# Patient Record
Sex: Female | Born: 1950
Health system: Southern US, Community
[De-identification: ages and names within clinical notes are randomized; demographics above are authoritative.]

## PROBLEM LIST (undated history)

## (undated) DIAGNOSIS — T8859XA Other complications of anesthesia, initial encounter: Secondary | ICD-10-CM

## (undated) DIAGNOSIS — Z9889 Other specified postprocedural states: Secondary | ICD-10-CM

## (undated) DIAGNOSIS — Z8489 Family history of other specified conditions: Secondary | ICD-10-CM

## (undated) DIAGNOSIS — T4145XA Adverse effect of unspecified anesthetic, initial encounter: Secondary | ICD-10-CM

## (undated) DIAGNOSIS — E079 Disorder of thyroid, unspecified: Secondary | ICD-10-CM

## (undated) DIAGNOSIS — I341 Nonrheumatic mitral (valve) prolapse: Secondary | ICD-10-CM

## (undated) DIAGNOSIS — R112 Nausea with vomiting, unspecified: Secondary | ICD-10-CM

## (undated) DIAGNOSIS — Z953 Presence of xenogenic heart valve: Secondary | ICD-10-CM

## (undated) DIAGNOSIS — D591 Other autoimmune hemolytic anemias: Secondary | ICD-10-CM

## (undated) DIAGNOSIS — J449 Chronic obstructive pulmonary disease, unspecified: Secondary | ICD-10-CM

## (undated) DIAGNOSIS — E039 Hypothyroidism, unspecified: Secondary | ICD-10-CM

## (undated) DIAGNOSIS — R011 Cardiac murmur, unspecified: Secondary | ICD-10-CM

## (undated) DIAGNOSIS — A64 Unspecified sexually transmitted disease: Secondary | ICD-10-CM

## (undated) DIAGNOSIS — T7840XA Allergy, unspecified, initial encounter: Secondary | ICD-10-CM

## (undated) DIAGNOSIS — I781 Nevus, non-neoplastic: Secondary | ICD-10-CM

## (undated) DIAGNOSIS — C3490 Malignant neoplasm of unspecified part of unspecified bronchus or lung: Secondary | ICD-10-CM

## (undated) DIAGNOSIS — J45909 Unspecified asthma, uncomplicated: Secondary | ICD-10-CM

## (undated) DIAGNOSIS — I7300 Raynaud's syndrome without gangrene: Secondary | ICD-10-CM

## (undated) DIAGNOSIS — IMO0001 Reserved for inherently not codable concepts without codable children: Secondary | ICD-10-CM

## (undated) DIAGNOSIS — I5032 Chronic diastolic (congestive) heart failure: Secondary | ICD-10-CM

## (undated) DIAGNOSIS — I340 Nonrheumatic mitral (valve) insufficiency: Secondary | ICD-10-CM

## (undated) DIAGNOSIS — I6529 Occlusion and stenosis of unspecified carotid artery: Secondary | ICD-10-CM

## (undated) HISTORY — DX: Nonrheumatic mitral (valve) insufficiency: I34.0

## (undated) HISTORY — DX: Unspecified asthma, uncomplicated: J45.909

## (undated) HISTORY — DX: Malignant neoplasm of unspecified part of unspecified bronchus or lung: C34.90

## (undated) HISTORY — DX: Cardiac murmur, unspecified: R01.1

## (undated) HISTORY — DX: Disorder of thyroid, unspecified: E07.9

## (undated) HISTORY — DX: Allergy, unspecified, initial encounter: T78.40XA

## (undated) HISTORY — DX: Unspecified sexually transmitted disease: A64

## (undated) HISTORY — PX: LUNG CANCER SURGERY: SHX702

## (undated) HISTORY — PX: BREAST BIOPSY: SHX20

## (undated) HISTORY — PX: TONSILLECTOMY: SUR1361

## (undated) HISTORY — DX: Occlusion and stenosis of unspecified carotid artery: I65.29

## (undated) HISTORY — DX: Raynaud's syndrome without gangrene: I73.00

## (undated) HISTORY — DX: Nonrheumatic mitral (valve) prolapse: I34.1

## (undated) HISTORY — PX: BACK SURGERY: SHX140

## (undated) HISTORY — PX: LAPAROSCOPY: SHX197

---

## 2011-05-21 HISTORY — PX: CLAVICLE SURGERY: SHX598

## 2013-05-20 HISTORY — PX: OTHER SURGICAL HISTORY: SHX169

## 2014-08-03 ENCOUNTER — Encounter (HOSPITAL_COMMUNITY): Payer: Self-pay | Admitting: *Deleted

## 2014-08-03 ENCOUNTER — Emergency Department (INDEPENDENT_AMBULATORY_CARE_PROVIDER_SITE_OTHER)
Admission: EM | Admit: 2014-08-03 | Discharge: 2014-08-03 | Disposition: A | Discharge status: Home / Self Care | Payer: PRIVATE HEALTH INSURANCE | Source: Home / Self Care | Attending: Family Medicine | Admitting: Family Medicine

## 2014-08-03 DIAGNOSIS — Z20828 Contact with and (suspected) exposure to other viral communicable diseases: Secondary | ICD-10-CM

## 2014-08-03 DIAGNOSIS — I889 Nonspecific lymphadenitis, unspecified: Secondary | ICD-10-CM

## 2014-08-03 NOTE — ED Notes (Signed)
Pt  Reports    Symptoms   Of        Swelling  r   Side  Neck  Tender   Behind    r  Ear      -  denys  Any  sorethroat         denys  Any  Injury    -   Pt  Is  Sitting   Upright on  Exam table  Speaking in  Complete  sentances       And     Is  In     No  Severe   Distress

## 2014-08-03 NOTE — Discharge Instructions (Signed)
Continue to apply ice packs and take ibuprofen as needed for pain. If this gets worse or if you start to get sick, or does not resolve in a month, follow-up here or with your primary care provider  Cervical Adenitis You have a swollen lymph gland in your neck. This commonly happens with Strep and virus infections, dental problems, insect bites, and injuries about the face, scalp, or neck. The lymph glands swell as the body fights the infection or heals the injury. Swelling and firmness typically lasts for several weeks after the infection or injury is healed. Rarely lymph glands can become swollen because of cancer or TB. Antibiotics are prescribed if there is evidence of an infection. Sometimes an infected lymph gland becomes filled with pus. This condition may require opening up the abscessed gland by draining it surgically. Most of the time infected glands return to normal within two weeks. Do not poke or squeeze the swollen lymph nodes. That may keep them from shrinking back to their normal size. If the lymph gland is still swollen after 2 weeks, further medical evaluation is needed.  SEEK IMMEDIATE MEDICAL CARE IF:  You have difficulty swallowing or breathing, increased swelling, severe pain, or a high fever.  Document Released: 05/06/2005 Document Revised: 07/29/2011 Document Reviewed: 10/26/2006 [facility] Patient Information 2015 [place], [place]. This information is not intended to replace advice given to you by your health care provider. Make sure you discuss any questions you have with your health care provider.

## 2014-08-03 NOTE — ED Provider Notes (Signed)
CSN: [id number]     Arrival date & time 08/03/14  1253 History   First MD Initiated Contact with Patient 08/03/14 1429     Chief Complaint  Patient presents with  . Lymphadenopathy   (Consider location/radiation/quality/duration/timing/severity/associated sxs/prior Treatment) HPI      64 year old female presents complaining of a tender swollen lymph node in the right side of her neck. This started yesterday. It was very large, she says it was about the size of a cough off and extremely painful. She applied an ice pack and has gotten better today. She denies any systemic symptoms. She has recent exposure to her granddaughter who has influenza B. She denies fever, chills, NVD, trismus. No systemic symptoms whatsoever  Past Medical History  Diagnosis Date  . CREST (calcinosis, Raynaud's phenomenon, esophageal dysfunction, sclerodactyly, telangiectasia)    Past Surgical History  Procedure Laterality Date  . Back surgery     History reviewed. No pertinent family history. History  Substance Use Topics  . Smoking status: Current Every Day Smoker  . Smokeless tobacco: Not on file  . Alcohol Use: Yes   OB History    No data available     Review of Systems  Hematological: Positive for adenopathy.  All other systems reviewed and are negative.   Allergies  Penicillins  Home Medications   Prior to Admission medications   Medication Sig Start Date End Date Taking? Authorizing Provider  BABY ASPIRIN PO Take by mouth.   Yes Historical Provider, MD  Levothyroxine Sodium (LEVOTHROID PO) Take by mouth.   Yes Historical Provider, MD  Multiple Vitamins-Minerals (MULTI VITAMIN/MINERALS PO) Take by mouth.   Yes Historical Provider, MD  Omega-3 Fatty Acids (FISH OIL PO) Take by mouth.   Yes Historical Provider, MD  Tiotropium Bromide Monohydrate (SPIRIVA HANDIHALER IN) Inhale into the lungs.   Yes Historical Provider, MD   BP 133/70 mmHg  Pulse 73  Temp(Src) 97.4 F (36.3 C) (Oral)  Resp  16  SpO2 100% Physical Exam  Constitutional: She is oriented to person, place, and time. Vital signs are normal. She appears well-developed and well-nourished. No distress.  HENT:  Head: Normocephalic and atraumatic.  Right Ear: Tympanic membrane, external ear and ear canal normal.  Left Ear: Tympanic membrane, external ear and ear canal normal.  Nose: Nose normal. Right sinus exhibits no maxillary sinus tenderness and no frontal sinus tenderness. Left sinus exhibits no maxillary sinus tenderness and no frontal sinus tenderness.  Mouth/Throat: Oropharynx is clear and moist. No oropharyngeal exudate.  Pulmonary/Chest: Effort normal. No respiratory distress.  Lymphadenopathy:       Head (right side): Tonsillar adenopathy present. No preauricular and no posterior auricular adenopathy present.       Head (left side): No tonsillar, no preauricular and no posterior auricular adenopathy present.    She has cervical adenopathy.       Right cervical: No superficial cervical adenopathy present.      Left cervical: No superficial cervical and no deep cervical adenopathy present.  No mastoid tenderness  Neurological: She is alert and oriented to person, place, and time. She has normal strength. Coordination normal.  Skin: Skin is warm and dry. No rash noted. She is not diaphoretic.  Psychiatric: She has a normal mood and affect. Judgment normal.  Nursing note and vitals reviewed.   ED Course  Procedures (including critical care time) Labs Review Labs Reviewed - No data to display  Imaging Review No results found.   MDM   1. Cervical  lymphadenitis   2. Exposure to influenza    Isolated lymphadenopathy, advised continue treatment with ice as well as ibuprofen, return precautions were discussed. No antibiotics indicated at this time.    Liam Graham, PA-C 08/03/14 1455

## 2014-09-27 ENCOUNTER — Encounter: Payer: Self-pay | Admitting: Internal Medicine

## 2014-10-03 ENCOUNTER — Ambulatory Visit (INDEPENDENT_AMBULATORY_CARE_PROVIDER_SITE_OTHER)
Admission: RE | Admit: 2014-10-03 | Discharge: 2014-10-03 | Disposition: A | Discharge status: Home / Self Care | Payer: PRIVATE HEALTH INSURANCE | Source: Ambulatory Visit | Attending: Internal Medicine | Admitting: Internal Medicine

## 2014-10-03 ENCOUNTER — Encounter: Payer: Self-pay | Admitting: Internal Medicine

## 2014-10-03 ENCOUNTER — Ambulatory Visit (INDEPENDENT_AMBULATORY_CARE_PROVIDER_SITE_OTHER): Payer: PRIVATE HEALTH INSURANCE | Admitting: Internal Medicine

## 2014-10-03 VITALS — BP 92/60 | HR 76 | Ht 66.5 in | Wt 128.0 lb

## 2014-10-03 DIAGNOSIS — Z87891 Personal history of nicotine dependence: Secondary | ICD-10-CM | POA: Insufficient documentation

## 2014-10-03 DIAGNOSIS — M349 Systemic sclerosis, unspecified: Secondary | ICD-10-CM

## 2014-10-03 DIAGNOSIS — R918 Other nonspecific abnormal finding of lung field: Secondary | ICD-10-CM | POA: Diagnosis not present

## 2014-10-03 DIAGNOSIS — F17210 Nicotine dependence, cigarettes, uncomplicated: Secondary | ICD-10-CM

## 2014-10-03 DIAGNOSIS — Z720 Tobacco use: Secondary | ICD-10-CM

## 2014-10-03 DIAGNOSIS — J449 Chronic obstructive pulmonary disease, unspecified: Secondary | ICD-10-CM

## 2014-10-03 MED ORDER — MOMETASONE FURO-FORMOTEROL FUM 100-5 MCG/ACT IN AERO: INHALATION_SPRAY | RESPIRATORY_TRACT | Status: DC

## 2014-10-03 NOTE — Assessment & Plan Note (Addendum)
-   pfts 2014 s sign obst or even concavity to f/v loop   Symptoms are more of cough than doe at this point and not responding to maint spiriva  DDX of  difficult airways management all start with A and  include Adherence, Ace Inhibitors, Acid Reflux, Active Sinus Disease, Alpha 1 Antitripsin deficiency, Anxiety masquerading as Airways dz,  ABPA,  allergy(esp in young), Aspiration (esp in elderly), Adverse effects of DPI,  Active smokers, plus two Bs  = Bronchiectasis and Beta blocker use..and one C= CHF   Adherence is always the initial "prime suspect" and is a multilayered concern that requires a "trust but verify" approach in every patient - starting with knowing how to use medications, especially inhalers, correctly, keeping up with refills and understanding the fundamental difference between maintenance and prns vs those medications only taken for a very short course and then stopped and not refilled.  - The proper method of use, as well as anticipated side effects, of a metered-dose inhaler are discussed and demonstrated to the patient. Improved effectiveness after extensive coaching during this visit to a level of approximately  90% so try dulera 100 2bid  ? Acid (or non-acid) GERD > always difficult to exclude as up to 75% of pts in some series report no assoc GI/ Heartburn symptoms and she supposedly has CREST on fish oil which I asked her to reconsider  ? Adverse effects of spiriva > tyr off this since she doesn't have copd and on dulera 100 since she does have active bronchitis

## 2014-10-03 NOTE — Assessment & Plan Note (Signed)
>   3 min discussion I reviewed the Flethcher curve with patient that basically indicates  if you quit smoking when your best day FEV1 is still well preserved (as clearly is the case here)  it is highly unlikely you will progress to severe disease and informed the patient there was no medication on the market that has proven to change the curve or the likelihood of progression.  Therefore stopping smoking and maintaining abstinence is the most important aspect of care, not choice of inhalers or for that matter, doctors.

## 2014-10-03 NOTE — Assessment & Plan Note (Signed)
Referred to rheumatology

## 2014-10-03 NOTE — Patient Instructions (Addendum)
Stop spiriva and start >>> Dulera 100 Take 2 puffs first thing in am and then another 2 puffs about 12 hours later.  Fill the prescription if you like it, if not try off and see what difference it makes and if prefer spriva restart vs just work on smoking cessation and stop all the inhalers  Work on inhaler technique:  relax and gently blow all the way out then take a nice smooth deep breath back in, triggering the inhaler at same time you start breathing in.  Hold for up to 5 seconds if you can.  Rinse and gargle with water when done  Please see patient coordinator before you leave today  to schedule rheumatology evaluation   Please remember to go to the   x-ray department downstairs for your tests - we will call you with the results when they are available.    Please schedule a follow up office visit in 6 weeks, call sooner if needed with pft's on return

## 2014-10-03 NOTE — Progress Notes (Signed)
Subjective:    Patient ID: Joy Williams, female    DOB: 1950/11/06    MRN: [id number]  HPI  11 yowm active smoker dx with CREST 2004 in [place] moved to [facility] from [place] Nov 2015  self referred to pulmonary clinic for copd though last set of pfts from 2014 did not show copd but CT chest 12/2010 showed MPN's with neg PET    10/03/2014 1st [facility] office visit/ Eylin Pontarelli  On spiriva chronically  Chief Complaint  Patient presents with  . Pulmonary Consult    Self referral. Pt moved from [place] to [facility] in Nov 2015. She states that she was dxed with Crest Syndrome in her 67's or 30's.  She c/o "smoker's cough"- proc with clear sputum.  Her breathing is overall doing well.    cough is the main problem - can still do 5k racing power walk s stopping ie Not limited by breathing from desired activities  Takes fish oil for heart regurgitation and it "works great"    Cough is daily , def Worse in am's year round, present x > 5 y, worse since in [place]   No obvious other patterns in day to day or daytime variabilty or assoc cp or chest tightness, subjective wheeze overt sinus or hb symptoms. No unusual exp hx or h/o childhood pna/ asthma or knowledge of premature birth.  Sleeping ok without nocturnal  or early am exacerbation  of respiratory  c/o's or need for noct saba. Also denies any obvious fluctuation of symptoms with weather or environmental changes or other aggravating or alleviating factors except as outlined above   Current Medications, Allergies, Complete Past Medical History, Past Surgical History, Family History, and Social History were reviewed in [employer] record.             Review of Systems  Constitutional: Negative for fever, chills and unexpected weight change.  HENT: Negative for congestion, dental problem, ear pain, nosebleeds, postnasal drip, rhinorrhea, sinus pressure, sneezing, sore throat, trouble swallowing and voice change.   Eyes: Negative for  visual disturbance.  Respiratory: Positive for cough. Negative for choking and shortness of breath.   Cardiovascular: Negative for chest pain and leg swelling.  Gastrointestinal: Negative for vomiting, abdominal pain and diarrhea.  Genitourinary: Negative for difficulty urinating.  Musculoskeletal: Negative for arthralgias.  Skin: Negative for rash.  Neurological: Negative for tremors, syncope and headaches.  Hematological: Does not bruise/bleed easily.       Objective:   Physical Exam  amb thin wf nad  Wt Readings from Last 3 Encounters:  10/03/14 128 lb (58.06 kg)    Vital signs reviewed   HEENT: nl dentition, turbinates, and orophanx. Nl external ear canals without cough reflex   NECK :  without JVD/Nodes/TM/ nl carotid upstrokes bilaterally   LUNGS: no acc muscle use, clear to A and P bilaterally with  cough on  exp maneuvers   CV:  RRR  no s3 or murmur or increase in P2, no edema   ABD:  soft and nontender with nl excursion in the supine position. No bruits or organomegaly, bowel sounds nl  MS:  warm without deformities, calf tenderness, cyanosis or clubbing  SKIN: warm and dry with classic telangectasias esp face and volar surfaces both hands  NEURO:  alert, approp, no deficits     CXR PA and Lateral:   10/03/2014 :     I personally reviewed images and agree with radiology impression as follows:  Ill-defined stellate density noted projected over the left upper lobe. Question small peripheral nodule over the left mid lung. Contrast-enhanced chest CT is suggested for further evaluation.     Assessment & Plan:

## 2014-10-03 NOTE — Assessment & Plan Note (Signed)
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

## 2014-10-26 ENCOUNTER — Encounter: Payer: Self-pay | Admitting: *Deleted

## 2014-10-28 ENCOUNTER — Encounter: Payer: Self-pay | Admitting: Cardiology

## 2014-10-28 ENCOUNTER — Ambulatory Visit (INDEPENDENT_AMBULATORY_CARE_PROVIDER_SITE_OTHER): Payer: PRIVATE HEALTH INSURANCE | Admitting: Cardiology

## 2014-10-28 VITALS — BP 124/64 | HR 71 | Ht 66.5 in | Wt 125.0 lb

## 2014-10-28 DIAGNOSIS — Z720 Tobacco use: Secondary | ICD-10-CM

## 2014-10-28 DIAGNOSIS — I341 Nonrheumatic mitral (valve) prolapse: Secondary | ICD-10-CM | POA: Diagnosis not present

## 2014-10-28 DIAGNOSIS — I340 Nonrheumatic mitral (valve) insufficiency: Secondary | ICD-10-CM | POA: Diagnosis not present

## 2014-10-28 DIAGNOSIS — F17200 Nicotine dependence, unspecified, uncomplicated: Secondary | ICD-10-CM

## 2014-10-28 LAB — BASIC METABOLIC PANEL
BUN: 13 mg/dL (ref 6–23)
CO2: 30 mEq/L (ref 19–32)
Calcium: 9.8 mg/dL (ref 8.4–10.5)
Chloride: 100 mEq/L (ref 96–112)
Creatinine, Ser: 0.72 mg/dL (ref 0.40–1.20)
GFR: 86.76 mL/min (ref >60.00)
Glucose, Bld: 83 mg/dL (ref 70–99)
Potassium: 4.2 mEq/L (ref 3.5–5.1)
Sodium: 136 mEq/L (ref 135–145)

## 2014-10-28 LAB — CBC
HCT: 37.7 % (ref 36.0–46.0)
Hemoglobin: 12.4 g/dL (ref 12.0–15.0)
MCHC: 33 g/dL (ref 30.0–36.0)
MCV: 86.3 fl (ref 78.0–100.0)
Platelets: 297 10*3/uL (ref 150.0–400.0)
RBC: 4.36 Mil/uL (ref 3.87–5.11)
RDW: 13.6 % (ref 11.5–15.5)
WBC: 10.5 10*3/uL (ref 4.0–10.5)

## 2014-10-28 LAB — HEPATIC FUNCTION PANEL
ALT: 14 U/L (ref 0–35)
AST: 24 U/L (ref 0–37)
Albumin: 4.1 g/dL (ref 3.5–5.2)
Alkaline Phosphatase: 60 U/L (ref 39–117)
Bilirubin, Direct: 0 mg/dL (ref 0.0–0.3)
Total Bilirubin: 0.4 mg/dL (ref 0.2–1.2)
Total Protein: 7.2 g/dL (ref 6.0–8.3)

## 2014-10-28 LAB — TSH: TSH: 11.94 u[IU]/mL — ABNORMAL HIGH (ref 0.35–4.50)

## 2014-10-28 NOTE — Patient Instructions (Signed)
Medication Instructions:  None  Labwork: BMET, CBC, TSH, LFT and NMR Lipids today  Testing/Procedures: Your physician has requested that you have an echocardiogram. Echocardiography is a painless test that uses sound waves to create images of your heart. It provides your doctor with information about the size and shape of your heart and how well your heart's chambers and valves are working. This procedure takes approximately one hour. There are no restrictions for this procedure.    Follow-Up: Your physician wants you to follow-up in: 1 year with Dr. Meda Coffee. You will receive a reminder letter in the mail two months in advance. If you don't receive a letter, please call our office to schedule the follow-up appointment.   Any Other Special Instructions Will Be Listed Below (If Applicable).

## 2014-10-28 NOTE — Progress Notes (Signed)
Patient ID: Joy Williams, female   DOB: April 09, 1951, 64 y.o.   MRN: [id number]      Cardiology Office Note   Date:  10/28/2014   ID:  Joy Williams, DOB 04/22/1951, MRN [id number]  PCP:  Velna Hatchet, MD  Cardiologist:  Dorothy Spark, MD   Chief complain: mitral valve prolapse, severe mitral regurgitation   History of Present Illness: Joy Williams is a 64 y.o. female who presents to establish cardiology care. The patient moved here from [place]. She has h/o lifelong MVP and was told that she has severe MR but surgery is not indicated yet. He mother, brother and sister all had MVP and underwent MV surgery.  The patient is not exercising, but is able to perform all the ADL and has full time job that she is performing without any difficulties.  She denies palpitations, syncope, orthopnea, PND, LE edema, DOE, or chest pain.  She hasn't had her cholesterol checked in a while.   Past Medical History  Diagnosis Date  . CREST (calcinosis, Raynaud's phenomenon, esophageal dysfunction, sclerodactyly, telangiectasia)     Past Surgical History  Procedure Laterality Date  . Back surgery       Current Outpatient Prescriptions  Medication Sig Dispense Refill  . BABY ASPIRIN PO Take 1 tablet by mouth daily.     Marland Kitchen levothyroxine (SYNTHROID, LEVOTHROID) 175 MCG tablet Take 175 mcg by mouth daily before breakfast.    . mometasone-formoterol (DULERA) 100-5 MCG/ACT AERO Take 2 puffs first thing in am and then another 2 puffs about 12 hours later. 1 Inhaler 11  . Multiple Vitamins-Minerals (MULTI VITAMIN/MINERALS PO) Take 1 tablet by mouth daily.     . Omega-3 Fatty Acids (FISH OIL PO) Take 1 capsule by mouth daily.      No current facility-administered medications for this visit.   Allergies:   Penicillins   Social History:  The patient  reports that she has been smoking Cigarettes.  She has a 18 pack-year smoking history. She has never used smokeless tobacco. She reports that she  drinks alcohol.   Family History:  The patient's family history includes Dementia in her father; Mitral valve prolapse in her brother, mother, and sister; Prostate cancer in her father.    ROS:  Please see the history of present illness.     All other systems are reviewed and negative.    PHYSICAL EXAM: VS:  BP 124/64 mmHg  Pulse 71  Ht 5' 6.5" (1.689 m)  Wt 125 lb (56.7 kg)  BMI 19.88 kg/m2 , BMI Body mass index is 19.88 kg/(m^2). GEN: Well nourished, well developed, in no acute distress HEENT: normal Neck: no JVD, carotid bruits, or masses Cardiac: RRR; early systolic click followed by a loud murmur, rubs, or gallops,no edema  Respiratory:  clear to auscultation bilaterally, normal work of breathing GI: soft, nontender, nondistended, + BS MS: no deformity or atrophy Skin: warm and dry, no rash Neuro:  Strength and sensation are intact Psych: euthymic mood, full affect  EKG:  EKG: SR, normal ECG  Recent Labs: No results found for requested labs within last 365 days.   Lipid Panel No results found for: CHOL, TRIG, HDL, CHOLHDL, VLDL, LDLCALC, LDLDIRECT    Wt Readings from Last 3 Encounters:  10/28/14 125 lb (56.7 kg)  10/03/14 128 lb (58.06 kg)      Other studies Reviewed: Records requested  ASSESSMENT AND PLAN:  64 year old female  1. MVP, severe MR - we will perform TTE  to have a baseline study and to evaluate for LV size, LVEF< possible pulmonary hypertension. The patient is currently asymptomatic and if she has normal parameters, we will follow in 1 year.  2. Smoking - cessation counseling provided  3. Lipids - we will check today  Follow up in 1 year if no indication for MV surgery.  Signed, Dorothy Spark, MD  10/28/2014 9:40 AM    [facility] [place], [place], [place]  [address] Phone: [phone]; Fax: [phone]

## 2014-10-31 LAB — NMR LIPOPROFILE WITH LIPIDS
Cholesterol, Total: 177 mg/dL (ref 100–199)
HDL Particle Number: 35.9 umol/L (ref >=30.5)
HDL Size: 9.9 nm (ref >=9.2)
HDL-C: 70 mg/dL (ref >39)
LDL (calc): 84 mg/dL (ref 0–99)
LDL Particle Number: 1032 nmol/L — ABNORMAL HIGH (ref <1000)
LDL Size: 20.7 nm (ref >=20.8)
LP-IR Score: 26 (ref <=45)
Large HDL-P: 13.4 umol/L (ref >=4.8)
Large VLDL-P: 2.4 nmol/L (ref <=2.7)
Small LDL Particle Number: 334 nmol/L (ref <=527)
Triglycerides: 115 mg/dL (ref 0–149)
VLDL Size: 45.2 nm (ref <=46.6)

## 2014-11-04 ENCOUNTER — Other Ambulatory Visit: Payer: Self-pay

## 2014-11-04 ENCOUNTER — Ambulatory Visit (HOSPITAL_COMMUNITY): Payer: No Typology Code available for payment source | Attending: Cardiovascular Disease

## 2014-11-04 DIAGNOSIS — I341 Nonrheumatic mitral (valve) prolapse: Secondary | ICD-10-CM | POA: Diagnosis present

## 2014-11-04 DIAGNOSIS — I340 Nonrheumatic mitral (valve) insufficiency: Secondary | ICD-10-CM

## 2014-11-04 DIAGNOSIS — I517 Cardiomegaly: Secondary | ICD-10-CM | POA: Diagnosis not present

## 2014-11-11 ENCOUNTER — Telehealth: Payer: Self-pay | Admitting: Cardiology

## 2014-11-11 NOTE — Telephone Encounter (Signed)
New message      Pt called regarding test results.  Please advise.

## 2014-11-11 NOTE — Telephone Encounter (Signed)
Informed the pt that Dr Meda Coffee has not resulted her echo results yet,but this is in her basket for review and interpretation.  Informed the pt that once Dr Meda Coffee reviews her study, she will send this to me to follow-up with the pt to inform her of her results.  Pt verbalized understanding and agrees with this plan.

## 2014-11-11 NOTE — Telephone Encounter (Signed)
Please let her know that I will call her on Monday, she has severe MR, but I haven't had chance to talk to surgeon about her case yet.

## 2014-11-11 NOTE — Telephone Encounter (Signed)
Informed the pt that per Dr Meda Coffee she wanted me to let her know that she will call her on Monday 6/27 and go over more in depth her echo results and further recommendations. Informed the pt that per Dr Meda Coffee she has severe MR.  Provided brief pt education on what MR is.  Pt verbalized understanding and agrees with this plan.

## 2014-11-14 ENCOUNTER — Ambulatory Visit (INDEPENDENT_AMBULATORY_CARE_PROVIDER_SITE_OTHER)
Admission: RE | Admit: 2014-11-14 | Discharge: 2014-11-14 | Disposition: A | Discharge status: Home / Self Care | Payer: PRIVATE HEALTH INSURANCE | Source: Ambulatory Visit | Attending: Internal Medicine | Admitting: Internal Medicine

## 2014-11-14 ENCOUNTER — Encounter: Payer: Self-pay | Admitting: Internal Medicine

## 2014-11-14 ENCOUNTER — Ambulatory Visit (INDEPENDENT_AMBULATORY_CARE_PROVIDER_SITE_OTHER): Payer: PRIVATE HEALTH INSURANCE | Admitting: Internal Medicine

## 2014-11-14 ENCOUNTER — Telehealth: Payer: Self-pay | Admitting: Internal Medicine

## 2014-11-14 VITALS — BP 94/60 | HR 66 | Ht 66.5 in | Wt 127.0 lb

## 2014-11-14 DIAGNOSIS — M349 Systemic sclerosis, unspecified: Secondary | ICD-10-CM | POA: Diagnosis not present

## 2014-11-14 DIAGNOSIS — R918 Other nonspecific abnormal finding of lung field: Secondary | ICD-10-CM

## 2014-11-14 DIAGNOSIS — J449 Chronic obstructive pulmonary disease, unspecified: Secondary | ICD-10-CM | POA: Diagnosis not present

## 2014-11-14 LAB — PULMONARY FUNCTION TEST
DL/VA % pred: 65 %
DL/VA: 3.35 ml/min/mmHg/L
DLCO unc % pred: 52 %
DLCO unc: 14.61 ml/min/mmHg
FEF 25-75 PRE: 2.13 L/s
FEF 25-75 Post: 2.1 L/sec
FEF2575-%Change-Post: -1 %
FEF2575-%Pred-Post: 90 %
FEF2575-%Pred-Pre: 91 %
FEV1-%CHANGE-POST: 0 %
FEV1-%PRED-POST: 86 %
FEV1-%Pred-Pre: 86 %
FEV1-Post: 2.33 L
FEV1-Pre: 2.33 L
FEV1FVC-%Change-Post: -1 %
FEV1FVC-%Pred-Pre: 101 %
FEV6-%Change-Post: 1 %
FEV6-%PRED-PRE: 87 %
FEV6-%Pred-Post: 88 %
FEV6-POST: 2.98 L
FEV6-PRE: 2.95 L
FEV6FVC-%CHANGE-POST: 0 %
FEV6FVC-%Pred-Post: 103 %
FEV6FVC-%Pred-Pre: 104 %
FVC-%Change-Post: 0 %
FVC-%PRED-PRE: 84 %
FVC-%Pred-Post: 85 %
FVC-PRE: 2.97 L
FVC-Post: 3 L
POST FEV6/FVC RATIO: 100 %
PRE FEV6/FVC RATIO: 100 %
Post FEV1/FVC ratio: 78 %
Pre FEV1/FVC ratio: 78 %
RV % PRED: 88 %
RV: 1.93 L
TLC % PRED: 89 %
TLC: 4.88 L

## 2014-11-14 NOTE — Progress Notes (Signed)
PFT done today. 

## 2014-11-14 NOTE — Progress Notes (Signed)
Subjective:    Patient ID: Joy Williams, female    DOB: 12/25/1950    MRN: [id number]    Brief patient profile:  23 yowm active smoker dx with CREST 2004 in [place] moved to [place] from [place] Nov 2015  self referred to pulmonary clinic 10/03/14  for copd though last set of pfts from 2014 did not show copd but CT chest 12/2010 showed MPN's with neg PET at that point     History of Present Illness  10/03/2014 1st [facility] office visit/ Geraldo Haris  On spiriva chronically  Chief Complaint  Patient presents with  . Pulmonary Consult    Self referral. Pt moved from [place] to [facility] in Nov 2015. 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  She c/o "smoker's cough"- proc with clear sputum.  Her breathing is overall doing well.    cough is the main problem - can still do 5k racing power walk s stopping ie Not limited by breathing from desired activities  Takes fish oil for heart regurgitation and it "works great"  Cough is daily , def Worse in am's year round, present x > 5 y, worse since in [place]  rec Stop spiriva and start >>> Dulera 100 Take 2 puffs first thing in am and then another 2 puffs about 12 hours later.  Fill the prescription if you like it, if not try off and see what difference it makes and if prefer spriva restart vs just work on smoking cessation and stop all the inhalers Work on inhaler technique:   Please see patient coordinator before you leave today  to schedule rheumatology evaluation > done tr[place]low    11/14/2014 f/u ov/Haruki Arnold re: GOLD 0 copd/ dulera 100 > breathing/cough much better than spirva Chief Complaint  Patient presents with  . Follow-up    PFT done today. Pt states that her cough has improved and breathing is doing better. No new co's today.   can now walk fast up hills s stopping but does get sob then   No obvious day to day or daytime variabilty or assoc chronic cough or cp or chest tightness, subjective wheeze overt sinus or hb symptoms. No  unusual exp hx or h/o childhood pna/ asthma or knowledge of premature birth.  Sleeping ok without nocturnal  or early am exacerbation  of respiratory  c/o's or need for noct saba. Also denies any obvious fluctuation of symptoms with weather or environmental changes or other aggravating or alleviating factors except as outlined above   Current Medications, Allergies, Complete Past Medical History, Past Surgical History, Family History, and Social History were reviewed in [employer] record.  ROS  The following are not active complaints unless bolded sore throat, dysphagia, dental problems, itching, sneezing,  nasal congestion or excess/ purulent secretions, ear ache,   fever, chills, sweats, unintended wt loss, pleuritic or exertional cp, hemoptysis,  orthopnea pnd or leg swelling, presyncope, palpitations, abdominal pain, anorexia, nausea, vomiting, diarrhea  or change in bowel or urinary habits, change in stools or urine, dysuria,hematuria,  rash, arthralgias, visual complaints, headache, numbness weakness or ataxia or problems with walking or coordination,  change in mood/affect or memory.              Objective:   Physical Exam  amb thin wf nad  Wt Readings from Last 3 Encounters:  11/14/14 127 lb (57.607 kg)  10/28/14 125 lb (56.7 kg)  10/03/14 128 lb (58.06 kg)  Vital signs reviewed   HEENT: nl dentition, turbinates, and orophanx. Nl external ear canals without cough reflex   NECK :  without JVD/Nodes/TM/ nl carotid upstrokes bilaterally   LUNGS: no acc muscle use, clear to A and P bilaterally     CV:  RRR  no s3 or murmur or increase in P2, no edema   ABD:  soft and nontender with nl excursion in the supine position. No bruits or organomegaly, bowel sounds nl  MS:  warm without deformities, calf tenderness, cyanosis or clubbing  SKIN: warm and dry with classic telangectasias esp face and volar surfaces both hands  NEURO:  alert, approp, no  deficits        CXR PA and Lateral:   11/14/2014 :     I personally reviewed images and agree with radiology impression as follows:    Left upper lobe nodular density, highly suspicious for primary bronchogenic carcinoma.      Assessment & Plan:

## 2014-11-14 NOTE — Patient Instructions (Signed)
Please remember to go to the  x-ray department downstairs for your tests - we will call you with the results when they are available.  The key is to stop smoking completely before smoking completely stops you - it's clearly not too late  Please schedule a follow up visit in 6  months but call sooner if needed

## 2014-11-14 NOTE — Telephone Encounter (Signed)
See result note.  

## 2014-11-14 NOTE — Telephone Encounter (Signed)
CXR report in epic. FYI for MW

## 2014-11-15 ENCOUNTER — Telehealth: Payer: Self-pay | Admitting: Cardiology

## 2014-11-15 ENCOUNTER — Other Ambulatory Visit: Payer: Self-pay | Admitting: Internal Medicine

## 2014-11-15 DIAGNOSIS — I340 Nonrheumatic mitral (valve) insufficiency: Secondary | ICD-10-CM

## 2014-11-15 DIAGNOSIS — R918 Other nonspecific abnormal finding of lung field: Secondary | ICD-10-CM

## 2014-11-15 HISTORY — DX: Nonrheumatic mitral (valve) insufficiency: I34.0

## 2014-11-15 NOTE — Telephone Encounter (Signed)
Contacted the pt to inform her that per Dr Meda Coffee there is no need for surgery right now, based on her echo results, we will follow in 1 year with a repeat echo. Informed the pt that I will send a message to [facility] to call the pt, and have her repeat echo scheduled for one year out for mitral regurgitation.  Pt verbalized understanding and agrees with this plan.

## 2014-11-15 NOTE — Progress Notes (Signed)
Quick Note:  Spoke with pt and notified of results per Dr. Wert. Pt verbalized understanding and denied any questions.  ______ 

## 2014-11-15 NOTE — Telephone Encounter (Signed)
-----   Message from Dorothy Spark, MD sent at 11/15/2014  8:37 AM EDT ----- No need for surgery right now, we will follow in 1 year with repeat echocardiogram. Please let her know and schedule the echo. Thank you, KN

## 2014-11-15 NOTE — Telephone Encounter (Signed)
New Message  Pt called req a call back to discuss the results of the ECHO completed on 06/17. She states that she has heard breifly from the nurse and req a call back from Dr. Meda Coffee. Please assist

## 2014-11-16 ENCOUNTER — Ambulatory Visit (AMBULATORY_SURGERY_CENTER): Payer: Self-pay | Admitting: *Deleted

## 2014-11-16 ENCOUNTER — Encounter: Payer: Self-pay | Admitting: Internal Medicine

## 2014-11-16 ENCOUNTER — Ambulatory Visit (INDEPENDENT_AMBULATORY_CARE_PROVIDER_SITE_OTHER)
Admission: RE | Admit: 2014-11-16 | Discharge: 2014-11-16 | Disposition: A | Discharge status: Home / Self Care | Payer: PRIVATE HEALTH INSURANCE | Source: Ambulatory Visit | Attending: Internal Medicine | Admitting: Internal Medicine

## 2014-11-16 VITALS — Ht 66.5 in | Wt 126.8 lb

## 2014-11-16 DIAGNOSIS — Z1211 Encounter for screening for malignant neoplasm of colon: Secondary | ICD-10-CM

## 2014-11-16 DIAGNOSIS — R918 Other nonspecific abnormal finding of lung field: Secondary | ICD-10-CM

## 2014-11-16 MED ORDER — IOHEXOL 300 MG/ML  SOLN
80.0000 mL | Freq: Once | INTRAMUSCULAR | Status: AC | PRN
  Administered 2014-11-16: 80 mL via INTRAVENOUS

## 2014-11-16 MED ORDER — NA SULFATE-K SULFATE-MG SULF 17.5-3.13-1.6 GM/177ML PO SOLN: 1.0000 | Freq: Once | ORAL | Status: DC

## 2014-11-16 NOTE — Assessment & Plan Note (Addendum)
-  12/16/2012  s sign obst or even concavity to f/v loop  - 10/03/2014 p extensive coaching HFA effectiveness =    90% > try dulera 100 2bid  - PFTs 11/14/14 on dulera FEV1  2.33 (86%) ratio 78% and no change p saba and dlco 52% corrects to 65%   Despite active smoking her symptoms can be controlled with dulera 100 2bid > continue    I had an extended discussion with the patient reviewing all relevant studies completed to date and  lasting 15 to 20 minutes of a 25 minute visit    Each maintenance medication was reviewed in detail including most importantly the difference between maintenance and prns and under what circumstances the prns are to be triggered using an action plan format that is not reflected in the computer generated alphabetically organized AVS.    Please see instructions for details which were reviewed in writing and the patient given a copy highlighting the part that I personally wrote and discussed at today's ov.    I reviewed the Fletcher curve with the patient that basically indicates  if you quit smoking when your best day FEV1 is still well preserved (as is clearly  the case here)  it is highly unlikely you will progress to severe disease and informed the patient there was no medication on the market that has proven to alter the curve/ its downward trajectory  or the likelihood of progression of their disease.  Therefore stopping smoking and maintaining abstinence is the most important aspect of care, not choice of inhalers or for that matter, doctors.

## 2014-11-16 NOTE — Progress Notes (Signed)
No egg or soy allergy No issues with past sedation No home 02 No diet pills emmi declined

## 2014-11-16 NOTE — Assessment & Plan Note (Addendum)
-   CT/PET eval 2012 [place] Neg with Ca granuloma in LUL  - CT 11/16/2014 > Left upper and left lower lobe pulmonary nodules, suspicious for synchronous primary bronchogenic carcinomas. The left upper lobe nodule has soft tissue extension to the anterior left pleural space, which could be tumor extension or concurrent scarring. 2. Multiple AP window nodes, upper normal in size. Given location, suspicious. Similarly, left infrahilar nodes for which nodal metastasis cannot be excluded. 3. Consider PET to direct tissue sampling. 4. Emphysema. Question mild interstitial lung disease such as nonspecific interstitial pneumonitis. 5. Other smaller pulmonary nodules which are indeterminate   Discussed in detail all the  indications, usual  risks and alternatives  relative to the benefits with patient who agrees to proceed with PET scan repeat to see which if any of these lesions are metabolically active now and help direct a best approach for bx

## 2014-11-17 ENCOUNTER — Telehealth: Payer: Self-pay | Admitting: Internal Medicine

## 2014-11-17 DIAGNOSIS — R918 Other nonspecific abnormal finding of lung field: Secondary | ICD-10-CM

## 2014-11-17 NOTE — Progress Notes (Signed)
Quick Note:  lmtcb for pt. ______ 

## 2014-11-17 NOTE — Telephone Encounter (Signed)
Result Note     Call patient : needs PET next (she knows the scan shows new nodules)   Pt is aware and PET ordered. Nothing further needed

## 2014-11-23 ENCOUNTER — Telehealth: Payer: Self-pay | Admitting: Internal Medicine

## 2014-11-23 ENCOUNTER — Ambulatory Visit (HOSPITAL_COMMUNITY)
Admission: RE | Admit: 2014-11-23 | Discharge: 2014-11-23 | Disposition: A | Discharge status: Home / Self Care | Payer: No Typology Code available for payment source | Source: Ambulatory Visit | Attending: Internal Medicine | Admitting: Internal Medicine

## 2014-11-23 DIAGNOSIS — R918 Other nonspecific abnormal finding of lung field: Secondary | ICD-10-CM | POA: Diagnosis present

## 2014-11-23 DIAGNOSIS — R911 Solitary pulmonary nodule: Secondary | ICD-10-CM

## 2014-11-23 DIAGNOSIS — C3410 Malignant neoplasm of upper lobe, unspecified bronchus or lung: Secondary | ICD-10-CM | POA: Insufficient documentation

## 2014-11-23 LAB — GLUCOSE, CAPILLARY: Glucose-Capillary: 84 mg/dL (ref 65–99)

## 2014-11-23 MED ORDER — FLUDEOXYGLUCOSE F - 18 (FDG) INJECTION
6.3100 | Freq: Once | INTRAVENOUS | Status: AC | PRN
  Administered 2014-11-23: 6.31 via INTRAVENOUS

## 2014-11-23 NOTE — Telephone Encounter (Signed)
Pt is requesting results from the PET scan done today 11/23/14.

## 2014-11-23 NOTE — Telephone Encounter (Signed)
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

## 2014-11-24 ENCOUNTER — Telehealth: Payer: Self-pay | Admitting: Internal Medicine

## 2014-11-24 NOTE — Telephone Encounter (Signed)
Discussed again reasoning for t surgery eval for what is likely a resectable tumor

## 2014-11-24 NOTE — Telephone Encounter (Signed)
Order has been placed for referral. Nothing further was needed.

## 2014-11-24 NOTE — Telephone Encounter (Signed)
Spoke with pt, states she spoke with MW last night to review PET-was referred to thoracic surgery with Dr. Roxan Hockey.   Pt is wanting further explanation of radiologist reports: is it hot spots?  Are these tumors?  I see the report in Epic but MW spoke to pt himself so I do not see his interpretation to review with patient.  MW please advise.  Thanks!

## 2014-11-28 ENCOUNTER — Encounter: Payer: Self-pay | Admitting: Thoracic Surgery (Cardiothoracic Vascular Surgery)

## 2014-11-28 ENCOUNTER — Institutional Professional Consult (permissible substitution) (INDEPENDENT_AMBULATORY_CARE_PROVIDER_SITE_OTHER): Payer: PRIVATE HEALTH INSURANCE | Admitting: Thoracic Surgery (Cardiothoracic Vascular Surgery)

## 2014-11-28 VITALS — BP 106/66 | HR 72 | Resp 16 | Ht 66.5 in | Wt 128.0 lb

## 2014-11-28 DIAGNOSIS — M349 Systemic sclerosis, unspecified: Secondary | ICD-10-CM | POA: Diagnosis not present

## 2014-11-28 DIAGNOSIS — D381 Neoplasm of uncertain behavior of trachea, bronchus and lung: Secondary | ICD-10-CM

## 2014-11-28 DIAGNOSIS — I341 Nonrheumatic mitral (valve) prolapse: Secondary | ICD-10-CM | POA: Diagnosis not present

## 2014-11-28 NOTE — Progress Notes (Signed)
PCP is Velna Hatchet, MD Referring Provider is Tanda Rockers, MD  Chief Complaint  Patient presents with  . Lung Lesion    LUL...CT CHEST 6/29, PET 7/6, PFT. 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 She recently moved from [place] to [place]. She was establishing care with specialists here. She saw Dr. Melvyn Novas. Records indicated a history of lung nodules. A chest xray was done which showed a left upper lobe nodule. That led to a CT which showed multiple pulmonary nodules.  A PET/CT showed the left upper lobe nodule was markedly hypermetabolic with an SUV of 14. The left lower lobe nodule had an SUV of 3.7. There were multiple nodes that were mildly hypermetabolic.  She gets short of breath with heavy exertion but can walk a 5K. She has a frequent cough. Denies hemoptysis. Denies weight loss. She does not have wheezing.    Per report she had a negative PET in 2012.     Past Medical History  Diagnosis Date  . CREST (calcinosis, Raynaud's phenomenon, esophageal dysfunction, sclerodactyly, telangiectasia)     questionable  . MVP (mitral valve prolapse)   . Allergy   . Asthma   . Heart murmur     MVP  . Thyroid disease   . Raynaud's disease     Past Surgical History  Procedure Laterality Date  . Back surgery    . Clavicle surgery Left 2013    plate to left collar bone  . Wrist surgery Left 2014    plate to wrist   . Tonsillectomy    . Laparoscopy      ? reason-age 46     Family History  Problem Relation Age of Onset  . Mitral valve prolapse Mother   . Mitral valve prolapse Brother   . Mitral valve prolapse Sister   . Dementia Father   . Prostate cancer Father   . Colon cancer Neg Hx   . Colon polyps Neg Hx   . Rectal cancer Neg Hx   . Esophageal cancer Neg Hx   . Stomach cancer Neg Hx      Social History History  Substance Use Topics  . Smoking status: Current Every Day Smoker -- 0.75 packs/day for 24 years    Types: Cigarettes  . Smokeless tobacco: Never Used  . Alcohol Use: 0.0 oz/week    0 Standard drinks or equivalent per week     Comment: socially    Current Outpatient Prescriptions  Medication Sig Dispense Refill  . BABY ASPIRIN PO Take 1 tablet by mouth daily.     Marland Kitchen levothyroxine (SYNTHROID, LEVOTHROID) 175 MCG tablet Take 175 mcg by mouth daily before breakfast.    . mometasone-formoterol (DULERA) 100-5 MCG/ACT AERO Take 2 puffs first thing in am and then another 2 puffs about 12 hours later. 1 Inhaler 11  . Multiple Vitamins-Minerals (MULTI VITAMIN/MINERALS PO) Take 1 tablet by mouth daily.     . Omega-3 Fatty Acids (FISH OIL PO) Take 1 capsule by mouth daily.      No current facility-administered medications for this visit.    Allergies  Allergen Reactions  . Penicillins     Unknown, occurred as a child    Review of Systems  Constitutional: Negative for fever, chills, diaphoresis, activity change, appetite change, fatigue and  unexpected weight change.  HENT: Positive for congestion (allergies). Negative for hearing loss and nosebleeds.   Eyes: Negative for visual disturbance.       No double vision  Respiratory: Positive for cough and shortness of breath. Negative for wheezing.   Cardiovascular: Positive for palpitations. Negative for chest pain and leg swelling.       Mitral prolapse, Raynaud's  Genitourinary: Negative for dysuria and difficulty urinating.  Musculoskeletal: Positive for arthralgias.       Clavicle surgery  Skin:       telangectasias  Neurological: Positive for dizziness (1-2/ week).  Hematological: Negative for adenopathy. Does not bruise/bleed easily.  All other systems reviewed and are negative.   BP 106/66 mmHg  Pulse 72  Resp 16  Ht 5' 6.5" (1.689 m)  Wt 128 lb (58.06 kg)  BMI 20.35 kg/m2  SpO2 97% Physical Exam   Constitutional: She is oriented to person, place, and time. She appears well-developed and well-nourished. No distress.  HENT:  Head: Normocephalic and atraumatic.  telangectasias on cheeks  Eyes: EOM are normal. Pupils are equal, round, and reactive to light. Right eye exhibits no discharge.  Neck: Normal range of motion. Neck supple. No tracheal deviation present. No thyromegaly present.  Cardiovascular: Normal rate, regular rhythm and intact distal pulses.  Exam reveals no gallop and no friction rub.   Murmur (2/6 systolic) heard. Pulmonary/Chest: Effort normal and breath sounds normal. She has no wheezes. She has no rales.  Abdominal: Soft. Bowel sounds are normal. She exhibits no distension. There is no tenderness.  Musculoskeletal: Normal range of motion. She exhibits no edema.  Lymphadenopathy:    She has no cervical adenopathy.  Neurological: She is alert and oriented to person, place, and time. No cranial nerve deficit. Coordination normal.  Motor 5/5 bilaterally  Skin: Skin is warm and dry. Rash noted.  telangectasias  Vitals reviewed.    Diagnostic Tests: CT CHEST WITH CONTRAST  TECHNIQUE: Multidetector CT imaging of the chest was performed during intravenous contrast administration.  CONTRAST: 26m OMNIPAQUE IOHEXOL 300 MG/ML SOLN  COMPARISON: Plain films of 11/14/2014 and 10/03/2014.  FINDINGS: Mediastinum/Nodes: No supraclavicular adenopathy. Advanced aortic and branch vessel atherosclerosis. Mild cardiomegaly with mitral annular calcification and left atrial enlargement. No central pulmonary embolism, on this non-dedicated study. Multiple AP window nodes, including a 9 mm node on image 24 of series 2.  Right infrahilar node is upper normal at 7 mm but felt unlikely to be metastatic given location. Left infrahilar node measures 7 mm on image 33 of series 2.  Lungs/Pleura: No pleural fluid. Minimal subpleural reticular opacities are slightly upper  lobe predominant. dependent interstitial prominence in both lower lobes.  Mild centrilobular emphysema.  5 mm right middle lobe pulmonary nodule on image 34.  4 mm subpleural right middle lobe nodule on image 45.  Corresponding to the plain film abnormality, within the left apex, is a 1.5 x 2.0 cm nodule on image 18 of series 3. There is adjacent more anterior pulmonary opacity contacting the pleural space on image 17. No gross chest wall invasion.  Calcified granulomas, including within the lingula.  1.7 x 2.4 cm left lower lobe pulmonary nodule corresponds to the retrocardiac density on plain film.  Upper abdomen: Normal imaged portions of the liver, spleen, stomach, adrenal glands, left kidney.  Musculoskeletal: Left clavicular fixation. Mild osteopenia.  IMPRESSION: 1. Left upper and left lower lobe pulmonary nodules, suspicious for synchronous primary bronchogenic carcinomas. The left upper lobe nodule has soft  tissue extension to the anterior left pleural space, which could be tumor extension or concurrent scarring. 2. Multiple AP window nodes, upper normal in size. Given location, suspicious. Similarly, left infrahilar nodes for which nodal metastasis cannot be excluded. 3. Consider PET to direct tissue sampling. 4. Emphysema. Question mild interstitial lung disease such as nonspecific interstitial pneumonitis. 5. Other smaller pulmonary nodules which are indeterminate.   Electronically Signed  By: Abigail Miyamoto M.D.  On: 11/16/2014 15:25  NUCLEAR MEDICINE PET SKULL BASE TO THIGH  TECHNIQUE: 6.3 mCi F-18 FDG was injected intravenously. Full-ring PET imaging was performed from the skull base to thigh after the radiotracer. CT data was obtained and used for attenuation correction and anatomic localization.  FASTING BLOOD GLUCOSE: Value: 84 mg/dl  COMPARISON: Chest CT 11/16/2014.  FINDINGS: NECK  No areas of abnormal  hypermetabolism.  CHEST  Hypermetabolism which corresponds to the left upper lobe pulmonary nodule. This measures 1.9 x 1.3 cm and a S.U.V. max of 14.7 on image 24. More anterior portion, extending towards the left pleural space demonstrates low-level, nonspecific hypermetabolism.  Lower level hypermetabolism corresponding to the posterior medial left lower lobe pulmonary nodule. This measures 2.4 x 1.6 cm and a S.U.V. max of 3.7 on image 54.  The AP window nodes described on the prior exam demonstrate low-level non malignant range hypermetabolism. Example a S.U.V. max of 2.3.  There is hypermetabolism which corresponds to mild left suprahilar nodal tissue on the prior diagnostic CT. This measures a S.U.V. max of 3.3, including on image 77.  Low-level hypermetabolism corresponding to dependent bibasilar subpleural interstitial prominence.  ABDOMEN/PELVIS  No areas of abnormal hypermetabolism.  SKELETON  No abnormal marrow activity.  CT IMAGES PERFORMED FOR ATTENUATION CORRECTION  Bilateral carotid atherosclerosis. Remote left clavicular trauma with internal fixation. Chest findings deferred to recent diagnostic CT. Cardiomegaly. No pleural fluid. Calcified left upper lobe granuloma. Aortic and branch vessel atherosclerosis. Normal adrenal glands. Colonic diverticulosis. Aortic atherosclerosis. 3.3 cm left pelvic/adnexal fluid density lesion on image 183. Mild pelvic floor laxity.  IMPRESSION: 1. Hypermetabolic left upper lobe pulmonary nodule, consistent with primary bronchogenic carcinoma. 2. A left lower lobe pulmonary nodule demonstrates low-level hypermetabolism, and is also suspicious for primary bronchogenic carcinoma (synchronous). 3. AP window nodes described on the prior exam demonstrate low-level, non malignant range hypermetabolism. There is mild hypermetabolism corresponding to left suprahilar nodal tissue. Isolated nodal metastasis cannot  be excluded. 4. No extrathoracic metastatic disease identified. 5. Mild hypermetabolism corresponding to dependent bibasilar pulmonary opacity. Question interstitial lung disease such as nonspecific interstitial pneumonitis. 6. Fluid density left adnexal/pelvic lesion is likely a residual follicle. Pelvic ultrasound could be performed for further characterization.   Electronically Signed  By: Abigail Miyamoto M.D.  On: 11/23/2014 13:54  PFTs  FVC= 2.99(84%) FEV1= 2.33(86%) DLCO= 14.61(52%)  I personally reviewed the CT, PET and PFTs and concur with the official findings  Impression: 64 yo woman with 2 hypermetabolic nodules in the left lung, one in the upper lobe and one in the lower lobe. These are both suspicious for malignancy. She also has some hypermetabolic lymph nodes in the hilum and AP window.  I think we need to consider these to be malignant unless we can prove otherwise.   Options are CT guided biopsy, ENB/EBUS v VATS. I recommended we attempt to get more information with ENB/EBUS before taking Joy to surgery. If the lymph nodes are positive, she would need chemoradiation and we could avoid the morbidity of VATS.  I discussed the  general nature of the procedure with Joy Williams and Joy Williams. They understand this is a diagnostic procedure. I discussed the plan for general anesthesia. They understand we plan to do this as an outpatient. I discussed the indications, risks, benefits and alternatives. They understand the risks include but are not limited to nondiagnostic study, bleeding and pneumothorax.   She accepts the risks and agrees to proceed  Plan: END/ EBUS Wed 12/07/14 I spent 30 minutes with Joy Williams, > 50% in counseling Melrose Nakayama, MD [facility] [phone]

## 2014-11-29 ENCOUNTER — Other Ambulatory Visit: Payer: Self-pay

## 2014-11-29 ENCOUNTER — Telehealth: Payer: Self-pay | Admitting: Cardiology

## 2014-11-29 DIAGNOSIS — R918 Other nonspecific abnormal finding of lung field: Secondary | ICD-10-CM

## 2014-11-29 NOTE — Telephone Encounter (Signed)
New Message        Pt calling stating that Dr. Melvyn Novas had her get a Pet scan done and it is viewable in [place]. Pt would like for Dr. Meda Coffee to look at it and let her know what she thinks. Please call back and advise.

## 2014-11-29 NOTE — Telephone Encounter (Signed)
Pt calling wanting to inform Dr Meda Coffee that she recently had a PET scan done and read by Dr Melvyn Novas the pts Pulmonologist.  Per the pt she is requesting that Dr Meda Coffee review Dr Gustavus Bryant last OV note and review her recent PET scan results.  Pt was very anxious over the phone stating, "they think I have cancer." Pt has also been seen by Cardiothoracic Surgeon, Dr Roxan Hockey, for consultation of pulmonary nodules.  Pt states both Physician's are suspicious of possible malignancies found in her lungs.  Pt would like for Dr Meda Coffee to review Dr Hendrickson's last [place] note as well.  Informed the pt that Dr Meda Coffee is currently out of the office for the entire week, but she is still able to review messages sent.  Informed the pt that I will most definitely route this message to Dr Meda Coffee for further review and follow-up with the pt thereafter with any recommendations she might have.  Wished the pt the absolute best, provided therapeutic communication for reassurance.  Pt verbalized understanding and agrees with this plan.

## 2014-11-30 ENCOUNTER — Encounter: Payer: PRIVATE HEALTH INSURANCE | Admitting: Internal Medicine

## 2014-11-30 NOTE — Telephone Encounter (Signed)
Left message for the pt to call back to inform her that per Dr Meda Coffee, she agrees with Dr Roxan Hockey, and would like for the pt to follow with his recommendations.

## 2014-11-30 NOTE — Telephone Encounter (Signed)
I agree with Dr Roxan Hockey and would follow with his recommendations.

## 2014-12-01 NOTE — Telephone Encounter (Signed)
Informed the pt that per Dr Meda Coffee she agrees with Dr Roxan Hockey and would follow with his recommendations.  Pt verbalized understanding, agrees with this plan, and very gracious for all the assistance provided.

## 2014-12-02 ENCOUNTER — Inpatient Hospital Stay (HOSPITAL_COMMUNITY): Admission: RE | Admit: 2014-12-02 | Payer: No Typology Code available for payment source | Source: Ambulatory Visit

## 2014-12-05 ENCOUNTER — Ambulatory Visit (HOSPITAL_COMMUNITY)
Admission: RE | Admit: 2014-12-05 | Discharge: 2014-12-05 | Disposition: A | Discharge status: Home / Self Care | Payer: No Typology Code available for payment source | Source: Ambulatory Visit | Attending: Thoracic Surgery (Cardiothoracic Vascular Surgery) | Admitting: Thoracic Surgery (Cardiothoracic Vascular Surgery)

## 2014-12-05 ENCOUNTER — Encounter (HOSPITAL_COMMUNITY)
Admission: RE | Admit: 2014-12-05 | Discharge: 2014-12-05 | Disposition: A | Discharge status: Home / Self Care | Payer: No Typology Code available for payment source | Source: Ambulatory Visit | Attending: Thoracic Surgery (Cardiothoracic Vascular Surgery) | Admitting: Thoracic Surgery (Cardiothoracic Vascular Surgery)

## 2014-12-05 ENCOUNTER — Encounter (HOSPITAL_COMMUNITY): Payer: Self-pay

## 2014-12-05 ENCOUNTER — Other Ambulatory Visit: Payer: Self-pay

## 2014-12-05 VITALS — BP 90/49 | HR 76 | Temp 98.8°F | Resp 18 | Ht 66.5 in

## 2014-12-05 DIAGNOSIS — R918 Other nonspecific abnormal finding of lung field: Secondary | ICD-10-CM

## 2014-12-05 DIAGNOSIS — Z01818 Encounter for other preprocedural examination: Secondary | ICD-10-CM | POA: Insufficient documentation

## 2014-12-05 HISTORY — DX: Other specified postprocedural states: Z98.890

## 2014-12-05 HISTORY — DX: Other complications of anesthesia, initial encounter: T88.59XA

## 2014-12-05 HISTORY — DX: Family history of other specified conditions: Z84.89

## 2014-12-05 HISTORY — DX: Chronic obstructive pulmonary disease, unspecified: J44.9

## 2014-12-05 HISTORY — DX: Hypothyroidism, unspecified: E03.9

## 2014-12-05 HISTORY — DX: Adverse effect of unspecified anesthetic, initial encounter: T41.45XA

## 2014-12-05 HISTORY — DX: Reserved for inherently not codable concepts without codable children: IMO0001

## 2014-12-05 HISTORY — DX: Nausea with vomiting, unspecified: R11.2

## 2014-12-05 LAB — PROTIME-INR
INR: 1.05 (ref 0.00–1.49)
Prothrombin Time: 13.9 seconds (ref 11.6–15.2)

## 2014-12-05 LAB — COMPREHENSIVE METABOLIC PANEL
ALBUMIN: 3.5 g/dL (ref 3.5–5.0)
ALT: 16 U/L (ref 14–54)
AST: 26 U/L (ref 15–41)
Alkaline Phosphatase: 56 U/L (ref 38–126)
Anion gap: 8 (ref 5–15)
BUN: 19 mg/dL (ref 6–20)
CALCIUM: 8.9 mg/dL (ref 8.9–10.3)
CO2: 27 mmol/L (ref 22–32)
CREATININE: 0.89 mg/dL (ref 0.44–1.00)
Chloride: 102 mmol/L (ref 101–111)
Glucose, Bld: 96 mg/dL (ref 65–99)
POTASSIUM: 4.3 mmol/L (ref 3.5–5.1)
SODIUM: 137 mmol/L (ref 135–145)
TOTAL PROTEIN: 6.2 g/dL — AB (ref 6.5–8.1)
Total Bilirubin: 0.5 mg/dL (ref 0.3–1.2)

## 2014-12-05 LAB — CBC
HEMATOCRIT: 34.1 % — AB (ref 36.0–46.0)
Hemoglobin: 10.9 g/dL — ABNORMAL LOW (ref 12.0–15.0)
MCH: 28.2 pg (ref 26.0–34.0)
MCHC: 32 g/dL (ref 30.0–36.0)
MCV: 88.1 fL (ref 78.0–100.0)
Platelets: 241 10*3/uL (ref 150–400)
RBC: 3.87 MIL/uL (ref 3.87–5.11)
RDW: 13.5 % (ref 11.5–15.5)
WBC: 8.4 10*3/uL (ref 4.0–10.5)

## 2014-12-05 LAB — APTT: APTT: 28 s (ref 24–37)

## 2014-12-05 NOTE — Pre-Procedure Instructions (Signed)
    Jarae Nemmers  12/05/2014       Your procedure is scheduled on Wednesday, July 20.  Report to [facility] Admitting at 1045 A.M.  Call this number if you have problems the morning of surgery:  [phone]   Remember:  Do not eat food or drink liquids after midnight Tuesday,July 19.  Take these medicines the morning of surgery with A SIP OF WATER : levothyroxine (SYNTHROID, LEVOTHROID).                May  Use  DULERA.                  Stop taking Aleve, Multivitamin, Fish Oil.    Do not wear jewelry, make-up or nail polish.  Do not wear lotions, powders, or perfumes.    Do not shave 48 hours prior to surgery.    Do not bring valuables to the hospital.  [facility] is not responsible for any belongings or valuables.  Contacts, dentures or bridgework may not be worn into surgery.  Leave your suitcase in the car.  After surgery it may be brought to your room.  For patients admitted to the hospital, discharge time will be determined by your treatment team.  Patients discharged the day of surgery will not be allowed to drive home.   Name and phone number of your driver:   -  Special instructions:  Review  [facility] - Preparing For Surgery.  Please read over the following fact sheets that you were given. Pain Booklet, Coughing and Deep Breathing and Surgical Site Infection Prevention

## 2014-12-05 NOTE — Pre-Procedure Instructions (Signed)
    Joy Williams  12/05/2014       Your procedure is scheduled on Wednesday, July 20.  Report to [facility] Admitting at 12 A.M.  Call this number if you have problems the morning of surgery:  [phone]   Remember:  Do not eat food or drink liquids after midnight Tuesday,July 19.  Take these medicines the morning of surgery with A SIP OF WATER : levothyroxine (SYNTHROID, LEVOTHROID).                May  Use  DULERA.                  Stop taking Aleve, Multivitamin, Fish Oil.    Do not wear jewelry, make-up or nail polish.  Do not wear lotions, powders, or perfumes.    Do not shave 48 hours prior to surgery.    Do not bring valuables to the hospital.  [facility] is not responsible for any belongings or valuables.  Contacts, dentures or bridgework may not be worn into surgery.  Leave your suitcase in the car.  After surgery it may be brought to your room.  For patients admitted to the hospital, discharge time will be determined by your treatment team.  Patients discharged the day of surgery will not be allowed to drive home.   Name and phone number of your driver:   -  Special instructions:  Review  [facility] - Preparing For Surgery.  Please read over the following fact sheets that you were given. Pain Booklet, Coughing and Deep Breathing and Surgical Site Infection Prevention

## 2014-12-06 ENCOUNTER — Telehealth: Payer: Self-pay

## 2014-12-06 DIAGNOSIS — C3482 Malignant neoplasm of overlapping sites of left bronchus and lung: Secondary | ICD-10-CM | POA: Insufficient documentation

## 2014-12-06 DIAGNOSIS — R519 Headache, unspecified: Secondary | ICD-10-CM | POA: Insufficient documentation

## 2014-12-06 DIAGNOSIS — R51 Headache: Secondary | ICD-10-CM

## 2014-12-06 NOTE — Progress Notes (Signed)
Patient notified to arrive at 12:00 PM on day of surgery.  Patient verbalized understanding.

## 2014-12-06 NOTE — Telephone Encounter (Signed)
Patient called to cancel EBUS/ENB scheduled for 12/07/14 with Dr Roxan Hockey. She states that she saw Dr. Elenor Quinones @ [facility] today and he will manage her care now.

## 2014-12-07 ENCOUNTER — Encounter (HOSPITAL_COMMUNITY): Payer: Self-pay | Admitting: Anesthesiology

## 2014-12-07 ENCOUNTER — Encounter (HOSPITAL_COMMUNITY): Admission: RE | Payer: Self-pay | Source: Ambulatory Visit

## 2014-12-07 ENCOUNTER — Ambulatory Visit (HOSPITAL_COMMUNITY)
Admission: RE | Admit: 2014-12-07 | Payer: No Typology Code available for payment source | Source: Ambulatory Visit | Admitting: Thoracic Surgery (Cardiothoracic Vascular Surgery)

## 2014-12-07 SURGERY — VIDEO BRONCHOSCOPY WITH ENDOBRONCHIAL NAVIGATION
Anesthesia: General

## 2014-12-30 DIAGNOSIS — C3492 Malignant neoplasm of unspecified part of left bronchus or lung: Secondary | ICD-10-CM | POA: Insufficient documentation

## 2014-12-30 DIAGNOSIS — C3490 Malignant neoplasm of unspecified part of unspecified bronchus or lung: Secondary | ICD-10-CM

## 2014-12-30 HISTORY — PX: VIDEO ASSISTED THORACOSCOPY (VATS)/WEDGE RESECTION: SHX6174

## 2014-12-30 HISTORY — DX: Malignant neoplasm of unspecified part of unspecified bronchus or lung: C34.90

## 2015-01-19 DIAGNOSIS — I7300 Raynaud's syndrome without gangrene: Secondary | ICD-10-CM | POA: Insufficient documentation

## 2015-03-30 DIAGNOSIS — Z87898 Personal history of other specified conditions: Secondary | ICD-10-CM | POA: Insufficient documentation

## 2015-04-06 ENCOUNTER — Telehealth: Payer: Self-pay | Admitting: Internal Medicine

## 2015-04-06 NOTE — Telephone Encounter (Signed)
Pt due for ov with MW in Dec 2016 She is asking if this appt can wait until she is finished with chemo  Please

## 2015-04-07 NOTE — Telephone Encounter (Signed)
As long as she's doing will resp -wise that's fine - wish her the best

## 2015-04-07 NOTE — Telephone Encounter (Signed)
Right after lung surgery on 12/30/14, she had some issues with breathing.  She is still having some issues breathing, but she thinks that this is a side effect of the chemo.  Patient says that one of the side effects of the chemo is sob or labored breathing.  Patient notified that she can wait until January when she finished Chemo, however, I advised her that if she feels she is struggling to breath, she needs to call us so we can get her in for an appointment.  Patient agreed with this and says she will call our office to schedule appointment once Chemo is done or if needed prior to chemo completion. Nothing further needed. Closing encounter

## 2015-05-05 DIAGNOSIS — D6481 Anemia due to antineoplastic chemotherapy: Secondary | ICD-10-CM | POA: Insufficient documentation

## 2015-05-05 DIAGNOSIS — T451X5A Adverse effect of antineoplastic and immunosuppressive drugs, initial encounter: Secondary | ICD-10-CM

## 2015-05-21 HISTORY — PX: VOCAL CORD INJECTION: SHX2663

## 2015-06-07 ENCOUNTER — Encounter: Payer: Self-pay | Admitting: Internal Medicine

## 2015-06-07 ENCOUNTER — Ambulatory Visit (INDEPENDENT_AMBULATORY_CARE_PROVIDER_SITE_OTHER)
Admission: RE | Admit: 2015-06-07 | Discharge: 2015-06-07 | Disposition: A | Discharge status: Home / Self Care | Payer: No Typology Code available for payment source | Source: Ambulatory Visit | Attending: Internal Medicine | Admitting: Internal Medicine

## 2015-06-07 ENCOUNTER — Ambulatory Visit (INDEPENDENT_AMBULATORY_CARE_PROVIDER_SITE_OTHER): Payer: No Typology Code available for payment source | Admitting: Internal Medicine

## 2015-06-07 VITALS — BP 112/76 | HR 72 | Ht 67.0 in | Wt 134.0 lb

## 2015-06-07 DIAGNOSIS — J45909 Unspecified asthma, uncomplicated: Secondary | ICD-10-CM | POA: Diagnosis not present

## 2015-06-07 DIAGNOSIS — Z720 Tobacco use: Secondary | ICD-10-CM | POA: Diagnosis not present

## 2015-06-07 DIAGNOSIS — J449 Chronic obstructive pulmonary disease, unspecified: Secondary | ICD-10-CM

## 2015-06-07 DIAGNOSIS — F17210 Nicotine dependence, cigarettes, uncomplicated: Secondary | ICD-10-CM

## 2015-06-07 DIAGNOSIS — M349 Systemic sclerosis, unspecified: Secondary | ICD-10-CM

## 2015-06-07 NOTE — Patient Instructions (Signed)
For cough > mucinex dm 1200 mg every 12 hours as needed  Continue dulera 100 Take 2 puffs first thing in am and then another 2 puffs about 12 hours later.   Work on inhaler technique:  relax and gently blow all the way out then take a nice smooth deep breath back in, triggering the inhaler at same time you start breathing in.  Hold for up to 5 seconds if you can. Blow out thru nose. Rinse and gargle with water when done   The key is to stop smoking completely before smoking completely stops you!   Try prilosec otc '20mg'$   Take 30-60 min before first meal of the day and Pepcid ac (famotidine) 20 mg one @  bedtime until cough is completely gone for at least a week without the need for cough suppression     GERD (REFLUX)  is an extremely common cause of respiratory symptoms just like yours , many times with no obvious heartburn at all.    It can be treated with medication, but also with lifestyle changes including elevation of the head of your bed (ideally with 6 inch  bed blocks),  Smoking cessation, avoidance of late meals, excessive alcohol, and avoid fatty foods, chocolate, peppermint, colas, red wine, and acidic juices such as orange juice.  NO MINT OR MENTHOL PRODUCTS SO NO COUGH DROPS  USE SUGARLESS CANDY INSTEAD (Jolley ranchers or Stover's or Life Savers) or even ice chips will also do - the key is to swallow to prevent all throat clearing. NO OIL BASED VITAMINS - use powdered substitutes.  Please remember to go to the  x-ray department downstairs for your tests - we will call you with the results when they are available.      If you are satisfied with your treatment plan,  let your doctor know and he/she can either refill your medications or you can return here when your prescription runs out.     If in any way you are not 100% satisfied,  please tell us.  If 100% better, tell your friends!  Pulmonary follow up is as needed

## 2015-06-07 NOTE — Progress Notes (Signed)
Subjective:    Patient ID: Sevannah Madia, female    DOB: 07/23/1950    MRN: [id number]    Brief patient profile:  79 yowm active smoker dx with CREST 2004 in [place] moved to [place] from [place] Nov 2015  self referred to pulmonary clinic 10/03/14  for copd though last set of pfts from 2014 did not show copd    History of Present Illness  10/03/2014 1st  Pulmonary office visit/ Kidus Delman  On spiriva chronically  Chief Complaint  Patient presents with  . Pulmonary Consult    Self referral. Pt moved from [place] to [facility] in Nov 2015. She states that she was dxed with Crest Syndrome in her 49's or 30's.  She c/o "smoker's cough"- proc with clear sputum.  Her breathing is overall doing well.    cough is the main problem - can still do 5k racing power walk s stopping ie Not limited by breathing from desired activities  Takes fish oil for heart regurgitation and it "works great"  Cough is daily , def Worse in am's year round, present x > 5 y, worse since in [place]  rec Stop spiriva and start >>> Dulera 100 Take 2 puffs first thing in am and then another 2 puffs about 12 hours later.  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 Surgical resection with wedge resection and mediastinal lymph node sampling 12/2014  Records suggest  pneumonectomy done but clearly this is an error      06/07/2015  Extended f/u ov/Taneia Mealor re: transition of care  sob s/p L lung resection maint rx dulera 100 2bid  Chief Complaint  Patient presents with  . Follow-up    Pt c/o increased DOE since had lung surgery for lung ca in Sept 2016.   doe x  MMRC2 = can't walk a nl pace on a flat grade s sob/ assoc with day > noct cough/ no assoc dysphagia/ has not had any RT     No obvious day to day or daytime variabilty or assoc excess/ purulent sputum or mucus plugs  or cp or chest tightness, subjective wheeze overt sinus or hb symptoms. No unusual exp hx or h/o childhood pna/ asthma or knowledge of premature birth.  Sleeping ok without nocturnal  or early am exacerbation  of respiratory  c/o's or need for noct saba. Also denies any obvious fluctuation of symptoms with weather or environmental changes or other aggravating or alleviating factors except as outlined above   Current Medications, Allergies, Complete Past Medical History, Past Surgical History, Family History, and Social History were reviewed in [employer] record.  ROS  The following are not active complaints unless bolded sore throat, dysphagia, dental problems, itching, sneezing,  nasal congestion or excess/ purulent secretions, ear ache,   fever, chills, sweats, unintended wt  loss, pleuritic or exertional cp, hemoptysis,  orthopnea pnd or leg swelling, presyncope, palpitations, abdominal pain, anorexia, nausea, vomiting, diarrhea  or change in bowel or urinary habits, change in stools or urine, dysuria,hematuria,  rash, arthralgias, visual complaints, headache, numbness weakness or ataxia or problems with walking or coordination,  change in mood/affect or memory.              Objective:   Physical Exam  amb thin wf nad/ extremely hoarse    06/07/2015       134   11/14/14 127 lb (57.607 kg)  10/28/14 125 lb (56.7 kg)  10/03/14 128 lb (58.06 kg)    Vital  signs reviewed   HEENT: nl dentition, turbinates, and orophanx. Nl external ear canals without cough reflex   NECK :  without JVD/Nodes/TM/ nl carotid upstrokes bilaterally   LUNGS: no acc muscle use,  insp and exp rhonchi bilaterally    CV:  RRR  no s3 or murmur or increase in P2,  no edema   ABD:  soft and nontender with nl excursion in the supine position. No bruits or organomegaly, bowel sounds nl  MS:  warm without deformities, calf tenderness, cyanosis or clubbing  SKIN: warm and dry with classic telangectasias esp face and volar surfaces both hands  NEURO:  alert, approp, no deficits        CXR PA and Lateral:   11/14/2014 :     I personally reviewed images and agree with radiology impression as follows:    Left upper lobe nodular density, highly suspicious for primary bronchogenic carcinoma.      Assessment & Plan:

## 2015-06-08 NOTE — Assessment & Plan Note (Signed)
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

## 2015-06-08 NOTE — Assessment & Plan Note (Addendum)
-  12/16/2012  s sign obst or even concavity to f/v loop  - 10/03/2014 p extensive coaching HFA effectiveness =    90% > try dulera 100 2bid  - PFTs 11/14/14 on dulera FEV1  2.33 (86%) ratio 78% and no change p saba and dlco 52% corrects to 65%  - Spirometry 06/07/2015  FEV1 1.74 (67%)  Ratio 76    So most of her changes are restrictive s/p partial lung resection per [facility] records (not clear exactly how much lung removed but moot issue at this point.  However, she continues to suffer with daily  airway symptoms despite dulera 100 2bid   DDX of  difficult airways management almost all start with A and  include Adherence, Ace Inhibitors, Acid Reflux, Active Sinus Disease, Alpha 1 Antitripsin deficiency, Anxiety masquerading as Airways dz,  ABPA,  Allergy(esp in young), Aspiration (esp in elderly), Adverse effects of meds,  Active smokers, A bunch of PE's (a small clot burden can't cause this syndrome unless there is already severe underlying pulm or vascular dz with poor reserve) plus two Bs  = Bronchiectasis and Beta blocker use..and one C= CHF  Adherence is always the initial "prime suspect" and is a multilayered concern that requires a "trust but verify" approach in every patient - starting with knowing how to use medications, especially inhalers, correctly, keeping up with refills and understanding the fundamental difference between maintenance and prns vs those medications only taken for a very short course and then stopped and not refilled.  - The proper method of use, as well as anticipated side effects, of a metered-dose inhaler are discussed and demonstrated to the patient. Improved effectiveness after extensive coaching during this visit to a level of approximately 75 % from a baseline of 50 %> rec continue dulera 100 2bid  Active smoking > (see separate a/p)   ? Acid (or non-acid) GERD > always difficult to exclude as up to 75% of pts in some series report no assoc GI/ Heartburn symptoms> rec max  (24h)  acid suppression and diet restrictions/ reviewed and instructions given in writing.

## 2015-06-08 NOTE — Assessment & Plan Note (Signed)

## 2015-09-20 LAB — LIPID PANEL
CHOLESTEROL: 184 (ref 0–200)
HDL: 53 (ref 35–70)
LDL Cholesterol: 112
Triglycerides: 93 (ref 40–160)

## 2015-09-20 LAB — HEPATIC FUNCTION PANEL
ALK PHOS: 56 (ref 25–125)
ALT: 16 (ref 7–35)
AST: 27 (ref 13–35)
Bilirubin, Total: 0.3

## 2015-09-20 LAB — CBC AND DIFFERENTIAL
HCT: 36 (ref 36–46)
HEMOGLOBIN: 11.5 — AB (ref 12.0–16.0)
PLATELETS: 244 (ref 150–399)
WBC: 6.3

## 2015-09-20 LAB — BASIC METABOLIC PANEL
BUN: 19 (ref 4–21)
Creatinine: 0.9 (ref 0.5–1.1)
Glucose: 69
POTASSIUM: 5.1 (ref 3.4–5.3)
Sodium: 138 (ref 137–147)

## 2015-09-20 LAB — TSH: TSH: 5.63 (ref 0.41–5.90)

## 2015-09-28 LAB — IFOBT (OCCULT BLOOD): IFOBT: NEGATIVE

## 2015-11-15 ENCOUNTER — Other Ambulatory Visit (HOSPITAL_COMMUNITY): Payer: PRIVATE HEALTH INSURANCE

## 2015-11-17 ENCOUNTER — Other Ambulatory Visit: Payer: Self-pay | Admitting: Internal Medicine

## 2015-12-08 LAB — HEPATIC FUNCTION PANEL
ALT: 14 (ref 7–35)
AST: 27 (ref 13–35)
Alkaline Phosphatase: 64 (ref 25–125)
BILIRUBIN, TOTAL: 0.2

## 2015-12-08 LAB — BASIC METABOLIC PANEL
BUN: 14 (ref 4–21)
Creatinine: 0.8 (ref 0.5–1.1)
GLUCOSE: 107
Potassium: 4.2 (ref 3.4–5.3)
Sodium: 136 — AB (ref 137–147)

## 2015-12-08 LAB — CBC AND DIFFERENTIAL
HEMATOCRIT: 34 — AB (ref 36–46)
Hemoglobin: 11 — AB (ref 12.0–16.0)
PLATELETS: 240 (ref 150–399)
WBC: 6.9

## 2015-12-18 ENCOUNTER — Encounter: Payer: Self-pay | Admitting: Cardiology

## 2015-12-18 ENCOUNTER — Telehealth: Payer: Self-pay | Admitting: Cardiology

## 2015-12-18 ENCOUNTER — Ambulatory Visit (INDEPENDENT_AMBULATORY_CARE_PROVIDER_SITE_OTHER): Payer: No Typology Code available for payment source | Admitting: Cardiology

## 2015-12-18 VITALS — BP 100/70 | HR 64 | Ht 67.0 in | Wt 134.8 lb

## 2015-12-18 DIAGNOSIS — R42 Dizziness and giddiness: Secondary | ICD-10-CM

## 2015-12-18 DIAGNOSIS — I341 Nonrheumatic mitral (valve) prolapse: Secondary | ICD-10-CM | POA: Diagnosis not present

## 2015-12-18 DIAGNOSIS — I340 Nonrheumatic mitral (valve) insufficiency: Secondary | ICD-10-CM

## 2015-12-18 LAB — TSH: TSH: 4.53 (ref 0.41–5.90)

## 2015-12-18 NOTE — Patient Instructions (Signed)
[place]  recommends that you increase the salt in your diet to help to bring up your blood pressure.  Keep your echo appointment and also your follow up with Dr Meda Coffee.

## 2015-12-18 NOTE — Progress Notes (Signed)
12/18/2015 [name]   Apr 13, 1951  [id number]  Primary Physician Velna Hatchet, MD Primary Cardiologist: Dr. Meda Coffee   Reason for Visit/CC: low blood pressure, dizziness  HPI:  Patient is 65 year old female, followed by Dr. Meda Coffee. She has a history of mitral valve prolapse and mitral regurgitation. She recently moved to [place] from [place] and recently established care with Dr. Meda Coffee in June 2016. Dr. Meda Coffee recommended that we perform a TTE to have a baseline study and to evaluate for left ventricle size and to assess the severity of her mitral regurgitation. This was performed 11/04/2014. This demonstrated normal left ventricular systolic function with an estimated ejection fraction of 55-60%. Visualization of the mitral valve revealed severe prolapse and thickening. MR was felt to be mild to moderate. Dr. Meda Coffee felt that there was no need for surgery at that time dhe recommended repeat follow-up echocardiogram in one year.   Since her last visit, she was diagnosed with lung cancer which was treated with chemotherapy and lobectomy at [facility]. As a result, she has had issues with anemia, which has remained stable ~9-10. She also has a history of hypothyroidism followed by her PCP. She is on levothyroxine.  Patient presents back to clinic today with a main complaint of dizziness and low blood pressure readings at home. According to the patient, this is been happening for several weeks now. She saw her PCP who recommended that she follow-up in our clinic for further recommendations. She is not currently on any antihypertensive medications. She recently had lab work done at her PCP office on July 17 which showed normal renal function. No signs of dehydration. CBC showed slight anemia with a hemoglobin of 10.9. In review of prior hospital records at [facility], her hemoglobin was as low as 8.9 while undergoing chemotherapy.   Current Outpatient  Prescriptions  Medication Sig Dispense Refill  . aspirin EC 81 MG tablet Take 81 mg by mouth daily.    . DULERA 100-5 MCG/ACT AERO USE 2 PUFFS FIRST THING IN THE MORNING AND 2 PUFFS 12 HOURS LATER 13 Inhaler 1  . Ginkgo Biloba 40 MG TABS Take 1 tablet by mouth daily.     Marland Kitchen ibuprofen (ADVIL,MOTRIN) 200 MG tablet Take 200 mg by mouth every 8 (eight) hours as needed for fever, headache or cramping.     Marland Kitchen levothyroxine (SYNTHROID, LEVOTHROID) 175 MCG tablet Take 175 mcg by mouth daily before breakfast.    . Multiple Vitamins-Minerals (MULTI VITAMIN/MINERALS PO) Take 1 tablet by mouth daily.     . Omega-3 Fatty Acids (FISH OIL PO) Take 1 capsule by mouth daily.     . pseudoephedrine (SUDAFED) 30 MG tablet Take 30 mg by mouth every 4 (four) hours as needed for congestion.    . pseudoephedrine-guaifenesin (MUCINEX D) 60-600 MG 12 hr tablet Take 1 tablet by mouth every 12 (twelve) hours.    . Zinc 30 MG TABS Take 1 tablet by mouth daily.     No current facility-administered medications for this visit.     Allergies  Allergen Reactions  . Penicillins Hives    Unknown, occurred as a child    Social History   Social History  . Marital status: Divorced    Spouse name: N/A  . Number of children: N/A  . Years of education: N/A   Occupational History  . Not on file.   Social History Main Topics  . Smoking status: Current Every Day Smoker    Packs/day: 0.00  Years: 24.00    Types: Cigarettes  . Smokeless tobacco: Never Used  . Alcohol use 0.6 oz/week    1 Shots of liquor per week     Comment: socially  . Drug use: No  . Sexual activity: Not on file   Other Topics Concern  . Not on file   Social History Narrative  . No narrative on file     Review of Systems: General: negative for chills, fever, night sweats or weight changes.  Cardiovascular: negative for chest pain, dyspnea on exertion, edema, orthopnea, palpitations, paroxysmal nocturnal dyspnea or shortness of  breath Dermatological: negative for rash Respiratory: negative for cough or wheezing Urologic: negative for hematuria Abdominal: negative for nausea, vomiting, diarrhea, bright red blood per rectum, melena, or hematemesis Neurologic: negative for visual changes, syncope, or dizziness All other systems reviewed and are otherwise negative except as noted above.    Blood pressure 100/70, pulse 64, height '5\' 7"'$  (1.702 m), weight 134 lb 12.8 oz (61.1 kg).  General appearance: alert, cooperative and no distress Neck: no carotid bruit and no JVD Lungs: clear to auscultation bilaterally Heart: regular rate and rhythm, S1, S2 normal, no murmur, click, rub or gallop Extremities: extremities normal, atraumatic, no cyanosis or edema Pulses: 2+ and symmetric Skin: Skin color, texture, turgor normal. No rashes or lesions Neurologic: Grossly normal  EKG Sinus bradycardia. 56 bpm  ASSESSMENT AND PLAN:   1. Symptomatic Borderline Hypotension: pressure is low in the 49Q systolic however orthostatic vital signs were checked and she does not appear to be orthostatic. She is not on any type of antihypertensives. She reports that she has remained well-hydrated. Recent laboratory work reviewed. Patient encouraged to increase sodium intake. Monitor blood pressure closely at home.  2. Mitral valve prolapse with mitral regurgitation: 2-D echocardiogram June 2017 revealed mitral valve prolapse with mild to moderate MR. EF was 55-60%. Dr. Meda Coffee recommended one year repeat echocardiogram. She is overdue for this. We will order another echocardiogram to reassess for disease progression.  3. Sinus Bradycardia: HF 56 bpm. Patient complaint of dizziness/ fatigue, however, suspect this is mainly related to low BP. She is not on any AVN blocking agents. If patient has no improvement in symptoms, can consider 24-48 hr heart monitor, at a later time to further assess.   PLAN  pressure is low in the 75F systolic however  orthostatic vital signs were checked and she does not appear to be orthostatic. She is not on any type of antihypertensives. She reports that she has remained well-hydrated. Recent laboratory work reviewed. Patient encouraged to increase sodium intake. Monitor blood pressure closely at home. Return for repeat echocardiogram. At that time we can recheck her blood pressure as well as an EKG to assess pulse rate.  Lyda Jester PA-C 12/18/2015 2:58 PM

## 2015-12-18 NOTE — Telephone Encounter (Signed)
New message    Pt would like an earlier appt than 09/27 due to low BP and lightheadedness. Please call.    Pt c/o BP issue: STAT if pt c/o blurred vision, one-sided weakness or slurred speech  1. What are your last 5 BP readings? 90/60  2. Are you having any other symptoms (ex. Dizziness, headache, blurred vision, passed out)? Lightheadedness when standing up  3. What is your BP issue? Low blood pressure  Pt prefer to see the doctor.

## 2015-12-18 NOTE — Telephone Encounter (Signed)
Patient complaining of low BP's and lightheadedness. Patient stated this has been happening daily for weeks now. Patient saw her PCP and he recommended patient to see her cardiologist sooner than later. Made patient an appointment today with Mare Loan PA to be evaluated. Patient is not on any BP medications, and is only taking ASA. Patient has history of  Mitral valve prolapse. Will forward to [employer] PA.

## 2016-01-25 DIAGNOSIS — Z85118 Personal history of other malignant neoplasm of bronchus and lung: Secondary | ICD-10-CM | POA: Diagnosis not present

## 2016-01-25 DIAGNOSIS — C3482 Malignant neoplasm of overlapping sites of left bronchus and lung: Secondary | ICD-10-CM | POA: Diagnosis not present

## 2016-01-25 DIAGNOSIS — J479 Bronchiectasis, uncomplicated: Secondary | ICD-10-CM | POA: Diagnosis not present

## 2016-01-25 DIAGNOSIS — R918 Other nonspecific abnormal finding of lung field: Secondary | ICD-10-CM | POA: Diagnosis not present

## 2016-01-26 NOTE — Telephone Encounter (Signed)
Patient kept appointment with Mare Loan PA and was seen on 12/18/15.

## 2016-01-31 ENCOUNTER — Encounter: Payer: Self-pay | Admitting: Cardiology

## 2016-02-05 ENCOUNTER — Other Ambulatory Visit (HOSPITAL_COMMUNITY): Payer: No Typology Code available for payment source

## 2016-02-12 ENCOUNTER — Other Ambulatory Visit: Payer: Self-pay | Admitting: Internal Medicine

## 2016-02-14 ENCOUNTER — Other Ambulatory Visit: Payer: Self-pay

## 2016-02-14 ENCOUNTER — Ambulatory Visit (INDEPENDENT_AMBULATORY_CARE_PROVIDER_SITE_OTHER): Payer: Medicare Other | Admitting: Cardiology

## 2016-02-14 ENCOUNTER — Ambulatory Visit (HOSPITAL_COMMUNITY): Payer: Medicare Other | Attending: Cardiovascular Disease

## 2016-02-14 ENCOUNTER — Encounter: Payer: Self-pay | Admitting: *Deleted

## 2016-02-14 VITALS — BP 112/58 | HR 60 | Ht 67.0 in | Wt 135.0 lb

## 2016-02-14 DIAGNOSIS — I371 Nonrheumatic pulmonary valve insufficiency: Secondary | ICD-10-CM | POA: Diagnosis not present

## 2016-02-14 DIAGNOSIS — I340 Nonrheumatic mitral (valve) insufficiency: Secondary | ICD-10-CM

## 2016-02-14 DIAGNOSIS — I071 Rheumatic tricuspid insufficiency: Secondary | ICD-10-CM | POA: Insufficient documentation

## 2016-02-14 DIAGNOSIS — I517 Cardiomegaly: Secondary | ICD-10-CM | POA: Insufficient documentation

## 2016-02-14 DIAGNOSIS — R42 Dizziness and giddiness: Secondary | ICD-10-CM | POA: Diagnosis not present

## 2016-02-14 DIAGNOSIS — Z720 Tobacco use: Secondary | ICD-10-CM | POA: Diagnosis not present

## 2016-02-14 DIAGNOSIS — M349 Systemic sclerosis, unspecified: Secondary | ICD-10-CM | POA: Diagnosis not present

## 2016-02-14 DIAGNOSIS — I341 Nonrheumatic mitral (valve) prolapse: Secondary | ICD-10-CM | POA: Diagnosis not present

## 2016-02-14 DIAGNOSIS — F17200 Nicotine dependence, unspecified, uncomplicated: Secondary | ICD-10-CM

## 2016-02-14 NOTE — Patient Instructions (Addendum)
Medication Instructions:   Your physician recommends that you continue on your current medications as directed. Please refer to the Current Medication list given to you today.'   Testing/Procedures:  Your physician has requested that you have a TEE. During a TEE, sound waves are used to create images of your heart. It provides your doctor with information about the size and shape of your heart and how well your heart's chambers and valves are working. In this test, a transducer is attached to the end of a flexible tube that's guided down your throat and into your esophagus (the tube leading from you mouth to your stomach) to get a more detailed image of your heart. You are not awake for the procedure. Please see the instruction sheet given to you today. For further information please visit [email or web address].  YOUR TEE IS SCHEDULED FOR Thursday February 22, 2016 AT 1 PM WITH DR NELSON TO DO.  YOU MUST ARRIVE AT [place] AT 11:30 AM THE DAY OF THIS PROCEDURE.      Follow-Up:  3 MONTHS WITH DR Meda Coffee       If you need a refill on your cardiac medications before your next appointment, please call your pharmacy.

## 2016-02-14 NOTE — Progress Notes (Addendum)
Patient ID: Joy Williams, female   DOB: Mar 05, 1951, 65 y.o.   MRN: [id number]      Cardiology Office Note  Date:  02/14/2016   ID:  Joy Williams, DOB Mar 30, 1951, MRN [id number]  PCP:  Velna Hatchet, MD  Cardiologist:  Ena Dawley, MD   Chief complain: mitral valve prolapse, severe mitral regurgitation   History of Present Illness: Joy Williams is a 65 y.o. female who presents to establish cardiology care. The patient moved here from [place]. She has h/o lifelong MVP and was told that she has severe MR but surgery is not indicated yet. He mother, brother and sister all had MVP and underwent MV surgery.  The patient is not exercising, but is able to perform all the ADL and has full time job that she is performing without any difficulties.  She denies palpitations, syncope, orthopnea, PND, LE edema, DOE, or chest pain.  She hasn't had her cholesterol checked in a while.  02/14/2016 - two months follow up, the patient has been experiencing worsening DOE, she used to be able to run, now has to stop, this is a significant change for her. There is also dizziness at rest and on exertion. No syncope, no palpitations. No LE edema or orthopnea.    Past Medical History:  Diagnosis Date  . Allergy   . Asthma   . Complication of anesthesia   . COPD (chronic obstructive pulmonary disease) ([place])   . CREST (calcinosis, Raynaud's phenomenon, esophageal dysfunction, sclerodactyly, telangiectasia) ([place])    questionable  . Family history of adverse reaction to anesthesia    Mother- very sensitive to medication  . Heart murmur    MVP  . Hypothyroidism   . MVP (mitral valve prolapse)   . PONV (postoperative nausea and vomiting)   . Raynaud's disease   . Shortness of breath dyspnea    with exertion  . Thyroid disease     Past Surgical History:  Procedure Laterality Date  . BACK SURGERY     x 3  Disectomy  . CLAVICLE SURGERY Left 2013   plate to left collar bone  . LAPAROSCOPY     ?  reason-age 51   . TONSILLECTOMY    . wrist surgery Left 2015   plate to wrist      Current Outpatient Prescriptions  Medication Sig Dispense Refill  . aspirin EC 81 MG tablet Take 81 mg by mouth daily.    . DULERA 100-5 MCG/ACT AERO USE 2 PUFFS FIRST THING IN THE MORNING AND 2 PUFFS 12 HOURS LATER 3 Inhaler 0  . ibuprofen (ADVIL,MOTRIN) 200 MG tablet Take 200 mg by mouth every 8 (eight) hours as needed for fever, headache or cramping.     Marland Kitchen levothyroxine (SYNTHROID, LEVOTHROID) 175 MCG tablet Take 175 mcg by mouth daily before breakfast.    . Multiple Vitamins-Minerals (MULTI VITAMIN/MINERALS PO) Take 1 tablet by mouth daily.     . Omega-3 Fatty Acids (FISH OIL PO) Take 1 capsule by mouth daily.     . pseudoephedrine (SUDAFED) 30 MG tablet Take 30 mg by mouth every 4 (four) hours as needed for congestion.    . pseudoephedrine-guaifenesin (MUCINEX D) 60-600 MG 12 hr tablet Take 1 tablet by mouth every 12 (twelve) hours.    . Zinc 30 MG TABS Take 1 tablet by mouth daily.     No current facility-administered medications for this visit.    Allergies:   Penicillins   Social History:  The patient  reports that  she has been smoking Cigarettes.  She has been smoking about 0.00 packs per day for the past 24.00 years. She has never used smokeless tobacco. She reports that she drinks about 0.6 oz of alcohol per week . She reports that she does not use drugs.   Family History:  The patient's family history includes Dementia in her father; Mitral valve prolapse in her brother, mother, and sister; Prostate cancer in her father.    ROS:  Please see the history of present illness.     All other systems are reviewed and negative.    PHYSICAL EXAM: VS:  BP (!) 112/58   Pulse 60   Ht '5\' 7"'$  (1.702 m)   Wt 135 lb (61.2 kg)   BMI 21.14 kg/m  , BMI Body mass index is 21.14 kg/m. GEN: Well nourished, well developed, in no acute distress  HEENT: normal  Neck: no JVD, carotid bruits, or  masses Cardiac: RRR; early systolic click followed by a loud murmur, rubs, or gallops,no edema  Respiratory:  clear to auscultation bilaterally, normal work of breathing GI: soft, nontender, nondistended, + BS MS: no deformity or atrophy  Skin: warm and dry, no rash Neuro:  Strength and sensation are intact Psych: euthymic mood, full affect  EKG:  EKG: SR, normal ECG  Recent Labs: No results found for requested labs within last 8760 hours.   Lipid Panel    Component Value Date/Time   CHOL 177 10/28/2014 1025   TRIG 115 10/28/2014 1025   HDL 70 10/28/2014 1025   LDLCALC 84 10/28/2014 1025      Wt Readings from Last 3 Encounters:  02/14/16 135 lb (61.2 kg)  12/18/15 134 lb 12.8 oz (61.1 kg)  06/07/15 134 lb (60.8 kg)    TTE: 02/14/16  - Left ventricle: The cavity size was normal. There was moderate concentric hypertrophy. Systolic function was vigorous. The estimated ejection fraction was in the range of 65% to 70%. Wall motion was normal; there were no regional wall motion abnormalities. Features are consistent with a pseudonormal left ventricular filling pattern, with concomitant abnormal relaxation and increased filling pressure (grade 2 diastolic dysfunction). Doppler parameters are consistent with elevated ventricular end-diastolic filling pressure. - Aortic valve: Trileaflet; normal thickness leaflets. There was no regurgitation. - Aortic root: The aortic root was normal in size. 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 The findings are consistent with mild stenosis. There was severe regurgitation. - Left atrium: The atrium was severely dilated. - Right ventricle: Systolic function was normal. - Tricuspid valve: There was mild regurgitation. - Pulmonic valve: There was trivial regurgitation. - Pulmonary arteries: Systolic pressure was mildly increased. PA peak pressure: 37 mm Hg  (S).  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 Mitral regurgitation is most probably severe, further evaluation with a TEE is recommended.  Other studies Reviewed: Records requested   ASSESSMENT AND PLAN:  65 year old female  1. MVP, severe MR on today's TTE that I have personally reviewed, the patient has worsening exercise capacity and pulmonary hypertension.  We will schedule a TEE and refer to CT surgery.   2. Hypotension, dizziness - encouraged to drink electrolytes drinks, no syncope or falls.   3. Lipids - at goal in 10/2014  Follow up after TEE.  Signed, Ena Dawley, MD  02/14/2016 9:59 AM    [place],  [place]  [address] Phone: [phone]; Fax: [phone]

## 2016-02-15 ENCOUNTER — Telehealth: Payer: Self-pay | Admitting: Cardiology

## 2016-02-15 NOTE — Telephone Encounter (Signed)
I will refer her to CT surgery once I have TEE results next week.

## 2016-02-15 NOTE — Telephone Encounter (Signed)
Follow Up:    She saw Dr Meda Coffee yesterday. She said Dr Meda Coffee said something about her seeing a surgeon. Will this be after her Darden Dates? Will Dr Meda Coffee schedule this? She just wanted to know how this was going to be and who would handle this?

## 2016-02-15 NOTE — Telephone Encounter (Signed)
Informed the pt that per Dr Meda Coffee, she will refer her to CT surgery once she performs her TEE next week, and has her results.  Pt verbalized understanding, agrees with this plan, and gracious for all the assistance provided.

## 2016-02-15 NOTE — Telephone Encounter (Signed)
Dr Meda Coffee, what would you like for me to tell her about TCTS?  I will call her back.

## 2016-02-22 ENCOUNTER — Encounter (HOSPITAL_COMMUNITY)
Admission: RE | Disposition: A | Discharge status: Home / Self Care | Payer: Self-pay | Source: Ambulatory Visit | Attending: Cardiology

## 2016-02-22 ENCOUNTER — Ambulatory Visit (HOSPITAL_COMMUNITY)
Admission: RE | Admit: 2016-02-22 | Discharge: 2016-02-22 | Disposition: A | Discharge status: Home / Self Care | Payer: Medicare Other | Source: Ambulatory Visit | Attending: Cardiology | Admitting: Cardiology

## 2016-02-22 ENCOUNTER — Encounter (HOSPITAL_COMMUNITY): Payer: Self-pay | Admitting: *Deleted

## 2016-02-22 ENCOUNTER — Ambulatory Visit (HOSPITAL_BASED_OUTPATIENT_CLINIC_OR_DEPARTMENT_OTHER): Payer: Medicare Other

## 2016-02-22 DIAGNOSIS — E039 Hypothyroidism, unspecified: Secondary | ICD-10-CM | POA: Insufficient documentation

## 2016-02-22 DIAGNOSIS — Z79899 Other long term (current) drug therapy: Secondary | ICD-10-CM | POA: Insufficient documentation

## 2016-02-22 DIAGNOSIS — M341 CR(E)ST syndrome: Secondary | ICD-10-CM | POA: Insufficient documentation

## 2016-02-22 DIAGNOSIS — J449 Chronic obstructive pulmonary disease, unspecified: Secondary | ICD-10-CM | POA: Diagnosis not present

## 2016-02-22 DIAGNOSIS — I959 Hypotension, unspecified: Secondary | ICD-10-CM | POA: Diagnosis not present

## 2016-02-22 DIAGNOSIS — R42 Dizziness and giddiness: Secondary | ICD-10-CM | POA: Insufficient documentation

## 2016-02-22 DIAGNOSIS — Z7982 Long term (current) use of aspirin: Secondary | ICD-10-CM | POA: Insufficient documentation

## 2016-02-22 DIAGNOSIS — I340 Nonrheumatic mitral (valve) insufficiency: Secondary | ICD-10-CM | POA: Diagnosis not present

## 2016-02-22 DIAGNOSIS — I081 Rheumatic disorders of both mitral and tricuspid valves: Secondary | ICD-10-CM | POA: Diagnosis not present

## 2016-02-22 DIAGNOSIS — I272 Pulmonary hypertension, unspecified: Secondary | ICD-10-CM | POA: Diagnosis not present

## 2016-02-22 DIAGNOSIS — I341 Nonrheumatic mitral (valve) prolapse: Secondary | ICD-10-CM

## 2016-02-22 DIAGNOSIS — F17210 Nicotine dependence, cigarettes, uncomplicated: Secondary | ICD-10-CM | POA: Diagnosis not present

## 2016-02-22 HISTORY — PX: TEE WITHOUT CARDIOVERSION: SHX5443

## 2016-02-22 LAB — ECHO TEE
PISA EROA: 0.44 cm2
Reg peak vel: 327 cm/s
TR max vel: 327 cm/s
VTI: 211 cm

## 2016-02-22 SURGERY — ECHOCARDIOGRAM, TRANSESOPHAGEAL
Anesthesia: Moderate Sedation

## 2016-02-22 MED ORDER — MIDAZOLAM HCL 5 MG/ML IJ SOLN
INTRAMUSCULAR | Status: AC
  Filled 2016-02-22: qty 2

## 2016-02-22 MED ORDER — MIDAZOLAM HCL 10 MG/2ML IJ SOLN
INTRAMUSCULAR | Status: DC | PRN
Start: 2016-02-22 — End: 2016-02-22
  Administered 2016-02-22 (×2): 2 mg via INTRAVENOUS

## 2016-02-22 MED ORDER — FENTANYL CITRATE (PF) 100 MCG/2ML IJ SOLN
INTRAMUSCULAR | Status: DC | PRN
  Administered 2016-02-22: 50 ug via INTRAVENOUS
  Administered 2016-02-22: 25 ug via INTRAVENOUS

## 2016-02-22 MED ORDER — FENTANYL CITRATE (PF) 100 MCG/2ML IJ SOLN
INTRAMUSCULAR | Status: AC
  Filled 2016-02-22: qty 2

## 2016-02-22 MED ORDER — SODIUM CHLORIDE 0.9 % IV SOLN
INTRAVENOUS | Status: DC
  Administered 2016-02-22: 12:00:00 via INTRAVENOUS

## 2016-02-22 MED ORDER — BUTAMBEN-TETRACAINE-BENZOCAINE 2-2-14 % EX AERO
INHALATION_SPRAY | CUTANEOUS | Status: DC | PRN
  Administered 2016-02-22: 2 via TOPICAL

## 2016-02-22 NOTE — Interval H&P Note (Signed)
History and Physical Interval Note:  02/22/2016 1:03 PM  Joy Williams  has presented today for surgery, with the diagnosis of SEVERE MITRAL VALVE PROLAPSE  The various methods of treatment have been discussed with the patient and family. After consideration of risks, benefits and other options for treatment, the patient has consented to  Procedure(s): TRANSESOPHAGEAL ECHOCARDIOGRAM (TEE) (N/A) as a surgical intervention .  The patient's history has been reviewed, patient examined, no change in status, stable for surgery.  I have reviewed the patient's chart and labs.  Questions were answered to the patient's satisfaction.     Ena Dawley

## 2016-02-22 NOTE — Progress Notes (Signed)
  Echocardiogram Echocardiogram Transesophageal has been performed.  Joy Williams 02/22/2016, 1:40 PM

## 2016-02-22 NOTE — Discharge Instructions (Signed)
Moderate Conscious Sedation, Adult °Sedation is the use of medicines to promote relaxation and relieve discomfort and anxiety. Moderate conscious sedation is a type of sedation. Under moderate conscious sedation you are less alert than normal but are still able to respond to instructions or stimulation. Moderate conscious sedation is used during short medical and dental procedures. It is milder than deep sedation or general anesthesia and allows you to return to your regular activities sooner. °LET YOUR HEALTH CARE PROVIDER KNOW ABOUT:  °· Any allergies you have. °· All medicines you are taking, including vitamins, herbs, eye drops, creams, and over-the-counter medicines. °· Use of steroids (by mouth or creams). °· Previous problems you or members of your family have had with the use of anesthetics. °· Any blood disorders you have. °· Previous surgeries you have had. °· Medical conditions you have. °· Possibility of pregnancy, if this applies. °· Use of cigarettes, alcohol, or illegal drugs. °RISKS AND COMPLICATIONS °Generally, this is a safe procedure. However, as with any procedure, problems can occur. Possible problems include: °· Oversedation. °· Trouble breathing on your own. You may need to have a breathing tube until you are awake and breathing on your own. °· Allergic reaction to any of the medicines used for the procedure. °BEFORE THE PROCEDURE °· You may have blood tests done. These tests can help show how well your kidneys and liver are working. They can also show how well your blood clots. °· A physical exam will be done.   °· Only take medicines as directed by your health care provider. You may need to stop taking medicines (such as blood thinners, aspirin, or nonsteroidal anti-inflammatory drugs) before the procedure.   °· Do not eat or drink at least 6 hours before the procedure or as directed by your health care provider. °· Arrange for a responsible adult, family member, or friend to take you home  after the procedure. He or she should stay with you for at least 24 hours after the procedure, until the medicine has worn off. °PROCEDURE  °· An intravenous (IV) catheter will be inserted into one of your veins. Medicine will be able to flow directly into your body through this catheter. You may be given medicine through this tube to help prevent pain and help you relax. °· The medical or dental procedure will be done. °AFTER THE PROCEDURE °· You will stay in a recovery area until the medicine has worn off. Your blood pressure and pulse will be checked.   °·  Depending on the procedure you had, you may be allowed to go home when you can tolerate liquids and your pain is under control. °  °This information is not intended to replace advice given to you by your health care provider. Make sure you discuss any questions you have with your health care provider. °  °Document Released: 01/29/2001 Document Revised: 05/27/2014 Document Reviewed: 01/11/2013 °Elsevier Interactive Patient Education ©2016 [employer] °Transesophageal Echocardiogram °Transesophageal echocardiography (TEE) is a special type of test that produces images of the heart by using sound waves (echocardiogram). This type of echocardiography can obtain better images of the heart than standard echocardiography. TEE is done by passing a flexible tube down the esophagus. The heart is located in front of the esophagus. Because the heart and esophagus are close to one another, your health care provider can take very clear, detailed pictures of the heart via ultrasound waves. °TEE may be done: °· If your health care provider needs more information based on standard echocardiography findings. °·   If you had a stroke. This might have happened because a clot formed in your heart. TEE can visualize different areas of the heart and check for clots. °· To check valve anatomy and function. °· To check for infection on the inside of your heart (endocarditis). °· To  evaluate the dividing wall (septum) of the heart and presence of a hole that did not close after birth (patent foramen ovale or atrial septal defect). °· To help diagnose a tear in the wall of the aorta (aortic dissection). °· During cardiac valve surgery. This allows the surgeon to assess the valve repair before closing the chest. °· During a variety of other cardiac procedures to guide positioning of catheters. °· Sometimes before a cardioversion, which is a shock to convert heart rhythm back to normal. °LET YOUR HEALTH CARE PROVIDER KNOW ABOUT:  °· Any allergies you have. °· All medicines you are taking, including vitamins, herbs, eye drops, creams, and over-the-counter medicines. °· Previous problems you or members of your family have had with the use of anesthetics. °· Any blood disorders you have. °· Previous surgeries you have had. °· Medical conditions you have. °· Swallowing difficulties. °· An esophageal obstruction. °RISKS AND COMPLICATIONS  °Generally, TEE is a safe procedure. However, as with any procedure, complications can occur. Possible complications include an esophageal tear (rupture). °BEFORE THE PROCEDURE  °· Do not eat or drink for 6 hours before the procedure or as directed by your health care provider. °· Arrange for someone to drive you home after the procedure. Do not drive yourself home. During the procedure, you will be given medicines that can continue to make you feel drowsy and can impair your reflexes. °· An IV access tube will be started in the arm. °PROCEDURE  °· A medicine to help you relax (sedative) will be given through the IV access tube. °· A medicine may be sprayed or gargled to numb the back of the throat. °· Your blood pressure, heart rate, and breathing (vital signs) will be monitored during the procedure. °· The TEE probe is a long, flexible tube. The tip of the probe is placed into the back of the mouth, and you will be asked to swallow. This helps to pass the tip of the  probe into the esophagus. Once the tip of the probe is in the correct area, your health care provider can take pictures of the heart. °· TEE is usually not a painful procedure. You may feel the probe press against the back of the throat. The probe does not enter the trachea and does not affect your breathing. °AFTER THE PROCEDURE  °· You will be in bed, resting, until you have fully returned to consciousness. °· When you first awaken, your throat may feel slightly sore and will probably still feel numb. This will improve slowly over time. °· You will not be allowed to eat or drink until it is clear that the numbness has improved. °· Once you have been able to drink, urinate, and sit on the edge of the bed without feeling sick to your stomach (nausea) or dizzy, you may be cleared to go home. °· You should have a friend or family member with you for the next 24 hours after your procedure. °  °This information is not intended to replace advice given to you by your health care provider. Make sure you discuss any questions you have with your health care provider. °  °Document Released: 07/27/2002 Document Revised: 05/11/2013 Document Reviewed:   11/05/2012 °Elsevier Interactive Patient Education ©2016 [employer] ° °

## 2016-02-22 NOTE — CV Procedure (Signed)
     Transesophageal Echocardiogram Note  Joy Williams [id number] 12-Apr-1951  Procedure: Transesophageal Echocardiogram Indications: Mitral valve prolapse, mitral regurgitation  Procedure Details Consent: Obtained Time Out: Verified patient identification, verified procedure, site/side was marked, verified correct patient position, special equipment/implants available, Radiology Safety Procedures followed,  medications/allergies/relevent history reviewed, required imaging and test results available.  Performed  Medications: Fentanyl: 75 g Versed: 6 mg  - Left ventricle: The cavity size was normal. There was moderate   concentric hypertrophy. Systolic function was vigorous. The   estimated ejection fraction was in the range of 65% to 70%. Wall   motion was normal; there were no regional wall motion   abnormalities. - Aortic valve: Trileaflet; normal thickness leaflets. There was no   regurgitation. - Aortic root: The aortic root was normal in size. 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 PISA is 1.2 and regurgitant volume 93 mL. - Left atrium: The atrium was severely dilated. - Right ventricle: Systolic function was normal. 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 - Pulmonic valve: There was mild regurgitation. - Pulmonary arteries: Systolic pressure was mildly increased. PA   peak pressure: 46 mmHg. Left Atrium/ Left atrial appendage: There is no traumas in the left atrial appendage and are normal filling and emptying velocities. Atrial septum: No PFO present by bubble study. Aorta: Minimal  nonmobile atherosclerotic plaque.  Impressions: - Myxomatous changes associated with severe prolapse and severe mitral regurgitation. 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 There is moderate pulmonary hypertension. 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  Complications: No apparent complications Patient did tolerate procedure well.  Ena Dawley, MD, [facility] 02/22/2016, 1:03 PM

## 2016-02-22 NOTE — H&P (View-Only) (Signed)
Patient ID: Joy Williams, female   DOB: 02/18/1951, 65 y.o.   MRN: [id number]      Cardiology Office Note  Date:  02/14/2016   ID:  Joy Williams, DOB [birth year], MRN [id number]  PCP:  Velna Hatchet, MD  Cardiologist:  Ena Dawley, MD   Chief complain: mitral valve prolapse, severe mitral regurgitation   History of Present Illness: Joy Williams is a 65 y.o. female who presents to establish cardiology care. The patient moved here from [place]. She has h/o lifelong MVP and was told that she has severe MR but surgery is not indicated yet. He mother, brother and sister all had MVP and underwent MV surgery.  The patient is not exercising, but is able to perform all the ADL and has full time job that she is performing without any difficulties.  She denies palpitations, syncope, orthopnea, PND, LE edema, DOE, or chest pain.  She hasn't had her cholesterol checked in a while.  02/14/2016 - two months follow up, the patient has been experiencing worsening DOE, she used to be able to run, now has to stop, this is a significant change for her. There is also dizziness at rest and on exertion. No syncope, no palpitations. No LE edema or orthopnea.    Past Medical History:  Diagnosis Date  . Allergy   . Asthma   . Complication of anesthesia   . COPD (chronic obstructive pulmonary disease) ([place])   . CREST (calcinosis, Raynaud's phenomenon, esophageal dysfunction, sclerodactyly, telangiectasia) ([place])    questionable  . Family history of adverse reaction to anesthesia    Mother- very sensitive to medication  . Heart murmur    MVP  . Hypothyroidism   . MVP (mitral valve prolapse)   . PONV (postoperative nausea and vomiting)   . Raynaud's disease   . Shortness of breath dyspnea    with exertion  . Thyroid disease     Past Surgical History:  Procedure Laterality Date  . BACK SURGERY     x 3  Disectomy  . CLAVICLE SURGERY Left 2013   plate to left collar bone  . LAPAROSCOPY     ?  reason-age 79   . TONSILLECTOMY    . wrist surgery Left 2015   plate to wrist      Current Outpatient Prescriptions  Medication Sig Dispense Refill  . aspirin EC 81 MG tablet Take 81 mg by mouth daily.    . DULERA 100-5 MCG/ACT AERO USE 2 PUFFS FIRST THING IN THE MORNING AND 2 PUFFS 12 HOURS LATER 3 Inhaler 0  . ibuprofen (ADVIL,MOTRIN) 200 MG tablet Take 200 mg by mouth every 8 (eight) hours as needed for fever, headache or cramping.     Marland Kitchen levothyroxine (SYNTHROID, LEVOTHROID) 175 MCG tablet Take 175 mcg by mouth daily before breakfast.    . Multiple Vitamins-Minerals (MULTI VITAMIN/MINERALS PO) Take 1 tablet by mouth daily.     . Omega-3 Fatty Acids (FISH OIL PO) Take 1 capsule by mouth daily.     . pseudoephedrine (SUDAFED) 30 MG tablet Take 30 mg by mouth every 4 (four) hours as needed for congestion.    . pseudoephedrine-guaifenesin (MUCINEX D) 60-600 MG 12 hr tablet Take 1 tablet by mouth every 12 (twelve) hours.    . Zinc 30 MG TABS Take 1 tablet by mouth daily.     No current facility-administered medications for this visit.    Allergies:   Penicillins   Social History:  The patient  reports that  she has been smoking Cigarettes.  She has been smoking about 0.00 packs per day for the past 24.00 years. She has never used smokeless tobacco. She reports that she drinks about 0.6 oz of alcohol per week . She reports that she does not use drugs.   Family History:  The patient's family history includes Dementia in her father; Mitral valve prolapse in her brother, mother, and sister; Prostate cancer in her father.    ROS:  Please see the history of present illness.     All other systems are reviewed and negative.    PHYSICAL EXAM: VS:  BP (!) 112/58   Pulse 60   Ht '5\' 7"'$  (1.702 m)   Wt 135 lb (61.2 kg)   BMI 21.14 kg/m  , BMI Body mass index is 21.14 kg/m. GEN: Well nourished, well developed, in no acute distress  HEENT: normal  Neck: no JVD, carotid bruits, or  masses Cardiac: RRR; early systolic click followed by a loud murmur, rubs, or gallops,no edema  Respiratory:  clear to auscultation bilaterally, normal work of breathing GI: soft, nontender, nondistended, + BS MS: no deformity or atrophy  Skin: warm and dry, no rash Neuro:  Strength and sensation are intact Psych: euthymic mood, full affect  EKG:  EKG: SR, normal ECG  Recent Labs: No results found for requested labs within last 8760 hours.   Lipid Panel    Component Value Date/Time   CHOL 177 10/28/2014 1025   TRIG 115 10/28/2014 1025   HDL 70 10/28/2014 1025   LDLCALC 84 10/28/2014 1025      Wt Readings from Last 3 Encounters:  02/14/16 135 lb (61.2 kg)  12/18/15 134 lb 12.8 oz (61.1 kg)  06/07/15 134 lb (60.8 kg)    TTE: 02/14/16  - Left ventricle: The cavity size was normal. There was moderate concentric hypertrophy. Systolic function was vigorous. The estimated ejection fraction was in the range of 65% to 70%. Wall motion was normal; there were no regional wall motion abnormalities. Features are consistent with a pseudonormal left ventricular filling pattern, with concomitant abnormal relaxation and increased filling pressure (grade 2 diastolic dysfunction). Doppler parameters are consistent with elevated ventricular end-diastolic filling pressure. - Aortic valve: Trileaflet; normal thickness leaflets. There was no regurgitation. - Aortic root: The aortic root was normal in size. 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 The findings are consistent with mild stenosis. There was severe regurgitation. - Left atrium: The atrium was severely dilated. - Right ventricle: Systolic function was normal. - Tricuspid valve: There was mild regurgitation. - Pulmonic valve: There was trivial regurgitation. - Pulmonary arteries: Systolic pressure was mildly increased. PA peak pressure: 37 mm Hg  (S).  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 Mitral regurgitation is most probably severe, further evaluation with a TEE is recommended.  Other studies Reviewed: Records requested   ASSESSMENT AND PLAN:  65 year old female  1. MVP, severe MR on today's TTE that I have personally reviewed, the patient has worsening exercise capacity and pulmonary hypertension.  We will schedule a TEE and refer to CT surgery.   2. Hypotension, dizziness - encouraged to drink electrolytes drinks, no syncope or falls.   3. Lipids - at goal in 10/2014  Follow up after TEE.  Signed, Ena Dawley, MD  02/14/2016 9:59 AM    [place],  [place]  [address] Phone: [phone]; Fax: [phone]

## 2016-02-28 ENCOUNTER — Encounter: Payer: PRIVATE HEALTH INSURANCE | Admitting: Thoracic Surgery (Cardiothoracic Vascular Surgery)

## 2016-02-29 ENCOUNTER — Encounter: Payer: Self-pay | Admitting: Thoracic Surgery (Cardiothoracic Vascular Surgery)

## 2016-02-29 ENCOUNTER — Encounter: Payer: PRIVATE HEALTH INSURANCE | Admitting: Thoracic Surgery (Cardiothoracic Vascular Surgery)

## 2016-02-29 ENCOUNTER — Institutional Professional Consult (permissible substitution) (INDEPENDENT_AMBULATORY_CARE_PROVIDER_SITE_OTHER): Payer: Medicare Other | Admitting: Thoracic Surgery (Cardiothoracic Vascular Surgery)

## 2016-02-29 VITALS — BP 102/51 | HR 77 | Resp 16 | Ht 67.0 in | Wt 135.0 lb

## 2016-02-29 DIAGNOSIS — I340 Nonrheumatic mitral (valve) insufficiency: Secondary | ICD-10-CM

## 2016-02-29 DIAGNOSIS — I369 Nonrheumatic tricuspid valve disorder, unspecified: Secondary | ICD-10-CM

## 2016-02-29 DIAGNOSIS — I341 Nonrheumatic mitral (valve) prolapse: Secondary | ICD-10-CM

## 2016-02-29 DIAGNOSIS — C3492 Malignant neoplasm of unspecified part of left bronchus or lung: Secondary | ICD-10-CM | POA: Diagnosis not present

## 2016-02-29 DIAGNOSIS — Q228 Other congenital malformations of tricuspid valve: Secondary | ICD-10-CM

## 2016-02-29 NOTE — Progress Notes (Addendum)
[address]       [place] [address]             [phone]     CARDIOTHORACIC SURGERY CONSULTATION REPORT  Referring Provider is Dorothy Spark, MD PCP is Velna Hatchet, MD  Chief Complaint  Patient presents with  . 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 The patient states that she has been known to have mitral valve prolapse for most of her adult life. She has several other close family members with mitral valve prolapse and her mother underwent mitral valve replacement 2 in the past. For many years she was followed by a cardiologist in [place]. She retired from the [employer] in November 2015 and moved to [place] to be near her family. The patient describes a long history of intermittent dizzy spells and ultimately sought to establish care with a cardiologist locally in [place]. She was initially evaluated by Dr. Meda Coffee in June 2016. Transthoracic echocardiogram performed at that time revealed normal left ventricular systolic function with severe bileaflet prolapse and what was felt to be mild to moderate mitral regurgitation.  At approximately the same time she was diagnosed with lung cancer and initially was evaluated locally in [place] but ultimately underwent surgical resection at [facility]. She was found to have synchronous primary tumors in the left lung, each of  which were treated with wedge resection. She underwent a total of 4 cycles of adjuvant chemotherapy postoperatively. Her most recent follow-up chest CT scan was performed in March 2017 at which time there was no sign of locally recurrent disease. She was seen in follow-up recently by Dr. Elenor Quinones at [facility] who plans to see the patient again in approximately 6 months with a repeat chest CT scan at that time.  More recently the patient returned to Dr. Francesca Oman office for follow-up with complaints of intermittent dizzy spells. The patient notes that her blood pressure has always run somewhat on the low side.  Over the last few months she has developed worsening exertional shortness of breath.  She was seen in follow-up recently by Dr. Meda Coffee and transthoracic echocardiogram revealed severe mitral regurgitation. Left ventricular systolic function remained normal with ejection fraction estimated 65-70%. There was mild pulmonary hypertension, severe left atrial enlargement, and mild tricuspid regurgitation.  Transesophageal echocardiogram was performed 02/22/2016. This confirmed the presence of bileaflet prolapse of the mitral valve with severe mitral regurgitation. ERO was measured 0.44 cm corresponding to an estimated regurgitant volume of 93 mL.  There was flow reversal in the pulmonary veins. Left ventricular systolic function appeared normal with ejection fraction estimated 60-65%. There was severe left atrial enlargement. There was prolapse of the posterior leaflet of the tricuspid valve with moderate tricuspid regurgitation. Pulmonary artery pressures were felt to be moderately increased.  The patient was referred for surgical consultation.  The patient is single and lives locally in [place], [place].  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 She retired in 2015. She remains entirely functionally independent. She does not exercise on a regular basis. She admits to a  long history of exertional shortness of breath which she states has progressed somewhat over the last few months. She does not get short of breath with ordinary activities but only if she does something more strenuous. She denies any history of resting shortness breath, PND, orthopnea, or lower extremity edema. She denies any palpitations. She has frequent dizzy spells with no history of syncope. She denies any history of exertional chest pain or chest tightness.  She states that she quit smoking at the time she had surgery for lung cancer, but she resumed smoking over the past year, currently smoking 4 or 5 cigarettes per day.  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 The patient does have Raynaud's disease but none of the other associated findings. 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 Approximately 5 months ago she underwent Botox injection of her vocal cord with successful restoration of her previous normal voice.   Past Medical History:  Diagnosis Date  . Allergy   . Asthma   . Complication of anesthesia   . COPD (chronic obstructive pulmonary disease) ([place])   . Family history of adverse reaction to anesthesia    Mother- very sensitive to medication  . Heart murmur    MVP  . Hypothyroidism   . Lung cancer ([place]) 12/30/2014   Synchronous primaries:  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, each treated by wedge resection with post-op adjuvant [facility]  . MVP (mitral valve prolapse)   . PONV (postoperative nausea and vomiting)   . Raynaud's disease   . Severe mitral regurgitation 11/15/2014  . Shortness of breath dyspnea    with exertion  . Thyroid disease     Past Surgical History:  Procedure Laterality Date  . BACK SURGERY     x 3  Disectomy  . CLAVICLE SURGERY Left 2013   plate to left collar bone  . LAPAROSCOPY     ? reason-age 31   . TEE WITHOUT  CARDIOVERSION N/A 02/22/2016   Procedure: TRANSESOPHAGEAL ECHOCARDIOGRAM (TEE);  Surgeon: Dorothy Spark, MD;  Location: [facility];  Service: Cardiovascular;  Laterality: N/A;  . TONSILLECTOMY    . VIDEO ASSISTED THORACOSCOPY (VATS)/WEDGE RESECTION Left 12/30/2014   Bronchoscopy, Mediastinoscopy, Left VATS for Wedge resection LUL x2 adn LLL x1 - Dr. Elenor Quinones at [facility]  . wrist surgery Left 2015   plate to wrist     Family History  Problem Relation Age of Onset  . Mitral valve prolapse Mother   . Dementia Father   . Prostate cancer Father   . Mitral valve prolapse Brother   . Mitral valve prolapse Sister   . Colon cancer Neg Hx   . Colon polyps Neg Hx   . Rectal cancer Neg Hx   . Esophageal cancer Neg Hx   . Stomach cancer Neg Hx   . Heart attack Neg Hx     Social History   Social History  . Marital status: Divorced    Spouse name: N/A  . Number of children: N/A  . Years of education: N/A   Occupational History  . Not on file.   Social History Main Topics  . Smoking status: Current Every Day Smoker  Packs/day: 0.00    Years: 24.00    Types: Cigarettes  . Smokeless tobacco: Never Used     Comment: SMOKES ABOUT 4 CIGS A DAY  . Alcohol use 0.6 oz/week    1 Shots of liquor per week     Comment: socially  . Drug use: No  . Sexual activity: Not on file   Other Topics Concern  . Not on file   Social History Narrative  . No narrative on file    Current Outpatient Prescriptions  Medication Sig Dispense Refill  . aspirin EC 81 MG tablet Take 81 mg by mouth daily.    . DULERA 100-5 MCG/ACT AERO USE 2 PUFFS FIRST THING IN THE MORNING AND 2 PUFFS 12 HOURS LATER 3 Inhaler 0  . ibuprofen (ADVIL,MOTRIN) 200 MG tablet Take 200 mg by mouth every 8 (eight) hours as needed for fever, headache or cramping.     Marland Kitchen levothyroxine (SYNTHROID, LEVOTHROID) 175 MCG tablet Take 175 mcg by mouth daily before breakfast.    . Multiple Vitamins-Minerals (MULTI VITAMIN/MINERALS PO) Take  1 tablet by mouth daily.     . Omega-3 Fatty Acids (FISH OIL PO) Take 1 capsule by mouth daily.     . pseudoephedrine (SUDAFED) 30 MG tablet Take 30 mg by mouth every 4 (four) hours as needed for congestion.    . pseudoephedrine-guaifenesin (MUCINEX D) 60-600 MG 12 hr tablet Take 1 tablet by mouth every 12 (twelve) hours.    . Zinc 30 MG TABS Take 1 tablet by mouth daily.     No current facility-administered medications for this visit.     Allergies  Allergen Reactions  . Penicillins Hives    Unknown, occurred as a child      Review of Systems:   General:  normal appetite, decreased energy, no weight gain, no weight loss, no fever  Cardiac:  no chest pain with exertion, no chest pain at rest, + SOB with exertion, no resting SOB, no PND, no orthopnea, no palpitations, no arrhythmia, no atrial fibrillation, no LE edema, + dizzy spells, no syncope  Respiratory:  + exertional shortness of breath, no home oxygen, no productive cough, + chronic dry cough that is typically worse in the mornings, intermittent bronchitis, no wheezing, no hemoptysis, no asthma, no pain with inspiration or cough, no sleep apnea, no CPAP at night  GI:   no difficulty swallowing, no reflux, no frequent heartburn, no hiatal hernia, no abdominal pain, no constipation, no diarrhea, no hematochezia, no hematemesis, no melena  GU:   no dysuria,  no frequency, no urinary tract infection, no hematuria, no kidney stones, no kidney disease  Vascular:  no pain suggestive of claudication, no pain in feet, + occasional leg cramps, no varicose veins, no DVT, no non-healing foot ulcer  Neuro:   no stroke, no TIA's, no seizures, no headaches, no temporary blindness one eye,  no slurred speech, no peripheral neuropathy, no chronic pain, no instability of gait, no memory/cognitive dysfunction  Musculoskeletal: no arthritis, no joint swelling, no myalgias, no difficulty walking, normal mobility   Skin:   no rash, no itching, no skin  infections, no pressure sores or ulcerations  Psych:   no anxiety, no depression, no nervousness, no unusual recent stress  Eyes:   no blurry vision, no floaters, no recent vision changes, + wears glasses or contacts  ENT:   + hearing loss, no loose or painful teeth, no dentures, last saw dentist within the past year  Hematologic:  +  easy bruising, no abnormal bleeding, no clotting disorder, no frequent epistaxis  Endocrine:  no diabetes, does not check CBG's at home     Physical Exam:   BP (!) 102/51   Pulse 77   Resp 16   Ht '5\' 7"'$  (1.702 m)   Wt 135 lb (61.2 kg)   SpO2 98% Comment: ON RA  BMI 21.14 kg/m   General:  Thin, well-appearing  HEENT:  Unremarkable   Neck:   no JVD, no bruits, no adenopathy   Chest:   clear to auscultation, symmetrical breath sounds, no wheezes, no rhonchi   CV:   RRR, grade III/VI systolic murmur   Abdomen:  soft, non-tender, no masses   Extremities:  warm, well-perfused, pulses diminished but palpable, no LE edema  Rectal/GU  Deferred  Neuro:   Grossly non-focal and symmetrical throughout  Skin:   Clean and dry, no rashes, no breakdown   Diagnostic Tests:  Transthoracic Echocardiography  Patient:    Joy Williams, Joy Williams MR #:       [id number] Study Date: 02/14/2016 Gender:     F Age:        65 Height:     170.2 cm Weight:     61.1 kg BSA:        1.7 m^2 Pt. Status: Room:   ATTENDING    Jenkins Rouge, M.D.  SONOGRAPHER  [place], RDCS  ORDERING     Ena Dawley, M.D.  REFERRING    Ena Dawley, M.D.  PERFORMING   [facility], Outpatient  cc:  ------------------------------------------------------------------- LV EF: 65% -   70%  ------------------------------------------------------------------- Indications:      Mitral regurgitation (I34).  ------------------------------------------------------------------- History:   Risk factors:  Scleroderma. Current tobacco  use.  ------------------------------------------------------------------- Study Conclusions  - Left ventricle: The cavity size was normal. There was moderate   concentric hypertrophy. Systolic function was vigorous. The   estimated ejection fraction was in the range of 65% to 70%. Wall   motion was normal; there were no regional wall motion   abnormalities. Features are consistent with a pseudonormal left   ventricular filling pattern, with concomitant abnormal relaxation   and increased filling pressure (grade 2 diastolic dysfunction).   Doppler parameters are consistent with elevated ventricular   end-diastolic filling pressure. - Aortic valve: Trileaflet; normal thickness leaflets. There was no   regurgitation. - Aortic root: The aortic root was normal in size. - Mitral valve: Myxomatous mitral valve with severe thicknening and   severe prolapse of both leaflets. The findings are consistent   with mild stenosis. There was severe regurgitation. - Left atrium: The atrium was severely dilated. - Right ventricle: Systolic function was normal. - Tricuspid valve: There was mild regurgitation. - Pulmonic valve: There was trivial regurgitation. - Pulmonary arteries: Systolic pressure was mildly increased. PA   peak pressure: 37 mm Hg (S).  Impressions:  - Myxomatous mitral valve with severe thicknening and severe   prolapse of both leaflets. Mitral regurgitation is most probably   severe, further evaluation with a TEE is recommended.  ------------------------------------------------------------------- Labs, prior tests, procedures, and surgery: Transthoracic echocardiography (11/04/2014).     EF was 60%.  ------------------------------------------------------------------- Study data:  Comparison was made to the study of 11/04/2014.  Study status:  Routine.  Procedure:  The patient reported no pain pre or post test. Transthoracic echocardiography. Image quality was adequate.   Study completion:  There were no complications. Transthoracic echocardiography.  M-mode, complete 2D, spectral Doppler, and color Doppler.  Birthdate:  Patient birthdate: 11-06-1950.  Age:  Patient is 65 yr old.  Sex:  Gender: female. BMI: 21.1 kg/m^2.  Blood pressure:     100/70  Patient status: Outpatient.  Study date:  Study date: 02/14/2016. Study time: 07:43 AM.  Location:  [facility]  -------------------------------------------------------------------  ------------------------------------------------------------------- Left ventricle:  The cavity size was normal. There was moderate concentric hypertrophy. Systolic function was vigorous. The estimated ejection fraction was in the range of 65% to 70%. Wall motion was normal; there were no regional wall motion abnormalities. Features are consistent with a pseudonormal left ventricular filling pattern, with concomitant abnormal relaxation and increased filling pressure (grade 2 diastolic dysfunction). Doppler parameters are consistent with elevated ventricular end-diastolic filling pressure.  ------------------------------------------------------------------- Aortic valve:   Trileaflet; normal thickness leaflets. Mobility was not restricted.  Doppler:  Transvalvular velocity was within the normal range. There was no stenosis. There was no regurgitation.   ------------------------------------------------------------------- Aorta:  Aortic root: The aortic root was normal in size.  ------------------------------------------------------------------- Mitral valve:  Myxomatous mitral valve with severe thicknening and severe prolapse of both leaflets. Mobility was not restricted. Doppler:   The findings are consistent with mild stenosis.   There was severe regurgitation.    Valve area by pressure half-time: 2.29 cm^2. Indexed valve area by pressure half-time: 1.35 cm^2/m^2. Valve area by continuity equation (using LVOT flow):  1.88 cm^2. Indexed valve area by continuity equation (using LVOT flow): 1.11 cm^2/m^2.    Mean gradient (D): 4 mm Hg. Peak gradient (D): 10 mm Hg.  ------------------------------------------------------------------- Left atrium:  The atrium was severely dilated.  ------------------------------------------------------------------- Right ventricle:  The cavity size was normal. Wall thickness was normal. Systolic function was normal.  ------------------------------------------------------------------- Pulmonic valve:    Structurally normal valve.   Cusp separation was normal.  Doppler:  Transvalvular velocity was within the normal range. There was no evidence for stenosis. There was trivial regurgitation.  ------------------------------------------------------------------- Tricuspid valve:   Structurally normal valve.    Doppler: Transvalvular velocity was within the normal range. There was mild regurgitation.  ------------------------------------------------------------------- Pulmonary artery:   The main pulmonary artery was normal-sized. Systolic pressure was mildly increased.  ------------------------------------------------------------------- Right atrium:  The atrium was normal in size.  ------------------------------------------------------------------- Pericardium:  There was no pericardial effusion.  ------------------------------------------------------------------- Systemic veins: Inferior vena cava: The vessel was normal in size.  ------------------------------------------------------------------- Measurements   Left ventricle                            Value          Reference  LV ID, ED, PLAX chordal           (L)     34.4  mm       43 - 52  LV ID, ES, PLAX chordal                   24.3  mm       23 - 38  LV fx shortening, PLAX chordal            29    %        >=29  LV PW thickness, ED                       14.4  mm       ---------  IVS/LV PW ratio,  ED  0.77           <=1.3  Stroke volume, 2D                         75    ml       ---------  Stroke volume/bsa, 2D                     44    ml/m^2   ---------  LV e&', lateral                            4.5   cm/s     ---------  LV E/e&', lateral                          35.11          ---------  LV e&', medial                             6.08  cm/s     ---------  LV E/e&', medial                           25.99          ---------  LV e&', average                            5.29  cm/s     ---------  LV E/e&', average                          29.87          ---------    Ventricular septum                        Value          Reference  IVS thickness, ED                         11.1  mm       ---------    LVOT                                      Value          Reference  LVOT ID, S                                21    mm       ---------  LVOT area                                 3.46  cm^2     ---------  LVOT peak velocity, S                     120   cm/s     ---------  LVOT mean velocity, S  71.8  cm/s     ---------  LVOT VTI, S                               21.7  cm       ---------  LVOT peak gradient, S                     6     mm Hg    ---------    Aorta                                     Value          Reference  Aortic root ID, ED                        30    mm       ---------    Left atrium                               Value          Reference  LA ID, A-P, ES                            40    mm       ---------  LA ID/bsa, A-P                    (H)     2.36  cm/m^2   <=2.2  LA volume, S                              143   ml       ---------  LA volume/bsa, S                          84.2  ml/m^2   ---------  LA volume, ES, 1-p A4C                    136   ml       ---------  LA volume/bsa, ES, 1-p A4C                80.1  ml/m^2   ---------  LA volume, ES, 1-p A2C                    141   ml       ---------  LA volume/bsa, ES, 1-p  A2C                83    ml/m^2   ---------    Mitral valve                              Value          Reference  Mitral E-wave peak velocity               158   cm/s     ---------  Mitral A-wave peak velocity  110   cm/s     ---------  Mitral mean velocity, D                   94.6  cm/s     ---------  Mitral deceleration time          (H)     289   ms       150 - 230  Mitral pressure half-time                 96    ms       ---------  Mitral mean gradient, D                   4     mm Hg    ---------  Mitral peak gradient, D                   10    mm Hg    ---------  Mitral E/A ratio, peak                    1.4            ---------  Mitral valve area, PHT, DP                2.29  cm^2     ---------  Mitral valve area/bsa, PHT, DP            1.35  cm^2/m^2 ---------  Mitral valve area, LVOT                   1.88  cm^2     ---------  continuity  Mitral valve area/bsa, LVOT               1.11  cm^2/m^2 ---------  continuity  Mitral annulus VTI, D                     39.9  cm       ---------  Mitral regurg VTI, PISA                   187   cm       ---------  Mitral ERO, PISA                          0.55  cm^2     ---------  Mitral regurg volume, PISA                103   ml       ---------    Pulmonary arteries                        Value          Reference  PA pressure, S, DP                (H)     37    mm Hg    <=30    Tricuspid valve                           Value          Reference  Tricuspid regurg peak velocity            269   cm/s     ---------  Tricuspid peak RV-RA gradient  29    mm Hg    ---------    Systemic veins                            Value          Reference  Estimated CVP                             8     mm Hg    ---------    Right ventricle                           Value          Reference  RV pressure, S, DP                (H)     37    mm Hg    <=30  RV s&', lateral, S                         13.2  cm/s     ---------     Pulmonic valve                            Value          Reference  Pulmonic regurg velocity, ED              121   cm/s     ---------  Pulmonic regurg gradient, ED              6     mm Hg    ---------  Legend: (L)  and  (H)  mark values outside specified reference range.  ------------------------------------------------------------------- Prepared and Electronically Authenticated by  Ena Dawley, M.D. 2017-09-27T08:47:01   Transesophageal Echocardiography  Patient:    Baker, Kogler MR #:       [id number] Study Date: 02/22/2016 Gender:     F Age:        65 Height:     170.2 cm Weight:     61.2 kg BSA:        1.7 m^2 Pt. Status: Room:   ADMITTING    Ena Dawley, M.D.  ATTENDING    Ena Dawley, M.D.  ORDERING     Ena Dawley, M.D.  PERFORMING   Ena Dawley, M.D.  REFERRING    Ena Dawley, M.D.  SONOGRAPHER  Roseanna Rainbow  cc:  ------------------------------------------------------------------- LV EF: 60% -   65%  ------------------------------------------------------------------- Indications:      Mitral valve prolapse 424.0.  ------------------------------------------------------------------- History:   PMH:  Scleroderma. Severe mitral regurgitation.  Risk factors:  Current tobacco use.  ------------------------------------------------------------------- Study Conclusions  - Left ventricle: There was concentric hypertrophy. Systolic   function was normal. The estimated ejection fraction was in the   range of 60% to 65%. Wall motion was normal; there were no   regional wall motion abnormalities. - Mitral valve: Severely thickened myxomatous mitral valve   involving both of the leaflet with severe prolapse and severe   mitral regurgitation that is hitting the posterior portion of the   left atrium and swirling back with clear systolic reversible flow   in pulmonary veins forward flow. PISA is 1.2 and regurgitant   volume 93 mL.  There was severe regurgitation. - Left  atrium: The atrium was severely dilated. No evidence of   thrombus in the atrial cavity or appendage. No evidence of   thrombus in the atrial cavity or appendage. - Right atrium: The atrium was dilated. No evidence of thrombus in   the atrial cavity or appendage. - Atrial septum: No defect or patent foramen ovale was identified. - Tricuspid valve: There is also myxomatous degeneration and   prolapse of the posterior tricuspid valve leaflet associated with   multiple small jets collectively causing moderate regurgitation.   There was moderate regurgitation. - Pulmonary arteries: Systolic pressure was moderately increased.   PA peak pressure: 46 mm Hg (S).  Impressions:  - Myxomatous changes associated with severe prolapse and severe   mitral regurgitation. Also myxomatous changes in the tricuspid   valve associated with moderate tricuspid regurgitation. There is   moderate pulmonary hypertension.   This is most probably secondary to inherited connective tissue   disorder.  ------------------------------------------------------------------- Study data:   Study status:  Routine.  Consent:  The risks, benefits, and alternatives to the procedure were explained to the patient and informed consent was obtained.  Procedure:  Initial setup. The patient was brought to the laboratory. Surface ECG leads were monitored. Sedation. Conscious sedation was administered by cardiology staff. Transesophageal echocardiography. An adult multiplane transesophageal probe was inserted by the attending cardiologistwithout difficulty. Image quality was adequate.  Study completion:  The patient tolerated the procedure well. There were no complications.  Administered medications:   Fentanyl, 64mg, IV.  Midazolam, '4mg'$ , IV.          Diagnostic transesophageal echocardiography.  2D and color Doppler.  Birthdate:  Patient birthdate: 003-Apr-1952  Age:  Patient is 65yr  old.  Sex:  Gender: female.    BMI: 21.1 kg/m^2.  Blood pressure:     102/45  Patient status:  Inpatient.  Study date:  Study date: 02/22/2016. Study time: 12:54 PM.  Location:  Endoscopy.  -------------------------------------------------------------------  ------------------------------------------------------------------- Left ventricle:  There was concentric hypertrophy. Systolic function was normal. The estimated ejection fraction was in the range of 60% to 65%. Wall motion was normal; there were no regional wall motion abnormalities.  ------------------------------------------------------------------- Aortic valve:   Structurally normal valve. Trileaflet; normal thickness leaflets. Cusp separation was normal.  Doppler:  There was no significant regurgitation.  ------------------------------------------------------------------- Aorta:  There was no atheroma. There was no evidence for dissection. Aortic root: The aortic root was not dilated. Ascending aorta: The ascending aorta was normal in size. Descending aorta: The descending aorta was normal in size. There was mild non-mobie plaque.  ------------------------------------------------------------------- Mitral valve:  Severely thickened myxomatous mitral valve involving both of the leaflet with severe prolapse and severe mitral regurgitation that is hitting the posterior portion of the left atrium and swirling back with clear systolic reversible flow in pulmonary veins forward flow. PISA is 1.2 and regurgitant volume 93 mL.  Structurally normal valve.   Leaflet separation was normal. Doppler:  There was severe regurgitation.  ------------------------------------------------------------------- Left atrium:  The atrium was severely dilated.  No evidence of thrombus in the atrial cavity or appendage.  No evidence of thrombus in the atrial cavity or appendage. The appendage was morphologically a left appendage,  multilobulated, and of normal size. Emptying velocity was normal.  ------------------------------------------------------------------- Atrial septum:  No defect or patent foramen ovale was identified.   ------------------------------------------------------------------- Right ventricle:  The cavity size was normal. Wall thickness was normal. Systolic function was normal.  ------------------------------------------------------------------- Pulmonic valve:    Structurally  normal valve.    Doppler:  There was mild regurgitation.  ------------------------------------------------------------------- Tricuspid valve:  There is also myxomatous degeneration and prolapse of the posterior tricuspid valve leaflet associated with multiple small jets collectively causing moderate regurgitation. Structurally normal valve.   Leaflet separation was normal. Doppler:  There was moderate regurgitation.  ------------------------------------------------------------------- Pulmonary artery:   The main pulmonary artery was mildly dilated. Systolic pressure was moderately increased.  ------------------------------------------------------------------- Right atrium:  The atrium was dilated.  No evidence of thrombus in the atrial cavity or appendage. The appendage was morphologically a right appendage.  ------------------------------------------------------------------- Pericardium:  The pericardium was normal in appearance. There was no pericardial effusion.  ------------------------------------------------------------------- Measurements   Mitral valve                          Value       Reference  Mitral regurg VTI, PISA               211   cm    ---------  Mitral ERO, PISA                      0.44  cm^2  ---------  Mitral regurg volume, PISA            93    ml    ---------    Pulmonary arteries                    Value       Reference  PA pressure, S, DP             (H)    46    mm Hg  <=30    Tricuspid valve                       Value       Reference  Tricuspid regurg peak velocity        327   cm/s  ---------  Tricuspid peak RV-RA gradient         43    mm Hg ---------  Legend: (L)  and  (H)  mark values outside specified reference range.  ------------------------------------------------------------------- Prepared and Electronically Authenticated by  Ena Dawley, M.D. 2017-10-05T16:42:29   Impression:  Patient has stage D severe symptomatic primary mitral regurgitation. I personally reviewed her recent transthoracic and transesophageal echocardiograms. 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 There is severe thickening with excessive leaflet tissue involving both the anterior and the posterior leaflets. There is elongation of most if not all of the primary chordae tendineae to both leaflets. The jet of regurgitation is central and crosses the entire left atrium. There do not appear to be any ruptured chordae tendineae or flail segments.  Using PISA the ERO was measured 0.44 centimeters squared corresponding to a regurgitant volume estimated 93 mL.  There was flow reversal in the pulmonary veins. The left atrium is severely dilated. Left ventricular size and systolic function appears normal. There is some myxomatous degenerative prolapse involving the tricuspid valve with mild to moderate tricuspid regurgitation.  I agree the patient would probably best be treated with elective mitral valve repair. Based upon review of the transesophageal echocardiogram, I feel there remained a significant likelihood that her valve may be repairable, although the likelihood of successful and durable repair is probably somewhat less than 95% due to the extensive thickening and fibrosis  with severe bileaflet prolapse. There may be significant calcification as well. The patient's symptoms of exertional shortness of breath  are probably multifactorial but certainly consistent with the presence of chronic diastolic congestive heart failure, [place] Heart Association functional class II. I suspect that at least some degree of the patient's symptoms are related to her underlying chronic lung disease with history of previous wedge resection 2. Finally, the patient appears to be doing well with respect to her history of lung cancer that has been surgically resected, but her long-term prognosis is probably somewhat muted under the circumstances.   Plan:  The patient was counseled at length regarding the indications, risks and potential benefits of mitral valve repair.  Several family members listened in to her consultation over the telephone. The rationale for elective surgery has been explained, including a comparison between surgery and continued medical therapy with close follow-up.  The likelihood of successful and durable valve repair has been discussed with particular reference to the findings of their recent echocardiogram.  Based upon these findings and previous experience, I have quoted them a greater than 50 percent likelihood of successful valve repair.  In the unlikely event that their valve cannot be successfully repaired, we discussed the possibility of replacing the mitral valve using a mechanical prosthesis with the attendant need for long-term anticoagulation versus the alternative of replacing it using a bioprosthetic tissue valve with its potential for late structural valve deterioration and failure, depending upon the patient's longevity.  Alternative surgical approaches have been discussed including a comparison between conventional median sternotomy and minimally invasive techniques. All of the patient's questions have been addressed. She is interested in proceeding with further diagnostic testing to consider elective surgery in the near future.  As a next step the patient will need to undergo left and right  heart catheterization. We will obtain formal pulmonary function tests. Finally, we will obtain a nuclear medicine PET/CT scan for follow-up of her lung cancer. Depending upon the results of these tests we will consider alternative surgical approaches, and if the patient is interested in a minimally invasive approach we will obtain CT angiogram of the aorta and iliac vessels. The patient will return to our office in 2-3 weeks to review the results of these tests and discuss treatment options further.  The patient has been strongly advised that she must stop smoking completely. She needs to stop smoking before we perform surgery, and if she can't stop smoking permanently her long-term outcome will likely be adversely affected.  I spent in excess of 90 minutes during the conduct of this office consultation and >50% of this time involved direct face-to-face encounter with the patient for counseling and/or coordination of their care.   Valentina Gu. Roxy Manns, MD 02/29/2016 6:36 PM

## 2016-02-29 NOTE — Patient Instructions (Signed)
Continue all previous medications without any changes at this time  Stop smoking immediately and permanently.

## 2016-03-01 ENCOUNTER — Other Ambulatory Visit: Payer: Self-pay | Admitting: *Deleted

## 2016-03-01 DIAGNOSIS — Z85118 Personal history of other malignant neoplasm of bronchus and lung: Secondary | ICD-10-CM

## 2016-03-01 DIAGNOSIS — I340 Nonrheumatic mitral (valve) insufficiency: Secondary | ICD-10-CM

## 2016-03-06 ENCOUNTER — Other Ambulatory Visit: Payer: Self-pay | Admitting: Cardiovascular Disease

## 2016-03-06 DIAGNOSIS — I340 Nonrheumatic mitral (valve) insufficiency: Secondary | ICD-10-CM

## 2016-03-08 ENCOUNTER — Encounter (HOSPITAL_COMMUNITY)
Admission: RE | Disposition: A | Discharge status: Home / Self Care | Payer: Self-pay | Source: Ambulatory Visit | Attending: Cardiovascular Disease

## 2016-03-08 ENCOUNTER — Ambulatory Visit (HOSPITAL_COMMUNITY)
Admission: RE | Admit: 2016-03-08 | Discharge: 2016-03-08 | Disposition: A | Discharge status: Home / Self Care | Payer: Medicare Other | Source: Ambulatory Visit | Attending: Cardiovascular Disease | Admitting: Cardiovascular Disease

## 2016-03-08 DIAGNOSIS — F17210 Nicotine dependence, cigarettes, uncomplicated: Secondary | ICD-10-CM | POA: Insufficient documentation

## 2016-03-08 DIAGNOSIS — E039 Hypothyroidism, unspecified: Secondary | ICD-10-CM | POA: Diagnosis not present

## 2016-03-08 DIAGNOSIS — Z9221 Personal history of antineoplastic chemotherapy: Secondary | ICD-10-CM | POA: Insufficient documentation

## 2016-03-08 DIAGNOSIS — Z85118 Personal history of other malignant neoplasm of bronchus and lung: Secondary | ICD-10-CM | POA: Diagnosis not present

## 2016-03-08 DIAGNOSIS — J449 Chronic obstructive pulmonary disease, unspecified: Secondary | ICD-10-CM | POA: Insufficient documentation

## 2016-03-08 DIAGNOSIS — I340 Nonrheumatic mitral (valve) insufficiency: Secondary | ICD-10-CM | POA: Diagnosis present

## 2016-03-08 DIAGNOSIS — Z880 Allergy status to penicillin: Secondary | ICD-10-CM | POA: Diagnosis not present

## 2016-03-08 DIAGNOSIS — I7300 Raynaud's syndrome without gangrene: Secondary | ICD-10-CM | POA: Insufficient documentation

## 2016-03-08 DIAGNOSIS — I272 Pulmonary hypertension, unspecified: Secondary | ICD-10-CM | POA: Insufficient documentation

## 2016-03-08 DIAGNOSIS — Z8249 Family history of ischemic heart disease and other diseases of the circulatory system: Secondary | ICD-10-CM | POA: Diagnosis not present

## 2016-03-08 DIAGNOSIS — I081 Rheumatic disorders of both mitral and tricuspid valves: Secondary | ICD-10-CM | POA: Insufficient documentation

## 2016-03-08 DIAGNOSIS — Z7982 Long term (current) use of aspirin: Secondary | ICD-10-CM | POA: Insufficient documentation

## 2016-03-08 DIAGNOSIS — I341 Nonrheumatic mitral (valve) prolapse: Secondary | ICD-10-CM | POA: Diagnosis present

## 2016-03-08 HISTORY — PX: CARDIAC CATHETERIZATION: SHX172

## 2016-03-08 LAB — POCT I-STAT 3, ART BLOOD GAS (G3+)
Acid-Base Excess: 1 mmol/L (ref 0.0–2.0)
Bicarbonate: 26.1 mmol/L (ref 20.0–28.0)
O2 Saturation: 96 %
PCO2 ART: 41 mmHg (ref 32.0–48.0)
PH ART: 7.411 (ref 7.350–7.450)
TCO2: 27 mmol/L (ref 0–100)
pO2, Arterial: 82 mmHg — ABNORMAL LOW (ref 83.0–108.0)

## 2016-03-08 LAB — POCT I-STAT 3, VENOUS BLOOD GAS (G3P V)
Acid-Base Excess: 3 mmol/L — ABNORMAL HIGH (ref 0.0–2.0)
Bicarbonate: 28.5 mmol/L — ABNORMAL HIGH (ref 20.0–28.0)
O2 Saturation: 67 %
PCO2 VEN: 47.3 mmHg (ref 44.0–60.0)
PH VEN: 7.387 (ref 7.250–7.430)
PO2 VEN: 35 mmHg (ref 32.0–45.0)
TCO2: 30 mmol/L (ref 0–100)

## 2016-03-08 LAB — CBC
HCT: 33.9 % — ABNORMAL LOW (ref 36.0–46.0)
Hemoglobin: 11 g/dL — ABNORMAL LOW (ref 12.0–15.0)
MCH: 27.6 pg (ref 26.0–34.0)
MCHC: 32.4 g/dL (ref 30.0–36.0)
MCV: 85 fL (ref 78.0–100.0)
PLATELETS: 216 10*3/uL (ref 150–400)
RBC: 3.99 MIL/uL (ref 3.87–5.11)
RDW: 13.6 % (ref 11.5–15.5)
WBC: 7.1 10*3/uL (ref 4.0–10.5)

## 2016-03-08 LAB — BASIC METABOLIC PANEL
Anion gap: 5 (ref 5–15)
BUN: 10 mg/dL (ref 6–20)
CHLORIDE: 99 mmol/L — AB (ref 101–111)
CO2: 29 mmol/L (ref 22–32)
CREATININE: 0.81 mg/dL (ref 0.44–1.00)
Calcium: 9.3 mg/dL (ref 8.9–10.3)
GFR calc Af Amer: >60 mL/min (ref >60)
GFR calc non Af Amer: >60 mL/min (ref >60)
Glucose, Bld: 86 mg/dL (ref 65–99)
Potassium: 4 mmol/L (ref 3.5–5.1)
SODIUM: 133 mmol/L — AB (ref 135–145)

## 2016-03-08 LAB — PROTIME-INR
INR: 1.02
PROTHROMBIN TIME: 13.4 s (ref 11.4–15.2)

## 2016-03-08 SURGERY — RIGHT/LEFT HEART CATH AND CORONARY ANGIOGRAPHY

## 2016-03-08 MED ORDER — MIDAZOLAM HCL 2 MG/2ML IJ SOLN
INTRAMUSCULAR | Status: AC
  Filled 2016-03-08: qty 2

## 2016-03-08 MED ORDER — IOPAMIDOL (ISOVUE-370) INJECTION 76%
INTRAVENOUS | Status: DC | PRN
  Administered 2016-03-08: 30 mL via INTRAVENOUS

## 2016-03-08 MED ORDER — SODIUM CHLORIDE 0.9 % WEIGHT BASED INFUSION: 3.0000 mL/kg/h | INTRAVENOUS | Status: AC

## 2016-03-08 MED ORDER — HEPARIN SODIUM (PORCINE) 1000 UNIT/ML IJ SOLN
INTRAMUSCULAR | Status: DC | PRN
  Administered 2016-03-08: 3000 [IU] via INTRAVENOUS

## 2016-03-08 MED ORDER — HEPARIN (PORCINE) IN NACL 2-0.9 UNIT/ML-% IJ SOLN
INTRAMUSCULAR | Status: AC
  Filled 2016-03-08: qty 1500

## 2016-03-08 MED ORDER — SODIUM CHLORIDE 0.9 % WEIGHT BASED INFUSION: 1.0000 mL/kg/h | INTRAVENOUS | Status: DC

## 2016-03-08 MED ORDER — FENTANYL CITRATE (PF) 100 MCG/2ML IJ SOLN
INTRAMUSCULAR | Status: DC | PRN
  Administered 2016-03-08 (×2): 25 ug via INTRAVENOUS

## 2016-03-08 MED ORDER — LIDOCAINE HCL (PF) 1 % IJ SOLN
INTRAMUSCULAR | Status: DC | PRN
  Administered 2016-03-08: 1 mL
  Administered 2016-03-08: 3 mL

## 2016-03-08 MED ORDER — SODIUM CHLORIDE 0.9% FLUSH: 3.0000 mL | INTRAVENOUS | Status: DC | PRN

## 2016-03-08 MED ORDER — ONDANSETRON HCL 4 MG/2ML IJ SOLN: 4.0000 mg | Freq: Four times a day (QID) | INTRAMUSCULAR | Status: DC | PRN

## 2016-03-08 MED ORDER — MIDAZOLAM HCL 2 MG/2ML IJ SOLN
INTRAMUSCULAR | Status: DC | PRN
  Administered 2016-03-08 (×2): 1 mg via INTRAVENOUS

## 2016-03-08 MED ORDER — LIDOCAINE HCL (PF) 1 % IJ SOLN
INTRAMUSCULAR | Status: AC
  Filled 2016-03-08: qty 30

## 2016-03-08 MED ORDER — SODIUM CHLORIDE 0.9 % IV SOLN: 250.0000 mL | INTRAVENOUS | Status: DC | PRN

## 2016-03-08 MED ORDER — FENTANYL CITRATE (PF) 100 MCG/2ML IJ SOLN
INTRAMUSCULAR | Status: AC
  Filled 2016-03-08: qty 2

## 2016-03-08 MED ORDER — HEPARIN (PORCINE) IN NACL 2-0.9 UNIT/ML-% IJ SOLN
INTRAMUSCULAR | Status: DC | PRN
  Administered 2016-03-08: 10 mL via INTRA_ARTERIAL

## 2016-03-08 MED ORDER — IOPAMIDOL (ISOVUE-370) INJECTION 76%
INTRAVENOUS | Status: AC
  Filled 2016-03-08: qty 100

## 2016-03-08 MED ORDER — HEPARIN SODIUM (PORCINE) 1000 UNIT/ML IJ SOLN
INTRAMUSCULAR | Status: AC
  Filled 2016-03-08: qty 1

## 2016-03-08 MED ORDER — SODIUM CHLORIDE 0.9% FLUSH: 3.0000 mL | Freq: Two times a day (BID) | INTRAVENOUS | Status: DC

## 2016-03-08 MED ORDER — VERAPAMIL HCL 2.5 MG/ML IV SOLN
INTRAVENOUS | Status: AC
  Filled 2016-03-08: qty 2

## 2016-03-08 MED ORDER — ACETAMINOPHEN 325 MG PO TABS: 650.0000 mg | ORAL_TABLET | ORAL | Status: DC | PRN

## 2016-03-08 MED ORDER — SODIUM CHLORIDE 0.9 % WEIGHT BASED INFUSION
3.0000 mL/kg/h | INTRAVENOUS | Status: DC
  Administered 2016-03-08: 3 mL/kg/h via INTRAVENOUS

## 2016-03-08 MED ORDER — HEPARIN (PORCINE) IN NACL 2-0.9 UNIT/ML-% IJ SOLN
INTRAMUSCULAR | Status: DC | PRN
  Administered 2016-03-08: 1500 mL

## 2016-03-08 SURGICAL SUPPLY — 14 items
CATH BALLN WEDGE 5F 110CM (CATHETERS) ×2 IMPLANT
CATH EXPO 5F MPA-1 (CATHETERS) ×2 IMPLANT
CATH INFINITI 5 FR JL3.5 (CATHETERS) ×2 IMPLANT
CATH INFINITI JR4 5F (CATHETERS) ×2 IMPLANT
DEVICE RAD COMP TR BAND LRG (VASCULAR PRODUCTS) ×4 IMPLANT
GLIDESHEATH SLEND SS 6F .021 (SHEATH) ×2 IMPLANT
GUIDEWIRE .025 260CM (WIRE) ×2 IMPLANT
KIT HEART LEFT (KITS) ×2 IMPLANT
PACK CARDIAC CATHETERIZATION (CUSTOM PROCEDURE TRAY) ×2 IMPLANT
SHEATH FAST CATH BRACH 5F 5CM (SHEATH) ×2 IMPLANT
TRANSDUCER W/STOPCOCK (MISCELLANEOUS) ×2 IMPLANT
TUBING CIL FLEX 10 FLL-RA (TUBING) ×2 IMPLANT
WIRE EMERALD 3MM-J .035X260CM (WIRE) ×2 IMPLANT
WIRE HI TORQ VERSACORE-J 145CM (WIRE) ×2 IMPLANT

## 2016-03-08 NOTE — H&P (View-Only) (Signed)
[address]       ,[place] [address]             [phone]     CARDIOTHORACIC SURGERY CONSULTATION REPORT  Referring Provider is Dorothy Spark, MD PCP is Velna Hatchet, MD  Chief Complaint  Patient presents with  . 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 The patient states that she has been known to have mitral valve prolapse for most of her adult life. She has several other close family members with mitral valve prolapse and her mother underwent mitral valve replacement 2 in the past. For many years she was followed by a cardiologist in [place]. She retired from the [employer] in November 2015 and moved to [place] to be near her family. The patient describes a long history of intermittent dizzy spells and ultimately sought to establish care with a cardiologist locally in [place]. She was initially evaluated by Dr. Meda Coffee in June 2016. Transthoracic echocardiogram performed at that time revealed normal left ventricular systolic function with severe bileaflet prolapse and what was felt to be mild to moderate mitral regurgitation.  At approximately the same time she was diagnosed with lung cancer and initially was evaluated locally in [place] but ultimately underwent surgical resection at [facility]. She was found to have synchronous primary tumors in the left lung, each of  which were treated with wedge resection. She underwent a total of 4 cycles of adjuvant chemotherapy postoperatively. Her most recent follow-up chest CT scan was performed in March 2017 at which time there was no sign of locally recurrent disease. She was seen in follow-up recently by Dr. Elenor Quinones at [facility] who plans to see the patient again in approximately 6 months with a repeat chest CT scan at that time.  More recently the patient returned to Dr. Francesca Oman office for follow-up with complaints of intermittent dizzy spells. The patient notes that her blood pressure has always run somewhat on the low side.  Over the last few months she has developed worsening exertional shortness of breath.  She was seen in follow-up recently by Dr. Meda Coffee and transthoracic echocardiogram revealed severe mitral regurgitation. Left ventricular systolic function remained normal with ejection fraction estimated 65-70%. There was mild pulmonary hypertension, severe left atrial enlargement, and mild tricuspid regurgitation.  Transesophageal echocardiogram was performed 02/22/2016. This confirmed the presence of bileaflet prolapse of the mitral valve with severe mitral regurgitation. ERO was measured 0.44 cm corresponding to an estimated regurgitant volume of 93 mL.  There was flow reversal in the pulmonary veins. Left ventricular systolic function appeared normal with ejection fraction estimated 60-65%. There was severe left atrial enlargement. There was prolapse of the posterior leaflet of the tricuspid valve with moderate tricuspid regurgitation. Pulmonary artery pressures were felt to be moderately increased.  The patient was referred for surgical consultation.  The patient is single and lives locally in [place], [place].  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 She retired in 2015. She remains entirely functionally independent. She does not exercise on a regular basis. She admits to a  long history of exertional shortness of breath which she states has progressed somewhat over the last few months. She does not get short of breath with ordinary activities but only if she does something more strenuous. She denies any history of resting shortness breath, PND, orthopnea, or lower extremity edema. She denies any palpitations. She has frequent dizzy spells with no history of syncope. She denies any history of exertional chest pain or chest tightness.  She states that she quit smoking at the time she had surgery for lung cancer, but she resumed smoking over the past year, currently smoking 4 or 5 cigarettes per day.  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 The patient does have Raynaud's disease but none of the other associated findings. 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 Approximately 5 months ago she underwent Botox injection of her vocal cord with successful restoration of her previous normal voice.   Past Medical History:  Diagnosis Date  . Allergy   . Asthma   . Complication of anesthesia   . COPD (chronic obstructive pulmonary disease) ([place])   . Family history of adverse reaction to anesthesia    Mother- very sensitive to medication  . Heart murmur    MVP  . Hypothyroidism   . Lung cancer ([employer]) 12/30/2014   Synchronous primaries:  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, each treated by wedge resection with post-op adjuvant [facility]Rx at [facility]  . MVP (mitral valve prolapse)   . PONV (postoperative nausea and vomiting)   . Raynaud's disease   . Severe mitral regurgitation 11/15/2014  . Shortness of breath dyspnea    with exertion  . Thyroid disease     Past Surgical History:  Procedure Laterality Date  . BACK SURGERY     x 3  Disectomy  . CLAVICLE SURGERY Left 2013   plate to left collar bone  . LAPAROSCOPY     ? reason-age 7   . TEE WITHOUT  CARDIOVERSION N/A 02/22/2016   Procedure: TRANSESOPHAGEAL ECHOCARDIOGRAM (TEE);  Surgeon: Dorothy Spark, MD;  Location: [facility];  Service: Cardiovascular;  Laterality: N/A;  . TONSILLECTOMY    . VIDEO ASSISTED THORACOSCOPY (VATS)/WEDGE RESECTION Left 12/30/2014   Bronchoscopy, Mediastinoscopy, Left VATS for Wedge resection LUL x2 adn LLL x1 - Dr. Elenor Quinones at [facility]  . wrist surgery Left 2015   plate to wrist     Family History  Problem Relation Age of Onset  . Mitral valve prolapse Mother   . Dementia Father   . Prostate cancer Father   . Mitral valve prolapse Brother   . Mitral valve prolapse Sister   . Colon cancer Neg Hx   . Colon polyps Neg Hx   . Rectal cancer Neg Hx   . Esophageal cancer Neg Hx   . Stomach cancer Neg Hx   . Heart attack Neg Hx     Social History   Social History  . Marital status: Divorced    Spouse name: N/A  . Number of children: N/A  . Years of education: N/A   Occupational History  . Not on file.   Social History Main Topics  . Smoking status: Current Every Day Smoker  Packs/day: 0.00    Years: 24.00    Types: Cigarettes  . Smokeless tobacco: Never Used     Comment: SMOKES ABOUT 4 CIGS A DAY  . Alcohol use 0.6 oz/week    1 Shots of liquor per week     Comment: socially  . Drug use: No  . Sexual activity: Not on file   Other Topics Concern  . Not on file   Social History Narrative  . No narrative on file    Current Outpatient Prescriptions  Medication Sig Dispense Refill  . aspirin EC 81 MG tablet Take 81 mg by mouth daily.    . DULERA 100-5 MCG/ACT AERO USE 2 PUFFS FIRST THING IN THE MORNING AND 2 PUFFS 12 HOURS LATER 3 Inhaler 0  . ibuprofen (ADVIL,MOTRIN) 200 MG tablet Take 200 mg by mouth every 8 (eight) hours as needed for fever, headache or cramping.     Marland Kitchen levothyroxine (SYNTHROID, LEVOTHROID) 175 MCG tablet Take 175 mcg by mouth daily before breakfast.    . Multiple Vitamins-Minerals (MULTI VITAMIN/MINERALS PO) Take  1 tablet by mouth daily.     . Omega-3 Fatty Acids (FISH OIL PO) Take 1 capsule by mouth daily.     . pseudoephedrine (SUDAFED) 30 MG tablet Take 30 mg by mouth every 4 (four) hours as needed for congestion.    . pseudoephedrine-guaifenesin (MUCINEX D) 60-600 MG 12 hr tablet Take 1 tablet by mouth every 12 (twelve) hours.    . Zinc 30 MG TABS Take 1 tablet by mouth daily.     No current facility-administered medications for this visit.     Allergies  Allergen Reactions  . Penicillins Hives    Unknown, occurred as a child      Review of Systems:   General:  normal appetite, decreased energy, no weight gain, no weight loss, no fever  Cardiac:  no chest pain with exertion, no chest pain at rest, + SOB with exertion, no resting SOB, no PND, no orthopnea, no palpitations, no arrhythmia, no atrial fibrillation, no LE edema, + dizzy spells, no syncope  Respiratory:  + exertional shortness of breath, no home oxygen, no productive cough, + chronic dry cough that is typically worse in the mornings, intermittent bronchitis, no wheezing, no hemoptysis, no asthma, no pain with inspiration or cough, no sleep apnea, no CPAP at night  GI:   no difficulty swallowing, no reflux, no frequent heartburn, no hiatal hernia, no abdominal pain, no constipation, no diarrhea, no hematochezia, no hematemesis, no melena  GU:   no dysuria,  no frequency, no urinary tract infection, no hematuria, no kidney stones, no kidney disease  Vascular:  no pain suggestive of claudication, no pain in feet, + occasional leg cramps, no varicose veins, no DVT, no non-healing foot ulcer  Neuro:   no stroke, no TIA's, no seizures, no headaches, no temporary blindness one eye,  no slurred speech, no peripheral neuropathy, no chronic pain, no instability of gait, no memory/cognitive dysfunction  Musculoskeletal: no arthritis, no joint swelling, no myalgias, no difficulty walking, normal mobility   Skin:   no rash, no itching, no skin  infections, no pressure sores or ulcerations  Psych:   no anxiety, no depression, no nervousness, no unusual recent stress  Eyes:   no blurry vision, no floaters, no recent vision changes, + wears glasses or contacts  ENT:   + hearing loss, no loose or painful teeth, no dentures, last saw dentist within the past year  Hematologic:  +  easy bruising, no abnormal bleeding, no clotting disorder, no frequent epistaxis  Endocrine:  no diabetes, does not check CBG's at home     Physical Exam:   BP (!) 102/51   Pulse 77   Resp 16   Ht '5\' 7"'$  (1.702 m)   Wt 135 lb (61.2 kg)   SpO2 98% Comment: ON RA  BMI 21.14 kg/m   General:  Thin, well-appearing  HEENT:  Unremarkable   Neck:   no JVD, no bruits, no adenopathy   Chest:   clear to auscultation, symmetrical breath sounds, no wheezes, no rhonchi   CV:   RRR, grade III/VI systolic murmur   Abdomen:  soft, non-tender, no masses   Extremities:  warm, well-perfused, pulses diminished but palpable, no LE edema  Rectal/GU  Deferred  Neuro:   Grossly non-focal and symmetrical throughout  Skin:   Clean and dry, no rashes, no breakdown   Diagnostic Tests:  Transthoracic Echocardiography  Patient:    Joy Williams, Joy Williams MR #:       [id number] Study Date: 02/14/2016 Gender:     F Age:        65 Height:     170.2 cm Weight:     61.1 kg BSA:        1.7 m^2 Pt. Status: Room:   ATTENDING    Jenkins Rouge, M.D.  SONOGRAPHER  [place], RDCS  ORDERING     Ena Dawley, M.D.  REFERRING    Ena Dawley, M.D.  PERFORMING   [facility], Outpatient  cc:  ------------------------------------------------------------------- LV EF: 65% -   70%  ------------------------------------------------------------------- Indications:      Mitral regurgitation (I34).  ------------------------------------------------------------------- History:   Risk factors:  Scleroderma. Current tobacco  use.  ------------------------------------------------------------------- Study Conclusions  - Left ventricle: The cavity size was normal. There was moderate   concentric hypertrophy. Systolic function was vigorous. The   estimated ejection fraction was in the range of 65% to 70%. Wall   motion was normal; there were no regional wall motion   abnormalities. Features are consistent with a pseudonormal left   ventricular filling pattern, with concomitant abnormal relaxation   and increased filling pressure (grade 2 diastolic dysfunction).   Doppler parameters are consistent with elevated ventricular   end-diastolic filling pressure. - Aortic valve: Trileaflet; normal thickness leaflets. There was no   regurgitation. - Aortic root: The aortic root was normal in size. - Mitral valve: Myxomatous mitral valve with severe thicknening and   severe prolapse of both leaflets. The findings are consistent   with mild stenosis. There was severe regurgitation. - Left atrium: The atrium was severely dilated. - Right ventricle: Systolic function was normal. - Tricuspid valve: There was mild regurgitation. - Pulmonic valve: There was trivial regurgitation. - Pulmonary arteries: Systolic pressure was mildly increased. PA   peak pressure: 37 mm Hg (S).  Impressions:  - Myxomatous mitral valve with severe thicknening and severe   prolapse of both leaflets. Mitral regurgitation is most probably   severe, further evaluation with a TEE is recommended.  ------------------------------------------------------------------- Labs, prior tests, procedures, and surgery: Transthoracic echocardiography (11/04/2014).     EF was 60%.  ------------------------------------------------------------------- Study data:  Comparison was made to the study of 11/04/2014.  Study status:  Routine.  Procedure:  The patient reported no pain pre or post test. Transthoracic echocardiography. Image quality was adequate.   Study completion:  There were no complications. Transthoracic echocardiography.  M-mode, complete 2D, spectral Doppler, and color Doppler.  Birthdate:  Patient birthdate: 1951/02/16.  Age:  Patient is 66 yr old.  Sex:  Gender: female. BMI: 21.1 kg/m^2.  Blood pressure:     100/70  Patient status: Outpatient.  Study date:  Study date: 02/14/2016. Study time: 07:43 AM.  Location:  [facility]  -------------------------------------------------------------------  ------------------------------------------------------------------- Left ventricle:  The cavity size was normal. There was moderate concentric hypertrophy. Systolic function was vigorous. The estimated ejection fraction was in the range of 65% to 70%. Wall motion was normal; there were no regional wall motion abnormalities. Features are consistent with a pseudonormal left ventricular filling pattern, with concomitant abnormal relaxation and increased filling pressure (grade 2 diastolic dysfunction). Doppler parameters are consistent with elevated ventricular end-diastolic filling pressure.  ------------------------------------------------------------------- Aortic valve:   Trileaflet; normal thickness leaflets. Mobility was not restricted.  Doppler:  Transvalvular velocity was within the normal range. There was no stenosis. There was no regurgitation.   ------------------------------------------------------------------- Aorta:  Aortic root: The aortic root was normal in size.  ------------------------------------------------------------------- Mitral valve:  Myxomatous mitral valve with severe thicknening and severe prolapse of both leaflets. Mobility was not restricted. Doppler:   The findings are consistent with mild stenosis.   There was severe regurgitation.    Valve area by pressure half-time: 2.29 cm^2. Indexed valve area by pressure half-time: 1.35 cm^2/m^2. Valve area by continuity equation (using LVOT flow):  1.88 cm^2. Indexed valve area by continuity equation (using LVOT flow): 1.11 cm^2/m^2.    Mean gradient (D): 4 mm Hg. Peak gradient (D): 10 mm Hg.  ------------------------------------------------------------------- Left atrium:  The atrium was severely dilated.  ------------------------------------------------------------------- Right ventricle:  The cavity size was normal. Wall thickness was normal. Systolic function was normal.  ------------------------------------------------------------------- Pulmonic valve:    Structurally normal valve.   Cusp separation was normal.  Doppler:  Transvalvular velocity was within the normal range. There was no evidence for stenosis. There was trivial regurgitation.  ------------------------------------------------------------------- Tricuspid valve:   Structurally normal valve.    Doppler: Transvalvular velocity was within the normal range. There was mild regurgitation.  ------------------------------------------------------------------- Pulmonary artery:   The main pulmonary artery was normal-sized. Systolic pressure was mildly increased.  ------------------------------------------------------------------- Right atrium:  The atrium was normal in size.  ------------------------------------------------------------------- Pericardium:  There was no pericardial effusion.  ------------------------------------------------------------------- Systemic veins: Inferior vena cava: The vessel was normal in size.  ------------------------------------------------------------------- Measurements   Left ventricle                            Value          Reference  LV ID, ED, PLAX chordal           (L)     34.4  mm       43 - 52  LV ID, ES, PLAX chordal                   24.3  mm       23 - 38  LV fx shortening, PLAX chordal            29    %        >=29  LV PW thickness, ED                       14.4  mm       ---------  IVS/LV PW ratio,  ED  0.77           <=1.3  Stroke volume, 2D                         75    ml       ---------  Stroke volume/bsa, 2D                     44    ml/m^2   ---------  LV e&', lateral                            4.5   cm/s     ---------  LV E/e&', lateral                          35.11          ---------  LV e&', medial                             6.08  cm/s     ---------  LV E/e&', medial                           25.99          ---------  LV e&', average                            5.29  cm/s     ---------  LV E/e&', average                          29.87          ---------    Ventricular septum                        Value          Reference  IVS thickness, ED                         11.1  mm       ---------    LVOT                                      Value          Reference  LVOT ID, S                                21    mm       ---------  LVOT area                                 3.46  cm^2     ---------  LVOT peak velocity, S                     120   cm/s     ---------  LVOT mean velocity, S  71.8  cm/s     ---------  LVOT VTI, S                               21.7  cm       ---------  LVOT peak gradient, S                     6     mm Hg    ---------    Aorta                                     Value          Reference  Aortic root ID, ED                        30    mm       ---------    Left atrium                               Value          Reference  LA ID, A-P, ES                            40    mm       ---------  LA ID/bsa, A-P                    (H)     2.36  cm/m^2   <=2.2  LA volume, S                              143   ml       ---------  LA volume/bsa, S                          84.2  ml/m^2   ---------  LA volume, ES, 1-p A4C                    136   ml       ---------  LA volume/bsa, ES, 1-p A4C                80.1  ml/m^2   ---------  LA volume, ES, 1-p A2C                    141   ml       ---------  LA volume/bsa, ES, 1-p  A2C                83    ml/m^2   ---------    Mitral valve                              Value          Reference  Mitral E-wave peak velocity               158   cm/s     ---------  Mitral A-wave peak velocity  110   cm/s     ---------  Mitral mean velocity, D                   94.6  cm/s     ---------  Mitral deceleration time          (H)     289   ms       150 - 230  Mitral pressure half-time                 96    ms       ---------  Mitral mean gradient, D                   4     mm Hg    ---------  Mitral peak gradient, D                   10    mm Hg    ---------  Mitral E/A ratio, peak                    1.4            ---------  Mitral valve area, PHT, DP                2.29  cm^2     ---------  Mitral valve area/bsa, PHT, DP            1.35  cm^2/m^2 ---------  Mitral valve area, LVOT                   1.88  cm^2     ---------  continuity  Mitral valve area/bsa, LVOT               1.11  cm^2/m^2 ---------  continuity  Mitral annulus VTI, D                     39.9  cm       ---------  Mitral regurg VTI, PISA                   187   cm       ---------  Mitral ERO, PISA                          0.55  cm^2     ---------  Mitral regurg volume, PISA                103   ml       ---------    Pulmonary arteries                        Value          Reference  PA pressure, S, DP                (H)     37    mm Hg    <=30    Tricuspid valve                           Value          Reference  Tricuspid regurg peak velocity            269   cm/s     ---------  Tricuspid peak RV-RA gradient  29    mm Hg    ---------    Systemic veins                            Value          Reference  Estimated CVP                             8     mm Hg    ---------    Right ventricle                           Value          Reference  RV pressure, S, DP                (H)     37    mm Hg    <=30  RV s&', lateral, S                         13.2  cm/s     ---------     Pulmonic valve                            Value          Reference  Pulmonic regurg velocity, ED              121   cm/s     ---------  Pulmonic regurg gradient, ED              6     mm Hg    ---------  Legend: (L)  and  (H)  mark values outside specified reference range.  ------------------------------------------------------------------- Prepared and Electronically Authenticated by  Ena Dawley, M.D. 2017-09-27T08:47:01   Transesophageal Echocardiography  Patient:    Joy Williams, Joy Williams MR #:       [id number] Study Date: 02/22/2016 Gender:     F Age:        65 Height:     170.2 cm Weight:     61.2 kg BSA:        1.7 m^2 Pt. Status: Room:   ADMITTING    Ena Dawley, M.D.  ATTENDING    Ena Dawley, M.D.  ORDERING     Ena Dawley, M.D.  PERFORMING   Ena Dawley, M.D.  REFERRING    Ena Dawley, M.D.  SONOGRAPHER  Roseanna Rainbow  cc:  ------------------------------------------------------------------- LV EF: 60% -   65%  ------------------------------------------------------------------- Indications:      Mitral valve prolapse 424.0.  ------------------------------------------------------------------- History:   PMH:  Scleroderma. Severe mitral regurgitation.  Risk factors:  Current tobacco use.  ------------------------------------------------------------------- Study Conclusions  - Left ventricle: There was concentric hypertrophy. Systolic   function was normal. The estimated ejection fraction was in the   range of 60% to 65%. Wall motion was normal; there were no   regional wall motion abnormalities. - Mitral valve: Severely thickened myxomatous mitral valve   involving both of the leaflet with severe prolapse and severe   mitral regurgitation that is hitting the posterior portion of the   left atrium and swirling back with clear systolic reversible flow   in pulmonary veins forward flow. PISA is 1.2 and regurgitant   volume 93 mL.  There was severe regurgitation. - Left  atrium: The atrium was severely dilated. No evidence of   thrombus in the atrial cavity or appendage. No evidence of   thrombus in the atrial cavity or appendage. - Right atrium: The atrium was dilated. No evidence of thrombus in   the atrial cavity or appendage. - Atrial septum: No defect or patent foramen ovale was identified. - Tricuspid valve: There is also myxomatous degeneration and   prolapse of the posterior tricuspid valve leaflet associated with   multiple small jets collectively causing moderate regurgitation.   There was moderate regurgitation. - Pulmonary arteries: Systolic pressure was moderately increased.   PA peak pressure: 46 mm Hg (S).  Impressions:  - Myxomatous changes associated with severe prolapse and severe   mitral regurgitation. Also myxomatous changes in the tricuspid   valve associated with moderate tricuspid regurgitation. There is   moderate pulmonary hypertension.   This is most probably secondary to inherited connective tissue   disorder.  ------------------------------------------------------------------- Study data:   Study status:  Routine.  Consent:  The risks, benefits, and alternatives to the procedure were explained to the patient and informed consent was obtained.  Procedure:  Initial setup. The patient was brought to the laboratory. Surface ECG leads were monitored. Sedation. Conscious sedation was administered by cardiology staff. Transesophageal echocardiography. An adult multiplane transesophageal probe was inserted by the attending cardiologistwithout difficulty. Image quality was adequate.  Study completion:  The patient tolerated the procedure well. There were no complications.  Administered medications:   Fentanyl, 61mg, IV.  Midazolam, '4mg'$ , IV.          Diagnostic transesophageal echocardiography.  2D and color Doppler.  Birthdate:  Patient birthdate: 0Mar 13, 1952  Age:  Patient is 65yr  old.  Sex:  Gender: female.    BMI: 21.1 kg/m^2.  Blood pressure:     102/45  Patient status:  Inpatient.  Study date:  Study date: 02/22/2016. Study time: 12:54 PM.  Location:  Endoscopy.  -------------------------------------------------------------------  ------------------------------------------------------------------- Left ventricle:  There was concentric hypertrophy. Systolic function was normal. The estimated ejection fraction was in the range of 60% to 65%. Wall motion was normal; there were no regional wall motion abnormalities.  ------------------------------------------------------------------- Aortic valve:   Structurally normal valve. Trileaflet; normal thickness leaflets. Cusp separation was normal.  Doppler:  There was no significant regurgitation.  ------------------------------------------------------------------- Aorta:  There was no atheroma. There was no evidence for dissection. Aortic root: The aortic root was not dilated. Ascending aorta: The ascending aorta was normal in size. Descending aorta: The descending aorta was normal in size. There was mild non-mobie plaque.  ------------------------------------------------------------------- Mitral valve:  Severely thickened myxomatous mitral valve involving both of the leaflet with severe prolapse and severe mitral regurgitation that is hitting the posterior portion of the left atrium and swirling back with clear systolic reversible flow in pulmonary veins forward flow. PISA is 1.2 and regurgitant volume 93 mL.  Structurally normal valve.   Leaflet separation was normal. Doppler:  There was severe regurgitation.  ------------------------------------------------------------------- Left atrium:  The atrium was severely dilated.  No evidence of thrombus in the atrial cavity or appendage.  No evidence of thrombus in the atrial cavity or appendage. The appendage was morphologically a left appendage,  multilobulated, and of normal size. Emptying velocity was normal.  ------------------------------------------------------------------- Atrial septum:  No defect or patent foramen ovale was identified.   ------------------------------------------------------------------- Right ventricle:  The cavity size was normal. Wall thickness was normal. Systolic function was normal.  ------------------------------------------------------------------- Pulmonic valve:    Structurally  normal valve.    Doppler:  There was mild regurgitation.  ------------------------------------------------------------------- Tricuspid valve:  There is also myxomatous degeneration and prolapse of the posterior tricuspid valve leaflet associated with multiple small jets collectively causing moderate regurgitation. Structurally normal valve.   Leaflet separation was normal. Doppler:  There was moderate regurgitation.  ------------------------------------------------------------------- Pulmonary artery:   The main pulmonary artery was mildly dilated. Systolic pressure was moderately increased.  ------------------------------------------------------------------- Right atrium:  The atrium was dilated.  No evidence of thrombus in the atrial cavity or appendage. The appendage was morphologically a right appendage.  ------------------------------------------------------------------- Pericardium:  The pericardium was normal in appearance. There was no pericardial effusion.  ------------------------------------------------------------------- Measurements   Mitral valve                          Value       Reference  Mitral regurg VTI, PISA               211   cm    ---------  Mitral ERO, PISA                      0.44  cm^2  ---------  Mitral regurg volume, PISA            93    ml    ---------    Pulmonary arteries                    Value       Reference  PA pressure, S, DP             (H)    46    mm Hg  <=30    Tricuspid valve                       Value       Reference  Tricuspid regurg peak velocity        327   cm/s  ---------  Tricuspid peak RV-RA gradient         43    mm Hg ---------  Legend: (L)  and  (H)  mark values outside specified reference range.  ------------------------------------------------------------------- Prepared and Electronically Authenticated by  Ena Dawley, M.D. 2017-10-05T16:42:29   Impression:  Patient has stage D severe symptomatic primary mitral regurgitation. I personally reviewed her recent transthoracic and transesophageal echocardiograms. 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 There is severe thickening with excessive leaflet tissue involving both the anterior and the posterior leaflets. There is elongation of most if not all of the primary chordae tendineae to both leaflets. The jet of regurgitation is central and crosses the entire left atrium. There do not appear to be any ruptured chordae tendineae or flail segments.  Using PISA the ERO was measured 0.44 centimeters squared corresponding to a regurgitant volume estimated 93 mL.  There was flow reversal in the pulmonary veins. The left atrium is severely dilated. Left ventricular size and systolic function appears normal. There is some myxomatous degenerative prolapse involving the tricuspid valve with mild to moderate tricuspid regurgitation.  I agree the patient would probably best be treated with elective mitral valve repair. Based upon review of the transesophageal echocardiogram, I feel there remained a significant likelihood that her valve may be repairable, although the likelihood of successful and durable repair is probably somewhat less than 95% due to the extensive thickening and fibrosis  with severe bileaflet prolapse. There may be significant calcification as well. The patient's symptoms of exertional shortness of breath  are probably multifactorial but certainly consistent with the presence of chronic diastolic congestive heart failure, [place] Heart Association functional class II. I suspect that at least some degree of the patient's symptoms are related to her underlying chronic lung disease with history of previous wedge resection 2. Finally, the patient appears to be doing well with respect to her history of lung cancer that has been surgically resected, but her long-term prognosis is probably somewhat muted under the circumstances.   Plan:  The patient was counseled at length regarding the indications, risks and potential benefits of mitral valve repair.  Several family members listened in to her consultation over the telephone. The rationale for elective surgery has been explained, including a comparison between surgery and continued medical therapy with close follow-up.  The likelihood of successful and durable valve repair has been discussed with particular reference to the findings of their recent echocardiogram.  Based upon these findings and previous experience, I have quoted them a greater than 50 percent likelihood of successful valve repair.  In the unlikely event that their valve cannot be successfully repaired, we discussed the possibility of replacing the mitral valve using a mechanical prosthesis with the attendant need for long-term anticoagulation versus the alternative of replacing it using a bioprosthetic tissue valve with its potential for late structural valve deterioration and failure, depending upon the patient's longevity.  Alternative surgical approaches have been discussed including a comparison between conventional median sternotomy and minimally invasive techniques. All of the patient's questions have been addressed. She is interested in proceeding with further diagnostic testing to consider elective surgery in the near future.  As a next step the patient will need to undergo left and right  heart catheterization. We will obtain formal pulmonary function tests. Finally, we will obtain a nuclear medicine PET/CT scan for follow-up of her lung cancer. Depending upon the results of these tests we will consider alternative surgical approaches, and if the patient is interested in a minimally invasive approach we will obtain CT angiogram of the aorta and iliac vessels. The patient will return to our office in 2-3 weeks to review the results of these tests and discuss treatment options further.  The patient has been strongly advised that she must stop smoking completely. She needs to stop smoking before we perform surgery, and if she can't stop smoking permanently her long-term outcome will likely be adversely affected.  I spent in excess of 90 minutes during the conduct of this office consultation and >50% of this time involved direct face-to-face encounter with the patient for counseling and/or coordination of their care.   Valentina Gu. Roxy Manns, MD 02/29/2016 6:36 PM

## 2016-03-08 NOTE — Discharge Instructions (Signed)
Radial Site Care °Refer to this sheet in the next few weeks. These instructions provide you with information about caring for yourself after your procedure. Your health care provider may also give you more specific instructions. Your treatment has been planned according to current medical practices, but problems sometimes occur. Call your health care provider if you have any problems or questions after your procedure. °WHAT TO EXPECT AFTER THE PROCEDURE °After your procedure, it is typical to have the following: °· Bruising at the radial site that usually fades within 1-2 weeks. °· Blood collecting in the tissue (hematoma) that may be painful to the touch. It should usually decrease in size and tenderness within 1-2 weeks. °HOME CARE INSTRUCTIONS °· Take medicines only as directed by your health care provider. °· You may shower 24-48 hours after the procedure or as directed by your health care provider. Remove the bandage (dressing) and gently wash the site with plain soap and water. Pat the area dry with a clean towel. Do not rub the site, because this may cause bleeding. °· Do not take baths, swim, or use a hot tub until your health care provider approves. °· Check your insertion site every day for redness, swelling, or drainage. °· Do not apply powder or lotion to the site. °· Do not flex or bend the affected arm for 24 hours or as directed by your health care provider. °· Do not push or pull heavy objects with the affected arm for 24 hours or as directed by your health care provider. °· Do not lift over 10 lb (4.5 kg) for 5 days after your procedure or as directed by your health care provider. °· Ask your health care provider when it is okay to: °¨ Return to work or school. °¨ Resume usual physical activities or sports. °¨ Resume sexual activity. °· Do not drive home if you are discharged the same day as the procedure. Have someone else drive you. °· You may drive 24 hours after the procedure unless otherwise  instructed by your health care provider. °· Do not operate machinery or power tools for 24 hours after the procedure. °· If your procedure was done as an outpatient procedure, which means that you went home the same day as your procedure, a responsible adult should be with you for the first 24 hours after you arrive home. °· Keep all follow-up visits as directed by your health care provider. This is important. °SEEK MEDICAL CARE IF: °· You have a fever. °· You have chills. °· You have increased bleeding from the radial site. Hold pressure on the site. °SEEK IMMEDIATE MEDICAL CARE IF: °· You have unusual pain at the radial site. °· You have redness, warmth, or swelling at the radial site. °· You have drainage (other than a small amount of blood on the dressing) from the radial site. °· The radial site is bleeding, and the bleeding does not stop after 30 minutes of holding steady pressure on the site. °· Your arm or hand becomes pale, cool, tingly, or numb. °  °This information is not intended to replace advice given to you by your health care provider. Make sure you discuss any questions you have with your health care provider. °  °Document Released: 06/08/2010 Document Revised: 05/27/2014 Document Reviewed: 11/22/2013 °[employer]vier Interactive Patient Education ©2016 [employer] ° °

## 2016-03-08 NOTE — Interval H&P Note (Signed)
History and Physical Interval Note:  03/08/2016 10:11 AM  Joy Williams  has presented today for surgery, with the diagnosis of mr  The various methods of treatment have been discussed with the patient and family. After consideration of risks, benefits and other options for treatment, the patient has consented to  Procedure(s): Right/Left Heart Cath and Coronary Angiography (N/A) as a surgical intervention .  The patient's history has been reviewed, patient examined, no change in status, stable for surgery.  I have reviewed the patient's chart and labs.  Questions were answered to the patient's satisfaction.     Sherren Mocha

## 2016-03-11 ENCOUNTER — Encounter (HOSPITAL_COMMUNITY): Payer: Self-pay | Admitting: Cardiovascular Disease

## 2016-03-12 ENCOUNTER — Ambulatory Visit (HOSPITAL_COMMUNITY)
Admission: RE | Admit: 2016-03-12 | Discharge: 2016-03-12 | Disposition: A | Discharge status: Home / Self Care | Payer: Medicare Other | Source: Ambulatory Visit | Attending: Thoracic Surgery (Cardiothoracic Vascular Surgery) | Admitting: Thoracic Surgery (Cardiothoracic Vascular Surgery)

## 2016-03-12 DIAGNOSIS — Z85118 Personal history of other malignant neoplasm of bronchus and lung: Secondary | ICD-10-CM | POA: Insufficient documentation

## 2016-03-12 DIAGNOSIS — K5730 Diverticulosis of large intestine without perforation or abscess without bleeding: Secondary | ICD-10-CM | POA: Insufficient documentation

## 2016-03-12 DIAGNOSIS — I340 Nonrheumatic mitral (valve) insufficiency: Secondary | ICD-10-CM | POA: Insufficient documentation

## 2016-03-12 DIAGNOSIS — R918 Other nonspecific abnormal finding of lung field: Secondary | ICD-10-CM | POA: Diagnosis not present

## 2016-03-12 DIAGNOSIS — Z79899 Other long term (current) drug therapy: Secondary | ICD-10-CM | POA: Insufficient documentation

## 2016-03-12 DIAGNOSIS — C3490 Malignant neoplasm of unspecified part of unspecified bronchus or lung: Secondary | ICD-10-CM | POA: Diagnosis not present

## 2016-03-12 DIAGNOSIS — I517 Cardiomegaly: Secondary | ICD-10-CM | POA: Insufficient documentation

## 2016-03-12 DIAGNOSIS — I700 Atherosclerosis of aorta: Secondary | ICD-10-CM | POA: Insufficient documentation

## 2016-03-12 DIAGNOSIS — R935 Abnormal findings on diagnostic imaging of other abdominal regions, including retroperitoneum: Secondary | ICD-10-CM | POA: Insufficient documentation

## 2016-03-12 LAB — PULMONARY FUNCTION TEST
DL/VA % PRED: 84 %
DL/VA: 4.28 ml/min/mmHg/L
DLCO COR: 14.73 ml/min/mmHg
DLCO cor % pred: 53 %
DLCO unc % pred: 48 %
DLCO unc: 13.52 ml/min/mmHg
FEF 25-75 PRE: 1.93 L/s
FEF 25-75 Post: 1.94 L/sec
FEF2575-%Change-Post: 0 %
FEF2575-%PRED-PRE: 85 %
FEF2575-%Pred-Post: 85 %
FEV1-%Change-Post: 0 %
FEV1-%PRED-PRE: 78 %
FEV1-%Pred-Post: 78 %
FEV1-POST: 2.09 L
FEV1-PRE: 2.09 L
FEV1FVC-%Change-Post: 2 %
FEV1FVC-%Pred-Pre: 101 %
FEV6-%CHANGE-POST: -2 %
FEV6-%PRED-PRE: 79 %
FEV6-%Pred-Post: 77 %
FEV6-POST: 2.59 L
FEV6-PRE: 2.66 L
FEV6FVC-%Change-Post: 0 %
FEV6FVC-%PRED-POST: 103 %
FEV6FVC-%PRED-PRE: 103 %
FVC-%Change-Post: -2 %
FVC-%PRED-POST: 75 %
FVC-%PRED-PRE: 76 %
FVC-POST: 2.61 L
FVC-Pre: 2.67 L
POST FEV6/FVC RATIO: 100 %
PRE FEV1/FVC RATIO: 78 %
Post FEV1/FVC ratio: 80 %
Pre FEV6/FVC Ratio: 100 %
RV % PRED: 71 %
RV: 1.58 L
TLC % PRED: 77 %
TLC: 4.18 L

## 2016-03-12 LAB — GLUCOSE, CAPILLARY: Glucose-Capillary: 91 mg/dL (ref 65–99)

## 2016-03-12 MED ORDER — ALBUTEROL SULFATE (2.5 MG/3ML) 0.083% IN NEBU
2.5000 mg | INHALATION_SOLUTION | Freq: Once | RESPIRATORY_TRACT | Status: AC
  Administered 2016-03-12: 2.5 mg via RESPIRATORY_TRACT

## 2016-03-12 MED ORDER — FLUDEOXYGLUCOSE F - 18 (FDG) INJECTION
6.6800 | Freq: Once | INTRAVENOUS | Status: AC | PRN
  Administered 2016-03-12: 6.68 via INTRAVENOUS

## 2016-03-25 ENCOUNTER — Ambulatory Visit (INDEPENDENT_AMBULATORY_CARE_PROVIDER_SITE_OTHER): Payer: Medicare Other | Admitting: Thoracic Surgery (Cardiothoracic Vascular Surgery)

## 2016-03-25 ENCOUNTER — Other Ambulatory Visit: Payer: Self-pay | Admitting: *Deleted

## 2016-03-25 ENCOUNTER — Encounter: Payer: Self-pay | Admitting: Thoracic Surgery (Cardiothoracic Vascular Surgery)

## 2016-03-25 VITALS — BP 94/54 | HR 72 | Resp 20 | Ht 67.0 in | Wt 135.0 lb

## 2016-03-25 DIAGNOSIS — I369 Nonrheumatic tricuspid valve disorder, unspecified: Secondary | ICD-10-CM | POA: Diagnosis not present

## 2016-03-25 DIAGNOSIS — I7409 Other arterial embolism and thrombosis of abdominal aorta: Secondary | ICD-10-CM

## 2016-03-25 DIAGNOSIS — Z01818 Encounter for other preprocedural examination: Secondary | ICD-10-CM

## 2016-03-25 DIAGNOSIS — Q228 Other congenital malformations of tricuspid valve: Secondary | ICD-10-CM

## 2016-03-25 DIAGNOSIS — I341 Nonrheumatic mitral (valve) prolapse: Secondary | ICD-10-CM

## 2016-03-25 DIAGNOSIS — M349 Systemic sclerosis, unspecified: Secondary | ICD-10-CM

## 2016-03-25 DIAGNOSIS — C3492 Malignant neoplasm of unspecified part of left bronchus or lung: Secondary | ICD-10-CM

## 2016-03-25 DIAGNOSIS — I712 Thoracic aortic aneurysm, without rupture, unspecified: Secondary | ICD-10-CM

## 2016-03-25 DIAGNOSIS — I340 Nonrheumatic mitral (valve) insufficiency: Secondary | ICD-10-CM | POA: Diagnosis not present

## 2016-03-25 DIAGNOSIS — D381 Neoplasm of uncertain behavior of trachea, bronchus and lung: Secondary | ICD-10-CM

## 2016-03-25 NOTE — Patient Instructions (Signed)
Continue all previous medications without any changes at this time  Stop smoking

## 2016-03-25 NOTE — Progress Notes (Signed)
[address]       [place] [address]             [phone]     CARDIOTHORACIC SURGERY OFFICE NOTE  Referring Provider is Dorothy Spark, MD PCP is Velna Hatchet, MD   HPI:  Patient returns to the office today for follow-up of stage D severe symptomatic primary mitral regurgitation. She was initially seen in consultation on 02/29/2016. Since then she underwent PET CT scan, diagnostic cardiac catheterization, and pulmonary function testing. She returns to the office today with her daughter present to discuss the results of these tests. She reports that over the last few days she has developed an upper respiratory tract infection manifest as nasal congestion and cough. She denies any significant fevers or chills. Cough is not productive. Her breathing is otherwise stable and unchanged from previously. She continues to smoke cigarettes.   Current Outpatient Prescriptions  Medication Sig Dispense Refill  . aspirin EC 81 MG tablet Take 81 mg by mouth daily.    . DULERA 100-5 MCG/ACT AERO USE 2 PUFFS FIRST THING IN THE MORNING AND 2 PUFFS 12 HOURS LATER 3 Inhaler 0  . levothyroxine (SYNTHROID, LEVOTHROID) 175 MCG tablet Take 175 mcg by mouth daily before breakfast.    . Multiple Vitamins-Minerals (MULTI VITAMIN/MINERALS PO) Take 1 tablet by mouth daily.     . Multiple Vitamins-Minerals (ZINC PO) Take 1 tablet by mouth daily.    . naproxen sodium (ANAPROX) 220 MG tablet Take 220 mg by mouth 2 (two) times daily as needed (pain).    . Omega-3 Fatty Acids (FISH OIL PO) Take 1 capsule by mouth daily.      No current facility-administered medications for this visit.       Physical Exam:   BP (!) 94/54 (BP Location: Right Arm, Patient Position: Sitting, Cuff Size: Small)   Pulse 72   Resp 20   Ht '5\' 7"'$  (1.702 m)   Wt 135 lb (61.2 kg)   SpO2 99%   BMI 21.14 kg/m   General:  Appears somewhat older than stated age in no distress  Neck:   No palpable  adenopathy  Chest:   Few scattered rhonchi with symmetrical breath sounds  CV:   Regular rate and rhythm with systolic murmur  Incisions:  n/a  Abdomen:  Soft nontender  Extremities:  Warm and well-perfused  Diagnostic Tests:  NUCLEAR MEDICINE PET SKULL BASE TO THIGH  TECHNIQUE: 6.7 mCi F-18 FDG was injected intravenously. Full-ring PET imaging was performed from the skull base to thigh after the radiotracer. CT data was obtained and used for attenuation correction and anatomic localization.  FASTING BLOOD GLUCOSE:  Value: Ninety-one mg/dl  COMPARISON:  None.  FINDINGS: NECK  No hypermetabolic lymph nodes in the neck. Symmetric tongue base and palate activity thought to be physiologic. Activity in the anterior tongue likewise thought to be physiologic. Focally high activity along the right side of the pharyngo esophageal junction and posterior to the glottis without CT correlate, maximum standard uptake value 6.9, no previous hypermetabolic activity in this vicinity.  CHEST  Interval resections of the left upper lobe and lower lobe masses with wedge resections staple lines, there is some indistinct residual airspace opacity in the left lower lobe with maximum standard uptake value about 3.6, but without a discrete focal lesion identified in this vicinity. In the left infrahilar region below the mainstem bronchus, and corresponding to a 7 mm in short axis  lymph node, there is faintly hypermetabolic activity with maximum SUV 4.1, new from the prior exam in the left AP window there is faint hypermetabolic activity with maximum SUV 3.7; background mediastinal blood pool activity is 2.7. A right lower paratracheal node anterior to the carina measuring about 9 mm in short axis has a maximum standard uptake value of 3.3, borderline high. Right infrahilar lymph node with maximum SUV 3.7.  Anteriorly in the right middle lobe, a 4 mm subpleural nodule on image 52 series  8 does not demonstrate hypermetabolic activity but is below sensitive PET-CT size thresholds. Also on the right middle lobe there is a stable 4 mm nodule on image 42/8, without high metabolic activity. Both unchanged from 11/16/2014.  Mild cardiomegaly. Dense mitral annular calcification. Atherosclerotic calcification of the aortic arch.  ABDOMEN/PELVIS  No abnormal hypermetabolic activity within the liver, pancreas, adrenal glands, or spleen. No hypermetabolic lymph nodes in the abdomen or pelvis.  Aortoiliac atherosclerotic vascular disease. Sigmoid colon diverticulosis. Likely physiologic activity of the anal sphincter. Faint rim calcification along a cystic lesion in the left lower quadrant, this lesion is photopenic on PET imaging.  SKELETON  No focal hypermetabolic activity to suggest skeletal metastasis. Left clavicular plate and screw fixator.  IMPRESSION: 1. Interval wedge resections in the left upper lobe and left lower lobe with removal of the prior masses. 2. Faintly accentuated metabolic activity in multiple bilateral lymph nodes in the chest. These could conceivably be reactive rather than malignant given the low-grade activity. 3. Several small right-sided lung nodules are unchanged from 11/16/2014, and below sensitive PET-CT size thresholds. Surveillance may be warranted. 4. New focal high-grade activity along the right side of the pharyngo esophageal junction without a definite CT correlate, probably physiologic, maximum SUV 6.9, area merits surveillance. This did as at the level of the glottis but appears to be posterior to the glottis on the right. 5. Other imaging findings of potential clinical significance: Indistinct airspace opacity in the left lower lobe with low-grade activity, likely inflammatory. Pneumonia not excluded. Mild cardiomegaly. Dense mitral annular calcification. Atherosclerotic abdominal aorta and aortic arch. Sigmoid colon  diverticulosis. 6. Rim calcified left lower quadrant cystic lesion is photopenic and likely benign.   Electronically Signed   By: Van Clines M.D.   On: 03/12/2016 11:49   Right/Left Heart Cath and Coronary Angiography  Conclusion   1. Angiographically normal coronary arteries 2. Normal intracardiac pressures in both the right and left heart chambers with preserved cardiac output 3. Severely calcified mitral annulus with known bileaflet mitral valve prolapse and severe mitral regurgitation  Indications   Severe mitral regurgitation [I34.0 (ICD-10-CM)]  Procedural Details/Technique   Technical Details INDICATION: Severe mitral regurgitation (Barlow's). Preoperative cardiac surgical study.  PROCEDURAL DETAILS: There was an indwelling IV in a right antecubital vein. Using normal sterile technique, the IV was changed out for a 5 Fr brachial sheath over a 0.018 inch wire. The right wrist was then prepped, draped, and anesthetized with 1% lidocaine. Using the modified Seldinger technique a 5/6 French Slender sheath was placed in the right radial artery. Intra-arterial verapamil was administered through the radial artery sheath. IV heparin was administered after a JR4 catheter was advanced into the central aorta. A Swan-Ganz catheter was used for the right heart catheterization. I was not able to advance the Swan-Ganz catheter into the pulmonary artery because of pain in the patient's arm limiting catheter manipulation. I changed out the Swan-Ganz catheter for a 5 [place] purpose and was able  to record pulmonary artery and pulmonary capillary wedge pressures with this. Standard protocol was followed for recording of right heart pressures and sampling of oxygen saturations. Fick cardiac output was calculated. Standard Judkins catheters were used for selective coronary angiography. The JR4 catheter was used to record left ventricular pressure. There was an attempt made to steer the [place] catheter  with the wire into the descending thoracic aorta for an abdominal aortogram but this was unsuccessful because of the angle of the innominate artery into the aortic arch. There were no immediate procedural complications. The patient was transferred to the post catheterization recovery area for further monitoring.     Estimated blood loss <50 mL.  During this procedure the patient was administered the following to achieve and maintain moderate conscious sedation: Versed 2 mg, Fentanyl 50 mcg, while the patient's heart rate, blood pressure, and oxygen saturation were continuously monitored. The period of conscious sedation was 39 minutes, of which I was present face-to-face 100% of this time.    Coronary Findings   Dominance: Right  Left Anterior Descending  Vessel is angiographically normal. The LAD is a large vessel with no obstruction. The vessel wraps around the LV apex. The first diagonal is a large vessel with no disease noted. Vessel is angiographically normal.  Left Circumflex  Vessel is angiographically normal. The circumflex is widely patent and smooth throughout. There is no obstructive disease. The first OM is tiny. The second OM supplies twin branches to the posterolateral wall.  Right Coronary Artery  Vessel is angiographically normal. Large, dominant vessel. The vessel is smooth throughout with no evidence of atherosclerotic disease. The PDA and PLA branches are patent.  Right Heart   Right Heart Pressures LV EDP is normal.    Left Heart   Mitral Valve The annulus is calcified.    Coronary Diagrams   Diagnostic Diagram     Implants     No implant documentation for this case.  PACS Images   Show images for Cardiac catheterization   Link to Procedure Log   Procedure Log    Hemo Data   Flowsheet Row Most Recent Value  Fick Cardiac Output 5.07 L/min  Fick Cardiac Output Index 2.96 (L/min)/BSA  RA A Wave 3 mmHg  RA V Wave 1 mmHg  RA Mean 0 mmHg  RV Systolic  Pressure 23 mmHg  RV Diastolic Pressure 0 mmHg  RV EDP 2 mmHg  PA Systolic Pressure 28 mmHg  PA Diastolic Pressure 7 mmHg  PA Mean 17 mmHg  PW A Wave 10 mmHg  PW V Wave 10 mmHg  PW Mean 8 mmHg  AO Systolic Pressure 416 mmHg  AO Diastolic Pressure 57 mmHg  AO Mean 76 mmHg  LV Systolic Pressure 606 mmHg  LV Diastolic Pressure 0 mmHg  LV EDP 9 mmHg  Arterial Occlusion Pressure Extended Systolic Pressure 301 mmHg  Arterial Occlusion Pressure Extended Diastolic Pressure 55 mmHg  Arterial Occlusion Pressure Extended Mean Pressure 78 mmHg  Left Ventricular Apex Extended Systolic Pressure 601 mmHg  Left Ventricular Apex Extended Diastolic Pressure 0 mmHg  Left Ventricular Apex Extended EDP Pressure 11 mmHg  QP/QS 1  TPVR Index 5.74 HRUI  TSVR Index 25.65 HRUI  TPVR/TSVR Ratio 0.22     Pulmonary Function Tests  Baseline      Post-bronchodilator  FVC  2.67 L  (76% predicted) FVC  2.61 L  (75% predicted) FEV1  2.09 L  (78% predicted) FEV1  2.09 L  (78% predicted) FEF25-75 1.93  L  (85% predicted) FEF25-75 1.94 L  (85% predicted)  TLC  4.18 L  (77% predicted) RV  1.58 L  (71% predicted) DLCO  53% predicted     Impression:  Patient has stage D severe symptomatic primary mitral regurgitation. I personally reviewed her recent transthoracic and transesophageal echocardiograms. 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 There is severe thickening with excessive leaflet tissue involving both the anterior and the posterior leaflets. There is elongation of most if not all of the primary chordae tendineae to both leaflets. The jet of regurgitation is central and crosses the entire left atrium. There do not appear to be any ruptured chordae tendineae or flail segments.  Using PISA the ERO was measured 0.44 centimeters squared corresponding to a regurgitant volume estimated 93 mL.  There was flow reversal in the pulmonary veins. The  left atrium is severely dilated. Left ventricular size and systolic function appears normal. There is some myxomatous degenerative prolapse involving the tricuspid valve with mild to moderate tricuspid regurgitation.  I agree the patient would probably best be treated with elective mitral valve repair. Based upon review of the transesophageal echocardiogram, I feel there remained a significant likelihood that her valve may be repairable, although the likelihood of successful and durable repair is probably somewhat less than 95% due to the extensive thickening and fibrosis with severe bileaflet prolapse. There appears to be significant calcification as well. Diagnostic cardiac catheterization is notable for the absence of significant coronary artery disease and normal pulmonary artery pressures. The patient's symptoms of exertional shortness of breath are probably multifactorial but certainly consistent with the presence of chronic diastolic congestive heart failure, [place] Heart Association functional class II.  Follow-up pulmonary function tests revealed only mild obstruction with slight decrease in FVC which would be expected following the patient's previous lung surgery. The patient's underlying lung disease is certainly not prohibitive, and she might be an acceptable candidate for minimally invasive approach for surgery. Most importantly, follow-up PET CT scan reveals no sign of significant recurrence or progression of the patient's underlying lung cancer, although there was somewhat atypical area of increased metabolic uptake noted in the right neck that will need follow-up.  The patient does not have any palpable lymphadenopathy on exam. Unfortunately, the patient continues to smoke cigarettes.  In addition, if present she describes symptoms consistent with a mild upper respiratory tract infection.   Plan:  I reviewed the results of the patient's recent PET/CT scan, cardiac catheterization, and  pulmonary function test with the patient and her daughter. Implications regarding these findings were discussed. We again discussed the indications, risks, and potential benefits of elective mitral valve repair. Alternative surgical approaches of been discussed. The patient is interested in proceeding with surgery at some point in the not too distant future, and she is particularly interested in possible minimally invasive approach. We will obtain CT angiogram of the aorta and iliac vessels to evaluate the feasibility of peripheral cannulation for surgery. The patient has been instructed that she must stop smoking completely for at least 4 weeks. She will return next month to see how she has done with smoking cessation and potentially schedule surgery at that time. All of her questions have been addressed.  I spent in excess of 15 minutes during the conduct of this office consultation and >50% of this time involved direct face-to-face encounter with the patient for counseling and/or coordination of their care.    Valentina Gu. Roxy Manns,  MD 03/25/2016 1:27 PM

## 2016-03-29 ENCOUNTER — Ambulatory Visit
Admission: RE | Admit: 2016-03-29 | Discharge: 2016-03-29 | Disposition: A | Discharge status: Home / Self Care | Payer: Medicare Other | Source: Ambulatory Visit | Attending: Thoracic Surgery (Cardiothoracic Vascular Surgery) | Admitting: Thoracic Surgery (Cardiothoracic Vascular Surgery)

## 2016-03-29 DIAGNOSIS — I712 Thoracic aortic aneurysm, without rupture, unspecified: Secondary | ICD-10-CM

## 2016-03-29 DIAGNOSIS — Z01818 Encounter for other preprocedural examination: Secondary | ICD-10-CM

## 2016-03-29 DIAGNOSIS — I7409 Other arterial embolism and thrombosis of abdominal aorta: Secondary | ICD-10-CM

## 2016-03-29 DIAGNOSIS — I701 Atherosclerosis of renal artery: Secondary | ICD-10-CM | POA: Diagnosis not present

## 2016-03-29 DIAGNOSIS — I700 Atherosclerosis of aorta: Secondary | ICD-10-CM | POA: Diagnosis not present

## 2016-04-22 ENCOUNTER — Ambulatory Visit (INDEPENDENT_AMBULATORY_CARE_PROVIDER_SITE_OTHER): Payer: Medicare Other | Admitting: Thoracic Surgery (Cardiothoracic Vascular Surgery)

## 2016-04-22 ENCOUNTER — Other Ambulatory Visit: Payer: Self-pay | Admitting: *Deleted

## 2016-04-22 ENCOUNTER — Encounter: Payer: Self-pay | Admitting: Thoracic Surgery (Cardiothoracic Vascular Surgery)

## 2016-04-22 VITALS — BP 102/64 | HR 70 | Resp 20 | Ht 67.0 in | Wt 135.0 lb

## 2016-04-22 DIAGNOSIS — Q228 Other congenital malformations of tricuspid valve: Secondary | ICD-10-CM

## 2016-04-22 DIAGNOSIS — F17210 Nicotine dependence, cigarettes, uncomplicated: Secondary | ICD-10-CM

## 2016-04-22 DIAGNOSIS — I341 Nonrheumatic mitral (valve) prolapse: Secondary | ICD-10-CM

## 2016-04-22 DIAGNOSIS — D381 Neoplasm of uncertain behavior of trachea, bronchus and lung: Secondary | ICD-10-CM | POA: Diagnosis not present

## 2016-04-22 DIAGNOSIS — I340 Nonrheumatic mitral (valve) insufficiency: Secondary | ICD-10-CM | POA: Diagnosis not present

## 2016-04-22 DIAGNOSIS — I369 Nonrheumatic tricuspid valve disorder, unspecified: Secondary | ICD-10-CM | POA: Diagnosis not present

## 2016-04-22 NOTE — Progress Notes (Signed)
[address]       [place] [address]             [phone]     CARDIOTHORACIC SURGERY OFFICE NOTE  Referring Provider is Dorothy Spark, MD PCP is Velna Hatchet, MD   HPI:  Patient returns to the office today for follow-up of stage D severe symptomatic primary mitral regurgitation. She was originally seen in consultation on 02/29/2016 and she was last seen here in our office on 03/25/2016. Since then she has successfully stopped smoking altogether. She states that she had her last cigarette several days before Thanksgiving. She returns to our office today to review the results of her recent CT angiogram and make tentative plans for elective surgery after the holidays. She reports no new problems or complaints. She describes stable symptoms of exertional shortness of breath consistent with chronic diastolic congestive heart failure, New York Heart Association functional class II.  She states that since she stopped smoking altogether she has had some increased mucus production associated with her chronic cough. She denies any fevers or chills. She has not had resting shortness of breath. The remainder of her review of systems is unremarkable.   Current Outpatient Prescriptions  Medication Sig Dispense Refill  . aspirin EC 81 MG tablet Take 81 mg by mouth daily.    . DULERA 100-5 MCG/ACT AERO USE 2 PUFFS FIRST THING IN THE MORNING AND 2 PUFFS 12 HOURS LATER 3 Inhaler 0  . levothyroxine (SYNTHROID, LEVOTHROID) 175 MCG tablet Take 175 mcg by mouth daily before breakfast.    . Multiple Vitamins-Minerals (MULTI VITAMIN/MINERALS PO) Take 1 tablet by mouth daily.     . Multiple Vitamins-Minerals (ZINC PO) Take 1 tablet by mouth daily.    . naproxen sodium (ANAPROX) 220 MG tablet Take 220 mg by mouth 2 (two) times daily as needed (pain).    . Omega-3 Fatty Acids (FISH OIL PO) Take 1 capsule by mouth daily.      No current facility-administered medications for this  visit.       Physical Exam:   BP 102/64   Pulse 70   Resp 20   Ht '5\' 7"'$  (1.702 m)   Wt 135 lb (61.2 kg)   SpO2 99% Comment: RA  BMI 21.14 kg/m   General:  Well-appearing  Chest:   Clear  CV:   Regular rate and rhythm with prominent holosystolic murmur  Incisions:  n/a  Abdomen:  Soft nontender  Extremities:  Warm and well-perfused  Diagnostic Tests:   CT ANGIOGRAPHY CHEST, ABDOMEN AND PELVIS  TECHNIQUE: Multidetector CT imaging through the chest, abdomen and pelvis was performed using the standard protocol during bolus administration of intravenous contrast. Multiplanar reconstructed images and MIPs were obtained and reviewed to evaluate the vascular anatomy.  CONTRAST:  75 mL of Isovue 370 intravenous contrast  COMPARISON:  Chest CT, 11/16/2014.  PET-CT, 03/12/2016  FINDINGS: CTA CHEST FINDINGS  Cardiovascular: Thoracic aorta is normal in caliber. Ascending aorta measures 3.2 cm in greatest diameter. Mild atherosclerotic, noncalcified plaque noted along the arch and descending thoracic aorta. No aortic dissection. Branch vessels are widely patent. Eccentric calcified plaque noted at the origin of the right subclavian artery.  Heart is normal in overall size. There is left atrial dilation. Dense calcification lies along the mitral valve annulus.  No coronary artery calcifications.  Mediastinum/Nodes: No neck base, axillary, mediastinal or hilar masses or pathologically enlarged lymph nodes.  Lungs/Pleura: Since the prior  chest CT, wedge resections have been performed removing the left upper and left lower lobe pulmonary nodules. There is associated postsurgical scarring. There are no new lung nodules. Small previously described lung nodules are stable. Peripheral reticular opacities consistent with interstitial fibrosis is unchanged. No evidence of pneumonia or pulmonary edema. No pleural effusion or pneumothorax.  Review of the MIP images  confirms the above findings.  CTA ABDOMEN AND PELVIS FINDINGS  VASCULAR  Aorta: Abdominal aorta is normal in caliber. No dissection. There is mild partly calcified atherosclerotic plaque throughout the abdominal aorta with no significant stenosis. This extends into the common, this extends into the common iliac arteries and internal iliac arteries. The external iliac arteries are widely patent with no significant plaque.  Celiac: Widely patent  SMA: Widely patent  Renals: Single bilateral renal arteries. There is significant calcified plaque at the origin of both renal arteries. Both renal arteries are narrowed at their origins, 50-60%.  IMA: Patent.  Inflow: External iliac and common femoral arteries are widely patent. No evidence of significant inflow obstruction to the lower extremities.  Veins: On the delayed images, the inferior vena cava is patent. No evidence of thrombus.  Review of the MIP images confirms the above findings.  NON-VASCULAR  Hepatobiliary: No focal liver abnormality is seen. No gallstones, gallbladder wall thickening, or biliary dilatation.  Pancreas: Unremarkable. No pancreatic ductal dilatation or surrounding inflammatory changes.  Spleen: Normal in size without focal abnormality.  Adrenals/Urinary Tract: No adrenal masses. Tiny low-density lesion in the posterior midpole the left kidney, too small to characterize, likely a cyst. No other renal masses or lesions. No stones. No hydronephrosis. Normal ureters. Normal bladder.  Stomach/Bowel: Stomach and small bowel are unremarkable. Mild increased stool noted throughout the colon. There multiple sigmoid colon diverticula. No diverticulitis or other colon inflammation. Normal appendix visualized.  Lymphatic: No enlarged lymph nodes.  Reproductive: Uterus is unremarkable. There is an anterior left lower pelvis/adnexal cystic mass, oval and circumscribed, measuring 4 x 3.2  x 3.8 cm, stable from the prior PET-CT. No other adnexal abnormality.  Other: No abdominal wall hernia.  No ascites.  Review of the MIP images confirms the above findings.  MUSCULOSKELETAL FINDINGS  Skeletal structures are demineralized. There are degenerative changes of the lumbar spine. No osteoblastic or osteolytic lesions.  IMPRESSION: 1. No thoracic aortic aneurysm. Ascending aorta measures a maximum diameter of 3.2 cm. No abdominal aortic aneurysm. 2. No thoracic or abdominal aortic dissection. 3. Dense mitral valve annular calcifications. Left atrial enlargement. Findings are consistent with mitral valve prolapse and regurgitation. 4. Status post wedge resection of left upper and lower lobe lung nodules. Several sub cm residual lung nodules are stable from the prior PET-CT and chest CT. No new lung abnormalities. No acute lung findings. 5. Atherosclerotic changes along a normal caliber abdominal aorta. No aortic stenosis. 6. 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 No other significant aortic branch vessel stenosis. 7. No acute findings in the abdomen pelvis. 8. Sigmoid colon diverticula without diverticulitis. Stable 4 cm left adnexal cystic lesion. This is stable from the PET-CT. Mild increased stool noted throughout the colon.   Electronically Signed   By: Lajean Manes M.D.   On: 03/29/2016 17:20  Impression:  Patient has stage D severe symptomatic primary mitral regurgitation. She describes stable symptoms of exertional shortness of breath consistent with chronic diastolic congestive heart failure, New York Heart Association functional class II years the patient also has long-standing history of heavy  tobacco abuse, mild COPD and history of lung cancer treated surgically without any evidence for recurrence. She has finally quit smoking.   Plan:  The patient was again counseled at length regarding the indications, risks and  potential benefits of mitral valve repair.  The rationale for elective surgery has been explained, including a comparison between surgery and continued medical therapy with close follow-up.  The likelihood of successful and durable valve repair has been discussed with particular reference to the findings of their recent echocardiogram.  Based upon these findings and previous experience, I have quoted them a somewhat greater than 80 percent likelihood of successful valve repair.  In the unlikely event that their valve cannot be successfully repaired, we discussed the possibility of replacing the mitral valve using a mechanical prosthesis with the attendant need for long-term anticoagulation versus the alternative of replacing it using a bioprosthetic tissue valve with its potential for late structural valve deterioration and failure, depending upon the patient's longevity.  The patient specifically requests that if the mitral valve must be replaced that it be done using a bioprosthetic tissue valve.   The patient understands and accepts all potential risks of surgery including but not limited to risk of death, stroke or other neurologic complication, myocardial infarction, congestive heart failure, respiratory failure, renal failure, bleeding requiring transfusion and/or reexploration, arrhythmia, infection or other wound complications, pneumonia, pleural and/or pericardial effusion, pulmonary embolus, aortic dissection or other major vascular complication, or delayed complications related to valve repair or replacement including but not limited to structural valve deterioration and failure, thrombosis, embolization, endocarditis, or paravalvular leak.  Alternative surgical approaches have been discussed including a comparison between conventional sternotomy and minimally-invasive techniques.  The relative risks and benefits of each have been reviewed as they pertain to the patient's specific circumstances, and all  of their questions have been addressed.  Specific risks potentially related to the minimally-invasive approach were discussed at length, including but not limited to risk of conversion to full or partial sternotomy, aortic dissection or other major vascular complication, unilateral acute lung injury or pulmonary edema, phrenic nerve dysfunction or paralysis, rib fracture, chronic pain, lung hernia, or lymphocele. All of her questions have been answered.  We tentatively plan to proceed with surgery on 06/06/2015. The patient will return to our office for follow-up prior to surgery on 05/27/2016.    I spent in excess of 15 minutes during the conduct of this office consultation and >50% of this time involved direct face-to-face encounter with the patient for counseling and/or coordination of their care.    Valentina Gu. Roxy Manns, MD 04/22/2016 4:27 PM

## 2016-04-22 NOTE — Patient Instructions (Signed)
Continue all previous medications without any changes at this time  

## 2016-04-23 ENCOUNTER — Other Ambulatory Visit: Payer: Self-pay | Admitting: *Deleted

## 2016-05-01 ENCOUNTER — Emergency Department (HOSPITAL_COMMUNITY): Payer: Medicare Other

## 2016-05-01 ENCOUNTER — Telehealth: Payer: Self-pay | Admitting: *Deleted

## 2016-05-01 ENCOUNTER — Emergency Department (HOSPITAL_COMMUNITY)
Admission: EM | Admit: 2016-05-01 | Discharge: 2016-05-01 | Disposition: A | Discharge status: Home / Self Care | Payer: Medicare Other | Attending: Emergency Medicine | Admitting: Emergency Medicine

## 2016-05-01 ENCOUNTER — Encounter (HOSPITAL_COMMUNITY): Payer: Self-pay | Admitting: *Deleted

## 2016-05-01 DIAGNOSIS — Z7982 Long term (current) use of aspirin: Secondary | ICD-10-CM | POA: Diagnosis not present

## 2016-05-01 DIAGNOSIS — Z85118 Personal history of other malignant neoplasm of bronchus and lung: Secondary | ICD-10-CM | POA: Diagnosis not present

## 2016-05-01 DIAGNOSIS — R0602 Shortness of breath: Secondary | ICD-10-CM | POA: Diagnosis not present

## 2016-05-01 DIAGNOSIS — R079 Chest pain, unspecified: Secondary | ICD-10-CM | POA: Diagnosis not present

## 2016-05-01 DIAGNOSIS — F17210 Nicotine dependence, cigarettes, uncomplicated: Secondary | ICD-10-CM | POA: Insufficient documentation

## 2016-05-01 DIAGNOSIS — J45909 Unspecified asthma, uncomplicated: Secondary | ICD-10-CM | POA: Insufficient documentation

## 2016-05-01 DIAGNOSIS — E039 Hypothyroidism, unspecified: Secondary | ICD-10-CM | POA: Diagnosis not present

## 2016-05-01 LAB — I-STAT TROPONIN, ED
TROPONIN I, POC: 0 ng/mL (ref 0.00–0.08)
TROPONIN I, POC: 0 ng/mL (ref 0.00–0.08)

## 2016-05-01 LAB — CBC
HCT: 38 % (ref 36.0–46.0)
Hemoglobin: 12.3 g/dL (ref 12.0–15.0)
MCH: 27.9 pg (ref 26.0–34.0)
MCHC: 32.4 g/dL (ref 30.0–36.0)
MCV: 86.2 fL (ref 78.0–100.0)
PLATELETS: 208 10*3/uL (ref 150–400)
RBC: 4.41 MIL/uL (ref 3.87–5.11)
RDW: 13.6 % (ref 11.5–15.5)
WBC: 8.4 10*3/uL (ref 4.0–10.5)

## 2016-05-01 LAB — BASIC METABOLIC PANEL
Anion gap: 12 (ref 5–15)
BUN: 13 mg/dL (ref 6–20)
CALCIUM: 9.5 mg/dL (ref 8.9–10.3)
CHLORIDE: 101 mmol/L (ref 101–111)
CO2: 24 mmol/L (ref 22–32)
CREATININE: 0.84 mg/dL (ref 0.44–1.00)
GFR calc non Af Amer: >60 mL/min (ref >60)
Glucose, Bld: 87 mg/dL (ref 65–99)
Potassium: 3.8 mmol/L (ref 3.5–5.1)
SODIUM: 137 mmol/L (ref 135–145)

## 2016-05-01 LAB — D-DIMER, QUANTITATIVE (NOT AT ARMC): D DIMER QUANT: 1.83 ug{FEU}/mL — AB (ref 0.00–0.50)

## 2016-05-01 MED ORDER — IOPAMIDOL (ISOVUE-370) INJECTION 76%
INTRAVENOUS | Status: AC
  Administered 2016-05-01: 100 mL
  Filled 2016-05-01: qty 100

## 2016-05-01 NOTE — ED Notes (Signed)
Pt back in room.

## 2016-05-01 NOTE — ED Notes (Signed)
Pt transported to CT ?

## 2016-05-01 NOTE — Telephone Encounter (Signed)
Patient called. She stated that she is having chest pain radiating down her left arm with her left hand feeling tingly and numb.  She is also having some sob.  Daughter is with her.  Advised her to come to the ED at [facility].  Daughter is bringing her.  Called Trish at the ED and advised that she is on her way. Is coming from [place].

## 2016-05-01 NOTE — ED Notes (Signed)
Pt ambulatory w/ steady gait to restroom. 

## 2016-05-01 NOTE — ED Notes (Signed)
Lab to add on d-dimer 

## 2016-05-01 NOTE — ED Notes (Addendum)
Contacted lab re: delay in d-dimer results. Will now add-on d-dimer.

## 2016-05-01 NOTE — ED Triage Notes (Addendum)
Pt reports sharp left side neck/shoulder pain that radiates down left arm. Reports mild sob, fatigue and headache.No acute distress is noted at triage and ekg done. Pt is scheduled on 1/17 for mitral valve replacement.

## 2016-05-01 NOTE — Telephone Encounter (Signed)
Will send this to Dr Meda Coffee as an Juluis Rainier on the pts complaints mentioned and referring to the ER for further work-up.

## 2016-05-01 NOTE — ED Provider Notes (Signed)
[facility] DEPT Provider Note   CSN: [id number] Arrival date & time: 05/01/16  1410  By signing my name below, I, Joy Williams, attest that this documentation has been prepared under the direction and in the presence of Fatima Blank, MD.  Electronically Signed: Julien Nordmann, ED Scribe. 05/01/16. 4:15 PM.    History   Chief Complaint Chief Complaint  Patient presents with  . Chest Pain  . Arm Pain   The history is provided by the patient. No language interpreter was used.   HPI Comments: Joy Williams is a 65 y.o. female who has a Pmhx of thyroid disease and lung cancer presents to the Emergency Department complaining of sudden onset, gradually improving, sharp, left sided chest pain that started this afternoon. Pt reports having associated shortness of breath at baseline and left sided neck numbness that radiated into her left arm. Pt says that she had a very stressful morning and her chest pain started after a very stressful phone call. She describes her current pain as dull. Pt notes she called her cardiologist about her symptoms and was told to come to the ED for further evaluation. Pt has a recent heart catheterization performed on 03/08/16. She has a hx of lung cancer and stopped getting chemotherapy at [facility] in December 2016. She is scheduled to have a valve placement in January 2017. Pt denies leg swelling, abdominal pain, recent prolonged travel, recent surgeries, hormonal replacement therapy fever, or cough. She is a smoker.  Past Medical History:  Diagnosis Date  . Allergy   . Asthma   . Complication of anesthesia   . Family history of adverse reaction to anesthesia    Mother- very sensitive to medication  . Heart murmur    MVP  . Hypothyroidism   . 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 MVP (mitral valve prolapse)     . PONV (postoperative nausea and vomiting)   . Raynaud's disease   . Severe mitral regurgitation 11/15/2014  . Shortness of breath dyspnea    with exertion  . Thyroid disease     Patient Active Problem List   Diagnosis Date Noted  . Lung cancer ([place]) 12/30/2014  . Severe mitral regurgitation 11/15/2014  . 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 Asthmatic bronchitis , chronic ([place]) 10/03/2014  . Multiple pulmonary nodules 10/03/2014  . Cigarette smoker 10/03/2014    Past Surgical History:  Procedure Laterality Date  . BACK SURGERY     x 3  Disectomy  . CARDIAC CATHETERIZATION N/A 03/08/2016   Procedure: Right/Left Heart Cath and Coronary Angiography;  Surgeon: Sherren Mocha, MD;  Location: [facility];  Service: Cardiovascular;  Laterality: N/A;  . CLAVICLE SURGERY Left 2013   plate to left collar bone  . LAPAROSCOPY     ? reason-age 12   . TEE WITHOUT CARDIOVERSION N/A 02/22/2016   Procedure: TRANSESOPHAGEAL ECHOCARDIOGRAM (TEE);  Surgeon: Dorothy Spark, MD;  Location: [facility];  Service: Cardiovascular;  Laterality: N/A;  . TONSILLECTOMY    . VIDEO ASSISTED THORACOSCOPY (VATS)/WEDGE RESECTION Left 12/30/2014   Bronchoscopy, Mediastinoscopy, Left VATS for Wedge resection LUL x2 adn LLL x1 - Dr. Elenor Quinones at [facility]  . wrist surgery Left 2015   plate to wrist     OB History    No data available       Home Medications  Prior to Admission medications   Medication Sig Start Date End Date Taking? Authorizing Provider  aspirin EC 81 MG tablet Take 81 mg by mouth daily.    Historical Provider, MD  DULERA 100-5 MCG/ACT AERO USE 2 PUFFS FIRST THING IN THE MORNING AND 2 PUFFS 12 HOURS LATER 02/13/16   Tanda Rockers, MD  levothyroxine (SYNTHROID, LEVOTHROID) 175 MCG tablet Take 175 mcg by mouth daily before breakfast.    Historical Provider, MD  Multiple Vitamins-Minerals (MULTI VITAMIN/MINERALS PO) Take 1 tablet by mouth daily.     Historical Provider, MD  Multiple  Vitamins-Minerals (ZINC PO) Take 1 tablet by mouth daily.    Historical Provider, MD  naproxen sodium (ANAPROX) 220 MG tablet Take 220 mg by mouth 2 (two) times daily as needed (pain).    Historical Provider, MD  Omega-3 Fatty Acids (FISH OIL PO) Take 1 capsule by mouth daily.     Historical Provider, MD    Family History Family History  Problem Relation Age of Onset  . Mitral valve prolapse Mother   . Dementia Father   . Prostate cancer Father   . Mitral valve prolapse Brother   . Mitral valve prolapse Sister   . Colon cancer Neg Hx   . Colon polyps Neg Hx   . Rectal cancer Neg Hx   . Esophageal cancer Neg Hx   . Stomach cancer Neg Hx   . Heart attack Neg Hx     Social History Social History  Substance Use Topics  . Smoking status: Current Every Day Smoker    Packs/day: 0.00    Years: 24.00    Types: Cigarettes  . Smokeless tobacco: Never Used     Comment: SMOKES ABOUT 4 CIGS A DAY  . Alcohol use 0.6 oz/week    1 Shots of liquor per week     Comment: socially     Allergies   Penicillins   Review of Systems Review of Systems  A complete 10 system review of systems was obtained and all systems are negative except as noted in the HPI and PMH.    Physical Exam Updated Vital Signs BP (!) 102/48 (BP Location: Right Arm)   Pulse 70   Temp 98.1 F (36.7 C) (Oral)   Resp 18   SpO2 99%   Physical Exam  Constitutional: She is oriented to person, place, and time. She appears well-developed and well-nourished. No distress.  Thin  HENT:  Head: Normocephalic and atraumatic.  Nose: Nose normal.  Eyes: Conjunctivae and EOM are normal. Pupils are equal, round, and reactive to light. Right eye exhibits no discharge. Left eye exhibits no discharge. No scleral icterus.  Neck: Normal range of motion. Neck supple.  Cardiovascular: Normal rate and regular rhythm.  Exam reveals no gallop and no friction rub.   No murmur heard. Pulmonary/Chest: Effort normal and breath sounds  normal. No stridor. No respiratory distress. She has no rales.  Abdominal: Soft. She exhibits no distension. There is no tenderness.  Musculoskeletal: She exhibits no edema or tenderness.  Neurological: She is alert and oriented to person, place, and time.  Skin: Skin is warm and dry. No rash noted. She is not diaphoretic. No erythema.  Psychiatric: She has a normal mood and affect.  Nursing note and vitals reviewed.    ED Treatments / Results  DIAGNOSTIC STUDIES: Oxygen Saturation is 99% on RA, normal by my interpretation.  COORDINATION OF CARE:  4:10 PM Discussed treatment plan with pt at bedside and  pt agreed to plan.  Labs (all labs ordered are listed, but only abnormal results are displayed) Labs Reviewed  [place], ED    EKG  EKG Interpretation  Date/Time:  Wednesday May 01 2016 14:23:48 EST Ventricular Rate:  72 PR Interval:  174 QRS Duration: 80 QT Interval:  436 QTC Calculation: 477 R Axis:   67 Text Interpretation:  Normal sinus rhythm Normal ECG No significant change since last tracing Confirmed by [facility] MD, PEDRO ([id number]) on 05/01/2016 3:19:53 PM       Radiology Dg Chest 2 View  Result Date: 05/01/2016 CLINICAL DATA:  Left and arm and chest pain since this morning. History of asthma, mitral valve prolapse, lung malignancy, current smoker. EXAM: CHEST  2 VIEW COMPARISON:  CT scan of the chest of March 29, 2016 and chest x-ray of June 07, 2015. FINDINGS: The lungs are hyperinflated. There is no focal infiltrate. There are chronic stable changes in the left upper lobe. There is mild stable shift of mediastinum toward the left. The heart is normal in size. There is dense calcification of the mitral valvular annulus. There is calcification in the wall of the aortic arch. There is no pleural effusion or pneumothorax. The bony thorax exhibits no acute abnormality. Plated screw fixation of a mid and distal left clavicular  shaft fracture is stable. IMPRESSION: COPD. Chronic postsurgical changes in the left upper lobe. Dense mitral annular calcification. No CHF nor pneumonia. Thoracic aortic atherosclerosis. Electronically Signed   By: David  Martinique M.D.   On: 05/01/2016 14:42    Procedures Procedures (including critical care time)  Medications Ordered in ED Medications - No data to display   Initial Impression / Assessment and Plan / ED Course  I have reviewed the triage vital signs and the nursing notes.  Pertinent labs & imaging results that were available during my care of the patient were reviewed by me and considered in my medical decision making (see chart for details).  Clinical Course     Atypical chest pain. EKG without acute ischemic changes or evidence of pericarditis.Chest x-ray without evidence suggestive of pneumonia, pneumothorax, pneumomediastinum.  No abnormal contour of the mediastinum to suggest dissection. No evidence of acute injuries. Initial troponin negative. Patient recently had a clean heart cath in October of this year. Feel she is appropriate for second troponin to rule out ACS. Second serial troponin was negative. Given the patient's history of lung cancer PE was considered. D-dimer was elevated however CT without evidence of pulmonary embolism. No evidence of dissection, pneumonia, pneumothorax, pneumomediastinum on CT. Rest of labs reassuring.  Feel she is appropriate for discharge with strict return precautions and close PCP follow-up.    Final Clinical Impressions(s) / ED Diagnoses   Final diagnoses:  None   Disposition: Discharge  Condition: Good  I have discussed the results, Dx and Tx plan with the patient who expressed understanding and agree(s) with the plan. Discharge instructions discussed at great length. The patient was given strict return precautions who verbalized understanding of the instructions. No further questions at time of discharge.    Current  Discharge Medication List      Follow Up: Velna Hatchet, MD [place] [address] [phone]  Schedule an appointment as soon as possible for a visit  in 5-7 days, If symptoms do not improve or  worsen   I personally performed the services described in this documentation, which was scribed in my presence. The  recorded information has been reviewed and is accurate.        Fatima Blank, MD 05/01/16 2028

## 2016-05-15 ENCOUNTER — Ambulatory Visit: Payer: PRIVATE HEALTH INSURANCE | Admitting: Cardiology

## 2016-05-20 DIAGNOSIS — I341 Nonrheumatic mitral (valve) prolapse: Secondary | ICD-10-CM

## 2016-05-20 HISTORY — DX: Nonrheumatic mitral (valve) prolapse: I34.1

## 2016-05-27 ENCOUNTER — Encounter: Payer: Self-pay | Admitting: Thoracic Surgery (Cardiothoracic Vascular Surgery)

## 2016-05-27 ENCOUNTER — Ambulatory Visit (INDEPENDENT_AMBULATORY_CARE_PROVIDER_SITE_OTHER): Payer: Medicare Other | Admitting: Thoracic Surgery (Cardiothoracic Vascular Surgery)

## 2016-05-27 VITALS — BP 103/61 | HR 66 | Temp 97.9°F | Resp 16 | Ht 67.0 in | Wt 135.0 lb

## 2016-05-27 DIAGNOSIS — I340 Nonrheumatic mitral (valve) insufficiency: Secondary | ICD-10-CM

## 2016-05-27 NOTE — Patient Instructions (Signed)
Stop smoking  Continue all previous medications without any changes at this time

## 2016-05-27 NOTE — Progress Notes (Signed)
[address]       ,[place] [address]             [phone]     CARDIOTHORACIC SURGERY OFFICE NOTE  Referring Provider is Dorothy Spark, MD PCP is Velna Hatchet, MD   HPI:  Patient returns to the office today for follow-up of stage D severe symptomatic primary mitral regurgitation. She was originally seen in consultation on 02/29/2016 and she was last seen here in our office on 04/22/2016. At that time she claimed that she had successfully quit smoking and we made tentative plans to proceed with surgery next week. She returns to our office today and reports that shortly after Christmas she came down with a severe upper respiratory tract infection that was going around and her entire family. Although she has gotten better she reports that she still has severe nasal congestion, runny nose, and a productive cough. She denies any fevers or chills. Early on in her illness she had some myalgias and diffuse malaise, but this has improved. She denies any purulent sputum production. She is concerned that her illness may affect her ability to undergo surgery next week. She also admits that she has gone back to smoking cigarettes.   Current Outpatient Prescriptions  Medication Sig Dispense Refill  . aspirin EC 81 MG tablet Take 81 mg by mouth daily.    . DULERA 100-5 MCG/ACT AERO USE 2 PUFFS FIRST THING IN THE MORNING AND 2 PUFFS 12 HOURS LATER 3 Inhaler 0  . levothyroxine (SYNTHROID, LEVOTHROID) 175 MCG tablet Take 175 mcg by mouth daily before breakfast.    . Multiple Vitamins-Minerals (MULTI VITAMIN/MINERALS PO) Take 1 tablet by mouth daily.     . Multiple Vitamins-Minerals (ZINC PO) Take 1 tablet by mouth daily.    . naproxen sodium (ANAPROX) 220 MG tablet Take 220 mg by mouth 2 (two) times daily as needed (pain).    . Omega-3 Fatty Acids (FISH OIL PO) Take 1 capsule by mouth daily.      No current facility-administered medications for this visit.        Physical Exam:   BP 103/61 (BP Location: Right Arm, Patient Position: Sitting, Cuff Size: Large)   Pulse 66   Temp 97.9 F (36.6 C) (Oral)   Resp 16   Ht '5\' 7"'$  (1.702 m)   Wt 135 lb (61.2 kg)   SpO2 98% Comment: ON RA  BMI 21.14 kg/m   General:  Well-appearing  Chest:   Clear to auscultation  CV:   Regular rate and rhythm with holosystolic murmur  Incisions:  n/a  Abdomen:  Soft nontender  Extremities:  Warm and well-perfused  Diagnostic Tests:  n/a   Impression:  Patient has stage D severe symptomatically primary mitral regurgitation. Unfortunately, she has recently developed acute respiratory tract infection consistent with possible influenza. She seems to be getting better but she is still not back to 100%. She still has significant nasal congestion and a productive cough. Breath sounds are clear and she does not have fevers, so I doubt that she has pneumonia and there are no other findings to suggest a bacterial infection. Unfortunately, it probably makes sense that we should postpone surgery for at least a week or 2 to allow her to complete her recovery. Finally, she needs to find a way to quit smoking again.  Plan:  We will postpone surgery and plan to tentatively reschedule for 06/27/2016. I have strongly encouraged the patient  to find a way to stop smoking again. She will continue to use over-the-counter medications as needed for symptomatic improvement. If she develops fevers, chills, or purulent cough she will call and return to see [place]. Otherwise we will plan to see her on Monday, 06/24/2016 prior to surgery. All of her questions about addressed.  I spent in excess of 15 minutes during the conduct of this office consultation and >50% of this time involved direct face-to-face encounter with the patient for counseling and/or coordination of their care.    Valentina Gu. Roxy Manns, MD 05/27/2016 6:25 PM

## 2016-05-28 ENCOUNTER — Other Ambulatory Visit: Payer: Self-pay | Admitting: *Deleted

## 2016-05-28 DIAGNOSIS — I340 Nonrheumatic mitral (valve) insufficiency: Secondary | ICD-10-CM

## 2016-06-03 ENCOUNTER — Other Ambulatory Visit (HOSPITAL_COMMUNITY): Payer: Medicare Other

## 2016-06-03 ENCOUNTER — Ambulatory Visit (HOSPITAL_COMMUNITY): Payer: Medicare Other

## 2016-06-16 ENCOUNTER — Other Ambulatory Visit: Payer: Self-pay | Admitting: Internal Medicine

## 2016-06-21 ENCOUNTER — Telehealth: Payer: Self-pay | Admitting: Internal Medicine

## 2016-06-21 NOTE — Telephone Encounter (Signed)
Called [facility] at [phone] to do the PA for the [place].  This has gone over for clinical review and will take 24-72 hours to get the fax of these results.    I have called the pt and she is aware of this.  Will hold in triage to follow up on Monday.

## 2016-06-24 ENCOUNTER — Encounter (HOSPITAL_COMMUNITY): Payer: Self-pay

## 2016-06-24 ENCOUNTER — Encounter: Payer: Self-pay | Admitting: Thoracic Surgery (Cardiothoracic Vascular Surgery)

## 2016-06-24 ENCOUNTER — Ambulatory Visit (HOSPITAL_BASED_OUTPATIENT_CLINIC_OR_DEPARTMENT_OTHER)
Admission: RE | Admit: 2016-06-24 | Discharge: 2016-06-24 | Disposition: A | Discharge status: Home / Self Care | Payer: Medicare Other | Source: Ambulatory Visit | Attending: Thoracic Surgery (Cardiothoracic Vascular Surgery) | Admitting: Thoracic Surgery (Cardiothoracic Vascular Surgery)

## 2016-06-24 ENCOUNTER — Ambulatory Visit (INDEPENDENT_AMBULATORY_CARE_PROVIDER_SITE_OTHER): Payer: Medicare Other | Admitting: Thoracic Surgery (Cardiothoracic Vascular Surgery)

## 2016-06-24 ENCOUNTER — Ambulatory Visit (HOSPITAL_COMMUNITY)
Admission: RE | Admit: 2016-06-24 | Discharge: 2016-06-24 | Disposition: A | Discharge status: Home / Self Care | Payer: Medicare Other | Source: Ambulatory Visit | Attending: Thoracic Surgery (Cardiothoracic Vascular Surgery) | Admitting: Thoracic Surgery (Cardiothoracic Vascular Surgery)

## 2016-06-24 ENCOUNTER — Other Ambulatory Visit: Payer: Self-pay | Admitting: *Deleted

## 2016-06-24 ENCOUNTER — Other Ambulatory Visit (HOSPITAL_COMMUNITY): Payer: Self-pay | Admitting: *Deleted

## 2016-06-24 ENCOUNTER — Encounter (HOSPITAL_COMMUNITY)
Admission: RE | Admit: 2016-06-24 | Discharge: 2016-06-24 | Disposition: A | Discharge status: Home / Self Care | Payer: Medicare Other | Source: Ambulatory Visit | Attending: Thoracic Surgery (Cardiothoracic Vascular Surgery) | Admitting: Thoracic Surgery (Cardiothoracic Vascular Surgery)

## 2016-06-24 VITALS — BP 99/65 | HR 76 | Resp 20 | Ht 67.0 in | Wt 135.0 lb

## 2016-06-24 DIAGNOSIS — I340 Nonrheumatic mitral (valve) insufficiency: Secondary | ICD-10-CM

## 2016-06-24 DIAGNOSIS — Z01812 Encounter for preprocedural laboratory examination: Secondary | ICD-10-CM | POA: Insufficient documentation

## 2016-06-24 DIAGNOSIS — I369 Nonrheumatic tricuspid valve disorder, unspecified: Secondary | ICD-10-CM | POA: Diagnosis not present

## 2016-06-24 DIAGNOSIS — Q228 Other congenital malformations of tricuspid valve: Secondary | ICD-10-CM

## 2016-06-24 DIAGNOSIS — I341 Nonrheumatic mitral (valve) prolapse: Secondary | ICD-10-CM | POA: Diagnosis not present

## 2016-06-24 DIAGNOSIS — D696 Thrombocytopenia, unspecified: Secondary | ICD-10-CM | POA: Diagnosis not present

## 2016-06-24 DIAGNOSIS — R918 Other nonspecific abnormal finding of lung field: Secondary | ICD-10-CM | POA: Diagnosis not present

## 2016-06-24 DIAGNOSIS — I6523 Occlusion and stenosis of bilateral carotid arteries: Secondary | ICD-10-CM

## 2016-06-24 DIAGNOSIS — M341 CR(E)ST syndrome: Secondary | ICD-10-CM | POA: Diagnosis not present

## 2016-06-24 DIAGNOSIS — Z01810 Encounter for preprocedural cardiovascular examination: Secondary | ICD-10-CM

## 2016-06-24 DIAGNOSIS — Z0183 Encounter for blood typing: Secondary | ICD-10-CM

## 2016-06-24 DIAGNOSIS — E871 Hypo-osmolality and hyponatremia: Secondary | ICD-10-CM | POA: Diagnosis not present

## 2016-06-24 DIAGNOSIS — J449 Chronic obstructive pulmonary disease, unspecified: Secondary | ICD-10-CM | POA: Diagnosis not present

## 2016-06-24 DIAGNOSIS — I272 Pulmonary hypertension, unspecified: Secondary | ICD-10-CM | POA: Diagnosis not present

## 2016-06-24 DIAGNOSIS — C3492 Malignant neoplasm of unspecified part of left bronchus or lung: Secondary | ICD-10-CM | POA: Diagnosis not present

## 2016-06-24 DIAGNOSIS — I959 Hypotension, unspecified: Secondary | ICD-10-CM | POA: Diagnosis not present

## 2016-06-24 DIAGNOSIS — D591 Other autoimmune hemolytic anemias: Secondary | ICD-10-CM | POA: Diagnosis not present

## 2016-06-24 DIAGNOSIS — Z01818 Encounter for other preprocedural examination: Secondary | ICD-10-CM | POA: Insufficient documentation

## 2016-06-24 DIAGNOSIS — D62 Acute posthemorrhagic anemia: Secondary | ICD-10-CM | POA: Diagnosis not present

## 2016-06-24 DIAGNOSIS — I700 Atherosclerosis of aorta: Secondary | ICD-10-CM

## 2016-06-24 DIAGNOSIS — I5032 Chronic diastolic (congestive) heart failure: Secondary | ICD-10-CM | POA: Diagnosis not present

## 2016-06-24 DIAGNOSIS — F17210 Nicotine dependence, cigarettes, uncomplicated: Secondary | ICD-10-CM | POA: Diagnosis not present

## 2016-06-24 DIAGNOSIS — Z9221 Personal history of antineoplastic chemotherapy: Secondary | ICD-10-CM | POA: Diagnosis not present

## 2016-06-24 HISTORY — DX: Nevus, non-neoplastic: I78.1

## 2016-06-24 LAB — URINALYSIS, ROUTINE W REFLEX MICROSCOPIC
Bacteria, UA: NONE SEEN
Bilirubin Urine: NEGATIVE
Glucose, UA: NEGATIVE mg/dL
Hgb urine dipstick: NEGATIVE
Ketones, ur: NEGATIVE mg/dL
Nitrite: NEGATIVE
Protein, ur: NEGATIVE mg/dL
Specific Gravity, Urine: 1.013 (ref 1.005–1.030)
WBC, UA: NONE SEEN WBC/hpf (ref 0–5)
pH: 5 (ref 5.0–8.0)

## 2016-06-24 LAB — BLOOD GAS, ARTERIAL
Acid-Base Excess: 2 mmol/L (ref 0.0–2.0)
Bicarbonate: 25.7 mmol/L (ref 20.0–28.0)
Drawn by: 470591
FIO2: 21
O2 Saturation: 98.1 %
Patient temperature: 98.6
pCO2 arterial: 37.9 mmHg (ref 32.0–48.0)
pH, Arterial: 7.447 (ref 7.350–7.450)
pO2, Arterial: 111 mmHg — ABNORMAL HIGH (ref 83.0–108.0)

## 2016-06-24 LAB — VAS US DOPPLER PRE CABG
LCCAPDIAS: 13 cm/s
LEFT ECA DIAS: -18 cm/s
LEFT VERTEBRAL DIAS: 24 cm/s
LICAPDIAS: -20 cm/s
Left CCA dist dias: 22 cm/s
Left CCA dist sys: 83 cm/s
Left CCA prox sys: 106 cm/s
Left ICA dist dias: -27 cm/s
Left ICA dist sys: -96 cm/s
Left ICA prox sys: -86 cm/s
RCCAPDIAS: 19 cm/s
RIGHT ECA DIAS: -17 cm/s
RIGHT VERTEBRAL DIAS: 12 cm/s
Right CCA prox sys: 127 cm/s
Right cca dist sys: -76 cm/s

## 2016-06-24 LAB — CBC
HEMATOCRIT: 33.2 % — AB (ref 36.0–46.0)
HEMOGLOBIN: 10.6 g/dL — AB (ref 12.0–15.0)
MCH: 27.5 pg (ref 26.0–34.0)
MCHC: 31.9 g/dL (ref 30.0–36.0)
MCV: 86.2 fL (ref 78.0–100.0)
Platelets: 188 10*3/uL (ref 150–400)
RBC: 3.85 MIL/uL — ABNORMAL LOW (ref 3.87–5.11)
RDW: 13.7 % (ref 11.5–15.5)
WBC: 7.4 10*3/uL (ref 4.0–10.5)

## 2016-06-24 LAB — COMPREHENSIVE METABOLIC PANEL
ALK PHOS: 48 U/L (ref 38–126)
ALT: 20 U/L (ref 14–54)
ANION GAP: 10 (ref 5–15)
AST: 29 U/L (ref 15–41)
Albumin: 3.7 g/dL (ref 3.5–5.0)
BILIRUBIN TOTAL: 0.3 mg/dL (ref 0.3–1.2)
BUN: 14 mg/dL (ref 6–20)
CALCIUM: 9.2 mg/dL (ref 8.9–10.3)
CO2: 23 mmol/L (ref 22–32)
Chloride: 103 mmol/L (ref 101–111)
Creatinine, Ser: 0.74 mg/dL (ref 0.44–1.00)
GFR calc non Af Amer: >60 mL/min (ref >60)
Glucose, Bld: 91 mg/dL (ref 65–99)
Potassium: 4.4 mmol/L (ref 3.5–5.1)
Sodium: 136 mmol/L (ref 135–145)
TOTAL PROTEIN: 6.4 g/dL — AB (ref 6.5–8.1)

## 2016-06-24 LAB — PROTIME-INR
INR: 0.98
Prothrombin Time: 13 s (ref 11.4–15.2)

## 2016-06-24 LAB — SURGICAL PCR SCREEN
MRSA, PCR: NEGATIVE
Staphylococcus aureus: NEGATIVE

## 2016-06-24 LAB — APTT: aPTT: 28 s (ref 24–36)

## 2016-06-24 NOTE — Progress Notes (Signed)
Pt has hx of severe mitral regurgitation. Denies any other cardiac history or chest pain.

## 2016-06-24 NOTE — Telephone Encounter (Signed)
Patient returned call, CB is [phone].

## 2016-06-24 NOTE — Progress Notes (Signed)
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 She was last seen here in our office on 05/27/2016. We had originally planned for surgery in January but this was postponed because the patient had come down with an upper respiratory tract infection. Surgery was rescheduled for 06/27/2016. The patient states that she feels much better and her flulike symptoms completely resolved within 2 weeks ago. She quit smoking and has been exercising on a regular basis. She still reports some exertional shortness of breath, but this only occurs with more strenuous physical activity. Overall she feels well and she is looking forward to getting the surgery put behind her. The remainder of her review of systems is unchanged from previously.   Current Outpatient Prescriptions  Medication Sig Dispense Refill  . aspirin EC 81 MG tablet Take 81 mg by mouth daily.    . COCONUT OIL PO Take 15 mLs by mouth See admin instructions. Daily with coffee    . DULERA 100-5 MCG/ACT AERO INHALE 2 PUFFS BY MOUTH FIRST THING IN THE MORNING AND 2 PUFFS 12 HOURS LATER 1 Inhaler 0  . levothyroxine (SYNTHROID, LEVOTHROID) 175 MCG tablet Take 175 mcg by mouth daily before breakfast.    . Multiple Vitamin (MULTIVITAMIN) LIQD Take  30 mLs by mouth daily. VM-100 COMPLETE MULTIVITAMIN    . naproxen sodium (ANAPROX) 220 MG tablet Take 220 mg by mouth 2 (two) times daily as needed (FOR BACK PAIN/ACHES.).     Marland Kitchen Omega-3 Fatty Acids (FISH OIL) 1200 MG CAPS Take 1,200 mg by mouth daily.     No current facility-administered medications for this visit.       Physical Exam:   BP 99/65   Pulse 76   Resp 20   Ht '5\' 7"'$  (1.702 m)   Wt 135 lb (61.2 kg)   SpO2 97%   BMI 21.14 kg/m   General:  Well-appearing  Chest:   Clear to auscultation  CV:   Regular rate and rhythm with prominent systolic murmur  Incisions:  n/a  Abdomen:  Soft nontender  Extremities:  Warm and well-perfused  Diagnostic Tests:  n/a   Impression:  Patient has stage D severe symptomatic primary mitral regurgitation. She describes stable symptoms of exertional shortness of breath consistent with chronic diastolic congestive heart failure, New York Heart Association functional class II.  The patient also has long-standing history of heavy tobacco abuse, mild COPD and history of lung cancer treated surgically without any evidence for recurrence. She has finally quit smoking.  I have personally reviewed her transthoracic and transesophageal echocardiograms, diagnostic cardiac catheterization, and CT angiograms. 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 There is severe thickening with excessive leaflet tissue involving both the anterior  and the posterior leaflets. There is elongation of most if not all of the primary chordae tendineae to both leaflets. The jet of regurgitation is central and crosses the entire left atrium. There do not appear to be any ruptured chordae tendineaeor flail segments. Using PISA the ERO was measured 0.44 centimeters squared corresponding to a regurgitant volume estimated 93 mL. There was flow reversal in the pulmonary veins. The left atrium is severely dilated. Left  ventricular size and systolic function appears normal.   Diagnostic cardiac catheterization is notable for the absence of significant coronary artery disease.  Pulmonary artery pressures were very mildly elevated.  PFTs reveal relatively mild obstructive disease with adequate total lung capacity and moderately reduced diffusion capacity.  CT angiography revealed no contraindications to peripheral arterial cannulation and a minimally invasive approach for surgery.    Plan:  The patient was again counseled at length regarding the indications, risks and potential benefits of mitral valve repair.  The rationale for elective surgery has been explained, including a comparison between surgery and continued medical therapy with close follow-up.  The likelihood of successful and durable valve repair has been discussed with particular reference to the findings of their recent echocardiogram.  Based upon these findings and previous experience, I have quoted her an 67 percent likelihood of successful valve repair with rate of repair somewhat reduced because of the significant calcification in the mitral valve annulus.  In the event that her valve cannot be successfully repaired, we discussed the possibility of replacing the mitral valve using a mechanical prosthesis with the attendant need for long-term anticoagulation versus the alternative of replacing it using a bioprosthetic tissue valve with its potential for late structural valve deterioration and failure, depending upon the patient's longevity.  The patient specifically requests that if the mitral valve must be replaced that it be done using a bioprosthetic tissue valve.   The patient understands and accepts all potential risks of surgery including but not limited to risk of death, stroke or other neurologic complication, myocardial infarction, congestive heart failure, respiratory failure, renal failure, bleeding requiring transfusion and/or reexploration,  arrhythmia, infection or other wound complications, pneumonia, pleural and/or pericardial effusion, pulmonary embolus, aortic dissection or other major vascular complication, or delayed complications related to valve repair or replacement including but not limited to structural valve deterioration and failure, thrombosis, embolization, endocarditis, or paravalvular leak.  Alternative surgical approaches have been discussed including a comparison between conventional sternotomy and minimally-invasive techniques.  The relative risks and benefits of each have been reviewed as they pertain to the patient's specific circumstances, and all of their questions have been addressed.  Specific risks potentially related to the minimally-invasive approach were discussed at length, including but not limited to risk of conversion to full or partial sternotomy, aortic dissection or other major vascular complication, unilateral acute lung injury or pulmonary edema, phrenic nerve dysfunction or paralysis, rib fracture, chronic pain, lung hernia, or lymphocele.  Expectations for her postoperative convalescence has been discussed.  All of her questions have been answered.   I spent in excess of 15 minutes during the conduct of this office consultation and >50% of this time involved direct face-to-face encounter with the patient for counseling and/or coordination of their care.    Valentina Gu. Roxy Manns, MD 06/24/2016 2:28 PM

## 2016-06-24 NOTE — Telephone Encounter (Signed)
symbicort 80 2bid same rx

## 2016-06-24 NOTE — Pre-Procedure Instructions (Addendum)
Joy Williams  06/24/2016    Your procedure is scheduled on Thursday, June 27, 2016 at 7:30 AM.   Report to [facility] Entrance "A" Admitting Office at 5:30 AM.   Call this number if you have problems the morning of surgery: [phone]   Questions prior to day of surgery, please call [phone] between 8 & 4 PM.   Remember:  Do not eat food or drink liquids after midnight Wednesday, 06/26/16.  Take these medicines the morning of surgery with A SIP OF WATER: Levothyroxine (Synthroid), Dulera inhaler  Stop NSAIDS (Naproxen, Aleve, Ibuprofen, etc), Fish Oil, Multivitamins and Coconut Oil as of today.   Do not wear jewelry, make-up or nail polish.  Do not wear lotions, powders, perfumes or deodorant.  Do not shave 48 hours prior to surgery.    Do not bring valuables to the hospital.  [facility] is not responsible for any belongings or valuables.  Contacts, dentures or bridgework may not be worn into surgery.  Leave your suitcase in the car.  After surgery it may be brought to your room.  For patients admitted to the hospital, discharge time will be determined by your treatment team.  Special instructions:  [place] - Preparing for Surgery  Before surgery, you can play an important role.  Because skin is not sterile, your skin needs to be as free of germs as possible.  You can reduce the number of germs on you skin by washing with CHG (chlorahexidine gluconate) soap before surgery.  CHG is an antiseptic cleaner which kills germs and bonds with the skin to continue killing germs even after washing.  Please DO NOT use if you have an allergy to CHG or antibacterial soaps.  If your skin becomes reddened/irritated stop using the CHG and inform your nurse when you arrive at Short Stay.  Do not shave (including legs and underarms) for at least 48 hours prior to the first CHG shower.  You may shave your face.  Please follow these instructions carefully:   1.  Shower with  CHG Soap the night before surgery and the                    morning of Surgery.  2.  If you choose to wash your hair, wash your hair first as usual with your       normal shampoo.  3.  After you shampoo, rinse your hair and body thoroughly to remove the shampoo.  4.  Use CHG as you would any other liquid soap.  You can apply chg directly       to the skin and wash gently with scrungie or a clean washcloth.  5.  Apply the CHG Soap to your body ONLY FROM THE NECK DOWN.        Do not use on open wounds or open sores.  Avoid contact with your eyes, ears, mouth and genitals (private parts).  Wash genitals (private parts) with your normal soap.  6.  Wash thoroughly, paying special attention to the area where your surgery        will be performed.  7.  Thoroughly rinse your body with warm water from the neck down.  8.  DO NOT shower/wash with your normal soap after using and rinsing off       the CHG Soap.  9.  Pat yourself dry with a clean towel.            10.  Wear clean pajamas.            11.  Place clean sheets on your bed the night of your first shower and do not        sleep with pets.  Day of Surgery  Do not apply any lotions/deodorants the morning of surgery.  Please wear clean clothes to the hospital.   Please read over the fact sheets that you were given.

## 2016-06-24 NOTE — Telephone Encounter (Signed)
Received form stating that [name] was denied They prefer symbicort, breo or advair  Please advise, thanks

## 2016-06-24 NOTE — Telephone Encounter (Signed)
Spoke with pt. She was busy at the time of the call. Will call us back.

## 2016-06-24 NOTE — Progress Notes (Signed)
Pre-op Cardiac Surgery  Carotid Findings:  Bilateral: No significant (1-39%) ICA stenosis. Antegrade vertebral flow.    Upper Extremity Right Left  Brachial Pressures 107 108  Radial Waveforms Tri Tri  Ulnar Waveforms Tri Tri  Palmar Arch (Allen's Test) Obliterates with radial compression, normal with ulnar compression Normal    Landry Mellow, RDMS, RVT 06/24/2016

## 2016-06-24 NOTE — Telephone Encounter (Signed)
lmtcb x1 for pt. 

## 2016-06-24 NOTE — Patient Instructions (Addendum)
Stop taking all medications and supplements except for your Synthroid and Dulera inhaler  Continue taking all other medications without change through the day before surgery.  Have nothing to eat or drink after midnight the night before surgery.  On the morning of surgery take only Synthroid with a sip of water.  You may use your Dulera inhaler.

## 2016-06-25 ENCOUNTER — Encounter (HOSPITAL_COMMUNITY)
Admission: RE | Admit: 2016-06-25 | Discharge: 2016-06-25 | Disposition: A | Discharge status: Home / Self Care | Payer: Medicare Other | Source: Ambulatory Visit | Attending: Thoracic Surgery (Cardiothoracic Vascular Surgery) | Admitting: Thoracic Surgery (Cardiothoracic Vascular Surgery)

## 2016-06-25 ENCOUNTER — Other Ambulatory Visit: Payer: Self-pay | Admitting: *Deleted

## 2016-06-25 DIAGNOSIS — I340 Nonrheumatic mitral (valve) insufficiency: Secondary | ICD-10-CM

## 2016-06-25 DIAGNOSIS — Z0183 Encounter for blood typing: Secondary | ICD-10-CM | POA: Insufficient documentation

## 2016-06-25 LAB — HEMOGLOBIN A1C
Hgb A1c MFr Bld: 5.2 % (ref 4.8–5.6)
Mean Plasma Glucose: 103 mg/dL

## 2016-06-25 NOTE — Telephone Encounter (Signed)
lmtcb x2 for pt. 

## 2016-06-26 ENCOUNTER — Encounter (HOSPITAL_COMMUNITY): Payer: Self-pay | Admitting: Certified Registered Nurse Anesthetist

## 2016-06-26 ENCOUNTER — Other Ambulatory Visit (HOSPITAL_COMMUNITY): Payer: Medicare Other

## 2016-06-26 DIAGNOSIS — D5912 Cold autoimmune hemolytic anemia: Secondary | ICD-10-CM

## 2016-06-26 DIAGNOSIS — D591 Other autoimmune hemolytic anemias: Secondary | ICD-10-CM

## 2016-06-26 HISTORY — DX: Cold autoimmune hemolytic anemia: D59.12

## 2016-06-26 MED ORDER — SODIUM CHLORIDE 0.9 % IV SOLN
30.0000 ug/min | INTRAVENOUS | Status: AC
  Administered 2016-06-27: 15 ug/min via INTRAVENOUS
  Filled 2016-06-26 (×2): qty 2

## 2016-06-26 MED ORDER — CHLORHEXIDINE GLUCONATE 0.12 % MT SOLN
15.0000 mL | Freq: Once | OROMUCOSAL | Status: AC
  Administered 2016-06-27: 15 mL via OROMUCOSAL
  Filled 2016-06-26: qty 15

## 2016-06-26 MED ORDER — VANCOMYCIN HCL 1000 MG IV SOLR
INTRAVENOUS | Status: AC
  Administered 2016-06-27: 500 mL
  Filled 2016-06-26 (×2): qty 1000

## 2016-06-26 MED ORDER — VANCOMYCIN HCL 10 G IV SOLR
1250.0000 mg | INTRAVENOUS | Status: AC
  Administered 2016-06-27: 1250 mg via INTRAVENOUS
  Filled 2016-06-26 (×2): qty 1250

## 2016-06-26 MED ORDER — DEXMEDETOMIDINE HCL IN NACL 400 MCG/100ML IV SOLN
0.1000 ug/kg/h | INTRAVENOUS | Status: AC
  Administered 2016-06-27: .3 ug/kg/h via INTRAVENOUS
  Filled 2016-06-26 (×2): qty 100

## 2016-06-26 MED ORDER — METOPROLOL TARTRATE 12.5 MG HALF TABLET
12.5000 mg | ORAL_TABLET | Freq: Once | ORAL | Status: AC
  Administered 2016-06-27: 12.5 mg via ORAL
  Filled 2016-06-26: qty 1

## 2016-06-26 MED ORDER — TRANEXAMIC ACID (OHS) PUMP PRIME SOLUTION
2.0000 mg/kg | INTRAVENOUS | Status: DC
  Filled 2016-06-26 (×2): qty 1.22

## 2016-06-26 MED ORDER — GLUTARALDEHYDE 0.625% SOAKING SOLUTION
TOPICAL | Status: DC | PRN
Start: 2016-06-27 — End: 2016-06-27
  Filled 2016-06-26: qty 50

## 2016-06-26 MED ORDER — NITROGLYCERIN IN D5W 200-5 MCG/ML-% IV SOLN
2.0000 ug/min | INTRAVENOUS | Status: AC
  Administered 2016-06-27: 5 ug/min via INTRAVENOUS
  Filled 2016-06-26 (×2): qty 250

## 2016-06-26 MED ORDER — SODIUM CHLORIDE 0.9 % IV SOLN
INTRAVENOUS | Status: AC
  Administered 2016-06-27: 1 [IU]/h via INTRAVENOUS
  Filled 2016-06-26 (×2): qty 2.5

## 2016-06-26 MED ORDER — DOPAMINE-DEXTROSE 3.2-5 MG/ML-% IV SOLN
0.0000 ug/kg/min | INTRAVENOUS | Status: DC
  Filled 2016-06-26 (×2): qty 250

## 2016-06-26 MED ORDER — TRANEXAMIC ACID (OHS) BOLUS VIA INFUSION
15.0000 mg/kg | INTRAVENOUS | Status: AC
  Administered 2016-06-27: 918 mg via INTRAVENOUS
  Filled 2016-06-26: qty 918

## 2016-06-26 MED ORDER — MAGNESIUM SULFATE 50 % IJ SOLN
40.0000 meq | INTRAMUSCULAR | Status: DC
  Filled 2016-06-26 (×2): qty 10

## 2016-06-26 MED ORDER — TRANEXAMIC ACID 1000 MG/10ML IV SOLN
1.5000 mg/kg/h | INTRAVENOUS | Status: AC
  Administered 2016-06-27: 1.5 mg/kg/h via INTRAVENOUS
  Filled 2016-06-26 (×2): qty 25

## 2016-06-26 MED ORDER — LEVOFLOXACIN IN D5W 500 MG/100ML IV SOLN
500.0000 mg | INTRAVENOUS | Status: AC
  Administered 2016-06-27: 500 mg via INTRAVENOUS
  Filled 2016-06-26 (×2): qty 100

## 2016-06-26 MED ORDER — EPINEPHRINE PF 1 MG/ML IJ SOLN
0.0000 ug/min | INTRAMUSCULAR | Status: DC
  Filled 2016-06-26 (×2): qty 4

## 2016-06-26 MED ORDER — POTASSIUM CHLORIDE 2 MEQ/ML IV SOLN
80.0000 meq | INTRAVENOUS | Status: DC
  Filled 2016-06-26 (×2): qty 40

## 2016-06-26 MED ORDER — SODIUM CHLORIDE 0.9 % IV SOLN
INTRAVENOUS | Status: DC
  Filled 2016-06-26 (×2): qty 30

## 2016-06-26 MED ORDER — PLASMA-LYTE 148 IV SOLN
INTRAVENOUS | Status: AC
  Administered 2016-06-27: 500 mL
  Filled 2016-06-26 (×2): qty 2.5

## 2016-06-26 NOTE — Telephone Encounter (Signed)
lmtcb x3 for pt. 

## 2016-06-26 NOTE — Anesthesia Preprocedure Evaluation (Addendum)
Anesthesia Evaluation  Patient identified by MRN, date of birth, ID band Patient awake    Reviewed: Allergy & Precautions, H&P , NPO status , Patient's Chart, lab work & pertinent test results  History of Anesthesia Complications (+) PONV  Airway Mallampati: II  TM Distance: >3 FB Neck ROM: Full    Dental no notable dental hx. (+) Teeth Intact, Dental Advisory Given   Pulmonary shortness of breath, asthma , Current Smoker,  H/o lung CA s/p wedge resection   Pulmonary exam normal breath sounds clear to auscultation       Cardiovascular Exercise Tolerance: Good + Peripheral Vascular Disease and +CHF  + Valvular Problems/Murmurs MR  Rhythm:Regular Rate:Normal     Neuro/Psych negative neurological ROS  negative psych ROS   GI/Hepatic negative GI ROS, Neg liver ROS,   Endo/Other  Hypothyroidism   Renal/GU negative Renal ROS  negative genitourinary   Musculoskeletal   Abdominal   Peds  Hematology negative hematology ROS (+) anemia ,   Anesthesia Other Findings   Reproductive/Obstetrics negative OB ROS                            Anesthesia Physical Anesthesia Plan  ASA: IV  Anesthesia Plan: General   Post-op Pain Management:    Induction: Intravenous  Airway Management Planned: Double Lumen EBT  Additional Equipment: Arterial line, CVP, PA Cath, TEE, 3D TEE and Ultrasound Guidance Line Placement  Intra-op Plan:   Post-operative Plan: Post-operative intubation/ventilation  Informed Consent: I have reviewed the patients History and Physical, chart, labs and discussed the procedure including the risks, benefits and alternatives for the proposed anesthesia with the patient or authorized representative who has indicated his/her understanding and acceptance.   Dental advisory given  Plan Discussed with: CRNA  Anesthesia Plan Comments:         Anesthesia Quick Evaluation

## 2016-06-26 NOTE — H&P (Signed)
[address]       [place] [address]             [phone]          CARDIOTHORACIC SURGERY HISTORY AND PHYSICAL EXAM  Referring Provider is Dorothy Spark, MD PCP is Velna Hatchet, MD      Chief Complaint  Patient presents with  . 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 The patient states that she has been known to have mitral valve prolapse for most of her adult life. She has several other close family members with mitral valve prolapse and her mother underwent mitral valve replacement 2 in the past. For many years she was followed by a cardiologist in [place]. She retired from the [employer] in November 2015 and moved to [place] to be near her family. The patient describes a long history of intermittent dizzy spells and ultimately sought to establish care with a cardiologist locally in [place]. She was initially evaluated by Dr. Meda Coffee in June 2016. Transthoracic echocardiogram performed at that time revealed normal left ventricular systolic function with severe bileaflet prolapse and what was felt to be mild to moderate mitral regurgitation.  At approximately the same time she was diagnosed with lung cancer and initially was evaluated locally in [place] but ultimately underwent surgical resection at [facility]. She was found to have synchronous primary tumors in the  left lung, each of which were treated with wedge resection. She underwent a total of 4 cycles of adjuvant chemotherapy postoperatively. Her most recent follow-up chest CT scan was performed in March 2017 at which time there was no sign of locally recurrent disease. She was seen in follow-up recently by Dr. Elenor Quinones at [facility] who plans to see the patient again in approximately 6 months with a repeat chest CT scan at that time.  More recently the patient returned to Dr. Francesca Oman office for follow-up with complaints of intermittent dizzy spells. The patient notes that her blood pressure has always run somewhat on the low side.  Over the last few months she has developed worsening exertional shortness of breath.  She was seen in follow-up recently by Dr. Meda Coffee and transthoracic echocardiogram revealed severe mitral regurgitation. Left ventricular systolic function remained normal with ejection fraction estimated 65-70%. There was mild pulmonary hypertension, severe left atrial enlargement, and mild tricuspid regurgitation.  Transesophageal echocardiogram was performed 02/22/2016. This confirmed the presence of bileaflet prolapse of the mitral valve with severe mitral regurgitation. ERO was measured 0.44 cm corresponding to an estimated regurgitant volume of 93 mL.  There was flow reversal in the pulmonary veins. Left ventricular systolic function appeared normal with ejection fraction estimated 60-65%. There was severe left atrial enlargement. There was prolapse of the posterior leaflet of the tricuspid valve with moderate tricuspid regurgitation. Pulmonary artery pressures were felt to be moderately increased.  The patient was referred for surgical consultation.  The patient is single and lives locally in [place], [place].  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 She retired in 2015. She remains entirely functionally independent. She does not exercise on a regular  basis. She admits to a long history of exertional shortness of breath which she states has progressed somewhat over the last few months. She does not get short of breath with ordinary activities but only if she does something more strenuous. She denies any history of resting shortness breath, PND, orthopnea, or lower extremity edema. She denies any palpitations. She has frequent dizzy spells with no history of syncope. She denies any history of exertional chest pain or chest tightness.  She states that she quit smoking at the time she had surgery for lung cancer, but she resumed smoking over the past year, currently smoking 4 or 5 cigarettes per day.  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 The patient does have Raynaud's disease but none of the other associated findings. 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 Last year she underwent Botox injection of her vocal cord with successful restoration of her previous normal voice.  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 She was last seen here in our office on 05/27/2016. We had originally planned for surgery in January but this was postponed because the patient had come down with an upper respiratory tract infection. Surgery was rescheduled for 06/27/2016. The patient states that she feels much better and her flulike symptoms completely resolved  within 2 weeks ago. She quit smoking and has been exercising on a regular basis. She still reports some exertional shortness of breath, but this only occurs with more strenuous physical activity. Overall she feels well and she is looking forward to getting the surgery put behind her. The remainder of her review of systems is unchanged from previously.    Past Medical History:  Diagnosis Date  . Allergy   . Asthma    pt denies this, but is on Dulera  . 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 Family history of adverse reaction to anesthesia    Mother- very sensitive to medication  . Heart murmur    MVP  . Hypothyroidism   . Lung cancer ([place]) 12/30/2014   Synchronous primaries:  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, each treated by wedge resection with post-op adjuvant [facility] at [facility]  . MVP (mitral valve prolapse)   . PONV (postoperative nausea and vomiting)   . Raynaud's disease    Raynaud's  . Severe mitral regurgitation 11/15/2014  . Shortness of breath dyspnea    with exertion  . Telangiectasia   . Thyroid disease     Past Surgical History:  Procedure Laterality Date  . BACK SURGERY     x 3  Disectomy  . CARDIAC CATHETERIZATION N/A 03/08/2016   Procedure: Right/Left Heart Cath and Coronary Angiography;  Surgeon: Sherren Mocha, MD;  Location: [facility];  Service: Cardiovascular;  Laterality: N/A;  . CLAVICLE SURGERY Left 2013   plate to left collar bone  . LAPAROSCOPY     ? reason-age 78   . TEE WITHOUT CARDIOVERSION N/A 02/22/2016   Procedure: TRANSESOPHAGEAL ECHOCARDIOGRAM (TEE);  Surgeon: Dorothy Spark, MD;  Location: [facility];  Service: Cardiovascular;  Laterality: N/A;  . TONSILLECTOMY    . VIDEO ASSISTED THORACOSCOPY (VATS)/WEDGE RESECTION Left 12/30/2014   Bronchoscopy, Mediastinoscopy, Left VATS for Wedge resection LUL x2 adn LLL x1 - Dr. Elenor Quinones at [facility]  . wrist surgery  Left 2015   plate to wrist     Family History  Problem Relation Age of Onset  . Mitral valve prolapse Mother   . Dementia Father   . Prostate cancer Father   . Mitral valve prolapse Brother   . Mitral valve prolapse Sister   . Colon cancer Neg Hx   . Colon polyps Neg Hx   . Rectal cancer Neg Hx   . Esophageal cancer Neg Hx   . Stomach cancer Neg Hx   . Heart attack Neg Hx     Social History Social History  Substance Use Topics  . Smoking status: Current Every Day Smoker    Packs/day: 0.00    Years: 24.00    Types: E-cigarettes  . Smokeless tobacco: Never Used     Comment: states she stopped cigarettes 2 weeks ago (as 06/24/16), using vapes  . Alcohol use 0.6 oz/week    1 Shots of liquor per week     Comment: socially    Prior to Admission medications   Medication Sig Start Date End Date Taking? Authorizing Provider  aspirin EC 81 MG tablet Take 81 mg by mouth daily.   Yes Historical Provider, MD  COCONUT OIL PO Take 15 mLs by mouth See admin instructions. Daily with coffee   Yes Historical Provider, MD  DULERA 100-5 MCG/ACT AERO INHALE 2 PUFFS BY MOUTH FIRST THING IN THE MORNING AND 2 PUFFS 12 HOURS LATER 06/17/16  Yes Tanda Rockers, MD  levothyroxine (SYNTHROID, LEVOTHROID) 175 MCG tablet Take 175 mcg by mouth daily before breakfast.   Yes Historical Provider, MD  Multiple Vitamin (MULTIVITAMIN) LIQD Take 30 mLs by mouth daily. VM-100 COMPLETE MULTIVITAMIN   Yes Historical Provider, MD  naproxen sodium (ANAPROX) 220 MG tablet Take 220 mg by mouth 2 (two) times daily as needed (FOR BACK PAIN/ACHES.).    Yes Historical Provider, MD  Omega-3 Fatty Acids (FISH OIL) 1200 MG CAPS Take 1,200 mg by mouth daily.   Yes Historical Provider, MD    Allergies  Allergen Reactions  . Penicillins Hives    Unknown, occurred as a child Has patient had a PCN reaction causing immediate rash, facial/tongue/throat swelling, SOB or lightheadedness with hypotension: Yes Has patient had a PCN  reaction causing severe rash involving mucus membranes or skin necrosis: No Has patient had a PCN reaction that required hospitalization  Has patient had a PCN reaction occurring within the last 10 years: No If all of the above answers are "NO", then may proceed with Cephalosporin use. No     Review of Systems:              General:                      normal appetite, decreased energy, no weight gain, no weight loss, no fever  Cardiac:                       no chest pain with exertion, no chest pain at rest, + SOB with exertion, no resting SOB, no PND, no orthopnea, no palpitations, no arrhythmia, no atrial fibrillation, no LE edema, + dizzy spells, no syncope             Respiratory:                 + exertional shortness of breath, no home oxygen, no productive cough, + chronic dry cough that is typically worse in the mornings, intermittent bronchitis, no wheezing, no hemoptysis, no asthma, no pain with inspiration or cough, no sleep apnea, no CPAP at night             GI:                               no difficulty swallowing, no reflux, no frequent heartburn, no hiatal hernia, no abdominal pain, no constipation, no diarrhea, no hematochezia, no hematemesis, no melena             GU:                              no dysuria,  no frequency, no urinary tract infection, no hematuria, no kidney stones, no kidney disease             Vascular:                     no pain suggestive of claudication, no pain in feet, + occasional leg cramps, no varicose veins, no DVT, no non-healing foot ulcer             Neuro:                         no stroke, no TIA's, no seizures, no headaches, no temporary blindness one eye,  no slurred speech, no peripheral neuropathy, no chronic pain, no instability of gait, no memory/cognitive dysfunction             Musculoskeletal:         no arthritis, no joint swelling, no myalgias, no difficulty walking, normal mobility              Skin:                             no rash, no itching, no skin infections, no pressure sores or ulcerations             Psych:                         no anxiety, no depression, no nervousness, no unusual recent stress             Eyes:                           no blurry vision, no floaters, no recent vision changes, + wears glasses or contacts             ENT:                            +  hearing loss, no loose or painful teeth, no dentures, last saw dentist within the past year             Hematologic:               + easy bruising, no abnormal bleeding, no clotting disorder, no frequent epistaxis             Endocrine:                   no diabetes, does not check CBG's at home                           Physical Exam:              BP (!) 102/51   Pulse 77   Resp 16   Ht '5\' 7"'$  (1.702 m)   Wt 135 lb (61.2 kg)   SpO2 98% Comment: ON RA  BMI 21.14 kg/m              General:                      Thin, well-appearing             HEENT:                       Unremarkable              Neck:                           no JVD, no bruits, no adenopathy              Chest:                          clear to auscultation, symmetrical breath sounds, no wheezes, no rhonchi              CV:                              RRR, grade III/VI systolic murmur              Abdomen:                    soft, non-tender, no masses              Extremities:                 warm, well-perfused, pulses diminished but palpable, no LE edema             Rectal/GU                   Deferred             Neuro:                         Grossly non-focal and symmetrical throughout             Skin:                            Clean and dry, no rashes, no breakdown   Diagnostic Tests:  Transthoracic Echocardiography  Patient: Joy, Williams MR #: [id number] Study Date:  02/14/2016 Gender: F Age: 24 Height: 170.2 cm Weight: 61.1 kg BSA: 1.7 m^2 Pt. Status: Room:  ATTENDING Jenkins Rouge,  M.D. SONOGRAPHER Santiago, RDCS ORDERING Ena Dawley, M.D. REFERRING Ena Dawley, M.D. PERFORMING [facility] Outpatient  cc:  ------------------------------------------------------------------- LV EF: 65% - 70%  ------------------------------------------------------------------- Indications: Mitral regurgitation (I34).  ------------------------------------------------------------------- History: Risk factors: Scleroderma. Current tobacco use.  ------------------------------------------------------------------- Study Conclusions  - Left ventricle: The cavity size was normal. There was moderate concentric hypertrophy. Systolic function was vigorous. The estimated ejection fraction was in the range of 65% to 70%. Wall motion was normal; there were no regional wall motion abnormalities. Features are consistent with a pseudonormal left ventricular filling pattern, with concomitant abnormal relaxation and increased filling pressure (grade 2 diastolic dysfunction). Doppler parameters are consistent with elevated ventricular end-diastolic filling pressure. - Aortic valve: Trileaflet; normal thickness leaflets. There was no regurgitation. - Aortic root: The aortic root was normal in size. 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 The findings are consistent with mild stenosis. There was severe regurgitation. - Left atrium: The atrium was severely dilated. - Right ventricle: Systolic function was normal. - Tricuspid valve: There was mild regurgitation. - Pulmonic valve: There was trivial regurgitation. - Pulmonary arteries: Systolic pressure was mildly increased. PA peak pressure: 37 mm Hg (S).  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 Mitral regurgitation is most probably severe, further  evaluation with a TEE is recommended.  ------------------------------------------------------------------- Labs, prior tests, procedures, and surgery: Transthoracic echocardiography (11/04/2014). EF was 60%.  ------------------------------------------------------------------- Study data: Comparison was made to the study of 11/04/2014. Study status: Routine. Procedure: The patient reported no pain pre or post test. Transthoracic echocardiography. Image quality was adequate. Study completion: There were no complications. Transthoracic echocardiography. M-mode, complete 2D, spectral Doppler, and color Doppler. Birthdate: Patient birthdate: 1950-06-30. Age: Patient is 66 yr old. Sex: Gender: female. BMI: 21.1 kg/m^2. Blood pressure: 100/70 Patient status: Outpatient. Study date: Study date: 02/14/2016. Study time: 07:43 AM. Location: [facility]  -------------------------------------------------------------------  ------------------------------------------------------------------- Left ventricle: The cavity size was normal. There was moderate concentric hypertrophy. Systolic function was vigorous. The estimated ejection fraction was in the range of 65% to 70%. Wall motion was normal; there were no regional wall motion abnormalities. Features are consistent with a pseudonormal left ventricular filling pattern, with concomitant abnormal relaxation and increased filling pressure (grade 2 diastolic dysfunction). Doppler parameters are consistent with elevated ventricular end-diastolic filling pressure.  ------------------------------------------------------------------- Aortic valve: Trileaflet; normal thickness leaflets. Mobility was not restricted. Doppler: Transvalvular velocity was within the normal range. There was no stenosis. There was no regurgitation.  ------------------------------------------------------------------- Aorta: Aortic  root: The aortic root was normal in size.  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 Mobility was not restricted. Doppler: The findings are consistent with mild stenosis. There was severe regurgitation. Valve area by pressure half-time: 2.29 cm^2. Indexed valve area by pressure half-time: 1.35 cm^2/m^2. Valve area by continuity equation (using LVOT flow): 1.88 cm^2. Indexed valve area by continuity equation (using LVOT flow): 1.11 cm^2/m^2. Mean gradient (D): 4 mm Hg. Peak gradient (D): 10 mm Hg.  ------------------------------------------------------------------- Left atrium: The atrium was severely dilated.  ------------------------------------------------------------------- Right ventricle: The cavity size was normal. Wall thickness was normal. Systolic function was normal.  ------------------------------------------------------------------- Pulmonic valve: Structurally normal valve. Cusp separation was normal. Doppler: Transvalvular velocity was within the normal range. There was  no evidence for stenosis. There was trivial regurgitation.  ------------------------------------------------------------------- Tricuspid valve: Structurally normal valve. Doppler: Transvalvular velocity was within the normal range. There was mild regurgitation.  ------------------------------------------------------------------- Pulmonary artery: The main pulmonary artery was normal-sized. Systolic pressure was mildly increased.  ------------------------------------------------------------------- Right atrium: The atrium was normal in size.  ------------------------------------------------------------------- Pericardium: There was no pericardial effusion.  ------------------------------------------------------------------- Systemic  veins: Inferior vena cava: The vessel was normal in size.  ------------------------------------------------------------------- Measurements  Left ventricle Value Reference LV ID, ED, PLAX chordal (L) 34.4 mm 43 - 52 LV ID, ES, PLAX chordal 24.3 mm 23 - 38 LV fx shortening, PLAX chordal 29 % >=29 LV PW thickness, ED 14.4 mm --------- IVS/LV PW ratio, ED 0.77 <=1.3 Stroke volume, 2D 75 ml --------- Stroke volume/bsa, 2D 44 ml/m^2 --------- LV e&', lateral 4.5 cm/s --------- LV E/e&', lateral 35.11 --------- LV e&', medial 6.08 cm/s --------- LV E/e&', medial 25.99 --------- LV e&', average 5.29 cm/s --------- LV E/e&', average 29.87 ---------  Ventricular septum Value Reference IVS thickness, ED 11.1 mm ---------  LVOT Value Reference LVOT ID, S 21 mm --------- LVOT area 3.46 cm^2 --------- LVOT peak velocity, S 120 cm/s --------- LVOT mean velocity, S 71.8 cm/s --------- LVOT VTI, S 21.7 cm --------- LVOT peak gradient, S 6 mm Hg ---------  Aorta Value Reference Aortic root ID, ED 30 mm ---------  Left atrium Value  Reference LA ID, A-P, ES 40 mm --------- LA ID/bsa, A-P (H) 2.36 cm/m^2 <=2.2 LA volume, S 143 ml --------- LA volume/bsa, S 84.2 ml/m^2 --------- LA volume, ES, 1-p A4C 136 ml --------- LA volume/bsa, ES, 1-p A4C 80.1 ml/m^2 --------- LA volume, ES, 1-p A2C 141 ml --------- LA volume/bsa, ES, 1-p A2C 83 ml/m^2 ---------  Mitral valve Value Reference Mitral E-wave peak velocity 158 cm/s --------- Mitral A-wave peak velocity 110 cm/s --------- Mitral mean velocity, D 94.6 cm/s --------- Mitral deceleration time (H) 289 ms 150 - 230 Mitral pressure half-time 96 ms --------- Mitral mean gradient, D 4 mm Hg --------- Mitral peak gradient, D 10 mm Hg --------- Mitral E/A ratio, peak 1.4 --------- Mitral valve area, PHT, DP 2.29 cm^2 --------- Mitral valve area/bsa, PHT, DP 1.35 cm^2/m^2 --------- Mitral valve area, LVOT 1.88 cm^2 --------- continuity Mitral valve area/bsa, LVOT 1.11 cm^2/m^2 --------- continuity Mitral annulus VTI, D 39.9 cm --------- Mitral regurg VTI, PISA 187 cm --------- Mitral ERO, PISA 0.55 cm^2 --------- Mitral regurg volume, PISA 103 ml ---------  Pulmonary arteries Value Reference PA pressure, S, DP (H) 37 mm Hg <=30  Tricuspid valve  Value Reference Tricuspid regurg peak velocity 269 cm/s --------- Tricuspid peak RV-RA gradient 29 mm Hg ---------  Systemic veins Value Reference Estimated CVP 8 mm Hg ---------  Right ventricle Value Reference RV pressure, S, DP (H) 37 mm Hg <=30 RV s&', lateral, S 13.2 cm/s ---------  Pulmonic valve Value Reference Pulmonic regurg velocity, ED 121 cm/s --------- Pulmonic regurg gradient, ED 6 mm Hg ---------  Legend: (L) and (H) mark values outside specified reference range.  ------------------------------------------------------------------- Prepared and Electronically Authenticated by  Ena Dawley, M.D. 2017-09-27T08:47:01   Transesophageal Echocardiography  Patient: Joy, Williams MR #: [id number] Study Date: 02/22/2016 Gender: F Age: 2 Height: 170.2 cm Weight: 61.2 kg BSA: 1.7 m^2 Pt. Status: Room:  ADMITTING Ena Dawley, M.D. ATTENDING Ena Dawley, M.D. ORDERING Ena Dawley, M.D. PERFORMING Ena Dawley, M.D. REFERRING Ena Dawley, M.D. SONOGRAPHER Roseanna Rainbow  cc:  ------------------------------------------------------------------- LV EF: 60% - 65%  ------------------------------------------------------------------- Indications: Mitral valve prolapse 424.0.  -------------------------------------------------------------------  History: PMH: Scleroderma. Severe mitral regurgitation. Risk factors: Current tobacco use.  ------------------------------------------------------------------- Study Conclusions  - Left ventricle: There was  concentric hypertrophy. Systolic function was normal. The estimated ejection fraction was in the range of 60% to 65%. Wall motion was normal; there were no regional wall motion abnormalities. 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 PISA is 1.2 and regurgitant volume 93 mL. There was severe regurgitation. - Left atrium: The atrium was severely dilated. No evidence of thrombus in the atrial cavity or appendage. No evidence of thrombus in the atrial cavity or appendage. - Right atrium: The atrium was dilated. No evidence of thrombus in the atrial cavity or appendage. - Atrial septum: No defect or patent foramen ovale was identified. 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 There was moderate regurgitation. - Pulmonary arteries: Systolic pressure was moderately increased. PA peak pressure: 46 mm Hg (S).  Impressions:  - Myxomatous changes associated with severe prolapse and severe mitral regurgitation. 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 There is moderate pulmonary hypertension. 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  ------------------------------------------------------------------- Study data: Study status: Routine. Consent: The risks, benefits, and alternatives to the procedure were explained to the patient and informed consent was obtained. Procedure: Initial setup. The patient was brought to the laboratory. Surface ECG leads were monitored.  Sedation. Conscious sedation was administered by cardiology staff. Transesophageal echocardiography. An adult multiplane transesophageal probe was inserted by the attending cardiologistwithout difficulty. Image quality was adequate. Study completion: The patient tolerated the procedure well. There were no complications. Administered medications: Fentanyl, 58mg, IV. Midazolam, '4mg'$ , IV. Diagnostic transesophageal echocardiography. 2D and color Doppler. Birthdate: Patient birthdate: [id number] Age: Patient is 66yr old. Sex: Gender: female. BMI: 21.1 kg/m^2. Blood pressure: 102/45 Patient status: Inpatient. Study date: Study date: 02/22/2016. Study time: 12:54 PM. Location: Endoscopy.  -------------------------------------------------------------------  ------------------------------------------------------------------- Left ventricle: There was concentric hypertrophy. Systolic function was normal. The estimated ejection fraction was in the range of 60% to 65%. Wall motion was normal; there were no regional wall motion abnormalities.  ------------------------------------------------------------------- Aortic valve: Structurally normal valve. Trileaflet; normal thickness leaflets. Cusp separation was normal. Doppler: There was no significant regurgitation.  ------------------------------------------------------------------- Aorta: There was no atheroma. There was no evidence for dissection. Aortic root: The aortic root was not dilated. Ascending aorta: The ascending aorta was normal in size. Descending aorta: The descending aorta was normal in size. There was mild non-mobie plaque.  ------------------------------------------------------------------- Mitral valve: Severely thickened myxomatous mitral valve involving both of the leaflet with severe prolapse and severe mitral regurgitation that is hitting the posterior portion of the  left atrium and swirling back with clear systolic reversible flow in pulmonary veins forward flow. PISA is 1.2 and regurgitant volume 93 mL. Structurally normal valve. Leaflet separation was normal. Doppler: There was severe regurgitation.  ------------------------------------------------------------------- Left atrium: The atrium was severely dilated. No evidence of thrombus in the atrial cavity or appendage. No evidence of thrombus in the atrial cavity or appendage. The appendage was morphologically a left appendage, multilobulated, and of normal size. Emptying velocity was normal.  ------------------------------------------------------------------- Atrial septum: No defect or patent foramen ovale was identified.  -------------------------------------------------------------------  Right ventricle: The cavity size was normal. Wall thickness was normal. Systolic function was normal.  ------------------------------------------------------------------- Pulmonic valve: Structurally normal valve. Doppler: There was mild regurgitation.  ------------------------------------------------------------------- Tricuspid valve: There is also myxomatous degeneration and prolapse of the posterior tricuspid valve leaflet associated with multiple small jets collectively causing moderate regurgitation. Structurally normal valve. Leaflet separation was normal. Doppler: There was moderate regurgitation.  ------------------------------------------------------------------- Pulmonary artery: The main pulmonary artery was mildly dilated. Systolic pressure was moderately increased.  ------------------------------------------------------------------- Right atrium: The atrium was dilated. No evidence of thrombus in the atrial cavity or appendage. The appendage was morphologically a right  appendage.  ------------------------------------------------------------------- Pericardium: The pericardium was normal in appearance. There was no pericardial effusion.  ------------------------------------------------------------------- Measurements  Mitral valve Value Reference Mitral regurg VTI, PISA 211 cm --------- Mitral ERO, PISA 0.44 cm^2 --------- Mitral regurg volume, PISA 93 ml ---------  Pulmonary arteries Value Reference PA pressure, S, DP (H) 46 mm Hg <=30  Tricuspid valve Value Reference Tricuspid regurg peak velocity 327 cm/s --------- Tricuspid peak RV-RA gradient 43 mm Hg ---------  Legend: (L) and (H) mark values outside specified reference range.  ------------------------------------------------------------------- Prepared and Electronically Authenticated by  Ena Dawley, M.D. 2017-10-05T16:42:29   [place]: 6.7 mCi F-18 FDG was injected intravenously. Full-ring PET imaging was performed from the skull base to thigh after the radiotracer. CT data was obtained and used for attenuation correction and anatomic localization.  FASTING BLOOD GLUCOSE: Value: Ninety-one mg/dl  COMPARISON: None.  FINDINGS: NECK  No hypermetabolic lymph nodes in the neck. Symmetric tongue base and palate activity thought to be physiologic. Activity in the anterior tongue likewise thought to be physiologic. Focally high activity along the right side of the pharyngo esophageal junction and posterior to the glottis without CT correlate, maximum standard uptake value 6.9, no previous hypermetabolic activity in this vicinity.  CHEST  Interval resections of the left upper lobe and lower lobe masses with wedge  resections staple lines, there is some indistinct residual airspace opacity in the left lower lobe with maximum standard uptake value about 3.6, but without a discrete focal lesion identified in this vicinity. In the left infrahilar region below the mainstem bronchus, and corresponding to a 7 mm in short axis lymph node, there is faintly hypermetabolic activity with maximum SUV 4.1, new from the prior exam in the left AP window there is faint hypermetabolic activity with maximum SUV 3.7; background mediastinal blood pool activity is 2.7. A right lower paratracheal node anterior to the carina measuring about 9 mm in short axis has a maximum standard uptake value of 3.3, borderline high. Right infrahilar lymph node with maximum SUV 3.7.  Anteriorly in the right middle lobe, a 4 mm subpleural nodule on image 52 series 8 does not demonstrate hypermetabolic activity but is below sensitive PET-CT size thresholds. Also on the right middle lobe there is a stable 4 mm nodule on image 42/8, without high metabolic activity. Both unchanged from 11/16/2014.  Mild cardiomegaly. Dense mitral annular calcification. Atherosclerotic calcification of the aortic arch.  ABDOMEN/PELVIS  No abnormal hypermetabolic activity within the liver, pancreas, adrenal glands, or spleen. No hypermetabolic lymph nodes in the abdomen or pelvis.  Aortoiliac atherosclerotic vascular disease. Sigmoid colon diverticulosis. Likely physiologic activity of the anal sphincter. Faint rim calcification along a cystic lesion in the left lower quadrant, this lesion is photopenic on PET imaging.  SKELETON  No focal hypermetabolic activity to suggest skeletal metastasis. Left clavicular plate and  screw fixator.  IMPRESSION: 1. Interval wedge resections in the left upper lobe and left lower lobe with removal of the prior masses. 2. Faintly accentuated metabolic activity in multiple bilateral lymph nodes in the chest.  These could conceivably be reactive rather than malignant given the low-grade activity. 3. Several small right-sided lung nodules are unchanged from 11/16/2014, and below sensitive PET-CT size thresholds. Surveillance may be warranted. 4. New focal high-grade activity along the right side of the pharyngo esophageal junction without a definite CT correlate, probably physiologic, maximum SUV 6.9, area merits surveillance. This did as at the level of the glottis but appears to be posterior to the glottis on the right. 5. Other imaging findings of potential clinical significance: Indistinct airspace opacity in the left lower lobe with low-grade activity, likely inflammatory. Pneumonia not excluded. Mild cardiomegaly. Dense mitral annular calcification. Atherosclerotic abdominal aorta and aortic arch. Sigmoid colon diverticulosis. 6. Rim calcified left lower quadrant cystic lesion is photopenic and likely benign.   Electronically Signed By: Van Clines M.D. On: 03/12/2016 11:49   Right/Left Heart Cath and Coronary Angiography  Conclusion   1. Angiographically normal coronary arteries 2. Normal intracardiac pressures in both the right and left heart chambers with preserved cardiac output 3. Severely calcified mitral annulus with known bileaflet mitral valve prolapse and severe mitral regurgitation  Indications   Severe mitral regurgitation [I34.0 (ICD-10-CM)]  Procedural Details/Technique   Technical Details INDICATION: Severe mitral regurgitation (Barlow's). Preoperative cardiac surgical study.  PROCEDURAL DETAILS: There was an indwelling IV in a right antecubital vein. Using normal sterile technique, the IV was changed out for a 5 Fr brachial sheath over a 0.018 inch wire. The right wrist was then prepped, draped, and anesthetized with 1% lidocaine. Using the modified Seldinger technique a 5/6 French Slender sheath was placed in the right radial artery.  Intra-arterial verapamil was administered through the radial artery sheath. IV heparin was administered after a JR4 catheter was advanced into the central aorta. A Swan-Ganz catheter was used for the right heart catheterization. I was not able to advance the Swan-Ganz catheter into the pulmonary artery because of pain in the patient's arm limiting catheter manipulation. I changed out the Swan-Ganz catheter for a 5 [place] purpose and was able to record pulmonary artery and pulmonary capillary wedge pressures with this. Standard protocol was followed for recording of right heart pressures and sampling of oxygen saturations. Fick cardiac output was calculated. Standard Judkins catheters were used for selective coronary angiography. The JR4 catheter was used to record left ventricular pressure. There was an attempt made to steer the Chase catheter with the wire into the descending thoracic aorta for an abdominal aortogram but this was unsuccessful because of the angle of the innominate artery into the aortic arch. There were no immediate procedural complications. The patient was transferred to the post catheterization recovery area for further monitoring.     Estimated blood loss <50 mL.  During this procedure the patient was administered the following to achieve and maintain moderate conscious sedation: Versed 2 mg, Fentanyl 50 mcg, while the patient's heart rate, blood pressure, and oxygen saturation were continuously monitored. The period of conscious sedation was 39 minutes, of which I was present face-to-face 100% of this time.    Coronary Findings   Dominance: Right  Left Anterior Descending  Vessel is angiographically normal. The LAD is a large vessel with no obstruction. The vessel wraps around the LV apex. The first diagonal is a large vessel with no disease noted.  Vessel is angiographically normal.  Left Circumflex  Vessel is angiographically normal. The circumflex is widely patent and smooth  throughout. There is no obstructive disease. The first OM is tiny. The second OM supplies twin branches to the posterolateral wall.  Right Coronary Artery  Vessel is angiographically normal. Large, dominant vessel. The vessel is smooth throughout with no evidence of atherosclerotic disease. The PDA and PLA branches are patent.  Right Heart   Right Heart Pressures LV EDP is normal.    Left Heart   Mitral Valve The annulus is calcified.    Coronary Diagrams   Diagnostic Diagram     Implants        No implant documentation for this case.  PACS Images   Show images for Cardiac catheterization   Link to Procedure Log   Procedure Log    Hemo Data   Flowsheet Row Most Recent Value  Fick Cardiac Output 5.07 L/min  Fick Cardiac Output Index 2.96 (L/min)/BSA  RA A Wave 3 mmHg  RA V Wave 1 mmHg  RA Mean 0 mmHg  RV Systolic Pressure 23 mmHg  RV Diastolic Pressure 0 mmHg  RV EDP 2 mmHg  PA Systolic Pressure 28 mmHg  PA Diastolic Pressure 7 mmHg  PA Mean 17 mmHg  PW A Wave 10 mmHg  PW V Wave 10 mmHg  PW Mean 8 mmHg  AO Systolic Pressure 017 mmHg  AO Diastolic Pressure 57 mmHg  AO Mean 76 mmHg  LV Systolic Pressure 510 mmHg  LV Diastolic Pressure 0 mmHg  LV EDP 9 mmHg  Arterial Occlusion Pressure Extended Systolic Pressure 258 mmHg  Arterial Occlusion Pressure Extended Diastolic Pressure 55 mmHg  Arterial Occlusion Pressure Extended Mean Pressure 78 mmHg  Left Ventricular Apex Extended Systolic Pressure 527 mmHg  Left Ventricular Apex Extended Diastolic Pressure 0 mmHg  Left Ventricular Apex Extended EDP Pressure 11 mmHg  QP/QS 1  TPVR Index 5.74 HRUI  TSVR Index 25.65 HRUI  TPVR/TSVR Ratio 0.22     CT ANGIOGRAPHY CHEST, ABDOMEN AND PELVIS  TECHNIQUE: Multidetector CT imaging through the chest, abdomen and pelvis was performed using the standard protocol during bolus administration of intravenous contrast. Multiplanar reconstructed images and MIPs  were obtained and reviewed to evaluate the vascular anatomy.  CONTRAST: 75 mL of Isovue 370 intravenous contrast  COMPARISON: Chest CT, 11/16/2014. PET-CT, 03/12/2016  FINDINGS: CTA CHEST FINDINGS  Cardiovascular: Thoracic aorta is normal in caliber. Ascending aorta measures 3.2 cm in greatest diameter. Mild atherosclerotic, noncalcified plaque noted along the arch and descending thoracic aorta. No aortic dissection. Branch vessels are widely patent. Eccentric calcified plaque noted at the origin of the right subclavian artery.  Heart is normal in overall size. There is left atrial dilation. Dense calcification lies along the mitral valve annulus.  No coronary artery calcifications.  Mediastinum/Nodes: No neck base, axillary, mediastinal or hilar masses or pathologically enlarged lymph nodes.  Lungs/Pleura: Since the prior chest CT, wedge resections have been performed removing the left upper and left lower lobe pulmonary nodules. There is associated postsurgical scarring. There are no new lung nodules. Small previously described lung nodules are stable. Peripheral reticular opacities consistent with interstitial fibrosis is unchanged. No evidence of pneumonia or pulmonary edema. No pleural effusion or pneumothorax.  Review of the MIP images confirms the above findings.  CTA ABDOMEN AND PELVIS FINDINGS  VASCULAR  Aorta: Abdominal aorta is normal in caliber. No dissection. There is mild partly calcified atherosclerotic plaque throughout the abdominal aorta with no  significant stenosis. This extends into the common, this extends into the common iliac arteries and internal iliac arteries. The external iliac arteries are widely patent with no significant plaque.  Celiac: Widely patent  SMA: Widely patent  Renals: Single bilateral renal arteries. There is significant calcified plaque at the origin of both renal arteries. Both renal arteries are  narrowed at their origins, 50-60%.  IMA: Patent.  Inflow: External iliac and common femoral arteries are widely patent. No evidence of significant inflow obstruction to the lower extremities.  Veins: On the delayed images, the inferior vena cava is patent. No evidence of thrombus.  Review of the MIP images confirms the above findings.  NON-VASCULAR  Hepatobiliary: No focal liver abnormality is seen. No gallstones, gallbladder wall thickening, or biliary dilatation.  Pancreas: Unremarkable. No pancreatic ductal dilatation or surrounding inflammatory changes.  Spleen: Normal in size without focal abnormality.  Adrenals/Urinary Tract: No adrenal masses. Tiny low-density lesion in the posterior midpole the left kidney, too small to characterize, likely a cyst. No other renal masses or lesions. No stones. No hydronephrosis. Normal ureters. Normal bladder.  Stomach/Bowel: Stomach and small bowel are unremarkable. Mild increased stool noted throughout the colon. There multiple sigmoid colon diverticula. No diverticulitis or other colon inflammation. Normal appendix visualized.  Lymphatic: No enlarged lymph nodes.  Reproductive: Uterus is unremarkable. There is an anterior left lower pelvis/adnexal cystic mass, oval and circumscribed, measuring 4 x 3.2 x 3.8 cm, stable from the prior PET-CT. No other adnexal abnormality.  Other: No abdominal wall hernia. No ascites.  Review of the MIP images confirms the above findings.  MUSCULOSKELETAL FINDINGS  Skeletal structures are demineralized. There are degenerative changes of the lumbar spine. No osteoblastic or osteolytic lesions.  IMPRESSION: 1. No thoracic aortic aneurysm. Ascending aorta measures a maximum diameter of 3.2 cm. No abdominal aortic aneurysm. 2. No thoracic or abdominal aortic dissection. 3. Dense mitral valve annular calcifications. Left atrial enlargement. Findings are consistent with mitral  valve prolapse and regurgitation. 4. Status post wedge resection of left upper and lower lobe lung nodules. Several sub cm residual lung nodules are stable from the prior PET-CT and chest CT. No new lung abnormalities. No acute lung findings. 5. Atherosclerotic changes along a normal caliber abdominal aorta. No aortic stenosis. 6. 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 No other significant aortic branch vessel stenosis. 7. No acute findings in the abdomen pelvis. 8. Sigmoid colon diverticula without diverticulitis. Stable 4 cm left adnexal cystic lesion. This is stable from the PET-CT. Mild increased stool noted throughout the colon.   Electronically Signed By: Lajean Manes M.D. On: 03/29/2016 17:20  Pulmonary Function Tests  Baseline                                                                      Post-bronchodilator  FVC                 2.67 L  (76% predicted)          FVC                 2.61 L  (75% predicted) FEV1               2.09 L  (  78% predicted)          FEV1               2.09 L  (78% predicted) FEF25-75        1.93 L  (85% predicted)          FEF25-75        1.94 L  (85% predicted)  TLC                 4.18 L  (77% predicted) RV                   1.58 L  (71% predicted) DLCO              53% predicted   Impression:  Patient has stage D severe symptomatic primary mitral regurgitation. She describes stable symptoms of exertional shortness of breath consistent with chronic diastolic congestive heart failure, [place] Heart Association functional class II.  The patient also has long-standing history of heavy tobacco abuse,mild COPD and history of lung cancer treated surgically without any evidence for recurrence. She has finally quit smoking.  I have personally reviewed her transthoracic and transesophageal echocardiograms, diagnostic cardiac catheterization, and CT angiograms. 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 There is severe thickening with excessive leaflet tissue involving both the anterior and the posterior leaflets. There is elongation of most if not all of the primary chordae tendineae to both leaflets. The jet of regurgitation is central and crosses the entire left atrium. There do not appear to be any ruptured chordae tendineaeor flail segments. Using PISA the ERO was measured 0.44 centimeters squared corresponding to a regurgitant volume estimated 93 mL. There was flow reversal in the pulmonary veins. The left atrium is severely dilated. Left ventricular size and systolic function appears normal.   Diagnostic cardiac catheterization is notable for the absence of significant coronary artery disease.  Pulmonary artery pressures were very mildly elevated.  PFTs reveal relatively mild obstructive disease with adequate total lung capacity and moderately reduced diffusion capacity.  CT angiography revealed no contraindications to peripheral arterial cannulation and a minimally invasive approach for surgery.    Plan:  The patient was again counseled at length regarding the indications, risks and potential benefits of mitral valve repair.  The rationale for elective surgery has been explained, including a comparison between surgery and continued medical therapy with close follow-up.  The likelihood of successful and durable valve repair has been discussed with particular reference to the findings of their recent echocardiogram.  Based upon these findings and previous experience, I have quoted her an 61 percent likelihood of successful valve repair with rate of repair somewhat reduced because of the significant calcification in the mitral valve annulus.  In the event that her valve cannot be successfully repaired, we discussed the possibility of replacing the mitral valve using a mechanical prosthesis with the attendant need for long-term  anticoagulation versus the alternative of replacing it using a bioprosthetic tissue valve with its potential for late structural valve deterioration and failure, depending upon the patient's longevity.  The patient specifically requests that if the mitral valve must be replaced that it be done using a bioprosthetic tissue valve.   The patient understands and accepts all potential risks of surgery including but not limited to risk of death, stroke or other neurologic complication, myocardial infarction, congestive heart failure, respiratory failure, renal failure, bleeding requiring transfusion and/or reexploration, arrhythmia, infection or  other wound complications, pneumonia, pleural and/or pericardial effusion, pulmonary embolus, aortic dissection or other major vascular complication, or delayed complications related to valve repair or replacement including but not limited to structural valve deterioration and failure, thrombosis, embolization, endocarditis, or paravalvular leak.  Alternative surgical approaches have been discussed including a comparison between conventional sternotomy and minimally-invasive techniques.  The relative risks and benefits of each have been reviewed as they pertain to the patient's specific circumstances, and all of their questions have been addressed.  Specific risks potentially related to the minimally-invasive approach were discussed at length, including but not limited to risk of conversion to full or partial sternotomy, aortic dissection or other major vascular complication, unilateral acute lung injury or pulmonary edema, phrenic nerve dysfunction or paralysis, rib fracture, chronic pain, lung hernia, or lymphocele.  Expectations for her postoperative convalescence has been discussed.  All of her questions have been answered.      Valentina Gu. Roxy Manns, MD 06/24/2016 2:28 PM

## 2016-06-27 ENCOUNTER — Inpatient Hospital Stay (HOSPITAL_COMMUNITY): Payer: Medicare Other

## 2016-06-27 ENCOUNTER — Inpatient Hospital Stay (HOSPITAL_COMMUNITY): Payer: Medicare Other | Admitting: Anesthesiology

## 2016-06-27 ENCOUNTER — Encounter (HOSPITAL_COMMUNITY)
Admission: RE | Disposition: A | Discharge status: Home / Self Care | Payer: Self-pay | Source: Ambulatory Visit | Attending: Thoracic Surgery (Cardiothoracic Vascular Surgery)

## 2016-06-27 ENCOUNTER — Inpatient Hospital Stay (HOSPITAL_COMMUNITY)
Admission: RE | Admit: 2016-06-27 | Discharge: 2016-07-04 | DRG: 220 | Disposition: A | Discharge status: Home / Self Care | Payer: Medicare Other | Source: Ambulatory Visit | Attending: Thoracic Surgery (Cardiothoracic Vascular Surgery) | Admitting: Thoracic Surgery (Cardiothoracic Vascular Surgery)

## 2016-06-27 ENCOUNTER — Encounter (HOSPITAL_COMMUNITY): Payer: Self-pay | Admitting: *Deleted

## 2016-06-27 DIAGNOSIS — Z880 Allergy status to penicillin: Secondary | ICD-10-CM

## 2016-06-27 DIAGNOSIS — I700 Atherosclerosis of aorta: Secondary | ICD-10-CM | POA: Diagnosis present

## 2016-06-27 DIAGNOSIS — I340 Nonrheumatic mitral (valve) insufficiency: Secondary | ICD-10-CM | POA: Diagnosis not present

## 2016-06-27 DIAGNOSIS — Z9221 Personal history of antineoplastic chemotherapy: Secondary | ICD-10-CM | POA: Diagnosis not present

## 2016-06-27 DIAGNOSIS — F17210 Nicotine dependence, cigarettes, uncomplicated: Secondary | ICD-10-CM | POA: Diagnosis not present

## 2016-06-27 DIAGNOSIS — D696 Thrombocytopenia, unspecified: Secondary | ICD-10-CM | POA: Diagnosis present

## 2016-06-27 DIAGNOSIS — C3490 Malignant neoplasm of unspecified part of unspecified bronchus or lung: Secondary | ICD-10-CM | POA: Diagnosis not present

## 2016-06-27 DIAGNOSIS — J90 Pleural effusion, not elsewhere classified: Secondary | ICD-10-CM

## 2016-06-27 DIAGNOSIS — E871 Hypo-osmolality and hyponatremia: Secondary | ICD-10-CM | POA: Diagnosis not present

## 2016-06-27 DIAGNOSIS — D5912 Cold autoimmune hemolytic anemia: Secondary | ICD-10-CM | POA: Diagnosis present

## 2016-06-27 DIAGNOSIS — R918 Other nonspecific abnormal finding of lung field: Secondary | ICD-10-CM | POA: Diagnosis not present

## 2016-06-27 DIAGNOSIS — D62 Acute posthemorrhagic anemia: Secondary | ICD-10-CM | POA: Diagnosis not present

## 2016-06-27 DIAGNOSIS — Z7982 Long term (current) use of aspirin: Secondary | ICD-10-CM | POA: Diagnosis not present

## 2016-06-27 DIAGNOSIS — J9811 Atelectasis: Secondary | ICD-10-CM

## 2016-06-27 DIAGNOSIS — I959 Hypotension, unspecified: Secondary | ICD-10-CM | POA: Diagnosis not present

## 2016-06-27 DIAGNOSIS — I5032 Chronic diastolic (congestive) heart failure: Secondary | ICD-10-CM | POA: Diagnosis not present

## 2016-06-27 DIAGNOSIS — Z419 Encounter for procedure for purposes other than remedying health state, unspecified: Secondary | ICD-10-CM

## 2016-06-27 DIAGNOSIS — J449 Chronic obstructive pulmonary disease, unspecified: Secondary | ICD-10-CM | POA: Diagnosis present

## 2016-06-27 DIAGNOSIS — D591 Other autoimmune hemolytic anemias: Secondary | ICD-10-CM | POA: Diagnosis not present

## 2016-06-27 DIAGNOSIS — Z952 Presence of prosthetic heart valve: Secondary | ICD-10-CM

## 2016-06-27 DIAGNOSIS — M341 CR(E)ST syndrome: Secondary | ICD-10-CM | POA: Diagnosis not present

## 2016-06-27 DIAGNOSIS — J939 Pneumothorax, unspecified: Secondary | ICD-10-CM | POA: Diagnosis not present

## 2016-06-27 DIAGNOSIS — Z79899 Other long term (current) drug therapy: Secondary | ICD-10-CM | POA: Diagnosis not present

## 2016-06-27 DIAGNOSIS — Z85118 Personal history of other malignant neoplasm of bronchus and lung: Secondary | ICD-10-CM

## 2016-06-27 DIAGNOSIS — I272 Pulmonary hypertension, unspecified: Secondary | ICD-10-CM | POA: Diagnosis not present

## 2016-06-27 DIAGNOSIS — Z953 Presence of xenogenic heart valve: Secondary | ICD-10-CM

## 2016-06-27 DIAGNOSIS — R0602 Shortness of breath: Secondary | ICD-10-CM | POA: Diagnosis present

## 2016-06-27 DIAGNOSIS — E039 Hypothyroidism, unspecified: Secondary | ICD-10-CM | POA: Diagnosis present

## 2016-06-27 DIAGNOSIS — J4489 Other specified chronic obstructive pulmonary disease: Secondary | ICD-10-CM | POA: Diagnosis present

## 2016-06-27 DIAGNOSIS — Z4682 Encounter for fitting and adjustment of non-vascular catheter: Secondary | ICD-10-CM

## 2016-06-27 HISTORY — DX: Chronic diastolic (congestive) heart failure: I50.32

## 2016-06-27 HISTORY — DX: Raynaud's syndrome without gangrene: I73.00

## 2016-06-27 HISTORY — DX: Other autoimmune hemolytic anemias: D59.1

## 2016-06-27 HISTORY — DX: Presence of xenogenic heart valve: Z95.3

## 2016-06-27 HISTORY — PX: TEE WITHOUT CARDIOVERSION: SHX5443

## 2016-06-27 HISTORY — PX: MITRAL VALVE REPAIR: SHX2039

## 2016-06-27 LAB — POCT I-STAT, CHEM 8
BUN: 18 mg/dL (ref 6–20)
BUN: 13 mg/dL (ref 6–20)
BUN: 16 mg/dL (ref 6–20)
BUN: 14 mg/dL (ref 6–20)
BUN: 14 mg/dL (ref 6–20)
BUN: 16 mg/dL (ref 6–20)
BUN: 13 mg/dL (ref 6–20)
CALCIUM ION: 1.25 mmol/L (ref 1.15–1.40)
CALCIUM ION: 1.13 mmol/L — AB (ref 1.15–1.40)
CALCIUM ION: 1.14 mmol/L — AB (ref 1.15–1.40)
CALCIUM ION: 1.15 mmol/L (ref 1.15–1.40)
CHLORIDE: 102 mmol/L (ref 101–111)
CHLORIDE: 100 mmol/L — AB (ref 101–111)
CHLORIDE: 102 mmol/L (ref 101–111)
CREATININE: 0.3 mg/dL — AB (ref 0.44–1.00)
CREATININE: 0.4 mg/dL — AB (ref 0.44–1.00)
CREATININE: 0.4 mg/dL — AB (ref 0.44–1.00)
CREATININE: 0.6 mg/dL (ref 0.44–1.00)
Calcium, Ion: 1.26 mmol/L (ref 1.15–1.40)
Calcium, Ion: 1.06 mmol/L — ABNORMAL LOW (ref 1.15–1.40)
Calcium, Ion: 1.19 mmol/L (ref 1.15–1.40)
Chloride: 99 mmol/L — ABNORMAL LOW (ref 101–111)
Chloride: 102 mmol/L (ref 101–111)
Chloride: 104 mmol/L (ref 101–111)
Chloride: 101 mmol/L (ref 101–111)
Creatinine, Ser: 0.5 mg/dL (ref 0.44–1.00)
Creatinine, Ser: 0.5 mg/dL (ref 0.44–1.00)
Creatinine, Ser: 0.5 mg/dL (ref 0.44–1.00)
GLUCOSE: 105 mg/dL — AB (ref 65–99)
GLUCOSE: 95 mg/dL (ref 65–99)
GLUCOSE: 145 mg/dL — AB (ref 65–99)
GLUCOSE: 128 mg/dL — AB (ref 65–99)
GLUCOSE: 109 mg/dL — AB (ref 65–99)
GLUCOSE: 134 mg/dL — AB (ref 65–99)
Glucose, Bld: 102 mg/dL — ABNORMAL HIGH (ref 65–99)
HCT: 27 % — ABNORMAL LOW (ref 36.0–46.0)
HCT: 23 % — ABNORMAL LOW (ref 36.0–46.0)
HCT: 24 % — ABNORMAL LOW (ref 36.0–46.0)
HCT: 24 % — ABNORMAL LOW (ref 36.0–46.0)
HEMATOCRIT: 23 % — AB (ref 36.0–46.0)
HEMATOCRIT: 27 % — AB (ref 36.0–46.0)
HEMATOCRIT: 23 % — AB (ref 36.0–46.0)
HEMOGLOBIN: 8.2 g/dL — AB (ref 12.0–15.0)
Hemoglobin: 9.2 g/dL — ABNORMAL LOW (ref 12.0–15.0)
Hemoglobin: 7.8 g/dL — ABNORMAL LOW (ref 12.0–15.0)
Hemoglobin: 9.2 g/dL — ABNORMAL LOW (ref 12.0–15.0)
Hemoglobin: 7.8 g/dL — ABNORMAL LOW (ref 12.0–15.0)
Hemoglobin: 8.2 g/dL — ABNORMAL LOW (ref 12.0–15.0)
Hemoglobin: 7.8 g/dL — ABNORMAL LOW (ref 12.0–15.0)
POTASSIUM: 4.4 mmol/L (ref 3.5–5.1)
POTASSIUM: 3.9 mmol/L (ref 3.5–5.1)
POTASSIUM: 4.4 mmol/L (ref 3.5–5.1)
Potassium: 4.4 mmol/L (ref 3.5–5.1)
Potassium: 6 mmol/L — ABNORMAL HIGH (ref 3.5–5.1)
Potassium: 6.2 mmol/L — ABNORMAL HIGH (ref 3.5–5.1)
Potassium: 5.2 mmol/L — ABNORMAL HIGH (ref 3.5–5.1)
SODIUM: 137 mmol/L (ref 135–145)
Sodium: 138 mmol/L (ref 135–145)
Sodium: 139 mmol/L (ref 135–145)
Sodium: 135 mmol/L (ref 135–145)
Sodium: 136 mmol/L (ref 135–145)
Sodium: 137 mmol/L (ref 135–145)
Sodium: 135 mmol/L (ref 135–145)
TCO2: 29 mmol/L (ref 0–100)
TCO2: 27 mmol/L (ref 0–100)
TCO2: 28 mmol/L (ref 0–100)
TCO2: 27 mmol/L (ref 0–100)
TCO2: 27 mmol/L (ref 0–100)
TCO2: 28 mmol/L (ref 0–100)
TCO2: 25 mmol/L (ref 0–100)

## 2016-06-27 LAB — POCT I-STAT 4, (NA,K, GLUC, HGB,HCT)
GLUCOSE: 81 mg/dL (ref 65–99)
HCT: 21 % — ABNORMAL LOW (ref 36.0–46.0)
Hemoglobin: 7.1 g/dL — ABNORMAL LOW (ref 12.0–15.0)
POTASSIUM: 4.3 mmol/L (ref 3.5–5.1)
SODIUM: 138 mmol/L (ref 135–145)

## 2016-06-27 LAB — CBC
HCT: 24.9 % — ABNORMAL LOW (ref 36.0–46.0)
HEMATOCRIT: 26.1 % — AB (ref 36.0–46.0)
HEMOGLOBIN: 8.5 g/dL — AB (ref 12.0–15.0)
Hemoglobin: 8.2 g/dL — ABNORMAL LOW (ref 12.0–15.0)
MCH: 28.4 pg (ref 26.0–34.0)
MCH: 28.1 pg (ref 26.0–34.0)
MCHC: 32.9 g/dL (ref 30.0–36.0)
MCHC: 32.6 g/dL (ref 30.0–36.0)
MCV: 86.2 fL (ref 78.0–100.0)
MCV: 86.1 fL (ref 78.0–100.0)
PLATELETS: 106 10*3/uL — AB (ref 150–400)
Platelets: 111 10*3/uL — ABNORMAL LOW (ref 150–400)
RBC: 2.89 MIL/uL — ABNORMAL LOW (ref 3.87–5.11)
RBC: 3.03 MIL/uL — ABNORMAL LOW (ref 3.87–5.11)
RDW: 14 % (ref 11.5–15.5)
RDW: 13.7 % (ref 11.5–15.5)
WBC: 9.6 10*3/uL (ref 4.0–10.5)
WBC: 10.7 10*3/uL — ABNORMAL HIGH (ref 4.0–10.5)

## 2016-06-27 LAB — POCT I-STAT 3, ART BLOOD GAS (G3+)
ACID-BASE DEFICIT: 1 mmol/L (ref 0.0–2.0)
ACID-BASE EXCESS: 4 mmol/L — AB (ref 0.0–2.0)
ACID-BASE EXCESS: 1 mmol/L (ref 0.0–2.0)
ACID-BASE EXCESS: 1 mmol/L (ref 0.0–2.0)
Acid-Base Excess: 2 mmol/L (ref 0.0–2.0)
BICARBONATE: 28.3 mmol/L — AB (ref 20.0–28.0)
BICARBONATE: 26.5 mmol/L (ref 20.0–28.0)
BICARBONATE: 26.1 mmol/L (ref 20.0–28.0)
Bicarbonate: 28.3 mmol/L — ABNORMAL HIGH (ref 20.0–28.0)
Bicarbonate: 25 mmol/L (ref 20.0–28.0)
O2 SAT: 100 %
O2 SAT: 100 %
O2 Saturation: 100 %
O2 Saturation: 99 %
O2 Saturation: 98 %
PCO2 ART: 41.4 mmHg (ref 32.0–48.0)
PH ART: 7.342 — AB (ref 7.350–7.450)
PH ART: 7.374 (ref 7.350–7.450)
PO2 ART: 408 mmHg — AB (ref 83.0–108.0)
PO2 ART: 178 mmHg — AB (ref 83.0–108.0)
Patient temperature: 35
TCO2: 30 mmol/L (ref 0–100)
TCO2: 30 mmol/L (ref 0–100)
TCO2: 26 mmol/L (ref 0–100)
TCO2: 28 mmol/L (ref 0–100)
TCO2: 27 mmol/L (ref 0–100)
pCO2 arterial: 41.7 mmHg (ref 32.0–48.0)
pCO2 arterial: 52.2 mmHg — ABNORMAL HIGH (ref 32.0–48.0)
pCO2 arterial: 45.2 mmHg (ref 32.0–48.0)
pCO2 arterial: 40.8 mmHg (ref 32.0–48.0)
pH, Arterial: 7.439 (ref 7.350–7.450)
pH, Arterial: 7.38 (ref 7.350–7.450)
pH, Arterial: 7.41 (ref 7.350–7.450)
pO2, Arterial: 261 mmHg — ABNORMAL HIGH (ref 83.0–108.0)
pO2, Arterial: 163 mmHg — ABNORMAL HIGH (ref 83.0–108.0)
pO2, Arterial: 98 mmHg (ref 83.0–108.0)

## 2016-06-27 LAB — APTT: APTT: 36 s (ref 24–36)

## 2016-06-27 LAB — GLUCOSE, CAPILLARY
GLUCOSE-CAPILLARY: 93 mg/dL (ref 65–99)
GLUCOSE-CAPILLARY: 96 mg/dL (ref 65–99)
Glucose-Capillary: 109 mg/dL — ABNORMAL HIGH (ref 65–99)
Glucose-Capillary: 126 mg/dL — ABNORMAL HIGH (ref 65–99)

## 2016-06-27 LAB — PROTIME-INR
INR: 1.44
Prothrombin Time: 17.7 seconds — ABNORMAL HIGH (ref 11.4–15.2)

## 2016-06-27 LAB — HEMOGLOBIN AND HEMATOCRIT, BLOOD
HCT: 23.6 % — ABNORMAL LOW (ref 36.0–46.0)
HEMOGLOBIN: 7.7 g/dL — AB (ref 12.0–15.0)

## 2016-06-27 LAB — PLATELET COUNT: PLATELETS: 149 10*3/uL — AB (ref 150–400)

## 2016-06-27 LAB — CREATININE, SERUM
Creatinine, Ser: 0.62 mg/dL (ref 0.44–1.00)
GFR calc Af Amer: >60 mL/min (ref >60)
GFR calc non Af Amer: >60 mL/min (ref >60)

## 2016-06-27 LAB — MAGNESIUM: MAGNESIUM: 2.8 mg/dL — AB (ref 1.7–2.4)

## 2016-06-27 SURGERY — REPAIR, MITRAL VALVE, MINIMALLY INVASIVE
Anesthesia: General | Site: Chest | Laterality: Right

## 2016-06-27 MED ORDER — INSULIN ASPART 100 UNIT/ML ~~LOC~~ SOLN
0.0000 [IU] | SUBCUTANEOUS | Status: DC
  Administered 2016-06-27: 2 [IU] via SUBCUTANEOUS

## 2016-06-27 MED ORDER — MIDAZOLAM HCL 2 MG/2ML IJ SOLN
2.0000 mg | INTRAMUSCULAR | Status: DC | PRN
Start: 2016-06-27 — End: 2016-06-28

## 2016-06-27 MED ORDER — ORAL CARE MOUTH RINSE
15.0000 mL | Freq: Four times a day (QID) | OROMUCOSAL | Status: DC
  Administered 2016-06-27: 15 mL via OROMUCOSAL

## 2016-06-27 MED ORDER — LACTATED RINGERS IV SOLN
INTRAVENOUS | Status: DC | PRN
  Administered 2016-06-27 (×2): via INTRAVENOUS

## 2016-06-27 MED ORDER — METOPROLOL TARTRATE 5 MG/5ML IV SOLN
2.5000 mg | INTRAVENOUS | Status: DC | PRN
Start: 2016-06-27 — End: 2016-06-29

## 2016-06-27 MED ORDER — BUPIVACAINE HCL 0.5 % IJ SOLN
INTRAMUSCULAR | Status: DC | PRN
  Administered 2016-06-27: 5 mL

## 2016-06-27 MED ORDER — ACETAMINOPHEN 650 MG RE SUPP
650.0000 mg | Freq: Once | RECTAL | Status: AC
  Administered 2016-06-27: 650 mg via RECTAL

## 2016-06-27 MED ORDER — CHLORHEXIDINE GLUCONATE 0.12% ORAL RINSE (MEDLINE KIT)
15.0000 mL | Freq: Two times a day (BID) | OROMUCOSAL | Status: DC
  Administered 2016-06-27: 15 mL via OROMUCOSAL

## 2016-06-27 MED ORDER — METOPROLOL TARTRATE 12.5 MG HALF TABLET
12.5000 mg | ORAL_TABLET | Freq: Two times a day (BID) | ORAL | Status: DC
  Administered 2016-06-29: 12.5 mg via ORAL
  Filled 2016-06-27 (×4): qty 1

## 2016-06-27 MED ORDER — SODIUM CHLORIDE 0.9 % IR SOLN
Status: DC | PRN
  Administered 2016-06-27: 3000 mL
  Administered 2016-06-27: 1000 mL

## 2016-06-27 MED ORDER — PROPOFOL 10 MG/ML IV BOLUS
INTRAVENOUS | Status: DC | PRN
  Administered 2016-06-27: 50 mg via INTRAVENOUS

## 2016-06-27 MED ORDER — MIDAZOLAM HCL 5 MG/5ML IJ SOLN
INTRAMUSCULAR | Status: DC | PRN
  Administered 2016-06-27 (×2): 2 mg via INTRAVENOUS
  Administered 2016-06-27: 1 mg via INTRAVENOUS
  Administered 2016-06-27: 2 mg via INTRAVENOUS

## 2016-06-27 MED ORDER — ONDANSETRON HCL 4 MG/2ML IJ SOLN
4.0000 mg | Freq: Four times a day (QID) | INTRAMUSCULAR | Status: DC | PRN
  Administered 2016-06-27 – 2016-07-02 (×5): 4 mg via INTRAVENOUS
  Filled 2016-06-27 (×5): qty 2

## 2016-06-27 MED ORDER — 0.9 % SODIUM CHLORIDE (POUR BTL) OPTIME
TOPICAL | Status: DC | PRN
  Administered 2016-06-27: 5000 mL

## 2016-06-27 MED ORDER — ACETAMINOPHEN 500 MG PO TABS
1000.0000 mg | ORAL_TABLET | Freq: Four times a day (QID) | ORAL | Status: AC
  Administered 2016-06-28 – 2016-07-02 (×15): 1000 mg via ORAL
  Filled 2016-06-27 (×18): qty 2

## 2016-06-27 MED ORDER — METOPROLOL TARTRATE 25 MG/10 ML ORAL SUSPENSION: 12.5000 mg | Freq: Two times a day (BID) | ORAL | Status: DC

## 2016-06-27 MED ORDER — LEVOFLOXACIN IN D5W 750 MG/150ML IV SOLN
750.0000 mg | INTRAVENOUS | Status: AC
  Administered 2016-06-28: 750 mg via INTRAVENOUS
  Filled 2016-06-27: qty 150

## 2016-06-27 MED ORDER — FENTANYL CITRATE (PF) 250 MCG/5ML IJ SOLN
INTRAMUSCULAR | Status: AC
  Filled 2016-06-27: qty 25

## 2016-06-27 MED ORDER — NITROGLYCERIN IN D5W 200-5 MCG/ML-% IV SOLN: 0.0000 ug/min | INTRAVENOUS | Status: DC

## 2016-06-27 MED ORDER — INSULIN REGULAR BOLUS VIA INFUSION
0.0000 [IU] | Freq: Three times a day (TID) | INTRAVENOUS | Status: DC
  Filled 2016-06-27: qty 10

## 2016-06-27 MED ORDER — MIDAZOLAM HCL 10 MG/2ML IJ SOLN
INTRAMUSCULAR | Status: AC
  Filled 2016-06-27: qty 2

## 2016-06-27 MED ORDER — BUPIVACAINE HCL (PF) 0.5 % IJ SOLN
INTRAMUSCULAR | Status: AC
  Filled 2016-06-27: qty 10

## 2016-06-27 MED ORDER — VANCOMYCIN HCL IN DEXTROSE 1-5 GM/200ML-% IV SOLN
1000.0000 mg | Freq: Once | INTRAVENOUS | Status: AC
  Administered 2016-06-27: 1000 mg via INTRAVENOUS
  Filled 2016-06-27: qty 200

## 2016-06-27 MED ORDER — PROTAMINE SULFATE 10 MG/ML IV SOLN
INTRAVENOUS | Status: DC | PRN
  Administered 2016-06-27: 10 mg via INTRAVENOUS

## 2016-06-27 MED ORDER — HEPARIN SODIUM (PORCINE) 1000 UNIT/ML IJ SOLN
INTRAMUSCULAR | Status: DC | PRN
  Administered 2016-06-27: 27000 [IU] via INTRAVENOUS

## 2016-06-27 MED ORDER — MIDAZOLAM HCL 2 MG/2ML IJ SOLN
INTRAMUSCULAR | Status: AC
  Filled 2016-06-27: qty 2

## 2016-06-27 MED ORDER — FAMOTIDINE IN NACL 20-0.9 MG/50ML-% IV SOLN
20.0000 mg | Freq: Two times a day (BID) | INTRAVENOUS | Status: DC
  Administered 2016-06-27: 20 mg via INTRAVENOUS

## 2016-06-27 MED ORDER — DOCUSATE SODIUM 100 MG PO CAPS
200.0000 mg | ORAL_CAPSULE | Freq: Every day | ORAL | Status: DC
  Administered 2016-06-28 – 2016-07-01 (×4): 200 mg via ORAL
  Filled 2016-06-27 (×6): qty 2

## 2016-06-27 MED ORDER — ACETAMINOPHEN 160 MG/5ML PO SOLN: 650.0000 mg | Freq: Once | ORAL | Status: AC

## 2016-06-27 MED ORDER — SODIUM CHLORIDE 0.9 % IV SOLN
INTRAVENOUS | Status: AC
  Administered 2016-06-27: 14:00:00 via INTRAVENOUS

## 2016-06-27 MED ORDER — DEXMEDETOMIDINE HCL IN NACL 200 MCG/50ML IV SOLN
0.0000 ug/kg/h | INTRAVENOUS | Status: DC
  Administered 2016-06-28: 0.2 ug/kg/h via INTRAVENOUS
  Filled 2016-06-27: qty 50

## 2016-06-27 MED ORDER — ALBUMIN HUMAN 5 % IV SOLN
INTRAVENOUS | Status: DC | PRN
  Administered 2016-06-27: 13:00:00 via INTRAVENOUS

## 2016-06-27 MED ORDER — FENTANYL CITRATE (PF) 100 MCG/2ML IJ SOLN
INTRAMUSCULAR | Status: AC
  Filled 2016-06-27: qty 2

## 2016-06-27 MED ORDER — FENTANYL CITRATE (PF) 250 MCG/5ML IJ SOLN
INTRAMUSCULAR | Status: DC | PRN
  Administered 2016-06-27: 50 ug via INTRAVENOUS
  Administered 2016-06-27: 150 ug via INTRAVENOUS
  Administered 2016-06-27 (×2): 100 ug via INTRAVENOUS
  Administered 2016-06-27: 150 ug via INTRAVENOUS
  Administered 2016-06-27: 100 ug via INTRAVENOUS
  Administered 2016-06-27: 150 ug via INTRAVENOUS
  Administered 2016-06-27: 100 ug via INTRAVENOUS
  Administered 2016-06-27: 50 ug via INTRAVENOUS
  Administered 2016-06-27 (×3): 100 ug via INTRAVENOUS
  Administered 2016-06-27: 50 ug via INTRAVENOUS
  Administered 2016-06-27: 350 ug via INTRAVENOUS
  Administered 2016-06-27: 100 ug via INTRAVENOUS

## 2016-06-27 MED ORDER — ACETAMINOPHEN 160 MG/5ML PO SOLN: 1000.0000 mg | Freq: Four times a day (QID) | ORAL | Status: DC

## 2016-06-27 MED ORDER — FENTANYL CITRATE (PF) 250 MCG/5ML IJ SOLN
INTRAMUSCULAR | Status: AC
  Filled 2016-06-27: qty 10

## 2016-06-27 MED ORDER — CHLORHEXIDINE GLUCONATE 0.12 % MT SOLN
15.0000 mL | OROMUCOSAL | Status: AC
  Administered 2016-06-27: 15 mL via OROMUCOSAL

## 2016-06-27 MED ORDER — ALBUMIN HUMAN 5 % IV SOLN: 250.0000 mL | INTRAVENOUS | Status: AC | PRN

## 2016-06-27 MED ORDER — LACTATED RINGERS IV SOLN: INTRAVENOUS | Status: DC

## 2016-06-27 MED ORDER — LEVOTHYROXINE SODIUM 75 MCG PO TABS
175.0000 ug | ORAL_TABLET | Freq: Every day | ORAL | Status: DC
  Administered 2016-06-28 – 2016-07-04 (×7): 175 ug via ORAL
  Filled 2016-06-27 (×7): qty 1

## 2016-06-27 MED ORDER — EPHEDRINE SULFATE 50 MG/ML IJ SOLN
INTRAMUSCULAR | Status: DC | PRN
  Administered 2016-06-27: 5 mg via INTRAVENOUS

## 2016-06-27 MED ORDER — TRAMADOL HCL 50 MG PO TABS
50.0000 mg | ORAL_TABLET | ORAL | Status: DC | PRN
  Administered 2016-06-28 – 2016-06-29 (×3): 100 mg via ORAL
  Filled 2016-06-27 (×3): qty 2

## 2016-06-27 MED ORDER — ASPIRIN 81 MG PO CHEW: 324.0000 mg | CHEWABLE_TABLET | Freq: Every day | ORAL | Status: DC

## 2016-06-27 MED ORDER — SODIUM CHLORIDE 0.9 % IV SOLN
0.0000 ug/min | INTRAVENOUS | Status: DC
  Filled 2016-06-27: qty 2

## 2016-06-27 MED ORDER — INSULIN ASPART 100 UNIT/ML ~~LOC~~ SOLN: 0.0000 [IU] | SUBCUTANEOUS | Status: DC

## 2016-06-27 MED ORDER — MORPHINE SULFATE (PF) 2 MG/ML IV SOLN: 1.0000 mg | INTRAVENOUS | Status: DC | PRN

## 2016-06-27 MED ORDER — MAGNESIUM SULFATE 4 GM/100ML IV SOLN
4.0000 g | Freq: Once | INTRAVENOUS | Status: AC
  Administered 2016-06-27: 4 g via INTRAVENOUS
  Filled 2016-06-27: qty 100

## 2016-06-27 MED ORDER — INSULIN REGULAR HUMAN 100 UNIT/ML IJ SOLN
INTRAMUSCULAR | Status: DC
  Filled 2016-06-27: qty 2.5

## 2016-06-27 MED ORDER — PROPOFOL 10 MG/ML IV BOLUS
INTRAVENOUS | Status: AC
  Filled 2016-06-27: qty 20

## 2016-06-27 MED ORDER — OXYCODONE HCL 5 MG PO TABS
5.0000 mg | ORAL_TABLET | ORAL | Status: DC | PRN
  Administered 2016-06-27: 5 mg via ORAL
  Administered 2016-06-28: 10 mg via ORAL
  Filled 2016-06-27: qty 2
  Filled 2016-06-27: qty 1
  Filled 2016-06-27 (×2): qty 2

## 2016-06-27 MED ORDER — SODIUM CHLORIDE 0.9% FLUSH
3.0000 mL | Freq: Two times a day (BID) | INTRAVENOUS | Status: DC
  Administered 2016-06-28 – 2016-06-29 (×3): 3 mL via INTRAVENOUS

## 2016-06-27 MED ORDER — LACTATED RINGERS IV SOLN
INTRAVENOUS | Status: DC
  Administered 2016-06-28: via INTRAVENOUS

## 2016-06-27 MED ORDER — HYDROCORTISONE NA SUCCINATE PF 1000 MG IJ SOLR
INTRAMUSCULAR | Status: DC | PRN
  Administered 2016-06-27: 125 mg via INTRAVENOUS

## 2016-06-27 MED ORDER — CHLORHEXIDINE GLUCONATE 4 % EX LIQD: 30.0000 mL | CUTANEOUS | Status: DC

## 2016-06-27 MED ORDER — SODIUM CHLORIDE 0.9 % IV SOLN: INTRAVENOUS | Status: DC | PRN

## 2016-06-27 MED ORDER — SODIUM CHLORIDE 0.9 % IV SOLN
INTRAVENOUS | Status: DC
  Administered 2016-06-28: via INTRAVENOUS

## 2016-06-27 MED ORDER — SODIUM CHLORIDE 0.9 % IV SOLN: 250.0000 mL | INTRAVENOUS | Status: DC

## 2016-06-27 MED ORDER — SODIUM CHLORIDE 0.9 % IV SOLN
30.0000 meq | Freq: Once | INTRAVENOUS | Status: DC
  Filled 2016-06-27: qty 15

## 2016-06-27 MED ORDER — LACTATED RINGERS IV SOLN
INTRAVENOUS | Status: DC | PRN
  Administered 2016-06-27: 07:00:00 via INTRAVENOUS

## 2016-06-27 MED ORDER — MORPHINE SULFATE (PF) 2 MG/ML IV SOLN
1.0000 mg | INTRAVENOUS | Status: DC | PRN
  Administered 2016-06-27 – 2016-06-28 (×7): 2 mg via INTRAVENOUS
  Filled 2016-06-27 (×7): qty 1

## 2016-06-27 MED ORDER — LACTATED RINGERS IV SOLN: 500.0000 mL | Freq: Once | INTRAVENOUS | Status: DC | PRN

## 2016-06-27 MED ORDER — ROCURONIUM BROMIDE 10 MG/ML (PF) SYRINGE
PREFILLED_SYRINGE | INTRAVENOUS | Status: DC | PRN
  Administered 2016-06-27 (×5): 50 mg via INTRAVENOUS

## 2016-06-27 MED ORDER — BISACODYL 5 MG PO TBEC
10.0000 mg | DELAYED_RELEASE_TABLET | Freq: Every day | ORAL | Status: DC
  Administered 2016-06-28 – 2016-07-01 (×4): 10 mg via ORAL
  Filled 2016-06-27 (×6): qty 2

## 2016-06-27 MED ORDER — BISACODYL 10 MG RE SUPP: 10.0000 mg | Freq: Every day | RECTAL | Status: DC

## 2016-06-27 MED ORDER — ASPIRIN EC 325 MG PO TBEC: 325.0000 mg | DELAYED_RELEASE_TABLET | Freq: Every day | ORAL | Status: DC

## 2016-06-27 MED ORDER — SODIUM CHLORIDE 0.9% FLUSH: 3.0000 mL | INTRAVENOUS | Status: DC | PRN

## 2016-06-27 MED ORDER — LACTATED RINGERS IV SOLN
INTRAVENOUS | Status: DC | PRN
  Administered 2016-06-27 (×2): via INTRAVENOUS

## 2016-06-27 MED ORDER — ORAL CARE MOUTH RINSE
15.0000 mL | Freq: Two times a day (BID) | OROMUCOSAL | Status: DC
  Administered 2016-06-28 – 2016-07-01 (×7): 15 mL via OROMUCOSAL

## 2016-06-27 MED ORDER — SODIUM CHLORIDE 0.45 % IV SOLN
INTRAVENOUS | Status: DC | PRN
  Administered 2016-06-27: 14:00:00 via INTRAVENOUS

## 2016-06-27 MED ORDER — BUPIVACAINE 0.5 % ON-Q PUMP SINGLE CATH 400 ML
400.0000 mL | INJECTION | Status: AC
  Administered 2016-06-27: 400 mL
  Filled 2016-06-27: qty 400

## 2016-06-27 MED ORDER — PROMETHAZINE HCL 25 MG/ML IJ SOLN
12.5000 mg | Freq: Four times a day (QID) | INTRAMUSCULAR | Status: DC | PRN
  Administered 2016-06-27 – 2016-06-28 (×3): 12.5 mg via INTRAVENOUS
  Filled 2016-06-27 (×3): qty 1

## 2016-06-27 MED ORDER — PANTOPRAZOLE SODIUM 40 MG PO TBEC
40.0000 mg | DELAYED_RELEASE_TABLET | Freq: Every day | ORAL | Status: DC
  Administered 2016-06-29 – 2016-07-04 (×6): 40 mg via ORAL
  Filled 2016-06-27 (×6): qty 1

## 2016-06-27 MED FILL — Potassium Chloride Inj 2 mEq/ML: INTRAVENOUS | Qty: 40 | Status: CN

## 2016-06-27 MED FILL — Magnesium Sulfate Inj 50%: INTRAMUSCULAR | Qty: 10 | Status: AC

## 2016-06-27 MED FILL — Electrolyte-R (PH 7.4) Solution: INTRAVENOUS | Qty: 3000 | Status: AC

## 2016-06-27 MED FILL — Heparin Sodium (Porcine) Inj 1000 Unit/ML: INTRAMUSCULAR | Qty: 30 | Status: AC

## 2016-06-27 MED FILL — Potassium Chloride Inj 2 mEq/ML: INTRAVENOUS | Qty: 40 | Status: AC

## 2016-06-27 MED FILL — Sodium Bicarbonate IV Soln 8.4%: INTRAVENOUS | Qty: 50 | Status: AC

## 2016-06-27 MED FILL — Sodium Chloride IV Soln 0.9%: INTRAVENOUS | Qty: 2000 | Status: AC

## 2016-06-27 MED FILL — Mannitol IV Soln 20%: INTRAVENOUS | Qty: 500 | Status: AC

## 2016-06-27 MED FILL — Magnesium Sulfate Inj 50%: INTRAMUSCULAR | Qty: 10 | Status: CN

## 2016-06-27 MED FILL — Lidocaine HCl IV Inj 20 MG/ML: INTRAVENOUS | Qty: 5 | Status: AC

## 2016-06-27 MED FILL — Heparin Sodium (Porcine) Inj 1000 Unit/ML: INTRAMUSCULAR | Qty: 30 | Status: CN

## 2016-06-27 SURGICAL SUPPLY — 93 items
ADAPTER CARDIO PERF ANTE/RETRO (ADAPTER) ×3 IMPLANT
BAG DECANTER FOR FLEXI CONT (MISCELLANEOUS) ×6 IMPLANT
BLADE SAW STERNAL (BLADE) ×3 IMPLANT
BLADE SURG 11 STRL SS (BLADE) ×3 IMPLANT
CANISTER SUCTION 2500CC (MISCELLANEOUS) ×6 IMPLANT
CANNULA FEM VENOUS REMOTE 22FR (CANNULA) ×3 IMPLANT
CANNULA FEMORAL ART 14 SM (MISCELLANEOUS) ×3 IMPLANT
CANNULA GUNDRY RCSP 15FR (MISCELLANEOUS) ×3 IMPLANT
CANNULA OPTISITE PERFUSION 16F (CANNULA) ×3 IMPLANT
CANNULA OPTISITE PERFUSION 18F (CANNULA) IMPLANT
CANNULA SUMP PERICARDIAL (CANNULA) ×6 IMPLANT
CATH KIT ON Q 5IN SLV (PAIN MANAGEMENT) ×3 IMPLANT
CONN ST 1/4X3/8  BEN (MISCELLANEOUS) ×2
CONN ST 1/4X3/8 BEN (MISCELLANEOUS) ×4 IMPLANT
CONNECTOR 1/2X3/8X1/2 3 WAY (MISCELLANEOUS) ×1
CONNECTOR 1/2X3/8X1/2 3WAY (MISCELLANEOUS) ×2 IMPLANT
CONT SPEC STER OR (MISCELLANEOUS) ×3 IMPLANT
COVER BACK TABLE 24X17X13 BIG (DRAPES) ×3 IMPLANT
COVER TRANSDUCER ULTRASND GEL (DRAPE) ×3 IMPLANT
CRADLE DONUT ADULT HEAD (MISCELLANEOUS) ×3 IMPLANT
DERMABOND ADVANCED (GAUZE/BANDAGES/DRESSINGS) ×2
DERMABOND ADVANCED .7 DNX12 (GAUZE/BANDAGES/DRESSINGS) ×4 IMPLANT
DEVICE PMI PUNCTURE CLOSURE (MISCELLANEOUS) ×3 IMPLANT
DEVICE SUT CK QUICK LOAD MINI (Prosthesis & Implant Heart) ×36 IMPLANT
DEVICE TROCAR PUNCTURE CLOSURE (ENDOMECHANICALS) ×3 IMPLANT
DRAIN CHANNEL 28F RND 3/8 FF (WOUND CARE) ×6 IMPLANT
DRAPE BILATERAL SPLIT (DRAPES) ×3 IMPLANT
DRAPE C-ARM 42X72 X-RAY (DRAPES) ×3 IMPLANT
DRAPE CV SPLIT W-CLR ANES SCRN (DRAPES) ×3 IMPLANT
DRAPE INCISE IOBAN 66X45 STRL (DRAPES) ×9 IMPLANT
DRAPE SLUSH/WARMER DISC (DRAPES) ×3 IMPLANT
DRSG COVADERM 4X8 (GAUZE/BANDAGES/DRESSINGS) ×3 IMPLANT
ELECT BLADE 6.5 EXT (BLADE) ×3 IMPLANT
ELECT REM PT RETURN 9FT ADLT (ELECTROSURGICAL) ×6
ELECTRODE REM PT RTRN 9FT ADLT (ELECTROSURGICAL) ×4 IMPLANT
FELT TEFLON 1X6 (MISCELLANEOUS) ×9 IMPLANT
FEMORAL VENOUS CANN RAP (CANNULA) ×3 IMPLANT
GLOVE BIO SURGEON STRL SZ 6.5 (GLOVE) ×3 IMPLANT
GLOVE BIOGEL PI IND STRL 6.5 (GLOVE) ×6 IMPLANT
GLOVE BIOGEL PI INDICATOR 6.5 (GLOVE) ×3
GLOVE ORTHO TXT STRL SZ7.5 (GLOVE) ×9 IMPLANT
GLOVE SS N UNI LF 6.0 STRL (GLOVE) ×3 IMPLANT
GOWN STRL REUS W/ TWL LRG LVL3 (GOWN DISPOSABLE) ×8 IMPLANT
GOWN STRL REUS W/TWL LRG LVL3 (GOWN DISPOSABLE) ×4
KIT BASIN OR (CUSTOM PROCEDURE TRAY) ×3 IMPLANT
KIT DILATOR VASC 18G NDL (KITS) ×3 IMPLANT
KIT DRAINAGE VACCUM ASSIST (KITS) ×3 IMPLANT
KIT ROOM TURNOVER OR (KITS) ×3 IMPLANT
KIT SUCTION CATH 14FR (SUCTIONS) ×3 IMPLANT
KIT SUT CK MINI COMBO 4X17 (Prosthesis & Implant Heart) ×36 IMPLANT
LEAD PACING MYOCARDI (MISCELLANEOUS) ×3 IMPLANT
LINE VENT (MISCELLANEOUS) ×3 IMPLANT
NEEDLE AORTIC ROOT 14G 7F (CATHETERS) ×3 IMPLANT
NS IRRIG 1000ML POUR BTL (IV SOLUTION) ×15 IMPLANT
PACK OPEN HEART (CUSTOM PROCEDURE TRAY) ×3 IMPLANT
PAD ARMBOARD 7.5X6 YLW CONV (MISCELLANEOUS) ×6 IMPLANT
PAD ELECT DEFIB RADIOL ZOLL (MISCELLANEOUS) ×3 IMPLANT
SET CANNULATION TOURNIQUET (MISCELLANEOUS) ×3 IMPLANT
SET CARDIOPLEGIA MPS 5001102 (MISCELLANEOUS) ×3 IMPLANT
SET IRRIG TUBING LAPAROSCOPIC (IRRIGATION / IRRIGATOR) ×3 IMPLANT
SOLUTION ANTI FOG 6CC (MISCELLANEOUS) ×3 IMPLANT
SPONGE GAUZE 4X4 12PLY STER LF (GAUZE/BANDAGES/DRESSINGS) ×6 IMPLANT
SUT BONE WAX W31G (SUTURE) ×3 IMPLANT
SUT E-PACK MINIMALLY INVASIVE (SUTURE) ×3 IMPLANT
SUT ETHIBON 2 0 V 52N 30 (SUTURE) ×3 IMPLANT
SUT ETHIBOND 2 0 SH (SUTURE) ×6 IMPLANT
SUT ETHIBOND NAB MH 2-0 36IN (SUTURE) ×3 IMPLANT
SUT ETHIBOND X763 2 0 SH 1 (SUTURE) IMPLANT
SUT GORETEX CV 4 TH 22 36 (SUTURE) ×3 IMPLANT
SUT GORETEX CV4 TH-18 (SUTURE) ×6 IMPLANT
SUT PROLENE 3 0 SH 1 (SUTURE) ×3 IMPLANT
SUT PROLENE 3 0 SH1 36 (SUTURE) ×12 IMPLANT
SUT PROLENE 4 0 RB 1 (SUTURE) ×2
SUT PROLENE 4-0 RB1 .5 CRCL 36 (SUTURE) ×4 IMPLANT
SUT SILK 2 0 SH CR/8 (SUTURE) ×3 IMPLANT
SUT SILK 3 0 SH CR/8 (SUTURE) ×3 IMPLANT
SUT VIC AB 2-0 CTX 36 (SUTURE) ×6 IMPLANT
SUT VIC AB 3-0 SH 8-18 (SUTURE) ×15 IMPLANT
SUT VICRYL 2 TP 1 (SUTURE) ×3 IMPLANT
SYRINGE 10CC LL (SYRINGE) ×3 IMPLANT
SYSTEM SAHARA CHEST DRAIN ATS (WOUND CARE) ×3 IMPLANT
TAPE CLOTH SURG 4X10 WHT LF (GAUZE/BANDAGES/DRESSINGS) ×6 IMPLANT
TOWEL OR 17X24 6PK STRL BLUE (TOWEL DISPOSABLE) ×6 IMPLANT
TOWEL OR 17X26 10 PK STRL BLUE (TOWEL DISPOSABLE) ×6 IMPLANT
TRAY FOLEY IC TEMP SENS 16FR (CATHETERS) ×3 IMPLANT
TROCAR XCEL BLADELESS 5X75MML (TROCAR) ×3 IMPLANT
TROCAR XCEL NON-BLD 11X100MML (ENDOMECHANICALS) ×6 IMPLANT
TUBE SUCT INTRACARD DLP 20F (MISCELLANEOUS) ×3 IMPLANT
TUNNELER SHEATH ON-Q 11GX8 DSP (PAIN MANAGEMENT) ×3 IMPLANT
UNDERPAD 30X30 (UNDERPADS AND DIAPERS) ×3 IMPLANT
VALVE MAGNA MITRAL 31MM (Prosthesis & Implant Heart) ×3 IMPLANT
WATER STERILE IRR 1000ML POUR (IV SOLUTION) ×6 IMPLANT
WIRE .035 3MM-J 145CM (WIRE) ×3 IMPLANT

## 2016-06-27 NOTE — Anesthesia Procedure Notes (Signed)
Central Venous Catheter Insertion Performed by: Lillia Abed, anesthesiologist Start/End2/12/2016 6:30 AM, 06/27/2016 6:50 AM Preanesthetic checklist: patient identified, IV checked, site marked, risks and benefits discussed, surgical consent, monitors and equipment checked, pre-op evaluation, timeout performed and anesthesia consent Position: Trendelenburg Lidocaine 1% used for infiltration and patient sedated Hand hygiene performed , maximum sterile barriers used  and Seldinger technique used Central line was placed.Sheath introducer Swan type:thermodilution Procedure performed using ultrasound guided technique. Ultrasound Notes:anatomy identified, needle tip was noted to be adjacent to the nerve/plexus identified, no ultrasound evidence of intravascular and/or intraneural injection and image(s) printed for medical record Attempts: 2 Following insertion, line sutured and dressing applied. Post procedure assessment: blood return through all ports, free fluid flow and no air  Patient tolerated the procedure well with no immediate complications. Additional procedure comments: Needed to recannulate L IJ a second time due to inability to pass wire due to angle of needle insertion.Marland Kitchen

## 2016-06-27 NOTE — Progress Notes (Signed)
[facility] BRIEF SICU PROGRESS NOTE  Day of Surgery  S/P Procedure(s) (LRB): MINIMALLY INVASIVE MITRAL VALVE REPLACEMENT (Right) TRANSESOPHAGEAL ECHOCARDIOGRAM (TEE) (N/A)   Extubated uneventfully NSR-AAI paced with stable hemodynamics Breathing comfortably w/ O2 2 L/min Chest tube output low UOP > 100 mL/hr Labs okay  Plan: Continue routine early postop  Rexene Alberts, MD 06/27/2016 7:08 PM

## 2016-06-27 NOTE — Brief Op Note (Addendum)
06/27/2016  12:40 PM  PATIENT:  Joy Williams  66 y.o. 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

## 2016-06-27 NOTE — Progress Notes (Signed)
MD called regarding pt's persisting N/V despite PRN zofran and aromatherapy. Dr. Roxy Manns ordered phenergan 12.5 mg PRN q6 IV.  Will administer and continue to monitor.

## 2016-06-27 NOTE — Progress Notes (Signed)
  Echocardiogram Echocardiogram Transesophageal has been performed.  Joy Williams 06/27/2016, 9:04 AM

## 2016-06-27 NOTE — Telephone Encounter (Signed)
We have attempted to contact the pt several time with no success or call back. Per triage protocol, message will be closed.

## 2016-06-27 NOTE — Anesthesia Procedure Notes (Signed)
Procedure Name: Intubation Date/Time: 06/27/2016 7:58 AM Performed by: Clearnce Sorrel Pre-anesthesia Checklist: Patient identified, Emergency Drugs available, Suction available, Patient being monitored and Timeout performed Patient Re-evaluated:Patient Re-evaluated prior to inductionOxygen Delivery Method: Circle system utilized Preoxygenation: Pre-oxygenation with 100% oxygen Intubation Type: IV induction Ventilation: Mask ventilation without difficulty Laryngoscope Size: Mac and 3 Grade View: Grade I Tube type: Oral Endobronchial tube: Left and 37 Fr Number of attempts: 1 Airway Equipment and Method: Stylet Placement Confirmation: ETT inserted through vocal cords under direct vision,  positive ETCO2 and breath sounds checked- equal and bilateral Tube secured with: Tape Dental Injury: Teeth and Oropharynx as per pre-operative assessment

## 2016-06-27 NOTE — Transfer of Care (Signed)
Immediate Anesthesia Transfer of Care Note  Patient: Laquasha Groome  Procedure(s) Performed: Procedure(s): MINIMALLY INVASIVE MITRAL VALVE REPLACEMENT (Right) TRANSESOPHAGEAL ECHOCARDIOGRAM (TEE) (N/A)  Patient Location: SICU  Anesthesia Type:General  Level of Consciousness: Patient remains intubated per anesthesia plan  Airway & Oxygen Therapy: Patient remains intubated per anesthesia plan  Post-op Assessment: Report given to RN and Post -op Vital signs reviewed and stable  Post vital signs: Reviewed and stable  Last Vitals:  Vitals:   06/27/16 0548 06/27/16 0551  BP: (!) 119/46   Pulse:  (!) 117  Resp:  18  Temp: 36.7 C     Last Pain:  Vitals:   06/27/16 0548  TempSrc: Oral      Patients Stated Pain Goal: 3 (14/43/60 1658)  Complications: No apparent anesthesia complications

## 2016-06-27 NOTE — Anesthesia Postprocedure Evaluation (Signed)
Anesthesia Post Note  Patient: Joy Williams  Procedure(s) Performed: Procedure(s) (LRB): MINIMALLY INVASIVE MITRAL VALVE REPLACEMENT (Right) TRANSESOPHAGEAL ECHOCARDIOGRAM (TEE) (N/A)  Patient location during evaluation: SICU Anesthesia Type: General Level of consciousness: sedated Pain management: pain level controlled Vital Signs Assessment: post-procedure vital signs reviewed and stable Respiratory status: patient remains intubated per anesthesia plan Cardiovascular status: stable Anesthetic complications: no       Last Vitals:  Vitals:   06/27/16 1530 06/27/16 1545  BP: 105/66   Pulse:  80  Resp: (!) 7 (!) 0  Temp: (!) 35.6 C (!) 35.8 C    Last Pain:  Vitals:   06/27/16 0548  TempSrc: Oral                 Parag Dorton,W. EDMOND

## 2016-06-27 NOTE — Procedures (Signed)
Extubation Procedure Note  Patient Details:   Name: Joy Williams DOB: December 06, 1950 MRN: [id number]   Airway Documentation:     Evaluation  O2 sats: stable throughout Complications: No apparent complications Patient did tolerate procedure well. Bilateral Breath Sounds: Rhonchi   Yes  Patient tolerated rapid wean. VC 1 L and NIF -30. Positive for cuff leak. Patient extubated to a 2 Lpm nasal cannula. No signs of dyspnea or stridor. Patient instructed on the Incentive Spirometer achieving 1250 mL three times. Patient resting comfortably. RN at bedside.   Myrtie Neither 06/27/2016, 6:05 PM

## 2016-06-27 NOTE — Interval H&P Note (Signed)
History and Physical Interval Note:  06/27/2016 6:07 AM  Joy Williams  has presented today for surgery, with the diagnosis of MR  The various methods of treatment have been discussed with the patient and family. After consideration of risks, benefits and other options for treatment, the patient has consented to  Procedure(s): MINIMALLY INVASIVE MITRAL VALVE REPAIR or replacement (MVR) (Right) TRANSESOPHAGEAL ECHOCARDIOGRAM (TEE) (N/A) as a surgical intervention .  The patient's history has been reviewed, patient examined, no change in status, stable for surgery.  I have reviewed the patient's chart and labs.  Questions were answered to the patient's satisfaction.     Rexene Alberts

## 2016-06-27 NOTE — Op Note (Signed)
CARDIOTHORACIC SURGERY OPERATIVE NOTE  Date of Procedure:  06/27/2016  Preoperative Diagnosis: Severe Mitral Regurgitation  Postoperative Diagnosis: Same  Procedure:    Minimally-Invasive Mitral Valve Replacement  [facility] Mitral bovine bioprosthetic tissue valve (size 65m, model # 7300TFX, serial # [id number]  Preservation of portions of the chordae tendinae and subvalvular apparatus to both the anterior and posterior leaflet   Surgeon: CValentina Gu ORoxy Manns MD  Assistant: TNicholes Rough PA-C and KBrigid Re PA-S  Anesthesia: WLoralie Champagne MD  Operative Findings:  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 and the subvalvular apparatus  Primarily type II with some type IIIA mitral valve dysfunction causing bileaflet prolapse and leaflet restriction causing severe mitral regurgitation  Normal LV systolic function  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 The patient states that she has been known to have mitral valve prolapse for most of her adult life. She has several other close family members with mitral valve prolapse and her mother underwent mitral valve replacement 2 in the past. For many [place] from the [employer] in November 2015 [place] The patient describes a long history of intermittent dizzy spells and ultimately sought [place] was initially evaluated by Dr. NMeda Coffeein June 2016. Transthoracic echocardiogram performed at that time revealed normal left ventricular systolic function with severe bileaflet prolapse and what was felt to be mild to moderate mitral regurgitation. At approximately the same time she was diagnosed with lung cancer and [place] [facility] She was found to have synchronous primary tumors in the left lung, each of which were treated with wedge resection. She underwent a total of 4 cycles of adjuvant chemotherapy postoperatively. Her most recent follow-up chest CT scan was performed in March 2017 at which time there was no sign of locally recurrent disease. She [facility] approximately 6 months with a repeat chest CT scan at that time. More recently the patient returned to Dr. NFrancesca Omanoffice for follow-up with complaints of intermittent dizzy spells. The patient notes that her blood pressure has always run somewhat on the low side. Over the last few months she has developed worsening exertional shortness of breath. She was seen in follow-up recently by Dr. NMeda Coffeeand transthoracic echocardiogram revealed severe mitral regurgitation. Left ventricular systolic function remained normal with ejection fraction estimated 65-70%. There was mild pulmonary hypertension, severe left atrial enlargement, and mild tricuspid regurgitation. Transesophageal echocardiogram was performed 02/22/2016. This confirmed the presence of bileaflet prolapse of the mitral valve with severe mitral regurgitation. EROwas measured 0.44 cm  corresponding to an estimated regurgitant volume of 93 mL. There  was flow reversal in the pulmonary veins. Left ventricular systolic function appeared normal with ejection fraction estimated 60-65%. There was severe left atrial enlargement. There was prolapse of the posterior leaflet of the tricuspid valve with moderate tricuspid regurgitation. Pulmonary artery pressures were felt to be moderately increased. The patient was referred for surgical consultation.  The patient has been seen in consultation and counseled at length regarding the indications, risks and potential benefits of surgery.  All questions have been answered, and the patient provides full informed consent for the operation as described.  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      DETAILS OF THE OPERATIVE PROCEDURE  Preparation:  The patient is brought to the operating room on the above mentioned date and central monitoring was established by the anesthesia team including placement of Swan-Ganz catheter through the left internal jugular vein.  A radial arterial line is placed. The patient is placed in the supine position on the operating table.  Intravenous antibiotics are administered. General endotracheal anesthesia is induced uneventfully. The patient is initially intubated using a dual lumen endotracheal tube.  A Foley catheter is placed.  Baseline transesophageal echocardiogram was performed.  Findings were notable for severe bileaflet prolapse of the mitral valve with very large redundant leaflets that were extremely thickened, fibrotic, and partially calcified. There were no ruptured chordae tendineae but essentially all of the chordae tendineae were elongated and somewhat thickened and fibrotic. There was severe calcification in the annulus and subvalvular apparatus. There was severe mitral regurgitation. There was mild functional mitral stenosis with peak transvalvular gradient  across the mitral valve reported greater than 10 mmHg. There was normal left ventricular systolic function.  A soft roll is placed behind the patient's left scapula and the neck gently extended and turned to the left.   The patient's right neck, chest, abdomen, both groins, and both lower extremities are prepared and draped in a sterile manner. A time out procedure is performed.  Surgical Approach:  A right miniature anterolateral thoracotomy incision is performed. The incision is placed just lateral to and superior to the right nipple. The pectoralis major muscle is retracted medially and completely preserved. The right pleural space is entered through the 3rd intercostal space. A soft tissue retractor is placed.  Two 11 mm ports are placed through separate stab incisions inferiorly. The right pleural space is insufflated continuously with carbon dioxide gas through the posterior port during the remainder of the operation.  A pledgeted sutures placed through the dome of the right hemidiaphragm and retracted inferiorly to facilitate exposure.  A longitudinal incision is made in the pericardium 3 cm anterior to the phrenic nerve and silk traction sutures are placed on either side of the incision for exposure.   Extracorporeal Cardiopulmonary Bypass and Myocardial Protection:  A small incision is made in the right inguinal crease and the anterior surface of the right common femoral artery and right common femoral vein are identified.  The patient is placed in Trendelenburg position. The right internal jugular vein is cannulated with Seldinger technique and a guidewire advanced into the right atrium. The patient is heparinized systemically. The right internal jugular vein is cannulated with a 14 [place] pediatric femoral venous cannula. Pursestring sutures are placed on the anterior surface of the right common femoral vein and right common femoral artery. The right common femoral vein is cannulated with the  Seldinger technique and a guidewire is advanced under transesophageal echocardiogram guidance through the right  atrium. The femoral vein is cannulated with a long 22 French femoral venous cannula. The right common femoral artery is cannulated with Seldinger technique and a flexible guidewire is advanced until it can be appreciated intraluminally in the descending thoracic aorta on transesophageal echocardiogram. The femoral artery is cannulated with an 18 French femoral arterial cannula.  Adequate heparinization is verified.     The entire pre-bypass portion of the operation was notable for stable hemodynamics.  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 Cardiopulmonary bypass was begun.  Vacuum assist venous drainage is utilized. The incision in the pericardium is extended in both directions. Venous drainage and exposure are notably excellent. A retrograde cardioplegia cannula is placed through the right atrium into the coronary sinus using transesophageal echocardiogram guidance.  An antegrade cardioplegia cannula is placed in the ascending aorta.    The patient is kept at 34C systemic temperature with no active cooling and intermittent rewarming to maintain body temperature..  The aortic cross clamp is applied and warm (34C) blood cardioplegia is delivered initially in an antegrade fashion through the aortic root.  Throughout the remainder of the cross clamp portion of the operation warm blood cardioplegia is given continuously retrograde through the coronary sinus catheter.  Myocardial protection was felt to be excellent.   Mitral Valve Replacement:  A left atriotomy incision was performed through the interatrial groove and extended partially across the back wall of the left atrium after opening the oblique sinus inferiorly.  The mitral valve is exposed using a self-retaining retractor.  The mitral valve was  inspected and notable for Barlow's type myxomatous degenerative disease of the mitral valve with severely enlarged, thickened, fibrotic, and partially calcified leaflets.  Both leaflets were very thickened and partially calcified with severely diminished leaflet pliability. There was severe bileaflet prolapse with elongation of all of the primary chordae tendineae to both leaflets. There was severe thickening and partial fusion of some of these elongated leaflets. There was calcification in the posterior annulus and beneath the annulus particularly underneath the P1 and P2 segment of the posterior leaflet. An attempted valve repair is felt to be entirely impossible given the severe calcification and thickening involving both leaflets. Even the possibility of Alfieri type edge to edge repair is felt contraindicated due to a likelihood that this would create significant mitral stenosis.  The anterior leaflet of the mitral valve was excised sharply, leaving a small rim of the free margin and the associated primary chords.  The posterior leaflet split in the midline and debulked.  The mitral annulus was sized to accept a 31 mm prosthesis.  The left ventricle was irrigated with copious cold saline solution.  Mitral valve replacement was performed using interrupted horizontal mattress 2-0 Ethibond pledgeted sutures with pledgets in the supraannular position.  The remaining portions of the anterior leaflet were incorporated into the suture line laterally, thereby preserving chords to both the anterior and posterior leaflet.  An [facility] Mitral bovine bioprosthetic tissue valve (size 31 mm, model # 7300TFX, serial # [id number]) was implanted uneventfully. The valve seated appropriately with care to position the commissure posts away from the left ventricular outflow tract.  All valve sutures were secured using a Cor-knot device.  The valve was tested with saline and appeared competent.   The valve is again tested  with saline and appears to be perfectly competent with no paravalvular leak.  Rewarming is begun.   Procedure Completion:  The atriotomy was closed using a 2-layer closure of running 3-0 Prolene suture after placing a sump drain across the mitral valve to serve as a left ventricular vent.  The aortic cross clamp was removed after a total cross clamp time of 106 minutes.  Epicardial pacing wires are fixed to the inferior wall of the right ventricule and to the right atrial appendage. The patient is rewarmed to 37C temperature. The left ventricular vent is removed. The antegrade cardioplegia cannula is removed. The patient is weaned and disconnected from cardiopulmonary bypass.  The patient's rhythm at separation from bypass was sinus.  The patient was weaned from bypass without any inotropic support. Total cardiopulmonary bypass time for the operation was 148 minutes.  Followup transesophageal echocardiogram performed after separation from bypass revealed a well-seated bioprosthetic tissue valve in the mitral position that was functioning normally. There was no mitral regurgitation at all and specifically there was no paravalvular leak. Left ventricular systolic function remained normal. There was no significant gradient across the left ventricular outflow tract.  The femoral arterial and venous cannulae were removed uneventfully. There was a palpable pulse in the distal right common femoral artery after removal of the cannula. Protamine was administered to reverse the anticoagulation. The right internal jugular cannula was removed and manual pressure held on the neck for 15 minutes.  Single lung ventilation was begun. The atriotomy closure was inspected for hemostasis. The pericardial sac was drained using a 28 French Bard drain placed through the anterior port incision.  The pericardium was closed using a patch of core matrix bovine submucosal tissue patch. The right pleural space is irrigated with  saline solution and inspected for hemostasis. The right pleural space was drained using a 28 French Bard drain placed through the posterior port incision. The miniature thoracotomy incision was closed in multiple layers in routine fashion. The right groin incision was inspected for hemostasis and closed in multiple layers in routine fashion.  The post-bypass portion of the operation was notable for stable rhythm and hemodynamics.  No blood products were administered during the operation.   Disposition:  The patient tolerated the procedure well.  The patient was reintubated using a single lumen endotracheal tube and subsequently transported to the surgical intensive care unit in stable condition. There were no intraoperative complications. All sponge instrument and needle counts are verified correct at completion of the operation.     Valentina Gu. Roxy Manns MD 06/27/2016 1:24 PM

## 2016-06-28 ENCOUNTER — Encounter (HOSPITAL_COMMUNITY): Payer: Self-pay | Admitting: Thoracic Surgery (Cardiothoracic Vascular Surgery)

## 2016-06-28 ENCOUNTER — Inpatient Hospital Stay (HOSPITAL_COMMUNITY): Payer: Medicare Other

## 2016-06-28 LAB — BASIC METABOLIC PANEL
ANION GAP: 7 (ref 5–15)
BUN: 9 mg/dL (ref 6–20)
CO2: 24 mmol/L (ref 22–32)
Calcium: 8.1 mg/dL — ABNORMAL LOW (ref 8.9–10.3)
Chloride: 100 mmol/L — ABNORMAL LOW (ref 101–111)
Creatinine, Ser: 0.63 mg/dL (ref 0.44–1.00)
GFR calc non Af Amer: >60 mL/min (ref >60)
GLUCOSE: 118 mg/dL — AB (ref 65–99)
Potassium: 4.3 mmol/L (ref 3.5–5.1)
Sodium: 131 mmol/L — ABNORMAL LOW (ref 135–145)

## 2016-06-28 LAB — CBC
HCT: 30.6 % — ABNORMAL LOW (ref 36.0–46.0)
HEMATOCRIT: 25.5 % — AB (ref 36.0–46.0)
HEMOGLOBIN: 8.4 g/dL — AB (ref 12.0–15.0)
HEMOGLOBIN: 9.9 g/dL — AB (ref 12.0–15.0)
MCH: 28 pg (ref 26.0–34.0)
MCH: 27.9 pg (ref 26.0–34.0)
MCHC: 32.9 g/dL (ref 30.0–36.0)
MCHC: 32.4 g/dL (ref 30.0–36.0)
MCV: 85 fL (ref 78.0–100.0)
MCV: 86.2 fL (ref 78.0–100.0)
Platelets: 108 10*3/uL — ABNORMAL LOW (ref 150–400)
Platelets: 112 10*3/uL — ABNORMAL LOW (ref 150–400)
RBC: 3 MIL/uL — ABNORMAL LOW (ref 3.87–5.11)
RBC: 3.55 MIL/uL — ABNORMAL LOW (ref 3.87–5.11)
RDW: 13.7 % (ref 11.5–15.5)
RDW: 13.9 % (ref 11.5–15.5)
WBC: 11.7 10*3/uL — AB (ref 4.0–10.5)
WBC: 12.4 10*3/uL — AB (ref 4.0–10.5)

## 2016-06-28 LAB — GLUCOSE, CAPILLARY
GLUCOSE-CAPILLARY: 40 mg/dL — AB (ref 65–99)
GLUCOSE-CAPILLARY: 68 mg/dL (ref 65–99)
GLUCOSE-CAPILLARY: 98 mg/dL (ref 65–99)
GLUCOSE-CAPILLARY: 97 mg/dL (ref 65–99)
Glucose-Capillary: 114 mg/dL — ABNORMAL HIGH (ref 65–99)
Glucose-Capillary: 35 mg/dL — CL (ref 65–99)
Glucose-Capillary: 58 mg/dL — ABNORMAL LOW (ref 65–99)
Glucose-Capillary: 126 mg/dL — ABNORMAL HIGH (ref 65–99)
Glucose-Capillary: 71 mg/dL (ref 65–99)
Glucose-Capillary: 97 mg/dL (ref 65–99)

## 2016-06-28 LAB — CREATININE, SERUM
CREATININE: 0.78 mg/dL (ref 0.44–1.00)
GFR calc Af Amer: >60 mL/min (ref >60)

## 2016-06-28 LAB — POCT I-STAT, CHEM 8
BUN: 11 mg/dL (ref 6–20)
CREATININE: 0.8 mg/dL (ref 0.44–1.00)
Calcium, Ion: 1.05 mmol/L — ABNORMAL LOW (ref 1.15–1.40)
Chloride: 93 mmol/L — ABNORMAL LOW (ref 101–111)
Glucose, Bld: 110 mg/dL — ABNORMAL HIGH (ref 65–99)
HEMATOCRIT: 29 % — AB (ref 36.0–46.0)
HEMOGLOBIN: 9.9 g/dL — AB (ref 12.0–15.0)
POTASSIUM: 4 mmol/L (ref 3.5–5.1)
Sodium: 130 mmol/L — ABNORMAL LOW (ref 135–145)
TCO2: 27 mmol/L (ref 0–100)

## 2016-06-28 LAB — POCT I-STAT 3, ART BLOOD GAS (G3+)
Acid-Base Excess: 3 mmol/L — ABNORMAL HIGH (ref 0.0–2.0)
Bicarbonate: 27.3 mmol/L (ref 20.0–28.0)
O2 Saturation: 97 %
Patient temperature: 37.7
TCO2: 29 mmol/L (ref 0–100)
pCO2 arterial: 43.6 mmHg (ref 32.0–48.0)
pH, Arterial: 7.408 (ref 7.350–7.450)
pO2, Arterial: 91 mmHg (ref 83.0–108.0)

## 2016-06-28 LAB — MAGNESIUM
MAGNESIUM: 1.8 mg/dL (ref 1.7–2.4)
Magnesium: 2.2 mg/dL (ref 1.7–2.4)

## 2016-06-28 MED ORDER — FUROSEMIDE 10 MG/ML IJ SOLN
20.0000 mg | Freq: Four times a day (QID) | INTRAMUSCULAR | Status: AC
Start: 2016-06-28 — End: 2016-06-28
  Administered 2016-06-28 (×3): 20 mg via INTRAVENOUS
  Filled 2016-06-28 (×3): qty 2

## 2016-06-28 MED ORDER — MOMETASONE FURO-FORMOTEROL FUM 100-5 MCG/ACT IN AERO
2.0000 | INHALATION_SPRAY | Freq: Two times a day (BID) | RESPIRATORY_TRACT | Status: DC
  Administered 2016-06-28 – 2016-07-04 (×10): 2 via RESPIRATORY_TRACT
  Filled 2016-06-28 (×2): qty 8.8

## 2016-06-28 MED ORDER — ASPIRIN EC 81 MG PO TBEC
81.0000 mg | DELAYED_RELEASE_TABLET | Freq: Every day | ORAL | Status: DC
  Administered 2016-06-29 – 2016-06-30 (×2): 81 mg via ORAL
  Filled 2016-06-28 (×2): qty 1

## 2016-06-28 MED ORDER — POLYSACCHARIDE IRON COMPLEX 150 MG PO CAPS
150.0000 mg | ORAL_CAPSULE | Freq: Every day | ORAL | Status: DC
  Administered 2016-06-30 – 2016-07-04 (×5): 150 mg via ORAL
  Filled 2016-06-28 (×5): qty 1

## 2016-06-28 MED ORDER — ASPIRIN EC 81 MG PO TBEC
81.0000 mg | DELAYED_RELEASE_TABLET | Freq: Every day | ORAL | Status: DC
Start: 2016-06-28 — End: 2016-06-28

## 2016-06-28 MED ORDER — ASPIRIN EC 325 MG PO TBEC
325.0000 mg | DELAYED_RELEASE_TABLET | Freq: Every day | ORAL | Status: AC
  Administered 2016-06-28: 325 mg via ORAL
  Filled 2016-06-28: qty 1

## 2016-06-28 MED ORDER — WARFARIN - PHYSICIAN DOSING INPATIENT
Freq: Every day | Status: DC
Start: 2016-06-28 — End: 2016-07-04
  Administered 2016-06-28 – 2016-06-29 (×2): 1

## 2016-06-28 MED ORDER — FA-PYRIDOXINE-CYANOCOBALAMIN 2.5-25-2 MG PO TABS
1.0000 | ORAL_TABLET | Freq: Every day | ORAL | Status: DC
  Administered 2016-06-29 – 2016-07-04 (×6): 1 via ORAL
  Filled 2016-06-28 (×6): qty 1

## 2016-06-28 MED ORDER — WARFARIN SODIUM 2 MG PO TABS
2.0000 mg | ORAL_TABLET | Freq: Every day | ORAL | Status: DC
  Administered 2016-06-28 – 2016-06-30 (×3): 2 mg via ORAL
  Filled 2016-06-28 (×3): qty 1

## 2016-06-28 NOTE — Care Management Note (Signed)
Case Management Note  Patient Details  Name: Joy Williams MRN: [id number] Date of Birth: 08-26-1950  Subjective/Objective:  Pt is s/p MVR                  Action/Plan:   PTA independent form home with daughter.  Pt states she has access to PCP that she sees on a regular basis and denies barriers to obtaining medications.  CM will continue to follow for discharge needs   Expected Discharge Date:                  Expected Discharge Plan:  Home/Self Care  In-House Referral:     Discharge planning Services  CM Consult  Post Acute Care Choice:    Choice offered to:     DME Arranged:    DME Agency:     HH Arranged:    HH Agency:     Status of Service:  In process, will continue to follow  If discussed at Long Length of Stay Meetings, dates discussed:    Additional Comments:  Maryclare Labrador, RN 06/28/2016, 9:50 AM

## 2016-06-28 NOTE — Progress Notes (Signed)
[address]       [place] [address]             [phone]        CARDIOTHORACIC SURGERY PROGRESS NOTE   R1 Day Post-Op Procedure(s) (LRB): MINIMALLY INVASIVE MITRAL VALVE REPLACEMENT (Right) TRANSESOPHAGEAL ECHOCARDIOGRAM (TEE) (N/A)  Subjective: Feels tired. Reports some pain and tightness on the right side. Has not been up out of bed yet.   Objective: Vital signs: BP Readings from Last 1 Encounters:  06/28/16 (!) 92/47   Pulse Readings from Last 1 Encounters:  06/28/16 90   Resp Readings from Last 1 Encounters:  06/28/16 (!) 27   Temp Readings from Last 1 Encounters:  06/28/16 100.2 F (37.9 C)    Hemodynamics: PAP: (20-37)/(8-16) 37/16 CO:  [3.8 L/min-5.4 L/min] 5.4 L/min CI:  [2.2 L/min/m2-3.1 L/min/m2] 3.1 L/min/m2  Physical Exam:  General:   Well appearing, alert and oriented.   Breath sounds: Bibasilar inspiratory crackles  Heart sounds:  Regular rate and rhythm. No murmur, gallop or rub.  Incisions:  Clean and dry  Abdomen:  Soft and nontender, normoactive bowel sounds  Extremities:  Warm with mild edema present in hands bilaterally.   Intake/Output from previous day: 02/08 0701 - 02/09 0700 In: 5231.6 [P.O.:90; I.V.:4336.6; Blood:205; IV Piggyback:600] Out: 2585 [Urine:3520; Blood:700; Chest Tube:650] Intake/Output this shift: No intake/output data recorded.  Lab Results:  CBC: Recent Labs  06/27/16 2022 06/27/16 2037 06/28/16 0351  WBC 10.7*  --  11.7*  HGB 8.5* 7.8* 8.4*  HCT 26.1* 23.0* 25.5*  PLT 111*  --  108*    BMET:  Recent Labs  06/27/16 2037 06/28/16 0351  NA 135 131*  K 4.4 4.3  CL 101 100*  CO2  --  24  GLUCOSE 134* 118*  BUN 13 9  CREATININE 0.60 0.63  CALCIUM  --  8.1*     PT/INR:   Recent Labs  06/27/16 1405  LABPROT 17.7*  INR 1.44    CBG (last 3)   Recent Labs  06/27/16 1917 06/27/16 2339 06/28/16 0352  GLUCAP 109* 126* 114*    ABG    Component Value Date/Time   PHART 7.408 06/28/2016 0402   PCO2ART 43.6 06/28/2016 0402   PO2ART 91.0 06/28/2016 0402   HCO3 27.3 06/28/2016 0402   TCO2 29 06/28/2016 0402   ACIDBASEDEF 1.0 06/27/2016 1436   O2SAT 97.0 06/28/2016 0402    CXR: Official read pending. Chest tubes and Swan in place. Mild atelectasis, more present on the right side as expected post-operatively.   Assessment/Plan: S/P Procedure(s) (LRB): MINIMALLY INVASIVE MITRAL VALVE REPLACEMENT (Right) TRANSESOPHAGEAL ECHOCARDIOGRAM (TEE) (N/A)  1 CV - Patient in NSR. BP has been on the lower side - hold any antihypertensive medications for now 2 Pulm - Extubated last night and tolerated well. O2 sat 96% on room air. CXR with bibasilar atelectasis, official read still pending.  3 Chest Tubes - 650 ml, serosanguinous - keep tubes to suction 4 Hemodynamics - H/H is 8.4/25.5, will continue to monitor. If Hgb drops too low and patient requires transfusion, any blood products she receives would need to be warmed. INR 1.44, start slowly with coumadin.  5 Renal - Creatinine 0.63. UOP good. Patient up 3 kg from pre-op . Patient has lasix ordered.  6 Endo - blood glucose low when in room - RN giving orange juice and rechecking. Continue to monitor. 7 T 100.2 this AM - continue to monitor, can  give tylenol if needed  8 Continue to mobilize and rehab patient 9 Can probably transfer to SDU later today. Central line and foley can be discontinued.   Brigid Re, PA-S 06/28/2016 8:15 AM  I have seen and examined the patient and agree with the assessment and plan as outlined.  Overall doing very well POD1.  Maintaining NSR w/ stable hemodynamics w/out any drips and breathing comfortably on room air.  Expected post op acute blood loss anemia w/ chronic anemia preop - no indications for transfusion currently but will monitor closely.  Patient has cold agglutinin antibodies so any blood transfusions will need to be given warm.  Mobilize.  Diuresis.  D/C lines.  Leave  chest tubes in until output low.  Start coumadin slowly.  Possible transfer step-down later today.   Rexene Alberts, MD 06/28/2016 8:49 AM

## 2016-06-28 NOTE — Discharge Instructions (Signed)

## 2016-06-28 NOTE — Progress Notes (Signed)
      [address]       [place] [address]             [phone]      POD # 1 MVR  Nausea is primary complaint  BP (!) 112/59   Pulse (!) 104   Temp 98.2 F (36.8 C) (Oral)   Resp (!) 24   Wt 142 lb 3.2 oz (64.5 kg)   SpO2 (!) 75%   BMI 22.27 kg/m    Intake/Output Summary (Last 24 hours) at 06/28/16 1831 Last data filed at 06/28/16 1400  Gross per 24 hour  Intake          2104.11 ml  Output             3590 ml  Net         -1485.89 ml   Creatinine 0.78, Hct= 31  Jeff Frieden C. Roxan Hockey, MD [facility] [phone]

## 2016-06-28 NOTE — Progress Notes (Signed)
NT checked CBG and result was 35.  Rechecked for confirmation and 2nd CBG was 40.  Hypoglycemia treated by giving patient 235m of OJ.  Will recheck blood sugar in 15 minutes and reassess.  Joy Williams

## 2016-06-29 ENCOUNTER — Inpatient Hospital Stay (HOSPITAL_COMMUNITY): Payer: Medicare Other

## 2016-06-29 LAB — BASIC METABOLIC PANEL
ANION GAP: 9 (ref 5–15)
BUN: 13 mg/dL (ref 6–20)
CALCIUM: 8.4 mg/dL — AB (ref 8.9–10.3)
CO2: 29 mmol/L (ref 22–32)
Chloride: 90 mmol/L — ABNORMAL LOW (ref 101–111)
Creatinine, Ser: 0.81 mg/dL (ref 0.44–1.00)
GFR calc non Af Amer: >60 mL/min (ref >60)
Glucose, Bld: 112 mg/dL — ABNORMAL HIGH (ref 65–99)
Potassium: 4 mmol/L (ref 3.5–5.1)
Sodium: 128 mmol/L — ABNORMAL LOW (ref 135–145)

## 2016-06-29 LAB — TYPE AND SCREEN
BLOOD PRODUCT EXPIRATION DATE: 201803052359
BLOOD PRODUCT EXPIRATION DATE: 201803052359
BLOOD PRODUCT EXPIRATION DATE: 201803052359
BLOOD PRODUCT EXPIRATION DATE: 201803072359
BLOOD PRODUCT EXPIRATION DATE: 201803082359
Blood Product Expiration Date: 201803052359
Blood Product Expiration Date: 201803052359
Blood Product Expiration Date: 201803052359
Blood Product Expiration Date: 201803062359
Blood Product Expiration Date: 201803072359
Blood Product Expiration Date: 201803072359
Blood Product Expiration Date: 201803072359
ISSUE DATE / TIME: 201802071622
ISSUE DATE / TIME: 201802071653
ISSUE DATE / TIME: 201802071807
ISSUE DATE / TIME: 201802071850
ISSUE DATE / TIME: 201802080043
ISSUE DATE / TIME: 201802081004
UNIT TYPE AND RH: 5100
UNIT TYPE AND RH: 5100
UNIT TYPE AND RH: 7300
UNIT TYPE AND RH: 7300
UNIT TYPE AND RH: 7300
Unit Type and Rh: 5100
Unit Type and Rh: 5100
Unit Type and Rh: 5100
Unit Type and Rh: 5100
Unit Type and Rh: 7300
Unit Type and Rh: 7300
Unit Type and Rh: 7300

## 2016-06-29 LAB — CBC
HCT: 25.2 % — ABNORMAL LOW (ref 36.0–46.0)
HEMOGLOBIN: 8.2 g/dL — AB (ref 12.0–15.0)
MCH: 27.9 pg (ref 26.0–34.0)
MCHC: 32.5 g/dL (ref 30.0–36.0)
MCV: 85.7 fL (ref 78.0–100.0)
Platelets: 105 10*3/uL — ABNORMAL LOW (ref 150–400)
RBC: 2.94 MIL/uL — AB (ref 3.87–5.11)
RDW: 13.8 % (ref 11.5–15.5)
WBC: 10.2 10*3/uL (ref 4.0–10.5)

## 2016-06-29 LAB — PROTIME-INR
INR: 1.24
PROTHROMBIN TIME: 15.7 s — AB (ref 11.4–15.2)

## 2016-06-29 LAB — GLUCOSE, CAPILLARY
GLUCOSE-CAPILLARY: 116 mg/dL — AB (ref 65–99)
GLUCOSE-CAPILLARY: 91 mg/dL (ref 65–99)
Glucose-Capillary: 104 mg/dL — ABNORMAL HIGH (ref 65–99)

## 2016-06-29 MED ORDER — SODIUM CHLORIDE 0.9 % IV SOLN: 250.0000 mL | INTRAVENOUS | Status: DC | PRN

## 2016-06-29 MED ORDER — MOVING RIGHT ALONG BOOK
Freq: Once | Status: AC
  Administered 2016-06-30: 01:00:00
  Filled 2016-06-29: qty 1

## 2016-06-29 MED ORDER — ZOLPIDEM TARTRATE 5 MG PO TABS
5.0000 mg | ORAL_TABLET | Freq: Every evening | ORAL | Status: DC | PRN
  Administered 2016-06-30 – 2016-07-02 (×2): 5 mg via ORAL
  Filled 2016-06-29 (×4): qty 1

## 2016-06-29 MED ORDER — INSULIN ASPART 100 UNIT/ML ~~LOC~~ SOLN: 0.0000 [IU] | Freq: Three times a day (TID) | SUBCUTANEOUS | Status: DC

## 2016-06-29 MED ORDER — SODIUM CHLORIDE 0.9% FLUSH
3.0000 mL | Freq: Two times a day (BID) | INTRAVENOUS | Status: DC
  Administered 2016-06-29 – 2016-07-03 (×7): 3 mL via INTRAVENOUS

## 2016-06-29 MED ORDER — MAGNESIUM HYDROXIDE 400 MG/5ML PO SUSP: 30.0000 mL | Freq: Every day | ORAL | Status: DC | PRN

## 2016-06-29 MED ORDER — ALUM & MAG HYDROXIDE-SIMETH 200-200-20 MG/5ML PO SUSP: 15.0000 mL | ORAL | Status: DC | PRN

## 2016-06-29 MED ORDER — TRAMADOL HCL 50 MG PO TABS
50.0000 mg | ORAL_TABLET | ORAL | Status: DC | PRN
  Administered 2016-06-29 – 2016-07-03 (×6): 100 mg via ORAL
  Filled 2016-06-29 (×6): qty 2

## 2016-06-29 MED ORDER — SODIUM CHLORIDE 0.9% FLUSH: 3.0000 mL | INTRAVENOUS | Status: DC | PRN

## 2016-06-29 NOTE — Progress Notes (Signed)
2 Days Post-Op Procedure(s) (LRB): MINIMALLY INVASIVE MITRAL VALVE REPLACEMENT (Right) TRANSESOPHAGEAL ECHOCARDIOGRAM (TEE) (N/A) Subjective: Feels better today, nausea improved  Objective: Vital signs in last 24 hours: Temp:  [98 F (36.7 C)-98.9 F (37.2 C)] 98.3 F (36.8 C) (02/10 0700) Pulse Rate:  [89-104] 99 (02/10 0700) Cardiac Rhythm: Normal sinus rhythm;Heart block (02/10 0400) Resp:  [14-26] 16 (02/10 0700) BP: (74-128)/(33-81) 104/66 (02/10 0530) SpO2:  [79 %-100 %] 100 % (02/10 0850) Weight:  [137 lb 4.8 oz (62.3 kg)] 137 lb 4.8 oz (62.3 kg) (02/10 0600)  Hemodynamic parameters for last 24 hours:    Intake/Output from previous day: 02/09 0701 - 02/10 0700 In: 1360 [P.O.:1210; IV Piggyback:150] Out: 3295 [Urine:3500; Chest Tube:490] Intake/Output this shift: No intake/output data recorded.  General appearance: alert, cooperative and no distress Neurologic: intact Heart: tachy, regular Lungs: diminished breath sounds bibasilar Abdomen: normal findings: soft, non-tender  Lab Results:  Recent Labs  06/28/16 1658 06/28/16 1723 06/29/16 0401  WBC 12.4*  --  10.2  HGB 9.9* 9.9* 8.2*  HCT 30.6* 29.0* 25.2*  PLT 112*  --  105*   BMET:  Recent Labs  06/28/16 0351  06/28/16 1723 06/29/16 0401  NA 131*  --  130* 128*  K 4.3  --  4.0 4.0  CL 100*  --  93* 90*  CO2 24  --   --  29  GLUCOSE 118*  --  110* 112*  BUN 9  --  11 13  CREATININE 0.63  < > 0.80 0.81  CALCIUM 8.1*  --   --  8.4*  < > = values in this interval not displayed.  PT/INR:  Recent Labs  06/29/16 0401  LABPROT 15.7*  INR 1.24   ABG    Component Value Date/Time   PHART 7.408 06/28/2016 0402   HCO3 27.3 06/28/2016 0402   TCO2 27 06/28/2016 1723   ACIDBASEDEF 1.0 06/27/2016 1436   O2SAT 97.0 06/28/2016 0402   CBG (last 3)   Recent Labs  06/28/16 2315 06/29/16 0336 06/29/16 0814  GLUCAP 97 116* 104*    Assessment/Plan: S/P Procedure(s) (LRB): MINIMALLY INVASIVE  MITRAL VALVE REPLACEMENT (Right) TRANSESOPHAGEAL ECHOCARDIOGRAM (TEE) (N/A) Plan for transfer to step-down: see transfer orders  POD # 2 MVR CV- mildly tachycardic, BP relatively low, in SR  Coumadin RESP- IS RENAL- creatinine normal, hyponatremia ENDO- CBG well controlled, change to AC/HS Anemia- Hgb 25, follow, avoid transfusion of possible Mobilize    LOS: 2 days    Melrose Nakayama 06/29/2016

## 2016-06-30 ENCOUNTER — Inpatient Hospital Stay (HOSPITAL_COMMUNITY): Payer: Medicare Other

## 2016-06-30 LAB — BASIC METABOLIC PANEL
Anion gap: 7 (ref 5–15)
BUN: 12 mg/dL (ref 6–20)
CALCIUM: 8.2 mg/dL — AB (ref 8.9–10.3)
CO2: 29 mmol/L (ref 22–32)
Chloride: 92 mmol/L — ABNORMAL LOW (ref 101–111)
Creatinine, Ser: 0.75 mg/dL (ref 0.44–1.00)
GFR calc Af Amer: >60 mL/min (ref >60)
GLUCOSE: 97 mg/dL (ref 65–99)
Potassium: 4 mmol/L (ref 3.5–5.1)
Sodium: 128 mmol/L — ABNORMAL LOW (ref 135–145)

## 2016-06-30 LAB — CBC
HCT: 21.4 % — ABNORMAL LOW (ref 36.0–46.0)
Hemoglobin: 7 g/dL — ABNORMAL LOW (ref 12.0–15.0)
MCH: 27.7 pg (ref 26.0–34.0)
MCHC: 32.7 g/dL (ref 30.0–36.0)
MCV: 84.6 fL (ref 78.0–100.0)
PLATELETS: 85 10*3/uL — AB (ref 150–400)
RBC: 2.53 MIL/uL — ABNORMAL LOW (ref 3.87–5.11)
RDW: 13.8 % (ref 11.5–15.5)
WBC: 7.8 10*3/uL (ref 4.0–10.5)

## 2016-06-30 LAB — GLUCOSE, CAPILLARY
GLUCOSE-CAPILLARY: 101 mg/dL — AB (ref 65–99)
GLUCOSE-CAPILLARY: 104 mg/dL — AB (ref 65–99)
Glucose-Capillary: 97 mg/dL (ref 65–99)
Glucose-Capillary: 99 mg/dL (ref 65–99)

## 2016-06-30 LAB — PROTIME-INR
INR: 1.15
PROTHROMBIN TIME: 14.8 s (ref 11.4–15.2)

## 2016-06-30 LAB — PREPARE RBC (CROSSMATCH)

## 2016-06-30 MED ORDER — COUMADIN BOOK
Freq: Once | Status: AC
  Administered 2016-06-30: 1
  Filled 2016-06-30: qty 1

## 2016-06-30 MED ORDER — SODIUM CHLORIDE 0.9 % IV SOLN: Freq: Once | INTRAVENOUS | Status: DC

## 2016-06-30 MED ORDER — WARFARIN VIDEO: Freq: Once | Status: DC

## 2016-06-30 NOTE — Progress Notes (Addendum)
Patient refused blood transfusion, denies dizziness or tiredness,  patient education completed on the need to have her Hemoglobin within normal range, she verbalised understanding, MD aware , will continue to monitor

## 2016-06-30 NOTE — Progress Notes (Signed)
Patient education completed for removal of pacer wires, she verbalised understanding, 3 pacing wires removed as ordered, patient now on bed rest, call bell within reach will continue to monitor

## 2016-06-30 NOTE — Progress Notes (Signed)
Patient ambulated twice in the hall about 150 ft using a rolling walker on room air ambulation well tolearted will continue to monitor

## 2016-06-30 NOTE — Progress Notes (Addendum)
      [address]       [place] [address]             [phone]      3 Days Post-Op Procedure(s) (LRB): MINIMALLY INVASIVE MITRAL VALVE REPLACEMENT (Right) TRANSESOPHAGEAL ECHOCARDIOGRAM (TEE) (N/A)   Subjective:  Patient states doing okay.  She states she is tired as she didn't get to rest, there are people constantly coming into her room and she has been urinating every hour.  She also states she gets short of breath and dizzy with ambulation   No BM  Objective: Vital signs in last 24 hours: Temp:  [98.3 F (36.8 C)-98.7 F (37.1 C)] 98.6 F (37 C) (02/11 0437) Pulse Rate:  [56-102] 85 (02/11 0437) Cardiac Rhythm: Junctional rhythm (02/11 0700) Resp:  [11-21] 20 (02/11 0437) BP: (92-126)/(39-62) 92/39 (02/11 0437) SpO2:  [90 %-100 %] 90 % (02/11 0028) Weight:  [142 lb 14.4 oz (64.8 kg)] 142 lb 14.4 oz (64.8 kg) (02/11 0437)  Intake/Output from previous day: 02/10 0701 - 02/11 0700 In: 994.5 [P.O.:840; I.V.:154.5] Out: 620 [Urine:400; Chest Tube:220] Intake/Output this shift: Total I/O In: -  Out: 60 [Chest Tube:60]  General appearance: alert, cooperative and no distress Heart: regular rate and rhythm Lungs: clear to auscultation bilaterally Abdomen: soft, non-tender; bowel sounds normal; no masses,  no organomegaly Extremities: edema stable Wound: clean and dry, no hematoma in groin  Lab Results:  Recent Labs  06/29/16 0401 06/30/16 0251  WBC 10.2 7.8  HGB 8.2* 7.0*  HCT 25.2* 21.4*  PLT 105* 85*   BMET:  Recent Labs  06/29/16 0401 06/30/16 0251  NA 128* 128*  K 4.0 4.0  CL 90* 92*  CO2 29 29  GLUCOSE 112* 97  BUN 13 12  CREATININE 0.81 0.75  CALCIUM 8.4* 8.2*    PT/INR:  Recent Labs  06/30/16 0251  LABPROT 14.8  INR 1.15   ABG    Component Value Date/Time   PHART 7.408 06/28/2016 0402   HCO3 27.3 06/28/2016 0402   TCO2 27 06/28/2016 1723   ACIDBASEDEF 1.0 06/27/2016 1436   O2SAT 97.0 06/28/2016 0402   CBG  (last 3)   Recent Labs  06/29/16 1605 06/30/16 0024 06/30/16 0632  GLUCAP 91 97 99    Assessment/Plan: S/P Procedure(s) (LRB): MINIMALLY INVASIVE MITRAL VALVE REPLACEMENT (Right) TRANSESOPHAGEAL ECHOCARDIOGRAM (TEE) (N/A)  1. CV- NSR, mildy tachycardic at times, + hypotension- not currently on BB... INR 1.15 continue coumadin  2. Pulm- no acute issues, off oxygen, chest tube with 220 cc output yesterday (level at 1510 in pleurovac)- can hopefully d/c chest tube tomorrow 3. Renal- creatinine WNL, good U/O not currently on lasix 4. Expected post operative blood loss anemia- Hgb at 7.0, patient mildly symptomatic.. Will discuss need for transfusion with Dr. Roxan Hockey.. She is on iron supplement 5. Petechial rash, face, legs- Plt count 85--- D/C ASA, send HIT panel 6 CBGs- controlled, patient not a diabetic, will d/c SSIP and CBG checks 7. Dispo- patient stable,hypotensive, tachycardic at times, Hgb at 7.0, will discuss possible transfusion, continue coumadin, remove EPW... Leave chest tubes can hopefully remove tomorrow   LOS: 3 days    Ellwood Handler 06/30/2016 Patient seen and examined, agree with above She is requesting not to have a blood transfusion, will recheck in AM  Jamell Laymon C. Roxan Hockey, MD [facility] [phone]

## 2016-07-01 ENCOUNTER — Inpatient Hospital Stay (HOSPITAL_COMMUNITY): Payer: Medicare Other

## 2016-07-01 LAB — PROTIME-INR
INR: 1.05
PROTHROMBIN TIME: 13.8 s (ref 11.4–15.2)

## 2016-07-01 LAB — CBC
HEMATOCRIT: 21.9 % — AB (ref 36.0–46.0)
Hemoglobin: 7 g/dL — ABNORMAL LOW (ref 12.0–15.0)
MCH: 27.2 pg (ref 26.0–34.0)
MCHC: 32 g/dL (ref 30.0–36.0)
MCV: 85.2 fL (ref 78.0–100.0)
PLATELETS: 107 10*3/uL — AB (ref 150–400)
RBC: 2.57 MIL/uL — ABNORMAL LOW (ref 3.87–5.11)
RDW: 13.7 % (ref 11.5–15.5)
WBC: 6.2 10*3/uL (ref 4.0–10.5)

## 2016-07-01 LAB — GLUCOSE, CAPILLARY
GLUCOSE-CAPILLARY: 89 mg/dL (ref 65–99)
Glucose-Capillary: 83 mg/dL (ref 65–99)
Glucose-Capillary: 116 mg/dL — ABNORMAL HIGH (ref 65–99)
Glucose-Capillary: 117 mg/dL — ABNORMAL HIGH (ref 65–99)

## 2016-07-01 LAB — HEPARIN INDUCED PLATELET AB (HIT ANTIBODY): HEPARIN INDUCED PLT AB: 0.23 {OD_unit} (ref 0.000–0.400)

## 2016-07-01 MED ORDER — WARFARIN SODIUM 3 MG PO TABS
3.0000 mg | ORAL_TABLET | Freq: Every day | ORAL | Status: DC
  Administered 2016-07-01: 3 mg via ORAL
  Filled 2016-07-01: qty 1

## 2016-07-01 NOTE — Evaluation (Signed)
Physical Therapy Evaluation/ Discharge Patient Details Name: Joy Williams MRN: [id number] DOB: 04-Sep-1950 Today's Date: 07/01/2016   History of Present Illness  66 yo admitted for mini MVR. PMHx: mitral regurgitation, lung CA s/p wedge resection x 2, COPD, Raynaud's disease  Clinical Impression  Pt very pleasant, moving well and able to perform all basic transfers, gait and stairs without need for assist. Pt with VSS throughout, no increase in pain and will have daughter PRN to assist. Pt encouraged to discuss with family and friends 24hr assist in case daughter does begin work this week. All education completed without further acute needs, will defer activity to cardiac rehab. Pt aware and agreeable to all.     Follow Up Recommendations No PT follow up    Equipment Recommendations  Rolling walker with 5" wheels;3in1 (PT)    Recommendations for Other Services       Precautions / Restrictions Precautions Precautions: None Restrictions Weight Bearing Restrictions: Yes (Sternal Precaution)      Mobility  Bed Mobility Overal bed mobility: Modified Independent                Transfers Overall transfer level: Modified independent                  Ambulation/Gait Ambulation/Gait assistance: Modified independent (Device/Increase time) Ambulation Distance (Feet): 600 Feet Assistive device: Rolling walker (2 wheeled) Gait Pattern/deviations: Step-through pattern;Decreased stride length   Gait velocity interpretation: at or above normal speed for age/gender General Gait Details: cues for position in RW. Pt prefers RW for safety  Stairs Stairs: Yes Stairs assistance: Modified independent (Device/Increase time) Stair Management: One rail Right;Alternating pattern;Forwards Number of Stairs: 20 General stair comments: standing rest half way and after final step grossly 40 sec each, HR 122  Wheelchair Mobility    Modified Rankin (Stroke Patients Only)        Balance Overall balance assessment: No apparent balance deficits (not formally assessed)                                           Pertinent Vitals/Pain Pain Assessment: 0-10 Pain Score: 3  Pain Location: chest tube insertion Pain Descriptors / Indicators: Operative site guarding Pain Intervention(s): Limited activity within patient's tolerance    Home Living Family/patient expects to be discharged to:: Private residence Living Arrangements: Children Available Help at Discharge: Family;Available PRN/intermittently Type of Home: House Home Access: Stairs to enter   [employer] of Steps: 3 Home Layout: Two level;Bed/bath upstairs Home Equipment: None      Prior Function Level of Independence: Independent               Hand Dominance        Extremity/Trunk Assessment   Upper Extremity Assessment Upper Extremity Assessment: Overall WFL for tasks assessed    Lower Extremity Assessment Lower Extremity Assessment: Overall WFL for tasks assessed    Cervical / Trunk Assessment Cervical / Trunk Assessment: Normal  Communication   Communication: No difficulties  Cognition Arousal/Alertness: Awake/alert Behavior During Therapy: WFL for tasks assessed/performed Overall Cognitive Status: Within Functional Limits for tasks assessed                      General Comments      Exercises     Assessment/Plan    PT Assessment Patent does not need any further PT services  PT  Problem List            PT Treatment Interventions      PT Goals (Current goals can be found in the Care Plan section)  Acute Rehab PT Goals PT Goal Formulation: All assessment and education complete, DC therapy    Frequency     Barriers to discharge        Co-evaluation               End of Session   Activity Tolerance: Patient tolerated treatment well Patient left: in chair;with call bell/phone within reach Nurse Communication:  Mobility status         Time: 1518-3437 PT Time Calculation (min) (ACUTE ONLY): 25 min   Charges:   PT Evaluation $PT Eval Moderate Complexity: 1 Procedure     PT G Codes:        Moniqua Engebretsen B Joclyn Alsobrook 07/20/2016, 11:38 AM  Elwyn Reach, [place]

## 2016-07-01 NOTE — Discharge Summary (Signed)
Physician Discharge Summary  Patient ID: Joy Williams MRN: [id number] DOB/AGE: 1950/10/02 65 y.o.  Admit date: 06/27/2016 Discharge date: 07/04/2016  Admission Diagnoses: Patient Active Problem List   Diagnosis Date Noted  . Chronic diastolic congestive heart failure ([place])   . COPD (chronic obstructive pulmonary disease) ([place])   . 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 Lung cancer ([place]) 12/30/2014  . Severe mitral regurgitation 11/15/2014  . Scleroderma ([place]) 10/03/2014  . Asthmatic bronchitis , chronic ([place]) 10/03/2014  . Multiple pulmonary nodules 10/03/2014  . 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 The patient states that she has been known to have mitral valve prolapse for most of her adult life. She has several other close family members with mitral valve prolapse and her mother underwent mitral valve replacement 2 in the past. For many years she was followed by a cardiologist in [place]. She retired from the [employer] in November 2015 and moved to [place] to be near her family. The patient describes a long  history of intermittent dizzy spells and ultimately sought to establish care with a cardiologist locally in [place]. She was initially evaluated by Dr. Meda Coffee in June 2016. Transthoracic echocardiogram performed at that time revealed normal left ventricular systolic function with severe bileaflet prolapse and what was felt to be mild to moderate mitral regurgitation. At approximately the same time she was diagnosed with lung cancer and initially was evaluated locally in [place] but ultimately underwent surgical resection at [facility]. She was found to have synchronous primary tumors in the left lung, each of which were treated with wedge resection. She underwent a total of 4 cycles of adjuvant chemotherapy postoperatively. Her most recent follow-up chest CT scan was performed in March 2017 at which time there was no sign of locally recurrent disease. She was seen in follow-up recently by Dr. Elenor Quinones at [facility] who plans to see the patient again in approximately 6 months with a repeat chest CT scan at that time. More recently the patient returned to Dr. Francesca Oman office for follow-up with complaints of intermittent dizzy spells. The patient notes that her blood pressure has always run somewhat on the low side. Over the last few months she has developed worsening exertional shortness of breath. She was seen in follow-up recently by Dr. Meda Coffee and transthoracic echocardiogram revealed severe mitral regurgitation. Left ventricular systolic function remained normal with ejection fraction estimated 65-70%. There was mild pulmonary hypertension, severe left atrial enlargement, and mild tricuspid regurgitation. Transesophageal echocardiogram was performed 02/22/2016. This confirmed the presence of bileaflet prolapse of the mitral valve with severe mitral regurgitation. EROwas measured 0.44 cm corresponding to an estimated regurgitant volume of 93 mL. There was flow  reversal in the pulmonary veins. Left  ventricular systolic function appeared normal with ejection fraction estimated 60-65%. There was severe left atrial enlargement. There was prolapse of the posterior leaflet of the tricuspid valve with moderate tricuspid regurgitation. Pulmonary artery pressures were felt to be moderately increased. The patient was referred for surgical consultation.  The patient is single and lives locally in [place], [place]. 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 She retired in 2015. She remains entirely functionally independent. She does not exercise on a regular basis. She admits to a long history of exertional shortness of breath which she states has progressed somewhat over the last few months. She does not get short of breath with ordinary activities but only if she does something more strenuous. She denies any history of resting shortness breath, PND, orthopnea, or lower extremity edema. She denies any palpitations. She has frequent dizzy spells with no history of syncope. She denies any history of exertional chest pain or chest tightness. She states that she quit smoking at the time she had surgery for lung cancer, but she resumed smoking over the past year, currently smoking 4 or 5 cigarettes per day. 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 The patient does have Raynaud's disease but none of the other associated findings. 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 Last year she underwent Botox injection of her vocal cord with successful restoration of her previousnormal voice.  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 She was last seen here in our office on 05/27/2016. We had originally planned for surgery in January but this was postponed because the patient had come down with an upper respiratory tract infection. Surgery was rescheduled for 06/27/2016. The patient states that she feels much better and her flulike symptoms completely resolved within 2 weeks ago. She quit smoking and has been exercising on a regular basis. She still reports some exertional shortness of breath, but this only occurs with more strenuous physical activity. Overall she feels well and she is looking forward to getting the surgery put behind her. The remainder of her review of systems is unchanged from previously.  Hospital Course:  On 06/27/2016 Mr. Printy underwent a minimally invasive mitral valve replacement with Dr. ON. She tolerated the procedure well and was transferred to the ICU. On postop day 1 she was doing well. The patient was extubated and and now is tolerating room air. We continued the chest tubes due to copious amounts of serosanguineous output. Her creatinine remains stable with good urine output. We initiated diuretic regimen for fluid overload. We discontinued her lines. She was maintaining normal sinus rhythm and hemodynamically stable. We initiated a low-dose Coumadin at this time. On postop day 2 she continued to progress. She did have some postoperative expected anemia which we continue to follow. The patient did not want a transfusion unless absolutely necessary. She remained in normal sinus rhythm on Coumadin. Postop day 3 her INR slowly began to trend upward. We  continued her chest tube. We removed her epicardial pacing wires at this  time. She remained off oxygen without any respiratory issues. Postop day 4 we were able to discontinue her chest tube. We continue to monitor her blood pressure closely due to borderline hypotension. We held a beta blocker at this time. She did have some thrombocytopenia however her platelets are trending upward direction. Her hemoglobin remained low stable. Today she is ablated with limited assistance, tolerating room air, her incision is healing well, and she is ready to discharge home. We have consulted case management to arrange a home health nurse to be available a few hours a day for the first week. We have also consulted physical therapy and occupational therapy to work on stairs with the patient considering she has multiple sets in her household. She will need to follow-up with her cardiologist and primary care provider as well as our office. She has an appointment to get her INR checked on Monday 07/08/2016 and the results will be given to Dr. Ena Dawley for dosing. Continue ASA '81mg'$  until INR therapeutic.    Consults: None   Significant Diagnostic Studies:  CLINICAL DATA:  Status post mitral valve repair on June 27, 2016.  EXAM: PORTABLE CHEST 1 VIEW  COMPARISON:  PA and lateral chest x-ray of June 30, 2016.  FINDINGS: The tiny right apical pneumothorax is not evident today. The apical chest tube on the right is stable. There is no infiltrate or significant pleural effusion on the right. On the left the retrocardiac region remains mildly dense. There is no significant left pleural effusion. There is stable left apical pleural thickening. The heart is normal in size. There is a prosthetic mitral valve ring in place. There is calcification in the wall of the aortic arch. The pulmonary vascularity is not engorged. The patient has undergone previous plate and screw fixation of a left clavicular fracture.  IMPRESSION: No right apical pneumothorax is observed today. No  acute abnormality evident on the right. Stable right apical chest tube.  No CHF.  Minimal atelectasis or scarring at the left lung base.  Thoracic aortic atherosclerosis.   Electronically Signed   By: David  Martinique M.D.   On: 07/01/2016 07:33  Treatments:  Date of Procedure:                06/27/2016  Preoperative Diagnosis:      Severe Mitral Regurgitation  Postoperative Diagnosis:    Same  Procedure:        Minimally-Invasive Mitral Valve Replacement             [facility] Mitral bovine bioprosthetic tissue valve (size 45m, model # 7300TFX, serial # [id number]             Preservation of portions of the chordae tendinae and subvalvular apparatus to both the anterior and posterior leaflet              Surgeon:        CValentina Gu ORoxy Manns MD  Assistant:       TNicholes Rough PA-C and KBrigid Re PA-S  Anesthesia:    WLoralie Champagne MD  Operative Findings:  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 rate 18, height '5\' 7"'$  (1.702 m), weight 58.2 kg (128 lb 3.2 oz), SpO2 96 %.   General: Well appearing, alert and oriented Breath sounds:CTAB Heart sounds: Regular rate and rhythm. No murmur, gallop or rub. Incisions: Clean and dry. Abdomen:Soft and nontender Extremities:Warm and without edema or cyanosis.   Disposition: 01-Home or Self Care  Discharge Instructions    Discharge patient    Complete by:  As directed    Discharge disposition:  01-Home or Self Care   Discharge  patient date:  07/04/2016     Allergies as of 07/04/2016      Reactions   Penicillins Hives   Unknown, occurred as a child Has patient had a PCN reaction causing immediate rash, facial/tongue/throat swelling, SOB or lightheadedness with hypotension: Yes Has patient had a PCN reaction causing severe rash involving mucus membranes or skin necrosis: No Has patient had a PCN reaction that required hospitalization  Has patient had a PCN reaction occurring within the last 10 years: No If all of the above answers are "NO", then may proceed with Cephalosporin use. No      Medication List    STOP taking these medications   Fish Oil 1200 MG Caps   naproxen sodium 220 MG tablet Commonly known as:  ANAPROX     TAKE these medications   aspirin EC 81 MG tablet Take 81 mg by mouth daily.   COCONUT OIL PO Take 15 mLs by mouth See admin instructions. Daily with coffee   DULERA 100-5 MCG/ACT Aero Generic drug:  mometasone-formoterol INHALE 2 PUFFS BY MOUTH FIRST THING IN THE MORNING AND 2 PUFFS 12 HOURS LATER   folic acid-pyridoxine-cyancobalamin 2.5-25-2 MG Tabs tablet Commonly known as:  FOLTX Take 1 tablet by mouth daily.   iron polysaccharides 150 MG capsule Commonly known as:  NIFEREX Take 1 capsule (150 mg total) by mouth daily.   levothyroxine 175 MCG tablet Commonly known as:  SYNTHROID, LEVOTHROID Take 175 mcg by mouth daily before breakfast.   metoprolol tartrate 25 MG tablet Commonly known as:  LOPRESSOR Take 0.5 tablets (12.5 mg total) by mouth 2 (two) times daily.   multivitamin Liqd Take 30 mLs by mouth daily. VM-100 COMPLETE MULTIVITAMIN   traMADol 50 MG tablet Commonly known as:  ULTRAM Take 1 tablet (50 mg total) by mouth every 6 (six) hours as needed for moderate pain.   warfarin 5 MG tablet Commonly known as:  COUMADIN Take 1 tablet (5 mg total) by mouth daily at 6 PM.            Durable Medical Equipment        Start     Ordered   07/01/16 1531   For home use only DME 3 n 1  Once     07/01/16 1530   07/01/16 1530  For home use only DME Walker rolling  Once    Comments:  Post op MVR  Question:  Patient needs a walker to treat with the following condition  Answer:  General weakness   07/01/16 1530     Follow-up Information    HOLWERDA, Nicki Reaper, MD. Call in 1 day(s).   Specialty:  Internal Medicine Contact information: [place] [address] [phone]        Rexene Alberts, MD Follow up.   Specialty:  Cardiothoracic Surgery Why:  Your appointment is on 07/15/2016 at 11:00am. Please  arrive at 10:30am for a chest xray at [facility] which is located on the first floor of our building.  Contact information: [address] [place] [address] [place], PA-C Follow up.   Specialties:  Cardiology, Radiology Why:  Your appointment is on 07/18/2016 at 10:30am. Please bring your medication list.  Contact information: [place] [address] [phone]        Ena Dawley, MD Follow up.   Specialty:  Cardiology Why:  Your INR appointment is on 07/08/2016 at 9:30am.  Contact information: [place] [address] [phone]          The patient has been discharged on:   1.Beta Blocker:  Yes [ x ]                              No   [   ]                              If No, reason:   2.Ace Inhibitor/ARB: Yes [   ]                                     No  [ x   ]                                     If No, reason: titration of BB  3.Statin:   Yes [   ]                  No  [ x  ]                  If No, reason: no CAD  4.Shela CommonsVelta Addison  [ x  ]                  No   [   ]                  If No, reason:    Signed: Elgie Collard 07/04/2016, 9:19 AM

## 2016-07-01 NOTE — Progress Notes (Signed)
Patient noted drainage from CT site where tube was pulled this morning.  I assessed and will reinforce the site as this is a new dressing.  Vale and made aware.  Patient in no distress. Pt resting with call bell within reach.  Will continue to monitor. Payton Emerald, RN

## 2016-07-01 NOTE — Progress Notes (Signed)
[address]       [place], [address]             [phone]     CARDIOTHORACIC SURGERY PROGRESS NOTE  4 Days Post-Op  S/P Procedure(s) (LRB): MINIMALLY INVASIVE MITRAL VALVE REPLACEMENT (Right) TRANSESOPHAGEAL ECHOCARDIOGRAM (TEE) (N/A)  Subjective: Feels like she is doing well. Anxious over low blood count and not wanting any blood transfusion. States she at times is dizzy when she first stands, but denies SOB, chest pain or palpitations. No BM, but passing flatus. Patient is anxious for discharge and wants to get home, asking about home health and wanting walker and bedside commode for home.  Objective: Vital signs in last 24 hours: Temp:  [98.3 F (36.8 C)-98.9 F (37.2 C)] 98.9 F (37.2 C) (02/12 0549) Pulse Rate:  [86-99] 96 (02/12 0549) Cardiac Rhythm: Junctional rhythm (02/11 1900) Resp:  [18] 18 (02/12 0549) BP: (91-112)/(43-61) 103/57 (02/12 0549) SpO2:  [93 %-98 %] 98 % (02/12 0549) Weight:  [58.1 kg (128 lb)] 58.1 kg (128 lb) (02/12 0549)  Physical Exam:  General:   Well appearing, alert and oriented  Breath sounds: Coarse breath sounds in right upper lung field. Bases CTAB.  Heart sounds:  Regular rate and rhythm. No murmur, gallop or rub.  Incisions:  Clean and dry  Abdomen:  Soft and nontender  Extremities:  Warm, no cyanosis or edema   Intake/Output from previous day: 02/11 0701 - 02/12 0700 In: 720 [P.O.:720] Out: 190 [Chest Tube:190] Intake/Output this shift: No intake/output data recorded.  Lab Results:  Recent Labs  06/30/16 0251 07/01/16 0425  WBC 7.8 6.2  HGB 7.0* 7.0*  HCT 21.4* 21.9*  PLT 85* 107*   BMET:  Recent Labs  06/29/16 0401 06/30/16 0251  NA 128* 128*  K 4.0 4.0  CL 90* 92*  CO2 29 29  GLUCOSE 112* 97  BUN 13 12  CREATININE 0.81 0.75  CALCIUM 8.4* 8.2*    CBG (last 3)   Recent Labs  06/30/16 0632 06/30/16 1155 06/30/16 2229  GLUCAP 99 101* 104*   PT/INR:   Recent Labs   07/01/16 0425  LABPROT 13.8  INR 1.05    CXR:   PORTABLE CHEST 1 VIEW  COMPARISON:  PA and lateral chest x-ray of June 30, 2016.  FINDINGS: The tiny right apical pneumothorax is not evident today. The apical chest tube on the right is stable. There is no infiltrate or significant pleural effusion on the right. On the left the retrocardiac region remains mildly dense. There is no significant left pleural effusion. There is stable left apical pleural thickening. The heart is normal in size. There is a prosthetic mitral valve ring in place. There is calcification in the wall of the aortic arch. The pulmonary vascularity is not engorged. The patient has undergone previous plate and screw fixation of a left clavicular fracture.  IMPRESSION: No right apical pneumothorax is observed today. No acute abnormality evident on the right. Stable right apical chest tube.  No CHF.  Minimal atelectasis or scarring at the left lung base.  Thoracic aortic atherosclerosis.   Electronically Signed   By: David  Martinique M.D.   On: 07/01/2016 07:33  Assessment/Plan: S/P Procedure(s) (LRB): MINIMALLY INVASIVE MITRAL VALVE REPLACEMENT (Right) TRANSESOPHAGEAL ECHOCARDIOGRAM (TEE) (N/A)  1 CV - NSR with some PVCs, EPWs removed yesterday. +hypotension. - not on BB, INR 1.05 Coumadin increased to 3 mg.  2 Pulm - CT with 190 cc  output, serosanguinous, CXR looks good this AM. D/C chest tubes and repeat CXR in AM. 3 Renal - Creatinine 0.75 yesterday, UOP good. Not on lasix 4 Expected post operative blood loss anemia - Hgb stable at 7.0 and patient is mildly symptomatic still. Does not want transfusion, but on iron supplement. Continue to monitor, may need home iron supplementation for a few weeks. 5 Petechial rash, face, legs - Plt up to 107, HIT antibody pending. Patient also states that rash is chronic. 6 Dispo - patient is stable, Hgb 7.0. D/C chest tubes. Patient interested in walker and  shower chair for home and home health. Put in consults for PT/OT and case management.  Brigid Re, PA-S 07/01/2016 7:39 AM Patient seen and examined, agree with above hgb low but stable- on iron and folate CT removed Had episode of hands going numb when bent over washing hair in sink, resolved. Probably positional, but she will let us know right away if it happens again  [place] C. Roxan Hockey, MD [facility] [phone]

## 2016-07-01 NOTE — Progress Notes (Signed)
Right chest tube removed per order. Vaseline gauze applied with dry gauze will continue to observe.

## 2016-07-01 NOTE — Care Management Important Message (Signed)
Important Message  Patient Details  Name: Joy Williams MRN: [id number] Date of Birth: [birth year]   Medicare Important Message Given:  Yes    Lenee Franze Montine Circle 07/01/2016, 4:13 PM

## 2016-07-01 NOTE — Progress Notes (Signed)
CARDIAC REHAB PHASE I   PRE:  Rate/Rhythm: 93 SR  Wide QRS  BP:  Supine:   Sitting: 97/51  Standing:    SaO2: 96%RA  MODE:  Ambulation: 550 ft   POST:  Rate/Rhythm: 110   BP:  Supine:   Sitting: 112/59  Standing:    SaO2: 94%RA 1440-1503 Pt walked 550 ft independently with rolling walker and tolerated well. Denied dizziness but stated she did fell a little SOB.   Graylon Good, RN BSN  07/01/2016 3:01 PM

## 2016-07-01 NOTE — Care Management Note (Signed)
Case Management Note Previous CM note initiated by Maryclare Labrador, Williams 06/28/2016, 9:50 AM    Patient Details  Name: Joy Williams MRN: [id number] Date of Birth: May 10, 1951  Subjective/Objective:  Pt is s/p MVR                  Action/Plan:   PTA independent form home with daughter.  Pt states she has access to PCP that she sees on a regular basis and denies barriers to obtaining medications.  CM will continue to follow for discharge needs   Expected Discharge Date:                  Expected Discharge Plan:  Home/Self Care  In-House Referral:     Discharge planning Services  CM Consult  Post Acute Care Choice:  Durable Medical Equipment Choice offered to:  Patient  DME Arranged:  3-N-1, Walker rolling DME Agency:  [place]:    [place] Agency:     Status of Service:  In process, will continue to follow  If discussed at Long Length of Stay Meetings, dates discussed:    Additional Comments:  07/01/16- 1500- Joy Williams, CM- referral for d/c needs and DME- spoke with pt at bedside- questions answered for what Medicare will cover as far as HH vs personal care services- pt will need to pay out of pocket for any personal care services unless she has a long term plan that may cover and does not have a wait period. Pt does not feel like she will need [place] services such as [facility]/PT that would be covered under Medicare- pt would like a RW and 3n1 for discharge- CM to f/u with pt in am regarding private duty needs and if pt would like a list of agencies.   Joy Client Santel, Williams 07/01/2016, 3:24 PM [phone]

## 2016-07-02 ENCOUNTER — Inpatient Hospital Stay (HOSPITAL_COMMUNITY): Payer: Medicare Other

## 2016-07-02 LAB — CBC
HEMATOCRIT: 24.9 % — AB (ref 36.0–46.0)
Hemoglobin: 8 g/dL — ABNORMAL LOW (ref 12.0–15.0)
MCH: 27.9 pg (ref 26.0–34.0)
MCHC: 32.1 g/dL (ref 30.0–36.0)
MCV: 86.8 fL (ref 78.0–100.0)
Platelets: 144 10*3/uL — ABNORMAL LOW (ref 150–400)
RBC: 2.87 MIL/uL — AB (ref 3.87–5.11)
RDW: 13.8 % (ref 11.5–15.5)
WBC: 7.8 10*3/uL (ref 4.0–10.5)

## 2016-07-02 LAB — GLUCOSE, CAPILLARY
GLUCOSE-CAPILLARY: 102 mg/dL — AB (ref 65–99)
GLUCOSE-CAPILLARY: 108 mg/dL — AB (ref 65–99)
Glucose-Capillary: 95 mg/dL (ref 65–99)
Glucose-Capillary: 74 mg/dL (ref 65–99)

## 2016-07-02 LAB — PROTIME-INR
INR: 1.03
Prothrombin Time: 13.5 seconds (ref 11.4–15.2)

## 2016-07-02 MED ORDER — WARFARIN SODIUM 5 MG PO TABS
5.0000 mg | ORAL_TABLET | Freq: Every day | ORAL | Status: DC
  Administered 2016-07-02: 5 mg via ORAL
  Filled 2016-07-02: qty 1

## 2016-07-02 NOTE — Evaluation (Signed)
Occupational Therapy Evaluation Patient Details Name: Joy Williams MRN: [id number] DOB: Apr 25, 1951 Today's Date: 07/02/2016    History of Present Illness 66 yo admitted for mini MVR. PMHx: mitral regurgitation, lung CA s/p wedge resection x 2, COPD, Raynaud's disease   Clinical Impression   Patient evaluated by Occupational Therapy with no further acute OT needs identified. All education has been completed and the patient has no further questions. See below for any follow-up Occupational Therapy or equipment needs. OT to sign off. Thank you for referral.      Follow Up Recommendations  No OT follow up    Equipment Recommendations  None recommended by OT    Recommendations for Other Services       Precautions / Restrictions Precautions Precaution Comments: sternal precautions      Mobility Bed Mobility               General bed mobility comments: in chair on arrival  Transfers Overall transfer level: Independent                    Balance                                            ADL Overall ADL's : Independent                                       General ADL Comments: pt educated on adls with sternal precautions, using pillow to cough, provided energy conservation handout, and provided 3n1 transfer setup form for home.      Vision     Perception     Praxis      Pertinent Vitals/Pain Pain Assessment: No/denies pain (soreness mainly when she coughs)     Hand Dominance Right   Extremity/Trunk Assessment Upper Extremity Assessment Upper Extremity Assessment: Overall WFL for tasks assessed   Lower Extremity Assessment Lower Extremity Assessment: Defer to PT evaluation   Cervical / Trunk Assessment Cervical / Trunk Assessment: Normal   Communication Communication Communication: No difficulties   Cognition Arousal/Alertness: Awake/alert Behavior During Therapy: WFL for tasks  assessed/performed Overall Cognitive Status: Within Functional Limits for tasks assessed                     General Comments       Exercises       Shoulder Instructions      Home Living Family/patient expects to be discharged to:: Private residence Living Arrangements: Children (daughter stephanie will be PRN) Available Help at Discharge: Family;Available PRN/intermittently Type of Home: House Home Access: Stairs to enter Entrance Stairs-Number of Steps: 3   Home Layout: Two level;Bed/bath upstairs Alternate Level Stairs-Number of Steps: 20 Alternate Level Stairs-Rails: Right Bathroom Shower/Tub: Teacher, early years/pre: Standard     Home Equipment: None   Additional Comments: pt has ordered 3n1 off [employer] for home      Prior Functioning/Environment Level of Independence: Independent                 OT Problem List:     OT Treatment/Interventions:      OT Goals(Current goals can be found in the care plan section)    OT Frequency:     Barriers to D/C:  Co-evaluation              End of Session Nurse Communication: Mobility status;Precautions  Activity Tolerance: Patient tolerated treatment well Patient left: in chair;with call bell/phone within reach   Time: 1447-1514 OT Time Calculation (min): 27 min Charges:  OT General Charges $OT Visit: 1 Procedure OT Evaluation $OT Eval Moderate Complexity: 1 Procedure G-Codes:    Peri Maris 07-24-16, 3:22 PM   Jeri Modena   OTR/L Pager: [phone] Office: [phone] .

## 2016-07-02 NOTE — Progress Notes (Signed)
CARDIAC REHAB PHASE I   PRE:  Rate/Rhythm: 82 first deg    BP: sitting 91/64    SaO2: 94 RA  MODE:  Ambulation: 740 ft   POST:  Rate/Rhythm: 107     BP: sitting 96/80     SaO2: wouldn't register (Raynaud's)  Pt moving well, independently. Used RW for security. Rest x3 for DOE. To recliner. Encouraged more walking and IS. She is inspiring 1000 mL. [id number]   [place], ACSM 07/02/2016 11:48 AM

## 2016-07-02 NOTE — Progress Notes (Signed)
[address]       [place] [address]             [phone]     CARDIOTHORACIC SURGERY PROGRESS NOTE  5 Days Post-Op  S/P Procedure(s) (LRB): MINIMALLY INVASIVE MITRAL VALVE REPLACEMENT (Right) TRANSESOPHAGEAL ECHOCARDIOGRAM (TEE) (N/A)  Subjective: Feels well, still some soreness on the right side with deep breaths. Not getting as dizzy with standing, denies SOB, palpitations, or chest pain. Worked well with PT yesterday. Spoke with case management and concerned about how to get someone to her house to stay with her while her daughter is working. Feels hungry, wants to know if someone could bring her some food. +BM yesterday. Patient reports some drainage at chest tube site with coughing or turning, discussed that this was normal.   Objective: Vital signs in last 24 hours: Temp:  [98.3 F (36.8 C)-98.6 F (37 C)] 98.5 F (36.9 C) (02/13 0411) Pulse Rate:  [83-99] 85 (02/13 0411) Cardiac Rhythm: Heart block;Bundle branch block (02/12 1910) Resp:  [18] 18 (02/13 0411) BP: (111-123)/(50-77) 111/50 (02/13 0411) SpO2:  [96 %-97 %] 96 % (02/13 0411) Weight:  [58.9 kg (129 lb 12.8 oz)] 58.9 kg (129 lb 12.8 oz) (02/13 0411)  Physical Exam:  General:   Well appearing, alert and oriented  Breath sounds: CTAB  Heart sounds:  Regular rate and rhythm. No murmur, gallop or rub.  Incisions:  Clean and dry.  Abdomen:  Soft and nontender  Extremities:  Warm and without edema or cyanosis.   Intake/Output from previous day: 02/12 0701 - 02/13 0700 In: 840 [P.O.:840] Out: 100 [Chest Tube:100] Intake/Output this shift: No intake/output data recorded.  Lab Results:  Recent Labs  06/30/16 0251 07/01/16 0425  WBC 7.8 6.2  HGB 7.0* 7.0*  HCT 21.4* 21.9*  PLT 85* 107*   BMET:  Recent Labs  06/30/16 0251  NA 128*  K 4.0  CL 92*  CO2 29  GLUCOSE 97  BUN 12  CREATININE 0.75  CALCIUM 8.2*    CBG (last 3)   Recent Labs  07/01/16 1708 07/01/16 2158  07/02/16 0603  GLUCAP 116* 117* 95   PT/INR:   Recent Labs  07/02/16 0247  LABPROT 13.5  INR 1.03     CXR:   CHEST  2 VIEW  COMPARISON:  Radiograph of July 01, 2016.  FINDINGS: Stable cardiomediastinal silhouette. Atherosclerosis of thoracic aorta is noted. Postsurgical changes are noted in the left upper lobe. Status post cardiac valve repair. Status post surgical internal fixation of old left clavicular fracture. Right-sided chest tube has been removed. Minimal right apical pneumothorax is now noted. Minimal right basilar atelectasis and pleural effusion is noted.  IMPRESSION: Right-sided chest tube has been removed. Minimal right apical pneumothorax is now noted. Minimal right basilar subsegmental atelectasis and effusion is noted. These results will be called to the ordering clinician or representative by the Radiologist Assistant, and communication documented in the PACS or zVision Dashboard.   Electronically Signed   By: Marijo Conception, M.D.   On: 07/02/2016 07:27  Assessment/Plan: S/P Procedure(s) (LRB): MINIMALLY INVASIVE MITRAL VALVE REPLACEMENT (Right) TRANSESOPHAGEAL ECHOCARDIOGRAM (TEE) (N/A)  1 CV - NSR with one episode 1st degree AVB overnight. BP has improved some, patient is not on a BB. INR dropped slightly from yesterday to 1.03, increased Coumadin dose to 5 mg. 2 Pulm - CT removed, some drainage with coughing and turning. CXR shows small right apical PTX, patient denies  SOB and O2 sat good. Repeat CXR tomorrow AM. 3 Expected blood loss anemia - Hgb was stable yesterday and patient's symptoms have improved. On iron and folate. Repeat CBC. 4 Dispo - patient is stable. INR needs to come up more. Patient saw PT and did well. Discussed with patient that it would be okay for her to be on her own at home a few hours a day while daughter is working as long as she can call her daughter if needed and she has someone there at night. Patient was  agreeable to this.   Brigid Re, PA-S 07/02/2016 7:38 AM  Patient seen and examined, remains anxious Progressing well  Remo Lipps C. Roxan Hockey, MD [facility] [phone]

## 2016-07-03 ENCOUNTER — Inpatient Hospital Stay (HOSPITAL_COMMUNITY): Payer: Medicare Other

## 2016-07-03 LAB — GLUCOSE, CAPILLARY: GLUCOSE-CAPILLARY: 86 mg/dL (ref 65–99)

## 2016-07-03 LAB — PROTIME-INR
INR: 1.06
PROTHROMBIN TIME: 13.9 s (ref 11.4–15.2)

## 2016-07-03 MED ORDER — WARFARIN SODIUM 7.5 MG PO TABS
7.5000 mg | ORAL_TABLET | Freq: Every day | ORAL | Status: DC
  Administered 2016-07-03: 7.5 mg via ORAL
  Filled 2016-07-03: qty 1

## 2016-07-03 NOTE — Progress Notes (Signed)
CARDIAC REHAB PHASE I   PRE:  Rate/Rhythm: 83 SR  BP:  Sitting: 107/47        SaO2: 90 RA  MODE:  Ambulation: 500 ft   POST:  Rate/Rhythm: 104 ST  BP:  Sitting: 117/41         SaO2: 99 RA  Pt ambulated 500 ft on RA, rolling walker, slow, steady gait, tolerated well. Pt c/o some DOE, able to walk and talk comfortably. Encouraged additional ambulation x2 today.  Pt to chair after walk, call bell within reach. Will follow.  [phone] Lenna Sciara, RN, BSN 07/03/2016 10:36 AM

## 2016-07-03 NOTE — Progress Notes (Addendum)
      [address]       ,[place] [address]             [phone]      6 Days Post-Op Procedure(s) (LRB): MINIMALLY INVASIVE MITRAL VALVE REPLACEMENT (Right) TRANSESOPHAGEAL ECHOCARDIOGRAM (TEE) (N/A) Subjective: No issues overnight. She is anxious about going home soon.   Objective: Vital signs in last 24 hours: Temp:  [98.2 F (36.8 C)-98.7 F (37.1 C)] 98.2 F (36.8 C) (02/14 0655) Pulse Rate:  [82-88] 82 (02/14 0655) Cardiac Rhythm: Normal sinus rhythm;Heart block (02/14 0700) Resp:  [18-20] 18 (02/14 0655) BP: (114-123)/(46-66) 114/46 (02/14 0655) SpO2:  [92 %-100 %] 95 % (02/14 0655) Weight:  [59.1 kg (130 lb 6.4 oz)] 59.1 kg (130 lb 6.4 oz) (02/14 0234)     Intake/Output from previous day: 02/13 0701 - 02/14 0700 In: 960 [P.O.:960] Out: -  Intake/Output this shift: No intake/output data recorded.    General: Well appearing, alert and oriented   Breath sounds: CTAB   Heart sounds: Regular rate and rhythm. No murmur,   gallop or rub.    Incisions: Clean and dry.    Abdomen:  Soft and nontender    Extremities: Warm and without edema or cyanosis.  Lab Results:  Recent Labs  07/01/16 0425 07/02/16 1253  WBC 6.2 7.8  HGB 7.0* 8.0*  HCT 21.9* 24.9*  PLT 107* 144*   BMET: No results for input(s): NA, K, CL, CO2, GLUCOSE, BUN, CREATININE, CALCIUM in the last 72 hours.  PT/INR:  Recent Labs  07/03/16 0223  LABPROT 13.9  INR 1.06   ABG    Component Value Date/Time   PHART 7.408 06/28/2016 0402   HCO3 27.3 06/28/2016 0402   TCO2 27 06/28/2016 1723   ACIDBASEDEF 1.0 06/27/2016 1436   O2SAT 97.0 06/28/2016 0402   CBG (last 3)   Recent Labs  07/02/16 1638 07/02/16 2139 07/03/16 0607  GLUCAP 102* 108* 86    Assessment/Plan: S/P Procedure(s) (LRB): MINIMALLY INVASIVE MITRAL VALVE REPLACEMENT (Right) TRANSESOPHAGEAL ECHOCARDIOGRAM (TEE) (N/A)  1. CV-NSR in the 80s with 1st degree AVB this am. BP okay. Continue to hold Bb.  INR did not bump, 1.06 today. Will increase to 7.'5mg'$  Coumadin and draw again tomorrow.  2. Pulm-CXR showed minimal right apical pneum. Unchanged from yesterdays study. Await official read. Patient is tolerating room air. 3. Expected blood loss anemia-H and H 8.0/24.9 today and trending up. Platelets improved at 144k. On iron and folate. 4. Plan: Patient stable. She will need a bump in her INR before home. Patient is okay to go home with family. PT/OT seeing and treating-recommended a walker.    LOS: 6 days    Elgie Collard 07/03/2016 Patient seen and examined, agree with above  Remo Lipps C. Roxan Hockey, MD [facility] [phone]

## 2016-07-04 LAB — PROTIME-INR
INR: 1.13
PROTHROMBIN TIME: 14.5 s (ref 11.4–15.2)

## 2016-07-04 MED ORDER — WARFARIN SODIUM 10 MG PO TABS: 10.0000 mg | ORAL_TABLET | Freq: Every day | ORAL | Status: DC

## 2016-07-04 MED ORDER — METOPROLOL TARTRATE 12.5 MG HALF TABLET
12.5000 mg | ORAL_TABLET | Freq: Two times a day (BID) | ORAL | Status: DC
  Administered 2016-07-04: 12.5 mg via ORAL
  Filled 2016-07-04: qty 1

## 2016-07-04 MED ORDER — WARFARIN SODIUM 5 MG PO TABS: 5.0000 mg | ORAL_TABLET | Freq: Every day | ORAL | 1 refills | Status: DC

## 2016-07-04 MED ORDER — WARFARIN SODIUM 5 MG PO TABS: 5.0000 mg | ORAL_TABLET | Freq: Every day | ORAL | Status: DC

## 2016-07-04 MED ORDER — METOPROLOL TARTRATE 25 MG PO TABS: 12.5000 mg | ORAL_TABLET | Freq: Two times a day (BID) | ORAL | 1 refills | Status: DC

## 2016-07-04 MED ORDER — FA-PYRIDOXINE-CYANOCOBALAMIN 2.5-25-2 MG PO TABS: 1.0000 | ORAL_TABLET | Freq: Every day | ORAL | 1 refills | Status: DC

## 2016-07-04 MED ORDER — TRAMADOL HCL 50 MG PO TABS: 50.0000 mg | ORAL_TABLET | Freq: Four times a day (QID) | ORAL | 0 refills | Status: DC | PRN

## 2016-07-04 MED ORDER — POLYSACCHARIDE IRON COMPLEX 150 MG PO CAPS: 150.0000 mg | ORAL_CAPSULE | Freq: Every day | ORAL | 1 refills | Status: DC

## 2016-07-04 NOTE — Care Management Note (Signed)
Case Management Note Previous CM note initiated by Maryclare Labrador, RN 06/28/2016, 9:50 AM    Patient Details  Name: Joy Williams MRN: [id number] Date of Birth: 1951/04/23  Subjective/Objective:  Pt is s/p MVR                  Action/Plan:   PTA independent form home with daughter.  Pt states she has access to PCP that she sees on a regular basis and denies barriers to obtaining medications.  CM will continue to follow for discharge needs   Expected Discharge Date:  07/04/16               Expected Discharge Plan:  Home/Self Care  In-House Referral:     Discharge planning Services  CM Consult  Post Acute Care Choice:  Durable Medical Equipment Choice offered to:  Patient  DME Arranged:  3-N-1, Walker rolling DME Agency:  [place]:  NA [place] Agency:  NA  Status of Service:  Completed, signed off  If discussed at [place] of Stay Meetings, dates discussed:  2/13, 2/15  Discharge Disposition: home/self care   Additional Comments:  07/04/16- 1000- Marvetta Gibbons RN, CM- pt for d/c home today, per PT eval no f/u services recommended- pt to return home with daughter, f/u done with pt and she does want RW and 3n1 for home- orders have been placed- notified [place] for DME needs- RW and 3n1 to be delivered to room prior to discharge.   07/01/16- 1500- Marliyah Reid RN, CM- referral for d/c needs and DME- spoke with pt at bedside- questions answered for what Medicare will cover as far as HH vs personal care services- pt will need to pay out of pocket for any personal care services unless she has a long term plan that may cover and does not have a wait period. Pt does not feel like she will need [place] services such as RN/PT that would be covered under Medicare- pt would like a RW and 3n1 for discharge- CM to f/u with pt in am regarding private duty needs and if pt would like a list of agencies.   [name] [place], RN 07/04/2016, 10:23 AM [phone]

## 2016-07-04 NOTE — Progress Notes (Addendum)
Patient in a a stable condition, discharge education completed with patient she verbalised understanding, iv removed, tele dc ccmd notified patient belonging at bedside, patient taken off the unit by a hospital volunteer and a NT. patient's daughter to drive patient home.

## 2016-07-04 NOTE — Progress Notes (Addendum)
      [address]       [place] [address]             [phone]      7 Days Post-Op Procedure(s) (LRB): MINIMALLY INVASIVE MITRAL VALVE REPLACEMENT (Right) TRANSESOPHAGEAL ECHOCARDIOGRAM (TEE) (N/A) Subjective: Has been moving around without issue. No pain this morning.   Objective: Vital signs in last 24 hours: Temp:  [98.4 F (36.9 C)-98.6 F (37 C)] 98.4 F (36.9 C) (02/15 0603) Pulse Rate:  [86-94] 94 (02/15 0603) Cardiac Rhythm: Normal sinus rhythm;Heart block (02/15 0700) Resp:  [18-19] 18 (02/15 0603) BP: (101-113)/(48-52) 101/52 (02/15 0603) SpO2:  [96 %-100 %] 96 % (02/15 0603) Weight:  [58.2 kg (128 lb 3.2 oz)] 58.2 kg (128 lb 3.2 oz) (02/15 0603)     Intake/Output from previous day: 02/14 0701 - 02/15 0700 In: 600 [P.O.:600] Out: -  Intake/Output this shift: No intake/output data recorded.  General: Well appearing, alert and oriented Breath sounds:CTAB Heart sounds: Regular rate and rhythm. No murmur,   gallop or rub. Incisions: Clean and dry. Abdomen:Soft and nontender Extremities:Warm and without edema or cyanosis.  Lab Results:  Recent Labs  07/02/16 1253  WBC 7.8  HGB 8.0*  HCT 24.9*  PLT 144*   BMET: No results for input(s): NA, K, CL, CO2, GLUCOSE, BUN, CREATININE, CALCIUM in the last 72 hours.  PT/INR:  Recent Labs  07/04/16 0233  LABPROT 14.5  INR 1.13   ABG    Component Value Date/Time   PHART 7.408 06/28/2016 0402   HCO3 27.3 06/28/2016 0402   TCO2 27 06/28/2016 1723   ACIDBASEDEF 1.0 06/27/2016 1436   O2SAT 97.0 06/28/2016 0402   CBG (last 3)   Recent Labs  07/02/16 1638 07/02/16 2139 07/03/16 0607  GLUCAP 102* 108* 86    Assessment/Plan: S/P Procedure(s) (LRB): MINIMALLY INVASIVE MITRAL VALVE REPLACEMENT (Right) TRANSESOPHAGEAL ECHOCARDIOGRAM (TEE) (N/A)  1. CV-NSR in the 80s with 1st degree AVB this am. BP okay. Continue to hold Bb. INR did not bump, 1.13 today. Will increase to  '10mg'$  Coumadin and draw again tomorrow.  2. Pulm-CXR showed minimal right apical pneum. Unchanged from yesterdays study.  Patient is tolerating room air and able to ambulate with assistance of a walker 3. Expected blood loss anemia-H and H 8.0/24.9 yesterday and trending up. Platelets improved at 144k. On iron and folate. 4. Plan: Patient stable. She will need a bump in her INR before home. Patient is okay to go home with family. PT/OT seeing and treating-recommended a walker.     LOS: 7 days   Brigid Re, PA-S Elgie Collard 07/04/2016   I have seen and examined the patient and agree with the assessment and plan as outlined with exception to the fact that she can be discharged home safely with plans to have coumadin dosing managed as an outpatient through the coumadin clinic.  She remains in sinus rhythm.  D/C home today on Coumadin 5 mg/day with plans to check INR on Monday.  Rexene Alberts, MD 07/04/2016 8:53 AM

## 2016-07-04 NOTE — Progress Notes (Signed)
Ed completed with pt. She deflected discussing smoking cessation and declined a fake cigarette. I discussed with her to risks of vaping as well. She is interested in [place] and I will send referral to [place]. Set up Coumadin d/c video.  [phone] Yves Dill CES, ACSM 10:33 AM 07/04/2016

## 2016-07-04 NOTE — Care Management Important Message (Signed)
Important Message  Patient Details  Name: Joy Williams MRN: [id number] Date of Birth: 1951-05-02   Medicare Important Message Given:  Yes    Nathen May 07/04/2016, 11:58 AM

## 2016-07-05 LAB — TYPE AND SCREEN
BLOOD PRODUCT EXPIRATION DATE: 201803022359
Blood Product Expiration Date: 201803012359
ISSUE DATE / TIME: 201802080812
ISSUE DATE / TIME: 201802080812
UNIT TYPE AND RH: 7300
Unit Type and Rh: 7300

## 2016-07-08 ENCOUNTER — Ambulatory Visit (INDEPENDENT_AMBULATORY_CARE_PROVIDER_SITE_OTHER): Payer: Medicare Other | Admitting: *Deleted

## 2016-07-08 DIAGNOSIS — Z5181 Encounter for therapeutic drug level monitoring: Secondary | ICD-10-CM

## 2016-07-08 DIAGNOSIS — Z953 Presence of xenogenic heart valve: Secondary | ICD-10-CM | POA: Diagnosis not present

## 2016-07-08 LAB — POCT INR: INR: 1.5

## 2016-07-12 ENCOUNTER — Other Ambulatory Visit: Payer: Self-pay | Admitting: *Deleted

## 2016-07-12 DIAGNOSIS — Z9889 Other specified postprocedural states: Secondary | ICD-10-CM

## 2016-07-15 ENCOUNTER — Ambulatory Visit (INDEPENDENT_AMBULATORY_CARE_PROVIDER_SITE_OTHER): Payer: Medicare Other | Admitting: *Deleted

## 2016-07-15 ENCOUNTER — Ambulatory Visit
Admission: RE | Admit: 2016-07-15 | Discharge: 2016-07-15 | Disposition: A | Discharge status: Home / Self Care | Payer: Medicare Other | Source: Ambulatory Visit | Attending: Thoracic Surgery (Cardiothoracic Vascular Surgery) | Admitting: Thoracic Surgery (Cardiothoracic Vascular Surgery)

## 2016-07-15 ENCOUNTER — Encounter: Payer: Self-pay | Admitting: Thoracic Surgery (Cardiothoracic Vascular Surgery)

## 2016-07-15 ENCOUNTER — Ambulatory Visit (INDEPENDENT_AMBULATORY_CARE_PROVIDER_SITE_OTHER): Payer: Self-pay | Admitting: Thoracic Surgery (Cardiothoracic Vascular Surgery)

## 2016-07-15 VITALS — BP 107/52 | HR 75 | Resp 20 | Ht 67.0 in | Wt 130.0 lb

## 2016-07-15 DIAGNOSIS — Z953 Presence of xenogenic heart valve: Secondary | ICD-10-CM

## 2016-07-15 DIAGNOSIS — Z5181 Encounter for therapeutic drug level monitoring: Secondary | ICD-10-CM

## 2016-07-15 DIAGNOSIS — R05 Cough: Secondary | ICD-10-CM | POA: Diagnosis not present

## 2016-07-15 DIAGNOSIS — R0602 Shortness of breath: Secondary | ICD-10-CM | POA: Diagnosis not present

## 2016-07-15 DIAGNOSIS — Z9889 Other specified postprocedural states: Secondary | ICD-10-CM

## 2016-07-15 DIAGNOSIS — I340 Nonrheumatic mitral (valve) insufficiency: Secondary | ICD-10-CM

## 2016-07-15 LAB — POCT INR: INR: 1.2

## 2016-07-15 NOTE — Progress Notes (Signed)
[address]       [place] [address]             [phone]     CARDIOTHORACIC SURGERY OFFICE NOTE  Referring Provider is Dorothy Spark, MD PCP is Velna Hatchet, MD   HPI:  Patient is a 66 year old female with history of mitral valve prolapse with mitral regurgitation, lung cancer with synchronous primary tumors, COPD, and Raynaud's disease who returns to the office today for routine follow-up status post minimally invasive mitral valve replacement using a bioprosthetic tissue valve on 06/27/2016. The patient's early postoperative recovery was uneventful and she was discharged home on the seventh postoperative day.  Since hospital discharge she has had her prothrombin time checked and Coumadin dose adjusted through the Coumadin clinic. Her last INR was subtherapeutic at 1.5 on 07/08/2016. She has not yet been seen in follow-up at [facility] but she returns to our office for routine follow-up today.  She reports that overall she is making slow but steady progress. Her biggest complaint is still that she feels tired and she gets tired easily with activity.  She wonders if this may be related to her metoprolol. She recently cut back to taking only 12.5 mg once daily. She still has mild soreness in her chest but this continues to gradually improve. She denies any shortness of breath. She has not had palpitations or dizzy spells.   Current Outpatient Prescriptions  Medication Sig Dispense Refill  . aspirin EC 81 MG tablet Take 81 mg by mouth daily.    . COCONUT OIL PO Take 15 mLs by mouth See admin instructions. Daily with coffee    . DULERA 100-5 MCG/ACT AERO INHALE 2 PUFFS BY MOUTH FIRST THING IN THE MORNING AND 2 PUFFS 12 HOURS LATER 1 Inhaler 0  . folic acid-pyridoxine-cyancobalamin (FOLTX) 2.5-25-2 MG TABS tablet Take 1 tablet by mouth daily. 30 each 1  . iron polysaccharides (NIFEREX) 150 MG capsule Take 1 capsule (150 mg total) by mouth daily. 30 capsule  1  . levothyroxine (SYNTHROID, LEVOTHROID) 175 MCG tablet Take 175 mcg by mouth daily before breakfast.    . metoprolol tartrate (LOPRESSOR) 25 MG tablet Take 0.5 tablets (12.5 mg total) by mouth 2 (two) times daily. 30 tablet 1  . Multiple Vitamin (MULTIVITAMIN) LIQD Take 30 mLs by mouth daily. VM-100 COMPLETE MULTIVITAMIN    . traMADol (ULTRAM) 50 MG tablet Take 1 tablet (50 mg total) by mouth every 6 (six) hours as needed for moderate pain. 30 tablet 0  . warfarin (COUMADIN) 5 MG tablet Take 1 tablet (5 mg total) by mouth daily at 6 PM. 30 tablet 1   No current facility-administered medications for this visit.       Physical Exam:   BP (!) 107/52   Pulse 75   Resp 20   Ht '5\' 7"'$  (1.702 m)   Wt 130 lb (59 kg)   SpO2 96% Comment: RA  BMI 20.36 kg/m   General:  Well-appearing  Chest:   Clear to auscultation  CV:   Regular rate and rhythm without murmur  Incisions:  Healing nicely  Abdomen:  Soft nontender  Extremities:  Warm and well-perfused with no lower extremity edema  Diagnostic Tests:  CHEST  2 VIEW  COMPARISON:  July 03, 2016  FINDINGS: There is postoperative change in the left upper lobe region with scarring. There is also scarring in the left mid lung region medially. There is mild atelectatic  change in the lateral right base. There is no parenchymal lung edema or consolidation. Heart is upper normal in size with pulmonary vascularity within normal limits. Patient is status post mitral valve replacement. There is surrounding mitral annular calcification. There is aortic atherosclerosis. No adenopathy. Postoperative change noted in left clavicle.  IMPRESSION: Areas of postoperative change and scarring in the left lung. Slight lateral right base atelectasis. No edema or consolidation. Stable cardiac silhouette. Aortic atherosclerosis. Status post mitral valve replacement.   Electronically Signed   By: Lowella Grip III M.D.   On: 07/15/2016  10:44   Impression:  Patient is doing well approximately 4 weeks status post minimally invasive mitral valve replacement using a bioprosthetic tissue valve.  Plan:  I have encouraged the patient to continue to gradually increase her physical activity as tolerated. I suggested that she start taking metoprolol at bedtime instead of in the morning to see if this makes a difference in her exercise tolerance and energy level. We have not recommended any other changes to her current medications. I've encouraged her to participate in outpatient cardiac rehabilitation program. All of her questions have been addressed. The patient will return to our office in approximately 2 months. She will call and return sooner should specific problems or questions arise.    Valentina Gu. Roxy Manns, MD 07/15/2016 10:55 AM

## 2016-07-15 NOTE — Patient Instructions (Signed)
Start taking your metoprolol at bedtime instead of in the morning  Continue all other previous medications without any changes at this time  You may continue to gradually increase your physical activity as tolerated.  Refrain from any heavy lifting or strenuous use of your arms and shoulders until at least 8 weeks from the time of your surgery, and avoid activities that cause increased pain in your chest on the side of your surgical incision.  Otherwise you may continue to increase activities without any particular limitations.  Increase the intensity and duration of physical activity gradually.  You may return to driving an automobile as long as you are no longer requiring oral narcotic pain relievers during the daytime.  It would be wise to start driving only short distances during the daylight and gradually increase from there as you feel comfortable.  You are encouraged to enroll and participate in the outpatient cardiac rehab program beginning as soon as practical.

## 2016-07-18 ENCOUNTER — Encounter: Payer: Self-pay | Admitting: Cardiology

## 2016-07-18 ENCOUNTER — Ambulatory Visit (INDEPENDENT_AMBULATORY_CARE_PROVIDER_SITE_OTHER): Payer: Medicare Other | Admitting: Cardiology

## 2016-07-18 VITALS — BP 118/78 | HR 90 | Ht 67.0 in | Wt 131.0 lb

## 2016-07-18 DIAGNOSIS — Z952 Presence of prosthetic heart valve: Secondary | ICD-10-CM

## 2016-07-18 NOTE — Progress Notes (Signed)
07/18/2016 Joy Williams   [birth year]  [id number]  Primary Physician Velna Hatchet, MD Primary Cardiologist: Dr. 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 Roxy Manns, on 06/27/16. Her post operative course was uncomplicated. She was placed on coumadin. She is followed in our office by Dr. Meda Coffee. We also follow her INRs. Prior to undergoing valve surgery, she had a diagnostic LHC that showed normal coronaries. Her lung CA is followed at [facility]. Her most recent follow-up chest CT scan was performed in March 2017 at which time there was no sign of locally recurrent disease.  She presents back to clinic for post hospital f/u. She is here with her daughter. She reports that she has done fairly well. No chest pain. No significant dyspnea. She gets a little fatigue if she tries to do too much. After her hospitalization, she was having issues with low BP and dizziness, however Dr. Roxy Manns decrease her metoprolol from BID to once daily. She takes it at night and this has helped her. Her BP is stable in clinic today. She is w/o symptoms. EKG shows NSR. Dr. Roxy Manns has cleared her to resume driving and to start cardiac rehab.   Current Meds  Medication Sig  . aspirin EC 81 MG tablet Take 81 mg by mouth daily.  . COCONUT OIL PO Take 15 mLs by mouth See admin instructions. Daily with coffee  . DULERA 100-5 MCG/ACT AERO INHALE 2 PUFFS BY MOUTH  FIRST THING IN THE MORNING AND 2 PUFFS 12 HOURS LATER  . folic acid-pyridoxine-cyancobalamin (FOLTX) 2.5-25-2 MG TABS tablet Take 1 tablet by mouth daily.  . iron polysaccharides (NIFEREX) 150 MG capsule Take 1 capsule (150 mg total) by mouth daily.  Marland Kitchen levothyroxine (SYNTHROID, LEVOTHROID) 175 MCG tablet Take 175 mcg by mouth daily before breakfast.  . metoprolol tartrate (LOPRESSOR) 25 MG tablet Takes 1/2 tablet daily  . Multiple Vitamin (MULTIVITAMIN) LIQD Take 30 mLs by mouth daily. VM-100 COMPLETE MULTIVITAMIN  . traMADol (ULTRAM) 50 MG tablet Take 1 tablet (50 mg total) by mouth every 6 (six) hours as needed for moderate pain.  Marland Kitchen warfarin (COUMADIN) 5 MG tablet Take 1 tablet (5 mg total) by mouth daily at 6 PM. (Patient taking differently: Take 5 mg by mouth daily at 6 PM. Patient is taking 1.5 tablets this week only)   Allergies  Allergen Reactions  . Penicillins Hives    Unknown, occurred as a child Has patient had a PCN reaction causing immediate rash, facial/tongue/throat swelling, SOB or lightheadedness with hypotension: Yes Has patient had a PCN reaction causing severe rash involving mucus membranes or skin necrosis: No Has patient had a PCN reaction that required hospitalization  Has patient had a PCN reaction occurring within the last 10 years: No If all of the above answers are "NO", then may proceed with Cephalosporin use. No   Past Medical History:  Diagnosis Date  . Allergy   . Asthma    pt denies this, but is on Dulera  . Chronic  diastolic congestive heart failure ([place])   . 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 COPD (chronic obstructive pulmonary disease) ([place])   . Family history of adverse reaction to anesthesia    Mother- very sensitive to medication  . Heart murmur    MVP  . Hypothyroidism   . Lung cancer ([place]) 12/30/2014   Synchronous primaries:  T2aN0  2.8 cm adenoCA LLL and 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, each treated by wedge resection with post-op adjuvant [facility]Rx at [facility]  . MVP (mitral valve prolapse)   . PONV (postoperative nausea and vomiting)   . Raynaud's disease    Raynaud's  . Raynaud's syndrome   . 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   31 mm [facility] mitral bovine bioprosthetic tissue valve placed via right mini thoracotomy approach  . Severe mitral regurgitation 11/15/2014  . Shortness of breath dyspnea    with exertion  . Telangiectasia   . Thyroid disease    Family History  Problem Relation Age of Onset  . Mitral valve prolapse Mother   . Dementia Father   . Prostate cancer Father   . Mitral valve prolapse Brother   . Mitral valve prolapse Sister   . Colon cancer Neg Hx   . Colon polyps Neg Hx   . Rectal cancer Neg Hx   . Esophageal cancer Neg Hx   . Stomach cancer Neg Hx   . Heart attack Neg Hx    Past Surgical History:  Procedure Laterality Date  . BACK SURGERY     x 3  Disectomy  . CARDIAC CATHETERIZATION N/A 03/08/2016   Procedure: Right/Left Heart Cath and Coronary Angiography;  Surgeon: Sherren Mocha, MD;  Location: [place] [facility];  Service: Cardiovascular;  Laterality: N/A;  . CLAVICLE SURGERY Left 2013   plate to left collar bone  . LAPAROSCOPY     ? reason-age 58   . MITRAL VALVE REPAIR Right 06/27/2016   Procedure: MINIMALLY INVASIVE MITRAL VALVE REPLACEMENT;  Surgeon: Rexene Alberts, MD;  Location: [place];  Service: Open Heart Surgery;  Laterality: Right;  . TEE WITHOUT CARDIOVERSION N/A 02/22/2016   Procedure: TRANSESOPHAGEAL ECHOCARDIOGRAM (TEE);  Surgeon: Dorothy Spark, MD;  Location: [place];  Service: Cardiovascular;  Laterality: N/A;  . TEE WITHOUT CARDIOVERSION N/A 06/27/2016   Procedure: TRANSESOPHAGEAL ECHOCARDIOGRAM (TEE);  Surgeon: Rexene Alberts, MD;  Location: [place];  Service: Open Heart Surgery;  Laterality: N/A;  . TONSILLECTOMY      . VIDEO ASSISTED THORACOSCOPY (VATS)/WEDGE RESECTION Left 12/30/2014   Bronchoscopy, Mediastinoscopy, Left VATS for Wedge resection LUL x2 adn LLL x1 - Dr. Elenor Quinones at [facility]  . wrist surgery Left 2015   plate to wrist    Social History   Social History  . Marital status: Divorced    Spouse name: N/A  . Number of children: N/A  . Years of education: N/A   Occupational History  . Not on file.   Social History Main Topics  . Smoking status: Current Every Day Smoker    Packs/day: 0.00    Years: 24.00    Types: E-cigarettes  . Smokeless tobacco: Never Used     Comment: states she stopped cigarettes 2 weeks ago (as 06/24/16), using vapes  . Alcohol use 0.6 oz/week    1 Shots of liquor per week     Comment:  socially  . Drug use: No  . Sexual activity: Not on file   Other Topics Concern  . Not on file   Social History Narrative  . No narrative on file     Review of Systems: General: negative for chills, fever, night sweats or weight changes.  Cardiovascular: negative for chest pain, dyspnea on exertion, edema, orthopnea, palpitations, paroxysmal nocturnal dyspnea or shortness of breath Dermatological: negative for rash Respiratory: negative for cough or wheezing Urologic: negative for hematuria Abdominal: negative for nausea, vomiting, diarrhea, bright red blood per rectum, melena, or hematemesis Neurologic: negative for visual changes, syncope, or dizziness All other systems reviewed and are otherwise negative except as noted above.   Physical Exam:  Blood pressure 118/78, pulse 90, height '5\' 7"'$  (1.702 m), weight 131 lb (59.4 kg), SpO2 98 %.  General appearance: alert, cooperative, no distress and thin appearing  Neck: no carotid bruit and no JVD Lungs: clear to auscultation bilaterally Heart: regular rate and rhythm, S1, S2 normal, no murmur, click, rub or gallop Extremities: extremities normal, atraumatic, no cyanosis or edema Pulses: 2+ and symmetric Skin: Skin color,  texture, turgor normal. No rashes or lesions Neurologic: Grossly normal  EKG NSR. No ischemia.   ASSESSMENT AND PLAN:   1. H/o MVP with MR: s/p recent minimally invasive mitral valve replacement with a bioprosthetic valve, per Dr. Roxy Manns, on 06/27/16. TEE during operation showed normal LVEF of 60-65%. Doing well w/o complications. No chest pain. No dyspnea, orthopnea, PND or LEE. She is on coumadin. Her INRs are followed in our coumadin clinic. Dr. Roxy Manns has cleared her to start cardiac rehab, which she plans on doing.   2. H/o Lung Cancer: treated by and followed at [facility]. Last CT scan showed no recurrence.    PLAN  Pt is doing well post operatively w/o complications. F/u with Dr. Meda Coffee in 3-4 months.   Brittainy Simmons PA-C 07/18/2016 11:00 AM

## 2016-07-18 NOTE — Patient Instructions (Signed)
Medication Instructions:   Your physician recommends that you continue on your current medications as directed. Please refer to the Current Medication list given to you today.     If you need a refill on your cardiac medications before your next appointment, please call your pharmacy.  Labwork: NONE ORDERED  TODAY    Testing/Procedures: NONE ORDERED  TODAY    Follow-Up: IN 3 TO 4 MONTHS WITH DR NELSON    Any Other Special Instructions Will Be Listed Below (If Applicable).

## 2016-07-22 ENCOUNTER — Ambulatory Visit (INDEPENDENT_AMBULATORY_CARE_PROVIDER_SITE_OTHER): Payer: Medicare Other | Admitting: *Deleted

## 2016-07-22 DIAGNOSIS — Z5181 Encounter for therapeutic drug level monitoring: Secondary | ICD-10-CM

## 2016-07-22 DIAGNOSIS — Z953 Presence of xenogenic heart valve: Secondary | ICD-10-CM | POA: Diagnosis not present

## 2016-07-22 LAB — POCT INR: INR: 2.4

## 2016-07-29 ENCOUNTER — Ambulatory Visit (INDEPENDENT_AMBULATORY_CARE_PROVIDER_SITE_OTHER): Payer: Medicare Other

## 2016-07-29 ENCOUNTER — Telehealth (HOSPITAL_COMMUNITY): Payer: Self-pay | Admitting: Internal Medicine

## 2016-07-29 DIAGNOSIS — Z5181 Encounter for therapeutic drug level monitoring: Secondary | ICD-10-CM | POA: Diagnosis not present

## 2016-07-29 DIAGNOSIS — Z953 Presence of xenogenic heart valve: Secondary | ICD-10-CM

## 2016-07-29 LAB — POCT INR: INR: 3.1

## 2016-07-29 NOTE — Telephone Encounter (Signed)
Patient has Medicare A/B, [employer], and [employer]. Verified patient UHC/GEHA insurance: no co-payment, no deductible, no out of pocket and 0% co-insurance. Information given to [facility] for review.

## 2016-08-08 ENCOUNTER — Ambulatory Visit (INDEPENDENT_AMBULATORY_CARE_PROVIDER_SITE_OTHER): Payer: Medicare Other | Admitting: *Deleted

## 2016-08-08 DIAGNOSIS — Z5181 Encounter for therapeutic drug level monitoring: Secondary | ICD-10-CM | POA: Diagnosis not present

## 2016-08-08 DIAGNOSIS — Z953 Presence of xenogenic heart valve: Secondary | ICD-10-CM | POA: Diagnosis not present

## 2016-08-08 LAB — POCT INR: INR: 2.5

## 2016-08-11 ENCOUNTER — Other Ambulatory Visit: Payer: Self-pay | Admitting: Internal Medicine

## 2016-08-12 ENCOUNTER — Telehealth: Payer: Self-pay | Admitting: Emergency Medicine

## 2016-08-12 ENCOUNTER — Telehealth: Payer: Self-pay | Admitting: Internal Medicine

## 2016-08-12 NOTE — Telephone Encounter (Signed)
Can do one more refill then will need to return or have PCP do further refills

## 2016-08-12 NOTE — Telephone Encounter (Signed)
LM for pt to returncall

## 2016-08-12 NOTE — Telephone Encounter (Signed)
Dr. Melvyn Novas  Please Advise-  This pt called in requesting a refill of her Dulera, I informed the pt 1 inhaler was sent in January with no refills because it has been a year since we seen her and she needs a follow up appointment.  Pt did not want to schedule an appointment and wanted you to look at her PFT from 03/12/16 and would you be willing to send in refills

## 2016-08-13 MED ORDER — MOMETASONE FURO-FORMOTEROL FUM 100-5 MCG/ACT IN AERO: INHALATION_SPRAY | RESPIRATORY_TRACT | 0 refills | Status: DC

## 2016-08-13 NOTE — Telephone Encounter (Signed)
Duplicate encounter

## 2016-08-13 NOTE — Telephone Encounter (Signed)
Spoke with pt, aware of recs.  rx sent to preferred pharmacy, rov scheduled with pt.  Nothing further needed.

## 2016-08-17 ENCOUNTER — Other Ambulatory Visit: Payer: Self-pay | Admitting: Physician Assistant

## 2016-08-19 ENCOUNTER — Telehealth: Payer: Self-pay | Admitting: *Deleted

## 2016-08-19 MED ORDER — WARFARIN SODIUM 5 MG PO TABS: ORAL_TABLET | ORAL | 1 refills | Status: DC

## 2016-08-19 NOTE — Telephone Encounter (Signed)
Refill sent.

## 2016-08-20 ENCOUNTER — Ambulatory Visit (INDEPENDENT_AMBULATORY_CARE_PROVIDER_SITE_OTHER): Payer: Medicare Other | Admitting: Pharmacist

## 2016-08-20 DIAGNOSIS — Z5181 Encounter for therapeutic drug level monitoring: Secondary | ICD-10-CM

## 2016-08-20 DIAGNOSIS — Z953 Presence of xenogenic heart valve: Secondary | ICD-10-CM | POA: Diagnosis not present

## 2016-08-20 LAB — POCT INR: INR: 2.4

## 2016-08-27 ENCOUNTER — Encounter (HOSPITAL_COMMUNITY)
Admission: RE | Admit: 2016-08-27 | Discharge: 2016-08-27 | Disposition: A | Discharge status: Home / Self Care | Payer: Medicare Other | Source: Ambulatory Visit | Attending: Cardiology | Admitting: Cardiology

## 2016-08-27 ENCOUNTER — Encounter (HOSPITAL_COMMUNITY): Payer: Self-pay

## 2016-08-27 VITALS — BP 104/64 | HR 87 | Ht 67.0 in | Wt 128.5 lb

## 2016-08-27 DIAGNOSIS — Z953 Presence of xenogenic heart valve: Secondary | ICD-10-CM | POA: Insufficient documentation

## 2016-08-27 DIAGNOSIS — Z7951 Long term (current) use of inhaled steroids: Secondary | ICD-10-CM | POA: Insufficient documentation

## 2016-08-27 DIAGNOSIS — F17210 Nicotine dependence, cigarettes, uncomplicated: Secondary | ICD-10-CM | POA: Diagnosis not present

## 2016-08-27 DIAGNOSIS — J449 Chronic obstructive pulmonary disease, unspecified: Secondary | ICD-10-CM | POA: Insufficient documentation

## 2016-08-27 DIAGNOSIS — Z952 Presence of prosthetic heart valve: Secondary | ICD-10-CM

## 2016-08-27 DIAGNOSIS — Z9221 Personal history of antineoplastic chemotherapy: Secondary | ICD-10-CM | POA: Insufficient documentation

## 2016-08-27 DIAGNOSIS — E039 Hypothyroidism, unspecified: Secondary | ICD-10-CM | POA: Diagnosis not present

## 2016-08-27 DIAGNOSIS — Z79899 Other long term (current) drug therapy: Secondary | ICD-10-CM | POA: Diagnosis not present

## 2016-08-27 DIAGNOSIS — I5022 Chronic systolic (congestive) heart failure: Secondary | ICD-10-CM | POA: Insufficient documentation

## 2016-08-27 DIAGNOSIS — Z85118 Personal history of other malignant neoplasm of bronchus and lung: Secondary | ICD-10-CM | POA: Insufficient documentation

## 2016-08-27 DIAGNOSIS — Z923 Personal history of irradiation: Secondary | ICD-10-CM | POA: Diagnosis not present

## 2016-08-27 NOTE — Progress Notes (Signed)
Cardiac Individual Treatment Plan  Patient Details  Name: Joy Williams MRN: [id number] Date of Birth: 1950/08/12 Referring Provider:     CARDIAC REHAB PHASE II ORIENTATION from 08/27/2016 in [place]  Referring Provider  Ottie Glazier MD      Initial Encounter Date:    CARDIAC REHAB PHASE II ORIENTATION from 08/27/2016 in [place]  Date  08/27/16  Referring Provider  Ottie Glazier MD      Visit Diagnosis: S/P mitral valve replacement - s/p minimally invasive valve replacement on 06/27/2016  Patient's Home Medications on Admission:  Current Outpatient Prescriptions:  .  COCONUT OIL PO, Take 15 mLs by mouth daily. Daily with coffee , Disp: , Rfl:  .  folic acid-pyridoxine-cyancobalamin (FOLTX) 2.5-25-2 MG TABS tablet, Take 1 tablet by mouth daily., Disp: 30 each, Rfl: 1 .  iron polysaccharides (NIFEREX) 150 MG capsule, Take 1 capsule (150 mg total) by mouth daily., Disp: 30 capsule, Rfl: 1 .  levothyroxine (SYNTHROID, LEVOTHROID) 175 MCG tablet, Take 175 mcg by mouth daily before breakfast., Disp: , Rfl:  .  metoprolol tartrate (LOPRESSOR) 25 MG tablet, Takes 1/2 tablet daily, Disp: , Rfl:  .  mometasone-formoterol (DULERA) 100-5 MCG/ACT AERO, 2 puffs inhaled twice daily.  Must keep office visit for further refills., Disp: 1 Inhaler, Rfl: 0 .  traMADol (ULTRAM) 50 MG tablet, Take 1 tablet (50 mg total) by mouth every 6 (six) hours as needed for moderate pain., Disp: 30 tablet, Rfl: 0 .  warfarin (COUMADIN) 5 MG tablet, Take as directed by Coumadin clinic., Disp: 45 tablet, Rfl: 1  Past Medical History: Past Medical History:  Diagnosis Date  . Allergy   . Asthma    pt denies this, but is on Dulera  . Chronic diastolic congestive heart failure ([place])   . 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 COPD (chronic  obstructive pulmonary disease) ([place])   . Family history of adverse reaction to anesthesia    Mother- very sensitive to medication  . Heart murmur    MVP  . Hypothyroidism   . 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 MVP (mitral valve prolapse)   . PONV (postoperative nausea and vomiting)   . Raynaud's disease    Raynaud's  . Raynaud's syndrome   . 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 Severe mitral regurgitation 11/15/2014  . Shortness of breath dyspnea    with exertion  . Telangiectasia   . Thyroid disease     Tobacco Use: History  Smoking Status  . Current Every Day Smoker  . Packs/day: 0.25  . Years: 24.00  . Types: E-cigarettes, Cigarettes  Smokeless Tobacco  . Never Used    Comment: Patient says she is working on quitting smoking    Labs: Recent Review Scientist, physiological    Labs for ITP Cardiac and Pulmonary Rehab Latest Ref Rng & Units 06/27/2016 06/27/2016 06/27/2016 06/28/2016 06/28/2016   Cholestrol 100 - 199 mg/dL - - - - -   LDLCALC 0 - 99 mg/dL - - - - -   HDL >39  mg/dL - - - - -   Trlycerides 0 - 149 mg/dL - - - - -   Hemoglobin A1c 4.8 - 5.6 % - - - - -   PHART 7.350 - 7.450 7.374 7.410 - 7.408 -   PCO2ART 32.0 - 48.0 mmHg 45.2 40.8 - 43.6 -   HCO3 20.0 - 28.0 mmol/L 26.5 26.1 - 27.3 -   TCO2 0 - 100 mmol/L '28 27 25 29 27   '$ ACIDBASEDEF 0.0 - 2.0 mmol/L - - - - -   O2SAT % 99.0 98.0 - 97.0 -      Capillary Blood Glucose: Lab Results  Component Value Date   GLUCAP 86 07/03/2016   GLUCAP 108 (H) 07/02/2016   GLUCAP 102 (H) 07/02/2016   GLUCAP 74 07/02/2016   GLUCAP 95 07/02/2016     Exercise Target Goals: Date: 08/27/16  Exercise Program Goal: Individual exercise  prescription set with THRR, safety & activity barriers. Participant demonstrates ability to understand and report RPE using BORG scale, to self-measure pulse accurately, and to acknowledge the importance of the exercise prescription.  Exercise Prescription Goal: Starting with aerobic activity 30 plus minutes a day, 3 days per week for initial exercise prescription. Provide home exercise prescription and guidelines that participant acknowledges understanding prior to discharge.  Activity Barriers & Risk Stratification:     Activity Barriers & Cardiac Risk Stratification - 08/27/16 0901      Activity Barriers & Cardiac Risk Stratification   Activity Barriers Back Problems;Other (comment)   Comments 3 back surgeries ( herniated disc L4-L5 and L3-L4)   Cardiac Risk Stratification High      6 Minute Walk:     6 Minute Walk    Row Name 08/27/16 1001 08/27/16 1205       6 Minute Walk   Phase Initial  -    Distance 1572 feet  -    Walk Time 6 minutes  -    # of Rest Breaks 0  -    MPH  - 2.98    METS  - 3.97    RPE 11 11    VO2 Peak  - 13.88    Symptoms No  -    Resting HR 87 bpm  -    Resting BP 104/64  -    Max Ex. HR 110 bpm  -    Max Ex. BP 106/62  -    2 Minute Post BP  - 98/60       Oxygen Initial Assessment:   Oxygen Re-Evaluation:   Oxygen Discharge (Final Oxygen Re-Evaluation):   Initial Exercise Prescription:     Initial Exercise Prescription - 08/27/16 1200      Date of Initial Exercise RX and Referring Provider   Date 08/27/16   Referring Provider Ottie Glazier MD     Treadmill   MPH 2.4   Grade 2   Minutes 10   METs 3.5     Bike   Level 0.9   Minutes 10   METs 3.96     NuStep   Level 3   SPM 80   Minutes 10   METs 2     Prescription Details   Frequency (times per week) 3   Duration Progress to 30 minutes of continuous aerobic without signs/symptoms of physical distress     Intensity   THRR 40-80% of Max Heartrate 62-124    Ratings of Perceived Exertion 11-13   Perceived Dyspnea 0-4  Progression   Progression Continue to progress workloads to maintain intensity without signs/symptoms of physical distress.     Resistance Training   Training Prescription Yes   Weight 2lbs   Reps 10-15      Perform Capillary Blood Glucose checks as needed.  Exercise Prescription Changes:   Exercise Comments:   Exercise Goals and Review:      Exercise Goals    Row Name 08/27/16 0902             Exercise Goals   Increase Physical Activity Yes       Intervention Provide advice, education, support and counseling about physical activity/exercise needs.;Develop an individualized exercise prescription for aerobic and resistive training based on initial evaluation findings, risk stratification, comorbidities and participant's personal goals.       Expected Outcomes Achievement of increased cardiorespiratory fitness and enhanced flexibility, muscular endurance and strength shown through measurements of functional capacity and personal statement of participant.       Increase Strength and Stamina Yes  be able to complete a 5K race (Heart Walk) in May       Intervention Provide advice, education, support and counseling about physical activity/exercise needs.;Develop an individualized exercise prescription for aerobic and resistive training based on initial evaluation findings, risk stratification, comorbidities and participant's personal goals.       Expected Outcomes Achievement of increased cardiorespiratory fitness and enhanced flexibility, muscular endurance and strength shown through measurements of functional capacity and personal statement of participant.          Exercise Goals Re-Evaluation :    Discharge Exercise Prescription (Final Exercise Prescription Changes):   Nutrition:  Target Goals: Understanding of nutrition guidelines, daily intake of sodium '1500mg'$ , cholesterol '200mg'$ , calories 30% from fat  and 7% or less from saturated fats, daily to have 5 or more servings of fruits and vegetables.  Biometrics:     Pre Biometrics - 08/27/16 1624      Pre Biometrics   Height '5\' 7"'$  (1.702 m)   Weight 128 lb 8.5 oz (58.3 kg)   Waist Circumference 28 inches   Hip Circumference 34.5 inches   Waist to Hip Ratio 0.81 %   BMI (Calculated) 20.2   Triceps Skinfold 20 mm   % Body Fat 30.3 %   Grip Strength 28 kg   Flexibility 0 in   Single Leg Stand 28.03 seconds       Nutrition Therapy Plan and Nutrition Goals:   Nutrition Discharge: Nutrition Scores:   Nutrition Goals Re-Evaluation:   Nutrition Goals Re-Evaluation:   Nutrition Goals Discharge (Final Nutrition Goals Re-Evaluation):   Psychosocial: Target Goals: Acknowledge presence or absence of significant depression and/or stress, maximize coping skills, provide positive support system. Participant is able to verbalize types and ability to use techniques and skills needed for reducing stress and depression.  Initial Review & Psychosocial Screening:     Initial Psych Review & Screening - 08/27/16 1620      Family Dynamics   Good Support System? Yes     Barriers   Psychosocial barriers to participate in program There are no identifiable barriers or psychosocial needs.     Screening Interventions   Interventions Encouraged to exercise      Quality of Life Scores:     Quality of Life - 08/27/16 1109      Quality of Life Scores   Health/Function Pre 21.5 %   Socioeconomic Pre 23.07 %   Psych/Spiritual Pre 24.86 %   Family Pre  24 %   GLOBAL Pre 22.85 %      PHQ-9: Recent Review Flowsheet Data    There is no flowsheet data to display.     Interpretation of Total Score  Total Score Depression Severity:  1-4 = Minimal depression, 5-9 = Mild depression, 10-14 = Moderate depression, 15-19 = Moderately severe depression, 20-27 = Severe depression   Psychosocial Evaluation and Intervention:   Psychosocial  Re-Evaluation:   Psychosocial Discharge (Final Psychosocial Re-Evaluation):   Vocational Rehabilitation: Provide vocational rehab assistance to qualifying candidates.   Vocational Rehab Evaluation & Intervention:     Vocational Rehab - 08/27/16 1617      Initial Vocational Rehab Evaluation & Intervention   Assessment shows need for Vocational Rehabilitation No  Mrs Somera is retired      Education: Education Goals: Education classes will be provided on a weekly basis, covering required topics. Participant will state understanding/return demonstration of topics presented.  Learning Barriers/Preferences:     Learning Barriers/Preferences - 08/27/16 0901      Learning Barriers/Preferences   Learning Barriers Sight   Learning Preferences Verbal Instruction;Video;Written Material;Skilled Demonstration      Education Topics: Count Your Pulse:  -Group instruction provided by verbal instruction, demonstration, patient participation and written materials to support subject.  Instructors address importance of being able to find your pulse and how to count your pulse when at home without a heart monitor.  Patients get hands on experience counting their pulse with staff help and individually.   Heart Attack, Angina, and Risk Factor Modification:  -Group instruction provided by verbal instruction, video, and written materials to support subject.  Instructors address signs and symptoms of angina and heart attacks.    Also discuss risk factors for heart disease and how to make changes to improve heart health risk factors.   Functional Fitness:  -Group instruction provided by verbal instruction, demonstration, patient participation, and written materials to support subject.  Instructors address safety measures for doing things around the house.  Discuss how to get up and down off the floor, how to pick things up properly, how to safely get out of a chair without assistance, and balance  training.   Meditation and Mindfulness:  -Group instruction provided by verbal instruction, patient participation, and written materials to support subject.  Instructor addresses importance of mindfulness and meditation practice to help reduce stress and improve awareness.  Instructor also leads participants through a meditation exercise.    Stretching for Flexibility and Mobility:  -Group instruction provided by verbal instruction, patient participation, and written materials to support subject.  Instructors lead participants through series of stretches that are designed to increase flexibility thus improving mobility.  These stretches are additional exercise for major muscle groups that are typically performed during regular warm up and cool down.   Hands Only CPR Anytime:  -Group instruction provided by verbal instruction, video, patient participation and written materials to support subject.  Instructors co-teach with AHA video for hands only CPR.  Participants get hands on experience with mannequins.   Nutrition I class: Heart Healthy Eating:  -Group instruction provided by PowerPoint slides, verbal discussion, and written materials to support subject matter. The instructor gives an explanation and review of the Therapeutic Lifestyle Changes diet recommendations, which includes a discussion on lipid goals, dietary fat, sodium, fiber, plant stanol/sterol esters, sugar, and the components of a well-balanced, healthy diet.   Nutrition II class: Lifestyle Skills:  -Group instruction provided by PowerPoint slides, verbal discussion, and written materials  to support subject matter. The instructor gives an explanation and review of label reading, grocery shopping for heart health, heart healthy recipe modifications, and ways to make healthier choices when eating out.   Diabetes Question & Answer:  -Group instruction provided by PowerPoint slides, verbal discussion, and written materials to  support subject matter. The instructor gives an explanation and review of diabetes co-morbidities, pre- and post-prandial blood glucose goals, pre-exercise blood glucose goals, signs, symptoms, and treatment of hypoglycemia and hyperglycemia, and foot care basics.   Diabetes Blitz:  -Group instruction provided by PowerPoint slides, verbal discussion, and written materials to support subject matter. The instructor gives an explanation and review of the physiology behind type 1 and type 2 diabetes, diabetes medications and rational behind using different medications, pre- and post-prandial blood glucose recommendations and Hemoglobin A1c goals, diabetes diet, and exercise including blood glucose guidelines for exercising safely.    Portion Distortion:  -Group instruction provided by PowerPoint slides, verbal discussion, written materials, and food models to support subject matter. The instructor gives an explanation of serving size versus portion size, changes in portions sizes over the last 20 years, and what consists of a serving from each food group.   Stress Management:  -Group instruction provided by verbal instruction, video, and written materials to support subject matter.  Instructors review role of stress in heart disease and how to cope with stress positively.     Exercising on Your Own:  -Group instruction provided by verbal instruction, power point, and written materials to support subject.  Instructors discuss benefits of exercise, components of exercise, frequency and intensity of exercise, and end points for exercise.  Also discuss use of nitroglycerin and activating EMS.  Review options of places to exercise outside of rehab.  Review guidelines for sex with heart disease.   Cardiac Drugs I:  -Group instruction provided by verbal instruction and written materials to support subject.  Instructor reviews cardiac drug classes: antiplatelets, anticoagulants, beta blockers, and statins.   Instructor discusses reasons, side effects, and lifestyle considerations for each drug class.   Cardiac Drugs II:  -Group instruction provided by verbal instruction and written materials to support subject.  Instructor reviews cardiac drug classes: angiotensin converting enzyme inhibitors (ACE-I), angiotensin II receptor blockers (ARBs), nitrates, and calcium channel blockers.  Instructor discusses reasons, side effects, and lifestyle considerations for each drug class.   Anatomy and Physiology of the Circulatory System:  -Group instruction provided by verbal instruction, video, and written materials to support subject.  Reviews functional anatomy of heart, how it relates to various diagnoses, and what role the heart plays in the overall system.   Knowledge Questionnaire Score:     Knowledge Questionnaire Score - 08/27/16 1109      Knowledge Questionnaire Score   Pre Score 23/28      Core Components/Risk Factors/Patient Goals at Admission:     Personal Goals and Risk Factors at Admission - 08/27/16 1220      Core Components/Risk Factors/Patient Goals on Admission   Heart Failure Yes   Intervention Provide a combined exercise and nutrition program that is supplemented with education, support and counseling about heart failure. Directed toward relieving symptoms such as shortness of breath, decreased exercise tolerance, and extremity edema.   Expected Outcomes Improve functional capacity of life;Short term: Attendance in program 2-3 days a week with increased exercise capacity. Reported lower sodium intake. Reported increased fruit and vegetable intake. Reports medication compliance.;Short term: Daily weights obtained and reported for increase. Utilizing diuretic  protocols set by physician.;Long term: Adoption of self-care skills and reduction of barriers for early signs and symptoms recognition and intervention leading to self-care maintenance.      Core Components/Risk  Factors/Patient Goals Review:    Core Components/Risk Factors/Patient Goals at Discharge (Final Review):    ITP Comments:     ITP Comments    Row Name 08/27/16 0836           ITP Comments Medical Director:  Dr. Fransico Him           Comments: Sahily attended orientation from 0800 to 1000 to review rules and guidelines for program. Completed 6 minute walk test, Intitial ITP, and exercise prescription.  VSS. Telemetry-Sinus Rhythm.  Asymptomatic.Barnet Pall, RN,BSN 08/27/2016 4:32 PM

## 2016-08-27 NOTE — Progress Notes (Signed)
Cardiac Rehab Medication Review by a Pharmacist  Does the patient  feel that his/her medications are working for him/her?  yes  Has the patient been experiencing any side effects to the medications prescribed?  Yes - metoprolol causes fatigue - takes at night and tolerates OK. Iron polysaccharide causes dark stools, no constipation.   Does the patient measure his/her own blood pressure or blood glucose at home?  no - has BP cuff but only takes BP when feeling "weird"  Does the patient have any problems obtaining medications due to transportation or finances?   no  Understanding of regimen: good Understanding of indications: good Potential of compliance: good    Pharmacist comments: Patient presents in good spirits ambulating without assistance.  -No changes made to med list -Patient complains of dark stools with iron supplement, no constipation -Reports she was having fatigue with metoprolol, dose was reduced by cardiologist and now she takes at night - tolerating well  Carlean Jews, Pharm.D. PGY1 Pharmacy Resident 4/10/20188:48 AM Pager [phone]

## 2016-09-01 ENCOUNTER — Other Ambulatory Visit: Payer: Self-pay | Admitting: Physician Assistant

## 2016-09-02 ENCOUNTER — Encounter (HOSPITAL_COMMUNITY)
Admission: RE | Admit: 2016-09-02 | Discharge: 2016-09-02 | Disposition: A | Discharge status: Home / Self Care | Payer: Medicare Other | Source: Ambulatory Visit | Attending: Cardiology | Admitting: Cardiology

## 2016-09-02 ENCOUNTER — Telehealth: Payer: Self-pay | Admitting: Cardiology

## 2016-09-02 DIAGNOSIS — Z7951 Long term (current) use of inhaled steroids: Secondary | ICD-10-CM | POA: Diagnosis not present

## 2016-09-02 DIAGNOSIS — I5022 Chronic systolic (congestive) heart failure: Secondary | ICD-10-CM | POA: Diagnosis not present

## 2016-09-02 DIAGNOSIS — Z952 Presence of prosthetic heart valve: Secondary | ICD-10-CM

## 2016-09-02 DIAGNOSIS — Z79899 Other long term (current) drug therapy: Secondary | ICD-10-CM | POA: Diagnosis not present

## 2016-09-02 DIAGNOSIS — Z85118 Personal history of other malignant neoplasm of bronchus and lung: Secondary | ICD-10-CM | POA: Diagnosis not present

## 2016-09-02 DIAGNOSIS — J449 Chronic obstructive pulmonary disease, unspecified: Secondary | ICD-10-CM | POA: Diagnosis not present

## 2016-09-02 DIAGNOSIS — Z953 Presence of xenogenic heart valve: Secondary | ICD-10-CM | POA: Diagnosis not present

## 2016-09-02 NOTE — Telephone Encounter (Signed)
SPOKE WITH Joy Williams  FROM CARDIAC REHAB   RE PT  HAS HAD  EPISODES OF  DIZZINESS  B/P  TODAY WAS 98/60 UPON ARRIVAL AND 104/60 AT  EXIT AND HEART  RATE  WAS  90  UPON ARRIVAL  AND  INCREASED TO 102  DURING EXERCISE  AND  87 UPON EXIT . PT IS CURRENTLY TAKING  METOPROLOL 12.5 MG BID .

## 2016-09-02 NOTE — Progress Notes (Signed)
Daily Session Note  Patient Details  Name: Joy Williams MRN: [id number] Date of Birth: 1950/09/05 Referring Provider:     [place] from 08/27/2016 in [place]  Referring Provider  Ottie Glazier MD      Encounter Date: 09/02/2016  Check In:     Session Check In - 09/02/16 0945      Check-In   Location [facility]   Staff Present Su Hilt, MS, ACSM RCEP, Exercise Physiologist;Ashley Armstrong, MS, Exercise Physiologist;Carlette Wilber Oliphant, RN, BSN;Joann Rion, RN, Marga Melnick, RN, BSN   Supervising physician immediately available to respond to emergencies [facility] immediately available   Physician(s) Dr. Cathlean Sauer   Medication changes reported     No   Fall or balance concerns reported    No   Tobacco Cessation No Change   Warm-up and Cool-down Performed as group-led instruction   Resistance Training Performed Yes   VAD Patient? No     Pain Assessment   Currently in Pain? No/denies   Multiple Pain Sites No      Capillary Blood Glucose: No results found for this or any previous visit (from the past 24 hour(s)).    History  Smoking Status  . Current Every Day Smoker  . Packs/day: 0.25  . Years: 24.00  . Types: E-cigarettes, Cigarettes  Smokeless Tobacco  . Never Used    Comment: Patient says she is working on quitting smoking    Goals Met:  Exercise completed without further complaints or symptoms.  Goals Unmet:  Not Applicable  Comments: [name] started cardiac rehab today.  Pt complained of feeling lightheaded after getting off the treadmill today at cardiac rehab. Blood pressure 124/70. [name] said she only had a yogurt for breakfast this morning. Patient given a banana and water. Symptoms resolved after [name] rested and ate her snack. VSS, telemetry-Sinus Rhythm, Patient encouraged to eat a well rounded breakfast before coming to exercise at cardiac rehab. [name] LPN called and notified about today's events..Will fax exercise flow sheets to Dr. Francesca Oman office for review.  Medication list reconciled. Pt denies barriers to medicaiton compliance.  PSYCHOSOCIAL ASSESSMENT:  PHQ-0. Pt exhibits positive coping skills, hopeful outlook with supportive family. No psychosocial needs identified at this time, no psychosocial interventions necessary.  [name] had lung cancer in 07-27-2014 and her father passed away last year.  Pt enjoys gardening and taking care of her granchildren.   Pt oriented to exercise equipment and routine.    Understanding verbalized.Barnet Pall, RN,BSN 09/02/2016 4:19 PM  Dr. Fransico Him is Medical Director for Cardiac Rehab at [facility].

## 2016-09-02 NOTE — Telephone Encounter (Signed)
DISCUSSED   MESSAGE WITH DR  Angelena Form   HAVE  PT  STOP METOPROLOL  . PT  AWARE AND  WILL  FORWARD TO  DR  Meda Coffee   FOR  REVIEW .Marland Kitchen/CY

## 2016-09-02 NOTE — Telephone Encounter (Signed)
New Message     Pt had dizziness during cardiac rehab

## 2016-09-04 ENCOUNTER — Encounter (HOSPITAL_COMMUNITY)
Admission: RE | Admit: 2016-09-04 | Discharge: 2016-09-04 | Disposition: A | Discharge status: Home / Self Care | Payer: Medicare Other | Source: Ambulatory Visit | Attending: Cardiology | Admitting: Cardiology

## 2016-09-04 ENCOUNTER — Other Ambulatory Visit: Payer: Self-pay | Admitting: Physician Assistant

## 2016-09-04 DIAGNOSIS — Z952 Presence of prosthetic heart valve: Secondary | ICD-10-CM

## 2016-09-04 DIAGNOSIS — Z79899 Other long term (current) drug therapy: Secondary | ICD-10-CM | POA: Diagnosis not present

## 2016-09-04 DIAGNOSIS — Z953 Presence of xenogenic heart valve: Secondary | ICD-10-CM | POA: Diagnosis not present

## 2016-09-04 DIAGNOSIS — Z7951 Long term (current) use of inhaled steroids: Secondary | ICD-10-CM | POA: Diagnosis not present

## 2016-09-04 DIAGNOSIS — Z85118 Personal history of other malignant neoplasm of bronchus and lung: Secondary | ICD-10-CM | POA: Diagnosis not present

## 2016-09-04 DIAGNOSIS — I5022 Chronic systolic (congestive) heart failure: Secondary | ICD-10-CM | POA: Diagnosis not present

## 2016-09-04 DIAGNOSIS — J449 Chronic obstructive pulmonary disease, unspecified: Secondary | ICD-10-CM | POA: Diagnosis not present

## 2016-09-04 NOTE — Telephone Encounter (Addendum)
Pt is calling to inform Dr Meda Coffee that she is still experiencing blurred vision, in one eye only, that is accompanied with mild dizziness.  Pt states its intermittent in nature.  Pt states she has no other symptoms.  Pt states she has no cp, sob, palpitations, headache, pre-syncopal or syncopal episodes.  Pt states she has no weakness, she's able to ambulate with no difficulties, she has no speech impairment, and is able to make her needs  known.  Pt states her BP is WNL now around 106s/70s and HR in the 80s.  Pt states she attended cardiac rehab today with no issues, and responded well to the exercise portion of rehab.  Pt states she stopped taking metoprolol as ordered by DOD Dr Angelena Form on 4/16 telephone call placed to our office for complaints mentioned.  Pt is unsure if this is related at all to her heart or prior s/p mitral valve replacement over 60 days ago.  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  Pt states that it doesn't feel like that.  Pt states she has no floaters.  Pt states she is going to call and make herself a Eye MD appt, for she is overdue for that anyway's.  Asked the pt if she has seen her PCP since she had her MVR?  Pt reports she has not, and was not aware that she should have followed with her PCP.  Advised the pt that she should call her PCP today, and schedule a follow-up appt with him as well, to rule out other non-cardiac related issues.  Informed the pt that being she is in no acute distress and has no cardiac complaints at this time, I will route this message to Dr Meda Coffee for further review and recommendation if needed, and follow-up with the pt accordingly.  Informed the pt that Drucie Opitz from Cardiac Rehab faxed Dr Meda Coffee some flow sheets from 4/16, when pt attended cardiac rehab and experienced complications.  Informed the pt that once Dr Meda Coffee returns to the office, I will show her this for her review.  Pt  verbalized understanding and agrees with this plan.  Pt states she will call her Ophthalmologist and PCP now to arrange follow-up appts for complaints mentioned.

## 2016-09-04 NOTE — Telephone Encounter (Signed)
New message   Patient calling   C/O blurred vision happen about 3-4 time after surgery . S/p 60 day post surgery by Dr. Ricard Dillon mitral valve replacement.    Dr. Ricard Dillon has not been contacted.   Patient stated this is the first office she has contacted.   Patient stated Dr. Meda Coffee discontinue metoprolol .

## 2016-09-04 NOTE — Progress Notes (Signed)
Joy Williams told me that her metoprolol was discontinued. Patient reported feeling a little lightheaded getting off the treadmill today. Vital signs were stable. Patient was given Gatorade. Symptoms resolved after resting. Joy Williams walked the track without further complaints. Joy Williams will walk the track and not use the treadmill for now. Joy Williams reports that she is still smoking 3 cigarettes a day. Patient giving smoking cessation information.Will continue to monitor the patient throughout  the program.Joy Venetia Maxon, RN,BSN 09/04/2016 5:41 PM

## 2016-09-05 NOTE — Telephone Encounter (Signed)
I agree with ophthalmology appointment.  The only other option would be a mini stroke, but she is on warfarin and INR is always therapeutic, so the chance is very low. KN

## 2016-09-06 ENCOUNTER — Encounter (HOSPITAL_COMMUNITY)
Admission: RE | Admit: 2016-09-06 | Discharge: 2016-09-06 | Disposition: A | Discharge status: Home / Self Care | Payer: Medicare Other | Source: Ambulatory Visit | Attending: Cardiology | Admitting: Cardiology

## 2016-09-06 ENCOUNTER — Other Ambulatory Visit: Payer: Self-pay | Admitting: Internal Medicine

## 2016-09-06 DIAGNOSIS — J449 Chronic obstructive pulmonary disease, unspecified: Secondary | ICD-10-CM | POA: Diagnosis not present

## 2016-09-06 DIAGNOSIS — Z953 Presence of xenogenic heart valve: Secondary | ICD-10-CM | POA: Diagnosis not present

## 2016-09-06 DIAGNOSIS — G453 Amaurosis fugax: Secondary | ICD-10-CM | POA: Diagnosis not present

## 2016-09-06 DIAGNOSIS — Z952 Presence of prosthetic heart valve: Secondary | ICD-10-CM

## 2016-09-06 DIAGNOSIS — Z85118 Personal history of other malignant neoplasm of bronchus and lung: Secondary | ICD-10-CM | POA: Diagnosis not present

## 2016-09-06 DIAGNOSIS — I5022 Chronic systolic (congestive) heart failure: Secondary | ICD-10-CM | POA: Diagnosis not present

## 2016-09-06 DIAGNOSIS — Z79899 Other long term (current) drug therapy: Secondary | ICD-10-CM | POA: Diagnosis not present

## 2016-09-06 DIAGNOSIS — Z6820 Body mass index (BMI) 20.0-20.9, adult: Secondary | ICD-10-CM | POA: Diagnosis not present

## 2016-09-06 DIAGNOSIS — Z7951 Long term (current) use of inhaled steroids: Secondary | ICD-10-CM | POA: Diagnosis not present

## 2016-09-06 NOTE — Telephone Encounter (Signed)
Pt aware of Dr Francesca Oman recommendations from conversation and complaints made earlier in the week.  Pt verbalized understanding and agrees with these recommendations.

## 2016-09-09 ENCOUNTER — Encounter (HOSPITAL_COMMUNITY)
Admission: RE | Admit: 2016-09-09 | Discharge: 2016-09-09 | Disposition: A | Discharge status: Home / Self Care | Payer: Medicare Other | Source: Ambulatory Visit | Attending: Cardiology | Admitting: Cardiology

## 2016-09-09 DIAGNOSIS — J449 Chronic obstructive pulmonary disease, unspecified: Secondary | ICD-10-CM | POA: Diagnosis not present

## 2016-09-09 DIAGNOSIS — Z7951 Long term (current) use of inhaled steroids: Secondary | ICD-10-CM | POA: Diagnosis not present

## 2016-09-09 DIAGNOSIS — Z953 Presence of xenogenic heart valve: Secondary | ICD-10-CM | POA: Diagnosis not present

## 2016-09-09 DIAGNOSIS — Z79899 Other long term (current) drug therapy: Secondary | ICD-10-CM | POA: Diagnosis not present

## 2016-09-09 DIAGNOSIS — H5310 Unspecified subjective visual disturbances: Secondary | ICD-10-CM | POA: Diagnosis not present

## 2016-09-09 DIAGNOSIS — I5022 Chronic systolic (congestive) heart failure: Secondary | ICD-10-CM | POA: Diagnosis not present

## 2016-09-09 DIAGNOSIS — Z85118 Personal history of other malignant neoplasm of bronchus and lung: Secondary | ICD-10-CM | POA: Diagnosis not present

## 2016-09-09 NOTE — Progress Notes (Signed)
Joy Williams 66 y.o. female Nutrition Note Spoke with pt. Nutrition Survey reviewed with pt. Pt is following Step 1 of the Therapeutic Lifestyle Changes diet. Pt eats out frequently. Per discussion, pt is not interested in changing her diet further at this time. Pt is on Coumadin and is aware of the need to follow a diet with consistent vitamin K intake. Pt expressed understanding of the information reviewed. Pt aware of nutrition education classes offered and plans on attending nutrition classes. Lab Results  Component Value Date   HGBA1C 5.2 06/24/2016   Wt Readings from Last 3 Encounters:  08/27/16 128 lb 8.5 oz (58.3 kg)  07/18/16 131 lb (59.4 kg)  07/15/16 130 lb (59 kg)   Nutrition Diagnosis ? Food-and nutrition-related knowledge deficit related to lack of exposure to information as related to diagnosis of: ? CVD Nutrition Intervention ? Benefits of adopting Therapeutic Lifestyle Changes discussed when Medficts reviewed. ? Pt to attend the Portion Distortion class ? Pt to attend the  ? Nutrition I class                      ? Nutrition II class ? Continue client-centered nutrition education by RD, as part of interdisciplinary care.  Goal(s) ? Pt to identify and limit food sources of saturated fat, trans fat, and sodium  Monitor and Evaluate progress toward nutrition goal with team.  Derek Mound, M.Ed, RD, LDN, CDE 09/09/2016 10:40 AM

## 2016-09-10 ENCOUNTER — Ambulatory Visit (INDEPENDENT_AMBULATORY_CARE_PROVIDER_SITE_OTHER): Payer: Medicare Other | Admitting: *Deleted

## 2016-09-10 DIAGNOSIS — Z5181 Encounter for therapeutic drug level monitoring: Secondary | ICD-10-CM

## 2016-09-10 DIAGNOSIS — Z953 Presence of xenogenic heart valve: Secondary | ICD-10-CM | POA: Diagnosis not present

## 2016-09-10 LAB — POCT INR: INR: 1.9

## 2016-09-11 ENCOUNTER — Encounter (HOSPITAL_COMMUNITY)
Admission: RE | Admit: 2016-09-11 | Discharge: 2016-09-11 | Disposition: A | Discharge status: Home / Self Care | Payer: Medicare Other | Source: Ambulatory Visit | Attending: Cardiology | Admitting: Cardiology

## 2016-09-11 ENCOUNTER — Other Ambulatory Visit: Payer: Self-pay | Admitting: Ophthalmology

## 2016-09-11 DIAGNOSIS — Z7951 Long term (current) use of inhaled steroids: Secondary | ICD-10-CM | POA: Diagnosis not present

## 2016-09-11 DIAGNOSIS — Z953 Presence of xenogenic heart valve: Secondary | ICD-10-CM | POA: Diagnosis not present

## 2016-09-11 DIAGNOSIS — I5022 Chronic systolic (congestive) heart failure: Secondary | ICD-10-CM | POA: Diagnosis not present

## 2016-09-11 DIAGNOSIS — Z85118 Personal history of other malignant neoplasm of bronchus and lung: Secondary | ICD-10-CM | POA: Diagnosis not present

## 2016-09-11 DIAGNOSIS — Z79899 Other long term (current) drug therapy: Secondary | ICD-10-CM | POA: Diagnosis not present

## 2016-09-11 DIAGNOSIS — Z952 Presence of prosthetic heart valve: Secondary | ICD-10-CM

## 2016-09-11 DIAGNOSIS — J449 Chronic obstructive pulmonary disease, unspecified: Secondary | ICD-10-CM | POA: Diagnosis not present

## 2016-09-11 DIAGNOSIS — H5310 Unspecified subjective visual disturbances: Secondary | ICD-10-CM

## 2016-09-11 NOTE — Progress Notes (Signed)
Cardiac Individual Treatment Plan  Patient Details  Name: Joy Williams MRN: [id number] Date of Birth: 01/14/1951 Referring Provider:     CARDIAC REHAB PHASE II ORIENTATION from 08/27/2016 in [place]  Referring Provider  Ottie Glazier MD      Initial Encounter Date:    CARDIAC REHAB PHASE II ORIENTATION from 08/27/2016 in [place]  Date  08/27/16  Referring Provider  Ottie Glazier MD      Visit Diagnosis: S/P mitral valve replacement  Patient's Home Medications on Admission:  Current Outpatient Prescriptions:  .  COCONUT OIL PO, Take 15 mLs by mouth daily. Daily with coffee , Disp: , Rfl:  .  levothyroxine (SYNTHROID, LEVOTHROID) 175 MCG tablet, Take 175 mcg by mouth daily before breakfast., Disp: , Rfl:  .  mometasone-formoterol (DULERA) 100-5 MCG/ACT AERO, 2 puffs inhaled twice daily.  Must keep office visit for further refills., Disp: 1 Inhaler, Rfl: 0 .  warfarin (COUMADIN) 5 MG tablet, Take as directed by Coumadin clinic., Disp: 45 tablet, Rfl: 1  Past Medical History: Past Medical History:  Diagnosis Date  . Allergy   . Asthma    pt denies this, but is on Dulera  . Chronic diastolic congestive heart failure ([place])   . 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 COPD (chronic obstructive pulmonary disease) ([place])   . Family history of adverse reaction to anesthesia    Mother- very sensitive to medication  . Heart murmur    MVP  . Hypothyroidism   . 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 MVP (mitral valve prolapse)   . PONV (postoperative nausea and vomiting)   . Raynaud's disease    Raynaud's  . Raynaud's syndrome   . 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 Severe mitral regurgitation 11/15/2014  . Shortness of breath dyspnea    with exertion  . Telangiectasia   . Thyroid disease     Tobacco Use: History  Smoking Status  . Current Every Day Smoker  . Packs/day: 0.25  . Years: 24.00  . Types: E-cigarettes, Cigarettes  Smokeless Tobacco  . Never Used    Comment: Patient says she is working on quitting smoking    Labs: Recent Review Scientist, physiological    Labs for ITP Cardiac and Pulmonary Rehab Latest Ref Rng & Units 06/27/2016 06/27/2016 06/27/2016 06/28/2016 06/28/2016   Cholestrol 100 - 199 mg/dL - - - - -   LDLCALC 0 - 99 mg/dL - - - - -   HDL >39 mg/dL - - - - -   Trlycerides 0 - 149 mg/dL - - - - -   Hemoglobin A1c 4.8 - 5.6 % - - - - -   PHART 7.350 - 7.450 7.374 7.410 - 7.408 -   PCO2ART 32.0 - 48.0 mmHg 45.2 40.8 - 43.6 -   HCO3 20.0 - 28.0 mmol/L 26.5 26.1 - 27.3 -   TCO2 0 - 100 mmol/L '28 27 25 29 27   '$ ACIDBASEDEF 0.0 - 2.0 mmol/L - - - - -   O2SAT % 99.0  98.0 - 97.0 -      Capillary Blood Glucose: Lab Results  Component Value Date   GLUCAP 86 07/03/2016   GLUCAP 108 (H) 07/02/2016   GLUCAP 102 (H) 07/02/2016   GLUCAP 74 07/02/2016   GLUCAP 95 07/02/2016     Exercise Target Goals:    Exercise Program Goal: Individual exercise prescription set with THRR, safety & activity barriers. Participant demonstrates ability to understand and report RPE using BORG scale, to self-measure pulse accurately, and to acknowledge the importance of the exercise prescription.  Exercise Prescription Goal: Starting with aerobic activity 30 plus minutes a day, 3 days per week for initial exercise prescription. Provide home exercise prescription and guidelines that participant acknowledges understanding prior to discharge.  Activity Barriers & Risk Stratification:     Activity Barriers &  Cardiac Risk Stratification - 08/27/16 0901      Activity Barriers & Cardiac Risk Stratification   Activity Barriers Back Problems;Other (comment)   Comments 3 back surgeries ( herniated disc L4-L5 and L3-L4)   Cardiac Risk Stratification High      6 Minute Walk:     6 Minute Walk    Row Name 08/27/16 1001 08/27/16 1205       6 Minute Walk   Phase Initial  -    Distance 1572 feet  -    Walk Time 6 minutes  -    # of Rest Breaks 0  -    MPH  - 2.98    METS  - 3.97    RPE 11 11    VO2 Peak  - 13.88    Symptoms No  -    Resting HR 87 bpm  -    Resting BP 104/64  -    Max Ex. HR 110 bpm  -    Max Ex. BP 106/62  -    2 Minute Post BP  - 98/60       Oxygen Initial Assessment:   Oxygen Re-Evaluation:   Oxygen Discharge (Final Oxygen Re-Evaluation):   Initial Exercise Prescription:     Initial Exercise Prescription - 08/27/16 1200      Date of Initial Exercise RX and Referring Provider   Date 08/27/16   Referring Provider Ottie Glazier MD     Treadmill   MPH 2.4   Grade 2   Minutes 10   METs 3.5     Bike   Level 0.9   Minutes 10   METs 3.96     NuStep   Level 3   SPM 80   Minutes 10   METs 2     Prescription Details   Frequency (times per week) 3   Duration Progress to 30 minutes of continuous aerobic without signs/symptoms of physical distress     Intensity   THRR 40-80% of Max Heartrate 62-124   Ratings of Perceived Exertion 11-13   Perceived Dyspnea 0-4     Progression   Progression Continue to progress workloads to maintain intensity without signs/symptoms of physical distress.     Resistance Training   Training Prescription Yes   Weight 2lbs   Reps 10-15      Perform Capillary Blood Glucose checks as needed.  Exercise Prescription Changes:   Exercise Comments:     Exercise Comments    Row Name 09/11/16 1721           Exercise Comments Reviewed home exercise guidelines with pt. Not currently exercising at home but  plans to start  walking for 30 minutes, 2 days/week in addition to cardiac rehab.          Exercise Goals and Review:     Exercise Goals    Row Name 08/27/16 0902             Exercise Goals   Increase Physical Activity Yes       Intervention Provide advice, education, support and counseling about physical activity/exercise needs.;Develop an individualized exercise prescription for aerobic and resistive training based on initial evaluation findings, risk stratification, comorbidities and participant's personal goals.       Expected Outcomes Achievement of increased cardiorespiratory fitness and enhanced flexibility, muscular endurance and strength shown through measurements of functional capacity and personal statement of participant.       Increase Strength and Stamina Yes  be able to complete a 5K race (Heart Walk) in May       Intervention Provide advice, education, support and counseling about physical activity/exercise needs.;Develop an individualized exercise prescription for aerobic and resistive training based on initial evaluation findings, risk stratification, comorbidities and participant's personal goals.       Expected Outcomes Achievement of increased cardiorespiratory fitness and enhanced flexibility, muscular endurance and strength shown through measurements of functional capacity and personal statement of participant.          Exercise Goals Re-Evaluation :    Discharge Exercise Prescription (Final Exercise Prescription Changes):   Nutrition:  Target Goals: Understanding of nutrition guidelines, daily intake of sodium '1500mg'$ , cholesterol '200mg'$ , calories 30% from fat and 7% or less from saturated fats, daily to have 5 or more servings of fruits and vegetables.  Biometrics:     Pre Biometrics - 08/27/16 1624      Pre Biometrics   Height '5\' 7"'$  (1.702 m)   Weight 128 lb 8.5 oz (58.3 kg)   Waist Circumference 28 inches   Hip Circumference 34.5 inches   Waist  to Hip Ratio 0.81 %   BMI (Calculated) 20.2   Triceps Skinfold 20 mm   % Body Fat 30.3 %   Grip Strength 28 kg   Flexibility 0 in   Single Leg Stand 28.03 seconds       Nutrition Therapy Plan and Nutrition Goals:     Nutrition Therapy & Goals - 09/09/16 1046      Nutrition Therapy   Diet Therapeutic Lifestyle changes   Drug/Food Interactions Coumadin/Vit K     Personal Nutrition Goals   Nutrition Goal Pt to identify and limit dietary sources of saturated fat, trans fat, and sodium.      Intervention Plan   Intervention Prescribe, educate and counsel regarding individualized specific dietary modifications aiming towards targeted core components such as weight, hypertension, lipid management, diabetes, heart failure and other comorbidities.   Expected Outcomes Short Term Goal: Understand basic principles of dietary content, such as calories, fat, sodium, cholesterol and nutrients.;Long Term Goal: Adherence to prescribed nutrition plan.      Nutrition Discharge: Nutrition Scores:     Nutrition Assessments - 09/09/16 1046      MEDFICTS Scores   Pre Score 68      Nutrition Goals Re-Evaluation:   Nutrition Goals Re-Evaluation:   Nutrition Goals Discharge (Final Nutrition Goals Re-Evaluation):   Psychosocial: Target Goals: Acknowledge presence or absence of significant depression and/or stress, maximize coping skills, provide positive support system. Participant is able to verbalize types and ability to use techniques and skills needed for reducing stress and depression.  Initial Review & Psychosocial Screening:  Initial Psych Review & Screening - 08/27/16 1620      Family Dynamics   Good Support System? Yes     Barriers   Psychosocial barriers to participate in program There are no identifiable barriers or psychosocial needs.     Screening Interventions   Interventions Encouraged to exercise      Quality of Life Scores:     Quality of Life - 08/27/16  1109      Quality of Life Scores   Health/Function Pre 21.5 %   Socioeconomic Pre 23.07 %   Psych/Spiritual Pre 24.86 %   Family Pre 24 %   GLOBAL Pre 22.85 %      PHQ-9: Recent Review Flowsheet Data    Depression screen [facility] 2/9 09/02/2016   Decreased Interest 0   Down, Depressed, Hopeless 0   PHQ - 2 Score 0     Interpretation of Total Score  Total Score Depression Severity:  1-4 = Minimal depression, 5-9 = Mild depression, 10-14 = Moderate depression, 15-19 = Moderately severe depression, 20-27 = Severe depression   Psychosocial Evaluation and Intervention:   Psychosocial Re-Evaluation:     Psychosocial Re-Evaluation    Row Name 09/11/16 1751             Psychosocial Re-Evaluation   Current issues with None Identified       Interventions Encouraged to attend Cardiac Rehabilitation for the exercise;Stress management education       Continue Psychosocial Services  No Follow up required          Psychosocial Discharge (Final Psychosocial Re-Evaluation):     Psychosocial Re-Evaluation - 09/11/16 1751      Psychosocial Re-Evaluation   Current issues with None Identified   Interventions Encouraged to attend Cardiac Rehabilitation for the exercise;Stress management education   Continue Psychosocial Services  No Follow up required      Vocational Rehabilitation: Provide vocational rehab assistance to qualifying candidates.   Vocational Rehab Evaluation & Intervention:     Vocational Rehab - 08/27/16 1617      Initial Vocational Rehab Evaluation & Intervention   Assessment shows need for Vocational Rehabilitation No  Joy Williams is retired      Education: Education Goals: Education classes will be provided on a weekly basis, covering required topics. Participant will state understanding/return demonstration of topics presented.  Learning Barriers/Preferences:     Learning Barriers/Preferences - 08/27/16 0901      Learning Barriers/Preferences    Learning Barriers Sight   Learning Preferences Verbal Instruction;Video;Written Material;Skilled Demonstration      Education Topics: Count Your Pulse:  -Group instruction provided by verbal instruction, demonstration, patient participation and written materials to support subject.  Instructors address importance of being able to find your pulse and how to count your pulse when at home without a heart monitor.  Patients get hands on experience counting their pulse with staff help and individually.   Heart Attack, Angina, and Risk Factor Modification:  -Group instruction provided by verbal instruction, video, and written materials to support subject.  Instructors address signs and symptoms of angina and heart attacks.    Also discuss risk factors for heart disease and how to make changes to improve heart health risk factors.   Functional Fitness:  -Group instruction provided by verbal instruction, demonstration, patient participation, and written materials to support subject.  Instructors address safety measures for doing things around the house.  Discuss how to get up and down off the floor, how to pick things  up properly, how to safely get out of a chair without assistance, and balance training.   Meditation and Mindfulness:  -Group instruction provided by verbal instruction, patient participation, and written materials to support subject.  Instructor addresses importance of mindfulness and meditation practice to help reduce stress and improve awareness.  Instructor also leads participants through a meditation exercise.    Stretching for Flexibility and Mobility:  -Group instruction provided by verbal instruction, patient participation, and written materials to support subject.  Instructors lead participants through series of stretches that are designed to increase flexibility thus improving mobility.  These stretches are additional exercise for major muscle groups that are typically  performed during regular warm up and cool down.   Hands Only CPR Anytime:  -Group instruction provided by verbal instruction, video, patient participation and written materials to support subject.  Instructors co-teach with AHA video for hands only CPR.  Participants get hands on experience with mannequins.   Nutrition I class: Heart Healthy Eating:  -Group instruction provided by PowerPoint slides, verbal discussion, and written materials to support subject matter. The instructor gives an explanation and review of the Therapeutic Lifestyle Changes diet recommendations, which includes a discussion on lipid goals, dietary fat, sodium, fiber, plant stanol/sterol esters, sugar, and the components of a well-balanced, healthy diet.   Nutrition II class: Lifestyle Skills:  -Group instruction provided by PowerPoint slides, verbal discussion, and written materials to support subject matter. The instructor gives an explanation and review of label reading, grocery shopping for heart health, heart healthy recipe modifications, and ways to make healthier choices when eating out.   Diabetes Question & Answer:  -Group instruction provided by PowerPoint slides, verbal discussion, and written materials to support subject matter. The instructor gives an explanation and review of diabetes co-morbidities, pre- and post-prandial blood glucose goals, pre-exercise blood glucose goals, signs, symptoms, and treatment of hypoglycemia and hyperglycemia, and foot care basics.   Diabetes Blitz:  -Group instruction provided by PowerPoint slides, verbal discussion, and written materials to support subject matter. The instructor gives an explanation and review of the physiology behind type 1 and type 2 diabetes, diabetes medications and rational behind using different medications, pre- and post-prandial blood glucose recommendations and Hemoglobin A1c goals, diabetes diet, and exercise including blood glucose guidelines for  exercising safely.    Portion Distortion:  -Group instruction provided by PowerPoint slides, verbal discussion, written materials, and food models to support subject matter. The instructor gives an explanation of serving size versus portion size, changes in portions sizes over the last 20 years, and what consists of a serving from each food group.   Stress Management:  -Group instruction provided by verbal instruction, video, and written materials to support subject matter.  Instructors review role of stress in heart disease and how to cope with stress positively.     Exercising on Your Own:  -Group instruction provided by verbal instruction, power point, and written materials to support subject.  Instructors discuss benefits of exercise, components of exercise, frequency and intensity of exercise, and end points for exercise.  Also discuss use of nitroglycerin and activating EMS.  Review options of places to exercise outside of rehab.  Review guidelines for sex with heart disease.   Cardiac Drugs I:  -Group instruction provided by verbal instruction and written materials to support subject.  Instructor reviews cardiac drug classes: antiplatelets, anticoagulants, beta blockers, and statins.  Instructor discusses reasons, side effects, and lifestyle considerations for each drug class.   Cardiac Drugs II:  -  Group instruction provided by verbal instruction and written materials to support subject.  Instructor reviews cardiac drug classes: angiotensin converting enzyme inhibitors (ACE-I), angiotensin II receptor blockers (ARBs), nitrates, and calcium channel blockers.  Instructor discusses reasons, side effects, and lifestyle considerations for each drug class.   Anatomy and Physiology of the Circulatory System:  -Group instruction provided by verbal instruction, video, and written materials to support subject.  Reviews functional anatomy of heart, how it relates to various diagnoses, and what  role the heart plays in the overall system.   Knowledge Questionnaire Score:     Knowledge Questionnaire Score - 08/27/16 1109      Knowledge Questionnaire Score   Pre Score 23/28      Core Components/Risk Factors/Patient Goals at Admission:     Personal Goals and Risk Factors at Admission - 08/27/16 1220      Core Components/Risk Factors/Patient Goals on Admission   Heart Failure Yes   Intervention Provide a combined exercise and nutrition program that is supplemented with education, support and counseling about heart failure. Directed toward relieving symptoms such as shortness of breath, decreased exercise tolerance, and extremity edema.   Expected Outcomes Improve functional capacity of life;Short term: Attendance in program 2-3 days a week with increased exercise capacity. Reported lower sodium intake. Reported increased fruit and vegetable intake. Reports medication compliance.;Short term: Daily weights obtained and reported for increase. Utilizing diuretic protocols set by physician.;Long term: Adoption of self-care skills and reduction of barriers for early signs and symptoms recognition and intervention leading to self-care maintenance.      Core Components/Risk Factors/Patient Goals Review:    Core Components/Risk Factors/Patient Goals at Discharge (Final Review):    ITP Comments:     ITP Comments    Row Name 08/27/16 0836           ITP Comments Medical Director:  Dr. Fransico Him           Comments: Joy Williams is making expected progress toward personal goals after completing 6 sessions. Recommend continued exercise and life style modification education including  stress management and relaxation techniques to decrease cardiac risk profile. Joy Williams has not complained of any further episodes of dizziness since her metoprolol has been discontinued. Joy Williams does still have some systolic blood pressure readings in the 90's. Will send [name]'s exercise flow sheets to Dr  Francesca Oman office for review. Joy Williams is enjoying participating in phase 2 cardiac rehab. Joy Williams continues to smoke 3 cigarettes a day. Smoking cessation encouraged.Barnet Pall, RN,BSN 09/11/2016 6:04 PM

## 2016-09-11 NOTE — Progress Notes (Signed)
Reviewed home exercise guidelines with patient including endpoints, temperature precautions, target heart rate and rate of perceived exertion. Pt plans to walk as her mode of home exercise. Pt voices understanding of instructions given. Sol Passer, MS, ACSM CEP

## 2016-09-12 ENCOUNTER — Other Ambulatory Visit (HOSPITAL_COMMUNITY): Payer: Medicare Other

## 2016-09-13 ENCOUNTER — Encounter (HOSPITAL_COMMUNITY)
Admission: RE | Admit: 2016-09-13 | Discharge: 2016-09-13 | Disposition: A | Discharge status: Home / Self Care | Payer: Medicare Other | Source: Ambulatory Visit | Attending: Cardiology | Admitting: Cardiology

## 2016-09-13 ENCOUNTER — Ambulatory Visit
Admission: RE | Admit: 2016-09-13 | Discharge: 2016-09-13 | Disposition: A | Discharge status: Home / Self Care | Payer: Medicare Other | Source: Ambulatory Visit | Attending: Internal Medicine | Admitting: Internal Medicine

## 2016-09-13 DIAGNOSIS — Z79899 Other long term (current) drug therapy: Secondary | ICD-10-CM | POA: Diagnosis not present

## 2016-09-13 DIAGNOSIS — G453 Amaurosis fugax: Secondary | ICD-10-CM

## 2016-09-13 DIAGNOSIS — I5022 Chronic systolic (congestive) heart failure: Secondary | ICD-10-CM | POA: Diagnosis not present

## 2016-09-13 DIAGNOSIS — I6523 Occlusion and stenosis of bilateral carotid arteries: Secondary | ICD-10-CM | POA: Diagnosis not present

## 2016-09-13 DIAGNOSIS — Z952 Presence of prosthetic heart valve: Secondary | ICD-10-CM

## 2016-09-13 DIAGNOSIS — J449 Chronic obstructive pulmonary disease, unspecified: Secondary | ICD-10-CM | POA: Diagnosis not present

## 2016-09-13 DIAGNOSIS — Z7951 Long term (current) use of inhaled steroids: Secondary | ICD-10-CM | POA: Diagnosis not present

## 2016-09-13 DIAGNOSIS — Z953 Presence of xenogenic heart valve: Secondary | ICD-10-CM | POA: Diagnosis not present

## 2016-09-13 DIAGNOSIS — Z85118 Personal history of other malignant neoplasm of bronchus and lung: Secondary | ICD-10-CM | POA: Diagnosis not present

## 2016-09-16 ENCOUNTER — Encounter (HOSPITAL_COMMUNITY): Payer: Medicare Other

## 2016-09-16 ENCOUNTER — Encounter: Payer: Medicare Other | Admitting: Thoracic Surgery (Cardiothoracic Vascular Surgery)

## 2016-09-17 ENCOUNTER — Encounter: Payer: Self-pay | Admitting: Internal Medicine

## 2016-09-17 ENCOUNTER — Ambulatory Visit (INDEPENDENT_AMBULATORY_CARE_PROVIDER_SITE_OTHER): Payer: Medicare Other | Admitting: Internal Medicine

## 2016-09-17 VITALS — BP 104/62 | HR 83 | Ht 67.0 in | Wt 129.0 lb

## 2016-09-17 DIAGNOSIS — J449 Chronic obstructive pulmonary disease, unspecified: Secondary | ICD-10-CM | POA: Diagnosis not present

## 2016-09-17 MED ORDER — BUDESONIDE-FORMOTEROL FUMARATE 80-4.5 MCG/ACT IN AERO: 2.0000 | INHALATION_SPRAY | Freq: Two times a day (BID) | RESPIRATORY_TRACT | 11 refills | Status: DC

## 2016-09-17 MED ORDER — BUDESONIDE-FORMOTEROL FUMARATE 80-4.5 MCG/ACT IN AERO: 2.0000 | INHALATION_SPRAY | Freq: Two times a day (BID) | RESPIRATORY_TRACT | 0 refills | Status: DC

## 2016-09-17 NOTE — Progress Notes (Signed)
Subjective:    Patient ID: Joy Williams, female    DOB: May 29, 1950    MRN: [id number]    Brief patient profile:  46 yowm quit smoking 06/2016  dx with CREST 2004 in [place] moved to [place] from [place] Nov 2015  self referred to pulmonary clinic 10/03/14  for copd though last set of pfts from 2014 did not show copd    History of Present Illness  10/03/2014 1st [facility] office visit/ W[facility]  On spiriva chronically  Chief Complaint  Patient presents with  . Pulmonary Consult    Self referral. Pt moved from [place] to [facility] in Nov 2015. 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  She c/o "smoker's cough"- proc with clear sputum.  Her breathing is overall doing well.    cough is the main problem - can still do 5k racing power walk s stopping ie Not limited by breathing from desired activities  Takes fish oil for heart regurgitation and it "works great"  Cough is daily , def Worse in am's year round, present x > 5 y, worse since in [place]  rec Stop spiriva and start >>> Dulera 100 Take 2 puffs first thing in am and then another 2 puffs about 12 hours later.  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 Surgical resection with wedge resection and mediastinal lymph node sampling 12/2014  Records suggest  pneumonectomy done but clearly this is an error      06/07/2015  Extended f/u ov/Wert re: transition of care  sob s/p L lung resection maint rx dulera 100 2bid  Chief Complaint  Patient presents with  . Follow-up    Pt c/o increased DOE since had lung surgery for lung ca in Sept 2016.   doe x  MMRC2 = can't walk a nl pace on a flat grade s sob/ assoc with day > noct cough/ no assoc dysphagia/ has not had any RT  rec For cough > mucinex dm 1200 mg every 12 hours as needed Continue dulera 100 Take 2 puffs first thing in am and then another 2 puffs about 12 hours later.  Work on inhaler technique:  The key is to stop smoking completely before smoking completely stops you!  Try prilosec otc 58m  Take 30-60 min before first meal of the day and Pepcid ac (famotidine) 20 mg one @  bedtime until cough is completely gone for at least a week without the need for cough suppression GERD diet    09/17/2016  f/u ov/Wert re: AB on dulera 100 2bid but insurance no longer covering  Chief Complaint  Patient presents with  . Follow-up    Pt states her breathing is doing well. She is requesting refill on Dulera. She states she has AM cough if she  forgets to take the PM dose of Dulera. She does not have an albuterol inhaler.   working toward a 5 k this month s/p  MVR 06/27/16  No obvious day to day or daytime variability or assoc excess/ purulent sputum or mucus plugs or hemoptysis or cp or chest tightness, subjective wheeze or overt sinus or hb symptoms. No unusual exp hx or h/o childhood pna/ asthma or knowledge of premature birth.  Sleeping ok without nocturnal  or early am exacerbation  of respiratory  c/o's or need for noct saba. Also denies any obvious fluctuation of symptoms with weather or environmental changes or other aggravating or alleviating factors except as outlined above   Current Medications, Allergies, Complete Past Medical History, Past Surgical History, Family History, and Social History  were reviewed in [employer] record.  ROS  The following are not active complaints unless bolded sore throat, dysphagia, dental problems, itching, sneezing,  nasal congestion or excess/ purulent secretions, ear ache,   fever, chills, sweats, unintended wt loss, classically pleuritic or exertional cp,  orthopnea pnd or leg swelling, presyncope, palpitations, abdominal pain, anorexia, nausea, vomiting, diarrhea  or change in bowel or bladder habits, change in stools or urine, dysuria,hematuria,  rash, arthralgias, visual complaints, headache, numbness, weakness or ataxia or problems with walking or coordination,  change in mood/affect or memory.                  Objective:   Physical Exam  amb thin wf nad/ extremely hoarse   09/17/2016         129  06/07/2015       134   11/14/14 127 lb (57.607 kg)  10/28/14 125 lb (56.7 kg)  10/03/14 128 lb (58.06 kg)    Vital signs reviewed  - Note on arrival 02 sats  100% on RA     HEENT: nl dentition, turbinates, and orophanx. Nl external ear canals without cough reflex   NECK :  without JVD/Nodes/TM/ nl carotid upstrokes bilaterally   LUNGS: no acc muscle use, slt distant bs esp on L s audible wheeze and no cough on insp/ exp    CV:  RRR  no s3  slt increased S1 or increase in P2,  no edema   ABD:  soft and nontender with nl excursion in the supine position. No bruits or organomegaly, bowel sounds nl  MS:  warm without deformities, calf tenderness, cyanosis or clubbing  SKIN: warm and dry with classic telangectasias esp face and volar surfaces both hands  NEURO:  alert, approp, no deficits        I personally reviewed images and agree with radiology impression as follows:  CXR:   07/15/16 Areas of postoperative change and scarring in the left lung. Slight lateral right base atelectasis. No edema or consolidation. Stable cardiac silhouette. Aortic atherosclerosis. Status post mitral valve replacement.         Assessment & Plan:   Outpatient Encounter Prescriptions as of 09/17/2016  Medication Sig  . COCONUT OIL PO Take 15 mLs by mouth daily. Daily with coffee   . levothyroxine (SYNTHROID, LEVOTHROID) 175 MCG tablet Take 175 mcg by mouth daily before breakfast.  . Multiple Vitamins-Minerals (MULTIVITAMIN WITH MINERALS) tablet Take 1 tablet by mouth daily.  Marland Kitchen warfarin (COUMADIN) 5 MG tablet Take as directed by Coumadin clinic.  . [DISCONTINUED] mometasone-formoterol (DULERA) 100-5 MCG/ACT AERO 2 puffs inhaled twice daily.  Must keep office visit for further refills.  . budesonide-formoterol (SYMBICORT) 80-4.5  MCG/ACT inhaler Inhale 2 puffs into the lungs 2 (two) times daily.  . [DISCONTINUED] budesonide-formoterol (SYMBICORT) 80-4.5 MCG/ACT inhaler Inhale 2 puffs into the lungs 2 (two) times daily.   No facility-administered encounter medications on file as of 09/17/2016.

## 2016-09-17 NOTE — Patient Instructions (Signed)
Work on inhaler technique:  relax and gently blow all the way out then take a nice smooth deep breath back in, triggering the inhaler at same time you start breathing in.  Hold for up to 5 seconds if you can. Blow out thru nose. Rinse and gargle with water when done    Continue symbicort 80 Take 2 puffs first thing in am and then another 2 puffs about 12 hours later.      If you are satisfied with your treatment plan,  let your doctor know and he/she can either refill your medications or you can return here when your prescription runs out.     If in any way you are not 100% satisfied,  please tell us.  If 100% better, tell your friends!  Pulmonary follow up is as needed

## 2016-09-18 ENCOUNTER — Encounter (HOSPITAL_COMMUNITY)
Admission: RE | Admit: 2016-09-18 | Discharge: 2016-09-18 | Disposition: A | Discharge status: Home / Self Care | Payer: Medicare Other | Source: Ambulatory Visit | Attending: Cardiology | Admitting: Cardiology

## 2016-09-18 DIAGNOSIS — Z85118 Personal history of other malignant neoplasm of bronchus and lung: Secondary | ICD-10-CM | POA: Diagnosis not present

## 2016-09-18 DIAGNOSIS — Z953 Presence of xenogenic heart valve: Secondary | ICD-10-CM | POA: Diagnosis not present

## 2016-09-18 DIAGNOSIS — Z7951 Long term (current) use of inhaled steroids: Secondary | ICD-10-CM | POA: Insufficient documentation

## 2016-09-18 DIAGNOSIS — F17210 Nicotine dependence, cigarettes, uncomplicated: Secondary | ICD-10-CM | POA: Diagnosis not present

## 2016-09-18 DIAGNOSIS — I5022 Chronic systolic (congestive) heart failure: Secondary | ICD-10-CM | POA: Diagnosis not present

## 2016-09-18 DIAGNOSIS — E039 Hypothyroidism, unspecified: Secondary | ICD-10-CM | POA: Diagnosis not present

## 2016-09-18 DIAGNOSIS — Z9221 Personal history of antineoplastic chemotherapy: Secondary | ICD-10-CM | POA: Diagnosis not present

## 2016-09-18 DIAGNOSIS — Z79899 Other long term (current) drug therapy: Secondary | ICD-10-CM | POA: Insufficient documentation

## 2016-09-18 DIAGNOSIS — Z923 Personal history of irradiation: Secondary | ICD-10-CM | POA: Diagnosis not present

## 2016-09-18 DIAGNOSIS — J449 Chronic obstructive pulmonary disease, unspecified: Secondary | ICD-10-CM | POA: Diagnosis not present

## 2016-09-18 DIAGNOSIS — Z952 Presence of prosthetic heart valve: Secondary | ICD-10-CM

## 2016-09-19 ENCOUNTER — Encounter: Payer: Self-pay | Admitting: Internal Medicine

## 2016-09-19 NOTE — Assessment & Plan Note (Signed)
-  12/16/2012  s sign obst or even concavity to f/v loop  - 10/03/2014   try dulera 100 2bid  - PFTs 11/14/14 on dulera FEV1  2.33 (86%) ratio 78% and no change p saba and dlco 52% corrects to 65%  - Spirometry 06/07/2015  FEV1 1.74 (67%)  Ratio 76   - 09/17/2016  After extensive coaching HFA effectiveness =    75%  (short Ti)   She has surprisingly good control of ab since quit smoking despite suboptimal compliance with dulera  I had an extended final summary discussion with the patient reviewing all relevant studies completed to date and  lasting 15 to 20 minutes of a 25 minute visit on the following issues:    1)   I reviewed the Fletcher curve with the patient that basically indicates  if you quit smoking when your best day FEV1 is still well preserved (as is clearly  the case here)  it is highly unlikely you will progress to severe disease and informed the patient there was  no medication on the market that has proven to alter the curve/ its downward trajectory  or the likelihood of progression of their disease(unlike other chronic medical conditions such as atheroclerosis where we do think we can change the natural hx with risk reducing meds)    Therefore   maintaining abstinence is the most important aspect of care, not choice of inhalers or for that matter, doctors.    2) Formulary restrictions will be an ongoing challenge for the forseable future and I would be happy to pick an alternative if the pt will first  provide me a list of them but pt  will need to return here for training for any new device that is required eg dpi vs hfa vs respimat.    In meantime we can always provide samples so the patient never runs out of any needed respiratory medications.    3) symbicort 80 = dulera 100 and sample given/ years supply > f/u prn    Each maintenance medication was reviewed in detail including most importantly the difference between maintenance and as needed and under what circumstances the prns  are to be used.  Please see AVS for specific  Instructions which are unique to this visit and I personally typed out  which were reviewed in detail in writing with the patient and a copy provided.

## 2016-09-20 ENCOUNTER — Encounter (HOSPITAL_COMMUNITY): Payer: Medicare Other

## 2016-09-23 ENCOUNTER — Encounter (HOSPITAL_COMMUNITY)
Admission: RE | Admit: 2016-09-23 | Discharge: 2016-09-23 | Disposition: A | Discharge status: Home / Self Care | Payer: Medicare Other | Source: Ambulatory Visit | Attending: Cardiology | Admitting: Cardiology

## 2016-09-23 VITALS — Wt 127.9 lb

## 2016-09-23 DIAGNOSIS — Z952 Presence of prosthetic heart valve: Secondary | ICD-10-CM

## 2016-09-23 DIAGNOSIS — I5022 Chronic systolic (congestive) heart failure: Secondary | ICD-10-CM | POA: Diagnosis not present

## 2016-09-23 DIAGNOSIS — Z953 Presence of xenogenic heart valve: Secondary | ICD-10-CM | POA: Diagnosis not present

## 2016-09-23 DIAGNOSIS — Z79899 Other long term (current) drug therapy: Secondary | ICD-10-CM | POA: Diagnosis not present

## 2016-09-23 DIAGNOSIS — Z7951 Long term (current) use of inhaled steroids: Secondary | ICD-10-CM | POA: Diagnosis not present

## 2016-09-23 DIAGNOSIS — J449 Chronic obstructive pulmonary disease, unspecified: Secondary | ICD-10-CM | POA: Diagnosis not present

## 2016-09-23 DIAGNOSIS — Z85118 Personal history of other malignant neoplasm of bronchus and lung: Secondary | ICD-10-CM | POA: Diagnosis not present

## 2016-09-24 ENCOUNTER — Ambulatory Visit (INDEPENDENT_AMBULATORY_CARE_PROVIDER_SITE_OTHER): Payer: Medicare Other | Admitting: *Deleted

## 2016-09-24 DIAGNOSIS — E038 Other specified hypothyroidism: Secondary | ICD-10-CM | POA: Diagnosis not present

## 2016-09-24 DIAGNOSIS — I340 Nonrheumatic mitral (valve) insufficiency: Secondary | ICD-10-CM | POA: Diagnosis not present

## 2016-09-24 DIAGNOSIS — N390 Urinary tract infection, site not specified: Secondary | ICD-10-CM | POA: Diagnosis not present

## 2016-09-24 DIAGNOSIS — Z5181 Encounter for therapeutic drug level monitoring: Secondary | ICD-10-CM

## 2016-09-24 DIAGNOSIS — R8299 Other abnormal findings in urine: Secondary | ICD-10-CM | POA: Diagnosis not present

## 2016-09-24 DIAGNOSIS — Z953 Presence of xenogenic heart valve: Secondary | ICD-10-CM

## 2016-09-24 DIAGNOSIS — Z780 Asymptomatic menopausal state: Secondary | ICD-10-CM | POA: Diagnosis not present

## 2016-09-24 LAB — HEPATIC FUNCTION PANEL
ALK PHOS: 61 (ref 25–125)
ALT: 32 (ref 7–35)
AST: 22 (ref 13–35)
BILIRUBIN, TOTAL: 0.3

## 2016-09-24 LAB — CBC AND DIFFERENTIAL
HEMATOCRIT: 37 (ref 36–46)
HEMOGLOBIN: 12.3 (ref 12.0–16.0)
PLATELETS: 185 (ref 150–399)
WBC: 7.2

## 2016-09-24 LAB — BASIC METABOLIC PANEL
BUN: 20 (ref 4–21)
Creatinine: 0.8 (ref 0.5–1.1)
GLUCOSE: 71
Potassium: 4.5 (ref 3.4–5.3)
Sodium: 139 (ref 137–147)

## 2016-09-24 LAB — TSH
TSH: 0.68 (ref 0.41–5.90)
TSH: 4.53 (ref 0.41–5.90)

## 2016-09-24 LAB — POCT INR: INR: 1.9

## 2016-09-24 LAB — LIPID PANEL
Cholesterol: 199 (ref 0–200)
HDL: 52 (ref 35–70)
LDL CALC: 120
TRIGLYCERIDES: 136 (ref 40–160)

## 2016-09-25 ENCOUNTER — Encounter (HOSPITAL_COMMUNITY): Payer: Medicare Other

## 2016-09-26 ENCOUNTER — Other Ambulatory Visit: Payer: Self-pay

## 2016-09-26 ENCOUNTER — Telehealth: Payer: Self-pay | Admitting: Cardiology

## 2016-09-26 ENCOUNTER — Ambulatory Visit (HOSPITAL_COMMUNITY): Payer: Medicare Other | Attending: Cardiology

## 2016-09-26 DIAGNOSIS — H5310 Unspecified subjective visual disturbances: Secondary | ICD-10-CM | POA: Diagnosis not present

## 2016-09-26 LAB — ECHOCARDIOGRAM COMPLETE
E decel time: 525 msec
E/e' ratio: 17.73
FS: 43 % (ref 28–44)
IV/PV OW: 0.87
LADIAMINDEX: 1.73 cm/m2
LASIZE: 29 mm
LEFT ATRIUM END SYS DIAM: 29 mm
LV E/e' medial: 17.73
LV TDI E'LATERAL: 4.59
LV TDI E'MEDIAL: 3.38
LV e' LATERAL: 4.59 cm/s
LVEEAVG: 17.73
LVOT area: 2.01 cm2
LVOTD: 16 mm
MV Dec: 525
MV Peak grad: 3 mmHg
MVPKAVEL: 90.8 m/s
MVPKEVEL: 81.4 m/s
PW: 13.4 mm — AB (ref 0.6–1.1)

## 2016-09-26 NOTE — Telephone Encounter (Signed)
Reviewed with the pt Dr Francesca Oman interpretation of her carotid US ordered by Dr Ardeth Perfect.  Informed the pt that per Dr Meda Coffee, the ordering MD should order another follow-up Carotid US x 1 year.  Informed the pt that her echo has not been read yet.  Pt verbalized understanding and gracious for all the assistance.  Pt agrees with this plan.

## 2016-09-26 NOTE — Telephone Encounter (Signed)
New message       Pt had an echo today ordered by another physician.  She is calling to ask Dr Meda Coffee to also look at the echo or read the final report.

## 2016-09-26 NOTE — Telephone Encounter (Signed)
Pt calling to report to Dr Meda Coffee 2 things:  1.)  Pt reports that she had a carotid doppler done on 4/27 and Dr Ardeth Perfect was too route the results for Dr Meda Coffee to review, for they noted calcification on that study.  Pt wanted to see if Dr Meda Coffee even received this.   2.)  Pt also reports that she had an echo done today, ordered by Dr Valetta Close, and she wanted Dr Meda Coffee to review this as well, to obtain her opinion, once the study is ready too review.    Informed the pt that Dr Meda Coffee is out of the office today, but I will route this message to her as an FYI and to request that she look at both tests pt received.  Both are in epic, and will route her carotid US to Dr Meda Coffee for review, since this is complete. Informed the pt that I will follow-up with her accordingly.  Pt verbalized understanding and agrees with this plan.

## 2016-09-26 NOTE — Telephone Encounter (Signed)
Dorothy Spark, MD  Nuala Alpha, LPN        Carotid US:  Less than 50% stenosis in the left internal carotid artery.    50-69% stenosis in the right internal carotid artery.    The ordering physician should order one in 1 year   Echo is not read yet      Left a message for the pt to call back.

## 2016-09-26 NOTE — Telephone Encounter (Signed)
Follow Up ° ° ° °Returning call from earlier. Please call. °

## 2016-09-27 ENCOUNTER — Encounter (HOSPITAL_COMMUNITY)
Admission: RE | Admit: 2016-09-27 | Discharge: 2016-09-27 | Disposition: A | Discharge status: Home / Self Care | Payer: Medicare Other | Source: Ambulatory Visit | Attending: Cardiology | Admitting: Cardiology

## 2016-09-27 DIAGNOSIS — Z952 Presence of prosthetic heart valve: Secondary | ICD-10-CM

## 2016-09-30 ENCOUNTER — Other Ambulatory Visit: Payer: Self-pay | Admitting: *Deleted

## 2016-09-30 ENCOUNTER — Telehealth (HOSPITAL_COMMUNITY): Payer: Self-pay | Admitting: Internal Medicine

## 2016-09-30 ENCOUNTER — Encounter (HOSPITAL_COMMUNITY): Payer: Medicare Other

## 2016-09-30 ENCOUNTER — Encounter: Payer: Medicare Other | Admitting: Thoracic Surgery (Cardiothoracic Vascular Surgery)

## 2016-10-02 ENCOUNTER — Encounter (HOSPITAL_COMMUNITY): Payer: Medicare Other

## 2016-10-02 ENCOUNTER — Telehealth (HOSPITAL_COMMUNITY): Payer: Self-pay | Admitting: *Deleted

## 2016-10-04 ENCOUNTER — Encounter (HOSPITAL_COMMUNITY): Payer: Medicare Other

## 2016-10-07 ENCOUNTER — Telehealth (HOSPITAL_COMMUNITY): Payer: Self-pay | Admitting: Internal Medicine

## 2016-10-07 ENCOUNTER — Encounter (HOSPITAL_COMMUNITY): Payer: Medicare Other

## 2016-10-08 ENCOUNTER — Encounter (HOSPITAL_COMMUNITY): Payer: Self-pay | Admitting: *Deleted

## 2016-10-08 ENCOUNTER — Ambulatory Visit (INDEPENDENT_AMBULATORY_CARE_PROVIDER_SITE_OTHER): Payer: Medicare Other | Admitting: *Deleted

## 2016-10-08 DIAGNOSIS — Z5181 Encounter for therapeutic drug level monitoring: Secondary | ICD-10-CM

## 2016-10-08 DIAGNOSIS — Z953 Presence of xenogenic heart valve: Secondary | ICD-10-CM

## 2016-10-08 DIAGNOSIS — Z952 Presence of prosthetic heart valve: Secondary | ICD-10-CM

## 2016-10-08 LAB — POCT INR: INR: 2.7

## 2016-10-08 NOTE — Progress Notes (Signed)
Cardiac Individual Treatment Plan  Patient Details  Name: Joy Williams MRN: [id number] Date of Birth: [birth year] Referring Provider:     CARDIAC REHAB PHASE II ORIENTATION from 08/27/2016 in [facility]  Referring Provider  Ottie Glazier MD      Initial Encounter Date:    CARDIAC REHAB PHASE II ORIENTATION from 08/27/2016 in [facility]  Date  08/27/16  Referring Provider  Ottie Glazier MD      Visit Diagnosis: S/P mitral valve replacement  Patient's Home Medications on Admission:  Current Outpatient Prescriptions:  .  budesonide-formoterol (SYMBICORT) 80-4.5 MCG/ACT inhaler, Inhale 2 puffs into the lungs 2 (two) times daily., Disp: 1 Inhaler, Rfl: 11 .  COCONUT OIL PO, Take 15 mLs by mouth daily. Daily with coffee , Disp: , Rfl:  .  levothyroxine (SYNTHROID, LEVOTHROID) 175 MCG tablet, Take 175 mcg by mouth daily before breakfast., Disp: , Rfl:  .  Multiple Vitamins-Minerals (MULTIVITAMIN WITH MINERALS) tablet, Take 1 tablet by mouth daily., Disp: , Rfl:  .  warfarin (COUMADIN) 5 MG tablet, Take as directed by Coumadin clinic., Disp: 45 tablet, Rfl: 1  Past Medical History: Past Medical History:  Diagnosis Date  . Allergy   . Asthma    pt denies this, but is on Dulera  . Chronic diastolic congestive heart failure ([place])   . 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 COPD (chronic obstructive pulmonary disease) ([place])   . Family history of adverse reaction to anesthesia    Mother- very sensitive to medication  . Heart murmur    MVP  . Hypothyroidism   . 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 MVP (mitral valve prolapse)   . PONV (postoperative nausea and vomiting)   .  Raynaud's disease    Raynaud's  . Raynaud's syndrome   . 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 Severe mitral regurgitation 11/15/2014  . Shortness of breath dyspnea    with exertion  . Telangiectasia   . Thyroid disease     Tobacco Use: History  Smoking Status  . Former Smoker  . Packs/day: 0.25  . Years: 24.00  . Types: E-cigarettes, Cigarettes  . Quit date: 06/27/2016  Smokeless Tobacco  . Never Used    Labs: Recent Review Flowsheet Data    Labs for ITP Cardiac and Pulmonary Rehab Latest Ref Rng & Units 06/27/2016 06/27/2016 06/27/2016 06/28/2016 06/28/2016   Cholestrol 100 - 199 mg/dL - - - - -   LDLCALC 0 - 99 mg/dL - - - - -   HDL >39 mg/dL - - - - -   Trlycerides 0 - 149 mg/dL - - - - -   Hemoglobin A1c 4.8 - 5.6 % - - - - -   PHART 7.350 - 7.450 7.374 7.410 - 7.408 -   PCO2ART 32.0 - 48.0 mmHg 45.2 40.8 - 43.6 -   HCO3 20.0 - 28.0 mmol/L 26.5 26.1 - 27.3 -   TCO2 0 - 100 mmol/L '28 27 25 29 27   '$ ACIDBASEDEF 0.0 - 2.0 mmol/L - - - - -  O2SAT % 99.0 98.0 - 97.0 -      Capillary Blood Glucose: Lab Results  Component Value Date   GLUCAP 86 07/03/2016   GLUCAP 108 (H) 07/02/2016   GLUCAP 102 (H) 07/02/2016   GLUCAP 74 07/02/2016   GLUCAP 95 07/02/2016     Exercise Target Goals:    Exercise Program Goal: Individual exercise prescription set with THRR, safety & activity barriers. Participant demonstrates ability to understand and report RPE using BORG scale, to self-measure pulse accurately, and to acknowledge the importance of the exercise prescription.  Exercise Prescription Goal: Starting with aerobic activity 30 plus minutes a day, 3 days per week for initial exercise prescription. Provide home exercise prescription and guidelines that participant acknowledges understanding prior to discharge.  Activity Barriers &  Risk Stratification:     Activity Barriers & Cardiac Risk Stratification - 08/27/16 0901      Activity Barriers & Cardiac Risk Stratification   Activity Barriers Back Problems;Other (comment)   Comments 3 back surgeries ( herniated disc L4-L5 and L3-L4)   Cardiac Risk Stratification High      6 Minute Walk:     6 Minute Walk    Row Name 08/27/16 1001 08/27/16 1205       6 Minute Walk   Phase Initial  -    Distance 1572 feet  -    Walk Time 6 minutes  -    # of Rest Breaks 0  -    MPH  - 2.98    METS  - 3.97    RPE 11 11    VO2 Peak  - 13.88    Symptoms No  -    Resting HR 87 bpm  -    Resting BP 104/64  -    Max Ex. HR 110 bpm  -    Max Ex. BP 106/62  -    2 Minute Post BP  - 98/60       Oxygen Initial Assessment:   Oxygen Re-Evaluation:   Oxygen Discharge (Final Oxygen Re-Evaluation):   Initial Exercise Prescription:     Initial Exercise Prescription - 08/27/16 1200      Date of Initial Exercise RX and Referring Provider   Date 08/27/16   Referring Provider Ottie Glazier MD     Treadmill   MPH 2.4   Grade 2   Minutes 10   METs 3.5     Bike   Level 0.9   Minutes 10   METs 3.96     NuStep   Level 3   SPM 80   Minutes 10   METs 2     Prescription Details   Frequency (times per week) 3   Duration Progress to 30 minutes of continuous aerobic without signs/symptoms of physical distress     Intensity   THRR 40-80% of Max Heartrate 62-124   Ratings of Perceived Exertion 11-13   Perceived Dyspnea 0-4     Progression   Progression Continue to progress workloads to maintain intensity without signs/symptoms of physical distress.     Resistance Training   Training Prescription Yes   Weight 2lbs   Reps 10-15      Perform Capillary Blood Glucose checks as needed.  Exercise Prescription Changes:      Exercise Prescription Changes    Row Name 09/25/16 1700             Response to Exercise   Blood Pressure (Admit) 135/70  Blood Pressure (Exercise) 126/70       Blood Pressure (Exit) 111/74       Heart Rate (Admit) 81 bpm       Heart Rate (Exercise) 108 bpm       Heart Rate (Exit) 80 bpm       Rating of Perceived Exertion (Exercise) 13       Duration Progress to 45 minutes of aerobic exercise without signs/symptoms of physical distress       Intensity THRR unchanged         Progression   Progression Continue to progress workloads to maintain intensity without signs/symptoms of physical distress.       Average METs 3.1         Resistance Training   Training Prescription Yes       Weight 2lbs       Reps 10-15         Treadmill   MPH -       Grade -       Minutes -       METs -         Bike   Level 0.9       Minutes 10       METs 3.96         NuStep   Level 3       SPM 80       Minutes 10       METs 2.9         Track   Laps 14       Minutes 10       METs 3.43         Home Exercise Plan   Plans to continue exercise at Home (comment)       Frequency Add 3 additional days to program exercise sessions.       Initial Home Exercises Provided 09/11/16          Exercise Comments:      Exercise Comments    Row Name 09/11/16 1721 10/09/16 0835         Exercise Comments Reviewed home exercise guidelines with pt. Not currently exercising at home but plans to start walking for 30 minutes, 2 days/week in addition to cardiac rehab. Pt has been absent for the past few weeks due to back pain.  She plans to return next week.         Exercise Goals and Review:      Exercise Goals    Row Name 08/27/16 0902             Exercise Goals   Increase Physical Activity Yes       Intervention Provide advice, education, support and counseling about physical activity/exercise needs.;Develop an individualized exercise prescription for aerobic and resistive training based on initial evaluation findings, risk stratification, comorbidities and participant's personal goals.       Expected Outcomes  Achievement of increased cardiorespiratory fitness and enhanced flexibility, muscular endurance and strength shown through measurements of functional capacity and personal statement of participant.       Increase Strength and Stamina Yes  be able to complete a 5K race (Heart Walk) in May       Intervention Provide advice, education, support and counseling about physical activity/exercise needs.;Develop an individualized exercise prescription for aerobic and resistive training based on initial evaluation findings, risk stratification, comorbidities and participant's personal goals.       Expected Outcomes Achievement of increased  cardiorespiratory fitness and enhanced flexibility, muscular endurance and strength shown through measurements of functional capacity and personal statement of participant.          Exercise Goals Re-Evaluation :    Discharge Exercise Prescription (Final Exercise Prescription Changes):     Exercise Prescription Changes - 09/25/16 1700      Response to Exercise   Blood Pressure (Admit) 135/70   Blood Pressure (Exercise) 126/70   Blood Pressure (Exit) 111/74   Heart Rate (Admit) 81 bpm   Heart Rate (Exercise) 108 bpm   Heart Rate (Exit) 80 bpm   Rating of Perceived Exertion (Exercise) 13   Duration Progress to 45 minutes of aerobic exercise without signs/symptoms of physical distress   Intensity THRR unchanged     Progression   Progression Continue to progress workloads to maintain intensity without signs/symptoms of physical distress.   Average METs 3.1     Resistance Training   Training Prescription Yes   Weight 2lbs   Reps 10-15     Treadmill   MPH --   Grade --   Minutes --   METs --     Bike   Level 0.9   Minutes 10   METs 3.96     NuStep   Level 3   SPM 80   Minutes 10   METs 2.9     Track   Laps 14   Minutes 10   METs 3.43     Home Exercise Plan   Plans to continue exercise at Home (comment)   Frequency Add 3 additional days  to program exercise sessions.   Initial Home Exercises Provided 09/11/16      Nutrition:  Target Goals: Understanding of nutrition guidelines, daily intake of sodium '1500mg'$ , cholesterol '200mg'$ , calories 30% from fat and 7% or less from saturated fats, daily to have 5 or more servings of fruits and vegetables.  Biometrics:     Pre Biometrics - 08/27/16 1624      Pre Biometrics   Height '5\' 7"'$  (1.702 m)   Weight 128 lb 8.5 oz (58.3 kg)   Waist Circumference 28 inches   Hip Circumference 34.5 inches   Waist to Hip Ratio 0.81 %   BMI (Calculated) 20.2   Triceps Skinfold 20 mm   % Body Fat 30.3 %   Grip Strength 28 kg   Flexibility 0 in   Single Leg Stand 28.03 seconds       Nutrition Therapy Plan and Nutrition Goals:     Nutrition Therapy & Goals - 09/09/16 1046      Nutrition Therapy   Diet Therapeutic Lifestyle changes   Drug/Food Interactions Coumadin/Vit K     Personal Nutrition Goals   Nutrition Goal Pt to identify and limit dietary sources of saturated fat, trans fat, and sodium.      Intervention Plan   Intervention Prescribe, educate and counsel regarding individualized specific dietary modifications aiming towards targeted core components such as weight, hypertension, lipid management, diabetes, heart failure and other comorbidities.   Expected Outcomes Short Term Goal: Understand basic principles of dietary content, such as calories, fat, sodium, cholesterol and nutrients.;Long Term Goal: Adherence to prescribed nutrition plan.      Nutrition Discharge: Nutrition Scores:     Nutrition Assessments - 09/09/16 1046      MEDFICTS Scores   Pre Score 68      Nutrition Goals Re-Evaluation:   Nutrition Goals Re-Evaluation:   Nutrition Goals Discharge (Final Nutrition Goals Re-Evaluation):  Psychosocial: Target Goals: Acknowledge presence or absence of significant depression and/or stress, maximize coping skills, provide positive support system.  Participant is able to verbalize types and ability to use techniques and skills needed for reducing stress and depression.  Initial Review & Psychosocial Screening:     Initial Psych Review & Screening - 08/27/16 1620      Family Dynamics   Good Support System? Yes     Barriers   Psychosocial barriers to participate in program There are no identifiable barriers or psychosocial needs.     Screening Interventions   Interventions Encouraged to exercise      Quality of Life Scores:     Quality of Life - 08/27/16 1109      Quality of Life Scores   Health/Function Pre 21.5 %   Socioeconomic Pre 23.07 %   Psych/Spiritual Pre 24.86 %   Family Pre 24 %   GLOBAL Pre 22.85 %      PHQ-9: Recent Review Flowsheet Data    Depression screen [facility] 2/9 09/02/2016   Decreased Interest 0   Down, Depressed, Hopeless 0   PHQ - 2 Score 0     Interpretation of Total Score  Total Score Depression Severity:  1-4 = Minimal depression, 5-9 = Mild depression, 10-14 = Moderate depression, 15-19 = Moderately severe depression, 20-27 = Severe depression   Psychosocial Evaluation and Intervention:   Psychosocial Re-Evaluation:     Psychosocial Re-Evaluation    Row Name 09/11/16 1751 10/08/16 1133           Psychosocial Re-Evaluation   Current issues with None Identified  -      Comments  - -  Unable to assess Joy Williams has been out since 09/23/2016      Interventions Encouraged to attend Cardiac Rehabilitation for the exercise;Stress management education  -      Continue Psychosocial Services  No Follow up required  -         Psychosocial Discharge (Final Psychosocial Re-Evaluation):     Psychosocial Re-Evaluation - 10/08/16 1133      Psychosocial Re-Evaluation   Comments --  Unable to assess Joy Williams has been out since 09/23/2016      Vocational Rehabilitation: Provide vocational rehab assistance to qualifying candidates.   Vocational Rehab Evaluation & Intervention:      Vocational Rehab - 08/27/16 1617      Initial Vocational Rehab Evaluation & Intervention   Assessment shows need for Vocational Rehabilitation No  Joy Williams is retired      Education: Education Goals: Education classes will be provided on a weekly basis, covering required topics. Participant will state understanding/return demonstration of topics presented.  Learning Barriers/Preferences:     Learning Barriers/Preferences - 08/27/16 0901      Learning Barriers/Preferences   Learning Barriers Sight   Learning Preferences Verbal Instruction;Video;Written Material;Skilled Demonstration      Education Topics: Count Your Pulse:  -Group instruction provided by verbal instruction, demonstration, patient participation and written materials to support subject.  Instructors address importance of being able to find your pulse and how to count your pulse when at home without a heart monitor.  Patients get hands on experience counting their pulse with staff help and individually.   Heart Attack, Angina, and Risk Factor Modification:  -Group instruction provided by verbal instruction, video, and written materials to support subject.  Instructors address signs and symptoms of angina and heart attacks.    Also discuss risk factors for heart disease and how to  make changes to improve heart health risk factors.   Functional Fitness:  -Group instruction provided by verbal instruction, demonstration, patient participation, and written materials to support subject.  Instructors address safety measures for doing things around the house.  Discuss how to get up and down off the floor, how to pick things up properly, how to safely get out of a chair without assistance, and balance training.   Meditation and Mindfulness:  -Group instruction provided by verbal instruction, patient participation, and written materials to support subject.  Instructor addresses importance of mindfulness and meditation  practice to help reduce stress and improve awareness.  Instructor also leads participants through a meditation exercise.    Stretching for Flexibility and Mobility:  -Group instruction provided by verbal instruction, patient participation, and written materials to support subject.  Instructors lead participants through series of stretches that are designed to increase flexibility thus improving mobility.  These stretches are additional exercise for major muscle groups that are typically performed during regular warm up and cool down.   Hands Only CPR:  -Group verbal, video, and participation provides a basic overview of AHA guidelines for community CPR. Role-play of emergencies allow participants the opportunity to practice calling for help and chest compression technique with discussion of AED use.   Hypertension: -Group verbal and written instruction that provides a basic overview of hypertension including the most recent diagnostic guidelines, risk factor reduction with self-care instructions and medication management.    Nutrition I class: Heart Healthy Eating:  -Group instruction provided by PowerPoint slides, verbal discussion, and written materials to support subject matter. The instructor gives an explanation and review of the Therapeutic Lifestyle Changes diet recommendations, which includes a discussion on lipid goals, dietary fat, sodium, fiber, plant stanol/sterol esters, sugar, and the components of a well-balanced, healthy diet.   Nutrition II class: Lifestyle Skills:  -Group instruction provided by PowerPoint slides, verbal discussion, and written materials to support subject matter. The instructor gives an explanation and review of label reading, grocery shopping for heart health, heart healthy recipe modifications, and ways to make healthier choices when eating out.   Diabetes Question & Answer:  -Group instruction provided by PowerPoint slides, verbal discussion, and  written materials to support subject matter. The instructor gives an explanation and review of diabetes co-morbidities, pre- and post-prandial blood glucose goals, pre-exercise blood glucose goals, signs, symptoms, and treatment of hypoglycemia and hyperglycemia, and foot care basics.   Diabetes Blitz:  -Group instruction provided by PowerPoint slides, verbal discussion, and written materials to support subject matter. The instructor gives an explanation and review of the physiology behind type 1 and type 2 diabetes, diabetes medications and rational behind using different medications, pre- and post-prandial blood glucose recommendations and Hemoglobin A1c goals, diabetes diet, and exercise including blood glucose guidelines for exercising safely.    Portion Distortion:  -Group instruction provided by PowerPoint slides, verbal discussion, written materials, and food models to support subject matter. The instructor gives an explanation of serving size versus portion size, changes in portions sizes over the last 20 years, and what consists of a serving from each food group.   Stress Management:  -Group instruction provided by verbal instruction, video, and written materials to support subject matter.  Instructors review role of stress in heart disease and how to cope with stress positively.     Exercising on Your Own:  -Group instruction provided by verbal instruction, power point, and written materials to support subject.  Instructors discuss benefits of exercise, components  of exercise, frequency and intensity of exercise, and end points for exercise.  Also discuss use of nitroglycerin and activating EMS.  Review options of places to exercise outside of rehab.  Review guidelines for sex with heart disease.   Cardiac Drugs I:  -Group instruction provided by verbal instruction and written materials to support subject.  Instructor reviews cardiac drug classes: antiplatelets, anticoagulants, beta  blockers, and statins.  Instructor discusses reasons, side effects, and lifestyle considerations for each drug class.   Cardiac Drugs II:  -Group instruction provided by verbal instruction and written materials to support subject.  Instructor reviews cardiac drug classes: angiotensin converting enzyme inhibitors (ACE-I), angiotensin II receptor blockers (ARBs), nitrates, and calcium channel blockers.  Instructor discusses reasons, side effects, and lifestyle considerations for each drug class.   Anatomy and Physiology of the Circulatory System:  Group verbal and written instruction and models provide basic cardiac anatomy and physiology, with the coronary electrical and arterial systems. Review of: AMI, Angina, Valve disease, Heart Failure, Peripheral Artery Disease, Cardiac Arrhythmia, Pacemakers, and the ICD.   Other Education:  -Group or individual verbal, written, or video instructions that support the educational goals of the cardiac rehab program.   Knowledge Questionnaire Score:     Knowledge Questionnaire Score - 08/27/16 1109      Knowledge Questionnaire Score   Pre Score 23/28      Core Components/Risk Factors/Patient Goals at Admission:     Personal Goals and Risk Factors at Admission - 08/27/16 1220      Core Components/Risk Factors/Patient Goals on Admission   Heart Failure Yes   Intervention Provide a combined exercise and nutrition program that is supplemented with education, support and counseling about heart failure. Directed toward relieving symptoms such as shortness of breath, decreased exercise tolerance, and extremity edema.   Expected Outcomes Improve functional capacity of life;Short term: Attendance in program 2-3 days a week with increased exercise capacity. Reported lower sodium intake. Reported increased fruit and vegetable intake. Reports medication compliance.;Short term: Daily weights obtained and reported for increase. Utilizing diuretic protocols set  by physician.;Long term: Adoption of self-care skills and reduction of barriers for early signs and symptoms recognition and intervention leading to self-care maintenance.      Core Components/Risk Factors/Patient Goals Review:    Core Components/Risk Factors/Patient Goals at Discharge (Final Review):    ITP Comments:     ITP Comments    Row Name 08/27/16 0836           ITP Comments Medical Director:  Dr. Fransico Him           Comments: Joy Williams is making expected progress toward personal goals after completing 9sessions. Recommend continued exercise and life style modification education including  stress management and relaxation techniques to decrease cardiac risk profile. [name]'s last day of exercise was on 09/23/2016. Joy Williams has been out with problems with neck and back pain.Barnet Pall, RN,BSN 10/09/2016 12:23 PM

## 2016-10-09 ENCOUNTER — Encounter (HOSPITAL_COMMUNITY): Payer: Medicare Other

## 2016-10-09 NOTE — Addendum Note (Signed)
Encounter addended by: Meta Hatchet on: 10/09/2016  8:36 AM<BR>    Actions taken: Visit Navigator Flowsheet section accepted

## 2016-10-11 ENCOUNTER — Encounter (HOSPITAL_COMMUNITY): Payer: Medicare Other

## 2016-10-16 ENCOUNTER — Encounter (HOSPITAL_COMMUNITY): Admission: RE | Admit: 2016-10-16 | Payer: Medicare Other | Source: Ambulatory Visit

## 2016-10-18 ENCOUNTER — Encounter (HOSPITAL_COMMUNITY): Payer: Medicare Other

## 2016-10-21 ENCOUNTER — Ambulatory Visit: Payer: Medicare Other | Admitting: Cardiology

## 2016-10-21 ENCOUNTER — Telehealth (HOSPITAL_COMMUNITY): Payer: Self-pay | Admitting: *Deleted

## 2016-10-21 ENCOUNTER — Encounter (HOSPITAL_COMMUNITY): Payer: Medicare Other

## 2016-10-21 NOTE — Progress Notes (Addendum)
Discharge Summary  Patient Details  Name: Joy Williams MRN: [id number] Date of Birth: January 23, 1951 Referring Provider:     [facility] from 08/27/2016 in [place]  Referring Provider  Ottie Glazier MD       Number of Visits: 8  Reason for Discharge:  Early Exit:  non attendance  Smoking History:  History  Smoking Status  . Former Smoker  . Packs/day: 0.25  . Years: 24.00  . Types: E-cigarettes, Cigarettes  . Quit date: 06/27/2016  Smokeless Tobacco  . Never Used    Diagnosis:  S/P mitral valve replacement  ADL UCSD:   Initial Exercise Prescription:     Initial Exercise Prescription - 08/27/16 1200      Date of Initial Exercise RX and Referring Provider   Date 08/27/16   Referring Provider Ottie Glazier MD     Treadmill   MPH 2.4   Grade 2   Minutes 10   METs 3.5     Bike   Level 0.9   Minutes 10   METs 3.96     NuStep   Level 3   SPM 80   Minutes 10   METs 2     Prescription Details   Frequency (times per week) 3   Duration Progress to 30 minutes of continuous aerobic without signs/symptoms of physical distress     Intensity   THRR 40-80% of Max Heartrate 62-124   Ratings of Perceived Exertion 11-13   Perceived Dyspnea 0-4     Progression   Progression Continue to progress workloads to maintain intensity without signs/symptoms of physical distress.     Resistance Training   Training Prescription Yes   Weight 2lbs   Reps 10-15      Discharge Exercise Prescription (Final Exercise Prescription Changes):     Exercise Prescription Changes - 10/24/16 0700      Response to Exercise   Blood Pressure (Admit) 97/62   Blood Pressure (Exercise) 135/73   Blood Pressure (Exit) 108/73   Heart Rate (Admit) 99 bpm   Heart Rate (Exercise) 118 bpm   Heart Rate (Exit) 91 bpm   Rating of Perceived Exertion (Exercise) 12   Duration Progress to 45 minutes of aerobic exercise without  signs/symptoms of physical distress   Intensity THRR unchanged     Progression   Progression Continue to progress workloads to maintain intensity without signs/symptoms of physical distress.   Average METs 3.1     Resistance Training   Training Prescription Yes   Weight 2lbs   Reps 10-15     Bike   Level 0.9   Minutes 10   METs 3.96     NuStep   Level 3   SPM 80   Minutes 10   METs 2.8     Track   Laps 14   Minutes 10   METs 3.43     Home Exercise Plan   Plans to continue exercise at Home (comment)   Frequency Add 3 additional days to program exercise sessions.   Initial Home Exercises Provided 09/11/16      Functional Capacity:     6 Minute Walk    Row Name 08/27/16 1001 08/27/16 1205       6 Minute Walk   Phase Initial  -    Distance 1572 feet  -    Walk Time 6 minutes  -    # of Rest Breaks 0  -  MPH  - 2.98    METS  - 3.97    RPE 11 11    VO2 Peak  - 13.88    Symptoms No  -    Resting HR 87 bpm  -    Resting BP 104/64  -    Max Ex. HR 110 bpm  -    Max Ex. BP 106/62  -    2 Minute Post BP  - 98/60       Psychological, QOL, Others - Outcomes: PHQ 2/9: Depression screen PHQ 2/9 09/02/2016  Decreased Interest 0  Down, Depressed, Hopeless 0  PHQ - 2 Score 0    Quality of Life:     Quality of Life - 08/27/16 1109      Quality of Life Scores   Health/Function Pre 21.5 %   Socioeconomic Pre 23.07 %   Psych/Spiritual Pre 24.86 %   Family Pre 24 %   GLOBAL Pre 22.85 %      Personal Goals: Goals established at orientation with interventions provided to work toward goal.     Personal Goals and Risk Factors at Admission - 08/27/16 1220      Core Components/Risk Factors/Patient Goals on Admission   Heart Failure Yes   Intervention Provide a combined exercise and nutrition program that is supplemented with education, support and counseling about heart failure. Directed toward relieving symptoms such as shortness of breath, decreased  exercise tolerance, and extremity edema.   Expected Outcomes Improve functional capacity of life;Short term: Attendance in program 2-3 days a week with increased exercise capacity. Reported lower sodium intake. Reported increased fruit and vegetable intake. Reports medication compliance.;Short term: Daily weights obtained and reported for increase. Utilizing diuretic protocols set by physician.;Long term: Adoption of self-care skills and reduction of barriers for early signs and symptoms recognition and intervention leading to self-care maintenance.       Personal Goals Discharge:   Nutrition & Weight - Outcomes:     Pre Biometrics - 08/27/16 1624      Pre Biometrics   Height 5\' 7"  (1.702 m)   Weight 128 lb 8.5 oz (58.3 kg)   Waist Circumference 28 inches   Hip Circumference 34.5 inches   Waist to Hip Ratio 0.81 %   BMI (Calculated) 20.2   Triceps Skinfold 20 mm   % Body Fat 30.3 %   Grip Strength 28 kg   Flexibility 0 in   Single Leg Stand 28.03 seconds         Post Biometrics - 10/24/16 0730       Post  Biometrics   Weight 127 lb 13.9 oz (58 kg)      Nutrition:     Nutrition Therapy & Goals - 09/09/16 1046      Nutrition Therapy   Diet Therapeutic Lifestyle changes   Drug/Food Interactions Coumadin/Vit K     Personal Nutrition Goals   Nutrition Goal Pt to identify and limit dietary sources of saturated fat, trans fat, and sodium.      Intervention Plan   Intervention Prescribe, educate and counsel regarding individualized specific dietary modifications aiming towards targeted core components such as weight, hypertension, lipid management, diabetes, heart failure and other comorbidities.   Expected Outcomes Short Term Goal: Understand basic principles of dietary content, such as calories, fat, sodium, cholesterol and nutrients.;Long Term Goal: Adherence to prescribed nutrition plan.      Nutrition Discharge:     Nutrition Assessments - 10/28/16 1354  MEDFICTS Scores   Pre Score 68   Post Score --  Post-MEDFICTS survey not received      Education Questionnaire Score:     Knowledge Questionnaire Score - 08/27/16 1109      Knowledge Questionnaire Score   Pre Score 23/28      Joy Williams's last day of attendance was on 09/18/2016. Joy Williams has been out with neck and back pain and reports feeling better. Joy Williams has decided to exercise on her own. Will discharge from cardiac rehab.Barnet Pall, RN,BSN 10/31/2016 11:32 AM

## 2016-10-23 ENCOUNTER — Ambulatory Visit (INDEPENDENT_AMBULATORY_CARE_PROVIDER_SITE_OTHER): Payer: Medicare Other

## 2016-10-23 ENCOUNTER — Ambulatory Visit (INDEPENDENT_AMBULATORY_CARE_PROVIDER_SITE_OTHER): Payer: Medicare Other | Admitting: Cardiology

## 2016-10-23 ENCOUNTER — Encounter (HOSPITAL_COMMUNITY)
Admission: RE | Admit: 2016-10-23 | Discharge: 2016-10-23 | Disposition: A | Discharge status: Home / Self Care | Payer: Medicare Other | Source: Ambulatory Visit | Attending: Cardiology | Admitting: Cardiology

## 2016-10-23 ENCOUNTER — Encounter: Payer: Self-pay | Admitting: *Deleted

## 2016-10-23 ENCOUNTER — Encounter: Payer: Self-pay | Admitting: Cardiology

## 2016-10-23 VITALS — BP 100/50 | HR 76 | Ht 67.0 in | Wt 126.0 lb

## 2016-10-23 DIAGNOSIS — F17210 Nicotine dependence, cigarettes, uncomplicated: Secondary | ICD-10-CM | POA: Insufficient documentation

## 2016-10-23 DIAGNOSIS — E039 Hypothyroidism, unspecified: Secondary | ICD-10-CM | POA: Insufficient documentation

## 2016-10-23 DIAGNOSIS — I6523 Occlusion and stenosis of bilateral carotid arteries: Secondary | ICD-10-CM | POA: Diagnosis not present

## 2016-10-23 DIAGNOSIS — Z952 Presence of prosthetic heart valve: Secondary | ICD-10-CM | POA: Diagnosis not present

## 2016-10-23 DIAGNOSIS — Z953 Presence of xenogenic heart valve: Secondary | ICD-10-CM | POA: Insufficient documentation

## 2016-10-23 DIAGNOSIS — Z5181 Encounter for therapeutic drug level monitoring: Secondary | ICD-10-CM

## 2016-10-23 DIAGNOSIS — Z7951 Long term (current) use of inhaled steroids: Secondary | ICD-10-CM | POA: Insufficient documentation

## 2016-10-23 DIAGNOSIS — I519 Heart disease, unspecified: Secondary | ICD-10-CM

## 2016-10-23 DIAGNOSIS — Z85118 Personal history of other malignant neoplasm of bronchus and lung: Secondary | ICD-10-CM | POA: Insufficient documentation

## 2016-10-23 DIAGNOSIS — J449 Chronic obstructive pulmonary disease, unspecified: Secondary | ICD-10-CM | POA: Insufficient documentation

## 2016-10-23 DIAGNOSIS — I5022 Chronic systolic (congestive) heart failure: Secondary | ICD-10-CM | POA: Insufficient documentation

## 2016-10-23 DIAGNOSIS — I5189 Other ill-defined heart diseases: Secondary | ICD-10-CM

## 2016-10-23 DIAGNOSIS — Z79899 Other long term (current) drug therapy: Secondary | ICD-10-CM | POA: Insufficient documentation

## 2016-10-23 DIAGNOSIS — Z9221 Personal history of antineoplastic chemotherapy: Secondary | ICD-10-CM | POA: Insufficient documentation

## 2016-10-23 DIAGNOSIS — Z923 Personal history of irradiation: Secondary | ICD-10-CM | POA: Insufficient documentation

## 2016-10-23 LAB — POCT INR: INR: 1.6

## 2016-10-23 MED ORDER — WARFARIN SODIUM 5 MG PO TABS: ORAL_TABLET | ORAL | 1 refills | Status: DC

## 2016-10-23 NOTE — Progress Notes (Signed)
10/23/2016 [name]   1950/05/30  [id number]  Primary Physician Velna Hatchet, MD Primary Cardiologist: Dr. 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 Roxy Manns, on 06/27/16. Her post operative course was uncomplicated. She was placed on coumadin. She is followed in our office by Dr. Meda Coffee. We also follow her INRs. Prior to undergoing valve surgery, she had a diagnostic LHC that showed normal coronaries. Her lung CA is followed at [facility]. Her most recent follow-up chest CT scan was performed in March 2017 at which time there was no sign of locally recurrent disease.  She presents back to clinic for post hospital f/u. She is here with her daughter. She reports that she has done fairly well. No chest pain. No significant dyspnea. She gets a little fatigue if she tries to do too much. After her hospitalization, she was having issues with low BP and dizziness, however Dr. Roxy Manns decrease her metoprolol from BID to once daily. She takes it at night and this has helped her. Her BP is stable in clinic today. She is w/o symptoms. EKG shows NSR. Dr. Roxy Manns has cleared her to resume driving and to start cardiac rehab.   10/23/2016 - 3 months follow-up, the patient feels great, she has completed 12 weeks of cardiac rehabilitation. The patient states that overall she feels significantly better than before surgery. She has experienced  dizziness and orthostatic hypotension and blurry vision. That has all completely resolved after she discontinued metoprolol. He was also seen by ophthalmologist. Carotid ultrasound was checked that showed 40-59% bilateral stenosis. She underwent echocardiography in May that showed hyperdynamic LVEF 65-70% normally functioning mitral valve with completely normal transmitral gradients. She denies lower extremity edema, no orthopnea no paroxysmal nocturnal dyspnea and no palpitations. No syncope. She has been compliant to her meds but her INRs were frequently under 2. No bleeding.  Current Meds  Medication Sig  . COCONUT OIL PO Take 15 mLs by mouth daily. Daily with coffee   . DULERA 100-5 MCG/ACT AERO 2 PUFFS INHALED TWICE DAILY. MUST KEEP OFFICE VISIT FOR FURTHER REFILLS.  Marland Kitchen levothyroxine (SYNTHROID, LEVOTHROID) 175 MCG tablet Take 175 mcg by mouth daily before breakfast.  . Multiple Vitamins-Minerals (MULTIVITAMIN WITH MINERALS) tablet Take 1 tablet by mouth daily.  Marland Kitchen warfarin (COUMADIN) 5 MG tablet Take as directed by Coumadin clinic.   Allergies  Allergen Reactions  . Penicillins Hives    Unknown, occurred as a child Has patient had a PCN reaction causing immediate rash, facial/tongue/throat swelling, SOB or lightheadedness with hypotension: Yes Has patient had a PCN reaction causing severe rash involving mucus membranes or skin necrosis: No Has patient had a PCN reaction that required hospitalization  Has patient had a PCN reaction occurring within the last 10 years: No If all of the above answers are "NO", then may proceed with Cephalosporin use. No   Past Medical History:  Diagnosis Date  . Allergy   . Asthma    pt denies this, but is on Dulera  .  Chronic diastolic congestive heart failure ([place])   . 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 COPD (chronic obstructive pulmonary  disease) ([place])   . Family history of adverse reaction to anesthesia    Mother- very sensitive to medication  . Heart murmur    MVP  . Hypothyroidism   . Lung cancer ([place]) 12/30/2014   Synchronous primaries:  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, each treated by wedge resection with post-op adjuvant [facility]Rx at [facility]  . MVP (mitral valve prolapse)   . PONV (postoperative nausea and vomiting)   . Raynaud's disease    Raynaud's  . Raynaud's syndrome   . 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   31 mm [facility] mitral bovine bioprosthetic tissue valve placed via right mini thoracotomy approach  . Severe mitral regurgitation 11/15/2014  . Shortness of breath dyspnea    with exertion  . Telangiectasia   . Thyroid disease    Family History  Problem Relation Age of Onset  . Mitral valve prolapse Mother   . Dementia Father   . Prostate cancer Father   . Mitral valve prolapse Brother   . Mitral valve prolapse Sister   . Colon cancer Neg Hx   . Colon polyps Neg Hx   . Rectal cancer Neg Hx   . Esophageal cancer Neg Hx   . Stomach cancer Neg Hx   . Heart attack Neg Hx    Past Surgical History:  Procedure Laterality Date  . BACK SURGERY     x 3  Disectomy  . CARDIAC CATHETERIZATION N/A 03/08/2016   Procedure: Right/Left Heart Cath and Coronary Angiography;  Surgeon: Sherren Mocha, MD;  Location: [facility];  Service: Cardiovascular;  Laterality: N/A;  . CLAVICLE SURGERY Left 2013   plate to left collar bone  . LAPAROSCOPY     ? reason-age 53   . MITRAL VALVE REPAIR Right 06/27/2016   Procedure: MINIMALLY INVASIVE MITRAL VALVE REPLACEMENT;  Surgeon: Rexene Alberts, MD;  Location: [place];  Service: Open Heart Surgery;  Laterality: Right;  . TEE WITHOUT CARDIOVERSION N/A 02/22/2016   Procedure: TRANSESOPHAGEAL ECHOCARDIOGRAM (TEE);  Surgeon: Dorothy Spark, MD;  Location: [place];  Service: Cardiovascular;   Laterality: N/A;  . TEE WITHOUT CARDIOVERSION N/A 06/27/2016   Procedure: TRANSESOPHAGEAL ECHOCARDIOGRAM (TEE);  Surgeon: Rexene Alberts, MD;  Location: [place];  Service: Open Heart Surgery;  Laterality: N/A;  . TONSILLECTOMY    . VIDEO ASSISTED THORACOSCOPY (VATS)/WEDGE RESECTION Left 12/30/2014   Bronchoscopy, Mediastinoscopy, Left VATS for Wedge resection LUL x2 adn LLL x1 - Dr. Elenor Quinones at [facility]  . wrist surgery Left 2015   plate to wrist    Social History   Social History  . Marital status: Divorced    Spouse name: N/A  . Number of children: N/A  . Years of education: N/A   Occupational History  . Not on file.   Social History Main Topics  . Smoking status: Former Smoker    Packs/day: 0.25    Years: 24.00    Types: E-cigarettes, Cigarettes    Quit date: 06/27/2016  . Smokeless tobacco: Never Used  . Alcohol use 0.6 oz/week    1 Shots of liquor per week     Comment: socially  . Drug use: No  . Sexual activity: Not  on file   Other Topics Concern  . Not on file   Social History Narrative  . No narrative on file     Review of Systems: General: negative for chills, fever, night sweats or weight changes.  Cardiovascular: negative for chest pain, dyspnea on exertion, edema, orthopnea, palpitations, paroxysmal nocturnal dyspnea or shortness of breath Dermatological: negative for rash Respiratory: negative for cough or wheezing Urologic: negative for hematuria Abdominal: negative for nausea, vomiting, diarrhea, bright red blood per rectum, melena, or hematemesis Neurologic: negative for visual changes, syncope, or dizziness All other systems reviewed and are otherwise negative except as noted above.   Physical Exam:  Blood pressure (!) 100/50, pulse 76, height 5\' 7"  (1.702 m), weight 126 lb (57.2 kg).  General appearance: alert, cooperative, no distress and thin appearing  Neck: no carotid bruit and no JVD Lungs: clear to auscultation bilaterally Heart: regular rate and  rhythm, S1, S2 normal, no murmur, click, rub or gallop Extremities: extremities normal, atraumatic, no cyanosis or edema Pulses: 2+ and symmetric Skin: Skin color, texture, turgor normal. No rashes or lesions Neurologic: Grossly normal  TTE: 09/26/2016 Left ventricle: The cavity size was normal. There was mild   concentric hypertrophy. Systolic function was vigorous. The   estimated ejection fraction was in the range of 65% to 70%. Wall   motion was normal; there were no regional wall motion   abnormalities. Features are consistent with a pseudonormal left   ventricular filling pattern, with concomitant abnormal relaxation   and increased filling pressure (grade 2 diastolic dysfunction).   Doppler parameters are consistent with high ventricular filling   pressure. - Aortic valve: Trileaflet; mildly thickened, mildly calcified   leaflets. - Mitral valve: A bioprosthesis was present and functioning   normally. - Atrial septum: There was increased thickness of the septum,   consistent with lipomatous hypertrophy. - Pulmonary arteries: Systolic pressure could not be accurately   estimated.  Impressions: - No cardiac source of emboli was indentified.  EKG NSR. No ischemia.     ASSESSMENT AND PLAN:   1. H/o MVP with MR: s/p recent minimally invasive mitral valve replacement with a bioprosthetic valve, per Dr. Roxy Manns, on 06/27/16. TEE during operation showed normal LVEF of 60-65%. 3 months follow-up echocardiogram in May 2018 showed hyperdynamic LVEF 65-70% with grade 2 diastolic dysfunction and normal transmitral gradient. Normal right-sided pressures. She follows with Dr. Roxy Manns next week. He will decide about necessity to continue warfarin.  2. Grade 2 diastolic dysfunction - the patient is euvolemic, she is normal blood pressure and normal cholesterol. She is functional class I.  3. H/o Lung Cancer: treated by and followed at [facility]. Last CT scan showed no recurrence.   4. Bilateral  carotid stenosis - moderate on carotid ultrasound in April 2018 with 40-59% stenosis bilaterally. We will recheck in one year.  PLAN: 6 months follow up.  Ena Dawley, MD 10/23/2016 9:25 AM

## 2016-10-23 NOTE — Patient Instructions (Signed)

## 2016-10-24 NOTE — Addendum Note (Signed)
Encounter addended by: Meta Hatchet on: 10/24/2016  7:34 AM<BR>    Actions taken: Flowsheet data copied forward, Flowsheet accepted, Visit Navigator Flowsheet section accepted

## 2016-10-25 ENCOUNTER — Encounter (HOSPITAL_COMMUNITY): Admission: RE | Admit: 2016-10-25 | Payer: Medicare Other | Source: Ambulatory Visit

## 2016-10-28 ENCOUNTER — Encounter (HOSPITAL_COMMUNITY): Payer: Medicare Other

## 2016-10-28 NOTE — Addendum Note (Signed)
Encounter addended by: Jewel Baize, RD on: 10/28/2016  1:55 PM<BR>    Actions taken: Flowsheet data copied forward, Visit Navigator Flowsheet section accepted

## 2016-10-30 ENCOUNTER — Encounter (HOSPITAL_COMMUNITY): Payer: Medicare Other

## 2016-11-01 ENCOUNTER — Encounter (HOSPITAL_COMMUNITY): Payer: Medicare Other

## 2016-11-04 ENCOUNTER — Encounter: Payer: Self-pay | Admitting: Thoracic Surgery (Cardiothoracic Vascular Surgery)

## 2016-11-04 ENCOUNTER — Encounter (HOSPITAL_COMMUNITY): Payer: Medicare Other

## 2016-11-04 ENCOUNTER — Ambulatory Visit (INDEPENDENT_AMBULATORY_CARE_PROVIDER_SITE_OTHER): Payer: Medicare Other | Admitting: Thoracic Surgery (Cardiothoracic Vascular Surgery)

## 2016-11-04 VITALS — BP 102/62 | HR 76 | Resp 20 | Ht 67.0 in | Wt 125.6 lb

## 2016-11-04 DIAGNOSIS — Z953 Presence of xenogenic heart valve: Secondary | ICD-10-CM

## 2016-11-04 DIAGNOSIS — I340 Nonrheumatic mitral (valve) insufficiency: Secondary | ICD-10-CM

## 2016-11-04 DIAGNOSIS — Z952 Presence of prosthetic heart valve: Secondary | ICD-10-CM | POA: Diagnosis not present

## 2016-11-04 DIAGNOSIS — I6523 Occlusion and stenosis of bilateral carotid arteries: Secondary | ICD-10-CM | POA: Diagnosis not present

## 2016-11-04 MED ORDER — ASPIRIN EC 81 MG PO TBEC: 81.0000 mg | DELAYED_RELEASE_TABLET | Freq: Every day | ORAL | Status: DC

## 2016-11-04 NOTE — Patient Instructions (Addendum)
Stop taking warfarin and resume taking aspirin.  Continue all other previous medications without any changes at this time  You may resume unrestricted physical activity without any particular limitations at this time.  Endocarditis is a potentially serious infection of heart valves or inside lining of the heart.  It occurs more commonly in patients with diseased heart valves (such as patient's with aortic or mitral valve disease) and in patients who have undergone heart valve repair or replacement.  Certain surgical and dental procedures may put you at risk, such as dental cleaning, other dental procedures, or any surgery involving the respiratory, urinary, gastrointestinal tract, gallbladder or prostate gland.   To minimize your chances for develooping endocarditis, maintain good oral health and seek prompt medical attention for any infections involving the mouth, teeth, gums, skin or urinary tract.    Always notify your doctor or dentist about your underlying heart valve condition before having any invasive procedures. You will need to take antibiotics before certain procedures, including all routine dental cleanings or other dental procedures.  Your cardiologist or dentist should prescribe these antibiotics for you to be taken ahead of time.

## 2016-11-04 NOTE — Progress Notes (Signed)
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 She was last seen here in our office on favored 26 2018 at which time she was doing fairly well. Since then she completed 3 months of outpatient cardiac rehabilitation. She had a routine follow-up echocardiogram performed 09/26/2016 that revealed hyperdynamic left ventricular function with ejection fraction estimated 65-70%. The mitral valve prosthesis was functioning normally with no mitral regurgitation. No other significant abnormalities were noted.  She was seen in follow-up by Dr. Meda Coffee on 10/23/2016 and she returns to our office today for routine follow-up.  She states that she is doing remarkably well. She has return to normal activity and she reports no symptoms of exertional shortness of breath whatsoever. She states that her breathing and exercise tolerance is much better than it was prior surgery.  He no longer has any discomfort in her chest. She is hopeful that she can stop taking warfarin. She has continued to abstain from any tobacco use.   Current Outpatient Prescriptions  Medication Sig Dispense Refill  . budesonide-formoterol (SYMBICORT) 80-4.5 MCG/ACT inhaler Inhale 2 puffs into the lungs 2 (two) times daily. 1 Inhaler 11  . COCONUT OIL PO Take 15 mLs by mouth daily. Daily with coffee     . DULERA 100-5 MCG/ACT AERO 2 PUFFS INHALED TWICE DAILY. MUST KEEP OFFICE VISIT FOR FURTHER REFILLS.  0  .  levothyroxine (SYNTHROID, LEVOTHROID) 175 MCG tablet Take 175 mcg by mouth daily before breakfast.    . Multiple Vitamins-Minerals (MULTIVITAMIN WITH MINERALS) tablet Take 1 tablet by mouth daily.    Marland Kitchen warfarin (COUMADIN) 5 MG tablet Take as directed by Coumadin clinic. 45 tablet 1   No current facility-administered medications for this visit.       Physical Exam:   BP 102/62   Pulse 76   Resp 20   Ht 5\' 7"  (1.702 m)   Wt 125 lb 9.6 oz (57 kg)   SpO2 93%   BMI 19.67 kg/m   General:  Well-appearing  Chest:   Clear to auscultation  CV:   Regular rate and rhythm without murmur  Incisions:  Completely healed  Abdomen:  Soft nontender  Extremities:  Warm and well-perfused  Diagnostic Tests:  Transthoracic Echocardiography  Patient:    Joy Williams, Joy Williams MR #:       [id number] Study Date: 09/26/2016 Gender:     F Age:        65 Height:     170.2 cm Weight:     58.5 kg BSA:        1.66 m^2 Pt. Status: Room:   ATTENDING    Fransico Him, MD  SONOGRAPHER  [facility]el  ORDERING     Bowen, Stana Bunting    [facility], [facility]    [place], [place] [phone]  PERFORMING   [facility], Outpatient  cc:  ------------------------------------------------------------------- LV EF: 65% -   70%  ------------------------------------------------------------------- Indications:  Subjective visual disturbance - H53.10.  ------------------------------------------------------------------- History:   PMH:  Mitral valve replacement.  Risk factors:  Family history of coronary artery disease. Current tobacco use.  ------------------------------------------------------------------- Study Conclusions  - Left ventricle: The cavity size was normal. There was mild   concentric hypertrophy. Systolic function was vigorous. The   estimated ejection fraction was in the range of 65% to 70%. Wall   motion was normal; there were no regional wall motion   abnormalities. Features are  consistent with a pseudonormal left   ventricular filling pattern, with concomitant abnormal relaxation   and increased filling pressure (grade 2 diastolic dysfunction).   Doppler parameters are consistent with high ventricular filling   pressure. - Aortic valve: Trileaflet; mildly thickened, mildly calcified   leaflets. - Mitral valve: A bioprosthesis was present and functioning   normally. - Atrial septum: There was increased thickness of the septum,   consistent with lipomatous hypertrophy. - Pulmonary arteries: Systolic pressure could not be accurately   estimated.  Impressions:  - No cardiac source of emboli was indentified.  Recommendations:  Consider transesophageal echocardiography if clinically indicated in order to exclude intracardiac thrombus.   ------------------------------------------------------------------- Study data:  Comparison was made to the study of 06/27/2016.  Study status:  Routine.  Procedure:  Transthoracic echocardiography. Image quality was adequate.  Study completion:  There were no complications.          Transthoracic echocardiography.  M-mode, complete 2D, spectral Doppler, and color Doppler.  Birthdate: Patient birthdate: 1950/12/12.  Age:  Patient is 66 yr old.  Sex: Gender: female.    BMI: 20.2 kg/m^2.  Blood pressure:     104/62 Patient status:  Outpatient.  Study date:  Study date: 09/26/2016. Study time: 10:56 AM.  Location:  [facility]  -------------------------------------------------------------------  ------------------------------------------------------------------- Left ventricle:  The cavity size was normal. There was mild concentric hypertrophy. Systolic function was vigorous. The estimated ejection fraction was in the range of 65% to 70%. Wall motion was normal; there were no regional wall motion abnormalities. Features are consistent with a pseudonormal left ventricular filling pattern, with concomitant abnormal  relaxation and increased filling pressure (grade 2 diastolic dysfunction). Doppler parameters are consistent with high ventricular filling pressure.  ------------------------------------------------------------------- Aortic valve:   Trileaflet; mildly thickened, mildly calcified leaflets. Mobility was not restricted.  Doppler:  Transvalvular velocity was within the normal range. There was no stenosis. There was no regurgitation.  ------------------------------------------------------------------- Aorta:  Aortic root: The aortic root was normal in size.  ------------------------------------------------------------------- Mitral valve:  A bioprosthesis was present and functioning normally. Mobility was not restricted.  Doppler:  Transvalvular velocity was within the normal range. There was no evidence for stenosis. There was no regurgitation.    Peak gradient (D): 3 mm Hg.  ------------------------------------------------------------------- Left atrium:  The atrium was normal in size.  ------------------------------------------------------------------- Atrial septum:  There was increased thickness of the septum, consistent with lipomatous hypertrophy.  ------------------------------------------------------------------- Right ventricle:  The cavity size was normal. Wall thickness was normal. Systolic function was normal.  ------------------------------------------------------------------- Pulmonic valve:    Structurally normal valve.   Cusp separation was normal.  Doppler:  Transvalvular velocity was within the normal range. There was no evidence for stenosis. There was no regurgitation.  ------------------------------------------------------------------- Tricuspid valve:   Structurally normal valve.    Doppler: Transvalvular velocity was within the normal range. There was trivial  regurgitation.  ------------------------------------------------------------------- Pulmonary artery:   The main pulmonary artery was normal-sized. Systolic pressure could not be accurately estimated.  -------------------------------------------------------------------  Right atrium:  The atrium was normal in size.  ------------------------------------------------------------------- Pericardium:  There was no pericardial effusion.  ------------------------------------------------------------------- Systemic veins: Inferior vena cava: The vessel was normal in size.  ------------------------------------------------------------------- Measurements   Left ventricle                           Value        Reference  LV ID, ED, PLAX chordal        (L)       32.6  mm     43 - 52  LV ID, ES, PLAX chordal        (L)       18.6  mm     23 - 38  LV fx shortening, PLAX chordal           43    %      >=29  LV PW thickness, ED                      13.4  mm     ---------  IVS/LV PW ratio, ED                      0.87         <=1.3  LV e&', lateral                           4.59  cm/s   ---------  LV E/e&', lateral                         17.73        ---------  LV e&', medial                            3.38  cm/s   ---------  LV E/e&', medial                          24.08        ---------  LV e&', average                           3.99  cm/s   ---------  LV E/e&', average                         20.43        ---------    Ventricular septum                       Value        Reference  IVS thickness, ED                        11.6  mm     ---------    LVOT                                     Value        Reference  LVOT ID, S  16    mm     ---------  LVOT area                                2.01  cm^2   ---------    Aorta                                    Value        Reference  Aortic root ID, ED                       33    mm     ---------    Left atrium                               Value        Reference  LA ID, A-P, ES                           29    mm     ---------  LA ID/bsa, A-P                           1.75  cm/m^2 <=2.2    Mitral valve                             Value        Reference  Mitral E-wave peak velocity              81.4  cm/s   ---------  Mitral A-wave peak velocity              90.8  cm/s   ---------  Mitral deceleration time       (H)       525   ms     150 - 230  Mitral peak gradient, D                  3     mm Hg  ---------  Mitral E/A ratio, peak                   0.9          ---------    Right ventricle                          Value        Reference  RV s&', lateral, S                        9.63  cm/s   ---------  Legend: (L)  and  (H)  mark values outside specified reference range.  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  Plan:  The patient has been advised that she may stop taking warfarin. She should resume taking aspirin 81 mg daily. No other recommendations for changes in her medications have been made. She may resume unrestricted physical activity without any  particular limitations.  The patient has been reminded regarding the importance of dental hygiene and the lifelong need for antibiotic prophylaxis for all dental cleanings and other related invasive procedures.  She will return to our office for routine follow-up next February, approximately 1 year following her surgery. She will call and return sooner only should specific problems or questions arise.    I spent in excess of 15 minutes during the conduct of this office consultation and >50% of this time involved direct face-to-face encounter with the patient for counseling and/or  coordination of their care.    Valentina Gu. Roxy Manns, MD 11/04/2016 12:02 PM

## 2016-11-06 ENCOUNTER — Encounter (HOSPITAL_COMMUNITY): Payer: Medicare Other

## 2016-11-07 DIAGNOSIS — Z952 Presence of prosthetic heart valve: Secondary | ICD-10-CM | POA: Diagnosis not present

## 2016-11-07 DIAGNOSIS — R911 Solitary pulmonary nodule: Secondary | ICD-10-CM | POA: Diagnosis not present

## 2016-11-07 DIAGNOSIS — Z85118 Personal history of other malignant neoplasm of bronchus and lung: Secondary | ICD-10-CM | POA: Diagnosis not present

## 2016-11-07 DIAGNOSIS — Z08 Encounter for follow-up examination after completed treatment for malignant neoplasm: Secondary | ICD-10-CM | POA: Diagnosis not present

## 2016-11-07 DIAGNOSIS — Z902 Acquired absence of lung [part of]: Secondary | ICD-10-CM | POA: Diagnosis not present

## 2016-11-07 DIAGNOSIS — Z9889 Other specified postprocedural states: Secondary | ICD-10-CM | POA: Diagnosis not present

## 2016-11-07 DIAGNOSIS — C3482 Malignant neoplasm of overlapping sites of left bronchus and lung: Secondary | ICD-10-CM | POA: Diagnosis not present

## 2016-11-07 DIAGNOSIS — Z87891 Personal history of nicotine dependence: Secondary | ICD-10-CM | POA: Diagnosis not present

## 2016-11-07 DIAGNOSIS — C3490 Malignant neoplasm of unspecified part of unspecified bronchus or lung: Secondary | ICD-10-CM | POA: Diagnosis not present

## 2016-11-08 ENCOUNTER — Encounter (HOSPITAL_COMMUNITY): Payer: Medicare Other

## 2016-11-11 ENCOUNTER — Encounter (HOSPITAL_COMMUNITY): Payer: Medicare Other

## 2016-11-13 ENCOUNTER — Encounter (HOSPITAL_COMMUNITY): Payer: Medicare Other

## 2016-11-15 ENCOUNTER — Encounter (HOSPITAL_COMMUNITY): Payer: Medicare Other

## 2016-11-18 ENCOUNTER — Encounter (HOSPITAL_COMMUNITY): Payer: Medicare Other

## 2016-11-22 ENCOUNTER — Encounter (HOSPITAL_COMMUNITY): Payer: Medicare Other

## 2016-11-25 ENCOUNTER — Encounter (HOSPITAL_COMMUNITY): Payer: Medicare Other

## 2016-11-27 ENCOUNTER — Encounter (HOSPITAL_COMMUNITY): Payer: Medicare Other

## 2016-11-29 ENCOUNTER — Encounter (HOSPITAL_COMMUNITY): Payer: Medicare Other

## 2016-12-02 ENCOUNTER — Encounter (HOSPITAL_COMMUNITY): Payer: Medicare Other

## 2016-12-04 ENCOUNTER — Encounter (HOSPITAL_COMMUNITY): Payer: Medicare Other

## 2016-12-06 ENCOUNTER — Encounter (HOSPITAL_COMMUNITY): Payer: Medicare Other

## 2016-12-09 ENCOUNTER — Encounter (HOSPITAL_COMMUNITY): Payer: Medicare Other

## 2016-12-11 ENCOUNTER — Encounter (HOSPITAL_COMMUNITY): Payer: Medicare Other

## 2016-12-13 ENCOUNTER — Encounter (HOSPITAL_COMMUNITY): Payer: Medicare Other

## 2016-12-16 ENCOUNTER — Encounter (HOSPITAL_COMMUNITY): Payer: Medicare Other

## 2016-12-18 ENCOUNTER — Encounter (HOSPITAL_COMMUNITY): Payer: Medicare Other

## 2016-12-18 DIAGNOSIS — H903 Sensorineural hearing loss, bilateral: Secondary | ICD-10-CM | POA: Diagnosis not present

## 2016-12-20 ENCOUNTER — Encounter (HOSPITAL_COMMUNITY): Payer: Medicare Other

## 2016-12-23 ENCOUNTER — Encounter (HOSPITAL_COMMUNITY): Payer: Medicare Other

## 2016-12-25 ENCOUNTER — Encounter (HOSPITAL_COMMUNITY): Payer: Medicare Other

## 2016-12-27 ENCOUNTER — Encounter (HOSPITAL_COMMUNITY): Payer: Medicare Other

## 2016-12-30 ENCOUNTER — Encounter (HOSPITAL_COMMUNITY): Payer: Medicare Other

## 2017-01-03 ENCOUNTER — Telehealth: Payer: Self-pay | Admitting: Internal Medicine

## 2017-01-03 MED ORDER — BUDESONIDE-FORMOTEROL FUMARATE 80-4.5 MCG/ACT IN AERO: 2.0000 | INHALATION_SPRAY | Freq: Two times a day (BID) | RESPIRATORY_TRACT | 11 refills | Status: DC

## 2017-01-03 NOTE — Telephone Encounter (Signed)
Called and spoke with pt. Pt states she was given a written Rx for Symbicort 80, as well as a sample at her OV on 09/17/16 Pt states she has finished her sample. Pt states she has forgotten to take her night time dosage many times, which is why the sample has lasted this long. Pt states she has misplaced her Rx, and request for a Rx to [facility] to [facility] on [address]. I have called [facility] to assure that Rx has not be filled. [facility] confirmed that Rx had not [facility] filled. Rx has been sent to preferred pharmacy. Nothing further needed.

## 2017-01-03 NOTE — Addendum Note (Signed)
Addended by: Maryanna Shape A on: 01/03/2017 12:21 PM   Modules accepted: Orders

## 2017-01-06 ENCOUNTER — Ambulatory Visit (INDEPENDENT_AMBULATORY_CARE_PROVIDER_SITE_OTHER): Payer: Medicare Other | Admitting: Family Medicine

## 2017-01-06 ENCOUNTER — Encounter: Payer: Self-pay | Admitting: Family Medicine

## 2017-01-06 VITALS — BP 119/77 | HR 81 | Ht 67.0 in | Wt 131.1 lb

## 2017-01-06 DIAGNOSIS — T451X5A Adverse effect of antineoplastic and immunosuppressive drugs, initial encounter: Secondary | ICD-10-CM

## 2017-01-06 DIAGNOSIS — E039 Hypothyroidism, unspecified: Secondary | ICD-10-CM | POA: Diagnosis not present

## 2017-01-06 DIAGNOSIS — I5032 Chronic diastolic (congestive) heart failure: Secondary | ICD-10-CM

## 2017-01-06 DIAGNOSIS — Z716 Tobacco abuse counseling: Secondary | ICD-10-CM

## 2017-01-06 DIAGNOSIS — M349 Systemic sclerosis, unspecified: Secondary | ICD-10-CM | POA: Diagnosis not present

## 2017-01-06 DIAGNOSIS — R918 Other nonspecific abnormal finding of lung field: Secondary | ICD-10-CM

## 2017-01-06 DIAGNOSIS — Z1239 Encounter for other screening for malignant neoplasm of breast: Secondary | ICD-10-CM

## 2017-01-06 DIAGNOSIS — Z13820 Encounter for screening for osteoporosis: Secondary | ICD-10-CM | POA: Diagnosis not present

## 2017-01-06 DIAGNOSIS — Z1211 Encounter for screening for malignant neoplasm of colon: Secondary | ICD-10-CM

## 2017-01-06 DIAGNOSIS — D6481 Anemia due to antineoplastic chemotherapy: Secondary | ICD-10-CM

## 2017-01-06 DIAGNOSIS — Z1231 Encounter for screening mammogram for malignant neoplasm of breast: Secondary | ICD-10-CM

## 2017-01-06 DIAGNOSIS — C3482 Malignant neoplasm of overlapping sites of left bronchus and lung: Secondary | ICD-10-CM

## 2017-01-06 DIAGNOSIS — I6523 Occlusion and stenosis of bilateral carotid arteries: Secondary | ICD-10-CM

## 2017-01-06 DIAGNOSIS — C3492 Malignant neoplasm of unspecified part of left bronchus or lung: Secondary | ICD-10-CM | POA: Diagnosis not present

## 2017-01-06 DIAGNOSIS — Z9889 Other specified postprocedural states: Secondary | ICD-10-CM | POA: Diagnosis not present

## 2017-01-06 DIAGNOSIS — D5912 Cold autoimmune hemolytic anemia: Secondary | ICD-10-CM

## 2017-01-06 DIAGNOSIS — J449 Chronic obstructive pulmonary disease, unspecified: Secondary | ICD-10-CM

## 2017-01-06 DIAGNOSIS — F17210 Nicotine dependence, cigarettes, uncomplicated: Secondary | ICD-10-CM

## 2017-01-06 DIAGNOSIS — D591 Other autoimmune hemolytic anemias: Secondary | ICD-10-CM

## 2017-01-06 DIAGNOSIS — C3490 Malignant neoplasm of unspecified part of unspecified bronchus or lung: Secondary | ICD-10-CM

## 2017-01-06 NOTE — Patient Instructions (Addendum)
Thank you for your time and patience today.  I hope you found your office visit with me encouraging and educational, as my goal is to help my patients become healthier and happier.    Please note that you may receive a survey via email or snail mail about your experience today.  Please feel free to be open and honest about your experiences with [place], as your input is valuable to [place] and will help [place] to improve our care and services.  Otherwise, please feel free to do a google review on [place] or go to [email or web address] and click on the FACEBOOK link to leave your thoughts.  Thank you for your input and in allowing [place] to care for you today!    -  Dr Raliegh Scarlet     - Make appointment for fasting blood work in the near future then you can make a follow-up to discuss it with me.  - Also please make an appointment for a complete physical exam where we would do a Pap smear, but overall of your immunizations making sure they're up to date, and your health maintenance screening tests etc.    - Also in the future since her new to Medicare, you can also make an appointment for Medicare wellness exam which is separate from the 2 appointments above   - Please realize, EXERCISE IS MEDICINE!  -  American Heart Association [facility]) guidelines for exercise : If you are in good health, without any medical conditions, you should engage in 150 minutes of moderate intensity aerobic activity per week.  This means you should be huffing and puffing throughout your workout.   Engaging in regular exercise will improve brain function and memory, as well as improve mood, boost immune system and help with weight management.  As well as the other, more well-known effects of exercise such as decreasing blood sugar levels, decreasing blood pressure,  and decreasing bad cholesterol levels/ increasing good cholesterol levels.     -  The AHA strongly endorses consumption of a diet that  contains a variety of foods from all the food categories with an emphasis on fruits and vegetables; fat-free and low-fat dairy products; cereal and grain products; legumes and nuts; and fish, poultry, and/or extra lean meats.    Excessive food intake, especially of foods high in saturated and trans fats, sugar, and salt, should be avoided.    Adequate water intake of roughly 1/2 of your weight in pounds, should equal the ounces of water per day you should drink.  So for instance, if you're 200 pounds, that would be 100 ounces of water per day.         Mediterranean Diet  Why follow it? Research shows. . Those who follow the Mediterranean diet have a reduced risk of heart disease  . The diet is associated with a reduced incidence of Parkinson's and Alzheimer's diseases . People following the diet may have longer life expectancies and lower rates of chronic diseases  . The Dietary Guidelines for Americans recommends the Mediterranean diet as an eating plan to promote health and prevent disease  What Is the Mediterranean Diet?  . Healthy eating plan based on typical foods and recipes of Mediterranean-style cooking . The diet is primarily a plant based diet; these foods should make up a majority of meals   Starches - Plant based foods should make up a majority of meals - They are an important sources of vitamins, minerals, energy, antioxidants, and  fiber - Choose whole grains, foods high in fiber and minimally processed items  - Typical grain sources include wheat, oats, barley, corn, brown rice, bulgar, farro, millet, polenta, couscous  - Various types of beans include chickpeas, lentils, fava beans, black beans, white beans   Fruits  Veggies - Large quantities of antioxidant rich fruits & veggies; 6 or more servings  - Vegetables can be eaten raw or lightly drizzled with oil and cooked  - Vegetables common to the traditional Mediterranean Diet include: artichokes, arugula, beets, broccoli,  brussel sprouts, cabbage, carrots, celery, collard greens, cucumbers, eggplant, kale, leeks, lemons, lettuce, mushrooms, okra, onions, peas, peppers, potatoes, pumpkin, radishes, rutabaga, shallots, spinach, sweet potatoes, turnips, zucchini - Fruits common to the Mediterranean Diet include: apples, apricots, avocados, cherries, clementines, dates, figs, grapefruits, grapes, melons, nectarines, oranges, peaches, pears, pomegranates, strawberries, tangerines  Fats - Replace butter and margarine with healthy oils, such as olive oil, canola oil, and tahini  - Limit nuts to no more than a handful a day  - Nuts include walnuts, almonds, pecans, pistachios, pine nuts  - Limit or avoid candied, honey roasted or heavily salted nuts - Olives are central to the [employer] - can be eaten whole or used in a variety of dishes   Meats Protein - Limiting red meat: no more than a few times a month - When eating red meat: choose lean cuts and keep the portion to the size of deck of cards - Eggs: approx. 0 to 4 times a week  - Fish and lean poultry: at least 2 a week  - Healthy protein sources include, chicken, [place], lean beef, lamb - Increase intake of seafood such as tuna, salmon, trout, mackerel, shrimp, scallops - Avoid or limit high fat processed meats such as sausage and bacon  Dairy - Include moderate amounts of low fat dairy products  - Focus on healthy dairy such as fat free yogurt, skim milk, low or reduced fat cheese - Limit dairy products higher in fat such as whole or 2% milk, cheese, ice cream  Alcohol - Moderate amounts of red wine is ok  - No more than 5 oz daily for women (all ages) and men older than age 25  - No more than 10 oz of wine daily for men younger than 2  Other - Limit sweets and other desserts  - Use herbs and spices instead of salt to flavor foods  - Herbs and spices common to the traditional Mediterranean Diet include: basil, bay leaves, chives, cloves, cumin, fennel,  garlic, lavender, marjoram, mint, oregano, parsley, pepper, rosemary, sage, savory, sumac, tarragon, thyme   It's not just a diet, it's a lifestyle:  . The Mediterranean diet includes lifestyle factors typical of those in the region  . Foods, drinks and meals are best eaten with others and savored . Daily physical activity is important for overall good health . This could be strenuous exercise like running and aerobics . This could also be more leisurely activities such as walking, housework, yard-work, or taking the stairs . Moderation is the key; a balanced and healthy diet accommodates most foods and drinks . Consider portion sizes and frequency of consumption of certain foods   Meal Ideas & Options:  . Breakfast:  o Whole wheat toast or whole wheat [name] muffins with peanut butter & hard boiled egg o Steel cut oats topped with apples & cinnamon and skim milk  o Fresh fruit: banana, strawberries, melon, berries, peaches  o Smoothies:  strawberries, bananas, greek yogurt, peanut butter o Low fat greek yogurt with blueberries and granola  o Egg white omelet with spinach and mushrooms o Breakfast couscous: whole wheat couscous, apricots, skim milk, cranberries  . Sandwiches:  o Hummus and grilled vegetables (peppers, zucchini, squash) on whole wheat bread   o Grilled chicken on whole wheat pita with lettuce, tomatoes, cucumbers or tzatziki  o Tuna salad on whole wheat bread: tuna salad made with greek yogurt, olives, red peppers, capers, green onions o Garlic rosemary lamb pita: lamb sauted with garlic, rosemary, salt & pepper; add lettuce, cucumber, greek yogurt to pita - flavor with lemon juice and black pepper  . Seafood:  o Mediterranean grilled salmon, seasoned with garlic, basil, parsley, lemon juice and black pepper o Shrimp, lemon, and spinach whole-grain pasta salad made with low fat greek yogurt  o Seared scallops with lemon orzo  o Seared tuna steaks seasoned salt, pepper,  coriander topped with tomato mixture of olives, tomatoes, olive oil, minced garlic, parsley, green onions and cappers  . Meats:  o Herbed greek chicken salad with kalamata olives, cucumber, feta  o Red bell peppers stuffed with spinach, bulgur, lean ground beef (or lentils) & topped with feta   o Kebabs: skewers of chicken, tomatoes, onions, zucchini, squash  o [place] burgers: made with red onions, mint, dill, lemon juice, feta cheese topped with roasted red peppers . Vegetarian o Cucumber salad: cucumbers, artichoke hearts, celery, red onion, feta cheese, tossed in olive oil & lemon juice  o Hummus and whole grain pita points with a greek salad (lettuce, tomato, feta, olives, cucumbers, red onion) o Lentil soup with celery, carrots made with vegetable broth, garlic, salt and pepper  o Tabouli salad: parsley, bulgur, mint, scallions, cucumbers, tomato, radishes, lemon juice, olive oil, salt and pepper.

## 2017-01-06 NOTE — Progress Notes (Signed)
New patient office visit note:  Impression and Recommendations:    1. Chronic diastolic congestive heart failure ([name])   2. Cigarette smoker   3. H/O cardiac catheterization   4. Chronic obstructive pulmonary disease, unspecified COPD type ([name])   5. Malignant neoplasm of left lung, unspecified part of lung ([name])   6. Hypothyroidism, unspecified type   7. Previous back surgeries   8. Screening for breast cancer   9. Screening for colon cancer   10. Osteoporosis screening   11. Anemia due to antineoplastic chemotherapy   12. Scleroderma ([name])   13. Malignant neoplasm of overlapping sites of left lung ([name])   14. 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 Multiple pulmonary nodules   16. Malignant neoplasm of lung, unspecified laterality, unspecified part of lung ([name])   17. Antineoplastic chemotherapy induced anemia   18. Tobacco abuse counseling      Current Cigarette smoker Told pt to think seriously about quitting smoking!  Told pt it is very important for his/her health and well being.    Smoking cessation instruction/counseling given:  counseled patient on the dangers of tobacco use, advised patient to stop smoking, and reviewed strategies to maximize success  Discussed with patient that there are multiple treatments to aid in quitting smoking, however I explained none will work unless pt really want to quit  Told to call 1-800-QUIT-NOW 438-357-9639) for free smoking cessation counseling and support, or pt can go online to www.heart.org - the American Heart Association website and search "quit smoking ".     Chronic obstructive pulmonary disease (HCC) - Smoking cessation highly encouraged; greater than 3 minutes spent on this -  Patient declines medication; no desire to quit. - Continue medication per Dr. 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 - She understands the need for continual monitoring until discharged  Malignant neoplasm of overlapping sites of left lung ([name]) - Treatment per specialists  Scleroderma [name]) Seen by rheumatology for "incomplete crest syndrome"  Tobacco abuse counseling Greater than 3 minutes  Chronic diastolic congestive heart failure ([name]) Followed by Dr. Ottie Williams of cardiology - Status post MVR February 2018- Dr Roxy Manns- CV Sx   The patient was counseled, risk factors were discussed, anticipatory guidance given.   No orders of the defined types were placed in this encounter.   Discontinued Medications   COCONUT OIL PO    Take 15 mLs by mouth daily. Daily with coffee    DULERA 100-5 MCG/ACT AERO    2 PUFFS INHALED TWICE DAILY. MUST KEEP OFFICE VISIT FOR FURTHER REFILLS.    Modified Medications   No medications on file    Orders Placed This Encounter  Procedures  . MM Digital Screening  . Lipid panel  . CBC with Differential/Platelet  . TSH  . T4, free  . T3, free  . VITAMIN D 25 Hydroxy (Vit-D Deficiency, Fractures)  . Ambulatory referral to Gastroenterology   Gross side effects, risk and benefits, and alternatives of medications discussed with patient.  Patient is aware that all medications have potential side effects and we are unable to predict every side effect or drug-drug interaction that may occur.  Expresses verbal understanding and consents to current therapy plan and treatment regimen.  Return for Fasting bldwrk-near future;then OV w me 1 wk later, also  CPE appt. .  Please see AVS handed out to patient at the end of our visit for further patient instructions/ counseling done pertaining to today's office visit.    Note: This document was prepared using Dragon voice recognition software and may include unintentional dictation  errors.  ----------------------------------------------------------------------------------------------------------------------    Subjective:    Chief complaint:   Chief Complaint  Patient presents with  . Establish Care     HPI: Joy Williams is a pleasant 66 y.o. female who presents to [place] [facility] today to review their medical history with me and establish care.   I asked the patient to review their chronic problem list with me to ensure everything was updated and accurate.    All recent office visits with other providers, any medical records that patient brought in etc  - I reviewed today.     Also asked pt to get me medical records from [facility] providers/ specialists that they had seen within the past 3-5 years- if they are in private practice and/or do not work for a [employer], [facility], [place], [facility] or [employer] owned practice.  Told them to call their specialists to clarify this if they are not sure.   Prior PCP- Joy Mariner, MD- [facility].  ( gives synthroid)  Pt wants a female provider as pcp.  Moved here Nov 15th 2015  2016--> lung CA- L sided-  Dr. Elenor Williams at [facility]. Lung CA Surgeon. Dr Joy Williams- ONC doc at [facility].   Dr  Joy Williams  of ENT- had to fix paralyzed L vocal cord  Dr Joy Williams- At [facility]- replcaed MV in feb 2018 Dr Joy Williams- Cards doc. Dr Joy Williams- Pulm - script symbicort  Problem  Tobacco Use Disorder  Tobacco Abuse Counseling  Chronic Obstructive Pulmonary Disease (Hcc)   Sees Dr Joy Williams.     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  - History of echocardiogram May 2018.  Showed grade 2 diastolic dysfunction but otherwise EF of 65-70% and was normal.   Current Cigarette smoker   Smoked for over 30 yrs or so total--> less than 1ppd. Roughly.  30 pack yr hx.     Still smokes 5-6 cig/day on average      Antineoplastic Chemotherapy Induced Anemia  Multiple Pulmonary Nodules    Followed in Pulmonary clinic/ [facility]/ [place]  - CT/PET eval 2012 [place] Neg with Ca granuloma in LUL  - CT 11/16/2014 >  Left upper and left lower lobe pulmonary nodules, suspicious for synchronous primary bronchogenic carcinomas. The left upper lobe nodule has soft tissue extension to the anterior left pleural space, which could be tumor extension or concurrent scarring. 2. Multiple AP window nodes, upper normal in size. Given location, suspicious. Similarly, left infrahilar nodes for which nodal metastasis cannot be excluded. 3. Consider PET to direct tissue sampling. 4. Emphysema. Question mild interstitial lung disease such as nonspecific interstitial pneumonitis. 5. Other smaller pulmonary nodules which are indeterminate - Rec PET 11/16/2014 >>>       Wt Readings from Last 3 Encounters:  02/20/17 136 lb 3.2 oz (61.8 kg)  02/03/17 133 lb (60.3 kg)  01/29/17 133 lb (60.3 kg)   BP Readings from Last 3  Encounters:  02/20/17 114/72  02/03/17 102/60  01/29/17 (!) 125/58   Pulse Readings from Last 3 Encounters:  02/20/17 71  02/03/17 72  01/29/17 79   BMI Readings from Last 3 Encounters:  02/20/17 22.32 kg/m  02/03/17 21.80 kg/m  01/29/17 20.83 kg/m    Patient Care Team    Relationship Specialty Notifications Start End  Mellody Dance, DO PCP - General Family Medicine  01/06/17   Dorothy Spark, MD Consulting Physician Cardiology  01/06/17   Tanda Rockers, MD Consulting Physician Pulmonary Disease  01/06/17   Hurley Cisco, MD Consulting Physician Rheumatology  01/06/17   Lerry Paterson, MD Referring Physician   01/06/17    Comment: lung ca Surgeon  Lissa Morales, MD Referring Physician Internal Medicine  01/06/17    Comment: Oncologist- Chemo doc    Patient Active Problem List   Diagnosis Date Noted  . 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 Tobacco use disorder 01/22/2017    Priority: High  . Tobacco abuse counseling 01/22/2017    Priority: High  . Chronic obstructive pulmonary disease ([place])     Priority: High  . 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 Malignant neoplasm of left lung ([place]) 12/30/2014    Priority: High  . Malignant neoplasm of overlapping sites of left lung ([place]) 12/06/2014    Priority: High  . Scleroderma ([place]) 10/03/2014    Priority: High  . Age-related osteoporosis without current pathological fracture 02/20/2017    Priority: Medium  . Estrogen deficiency 01/14/2017    Priority: Medium  . H/O cardiac catheterization- in prep for lung ca sx 01/06/2017    Priority: Medium  . Hypothyroidism 01/06/2017    Priority: Medium  . 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 Chronic diastolic congestive heart failure ([place])     Priority: Medium  . Asthmatic bronchitis , chronic ([place]) 10/03/2014    Priority: Medium  . Current Cigarette  smoker 10/03/2014    Priority: Medium  . 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 Neoplastic malignant related fatigue 01/22/2017    Priority: Low  . Vitamin D insufficiency 01/22/2017    Priority: Low  . Previous back surgeries 01/06/2017    Priority: Low  . Antineoplastic chemotherapy induced anemia 05/05/2015    Priority: Low  . Raynaud's disease 01/19/2015    Priority: Low  . Severe mitral regurgitation 11/15/2014    Priority: Low  . Multiple pulmonary nodules 10/03/2014    Priority: Low  . History of epistaxis  03/30/2015  . Lung cancer ([place]) 12/30/2014  . Nonintractable episodic headache 12/06/2014     Past Medical History:  Diagnosis Date  . Allergy   . Asthma    pt denies this, but is on Dulera  . Chronic diastolic congestive heart failure ([place])   . 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 COPD (chronic obstructive pulmonary disease) ([place])   . Family history of adverse reaction to anesthesia    Mother- very sensitive to medication  . Heart murmur    MVP  . Hypothyroidism   . Lung cancer ([place]) 12/30/2014   Synchronous primaries:  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, each treated by wedge resection with post-op adjuvant chemoRx at [facility]  . MVP (mitral valve prolapse)   . PONV (postoperative nausea and vomiting)   . Raynaud's disease    Raynaud's  . Raynaud's syndrome   . 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   31 mm [facility] mitral bovine bioprosthetic tissue valve placed via right mini thoracotomy approach  . Severe mitral regurgitation 11/15/2014  . Shortness of breath dyspnea    with exertion  . STD (sexually transmitted disease)   . Telangiectasia   . Thyroid disease      Past Medical  History:  Diagnosis Date  . Allergy   . Asthma    pt denies this, but is on Dulera  . Chronic diastolic congestive heart failure ([place])   . 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[address]) 06/26/2016  . 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 COPD (chronic obstructive pulmonary disease) ([place])   . Family history of adverse reaction to anesthesia    Mother- very sensitive to medication  . Heart murmur    MVP  . Hypothyroidism   . Lung cancer ([place]) 12/30/2014   Synchronous primaries:  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, each treated by wedge resection with post-op adjuvant chemoRx at [facility]  . MVP (mitral valve prolapse)   . PONV (postoperative nausea and vomiting)   . Raynaud's disease    Raynaud's  . Raynaud's syndrome   . 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 Severe mitral regurgitation 11/15/2014  . Shortness of breath dyspnea    with exertion  . STD (sexually transmitted disease)   . Telangiectasia   . Thyroid disease      Past Surgical History:  Procedure Laterality Date  . BACK SURGERY     x 3  Disectomy  . CARDIAC CATHETERIZATION N/A 03/08/2016   Procedure: Right/Left Heart Cath and Coronary Angiography;  Surgeon: Sherren Mocha, MD;  Location: [place] CV [facility];  Service: Cardiovascular;  Laterality: N/A;  . CLAVICLE SURGERY Left 2013   plate to left collar bone  . LAPAROSCOPY     ? reason-age 62   . LUNG CANCER SURGERY    . MITRAL VALVE REPAIR Right 06/27/2016   Procedure: MINIMALLY INVASIVE MITRAL VALVE REPLACEMENT;  Surgeon: Rexene Alberts, MD;  Location: [place];  Service: Open Heart Surgery;  Laterality: Right;  . TEE WITHOUT CARDIOVERSION N/A 02/22/2016   Procedure: TRANSESOPHAGEAL ECHOCARDIOGRAM (TEE);  Surgeon: Dorothy Spark, MD;  Location: [facility]  ENDOSCOPY;  Service: Cardiovascular;  Laterality: N/A;  . TEE WITHOUT CARDIOVERSION N/A 06/27/2016   Procedure: TRANSESOPHAGEAL ECHOCARDIOGRAM (TEE);  Surgeon: Rexene Alberts, MD;  Location: [place];  Service: Open Heart Surgery;  Laterality: N/A;  . TONSILLECTOMY    . VIDEO ASSISTED THORACOSCOPY (VATS)/WEDGE RESECTION Left 12/30/2014   Bronchoscopy, Mediastinoscopy, Left VATS for Wedge resection LUL x2 adn LLL x1 - Dr. Elenor Williams at [facility]  . VOCAL CORD INJECTION Left 2017   injected with botox  . wrist surgery Left 2015   plate to wrist      Family History  Problem Relation Age of Onset  . Mitral valve prolapse Mother   . Dementia Father   . Prostate cancer Father   . Mitral valve prolapse Brother   . Mitral valve prolapse Sister   . Colon cancer Neg Hx   . Colon polyps Neg Hx   . Rectal cancer Neg Hx   . Esophageal cancer Neg Hx   . Stomach cancer Neg Hx   . Heart attack Neg Hx      History  Drug Use No     History  Alcohol Use  . 1.2 oz/week  . 1 Shots of liquor, 1 Standard drinks or equivalent per week     History  Smoking Status  . Current Some Day Smoker  . Packs/day: 0.25  . Years: 24.00  . Types: E-cigarettes, Cigarettes  Smokeless Tobacco  . Never Used     Outpatient Encounter Prescriptions as of 01/06/2017  Medication Sig  . aspirin EC 81 MG tablet Take 1 tablet (81 mg total) by mouth daily.  . budesonide-formoterol (SYMBICORT) 80-4.5 MCG/ACT inhaler Inhale 2 puffs into the lungs 2 (two) times daily.  Marland Kitchen levothyroxine (SYNTHROID, LEVOTHROID) 175 MCG tablet Take 175 mcg by mouth daily before breakfast.  . Multiple Vitamins-Minerals (MULTIVITAMIN WITH MINERALS) tablet Take 1 tablet by mouth daily.  . [DISCONTINUED] COCONUT OIL PO Take 15 mLs by mouth daily. Daily with coffee   . [DISCONTINUED] DULERA 100-5 MCG/ACT AERO 2 PUFFS INHALED TWICE DAILY. MUST KEEP OFFICE VISIT FOR FURTHER REFILLS.   No facility-administered encounter medications on file as of  01/06/2017.     Allergies: Penicillins   Review of Systems  Constitutional: Negative for chills, diaphoresis, fever, malaise/fatigue and weight loss.  HENT: Negative for congestion, sore throat and tinnitus.   Eyes: Negative for blurred vision, double vision and photophobia.  Respiratory: Negative for cough and wheezing.   Cardiovascular: Negative for chest pain and palpitations.  Gastrointestinal: Negative for blood in stool, diarrhea, nausea and vomiting.  Genitourinary: Negative for dysuria, frequency and urgency.  Musculoskeletal: Negative for joint pain and myalgias.  Skin: Negative for itching and rash.  Neurological: Negative for dizziness, focal weakness, weakness and headaches.  Endo/Heme/Allergies: Negative for environmental allergies and polydipsia. Does not bruise/bleed easily.  Psychiatric/Behavioral: Negative for depression and memory loss. The patient is not nervous/anxious and does not have insomnia.      Objective:   Blood pressure 119/77, pulse 81, height 5\' 7"  (1.702 m), weight 131 lb 1.6 oz (59.5 kg). Body mass index is 20.53 kg/m. General: Well Developed, well nourished, and in no acute distress.  Neuro: Alert and oriented x3, extra-ocular muscles intact, sensation grossly intact.  HEENT:[place]/AT, PERRLA, neck supple, No carotid bruits Skin: no gross rashes  Cardiac: Regular rate and rhythm Respiratory: Essentially clear to auscultation bilaterally. Not using accessory muscles, speaking in full sentences.  Abdominal: not grossly distended Musculoskeletal: Ambulates w/o  diff, FROM * 4 ext.  Vasc: less 2 sec cap RF, warm and pink  Psych:  No HI/SI, judgement and insight good, Euthymic mood. Full Affect.

## 2017-01-14 ENCOUNTER — Other Ambulatory Visit: Payer: Medicare Other

## 2017-01-14 ENCOUNTER — Other Ambulatory Visit: Payer: Self-pay

## 2017-01-14 DIAGNOSIS — T451X5A Adverse effect of antineoplastic and immunosuppressive drugs, initial encounter: Secondary | ICD-10-CM | POA: Diagnosis not present

## 2017-01-14 DIAGNOSIS — E039 Hypothyroidism, unspecified: Secondary | ICD-10-CM

## 2017-01-14 DIAGNOSIS — J449 Chronic obstructive pulmonary disease, unspecified: Secondary | ICD-10-CM

## 2017-01-14 DIAGNOSIS — F17210 Nicotine dependence, cigarettes, uncomplicated: Secondary | ICD-10-CM | POA: Diagnosis not present

## 2017-01-14 DIAGNOSIS — E2839 Other primary ovarian failure: Secondary | ICD-10-CM | POA: Diagnosis not present

## 2017-01-14 DIAGNOSIS — D6481 Anemia due to antineoplastic chemotherapy: Secondary | ICD-10-CM | POA: Diagnosis not present

## 2017-01-14 DIAGNOSIS — Z13820 Encounter for screening for osteoporosis: Secondary | ICD-10-CM

## 2017-01-14 DIAGNOSIS — N951 Menopausal and female climacteric states: Secondary | ICD-10-CM | POA: Diagnosis not present

## 2017-01-14 DIAGNOSIS — C3492 Malignant neoplasm of unspecified part of left bronchus or lung: Secondary | ICD-10-CM | POA: Diagnosis not present

## 2017-01-14 DIAGNOSIS — E559 Vitamin D deficiency, unspecified: Secondary | ICD-10-CM | POA: Diagnosis not present

## 2017-01-14 DIAGNOSIS — I5032 Chronic diastolic (congestive) heart failure: Secondary | ICD-10-CM | POA: Diagnosis not present

## 2017-01-14 DIAGNOSIS — R5383 Other fatigue: Secondary | ICD-10-CM | POA: Diagnosis not present

## 2017-01-15 LAB — CBC WITH DIFFERENTIAL/PLATELET
BASOS ABS: 0 10*3/uL (ref 0.0–0.2)
Basos: 0 %
EOS (ABSOLUTE): 0.1 10*3/uL (ref 0.0–0.4)
Eos: 1 %
Hematocrit: 37.6 % (ref 34.0–46.6)
Hemoglobin: 12.2 g/dL (ref 11.1–15.9)
IMMATURE GRANS (ABS): 0 10*3/uL (ref 0.0–0.1)
IMMATURE GRANULOCYTES: 0 %
LYMPHS: 20 %
Lymphocytes Absolute: 1.4 10*3/uL (ref 0.7–3.1)
MCH: 28.1 pg (ref 26.6–33.0)
MCHC: 32.4 g/dL (ref 31.5–35.7)
MCV: 87 fL (ref 79–97)
Monocytes Absolute: 0.2 10*3/uL (ref 0.1–0.9)
Monocytes: 3 %
NEUTROS ABS: 5.3 10*3/uL (ref 1.4–7.0)
NEUTROS PCT: 76 %
PLATELETS: 182 10*3/uL (ref 150–379)
RBC: 4.34 x10E6/uL (ref 3.77–5.28)
RDW: 14.9 % (ref 12.3–15.4)
WBC: 7 10*3/uL (ref 3.4–10.8)

## 2017-01-15 LAB — LIPID PANEL
Chol/HDL Ratio: 3 ratio (ref 0.0–4.4)
Cholesterol, Total: 175 mg/dL (ref 100–199)
HDL: 59 mg/dL (ref >39)
LDL Calculated: 92 mg/dL (ref 0–99)
TRIGLYCERIDES: 122 mg/dL (ref 0–149)
VLDL CHOLESTEROL CAL: 24 mg/dL (ref 5–40)

## 2017-01-15 LAB — VITAMIN D 25 HYDROXY (VIT D DEFICIENCY, FRACTURES): VIT D 25 HYDROXY: 34.9 ng/mL (ref 30.0–100.0)

## 2017-01-15 LAB — TSH: TSH: 1.04 u[IU]/mL (ref 0.450–4.500)

## 2017-01-15 LAB — T4, FREE: FREE T4: 1.63 ng/dL (ref 0.82–1.77)

## 2017-01-15 LAB — T3, FREE: T3, Free: 2.4 pg/mL (ref 2.0–4.4)

## 2017-01-22 ENCOUNTER — Ambulatory Visit (INDEPENDENT_AMBULATORY_CARE_PROVIDER_SITE_OTHER): Payer: Medicare Other | Admitting: Family Medicine

## 2017-01-22 ENCOUNTER — Encounter: Payer: Self-pay | Admitting: Family Medicine

## 2017-01-22 ENCOUNTER — Other Ambulatory Visit (HOSPITAL_COMMUNITY)
Admission: RE | Admit: 2017-01-22 | Discharge: 2017-01-22 | Disposition: A | Discharge status: Home / Self Care | Payer: Medicare Other | Source: Ambulatory Visit | Attending: Family Medicine | Admitting: Family Medicine

## 2017-01-22 VITALS — BP 110/69 | HR 85 | Ht 67.0 in | Wt 134.1 lb

## 2017-01-22 DIAGNOSIS — Z114 Encounter for screening for human immunodeficiency virus [HIV]: Secondary | ICD-10-CM

## 2017-01-22 DIAGNOSIS — Z113 Encounter for screening for infections with a predominantly sexual mode of transmission: Secondary | ICD-10-CM | POA: Insufficient documentation

## 2017-01-22 DIAGNOSIS — D5912 Cold autoimmune hemolytic anemia: Secondary | ICD-10-CM

## 2017-01-22 DIAGNOSIS — Z124 Encounter for screening for malignant neoplasm of cervix: Secondary | ICD-10-CM

## 2017-01-22 DIAGNOSIS — Z0000 Encounter for general adult medical examination without abnormal findings: Secondary | ICD-10-CM

## 2017-01-22 DIAGNOSIS — C3492 Malignant neoplasm of unspecified part of left bronchus or lung: Secondary | ICD-10-CM | POA: Diagnosis not present

## 2017-01-22 DIAGNOSIS — E039 Hypothyroidism, unspecified: Secondary | ICD-10-CM | POA: Diagnosis not present

## 2017-01-22 DIAGNOSIS — C3482 Malignant neoplasm of overlapping sites of left bronchus and lung: Secondary | ICD-10-CM

## 2017-01-22 DIAGNOSIS — I5032 Chronic diastolic (congestive) heart failure: Secondary | ICD-10-CM

## 2017-01-22 DIAGNOSIS — Z23 Encounter for immunization: Secondary | ICD-10-CM | POA: Diagnosis not present

## 2017-01-22 DIAGNOSIS — F17200 Nicotine dependence, unspecified, uncomplicated: Secondary | ICD-10-CM | POA: Diagnosis not present

## 2017-01-22 DIAGNOSIS — Z716 Tobacco abuse counseling: Secondary | ICD-10-CM | POA: Diagnosis not present

## 2017-01-22 DIAGNOSIS — R530 Neoplastic (malignant) related fatigue: Secondary | ICD-10-CM | POA: Insufficient documentation

## 2017-01-22 DIAGNOSIS — D591 Other autoimmune hemolytic anemias: Secondary | ICD-10-CM

## 2017-01-22 DIAGNOSIS — J449 Chronic obstructive pulmonary disease, unspecified: Secondary | ICD-10-CM | POA: Diagnosis not present

## 2017-01-22 DIAGNOSIS — Z720 Tobacco use: Secondary | ICD-10-CM | POA: Insufficient documentation

## 2017-01-22 DIAGNOSIS — E559 Vitamin D deficiency, unspecified: Secondary | ICD-10-CM | POA: Diagnosis not present

## 2017-01-22 MED ORDER — ZOSTER VAC RECOMB ADJUVANTED 50 MCG/0.5ML IM SUSR: 0.5000 mL | Freq: Once | INTRAMUSCULAR | 0 refills | Status: AC

## 2017-01-22 MED ORDER — VITAMIN D3 125 MCG (5000 UT) PO TABS: ORAL_TABLET | ORAL | 3 refills | Status: DC

## 2017-01-22 NOTE — Patient Instructions (Addendum)
- Joy Williams, please make sure pt gets her script for Shingrix  ( will need 2 doses 2-6 months apart )  to bring to pharmacy and gets her PCV 13 prior to leaving today. Please have her reschedule her f/up OV's for f/up injections now as well so she does not forget.  - Please make sure we obtain blood work for STD panel as we discussed on patient before she leaves office today.   Joy Williams, please make sure we get the results sent to [place] for you upcoming mammogram, bone density test and also you colonoscopy results  - Please make CMA-only visit to get flu shot in Oct 2018.  - take vit D 5,000 IU daily       Joy Williams,   Please think seriously about quitting smoking!  This is very important for your health and well being.   Smoking cessation instruction/counseling given:  counseled patient on the dangers of tobacco use, advised patient to stop smoking, and reviewed strategies to maximize success  Discussed with patient that there are multiple treatments to aid in quitting smoking, however I explained none will work unless pt really wants to quit  Please let us know in the future if you are interested and ready to quit.  You can also call 1-800-QUIT-NOW (970)126-3011) for free smoking cessation counseling and support.     Also, please go online to www.heart.org (the American Heart Association website) and search "quit smoking ".    Or try the centers for disease control website at: https://www.schmidt.com/  There is a lot of great information on these websites for you to look over.      Want to Quit Smoking? FDA-Approved Products Can Help  Quitting smoking can be hard, but it is possible. In fact, every time you put out a cigarette is a new chance to try quitting again, according to the U.S. Food and Drug Administration's newest tobacco education campaign, "Every Try Counts."   If you want to quit-almost 70 percent of adult smokers say they do-you may  want to use a "smoking cessation" product proven to help. Data has shown that using FDA-approved cessation medicine can double your chance of quitting successfully.  Some products contain nicotine as an active ingredient and others do not. These products include over-the-counter (OTC) options like skin patches, lozenges, and gum, as well as prescription medicines.  Smoking cessation products are intended to help you quit smoking. They are regulated through the [facility] for Drug Evaluation and Research, which ensures that the products are safe and effective and that their benefits outweigh any known associated risks.  The Benefits of Quitting Smoking No matter how much you smoke-or for how long-quitting will benefit you.  Not only will you lower your risk of getting various cancers, including lung cancer, you'll also reduce your chances of having heart disease, a stroke, emphysema, and other serious diseases. Quitting also will lower the risk of heart disease and lung cancer in nonsmokers who otherwise would be exposed to your secondhand smoke.  Although there are benefits to quitting at any age, it is important to quit as soon as possible so your body can begin to heal from the damage caused by smoking. For instance, 12 hours after you quit smoking the carbon monoxide level in your blood drops to normal. Carbon monoxide is harmful because it displaces oxygen in the blood and deprives your heart, brain, and other vital organs of oxygen.  What To Know About Smoking Cessation Products Understanding how  smoking cessation products work-and what side effects they may cause-can help you determine which product may be best for you.  If you're considering one of these products, reading labels and talking to your pharmacist and other health care providers are good first steps to take.  You also can check the FDA's website for more information on each product at Drugs_0 , where you can search for each  product by name.  And remember to weigh each product's benefits and risks, among other considerations.  About Nicotine Replacement Therapy (NRT) Nicotine is the substance primarily responsible for causing addiction to tobacco products. Tobacco users who are addicted to nicotine are used to having nicotine in their bodies.  As you try to quit smoking, you may have symptoms of nicotine withdrawal. When you quit, this withdrawal may cause symptoms like cravings, or urges, to smoke; depression; trouble sleeping; irritability; anxiety; and increased appetite.  Nicotine withdrawal can discourage some smokers from continuing with a quit attempt. But the FDA has approved several smoking cessation products designed to help users gradually withdraw from smoking (that is, "wean" themselves from smoking) by using specific amounts of nicotine that decrease over time. This type of product is called a "nicotine replacement therapy" or NRT. It supplies nicotine in controlled amounts while sparing you from other chemicals found in tobacco products.  NRTs are available over the counter and by prescription. You should generally use them only for a short time to help you manage nicotine cravings and withdrawal. However, the FDA recognizes that some people may need to use these products longer to stay smoke-free. Talk to your health care provider to determine the best course of treatment for you.  Over-the-counter NRTs are approved for sale to people age 65 and older. They are available under various brand names and sometimes as generic products. They include:  - Skin patches (also called "transdermal nicotine patches"). These patches are placed on the skin, similar to how you would apply an adhesive bandage. - Chewing gum (also called "nicotine gum"). This gum must be chewed according to the labeled instructions to be effective. - Lozenges (also called "nicotine lozenges"). You use these products by dissolving them in  your mouth. For over-the-counter products, it's important to follow the instructions on the Drug Facts Label (DFL) and to read the enclosed User's Guide for complete directions and other important information. Ask your health care provider if you have questions.  Currently, prescription nicotine replacement therapy is available only under the brand name Nicotrol, and is available both as a nasal spray and an oral inhaler. The products are FDA-approved only for use by adults.  If you are under age 36 and want to quit smoking, talk to a health care professional about whether you should use nicotine replacement therapies.  Important Advice for People Considering Nicotine Replacement Therapy Women who are pregnant or breastfeeding should talk to their health care providers and use nicotine replacement products only if the health care providers approve.  Also talk to your health care provider before using these products if you have:  diabetes, heart disease, asthma, or stomach ulcers; had a recent heart attack; high blood pressure that is not controlled with medicine; a history of irregular heartbeat; or been prescribed medication to help you quit smoking. If you take prescription medication for depression or asthma, tell your health care provider if you are quitting smoking because he or she may need to change your prescription dose.  Stop using a nicotine replacement product and call  your health care professional if you have any of the following symptoms: nausea; dizziness; weakness; vomiting; fast or irregular heartbeat; mouth problems with the lozenge or gum; or redness or swelling of the skin around the patch that does not go away.  About Prescription Cessation Medicines Without Nicotine  The FDA has approved two smoking cessation products that do not contain nicotine. They are Chantix (varenicline tartrate) and Zyban (buproprion hydrochloride). Both are available in tablet form and by  prescription only.  Chantix acts at sites in the brain affected by nicotine by reducing the rewarding effects of nicotine. The precise way that Zyban helps with smoking cessation is unknown.  As with other prescription products, the FDA has evaluated these medicines and found that the benefits outweigh the risks. For users taking these products, risks include changes in behavior, depressed mood, hostility, aggression, and suicidal thoughts or actions.  The most common side effects of Chantix include nausea; constipation; gas; vomiting; and trouble sleeping or vivid, unusual, or strange dreams. Chantix also may change how you react to alcohol, so talk to your health care provider about your drinking habits (if you drink alcohol) and whether these habits need to change. Chantix is not recommended for people under the age of 64.  The most commonly observed side effects consistently associated with the use of Zyban are dry mouth and insomnia.  Because Zyban contains the same active ingredient as the antidepressant Wellbutrin (bupropion), the FDA encourages people who use Zyban-and those who are considering it-to talk to their health care providers about the risks of treatment with antidepressant medicines. Zyban has not been studied in children under the age of 76 and is not approved for use in children and teenagers.  Note: If your health care provider prescribes Chantix or Zyban, please read the product's patient medication guide in its entirety. These guides offer important information on side effects, risks, warnings, product ingredients, and what you should talk about with your health care provider before taking the products.  Finally, if you ever have any side effects related to any smoking cessation products, or have any other problems related to your treatment, the FDA would like to hear from you. Please consider making a voluntary and confidential report to the FDA's MedWatch program.  Updated:  April 29, 2016    Preventive Care for Adults, Female  A healthy lifestyle and preventive care can promote health and wellness. Preventive health guidelines for women include the following key practices.   A routine yearly physical is a good way to check with your health care provider about your health and preventive screening. It is a chance to share any concerns and updates on your health and to receive a thorough exam.   Visit your dentist for a routine exam and preventive care every 6 months. Brush your teeth twice a day and floss once a day. Good oral hygiene prevents tooth decay and gum disease.   The frequency of eye exams is based on your age, health, family medical history, use of contact lenses, and other factors. Follow your health care provider's recommendations for frequency of eye exams.   Eat a healthy diet. Foods like vegetables, fruits, whole grains, low-fat dairy products, and lean protein foods contain the nutrients you need without too many calories. Decrease your intake of foods high in solid fats, added sugars, and salt. Eat the right amount of calories for you.Get information about a proper diet from your health care provider, if necessary.   Regular physical  exercise is one of the most important things you can do for your health. Most adults should get at least 150 minutes of moderate-intensity exercise (any activity that increases your heart rate and causes you to sweat) each week. In addition, most adults need muscle-strengthening exercises on 2 or more days a week.   Maintain a healthy weight. The body mass index (BMI) is a screening tool to identify possible weight problems. It provides an estimate of body fat based on height and weight. Your health care provider can find your BMI, and can help you achieve or maintain a healthy weight.For adults 20 years and older:   - A BMI below 18.5 is considered underweight.   - A BMI of 18.5 to 24.9 is normal.   - A BMI  of 25 to 29.9 is considered overweight.   - A BMI of 30 and above is considered obese.   Maintain normal blood lipids and cholesterol levels by exercising and minimizing your intake of trans and saturated fats.  Eat a balanced diet with plenty of fruit and vegetables. Blood tests for lipids and cholesterol should begin at age 23 and be repeated every 5 years minimum.  If your lipid or cholesterol levels are high, you are over 40, or you are at high risk for heart disease, you may need your cholesterol levels checked more frequently.Ongoing high lipid and cholesterol levels should be treated with medicines if diet and exercise are not working.   If you smoke, find out from your health care provider how to quit. If you do not use tobacco, do not start.   Lung cancer screening is recommended for adults aged 65-80 years who are at high risk for developing lung cancer because of a history of smoking. A yearly low-dose CT scan of the lungs is recommended for people who have at least a 30-pack-year history of smoking and are a current smoker or have quit within the past 15 years. A pack year of smoking is smoking an average of 1 pack of cigarettes a day for 1 year (for example: 1 pack a day for 30 years or 2 packs a day for 15 years). Yearly screening should continue until the smoker has stopped smoking for at least 15 years. Yearly screening should be stopped for people who develop a health problem that would prevent them from having lung cancer treatment.   If you are pregnant, do not drink alcohol. If you are breastfeeding, be very cautious about drinking alcohol. If you are not pregnant and choose to drink alcohol, do not have more than 1 drink per day. One drink is considered to be 12 ounces (355 mL) of beer, 5 ounces (148 mL) of wine, or 1.5 ounces (44 mL) of liquor.   Avoid use of street drugs. Do not share needles with anyone. Ask for help if you need support or instructions about stopping the use  of drugs.   High blood pressure causes heart disease and increases the risk of stroke. Your blood pressure should be checked at least yearly.  Ongoing high blood pressure should be treated with medicines if weight loss and exercise do not work.   If you are 45-42 years old, ask your health care provider if you should take aspirin to prevent strokes.   Diabetes screening involves taking a blood sample to check your fasting blood sugar level. This should be done once every 3 years, after age 21, if you are within normal weight and without risk factors  for diabetes. Testing should be considered at a younger age or be carried out more frequently if you are overweight and have at least 1 risk factor for diabetes.   Breast cancer screening is essential preventive care for women. You should practice "breast self-awareness."  This means understanding the normal appearance and feel of your breasts and may include breast self-examination.  Any changes detected, no matter how small, should be reported to a health care provider.  Women in their 2s and 30s should have a clinical breast exam (CBE) by a health care provider as part of a regular health exam every 1 to 3 years.  After age 18, women should have a CBE every year.  Starting at age 25, women should consider having a mammogram (breast X-ray test) every year.  Women who have a family history of breast cancer should talk to their health care provider about genetic screening.  Women at a high risk of breast cancer should talk to their health care providers about having an MRI and a mammogram every year.   -Breast cancer gene (BRCA)-related cancer risk assessment is recommended for women who have family members with BRCA-related cancers. BRCA-related cancers include breast, ovarian, tubal, and peritoneal cancers. Having family members with these cancers may be associated with an increased risk for harmful changes (mutations) in the breast cancer genes BRCA1  and BRCA2. Results of the assessment will determine the need for genetic counseling and BRCA1 and BRCA2 testing.   The Pap test is a screening test for cervical cancer. A Pap test can show cell changes on the cervix that might become cervical cancer if left untreated. A Pap test is a procedure in which cells are obtained and examined from the lower end of the uterus (cervix).   - Women should have a Pap test starting at age 108.   - Between ages 50 and 56, Pap tests should be repeated every 2 years.   - Beginning at age 84, you should have a Pap test every 3 years as long as the past 3 Pap tests have been normal.   - Some women have medical problems that increase the chance of getting cervical cancer. Talk to your health care provider about these problems. It is especially important to talk to your health care provider if a new problem develops soon after your last Pap test. In these cases, your health care provider may recommend more frequent screening and Pap tests.   - The above recommendations are the same for women who have or have not gotten the vaccine for human papillomavirus (HPV).   - If you had a hysterectomy for a problem that was not cancer or a condition that could lead to cancer, then you no longer need Pap tests. Even if you no longer need a Pap test, a regular exam is a good idea to make sure no other problems are starting.   - If you are between ages 15 and 1 years, and you have had normal Pap tests going back 10 years, you no longer need Pap tests. Even if you no longer need a Pap test, a regular exam is a good idea to make sure no other problems are starting.   - If you have had past treatment for cervical cancer or a condition that could lead to cancer, you need Pap tests and screening for cancer for at least 20 years after your treatment.   - If Pap tests have been discontinued, risk factors (such as  a new sexual partner) need to be reassessed to determine if screening  should be resumed.   - The HPV test is an additional test that may be used for cervical cancer screening. The HPV test looks for the virus that can cause the cell changes on the cervix. The cells collected during the Pap test can be tested for HPV. The HPV test could be used to screen women aged 51 years and older, and should be used in women of any age who have unclear Pap test results. After the age of 17, women should have HPV testing at the same frequency as a Pap test.   Colorectal cancer can be detected and often prevented. Most routine colorectal cancer screening begins at the age of 29 years and continues through age 48 years. However, your health care provider may recommend screening at an earlier age if you have risk factors for colon cancer. On a yearly basis, your health care provider may provide home test kits to check for hidden blood in the stool.  Use of a small camera at the end of a tube, to directly examine the colon (sigmoidoscopy or colonoscopy), can detect the earliest forms of colorectal cancer. Talk to your health care provider about this at age 62, when routine screening begins. Direct exam of the colon should be repeated every 5 -10 years through age 45 years, unless early forms of pre-cancerous polyps or small growths are found.   People who are at an increased risk for hepatitis B should be screened for this virus. You are considered at high risk for hepatitis B if:  -You were born in a country where hepatitis B occurs often. Talk with your health care provider about which countries are considered high risk.  - Your parents were born in a high-risk country and you have not received a shot to protect against hepatitis B (hepatitis B vaccine).  - You have HIV or AIDS.  - You use needles to inject street drugs.  - You live with, or have sex with, someone who has Hepatitis B.  - You get hemodialysis treatment.  - You take certain medicines for conditions like cancer, organ  transplantation, and autoimmune conditions.   Hepatitis C blood testing is recommended for all people born from [birth year] through 1965 and any individual with known risks for hepatitis C.   Practice safe sex. Use condoms and avoid high-risk sexual practices to reduce the spread of sexually transmitted infections (STIs). STIs include gonorrhea, chlamydia, syphilis, trichomonas, herpes, HPV, and human immunodeficiency virus (HIV). Herpes, HIV, and HPV are viral illnesses that have no cure. They can result in disability, cancer, and death. Sexually active women aged 43 years and younger should be checked for chlamydia. Older women with new or multiple partners should also be tested for chlamydia. Testing for other STIs is recommended if you are sexually active and at increased risk.   Osteoporosis is a disease in which the bones lose minerals and strength with aging. This can result in serious bone fractures or breaks. The risk of osteoporosis can be identified using a bone density scan. Women ages 71 years and over and women at risk for fractures or osteoporosis should discuss screening with their health care providers. Ask your health care provider whether you should take a calcium supplement or vitamin D to There are also several preventive steps women can take to avoid osteoporosis and resulting fractures or to keep osteoporosis from worsening. -->Recommendations include:  Eat a balanced diet  high in fruits, vegetables, calcium, and vitamins.  Get enough calcium. The recommended total intake of is 1,200 mg daily; for best absorption, if taking supplements, divide doses into 250-500 mg doses throughout the day. Of the two types of calcium, calcium carbonate is best absorbed when taken with food but calcium citrate can be taken on an empty stomach.  Get enough vitamin D. [facility] and the [employer] recommend at least 1,000 IU per day for women age 52 and over who are at risk of vitamin D  deficiency. Vitamin D deficiency can be caused by inadequate sun exposure (for example, those who live in [place]).  Avoid alcohol and smoking. Heavy alcohol intake (more than 7 drinks per week) increases the risk of falls and hip fracture and women smokers tend to lose bone more rapidly and have lower bone mass than nonsmokers. Stopping smoking is one of the most important changes women can make to improve their health and decrease risk for disease.  Be physically active every day. Weight-bearing exercise (for example, fast walking, hiking, jogging, and weight training) may strengthen bones or slow the rate of bone loss that comes with aging. Balancing and muscle-strengthening exercises can reduce the risk of falling and fracture.  Consider therapeutic medications. Currently, several types of effective drugs are available. Healthcare providers can recommend the type most appropriate for each woman.  Eliminate environmental factors that may contribute to accidents. Falls cause nearly 90% of all osteoporotic fractures, so reducing this risk is an important bone-health strategy. Measures include ample lighting, removing obstructions to walking, using nonskid rugs on floors, and placing mats and/or grab bars in showers.  Be aware of medication side effects. Some common medicines make bones weaker. These include a type of steroid drug called glucocorticoids used for arthritis and asthma, some antiseizure drugs, certain sleeping pills, treatments for endometriosis, and some cancer drugs. An overactive thyroid gland or using too much thyroid hormone for an underactive thyroid can also be a problem. If you are taking these medicines, talk to your doctor about what you can do to help protect your bones.reduce the rate of osteoporosis.    Menopause can be associated with physical symptoms and risks. Hormone replacement therapy is available to decrease symptoms and risks. You should talk to your  health care provider about whether hormone replacement therapy is right for you.   Use sunscreen. Apply sunscreen liberally and repeatedly throughout the day. You should seek shade when your shadow is shorter than you. Protect yourself by wearing long sleeves, pants, a wide-brimmed hat, and sunglasses year round, whenever you are outdoors.   Once a month, do a whole body skin exam, using a mirror to look at the skin on your back. Tell your health care provider of new moles, moles that have irregular borders, moles that are larger than a pencil eraser, or moles that have changed in shape or color.   -Stay current with required vaccines (immunizations).   Influenza vaccine. All adults should be immunized every year.  Tetanus, diphtheria, and acellular pertussis (Td, Tdap) vaccine. Pregnant women should receive 1 dose of Tdap vaccine during each pregnancy. The dose should be obtained regardless of the length of time since the last dose. Immunization is preferred during the 27th 36th week of gestation. An adult who has not previously received Tdap or who does not know her vaccine status should receive 1 dose of Tdap. This initial dose should be followed by tetanus and diphtheria toxoids (Td) booster doses  every 10 years. Adults with an unknown or incomplete history of completing a 3-dose immunization series with Td-containing vaccines should begin or complete a primary immunization series including a Tdap dose. Adults should receive a Td booster every 10 years.  Varicella vaccine. An adult without evidence of immunity to varicella should receive 2 doses or a second dose if she has previously received 1 dose. Pregnant females who do not have evidence of immunity should receive the first dose after pregnancy. This first dose should be obtained before leaving the health care facility. The second dose should be obtained 4 8 weeks after the first dose.  Human papillomavirus (HPV) vaccine. Females aged 46 26  years who have not received the vaccine previously should obtain the 3-dose series. The vaccine is not recommended for use in pregnant females. However, pregnancy testing is not needed before receiving a dose. If a female is found to be pregnant after receiving a dose, no treatment is needed. In that case, the remaining doses should be delayed until after the pregnancy. Immunization is recommended for any person with an immunocompromised condition through the age of 65 years if she did not get any or all doses earlier. During the 3-dose series, the second dose should be obtained 4 8 weeks after the first dose. The third dose should be obtained 24 weeks after the first dose and 16 weeks after the second dose.  Zoster vaccine. One dose is recommended for adults aged 72 years or older unless certain conditions are present.  Measles, mumps, and rubella (MMR) vaccine. Adults born before [birth year] generally are considered immune to measles and mumps. Adults born in [birth year] or later should have 1 or more doses of MMR vaccine unless there is a contraindication to the vaccine or there is laboratory evidence of immunity to each of the three diseases. A routine second dose of MMR vaccine should be obtained at least 28 days after the first dose for students attending postsecondary schools, health care workers, or international travelers. People who received inactivated measles vaccine or an unknown type of measles vaccine during 1963 1967 should receive 2 doses of MMR vaccine. People who received inactivated mumps vaccine or an unknown type of mumps vaccine before 1979 and are at high risk for mumps infection should consider immunization with 2 doses of MMR vaccine. For females of childbearing age, rubella immunity should be determined. If there is no evidence of immunity, females who are not pregnant should be vaccinated. If there is no evidence of immunity, females who are pregnant should delay immunization until after pregnancy.  Unvaccinated health care workers born before [birth year] who lack laboratory evidence of measles, mumps, or rubella immunity or laboratory confirmation of disease should consider measles and mumps immunization with 2 doses of MMR vaccine or rubella immunization with 1 dose of MMR vaccine.  Pneumococcal 13-valent conjugate (PCV13) vaccine. When indicated, a person who is uncertain of her immunization history and has no record of immunization should receive the PCV13 vaccine. An adult aged 75 years or older who has certain medical conditions and has not been previously immunized should receive 1 dose of PCV13 vaccine. This PCV13 should be followed with a dose of pneumococcal polysaccharide (PPSV23) vaccine. The PPSV23 vaccine dose should be obtained at least 8 weeks after the dose of PCV13 vaccine. An adult aged 65 years or older who has certain medical conditions and previously received 1 or more doses of PPSV23 vaccine should receive 1 dose of PCV13. The PCV13 vaccine dose  should be obtained 1 or more years after the last PPSV23 vaccine dose.  Pneumococcal polysaccharide (PPSV23) vaccine. When PCV13 is also indicated, PCV13 should be obtained first. All adults aged 43 years and older should be immunized. An adult younger than age 97 years who has certain medical conditions should be immunized. Any person who resides in a nursing home or long-term care facility should be immunized. An adult smoker should be immunized. People with an immunocompromised condition and certain other conditions should receive both PCV13 and PPSV23 vaccines. People with human immunodeficiency virus (HIV) infection should be immunized as soon as possible after diagnosis. Immunization during chemotherapy or radiation therapy should be avoided. Routine use of PPSV23 vaccine is not recommended for American Indians, [place] Natives, or people younger than 65 years unless there are medical conditions that require PPSV23 vaccine. When indicated,  people who have unknown immunization and have no record of immunization should receive PPSV23 vaccine. One-time revaccination 5 years after the first dose of PPSV23 is recommended for people aged 66 64 years who have chronic kidney failure, nephrotic syndrome, asplenia, or immunocompromised conditions. People who received 1 2 doses of PPSV23 before age 45 years should receive another dose of PPSV23 vaccine at age 12 years or later if at least 5 years have passed since the previous dose. Doses of PPSV23 are not needed for people immunized with PPSV23 at or after age 63 years.  Meningococcal vaccine. Adults with asplenia or persistent complement component deficiencies should receive 2 doses of quadrivalent meningococcal conjugate (MenACWY-D) vaccine. The doses should be obtained at least 2 months apart. Microbiologists working with certain meningococcal bacteria, [employer] recruits, people at risk during an outbreak, and people who travel to or live in countries with a high rate of meningitis should be immunized. A first-year college student up through age 29 years who is living in a residence hall should receive a dose if she did not receive a dose on or after her 16th birthday. Adults who have certain high-risk conditions should receive one or more doses of vaccine.  Hepatitis A vaccine. Adults who wish to be protected from this disease, have certain high-risk conditions, work with hepatitis A-infected animals, work in hepatitis A research labs, or travel to or work in countries with a high rate of hepatitis A should be immunized. Adults who were previously unvaccinated and who anticipate close contact with an international adoptee during the first 60 days after arrival in the [place] from a country with a high rate of hepatitis A should be immunized.  Hepatitis B vaccine.  Adults who wish to be protected from this disease, have certain high-risk conditions, may be exposed to blood or other infectious  body fluids, are household contacts or sex partners of hepatitis B positive people, are clients or workers in certain care facilities, or travel to or work in countries with a high rate of hepatitis B should be immunized.  Haemophilus influenzae type b (Hib) vaccine. A previously unvaccinated person with asplenia or sickle cell disease or having a scheduled splenectomy should receive 1 dose of Hib vaccine. Regardless of previous immunization, a recipient of a hematopoietic stem cell transplant should receive a 3-dose series 6 12 months after her successful transplant. Hib vaccine is not recommended for adults with HIV infection.  Preventive Services / Frequency Ages 80 to 39years  Blood pressure check.** / Every 1 to 2 years.  Lipid and cholesterol check.** / Every 5 years beginning at age 24.  Clinical breast exam.** /  Every 3 years for women in their 45s and 65s.  BRCA-related cancer risk assessment.** / For women who have family members with a BRCA-related cancer (breast, ovarian, tubal, or peritoneal cancers).  Pap test.** / Every 2 years from ages 62 through 44. Every 3 years starting at age 87 through age 36 or 35 with a history of 3 consecutive normal Pap tests.  HPV screening.** / Every 3 years from ages 74 through ages 46 to 44 with a history of 3 consecutive normal Pap tests.  Hepatitis C blood test.** / For any individual with known risks for hepatitis C.  Skin self-exam. / Monthly.  Influenza vaccine. / Every year.  Tetanus, diphtheria, and acellular pertussis (Tdap, Td) vaccine.** / Consult your health care provider. Pregnant women should receive 1 dose of Tdap vaccine during each pregnancy. 1 dose of Td every 10 years.  Varicella vaccine.** / Consult your health care provider. Pregnant females who do not have evidence of immunity should receive the first dose after pregnancy.  HPV vaccine. / 3 doses over 6 months, if 38 and younger. The vaccine is not recommended for use in  pregnant females. However, pregnancy testing is not needed before receiving a dose.  Measles, mumps, rubella (MMR) vaccine.** / You need at least 1 dose of MMR if you were born in 1957 or later. You may also need a 2nd dose. For females of childbearing age, rubella immunity should be determined. If there is no evidence of immunity, females who are not pregnant should be vaccinated. If there is no evidence of immunity, females who are pregnant should delay immunization until after pregnancy.  Pneumococcal 13-valent conjugate (PCV13) vaccine.** / Consult your health care provider.  Pneumococcal polysaccharide (PPSV23) vaccine.** / 1 to 2 doses if you smoke cigarettes or if you have certain conditions.  Meningococcal vaccine.** / 1 dose if you are age 33 to 52 years and a Market researcher living in a residence hall, or have one of several medical conditions, you need to get vaccinated against meningococcal disease. You may also need additional booster doses.  Hepatitis A vaccine.** / Consult your health care provider.  Hepatitis B vaccine.** / Consult your health care provider.  Haemophilus influenzae type b (Hib) vaccine.** / Consult your health care provider.  Ages 57 to 64years  Blood pressure check.** / Every 1 to 2 years.  Lipid and cholesterol check.** / Every 5 years beginning at age 63 years.  Lung cancer screening. / Every year if you are aged 43 80 years and have a 30-pack-year history of smoking and currently smoke or have quit within the past 15 years. Yearly screening is stopped once you have quit smoking for at least 15 years or develop a health problem that would prevent you from having lung cancer treatment.  Clinical breast exam.** / Every year after age 73 years.  BRCA-related cancer risk assessment.** / For women who have family members with a BRCA-related cancer (breast, ovarian, tubal, or peritoneal cancers).  Mammogram.** / Every year beginning at age 29  years and continuing for as long as you are in good health. Consult with your health care provider.  Pap test.** / Every 3 years starting at age 59 years through age 53 or 18 years with a history of 3 consecutive normal Pap tests.  HPV screening.** / Every 3 years from ages 46 years through ages 29 to 55 years with a history of 3 consecutive normal Pap tests.  Fecal occult blood test (FOBT)  of stool. / Every year beginning at age 98 years and continuing until age 71 years. You may not need to do this test if you get a colonoscopy every 10 years.  Flexible sigmoidoscopy or colonoscopy.** / Every 5 years for a flexible sigmoidoscopy or every 10 years for a colonoscopy beginning at age 14 years and continuing until age 17 years.  Hepatitis C blood test.** / For all people born from [birth year] through 1965 and any individual with known risks for hepatitis C.  Skin self-exam. / Monthly.  Influenza vaccine. / Every year.  Tetanus, diphtheria, and acellular pertussis (Tdap/Td) vaccine.** / Consult your health care provider. Pregnant women should receive 1 dose of Tdap vaccine during each pregnancy. 1 dose of Td every 10 years.  Varicella vaccine.** / Consult your health care provider. Pregnant females who do not have evidence of immunity should receive the first dose after pregnancy.  Zoster vaccine.** / 1 dose for adults aged 32 years or older.  Measles, mumps, rubella (MMR) vaccine.** / You need at least 1 dose of MMR if you were born in 1957 or later. You may also need a 2nd dose. For females of childbearing age, rubella immunity should be determined. If there is no evidence of immunity, females who are not pregnant should be vaccinated. If there is no evidence of immunity, females who are pregnant should delay immunization until after pregnancy.  Pneumococcal 13-valent conjugate (PCV13) vaccine.** / Consult your health care provider.  Pneumococcal polysaccharide (PPSV23) vaccine.** / 1 to 2 doses if  you smoke cigarettes or if you have certain conditions.  Meningococcal vaccine.** / Consult your health care provider.  Hepatitis A vaccine.** / Consult your health care provider.  Hepatitis B vaccine.** / Consult your health care provider.  Haemophilus influenzae type b (Hib) vaccine.** / Consult your health care provider.  Ages 59 years and over  Blood pressure check.** / Every 1 to 2 years.  Lipid and cholesterol check.** / Every 5 years beginning at age 43 years.  Lung cancer screening. / Every year if you are aged 13 80 years and have a 30-pack-year history of smoking and currently smoke or have quit within the past 15 years. Yearly screening is stopped once you have quit smoking for at least 15 years or develop a health problem that would prevent you from having lung cancer treatment.  Clinical breast exam.** / Every year after age 68 years.  BRCA-related cancer risk assessment.** / For women who have family members with a BRCA-related cancer (breast, ovarian, tubal, or peritoneal cancers).  Mammogram.** / Every year beginning at age 62 years and continuing for as long as you are in good health. Consult with your health care provider.  Pap test.** / Every 3 years starting at age 29 years through age 79 or 8 years with 3 consecutive normal Pap tests. Testing can be stopped between 65 and 70 years with 3 consecutive normal Pap tests and no abnormal Pap or HPV tests in the past 10 years.  HPV screening.** / Every 3 years from ages 60 years through ages 61 or 71 years with a history of 3 consecutive normal Pap tests. Testing can be stopped between 65 and 70 years with 3 consecutive normal Pap tests and no abnormal Pap or HPV tests in the past 10 years.  Fecal occult blood test (FOBT) of stool. / Every year beginning at age 23 years and continuing until age 44 years. You may not need to do this test if  you get a colonoscopy every 10 years.  Flexible sigmoidoscopy or colonoscopy.** /  Every 5 years for a flexible sigmoidoscopy or every 10 years for a colonoscopy beginning at age 46 years and continuing until age 41 years.  Hepatitis C blood test.** / For all people born from [birth year] through 1965 and any individual with known risks for hepatitis C.  Osteoporosis screening.** / A one-time screening for women ages 7 years and over and women at risk for fractures or osteoporosis.  Skin self-exam. / Monthly.  Influenza vaccine. / Every year.  Tetanus, diphtheria, and acellular pertussis (Tdap/Td) vaccine.** / 1 dose of Td every 10 years.  Varicella vaccine.** / Consult your health care provider.  Zoster vaccine.** / 1 dose for adults aged 16 years or older.  Pneumococcal 13-valent conjugate (PCV13) vaccine.** / Consult your health care provider.  Pneumococcal polysaccharide (PPSV23) vaccine.** / 1 dose for all adults aged 36 years and older.  Meningococcal vaccine.** / Consult your health care provider.  Hepatitis A vaccine.** / Consult your health care provider.  Hepatitis B vaccine.** / Consult your health care provider.  Haemophilus influenzae type b (Hib) vaccine.** / Consult your health care provider. ** Family history and personal history of risk and conditions may change your health care provider's recommendations. Document Released: 07/02/2001 Document Revised: 02/24/2013  [facility] Patient Information 2014 [place], [place].   EXERCISE AND DIET:  We recommended that you start or continue a regular exercise program for good health. Regular exercise means any activity that makes your heart beat faster and makes you sweat.  We recommend exercising at least 30 minutes per day at least 3 days a week, preferably 5.  We also recommend a diet low in fat and sugar / carbohydrates.  Inactivity, poor dietary choices and obesity can cause diabetes, heart attack, stroke, and kidney damage, among others.     ALCOHOL AND SMOKING:  Women should limit their alcohol intake to no  more than 7 drinks/beers/glasses of wine (combined, not each!) per week. Moderation of alcohol intake to this level decreases your risk of breast cancer and liver damage.  ( And of course, no recreational drugs are part of a healthy lifestyle.)  Also, you should not be smoking at all or even being exposed to second hand smoke. Most people know smoking can cause cancer, and various heart and lung diseases, but did you know it also contributes to weakening of your bones?  Aging of your skin?  Yellowing of your teeth and nails?   CALCIUM AND VITAMIN D:  Adequate intake of calcium and Vitamin D are recommended.  The recommendations for exact amounts of these supplements seem to change often, but generally speaking 600 mg of calcium (either carbonate or citrate) and 800 units of Vitamin D per day seems prudent. Certain women may benefit from higher intake of Vitamin D.  If you are among these women, your doctor will have told you during your visit.     PAP SMEARS:  Pap smears, to check for cervical cancer or precancers,  have traditionally been done yearly, although recent scientific advances have shown that most women can have pap smears less often.  However, every woman still should have a physical exam from her gynecologist or primary care physician every year. It will include a breast check, inspection of the vulva and vagina to check for abnormal growths or skin changes, a visual exam of the cervix, and then an exam to evaluate the size and shape of the uterus  and ovaries.  And after 66 years of age, a rectal exam is indicated to check for rectal cancers. We will also provide age appropriate advice regarding health maintenance, like when you should have certain vaccines, screening for sexually transmitted diseases, bone density testing, colonoscopy, mammograms, etc.    MAMMOGRAMS:  All women over 54 years old should have a yearly mammogram. Many facilities now offer a "3D" mammogram, which may cost  around $50 extra out of pocket. If possible,  we recommend you accept the option to have the 3D mammogram performed.  It both reduces the number of women who will be called back for extra views which then turn out to be normal, and it is better than the routine mammogram at detecting truly abnormal areas.     COLONOSCOPY:  Colonoscopy to screen for colon cancer is recommended for all women at age 2.  We know, you hate the idea of the prep.  We agree, BUT, having colon cancer and not knowing it is worse!!  Colon cancer so often starts as a polyp that can be seen and removed at colonscopy, which can quite literally save your life!  And if your first colonoscopy is normal and you have no family history of colon cancer, most women don't have to have it again for 10 years.  Once every ten years, you can do something that may end up saving your life, right?  We will be happy to help you get it scheduled when you are ready.  Be sure to check your insurance coverage so you understand how much it will cost.  It may be covered as a preventative service at no cost, but you should check your particular policy.
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
## 2017-01-24 DIAGNOSIS — A539 Syphilis, unspecified: Secondary | ICD-10-CM | POA: Insufficient documentation

## 2017-01-24 DIAGNOSIS — B009 Herpesviral infection, unspecified: Secondary | ICD-10-CM | POA: Insufficient documentation

## 2017-01-24 LAB — HSV TYPE I/II IGG, IGMW/ REFLEX
HSV 1 Glycoprotein G Ab, IgG: 2.48 index — ABNORMAL HIGH (ref 0.00–0.90)
HSV 2 IgG, Type Spec: >23.6 index — ABNORMAL HIGH (ref 0.00–0.90)

## 2017-01-24 LAB — HIV ANTIBODY (ROUTINE TESTING W REFLEX): HIV Screen 4th Generation wRfx: NONREACTIVE

## 2017-01-24 LAB — HEPATITIS C ANTIBODY: Hep C Virus Ab: <0.1 s/co ratio (ref 0.0–0.9)

## 2017-01-24 LAB — RPR: RPR: REACTIVE — AB

## 2017-01-24 LAB — RPR, QUANT+TP ABS (REFLEX)
Rapid Plasma Reagin, Quant: 1:8 {titer} — ABNORMAL HIGH
TREPONEMA PALLIDUM AB: NEGATIVE

## 2017-01-27 ENCOUNTER — Telehealth: Payer: Self-pay

## 2017-01-27 ENCOUNTER — Ambulatory Visit: Payer: Medicare Other

## 2017-01-27 ENCOUNTER — Other Ambulatory Visit: Payer: Self-pay | Admitting: Family Medicine

## 2017-01-27 ENCOUNTER — Other Ambulatory Visit: Payer: Medicare Other

## 2017-01-27 ENCOUNTER — Telehealth: Payer: Self-pay | Admitting: Family Medicine

## 2017-01-27 DIAGNOSIS — A530 Latent syphilis, unspecified as early or late: Secondary | ICD-10-CM

## 2017-01-27 DIAGNOSIS — R87610 Atypical squamous cells of undetermined significance on cytologic smear of cervix (ASC-US): Secondary | ICD-10-CM

## 2017-01-27 DIAGNOSIS — A510 Primary genital syphilis: Secondary | ICD-10-CM

## 2017-01-27 LAB — CYTOLOGY - PAP
DIAGNOSIS: UNDETERMINED — AB
HPV: NOT DETECTED

## 2017-01-27 NOTE — Telephone Encounter (Signed)
-----   Message from Mellody Dance, DO sent at 01/27/2017 12:51 PM EDT ----- Pt needs to come in to discuss abn labs- there are several and needs to d/c pt in person.   ( Please ask her if she has ever tested positive for T Pallidium, RPR or HSV in past. )  Please document her response.  thanx

## 2017-01-27 NOTE — Telephone Encounter (Signed)
Pt called for lab results from last week--glh

## 2017-01-27 NOTE — Telephone Encounter (Signed)
Pt called requesting lab results.  Please review and advise.  I did note that pap was abnormal.  Thanks!!

## 2017-01-28 ENCOUNTER — Encounter: Payer: Self-pay | Admitting: Family Medicine

## 2017-01-28 NOTE — Telephone Encounter (Signed)
Done.  Dr. Raliegh Scarlet spoke to Dr. Karolee Ohs.  Charyl Bigger, CMA

## 2017-01-28 NOTE — Telephone Encounter (Signed)
Please call and get a ID provioder on line for me to talk to about this pt.  thnx

## 2017-01-29 ENCOUNTER — Encounter: Payer: Self-pay | Admitting: Family Medicine

## 2017-01-29 ENCOUNTER — Telehealth: Payer: Self-pay | Admitting: Family Medicine

## 2017-01-29 ENCOUNTER — Telehealth: Payer: Self-pay | Admitting: Cardiology

## 2017-01-29 ENCOUNTER — Ambulatory Visit (INDEPENDENT_AMBULATORY_CARE_PROVIDER_SITE_OTHER): Payer: Medicare Other | Admitting: Family Medicine

## 2017-01-29 DIAGNOSIS — B009 Herpesviral infection, unspecified: Secondary | ICD-10-CM | POA: Diagnosis not present

## 2017-01-29 DIAGNOSIS — I6523 Occlusion and stenosis of bilateral carotid arteries: Secondary | ICD-10-CM

## 2017-01-29 DIAGNOSIS — Z953 Presence of xenogenic heart valve: Secondary | ICD-10-CM

## 2017-01-29 DIAGNOSIS — R87610 Atypical squamous cells of undetermined significance on cytologic smear of cervix (ASC-US): Secondary | ICD-10-CM

## 2017-01-29 DIAGNOSIS — A539 Syphilis, unspecified: Secondary | ICD-10-CM | POA: Diagnosis not present

## 2017-01-29 NOTE — Telephone Encounter (Signed)
Pt wants to talk to Dr. Meda Coffee about a new diagnosis she received

## 2017-01-29 NOTE — Telephone Encounter (Signed)
Just FYI, I know you wanted to know when appointment was made.

## 2017-01-29 NOTE — Telephone Encounter (Signed)
Pt states she was diagnosed with syphilis.

## 2017-01-29 NOTE — Progress Notes (Signed)
Impression and Recommendations:    1. 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 HSV-1 (herpes simplex virus 1) infection   3. HSV-2 (herpes simplex virus 2) infection   4. ASCUS of cervix with negative high risk HPV    - You can call the cardiologist and make them aware of your new diagnosis of syphilis and see if they feel there is any other cardiac tests they feel necessary at this point.  - Please make sure you let the health Department know to send [place] any medical information as well as your GYN Dr. Sumner Boast.  - We will be passing information along to the health department and they will be calling you for an appointment in the very near future.  If you don't hear from them in the next 24 hours, please give them a call.  - Questions you are going to want to ask them is what kind of follow-up labs am I going to need, what type of routine screening do you recommend for someone who tests positive for syphilis etc.    - Please follow-up with Dr. Sumner Boast of gynecology regarding your abnormal Pap smear.  He was HPV negative but showed some atypical cells.  They have the ability to do colposcopy or follow you up on a regular basis for this.  For any information on any of these diseases is best to go to the [facility].gov website.  - For treatment of herpes you do not need to take any medicines unless the symptoms are bothering you.  Antivirals only decrease the amount of viral shedding and decreased severity and length of symptoms they do not cure it.  There is no cure for that.  The patient was counseled, risk factors were discussed, anticipatory guidance given.   Gross side effects, risk and benefits, and alternatives of medications and treatment plan in general discussed with patient.  Patient is aware that all medications have potential side effects and we are unable to predict every side effect or drug-drug interaction that may occur.   Patient will call with any questions prior to using medication  if they have concerns.  Expresses verbal understanding and consents to current therapy and treatment regimen.  No barriers to understanding were identified.  Red flag symptoms and signs discussed in detail.  Patient expressed understanding regarding what to do in case of emergency\urgent symptoms  Please see AVS handed out to patient at the end of our visit for further patient instructions/ counseling done pertaining to today's office visit.   Return in about 8 weeks (around 03/26/2017) for f/up currnet issues.     Note: This document was prepared using Dragon voice recognition software and may include unintentional dictation errors.  Mellody Dance 12:14 PM --------------------------------------------------------------------------------------------------------------------------------------------------------------------------------------------------------------------------------------------    Subjective:    CC:  Chief Complaint  Patient presents with  . Follow-up    discuss labs    HPI: Joy Williams is a 66 y.o. female who presents to [place] [facility] today for issues as discussed below.  1 sexual relationship one time in 2017- in the past 6 yrs. Prior she was sexually active w/o protection several times with same man.  Ex-husband- had herp[es is all she knew about.   Has h/o atypical pap- colposcopy done about  20  yrs ago--> was told it was benign and to continue surveillance.  PID 27yrs ago.   But she really hasn't had any Pap smear other further care for  at least the past 10 years.   Patient is not sure when her last full STD testing was.  She denies any history of gonorrhea, chlamydia etc.  Genital "red spots"/ occasionally pimple appearance with a little bit of a white head.- comes and goes since 2005.  Never painful, never burning, itched some but o/w no sx.   USed topical cream from an accupunturist and using tea tree oil etc to help- and these  treatments helped with the itch and help the red spots go away.    Wt Readings from Last 3 Encounters:  02/20/17 136 lb 3.2 oz (61.8 kg)  02/03/17 133 lb (60.3 kg)  01/29/17 133 lb (60.3 kg)   BP Readings from Last 3 Encounters:  02/20/17 114/72  02/03/17 102/60  01/29/17 (!) 125/58   Pulse Readings from Last 3 Encounters:  02/20/17 71  02/03/17 72  01/29/17 79   BMI Readings from Last 3 Encounters:  02/20/17 22.32 kg/m  02/03/17 21.80 kg/m  01/29/17 20.83 kg/m     Patient Care Team    Relationship Specialty Notifications Start End  Mellody Dance, DO PCP - General Family Medicine  01/06/17   Dorothy Spark, MD Consulting Physician Cardiology  01/06/17   Tanda Rockers, MD Consulting Physician Pulmonary Disease  01/06/17   Hurley Cisco, MD Consulting Physician Rheumatology  01/06/17   Lerry Paterson, MD Referring Physician   01/06/17    Comment: lung ca Surgeon  Lissa Morales, MD Referring Physician Internal Medicine  01/06/17    Comment: Oncologist- Chemo doc     Patient Active Problem List   Diagnosis Date Noted  . 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 Tobacco use disorder 01/22/2017    Priority: High  . Tobacco abuse counseling 01/22/2017    Priority: High  . Chronic obstructive pulmonary disease ([place])     Priority: High  . 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 Malignant neoplasm of left lung ([place]) 12/30/2014    Priority: High  . Malignant neoplasm of overlapping sites of left lung ([place]) 12/06/2014    Priority: High  . Scleroderma ([place]) 10/03/2014    Priority: High  . Age-related osteoporosis without current pathological fracture 02/20/2017    Priority: Medium  . Estrogen deficiency 01/14/2017    Priority: Medium  . H/O cardiac catheterization- in prep for lung ca sx 01/06/2017    Priority: Medium  . Hypothyroidism 01/06/2017    Priority: Medium  . 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 Chronic diastolic congestive heart failure ([place])     Priority: Medium  . Asthmatic bronchitis , chronic ([place]) 10/03/2014    Priority: Medium  . Current Cigarette smoker 10/03/2014    Priority: Medium  . 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 Neoplastic malignant related fatigue 01/22/2017    Priority: Low  . Vitamin D insufficiency 01/22/2017    Priority: Low  . Previous back surgeries 01/06/2017    Priority: Low  . Antineoplastic chemotherapy induced anemia 05/05/2015    Priority: Low  . Raynaud's disease 01/19/2015    Priority: Low  . Severe mitral regurgitation 11/15/2014    Priority: Low  . Multiple pulmonary nodules 10/03/2014    Priority: Low  . ASCUS of cervix with negative high risk  HPV 01/29/2017  . History of epistaxis 03/30/2015  . Lung cancer ([place]) 12/30/2014  . Nonintractable episodic headache 12/06/2014    Past Medical history, Surgical history, Family history, Social history, Allergies and Medications have been entered into the medical record, reviewed and changed as needed.    Current Meds  Medication Sig  . aspirin EC 81 MG tablet Take 1 tablet (81 mg total) by mouth daily.  . budesonide-formoterol (SYMBICORT) 80-4.5 MCG/ACT inhaler Inhale 2 puffs into the lungs 2 (two) times daily.  . Cholecalciferol (VITAMIN D3) 5000 units TABS 5,000 IU OTC vitamin D3 daily.  Marland Kitchen levothyroxine (SYNTHROID, LEVOTHROID) 175 MCG tablet Take 175 mcg by mouth daily before breakfast.  . Multiple Vitamins-Minerals (MULTIVITAMIN WITH MINERALS) tablet Take 1 tablet by mouth daily.    Allergies:  Allergies  Allergen Reactions  . Penicillins Hives    Unknown, occurred as a child Has patient had a PCN reaction causing immediate rash, facial/tongue/throat swelling, SOB or lightheadedness with hypotension: Yes Has  patient had a PCN reaction causing severe rash involving mucus membranes or skin necrosis: No Has patient had a PCN reaction that required hospitalization  Has patient had a PCN reaction occurring within the last 10 years: No If all of the above answers are "NO", then may proceed with Cephalosporin use. No     Review of Systems: General:   Denies fever, chills, unexplained weight loss.  Optho/Auditory:   Denies visual changes, blurred vision/LOV Respiratory:   Denies wheeze, DOE more than baseline levels.  Cardiovascular:   Denies chest pain, palpitations, new onset peripheral edema  Gastrointestinal:   Denies nausea, vomiting, diarrhea, abd pain.  Genitourinary: Denies dysuria, freq/ urgency, flank pain or discharge from genitals.  Endocrine:     Denies hot or cold intolerance, polyuria, polydipsia. Musculoskeletal:   Denies unexplained myalgias, joint swelling, unexplained arthralgias, gait problems.  Skin:  Denies new onset rash, suspicious lesions Neurological:     Denies dizziness, unexplained weakness, numbness  Psychiatric/Behavioral:   Denies mood changes, suicidal or homicidal ideations, hallucinations    Objective:   Blood pressure (!) 125/58, pulse 79, height 5\' 7"  (1.702 m), weight 133 lb (60.3 kg). Body mass index is 20.83 kg/m. General:  Well Developed, well nourished, appropriate for stated age.  Neuro:  Alert and oriented,  extra-ocular muscles intact  HEENT:  Normocephalic, atraumatic, neck supple, no carotid bruits appreciated  Skin:  no gross rash, warm, pink. Cardiac:  RRR, S1 S2 Respiratory:  ECTA B/L and A/P, Not using accessory muscles, speaking in full sentences- unlabored. Vascular:  Ext warm, no cyanosis apprec.; cap RF less 2 sec. Psych:  No HI/SI, judgement and insight good, Euthymic mood. Full Affect.  Recent Results (from the past 2160 hour(s))  Lipid panel     Status: None   Collection Time: 01/14/17  8:49 AM  Result Value Ref Range    Cholesterol, Total 175 100 - 199 mg/dL   Triglycerides 122 0 - 149 mg/dL   HDL 59 >39 mg/dL   VLDL Cholesterol Cal 24 5 - 40 mg/dL   LDL Calculated 92 0 - 99 mg/dL   Chol/HDL Ratio 3.0 0.0 - 4.4 ratio    Comment:                                   T. Chol/HDL Ratio  Men  Women                               1/2 Avg.Risk  3.4    3.3                                   Avg.Risk  5.0    4.4                                2X Avg.Risk  9.6    7.1                                3X Avg.Risk 23.4   11.0   CBC with Differential/Platelet     Status: None   Collection Time: 01/14/17  8:49 AM  Result Value Ref Range   WBC 7.0 3.4 - 10.8 x10E3/uL   RBC 4.34 3.77 - 5.28 x10E6/uL   Hemoglobin 12.2 11.1 - 15.9 g/dL   Hematocrit 37.6 34.0 - 46.6 %   MCV 87 79 - 97 fL   MCH 28.1 26.6 - 33.0 pg   MCHC 32.4 31.5 - 35.7 g/dL   RDW 14.9 12.3 - 15.4 %   Platelets 182 150 - 379 x10E3/uL   Neutrophils 76 Not Estab. %   Lymphs 20 Not Estab. %   Monocytes 3 Not Estab. %   Eos 1 Not Estab. %   Basos 0 Not Estab. %   Neutrophils Absolute 5.3 1.4 - 7.0 x10E3/uL   Lymphocytes Absolute 1.4 0.7 - 3.1 x10E3/uL   Monocytes Absolute 0.2 0.1 - 0.9 x10E3/uL   EOS (ABSOLUTE) 0.1 0.0 - 0.4 x10E3/uL   Basophils Absolute 0.0 0.0 - 0.2 x10E3/uL   Immature Granulocytes 0 Not Estab. %   Immature Grans (Abs) 0.0 0.0 - 0.1 x10E3/uL  TSH     Status: None   Collection Time: 01/14/17  8:49 AM  Result Value Ref Range   TSH 1.040 0.450 - 4.500 uIU/mL  T4, free     Status: None   Collection Time: 01/14/17  8:49 AM  Result Value Ref Range   Free T4 1.63 0.82 - 1.77 ng/dL  T3, free     Status: None   Collection Time: 01/14/17  8:49 AM  Result Value Ref Range   T3, Free 2.4 2.0 - 4.4 pg/mL  VITAMIN D 25 Hydroxy (Vit-D Deficiency, Fractures)     Status: None   Collection Time: 01/14/17  8:49 AM  Result Value Ref Range   Vit D, 25-Hydroxy 34.9 30.0 - 100.0 ng/mL    Comment: Vitamin  D deficiency has been defined by the Institute of Medicine and an Endocrine Society practice guideline as a level of serum 25-OH vitamin D less than 20 ng/mL (1,2). The Endocrine Society went on to further define vitamin D insufficiency as a level between 21 and 29 ng/mL (2). 1. IOM (Institute of Medicine). 2010. Dietary reference    intakes for calcium and D. [place]: The    [employer]. 2. Holick MF, Binkley Pella, Bischoff-Ferrari HA, et al.    Evaluation, treatment, and prevention of vitamin D    deficiency: an Endocrine Society clinical practice    guideline. JCEM. 2011 Jul; 96(7):1911-30.   Cytology -  PAP     Status: Abnormal   Collection Time: 01/22/17 12:00 AM  Result Value Ref Range   Adequacy (A)     Satisfactory for evaluation  endocervical/transformation zone component PRESENT.   Diagnosis (A)     ATYPICAL SQUAMOUS CELLS OF UNDETERMINED SIGNIFICANCE (ASC-US).   HPV NOT DETECTED     Comment: Normal Reference Range - NOT Detected   Material Submitted CervicoVaginal Pap [ThinPrep Imaged] (A)    CYTOLOGY - PAP PAP RESULT   HIV antibody     Status: None   Collection Time: 01/22/17  3:07 PM  Result Value Ref Range   HIV Screen 4th Generation wRfx Non Reactive Non Reactive  Hepatitis C Antibody     Status: None   Collection Time: 01/22/17  3:07 PM  Result Value Ref Range   Hep C Virus Ab <0.1 0.0 - 0.9 s/co ratio    Comment:                                   Negative:     < 0.8                              Indeterminate: 0.8 - 0.9                                   Positive:     > 0.9  The CDC recommends that a positive HCV antibody result  be followed up with a HCV Nucleic Acid Amplification  test ([id number]).   RPR     Status: Abnormal   Collection Time: 01/22/17  3:07 PM  Result Value Ref Range   RPR Ser Ql Reactive (A) Non Reactive  HSV Type I/II IgG, IgMw/ reflex     Status: Abnormal   Collection Time: 01/22/17  3:07 PM  Result Value Ref Range   HSV  1 Glycoprotein G Ab, IgG 2.48 (H) 0.00 - 0.90 index    Comment:                                  Negative        <0.91                                  Equivocal 0.91 - 1.09                                  Positive        >1.09  Note: Negative indicates no antibodies detected to  HSV-1. Equivocal may suggest early infection.  If  clinically appropriate, retest at later date. Positive  indicates antibodies detected to HSV-1.    HSV 2 IgG, Type Spec >23.60 (H) 0.00 - 0.90 index    Comment:                                  Negative        <0.91  Equivocal 0.91 - 1.09                                  Positive        >1.09  Note: Negative indicates no antibodies detected to  HSV-2. Equivocal may suggest early infection.  If  clinically appropriate, retest at later date. Positive  indicates antibodies detected to HSV-2.    HSV 1 IgM <1:10 <1:10 titer   HSV 2 IgM <1:10 <1:10 titer    Comment: HSV 1 and HSV 2 share many cross-reacting antigens. Elevated titers to both HSV 1 and HSV 2 may represent crossreactive HSV antibodies rather than exposure to both HSV 1 and HSV 2. Results for this test are for research purposes only by the assay's manufacturer. The performance characteristics of this product have not been established.  Results should not be used as a diagnostic procedure without confirmation of the diagnosis by another medically established diagnostic product or procedure.   RPR, quant & T.pallidum antibodies     Status: Abnormal   Collection Time: 01/22/17  3:07 PM  Result Value Ref Range   Rapid Plasma Reagin, Quant 1:8 (H) NonRea<1:1   T Pallidum Abs Negative Negative  GC/Chlamydia Probe Amp     Status: None   Collection Time: 02/03/17 12:12 PM  Result Value Ref Range   Chlamydia trachomatis, NAA Negative Negative   Neisseria gonorrhoeae by PCR Negative Negative

## 2017-01-29 NOTE — Patient Instructions (Addendum)
- You can call the cardiologist and make them aware of your new diagnosis of syphilis and see if they feel there is any other cardiac tests they feel necessary at this point.  - Please make sure you let the health Department no to send [place] any medical information as well as your GYN Dr. Sumner Boast.  - We will be passing information along to the health department and they will be calling you for an appointment in the very near future.  If you don't hear from them in the next 24 hours, please give them a call.  - Questions you are going to want to ask them is what kind of follow-up labs am I going to need, what type of routine screening do you recommend for someone who tests positive for syphilis etc.    - Please follow-up with Dr. Sumner Boast of gynecology regarding your abnormal Pap smear.  He was HPV negative but showed some atypical cells.  They have the ability to do colposcopy or follow you up on a regular basis for this.  For any information on any of these diseases is best to go to the [facility].gov website.  - For treatment of herpes you do not need to take any medicines unless the symptoms are bothering you.  Antivirals only decrease the amount of viral shedding and decreased severity and length of symptoms they do not cure it.  There is no cure for that.    Genital Herpes Genital herpes is a common sexually transmitted infection (STI) that is caused by a virus. The virus spreads from person to person through sexual contact. Infection can cause itching, blisters, and sores around the genitals or rectum. Symptoms may last several days and then go away This is called an outbreak. However, the virus remains in your body, so you may have more outbreaks in the future. The time between outbreaks varies and can be months or years. Genital herpes affects men and women. It is particularly concerning for pregnant women because the virus can be passed to the baby during delivery and can cause serious  problems. Genital herpes is also a concern for people who have a weak disease-fighting (immune) system. What are the causes? This condition is caused by the herpes simplex virus (HSV) type 1 or type 2. The virus may spread through:  Sexual contact with an infected person, including vaginal, anal, and oral sex.  Contact with fluid from a herpes sore.  The skin. This means that you can get herpes from an infected partner even if he or she does not have a visible sore or does not know that he or she is infected.  What increases the risk? You are more likely to develop this condition if:  You have sex with many partners.  You do not use latex condoms during sex.  What are the signs or symptoms? Most people do not have symptoms (asymptomatic) or have mild symptoms that may be mistaken for other skin problems. Symptoms may include:  Small red bumps near the genitals, rectum, or mouth. These bumps turn into blisters and then turn into sores.  Flu-like symptoms, including: ? Fever. ? Body aches. ? Swollen lymph nodes. ? Headache.  Painful urination.  Pain and itching in the genital area or rectal area.  Vaginal discharge.  Tingling or shooting pain in the legs and buttocks.  Generally, symptoms are more severe and last longer during the first (primary) outbreak. Flu-like symptoms are also more common during the  primary outbreak. How is this diagnosed? Genital herpes may be diagnosed based on:  A physical exam.  Your medical history.  Blood tests.  A test of a fluid sample (culture) from an open sore.  How is this treated? There is no cure for this condition, but treatment with antiviral medicines that are taken by mouth (orally) can do the following:  Speed up healing and relieve symptoms.  Help to reduce the spread of the virus to sexual partners.  Limit the chance of future outbreaks, or make future outbreaks shorter.  Lessen symptoms of future outbreaks.  Your  health care provider may also recommend pain relief medicines, such as aspirin or ibuprofen. Follow these instructions at home: Sexual activity  Do not have sexual contact during active outbreaks.  Practice safe sex. Latex condoms and female condoms may help prevent the spread of the herpes virus. General instructions  Keep the affected areas dry and clean.  Take over-the-counter and prescription medicines only as told by your health care provider.  Avoid rubbing or touching blisters and sores. If you do touch blisters or sores: ? Wash your hands thoroughly with soap and water. ? Do not touch your eyes afterward.  To help relieve pain or itching, you may take the following actions as directed by your health care provider: ? Apply a cold, wet cloth (cold compress) to affected areas 4-6 times a day. ? Apply a substance that protects your skin and reduces bleeding (astringent). ? Apply a gel that helps relieve pain around sores (lidocaine gel). ? Take a warm, shallow bath that cleans the genital area (sitz bath).  Keep all follow-up visits as told by your health care provider. This is important. How is this prevented?  Use condoms. Although anyone can get genital herpes during sexual contact, even with the use of a condom, a condom can provide some protection.  Avoid having multiple sexual partners.  Talk with your sexual partner about any symptoms either of you may have. Also, talk with your partner about any history of STIs.  Get tested for STIs before you have sex. Ask your partner to do the same.  Do not have sexual contact if you have symptoms of genital herpes. Contact a health care provider if:  Your symptoms are not improving with medicine.  Your symptoms return.  You have new symptoms.  You have a fever.  You have abdominal pain.  You have redness, swelling, or pain in your eye.  You notice new sores on other parts of your body.  You are a woman and experience  bleeding between menstrual periods.  You have had herpes and you become pregnant or plan to become pregnant. Summary  Genital herpes is a common sexually transmitted infection (STI) that is caused by the herpes simplex virus (HSV) type 1 or type 2.  These viruses are most often spread through sexual contact with an infected person.  You are more likely to develop this condition if you have sex with many partners or you have unprotected sex.  Most people do not have symptoms (asymptomatic) or have mild symptoms that may be mistaken for other skin problems. Symptoms occur as outbreaks that may happen months or years apart.  There is no cure for this condition, but treatment with oral antiviral medicines can reduce symptoms, reduce the chance of spreading the virus to a partner, prevent future outbreaks, or shorten future outbreaks. This information is not intended to replace advice given to you by your health  care provider. Make sure you discuss any questions you have with your health care provider. Document Released: 05/03/2000 Document Revised: 04/05/2016 Document Reviewed: 04/05/2016 Elsevier Interactive Patient Education  2017 [employer].

## 2017-01-29 NOTE — Telephone Encounter (Signed)
Pt is waiting to hear from Health Department about treatment. Pt is concerned because she has had [facility] and is asking if Dr Meda Coffee has any recommendations because of new diagnosis of syphilis.   Pt advised I will forward to Dr Meda Coffee for review.

## 2017-01-29 NOTE — Telephone Encounter (Signed)
Pt reports to provider she has appointment at health dept 9/13 @10 :30 am. --Dion Body

## 2017-01-29 NOTE — Telephone Encounter (Signed)
Great. Thnx

## 2017-01-30 ENCOUNTER — Ambulatory Visit
Admission: RE | Admit: 2017-01-30 | Discharge: 2017-01-30 | Disposition: A | Discharge status: Home / Self Care | Payer: Medicare Other | Source: Ambulatory Visit | Attending: Family Medicine | Admitting: Family Medicine

## 2017-01-30 DIAGNOSIS — M810 Age-related osteoporosis without current pathological fracture: Secondary | ICD-10-CM | POA: Diagnosis not present

## 2017-01-30 DIAGNOSIS — Z780 Asymptomatic menopausal state: Secondary | ICD-10-CM | POA: Diagnosis not present

## 2017-01-30 DIAGNOSIS — Z1231 Encounter for screening mammogram for malignant neoplasm of breast: Secondary | ICD-10-CM | POA: Diagnosis not present

## 2017-01-30 DIAGNOSIS — Z1239 Encounter for other screening for malignant neoplasm of breast: Secondary | ICD-10-CM

## 2017-01-30 DIAGNOSIS — E2839 Other primary ovarian failure: Secondary | ICD-10-CM

## 2017-01-30 NOTE — Telephone Encounter (Signed)
Thank you for letting us know, what are her symptoms? I would repeat an echocardiogram to make sure there is no infection on her valve. KN

## 2017-01-30 NOTE — Telephone Encounter (Signed)
Left a message for the Joy Williams to call back to go over Dr Francesca Oman recommendations, based on the information the Joy Williams provided to our office yesterday.

## 2017-01-30 NOTE — Telephone Encounter (Signed)
Notified the pt that Dr Meda Coffee reviewed her message from yesterday and wanted me to ask the pt if she is having any symptoms, and she recommends that we order an echo to make sure she has no infection in her valve.  Per the pt, she states she is having no symptoms at all.  Pt is interested in getting an echo done, for precaution reasons.  Informed the pt that I will place the order for the echo in the system and have a [facility] scheduler call her back to arrange this appt.  Informed the pt that I will make Dr Meda Coffee aware that she is asymptomatic. Pt verbalized understanding and agrees with this plan.

## 2017-01-31 NOTE — Telephone Encounter (Signed)
Pts echo is scheduled for 9/19 at 0930.  Pt made aware of echo appt date and time by [facility] scheduling.

## 2017-02-03 ENCOUNTER — Encounter: Payer: Self-pay | Admitting: Obstetrics and Gynecology

## 2017-02-03 ENCOUNTER — Ambulatory Visit (INDEPENDENT_AMBULATORY_CARE_PROVIDER_SITE_OTHER): Payer: Medicare Other | Admitting: Obstetrics and Gynecology

## 2017-02-03 VITALS — BP 102/60 | HR 72 | Resp 18 | Ht 65.5 in | Wt 133.0 lb

## 2017-02-03 DIAGNOSIS — R87610 Atypical squamous cells of undetermined significance on cytologic smear of cervix (ASC-US): Secondary | ICD-10-CM

## 2017-02-03 DIAGNOSIS — I6523 Occlusion and stenosis of bilateral carotid arteries: Secondary | ICD-10-CM

## 2017-02-03 DIAGNOSIS — R768 Other specified abnormal immunological findings in serum: Secondary | ICD-10-CM | POA: Diagnosis not present

## 2017-02-03 DIAGNOSIS — Z113 Encounter for screening for infections with a predominantly sexual mode of transmission: Secondary | ICD-10-CM | POA: Diagnosis not present

## 2017-02-03 NOTE — Progress Notes (Signed)
66 y.o. [id number] DivorcedCaucasianF here referred by PCP for consult to discuss STD results and PAP results  Patient was seen at her primary MD on 01/22/17. Labs returned with ASCUS, negative HPV pap, false + syphilis testing (+RPR, 1:8, negative T Pallidium), +HSV 1&2. She was unaware of HSV diagnosis, no outbreaks. Every once in a while she gets some irritation in the panty line, treated with OTC creams. Her Ex-husband had HSV.     No LMP recorded. Patient is postmenopausal.          Sexually active: No.  The current method of family planning is post menopausal status.    Exercising: No.  The patient does not participate in regular exercise at present. Smoker:  yes  Health Maintenance: Pap:  01-22-17 ASCUS NEG HR HPV, this is her first pap for years.  History of abnormal Pap:  Yes ASCUS, she had a colposcopy and everything was normal.  MMG:  01-30-17 WNL  Colonoscopy:  Never BMD:   01-30-17 WNL osteoporotic  TDaP:  09-26-15 Gardasil: N/A   reports that she has been smoking E-cigarettes and Cigarettes.  She has a 6.00 pack-year smoking history. She has never used smokeless tobacco. She reports that she drinks about 1.2 oz of alcohol per week . She reports that she does not use drugs.  Past Medical History:  Diagnosis Date  . Allergy   . Asthma    pt denies this, but is on Dulera  . Chronic diastolic congestive heart failure ([place])   . 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 COPD (chronic obstructive pulmonary disease) ([place])   . Family history of adverse reaction to anesthesia    Mother- very sensitive to medication  . Heart murmur    MVP  . Hypothyroidism   . 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 MVP (mitral valve prolapse)   . PONV  (postoperative nausea and vomiting)   . Raynaud's disease    Raynaud's  . Raynaud's syndrome   . 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 Severe mitral regurgitation 11/15/2014  . Shortness of breath dyspnea    with exertion  . STD (sexually transmitted disease)   . Telangiectasia   . Thyroid disease     Past Surgical History:  Procedure Laterality Date  . BACK SURGERY     x 3  Disectomy  . CARDIAC CATHETERIZATION N/A 03/08/2016   Procedure: Right/Left Heart Cath and Coronary Angiography;  Surgeon: Sherren Mocha, MD;  Location: [place] [facility];  Service: Cardiovascular;  Laterality: N/A;  . CLAVICLE SURGERY Left 2013   plate to left collar bone  . LAPAROSCOPY     ? reason-age 66   . LUNG CANCER SURGERY    . MITRAL VALVE REPAIR Right 06/27/2016   Procedure: MINIMALLY INVASIVE MITRAL VALVE REPLACEMENT;  Surgeon: Rexene Alberts, MD;  Location: [place];  Service: Open Heart Surgery;  Laterality: Right;  . TEE WITHOUT CARDIOVERSION N/A 02/22/2016   Procedure: TRANSESOPHAGEAL ECHOCARDIOGRAM (TEE);  Surgeon: Dorothy Spark, MD;  Location: [facility] ENDOSCOPY;  Service: Cardiovascular;  Laterality: N/A;  . TEE WITHOUT CARDIOVERSION  N/A 06/27/2016   Procedure: TRANSESOPHAGEAL ECHOCARDIOGRAM (TEE);  Surgeon: Rexene Alberts, MD;  Location: [place];  Service: Open Heart Surgery;  Laterality: N/A;  . TONSILLECTOMY    . VIDEO ASSISTED THORACOSCOPY (VATS)/WEDGE RESECTION Left 12/30/2014   Bronchoscopy, Mediastinoscopy, Left VATS for Wedge resection LUL x2 adn LLL x1 - Dr. Elenor Quinones at [facility]  . VOCAL CORD INJECTION Left 2017   injected with botox  . wrist surgery Left 2015   plate to wrist     Current Outpatient Prescriptions  Medication Sig Dispense Refill  . aspirin EC 81 MG tablet Take 1 tablet (81 mg total) by mouth daily.    . budesonide-formoterol  (SYMBICORT) 80-4.5 MCG/ACT inhaler Inhale 2 puffs into the lungs 2 (two) times daily. 1 Inhaler 11  . Cholecalciferol (VITAMIN D3) 5000 units TABS 5,000 IU OTC vitamin D3 daily. 90 tablet 3  . levothyroxine (SYNTHROID, LEVOTHROID) 175 MCG tablet Take 175 mcg by mouth daily before breakfast.    . Multiple Vitamins-Minerals (MULTIVITAMIN WITH MINERALS) tablet Take 1 tablet by mouth daily.     No current facility-administered medications for this visit.     Family History  Problem Relation Age of Onset  . Mitral valve prolapse Mother   . Dementia Father   . Prostate cancer Father   . Mitral valve prolapse Brother   . Mitral valve prolapse Sister   . Colon cancer Neg Hx   . Colon polyps Neg Hx   . Rectal cancer Neg Hx   . Esophageal cancer Neg Hx   . Stomach cancer Neg Hx   . Heart attack Neg Hx     Review of Systems  Constitutional: Negative.   HENT: Negative.   Eyes: Negative.   Respiratory: Negative.   Cardiovascular: Negative.   Gastrointestinal: Negative.   Endocrine: Negative.   Genitourinary: Negative.   Musculoskeletal: Negative.   Skin: Negative.   Allergic/Immunologic: Negative.   Neurological: Negative.   Psychiatric/Behavioral: Negative.     Exam:   BP 102/60 (BP Location: Right Arm, Patient Position: Sitting, Cuff Size: Normal)   Pulse 72   Resp 18   Ht 5' 5.5" (1.664 m)   Wt 133 lb (60.3 kg)   BMI 21.80 kg/m   Weight change: @WEIGHTCHANGE @ Height:   Height: 5' 5.5" (166.4 cm)  Ht Readings from Last 3 Encounters:  02/03/17 5' 5.5" (1.664 m)  01/29/17 5\' 7"  (1.702 m)  01/22/17 5\' 7"  (1.702 m)    General appearance: alert, cooperative and appears stated age  A:  H/O ASCUS, negative hpv pap  Patient reports prior h/o abnormal pap years ago with a negative colposcopy many years ago, she thinks that may have been her last pap.  False + RPR, patient aware she doesn't have syphilis  HSV, recently diagnosed, the patient was unaware. Her ex-husband did have  hsv  P:   Discussed that typically with an ASCUS/-HPV pap she would just get a f/u pap and hpv in 3 years  Given her prior abnormal pap years ago and not having access to any of those records, I've recommended that she have another pap and hpv in one year  Will check GC/Chlamydia testing   She is aware of +HSV 1 and 2, not symptomatic. Has valtrex to take prn    CC: Dr Jerelene Redden Note sent  Spent approximately 10 minutes in counseling

## 2017-02-04 LAB — GC/CHLAMYDIA PROBE AMP
Chlamydia trachomatis, NAA: NEGATIVE
NEISSERIA GONORRHOEAE BY PCR: NEGATIVE

## 2017-02-05 ENCOUNTER — Other Ambulatory Visit (HOSPITAL_COMMUNITY): Payer: Medicare Other

## 2017-02-13 ENCOUNTER — Ambulatory Visit (HOSPITAL_COMMUNITY): Payer: Medicare Other | Attending: Cardiology

## 2017-02-13 ENCOUNTER — Other Ambulatory Visit: Payer: Self-pay

## 2017-02-13 DIAGNOSIS — Z720 Tobacco use: Secondary | ICD-10-CM | POA: Insufficient documentation

## 2017-02-13 DIAGNOSIS — Z48812 Encounter for surgical aftercare following surgery on the circulatory system: Secondary | ICD-10-CM | POA: Diagnosis not present

## 2017-02-13 DIAGNOSIS — I059 Rheumatic mitral valve disease, unspecified: Secondary | ICD-10-CM | POA: Insufficient documentation

## 2017-02-13 DIAGNOSIS — M349 Systemic sclerosis, unspecified: Secondary | ICD-10-CM | POA: Insufficient documentation

## 2017-02-13 DIAGNOSIS — J449 Chronic obstructive pulmonary disease, unspecified: Secondary | ICD-10-CM | POA: Diagnosis not present

## 2017-02-13 DIAGNOSIS — C3490 Malignant neoplasm of unspecified part of unspecified bronchus or lung: Secondary | ICD-10-CM | POA: Insufficient documentation

## 2017-02-13 DIAGNOSIS — I509 Heart failure, unspecified: Secondary | ICD-10-CM | POA: Diagnosis not present

## 2017-02-13 DIAGNOSIS — A539 Syphilis, unspecified: Secondary | ICD-10-CM | POA: Diagnosis not present

## 2017-02-13 DIAGNOSIS — Z953 Presence of xenogenic heart valve: Secondary | ICD-10-CM | POA: Diagnosis not present

## 2017-02-17 ENCOUNTER — Telehealth: Payer: Self-pay

## 2017-02-17 NOTE — Telephone Encounter (Signed)
Discussed with pt mammogram results and need for OV with Dr. Raliegh Scarlet to discuss osteoporosis treatment.  Pt expressed understanding and is agreeable.  Pt transferred to front desk to schedule OV.  Charyl Bigger, CMA

## 2017-02-20 ENCOUNTER — Encounter: Payer: Self-pay | Admitting: Family Medicine

## 2017-02-20 ENCOUNTER — Ambulatory Visit (INDEPENDENT_AMBULATORY_CARE_PROVIDER_SITE_OTHER): Payer: Medicare Other | Admitting: Family Medicine

## 2017-02-20 VITALS — BP 114/72 | HR 71 | Ht 65.5 in | Wt 136.2 lb

## 2017-02-20 DIAGNOSIS — F17200 Nicotine dependence, unspecified, uncomplicated: Secondary | ICD-10-CM | POA: Diagnosis not present

## 2017-02-20 DIAGNOSIS — Z23 Encounter for immunization: Secondary | ICD-10-CM

## 2017-02-20 DIAGNOSIS — I6523 Occlusion and stenosis of bilateral carotid arteries: Secondary | ICD-10-CM

## 2017-02-20 DIAGNOSIS — M810 Age-related osteoporosis without current pathological fracture: Secondary | ICD-10-CM | POA: Diagnosis not present

## 2017-02-20 DIAGNOSIS — Z716 Tobacco abuse counseling: Secondary | ICD-10-CM | POA: Diagnosis not present

## 2017-02-20 MED ORDER — ALENDRONATE SODIUM 70 MG PO TABS: 70.0000 mg | ORAL_TABLET | ORAL | 11 refills | Status: DC

## 2017-02-20 NOTE — Assessment & Plan Note (Signed)
Treatment per specialist

## 2017-02-20 NOTE — Assessment & Plan Note (Addendum)
-   D/c pt txmnt options- even watchful waiting for resolution.   - Explained what virus is, and we cannot "kill it" just suppress it with meds during outbrk -  Valtrex 1 g twice a day for 7-10 days for presumedly new onset if patient is having any skin lesions.  If she is not, we'll give her valtrex 1 gram * 5 d for reoccurences

## 2017-02-20 NOTE — Patient Instructions (Signed)

## 2017-02-20 NOTE — Assessment & Plan Note (Signed)
Told pt to think seriously about quitting smoking!  Told pt it is very important for his/her health and well being.    Smoking cessation instruction/counseling given:  counseled patient on the dangers of tobacco use, advised patient to stop smoking, and reviewed strategies to maximize success  Discussed with patient that there are multiple treatments to aid in quitting smoking, however I explained none will work unless pt really want to quit  Told to call 1-800-QUIT-NOW 281-350-2253) for free smoking cessation counseling and support, or pt can go online to www.heart.org - the American Heart Association website and search "quit smoking ".

## 2017-02-20 NOTE — Assessment & Plan Note (Signed)
Seen by rheumatology for "incomplete crest syndrome"

## 2017-02-20 NOTE — Assessment & Plan Note (Signed)
To OB-GYN; Dr Talbert Nan

## 2017-02-20 NOTE — Assessment & Plan Note (Signed)
Will obtain CBC near future

## 2017-02-20 NOTE — Assessment & Plan Note (Signed)
Followed by Dr. Ottie Glazier of cardiology - Status post MVR February 2018- Dr Roxy Manns- CV Sx

## 2017-02-20 NOTE — Assessment & Plan Note (Signed)
D/c pt txmnt options- even watchful waiting for resolution.  Explained virus, and we cannot "kill it" just suppress it with meds during [employer]

## 2017-02-20 NOTE — Assessment & Plan Note (Addendum)
-   Smoking cessation highly encouraged; greater than 3 minutes spent on this -  Patient declines medication; no desire to quit. - Continue medication per Dr. Melvyn Novas

## 2017-02-20 NOTE — Assessment & Plan Note (Signed)
On 01/28/17:  Called and spoke with Dr. Graylon Good of ID this morning.  We discussed patient's labs and clinical signs and symptoms.  She advised to send patient to the health department for pen G1 0.2 million units each buttocks once weekly for 3 weeks.    - She told me that patient did not need to see infectious disease for this condition.  She did not feel that the genital lesions were likely from the syphilis but rather from the genital herpes, although they were painless and not ulcerative.    - I'm seeing patient tomorrow to discuss these labs in person and we will address her HSV and abnormal Pap smear as well.  -> discussed results with pt as above.  Sent to Health Dept.  Handouts provided after discussion had

## 2017-02-20 NOTE — Assessment & Plan Note (Signed)
-   Treatment per specialists

## 2017-02-20 NOTE — Assessment & Plan Note (Signed)
Greater than 3 minutes

## 2017-02-20 NOTE — Progress Notes (Signed)
Impression and Recommendations:    1. Age-related osteoporosis without current pathological fracture   2. Tobacco use disorder   3. Tobacco abuse counseling   4. Flu vaccine need     1. Flu vaccine need  - Flu vaccine HIGH DOSE PF  2. Age-related osteoporosis without current pathological fracture  - Patient declines hormone replacement therapy especially with her history. - Bisphosphonates discussed with the patient.    Fosamax started.   FRAX SCORE: Patient's 10-year risk of any fracture is 34%.  Patient's 10-year risk of hip fracture is 13%.    - Adequate vitamin D and calcium intake discussed with patient. -  We discussed importance of weightbearing exercises 5 or more days per week for 30 minutes along with 2 days per week of weight training - Smoking cessation discussed along with decreasing caffeine and alcohol intake. - The patient was counseled, risk factors were discussed, anticipatory guidance given. --- Patient was given handouts on osteoporosis as well as Fosamax detailed information about risks and benefits of medication.  She will let us know if she has any questions before using the medicine    New Prescriptions   ALENDRONATE (FOSAMAX) 70 MG TABLET    Take 1 tablet (70 mg total) by mouth every 7 (seven) days. Take with a full glass of water on an empty stomach.    Discontinued Medications   No medications on file    Modified Medications   No medications on file     Meds ordered this encounter  Medications  . alendronate (FOSAMAX) 70 MG tablet    Sig: Take 1 tablet (70 mg total) by mouth every 7 (seven) days. Take with a full glass of water on an empty stomach.    Dispense:  4 tablet    Refill:  11     Orders Placed This Encounter  Procedures  . Flu vaccine HIGH DOSE PF   Gross side effects, risk and benefits, and alternatives of medications and treatment plan in general discussed with patient.  Patient is aware that all medications have  potential side effects and we are unable to predict every side effect or drug-drug interaction that may occur.   Patient will call with any questions prior to using medication if they have concerns.  Expresses verbal understanding and consents to current therapy and treatment regimen.  No barriers to understanding were identified.  Red flag symptoms and signs discussed in detail.  Patient expressed understanding regarding what to do in case of emergency\urgent symptoms  Please see AVS handed out to patient at the end of our visit for further patient instructions/ counseling done pertaining to today's office visit.   Return for Has follow-up appointment already.     Note: This document was prepared using Dragon voice recognition software and may include unintentional dictation errors.  Mellody Dance 12:33 PM --------------------------------------------------------------------------------------------------------------------------------------------------------------------------------------------------------------------------------------------    Subjective:    CC:  Chief Complaint  Patient presents with  . Follow-up    discuss bone desity results    HPI: Joy Williams is a 66 y.o. female who presents to [place] at [facility] today for issues as discussed below.  - Discussed with patient false diagnosis of syphilis.  This was removed from patient's medical record today. - HSV-1 and 2:  She has no complaints of herpes lesions  -  She did follow-up with Dr. Sumner Boast for her ASCUS.   -  Here to discuss Mammogram and DEXA scans-  those results.   *  T score of femoral neck is -3.1.  Patient without family history.  Never had pathological fracture or fall.  Likes to swim and does very little weightbearing exercise.  Is a smoker/ continues to smoke.  Is taking her vitamin D and calcium daily. - Echo was incidentally also done.  Results reviewed with patient which were  normal, and reviewed with the patient by her cardiologist   Wt Readings from Last 3 Encounters:  02/20/17 136 lb 3.2 oz (61.8 kg)  02/03/17 133 lb (60.3 kg)  01/29/17 133 lb (60.3 kg)   BP Readings from Last 3 Encounters:  02/20/17 114/72  02/03/17 102/60  01/29/17 (!) 125/58   Pulse Readings from Last 3 Encounters:  02/20/17 71  02/03/17 72  01/29/17 79   BMI Readings from Last 3 Encounters:  02/20/17 22.32 kg/m  02/03/17 21.80 kg/m  01/29/17 20.83 kg/m     Patient Care Team    Relationship Specialty Notifications Start End  Mellody Dance, DO PCP - General Family Medicine  01/06/17   Dorothy Spark, MD Consulting Physician Cardiology  01/06/17   Tanda Rockers, MD Consulting Physician Pulmonary Disease  01/06/17   Hurley Cisco, MD Consulting Physician Rheumatology  01/06/17   Lerry Paterson, MD Referring Physician   01/06/17    Comment: lung ca Surgeon  Lissa Morales, MD Referring Physician Internal Medicine  01/06/17    Comment: Oncologist- Chemo doc     Patient Active Problem List   Diagnosis Date Noted  . Tobacco use disorder 01/22/2017    Priority: High  . Tobacco abuse counseling 01/22/2017    Priority: High  . Chronic obstructive pulmonary disease ([place])     Priority: High  . 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 Malignant neoplasm of left lung ([place]) 12/30/2014    Priority: High  . Malignant neoplasm of overlapping sites of left lung ([place]) 12/06/2014    Priority: High  . 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 Age-related osteoporosis without current pathological fracture 02/20/2017    Priority: Medium  . Estrogen deficiency 01/14/2017    Priority: Medium  . H/O cardiac catheterization- in prep for lung ca sx 01/06/2017    Priority: Medium  . Hypothyroidism 01/06/2017    Priority: Medium  . 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  Chronic diastolic congestive heart failure ([place])     Priority: Medium  . Asthmatic bronchitis , chronic ([place]) 10/03/2014    Priority: Medium  . Current Cigarette smoker 10/03/2014    Priority: Medium  . 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 Neoplastic malignant related fatigue 01/22/2017    Priority: Low  . Vitamin D insufficiency 01/22/2017    Priority: Low  . Previous back surgeries 01/06/2017    Priority: Low  . Antineoplastic chemotherapy induced anemia 05/05/2015    Priority: Low  . Raynaud's disease 01/19/2015    Priority: Low  . Severe mitral regurgitation 11/15/2014    Priority: Low  . Multiple pulmonary nodules 10/03/2014    Priority: Low  . ASCUS of cervix with negative high risk HPV 01/29/2017  . History of epistaxis 03/30/2015  . Lung cancer ([place]) 12/30/2014  . Nonintractable episodic headache 12/06/2014    Past Medical history, Surgical history,  Family history, Social history, Allergies and Medications have been entered into the medical record, reviewed and changed as needed.    Current Meds  Medication Sig  . Cholecalciferol (VITAMIN D3) 5000 units TABS 5,000 IU OTC vitamin D3 daily.  Marland Kitchen levothyroxine (SYNTHROID, LEVOTHROID) 175 MCG tablet Take 175 mcg by mouth daily before breakfast.  . Multiple Vitamins-Minerals (MULTIVITAMIN WITH MINERALS) tablet Take 1 tablet by mouth daily.    Allergies:  Allergies  Allergen Reactions  . Penicillins Hives    Unknown, occurred as a child Has patient had a PCN reaction causing immediate rash, facial/tongue/throat swelling, SOB or lightheadedness with hypotension: Yes Has patient had a PCN reaction causing severe rash involving mucus membranes or skin necrosis: No Has patient had a PCN reaction that required hospitalization  Has patient had a PCN reaction occurring within the last 10 years: No If all of the above  answers are "NO", then may proceed with Cephalosporin use. No    Review of Systems: General:   No F/C, wt loss Pulm:   No DIB, SOB, pleuritic chest pain Card:  No CP, palpitations Abd:  No n/v/d or pain Ext:  No inc edema from baseline   Objective:   Blood pressure 114/72, pulse 71, height 5' 5.5" (1.664 m), weight 136 lb 3.2 oz (61.8 kg). Body mass index is 22.32 kg/m. General: Well nourished, in no apparent distress. Eyes: PERRLA, EOMs, conjunctiva clr Resp: Respiratory effort- normal, ECTA B/L w/o W/R/R  Cardio: RRR w/o MRGs. Abdomen: no gross distention. Lymphatics:  less 2 sec cap RF M-sk: Full ROM, 5/5 strength, normal gait.  Skin: Warm, dry  Neuro: Alert, Oriented Psych: Normal affect, Insight and Judgment appropriate.

## 2017-02-20 NOTE — Assessment & Plan Note (Signed)
-   Has follow-up office visits, patient very compliant with this. - She understands the need for continual monitoring until discharged

## 2017-03-03 NOTE — Progress Notes (Signed)
Received Epic notification that pt has not read MyChart message regarding mammogram results.  Per imaging report, a letter from their facility was mailed to patient with results.  Therefore, no further action taken.  Charyl Bigger, CMA

## 2017-03-10 ENCOUNTER — Encounter: Payer: Self-pay | Admitting: Family Medicine

## 2017-03-26 ENCOUNTER — Ambulatory Visit: Payer: Medicare Other | Admitting: Family Medicine

## 2017-04-23 ENCOUNTER — Ambulatory Visit: Payer: Medicare Other

## 2017-04-24 ENCOUNTER — Ambulatory Visit: Payer: Medicare Other | Admitting: Family Medicine

## 2017-05-20 DIAGNOSIS — I779 Disorder of arteries and arterioles, unspecified: Secondary | ICD-10-CM

## 2017-05-20 HISTORY — DX: Disorder of arteries and arterioles, unspecified: I77.9

## 2017-05-24 ENCOUNTER — Emergency Department (HOSPITAL_COMMUNITY): Payer: Medicare Other

## 2017-05-24 ENCOUNTER — Other Ambulatory Visit: Payer: Self-pay

## 2017-05-24 ENCOUNTER — Encounter (HOSPITAL_COMMUNITY): Payer: Self-pay

## 2017-05-24 ENCOUNTER — Emergency Department (HOSPITAL_COMMUNITY)
Admission: EM | Admit: 2017-05-24 | Discharge: 2017-05-24 | Disposition: A | Discharge status: Home / Self Care | Payer: Medicare Other | Attending: Emergency Medicine | Admitting: Emergency Medicine

## 2017-05-24 DIAGNOSIS — I5032 Chronic diastolic (congestive) heart failure: Secondary | ICD-10-CM | POA: Insufficient documentation

## 2017-05-24 DIAGNOSIS — Z85118 Personal history of other malignant neoplasm of bronchus and lung: Secondary | ICD-10-CM | POA: Diagnosis not present

## 2017-05-24 DIAGNOSIS — R0789 Other chest pain: Secondary | ICD-10-CM | POA: Insufficient documentation

## 2017-05-24 DIAGNOSIS — E039 Hypothyroidism, unspecified: Secondary | ICD-10-CM | POA: Diagnosis not present

## 2017-05-24 DIAGNOSIS — S62316A Displaced fracture of base of fifth metacarpal bone, right hand, initial encounter for closed fracture: Secondary | ICD-10-CM | POA: Diagnosis not present

## 2017-05-24 DIAGNOSIS — S6981XA Other specified injuries of right wrist, hand and finger(s), initial encounter: Secondary | ICD-10-CM | POA: Diagnosis present

## 2017-05-24 DIAGNOSIS — Y9389 Activity, other specified: Secondary | ICD-10-CM | POA: Diagnosis not present

## 2017-05-24 DIAGNOSIS — W19XXXA Unspecified fall, initial encounter: Secondary | ICD-10-CM

## 2017-05-24 DIAGNOSIS — W010XXA Fall on same level from slipping, tripping and stumbling without subsequent striking against object, initial encounter: Secondary | ICD-10-CM | POA: Insufficient documentation

## 2017-05-24 DIAGNOSIS — R0781 Pleurodynia: Secondary | ICD-10-CM | POA: Diagnosis not present

## 2017-05-24 DIAGNOSIS — Z7982 Long term (current) use of aspirin: Secondary | ICD-10-CM | POA: Diagnosis not present

## 2017-05-24 DIAGNOSIS — S299XXA Unspecified injury of thorax, initial encounter: Secondary | ICD-10-CM | POA: Diagnosis not present

## 2017-05-24 DIAGNOSIS — J449 Chronic obstructive pulmonary disease, unspecified: Secondary | ICD-10-CM | POA: Insufficient documentation

## 2017-05-24 DIAGNOSIS — F17210 Nicotine dependence, cigarettes, uncomplicated: Secondary | ICD-10-CM | POA: Insufficient documentation

## 2017-05-24 DIAGNOSIS — Z79899 Other long term (current) drug therapy: Secondary | ICD-10-CM | POA: Insufficient documentation

## 2017-05-24 DIAGNOSIS — Y999 Unspecified external cause status: Secondary | ICD-10-CM | POA: Diagnosis not present

## 2017-05-24 DIAGNOSIS — S62346A Nondisplaced fracture of base of fifth metacarpal bone, right hand, initial encounter for closed fracture: Secondary | ICD-10-CM

## 2017-05-24 DIAGNOSIS — S62306A Unspecified fracture of fifth metacarpal bone, right hand, initial encounter for closed fracture: Secondary | ICD-10-CM | POA: Diagnosis not present

## 2017-05-24 DIAGNOSIS — Y9226 Movie house or cinema as the place of occurrence of the external cause: Secondary | ICD-10-CM | POA: Diagnosis not present

## 2017-05-24 DIAGNOSIS — T148XXA Other injury of unspecified body region, initial encounter: Secondary | ICD-10-CM | POA: Diagnosis not present

## 2017-05-24 DIAGNOSIS — M79641 Pain in right hand: Secondary | ICD-10-CM | POA: Diagnosis not present

## 2017-05-24 MED ORDER — TRAMADOL HCL 50 MG PO TABS: 50.0000 mg | ORAL_TABLET | Freq: Four times a day (QID) | ORAL | 0 refills | Status: DC | PRN

## 2017-05-24 MED ORDER — TRAMADOL HCL 50 MG PO TABS
50.0000 mg | ORAL_TABLET | Freq: Once | ORAL | Status: AC
  Administered 2017-05-24: 50 mg via ORAL
  Filled 2017-05-24: qty 1

## 2017-05-24 NOTE — ED Provider Notes (Signed)
[facility] DEPT Provider Note   CSN: [id number] Arrival date & time: 05/24/17  1831     History   Chief Complaint Chief Complaint  Patient presents with  . Fall    HPI Joy Williams is a 67 y.o. female. Chief complaint is fall.  HPI:  67 year old female. She tripped over her young granddaughter. She has pain to her left ribs where she struck them against the ground. She didn't break her fall with her right upper extremity. She has pain in her right hand. Did not have loss of consciousness. States his face and cheek struck the floor. She has no pain or symptoms there.  Has bovine mitral valve replacement,  is not anticoagulated.  Past Medical History:  Diagnosis Date  . Allergy   . Asthma    pt denies this, but is on Dulera  . Chronic diastolic congestive heart failure ([place])   . 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 COPD (chronic obstructive pulmonary disease) ([place])   . Family history of adverse reaction to anesthesia    Mother- very sensitive to medication  . Heart murmur    MVP  . Hypothyroidism   . 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 MVP (mitral valve prolapse)   . PONV (postoperative nausea and vomiting)   . Raynaud's disease    Raynaud's  . Raynaud's syndrome   . 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 Severe mitral regurgitation 11/15/2014  . Shortness of breath dyspnea    with exertion  . STD (sexually transmitted disease)   . Telangiectasia   . Thyroid disease     Patient Active Problem List   Diagnosis Date Noted  . Age-related  osteoporosis without current pathological fracture 02/20/2017  . ASCUS of cervix with negative high risk HPV 01/29/2017  . 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 Tobacco use disorder 01/22/2017  . Tobacco abuse counseling 01/22/2017  . Neoplastic malignant related fatigue 01/22/2017  . Vitamin D insufficiency 01/22/2017  . Estrogen deficiency 01/14/2017  . H/O cardiac catheterization- in prep for lung ca sx 01/06/2017  . Hypothyroidism 01/06/2017  . Previous back surgeries 01/06/2017  . 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 Chronic diastolic congestive heart failure ([place])   . Chronic obstructive pulmonary disease ([place])   . 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 Antineoplastic chemotherapy induced anemia 05/05/2015  . History of epistaxis 03/30/2015  . Raynaud's disease 01/19/2015  . Malignant neoplasm of left lung ([place]) 12/30/2014  . Lung cancer ([place]) 12/30/2014  . Malignant neoplasm of overlapping sites of left lung ([place]) 12/06/2014  . Nonintractable episodic headache 12/06/2014  . Severe mitral regurgitation 11/15/2014  . 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 Asthmatic bronchitis , chronic ([place]) 10/03/2014  . Multiple pulmonary nodules 10/03/2014  . Current Cigarette smoker 10/03/2014    Past Surgical History:  Procedure Laterality Date  . BACK SURGERY     x 3  Disectomy  . CARDIAC CATHETERIZATION N/A 03/08/2016   Procedure: Right/Left Heart Cath  and Coronary Angiography;  Surgeon: Sherren Mocha, MD;  Location: [facility];  Service: Cardiovascular;  Laterality: N/A;  . CLAVICLE SURGERY Left 2013   plate to left collar bone  . LAPAROSCOPY     ? reason-age 54   . LUNG CANCER SURGERY    . MITRAL VALVE REPAIR Right 06/27/2016   Procedure: MINIMALLY INVASIVE MITRAL VALVE REPLACEMENT;  Surgeon: Rexene Alberts, MD;  Location: [facility];  Service: Open Heart  Surgery;  Laterality: Right;  . TEE WITHOUT CARDIOVERSION N/A 02/22/2016   Procedure: TRANSESOPHAGEAL ECHOCARDIOGRAM (TEE);  Surgeon: Dorothy Spark, MD;  Location: [place] [facility];  Service: Cardiovascular;  Laterality: N/A;  . TEE WITHOUT CARDIOVERSION N/A 06/27/2016   Procedure: TRANSESOPHAGEAL ECHOCARDIOGRAM (TEE);  Surgeon: Rexene Alberts, MD;  Location: [place];  Service: Open Heart Surgery;  Laterality: N/A;  . TONSILLECTOMY    . VIDEO ASSISTED THORACOSCOPY (VATS)/WEDGE RESECTION Left 12/30/2014   Bronchoscopy, Mediastinoscopy, Left VATS for Wedge resection LUL x2 adn LLL x1 - Dr. Elenor Quinones at [facility]  . VOCAL CORD INJECTION Left 2017   injected with botox  . wrist surgery Left 2015   plate to wrist     OB History    Gravida Para Term Preterm AB Living   3 2 2   1 2    SAB TAB Ectopic Multiple Live Births           2       Home Medications    Prior to Admission medications   Medication Sig Start Date End Date Taking? Authorizing Provider  alendronate (FOSAMAX) 70 MG tablet Take 1 tablet (70 mg total) by mouth every 7 (seven) days. Take with a full glass of water on an empty stomach. 02/20/17   Mellody Dance, DO  aspirin EC 81 MG tablet Take 1 tablet (81 mg total) by mouth daily. 11/04/16   Rexene Alberts, MD  budesonide-formoterol [facility]) 80-4.5 MCG/ACT inhaler Inhale 2 puffs into the lungs 2 (two) times daily. 01/03/17   Tanda Rockers, MD  Cholecalciferol (VITAMIN D3) 5000 units TABS 5,000 IU OTC vitamin D3 daily. 01/22/17   Opalski, Neoma Laming, DO  levothyroxine (SYNTHROID, LEVOTHROID) 175 MCG tablet Take 175 mcg by mouth daily before breakfast.    [provider]  Multiple Vitamins-Minerals (MULTIVITAMIN WITH MINERALS) tablet Take 1 tablet by mouth daily.    [provider]    Family History Family History  Problem Relation Age of Onset  . Mitral valve prolapse Mother   . Dementia Father   . Prostate cancer Father   . Mitral valve prolapse Brother   .  Mitral valve prolapse Sister   . Colon cancer Neg Hx   . Colon polyps Neg Hx   . Rectal cancer Neg Hx   . Esophageal cancer Neg Hx   . Stomach cancer Neg Hx   . Heart attack Neg Hx     Social History Social History   Tobacco Use  . Smoking status: Current Some Day Smoker    Packs/day: 0.25    Years: 24.00    Pack years: 6.00    Types: E-cigarettes, Cigarettes  . Smokeless tobacco: Never Used  Substance Use Topics  . Alcohol use: Yes    Alcohol/week: 1.2 oz    Types: 1 Shots of liquor, 1 Standard drinks or equivalent per week  . Drug use: No     Allergies   Penicillins   Review of Systems Review of Systems  Constitutional: Negative for appetite  change, chills, diaphoresis, fatigue and fever.  HENT: Negative for mouth sores, sore throat and trouble swallowing.   Eyes: Negative for visual disturbance.  Respiratory: Negative for cough, chest tightness, shortness of breath and wheezing.   Cardiovascular: Positive for chest pain.  Gastrointestinal: Negative for abdominal distention, abdominal pain, diarrhea, nausea and vomiting.  Endocrine: Negative for polydipsia, polyphagia and polyuria.  Genitourinary: Negative for dysuria, frequency and hematuria.  Musculoskeletal: Negative for gait problem.       Right hand pain  Skin: Negative for color change, pallor and rash.  Neurological: Negative for dizziness, syncope, light-headedness and headaches.  Hematological: Does not bruise/bleed easily.  Psychiatric/Behavioral: Negative for behavioral problems and confusion.     Physical Exam Updated Vital Signs BP 105/70   Pulse 71   Temp 97.8 F (36.6 C) (Oral)   Resp 16   Ht 5\' 7"  (1.702 m)   Wt 62.6 kg (138 lb)   SpO2 98%   BMI 21.61 kg/m   Physical Exam  Constitutional: She is oriented to person, place, and time. She appears well-developed and well-nourished. No distress.  HENT:  Head: Normocephalic.  Nontender over the maxilla. Normal appearance of the eye and  adnexa. No periorbital edema. No blood from the TMs, mastoids, or from ears nose or mouth.  Eyes: Conjunctivae are normal. Pupils are equal, round, and reactive to light. No scleral icterus.  Neck: Normal range of motion. Neck supple. No thyromegaly present.  Cardiovascular: Normal rate and regular rhythm. Exam reveals no gallop and no friction rub.  No murmur heard. Pulmonary/Chest: Effort normal and breath sounds normal. No respiratory distress. She has no wheezes. She has no rales.  Tenderness without crepitus in the left chest. Normal symmetric breath sounds.  Abdominal: Soft. Bowel sounds are normal. She exhibits no distension. There is no tenderness. There is no rebound.  Musculoskeletal: Normal range of motion.  Tenderness and mild soft tissue swelling at the base of the right hand fifth digit.  Neurological: She is alert and oriented to person, place, and time.  Skin: Skin is warm and dry. No rash noted.  Psychiatric: She has a normal mood and affect. Her behavior is normal.     ED Treatments / Results  Labs (all labs ordered are listed, but only abnormal results are displayed) Labs Reviewed - No data to display  EKG  EKG Interpretation None       Radiology No results found.  Procedures Procedures (including critical care time)  Medications Ordered in ED Medications  traMADol (ULTRAM) tablet 50 mg (not administered)     Initial Impression / Assessment and Plan / ED Course  I have reviewed the triage vital signs and the nursing notes.  Pertinent labs & imaging results that were available during my care of the patient were reviewed by me and considered in my medical decision making (see chart for details).    Rib x-rays normal. Right hand and wrist x-ray showed a right fifth base metacarpal fracture nondisplaced. She's placed an ulnar gutter splint. I reexamined her after this was placed. Given tramadol for pain. Prescription for tramadol. Call hand surgeon for  outpatient appointment.  SPLINT APPLICATION Date/Time: 3:23 PM Authorized by: Lolita Patella Consent: Verbal consent obtained. Risks and benefits: risks, benefits and alternatives were discussed Consent given by: patient Splint applied by: +orthopedic technician Location details: Right ulnar gutter forearm Splint type: ulnar gutter Supplies used: Orthoglass Post-procedure: The splinted body part was neurovascularly unchanged following the procedure. Patient tolerance: Patient  tolerated the procedure well with no immediate complications.     Final Clinical Impressions(s) / ED Diagnoses   Final diagnoses:  Closed nondisplaced fracture of base of fifth metacarpal bone of right hand, initial encounter    ED Discharge Orders    None       Tanna Furry, MD 05/24/17 2019

## 2017-05-24 NOTE — ED Notes (Signed)
Bed: [facility] Expected date: 05/24/17 Expected time: 6:34 PM Means of arrival: Ambulance Comments: Fall- wrist deformity

## 2017-05-24 NOTE — ED Triage Notes (Signed)
Pt comes from home, Pt was watching movie at movie theater, pt had mechanical fall. Pt was walking down after movie. Pt caught herself with right arm. Pt denies LOC, Denies hitting head, Denies neck or back pain. Pt AOx4.

## 2017-05-24 NOTE — Discharge Instructions (Signed)
Splint at all times until seen by Dr. Fredna Dow. Call Dr, Fredna Dow, Hand Surgeon , for appointment. Tramadol for pain.

## 2017-05-26 ENCOUNTER — Telehealth: Payer: Self-pay | Admitting: Family Medicine

## 2017-05-26 NOTE — Telephone Encounter (Signed)
Patient was seen in ED on Saturday for broken hand, she is being set up with Dr. Fredna Dow at the [facility] for this. But while at the ED she was given a prescription for tramadol and she says its too strong for her and wants to talk with someone about other pain med option (even if they are just over the counter).

## 2017-05-27 DIAGNOSIS — M25671 Stiffness of right ankle, not elsewhere classified: Secondary | ICD-10-CM | POA: Diagnosis not present

## 2017-05-27 DIAGNOSIS — M25531 Pain in right wrist: Secondary | ICD-10-CM

## 2017-05-27 DIAGNOSIS — S62346A Nondisplaced fracture of base of fifth metacarpal bone, right hand, initial encounter for closed fracture: Secondary | ICD-10-CM | POA: Insufficient documentation

## 2017-05-27 DIAGNOSIS — M25431 Effusion, right wrist: Secondary | ICD-10-CM | POA: Insufficient documentation

## 2017-05-28 NOTE — Telephone Encounter (Signed)
Spoke to the patient yesterday and per Dr. Raliegh Scarlet advised the patient to take 2 Aleve bid. MPulliam, CMA/RT(R)

## 2017-06-03 DIAGNOSIS — S62346A Nondisplaced fracture of base of fifth metacarpal bone, right hand, initial encounter for closed fracture: Secondary | ICD-10-CM | POA: Diagnosis not present

## 2017-06-24 DIAGNOSIS — S62346D Nondisplaced fracture of base of fifth metacarpal bone, right hand, subsequent encounter for fracture with routine healing: Secondary | ICD-10-CM | POA: Diagnosis not present

## 2017-07-05 ENCOUNTER — Other Ambulatory Visit: Payer: Self-pay | Admitting: Family Medicine

## 2017-07-07 ENCOUNTER — Encounter: Payer: Medicare Other | Admitting: Thoracic Surgery (Cardiothoracic Vascular Surgery)

## 2017-07-14 ENCOUNTER — Encounter: Payer: Medicare Other | Admitting: Thoracic Surgery (Cardiothoracic Vascular Surgery)

## 2017-07-15 DIAGNOSIS — S62346D Nondisplaced fracture of base of fifth metacarpal bone, right hand, subsequent encounter for fracture with routine healing: Secondary | ICD-10-CM | POA: Diagnosis not present

## 2017-07-22 ENCOUNTER — Ambulatory Visit: Payer: Medicare Other | Admitting: Family Medicine

## 2017-08-11 ENCOUNTER — Ambulatory Visit (INDEPENDENT_AMBULATORY_CARE_PROVIDER_SITE_OTHER): Payer: Medicare Other | Admitting: Thoracic Surgery (Cardiothoracic Vascular Surgery)

## 2017-08-11 ENCOUNTER — Encounter: Payer: Self-pay | Admitting: Thoracic Surgery (Cardiothoracic Vascular Surgery)

## 2017-08-11 VITALS — BP 105/60 | HR 73 | Resp 16 | Ht 67.0 in | Wt 142.6 lb

## 2017-08-11 DIAGNOSIS — Z953 Presence of xenogenic heart valve: Secondary | ICD-10-CM | POA: Diagnosis not present

## 2017-08-11 NOTE — Patient Instructions (Signed)
Continue all previous medications without any changes at this time  

## 2017-08-11 NOTE — Progress Notes (Signed)
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  She was last seen here in our office on November 04, 2016 at which time she was doing very well.  Since then she has continued to do fine from a cardiac standpoint.  She was seen in follow-up by Dr. Meda Coffee and underwent routine echocardiogram February 13, 2017.  Left ventricular function was normal with ejection fraction estimated 65-70%.  The bioprosthetic tissue valve was functioning normally.  No other abnormalities were noted.  She returns to our office today and reports that she is doing very well from a cardiac standpoint.  She did fall and break her hand recently.  She is otherwise quite active physically.  She denies any exertional shortness of breath.  Activity level is normal.  She continues to abstain from any tobacco use.   Current Outpatient Medications  Medication Sig Dispense Refill  . aspirin EC 81 MG tablet Take 1 tablet (81 mg total) by mouth daily.    . budesonide-formoterol (SYMBICORT) 80-4.5 MCG/ACT inhaler Inhale 2 puffs into the lungs 2 (two) times daily. 1 Inhaler 11  . Cholecalciferol (VITAMIN D3) 5000 units TABS 5,000 IU OTC vitamin D3 daily. 90 tablet 3  . levothyroxine (SYNTHROID, LEVOTHROID) 175 MCG tablet TAKE 1 TABLET BY MOUTH EVERY DAY 30 tablet 6  . Multiple Vitamins-Minerals (MULTIVITAMIN WITH MINERALS) tablet Take 1 tablet by mouth daily.     No current facility-administered medications  for this visit.       Physical Exam:   BP 105/60 (BP Location: Left Arm, Patient Position: Sitting, Cuff Size: Large)   Pulse 73   Resp 16   Ht 5\' 7"  (1.702 m)   Wt 142 lb 9.6 oz (64.7 kg)   SpO2 (!) 73% Comment: reynaud's  BMI 22.33 kg/m   General:  Well-appearing  Chest:   Clear to auscultation  CV:   Regular rate and rhythm without murmur  Incisions:  Completely healed  Abdomen:  Soft nontender  Extremities:  Warm and well perfused  Diagnostic Tests:  Transthoracic Echocardiography  Patient:    Joy Williams, Joy Williams MR #:       [id number] Study Date: 02/13/2017 Gender:     F Age:        30 Height:     166.4 cm Weight:     60.3 kg BSA:        1.67 m^2 Pt. Status: Room:   ATTENDING    Candee Furbish, M.D.  SONOGRAPHER  [place], RDCS  ORDERING     Ena Dawley, M.D.  REFERRING    Ena Dawley, M.D.  PERFORMING   [facility], Outpatient  cc:  ------------------------------------------------------------------- LV EF: 65% -   70%  ------------------------------------------------------------------- Indications:      MVR (Z95.3).  ------------------------------------------------------------------- History:   PMH:   Congestive heart failure.  Mitral valve disease. Chronic obstructive pulmonary disease.  Risk factors:  Lung cancer. Scleroderma. Current tobacco use.  -------------------------------------------------------------------  Study Conclusions  - Left ventricle: The cavity size was normal. Wall thickness was   normal. Systolic function was vigorous. The estimated ejection   fraction was in the range of 65% to 70%. Wall motion was normal;   there were no regional wall motion abnormalities. Doppler   parameters are consistent with abnormal left ventricular   relaxation (grade 1 diastolic dysfunction). - Mitral valve: A bioprosthesis was present and functioning   normally.  ------------------------------------------------------------------- Labs,  prior tests, procedures, and surgery: Transthoracic echocardiography (09/26/2016).     EF was 65%.  Valve surgery.     Mitral valve replacement with a bioprosthetic valve.  ------------------------------------------------------------------- Study data:  Comparison was made to the study of 09/26/2016.  Study status:  Routine.  Procedure:  The patient reported no pain pre or post test. Transthoracic echocardiography. Image quality was adequate.  Study completion:  There were no complications. Transthoracic echocardiography.  M-mode, complete 2D, 3D, spectral Doppler, and color Doppler.  Birthdate:  Patient birthdate: [birth year].  Age:  Patient is 67 yr old.  Sex:  Gender: female. BMI: 21.8 kg/m^2.  Blood pressure:     102/60  Patient status: Outpatient.  Study date:  Study date: 02/13/2017. Study time: 02:43 PM.  Location:  [facility]  -------------------------------------------------------------------  ------------------------------------------------------------------- Left ventricle:  The cavity size was normal. Wall thickness was normal. Systolic function was vigorous. The estimated ejection fraction was in the range of 65% to 70%. Wall motion was normal; there were no regional wall motion abnormalities. Doppler parameters are consistent with abnormal left ventricular relaxation (grade 1 diastolic dysfunction).  ------------------------------------------------------------------- Aortic valve:   Trileaflet; normal thickness leaflets. Mobility was not restricted.  Doppler:  Transvalvular velocity was within the normal range. There was no stenosis. There was no regurgitation.   ------------------------------------------------------------------- Aorta:  Aortic root: The aortic root was normal in size.  ------------------------------------------------------------------- Mitral valve:  A bioprosthesis was present and functioning normally.  Doppler:  Transvalvular  velocity was within the normal range. There was no evidence for stenosis. There was trivial regurgitation.    Valve area by pressure half-time: 2.89 cm^2. Indexed valve area by pressure half-time: 1.73 cm^2/m^2. Valve area by continuity equation (using LVOT flow): 2.12 cm^2. Indexed valve area by continuity equation (using LVOT flow): 1.27 cm^2/m^2. Mean gradient (D): 3 mm Hg. Peak gradient (D): 4 mm Hg.  ------------------------------------------------------------------- Left atrium:  The atrium was normal in size.  ------------------------------------------------------------------- Right ventricle:  The cavity size was normal. Wall thickness was normal. Systolic function was normal.  ------------------------------------------------------------------- Pulmonic valve:    Structurally normal valve.   Cusp separation was normal.  Doppler:  Transvalvular velocity was within the normal range. There was no evidence for stenosis. There was trivial regurgitation.  ------------------------------------------------------------------- Tricuspid valve:   Structurally normal valve.    Doppler: Transvalvular velocity was within the normal range. There was no regurgitation.  ------------------------------------------------------------------- Pulmonary artery:   The main pulmonary artery was normal-sized. Systolic pressure was within the normal range.  ------------------------------------------------------------------- Right atrium:  The atrium was normal in size.  ------------------------------------------------------------------- Pericardium:  There was no pericardial effusion.  ------------------------------------------------------------------- Systemic veins: Inferior vena cava: The vessel was normal in size.  ------------------------------------------------------------------- Measurements   Left ventricle                            Value          Reference  LV ID, ED, PLAX  chordal           (  L)     42.5  mm       43 - 52  LV ID, ES, PLAX chordal           (L)     18.9  mm       23 - 38  LV fx shortening, PLAX chordal            56    %        >=29  LV PW thickness, ED                       9.27  mm       ---------  IVS/LV PW ratio, ED                       1.04           <=1.3  Stroke volume, 2D                         68    ml       ---------  Stroke volume/bsa, 2D                     41    ml/m^2   ---------  LV e&', lateral                            5.08  cm/s     ---------  LV E/e&', lateral                          19.29          ---------  LV e&', medial                             4.19  cm/s     ---------  LV E/e&', medial                           23.39          ---------  LV e&', average                            4.64  cm/s     ---------  LV E/e&', average                          21.14          ---------    Ventricular septum                        Value          Reference  IVS thickness, ED                         9.62  mm       ---------    LVOT                                      Value          Reference  LVOT ID, S  18    mm       ---------  LVOT area                                 2.54  cm^2     ---------  LVOT peak velocity, S                     149   cm/s     ---------  LVOT mean velocity, S                     94.2  cm/s     ---------  LVOT VTI, S                               26.6  cm       ---------  LVOT peak gradient, S                     9     mm Hg    ---------    Aorta                                     Value          Reference  Aortic root ID, ED                        33    mm       ---------  Ascending aorta ID, A-P, S                28    mm       ---------    Left atrium                               Value          Reference  LA ID, A-P, ES                            34    mm       ---------  LA ID/bsa, A-P                            2.03  cm/m^2   <=2.2  LA volume, S                               46.9  ml       ---------  LA volume/bsa, S                          28.1  ml/m^2   ---------  LA volume, ES, 1-p A4C                    44.2  ml       ---------  LA volume/bsa, ES, 1-p A4C                26.4  ml/m^2   ---------  LA volume, ES, 1-p A2C  48.1  ml       ---------  LA volume/bsa, ES, 1-p A2C                28.8  ml/m^2   ---------    Mitral valve                              Value          Reference  Mitral E-wave peak velocity               98    cm/s     ---------  Mitral A-wave peak velocity               95.1  cm/s     ---------  Mitral mean velocity, D                   79.9  cm/s     ---------  Mitral deceleration time          (H)     243   ms       150 - 230  Mitral pressure half-time                 76    ms       ---------  Mitral mean gradient, D                   3     mm Hg    ---------  Mitral peak gradient, D                   4     mm Hg    ---------  Mitral E/A ratio, peak                    1              ---------  Mitral valve area, PHT, DP                2.89  cm^2     ---------  Mitral valve area/bsa, PHT, DP            1.73  cm^2/m^2 ---------  Mitral valve area, LVOT                   2.12  cm^2     ---------  continuity  Mitral valve area/bsa, LVOT               1.27  cm^2/m^2 ---------  continuity  Mitral annulus VTI, D                     31.8  cm       ---------    Systemic veins                            Value          Reference  Estimated CVP                             3     mm Hg    ---------    Right ventricle                           Value  Reference  TAPSE                                     11    mm       ---------  RV s&', lateral, S                         10.1  cm/s     ---------    Pulmonic valve                            Value          Reference  Pulmonic regurg velocity, ED              142   cm/s     ---------  Pulmonic regurg gradient, ED              8     mm Hg     ---------  Legend: (L)  and  (H)  mark values outside specified reference range.  ------------------------------------------------------------------- Prepared and Electronically Authenticated by  Candee Furbish, M.D. 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  She reports normal exercise tolerance with no symptoms of exertional shortness of breath or chest discomfort whatsoever.  Late follow-up echocardiogram looks good with normal functioning tissue valve in the mitral position and normal left ventricular systolic function.  Plan:  We have not recommended any changes the patient's current medications.  She will continue to follow-up with Dr. Meda Coffee periodically and return to our office in the future only should specific problems or questions arise.  All of her questions have been answered.  I spent in excess of 10 minutes during the conduct of this office consultation and >50% of this time involved direct face-to-face encounter with the patient for counseling and/or coordination of their care.    Valentina Gu. Roxy Manns, MD 08/11/2017 1:08 PM

## 2017-08-12 DIAGNOSIS — S62346D Nondisplaced fracture of base of fifth metacarpal bone, right hand, subsequent encounter for fracture with routine healing: Secondary | ICD-10-CM | POA: Diagnosis not present

## 2017-09-02 DIAGNOSIS — S62346D Nondisplaced fracture of base of fifth metacarpal bone, right hand, subsequent encounter for fracture with routine healing: Secondary | ICD-10-CM | POA: Diagnosis not present

## 2017-09-04 DIAGNOSIS — M25641 Stiffness of right hand, not elsewhere classified: Secondary | ICD-10-CM | POA: Diagnosis not present

## 2017-09-04 DIAGNOSIS — S62346D Nondisplaced fracture of base of fifth metacarpal bone, right hand, subsequent encounter for fracture with routine healing: Secondary | ICD-10-CM | POA: Diagnosis not present

## 2017-09-04 DIAGNOSIS — M25631 Stiffness of right wrist, not elsewhere classified: Secondary | ICD-10-CM | POA: Diagnosis not present

## 2017-09-04 DIAGNOSIS — M25431 Effusion, right wrist: Secondary | ICD-10-CM | POA: Diagnosis not present

## 2017-09-04 DIAGNOSIS — M25531 Pain in right wrist: Secondary | ICD-10-CM | POA: Diagnosis not present

## 2017-09-11 DIAGNOSIS — S62346D Nondisplaced fracture of base of fifth metacarpal bone, right hand, subsequent encounter for fracture with routine healing: Secondary | ICD-10-CM | POA: Diagnosis not present

## 2017-09-11 DIAGNOSIS — M25531 Pain in right wrist: Secondary | ICD-10-CM | POA: Diagnosis not present

## 2017-09-11 DIAGNOSIS — M25631 Stiffness of right wrist, not elsewhere classified: Secondary | ICD-10-CM | POA: Diagnosis not present

## 2017-09-11 DIAGNOSIS — M25431 Effusion, right wrist: Secondary | ICD-10-CM | POA: Diagnosis not present

## 2017-09-11 DIAGNOSIS — M25641 Stiffness of right hand, not elsewhere classified: Secondary | ICD-10-CM | POA: Diagnosis not present

## 2017-09-18 DIAGNOSIS — M25431 Effusion, right wrist: Secondary | ICD-10-CM | POA: Diagnosis not present

## 2017-09-18 DIAGNOSIS — M25531 Pain in right wrist: Secondary | ICD-10-CM | POA: Diagnosis not present

## 2017-09-18 DIAGNOSIS — M25631 Stiffness of right wrist, not elsewhere classified: Secondary | ICD-10-CM | POA: Diagnosis not present

## 2017-09-18 DIAGNOSIS — S62346D Nondisplaced fracture of base of fifth metacarpal bone, right hand, subsequent encounter for fracture with routine healing: Secondary | ICD-10-CM | POA: Diagnosis not present

## 2017-09-18 DIAGNOSIS — M25641 Stiffness of right hand, not elsewhere classified: Secondary | ICD-10-CM | POA: Diagnosis not present

## 2017-09-23 DIAGNOSIS — M25641 Stiffness of right hand, not elsewhere classified: Secondary | ICD-10-CM | POA: Diagnosis not present

## 2017-09-23 DIAGNOSIS — M25531 Pain in right wrist: Secondary | ICD-10-CM | POA: Diagnosis not present

## 2017-09-23 DIAGNOSIS — S62346D Nondisplaced fracture of base of fifth metacarpal bone, right hand, subsequent encounter for fracture with routine healing: Secondary | ICD-10-CM | POA: Diagnosis not present

## 2017-09-23 DIAGNOSIS — M25631 Stiffness of right wrist, not elsewhere classified: Secondary | ICD-10-CM | POA: Diagnosis not present

## 2017-09-23 DIAGNOSIS — M25431 Effusion, right wrist: Secondary | ICD-10-CM | POA: Diagnosis not present

## 2017-09-25 DIAGNOSIS — S62346D Nondisplaced fracture of base of fifth metacarpal bone, right hand, subsequent encounter for fracture with routine healing: Secondary | ICD-10-CM | POA: Diagnosis not present

## 2017-09-25 DIAGNOSIS — M25431 Effusion, right wrist: Secondary | ICD-10-CM | POA: Diagnosis not present

## 2017-09-25 DIAGNOSIS — M25531 Pain in right wrist: Secondary | ICD-10-CM | POA: Diagnosis not present

## 2017-09-25 DIAGNOSIS — M25641 Stiffness of right hand, not elsewhere classified: Secondary | ICD-10-CM | POA: Diagnosis not present

## 2017-09-25 DIAGNOSIS — M25631 Stiffness of right wrist, not elsewhere classified: Secondary | ICD-10-CM | POA: Diagnosis not present

## 2017-09-30 ENCOUNTER — Encounter: Payer: Self-pay | Admitting: Cardiology

## 2017-09-30 ENCOUNTER — Ambulatory Visit (INDEPENDENT_AMBULATORY_CARE_PROVIDER_SITE_OTHER): Payer: Medicare Other | Admitting: Cardiology

## 2017-09-30 VITALS — BP 124/72 | HR 78 | Ht 67.0 in | Wt 141.2 lb

## 2017-09-30 DIAGNOSIS — S62346D Nondisplaced fracture of base of fifth metacarpal bone, right hand, subsequent encounter for fracture with routine healing: Secondary | ICD-10-CM | POA: Diagnosis not present

## 2017-09-30 DIAGNOSIS — I6523 Occlusion and stenosis of bilateral carotid arteries: Secondary | ICD-10-CM

## 2017-09-30 DIAGNOSIS — Z953 Presence of xenogenic heart valve: Secondary | ICD-10-CM | POA: Diagnosis not present

## 2017-09-30 DIAGNOSIS — I5189 Other ill-defined heart diseases: Secondary | ICD-10-CM

## 2017-09-30 DIAGNOSIS — I341 Nonrheumatic mitral (valve) prolapse: Secondary | ICD-10-CM | POA: Diagnosis not present

## 2017-09-30 NOTE — Progress Notes (Signed)
09/30/2017 Joy Williams   December 16, 1950  [id number]  Primary Physician Mellody Dance, DO Primary Cardiologist: Dr. 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 Roxy Manns, on 06/27/16. Her post operative course was uncomplicated. She was placed on coumadin. She is followed in our office by Dr. Meda Coffee. We also follow her INRs. Prior to undergoing valve surgery, she had a diagnostic LHC that showed normal coronaries. Her lung CA is followed at [facility]. Her most recent follow-up chest CT scan was performed in March 2017 at which time there was no sign of locally recurrent disease.  She presents back to clinic for post hospital f/u. She is here with her daughter. She reports that she has done fairly well. No chest pain. No significant dyspnea. She gets a little fatigue if she tries to do too much. After her hospitalization, she was having issues with low BP and dizziness, however Dr. Roxy Manns decrease her metoprolol from BID to once daily. She takes it at night and this has helped her. Her BP is stable in clinic today. She is w/o symptoms. EKG shows NSR. Dr. Roxy Manns has cleared her to resume driving and to start cardiac rehab.   10/23/2016 - 3 months follow-up, the patient feels great, she has completed 12 weeks of cardiac rehabilitation. The patient states that overall she feels significantly better than before surgery. She has experienced  dizziness and orthostatic hypotension and blurry vision. That has all completely resolved after she discontinued metoprolol. He was also seen by ophthalmologist. Carotid ultrasound was checked that showed 40-59% bilateral stenosis. She underwent echocardiography in May that showed hyperdynamic LVEF 65-70% normally functioning mitral valve with completely normal transmitral gradients. She denies lower extremity edema, no orthopnea no paroxysmal nocturnal dyspnea and no palpitations. No syncope. She has been compliant to her meds but her INRs were frequently under 2. No bleeding.  Sep 30, 2017, Ms. Landing is coming after 6 months, she is feeling great, she can do all the activities she likes to, she makes about 4000 steps a day and enjoys time with her grandchildren.  She broke her right wrist while playing with her granddaughter in January, it did not require surgery.  She has mild orthostatic hypotension in the mornings but no dizziness or syncope.  She has no lower extremity edema orthopnea proximal nocturnal dyspnea no chest pain. She states that she quit smoking a year ago.  Current Meds  Medication Sig  . aspirin EC 81 MG tablet Take 1 tablet (81 mg total) by mouth daily.  . budesonide-formoterol (SYMBICORT) 80-4.5 MCG/ACT inhaler Inhale 2 puffs into the lungs 2 (two) times daily.  . Cholecalciferol (VITAMIN D3) 5000 units TABS 5,000 IU OTC vitamin D3 daily.  Marland Kitchen levothyroxine (SYNTHROID, LEVOTHROID) 175 MCG tablet TAKE 1 TABLET BY MOUTH EVERY DAY  . Multiple Vitamins-Minerals (MULTIVITAMIN WITH MINERALS) tablet Take 1 tablet by mouth daily.   Allergies  Allergen Reactions  . Penicillins Hives    Unknown, occurred as a child Has patient had a PCN  reaction causing immediate rash, facial/tongue/throat swelling, SOB or lightheadedness with hypotension: Yes Has patient had a PCN reaction causing severe rash involving mucus membranes or skin necrosis: No Has patient had a PCN reaction that required  hospitalization  Has patient had a PCN reaction occurring within the last 10 years: No If all of the above answers are "NO", then may proceed with Cephalosporin use. No   Past Medical History:  Diagnosis Date  . Allergy   . Asthma    pt denies this, but is on Dulera  . Chronic diastolic congestive heart failure ([place])   . 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 COPD (chronic obstructive pulmonary disease) ([place])   . Family history of adverse reaction to anesthesia    Mother- very sensitive to medication  . Heart murmur    MVP  . Hypothyroidism   . Lung cancer ([place]) 12/30/2014   Synchronous primaries:  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, each treated by wedge resection with post-op adjuvant [facility]Rx at [facility]  . MVP (mitral valve prolapse)   . PONV (postoperative nausea and vomiting)   . Raynaud's disease    Raynaud's  . Raynaud's syndrome   . 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   31 mm [facility] mitral bovine bioprosthetic tissue valve placed via right mini thoracotomy approach  . Severe mitral regurgitation 11/15/2014  . Shortness of breath dyspnea    with exertion  . STD (sexually transmitted disease)   . Telangiectasia   . Thyroid disease    Family History  Problem Relation Age of Onset  . Mitral valve prolapse Mother   . Dementia Father   . Prostate cancer Father   . Mitral valve prolapse Brother   . Mitral valve prolapse Sister   . Colon cancer Neg Hx   . Colon polyps Neg Hx   . Rectal cancer Neg Hx   . Esophageal cancer Neg Hx   . Stomach cancer Neg Hx   . Heart attack Neg Hx    Past Surgical History:  Procedure Laterality Date  . BACK SURGERY     x 3  Disectomy  . CARDIAC CATHETERIZATION N/A 03/08/2016   Procedure: Right/Left Heart Cath and Coronary Angiography;  Surgeon: Sherren Mocha, MD;   Location: [place] CV LAB;  Service: Cardiovascular;  Laterality: N/A;  . CLAVICLE SURGERY Left 2013   plate to left collar bone  . LAPAROSCOPY     ? reason-age 9   . LUNG CANCER SURGERY    . MITRAL VALVE REPAIR Right 06/27/2016   Procedure: MINIMALLY INVASIVE MITRAL VALVE REPLACEMENT;  Surgeon: Rexene Alberts, MD;  Location: [place];  Service: Open Heart Surgery;  Laterality: Right;  . TEE WITHOUT CARDIOVERSION N/A 02/22/2016   Procedure: TRANSESOPHAGEAL ECHOCARDIOGRAM (TEE);  Surgeon: Dorothy Spark, MD;  Location: [place];  Service: Cardiovascular;  Laterality: N/A;  . TEE WITHOUT CARDIOVERSION N/A 06/27/2016   Procedure: TRANSESOPHAGEAL ECHOCARDIOGRAM (TEE);  Surgeon: Rexene Alberts, MD;  Location: [place];  Service: Open Heart Surgery;  Laterality: N/A;  . TONSILLECTOMY    . VIDEO ASSISTED THORACOSCOPY (VATS)/WEDGE RESECTION Left 12/30/2014   Bronchoscopy, Mediastinoscopy, Left VATS for Wedge resection LUL x2 adn LLL x1 - Dr. Elenor Quinones at [facility]  . VOCAL CORD INJECTION Left 2017   injected with botox  . wrist surgery  Left 2015   plate to wrist    Social History   Socioeconomic History  . Marital status: Divorced    Spouse name: Not on file  . Number of children: Not on file  . Years of education: Not on file  . Highest education level: Not on file  Occupational History  . Not on file  Social Needs  . Financial resource strain: Not on file  . Food insecurity:    Worry: Not on file    Inability: Not on file  . Transportation needs:    Medical: Not on file    Non-medical: Not on file  Tobacco Use  . Smoking status: Current Some Day Smoker    Packs/day: 0.25    Years: 24.00    Pack years: 6.00    Types: E-cigarettes, Cigarettes  . Smokeless tobacco: Never Used  Substance and Sexual Activity  . Alcohol use: Yes    Alcohol/week: 1.2 oz    Types: 1 Shots of liquor, 1 Standard drinks or equivalent per week  . Drug use: No  . Sexual activity: Never    Partners: Male     Birth control/protection: Post-menopausal  Lifestyle  . Physical activity:    Days per week: Not on file    Minutes per session: Not on file  . Stress: Not on file  Relationships  . Social connections:    Talks on phone: Not on file    Gets together: Not on file    Attends religious service: Not on file    Active member of club or organization: Not on file    Attends meetings of clubs or organizations: Not on file    Relationship status: Not on file  . Intimate partner violence:    Fear of current or ex partner: Not on file    Emotionally abused: Not on file    Physically abused: Not on file    Forced sexual activity: Not on file  Other Topics Concern  . Not on file  Social History Narrative  . Not on file     Review of Systems: General: negative for chills, fever, night sweats or weight changes.  Cardiovascular: negative for chest pain, dyspnea on exertion, edema, orthopnea, palpitations, paroxysmal nocturnal dyspnea or shortness of breath Dermatological: negative for rash Respiratory: negative for cough or wheezing Urologic: negative for hematuria Abdominal: negative for nausea, vomiting, diarrhea, bright red blood per rectum, melena, or hematemesis Neurologic: negative for visual changes, syncope, or dizziness All other systems reviewed and are otherwise negative except as noted above.   Physical Exam:  Blood pressure 124/72, pulse 78, height 5\' 7"  (1.702 m), weight 141 lb 3.2 oz (64 kg), SpO2 93 %.  General appearance: alert, cooperative, no distress and thin appearing  Neck: no carotid bruit and no JVD Lungs: clear to auscultation bilaterally Heart: regular rate and rhythm, S1, S2 normal, no murmur, click, rub or gallop Extremities: extremities normal, atraumatic, no cyanosis or edema Pulses: 2+ and symmetric Skin: Skin color, texture, turgor normal. No rashes or lesions Neurologic: Grossly normal  TTE: 09/26/2016 Left ventricle: The cavity size was normal. There  was mild   concentric hypertrophy. Systolic function was vigorous. The   estimated ejection fraction was in the range of 65% to 70%. Wall   motion was normal; there were no regional wall motion   abnormalities. Features are consistent with a pseudonormal left   ventricular filling pattern, with concomitant abnormal relaxation   and increased filling pressure (grade 2  diastolic dysfunction).   Doppler parameters are consistent with high ventricular filling   pressure. - Aortic valve: Trileaflet; mildly thickened, mildly calcified   leaflets. - Mitral valve: A bioprosthesis was present and functioning   normally. - Atrial septum: There was increased thickness of the septum,   consistent with lipomatous hypertrophy. - Pulmonary arteries: Systolic pressure could not be accurately   estimated.  Impressions: - No cardiac source of emboli was indentified.  EKG NSR. No ischemia.     ASSESSMENT AND PLAN:   1. H/o MVP with MR: s/p recent minimally invasive mitral valve replacement with a bioprosthetic valve, per Dr. Roxy Manns, on 06/27/16. TEE during operation showed normal LVEF of 60-65%. 3 months follow-up echocardiogram in May 2018 showed hyperdynamic LVEF 65-70% with grade 2 diastolic dysfunction and normal transmitral gradient. Normal right-sided pressures.   Follow up echo in 01/2017 showed hyperdynamic LVEF 65 to 70% with improved diastolic dysfunction now only grade 1.  Normal transmitral gradients with mean of 3 mmHg.  She was discharged from Dr. Roxy Manns clinic.  2.  Chronic diastolic CHF -previously with grade 2 diastolic dysfunction that has improved to grade 1 diastolic dysfunction that is age-appropriate, she is euvolemic and NYHA class I.  3. H/o Lung Cancer: treated by and followed at [facility]. Last CT scan showed no recurrence.  She is going to obtain her follow-up scan next months.  4. Bilateral carotid stenosis - moderate on carotid ultrasound in April 2018 with 40-59% stenosis  bilaterally. We will recheck now.  PLAN: 1 year follow up.  Ena Dawley, MD 09/30/2017 9:27 AM

## 2017-09-30 NOTE — Patient Instructions (Signed)
Medication Instructions:   Your physician recommends that you continue on your current medications as directed. Please refer to the Current Medication list given to you today.    Testing/Procedures:  Your physician has requested that you have a carotid duplex. This test is an ultrasound of the carotid arteries in your neck. It looks at blood flow through these arteries that supply the brain with blood. Allow one hour for this exam. There are no restrictions or special instructions.     Follow-Up:  Your physician wants you to follow-up in: [place] will receive a reminder letter in the mail two months in advance. If you don't receive a letter, please call our office to schedule the follow-up appointment.        If you need a refill on your cardiac medications before your next appointment, please call your pharmacy.

## 2017-10-01 ENCOUNTER — Ambulatory Visit (HOSPITAL_COMMUNITY)
Admission: RE | Admit: 2017-10-01 | Discharge: 2017-10-01 | Disposition: A | Discharge status: Home / Self Care | Payer: Medicare Other | Source: Ambulatory Visit | Attending: Internal Medicine | Admitting: Internal Medicine

## 2017-10-01 DIAGNOSIS — I6523 Occlusion and stenosis of bilateral carotid arteries: Secondary | ICD-10-CM

## 2017-10-02 DIAGNOSIS — S62346D Nondisplaced fracture of base of fifth metacarpal bone, right hand, subsequent encounter for fracture with routine healing: Secondary | ICD-10-CM | POA: Diagnosis not present

## 2017-10-02 DIAGNOSIS — M25431 Effusion, right wrist: Secondary | ICD-10-CM | POA: Diagnosis not present

## 2017-10-02 DIAGNOSIS — M25531 Pain in right wrist: Secondary | ICD-10-CM | POA: Diagnosis not present

## 2017-10-02 DIAGNOSIS — M25631 Stiffness of right wrist, not elsewhere classified: Secondary | ICD-10-CM | POA: Diagnosis not present

## 2017-10-02 DIAGNOSIS — M25641 Stiffness of right hand, not elsewhere classified: Secondary | ICD-10-CM | POA: Diagnosis not present

## 2017-10-09 DIAGNOSIS — M25641 Stiffness of right hand, not elsewhere classified: Secondary | ICD-10-CM | POA: Diagnosis not present

## 2017-10-09 DIAGNOSIS — M25531 Pain in right wrist: Secondary | ICD-10-CM | POA: Diagnosis not present

## 2017-10-09 DIAGNOSIS — M25431 Effusion, right wrist: Secondary | ICD-10-CM | POA: Diagnosis not present

## 2017-10-09 DIAGNOSIS — M25631 Stiffness of right wrist, not elsewhere classified: Secondary | ICD-10-CM | POA: Diagnosis not present

## 2017-10-09 DIAGNOSIS — S62346D Nondisplaced fracture of base of fifth metacarpal bone, right hand, subsequent encounter for fracture with routine healing: Secondary | ICD-10-CM | POA: Diagnosis not present

## 2017-10-14 ENCOUNTER — Ambulatory Visit (INDEPENDENT_AMBULATORY_CARE_PROVIDER_SITE_OTHER): Payer: Medicare Other | Admitting: Adult Health

## 2017-10-14 ENCOUNTER — Encounter: Payer: Self-pay | Admitting: Adult Health

## 2017-10-14 VITALS — BP 116/72 | HR 60 | Temp 98.3°F | Ht 67.0 in | Wt 140.0 lb

## 2017-10-14 DIAGNOSIS — R8290 Unspecified abnormal findings in urine: Secondary | ICD-10-CM | POA: Diagnosis not present

## 2017-10-14 DIAGNOSIS — I6523 Occlusion and stenosis of bilateral carotid arteries: Secondary | ICD-10-CM

## 2017-10-14 DIAGNOSIS — J029 Acute pharyngitis, unspecified: Secondary | ICD-10-CM

## 2017-10-14 DIAGNOSIS — R300 Dysuria: Secondary | ICD-10-CM | POA: Diagnosis not present

## 2017-10-14 LAB — POCT URINALYSIS DIPSTICK
Bilirubin, UA: NEGATIVE
Glucose, UA: NEGATIVE
KETONES UA: NEGATIVE
NITRITE UA: NEGATIVE
PH UA: 6.5 (ref 5.0–8.0)
PROTEIN UA: NEGATIVE
SPEC GRAV UA: 1.01 (ref 1.010–1.025)
UROBILINOGEN UA: 0.2 U/dL

## 2017-10-14 MED ORDER — NITROFURANTOIN MONOHYD MACRO 100 MG PO CAPS: 100.0000 mg | ORAL_CAPSULE | Freq: Two times a day (BID) | ORAL | 0 refills | Status: DC

## 2017-10-14 NOTE — Assessment & Plan Note (Signed)
UA: Blo- trace Leu- moderate Specimen sent for C/S Started on Macrodantin 100mg  BID, 7 days Push fluids

## 2017-10-14 NOTE — Progress Notes (Signed)
Subjective:    Patient ID: Joy Williams, female    DOB: [birth year], 67 y.o.   MRN: [id number]  HPI:  [name] presents with urinary frequency and "feeling like I have to go, even though I just went".  Sx's have been steadily worsening since middle of last week.  She has been increasing fluids and starting drinking cranberry juice as well. She denies abdominal/flank pain She denies N/V/D/hematuria/hematochezia She reports '"I just really feel uncomfortable" She denies hx of UTI She also reports "my throat feeling full" the last few days. She denies HA/fever She reports that her granddaughter was dx'd with strep last week  Patient Care Team    Relationship Specialty Notifications Start End  Mellody Dance, DO PCP - General Family Medicine  01/06/17   Dorothy Spark, MD Consulting Physician Cardiology  01/06/17   Tanda Rockers, MD Consulting Physician Pulmonary Disease  01/06/17   Hurley Cisco, MD Consulting Physician Rheumatology  01/06/17   Lerry Paterson, MD Referring Physician   01/06/17    Comment: lung ca Surgeon  Lissa Morales, MD Referring Physician Internal Medicine  01/06/17    Comment: Oncologist- Chemo doc    Patient Active Problem List   Diagnosis Date Noted  . Dysuria 10/14/2017  . Abnormal finding on urinalysis 10/14/2017  . Pharyngitis 10/14/2017  . Age-related osteoporosis without current pathological fracture 02/20/2017  . ASCUS of cervix with negative high risk HPV 01/29/2017  . 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 Tobacco use disorder 01/22/2017  . Tobacco abuse counseling 01/22/2017  . Neoplastic malignant related fatigue 01/22/2017  . Vitamin D insufficiency 01/22/2017  . Estrogen deficiency 01/14/2017  . H/O cardiac catheterization- in prep for lung ca sx 01/06/2017  . Hypothyroidism 01/06/2017  . Previous back surgeries 01/06/2017  . S/P minimally invasive mitral  valve replacement with bioprosthetic valve 06/27/2016  . Chronic diastolic congestive heart failure ([place])   . Chronic obstructive pulmonary disease ([place])   . 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 Antineoplastic chemotherapy induced anemia 05/05/2015  . History of epistaxis 03/30/2015  . Raynaud's disease 01/19/2015  . Malignant neoplasm of left lung ([place]) 12/30/2014  . Lung cancer ([place]) 12/30/2014  . Malignant neoplasm of overlapping sites of left lung ([place]) 12/06/2014  . Nonintractable episodic headache 12/06/2014  . Severe mitral regurgitation 11/15/2014  . Scleroderma ([place]) 10/03/2014  . Asthmatic bronchitis , chronic ([place]) 10/03/2014  . Multiple pulmonary nodules 10/03/2014  . Current Cigarette smoker 10/03/2014     Past Medical History:  Diagnosis Date  . Allergy   . Asthma    pt denies this, but is on Dulera  . Chronic diastolic congestive heart failure ([place])   . 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 COPD (chronic obstructive pulmonary disease) ([place])   . Family history of adverse reaction to anesthesia    Mother- very sensitive to medication  . Heart murmur    MVP  . Hypothyroidism   . Lung cancer ([place]) 12/30/2014   Synchronous primaries:  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, each treated by wedge resection with post-op adjuvant chemoRx at [facility]  . MVP (mitral valve prolapse)   . PONV (postoperative nausea and vomiting)   . Raynaud's disease    Raynaud's  . Raynaud's syndrome   .  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   31 mm [facility] mitral bovine bioprosthetic tissue valve placed via right mini thoracotomy approach  . Severe mitral regurgitation 11/15/2014  . Shortness of breath dyspnea    with exertion  . STD (sexually transmitted disease)   . Telangiectasia   . Thyroid disease       Past Surgical History:  Procedure Laterality Date  . BACK SURGERY     x 3  Disectomy  . CARDIAC CATHETERIZATION N/A 03/08/2016   Procedure: Right/Left Heart Cath and Coronary Angiography;  Surgeon: Sherren Mocha, MD;  Location: [place] CV LAB;  Service: Cardiovascular;  Laterality: N/A;  . CLAVICLE SURGERY Left 2013   plate to left collar bone  . LAPAROSCOPY     ? reason-age 71   . LUNG CANCER SURGERY    . MITRAL VALVE REPAIR Right 06/27/2016   Procedure: MINIMALLY INVASIVE MITRAL VALVE REPLACEMENT;  Surgeon: Rexene Alberts, MD;  Location: [place];  Service: Open Heart Surgery;  Laterality: Right;  . TEE WITHOUT CARDIOVERSION N/A 02/22/2016   Procedure: TRANSESOPHAGEAL ECHOCARDIOGRAM (TEE);  Surgeon: Dorothy Spark, MD;  Location: [place];  Service: Cardiovascular;  Laterality: N/A;  . TEE WITHOUT CARDIOVERSION N/A 06/27/2016   Procedure: TRANSESOPHAGEAL ECHOCARDIOGRAM (TEE);  Surgeon: Rexene Alberts, MD;  Location: [place];  Service: Open Heart Surgery;  Laterality: N/A;  . TONSILLECTOMY    . VIDEO ASSISTED THORACOSCOPY (VATS)/WEDGE RESECTION Left 12/30/2014   Bronchoscopy, Mediastinoscopy, Left VATS for Wedge resection LUL x2 adn LLL x1 - Dr. Elenor Quinones at [facility]  . VOCAL CORD INJECTION Left 2017   injected with botox  . wrist surgery Left 2015   plate to wrist      Family History  Problem Relation Age of Onset  . Mitral valve prolapse Mother   . Dementia Father   . Prostate cancer Father   . Mitral valve prolapse Brother   . Mitral valve prolapse Sister   . Colon cancer Neg Hx   . Colon polyps Neg Hx   . Rectal cancer Neg Hx   . Esophageal cancer Neg Hx   . Stomach cancer Neg Hx   . Heart attack Neg Hx      Social History   Substance and Sexual Activity  Drug Use No     Social History   Substance and Sexual Activity  Alcohol Use Yes  . Alcohol/week: 1.2 oz  . Types: 1 Shots of liquor, 1 Standard drinks or equivalent per week     Social History    Tobacco Use  Smoking Status Current Some Day Smoker  . Packs/day: 0.25  . Years: 24.00  . Pack years: 6.00  . Types: E-cigarettes, Cigarettes  Smokeless Tobacco Never Used     Outpatient Encounter Medications as of 10/14/2017  Medication Sig  . aspirin EC 81 MG tablet Take 1 tablet (81 mg total) by mouth daily.  . budesonide-formoterol (SYMBICORT) 80-4.5 MCG/ACT inhaler Inhale 2 puffs into the lungs 2 (two) times daily.  . Cholecalciferol (VITAMIN D3) 5000 units TABS 5,000 IU OTC vitamin D3 daily.  Marland Kitchen levothyroxine (SYNTHROID, LEVOTHROID) 175 MCG tablet TAKE 1 TABLET BY MOUTH EVERY DAY  . Multiple Vitamins-Minerals (MULTIVITAMIN WITH MINERALS) tablet Take 1 tablet by mouth daily.  . nitrofurantoin, macrocrystal-monohydrate, (MACROBID) 100 MG capsule Take 1 capsule (100 mg total) by mouth 2 (two) times daily.   No facility-administered encounter medications on file as of 10/14/2017.     Allergies:  Penicillins  Body mass index is 21.93 kg/m.  Blood pressure 116/72, pulse 60, temperature 98.3 F (36.8 C), temperature source Oral, height 5\' 7"  (1.702 m), weight 140 lb (63.5 kg), SpO2 99 %.  Review of Systems  Constitutional: Positive for fatigue. Negative for activity change, appetite change, chills, diaphoresis, fever and unexpected weight change.  HENT: Positive for sore throat. Negative for congestion, nosebleeds, sinus pressure and sinus pain.   Eyes: Negative for visual disturbance.  Respiratory: Negative for cough, chest tightness, shortness of breath, wheezing and stridor.   Cardiovascular: Negative for chest pain, palpitations and leg swelling.  Gastrointestinal: Negative for abdominal distention, abdominal pain, blood in stool, constipation, diarrhea, nausea and vomiting.  Endocrine: Negative for cold intolerance, heat intolerance, polydipsia, polyphagia and polyuria.  Genitourinary: Positive for frequency and urgency. Negative for difficulty urinating, dysuria, flank  pain, genital sores, hematuria and pelvic pain.  Neurological: Negative for dizziness and headaches.  Hematological: Does not bruise/bleed easily.       Objective:   Physical Exam  Constitutional: She is oriented to person, place, and time. She appears well-developed and well-nourished.  Non-toxic appearance. She does not appear ill.  HENT:  Head: Normocephalic.  Right Ear: Hearing, tympanic membrane and ear canal normal.  Left Ear: Hearing, tympanic membrane and ear canal normal.  Mouth/Throat: Uvula is midline and mucous membranes are normal. No oral lesions. No uvula swelling. Posterior oropharyngeal erythema present. No oropharyngeal exudate, posterior oropharyngeal edema or tonsillar abscesses.  Eyes: Pupils are equal, round, and reactive to light. EOM are normal.  Cardiovascular: Normal rate, regular rhythm, normal heart sounds and intact distal pulses.  No murmur heard. Pulmonary/Chest: Effort normal and breath sounds normal. No stridor. No respiratory distress. She has no rhonchi.  Abdominal: Soft. Bowel sounds are normal. She exhibits no distension and no mass. There is no tenderness. There is no rebound and no guarding. No hernia.  Neurological: She is alert and oriented to person, place, and time.  Skin: Skin is warm and dry. Capillary refill takes less than 2 seconds. No pallor.  Psychiatric: She has a normal mood and affect. Her behavior is normal.      Assessment & Plan:   1. Dysuria   2. Abnormal finding on urinalysis   3. Pharyngitis, unspecified etiology     Dysuria UA: Blo- trace Leu- moderate Specimen sent for C/S Started on Macrodantin 100mg  BID, 7 days Push fluids  Pharyngitis Push fluids Salt water gargle    FOLLOW-UP:  Return if symptoms worsen or fail to improve.

## 2017-10-14 NOTE — Patient Instructions (Signed)
Urinary Tract Infection, Adult A urinary tract infection (UTI) is an infection of any part of the urinary tract. The urinary tract includes the:  Kidneys.  Ureters.  Bladder.  Urethra.  These organs make, store, and get rid of pee (urine) in the body. Follow these instructions at home:  Take over-the-counter and prescription medicines only as told by your doctor.  If you were prescribed an antibiotic medicine, take it as told by your doctor. Do not stop taking the antibiotic even if you start to feel better.  Avoid the following drinks: ? Alcohol. ? Caffeine. ? Tea. ? Carbonated drinks.  Drink enough fluid to keep your pee clear or pale yellow.  Keep all follow-up visits as told by your doctor. This is important.  Make sure to: ? Empty your bladder often and completely. Do not to hold pee for long periods of time. ? Empty your bladder before and after sex. ? Wipe from front to back after a bowel movement if you are female. Use each tissue one time when you wipe. Contact a doctor if:  You have back pain.  You have a fever.  You feel sick to your stomach (nauseous).  You throw up (vomit).  Your symptoms do not get better after 3 days.  Your symptoms go away and then come back. Get help right away if:  You have very bad back pain.  You have very bad lower belly (abdominal) pain.  You are throwing up and cannot keep down any medicines or water. This information is not intended to replace advice given to you by your health care provider. Make sure you discuss any questions you have with your health care provider. Document Released: 10/23/2007 Document Revised: 10/12/2015 Document Reviewed: 03/27/2015 Elsevier Interactive Patient Education  2018 [employer].  Continue to push fluids. Please take Macrodantin as directed. We will send urine specimen off for culture and call you with the results. If symptoms persist after antibiotic completed, then please call  clinic. FEEL BETTER!

## 2017-10-14 NOTE — Assessment & Plan Note (Signed)
Push fluids Salt water gargle

## 2017-10-17 DIAGNOSIS — M25431 Effusion, right wrist: Secondary | ICD-10-CM | POA: Diagnosis not present

## 2017-10-17 DIAGNOSIS — M25641 Stiffness of right hand, not elsewhere classified: Secondary | ICD-10-CM | POA: Diagnosis not present

## 2017-10-17 DIAGNOSIS — M25631 Stiffness of right wrist, not elsewhere classified: Secondary | ICD-10-CM | POA: Diagnosis not present

## 2017-10-17 DIAGNOSIS — M25531 Pain in right wrist: Secondary | ICD-10-CM | POA: Diagnosis not present

## 2017-10-17 DIAGNOSIS — S62346D Nondisplaced fracture of base of fifth metacarpal bone, right hand, subsequent encounter for fracture with routine healing: Secondary | ICD-10-CM | POA: Diagnosis not present

## 2017-10-18 LAB — CULTURE, URINE COMPREHENSIVE

## 2017-10-23 DIAGNOSIS — M25631 Stiffness of right wrist, not elsewhere classified: Secondary | ICD-10-CM | POA: Diagnosis not present

## 2017-10-23 DIAGNOSIS — S62346D Nondisplaced fracture of base of fifth metacarpal bone, right hand, subsequent encounter for fracture with routine healing: Secondary | ICD-10-CM | POA: Diagnosis not present

## 2017-10-23 DIAGNOSIS — M25641 Stiffness of right hand, not elsewhere classified: Secondary | ICD-10-CM | POA: Diagnosis not present

## 2017-10-23 DIAGNOSIS — M25531 Pain in right wrist: Secondary | ICD-10-CM | POA: Diagnosis not present

## 2017-10-23 DIAGNOSIS — M25431 Effusion, right wrist: Secondary | ICD-10-CM | POA: Diagnosis not present

## 2017-11-12 DIAGNOSIS — S62346D Nondisplaced fracture of base of fifth metacarpal bone, right hand, subsequent encounter for fracture with routine healing: Secondary | ICD-10-CM | POA: Diagnosis not present

## 2017-11-19 ENCOUNTER — Ambulatory Visit: Payer: Medicare Other | Admitting: Family Medicine

## 2018-01-22 DIAGNOSIS — Z79899 Other long term (current) drug therapy: Secondary | ICD-10-CM | POA: Diagnosis not present

## 2018-01-22 DIAGNOSIS — R918 Other nonspecific abnormal finding of lung field: Secondary | ICD-10-CM | POA: Diagnosis not present

## 2018-01-22 DIAGNOSIS — J8410 Pulmonary fibrosis, unspecified: Secondary | ICD-10-CM | POA: Diagnosis not present

## 2018-01-22 DIAGNOSIS — Z87891 Personal history of nicotine dependence: Secondary | ICD-10-CM | POA: Diagnosis not present

## 2018-01-22 DIAGNOSIS — C3482 Malignant neoplasm of overlapping sites of left bronchus and lung: Secondary | ICD-10-CM | POA: Diagnosis not present

## 2018-01-22 DIAGNOSIS — Z952 Presence of prosthetic heart valve: Secondary | ICD-10-CM | POA: Diagnosis not present

## 2018-01-22 DIAGNOSIS — Z7982 Long term (current) use of aspirin: Secondary | ICD-10-CM | POA: Diagnosis not present

## 2018-01-30 ENCOUNTER — Other Ambulatory Visit: Payer: Self-pay | Admitting: Family Medicine

## 2018-02-16 ENCOUNTER — Telehealth: Payer: Self-pay | Admitting: Family Medicine

## 2018-02-16 NOTE — Telephone Encounter (Signed)
Recieved message from medical assistant to contact pt to set up 3 month f/u for pt as provider required--- forwarding message that pt coming 10/29 @ 2:15 for OV.  -glh

## 2018-02-16 NOTE — Telephone Encounter (Signed)
Noted MPulliam, CMA/RT(R)  

## 2018-02-28 ENCOUNTER — Other Ambulatory Visit: Payer: Self-pay | Admitting: Family Medicine

## 2018-03-11 ENCOUNTER — Inpatient Hospital Stay (HOSPITAL_COMMUNITY)
Admission: EM | Admit: 2018-03-11 | Discharge: 2018-03-12 | DRG: 062 | Disposition: A | Discharge status: Home / Self Care | Payer: Medicare Other | Attending: Neurology | Admitting: Neurology

## 2018-03-11 ENCOUNTER — Encounter (HOSPITAL_COMMUNITY): Payer: Self-pay | Admitting: Pharmacy Technician

## 2018-03-11 ENCOUNTER — Emergency Department (HOSPITAL_COMMUNITY): Payer: Medicare Other

## 2018-03-11 ENCOUNTER — Other Ambulatory Visit: Payer: Self-pay

## 2018-03-11 ENCOUNTER — Inpatient Hospital Stay (HOSPITAL_COMMUNITY): Payer: Medicare Other

## 2018-03-11 DIAGNOSIS — R51 Headache: Secondary | ICD-10-CM | POA: Diagnosis not present

## 2018-03-11 DIAGNOSIS — Z8042 Family history of malignant neoplasm of prostate: Secondary | ICD-10-CM

## 2018-03-11 DIAGNOSIS — I5032 Chronic diastolic (congestive) heart failure: Secondary | ICD-10-CM | POA: Diagnosis present

## 2018-03-11 DIAGNOSIS — Z880 Allergy status to penicillin: Secondary | ICD-10-CM | POA: Diagnosis not present

## 2018-03-11 DIAGNOSIS — J449 Chronic obstructive pulmonary disease, unspecified: Secondary | ICD-10-CM | POA: Diagnosis present

## 2018-03-11 DIAGNOSIS — G8194 Hemiplegia, unspecified affecting left nondominant side: Secondary | ICD-10-CM | POA: Diagnosis present

## 2018-03-11 DIAGNOSIS — Z954 Presence of other heart-valve replacement: Secondary | ICD-10-CM | POA: Diagnosis not present

## 2018-03-11 DIAGNOSIS — I781 Nevus, non-neoplastic: Secondary | ICD-10-CM | POA: Diagnosis present

## 2018-03-11 DIAGNOSIS — Z85118 Personal history of other malignant neoplasm of bronchus and lung: Secondary | ICD-10-CM | POA: Diagnosis not present

## 2018-03-11 DIAGNOSIS — I6523 Occlusion and stenosis of bilateral carotid arteries: Secondary | ICD-10-CM | POA: Diagnosis not present

## 2018-03-11 DIAGNOSIS — Z23 Encounter for immunization: Secondary | ICD-10-CM

## 2018-03-11 DIAGNOSIS — Z79890 Hormone replacement therapy (postmenopausal): Secondary | ICD-10-CM

## 2018-03-11 DIAGNOSIS — Z716 Tobacco abuse counseling: Secondary | ICD-10-CM

## 2018-03-11 DIAGNOSIS — I639 Cerebral infarction, unspecified: Secondary | ICD-10-CM

## 2018-03-11 DIAGNOSIS — F17290 Nicotine dependence, other tobacco product, uncomplicated: Secondary | ICD-10-CM | POA: Diagnosis present

## 2018-03-11 DIAGNOSIS — R200 Anesthesia of skin: Secondary | ICD-10-CM

## 2018-03-11 DIAGNOSIS — I5030 Unspecified diastolic (congestive) heart failure: Secondary | ICD-10-CM | POA: Diagnosis not present

## 2018-03-11 DIAGNOSIS — F17210 Nicotine dependence, cigarettes, uncomplicated: Secondary | ICD-10-CM | POA: Diagnosis present

## 2018-03-11 DIAGNOSIS — R2990 Unspecified symptoms and signs involving the nervous system: Secondary | ICD-10-CM | POA: Diagnosis present

## 2018-03-11 DIAGNOSIS — G43109 Migraine with aura, not intractable, without status migrainosus: Secondary | ICD-10-CM | POA: Diagnosis not present

## 2018-03-11 DIAGNOSIS — I7300 Raynaud's syndrome without gangrene: Secondary | ICD-10-CM | POA: Diagnosis present

## 2018-03-11 DIAGNOSIS — I6521 Occlusion and stenosis of right carotid artery: Secondary | ICD-10-CM | POA: Diagnosis not present

## 2018-03-11 DIAGNOSIS — R270 Ataxia, unspecified: Secondary | ICD-10-CM | POA: Diagnosis not present

## 2018-03-11 DIAGNOSIS — Z902 Acquired absence of lung [part of]: Secondary | ICD-10-CM

## 2018-03-11 DIAGNOSIS — Z7982 Long term (current) use of aspirin: Secondary | ICD-10-CM

## 2018-03-11 DIAGNOSIS — Z9221 Personal history of antineoplastic chemotherapy: Secondary | ICD-10-CM

## 2018-03-11 DIAGNOSIS — E785 Hyperlipidemia, unspecified: Secondary | ICD-10-CM

## 2018-03-11 DIAGNOSIS — C3490 Malignant neoplasm of unspecified part of unspecified bronchus or lung: Secondary | ICD-10-CM | POA: Diagnosis present

## 2018-03-11 DIAGNOSIS — Z7951 Long term (current) use of inhaled steroids: Secondary | ICD-10-CM

## 2018-03-11 DIAGNOSIS — Z9289 Personal history of other medical treatment: Secondary | ICD-10-CM

## 2018-03-11 DIAGNOSIS — Z800 Family history of malignant neoplasm of digestive organs: Secondary | ICD-10-CM

## 2018-03-11 DIAGNOSIS — C3492 Malignant neoplasm of unspecified part of left bronchus or lung: Secondary | ICD-10-CM | POA: Diagnosis present

## 2018-03-11 DIAGNOSIS — Z87891 Personal history of nicotine dependence: Secondary | ICD-10-CM | POA: Diagnosis present

## 2018-03-11 DIAGNOSIS — Z79899 Other long term (current) drug therapy: Secondary | ICD-10-CM | POA: Diagnosis not present

## 2018-03-11 DIAGNOSIS — R2689 Other abnormalities of gait and mobility: Secondary | ICD-10-CM | POA: Diagnosis not present

## 2018-03-11 DIAGNOSIS — E039 Hypothyroidism, unspecified: Secondary | ICD-10-CM | POA: Diagnosis present

## 2018-03-11 DIAGNOSIS — J4489 Other specified chronic obstructive pulmonary disease: Secondary | ICD-10-CM | POA: Diagnosis present

## 2018-03-11 DIAGNOSIS — R42 Dizziness and giddiness: Secondary | ICD-10-CM | POA: Diagnosis not present

## 2018-03-11 DIAGNOSIS — I10 Essential (primary) hypertension: Secondary | ICD-10-CM | POA: Diagnosis not present

## 2018-03-11 DIAGNOSIS — R0902 Hypoxemia: Secondary | ICD-10-CM | POA: Diagnosis not present

## 2018-03-11 DIAGNOSIS — R531 Weakness: Secondary | ICD-10-CM | POA: Diagnosis not present

## 2018-03-11 LAB — DIFFERENTIAL
ABS IMMATURE GRANULOCYTES: 0.05 10*3/uL (ref 0.00–0.07)
Basophils Absolute: 0 10*3/uL (ref 0.0–0.1)
Basophils Relative: 0 %
Eosinophils Absolute: 0 10*3/uL (ref 0.0–0.5)
Eosinophils Relative: 0 %
Immature Granulocytes: 1 %
LYMPHS ABS: 0.9 10*3/uL (ref 0.7–4.0)
Lymphocytes Relative: 10 %
MONOS PCT: 6 %
Monocytes Absolute: 0.5 10*3/uL (ref 0.1–1.0)
Neutro Abs: 7.4 10*3/uL (ref 1.7–7.7)
Neutrophils Relative %: 83 %

## 2018-03-11 LAB — COMPREHENSIVE METABOLIC PANEL
ALBUMIN: 3.8 g/dL (ref 3.5–5.0)
ALT: 17 U/L (ref 0–44)
AST: 29 U/L (ref 15–41)
Alkaline Phosphatase: 56 U/L (ref 38–126)
Anion gap: 16 — ABNORMAL HIGH (ref 5–15)
BUN: 14 mg/dL (ref 8–23)
CO2: 23 mmol/L (ref 22–32)
Calcium: 9.6 mg/dL (ref 8.9–10.3)
Chloride: 98 mmol/L (ref 98–111)
Creatinine, Ser: 0.73 mg/dL (ref 0.44–1.00)
GFR calc Af Amer: >60 mL/min (ref >60)
GLUCOSE: 90 mg/dL (ref 70–99)
Potassium: 4.1 mmol/L (ref 3.5–5.1)
Sodium: 137 mmol/L (ref 135–145)
TOTAL PROTEIN: 7 g/dL (ref 6.5–8.1)
Total Bilirubin: 0.5 mg/dL (ref 0.3–1.2)

## 2018-03-11 LAB — I-STAT CHEM 8, ED
BUN: 16 mg/dL (ref 8–23)
CALCIUM ION: 1.13 mmol/L — AB (ref 1.15–1.40)
CHLORIDE: 99 mmol/L (ref 98–111)
CREATININE: 0.7 mg/dL (ref 0.44–1.00)
Glucose, Bld: 93 mg/dL (ref 70–99)
HCT: 42 % (ref 36.0–46.0)
Hemoglobin: 14.3 g/dL (ref 12.0–15.0)
Potassium: 4.3 mmol/L (ref 3.5–5.1)
Sodium: 135 mmol/L (ref 135–145)
TCO2: 29 mmol/L (ref 22–32)

## 2018-03-11 LAB — I-STAT TROPONIN, ED: TROPONIN I, POC: 0.06 ng/mL (ref 0.00–0.08)

## 2018-03-11 LAB — CBC
HEMATOCRIT: 42.7 % (ref 36.0–46.0)
Hemoglobin: 13 g/dL (ref 12.0–15.0)
MCH: 27 pg (ref 26.0–34.0)
MCHC: 30.4 g/dL (ref 30.0–36.0)
MCV: 88.6 fL (ref 80.0–100.0)
NRBC: 0 % (ref 0.0–0.2)
Platelets: 159 10*3/uL (ref 150–400)
RBC: 4.82 MIL/uL (ref 3.87–5.11)
RDW: 12.5 % (ref 11.5–15.5)
WBC: 9 10*3/uL (ref 4.0–10.5)

## 2018-03-11 LAB — URINALYSIS, ROUTINE W REFLEX MICROSCOPIC
BILIRUBIN URINE: NEGATIVE
GLUCOSE, UA: NEGATIVE mg/dL
Hgb urine dipstick: NEGATIVE
KETONES UR: 5 mg/dL — AB
LEUKOCYTES UA: NEGATIVE
NITRITE: NEGATIVE
PH: 6 (ref 5.0–8.0)
Protein, ur: NEGATIVE mg/dL
Specific Gravity, Urine: 1.034 — ABNORMAL HIGH (ref 1.005–1.030)

## 2018-03-11 LAB — RAPID URINE DRUG SCREEN, HOSP PERFORMED
Amphetamines: NOT DETECTED
BARBITURATES: NOT DETECTED
Benzodiazepines: NOT DETECTED
COCAINE: NOT DETECTED
OPIATES: NOT DETECTED
Tetrahydrocannabinol: NOT DETECTED

## 2018-03-11 LAB — MRSA PCR SCREENING: MRSA by PCR: NEGATIVE

## 2018-03-11 LAB — APTT: APTT: 27 s (ref 24–36)

## 2018-03-11 LAB — PROTIME-INR
INR: 1
PROTHROMBIN TIME: 13.1 s (ref 11.4–15.2)

## 2018-03-11 LAB — ETHANOL: Alcohol, Ethyl (B): <10 mg/dL (ref <10)

## 2018-03-11 LAB — CBG MONITORING, ED: Glucose-Capillary: 81 mg/dL (ref 70–99)

## 2018-03-11 MED ORDER — SODIUM CHLORIDE 0.9 % IV SOLN
50.0000 mL | Freq: Once | INTRAVENOUS | Status: AC
  Administered 2018-03-11: 50 mL via INTRAVENOUS

## 2018-03-11 MED ORDER — ACETAMINOPHEN 325 MG PO TABS
650.0000 mg | ORAL_TABLET | Freq: Four times a day (QID) | ORAL | Status: DC | PRN
  Administered 2018-03-11 – 2018-03-12 (×2): 650 mg via ORAL
  Filled 2018-03-11 (×2): qty 2

## 2018-03-11 MED ORDER — PANTOPRAZOLE SODIUM 40 MG PO TBEC
40.0000 mg | DELAYED_RELEASE_TABLET | Freq: Every day | ORAL | Status: DC
  Administered 2018-03-12: 40 mg via ORAL
  Filled 2018-03-11: qty 1

## 2018-03-11 MED ORDER — SODIUM CHLORIDE 0.9 % IV SOLN
INTRAVENOUS | Status: DC
  Administered 2018-03-11 (×2): via INTRAVENOUS

## 2018-03-11 MED ORDER — ALTEPLASE (STROKE) FULL DOSE INFUSION
0.9000 mg/kg | Freq: Once | INTRAVENOUS | Status: AC
  Administered 2018-03-11: 58.9 mg via INTRAVENOUS
  Filled 2018-03-11: qty 100

## 2018-03-11 MED ORDER — IOPAMIDOL (ISOVUE-370) INJECTION 76%
50.0000 mL | Freq: Once | INTRAVENOUS | Status: AC | PRN
  Administered 2018-03-11: 50 mL via INTRAVENOUS

## 2018-03-11 MED ORDER — ACETAMINOPHEN 325 MG PO TABS
650.0000 mg | ORAL_TABLET | Freq: Once | ORAL | Status: AC
  Administered 2018-03-11: 650 mg via ORAL
  Filled 2018-03-11: qty 2

## 2018-03-11 MED ORDER — STROKE: EARLY STAGES OF RECOVERY BOOK
Freq: Once | Status: DC
  Filled 2018-03-11: qty 1

## 2018-03-11 MED ORDER — IOPAMIDOL (ISOVUE-370) INJECTION 76%
INTRAVENOUS | Status: AC
  Filled 2018-03-11: qty 50

## 2018-03-11 MED ORDER — INFLUENZA VAC SPLIT HIGH-DOSE 0.5 ML IM SUSY
0.5000 mL | PREFILLED_SYRINGE | INTRAMUSCULAR | Status: DC
  Filled 2018-03-11: qty 0.5

## 2018-03-11 NOTE — Code Documentation (Signed)
Patient reports headache has decreased to 5/10. Pt educated to make clinical staff aware if the headache worsens or becomes different. Pt verbalized understanding.

## 2018-03-11 NOTE — Progress Notes (Signed)
Pharmacist Code Stroke Response  Notified to mix tPA at 1240 by Dr. Lorraine Lax Delivered tPA to RN at 1244  tPA dose = 5.9mg  bolus over 1 minute followed by 53mg  for a total dose of 58.9mg  over 1 hour  Issues/delays encountered (if applicable): Needed to obtain weight and final CT read from radiologist  Willford Rabideau, Rande Lawman 03/11/18 12:35 PM

## 2018-03-11 NOTE — ED Provider Notes (Signed)
[facility] EMERGENCY DEPARTMENT Provider Note   CSN: [id number] Arrival date & time: 03/11/18  1122     History   Chief Complaint Chief Complaint  Patient presents with  . Stroke Symptoms    HPI Joy Williams is a 67 y.o. female.  The history is provided by the patient, medical records and a relative. No language interpreter was used.  Cerebrovascular Accident  This is a new problem. The current episode started 3 to 5 hours ago. The problem occurs constantly. The problem has not changed since onset.Associated symptoms include headaches. Pertinent negatives include no chest pain, no abdominal pain and no shortness of breath. Nothing aggravates the symptoms. Nothing relieves the symptoms. She has tried nothing for the symptoms. The treatment provided no relief.    Past Medical History:  Diagnosis Date  . Allergy   . Asthma    pt denies this, but is on Dulera  . Chronic diastolic congestive heart failure ([place])   . 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 COPD (chronic obstructive pulmonary disease) ([place])   . Family history of adverse reaction to anesthesia    Mother- very sensitive to medication  . Heart murmur    MVP  . Hypothyroidism   . 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 MVP (mitral valve prolapse)   . PONV (postoperative nausea and vomiting)   . Raynaud's disease    Raynaud's  . Raynaud's syndrome   . 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 Severe mitral regurgitation 11/15/2014  . Shortness of breath dyspnea    with exertion  . STD (sexually transmitted  disease)   . Telangiectasia   . Thyroid disease     Patient Active Problem List   Diagnosis Date Noted  . Dysuria 10/14/2017  . Abnormal finding on urinalysis 10/14/2017  . Pharyngitis 10/14/2017  . Age-related osteoporosis without current pathological fracture 02/20/2017  . ASCUS of cervix with negative high risk HPV 01/29/2017  . 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 Tobacco use disorder 01/22/2017  . Tobacco abuse counseling 01/22/2017  . Neoplastic malignant related fatigue 01/22/2017  . Vitamin D insufficiency 01/22/2017  . Estrogen deficiency 01/14/2017  . H/O cardiac catheterization- in prep for lung ca sx 01/06/2017  . Hypothyroidism 01/06/2017  . Previous back surgeries 01/06/2017  . 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 Chronic diastolic congestive heart failure ([place])   . Chronic obstructive pulmonary disease ([place])   . 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 Antineoplastic chemotherapy induced anemia 05/05/2015  . History of epistaxis 03/30/2015  . Raynaud's disease 01/19/2015  . Malignant neoplasm of left lung ([place]) 12/30/2014  . Lung cancer ([place]) 12/30/2014  . Malignant neoplasm of overlapping sites of left lung ([place]) 12/06/2014  . Nonintractable episodic headache 12/06/2014  . Severe mitral regurgitation 11/15/2014  . Scleroderma ([place]) 10/03/2014  . Asthmatic bronchitis , chronic ([place]) 10/03/2014  . Multiple pulmonary nodules 10/03/2014  . Current Cigarette smoker 10/03/2014    Past Surgical History:  Procedure  Laterality Date  . BACK SURGERY     x 3  Disectomy  . CARDIAC CATHETERIZATION N/A 03/08/2016   Procedure: Right/Left Heart Cath and Coronary Angiography;  Surgeon: Sherren Mocha, MD;  Location: [facility];  Service: Cardiovascular;  Laterality: N/A;  . CLAVICLE SURGERY Left 2013   plate to left collar bone  .  LAPAROSCOPY     ? reason-age 24   . LUNG CANCER SURGERY    . MITRAL VALVE REPAIR Right 06/27/2016   Procedure: MINIMALLY INVASIVE MITRAL VALVE REPLACEMENT;  Surgeon: Rexene Alberts, MD;  Location: [facility];  Service: Open Heart Surgery;  Laterality: Right;  . TEE WITHOUT CARDIOVERSION N/A 02/22/2016   Procedure: TRANSESOPHAGEAL ECHOCARDIOGRAM (TEE);  Surgeon: Dorothy Spark, MD;  Location: [facility];  Service: Cardiovascular;  Laterality: N/A;  . TEE WITHOUT CARDIOVERSION N/A 06/27/2016   Procedure: TRANSESOPHAGEAL ECHOCARDIOGRAM (TEE);  Surgeon: Rexene Alberts, MD;  Location: [facility];  Service: Open Heart Surgery;  Laterality: N/A;  . TONSILLECTOMY    . VIDEO ASSISTED THORACOSCOPY (VATS)/WEDGE RESECTION Left 12/30/2014   Bronchoscopy, Mediastinoscopy, Left VATS for Wedge resection LUL x2 adn LLL x1 - Dr. Elenor Quinones at [facility]  . VOCAL CORD INJECTION Left 2017   injected with botox  . wrist surgery Left 2015   plate to wrist      OB History    Gravida  3   Para  2   Term  2   Preterm      AB  1   Living  2     SAB      TAB      Ectopic      Multiple      Live Births  2            Home Medications    Prior to Admission medications   Medication Sig Start Date End Date Taking? Authorizing Provider  aspirin EC 81 MG tablet Take 1 tablet (81 mg total) by mouth daily. 11/04/16   Rexene Alberts, MD  budesonide-formoterol [facility]) 80-4.5 MCG/ACT inhaler Inhale 2 puffs into the lungs 2 (two) times daily. 01/03/17   Tanda Rockers, MD  Cholecalciferol (VITAMIN D3) 5000 units TABS 5,000 IU OTC vitamin D3 daily. 01/22/17   Opalski, Neoma Laming, DO  levothyroxine (SYNTHROID, LEVOTHROID) 175 MCG tablet TAKEK1 TABLET BY MOUTH DAILY**PATIENT NEEDS OFFICE VISIT FOR FURTHER REFILLS** 03/02/18   Mellody Dance, DO  Multiple Vitamins-Minerals (MULTIVITAMIN WITH MINERALS) tablet Take 1 tablet by mouth daily.    [provider]  nitrofurantoin, macrocrystal-monohydrate, (MACROBID)  100 MG capsule Take 1 capsule (100 mg total) by mouth 2 (two) times daily. 10/14/17   Esaw Grandchild, NP    Family History Family History  Problem Relation Age of Onset  . Mitral valve prolapse Mother   . Dementia Father   . Prostate cancer Father   . Mitral valve prolapse Brother   . Mitral valve prolapse Sister   . Colon cancer Neg Hx   . Colon polyps Neg Hx   . Rectal cancer Neg Hx   . Esophageal cancer Neg Hx   . Stomach cancer Neg Hx   . Heart attack Neg Hx     Social History Social History   Tobacco Use  . Smoking status: Current Some Day Smoker    Packs/day: 0.25    Years: 24.00    Pack years: 6.00    Types: E-cigarettes, Cigarettes  . Smokeless tobacco: Never Used  Substance Use  Topics  . Alcohol use: Yes    Alcohol/week: 2.0 standard drinks    Types: 1 Shots of liquor, 1 Standard drinks or equivalent per week  . Drug use: No     Allergies   Penicillins   Review of Systems Review of Systems  Constitutional: Negative for chills, diaphoresis, fatigue and fever.  HENT: Negative for congestion.   Eyes: Negative for visual disturbance.  Respiratory: Negative for cough, chest tightness and shortness of breath.   Cardiovascular: Negative for chest pain, palpitations and leg swelling.  Gastrointestinal: Negative for abdominal pain, constipation, diarrhea, nausea and rectal pain.  Musculoskeletal: Positive for gait problem. Negative for back pain, neck pain and neck stiffness.  Skin: Negative for rash and wound.  Neurological: Positive for numbness and headaches. Negative for seizures, facial asymmetry, weakness and light-headedness.       Ataxia in L arm   Psychiatric/Behavioral: Negative for agitation.  All other systems reviewed and are negative.    Physical Exam Updated Vital Signs BP 105/89 (BP Location: Right Arm)   Pulse 72   Temp 97.9 F (36.6 C) (Oral)   Resp 18   SpO2 98%   Physical Exam  Constitutional: She is oriented to person, place,  and time. She appears well-developed and well-nourished. No distress.  HENT:  Head: Normocephalic and atraumatic.  Mouth/Throat: Oropharynx is clear and moist.  Eyes: Conjunctivae are normal.  Neck: Neck supple.  Cardiovascular: Normal rate and regular rhythm.  Murmur heard. Pulmonary/Chest: Effort normal and breath sounds normal. No respiratory distress.  Abdominal: Soft. There is no tenderness.  Musculoskeletal: She exhibits no edema or tenderness.  Neurological: She is alert and oriented to person, place, and time. She displays no tremor. A sensory deficit is present. No cranial nerve deficit. She exhibits normal muscle tone. Coordination abnormal. GCS eye subscore is 4. GCS verbal subscore is 5. GCS motor subscore is 6.  Patient had ataxia with left arm finger-nose-finger testing.  Decreased sensation in left arm.  Symmetric grip strength on my initial exam.  Normal strength and sensation in legs.  Normal speech.  Extraocular movements intact with normal pupils.  No facial droop.  Gait initially deferred due to code stroke activation and her report that she is falling to the left when standing.  Skin: Skin is warm and dry. Rash (telangictasias on face at baseline) noted. She is not diaphoretic. No erythema.  Psychiatric: She has a normal mood and affect.  Nursing note and vitals reviewed.    ED Treatments / Results  Labs (all labs ordered are listed, but only abnormal results are displayed) Labs Reviewed  COMPREHENSIVE METABOLIC PANEL - Abnormal; Notable for the following components:      Result Value   Anion gap 16 (*)    All other components within normal limits  I-STAT CHEM 8, ED - Abnormal; Notable for the following components:   Calcium, Ion 1.13 (*)    All other components within normal limits  MRSA PCR SCREENING  PROTIME-INR  APTT  CBC  DIFFERENTIAL  ETHANOL  RAPID URINE DRUG SCREEN, HOSP PERFORMED  URINALYSIS, ROUTINE W REFLEX MICROSCOPIC  HIV ANTIBODY (ROUTINE  TESTING W REFLEX)  I-STAT TROPONIN, ED  CBG MONITORING, ED    EKG EKG Interpretation  Date/Time:  Wednesday March 11 2018 11:29:51 EDT Ventricular Rate:  76 PR Interval:    QRS Duration: 114 QT Interval:  426 QTC Calculation: 479 R Axis:   -3 Text Interpretation:  Sinus rhythm Prominent P waves, nondiagnostic Probable  left ventricular hypertrophy Artifact in lead(s) V2 and baseline wander in lead(s) V2 When compared to prior, resolved LBBB.  No STEMI Confirmed by Antony Blackbird [phone]) on 03/11/2018 12:16:26 PM   Radiology Ct Angio Head W Or Wo Contrast  Result Date: 03/11/2018 CLINICAL DATA:  Headache, gait difficulty.  Imbalance. EXAM: CT ANGIOGRAPHY HEAD AND NECK TECHNIQUE: Multidetector CT imaging of the head and neck was performed using the standard protocol during bolus administration of intravenous contrast. Multiplanar CT image reconstructions and MIPs were obtained to evaluate the vascular anatomy. Carotid stenosis measurements (when applicable) are obtained utilizing NASCET criteria, using the distal internal carotid diameter as the denominator. CONTRAST:  28mL ISOVUE-370 IOPAMIDOL (ISOVUE-370) INJECTION 76% COMPARISON:  CT head earlier today. FINDINGS: CTA NECK FINDINGS Aortic arch: Standard branching. Imaged portion shows no evidence of aneurysm or dissection. No significant stenosis of the major arch vessel origins. Right carotid system: Heavily calcified plaque. Intraluminal soft plaque/thrombus, occlusive, 1 cm above the origin of the RIGHT internal carotid artery, no measurable lumen. Stenosis estimated at 95%, possibly greater. Good opacification of the RIGHT ICA distally, although unclear how antegrade flow is accomplished. No visible dissection. Left carotid system: No evidence of dissection, stenosis (50% or greater) or occlusion. Heavily calcified plaque is accompanied by moderate nonstenotic posterior wall soft plaque. No ulceration or dissection. Vertebral arteries:  Both patent, dominant LEFT. Skeleton: No worrisome osseous lesion. Advanced spondylosis C5-C6, with osseous spurring. Degenerative anterolisthesis C3-4 and C4-5. Dental amalgam. No worrisome osseous lesion. Other neck: No concerning neck masses. Upper chest: Biapical scarring.  COPD. Review of the MIP images confirms the above findings CTA HEAD FINDINGS Anterior circulation: Mild calcific plaque both carotid siphons. ICA termini widely patent. No ACA or MCA flow-limiting stenosis. No MCA branch occlusion. No distal embolus on the RIGHT. Posterior circulation: BILATERAL fetal PCA. Basilar hypoplasia. LEFT vertebral dominant. RIGHT vertebral supplies only PICA. Venous sinuses: As permitted by contrast timing, patent. Anatomic variants: None of significance. Delayed phase: Not performed. Review of the MIP images confirms the above findings IMPRESSION: Occlusive soft plaque/thrombus, proximal RIGHT ICA. No measurable lumen. Good opacification of the RIGHT ICA more distally, although unclear how antegrade flow is accomplished. No distal emboli in the RIGHT anterior intracranial circulation. Nonstenotic plaque LEFT carotid bifurcation. These results were called by telephone at the time of interpretation on 03/11/2018 at 1:35 pm to Dr. Samara Snide , who verbally acknowledged these results. Electronically Signed   By: Staci Righter M.D.   On: 03/11/2018 13:48   Ct Head Wo Contrast  Result Date: 03/11/2018 CLINICAL DATA:  67 year old female with headache, nausea, vomiting and dizziness EXAM: CT HEAD WITHOUT CONTRAST TECHNIQUE: Contiguous axial images were obtained from the base of the skull through the vertex without intravenous contrast. COMPARISON:  None. FINDINGS: Brain: No evidence of acute infarction, hemorrhage, hydrocephalus, extra-axial collection or mass lesion/mass effect. Small circumscribed hypoechoic focus in the left caudate head which may represent a subacute or remote lacunar infarct. Vascular: No  hyperdense vessel or unexpected calcification. Skull: Normal. Negative for fracture or focal lesion. Sinuses/Orbits: No acute finding. Other: None. IMPRESSION: 1. Small circumscribed hypoechoic focus in the left caudate head which may represent a late subacute or remote lacunar infarct. 2. Otherwise, no acute intracranial abnormality. Electronically Signed   By: Jacqulynn Cadet M.D.   On: 03/11/2018 12:36   Ct Angio Neck W Or Wo Contrast  Result Date: 03/11/2018 CLINICAL DATA:  Headache, gait difficulty.  Imbalance. EXAM: CT ANGIOGRAPHY HEAD AND NECK TECHNIQUE:  Multidetector CT imaging of the head and neck was performed using the standard protocol during bolus administration of intravenous contrast. Multiplanar CT image reconstructions and MIPs were obtained to evaluate the vascular anatomy. Carotid stenosis measurements (when applicable) are obtained utilizing NASCET criteria, using the distal internal carotid diameter as the denominator. CONTRAST:  71mL ISOVUE-370 IOPAMIDOL (ISOVUE-370) INJECTION 76% COMPARISON:  CT head earlier today. FINDINGS: CTA NECK FINDINGS Aortic arch: Standard branching. Imaged portion shows no evidence of aneurysm or dissection. No significant stenosis of the major arch vessel origins. Right carotid system: Heavily calcified plaque. Intraluminal soft plaque/thrombus, occlusive, 1 cm above the origin of the RIGHT internal carotid artery, no measurable lumen. Stenosis estimated at 95%, possibly greater. Good opacification of the RIGHT ICA distally, although unclear how antegrade flow is accomplished. No visible dissection. Left carotid system: No evidence of dissection, stenosis (50% or greater) or occlusion. Heavily calcified plaque is accompanied by moderate nonstenotic posterior wall soft plaque. No ulceration or dissection. Vertebral arteries: Both patent, dominant LEFT. Skeleton: No worrisome osseous lesion. Advanced spondylosis C5-C6, with osseous spurring. Degenerative  anterolisthesis C3-4 and C4-5. Dental amalgam. No worrisome osseous lesion. Other neck: No concerning neck masses. Upper chest: Biapical scarring.  COPD. Review of the MIP images confirms the above findings CTA HEAD FINDINGS Anterior circulation: Mild calcific plaque both carotid siphons. ICA termini widely patent. No ACA or MCA flow-limiting stenosis. No MCA branch occlusion. No distal embolus on the RIGHT. Posterior circulation: BILATERAL fetal PCA. Basilar hypoplasia. LEFT vertebral dominant. RIGHT vertebral supplies only PICA. Venous sinuses: As permitted by contrast timing, patent. Anatomic variants: None of significance. Delayed phase: Not performed. Review of the MIP images confirms the above findings IMPRESSION: Occlusive soft plaque/thrombus, proximal RIGHT ICA. No measurable lumen. Good opacification of the RIGHT ICA more distally, although unclear how antegrade flow is accomplished. No distal emboli in the RIGHT anterior intracranial circulation. Nonstenotic plaque LEFT carotid bifurcation. These results were called by telephone at the time of interpretation on 03/11/2018 at 1:35 pm to Dr. Samara Snide , who verbally acknowledged these results. Electronically Signed   By: Staci Righter M.D.   On: 03/11/2018 13:48    Procedures Procedures (including critical care time)  CRITICAL CARE Performed by: Gwenyth Allegra Tegeler Total critical care time: 45 minutes Critical care time was exclusive of separately billable procedures and treating other patients. Code stroke getting TPA Critical care was necessary to treat or prevent imminent or life-threatening deterioration. Critical care was time spent personally by me on the following activities: development of treatment plan with patient and/or surrogate as well as nursing, discussions with consultants, evaluation of patient's response to treatment, examination of patient, obtaining history from patient or surrogate, ordering and performing treatments  and interventions, ordering and review of laboratory studies, ordering and review of radiographic studies, pulse oximetry and re-evaluation of patient's condition.   Medications Ordered in ED Medications  acetaminophen (TYLENOL) tablet 650 mg (has no administration in time range)   stroke: mapping our early stages of recovery book (has no administration in time range)  0.9 %  sodium chloride infusion ( Intravenous New Bag/Given 03/11/18 1448)  pantoprazole (PROTONIX) EC tablet 40 mg (has no administration in time range)  iopamidol (ISOVUE-370) 76 % injection (has no administration in time range)  Influenza vac split quadrivalent PF (FLUZONE HIGH-DOSE) injection 0.5 mL (has no administration in time range)  alteplase (ACTIVASE) 1 mg/mL infusion 58.9 mg (0 mg/kg  65.4 kg Intravenous Stopped 03/11/18 1348)    Followed  by  0.9 %  sodium chloride infusion (50 mLs Intravenous New Bag/Given 03/11/18 1345)  acetaminophen (TYLENOL) tablet 650 mg (650 mg Oral Given 03/11/18 1341)  iopamidol (ISOVUE-370) 76 % injection 50 mL (50 mLs Intravenous Contrast Given 03/11/18 1320)     Initial Impression / Assessment and Plan / ED Course  I have reviewed the triage vital signs and the nursing notes.  Pertinent labs & imaging results that were available during my care of the patient were reviewed by me and considered in my medical decision making (see chart for details).     Joy Williams is a 67 y.o. female with a past medical history significant for scleroderma, COPD, mitral prolapse and regurg status post bioprosthetic valve replacement in 2018, CHF, cold agglutinin disease, lung cancer, and hypothyroidism who presents with unsteadiness, left hand coronation problem, and numbness.  Patient reports that she was in normal health until 9:45 AM when she got in the shower.  She reports when she got out of the shower, she could not stand well and was leaning to the left.  She reports that her coordination fell  off.  She did not report full dizziness like vertigo she has had in the past but says she just feels unsteady.  She is right-handed.  She denied any difficulty with speech or vision changes.  She reports onset of headache that she describes an 8 out of 10 in severity during her shower.  She reports headache is persistent.  Patient was taken to CT scanner as ordered by nursing prior to my initial evaluation.  When I saw the patient on her way back from the CT scanner, I evaluated the patient.  On my exam, patient was found to have ataxia with the left hand and numbness in the left arm.  The rest of the exam was grossly unremarkable aside from telangictasias of the face.  Patient does have a murmur on exam.  Lungs were clear and abdomen was nontender.  Patient had normal sensation and strength in her legs.  No facial droop.  Pupils are symmetric and reactive with normal movements.  Due to onset of symptoms at 945 and it being just after noon on my initial evaluation, patient was quickly made a code stroke.   Neurology came to the bedside to continue work-up.  Neurology saw the patient and agreed that she likely is having stroke.  Patient will get TPA and be admitted to neuro ICU.  Patient admitted to neuro ICU in stable condition.  Reports her headache is improved after TPA.   Final Clinical Impressions(s) / ED Diagnoses   Final diagnoses:  Ataxia of left upper extremity  Arm numbness left     Clinical Impression: 1. Ataxia of left upper extremity   2. Arm numbness left     Disposition: Admit  This note was prepared with assistance of Dragon voice recognition software. Occasional wrong-word or sound-a-like substitutions may have occurred due to the inherent limitations of voice recognition software.           Tegeler, Gwenyth Allegra, MD 03/11/18 1455

## 2018-03-11 NOTE — Progress Notes (Signed)
SLP Cancellation Note  Patient Details Name: Joy Williams MRN: [id number] DOB: 03/20/1951   Cancelled treatment:       Reason Eval/Treat Not Completed: Patient not medically ready. Not ready to sit up, significant HA. Will f/u for cognitive assessment tomorrow.    Glennis Montenegro, Katherene Ponto 03/11/2018, 4:09 PM

## 2018-03-11 NOTE — H&P (Addendum)
Chief Complaint: Left side weakness, imbalance, dizziness and headache  History obtained from: Patient and Chart HPI:                                                                                                                                       Joy Williams is an 67 y.o. female past medical history of lung cancer status post resection and chemotherapy, mitral valve regurgitation status post mitral valve replacement, Raynaud's disease, telangiectasia, COPD, chronic diastolic heart failure since to the emergency room after developing sudden onset vertigo, headache and gait imbalance after getting out from the shower.  Last known normal was around 9:45 AM.  Patient was getting out of the shower she suddenly noticed she was extremely dizzy, had a headache, was unable to walk due to gait imbalance and weakness on the left side.  She was not brought in as a stroke alert however stroke alerted by EDP.  A CT scan that was obtained was negative for any hemorrhage. 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  NIHSS was low at 4 (1 sensory, 2 for ataxia and 1 for left upper extremity drift) but disabling.  After discussion of risk versus benefit of TPA, patient opted to receive IV TPA. CT angiogram was obtained shortly after which showed a near occlusive soft plaque thrombus in the proximal right ICA with good antegrade flow distally.   Date last known well: 10.23.19 Time last known well: 9.45 AM tPA Given: Yes NIHSS: 4 Baseline MRS 0    Past Medical History:  Diagnosis Date  . Allergy   . Asthma    pt denies this, but is on Dulera  . Chronic diastolic congestive heart failure ([place])   . 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 COPD (chronic obstructive pulmonary disease) ([place])   . Family history of adverse reaction to anesthesia     Mother- very sensitive to medication  . Heart murmur    MVP  . Hypothyroidism   . 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 MVP (mitral valve prolapse)   . PONV (postoperative nausea and vomiting)   . Raynaud's disease    Raynaud's  . Raynaud's syndrome   . 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 Severe mitral regurgitation 11/15/2014  . Shortness of breath dyspnea    with exertion  . STD (sexually transmitted disease)   . Telangiectasia   . Thyroid disease     Past Surgical History:  Procedure  Laterality Date  . BACK SURGERY     x 3  Disectomy  . CARDIAC CATHETERIZATION N/A 03/08/2016   Procedure: Right/Left Heart Cath and Coronary Angiography;  Surgeon: Sherren Mocha, MD;  Location: [place] [facility];  Service: Cardiovascular;  Laterality: N/A;  . CLAVICLE SURGERY Left 2013   plate to left collar bone  . LAPAROSCOPY     ? reason-age 38   . LUNG CANCER SURGERY    . MITRAL VALVE REPAIR Right 06/27/2016   Procedure: MINIMALLY INVASIVE MITRAL VALVE REPLACEMENT;  Surgeon: Rexene Alberts, MD;  Location: [place];  Service: Open Heart Surgery;  Laterality: Right;  . TEE WITHOUT CARDIOVERSION N/A 02/22/2016   Procedure: TRANSESOPHAGEAL ECHOCARDIOGRAM (TEE);  Surgeon: Dorothy Spark, MD;  Location: [place];  Service: Cardiovascular;  Laterality: N/A;  . TEE WITHOUT CARDIOVERSION N/A 06/27/2016   Procedure: TRANSESOPHAGEAL ECHOCARDIOGRAM (TEE);  Surgeon: Rexene Alberts, MD;  Location: [place];  Service: Open Heart Surgery;  Laterality: N/A;  . TONSILLECTOMY    . VIDEO ASSISTED THORACOSCOPY (VATS)/WEDGE RESECTION Left 12/30/2014   Bronchoscopy, Mediastinoscopy, Left VATS for Wedge resection LUL x2 adn  LLL x1 - Dr. Elenor Quinones at [facility]  . VOCAL CORD INJECTION Left 2017   injected with botox  . wrist surgery Left 2015   plate to wrist     Family History  Problem Relation Age of Onset  . Mitral valve prolapse Mother   . Dementia Father   . Prostate cancer Father   . Mitral valve prolapse Brother   . Mitral valve prolapse Sister   . Colon cancer Neg Hx   . Colon polyps Neg Hx   . Rectal cancer Neg Hx   . Esophageal cancer Neg Hx   . Stomach cancer Neg Hx   . Heart attack Neg Hx    Social History:  reports that she has been smoking e-cigarettes and cigarettes. She has a 6.00 pack-year smoking history. She has never used smokeless tobacco. She reports that she drinks about 2.0 standard drinks of alcohol per week. She reports that she does not use drugs.  Allergies:  Allergies  Allergen Reactions  . Penicillins Hives    Unknown, occurred as a child Has patient had a PCN reaction causing immediate rash, facial/tongue/throat swelling, SOB or lightheadedness with hypotension: Yes Has patient had a PCN reaction causing severe rash involving mucus membranes or skin necrosis: No Has patient had a PCN reaction that required hospitalization  Has patient had a PCN reaction occurring within the last 10 years: No If all of the above answers are "NO", then may proceed with Cephalosporin use. No    Medications:                                                                                                                        I reviewed home medications.  Aspirin at home  ROS:  14 systems reviewed and negative except above.  No history of any GI bleeding, recent surgery, chest pain or shortness of breath.   Examination:                                                                                                      General: Appears well-developed  Psych:  Affect appropriate to situation Eyes: No scleral injection HENT: No OP obstrucion Head: Normocephalic.  Cardiovascular: Normal rate and regular rhythm.  Respiratory: Effort normal and breath sounds normal to anterior ascultation GI: Soft.  No distension. There is no tenderness.  Skin: WDI    Neurological Examination Mental Status: Alert, oriented, thought content appropriate.  Speech fluent without evidence of aphasia. Able to follow 3 step commands without difficulty. Cranial Nerves: II: Visual fields grossly normal,  III,IV, VI: ptosis not present, extra-ocular motions intact bilaterally, pupils equal, round, reactive to light and accommodation V,VII: smile symmetric, facial light touch sensation normal bilaterally VIII: hearing normal bilaterally IX,X: uvula rises symmetrically XI: bilateral shoulder shrug XII: midline tongue extension Motor: Right : Upper extremity   5/5    Left:     Upper extremity   4+/5  Lower extremity   5/5     Lower extremity   5/5 Tone and bulk:normal tone throughout; no atrophy noted Sensory: reduced to light touch over left arm and leg Deep Tendon Reflexes: 2+ and symmetric throughout Plantars: Right: downgoing   Left: downgoing Cerebellar: Ataxia noted on been getting nose and heel-to-shin on the left side Gait: did not assess,      Lab Results: Basic Metabolic Panel: Recent Labs  Lab 03/11/18 1230 03/11/18 1234  NA 137 135  K 4.1 4.3  CL 98 99  CO2 23  --   GLUCOSE 90 93  BUN 14 16  CREATININE 0.73 0.70  CALCIUM 9.6  --     CBC: Recent Labs  Lab 03/11/18 1230 03/11/18 1234  WBC 9.0  --   NEUTROABS 7.4  --   HGB 13.0 14.3  HCT 42.7 42.0  MCV 88.6  --   PLT 159  --     Coagulation Studies: Recent Labs    03/11/18 1230  LABPROT 13.1  INR 1.00    Imaging: Ct Angio Head W Or Wo Contrast  Result Date: 03/11/2018 CLINICAL DATA:  Headache, gait difficulty.  Imbalance. EXAM: CT ANGIOGRAPHY HEAD AND NECK TECHNIQUE:  Multidetector CT imaging of the head and neck was performed using the standard protocol during bolus administration of intravenous contrast. Multiplanar CT image reconstructions and MIPs were obtained to evaluate the vascular anatomy. Carotid stenosis measurements (when applicable) are obtained utilizing NASCET criteria, using the distal internal carotid diameter as the denominator. CONTRAST:  73mL ISOVUE-370 IOPAMIDOL (ISOVUE-370) INJECTION 76% COMPARISON:  CT head earlier today. FINDINGS: CTA NECK FINDINGS Aortic arch: Standard branching. Imaged portion shows no evidence of aneurysm or dissection. No significant stenosis of the major arch vessel origins. Right carotid system: Heavily calcified plaque. Intraluminal soft plaque/thrombus, occlusive, 1 cm above the origin of the RIGHT internal carotid artery, no measurable  lumen. Stenosis estimated at 95%, possibly greater. Good opacification of the RIGHT ICA distally, although unclear how antegrade flow is accomplished. No visible dissection. Left carotid system: No evidence of dissection, stenosis (50% or greater) or occlusion. Heavily calcified plaque is accompanied by moderate nonstenotic posterior wall soft plaque. No ulceration or dissection. Vertebral arteries: Both patent, dominant LEFT. Skeleton: No worrisome osseous lesion. Advanced spondylosis C5-C6, with osseous spurring. Degenerative anterolisthesis C3-4 and C4-5. Dental amalgam. No worrisome osseous lesion. Other neck: No concerning neck masses. Upper chest: Biapical scarring.  COPD. Review of the MIP images confirms the above findings CTA HEAD FINDINGS Anterior circulation: Mild calcific plaque both carotid siphons. ICA termini widely patent. No ACA or MCA flow-limiting stenosis. No MCA branch occlusion. No distal embolus on the RIGHT. Posterior circulation: BILATERAL fetal PCA. Basilar hypoplasia. LEFT vertebral dominant. RIGHT vertebral supplies only PICA. Venous sinuses: As permitted by contrast  timing, patent. Anatomic variants: None of significance. Delayed phase: Not performed. Review of the MIP images confirms the above findings IMPRESSION: Occlusive soft plaque/thrombus, proximal RIGHT ICA. No measurable lumen. Good opacification of the RIGHT ICA more distally, although unclear how antegrade flow is accomplished. No distal emboli in the RIGHT anterior intracranial circulation. Nonstenotic plaque LEFT carotid bifurcation. These results were called by telephone at the time of interpretation on 03/11/2018 at 1:35 pm to Dr. Samara Snide , who verbally acknowledged these results. Electronically Signed   By: Staci Righter M.D.   On: 03/11/2018 13:48   Ct Head Wo Contrast  Result Date: 03/11/2018 CLINICAL DATA:  67 year old female with headache, nausea, vomiting and dizziness EXAM: CT HEAD WITHOUT CONTRAST TECHNIQUE: Contiguous axial images were obtained from the base of the skull through the vertex without intravenous contrast. COMPARISON:  None. FINDINGS: Brain: No evidence of acute infarction, hemorrhage, hydrocephalus, extra-axial collection or mass lesion/mass effect. Small circumscribed hypoechoic focus in the left caudate head which may represent a subacute or remote lacunar infarct. Vascular: No hyperdense vessel or unexpected calcification. Skull: Normal. Negative for fracture or focal lesion. Sinuses/Orbits: No acute finding. Other: None. IMPRESSION: 1. Small circumscribed hypoechoic focus in the left caudate head which may represent a late subacute or remote lacunar infarct. 2. Otherwise, no acute intracranial abnormality. Electronically Signed   By: Jacqulynn Cadet M.D.   On: 03/11/2018 12:36   Ct Angio Neck W Or Wo Contrast  Result Date: 03/11/2018 CLINICAL DATA:  Headache, gait difficulty.  Imbalance. EXAM: CT ANGIOGRAPHY HEAD AND NECK TECHNIQUE: Multidetector CT imaging of the head and neck was performed using the standard protocol during bolus administration of intravenous  contrast. Multiplanar CT image reconstructions and MIPs were obtained to evaluate the vascular anatomy. Carotid stenosis measurements (when applicable) are obtained utilizing NASCET criteria, using the distal internal carotid diameter as the denominator. CONTRAST:  7mL ISOVUE-370 IOPAMIDOL (ISOVUE-370) INJECTION 76% COMPARISON:  CT head earlier today. FINDINGS: CTA NECK FINDINGS Aortic arch: Standard branching. Imaged portion shows no evidence of aneurysm or dissection. No significant stenosis of the major arch vessel origins. Right carotid system: Heavily calcified plaque. Intraluminal soft plaque/thrombus, occlusive, 1 cm above the origin of the RIGHT internal carotid artery, no measurable lumen. Stenosis estimated at 95%, possibly greater. Good opacification of the RIGHT ICA distally, although unclear how antegrade flow is accomplished. No visible dissection. Left carotid system: No evidence of dissection, stenosis (50% or greater) or occlusion. Heavily calcified plaque is accompanied by moderate nonstenotic posterior wall soft plaque. No ulceration or dissection. Vertebral arteries: Both patent, dominant LEFT. Skeleton:  No worrisome osseous lesion. Advanced spondylosis C5-C6, with osseous spurring. Degenerative anterolisthesis C3-4 and C4-5. Dental amalgam. No worrisome osseous lesion. Other neck: No concerning neck masses. Upper chest: Biapical scarring.  COPD. Review of the MIP images confirms the above findings CTA HEAD FINDINGS Anterior circulation: Mild calcific plaque both carotid siphons. ICA termini widely patent. No ACA or MCA flow-limiting stenosis. No MCA branch occlusion. No distal embolus on the RIGHT. Posterior circulation: BILATERAL fetal PCA. Basilar hypoplasia. LEFT vertebral dominant. RIGHT vertebral supplies only PICA. Venous sinuses: As permitted by contrast timing, patent. Anatomic variants: None of significance. Delayed phase: Not performed. Review of the MIP images confirms the above  findings IMPRESSION: Occlusive soft plaque/thrombus, proximal RIGHT ICA. No measurable lumen. Good opacification of the RIGHT ICA more distally, although unclear how antegrade flow is accomplished. No distal emboli in the RIGHT anterior intracranial circulation. Nonstenotic plaque LEFT carotid bifurcation. These results were called by telephone at the time of interpretation on 03/11/2018 at 1:35 pm to Dr. Samara Snide , who verbally acknowledged these results. Electronically Signed   By: Staci Righter M.D.   On: 03/11/2018 13:48     ASSESSMENT AND PLAN   67 y.o. female past medical history of lung cancer status post resection and chemotherapy, mitral valve regurgitation status post mitral valve replacement, Raynaud's disease, telangiectasia, COPD, chronic diastolic heart failure with left-sided ataxia, headache, dizziness and sensory symptoms.  She received IV TPA.  CT head shows near occlusion of right carotid.  She will need urgent neuro- interventional or vascular surgery consult.   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   You can look them up on [email or web address]  Password [email or web address]     This patient  is neurologically critically ill due to stroke s/p TPA.  She is at risk for significant risk of neurological worsening from cerebral edema,  death from brain herniation, heart failure, hemorrhagic conversion, infection, respiratory failure and seizure. This patient's care requires constant monitoring of vital signs, hemodynamics, respiratory and cardiac monitoring, review of multiple databases, neurological assessment, discussion with family, other specialists and medical decision making of high complexity.  I spent 70 minutes of neurocritical time in the care of this patient.     Danylah Holden [facility] Pager Number [phone]

## 2018-03-11 NOTE — Code Documentation (Addendum)
67 yo Female coming from home with complaints of left side weakness and dizziness that started at 0945 this morning while patient was trying to get into the shower. Pt reports waking up with no symptoms. Pt was in the shower when she noticed a sudden onset of headache, dizziness, and left arm and leg weakness. Pt called daughter and daughter called 60. EMS transported patient. Patient arrived to the ED and was evaluated by RN. RN took patient to CT per ED Triage orders. When MD arrived, MD activated a Code Stroke. Stroke Team met patient in her room. Upon intial assessment, pt had NIHSS 3 due to ataxia in both left arm and left leg along with sensory decreased. Pt alert and oriented x4. Complains of a 8/10 headache. Neurology at the bedside and spoke with patient about the risks and benefits of tPA. Pt agreed to receive tPA. Due to headache, neurology waited to speak with raidology about patient's CT results. No hemorrhage noted on CT. IV Team at the bedside obtaining two IVs for tPA. Pt was a hard stick. Neurology ordered for Pharmacy to mix tPA at 1240. tPA started at 1246. Pt taken for CTA. Pt admitted to [place]. Report given to Wade and patient transported upstairs.

## 2018-03-11 NOTE — ED Notes (Signed)
This RN attempted IV x 2 without success.  Pt with a hx of difficult stick.  Will put in for IV team

## 2018-03-11 NOTE — ED Triage Notes (Signed)
Pt arrives via EMS from home with 9/10 HA and gait abnormality. LKW 0945. Pt states she got in the shower at 0945 and was fine, when she got out of the shower she was leaning to the left and severe headache. Pt with hx mitral valve replacement and thyroid issues. Pt with emesis X2 with EMS and given 4mg  IM zofran en route. 148/90, HR 90, RR 18, 100% RA.

## 2018-03-12 ENCOUNTER — Other Ambulatory Visit: Payer: Self-pay

## 2018-03-12 ENCOUNTER — Inpatient Hospital Stay (HOSPITAL_COMMUNITY): Payer: Medicare Other

## 2018-03-12 ENCOUNTER — Telehealth: Payer: Self-pay | Admitting: Family Medicine

## 2018-03-12 DIAGNOSIS — Z716 Tobacco abuse counseling: Secondary | ICD-10-CM

## 2018-03-12 DIAGNOSIS — Z9289 Personal history of other medical treatment: Secondary | ICD-10-CM

## 2018-03-12 DIAGNOSIS — R200 Anesthesia of skin: Secondary | ICD-10-CM

## 2018-03-12 DIAGNOSIS — G43109 Migraine with aura, not intractable, without status migrainosus: Secondary | ICD-10-CM | POA: Diagnosis present

## 2018-03-12 DIAGNOSIS — I639 Cerebral infarction, unspecified: Secondary | ICD-10-CM

## 2018-03-12 DIAGNOSIS — E785 Hyperlipidemia, unspecified: Secondary | ICD-10-CM

## 2018-03-12 DIAGNOSIS — I5030 Unspecified diastolic (congestive) heart failure: Secondary | ICD-10-CM

## 2018-03-12 DIAGNOSIS — C3490 Malignant neoplasm of unspecified part of unspecified bronchus or lung: Secondary | ICD-10-CM

## 2018-03-12 DIAGNOSIS — F17210 Nicotine dependence, cigarettes, uncomplicated: Secondary | ICD-10-CM

## 2018-03-12 LAB — LIPID PANEL
CHOL/HDL RATIO: 3.5 ratio
Cholesterol: 170 mg/dL (ref 0–200)
HDL: 48 mg/dL (ref >40)
LDL Cholesterol: 108 mg/dL — ABNORMAL HIGH (ref 0–99)
Triglycerides: 68 mg/dL (ref <150)
VLDL: 14 mg/dL (ref 0–40)

## 2018-03-12 LAB — ECHOCARDIOGRAM COMPLETE
Height: 66.5 in
Weight: 2204.6 oz

## 2018-03-12 LAB — HIV ANTIBODY (ROUTINE TESTING W REFLEX)
HIV SCREEN 4TH GENERATION: NONREACTIVE
HIV SCREEN 4TH GENERATION: NONREACTIVE

## 2018-03-12 LAB — HEMOGLOBIN A1C
HEMOGLOBIN A1C: 5.3 % (ref 4.8–5.6)
MEAN PLASMA GLUCOSE: 105.41 mg/dL

## 2018-03-12 MED ORDER — LEVOTHYROXINE SODIUM 75 MCG PO TABS: 175.0000 ug | ORAL_TABLET | Freq: Every day | ORAL | Status: DC

## 2018-03-12 MED ORDER — MOMETASONE FURO-FORMOTEROL FUM 100-5 MCG/ACT IN AERO
2.0000 | INHALATION_SPRAY | Freq: Two times a day (BID) | RESPIRATORY_TRACT | Status: DC
  Filled 2018-03-12: qty 8.8

## 2018-03-12 MED ORDER — ATORVASTATIN CALCIUM 20 MG PO TABS: 20.0000 mg | ORAL_TABLET | Freq: Every day | ORAL | 2 refills | Status: DC

## 2018-03-12 MED ORDER — ASPIRIN EC 81 MG PO TBEC: 81.0000 mg | DELAYED_RELEASE_TABLET | Freq: Every day | ORAL | Status: AC

## 2018-03-12 MED ORDER — ATORVASTATIN CALCIUM 20 MG PO TABS: 20.0000 mg | ORAL_TABLET | Freq: Every day | ORAL | Status: DC

## 2018-03-12 MED ORDER — CLOPIDOGREL BISULFATE 75 MG PO TABS: 75.0000 mg | ORAL_TABLET | Freq: Every day | ORAL | Status: DC

## 2018-03-12 MED ORDER — CLOPIDOGREL BISULFATE 75 MG PO TABS: 75.0000 mg | ORAL_TABLET | Freq: Every day | ORAL | 2 refills | Status: DC

## 2018-03-12 MED ORDER — ASPIRIN EC 81 MG PO TBEC: 81.0000 mg | DELAYED_RELEASE_TABLET | Freq: Every day | ORAL | Status: DC

## 2018-03-12 NOTE — Discharge Summary (Addendum)
Stroke Discharge Summary  Patient ID: Joy Williams   MRN: [id number]      DOB: 1951/01/26  Date of Admission: 03/11/2018 Date of Discharge: 03/12/2018  Attending Physician:  Aroor, Lanice Schwab, MD, Stroke MD Consultant(s):    ID (phone consult [name], MD) Patient's PCP:  Mellody Dance, DO  DISCHARGE DIAGNOSIS:  Principal Problem:   Stroke-like episode [facility]) s/p tPA Active Problems:   Asthmatic bronchitis , chronic ([place])   Current Cigarette smoker   Chronic diastolic congestive heart failure ([place])   Chronic obstructive pulmonary disease ([place])   Hypothyroidism   Tobacco abuse counseling   Raynaud's disease   Lung cancer ([place])   Complicated migraine   Hyperlipidemia LDL goal <70   History of RPR test, 9/18 felt to be false positive   Past Medical History:  Diagnosis Date  . Allergy   . Asthma    pt denies this, but is on Dulera  . Chronic diastolic congestive heart failure ([place])   . 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 COPD (chronic obstructive pulmonary disease) ([place])   . Family history of adverse reaction to anesthesia    Mother- very sensitive to medication  . Heart murmur    MVP  . Hypothyroidism   . 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 MVP (mitral valve prolapse)   . PONV (postoperative nausea and vomiting)   . Raynaud's disease    Raynaud's  . Raynaud's syndrome   . 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 mitral bovine bioprosthetic tissue valve placed via right mini thoracotomy approach  . Severe mitral regurgitation 11/15/2014  . Shortness of breath dyspnea    with exertion  . STD (sexually transmitted disease)   . Telangiectasia    . Thyroid disease    Past Surgical History:  Procedure Laterality Date  . BACK SURGERY     x 3  Disectomy  . CARDIAC CATHETERIZATION N/A 03/08/2016   Procedure: Right/Left Heart Cath and Coronary Angiography;  Surgeon: Sherren Mocha, MD;  Location: [place] [facility];  Service: Cardiovascular;  Laterality: N/A;  . CLAVICLE SURGERY Left 2013   plate to left collar bone  . LAPAROSCOPY     ? reason-age 5   . LUNG CANCER SURGERY    . MITRAL VALVE REPAIR Right 06/27/2016   Procedure: MINIMALLY INVASIVE MITRAL VALVE REPLACEMENT;  Surgeon: Rexene Alberts, MD;  Location: [place];  Service: Open Heart Surgery;  Laterality: Right;  . TEE WITHOUT CARDIOVERSION N/A 02/22/2016   Procedure: TRANSESOPHAGEAL ECHOCARDIOGRAM (TEE);  Surgeon: Dorothy Spark, MD;  Location: [place];  Service: Cardiovascular;  Laterality: N/A;  . TEE WITHOUT CARDIOVERSION N/A 06/27/2016   Procedure: TRANSESOPHAGEAL ECHOCARDIOGRAM (TEE);  Surgeon: Rexene Alberts, MD;  Location: [place];  Service: Open Heart Surgery;  Laterality: N/A;  . TONSILLECTOMY    . VIDEO ASSISTED THORACOSCOPY (VATS)/WEDGE RESECTION Left 12/30/2014   Bronchoscopy, Mediastinoscopy, Left VATS for Wedge resection LUL x2 adn LLL x1 - Dr. Elenor Quinones at [facility]  . VOCAL CORD INJECTION Left 2017   injected with botox  . wrist surgery Left 2015   plate to wrist     Allergies  as of 03/12/2018      Reactions   Penicillins Hives   Unknown, occurred as a child Has patient had a PCN reaction causing immediate rash, facial/tongue/throat swelling, SOB or lightheadedness with hypotension: Yes Has patient had a PCN reaction causing severe rash involving mucus membranes or skin necrosis: No Has patient had a PCN reaction that required hospitalization  Has patient had a PCN reaction occurring within the last 10 years: No If all of the above answers are "NO", then may proceed with Cephalosporin use. No      Medication List    STOP taking these medications    nitrofurantoin (macrocrystal-monohydrate) 100 MG capsule Commonly known as:  MACROBID     TAKE these medications   acetaminophen 500 MG tablet Commonly known as:  TYLENOL Take 1,000 mg by mouth every 6 (six) hours as needed for mild pain or headache.   aspirin EC 81 MG tablet Take 1 tablet (81 mg total) by mouth daily for 21 days. Stop after 21 days and continue plavix alone (be sure to continue aspirin if you decide not to take the plavix) What changed:  additional instructions   atorvastatin 20 MG tablet Commonly known as:  LIPITOR Take 1 tablet (20 mg total) by mouth daily at 6 PM.   budesonide-formoterol 80-4.5 MCG/ACT inhaler Commonly known as:  SYMBICORT Inhale 2 puffs into the lungs 2 (two) times daily.   clopidogrel 75 MG tablet Commonly known as:  PLAVIX Take 1 tablet (75 mg total) by mouth daily.   levothyroxine 175 MCG tablet Commonly known as:  SYNTHROID, LEVOTHROID TAKEK1 TABLET BY MOUTH DAILY**PATIENT NEEDS OFFICE VISIT FOR FURTHER REFILLS** What changed:  See the new instructions.   multivitamin with minerals tablet Take 1 tablet by mouth daily.   naproxen sodium 220 MG tablet Commonly known as:  ALEVE Take 220 mg by mouth as needed (pain).   Turmeric 500 MG Caps Take 500 mg by mouth daily.   vitamin C 500 MG tablet Commonly known as:  ASCORBIC ACID Take 500 mg by mouth daily.   Vitamin D3 5000 units Tabs 5,000 IU OTC vitamin D3 daily.       LABORATORY STUDIES CBC    Component Value Date/Time   WBC 9.0 03/11/2018 1230   RBC 4.82 03/11/2018 1230   HGB 14.3 03/11/2018 1234   HGB 12.2 01/14/2017 0849   HCT 42.0 03/11/2018 1234   HCT 37.6 01/14/2017 0849   PLT 159 03/11/2018 1230   PLT 182 01/14/2017 0849   MCV 88.6 03/11/2018 1230   MCV 87 01/14/2017 0849   MCH 27.0 03/11/2018 1230   MCHC 30.4 03/11/2018 1230   RDW 12.5 03/11/2018 1230   RDW 14.9 01/14/2017 0849   LYMPHSABS 0.9 03/11/2018 1230   LYMPHSABS 1.4 01/14/2017 0849    MONOABS 0.5 03/11/2018 1230   EOSABS 0.0 03/11/2018 1230   EOSABS 0.1 01/14/2017 0849   BASOSABS 0.0 03/11/2018 1230   BASOSABS 0.0 01/14/2017 0849   CMP    Component Value Date/Time   NA 135 03/11/2018 1234   NA 139 09/24/2016   K 4.3 03/11/2018 1234   CL 99 03/11/2018 1234   CO2 23 03/11/2018 1230   GLUCOSE 93 03/11/2018 1234   BUN 16 03/11/2018 1234   BUN 20 09/24/2016   CREATININE 0.70 03/11/2018 1234   CALCIUM 9.6 03/11/2018 1230   PROT 7.0 03/11/2018 1230   ALBUMIN 3.8 03/11/2018 1230   AST 29 03/11/2018 1230   ALT 17 03/11/2018 1230  ALKPHOS 56 03/11/2018 1230   BILITOT 0.5 03/11/2018 1230   GFRNONAA >60 03/11/2018 1230   GFRAA >60 03/11/2018 1230   COAGS Lab Results  Component Value Date   INR 1.00 03/11/2018   INR 1.6 10/23/2016   INR 2.7 10/08/2016   Lipid Panel    Component Value Date/Time   CHOL 170 03/12/2018 0405   CHOL 175 01/14/2017 0849   CHOL 177 10/28/2014 1025   TRIG 68 03/12/2018 0405   TRIG 115 10/28/2014 1025   HDL 48 03/12/2018 0405   HDL 59 01/14/2017 0849   HDL 70 10/28/2014 1025   CHOLHDL 3.5 03/12/2018 0405   VLDL 14 03/12/2018 0405   LDLCALC 108 (H) 03/12/2018 0405   LDLCALC 92 01/14/2017 0849   LDLCALC 84 10/28/2014 1025   HgbA1C  Lab Results  Component Value Date   HGBA1C 5.3 03/12/2018   Urinalysis    Component Value Date/Time   COLORURINE YELLOW 03/11/2018 1819   APPEARANCEUR CLEAR 03/11/2018 1819   LABSPEC 1.034 (H) 03/11/2018 1819   PHURINE 6.0 03/11/2018 1819   GLUCOSEU NEGATIVE 03/11/2018 1819   HGBUR NEGATIVE 03/11/2018 1819   BILIRUBINUR NEGATIVE 03/11/2018 1819   BILIRUBINUR negative 10/14/2017 1020   KETONESUR 5 (A) 03/11/2018 1819   PROTEINUR NEGATIVE 03/11/2018 1819   UROBILINOGEN 0.2 10/14/2017 1020   NITRITE NEGATIVE 03/11/2018 1819   LEUKOCYTESUR NEGATIVE 03/11/2018 1819   Urine Drug Screen     Component Value Date/Time   LABOPIA NONE DETECTED 03/11/2018 1818   COCAINSCRNUR NONE DETECTED  03/11/2018 1818   LABBENZ NONE DETECTED 03/11/2018 1818   AMPHETMU NONE DETECTED 03/11/2018 1818   THCU NONE DETECTED 03/11/2018 1818   LABBARB NONE DETECTED 03/11/2018 1818    Alcohol Level    Component Value Date/Time   ETH <10 03/11/2018 1230    SIGNIFICANT DIAGNOSTIC STUDIES Ct Angio Head W Or Wo Contrast  Result Date: 03/11/2018 CLINICAL DATA:  Headache, gait difficulty.  Imbalance. EXAM: CT ANGIOGRAPHY HEAD AND NECK TECHNIQUE: Multidetector CT imaging of the head and neck was performed using the standard protocol during bolus administration of intravenous contrast. Multiplanar CT image reconstructions and MIPs were obtained to evaluate the vascular anatomy. Carotid stenosis measurements (when applicable) are obtained utilizing NASCET criteria, using the distal internal carotid diameter as the denominator. CONTRAST:  24mL ISOVUE-370 IOPAMIDOL (ISOVUE-370) INJECTION 76% COMPARISON:  CT head earlier today. FINDINGS: CTA NECK FINDINGS Aortic arch: Standard branching. Imaged portion shows no evidence of aneurysm or dissection. No significant stenosis of the major arch vessel origins. Right carotid system: Heavily calcified plaque. Intraluminal soft plaque/thrombus, occlusive, 1 cm above the origin of the RIGHT internal carotid artery, no measurable lumen. Stenosis estimated at 95%, possibly greater. Good opacification of the RIGHT ICA distally, although unclear how antegrade flow is accomplished. No visible dissection. Left carotid system: No evidence of dissection, stenosis (50% or greater) or occlusion. Heavily calcified plaque is accompanied by moderate nonstenotic posterior wall soft plaque. No ulceration or dissection. Vertebral arteries: Both patent, dominant LEFT. Skeleton: No worrisome osseous lesion. Advanced spondylosis C5-C6, with osseous spurring. Degenerative anterolisthesis C3-4 and C4-5. Dental amalgam. No worrisome osseous lesion. Other neck: No concerning neck masses. Upper chest:  Biapical scarring.  COPD. Review of the MIP images confirms the above findings CTA HEAD FINDINGS Anterior circulation: Mild calcific plaque both carotid siphons. ICA termini widely patent. No ACA or MCA flow-limiting stenosis. No MCA branch occlusion. No distal embolus on the RIGHT. Posterior circulation: BILATERAL fetal PCA. Basilar hypoplasia. LEFT  vertebral dominant. RIGHT vertebral supplies only PICA. Venous sinuses: As permitted by contrast timing, patent. Anatomic variants: None of significance. Delayed phase: Not performed. Review of the MIP images confirms the above findings IMPRESSION: Occlusive soft plaque/thrombus, proximal RIGHT ICA. No measurable lumen. Good opacification of the RIGHT ICA more distally, although unclear how antegrade flow is accomplished. No distal emboli in the RIGHT anterior intracranial circulation. Nonstenotic plaque LEFT carotid bifurcation. These results were called by telephone at the time of interpretation on 03/11/2018 at 1:35 pm to Dr. Samara Snide , who verbally acknowledged these results. Electronically Signed   By: Staci Righter M.D.   On: 03/11/2018 13:48   Ct Head Wo Contrast  Result Date: 03/11/2018 CLINICAL DATA:  67 year old female with headache, nausea, vomiting and dizziness EXAM: CT HEAD WITHOUT CONTRAST TECHNIQUE: Contiguous axial images were obtained from the base of the skull through the vertex without intravenous contrast. COMPARISON:  None. FINDINGS: Brain: No evidence of acute infarction, hemorrhage, hydrocephalus, extra-axial collection or mass lesion/mass effect. Small circumscribed hypoechoic focus in the left caudate head which may represent a subacute or remote lacunar infarct. Vascular: No hyperdense vessel or unexpected calcification. Skull: Normal. Negative for fracture or focal lesion. Sinuses/Orbits: No acute finding. Other: None. IMPRESSION: 1. Small circumscribed hypoechoic focus in the left caudate head which may represent a late subacute  or remote lacunar infarct. 2. Otherwise, no acute intracranial abnormality. Electronically Signed   By: Jacqulynn Cadet M.D.   On: 03/11/2018 12:36   Ct Angio Neck W Or Wo Contrast  Result Date: 03/11/2018 CLINICAL DATA:  Headache, gait difficulty.  Imbalance. EXAM: CT ANGIOGRAPHY HEAD AND NECK TECHNIQUE: Multidetector CT imaging of the head and neck was performed using the standard protocol during bolus administration of intravenous contrast. Multiplanar CT image reconstructions and MIPs were obtained to evaluate the vascular anatomy. Carotid stenosis measurements (when applicable) are obtained utilizing NASCET criteria, using the distal internal carotid diameter as the denominator. CONTRAST:  5mL ISOVUE-370 IOPAMIDOL (ISOVUE-370) INJECTION 76% COMPARISON:  CT head earlier today. FINDINGS: CTA NECK FINDINGS Aortic arch: Standard branching. Imaged portion shows no evidence of aneurysm or dissection. No significant stenosis of the major arch vessel origins. Right carotid system: Heavily calcified plaque. Intraluminal soft plaque/thrombus, occlusive, 1 cm above the origin of the RIGHT internal carotid artery, no measurable lumen. Stenosis estimated at 95%, possibly greater. Good opacification of the RIGHT ICA distally, although unclear how antegrade flow is accomplished. No visible dissection. Left carotid system: No evidence of dissection, stenosis (50% or greater) or occlusion. Heavily calcified plaque is accompanied by moderate nonstenotic posterior wall soft plaque. No ulceration or dissection. Vertebral arteries: Both patent, dominant LEFT. Skeleton: No worrisome osseous lesion. Advanced spondylosis C5-C6, with osseous spurring. Degenerative anterolisthesis C3-4 and C4-5. Dental amalgam. No worrisome osseous lesion. Other neck: No concerning neck masses. Upper chest: Biapical scarring.  COPD. Review of the MIP images confirms the above findings CTA HEAD FINDINGS Anterior circulation: Mild calcific plaque  both carotid siphons. ICA termini widely patent. No ACA or MCA flow-limiting stenosis. No MCA branch occlusion. No distal embolus on the RIGHT. Posterior circulation: BILATERAL fetal PCA. Basilar hypoplasia. LEFT vertebral dominant. RIGHT vertebral supplies only PICA. Venous sinuses: As permitted by contrast timing, patent. Anatomic variants: None of significance. Delayed phase: Not performed. Review of the MIP images confirms the above findings IMPRESSION: Occlusive soft plaque/thrombus, proximal RIGHT ICA. No measurable lumen. Good opacification of the RIGHT ICA more distally, although unclear how antegrade flow is accomplished. No distal  emboli in the RIGHT anterior intracranial circulation. Nonstenotic plaque LEFT carotid bifurcation. These results were called by telephone at the time of interpretation on 03/11/2018 at 1:35 pm to Dr. Samara Snide , who verbally acknowledged these results. Electronically Signed   By: Staci Righter M.D.   On: 03/11/2018 13:48   Mr Brain Wo Contrast  Result Date: 03/12/2018 CLINICAL DATA:  Acute onset of vertigo, headache and gait disturbance. History of lung cancer. EXAM: MRI HEAD WITHOUT CONTRAST TECHNIQUE: Multiplanar, multiecho pulse sequences of the brain and surrounding structures were obtained without intravenous contrast. COMPARISON:  CT studies done yesterday. FINDINGS: Brain: Diffusion imaging does not show any acute or subacute infarction. The brainstem is normal. There are a few old small vessel cerebellar infarctions. Single old small vessel infarction left thalamus. Cerebral hemispheres appear otherwise normal without evidence of old small or large vessel infarction. No mass lesion, hemorrhage, hydrocephalus or extra-axial collection. Vascular: Major vessels at the base of the brain show flow. Skull and upper cervical spine: Negative Sinuses/Orbits: Clear/normal Other: None IMPRESSION: No acute intracranial finding. Mild chronic small-vessel ischemic change of  the cerebellum and left thalamus. Electronically Signed   By: Nelson Chimes M.D.   On: 03/12/2018 11:24    Carotid Doppler   There is 1-39% bilateral ICA stenosis. Vertebral artery flow is antegrade.   2D Echocardiogram  - Left ventricle: The cavity size was normal. Wall thickness wasincreased in a pattern of moderate to severe LVH. Systolicfunction was normal. The estimated ejection fraction was in therange of 55% to 60%. Wall motion was normal; there were noregional wall motion abnormalities. Features are consistent witha pseudonormal left ventricular filling pattern, with concomitantabnormal relaxation and increased filling pressure (grade 2diastolic dysfunction). - Aortic valve: There was no regurgitation. - Mitral valve: A prosthesis was present and functioning normally.The prosthesis had a normal range of motion. The sewing ringappeared normal, had no rocking motion, and showed no evidence ofdehiscence. The findings are consistent with trivial stenosis.Mean gradient (D): 2 mm Hg. Valve area by continuity equation(using LVOT flow): 2.62 cm^2. - Left atrium: The atrium was moderately to severely dilated. - Right ventricle: Systolic function was normal. - Atrial septum: No defect or patent foramen ovale was identified. - Tricuspid valve: There was trivial regurgitation. Impressions:  No cardiac source of emboli was indentified. Normal LV EF, grade2 diastolic dysfunction. Bioprosthetic mitral valve appears ingood posiion with a trivial gradient.    HISTORY OF PRESENT ILLNESS Joy Williams is an 67 y.o. female past medical history of lung cancer status post resection and chemotherapy, mitral valve regurgitation status post mitral valve replacement, Raynaud's disease, telangiectasia, COPD, chronic diastolic heart failure since to the emergency room after developing sudden onset vertigo, headache and gait imbalance after getting out from the shower.  Last known normal was around  9:45 AM 03/11/2018.  Patient was getting out of the shower she suddenly noticed she was extremely dizzy, had a headache, was unable to walk due to gait imbalance and weakness on the left side.  She was not brought in as a stroke alert however stroke alerted by EDP.  A CT scan that was obtained was negative for any hemorrhage. 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  NIHSS was low at 4 (1 sensory, 2 for ataxia and 1 for left upper extremity drift) but disabling.  After discussion of risk versus benefit of TPA, patient opted to receive IV TPA. CT angiogram was obtained shortly after which showed a  near occlusive soft plaque thrombus in the proximal right ICA with good antegrade flow distally. NIHSS: 4. Baseline MRS 0. She was admitted to the neuro ICU for further evaluation and treatment.   HOSPITAL COURSE [name] is a 67 y.o. 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 Received IV tPA 03/11/2018 at 1246.  Stroke-like symptoms s/p IV tPA Complicated Migraine   Nausea, vomiting, L sided sensory deficit resolved  CT head L caudate head hypoechoic focus    CTA head & neck R ICA plaque/thrombus w/ good flow distally. Nonstenotic plaque L ICA bifurcation.   MRI  No acute stroke  Carotid Doppler  B ICA 1-39% stenosis, VAs antegrade   2D Echo  EF 55-60%. No source of embolus   LDL 108  HgbA1c 5.3  aspirin 81 mg daily prior to admission, now on No antithrombotic as within 24h of tPA administration. No hemorrhage on post tPA imaging. Start aspirin 81 mg daily. Add plavix 75 x 3 weeks. Prefer plavix long-term without aspirin. Pt aware. She is hesitant to take plavix (does not like pills). Will prescribe plavix. 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 Pt sent to health dept for treatment. They felt the titer was a false positive d/t elevated HSV and that pt did not have syphilis nor need treatment. Unclear if they did additional testing. Repeat RPR ordered this am. Discussed with Dr. [name] who will look into (call health dept) and follow up if needed as an OP. There is reference to false negative syphilis testing in Dr. Hershal Coria Office Visit note from 02/20/2017 under the HPI. I spoke with her office who are working to get a copy of the Health Dept records as well.  eCigarette smoker, advised to stop smoking  ETOH use, advised to drink no more than 1 drink(s) a day  chronic diastolic Congestive heart failure  Other Active Problems  Hx lung cancer  COPD  MR replacement  Hypothyroidism  Raynaud's  Will d/c today.   DISCHARGE EXAM Blood pressure (!) 97/57, pulse 69, temperature 97.7 F (36.5 C), temperature source Oral, resp. rate 11, height 5' 6.5" (1.689 m), weight 62.5 kg, SpO2 100 %. General: Appears well-developed  Psych: Affect appropriate to situation Eyes: No scleral injection HENT: No OP obstrucion Head: Normocephalic.  Cardiovascular: Normal rate and regular rhythm.  Respiratory: Effort normal and breath sounds normal to anterior ascultation Skin: WDI, telangiectasias  Neurological Examination Mental Status: Alert, oriented, thought content appropriate. Speech fluent without evidence of aphasia. Able to follow 3 step commands without difficulty. Cranial Nerves: II: Visual  fields grossly normal,  III,IV, VI: ptosis not present, extra-ocular motions intact bilaterally, pupils equal, round, reactive to light and accommodation V,VII: smile symmetric, facial light touch sensation normal bilaterally VIII: hearing normal bilaterally IX,X: uvula rises symmetrically XI: bilateral shoulder shrug XII: midline tongue extension Motor: Right :Upper extremity  5/5Left: Upper extremity 5/5 Lower extremity 5/5Lower extremity 5/5 Tone and bulk:normal tone throughout; no atrophy noted Sensory:intact bilaterally  Plantars: Right: downgoingLeft: downgoing Cerebellar: no ataxianoted today Gait:did not assess  Discharge Diet   Regular thin liquids  DISCHARGE PLAN  Disposition:  Home  aspirin 81 mg daily and clopidogrel 75 mg daily for secondary stroke prevention x 3 weeks then plavix alone  Ongoing risk factor control by Primary Care Physician at time of discharge  Follow-up Opalski, Ekta, DO in 2 weeks.  Follow-up in [facility] in 4 weeks, office to schedule an appointment.   53 minutes were spent preparing discharge.  Burnetta Sabin, MSN, APRN, ANVP-BC, AGPCNP-BC Advanced Practice Stroke Nurse [place] for Schedule & Pager information 03/12/2018 4:27 PM    ATTENDING NOTE: I reviewed above note and agree with the assessment and plan. Pt was seen and examined.   67 year old female with history of CHF, COPD, MVP, smoker, lung cancer status post chemotherapy and surgery MVR, Raynaud's syndrome, and positive RPR admitted for vertigo, headache, left-sided numbness and weakness as well as gait imbalance.  CT no acute abnormality, no ICH.  Status post TPA.  CT head and neck showed bilateral ICA bifurcation atherosclerosis and bilateral fetal PCAs.  Reported right  ICA proximal occlusion likely due to artifact after reviewing neuro images.  MRI negative for stroke.  Carotid Doppler bilateral 1 to 79% stenosis.  EF 55 to 60%.  LDL 108 and A1c 5.3.  Patient symptoms all resolved on examination today.  Patient stated that she had no history of migraine, however this time she had a severe headache, 10/10, associated with dizziness, left face and arm numbness with slight or subtle weakness.  Given MRI negative, concerning current episode to be TIA versus aborted stroke versus complicated migraine.  Recommend aspirin and Plavix for 3 weeks and then Plavix alone.  Put on low-dose statin for LDL goal less than 70.  Patient had RPR 1:8 1 year ago.  Her PCP and infectious disease recommend patient contact health department for treatment.  Patient stated that she was seen by health department and was told that RPR was falsely positive due to some cross reaction.  On this admission, HIV negative.  RPR pending.  Discussed with ID, no need for LP or other treatment, she will be follow-up with ID as outpatient clinic.  PT/OT recommend home PT/OT.  Patient ready for discharge.  Neurology will sign off. Please call with questions. Pt will follow up with stroke clinic NP at [facility] in about 4 weeks. Thanks for the consult.   Rosalin Hawking, MD PhD Stroke Neurology 03/12/2018 10:02 PM

## 2018-03-12 NOTE — Progress Notes (Signed)
Pt return from MRI

## 2018-03-12 NOTE — Care Management Note (Signed)
Case Management Note  Patient Details  Name: Bitha Fauteux MRN: [id number] Date of Birth: Sep 28, 1950  Subjective/Objective:   Pt admitted on 03/11/18 after developing sudden onset vertigo, headache and gait imbalance after getting out from the shower.Marland Kitchen NIHSS was low at 4. +tPA. MRI - for acute CVA.  PTA, pt independent, lives alone.                   Action/Plan: PT recommending OP follow up; referral to [place].  Family member to stay with pt at dc. Rehab center will call pt for appointment.   Expected Discharge Date:  03/12/18               Expected Discharge Plan:  OP Rehab  In-House Referral:     Discharge planning Services  CM Consult  Post Acute Care Choice:    Choice offered to:     DME Arranged:    DME Agency:     HH Arranged:    HH Agency:     Status of Service:  Completed, signed off  If discussed at [employer] of Stay Meetings, dates discussed:    Additional Comments:  Reinaldo Raddle, RN, BSN  Trauma/Neuro ICU Case Manager [phone]

## 2018-03-12 NOTE — Progress Notes (Signed)
Occupational Therapy Evaluation Patient Details Name: Joy Williams MRN: [id number] DOB: 1951/01/26 Today's Date: 03/12/2018    History of Present Illness 67 y.o. female past medical history of lung cancer status post resection and chemotherapy, mitral valve regurgitation status post mitral valve replacement, Raynaud's disease, telangiectasia, COPD, chronic diastolic heart failure sent to the emergency room after developing sudden onset vertigo, headache and gait imbalance after getting out from the shower.Marland Kitchen NIHSS was low at 4. +tPA. MRI - for acute CVA.    Clinical Impression   PTA, pt lived alone with her dog "Kieth Brightly" and was independent with her ADL and mobility. Pt demonstrates mild balance impairment with dynamic tasks and will benefit form acute OT. Family states someone can be with Ms Nudo after DC as needed. Will follow acutely to facilitate safe DC home.     Follow Up Recommendations  No OT follow up;Supervision/Assistance - 24 hour(initially with A and IADL)    Equipment Recommendations  Tub/shower seat(pt thinks she has one in her closet)    Recommendations for Other Services       Precautions / Restrictions Precautions Precautions: Fall      Mobility Bed Mobility Overal bed mobility: Modified Independent              Transfers Overall transfer level: Needs assistance   Transfers: Sit to/from Stand Sit to Stand: S         General transfer comment: use of hands to stand.  no overt instability    Balance Overall balance assessment: Needs assistance Sitting-balance support: No upper extremity supported Sitting balance-Leahy Scale: Good     Standing balance support: No upper extremity supported;Single extremity supported Standing balance-Leahy Scale: Fair Standing balance comment: prefers to reach for stable surface to hold a conversation                      ADL either performed or assessed with clinical judgement   ADL Overall ADL's :  Needs assistance/impaired                                     Functional mobility during ADLs: Min guard General ADL Comments: overall set up/S for ADL at this time   Mild instability during dynamic tasks in bathroom     Vision Baseline Vision/History: Wears glasses Wears Glasses: At all times Patient Visual Report: No change from baseline Vision Assessment?: Yes Eye Alignment: Within Functional Limits Alignment/Gaze Preference: Within Defined Limits Tracking/Visual Pursuits: Able to track stimulus in all quads without difficulty Saccades: Within functional limits Convergence: Within functional limits Visual Fields: No apparent deficits     Perception     Praxis Praxis Praxis tested?: Within functional limits    Pertinent Vitals/Pain Pain Assessment: 0-10 Pain Score: 3  Faces Pain Scale: Hurts even more Pain Location: head Pain Descriptors / Indicators: Pressure Pain Intervention(s): Monitored during session     Hand Dominance Right   Extremity/Trunk Assessment Upper Extremity Assessment Upper Extremity Assessment: Overall WFL for tasks assessed   Lower Extremity Assessment Lower Extremity Assessment: Overall WFL for tasks assessed   Cervical / Trunk Assessment Cervical / Trunk Assessment: Normal   Communication Communication Communication: No difficulties   Cognition Arousal/Alertness: Awake/alert Behavior During Therapy: WFL for tasks assessed/performed Overall Cognitive Status: Within Functional Limits for tasks assessed  General Comments: mildly slowed thought processes   General Comments       Exercises     Shoulder Instructions      Home Living   Living Arrangements: Alone Available Help at Discharge: Other (Comment)(a family member to come stay with pt.) Type of Home: House Home Access: Stairs to enter(2)     Home Layout: One level;Other (Comment)(a step between kitchen and ?den  )     Bathroom Shower/Tub: Walk-in shower;Tub/shower unit   Armed forces training and education officer: Yes How Accessible: Accessible via walker Home Equipment: (thinks she has a shower chair)          Prior Functioning/Environment Level of Independence: Independent        Comments: drives        OT Problem List: Impaired balance (sitting and/or standing);Decreased safety awareness;Decreased knowledge of use of DME or AE;Pain      OT Treatment/Interventions: Self-care/ADL training;DME and/or AE instruction;Patient/family education;Balance training    OT Goals(Current goals can be found in the care plan section) Acute Rehab OT Goals Patient Stated Goal: to go home OT Goal Formulation: With patient/family Time For Goal Achievement: 03/19/18 Potential to Achieve Goals: Good  OT Frequency: Min 2X/week   Barriers to D/C:            Co-evaluation              AM-PAC PT "6 Clicks" Daily Activity     Outcome Measure Help from another person eating meals?: None Help from another person taking care of personal grooming?: None Help from another person toileting, which includes using toliet, bedpan, or urinal?: A Little Help from another person bathing (including washing, rinsing, drying)?: None Help from another person to put on and taking off regular upper body clothing?: None Help from another person to put on and taking off regular lower body clothing?: A Little 6 Click Score: 22   End of Session Equipment Utilized During Treatment: Gait belt Nurse Communication: Mobility status  Activity Tolerance: Patient tolerated treatment well Patient left: in chair;with call bell/phone within reach;with family/visitor present  OT Visit Diagnosis: Unsteadiness on feet (R26.81);Pain Pain - part of body: (head)                Time: 0814-4818 OT Time Calculation (min): 26 min Charges:  OT General Charges $OT Visit: 1 Visit OT Evaluation $OT Eval Moderate  Complexity: 1 Mod OT Treatments $Self Care/Home Management : 8-22 mins  Maurie Boettcher, OT/L   Acute OT Clinical Specialist Acute Rehabilitation Services Pager [phone] Office [phone]   [facility] 03/12/2018, 1:59 PM

## 2018-03-12 NOTE — Telephone Encounter (Signed)
Called and spoke to Katie about the patient's false positive RPR, they were needing conformation. MPulliam, CMA/RT(R)

## 2018-03-12 NOTE — Progress Notes (Signed)
SLP Cancellation Note  Patient Details Name: Joy Williams MRN: [id number] DOB: 12-14-1950   Cancelled treatment:       Reason Eval/Treat Not Completed: SLP screened, no needs identified, will sign off   Juan Quam Laurice 03/12/2018, 2:37 PM

## 2018-03-12 NOTE — Progress Notes (Signed)
*  Preliminary Results* Carotid artery duplex has been completed. Bilateral internal carotid arteries are 1-39%. Vertebral arteries are patent with antegrade flow.  03/12/2018 3:18 PM  Joy Williams Part

## 2018-03-12 NOTE — Progress Notes (Signed)
  Echocardiogram 2D Echocardiogram has been performed.  Joy Williams 03/12/2018, 12:35 PM

## 2018-03-12 NOTE — Telephone Encounter (Signed)
Patient admitted to [facility] for Stroke.  -- Nurse/ Burnetta Sabin request call back for assistant in locating any prior medical treatment notes of Stroke or symptoms.  --Forwarding message to medical assistant/ Lenna Sciara to contact her at [phone] direct phone# --glh

## 2018-03-12 NOTE — Progress Notes (Addendum)
STROKE TEAM PROGRESS NOTE   INTERVAL HISTORY Her son and daughter are at the bedside.  No stroke on MRI. She stills feels she is leaning to the L but did well with therapy. She did have a severe HA at time of symptom onset, including Nausea and vomiting. Denies photophobia, noises being too loud or other sensory abnormalities. HA yesterday 9/10 today 2/10. Await rest of workup prior to d/c home.   Per pt, had STD testing done 01/2017. RPR positive. Sent to [facility] for treatment. They said RPR was a false positive d/t high   Vitals:   03/12/18 0800 03/12/18 0900 03/12/18 1000 03/12/18 1100  BP: (!) 120/50 117/72 111/85 119/60  Pulse: 66 63 70 71  Resp: 17 14 (!) 21 (!) 22  Temp: 97.7 F (36.5 C)     TempSrc: Oral     SpO2: 100% 100% 99% 100%  Weight:      Height:        CBC:  Recent Labs  Lab 03/11/18 1230 03/11/18 1234  WBC 9.0  --   NEUTROABS 7.4  --   HGB 13.0 14.3  HCT 42.7 42.0  MCV 88.6  --   PLT 159  --     Basic Metabolic Panel:  Recent Labs  Lab 03/11/18 1230 03/11/18 1234  NA 137 135  K 4.1 4.3  CL 98 99  CO2 23  --   GLUCOSE 90 93  BUN 14 16  CREATININE 0.73 0.70  CALCIUM 9.6  --    Lipid Panel:     Component Value Date/Time   CHOL 170 03/12/2018 0405   CHOL 175 01/14/2017 0849   CHOL 177 10/28/2014 1025   TRIG 68 03/12/2018 0405   TRIG 115 10/28/2014 1025   HDL 48 03/12/2018 0405   HDL 59 01/14/2017 0849   HDL 70 10/28/2014 1025   CHOLHDL 3.5 03/12/2018 0405   VLDL 14 03/12/2018 0405   LDLCALC 108 (H) 03/12/2018 0405   LDLCALC 92 01/14/2017 0849   LDLCALC 84 10/28/2014 1025   HgbA1c:  Lab Results  Component Value Date   HGBA1C 5.3 03/12/2018   Urine Drug Screen:     Component Value Date/Time   LABOPIA NONE DETECTED 03/11/2018 1818   COCAINSCRNUR NONE DETECTED 03/11/2018 1818   LABBENZ NONE DETECTED 03/11/2018 1818   AMPHETMU NONE DETECTED 03/11/2018 1818   THCU NONE DETECTED 03/11/2018 1818   LABBARB NONE DETECTED 03/11/2018  1818    Alcohol Level     Component Value Date/Time   ETH <10 03/11/2018 1230    IMAGING Ct Angio Head W Or Wo Contrast  Result Date: 03/11/2018 CLINICAL DATA:  Headache, gait difficulty.  Imbalance. EXAM: CT ANGIOGRAPHY HEAD AND NECK TECHNIQUE: Multidetector CT imaging of the head and neck was performed using the standard protocol during bolus administration of intravenous contrast. Multiplanar CT image reconstructions and MIPs were obtained to evaluate the vascular anatomy. Carotid stenosis measurements (when applicable) are obtained utilizing NASCET criteria, using the distal internal carotid diameter as the denominator. CONTRAST:  26mL ISOVUE-370 IOPAMIDOL (ISOVUE-370) INJECTION 76% COMPARISON:  CT head earlier today. FINDINGS: CTA NECK FINDINGS Aortic arch: Standard branching. Imaged portion shows no evidence of aneurysm or dissection. No significant stenosis of the major arch vessel origins. Right carotid system: Heavily calcified plaque. Intraluminal soft plaque/thrombus, occlusive, 1 cm above the origin of the RIGHT internal carotid artery, no measurable lumen. Stenosis estimated at 95%, possibly greater. Good opacification of the RIGHT ICA distally, although  unclear how antegrade flow is accomplished. No visible dissection. Left carotid system: No evidence of dissection, stenosis (50% or greater) or occlusion. Heavily calcified plaque is accompanied by moderate nonstenotic posterior wall soft plaque. No ulceration or dissection. Vertebral arteries: Both patent, dominant LEFT. Skeleton: No worrisome osseous lesion. Advanced spondylosis C5-C6, with osseous spurring. Degenerative anterolisthesis C3-4 and C4-5. Dental amalgam. No worrisome osseous lesion. Other neck: No concerning neck masses. Upper chest: Biapical scarring.  COPD. Review of the MIP images confirms the above findings CTA HEAD FINDINGS Anterior circulation: Mild calcific plaque both carotid siphons. ICA termini widely patent. No ACA  or MCA flow-limiting stenosis. No MCA branch occlusion. No distal embolus on the RIGHT. Posterior circulation: BILATERAL fetal PCA. Basilar hypoplasia. LEFT vertebral dominant. RIGHT vertebral supplies only PICA. Venous sinuses: As permitted by contrast timing, patent. Anatomic variants: None of significance. Delayed phase: Not performed. Review of the MIP images confirms the above findings IMPRESSION: Occlusive soft plaque/thrombus, proximal RIGHT ICA. No measurable lumen. Good opacification of the RIGHT ICA more distally, although unclear how antegrade flow is accomplished. No distal emboli in the RIGHT anterior intracranial circulation. Nonstenotic plaque LEFT carotid bifurcation. These results were called by telephone at the time of interpretation on 03/11/2018 at 1:35 pm to Dr. Samara Snide , who verbally acknowledged these results. Electronically Signed   By: Staci Righter M.D.   On: 03/11/2018 13:48   Ct Head Wo Contrast  Result Date: 03/11/2018 CLINICAL DATA:  67 year old female with headache, nausea, vomiting and dizziness EXAM: CT HEAD WITHOUT CONTRAST TECHNIQUE: Contiguous axial images were obtained from the base of the skull through the vertex without intravenous contrast. COMPARISON:  None. FINDINGS: Brain: No evidence of acute infarction, hemorrhage, hydrocephalus, extra-axial collection or mass lesion/mass effect. Small circumscribed hypoechoic focus in the left caudate head which may represent a subacute or remote lacunar infarct. Vascular: No hyperdense vessel or unexpected calcification. Skull: Normal. Negative for fracture or focal lesion. Sinuses/Orbits: No acute finding. Other: None. IMPRESSION: 1. Small circumscribed hypoechoic focus in the left caudate head which may represent a late subacute or remote lacunar infarct. 2. Otherwise, no acute intracranial abnormality. Electronically Signed   By: Jacqulynn Cadet M.D.   On: 03/11/2018 12:36   Ct Angio Neck W Or Wo Contrast  Result  Date: 03/11/2018 CLINICAL DATA:  Headache, gait difficulty.  Imbalance. EXAM: CT ANGIOGRAPHY HEAD AND NECK TECHNIQUE: Multidetector CT imaging of the head and neck was performed using the standard protocol during bolus administration of intravenous contrast. Multiplanar CT image reconstructions and MIPs were obtained to evaluate the vascular anatomy. Carotid stenosis measurements (when applicable) are obtained utilizing NASCET criteria, using the distal internal carotid diameter as the denominator. CONTRAST:  17mL ISOVUE-370 IOPAMIDOL (ISOVUE-370) INJECTION 76% COMPARISON:  CT head earlier today. FINDINGS: CTA NECK FINDINGS Aortic arch: Standard branching. Imaged portion shows no evidence of aneurysm or dissection. No significant stenosis of the major arch vessel origins. Right carotid system: Heavily calcified plaque. Intraluminal soft plaque/thrombus, occlusive, 1 cm above the origin of the RIGHT internal carotid artery, no measurable lumen. Stenosis estimated at 95%, possibly greater. Good opacification of the RIGHT ICA distally, although unclear how antegrade flow is accomplished. No visible dissection. Left carotid system: No evidence of dissection, stenosis (50% or greater) or occlusion. Heavily calcified plaque is accompanied by moderate nonstenotic posterior wall soft plaque. No ulceration or dissection. Vertebral arteries: Both patent, dominant LEFT. Skeleton: No worrisome osseous lesion. Advanced spondylosis C5-C6, with osseous spurring. Degenerative anterolisthesis C3-4 and C4-5.  Dental amalgam. No worrisome osseous lesion. Other neck: No concerning neck masses. Upper chest: Biapical scarring.  COPD. Review of the MIP images confirms the above findings CTA HEAD FINDINGS Anterior circulation: Mild calcific plaque both carotid siphons. ICA termini widely patent. No ACA or MCA flow-limiting stenosis. No MCA branch occlusion. No distal embolus on the RIGHT. Posterior circulation: BILATERAL fetal PCA. Basilar  hypoplasia. LEFT vertebral dominant. RIGHT vertebral supplies only PICA. Venous sinuses: As permitted by contrast timing, patent. Anatomic variants: None of significance. Delayed phase: Not performed. Review of the MIP images confirms the above findings IMPRESSION: Occlusive soft plaque/thrombus, proximal RIGHT ICA. No measurable lumen. Good opacification of the RIGHT ICA more distally, although unclear how antegrade flow is accomplished. No distal emboli in the RIGHT anterior intracranial circulation. Nonstenotic plaque LEFT carotid bifurcation. These results were called by telephone at the time of interpretation on 03/11/2018 at 1:35 pm to Dr. Samara Snide , who verbally acknowledged these results. Electronically Signed   By: Staci Righter M.D.   On: 03/11/2018 13:48   Mr Brain Wo Contrast  Result Date: 03/12/2018 CLINICAL DATA:  Acute onset of vertigo, headache and gait disturbance. History of lung cancer. EXAM: MRI HEAD WITHOUT CONTRAST TECHNIQUE: Multiplanar, multiecho pulse sequences of the brain and surrounding structures were obtained without intravenous contrast. COMPARISON:  CT studies done yesterday. FINDINGS: Brain: Diffusion imaging does not show any acute or subacute infarction. The brainstem is normal. There are a few old small vessel cerebellar infarctions. Single old small vessel infarction left thalamus. Cerebral hemispheres appear otherwise normal without evidence of old small or large vessel infarction. No mass lesion, hemorrhage, hydrocephalus or extra-axial collection. Vascular: Major vessels at the base of the brain show flow. Skull and upper cervical spine: Negative Sinuses/Orbits: Clear/normal Other: None IMPRESSION: No acute intracranial finding. Mild chronic small-vessel ischemic change of the cerebellum and left thalamus. Electronically Signed   By: Nelson Chimes M.D.   On: 03/12/2018 11:24   Carotid Doppler   There is 1-39% bilateral ICA stenosis. Vertebral artery flow is  antegrade.   2D Echocardiogram  - Left ventricle: The cavity size was normal. Wall thickness was increased in a pattern of moderate to severe LVH. Systolic function was normal. The estimated ejection fraction was in the  range of 55% to 60%. Wall motion was normal; there were no regional wall motion abnormalities. Features are consistent with a pseudonormal left ventricular filling pattern, with concomitant abnormal relaxation and increased filling pressure (grade 2 diastolic dysfunction). - Aortic valve: There was no regurgitation. - Mitral valve: A prosthesis was present and functioning normally. The prosthesis had a normal range of motion. The sewing ring appeared normal, had no rocking motion, and showed no evidence of dehiscence. The findings are consistent with trivial stenosis. Mean gradient (D): 2 mm Hg. Valve area by continuity equation (using LVOT flow): 2.62 cm^2. - Left atrium: The atrium was moderately to severely dilated. - Right ventricle: Systolic function was normal. - Atrial septum: No defect or patent foramen ovale was identified. - Tricuspid valve: There was trivial regurgitation. Impressions:  No cardiac source of emboli was indentified. Normal LV EF, grade 2 diastolic dysfunction. Bioprosthetic mitral valve appears in good posiion with a trivial gradient.   PHYSICAL EXAM General: Appears well-developed  Psych: Affect appropriate to situation Eyes: No scleral injection HENT: No OP obstrucion Head: Normocephalic.  Cardiovascular: Normal rate and regular rhythm.  Respiratory: Effort normal and breath sounds normal to anterior ascultation Skin: WDI, telangiectasias  Neurological  Examination Mental Status: Alert, oriented, thought content appropriate. Speech fluent without evidence of aphasia. Able to follow 3 step commands without difficulty. Cranial Nerves: II: Visual fields grossly normal,  III,IV, VI: ptosis not present, extra-ocular motions intact bilaterally,  pupils equal, round, reactive to light and accommodation V,VII: smile symmetric, facial light touch sensation normal bilaterally VIII: hearing normal bilaterally IX,X: uvula rises symmetrically XI: bilateral shoulder shrug XII: midline tongue extension Motor: Right :Upper extremity 5/5Left: Upper extremity 5/5 Lower extremity 5/5Lower extremity 5/5 Tone and bulk:normal tone throughout; no atrophy noted Sensory:intact bilaterally  Plantars: Right: downgoingLeft: downgoing Cerebellar: no ataxianoted today Gait:did not assess   ASSESSMENT/PLAN Joy Williams is a 67 y.o. 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 Received IV tPA 03/11/2018 at 1246.  Stroke-like symptoms s/p IV tPA Complicated Migraine with   CT head L caudate head hypoechoic focus    CTA head & neck R ICA plaque/thrombus w/ good flow distally. Nonstenotic plaque L ICA bifurcation.   MRI  No acute stroke  Carotid Doppler  B ICA 1-39% stenosis, VAs antegrade   2D Echo  EF 55-60%. No source of embolus   LDL 108  HgbA1c 5.3  SCDs for VTE prophylaxis  aspirin 81 mg daily prior to admission, now on No antithrombotic as within 24h of tPA administration. No hemorrhage on post tPA imaging. Start aspirin 81 mg daily. Add plavix 75 x 3 weeks. Prefer plavix long-term without aspirin. Pt aware. She is hesitant to take plavix (does not like pills). Will prescribe plavix. 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 Pt sent to health dept for treatment. They felt the titer was a false positive and pt did not need treatment. Unclear if they did additional testing. Repeat RPR ordered this am. Discussed with Dr. [name] who will look into (call health dept) and follow up if needed as an OP. 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  eCigarette smoker, advised to stop smoking  ETOH use, advised to drink no more than 1 drink(s) a day  chronic diastolic Congestive heart failure  Other Active Problems  Hx lung cancer  COPD  MR replacement  Hypothyroidism  Raynaud's  Will likely d/c later today.   Hospital day # 1  Burnetta Sabin, MSN, APRN, ANVP-BC, AGPCNP-BC Advanced Practice Stroke Nurse [employer] [place] for Schedule & Pager information 03/12/2018 3:39 PM   To contact Stroke Continuity provider, please refer to [email or web address] After hours, contact General Neurology

## 2018-03-12 NOTE — Evaluation (Signed)
Physical Therapy Evaluation Patient Details Name: Joy Williams MRN: [id number] DOB: 03/18/1951 Today's Date: 03/12/2018   History of Present Illness  67 y.o. female past medical history of lung cancer status post resection and chemotherapy, mitral valve regurgitation status post mitral valve replacement, Raynaud's disease, telangiectasia, COPD, chronic diastolic heart failure sent to the emergency room after developing sudden onset vertigo, headache and gait imbalance after getting out from the shower.Marland Kitchen NIHSS was low at 4. +tPA. MRI - for acute CVA.   Clinical Impression  Pt admitted with/for s/s of stroke, but - on MRI.  Nevertheless, pt's balance is not at baseline and pt shows risk to fall with scanning, directional changes and change of speed.  Pt currently limited functionally due to the problems listed below.  (see problems list.)  Pt will benefit from PT to maximize function and safety to be able to get home safely with available assist.     Follow Up Recommendations Home health PT;Other (comment)(OPPT if balance fails to improve in a short period)    Equipment Recommendations  None recommended by PT    Recommendations for Other Services       Precautions / Restrictions Precautions Precautions: Fall      Mobility  Bed Mobility               General bed mobility comments: up OOB on arrival  Transfers Overall transfer level: Needs assistance   Transfers: Sit to/from Stand Sit to Stand: Min guard         General transfer comment: use of hands to stand.  no overt instability  Ambulation/Gait Ambulation/Gait assistance: Min assist;Min guard Gait Distance (Feet): 300 Feet Assistive device: None Gait Pattern/deviations: Step-through pattern Gait velocity: moderate Gait velocity interpretation: <1.8 ft/sec, indicate of risk for recurrent falls General Gait Details: Episodes of unsteadiness with scanning, change of direction and speed changes.  Stairs             Wheelchair Mobility    Modified Rankin (Stroke Patients Only) Modified Rankin (Stroke Patients Only) Pre-Morbid Rankin Score: No symptoms Modified Rankin: Slight disability     Balance Overall balance assessment: Needs assistance Sitting-balance support: No upper extremity supported Sitting balance-Leahy Scale: Good     Standing balance support: No upper extremity supported;Single extremity supported Standing balance-Leahy Scale: Fair Standing balance comment: prefers to reach for stable surface to hold a conversation                 Standardized Balance Assessment Standardized Balance Assessment : Dynamic Gait Index   Dynamic Gait Index Level Surface: Mild Impairment Change in Gait Speed: Mild Impairment Gait with Horizontal Head Turns: Mild Impairment Gait with Vertical Head Turns: Mild Impairment Step Over Obstacle: Mild Impairment       Pertinent Vitals/Pain Pain Assessment: 0-10 Faces Pain Scale: Hurts even more Pain Location: head Pain Descriptors / Indicators: Pressure Pain Intervention(s): Monitored during session    Home Living   Living Arrangements: Alone Available Help at Discharge: Other (Comment)(a family member to come stay with pt.) Type of Home: House Home Access: Stairs to enter(2)     Home Layout: One level;Other (Comment)(a step between kitchen and ?den ) Home Equipment: (thinks she has a Civil engineer, contracting)      Prior Function Level of Independence: Independent         Comments: drives     Hand Dominance   Dominant Hand: Right    Extremity/Trunk Assessment   Upper Extremity Assessment Upper Extremity Assessment: Overall  WFL for tasks assessed    Lower Extremity Assessment Lower Extremity Assessment: Overall WFL for tasks assessed       Communication   Communication: No difficulties  Cognition Arousal/Alertness: Awake/alert Behavior During Therapy: WFL for tasks assessed/performed Overall Cognitive Status:  Within Functional Limits for tasks assessed                                 General Comments: mildly slowed thought processes      General Comments General comments (skin integrity, edema, etc.): vss    Exercises     Assessment/Plan    PT Assessment Patient needs continued PT services  PT Problem List Decreased activity tolerance;Decreased balance;Decreased mobility;Decreased safety awareness       PT Treatment Interventions Gait training;Stair training;Functional mobility training;Therapeutic activities;Balance training;Patient/family education    PT Goals (Current goals can be found in the Care Plan section)  Acute Rehab PT Goals Patient Stated Goal: to go home PT Goal Formulation: With patient Time For Goal Achievement: 03/19/18 Potential to Achieve Goals: Good    Frequency Min 3X/week   Barriers to discharge        Co-evaluation               AM-PAC PT "6 Clicks" Daily Activity  Outcome Measure Difficulty turning over in bed (including adjusting bedclothes, sheets and blankets)?: A Little Difficulty moving from lying on back to sitting on the side of the bed? : A Little Difficulty sitting down on and standing up from a chair with arms (e.g., wheelchair, bedside commode, etc,.)?: A Little Help needed moving to and from a bed to chair (including a wheelchair)?: A Little Help needed walking in hospital room?: A Little Help needed climbing 3-5 steps with a railing? : A Little 6 Click Score: 18    End of Session   Activity Tolerance: Patient tolerated treatment well Patient left: in chair;with call bell/phone within reach Nurse Communication: Mobility status PT Visit Diagnosis: Unsteadiness on feet (R26.81)    Time: 1240-1302 PT Time Calculation (min) (ACUTE ONLY): 22 min   Charges:   PT Evaluation $PT Eval Low Complexity: 1 Low          03/12/2018  Donnella Sham, PT Acute Rehabilitation Services [phone]   (pager) [phone]  (office)  [name] 03/12/2018, 4:38 PM

## 2018-03-13 LAB — RPR, QUANT+TP ABS (REFLEX): TREPONEMA PALLIDUM AB: NEGATIVE

## 2018-03-13 LAB — RPR: RPR Ser Ql: REACTIVE — AB

## 2018-03-16 ENCOUNTER — Other Ambulatory Visit: Payer: Self-pay

## 2018-03-16 NOTE — Patient Outreach (Addendum)
[facility]) Care Management  03/16/2018  Joy Williams [birth year] [id number]   EMMI: stroke red alert Referral date: 03/16/18 Referral reason: scheduled follow up: no Insurance: Medicare Day # 1  Telephone call to patient regarding EMMI stroke red alert. HIPAA verified with patient. Explained reason for call. Patient states she has scheduled a follow up appointment with her primary MD for tomorrow 03/17/18 and has a follow up with the neurologist on 04/08/18.  Patient states she has her medications and takes them as prescribed. Patient states she is taking a blood thinner. RNCM discussed signs/ symptoms of bleeding. Advised patient to notify her doctor for these symptoms. Patient verbalized understanding.  Patient reports having leg cramps for the past 2 nights. She states she is unsure if the leg cramps are due to her cholesterol medication, Lipitor. RNCM advised patient to report these symptoms to her primary care physician. Patient verbalized understanding.  Patient states she has had headaches since discharge from the hospital. She states the headaches are on the left side of her head and towards the back. Patient states she  Is taking tylenol for the headaches at least 2 times per days. She states the tylenol is helping with the pain and fullness she fills in her head. RNCM advised patient to notify her doctor for worsening symptoms. Reviewed and discussed signs/ symptoms of stroke with patient. Patient advised to call 911 for stroke like symptoms. Patient denies any further needs or concerns at this times.  RNCM advised patient to notify MD of any changes in condition prior to scheduled appointment. RNCM provided contact name and number: 24 hour nurse advise line [phone].  RNCM verified patient aware of 911 services for urgent/ emergent needs.    PLAN: RNCM will close patient due to patient being assessed and having no further needs .  Quinn Plowman  RN,BSN,CCM [facility] Telephonic  [phone]

## 2018-03-17 ENCOUNTER — Encounter: Payer: Self-pay | Admitting: Family Medicine

## 2018-03-17 ENCOUNTER — Ambulatory Visit (INDEPENDENT_AMBULATORY_CARE_PROVIDER_SITE_OTHER): Payer: Medicare Other | Admitting: Family Medicine

## 2018-03-17 VITALS — BP 111/71 | HR 72 | Resp 17 | Wt 140.0 lb

## 2018-03-17 DIAGNOSIS — Z9289 Personal history of other medical treatment: Secondary | ICD-10-CM

## 2018-03-17 DIAGNOSIS — R2990 Unspecified symptoms and signs involving the nervous system: Secondary | ICD-10-CM

## 2018-03-17 DIAGNOSIS — I6523 Occlusion and stenosis of bilateral carotid arteries: Secondary | ICD-10-CM | POA: Diagnosis not present

## 2018-03-17 DIAGNOSIS — I639 Cerebral infarction, unspecified: Secondary | ICD-10-CM | POA: Diagnosis not present

## 2018-03-17 DIAGNOSIS — E785 Hyperlipidemia, unspecified: Secondary | ICD-10-CM | POA: Diagnosis not present

## 2018-03-17 NOTE — Patient Instructions (Addendum)
In 6 weeks you come in for Medicare wellness as well as we will recheck your ALT since you will be on Lipitor for 6 to 8 weeks at that time.  Then I would like to recheck your cholesterol in all your fasting labs 4 months from when it was recently checked while you are in the hospital-this would be end of February.  So make an appointment for fasting blood work 3 days prior to seeing me at the end of February.  Levada Dy- please place order for FBW for end of Feb- whoile panel  Please realize, EXERCISE IS MEDICINE!  -  American Heart Association [facility]) guidelines for exercise : If you are in good health, without any medical conditions, you should engage in 150-300 minutes of moderate intensity aerobic activity per week.  This means you should be huffing and puffing throughout your workout.   Engaging in regular exercise will improve brain function and memory, as well as improve mood, boost immune system and help with weight management.  As well as the other, more well-known effects of exercise such as decreasing blood sugar levels, decreasing blood pressure,  and decreasing bad cholesterol levels/ increasing good cholesterol levels.     -  The AHA strongly endorses consumption of a diet that contains a variety of foods from all the food categories with an emphasis on fruits and vegetables; fat-free and low-fat dairy products; cereal and grain products; legumes and nuts; and fish, poultry, and/or extra lean meats.    Excessive food intake, especially of foods high in saturated and trans fats, sugar, and salt, should be avoided.    Adequate water intake of roughly 1/2 of your weight in pounds, should equal the ounces of water per day you should drink.  So for instance, if you're 200 pounds, that would be 100 ounces of water per day.         Mediterranean Diet  Why follow it? Research shows. . Those who follow the Mediterranean diet have a reduced risk of heart disease  . The diet is associated with a  reduced incidence of Parkinson's and Alzheimer's diseases . People following the diet may have longer life expectancies and lower rates of chronic diseases  . The Dietary Guidelines for Americans recommends the Mediterranean diet as an eating plan to promote health and prevent disease  What Is the Mediterranean Diet?  . Healthy eating plan based on typical foods and recipes of Mediterranean-style cooking . The diet is primarily a plant based diet; these foods should make up a majority of meals   Starches - Plant based foods should make up a majority of meals - They are an important sources of vitamins, minerals, energy, antioxidants, and fiber - Choose whole grains, foods high in fiber and minimally processed items  - Typical grain sources include wheat, oats, barley, corn, brown rice, bulgar, farro, millet, polenta, couscous  - Various types of beans include chickpeas, lentils, fava beans, black beans, white beans   Fruits  Veggies - Large quantities of antioxidant rich fruits & veggies; 6 or more servings  - Vegetables can be eaten raw or lightly drizzled with oil and cooked  - Vegetables common to the traditional Mediterranean Diet include: artichokes, arugula, beets, broccoli, brussel sprouts, cabbage, carrots, celery, collard greens, cucumbers, eggplant, kale, leeks, lemons, lettuce, mushrooms, okra, onions, peas, peppers, potatoes, pumpkin, radishes, rutabaga, shallots, spinach, sweet potatoes, turnips, zucchini - Fruits common to the Mediterranean Diet include: apples, apricots, avocados, cherries, clementines, dates, figs,  grapefruits, grapes, melons, nectarines, oranges, peaches, pears, pomegranates, strawberries, tangerines  Fats - Replace butter and margarine with healthy oils, such as olive oil, canola oil, and tahini  - Limit nuts to no more than a handful a day  - Nuts include walnuts, almonds, pecans, pistachios, pine nuts  - Limit or avoid candied, honey roasted or heavily salted  nuts - Olives are central to the Mediterranean diet - can be eaten whole or used in a variety of dishes   Meats Protein - Limiting red meat: no more than a few times a month - When eating red meat: choose lean cuts and keep the portion to the size of deck of cards - Eggs: approx. 0 to 4 times a week  - Fish and lean poultry: at least 2 a week  - Healthy protein sources include, chicken, [place], lean beef, lamb - Increase intake of seafood such as tuna, salmon, trout, mackerel, shrimp, scallops - Avoid or limit high fat processed meats such as sausage and bacon  Dairy - Include moderate amounts of low fat dairy products  - Focus on healthy dairy such as fat free yogurt, skim milk, low or reduced fat cheese - Limit dairy products higher in fat such as whole or 2% milk, cheese, ice cream  Alcohol - Moderate amounts of red wine is ok  - No more than 5 oz daily for women (all ages) and men older than age 31  - No more than 10 oz of wine daily for men younger than 77  Other - Limit sweets and other desserts  - Use herbs and spices instead of salt to flavor foods  - Herbs and spices common to the traditional Mediterranean Diet include: basil, bay leaves, chives, cloves, cumin, fennel, garlic, lavender, marjoram, mint, oregano, parsley, pepper, rosemary, sage, savory, sumac, tarragon, thyme   It's not just a diet, it's a lifestyle:  . The Mediterranean diet includes lifestyle factors typical of those in the region  . Foods, drinks and meals are best eaten with others and savored . Daily physical activity is important for overall good health . This could be strenuous exercise like running and aerobics . This could also be more leisurely activities such as walking, housework, yard-work, or taking the stairs . Moderation is the key; a balanced and healthy diet accommodates most foods and drinks . Consider portion sizes and frequency of consumption of certain foods   Meal Ideas & Options:   . Breakfast:  o Whole wheat toast or whole wheat [name] muffins with peanut butter & hard boiled egg o Steel cut oats topped with apples & cinnamon and skim milk  o Fresh fruit: banana, strawberries, melon, berries, peaches  o Smoothies: strawberries, bananas, greek yogurt, peanut butter o Low fat greek yogurt with blueberries and granola  o Egg white omelet with spinach and mushrooms o Breakfast couscous: whole wheat couscous, apricots, skim milk, cranberries  . Sandwiches:  o Hummus and grilled vegetables (peppers, zucchini, squash) on whole wheat bread   o Grilled chicken on whole wheat pita with lettuce, tomatoes, cucumbers or tzatziki  o Tuna salad on whole wheat bread: tuna salad made with greek yogurt, olives, red peppers, capers, green onions o Garlic rosemary lamb pita: lamb sauted with garlic, rosemary, salt & pepper; add lettuce, cucumber, greek yogurt to pita - flavor with lemon juice and black pepper  . Seafood:  o Mediterranean grilled salmon, seasoned with garlic, basil, parsley, lemon juice and black pepper o Shrimp, lemon,  and spinach whole-grain pasta salad made with low fat greek yogurt  o Seared scallops with lemon orzo  o Seared tuna steaks seasoned salt, pepper, coriander topped with tomato mixture of olives, tomatoes, olive oil, minced garlic, parsley, green onions and cappers  . Meats:  o Herbed greek chicken salad with kalamata olives, cucumber, feta  o Red bell peppers stuffed with spinach, bulgur, lean ground beef (or lentils) & topped with feta   o Kebabs: skewers of chicken, tomatoes, onions, zucchini, squash  o [place] burgers: made with red onions, mint, dill, lemon juice, feta cheese topped with roasted red peppers . Vegetarian o Cucumber salad: cucumbers, artichoke hearts, celery, red onion, feta cheese, tossed in olive oil & lemon juice  o Hummus and whole grain pita points with a greek salad (lettuce, tomato, feta, olives, cucumbers, red onion) o Lentil  soup with celery, carrots made with vegetable broth, garlic, salt and pepper  o Tabouli salad: parsley, bulgur, mint, scallions, cucumbers, tomato, radishes, lemon juice, olive oil, salt and pepper.    Guidelines for a Low Cholesterol, Low Saturated Fat Diet   Fats - Limit total intake of fats and oils. - Avoid butter, stick margarine, shortening, lard, palm and coconut oils. - Limit mayonnaise, salad dressings, gravies and sauces, unless they are homemade with low-fat ingredients. - Limit chocolate. - Choose low-fat and nonfat products, such as low-fat mayonnaise, low-fat or non-hydrogenated peanut butter, low-fat or fat-free salad dressings and nonfat gravy. - Use vegetable oil, such as canola or olive oil. - Look for margarine that does not contain trans fatty acids. - Use nuts in moderate amounts. - Read ingredient labels carefully to determine both amount and type of fat present in foods. Limit saturated and trans fats! - Avoid high-fat processed and convenience foods.  Meats and Meat Alternatives - Choose fish, chicken, [place] and lean meats. - Use dried beans, peas, lentils and tofu. - Limit egg yolks to three to four per week. - If you eat red meat, limit to no more than three servings per week and choose loin or round cuts. - Avoid fatty meats, such as bacon, sausage, franks, luncheon meats and ribs. - Avoid all organ meats, including liver.  Dairy - Choose nonfat or low-fat milk, yogurt and cottage cheese. - Most cheeses are high in fat. Choose cheeses made from non-fat milk, such as mozzarella and ricotta cheese. - Choose light or fat-free cream cheese and sour cream. - Avoid cream and sauces made with cream.  Fruits and Vegetables - Eat a wide variety of fruits and vegetables. - Use lemon juice, vinegar or "mist" olive oil on vegetables. - Avoid adding sauces, fat or oil to vegetables.  Breads, Cereals and Grains - Choose whole-grain breads, cereals, pastas and  rice. - Avoid high-fat snack foods, such as granola, cookies, pies, pastries, doughnuts and croissants.  Cooking Tips - Avoid deep fried foods. - Trim visible fat off meats and remove skin from poultry before cooking. - Bake, broil, boil, poach or roast poultry, fish and lean meats. - Drain and discard fat that drains out of meat as you cook it. - Add little or no fat to foods. - Use vegetable oil sprays to grease pans for cooking or baking. - Steam vegetables. - Use herbs or no-oil marinades to flavor foods.

## 2018-03-17 NOTE — Progress Notes (Signed)
Impression and Recommendations:    1. Stroke-like episode ([place]) s/p tPA   2. Hyperlipidemia LDL goal <70   3. History of RPR test, 9/18 felt to be false positive      Stroke-like episode ([place]) s/p tPA  Hyperlipidemia LDL goal <70  History of RPR test, 9/18 felt to be false positive  -Regarding pt's recent hospitalization and/or ED visit: reviewed in great detail recent hospitalization notes, clinical lab tests, tests in the radiology section of CPT, tests in the medicine testing of CPT, and obtained history from family member. -Independent visualization of image, tracing, or specimen itself done by me today.   1. Follow-Up from Stroke-Like Episode - Neurology - STRONGLY encouraged patient to follow up with all specialists as recommended.  - Discussed importance of directing all questions to neurology.  Advised patient to clarify what exactly occurred, whether it was a stroke or a "stroke-like event."  - Reviewed importance of watching for any new red flag neurological symptoms, including garbled speech, issues with vision, numbness, loss of speech, new onset acute headache, etc.  Reviewed red flag symptoms at length.  Advised patient to dial 911 immediately if such symptoms occur.  - Discussed importance of lowering risk for additional cardiovascular event.  Hospital Visit 03/11/2018-03/12/2018 Admitted 03/11/2018, discharged 03/12/2018 for a stroke like episode status post tPa.    Patient with history of lung cancer status post resection & chemotherapy.  MV-regurge, status post MVR.  Joy Williams's, COPD, chronic diastolic heart failure.  Presented to ED with sudden onset dizziness, headache, and gait imbalance.  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  Was administered tPa after CT scan was negative for hemorrhage.  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  Carotid  doppler bilateral studies 1-39% stenosis, and cardiac echo EF of 55-60%.  Patient will follow up with stroke clinic NP at [facility] in 4 weeks.  Patient to follow with Joy Williams of infectious disease.  Patient was sent home on aspirin, atorvastatin, and Plavix.  2. Cholesterol - Treatment on Atorvastatin - Continue medication as prescribed.  See med list today.  - Encouraged patient to go to www.[email or web address] and research cholesterol to further educate herself.   - Dietary changes such as low saturated & trans fat and low carb/ ketogenic diets discussed with patient.  Encouraged regular exercise and weight loss when appropriate.   - Educational handouts provided at patient's desire.  3. Infectious Disease - Referral placed to infectious disease if needed.  - Patient knows to visit infectious disease ASAP and ask all the questions she desires.  Patient will ask if her RPR will always be positive, if it had anything to do with her recent neurological event, what is the connection between HPV and RPR, is it something to worry about in the future, etc.  4. Lifestyle & Preventative Health Maintenance - Advised patient to continue working toward exercising to improve overall mental, physical, and emotional health.    Joy Williams guidelines for healthy diet, basically Mediterranean diet, and exercise guidelines of 30 minutes 5 days per week or more discussed in detail.  Health counseling performed.  All questions answered.  - Encouraged patient to engage in daily physical activity, especially a formal exercise routine.  Recommended that the patient eventually strive for at least 150 minutes of moderate cardiovascular activity per week according to guidelines established by the Joy Williams.   -  Healthy dietary habits encouraged, including low-carb, and high amounts of lean protein in diet.   - Patient should also consume adequate amounts of water.  5.  Follow-Up - Prescriptions refilled today PRN. - Re-check fasting lab work as recommended, end of February. - Return 6 weeks from 10/26, mid-December, to re-check liver enzymes. - STRONGLY encouraged patient to follow up with specialists. - Otherwise, continue to return for CPE and chronic follow-up as scheduled.   - Patient knows to call in sooner if desired to address acute concerns.    Medications Discontinued During This Encounter  Medication Reason  . vitamin C (ASCORBIC ACID) 500 MG tablet Patient Preference     Gross side effects, risk and benefits, and alternatives of medications and treatment plan in general discussed with patient.  Patient is aware that all medications have potential side effects and we are unable to predict every side effect or drug-drug interaction that may occur.   Patient will call with any questions prior to using medication if they have concerns.    Expresses verbal understanding and consents to current therapy and treatment regimen.  No barriers to understanding were identified.  Red flag symptoms and signs discussed in detail.  Patient expressed understanding regarding what to do in case of emergency\urgent symptoms  Please see AVS handed out to patient at the end of our visit for further patient instructions/ counseling done pertaining to today's office visit.   Return for Mid December for Medicare wellness, recheck ALT; then end of February OV with me FBW prior.     Note:  This note was prepared with assistance of Dragon voice recognition software. Occasional wrong-word or sound-a-like substitutions may have occurred due to the inherent limitations of voice recognition software.   This document serves as a record of services personally performed by Joy Dance, DO. It was created on her behalf by Joy Williams, a trained medical scribe. The creation of this record is based on the scribe's personal observations and the provider's statements to  them.   I have reviewed the above medical documentation for accuracy and completeness and I concur.  Joy Williams 03/26/18 5:01 PM    --------------------------------------------------------------------------------------------------------------    Subjective:     HPI: Joy Williams is a 67 y.o. female who presents to [place] [facility] today for issues as discussed below.  History of Hospital Follow-Up Admitted 03/11/2018, discharged 03/12/2018 for a stroke like episode status post tPa.    Patient with history of lung cancer status post resection & chemotherapy.  MV-regurge, status post MVR.  Joy Williams's, COPD, chronic diastolic heart failure.  Presented to ED with sudden onset dizziness, headache, and gait imbalance.  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  Was administered tPa after CT scan was negative for hemorrhage.  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  Carotid doppler bilateral studies 1-39% stenosis, and cardiac echo EF of 55-60%.  Patient will follow up with stroke clinic NP at [facility] in 4 weeks.  Patient will follow up with Joy Williams of infectious disease.  Patient was sent home on aspirin, atorvastatin, and Plavix.  Appointment Today - F/up from Stroke-Like Episode Patient confirms she is now on Lipitor, Plavix, and aspirin. Has an appointment on November 22 with [facility].  Patient confirms that it affected her left side; she was in the shower and had a major acute onset headache in  the left side.  Was leaning to the left; states "I knew it wasn't normal."  She sat on the toilet seat and lifted her arm; couldn't feel her arm.  Managed to get to the phone and call her daughter, who called 911.  She was given tPa to break up the clot; she had a CT scan and a CT scan with  contrast.  Otherwise, states she's been okay.  This is the first episode she's had with a major headache and localized event.  Before her valve replacement, she had lightheadedness, and always had low blood pressure.  Would sometimes have "episodes of whoosh and lightheadedness."  Notes that this episode was "very weird" in comparison.  States that her eating is back to normal.  "I'm trying to do carb-free, healthy."  Otherwise notes she's eating and sleeping okay.  Denies issues using the bathroom, either with bowel movements or urination.   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    Wt Readings from Last 3 Encounters:  03/17/18 140 lb (63.5 kg)  03/11/18 137 lb 12.6 oz (62.5 kg)  10/14/17 140 lb (63.5 kg)   BP Readings from Last 3 Encounters:  03/17/18 111/71  03/12/18 (!) 95/54  10/14/17 116/72   Pulse Readings from Last 3 Encounters:  03/17/18 72  03/12/18 62  10/14/17 60   BMI Readings from Last 3 Encounters:  03/17/18 22.26 kg/m  03/11/18 21.91 kg/m  10/14/17 21.93 kg/m     Patient Care Team    Relationship Specialty Notifications Start End  Joy Dance, DO PCP - General Family Medicine  01/06/17   Dorothy Spark, MD Consulting Physician Cardiology  01/06/17   Tanda Rockers, MD Consulting Physician Pulmonary Disease  01/06/17   Hurley Cisco, MD Consulting Physician Rheumatology  01/06/17   Lerry Paterson, MD Referring Physician   01/06/17    Comment: lung ca Surgeon  Lissa Morales, MD Referring Physician Internal Medicine  01/06/17    Comment: Oncologist- Chemo doc  Joy Basques, MD Consulting Physician Infectious Diseases  03/17/18    Comment: + RPR test     Patient Active Problem List    Diagnosis Date Noted  . Tobacco use disorder 01/22/2017    Priority: High  . Tobacco abuse counseling 01/22/2017    Priority: High  . Chronic obstructive pulmonary disease ([place])     Priority: High  . 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 Malignant neoplasm of left lung ([place]) 12/30/2014    Priority: High  . Malignant neoplasm of overlapping sites of left lung ([place]) 12/06/2014    Priority: High  . Scleroderma ([place]) 10/03/2014    Priority: High  . Age-related osteoporosis without current pathological fracture 02/20/2017    Priority: Medium  . Estrogen deficiency 01/14/2017    Priority: Medium  . H/O cardiac catheterization- in prep for lung ca sx 01/06/2017    Priority: Medium  . Hypothyroidism 01/06/2017    Priority: Medium  . 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 Chronic diastolic congestive heart failure ([place])     Priority: Medium  . Asthmatic bronchitis , chronic ([place]) 10/03/2014  Priority: Medium  . Current Cigarette smoker 10/03/2014    Priority: Medium  . 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 Neoplastic malignant related fatigue 01/22/2017    Priority: Low  . Vitamin D insufficiency 01/22/2017    Priority: Low  . Previous back surgeries 01/06/2017    Priority: Low  . Antineoplastic chemotherapy induced anemia 05/05/2015    Priority: Low  . Raynaud's disease 01/19/2015    Priority: Low  . Severe mitral regurgitation 11/15/2014    Priority: Low  . Multiple pulmonary nodules 10/03/2014    Priority: Low  . Complicated migraine 95/63/8756  . Hyperlipidemia LDL goal <70 03/12/2018  . 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 Stroke-like episode ([place]) s/p tPA 03/11/2018  . Dysuria 10/14/2017  . Abnormal finding on urinalysis 10/14/2017  .  Pharyngitis 10/14/2017  . ASCUS of cervix with negative high risk HPV 01/29/2017  . History of epistaxis 03/30/2015  . Lung cancer ([place]) 12/30/2014  . Nonintractable episodic headache 12/06/2014    Past Medical history, Surgical history, Family history, Social history, Allergies and Medications have been entered into the medical record, reviewed and changed as needed.    Current Meds  Medication Sig  . acetaminophen (TYLENOL) 500 MG tablet Take 1,000 mg by mouth every 6 (six) hours as needed for mild pain or headache.  Marland Kitchen aspirin EC 81 MG tablet Take 1 tablet (81 mg total) by mouth daily for 21 days. Stop after 21 days and continue plavix alone (be sure to continue aspirin if you decide not to take the plavix)  . atorvastatin (LIPITOR) 20 MG tablet Take 1 tablet (20 mg total) by mouth daily at 6 PM.  . budesonide-formoterol (SYMBICORT) 80-4.5 MCG/ACT inhaler Inhale 2 puffs into the lungs 2 (two) times daily.  . Cholecalciferol (VITAMIN D3) 5000 units TABS 5,000 IU OTC vitamin D3 daily.  . clopidogrel (PLAVIX) 75 MG tablet Take 1 tablet (75 mg total) by mouth daily.  Marland Kitchen levothyroxine (SYNTHROID, LEVOTHROID) 175 MCG tablet TAKEK1 TABLET BY MOUTH DAILY**PATIENT NEEDS OFFICE VISIT FOR FURTHER REFILLS** (Patient taking differently: Take 175 mcg by mouth daily before breakfast. )  . Multiple Vitamins-Minerals (MULTIVITAMIN WITH MINERALS) tablet Take 1 tablet by mouth daily.  . naproxen sodium (ALEVE) 220 MG tablet Take 220 mg by mouth as needed (pain).  . Turmeric 500 MG CAPS Take 500 mg by mouth daily.    Allergies:  Allergies  Allergen Reactions  . Penicillins Hives    Unknown, occurred as a child Has patient had a PCN reaction causing immediate rash, facial/tongue/throat swelling, SOB or lightheadedness with hypotension: Yes Has patient had a PCN reaction causing severe rash involving mucus membranes or skin necrosis: No Has patient had a PCN reaction that required hospitalization  Has  patient had a PCN reaction occurring within the last 10 years: No If all of the above answers are "NO", then may proceed with Cephalosporin use. No     Review of Systems:  A fourteen system review of systems was performed and found to be positive as per HPI.   Objective:   Blood pressure 111/71, pulse 72, resp. rate 17, weight 140 lb (63.5 kg), SpO2 98 %. Body mass index is 22.26 kg/m. General:  Well Developed, well nourished, appropriate for stated age.  Neuro:  Alert and oriented,  extra-ocular muscles intact  HEENT:  Normocephalic, atraumatic, neck supple, no carotid bruits appreciated  Skin:  no gross rash, warm, pink. Cardiac:  RRR, S1 S2 Respiratory:  ECTA B/L and A/P, Not using accessory muscles, speaking in full sentences- unlabored. Vascular:  Ext warm, no cyanosis apprec.; cap RF less 2 sec. Psych:  No HI/SI, judgement and insight good, Euthymic mood. Full Affect.

## 2018-03-25 ENCOUNTER — Telehealth: Payer: Self-pay | Admitting: Family Medicine

## 2018-03-25 NOTE — Telephone Encounter (Signed)
Patient is requesting to speak with nurse prior to sch flu shot, she is not sure given her previous medical admission and medical problems she is noy sure if she can get it or not. Please advise

## 2018-03-25 NOTE — Telephone Encounter (Signed)
Patient is wondering if she needs ID referral, she couldn't remember given her prior medical condition if that was something needed. Please advise

## 2018-03-26 NOTE — Telephone Encounter (Signed)
Per Dr. Raliegh Scarlet okay to refer patient or patient can follow up with the [facility]. Patient states that she will like to wait on referral at the moment and will call back if she decides to go forward with referral. MPulliam, CMA/RT(R)

## 2018-03-26 NOTE — Telephone Encounter (Signed)
Per Dr. Raliegh Scarlet patient may have flu vaccine as long as she is feeling well, no fever or chills, no productive cough, and no new respiratory. Patient notified. MPulliam, CMA/RT(R)

## 2018-04-10 ENCOUNTER — Other Ambulatory Visit: Payer: Self-pay | Admitting: Family Medicine

## 2018-04-14 ENCOUNTER — Encounter: Payer: Self-pay | Admitting: Adult Health

## 2018-04-14 ENCOUNTER — Ambulatory Visit (INDEPENDENT_AMBULATORY_CARE_PROVIDER_SITE_OTHER): Payer: Medicare Other | Admitting: Adult Health

## 2018-04-14 VITALS — BP 90/58 | HR 72 | Ht 66.5 in | Wt 137.6 lb

## 2018-04-14 DIAGNOSIS — I951 Orthostatic hypotension: Secondary | ICD-10-CM

## 2018-04-14 DIAGNOSIS — R2990 Unspecified symptoms and signs involving the nervous system: Secondary | ICD-10-CM

## 2018-04-14 DIAGNOSIS — I639 Cerebral infarction, unspecified: Secondary | ICD-10-CM | POA: Diagnosis not present

## 2018-04-14 DIAGNOSIS — E785 Hyperlipidemia, unspecified: Secondary | ICD-10-CM

## 2018-04-14 NOTE — Progress Notes (Signed)
[facility] [address] [place]. [place] [address] [phone]       OFFICE FOLLOW UP NOTE  [name] Date of Birth:  10/06/1950 Medical Record Number:  [id number]   Reason for Referral:  hospital stroke follow up  CHIEF COMPLAINT:  Chief Complaint  Patient presents with  . Hospitalization Follow-up    Hospital follow up. Alone. Rm 9. Patient stated that since being home from the hospital she days where she is light headed and feels pressure at the top of her head.    HPI: Joy Williams is being seen today for initial visit in the office for stroke like episode s/p TPA versus complicated migraine on 73/71/0626. History obtained from patient and chart review. Reviewed all radiology images and labs personally.  Joy Williams an 67 y.o.femalepast medical history of lung cancer status post resection and chemotherapy, mitral valve regurgitation status post mitral valve replacement, Raynaud's disease, telangiectasia, COPD, chronic diastolic heart failure who was admitted with sudden onset vertigo, headache and gait imbalance after gettingoutfrom the shower.  CT had reviewed and was negative for acute infarct or hemorrhage.  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  Per notes, it was felt as though ICA proximal occlusion was likely due to artifact after images are further reviewed.  MRI brain reviewed and was negative for acute infarct.  Carotid Doppler showed bilateral ICA 1 to 39% stenosis with VAs antegrade.  2D echo showed an EF of 55 to 60% without cardiac source of embolus identified.  Patient was on aspirin 81 mg PTA and was recommended DAPT for 3 weeks and then Plavix alone but per notes, patient was hesitant on continuing Plavix long-term.  LDL 108 and  recommended initiating statin for HLD management.  All symptoms resolved prior to discharge and was unable to be determined if episode related to TIA versus aborted stroke versus complicated migraine.  Patient was discharged home in stable condition.  Patient is being seen today for hospital follow-up.  She states she continues to have occasional headaches with dizziness that occurs approximately 2-3 times per week where upon standing she will feel a strong rash of fullness sensation in her head with a dizziness sensation that lasts approximately 1 minute.  She will drink water at that time and the sensation subsides.  She states this been occurring for approximately 10 years now.  The symptoms she was feeling when she was admitted to the hospital with the same type of symptoms but much stronger than what she is used to.  As patient was called back by nurse from lobby to exam room, patient apparently had 1 of these episodes with loss of balance and headache sensation.  Orthostatics obtained initially with lying down 123/63, sitting 101/62 and standing 99/58.  Obtained sitting and standing pressures after patient sitting in chair for a while during visit with sitting pressure 110/70 and standing pressure 90/58.  At the end of the visit, patient had symptoms subsided without continued dizziness, pressure sensation or headache.  She does states she continues on Plavix without side effects of bleeding or bruising as she has completed 3 weeks DAPT.  Continues on atorvastatin without side effects myalgias.  Patient has returned back to all prior activities.  Denies new or worsening stroke/TIA symptoms.   ROS:   14 system review  of systems performed and negative with exception of headache and dizziness  PMH:  Past Medical History:  Diagnosis Date  . Allergy   . Asthma    pt denies this, but is on Dulera  . Chronic diastolic congestive heart failure ([place])   . 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 COPD (chronic obstructive pulmonary disease) ([place])   . Family history of adverse reaction to anesthesia    Mother- very sensitive to medication  . Heart murmur    MVP  . Hypothyroidism   . 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 MVP (mitral valve prolapse)   . PONV (postoperative nausea and vomiting)   . Raynaud's disease    Raynaud's  . Raynaud's syndrome   . 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 Severe mitral regurgitation 11/15/2014  . Shortness of breath dyspnea    with exertion  . STD (sexually transmitted disease)   . Telangiectasia   . Thyroid disease     PSH:  Past Surgical History:  Procedure Laterality Date  . BACK SURGERY     x 3  Disectomy  . CARDIAC CATHETERIZATION N/A 03/08/2016   Procedure: Right/Left Heart Cath and Coronary Angiography;  Surgeon: Sherren Mocha, MD;  Location: [place] [facility];  Service: Cardiovascular;  Laterality: N/A;  . CLAVICLE SURGERY Left 2013   plate to left collar bone  . LAPAROSCOPY     ? reason-age 54   . LUNG CANCER SURGERY    . MITRAL VALVE REPAIR Right 06/27/2016   Procedure: MINIMALLY INVASIVE MITRAL VALVE REPLACEMENT;  Surgeon: Rexene Alberts, MD;  Location: [place];  Service: Open Heart Surgery;  Laterality: Right;  . TEE WITHOUT CARDIOVERSION N/A 02/22/2016   Procedure: TRANSESOPHAGEAL ECHOCARDIOGRAM (TEE);  Surgeon: Dorothy Spark, MD;  Location: [place];  Service: Cardiovascular;  Laterality: N/A;  . TEE WITHOUT CARDIOVERSION N/A 06/27/2016   Procedure: TRANSESOPHAGEAL ECHOCARDIOGRAM (TEE);  Surgeon: Rexene Alberts, MD;  Location:  [place];  Service: Open Heart Surgery;  Laterality: N/A;  . TONSILLECTOMY    . VIDEO ASSISTED THORACOSCOPY (VATS)/WEDGE RESECTION Left 12/30/2014   Bronchoscopy, Mediastinoscopy, Left VATS for Wedge resection LUL x2 adn LLL x1 - Dr. Elenor Quinones at [facility]  . VOCAL CORD INJECTION Left 2017   injected with botox  . wrist surgery Left 2015   plate to wrist     Social History:  Social History   Socioeconomic History  . Marital status: Divorced    Spouse name: Not on file  . Number of children: Not on file  . Years of education: Not on file  . Highest education level: Not on file  Occupational History  . Not on file  Social Needs  . Financial resource strain: Not on file  . Food insecurity:    Worry: Not on file    Inability: Not on file  . Transportation needs:    Medical: Not on file    Non-medical: Not on file  Tobacco Use  . Smoking status: Current Some Day Smoker    Packs/day: 0.25    Years: 24.00  Pack years: 6.00    Types: E-cigarettes, Cigarettes  . Smokeless tobacco: Never Used  Substance and Sexual Activity  . Alcohol use: Yes    Alcohol/week: 2.0 standard drinks    Types: 1 Shots of liquor, 1 Standard drinks or equivalent per week  . Drug use: No  . Sexual activity: Never    Partners: Male    Birth control/protection: Post-menopausal  Lifestyle  . Physical activity:    Days per week: Not on file    Minutes per session: Not on file  . Stress: Not on file  Relationships  . Social connections:    Talks on phone: Not on file    Gets together: Not on file    Attends religious service: Not on file    Active member of club or organization: Not on file    Attends meetings of clubs or organizations: Not on file    Relationship status: Not on file  . Intimate partner violence:    Fear of current or ex partner: Not on file    Emotionally abused: Not on file    Physically abused: Not on file    Forced sexual activity: Not on file  Other Topics Concern  . Not on file    Social History Narrative  . Not on file    Family History:  Family History  Problem Relation Age of Onset  . Mitral valve prolapse Mother   . Dementia Father   . Prostate cancer Father   . Mitral valve prolapse Brother   . Mitral valve prolapse Sister   . Colon cancer Neg Hx   . Colon polyps Neg Hx   . Rectal cancer Neg Hx   . Esophageal cancer Neg Hx   . Stomach cancer Neg Hx   . Heart attack Neg Hx     Medications:   Current Outpatient Medications on File Prior to Visit  Medication Sig Dispense Refill  . acetaminophen (TYLENOL) 500 MG tablet Take 1,000 mg by mouth every 6 (six) hours as needed for mild pain or headache.    Marland Kitchen atorvastatin (LIPITOR) 20 MG tablet Take 1 tablet (20 mg total) by mouth daily at 6 PM. 30 tablet 2  . budesonide-formoterol (SYMBICORT) 80-4.5 MCG/ACT inhaler Inhale 2 puffs into the lungs 2 (two) times daily. 1 Inhaler 11  . Cholecalciferol (VITAMIN D3) 5000 units TABS 5,000 IU OTC vitamin D3 daily. 90 tablet 3  . clopidogrel (PLAVIX) 75 MG tablet Take 1 tablet (75 mg total) by mouth daily. 30 tablet 2  . levothyroxine (SYNTHROID, LEVOTHROID) 175 MCG tablet TAKE 1 TABLET BY MOUTH EVERY DAY 30 tablet 0  . Multiple Vitamins-Minerals (MULTIVITAMIN WITH MINERALS) tablet Take 1 tablet by mouth daily.    . naproxen sodium (ALEVE) 220 MG tablet Take 220 mg by mouth as needed (pain).    . Turmeric 500 MG CAPS Take 500 mg by mouth daily.     No current facility-administered medications on file prior to visit.     Allergies:   Allergies  Allergen Reactions  . Penicillins Hives    Unknown, occurred as a child Has patient had a PCN reaction causing immediate rash, facial/tongue/throat swelling, SOB or lightheadedness with hypotension: Yes Has patient had a PCN reaction causing severe rash involving mucus membranes or skin necrosis: No Has patient had a PCN reaction that required hospitalization  Has patient had a PCN reaction occurring within the last 10  years: No If all of the above answers are "NO", then  may proceed with Cephalosporin use. No     Physical Exam  Vitals:   04/14/18 1031 04/14/18 1110 04/14/18 1112  BP: (!) 110/57 110/70 (!) 90/58  Pulse: 72    Weight: 137 lb 9.6 oz (62.4 kg)    Height: 5' 6.5" (1.689 m)     Body mass index is 21.88 kg/m. No exam data present   General: well developed, well nourished, pleasant middle-aged Caucasian female, seated, in no evident distress Head: head normocephalic and atraumatic.   Neck: supple with no carotid or supraclavicular bruits Cardiovascular: regular rate and rhythm, no murmurs Musculoskeletal: no deformity Skin:  no rash/petichiae Vascular:  Normal pulses all extremities  Neurologic Exam Mental Status: Awake and fully alert. Oriented to place and time. Recent and remote memory intact. Attention span, concentration and fund of knowledge appropriate. Mood and affect appropriate.  Cranial Nerves: Fundoscopic exam reveals sharp disc margins. Pupils equal, briskly reactive to light. Extraocular movements full without nystagmus. Visual fields full to confrontation. Hearing intact. Facial sensation intact. Face, tongue, palate moves normally and symmetrically.  Motor: Normal bulk and tone. Normal strength in all tested extremity muscles. Sensory.: intact to touch , pinprick , position and vibratory sensation.  Coordination: Rapid alternating movements normal in all extremities. Finger-to-nose and heel-to-shin performed accurately bilaterally. Gait and Station: Arises from chair without difficulty. Stance is normal. Gait demonstrates normal stride length and balance. Able to heel, toe and tandem walk without difficulty.  Romberg negative. Reflexes: 1+ and symmetric. Toes downgoing.    NIHSS  0 Modified Rankin  0    Diagnostic Data (Labs, Imaging, Testing)  CT HEAD WO CONTRAST 03/11/2018 IMPRESSION: 1. Small circumscribed hypoechoic focus in the left caudate head which  may represent a late subacute or remote lacunar infarct. 2. Otherwise, no acute intracranial abnormality.  CT ANGIO HEAD W OR WO CONTRAST CT ANGIO NECK W OR WO CONTRAST 03/11/2018 IMPRESSION: Occlusive soft plaque/thrombus, proximal RIGHT ICA. No measurable lumen. Good opacification of the RIGHT ICA more distally, although unclear how antegrade flow is accomplished. No distal emboli in the RIGHT anterior intracranial circulation. Nonstenotic plaque LEFT carotid bifurcation.  MR BRAIN WO CONTRAST 03/12/2018 IMPRESSION: No acute intracranial finding. Mild chronic small-vessel ischemic change of the cerebellum and left thalamus.  ECHOCARDIOGRAM 03/12/2018 Impressions: - No cardiac source of emboli was indentified. Normal LV EF, grade   2 diastolic dysfunction. Bioprosthetic mitral valve appears in   good posiion with a trivial gradient.    ASSESSMENT: Joy Williams is a 67 y.o. year old female here with strokelike episode s/p TPA versus complicated migraine on 00/93/8182. Vascular risk factors include lung cancer s/p resection and chemotherapy, mitral valve regurgitation s/p mitral valve replacement, Raynard's disease, COPD, chronic diastolic heart failure and HLD.  Patient is being seen today for hospital follow-up and does continue to have 2-3 times per week symptoms most likely related to orthostatic hypotension but otherwise has been stable from a stroke standpoint.    PLAN:  1. Strokelike episode s/p TPA: Continue clopidogrel 75 mg daily  and atorvastatin for secondary stroke prevention. Maintain strict control of hypertension with blood pressure goal below 130/90, diabetes with hemoglobin A1c goal below 6.5% and cholesterol with LDL cholesterol (bad cholesterol) goal below 70 mg/dL.  I also advised the patient to eat a healthy diet with plenty of whole grains, cereals, fruits and vegetables, exercise regularly with at least 30 minutes of continuous activity daily and maintain  ideal body weight. 2. Likely orthostatic hypotension: Long discussion regarding  conservative measures to avoid symptoms such as compression stockings, increasing fluid intake and moderately increased sodium intake along with ensuring she stands slowly and not to start ambulating until symptoms resolve. 3. HLD: Advised to continue current treatment regimen along with continued follow-up with PCP for future prescribing and monitoring of lipid panel     Follow up in 3 months or call earlier if needed   Greater than 50% of time during this 25 minute visit was spent on counseling, explanation of diagnosis of strokelike episode, reviewing risk factor management of HLD, planning of further management along with potential future management, and discussion with patient and family answering all questions.    Venancio Poisson, AGNP-BC  [facility] [address] [place], [place] [address]  Phone [phone] Fax [phone] Note: This document was prepared with digital dictation and possible smart phrase technology. Any transcriptional errors that result from this process are unintentional.

## 2018-04-14 NOTE — Progress Notes (Signed)
I agree with the above plan 

## 2018-04-14 NOTE — Patient Instructions (Signed)
Continue clopidogrel 75 mg daily  and lipitor  for secondary stroke prevention  Continue to follow up with PCP regarding cholesterol and blood pressure management   Orthostatic hypotension - use compression stockings along with increase fluid and sodium intake. Ensure you get up slowly when sitting from standing to avoid dizziness and increased pressure sensation. If you continue to experience symptoms, please let us know  If this continues, we can consider doing physical therapy  Continue to monitor blood pressure at home  Maintain strict control of hypertension with blood pressure goal below 130/90, diabetes with hemoglobin A1c goal below 6.5% and cholesterol with LDL cholesterol (bad cholesterol) goal below 70 mg/dL. I also advised the patient to eat a healthy diet with plenty of whole grains, cereals, fruits and vegetables, exercise regularly and maintain ideal body weight.  Followup in the future with me in 3 months or call earlier if needed       Thank you for coming to see [place] at [facility]. I hope we have been able to provide you high quality care today.  You may receive a patient satisfaction survey over the next few weeks. We would appreciate your feedback and comments so that we may continue to improve ourselves and the health of our patients.

## 2018-04-27 ENCOUNTER — Ambulatory Visit (INDEPENDENT_AMBULATORY_CARE_PROVIDER_SITE_OTHER): Payer: Medicare Other | Admitting: Family Medicine

## 2018-04-27 ENCOUNTER — Encounter: Payer: Self-pay | Admitting: Family Medicine

## 2018-04-27 VITALS — BP 100/67 | HR 63 | Temp 98.2°F | Ht 67.0 in | Wt 135.0 lb

## 2018-04-27 DIAGNOSIS — Z780 Asymptomatic menopausal state: Secondary | ICD-10-CM | POA: Insufficient documentation

## 2018-04-27 DIAGNOSIS — Z0100 Encounter for examination of eyes and vision without abnormal findings: Secondary | ICD-10-CM

## 2018-04-27 DIAGNOSIS — Z23 Encounter for immunization: Secondary | ICD-10-CM

## 2018-04-27 DIAGNOSIS — M810 Age-related osteoporosis without current pathological fracture: Secondary | ICD-10-CM

## 2018-04-27 DIAGNOSIS — I639 Cerebral infarction, unspecified: Secondary | ICD-10-CM

## 2018-04-27 DIAGNOSIS — Z79899 Other long term (current) drug therapy: Secondary | ICD-10-CM | POA: Insufficient documentation

## 2018-04-27 DIAGNOSIS — Z820 Family history of epilepsy and other diseases of the nervous system: Secondary | ICD-10-CM | POA: Diagnosis not present

## 2018-04-27 DIAGNOSIS — Z1211 Encounter for screening for malignant neoplasm of colon: Secondary | ICD-10-CM

## 2018-04-27 DIAGNOSIS — E785 Hyperlipidemia, unspecified: Secondary | ICD-10-CM | POA: Diagnosis not present

## 2018-04-27 DIAGNOSIS — R2990 Unspecified symptoms and signs involving the nervous system: Secondary | ICD-10-CM

## 2018-04-27 DIAGNOSIS — Z0000 Encounter for general adult medical examination without abnormal findings: Secondary | ICD-10-CM

## 2018-04-27 DIAGNOSIS — Z1239 Encounter for other screening for malignant neoplasm of breast: Secondary | ICD-10-CM

## 2018-04-27 DIAGNOSIS — R413 Other amnesia: Secondary | ICD-10-CM | POA: Insufficient documentation

## 2018-04-27 MED ORDER — ZOSTER VAC RECOMB ADJUVANTED 50 MCG/0.5ML IM SUSR: 0.5000 mL | Freq: Once | INTRAMUSCULAR | 0 refills | Status: AC

## 2018-04-27 MED ORDER — PNEUMOCOCCAL VAC POLYVALENT 25 MCG/0.5ML IJ INJ: 0.5000 mL | INJECTION | Freq: Once | INTRAMUSCULAR | 0 refills | Status: AC

## 2018-04-27 NOTE — Progress Notes (Deleted)
Impression and Recommendations:    1. Encounter for Medicare annual wellness exam   2. Screening for breast cancer   3. Need for shingles vaccine   4. Need for pneumococcal vaccination   5. Flu vaccine need   6. Screening for colon cancer   7. Vision test   8. On statin therapy   9. Hyperlipidemia LDL goal <70   10. Routine eye exam   11. 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 Age-related osteoporosis without current pathological fracture   13. Family history of Alzheimer's disease     - Need for lab work today.  - Make fasting blood work appointment in late February 2020, with follow-up appointment to review around March 1.  On Statin Therapy On 03/11/2018, her liver enzymes: AST - 29 ALT - 17  Lipid Panel 03/12/2018: LDL = elevated at 108.  1) Anticipatory Guidance: Discussed importance of wearing a seatbelt while driving, not texting while driving; sunscreen when outside along with yearly skin surveillance; eating a well balanced and modest diet; physical activity at least 25 minutes per day or 150 min/ week of moderate to intense activity.  - Recommended patient obtain hearing aids from [employer] to help with price.  Per pt, "I've gone through the testing, I know I need them; it was just a matter of when, and it is when."  - Strongly advised patient to obtain a wheeled walker to help assist her while standing from seated position.  Patient declines wheeled walker today and would prefer to try other techniques for assistance first, such as compression hose.  2) Immunizations / Screenings / Labs:  All immunizations and screenings that patient agrees to, are up-to-date per recommendations or will be updated today.  Patient understands the needs for q 51mo dental and yearly vision screens which pt will schedule independently. Obtain CBC, CMP, HgA1c, Lipid panel, TSH and vit D when fasting if not already done recently.   - Patient with need for liver enzyme re-check- Pt  was started on cholesterol medicine after stroke-like episode s/p tPA on 10/23; started on Lipitor when discharged from the hospital, and has not had her ALT re-checked.  - Last mammogram done in 2018.  Need for mammogram in 2019.  Encouraged patient to follow up with gynecology as recommended.  - TDAP up to date.  - Need for flu vaccine.  - Need for Shingles vaccine.  - Pneumonia vaccine last in 2018  - Referral placed to ophthalmology today.  - Patient has never had a colonoscopy.  Interested in [employer].  Educated patient at length about ColoGuard today, and possible need for colonoscopy for follow-up PRN.  3) DEXA Screening (Osteopenia) - DEXA up to date, last obtained in September 2018.  Encouraged patient to engage in weight-bearing exercises and lifting weights twice weekly.  Patient due for re-check in 2020.  Discussed that patient is at high-risk for osteoporosis with her h/o cancer treatment.  - Handout of information provided today regarding bone density medications, recommendations & guidelines.  Patient states "if it's once a week, I think I can handle it."  Agrees to try medication if indicated, and prescribed here.  - Encouraged patient to call and ask for further information regarding her Osteopenia.  4) Weight - Normal BMI (21.1) - Discussed that the thinner the patient is, the higher her risk for osteoporosis.  - Improve nutrient density of diet through increasing intake of fruits and vegetables and decreasing saturated/trans fats, white flour  products and refined sugar products.   5) Lifestyle & Preventative Health Maintenance: - Advised patient to continue working toward exercising to improve overall mental, physical, and emotional health.    American Heart Association guidelines for healthy diet, basically Mediterranean diet, and exercise guidelines of 30 minutes 5 days per week or more discussed in detail.  Health counseling performed.  All questions  answered.  - Reviewed the "spokes of the wheel" of mood and health management.  Stressed the importance of ongoing prudent habits, including regular exercise, appropriate sleep hygiene, healthful dietary habits, and prayer/meditation to relax.  - Discussed the importance of regular socialization and interaction with others.  - Encouraged patient to engage in daily physical activity, especially a formal exercise routine.  Recommended that the patient eventually strive for at least 150 minutes of moderate cardiovascular activity per week according to guidelines established by the [facility].   - Healthy dietary habits encouraged, including low-carb, and high amounts of lean protein in diet.   - Patient should also consume adequate amounts of water.   Meds ordered this encounter  Medications  . Zoster Vaccine Adjuvanted [facility]) injection    Sig: Inject 0.5 mLs into the muscle once for 1 dose.    Dispense:  0.5 mL    Refill:  0  . pneumococcal 23 valent vaccine (PNU-IMMUNE) 25 MCG/0.5ML injection    Sig: Inject 0.5 mLs into the muscle once for 1 dose.    Dispense:  0.5 mL    Refill:  0    Orders Placed This Encounter  Procedures  . Flu vaccine HIGH DOSE PF (Fluzone High dose)  . Cologuard  . ALT  . Comprehensive metabolic panel  . T4, free  . VITAMIN D 25 Hydroxy (Vit-D Deficiency, Fractures)  . Lipid Panel w/reflex Direct LDL  . TSH  . Sedimentation rate  . B12 and Folate Panel  . Ambulatory referral to Ophthalmology    Gross side effects, risk and benefits, and alternatives of medications discussed with patient.  Patient is aware that all medications have potential side effects and we are unable to predict every side effect or drug-drug interaction that may occur.  Expresses verbal understanding and consents to current therapy plan and treatment regimen.  F-up preventative CPE in 1 year. F/up sooner for chronic care management as discussed and/or prn.  Please see orders placed  and AVS handed out to patient at the end of our visit for further patient instructions/ counseling done pertaining to today's office visit.   This document serves as a record of services personally performed by Mellody Dance, DO. It was created on her behalf by Toni Amend, a trained medical scribe. The creation of this record is based on the scribe's personal observations and the provider's statements to them.   I have reviewed the above medical documentation for accuracy and completeness and I concur.  Mellody Dance, DO    Subjective:    Chief Complaint  Patient presents with  . Medicare Wellness   CC:   HPI: Joy Williams is a 67 y.o. female who presents to [place] [facility] today a yearly health maintenance exam.  Health Maintenance Summary Reviewed and updated, unless pt declines services.  Colonoscopy:  Has never had a colonoscopy. Tobacco History Reviewed:   Y; current some day smoker, 0.25 ppd, 6 pack-years. CT Scan for Lung CA:  Patient with history of lung CA; not a 30 pack-year history. Alcohol:  Does not drink alcohol. Exercise Habits:  Not meeting AHA guidelines.  Has tried to get between 3,000-5,000 steps per day.  Has not been going to the gym. STD concerns:  None reported; no significant others. Drug Use:  None reported. Birth control method:  N/a, post menopausal. Menses regular:   N/a, post menopausal. Lumps or breast concerns:   No.  Mammograms up to date. Breast Cancer Family History:   No Bone/ DEXA scan:  Last DEXA was in September 2018.  Notes that she had a very good Thanksgiving.  Denies concerns about mood.  Family History of Memory Issues Mother had Alzheimer's. Father had dementia.  Went to Neurology recently.  Says "I'm healthy."  Positional Blood Pressure Shifts/Positional Vertigo Has issues with her blood pressure from sitting to standing. She was advised to use pressure socks but has not done  this.  Managed on Cholesterol Medication after stroke-like episode s/p tPA 10/23 Hasn't been on the Lipitor long enough for another check. Patient started the Lipitor around 03/11/2018.  Patient does not drink alcohol.  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 Notes that there are days her tinnitus is gone, and days it "rears its ugly head." It is not continuous now, but it comes back when she eats spicy foods.  Feels she needs hearing aids.  "I've had my hearing checked and I need 'em."  Notes "I've gone through the testing, I know I need them; it was just a matter of when, and it is when."

## 2018-04-27 NOTE — Progress Notes (Signed)
Subjective:   Joy Williams is a 67 y.o. female who presents for Medicare Annual (Subsequent) preventive examination.   Colonoscopy:  Has never had a colonoscopy. Tobacco History Reviewed:   Y; current some day smoker, 0.25 ppd, 6 pack-years. CT Scan for Lung CA:  Patient with history of lung CA; not a 30 pack-year history. Alcohol:  Does not drink alcohol. Exercise Habits:  Not meeting AHA guidelines.  Has tried to get between 3,000-5,000 steps per day.  Has not been going to the gym. STD concerns:  None reported; no significant others. Drug Use:  None reported. Birth control method:  N/a, post menopausal. Menses regular:   N/a, post menopausal. Lumps or breast concerns:   No.  Mammograms up to date. Breast Cancer Family History:   No Bone/ DEXA scan:  Last DEXA was in September 2018.  Notes that she had a very good Thanksgiving.  Denies concerns about mood.  Family History of Memory Issues Mother had Alzheimer's. Father had dementia.  Went to Neurology recently.  Says "I'm healthy."  Positional Blood Pressure Shifts/Positional Vertigo Has issues with her blood pressure from sitting to standing. She was advised to use pressure socks but has not done this.  Managed on Cholesterol Medication after stroke-like episode s/p tPA 10/23 Hasn't been on the Lipitor long enough for another check. Patient started the Lipitor around 03/11/2018.  Patient does not drink alcohol.  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 Notes that there are days her tinnitus is gone, and days it "rears its ugly head." It is not continuous now, but it comes back when she eats spicy foods.  Feels she needs hearing aids.  "I've had my hearing checked and I need 'em."  Notes "I've gone through the testing, I know I need them; it was just a matter of when, and it is when."     Objective:     Vitals: BP 100/67   Pulse 63   Temp 98.2 F (36.8 C)   Ht 5\' 7"  (1.702 m)   Wt  135 lb (61.2 kg)   SpO2 98%   BMI 21.14 kg/m   Body mass index is 21.14 kg/m.  Advanced Directives 03/11/2018 03/11/2018 05/24/2017 01/06/2017 08/27/2016 06/28/2016 06/24/2016  Does Patient Have a Medical Advance Directive? Yes No Yes Yes Yes Yes No  Type of Advance Directive Living will - Living will [place];Living will;Out of facility DNR (pink MOST or yellow form) - [place];Living will -  Does patient want to make changes to medical advance directive? No - Patient declined - - - No - Patient declined No - Patient declined -  Would patient like information on creating a medical advance directive? - No - Patient declined - - - - -    Tobacco Social History   Tobacco Use  Smoking Status Current Some Day Smoker  . Packs/day: 0.25  . Years: 24.00  . Pack years: 6.00  . Types: E-cigarettes, Cigarettes  Smokeless Tobacco Never Used     Ready to quit: Not Answered Counseling given: Yes   Past Medical History:  Diagnosis Date  . Allergy   . Asthma    pt denies this, but is on Dulera  . Chronic diastolic congestive heart failure ([place])   . 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 COPD (chronic obstructive pulmonary disease) (  [place])   . Family history of adverse reaction to anesthesia    Mother- very sensitive to medication  . Heart murmur    MVP  . Hypothyroidism   . 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 MVP (mitral valve prolapse)   . PONV (postoperative nausea and vomiting)   . Raynaud's disease    Raynaud's  . Raynaud's syndrome   . 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 Severe mitral regurgitation 11/15/2014  . Shortness of breath dyspnea    with exertion  . STD (sexually transmitted disease)   . Telangiectasia   . Thyroid disease    Past Surgical History:  Procedure Laterality Date  . BACK SURGERY     x 3  Disectomy  . CARDIAC CATHETERIZATION N/A 03/08/2016   Procedure: Right/Left Heart Cath and Coronary Angiography;  Surgeon: Sherren Mocha, MD;  Location: [place] [facility];  Service: Cardiovascular;  Laterality: N/A;  . CLAVICLE SURGERY Left 2013   plate to left collar bone  . LAPAROSCOPY     ? reason-age 89   . LUNG CANCER SURGERY    . MITRAL VALVE REPAIR Right 06/27/2016   Procedure: MINIMALLY INVASIVE MITRAL VALVE REPLACEMENT;  Surgeon: Rexene Alberts, MD;  Location: [place];  Service: Open Heart Surgery;  Laterality: Right;  . TEE WITHOUT CARDIOVERSION N/A 02/22/2016   Procedure: TRANSESOPHAGEAL ECHOCARDIOGRAM (TEE);  Surgeon: Dorothy Spark, MD;  Location: [place];  Service: Cardiovascular;  Laterality: N/A;  . TEE WITHOUT CARDIOVERSION N/A 06/27/2016   Procedure: TRANSESOPHAGEAL ECHOCARDIOGRAM (TEE);  Surgeon: Rexene Alberts, MD;  Location: [place];  Service: Open Heart Surgery;  Laterality: N/A;  . TONSILLECTOMY    . VIDEO ASSISTED THORACOSCOPY (VATS)/WEDGE RESECTION Left 12/30/2014   Bronchoscopy, Mediastinoscopy, Left VATS for Wedge resection LUL x2 adn LLL x1 - Dr. Elenor Quinones at [facility]  . VOCAL CORD INJECTION Left 2017   injected with botox  . wrist surgery Left 2015   plate to wrist    Family History  Problem Relation Age of Onset  . Mitral valve prolapse Mother   . Dementia Father   . Prostate cancer Father   . Mitral valve prolapse Brother   . Mitral valve prolapse Sister   . Colon cancer Neg Hx   . Colon polyps Neg Hx   . Rectal cancer Neg Hx   . Esophageal cancer Neg Hx   . Stomach cancer Neg Hx   . Heart attack Neg Hx    Social History   Socioeconomic History  . Marital status: Divorced     Spouse name: Not on file  . Number of children: Not on file  . Years of education: Not on file  . Highest education level: Not on file  Occupational History  . Not on file  Social Needs  . Financial resource strain: Not on file  . Food insecurity:    Worry: Not on file    Inability: Not on file  . Transportation needs:    Medical: Not on file    Non-medical: Not on file  Tobacco Use  . Smoking status: Current Some Day Smoker    Packs/day: 0.25    Years: 24.00    Pack years:  6.00    Types: E-cigarettes, Cigarettes  . Smokeless tobacco: Never Used  Substance and Sexual Activity  . Alcohol use: Yes    Alcohol/week: 2.0 standard drinks    Types: 1 Shots of liquor, 1 Standard drinks or equivalent per week  . Drug use: No  . Sexual activity: Never    Partners: Male    Birth control/protection: Post-menopausal  Lifestyle  . Physical activity:    Days per week: Not on file    Minutes per session: Not on file  . Stress: Not on file  Relationships  . Social connections:    Talks on phone: Not on file    Gets together: Not on file    Attends religious service: Not on file    Active member of club or organization: Not on file    Attends meetings of clubs or organizations: Not on file    Relationship status: Not on file  Other Topics Concern  . Not on file  Social History Narrative  . Not on file    Outpatient Encounter Medications as of 04/27/2018  Medication Sig  . acetaminophen (TYLENOL) 500 MG tablet Take 1,000 mg by mouth every 6 (six) hours as needed for mild pain or headache.  Marland Kitchen atorvastatin (LIPITOR) 20 MG tablet Take 1 tablet (20 mg total) by mouth daily at 6 PM.  . budesonide-formoterol (SYMBICORT) 80-4.5 MCG/ACT inhaler Inhale 2 puffs into the lungs 2 (two) times daily.  . Cholecalciferol (VITAMIN D3) 5000 units TABS 5,000 IU OTC vitamin D3 daily.  . clopidogrel (PLAVIX) 75 MG tablet Take 1 tablet (75 mg total) by mouth daily.  Marland Kitchen levothyroxine (SYNTHROID,  LEVOTHROID) 175 MCG tablet TAKE 1 TABLET BY MOUTH EVERY DAY  . Multiple Vitamins-Minerals (MULTIVITAMIN WITH MINERALS) tablet Take 1 tablet by mouth daily.  . naproxen sodium (ALEVE) 220 MG tablet Take 220 mg by mouth as needed (pain).  . Turmeric 500 MG CAPS Take 500 mg by mouth daily.  . [EXPIRED] pneumococcal 23 valent vaccine (PNU-IMMUNE) 25 MCG/0.5ML injection Inject 0.5 mLs into the muscle once for 1 dose.  . [EXPIRED] Zoster Vaccine Adjuvanted [facility]) injection Inject 0.5 mLs into the muscle once for 1 dose.   No facility-administered encounter medications on file as of 04/27/2018.     Activities of Daily Living In your present state of health, do you have any difficulty performing the following activities: 04/27/2018 03/11/2018  Hearing? Y N  Vision? N N  Difficulty concentrating or making decisions? N N  Walking or climbing stairs? N N  Dressing or bathing? N N  Doing errands, shopping? N N  Some recent data might be hidden   Activities of Daily Living In your present state of health, do you have difficulty performing the following activities?  1- Driving - no 2- Managing money - no 3- Feeding yourself - no 4- Getting from the bed to the chair - no 5- Climbing a flight of stairs - no 6- Preparing food and eating - no 7- Bathing or showering - no 8- Getting dressed - no 9- Getting to the toilet - no 10- Using the toilet - no 11- Moving around from place to place - no  Patient states that she feels safe at home. Functional Status Survey: Is the patient deaf or have difficulty hearing?: Yes Does the patient have difficulty seeing, even when wearing glasses/contacts?: No Does the patient have difficulty concentrating, remembering, or making decisions?: No Does the patient have difficulty walking or climbing stairs?: No  Does the patient have difficulty dressing or bathing?: No Does the patient have difficulty doing errands alone such as visiting a doctor's office or  shopping?: No  Patient Care Team: Mellody Dance, DO as PCP - General (Family Medicine) Dorothy Spark, MD as Consulting Physician (Cardiology) Tanda Rockers, MD as Consulting Physician (Pulmonary Disease) Hurley Cisco, MD as Consulting Physician (Rheumatology) Lerry Paterson, MD as Referring Physician Lissa Morales, MD as Referring Physician (Internal Medicine) [name], MD as Consulting Physician (Infectious Diseases)    Assessment:   This is a routine wellness examination for Joy Williams.  Exercise Activities and Dietary recommendations Current Exercise Habits: Home exercise routine, Type of exercise: walking, Frequency (Times/Week): 7(3,000 to 5.000 steps daily ), Intensity: Moderate  Goals   None     Fall Risk Fall Risk  04/27/2018 01/22/2017 01/06/2017 08/27/2016  Falls in the past year? 0 No No No   Is the patient's home free of loose throw rugs in walkways, pet beds, electrical cords, etc?   yes      Grab bars in the bathroom? no      Handrails on the stairs?   patient has no stairs in the home      Adequate lighting?   yes  Timed Get Up and Go performed: passed  Depression Screen PHQ 2/9 Scores 04/27/2018 03/17/2018 10/14/2017 01/22/2017  PHQ - 2 Score 0 1 0 0  PHQ- 9 Score 0 5 0 0    Depression screen [facility] 2/9 04/27/2018 03/17/2018 10/14/2017 01/22/2017 01/22/2017  Decreased Interest 0 1 0 0 0  Down, Depressed, Hopeless 0 0 0 0 0  PHQ - 2 Score 0 1 0 0 0  Altered sleeping 0 0 0 0 -  Tired, decreased energy 0 2 0 0 -  Change in appetite 0 0 0 0 -  Feeling bad or failure about yourself  0 0 0 0 -  Trouble concentrating 0 1 0 0 -  Moving slowly or fidgety/restless 0 1 0 0 -  Suicidal thoughts 0 0 0 0 -  PHQ-9 Score 0 5 0 0 -  Difficult doing work/chores Not difficult at all Somewhat difficult - Not difficult at all -    Cognitive Function     6CIT Screen 04/27/2018 01/22/2017  What Year? 0 points 0 points  What month? 0 points 0 points  What time?  0 points 0 points  Count back from 20 0 points 0 points  Months in reverse 0 points 0 points  Repeat phrase 2 points 0 points  Total Score 2 0    Immunization History  Administered Date(s) Administered  . Influenza Split 02/18/2015  . Influenza, High Dose Seasonal PF 02/20/2017, 04/27/2018  . Influenza,inj,Quad PF,6+ Mos 03/16/2015  . Pneumococcal Conjugate-13 01/22/2017  . Tdap 09/26/2015    Qualifies for Shingles Vaccine? Yes - sent to the pharmacy  Screening Tests Health Maintenance  Topic Date Due  . PNA vac Low Risk Adult (2 of 2 - PPSV23) 01/22/2018  . COLONOSCOPY  04/28/2019 (Originally 02/10/2001)  . MAMMOGRAM  01/31/2019  . TETANUS/TDAP  09/25/2025  . INFLUENZA VACCINE  Completed  . DEXA SCAN  Completed  . Hepatitis C Screening  Completed    Cancer Screenings: Lung: Low Dose CT Chest recommended if Age 63-80 years, 30 pack-year currently smoking OR have quit w/in 15years. Patient does not qualify. Breast:  Up to date on Mammogram? No Order placed  Bone Density/Dexa? Yes 01/22/2017 Colorectal: Patient has never had and declines  at this time  Additional Screenings: : Hepatitis C Screening:      Plan:      - Need for lab work today.  - Make fasting blood work appointment in late February 2020, with follow-up appointment to review around March 1.  On Statin Therapy On 03/11/2018, her liver enzymes: AST - 29 ALT - 17  Lipid Panel 03/12/2018: LDL = elevated at 108.  1) Anticipatory Guidance: Discussed importance of wearing a seatbelt while driving, not texting while driving; sunscreen when outside along with yearly skin surveillance; eating a well balanced and modest diet; physical activity at least 25 minutes per day or 150 min/ week of moderate to intense activity.  - Recommended patient obtain hearing aids from [employer] to help with price.  Per pt, "I've gone through the testing, I know I need them; it was just a matter of when, and it is when."  -  Strongly advised patient to obtain a wheeled walker to help assist her while standing from seated position.  Patient declines wheeled walker today and would prefer to try other techniques for assistance first, such as compression hose.  2) Immunizations / Screenings / Labs:  All immunizations and screenings that patient agrees to, are up-to-date per recommendations or will be updated today.  Patient understands the needs for q 51mo dental and yearly vision screens which pt will schedule independently. Obtain CBC, CMP, HgA1c, Lipid panel, TSH and vit D when fasting if not already done recently.   - Patient with need for liver enzyme re-check- Pt was started on cholesterol medicine after stroke-like episode s/p tPA on 10/23; started on Lipitor when discharged from the hospital, and has not had her ALT re-checked.  - Last mammogram done in 2018.  Need for mammogram in 2019.  Encouraged patient to follow up with gynecology as recommended.  - TDAP up to date.  - Need for flu vaccine.  - Need for Shingles vaccine.  - Pneumonia vaccine last in 2018  - Referral placed to ophthalmology today.  - Patient has never had a colonoscopy.  Interested in [employer].  Educated patient at length about ColoGuard today, and possible need for colonoscopy for follow-up PRN.  3) DEXA Screening (Osteopenia) - DEXA up to date, last obtained in September 2018.  Encouraged patient to engage in weight-bearing exercises and lifting weights twice weekly.  Patient due for re-check in 2020.  Discussed that patient is at high-risk for osteoporosis with her h/o cancer treatment.  - Handout of information provided today regarding bone density medications, recommendations & guidelines.  Patient states "if it's once a week, I think I can handle it."  Agrees to try medication if indicated, and prescribed here.  - Encouraged patient to call and ask for further information regarding her Osteopenia.  4) Weight - Normal BMI  (21.1) - Discussed that the thinner the patient is, the higher her risk for osteoporosis.  - Improve nutrient density of diet through increasing intake of fruits and vegetables and decreasing saturated/trans fats, white flour products and refined sugar products.   5) Lifestyle & Preventative Health Maintenance: - Advised patient to continue working toward exercising to improve overall mental, physical, and emotional health.    American Heart Association guidelines for healthy diet, basically Mediterranean diet, and exercise guidelines of 30 minutes 5 days per week or more discussed in detail.  Health counseling performed.  All questions answered.  - Reviewed the "spokes of the wheel" of mood and health management.  Stressed the  importance of ongoing prudent habits, including regular exercise, appropriate sleep hygiene, healthful dietary habits, and prayer/meditation to relax.  - Discussed the importance of regular socialization and interaction with others.  - Encouraged patient to engage in daily physical activity, especially a formal exercise routine.  Recommended that the patient eventually strive for at least 150 minutes of moderate cardiovascular activity per week according to guidelines established by the [facility].   - Healthy dietary habits encouraged, including low-carb, and high amounts of lean protein in diet.   - Patient should also consume adequate amounts of water.   Meds ordered this encounter  Medications  . Zoster Vaccine Adjuvanted [facility]) injection    Sig: Inject 0.5 mLs into the muscle once for 1 dose.    Dispense:  0.5 mL    Refill:  0  . pneumococcal 23 valent vaccine (PNU-IMMUNE) 25 MCG/0.5ML injection    Sig: Inject 0.5 mLs into the muscle once for 1 dose.    Dispense:  0.5 mL    Refill:  0    Orders Placed This Encounter  Procedures  . Flu vaccine HIGH DOSE PF (Fluzone High dose)  . Cologuard  . ALT  . Comprehensive metabolic panel  . T4, free  . VITAMIN  D 25 Hydroxy (Vit-D Deficiency, Fractures)  . Lipid Panel w/reflex Direct LDL  . TSH  . Sedimentation rate  . B12 and Folate Panel  . Ambulatory referral to Ophthalmology    Gross side effects, risk and benefits, and alternatives of medications discussed with patient.  Patient is aware that all medications have potential side effects and we are unable to predict every side effect or drug-drug interaction that may occur.  Expresses verbal understanding and consents to current therapy plan and treatment regimen.  F-up preventative CPE in 1 year. F/up sooner for chronic care management as discussed and/or prn.  Please see orders placed and AVS handed out to patient at the end of our visit for further patient instructions/ counseling done pertaining to today's office visit.   This document serves as a record of services personally performed by Mellody Dance, DO. It was created on her behalf by Toni Amend, a trained medical scribe. The creation of this record is based on the scribe's personal observations and the provider's statements to them.   I have reviewed the above medical documentation for accuracy and completeness and I concur.  Mellody Dance, DO   I have personally reviewed and noted the following in the patient's chart:   . Medical and social history . Use of alcohol, tobacco or illicit drugs  . Current medications and supplements . Functional ability and status . Nutritional status . Physical activity . Advanced directives . List of other physicians . Hospitalizations, surgeries, and ER visits in previous 12 months . Vitals . Screenings to include cognitive, depression, and falls . Referrals and appointments  In addition, I have reviewed and discussed with patient certain preventive protocols, quality metrics, and best practice recommendations. A written personalized care plan for preventive services as well as general preventive health recommendations were  provided to patient.

## 2018-04-27 NOTE — Patient Instructions (Addendum)
Flu shot today, also we will draw ALT lab since starting [place] will talk to about the Shingrix vaccines.    Need for Mammogram Need for next mammogram now. Last was done 01/30/2017, and needs to be done yearly. Please call for appointment via [facility].   Need for Bone Density Screening (DEXA) Next bone density (DEXA) is due in September of 2020 at the breast center of [facility] as well.     Also if you are willing to go on bisphosphonates medicines for your osteoporosis, please call your insurance and see if you can go sooner for a bone density.  You have the sheet that shows when the last one was done.  If you find out 1 of your insurance companies will pay more frequently than every 2 years, let me know and we can just place this order for you.  -[name] will set you up for a Cologuard test which is the DNA testing to screen for colon cancer.  This is every 3 years.  If 4 weeks after the study is done, you still do not hear about results, please call our office.   Please see the separate handout I gave you on bisphosphonate and osteoporosis treatments.                Preventive Care for Adults, Female  A healthy lifestyle and preventive care can promote health and wellness. Preventive health guidelines for women include the following key practices.   A routine yearly physical is a good way to check with your health care provider about your health and preventive screening. It is a chance to share any concerns and updates on your health and to receive a thorough exam.   Visit your dentist for a routine exam and preventive care every 6 months. Brush your teeth twice a day and floss once a day. Good oral hygiene prevents tooth decay and gum disease.   The frequency of eye exams is based on your age, health, family medical history, use of contact lenses, and other factors. Follow your health care provider's recommendations for frequency of eye  exams.   Eat a healthy diet. Foods like vegetables, fruits, whole grains, low-fat dairy products, and lean protein foods contain the nutrients you need without too many calories. Decrease your intake of foods high in solid fats, added sugars, and salt. Eat the right amount of calories for you.Get information about a proper diet from your health care provider, if necessary.   Regular physical exercise is one of the most important things you can do for your health. Most adults should get at least 150 minutes of moderate-intensity exercise (any activity that increases your heart rate and causes you to sweat) each week. In addition, most adults need muscle-strengthening exercises on 2 or more days a week.   Maintain a healthy weight. The body mass index (BMI) is a screening tool to identify possible weight problems. It provides an estimate of body fat based on height and weight. Your health care provider can find your BMI, and can help you achieve or maintain a healthy weight.For adults 20 years and older:   - A BMI below 18.5 is considered underweight.   - A BMI of 18.5 to 24.9 is normal.   - A BMI of 25 to 29.9 is considered overweight.   - A BMI of 30 and above is considered obese.   Maintain normal blood lipids and cholesterol levels by exercising and minimizing your intake  of trans and saturated fats.  Eat a balanced diet with plenty of fruit and vegetables. Blood tests for lipids and cholesterol should begin at age 56 and be repeated every 5 years minimum.  If your lipid or cholesterol levels are high, you are over 40, or you are at high risk for heart disease, you may need your cholesterol levels checked more frequently.Ongoing high lipid and cholesterol levels should be treated with medicines if diet and exercise are not working.   If you smoke, find out from your health care provider how to quit. If you do not use tobacco, do not start.   Lung cancer screening is recommended for adults  aged 35-80 years who are at high risk for developing lung cancer because of a history of smoking. A yearly low-dose CT scan of the lungs is recommended for people who have at least a 30-pack-year history of smoking and are a current smoker or have quit within the past 15 years. A pack year of smoking is smoking an average of 1 pack of cigarettes a day for 1 year (for example: 1 pack a day for 30 years or 2 packs a day for 15 years). Yearly screening should continue until the smoker has stopped smoking for at least 15 years. Yearly screening should be stopped for people who develop a health problem that would prevent them from having lung cancer treatment.   If you are pregnant, do not drink alcohol. If you are breastfeeding, be very cautious about drinking alcohol. If you are not pregnant and choose to drink alcohol, do not have more than 1 drink per day. One drink is considered to be 12 ounces (355 mL) of beer, 5 ounces (148 mL) of wine, or 1.5 ounces (44 mL) of liquor.   Avoid use of street drugs. Do not share needles with anyone. Ask for help if you need support or instructions about stopping the use of drugs.   High blood pressure causes heart disease and increases the risk of stroke. Your blood pressure should be checked at least yearly.  Ongoing high blood pressure should be treated with medicines if weight loss and exercise do not work.   If you are 49-25 years old, ask your health care provider if you should take aspirin to prevent strokes.   Diabetes screening involves taking a blood sample to check your fasting blood sugar level. This should be done once every 3 years, after age 10, if you are within normal weight and without risk factors for diabetes. Testing should be considered at a younger age or be carried out more frequently if you are overweight and have at least 1 risk factor for diabetes.   Breast cancer screening is essential preventive care for women. You should practice  "breast self-awareness."  This means understanding the normal appearance and feel of your breasts and may include breast self-examination.  Any changes detected, no matter how small, should be reported to a health care provider.  Women in their 51s and 30s should have a clinical breast exam (CBE) by a health care provider as part of a regular health exam every 1 to 3 years.  After age 4, women should have a CBE every year.  Starting at age 31, women should consider having a mammogram (breast X-ray test) every year.  Women who have a family history of breast cancer should talk to their health care provider about genetic screening.  Women at a high risk of breast cancer should talk to  their health care providers about having an MRI and a mammogram every year.   -Breast cancer gene (BRCA)-related cancer risk assessment is recommended for women who have family members with BRCA-related cancers. BRCA-related cancers include breast, ovarian, tubal, and peritoneal cancers. Having family members with these cancers may be associated with an increased risk for harmful changes (mutations) in the breast cancer genes BRCA1 and BRCA2. Results of the assessment will determine the need for genetic counseling and BRCA1 and BRCA2 testing.   The Pap test is a screening test for cervical cancer. A Pap test can show cell changes on the cervix that might become cervical cancer if left untreated. A Pap test is a procedure in which cells are obtained and examined from the lower end of the uterus (cervix).   - Women should have a Pap test starting at age 60.   - Between ages 60 and 1, Pap tests should be repeated every 2 years.   - Beginning at age 27, you should have a Pap test every 3 years as long as the past 3 Pap tests have been normal.   - Some women have medical problems that increase the chance of getting cervical cancer. Talk to your health care provider about these problems. It is especially important to talk to  your health care provider if a new problem develops soon after your last Pap test. In these cases, your health care provider may recommend more frequent screening and Pap tests.   - The above recommendations are the same for women who have or have not gotten the vaccine for human papillomavirus (HPV).   - If you had a hysterectomy for a problem that was not cancer or a condition that could lead to cancer, then you no longer need Pap tests. Even if you no longer need a Pap test, a regular exam is a good idea to make sure no other problems are starting.   - If you are between ages 58 and 70 years, and you have had normal Pap tests going back 10 years, you no longer need Pap tests. Even if you no longer need a Pap test, a regular exam is a good idea to make sure no other problems are starting.   - If you have had past treatment for cervical cancer or a condition that could lead to cancer, you need Pap tests and screening for cancer for at least 20 years after your treatment.   - If Pap tests have been discontinued, risk factors (such as a new sexual partner) need to be reassessed to determine if screening should be resumed.   - The HPV test is an additional test that may be used for cervical cancer screening. The HPV test looks for the virus that can cause the cell changes on the cervix. The cells collected during the Pap test can be tested for HPV. The HPV test could be used to screen women aged 2 years and older, and should be used in women of any age who have unclear Pap test results. After the age of 90, women should have HPV testing at the same frequency as a Pap test.   Colorectal cancer can be detected and often prevented. Most routine colorectal cancer screening begins at the age of 73 years and continues through age 38 years. However, your health care provider may recommend screening at an earlier age if you have risk factors for colon cancer. On a yearly basis, your health care provider may  provide home  test kits to check for hidden blood in the stool.  Use of a small camera at the end of a tube, to directly examine the colon (sigmoidoscopy or colonoscopy), can detect the earliest forms of colorectal cancer. Talk to your health care provider about this at age 67, when routine screening begins. Direct exam of the colon should be repeated every 5 -10 years through age 93 years, unless early forms of pre-cancerous polyps or small growths are found.   People who are at an increased risk for hepatitis B should be screened for this virus. You are considered at high risk for hepatitis B if:  -You were born in a country where hepatitis B occurs often. Talk with your health care provider about which countries are considered high risk.  - Your parents were born in a high-risk country and you have not received a shot to protect against hepatitis B (hepatitis B vaccine).  - You have HIV or AIDS.  - You use needles to inject street drugs.  - You live with, or have sex with, someone who has Hepatitis B.  - You get hemodialysis treatment.  - You take certain medicines for conditions like cancer, organ transplantation, and autoimmune conditions.   Hepatitis C blood testing is recommended for all people born from [birth year] through 1965 and any individual with known risks for hepatitis C.   Practice safe sex. Use condoms and avoid high-risk sexual practices to reduce the spread of sexually transmitted infections (STIs). STIs include gonorrhea, chlamydia, syphilis, trichomonas, herpes, HPV, and human immunodeficiency virus (HIV). Herpes, HIV, and HPV are viral illnesses that have no cure. They can result in disability, cancer, and death. Sexually active women aged 87 years and younger should be checked for chlamydia. Older women with new or multiple partners should also be tested for chlamydia. Testing for other STIs is recommended if you are sexually active and at increased risk.   Osteoporosis is a  disease in which the bones lose minerals and strength with aging. This can result in serious bone fractures or breaks. The risk of osteoporosis can be identified using a bone density scan. Women ages 38 years and over and women at risk for fractures or osteoporosis should discuss screening with their health care providers. Ask your health care provider whether you should take a calcium supplement or vitamin D to There are also several preventive steps women can take to avoid osteoporosis and resulting fractures or to keep osteoporosis from worsening. -->Recommendations include:  Eat a balanced diet high in fruits, vegetables, calcium, and vitamins.  Get enough calcium. The recommended total intake of is 1,200 mg daily; for best absorption, if taking supplements, divide doses into 250-500 mg doses throughout the day. Of the two types of calcium, calcium carbonate is best absorbed when taken with food but calcium citrate can be taken on an empty stomach.  Get enough vitamin D. NAMS and the [employer] recommend at least 1,000 IU per day for women age 18 and over who are at risk of vitamin D deficiency. Vitamin D deficiency can be caused by inadequate sun exposure (for example, those who live in [place]).  Avoid alcohol and smoking. Heavy alcohol intake (more than 7 drinks per week) increases the risk of falls and hip fracture and women smokers tend to lose bone more rapidly and have lower bone mass than nonsmokers. Stopping smoking is one of the most important changes women can make to improve their health and decrease risk for  disease.  Be physically active every day. Weight-bearing exercise (for example, fast walking, hiking, jogging, and weight training) may strengthen bones or slow the rate of bone loss that comes with aging. Balancing and muscle-strengthening exercises can reduce the risk of falling and fracture.  Consider therapeutic medications. Currently, several  types of effective drugs are available. Healthcare providers can recommend the type most appropriate for each woman.  Eliminate environmental factors that may contribute to accidents. Falls cause nearly 90% of all osteoporotic fractures, so reducing this risk is an important bone-health strategy. Measures include ample lighting, removing obstructions to walking, using nonskid rugs on floors, and placing mats and/or grab bars in showers.  Be aware of medication side effects. Some common medicines make bones weaker. These include a type of steroid drug called glucocorticoids used for arthritis and asthma, some antiseizure drugs, certain sleeping pills, treatments for endometriosis, and some cancer drugs. An overactive thyroid gland or using too much thyroid hormone for an underactive thyroid can also be a problem. If you are taking these medicines, talk to your doctor about what you can do to help protect your bones.reduce the rate of osteoporosis.    Menopause can be associated with physical symptoms and risks. Hormone replacement therapy is available to decrease symptoms and risks. You should talk to your health care provider about whether hormone replacement therapy is right for you.   Use sunscreen. Apply sunscreen liberally and repeatedly throughout the day. You should seek shade when your shadow is shorter than you. Protect yourself by wearing long sleeves, pants, a wide-brimmed hat, and sunglasses year round, whenever you are outdoors.   Once a month, do a whole body skin exam, using a mirror to look at the skin on your back. Tell your health care provider of new moles, moles that have irregular borders, moles that are larger than a pencil eraser, or moles that have changed in shape or color.   -Stay current with required vaccines (immunizations).   Influenza vaccine. All adults should be immunized every year.  Tetanus, diphtheria, and acellular pertussis (Td, Tdap) vaccine. Pregnant women  should receive 1 dose of Tdap vaccine during each pregnancy. The dose should be obtained regardless of the length of time since the last dose. Immunization is preferred during the 27th 36th week of gestation. An adult who has not previously received Tdap or who does not know her vaccine status should receive 1 dose of Tdap. This initial dose should be followed by tetanus and diphtheria toxoids (Td) booster doses every 10 years. Adults with an unknown or incomplete history of completing a 3-dose immunization series with Td-containing vaccines should begin or complete a primary immunization series including a Tdap dose. Adults should receive a Td booster every 10 years.  Varicella vaccine. An adult without evidence of immunity to varicella should receive 2 doses or a second dose if she has previously received 1 dose. Pregnant females who do not have evidence of immunity should receive the first dose after pregnancy. This first dose should be obtained before leaving the health care facility. The second dose should be obtained 4 8 weeks after the first dose.  Human papillomavirus (HPV) vaccine. Females aged 59 26 years who have not received the vaccine previously should obtain the 3-dose series. The vaccine is not recommended for use in pregnant females. However, pregnancy testing is not needed before receiving a dose. If a female is found to be pregnant after receiving a dose, no treatment is needed. In that case,  the remaining doses should be delayed until after the pregnancy. Immunization is recommended for any person with an immunocompromised condition through the age of 12 years if she did not get any or all doses earlier. During the 3-dose series, the second dose should be obtained 4 8 weeks after the first dose. The third dose should be obtained 24 weeks after the first dose and 16 weeks after the second dose.  Zoster vaccine. One dose is recommended for adults aged 35 years or older unless certain  conditions are present.  Measles, mumps, and rubella (MMR) vaccine. Adults born before [birth year] generally are considered immune to measles and mumps. Adults born in [birth year] or later should have 1 or more doses of MMR vaccine unless there is a contraindication to the vaccine or there is laboratory evidence of immunity to each of the three diseases. A routine second dose of MMR vaccine should be obtained at least 28 days after the first dose for students attending postsecondary schools, health care workers, or international travelers. People who received inactivated measles vaccine or an unknown type of measles vaccine during 1963 1967 should receive 2 doses of MMR vaccine. People who received inactivated mumps vaccine or an unknown type of mumps vaccine before 1979 and are at high risk for mumps infection should consider immunization with 2 doses of MMR vaccine. For females of childbearing age, rubella immunity should be determined. If there is no evidence of immunity, females who are not pregnant should be vaccinated. If there is no evidence of immunity, females who are pregnant should delay immunization until after pregnancy. Unvaccinated health care workers born before [birth year] who lack laboratory evidence of measles, mumps, or rubella immunity or laboratory confirmation of disease should consider measles and mumps immunization with 2 doses of MMR vaccine or rubella immunization with 1 dose of MMR vaccine.  Pneumococcal 13-valent conjugate (PCV13) vaccine. When indicated, a person who is uncertain of her immunization history and has no record of immunization should receive the PCV13 vaccine. An adult aged 36 years or older who has certain medical conditions and has not been previously immunized should receive 1 dose of PCV13 vaccine. This PCV13 should be followed with a dose of pneumococcal polysaccharide (PPSV23) vaccine. The PPSV23 vaccine dose should be obtained at least 8 weeks after the dose of PCV13 vaccine. An  adult aged 19 years or older who has certain medical conditions and previously received 1 or more doses of PPSV23 vaccine should receive 1 dose of PCV13. The PCV13 vaccine dose should be obtained 1 or more years after the last PPSV23 vaccine dose.  Pneumococcal polysaccharide (PPSV23) vaccine. When PCV13 is also indicated, PCV13 should be obtained first. All adults aged 21 years and older should be immunized. An adult younger than age 77 years who has certain medical conditions should be immunized. Any person who resides in a nursing home or long-term care facility should be immunized. An adult smoker should be immunized. People with an immunocompromised condition and certain other conditions should receive both PCV13 and PPSV23 vaccines. People with human immunodeficiency virus (HIV) infection should be immunized as soon as possible after diagnosis. Immunization during chemotherapy or radiation therapy should be avoided. Routine use of PPSV23 vaccine is not recommended for American Indians, [place] Natives, or people younger than 65 years unless there are medical conditions that require PPSV23 vaccine. When indicated, people who have unknown immunization and have no record of immunization should receive PPSV23 vaccine. One-time revaccination 5 years after the first dose  of PPSV23 is recommended for people aged 34 64 years who have chronic kidney failure, nephrotic syndrome, asplenia, or immunocompromised conditions. People who received 1 2 doses of PPSV23 before age 72 years should receive another dose of PPSV23 vaccine at age 75 years or later if at least 5 years have passed since the previous dose. Doses of PPSV23 are not needed for people immunized with PPSV23 at or after age 65 years.  Meningococcal vaccine. Adults with asplenia or persistent complement component deficiencies should receive 2 doses of quadrivalent meningococcal conjugate (MenACWY-D) vaccine. The doses should be obtained at least 2 months  apart. Microbiologists working with certain meningococcal bacteria, [employer] recruits, people at risk during an outbreak, and people who travel to or live in countries with a high rate of meningitis should be immunized. A first-year college student up through age 7 years who is living in a residence hall should receive a dose if she did not receive a dose on or after her 16th birthday. Adults who have certain high-risk conditions should receive one or more doses of vaccine.  Hepatitis A vaccine. Adults who wish to be protected from this disease, have certain high-risk conditions, work with hepatitis A-infected animals, work in hepatitis A research labs, or travel to or work in countries with a high rate of hepatitis A should be immunized. Adults who were previously unvaccinated and who anticipate close contact with an international adoptee during the first 60 days after arrival in the [place] from a country with a high rate of hepatitis A should be immunized.  Hepatitis B vaccine.  Adults who wish to be protected from this disease, have certain high-risk conditions, may be exposed to blood or other infectious body fluids, are household contacts or sex partners of hepatitis B positive people, are clients or workers in certain care facilities, or travel to or work in countries with a high rate of hepatitis B should be immunized.  Haemophilus influenzae type b (Hib) vaccine. A previously unvaccinated person with asplenia or sickle cell disease or having a scheduled splenectomy should receive 1 dose of Hib vaccine. Regardless of previous immunization, a recipient of a hematopoietic stem cell transplant should receive a 3-dose series 6 12 months after her successful transplant. Hib vaccine is not recommended for adults with HIV infection.  Preventive Services / Frequency Ages 38 to 39years  Blood pressure check.** / Every 1 to 2 years.  Lipid and cholesterol check.** / Every 5 years beginning at age  42.  Clinical breast exam.** / Every 3 years for women in their 62s and 32s.  BRCA-related cancer risk assessment.** / For women who have family members with a BRCA-related cancer (breast, ovarian, tubal, or peritoneal cancers).  Pap test.** / Every 2 years from ages 52 through 67. Every 3 years starting at age 52 through age 38 or 51 with a history of 3 consecutive normal Pap tests.  HPV screening.** / Every 3 years from ages 79 through ages 2 to 27 with a history of 3 consecutive normal Pap tests.  Hepatitis C blood test.** / For any individual with known risks for hepatitis C.  Skin self-exam. / Monthly.  Influenza vaccine. / Every year.  Tetanus, diphtheria, and acellular pertussis (Tdap, Td) vaccine.** / Consult your health care provider. Pregnant women should receive 1 dose of Tdap vaccine during each pregnancy. 1 dose of Td every 10 years.  Varicella vaccine.** / Consult your health care provider. Pregnant females who do not have evidence of immunity  should receive the first dose after pregnancy.  HPV vaccine. / 3 doses over 6 months, if 53 and younger. The vaccine is not recommended for use in pregnant females. However, pregnancy testing is not needed before receiving a dose.  Measles, mumps, rubella (MMR) vaccine.** / You need at least 1 dose of MMR if you were born in 1957 or later. You may also need a 2nd dose. For females of childbearing age, rubella immunity should be determined. If there is no evidence of immunity, females who are not pregnant should be vaccinated. If there is no evidence of immunity, females who are pregnant should delay immunization until after pregnancy.  Pneumococcal 13-valent conjugate (PCV13) vaccine.** / Consult your health care provider.  Pneumococcal polysaccharide (PPSV23) vaccine.** / 1 to 2 doses if you smoke cigarettes or if you have certain conditions.  Meningococcal vaccine.** / 1 dose if you are age 21 to 33 years and a [place] living in a residence hall, or have one of several medical conditions, you need to get vaccinated against meningococcal disease. You may also need additional booster doses.  Hepatitis A vaccine.** / Consult your health care provider.  Hepatitis B vaccine.** / Consult your health care provider.  Haemophilus influenzae type b (Hib) vaccine.** / Consult your health care provider.  Ages 78 to 64years  Blood pressure check.** / Every 1 to 2 years.  Lipid and cholesterol check.** / Every 5 years beginning at age 35 years.  Lung cancer screening. / Every year if you are aged 70 80 years and have a 30-pack-year history of smoking and currently smoke or have quit within the past 15 years. Yearly screening is stopped once you have quit smoking for at least 15 years or develop a health problem that would prevent you from having lung cancer treatment.  Clinical breast exam.** / Every year after age 77 years.  BRCA-related cancer risk assessment.** / For women who have family members with a BRCA-related cancer (breast, ovarian, tubal, or peritoneal cancers).  Mammogram.** / Every year beginning at age 47 years and continuing for as long as you are in good health. Consult with your health care provider.  Pap test.** / Every 3 years starting at age 36 years through age 62 or 103 years with a history of 3 consecutive normal Pap tests.  HPV screening.** / Every 3 years from ages 58 years through ages 31 to 54 years with a history of 3 consecutive normal Pap tests.  Fecal occult blood test (FOBT) of stool. / Every year beginning at age 41 years and continuing until age 36 years. You may not need to do this test if you get a colonoscopy every 10 years.  Flexible sigmoidoscopy or colonoscopy.** / Every 5 years for a flexible sigmoidoscopy or every 10 years for a colonoscopy beginning at age 30 years and continuing until age 96 years.  Hepatitis C blood test.** / For all people born from [birth year]  through 1965 and any individual with known risks for hepatitis C.  Skin self-exam. / Monthly.  Influenza vaccine. / Every year.  Tetanus, diphtheria, and acellular pertussis (Tdap/Td) vaccine.** / Consult your health care provider. Pregnant women should receive 1 dose of Tdap vaccine during each pregnancy. 1 dose of Td every 10 years.  Varicella vaccine.** / Consult your health care provider. Pregnant females who do not have evidence of immunity should receive the first dose after pregnancy.  Zoster vaccine.** / 1 dose for adults aged 32 years or  older.  Measles, mumps, rubella (MMR) vaccine.** / You need at least 1 dose of MMR if you were born in 1957 or later. You may also need a 2nd dose. For females of childbearing age, rubella immunity should be determined. If there is no evidence of immunity, females who are not pregnant should be vaccinated. If there is no evidence of immunity, females who are pregnant should delay immunization until after pregnancy.  Pneumococcal 13-valent conjugate (PCV13) vaccine.** / Consult your health care provider.  Pneumococcal polysaccharide (PPSV23) vaccine.** / 1 to 2 doses if you smoke cigarettes or if you have certain conditions.  Meningococcal vaccine.** / Consult your health care provider.  Hepatitis A vaccine.** / Consult your health care provider.  Hepatitis B vaccine.** / Consult your health care provider.  Haemophilus influenzae type b (Hib) vaccine.** / Consult your health care provider.  Ages 88 years and over  Blood pressure check.** / Every 1 to 2 years.  Lipid and cholesterol check.** / Every 5 years beginning at age 44 years.  Lung cancer screening. / Every year if you are aged 37 80 years and have a 30-pack-year history of smoking and currently smoke or have quit within the past 15 years. Yearly screening is stopped once you have quit smoking for at least 15 years or develop a health problem that would prevent you from having lung  cancer treatment.  Clinical breast exam.** / Every year after age 73 years.  BRCA-related cancer risk assessment.** / For women who have family members with a BRCA-related cancer (breast, ovarian, tubal, or peritoneal cancers).  Mammogram.** / Every year beginning at age 3 years and continuing for as long as you are in good health. Consult with your health care provider.  Pap test.** / Every 3 years starting at age 7 years through age 19 or 74 years with 3 consecutive normal Pap tests. Testing can be stopped between 65 and 70 years with 3 consecutive normal Pap tests and no abnormal Pap or HPV tests in the past 10 years.  HPV screening.** / Every 3 years from ages 102 years through ages 30 or 33 years with a history of 3 consecutive normal Pap tests. Testing can be stopped between 65 and 70 years with 3 consecutive normal Pap tests and no abnormal Pap or HPV tests in the past 10 years.  Fecal occult blood test (FOBT) of stool. / Every year beginning at age 53 years and continuing until age 43 years. You may not need to do this test if you get a colonoscopy every 10 years.  Flexible sigmoidoscopy or colonoscopy.** / Every 5 years for a flexible sigmoidoscopy or every 10 years for a colonoscopy beginning at age 83 years and continuing until age 59 years.  Hepatitis C blood test.** / For all people born from [birth year] through 1965 and any individual with known risks for hepatitis C.  Osteoporosis screening.** / A one-time screening for women ages 30 years and over and women at risk for fractures or osteoporosis.  Skin self-exam. / Monthly.  Influenza vaccine. / Every year.  Tetanus, diphtheria, and acellular pertussis (Tdap/Td) vaccine.** / 1 dose of Td every 10 years.  Varicella vaccine.** / Consult your health care provider.  Zoster vaccine.** / 1 dose for adults aged 73 years or older.  Pneumococcal 13-valent conjugate (PCV13) vaccine.** / Consult your health care provider.  Pneumococcal  polysaccharide (PPSV23) vaccine.** / 1 dose for all adults aged 33 years and older.  Meningococcal vaccine.** / Consult your  health care provider.  Hepatitis A vaccine.** / Consult your health care provider.  Hepatitis B vaccine.** / Consult your health care provider.  Haemophilus influenzae type b (Hib) vaccine.** / Consult your health care provider. ** Family history and personal history of risk and conditions may change your health care provider's recommendations. Document Released: 07/02/2001 Document Revised: 02/24/2013  [facility] Patient Information 2014 [place], [place].   EXERCISE AND DIET:  We recommended that you start or continue a regular exercise program for good health. Regular exercise means any activity that makes your heart beat faster and makes you sweat.  We recommend exercising at least 30 minutes per day at least 3 days a week, preferably 5.  We also recommend a diet low in fat and sugar / carbohydrates.  Inactivity, poor dietary choices and obesity can cause diabetes, heart attack, stroke, and kidney damage, among others.     ALCOHOL AND SMOKING:  Women should limit their alcohol intake to no more than 7 drinks/beers/glasses of wine (combined, not each!) per week. Moderation of alcohol intake to this level decreases your risk of breast cancer and liver damage.  ( And of course, no recreational drugs are part of a healthy lifestyle.)  Also, you should not be smoking at all or even being exposed to second hand smoke. Most people know smoking can cause cancer, and various heart and lung diseases, but did you know it also contributes to weakening of your bones?  Aging of your skin?  Yellowing of your teeth and nails?   CALCIUM AND VITAMIN D:  Adequate intake of calcium and Vitamin D are recommended.  The recommendations for exact amounts of these supplements seem to change often, but generally speaking 600 mg of calcium (either carbonate or citrate) and 800 units of Vitamin D  per day seems prudent. Certain women may benefit from higher intake of Vitamin D.  If you are among these women, your doctor will have told you during your visit.     PAP SMEARS:  Pap smears, to check for cervical cancer or precancers,  have traditionally been done yearly, although recent scientific advances have shown that most women can have pap smears less often.  However, every woman still should have a physical exam from her gynecologist or primary care physician every year. It will include a breast check, inspection of the vulva and vagina to check for abnormal growths or skin changes, a visual exam of the cervix, and then an exam to evaluate the size and shape of the uterus and ovaries.  And after 67 years of age, a rectal exam is indicated to check for rectal cancers. We will also provide age appropriate advice regarding health maintenance, like when you should have certain vaccines, screening for sexually transmitted diseases, bone density testing, colonoscopy, mammograms, etc.    MAMMOGRAMS:  All women over 52 years old should have a yearly mammogram. Many facilities now offer a "3D" mammogram, which may cost around $50 extra out of pocket. If possible,  we recommend you accept the option to have the 3D mammogram performed.  It both reduces the number of women who will be called back for extra views which then turn out to be normal, and it is better than the routine mammogram at detecting truly abnormal areas.     COLONOSCOPY:  Colonoscopy to screen for colon cancer is recommended for all women at age 55.  We know, you hate the idea of the prep.  We agree, BUT, having colon cancer  and not knowing it is worse!!  Colon cancer so often starts as a polyp that can be seen and removed at colonscopy, which can quite literally save your life!  And if your first colonoscopy is normal and you have no family history of colon cancer, most women don't have to have it again for 10 years.  Once every ten  years, you can do something that may end up saving your life, right?  We will be happy to help you get it scheduled when you are ready.  Be sure to check your insurance coverage so you understand how much it will cost.  It may be covered as a preventative service at no cost, but you should check your particular policy.

## 2018-04-28 ENCOUNTER — Other Ambulatory Visit (INDEPENDENT_AMBULATORY_CARE_PROVIDER_SITE_OTHER): Payer: Medicare Other

## 2018-04-28 ENCOUNTER — Telehealth: Payer: Self-pay | Admitting: Family Medicine

## 2018-04-28 ENCOUNTER — Other Ambulatory Visit: Payer: Self-pay | Admitting: Family Medicine

## 2018-04-28 DIAGNOSIS — Z1231 Encounter for screening mammogram for malignant neoplasm of breast: Secondary | ICD-10-CM

## 2018-04-28 DIAGNOSIS — Z79899 Other long term (current) drug therapy: Secondary | ICD-10-CM | POA: Diagnosis not present

## 2018-04-28 NOTE — Telephone Encounter (Signed)
Patient called request to speak with nurse/medical assistant regarding a Rx that she takes, says provider wants to schedule her for a Bone Density test (last one in Sept) & she needs to know if she should begin taking the Rx(didn't gv name of prescription).  ---Forwarding message to medical assistant to review w/provider & call patient w/ decision.  --glh

## 2018-04-29 LAB — ALT: ALT: 18 IU/L (ref 0–32)

## 2018-04-29 NOTE — Telephone Encounter (Signed)
Spoke to patient and advised that per last note Dr. Raliegh Scarlet wanted to get the Bone Density study repeat before starting the medication.  Patient is advised to call her insurance company and check to see if a repeat study will be covered to repeat before 2 years.  Patient to follow up with me after contacting insurance. MPulliam, CMA/RT(R)

## 2018-04-29 NOTE — Telephone Encounter (Signed)
Called and left message for patient to call the office. MPulliam, CMA/RT(R)  

## 2018-05-12 ENCOUNTER — Other Ambulatory Visit: Payer: Self-pay | Admitting: Family Medicine

## 2018-05-21 ENCOUNTER — Telehealth: Payer: Self-pay | Admitting: Internal Medicine

## 2018-05-21 MED ORDER — BUDESONIDE-FORMOTEROL FUMARATE 80-4.5 MCG/ACT IN AERO: 2.0000 | INHALATION_SPRAY | Freq: Two times a day (BID) | RESPIRATORY_TRACT | 0 refills | Status: DC

## 2018-05-21 NOTE — Telephone Encounter (Signed)
Called and spoke with Patient. She is requesting a new refill for her Symbicort 80.   Patient is scheduled OV 05/25/18, at 1145, with Dr. Melvyn Novas.  Last OV was 09/17/16, Per Dr. Gustavus Bryant last OV- Pulmonary follow up is as needed  Symbicort 80, inhaler,  2 puffs, twice per day, sent [facility] [address], with no refills, pending OV. Nothing further at this time.

## 2018-05-25 ENCOUNTER — Ambulatory Visit (INDEPENDENT_AMBULATORY_CARE_PROVIDER_SITE_OTHER): Payer: Medicare Other | Admitting: Internal Medicine

## 2018-05-25 ENCOUNTER — Encounter: Payer: Self-pay | Admitting: Internal Medicine

## 2018-05-25 DIAGNOSIS — J449 Chronic obstructive pulmonary disease, unspecified: Secondary | ICD-10-CM | POA: Diagnosis not present

## 2018-05-25 DIAGNOSIS — F17210 Nicotine dependence, cigarettes, uncomplicated: Secondary | ICD-10-CM

## 2018-05-25 MED ORDER — FLUTICASONE-SALMETEROL 115-21 MCG/ACT IN AERO: 2.0000 | INHALATION_SPRAY | Freq: Two times a day (BID) | RESPIRATORY_TRACT | 12 refills | Status: DC

## 2018-05-25 NOTE — Patient Instructions (Signed)
Advair 115  Take 2 puffs first thing in am and then another 2 puffs about 12 hours later.   If you are not satisfied for any reason, please return your drug formulary to pick an alternative   Please schedule a follow up visit in 12  months but call sooner if needed

## 2018-05-25 NOTE — Progress Notes (Signed)
Subjective:    Patient ID: Joy Williams, female    DOB: 1951/04/20    MRN: [id number]    Brief patient profile:  27   yowm quit smoking 06/2016  dx with CREST 2004 in [place] moved to [place] from [place] Nov 2015  self referred to pulmonary clinic 10/03/14  for copd though last set of pfts from 2014 did not show copd    History of Present Illness  10/03/2014 1st [facility] office visit/   On spiriva chronically  Chief Complaint  Patient presents with  . Pulmonary Consult    Self referral. Pt moved from [place] to [facility] in Nov 2015. 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  She c/o "smoker's cough"- proc with clear sputum.  Her breathing is overall doing well.    cough is the main problem - can still do 5k racing power walk s stopping ie Not limited by breathing from desired activities  Takes fish oil for heart regurgitation and it "works great"  Cough is daily , def Worse in am's year round, present x > 5 y, worse since in [place]  rec Stop spiriva and start >>> Dulera 100 Take 2 puffs first thing in am and then another 2 puffs about 12 hours later.  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 Surgical resection with wedge resection and mediastinal lymph node sampling 12/2014  Records suggest  pneumonectomy done but clearly this is an error      06/07/2015  Extended f/u ov/ re: transition of care  sob s/p L lung resection maint rx dulera 100 2bid  Chief Complaint  Patient presents with  . Follow-up    Pt c/o increased DOE since had lung surgery for lung ca in Sept 2016.   doe x  MMRC2 = can't walk a nl pace on a flat grade s sob/ assoc with day > noct cough/ no assoc dysphagia/ has not had any RT  rec For cough > mucinex dm 1200 mg every 12 hours as needed Continue dulera 100 Take 2 puffs first thing in am and then another 2 puffs about 12 hours later.  Work on inhaler technique:  The key is to stop smoking completely before smoking completely stops you!  Try prilosec otc 36m  Take 30-60 min before first meal of the day and Pepcid ac (famotidine) 20 mg one @  bedtime until cough is completely gone for at least a week without the need for cough suppression GERD diet    09/17/2016  f/u ov/ re: AB on dulera 100 2bid but insurance no longer covering  Chief Complaint  Patient presents with  . Follow-up    Pt states her breathing is doing well. She is requesting refill on Dulera. She states she has AM cough  if she forgets to take the PM dose of Dulera. She does not have an albuterol inhaler.   working toward a 5 k this month s/p  MVR 06/27/16 rec  Work on inhaler technique:  Continue symbicort 80 Take 2 puffs first thing in am and then another 2 puffs about 12 hours  lateer    05/25/2018  f/u ov/ re: AB  Was on symb 80 2 bid doing ok  Chief Complaint  Patient presents with  . Follow-up    Insurance stopped covering symbicort and so she has been out of med x 4 days. She states her breathing has been "okay".  She does not have a rescue inhaler.   Dyspnea:  Not limited by breathing from desired activities  As long as on symb Cough: not a problem Sleeping: fine bed flat  SABA use: none 02: none    No obvious day to day or daytime variability or assoc excess/  purulent sputum or mucus plugs or hemoptysis or cp or chest tightness, subjective wheeze or overt sinus or hb symptoms.   Sleeping as above  without nocturnal  or early am exacerbation  of respiratory  c/o's or need for noct saba. Also denies any obvious fluctuation of symptoms with weather or environmental changes or other aggravating or alleviating factors except as outlined above   No unusual exposure hx or h/o childhood pna/ asthma or knowledge of premature birth.  Current Allergies, Complete Past Medical History, Past Surgical History, Family History, and Social History were reviewed in [employer] record.  ROS  The following are not active complaints unless bolded Hoarseness, sore throat, dysphagia, dental problems, itching, sneezing,  nasal congestion or discharge of excess mucus or purulent secretions, ear ache,   fever, chills, sweats, unintended wt loss or wt gain, classically pleuritic or exertional cp,  orthopnea pnd or arm/hand swelling  or leg swelling if on them all day, presyncope, palpitations, abdominal pain, anorexia, nausea, vomiting, diarrhea  or change in bowel habits or change in bladder habits, change in stools or change in urine, dysuria, hematuria,  rash, arthralgias, visual complaints, headache, numbness, weakness or ataxia or problems with walking or coordination,  change in mood or  memory.        Current Meds  Medication Sig  . acetaminophen (TYLENOL) 500 MG tablet Take 1,000 mg by mouth every 6 (six) hours as needed for mild pain or headache.  Marland Kitchen atorvastatin (LIPITOR) 20 MG tablet Take 1 tablet (20 mg total) by mouth daily at 6 PM.  . Cholecalciferol (VITAMIN D3) 5000 units TABS 5,000 IU OTC vitamin D3 daily.  . clopidogrel (PLAVIX) 75 MG tablet Take 1 tablet (75 mg total) by mouth daily.  Marland Kitchen levothyroxine (SYNTHROID, LEVOTHROID) 175 MCG tablet TAKE 1 TABLET BY MOUTH EVERY DAY  . Multiple Vitamins-Minerals (MULTIVITAMIN WITH MINERALS) tablet  Take 1 tablet by mouth daily.  . naproxen sodium (ALEVE) 220 MG tablet Take 220 mg by mouth as needed (pain).  . Turmeric 500 MG CAPS Take 500 mg by mouth daily.               Objective:   Physical Exam  amb wf nad    05/25/2018         134  09/17/2016         129  06/07/2015       134   11/14/14 127 lb (57.607 kg)  10/28/14 125 lb (56.7 kg)  10/03/14 128 lb (58.06 kg)  Vital signs reviewed - Note on arrival 02 sats  95% on RA         HEENT: nl dentition / oropharynx. Nl external ear canals without cough reflex -  Mild bilateral non-specific turbinate edema     NECK :  without JVD/Nodes/TM/ nl carotid upstrokes bilaterally   LUNGS: no acc muscle use,  Mild barrel  contour chest wall with bilateral  Distant bs s audible wheeze and  without cough on insp or exp maneuver and mild  Hyperresonant  to  percussion bilaterally     CV:  RRR  no s3 or murmur or increase in P2, and no edema   ABD:  soft and nontender with pos late insp Hoover's  in the supine position. No bruits or organomegaly appreciated, bowel sounds nl  MS:   Nl gait/  ext warm without deformities, calf tenderness, cyanosis or clubbing No obvious joint restrictions   SKIN: warm and dry without lesions    NEURO:  alert, approp, nl sensorium with  no motor or cerebellar deficits apparent.                   Assessment & Plan:

## 2018-05-27 ENCOUNTER — Encounter: Payer: Self-pay | Admitting: Internal Medicine

## 2018-05-27 NOTE — Assessment & Plan Note (Addendum)
Quit smoking 06/2016  -12/16/2012  s sign obst or even concavity to f/v loop  - 10/03/2014   try dulera 100 2bid  - PFTs 11/14/14 on dulera FEV1  2.33 (86%) ratio 78% and no change p saba and dlco 52% corrects to 65%  - Spirometry 06/07/2015  FEV1 1.74 (67%)  Ratio 76    - 05/25/2018  After extensive coaching inhaler device,  effectiveness =    75% > changed to advair 115 hfa by insurance   Advised:  formulary restrictions will be an ongoing challenge for the forseable future and I would be happy to pick an alternative if the pt will first  provide me a list of them -  pt  will need to return here for training for any new device that is required eg dpi vs hfa vs respimat.    In the meantime we can always provide samples so that the patient never runs out of any needed respiratory medications.    >>> f/u can be yearly and in interim prn flares/ contingencies reviewed

## 2018-05-27 NOTE — Assessment & Plan Note (Signed)
Counseled re importance of smoking cessation but did not meet time criteria for separate billing     I had an extended discussion with the patient reviewing all relevant studies completed to date and  lasting 15 to 20 minutes of a 25 minute visit    See device teaching which also  extended face to face time for this visit.  Each maintenance medication was reviewed in detail including emphasizing most importantly the difference between maintenance and prns and under what circumstances the prns are to be triggered using an action plan format that is not reflected in the computer generated alphabetically organized AVS which I have not found useful in most complex patients, especially with respiratory illnesses  Please see AVS for specific instructions unique to this visit that I personally wrote and verbalized to the the pt in detail and then reviewed with pt  by my nurse highlighting any  changes in therapy recommended at today's visit to their plan of care.

## 2018-06-04 ENCOUNTER — Other Ambulatory Visit: Payer: Self-pay

## 2018-06-04 ENCOUNTER — Ambulatory Visit: Payer: Medicare Other

## 2018-06-04 NOTE — Patient Outreach (Signed)
First attempt to obtain mRs. No answer. Left message for return call.  

## 2018-06-04 NOTE — Patient Outreach (Signed)
Telephone outreach to patient to obtain mRs was successfully completed. mRs= 0. 

## 2018-06-15 ENCOUNTER — Telehealth: Payer: Self-pay | Admitting: Family Medicine

## 2018-06-15 ENCOUNTER — Other Ambulatory Visit: Payer: Self-pay

## 2018-06-15 DIAGNOSIS — E785 Hyperlipidemia, unspecified: Secondary | ICD-10-CM

## 2018-06-15 DIAGNOSIS — R2990 Unspecified symptoms and signs involving the nervous system: Secondary | ICD-10-CM

## 2018-06-15 DIAGNOSIS — I639 Cerebral infarction, unspecified: Principal | ICD-10-CM

## 2018-06-15 NOTE — Telephone Encounter (Signed)
Pt called states while in hospital back in Oct she was prescribed :  Lipitor & Plavix by hospital doctor( they gave 3 month supply--- Patient is requesting refills :   1)--- atorvastatin (LIPITOR) 20 MG tablet [[id number]]   Order Details  Dose: 20 mg Route: Oral Frequency: Daily-1800  Dispense Quantity: 30 tablet Refills: 2 Fills remaining: --        Sig: Take 1 tablet (20 mg total) by mouth daily at 6 PM.     2) ----- clopidogrel (PLAVIX) 75 MG tablet [[id number]]   Order Details  Dose: 75 mg Route: Oral Frequency: Daily  Dispense Quantity: 30 tablet Refills: 2 Fills remaining: --        Sig: Take 1 tablet (75 mg total) by mouth daily.     ---Forwarding message to medical assistant that if approved,send to :      [id number] [place], [place]. [phone] (Phone) [phone] (Fax)   --Dion Body

## 2018-06-15 NOTE — Telephone Encounter (Signed)
Sent refill request to Dr. Raliegh Scarlet for review. MPulliam, CMA/RT(R)

## 2018-06-15 NOTE — Telephone Encounter (Signed)
Tell patient that Plavix needs to come from her neurologist since it was for strokelike symptoms.  They would have a better handle on how long she needs to be on it etc.  Also we can give her the Lipitor 90-day supply with 1 refill.  But make sure she knows that she will need she has had a CMP after starting Lipitor as well as a 3 to 67-month follow-up after starting it of rechecking her cholesterol.  I am not sure this was done.  Thank you.

## 2018-06-15 NOTE — Telephone Encounter (Signed)
Patient called in requesting refill on Atorvastatin and Plavix.  Patient was started on both during hospital visit and was given 3 months worth. LOV 04/27/2018.  Please review and advise. MPulliam, CMA/RT(R)

## 2018-06-16 ENCOUNTER — Other Ambulatory Visit: Payer: Self-pay | Admitting: Adult Health

## 2018-06-16 ENCOUNTER — Other Ambulatory Visit: Payer: Self-pay | Admitting: Internal Medicine

## 2018-06-16 ENCOUNTER — Other Ambulatory Visit: Payer: Self-pay

## 2018-06-16 MED ORDER — ATORVASTATIN CALCIUM 20 MG PO TABS: 20.0000 mg | ORAL_TABLET | Freq: Every day | ORAL | 1 refills | Status: DC

## 2018-06-16 MED ORDER — CLOPIDOGREL BISULFATE 75 MG PO TABS: 75.0000 mg | ORAL_TABLET | Freq: Every day | ORAL | 0 refills | Status: DC

## 2018-06-16 NOTE — Addendum Note (Signed)
Addended by: France Ravens I on: 06/16/2018 04:53 PM   Modules accepted: Orders

## 2018-06-16 NOTE — Telephone Encounter (Signed)
Pt is asking if NP Janett Billow will allow a refill on her clopidogrel (PLAVIX) 75 MG tablet  [id number] *no changes to insurance*

## 2018-06-16 NOTE — Telephone Encounter (Signed)
[facility]. I spoke with pt. and let her know this has been done. She has pending f/u with Janett Billow in Feb/fim

## 2018-06-29 ENCOUNTER — Telehealth: Payer: Self-pay | Admitting: Internal Medicine

## 2018-06-29 NOTE — Telephone Encounter (Signed)
Called and spoke to patient. She states the Advair is not helping her and would like to be changed back to symbicort. According to Dr. Gustavus Bryant last office visit patient was to call with formulary. Patient is going to call back once she finds out what inhalers are covered by her insurance.

## 2018-07-01 NOTE — Telephone Encounter (Signed)
Called patient, unable to reach left message to give us a call back. 

## 2018-07-01 NOTE — Telephone Encounter (Signed)
LMTCB x1 for pt to see if she has gotten her drug formulary yet.

## 2018-07-01 NOTE — Telephone Encounter (Signed)
Patient states she has recd her drug formulary.  She states she will drop off the part pertaining to respiratory/pulmonary/inhalers-meds by the office tomorrow, 02/13.

## 2018-07-02 ENCOUNTER — Ambulatory Visit
Admission: RE | Admit: 2018-07-02 | Discharge: 2018-07-02 | Disposition: A | Discharge status: Home / Self Care | Payer: Medicare Other | Source: Ambulatory Visit | Attending: Family Medicine | Admitting: Family Medicine

## 2018-07-02 DIAGNOSIS — Z1231 Encounter for screening mammogram for malignant neoplasm of breast: Secondary | ICD-10-CM | POA: Diagnosis not present

## 2018-07-02 NOTE — Telephone Encounter (Signed)
Message will be routed to [facility] for follow up.

## 2018-07-02 NOTE — Telephone Encounter (Signed)
Spoke with pt. She states that she is still going to bring her formulary to [place] today. Will update chart once this is received.

## 2018-07-02 NOTE — Telephone Encounter (Signed)
Patient came by to drop off the formulary that was requested. Formulary is in MW mailbox at the front desk. CB is [phone].

## 2018-07-02 NOTE — Telephone Encounter (Signed)
Pt is returning call. Cb is [phone]

## 2018-07-02 NOTE — Telephone Encounter (Signed)
Placed in Dr Gustavus Bryant lookat

## 2018-07-02 NOTE — Telephone Encounter (Signed)
Called patient, unable to reach left message to give us a call back. 

## 2018-07-03 ENCOUNTER — Other Ambulatory Visit: Payer: Self-pay | Admitting: Family Medicine

## 2018-07-03 DIAGNOSIS — R928 Other abnormal and inconclusive findings on diagnostic imaging of breast: Secondary | ICD-10-CM

## 2018-07-06 NOTE — Telephone Encounter (Signed)
Previously routed to Dr Melvyn Novas.

## 2018-07-07 NOTE — Telephone Encounter (Signed)
lmtcb for pt.  Need to schedule ROV as pt does not have one.  Also need to see if a sample needs to be given.

## 2018-07-07 NOTE — Telephone Encounter (Signed)
Will route this over to both [facility] and Dr. Melvyn Novas for follow up. MW please advise if there is another alternative on patients formulary that we can change her to. Thank you.

## 2018-07-07 NOTE — Telephone Encounter (Signed)
Patient scheduled for Thursday and does not need sample today.Nothing further needed at this time.

## 2018-07-07 NOTE — Telephone Encounter (Signed)
This can wait until ov and ok to give sample if risk running out prior to the ov

## 2018-07-07 NOTE — Telephone Encounter (Signed)
Pt is calling back [phone]

## 2018-07-09 ENCOUNTER — Ambulatory Visit (INDEPENDENT_AMBULATORY_CARE_PROVIDER_SITE_OTHER): Payer: Medicare Other | Admitting: Internal Medicine

## 2018-07-09 ENCOUNTER — Ambulatory Visit
Admission: RE | Admit: 2018-07-09 | Discharge: 2018-07-09 | Disposition: A | Discharge status: Home / Self Care | Payer: Medicare Other | Source: Ambulatory Visit | Attending: Family Medicine | Admitting: Family Medicine

## 2018-07-09 ENCOUNTER — Encounter: Payer: Self-pay | Admitting: Internal Medicine

## 2018-07-09 ENCOUNTER — Other Ambulatory Visit: Payer: Self-pay | Admitting: Family Medicine

## 2018-07-09 DIAGNOSIS — R928 Other abnormal and inconclusive findings on diagnostic imaging of breast: Secondary | ICD-10-CM

## 2018-07-09 DIAGNOSIS — N6310 Unspecified lump in the right breast, unspecified quadrant: Secondary | ICD-10-CM

## 2018-07-09 DIAGNOSIS — J449 Chronic obstructive pulmonary disease, unspecified: Secondary | ICD-10-CM

## 2018-07-09 DIAGNOSIS — J4489 Other specified chronic obstructive pulmonary disease: Secondary | ICD-10-CM

## 2018-07-09 DIAGNOSIS — F17210 Nicotine dependence, cigarettes, uncomplicated: Secondary | ICD-10-CM | POA: Diagnosis not present

## 2018-07-09 DIAGNOSIS — I639 Cerebral infarction, unspecified: Secondary | ICD-10-CM

## 2018-07-09 DIAGNOSIS — R921 Mammographic calcification found on diagnostic imaging of breast: Secondary | ICD-10-CM | POA: Diagnosis not present

## 2018-07-09 DIAGNOSIS — N6489 Other specified disorders of breast: Secondary | ICD-10-CM | POA: Diagnosis not present

## 2018-07-09 MED ORDER — FLUTICASONE-UMECLIDIN-VILANT 100-62.5-25 MCG/INH IN AEPB: 1.0000 | INHALATION_SPRAY | Freq: Every day | RESPIRATORY_TRACT | 11 refills | Status: DC

## 2018-07-09 MED ORDER — FLUTICASONE-UMECLIDIN-VILANT 100-62.5-25 MCG/INH IN AEPB: 1.0000 | INHALATION_SPRAY | Freq: Every day | RESPIRATORY_TRACT | 0 refills | Status: DC

## 2018-07-09 NOTE — Patient Instructions (Signed)
Stop advair and start trelegy one click each am x 2 good drags and use arm and hammer tooth paste and gargle and spit out  If not happy with trelegy then call for PA on symbicort   The key is to stop smoking completely before smoking completely stops you! -For smoking cessation classes call [phone]       Please schedule a follow up visit in 3 months but call sooner if needed PFTs

## 2018-07-09 NOTE — Progress Notes (Signed)
Subjective:    Patient ID: Joy Williams, female    DOB: 1951/02/12    MRN: [id number]    Brief patient profile:  52   yowm Active smoker   dx with CREST 2004 in [place] moved to [place] from [place] Nov 2015  self referred to pulmonary clinic 10/03/14  for copd though last set of pfts from 2014 did not show copd    History of Present Illness  10/03/2014 1st [facility] office visit/ Joy Williams  On spiriva chronically  Chief Complaint  Patient presents with  . Pulmonary Consult    Self referral. Pt moved from [place] to [facility] in Nov 2015. 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  She c/o "smoker's cough"- proc with clear sputum.  Her breathing is overall doing well.    cough is the main problem - can still do 5k racing power walk s stopping ie Not limited by breathing from desired activities  Takes fish oil for heart regurgitation and it "works great"  Cough is daily , def Worse in am's year round, present x > 5 y, worse since in [place]  rec Stop spiriva and start >>> Dulera 100 Take 2 puffs first thing in am and then another 2 puffs about 12 hours later.  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 Surgical resection with wedge resection and mediastinal lymph node sampling 12/2014  Records suggest  pneumonectomy done but clearly this is an error      06/07/2015  Extended f/u ov/Joy Williams re: transition of care  sob s/p L lung resection maint rx dulera 100 2bid  Chief Complaint  Patient presents with  . Follow-up    Pt c/o increased DOE since had lung surgery for lung ca in Sept 2016.   doe x  MMRC2 = can't walk a nl pace on a flat grade s sob/ assoc with day > noct cough/ no assoc dysphagia/ has not had any RT  rec For cough > mucinex dm 1200 mg every 12 hours as needed Continue dulera 100 Take 2 puffs first thing in am and then another 2 puffs about 12 hours later.  Work on inhaler technique:  The key is to stop smoking completely before smoking completely stops you!  Try prilosec otc 27m  Take 30-60 min before first meal of the day and Pepcid ac (famotidine) 20 mg one @  bedtime until cough is completely gone for at least a week without the need for cough suppression GERD diet    09/17/2016  f/u ov/Joy Williams re: AB on dulera 100 2bid but insurance no longer covering  Chief Complaint  Patient presents with  . Follow-up    Pt states her breathing is doing well. She is requesting refill on Dulera. She states she has AM cough  if she forgets to take the PM dose of Dulera. She does not have an albuterol inhaler.   working toward a 5 k this month s/p  MVR 06/27/16 rec  Work on inhaler technique:  Continue symbicort 80 Take 2 puffs first thing in am and then another 2 puffs about 12 hours  lateer    05/25/2018  f/u ov/Joy Williams re: AB  Was on symb 80 2 bid doing ok  Chief Complaint  Patient presents with  . Follow-up    Insurance stopped covering symbicort and so she has been out of med x 4 days. She states her breathing has been "okay".  She does not have a rescue inhaler.   Dyspnea:  Not limited by breathing from desired activities  As long as on symb Cough: not a problem Sleeping: fine bed flat  SABA use: none 02: none  rec Advair 115  Take 2 puffs first thing in am and then another 2 puffs  about 12 hours later.  If you are not satisfied for any reason, please return your drug formulary to pick an alternative   07/09/2018  f/u ov/Joy Williams re:   Ab/resumed  smoking  Chief Complaint  Patient presents with  . Follow-up    discuss inhalers per 2/10 phone note.  advair is not working as well as the [employer].    Dyspnea:  More sob on advair vs symbicort Cough: some worse on advair/ dry hacking  Sleeping: up at least once cough SABA use: never uses  02: none    No obvious day to day or daytime variability or assoc excess/ purulent sputum or mucus plugs or hemoptysis or cp or chest tightness, subjective wheeze or overt sinus or hb symptoms.     Also denies any obvious fluctuation of symptoms with weather or environmental changes or other aggravating or alleviating factors except as outlined above   No unusual exposure hx or h/o childhood pna/ asthma or knowledge of premature birth.  Current Allergies, Complete Past Medical History, Past Surgical History, Family History, and Social History were reviewed in [employer] record.  ROS  The following are not active complaints unless bolded Hoarseness, sore throat, dysphagia, dental problems, itching, sneezing,  nasal congestion or discharge of excess mucus or purulent secretions, ear ache,   fever, chills, sweats, unintended wt loss or wt gain, classically pleuritic or exertional cp,  orthopnea pnd or arm/hand swelling  or leg swelling, presyncope, palpitations, abdominal pain, anorexia, nausea, vomiting, diarrhea  or change in bowel habits or change in bladder habits, change in stools or change in urine, dysuria, hematuria,  rash, arthralgias, visual complaints, headache, numbness, weakness or ataxia or problems with walking or coordination,  change in mood or  memory.        Current Meds  Medication Sig  . acetaminophen (TYLENOL) 500 MG tablet Take 1,000 mg by mouth every 6 (six) hours as needed for mild pain or  headache.  Marland Kitchen atorvastatin (LIPITOR) 20 MG tablet Take 1 tablet (20 mg total) by mouth daily at 6 PM.  . Cholecalciferol (VITAMIN D3) 5000 units TABS 5,000 IU OTC vitamin D3 daily.  . clopidogrel (PLAVIX) 75 MG tablet Take 1 tablet (75 mg total) by mouth daily.  . fluticasone-salmeterol (ADVAIR HFA) 115-21 MCG/ACT inhaler Inhale 2 puffs into the lungs 2 (two) times daily.  Marland Kitchen levothyroxine (SYNTHROID, LEVOTHROID) 175 MCG tablet TAKE 1 TABLET BY MOUTH EVERY DAY  . Multiple Vitamins-Minerals (MULTIVITAMIN WITH MINERALS) tablet Take 1 tablet by  mouth daily.  . naproxen sodium (ALEVE) 220 MG tablet Take 220 mg by mouth as needed (pain).  . Turmeric 500 MG CAPS Take 500 mg by mouth daily.                   Objective:   Physical Exam  amb wf nad    07/09/2018       133  05/25/2018         134  09/17/2016         129  06/07/2015       134   11/14/14 127 lb (57.607 kg)  10/28/14 125 lb (56.7 kg)  10/03/14 128 lb (58.06 kg)    Vital signs reviewed - Note on arrival 02 sats  95% on RA         HEENT: nl dentition / oropharynx. Nl external ear canals without cough reflex -  Mild bilateral non-specific turbinate edema     NECK :  without JVD/Nodes/TM/ nl carotid upstrokes bilaterally   LUNGS: no acc muscle use,  Mid barrel  contour chest wall with bilateral  Distant bs s audible wheeze and  without cough on insp or exp maneuver and mild  Hyperresonant  to  percussion bilaterally     CV:  RRR  no s3 or murmur or increase in P2, and no edema   ABD:  soft and nontender with pos end  insp Hoover's  in the supine position. No bruits or organomegaly appreciated, bowel sounds nl  MS:   Nl gait/  ext warm without deformities, calf tenderness, cyanosis or clubbing No obvious joint restrictions   SKIN: warm and dry without lesions    NEURO:  alert, approp, nl sensorium with  no motor or cerebellar deficits apparent.                     Assessment & Plan:

## 2018-07-12 ENCOUNTER — Encounter: Payer: Self-pay | Admitting: Internal Medicine

## 2018-07-12 NOTE — Assessment & Plan Note (Signed)

## 2018-07-12 NOTE — Assessment & Plan Note (Signed)
Active smoker  -12/16/2012  s sign obst or even concavity to f/v loop  - 10/03/2014   try dulera 100 2bid  - PFTs 11/14/14 on dulera FEV1  2.33 (86%) ratio 78% and no change p saba and dlco 52% corrects to 65%  - Spirometry 06/07/2015  FEV1 1.74 (67%)  Ratio 76   PFT's   03/12/16   FEV1 2.09 (78 % ) ratio 0.80  p 0 % improvement from saba p ? prior to study with DLCO  48/53c % corrects to 84  % for alv volume  And no curvature  - 05/25/2018  After extensive coaching inhaler device,  effectiveness =    75% > changed to advair 115 hfa by insurance but preferred symb - 07/09/2018  After extensive coaching inhaler device,  effectiveness =    90% with elipta so rec trial of trelegy   Pt does not techically meet criteria for copd but has AB and actively smoking and reasonable to see if improves on trelegy and if so can step down to breo p trial of the samples given  Advised:  formulary restrictions will be an ongoing challenge for the forseable future and I would be happy to pick an alternative if the pt will first  provide me a list of them -  pt  will need to return here for training for any new device that is required eg dpi vs hfa vs respimat.    In the meantime we can always provide samples so that the patient never runs out of any needed respiratory medications.

## 2018-07-13 ENCOUNTER — Ambulatory Visit
Admission: RE | Admit: 2018-07-13 | Discharge: 2018-07-13 | Disposition: A | Discharge status: Home / Self Care | Payer: Medicare Other | Source: Ambulatory Visit | Attending: Family Medicine | Admitting: Family Medicine

## 2018-07-13 DIAGNOSIS — N6310 Unspecified lump in the right breast, unspecified quadrant: Secondary | ICD-10-CM

## 2018-07-13 DIAGNOSIS — N641 Fat necrosis of breast: Secondary | ICD-10-CM | POA: Diagnosis not present

## 2018-07-13 DIAGNOSIS — N6331 Unspecified lump in axillary tail of the right breast: Secondary | ICD-10-CM | POA: Diagnosis not present

## 2018-07-13 DIAGNOSIS — R928 Other abnormal and inconclusive findings on diagnostic imaging of breast: Secondary | ICD-10-CM

## 2018-07-15 ENCOUNTER — Encounter: Payer: Self-pay | Admitting: Adult Health

## 2018-07-15 ENCOUNTER — Ambulatory Visit: Payer: Medicare Other | Admitting: Adult Health

## 2018-07-15 NOTE — Progress Notes (Deleted)
[facility] [address] [place]. [place] [address] [phone]       OFFICE FOLLOW UP NOTE  [name] Date of Birth:  Nov 14, 1950 Medical Record Number:  [id number]   Reason for Referral:  hospital stroke follow up  CHIEF COMPLAINT:  No chief complaint on file.   HPI: 07/15/18 VISIT  [name] is a 68 year old female who is being seen today regarding strokelike episode in 02/2018.  She continues to do well from a stroke standpoint without new or recurring symptoms.  She continues on clopidogrel 75 mg daily without side effects of bleeding or bruising along with atorvastatin without side effects myalgias.  Blood pressure today ***.  Denies new or worsening stroke/TIA symptoms.     INITIAL VISIT 04/14/2018: Joy Williams is being seen today for initial visit in the office for stroke like episode s/p TPA versus complicated migraine on 42/59/5638. History obtained from patient and chart review. Reviewed all radiology images and labs personally.  Joy Williams an 68 y.o.femalepast medical history of lung cancer status post resection and chemotherapy, mitral valve regurgitation status post mitral valve replacement, Raynaud's disease, telangiectasia, COPD, chronic diastolic heart failure who was admitted with sudden onset vertigo, headache and gait imbalance after gettingoutfrom the shower.  CT had reviewed and was negative for acute infarct or hemorrhage.  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  Per notes, it was felt as though ICA proximal occlusion was likely due to artifact after images are further reviewed.  MRI brain reviewed and was negative for acute infarct.  Carotid Doppler showed bilateral ICA 1 to 39% stenosis with VAs antegrade.  2D echo showed  an EF of 55 to 60% without cardiac source of embolus identified.  Patient was on aspirin 81 mg PTA and was recommended DAPT for 3 weeks and then Plavix alone but per notes, patient was hesitant on continuing Plavix long-term.  LDL 108 and recommended initiating statin for HLD management.  All symptoms resolved prior to discharge and was unable to be determined if episode related to TIA versus aborted stroke versus complicated migraine.  Patient was discharged home in stable condition.  Patient is being seen today for hospital follow-up.  She states she continues to have occasional headaches with dizziness that occurs approximately 2-3 times per week where upon standing she will feel a strong rash of fullness sensation in her head with a dizziness sensation that lasts approximately 1 minute.  She will drink water at that time and the sensation subsides.  She states this been occurring for approximately 10 years now.  The symptoms she was feeling when she was admitted to the hospital with the same type of symptoms but much stronger than what she is used to.  As patient was called back by nurse from lobby to exam room, patient apparently had 1 of these episodes with loss of balance and headache sensation.  Orthostatics obtained initially with lying down 123/63, sitting 101/62 and standing 99/58.  Obtained sitting and standing pressures after patient sitting in chair for a while during visit with sitting pressure 110/70 and standing pressure 90/58.  At the end of the visit, patient had symptoms subsided without continued dizziness, pressure sensation or headache.  She does states she continues on Plavix without side effects of bleeding or bruising as she has completed  3 weeks DAPT.  Continues on atorvastatin without side effects myalgias.  Patient has returned back to all prior activities.  Denies new or worsening stroke/TIA symptoms.   ROS:   14 system review of systems performed and negative with exception of  headache and dizziness  PMH:  Past Medical History:  Diagnosis Date  . Allergy   . Asthma    pt denies this, but is on Dulera  . Chronic diastolic congestive heart failure ([place])   . 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 COPD (chronic obstructive pulmonary disease) ([place])   . Family history of adverse reaction to anesthesia    Mother- very sensitive to medication  . Heart murmur    MVP  . Hypothyroidism   . 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 MVP (mitral valve prolapse)   . PONV (postoperative nausea and vomiting)   . Raynaud's disease    Raynaud's  . Raynaud's syndrome   . 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 Severe mitral regurgitation 11/15/2014  . Shortness of breath dyspnea    with exertion  . STD (sexually transmitted disease)   . Telangiectasia   . Thyroid disease     PSH:  Past Surgical History:  Procedure Laterality Date  . BACK SURGERY     x 3  Disectomy  . BREAST BIOPSY Left   . CARDIAC CATHETERIZATION N/A 03/08/2016   Procedure: Right/Left Heart Cath and Coronary Angiography;  Surgeon: Sherren Mocha, MD;  Location: [facility];  Service: Cardiovascular;  Laterality: N/A;  . CLAVICLE SURGERY Left 2013   plate to left collar bone  . LAPAROSCOPY     ? reason-age 68   . LUNG CANCER SURGERY    . MITRAL VALVE REPAIR Right 06/27/2016   Procedure: MINIMALLY INVASIVE MITRAL VALVE REPLACEMENT;  Surgeon: Rexene Alberts, MD;  Location: [facility];  Service: Open Heart Surgery;  Laterality: Right;  . TEE WITHOUT CARDIOVERSION N/A 02/22/2016   Procedure: TRANSESOPHAGEAL  ECHOCARDIOGRAM (TEE);  Surgeon: Dorothy Spark, MD;  Location: [facility];  Service: Cardiovascular;  Laterality: N/A;  . TEE WITHOUT CARDIOVERSION N/A 06/27/2016   Procedure: TRANSESOPHAGEAL ECHOCARDIOGRAM (TEE);  Surgeon: Rexene Alberts, MD;  Location: [facility];  Service: Open Heart Surgery;  Laterality: N/A;  . TONSILLECTOMY    . VIDEO ASSISTED THORACOSCOPY (VATS)/WEDGE RESECTION Left 12/30/2014   Bronchoscopy, Mediastinoscopy, Left VATS for Wedge resection LUL x2 adn LLL x1 - Dr. Elenor Quinones at [facility]  . VOCAL CORD INJECTION Left 2017   injected with botox  . wrist surgery Left 2015   plate to wrist     Social History:  Social History   Socioeconomic History  . Marital status: Divorced    Spouse name: Not on file  . Number of children: Not on file  . Years of education: Not on file  . Highest education level: Not on file  Occupational History  . Not on file  Social Needs  . Financial resource strain: Not on file  . Food insecurity:    Worry: Not on file    Inability: Not on file  .  Transportation needs:    Medical: Not on file    Non-medical: Not on file  Tobacco Use  . Smoking status: Current Some Day Smoker    Packs/day: 0.25    Years: 24.00    Pack years: 6.00    Types: E-cigarettes, Cigarettes  . Smokeless tobacco: Never Used  Substance and Sexual Activity  . Alcohol use: Yes    Alcohol/week: 2.0 standard drinks    Types: 1 Shots of liquor, 1 Standard drinks or equivalent per week  . Drug use: No  . Sexual activity: Never    Partners: Male    Birth control/protection: Post-menopausal  Lifestyle  . Physical activity:    Days per week: Not on file    Minutes per session: Not on file  . Stress: Not on file  Relationships  . Social connections:    Talks on phone: Not on file    Gets together: Not on file    Attends religious service: Not on file    Active member of club or organization: Not on file    Attends meetings of clubs or organizations: Not on file     Relationship status: Not on file  . Intimate partner violence:    Fear of current or ex partner: Not on file    Emotionally abused: Not on file    Physically abused: Not on file    Forced sexual activity: Not on file  Other Topics Concern  . Not on file  Social History Narrative  . Not on file    Family History:  Family History  Problem Relation Age of Onset  . Mitral valve prolapse Mother   . Dementia Father   . Prostate cancer Father   . Mitral valve prolapse Brother   . Mitral valve prolapse Sister   . Colon cancer Neg Hx   . Colon polyps Neg Hx   . Rectal cancer Neg Hx   . Esophageal cancer Neg Hx   . Stomach cancer Neg Hx   . Heart attack Neg Hx     Medications:   Current Outpatient Medications on File Prior to Visit  Medication Sig Dispense Refill  . acetaminophen (TYLENOL) 500 MG tablet Take 1,000 mg by mouth every 6 (six) hours as needed for mild pain or headache.    Marland Kitchen atorvastatin (LIPITOR) 20 MG tablet Take 1 tablet (20 mg total) by mouth daily at 6 PM. 90 tablet 1  . Cholecalciferol (VITAMIN D3) 5000 units TABS 5,000 IU OTC vitamin D3 daily. 90 tablet 3  . clopidogrel (PLAVIX) 75 MG tablet Take 1 tablet (75 mg total) by mouth daily. 90 tablet 0  . fluticasone-salmeterol (ADVAIR HFA) 115-21 MCG/ACT inhaler Inhale 2 puffs into the lungs 2 (two) times daily. 1 Inhaler 12  . Fluticasone-Umeclidin-Vilant (TRELEGY ELLIPTA) 100-62.5-25 MCG/INH AEPB Inhale 1 puff into the lungs daily. 60 each 11  . levothyroxine (SYNTHROID, LEVOTHROID) 175 MCG tablet TAKE 1 TABLET BY MOUTH EVERY DAY 90 tablet 0  . Multiple Vitamins-Minerals (MULTIVITAMIN WITH MINERALS) tablet Take 1 tablet by mouth daily.    . naproxen sodium (ALEVE) 220 MG tablet Take 220 mg by mouth as needed (pain).    . Turmeric 500 MG CAPS Take 500 mg by mouth daily.     No current facility-administered medications on file prior to visit.     Allergies:   Allergies  Allergen Reactions  . Penicillins Hives     Unknown, occurred as a child Has patient had a PCN reaction causing  immediate rash, facial/tongue/throat swelling, SOB or lightheadedness with hypotension: Yes Has patient had a PCN reaction causing severe rash involving mucus membranes or skin necrosis: No Has patient had a PCN reaction that required hospitalization  Has patient had a PCN reaction occurring within the last 10 years: No If all of the above answers are "NO", then may proceed with Cephalosporin use. No     Physical Exam  There were no vitals filed for this visit. There is no height or weight on file to calculate BMI. No exam data present   General: well developed, well nourished, pleasant middle-aged Caucasian female, seated, in no evident distress Head: head normocephalic and atraumatic.   Neck: supple with no carotid or supraclavicular bruits Cardiovascular: regular rate and rhythm, no murmurs Musculoskeletal: no deformity Skin:  no rash/petichiae Vascular:  Normal pulses all extremities  Neurologic Exam Mental Status: Awake and fully alert. Oriented to place and time. Recent and remote memory intact. Attention span, concentration and fund of knowledge appropriate. Mood and affect appropriate.  Cranial Nerves: Fundoscopic exam reveals sharp disc margins. Pupils equal, briskly reactive to light. Extraocular movements full without nystagmus. Visual fields full to confrontation. Hearing intact. Facial sensation intact. Face, tongue, palate moves normally and symmetrically.  Motor: Normal bulk and tone. Normal strength in all tested extremity muscles. Sensory.: intact to touch , pinprick , position and vibratory sensation.  Coordination: Rapid alternating movements normal in all extremities. Finger-to-nose and heel-to-shin performed accurately bilaterally. Gait and Station: Arises from chair without difficulty. Stance is normal. Gait demonstrates normal stride length and balance. Able to heel, toe and tandem walk without  difficulty.  Romberg negative. Reflexes: 1+ and symmetric. Toes downgoing.    NIHSS  0 Modified Rankin  0    Diagnostic Data (Labs, Imaging, Testing)  CT HEAD WO CONTRAST 03/11/2018 IMPRESSION: 1. Small circumscribed hypoechoic focus in the left caudate head which may represent a late subacute or remote lacunar infarct. 2. Otherwise, no acute intracranial abnormality.  CT ANGIO HEAD W OR WO CONTRAST CT ANGIO NECK W OR WO CONTRAST 03/11/2018 IMPRESSION: Occlusive soft plaque/thrombus, proximal RIGHT ICA. No measurable lumen. Good opacification of the RIGHT ICA more distally, although unclear how antegrade flow is accomplished. No distal emboli in the RIGHT anterior intracranial circulation. Nonstenotic plaque LEFT carotid bifurcation.  MR BRAIN WO CONTRAST 03/12/2018 IMPRESSION: No acute intracranial finding. Mild chronic small-vessel ischemic change of the cerebellum and left thalamus.  ECHOCARDIOGRAM 03/12/2018 Impressions: - No cardiac source of emboli was indentified. Normal LV EF, grade   2 diastolic dysfunction. Bioprosthetic mitral valve appears in   good posiion with a trivial gradient.    ASSESSMENT: Joy Williams is a 68 y.o. year old female here with strokelike episode s/p TPA versus complicated migraine on 88/41/6606. Vascular risk factors include lung cancer s/p resection and chemotherapy, mitral valve regurgitation s/p mitral valve replacement, Raynard's disease, COPD, chronic diastolic heart failure and HLD.  Patient is being seen today for hospital follow-up and does continue to have 2-3 times per week symptoms most likely related to orthostatic hypotension but otherwise has been stable from a stroke standpoint.    PLAN:  1. Strokelike episode s/p TPA: Continue clopidogrel 75 mg daily  and atorvastatin for secondary stroke prevention. Maintain strict control of hypertension with blood pressure goal below 130/90, diabetes with hemoglobin A1c goal below  6.5% and cholesterol with LDL cholesterol (bad cholesterol) goal below 70 mg/dL.  I also advised the patient to eat a healthy diet with plenty  of whole grains, cereals, fruits and vegetables, exercise regularly with at least 30 minutes of continuous activity daily and maintain ideal body weight. 2. Likely orthostatic hypotension: Long discussion regarding conservative measures to avoid symptoms such as compression stockings, increasing fluid intake and moderately increased sodium intake along with ensuring she stands slowly and not to start ambulating until symptoms resolve. 3. HLD: Advised to continue current treatment regimen along with continued follow-up with PCP for future prescribing and monitoring of lipid panel     Follow up in 3 months or call earlier if needed   Greater than 50% of time during this 25 minute visit was spent on counseling, explanation of diagnosis of strokelike episode, reviewing risk factor management of HLD, planning of further management along with potential future management, and discussion with patient and family answering all questions.    Venancio Poisson, AGNP-BC  [facility] [address] [place], [place] [address]  Phone [phone] Fax [phone] Note: This document was prepared with digital dictation and possible smart phrase technology. Any transcriptional errors that result from this process are unintentional.

## 2018-08-08 ENCOUNTER — Other Ambulatory Visit: Payer: Self-pay | Admitting: Family Medicine

## 2018-09-11 ENCOUNTER — Other Ambulatory Visit: Payer: Self-pay | Admitting: Adult Health

## 2018-09-14 NOTE — Telephone Encounter (Signed)
I understand however, I am not comfortable treating pt for this condition this close to the incident, and less than 1 year out.  This needs to be Rfed by Neuro during this time of increased risk for reoccurrence.  - Dr Raliegh Scarlet

## 2018-09-15 ENCOUNTER — Other Ambulatory Visit: Payer: Self-pay

## 2018-09-15 ENCOUNTER — Other Ambulatory Visit: Payer: Self-pay | Admitting: Adult Health

## 2018-09-15 MED ORDER — CLOPIDOGREL BISULFATE 75 MG PO TABS: 75.0000 mg | ORAL_TABLET | Freq: Every day | ORAL | 3 refills | Status: DC

## 2018-09-15 MED ORDER — CLOPIDOGREL BISULFATE 75 MG PO TABS: 75.0000 mg | ORAL_TABLET | Freq: Every day | ORAL | 1 refills | Status: DC

## 2018-09-15 NOTE — Progress Notes (Signed)
Plavix 75 mg 90-day supply  x3 refills sent to her pharmacy.  From a stroke standpoint, it would be recommended for her to continue this medication indefinitely for secondary stroke prevention.  Once this refill completed, it will be requested for PCP to continue to refill long-term as patient will likely be seen for additional 1 to 2 visits within our office and if she remains stable from a stroke standpoint, she will only need to follow-up as needed.

## 2018-09-15 NOTE — Telephone Encounter (Signed)
I already placed 1 year refill

## 2018-09-18 NOTE — Progress Notes (Signed)
Ashok Pall!  Thank you for providing me with the long-term plan.  And absolutely, after 1 year from event and no recurrences or issues, I have no problem refilling it indefinitely for the patient per your recommendations (barring no contraindications arise.)     Thank you for understanding that since it had been a relatively recent event, I wished for you folks to be involved with her plan of care due to your expertise.  I appreciate your input and direction.  Please feel free to reach out to me anytime in the future.  Stay well!!  Suzi Roots

## 2018-09-24 DIAGNOSIS — H43811 Vitreous degeneration, right eye: Secondary | ICD-10-CM | POA: Diagnosis not present

## 2018-10-07 ENCOUNTER — Ambulatory Visit: Payer: Medicare Other | Admitting: Internal Medicine

## 2018-10-13 DIAGNOSIS — H43811 Vitreous degeneration, right eye: Secondary | ICD-10-CM | POA: Diagnosis not present

## 2018-10-20 ENCOUNTER — Telehealth: Payer: Self-pay

## 2018-10-20 NOTE — Telephone Encounter (Signed)
Spoke with the patient and she has been r/s to 11/18/2018 @ 9:45 am with Janett Billow. Patient is aware of new appt date and time.

## 2018-10-21 ENCOUNTER — Encounter: Payer: Self-pay | Admitting: *Deleted

## 2018-10-21 ENCOUNTER — Telehealth: Payer: Self-pay | Admitting: *Deleted

## 2018-10-21 NOTE — Telephone Encounter (Signed)
Virtual Visit Pre-Appointment Phone Call  "(Name), I am calling you today to discuss your upcoming appointment. We are currently trying to limit exposure to the virus that causes COVID-19 by seeing patients at home rather than in the office."  1. "What is the BEST phone number to call the day of the visit?" - include this in appointment notes-YES UPDATED  2. Do you have or have access to (through a family member/friend) a smartphone with video capability that we can use for your visit?" a. If yes - list this number in appt notes as cell (if different from BEST phone #) and list the appointment type as a VIDEO visit in appointment notes-YES UPDATED   3. Confirm consent - "In the setting of the current Covid19 crisis, you are scheduled for a ( video) visit with Dr. Meda Coffee on 10/22/18 at 1130.  Just as we do with many in-office visits, in order for you to participate in this visit, we must obtain consent.  If you'd like, I can send this to your mychart (if signed up) or email for you to review.  Otherwise, I can obtain your verbal consent now.  All virtual visits are billed to your insurance company just like a normal visit would be.  By agreeing to a virtual visit, we'd like you to understand that the technology does not allow for your provider to perform an examination, and thus may limit your provider's ability to fully assess your condition. If your provider identifies any concerns that need to be evaluated in person, we will make arrangements to do so.  Finally, though the technology is pretty good, we cannot assure that it will always work on either your or our end, and in the setting of a video visit, we may have to convert it to a phone-only visit.  In either situation, we cannot ensure that we have a secure connection.  Are you willing to proceed?" STAFF: Did the patient verbally acknowledge consent to telehealth visit? Document YES/NO here: YES VERBAL GIVEN AS WELL AS CONSENT TO TREAT SENT TO  PTS MYCHART TO REVIEW.  4. Advise patient to be prepared - "Two hours prior to your appointment, go ahead and check your blood pressure, pulse, oxygen saturation, and your weight (if you have the equipment to check those) and write them all down. When your visit starts, your provider will ask you for this information. If you have an Apple Watch or Kardia device, please plan to have heart rate information ready on the day of your appointment. Please have a pen and paper handy nearby the day of the visit as well."-YES AWARE TO TAKE BP/HR/Wt PRIOR TO VISIT  5. Give patient instructions for  smartphone Doxy.me as below if video visit (depending on what platform provider is using)-YES AWARE  6. Inform patient they will receive a phone call 15 minutes prior to their appointment time (may be from unknown caller ID) so they should be prepared to Joy Williams has been deemed a candidate for a follow-up tele-health visit to limit community exposure during the Covid-19 pandemic. I spoke with the patient via phone to ensure availability of phone/video source, confirm preferred email & phone number, and discuss instructions and expectations.  I reminded Joy Williams to be prepared with any vital sign and/or heart rhythm information that could potentially be obtained via home monitoring, at the time of her visit. I reminded Joy Williams to expect  a phone call prior to her visit.  Nuala Alpha, LPN 06/29/7987 21:19 PM    IF USING  [email or web address] - The patient will receive a link just prior to their visit by text.-YES AWARE     FULL LENGTH CONSENT FOR TELE-HEALTH VISIT   I hereby voluntarily request, consent and authorize [facility] and its employed or contracted physicians, physician assistants, nurse practitioners or other licensed health care professionals (the Practitioner), to provide me with telemedicine health care services (the Services") as deemed  necessary by the treating Practitioner. I acknowledge and consent to receive the Services by the Practitioner via telemedicine. I understand that the telemedicine visit will involve communicating with the Practitioner through live audiovisual communication technology and the disclosure of certain medical information by electronic transmission. I acknowledge that I have been given the opportunity to request an in-person assessment or other available alternative prior to the telemedicine visit and am voluntarily participating in the telemedicine visit.  I understand that I have the right to withhold or withdraw my consent to the use of telemedicine in the course of my care at any time, without affecting my right to future care or treatment, and that the Practitioner or I may terminate the telemedicine visit at any time. I understand that I have the right to inspect all information obtained and/or recorded in the course of the telemedicine visit and may receive copies of available information for a reasonable fee.  I understand that some of the potential risks of receiving the Services via telemedicine include:   Delay or interruption in medical evaluation due to technological equipment failure or disruption;  Information transmitted may not be sufficient (e.g. poor resolution of images) to allow for appropriate medical decision making by the Practitioner; and/or   In rare instances, security protocols could fail, causing a breach of personal health information.  Furthermore, I acknowledge that it is my responsibility to provide information about my medical history, conditions and care that is complete and accurate to the best of my ability. I acknowledge that Practitioner's advice, recommendations, and/or decision may be based on factors not within their control, such as incomplete or inaccurate data provided by me or distortions of diagnostic images or specimens that may result from electronic  transmissions. I understand that the practice of medicine is not an exact science and that Practitioner makes no warranties or guarantees regarding treatment outcomes. I acknowledge that I will receive a copy of this consent concurrently upon execution via email to the email address I last provided but may also request a printed copy by calling the office of [facility] [place].    I understand that my insurance will be billed for this visit.   I have read or had this consent read to me.  I understand the contents of this consent, which adequately explains the benefits and risks of the Services being provided via telemedicine.   I have been provided ample opportunity to ask questions regarding this consent and the Services and have had my questions answered to my satisfaction.  I give my informed consent for the services to be provided through the use of telemedicine in my medical care  By participating in this telemedicine visit I agree to the above. YES PT GAVE VERBAL CONSENT TO TREAT AS WELL AS CONSENT SENT TO HER MYCHART TO REVIEW.

## 2018-10-22 ENCOUNTER — Encounter: Payer: Self-pay | Admitting: *Deleted

## 2018-10-22 ENCOUNTER — Other Ambulatory Visit: Payer: Self-pay

## 2018-10-22 ENCOUNTER — Telehealth: Payer: Self-pay | Admitting: Licensed Clinical Social Worker

## 2018-10-22 ENCOUNTER — Encounter: Payer: Self-pay | Admitting: Cardiology

## 2018-10-22 ENCOUNTER — Telehealth (INDEPENDENT_AMBULATORY_CARE_PROVIDER_SITE_OTHER): Payer: Medicare Other | Admitting: Cardiology

## 2018-10-22 VITALS — Temp 97.7°F | Ht 66.5 in | Wt 131.0 lb

## 2018-10-22 DIAGNOSIS — Z952 Presence of prosthetic heart valve: Secondary | ICD-10-CM

## 2018-10-22 DIAGNOSIS — I6523 Occlusion and stenosis of bilateral carotid arteries: Secondary | ICD-10-CM | POA: Diagnosis not present

## 2018-10-22 DIAGNOSIS — I5189 Other ill-defined heart diseases: Secondary | ICD-10-CM

## 2018-10-22 DIAGNOSIS — E785 Hyperlipidemia, unspecified: Secondary | ICD-10-CM

## 2018-10-22 MED ORDER — ATORVASTATIN CALCIUM 20 MG PO TABS: 20.0000 mg | ORAL_TABLET | Freq: Every day | ORAL | 1 refills | Status: DC

## 2018-10-22 NOTE — Progress Notes (Signed)
Virtual Visit via Video Note   This visit type was conducted due to national recommendations for restrictions regarding the COVID-19 Pandemic (e.g. social distancing) in an effort to limit this patient's exposure and mitigate transmission in our community.  Due to her co-morbid illnesses, this patient is at least at moderate risk for complications without adequate follow up.  This format is felt to be most appropriate for this patient at this time.  All issues noted in this document were discussed and addressed.  A limited physical exam was performed with this format.  Please refer to the patient's chart for her consent to telehealth for [facility].   Date:  10/22/2018   ID:  Joy Williams, DOB 1951/02/24, MRN [id number]  Patient Location: Home Provider Location: Home  PCP:  Mellody Dance, DO  Cardiologist: Ena Dawley   Evaluation Performed:  Follow-Up Visit  Chief Complaint: No complaints, 1 year follow-up  History of Present Illness:    Joy Williams is a 68 y.o. 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 Roxy Manns, on 06/27/16. Her post operative course was uncomplicated. She was placed on coumadin.  Prior to undergoing valve surgery, she had a diagnostic LHC that showed normal coronaries. Her lung CA is followed at [facility]. Her most recent follow-up chest CT scan was performed in March 2017 at which time there was no sign of locally recurrent disease.  Carotid ultrasound was checked that showed 40-59% bilateral stenosis in 2018 this has improved to 1 to 39%  bilaterally in May and October 2019. She underwent echocardiography in May that showed hyperdynamic LVEF 65-70% normally functioning mitral valve with completely normal transmitral gradients.  10/22/2018 -this is 1 year follow-up, the patient is doing great, she is completely asymptomatic denies any lower extremity edema orthopnea, no palpitations, no chest pain, she is fully active and not limited in her activities.  She has been tolerating her medications.  She denies any bleeding.  The patient does not have symptoms concerning for COVID-19 infection (fever, chills, cough, or new shortness of breath).   Past Medical History:  Diagnosis Date  . Allergy   . Asthma    pt denies this, but is on Dulera  . Chronic diastolic congestive heart failure ([place])   . 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 COPD (chronic obstructive pulmonary disease) ([place])   . Family history of adverse reaction to anesthesia    Mother- very sensitive to medication  . Heart murmur    MVP  . Hypothyroidism   . Lung cancer ([place]) 12/30/2014   Synchronous primaries:  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, each treated by wedge resection with post-op adjuvant chemoRx at [facility]  . MVP (mitral valve prolapse)   . PONV (postoperative nausea and vomiting)   . Raynaud's disease    Raynaud's  . Raynaud's syndrome   . 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  Severe mitral regurgitation 11/15/2014  . Shortness of breath dyspnea    with exertion  . STD (sexually transmitted disease)   . Telangiectasia   . Thyroid disease    Past Surgical History:  Procedure Laterality Date  . BACK SURGERY     x 3  Disectomy  . BREAST BIOPSY Left   . CARDIAC CATHETERIZATION N/A  03/08/2016   Procedure: Right/Left Heart Cath and Coronary Angiography;  Surgeon: Sherren Mocha, MD;  Location: [place] [facility];  Service: Cardiovascular;  Laterality: N/A;  . CLAVICLE SURGERY Left 2013   plate to left collar bone  . LAPAROSCOPY     ? reason-age 15   . LUNG CANCER SURGERY    . MITRAL VALVE REPAIR Right 06/27/2016   Procedure: MINIMALLY INVASIVE MITRAL VALVE REPLACEMENT;  Surgeon: Rexene Alberts, MD;  Location: [place];  Service: Open Heart Surgery;  Laterality: Right;  . TEE WITHOUT CARDIOVERSION N/A 02/22/2016   Procedure: TRANSESOPHAGEAL ECHOCARDIOGRAM (TEE);  Surgeon: Dorothy Spark, MD;  Location: [place];  Service: Cardiovascular;  Laterality: N/A;  . TEE WITHOUT CARDIOVERSION N/A 06/27/2016   Procedure: TRANSESOPHAGEAL ECHOCARDIOGRAM (TEE);  Surgeon: Rexene Alberts, MD;  Location: [place];  Service: Open Heart Surgery;  Laterality: N/A;  . TONSILLECTOMY    . VIDEO ASSISTED THORACOSCOPY (VATS)/WEDGE RESECTION Left 12/30/2014   Bronchoscopy, Mediastinoscopy, Left VATS for Wedge resection LUL x2 adn LLL x1 - Dr. Elenor Quinones at [facility]  . VOCAL CORD INJECTION Left 2017   injected with botox  . wrist surgery Left 2015   plate to wrist      Current Meds  Medication Sig  . acetaminophen (TYLENOL) 500 MG tablet Take 1,000 mg by mouth every 6 (six) hours as needed for mild pain or headache.  Marland Kitchen atorvastatin (LIPITOR) 20 MG tablet Take 1 tablet (20 mg total) by mouth daily at 6 PM.  . Cholecalciferol (VITAMIN D3) 5000 units TABS 5,000 IU OTC vitamin D3 daily.  . clopidogrel (PLAVIX) 75 MG tablet Take 1 tablet (75 mg total) by mouth daily.  . Fluticasone-Umeclidin-Vilant (TRELEGY ELLIPTA) 100-62.5-25 MCG/INH AEPB Inhale 1 puff into the lungs daily.  Marland Kitchen levothyroxine (SYNTHROID, LEVOTHROID) 175 MCG tablet TAKE 1 TABLET BY MOUTH EVERY DAY  . Multiple Vitamins-Minerals (MULTIVITAMIN WITH MINERALS) tablet Take 1 tablet by mouth daily.  . naproxen sodium (ALEVE) 220 MG tablet Take  220 mg by mouth as needed (pain).  . Turmeric 500 MG CAPS Take 500 mg by mouth daily.     Allergies:   Penicillins   Social History   Tobacco Use  . Smoking status: Current Some Day Smoker    Packs/day: 0.25    Years: 24.00    Pack years: 6.00    Types: E-cigarettes, Cigarettes  . Smokeless tobacco: Never Used  Substance Use Topics  . Alcohol use: Yes    Alcohol/week: 2.0 standard drinks    Types: 1 Shots of liquor, 1 Standard drinks or equivalent per week  . Drug use: No     Family Hx: The patient's family history includes Dementia in her father; Mitral valve prolapse in her brother, mother, and sister; Prostate cancer in her father. There is no history of Colon cancer, Colon polyps, Rectal cancer, Esophageal cancer, Stomach cancer, or Heart attack.  ROS:   Please see the history of present illness.    All other systems reviewed and are negative.   Prior CV studies:   The following studies were reviewed today:   Labs/Other Tests and  Data Reviewed:    EKG:  No ECG reviewed.  Recent Labs: 03/11/2018: BUN 16; Creatinine, Ser 0.70; Hemoglobin 14.3; Platelets 159; Potassium 4.3; Sodium 135 04/28/2018: ALT 18   Recent Lipid Panel Lab Results  Component Value Date/Time   CHOL 170 03/12/2018 04:05 AM   CHOL 175 01/14/2017 08:49 AM   CHOL 177 10/28/2014 10:25 AM   TRIG 68 03/12/2018 04:05 AM   TRIG 115 10/28/2014 10:25 AM   HDL 48 03/12/2018 04:05 AM   HDL 59 01/14/2017 08:49 AM   HDL 70 10/28/2014 10:25 AM   CHOLHDL 3.5 03/12/2018 04:05 AM   LDLCALC 108 (H) 03/12/2018 04:05 AM   LDLCALC 92 01/14/2017 08:49 AM   LDLCALC 84 10/28/2014 10:25 AM    Wt Readings from Last 3 Encounters:  10/22/18 131 lb (59.4 kg)  07/09/18 133 lb (60.3 kg)  05/25/18 134 lb (60.8 kg)     Objective:    Vital Signs:  Temp 97.7 F (36.5 C)   Ht 5' 6.5" (1.689 m)   Wt 131 lb (59.4 kg)   BMI 20.83 kg/m    VITAL SIGNS:  reviewed  ASSESSMENT & PLAN:    1. H/o MVP with MR: s/p  recent minimally invasive mitral valve replacement with a bioprosthetic valve, per Dr. Roxy Manns, on 06/27/16. TEE during operation showed normal LVEF of 60-65%.  Echocardiogram has been stable, the last one in October 2019 with normal LVEF 55 to 60% and normal transmitral gradients.  She is completely euvolemic.  2.  Chronic diastolic CHF -previously with grade 2 diastolic dysfunction that has improved to grade 1 diastolic dysfunction that is age-appropriate, she is euvolemic and NYHA class I.  3. H/o Lung Cancer: treated by and followed at [facility]. Last CT scan showed no recurrence.  She is going to obtain her follow-up scan next months.  4. Bilateral carotid stenosis - moderate on carotid ultrasound in April 2018 with 40-59% stenosis bilaterally, this has improved to 1-39% B/L in 02/2018.   COVID-19 Education: The signs and symptoms of COVID-19 were discussed with the patient and how to seek care for testing (follow up with PCP or arrange E-visit).  The importance of social distancing was discussed today.  Time:   Today, I have spent 25 minutes with the patient with telehealth technology discussing the above problems.     Medication Adjustments/Labs and Tests Ordered: Current medicines are reviewed at length with the patient today.  Concerns regarding medicines are outlined above.   Tests Ordered: No orders of the defined types were placed in this encounter.   Medication Changes: No orders of the defined types were placed in this encounter.   Disposition:  Follow up in 6 month(s) with blood work at that time including CBC, INR, BNP, CMP, TSH and lipids.  Signed, Ena Dawley, MD  10/22/2018 11:54 AM    [place]

## 2018-10-22 NOTE — Addendum Note (Signed)
Addended by: Nuala Alpha on: 10/22/2018 12:42 PM   Modules accepted: Orders

## 2018-10-22 NOTE — Telephone Encounter (Signed)
CSW referred to assist patient with obtaining a BP cuff. CSW contacted patient to inform cuff will be delivered to home next week. Patient grateful for support and assistance. CSW available as needed. Raquel Sarna, [place], [place]

## 2018-10-22 NOTE — Patient Instructions (Signed)
Medication Instructions:   Your physician recommends that you continue on your current medications as directed. Please refer to the Current Medication list given to you today.   If you need a refill on your cardiac medications before your next appointment, please call your pharmacy.    Lab work:  IN 6 MONTHS--SAME DAY AS YOUR 6 MONTH FOLLOW-UP APPOINTMENT WITH DR NELSON, OR A FEW DAYS PRIOR TO THAT VISIT-WILL CHECK A CMET, CBC W DIFF, TSH, LIPIDS, AND INR LEVEL.  PLEASE COME FASTING TO THIS LAB APPOINTMENT.  WE WILL CALL YOU CLOSER TO THAT TIME FRAME TO ARRANGE BOTH YOUR FOLLOW-UP APPOINTMENT WITH DR Meda Coffee AND YOUR LAB APPOINTMENT.  If you have labs (blood work) drawn today and your tests are completely normal, you will receive your results only by: Marland Kitchen MyChart Message (if you have MyChart) OR . A paper copy in the mail If you have any lab test that is abnormal or we need to change your treatment, we will call you to review the results.   Testing/Procedures:  Your physician has requested that you have an echocardiogram. Echocardiography is a painless test that uses sound waves to create images of your heart. It provides your doctor with information about the size and shape of your heart and how well your heart's chambers and valves are working. This procedure takes approximately one hour. There are no restrictions for this procedure. YOU ARE DUE NOW TO HAVE THIS TEST DONE-OUR ECHO SCHEDULER FROM OUR OFFICE WILL BE CALLING YOU SOON TO ARRANGE THIS APPOINTMENT    Follow-Up: At [facility], you and your health needs are our priority.  As part of our continuing mission to provide you with exceptional heart care, we have created designated Provider Care Teams.  These Care Teams include your primary Cardiologist (physician) and Advanced Practice Providers (APPs -  Physician Assistants and Nurse Practitioners) who all work together to provide you with the care you need, when you need it.  Your  physician wants you to follow-up in: [place] will receive a reminder letter in the mail two months in advance. If you don't receive a letter, please call our office to schedule the follow-up appointment. YOU WILL NEED TO HAVE YOUR LABS DONE SAME DAY, OR A FEW DAYS PRIOR TO THIS APPOINTMENT PER DR NELSON.

## 2018-10-23 ENCOUNTER — Ambulatory Visit: Payer: Medicare Other | Admitting: Adult Health

## 2018-10-26 ENCOUNTER — Telehealth: Payer: Self-pay | Admitting: Family Medicine

## 2018-10-26 NOTE — Telephone Encounter (Signed)
Pt called states over the weekend she had respiratory issues & did a  E-visit ,she now wants to be tested for COVID-19 @ [facility] preferably.  --Forwarding message to medical asst-- glh

## 2018-10-27 NOTE — Telephone Encounter (Signed)
Patient c/o head congestion, low fevers, nonproductive fever, chest tightness with breathing at times, patient feels hot.  Patient had an e-visit over the week and was given prednisone, antibiotic, and tessalon perls -  Per patient medications are helping.  Patient would like to know if she should be tested for COVID.  Please review and advise. MPulliam, CMA/RT(R)

## 2018-10-27 NOTE — Telephone Encounter (Signed)
She has sx - yes she meets criteria.      Criteria for pt's going to one of our outpt Covid-19 testing sites are:  1) Pt's of  any age  with sx, irregardless of their medical hx.  2)  Asx pt's from high risk environments/ high density environments.  Thus pt's from assisted living facilities where pt's live in very close proximity to each other, or [place] personnel living on a base etc.   3) Asx covid-19 convalescent plasma donors.    - I CCed [place] to make sure we are all on the same page and sending a consistent message to pt's according to our [place] guidelines.   Also, I don't think we providers even need to be notified or consulted, as if they meet criteria and ask for testing, please activate the protocol by sending message to the [facility] community test pool etc.   Now, if pt has moderate to severe sx OR any concerning sx that you feel you need to ask [place] about, please always, don't hesitate to ask.   Thanks guys!

## 2018-10-28 ENCOUNTER — Telehealth: Payer: Self-pay

## 2018-10-28 ENCOUNTER — Telehealth: Payer: Self-pay | Admitting: General Practice

## 2018-10-28 ENCOUNTER — Other Ambulatory Visit: Payer: Medicare Other

## 2018-10-28 DIAGNOSIS — R6889 Other general symptoms and signs: Secondary | ICD-10-CM | POA: Diagnosis not present

## 2018-10-28 DIAGNOSIS — Z20822 Contact with and (suspected) exposure to covid-19: Secondary | ICD-10-CM

## 2018-10-28 NOTE — Telephone Encounter (Signed)
Patient called and is having positive symptoms that include nonproductive cough, low grade fever and chest discomfort with breathing.  Dr. Raliegh Scarlet recommends COVID testing for the patient.   Joy Williams 25-Mar-2051  [id number]  Medicare  [id number]

## 2018-10-28 NOTE — Telephone Encounter (Signed)
Good Morning Dr Raliegh Scarlet, Thank you for the CC and I agree with your assessment. Thanks! Valetta Fuller

## 2018-10-28 NOTE — Addendum Note (Signed)
Addended by: Denman George on: 10/28/2018 12:52 PM   Modules accepted: Orders

## 2018-10-28 NOTE — Telephone Encounter (Signed)
Pt has been scheduled for Covid-19 testing   Scheduled with pt directly.   Pt was referred by: Mellody Dance, DO

## 2018-10-30 LAB — NOVEL CORONAVIRUS, NAA: SARS-CoV-2, NAA: NOT DETECTED

## 2018-11-02 ENCOUNTER — Telehealth (HOSPITAL_COMMUNITY): Payer: Self-pay | Admitting: *Deleted

## 2018-11-02 NOTE — Telephone Encounter (Signed)
COVID-19 Pre-Screening Questions:  . Do you currently have a fever? Recently  (yes = cancel and refer to pcp for e-visit) . Have you recently travelled on a cruise, internationally, or to [place], [place], [place], [place], [place], or [place], [place] [employer]) ? NO (yes = cancel, stay home, monitor symptoms, and contact pcp or initiate e-visit if symptoms develop) . Have you been in contact with someone that is currently pending confirmation of Covid19 testing or has been confirmed to have the [place] virus?  NO (yes = cancel, stay home, away from tested individual, monitor symptoms, and contact pcp or initiate e-visit if symptoms develop) . Are you currently experiencing fatigue or cough? Recently  (yes = pt should be prepared to have a mask placed at the time of their visit).   Patient has had recent fever, upper respiratory symptoms.  COVID Negative testing, however would like to reschedule to be safe.  Rescheduled for Thursday 25th.    Candice Tobey

## 2018-11-03 ENCOUNTER — Telehealth: Payer: Self-pay | Admitting: Family Medicine

## 2018-11-03 ENCOUNTER — Other Ambulatory Visit (HOSPITAL_COMMUNITY): Payer: Medicare Other

## 2018-11-03 ENCOUNTER — Other Ambulatory Visit: Payer: Self-pay

## 2018-11-03 NOTE — Telephone Encounter (Signed)
Refill request sent to Dr. Raliegh Scarlet for review. MPulliam, CMA/RT(R)

## 2018-11-03 NOTE — Telephone Encounter (Signed)
She did not see me for this and appears she saw her cardiologist I believe.  Not sure if there is somebody additional she saw for an acute URI visit or what.  However, she would need to contact that provider to obtain more meds and/or make an acute follow-up office visit with me and I would have to assess her symptoms and see if that refill was appropriate.     Thus not only which she need an acute visit to discuss her currently bothersome upper respiratory symptoms and need for Tessalon capsules, she also needs a chronic follow-up office visit with [place].  Last appt w/ [place] was last year- never did follow-up as instructed.

## 2018-11-03 NOTE — Telephone Encounter (Signed)
Patient called and states that she has an E-visit on 6/5 or 10/24/2018 for upper respiratory symptoms and was given Benzonatate 100 mg capsules for her cough  -  Per patient this medication has helped with the cough but sh is out and is requesting a refills.  Please review and advise.   LOV 04/27/2018

## 2018-11-03 NOTE — Telephone Encounter (Signed)
Patient called states she did a E-visit on 6/5 or 6/6 thru provider on [place] website w/ Dr. Azalee Course & was prescribed a cough medicine that is working--- Patient request a refill on :   Benzonatate 100 MG capsules.  If approved please send refill order to:    [id number] [address], [place]. [phone] (Phone) [phone] (Fax)   --Dion Body

## 2018-11-05 ENCOUNTER — Other Ambulatory Visit: Payer: Self-pay

## 2018-11-05 ENCOUNTER — Ambulatory Visit (INDEPENDENT_AMBULATORY_CARE_PROVIDER_SITE_OTHER): Payer: Medicare Other | Admitting: Family Medicine

## 2018-11-05 ENCOUNTER — Other Ambulatory Visit: Payer: Self-pay | Admitting: Family Medicine

## 2018-11-05 ENCOUNTER — Encounter: Payer: Self-pay | Admitting: Family Medicine

## 2018-11-05 VITALS — BP 126/62 | HR 70 | Temp 97.8°F | Ht 66.5 in

## 2018-11-05 DIAGNOSIS — R067 Sneezing: Secondary | ICD-10-CM | POA: Diagnosis not present

## 2018-11-05 DIAGNOSIS — J310 Chronic rhinitis: Secondary | ICD-10-CM

## 2018-11-05 DIAGNOSIS — R05 Cough: Secondary | ICD-10-CM | POA: Diagnosis not present

## 2018-11-05 DIAGNOSIS — R053 Chronic cough: Secondary | ICD-10-CM

## 2018-11-05 MED ORDER — FLUTICASONE PROPIONATE 50 MCG/ACT NA SUSP: NASAL | 2 refills | Status: DC

## 2018-11-05 MED ORDER — AZELASTINE HCL 0.1 % NA SOLN: 2.0000 | Freq: Two times a day (BID) | NASAL | 12 refills | Status: DC

## 2018-11-05 MED ORDER — BENZONATATE 100 MG PO CAPS: 100.0000 mg | ORAL_CAPSULE | Freq: Three times a day (TID) | ORAL | 0 refills | Status: DC | PRN

## 2018-11-05 NOTE — Progress Notes (Signed)
Telehealth office visit note for Joy Williams, D.O- at [facility] at [facility]   I connected with current patient today and verified that I am speaking with the correct person using two identifiers.   . Location of the patient: Home . Location of the provider: Office Only the patient (+/- their family members at pt's discretion) and myself were participating in the encounter    - This visit type was conducted due to national recommendations for restrictions regarding the COVID-19 Pandemic (e.g. social distancing) in an effort to limit this patient's exposure and mitigate transmission in our community.  This format is felt to be most appropriate for this patient at this time.   - The patient did not have access to video technology or had technical difficulties with video requiring transitioning to audio format only. - No physical exam could be performed with this format, beyond that communicated to [place] by the patient/ family members as noted.   - Additionally my office staff/ schedulers discussed with the patient that there may be a monetary charge related to this service, depending on their medical insurance.   The patient expressed understanding, and agreed to proceed.       History of Present Illness:  June 6th had online telehelath [facility] visit.- started feeling horrible.  Fevers- not above 99.0- pt runs normally 97- she felt on fire.   Had URI sx.  [facility]ed [facility]-  Was given methylprednisolone taper, doxy abx 10d and benzonate tabs.   Now lingering RN, cough- dry, sinus headache, no fevers, and particularly not feeling horrible but just not feeling herself and well. -We sent her for COVID testing due to the cough and it was negative.  Patient is is exacerbated by her symptoms.   Impression and Recommendations:    1. Persistent dry cough   2. Other rhinitis   3. Sneeze     -Since patient not running fevers and cover testing is negative, we started discussing  her environmental exposures.  She has not changed the air filters in her home in the 2 and half years that she is lived there.  Advised to change them and improve the air quality at home. -Sounds like this might be seasonal environmental allergic reaction-exposure and chronic sinusitis causing chronic dry cough. -Meds per orders. -Patient will also take an over-the-counter Zyrtec or Allegra or Claritin daily.  - As part of my medical decision making, I reviewed the following data within the [place] History obtained from pt /family, CMA notes reviewed and incorporated if applicable, Labs reviewed, Radiograph/ tests reviewed if applicable and OV notes from prior OV's with me, as well as other specialists she/he has seen since seeing me last, were all reviewed and used in my medical decision making process today.   - Additionally, discussion had with patient regarding txmnt plan, and their biases/concerns about that plan were used in my medical decision making today.   - The patient agreed with the plan and demonstrated an understanding of the instructions.   No barriers to understanding were identified.   - Red flag symptoms and signs discussed in detail.  Patient expressed understanding regarding what to do in case of emergency\ urgent symptoms.  The patient was advised to call back or seek an in-person evaluation if the symptoms worsen or if the condition fails to improve as anticipated.   Return if symptoms worsen or fail to improve; and in 3 to 4 weeks follow-up  for this issue, for Patient will keep her chronic follow-up on 6/29 to review labs and chronic issues.    Meds ordered this encounter  Medications  . benzonatate (TESSALON) 100 MG capsule    Sig: Take 1 capsule (100 mg total) by mouth 3 (three) times daily as needed.    Dispense:  30 capsule    Refill:  0  . azelastine (ASTELIN) 0.1 % nasal spray    Sig: Place 2 sprays into both nostrils 2 (two) times daily. Use in  each nostril as directed    Dispense:  30 mL    Refill:  12  . fluticasone (FLONASE) 50 MCG/ACT nasal spray    Sig: 1 spray each nostril following sinus rinses twice daily    Dispense:  16 g    Refill:  2    Medications Discontinued During This Encounter  Medication Reason  . benzonatate (TESSALON) 100 MG capsule Reorder     I provided 21+ minutes of non-face-to-face time during this encounter,with over 50% of the time in direct counseling on patients medical conditions/ medical concerns.  Additional time was spent with charting and coordination of care after the actual visit commenced.   Note:  This note was prepared with assistance of Dragon voice recognition software. Occasional wrong-word or sound-a-like substitutions may have occurred due to the inherent limitations of voice recognition software.   Mellody Dance, DO  11/05/2018 2:11 PM      Patient Care Team    Relationship Specialty Notifications Start End  Mellody Dance, DO PCP - General Family Medicine  01/06/17   Dorothy Spark, MD Consulting Physician Cardiology  01/06/17   Tanda Rockers, MD Consulting Physician Pulmonary Disease  01/06/17   Hurley Cisco, MD Consulting Physician Rheumatology  01/06/17   Lerry Paterson, MD Referring Physician   01/06/17    Comment: lung ca Surgeon  Lissa Morales, MD Referring Physician Internal Medicine  01/06/17    Comment: Oncologist- Chemo doc  [name], MD Consulting Physician Infectious Diseases  03/17/18    Comment: + RPR test     -Vitals obtained; medications/ allergies reconciled;  personal medical, social, Sx etc.histories were updated by CMA, reviewed by me and are reflected in chart   Patient Active Problem List   Diagnosis Date Noted  . Tobacco use disorder 01/22/2017    Priority: High  . Tobacco abuse counseling 01/22/2017    Priority: High  . Chronic obstructive pulmonary disease ([place])     Priority: High  . 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 Malignant neoplasm of left lung ([place]) 12/30/2014    Priority: High  . Malignant neoplasm of overlapping sites of left lung ([place]) 12/06/2014    Priority: High  . Scleroderma ([place]) 10/03/2014    Priority: High  . Age-related osteoporosis without current pathological fracture 02/20/2017    Priority: Medium  . Estrogen deficiency 01/14/2017    Priority: Medium  . H/O cardiac catheterization- in prep for lung ca sx 01/06/2017    Priority: Medium  . Hypothyroidism 01/06/2017    Priority: Medium  . 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 Chronic diastolic congestive heart failure ([place])     Priority: Medium  . Asthmatic bronchitis , chronic ([place]) 10/03/2014    Priority: Medium  . Current Cigarette smoker 10/03/2014    Priority: Medium  . 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 Neoplastic malignant related fatigue 01/22/2017    Priority: Low  . Vitamin D insufficiency 01/22/2017    Priority: Low  . Previous back surgeries 01/06/2017    Priority: Low  . Antineoplastic chemotherapy induced anemia 05/05/2015    Priority: Low  . Raynaud's disease 01/19/2015    Priority: Low  . Severe mitral regurgitation 11/15/2014    Priority: Low  . Multiple pulmonary nodules 10/03/2014    Priority: Low  . 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 On statin therapy 04/27/2018  . Memory loss or impairment 04/27/2018  . Complicated migraine 28/31/5176  . Hyperlipidemia LDL goal <70 03/12/2018  . 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 Dysuria 10/14/2017  . Abnormal finding on urinalysis 10/14/2017  . Pharyngitis 10/14/2017  . Closed nondisplaced fracture of base of fifth metacarpal bone of right hand  05/27/2017  . Decreased range of motion of right ankle 05/27/2017  . Pain and swelling of right wrist 05/27/2017  . ASCUS of cervix with negative high risk HPV 01/29/2017  . History of epistaxis 03/30/2015  . Lung cancer ([place]) 12/30/2014  . Nonintractable episodic headache 12/06/2014     Current Meds  Medication Sig  . acetaminophen (TYLENOL) 500 MG tablet Take 1,000 mg by mouth every 6 (six) hours as needed for mild pain or headache.  Marland Kitchen atorvastatin (LIPITOR) 20 MG tablet Take 1 tablet (20 mg total) by mouth daily at 6 PM.  . benzonatate (TESSALON) 100 MG capsule Take 1 capsule (100 mg total) by mouth 3 (three) times daily as needed.  . Cholecalciferol (VITAMIN D3) 5000 units TABS 5,000 IU OTC vitamin D3 daily.  . clopidogrel (PLAVIX) 75 MG tablet Take 1 tablet (75 mg total) by mouth daily.  . Fluticasone-Umeclidin-Vilant (TRELEGY ELLIPTA) 100-62.5-25 MCG/INH AEPB Inhale 1 puff into the lungs daily.  Marland Kitchen levothyroxine (SYNTHROID) 175 MCG tablet Take 1 tablet (175 mcg total) by mouth daily. Needs office visit for further refills  . Multiple Vitamins-Minerals (MULTIVITAMIN WITH MINERALS) tablet Take 1 tablet by mouth daily.  . naproxen sodium (ALEVE) 220 MG tablet Take 220 mg by mouth as needed (pain).  . Turmeric 500 MG CAPS Take 500 mg by mouth daily.  . [DISCONTINUED] benzonatate (TESSALON) 100 MG capsule Take 1 capsule by mouth 3 (three) times daily as needed.     Allergies:  Allergies  Allergen Reactions  . Penicillins Hives    Unknown, occurred as a child Has patient had a PCN reaction causing immediate rash, facial/tongue/throat swelling, SOB or lightheadedness with hypotension: Yes Has patient had a PCN reaction causing severe rash involving mucus membranes or skin necrosis: No Has patient had a PCN reaction that required hospitalization  Has patient had a PCN reaction occurring within the last 10 years: No If all of the above answers are "NO", then may proceed with  Cephalosporin use. No     ROS:  See above HPI for pertinent positives and negatives   Objective:   Blood pressure 126/62, pulse 70, temperature 97.8 F (36.6 C), height 5' 6.5" (1.689 m).  (if some vitals are omitted, this means that patient was UNABLE to obtain them even though they were asked to get them prior to OV today.  They were asked to call us at their earliest convenience with these once obtained. )  General:  A & O * 3; sounds in no acute distress; in usual state of health.  Skin: Pt confirms warm and dry extremities and pink fingertips HEENT: Pt confirms lips non-cyanotic Chest: Patient confirms normal chest excursion and movement Respiratory: speaking in full sentences, no conversational dyspnea; patient confirms no use of accessory muscles Psych: insight appears good, mood- appears full

## 2018-11-11 ENCOUNTER — Telehealth (HOSPITAL_COMMUNITY): Payer: Self-pay | Admitting: *Deleted

## 2018-11-11 ENCOUNTER — Other Ambulatory Visit: Payer: Medicare Other

## 2018-11-11 ENCOUNTER — Telehealth: Payer: PRIVATE HEALTH INSURANCE | Admitting: Cardiology

## 2018-11-11 ENCOUNTER — Other Ambulatory Visit: Payer: Self-pay

## 2018-11-11 DIAGNOSIS — Z79899 Other long term (current) drug therapy: Secondary | ICD-10-CM

## 2018-11-11 DIAGNOSIS — E559 Vitamin D deficiency, unspecified: Secondary | ICD-10-CM | POA: Diagnosis not present

## 2018-11-11 DIAGNOSIS — I639 Cerebral infarction, unspecified: Secondary | ICD-10-CM | POA: Diagnosis not present

## 2018-11-11 DIAGNOSIS — M810 Age-related osteoporosis without current pathological fracture: Secondary | ICD-10-CM | POA: Diagnosis not present

## 2018-11-11 DIAGNOSIS — Z820 Family history of epilepsy and other diseases of the nervous system: Secondary | ICD-10-CM | POA: Diagnosis not present

## 2018-11-11 DIAGNOSIS — R2990 Unspecified symptoms and signs involving the nervous system: Secondary | ICD-10-CM

## 2018-11-11 DIAGNOSIS — D6481 Anemia due to antineoplastic chemotherapy: Secondary | ICD-10-CM | POA: Diagnosis not present

## 2018-11-11 DIAGNOSIS — M2550 Pain in unspecified joint: Secondary | ICD-10-CM | POA: Diagnosis not present

## 2018-11-11 DIAGNOSIS — E785 Hyperlipidemia, unspecified: Secondary | ICD-10-CM | POA: Diagnosis not present

## 2018-11-11 NOTE — Telephone Encounter (Signed)

## 2018-11-11 NOTE — Addendum Note (Signed)
Addended by: Fonnie Mu on: 11/11/2018 09:26 AM   Modules accepted: Orders

## 2018-11-12 ENCOUNTER — Ambulatory Visit (HOSPITAL_COMMUNITY): Payer: Medicare Other | Attending: Cardiology

## 2018-11-12 DIAGNOSIS — Z952 Presence of prosthetic heart valve: Secondary | ICD-10-CM | POA: Insufficient documentation

## 2018-11-12 DIAGNOSIS — I5189 Other ill-defined heart diseases: Secondary | ICD-10-CM | POA: Diagnosis not present

## 2018-11-12 LAB — TSH: TSH: 1.79 u[IU]/mL (ref 0.450–4.500)

## 2018-11-12 LAB — LIPID PANEL
Chol/HDL Ratio: 2.1 ratio (ref 0.0–4.4)
Cholesterol, Total: 150 mg/dL (ref 100–199)
HDL: 70 mg/dL (ref >39)
LDL Calculated: 64 mg/dL (ref 0–99)
Triglycerides: 78 mg/dL (ref 0–149)
VLDL Cholesterol Cal: 16 mg/dL (ref 5–40)

## 2018-11-12 LAB — B12 AND FOLATE PANEL
Folate: 12.8 ng/mL (ref >3.0)
Vitamin B-12: 381 pg/mL (ref 232–1245)

## 2018-11-12 LAB — SEDIMENTATION RATE: Sed Rate: 27 mm/hr (ref 0–40)

## 2018-11-12 LAB — LDL CHOLESTEROL, DIRECT: LDL Direct: 68 mg/dL (ref 0–99)

## 2018-11-12 LAB — T4, FREE: Free T4: 1.16 ng/dL (ref 0.82–1.77)

## 2018-11-12 LAB — VITAMIN D 25 HYDROXY (VIT D DEFICIENCY, FRACTURES): Vit D, 25-Hydroxy: 23.7 ng/mL — ABNORMAL LOW (ref 30.0–100.0)

## 2018-11-16 ENCOUNTER — Encounter: Payer: Self-pay | Admitting: Family Medicine

## 2018-11-16 ENCOUNTER — Other Ambulatory Visit: Payer: Self-pay

## 2018-11-16 ENCOUNTER — Ambulatory Visit (INDEPENDENT_AMBULATORY_CARE_PROVIDER_SITE_OTHER): Payer: Medicare Other | Admitting: Family Medicine

## 2018-11-16 VITALS — BP 124/68 | HR 66 | Temp 98.0°F | Ht 66.5 in | Wt 131.0 lb

## 2018-11-16 DIAGNOSIS — C3482 Malignant neoplasm of overlapping sites of left bronchus and lung: Secondary | ICD-10-CM

## 2018-11-16 DIAGNOSIS — E039 Hypothyroidism, unspecified: Secondary | ICD-10-CM

## 2018-11-16 DIAGNOSIS — J449 Chronic obstructive pulmonary disease, unspecified: Secondary | ICD-10-CM | POA: Diagnosis not present

## 2018-11-16 DIAGNOSIS — Z85118 Personal history of other malignant neoplasm of bronchus and lung: Secondary | ICD-10-CM | POA: Diagnosis not present

## 2018-11-16 DIAGNOSIS — E559 Vitamin D deficiency, unspecified: Secondary | ICD-10-CM

## 2018-11-16 DIAGNOSIS — Z9889 Other specified postprocedural states: Secondary | ICD-10-CM

## 2018-11-16 DIAGNOSIS — E785 Hyperlipidemia, unspecified: Secondary | ICD-10-CM | POA: Diagnosis not present

## 2018-11-16 DIAGNOSIS — I6523 Occlusion and stenosis of bilateral carotid arteries: Secondary | ICD-10-CM | POA: Diagnosis not present

## 2018-11-16 DIAGNOSIS — M810 Age-related osteoporosis without current pathological fracture: Secondary | ICD-10-CM

## 2018-11-16 MED ORDER — VITAMIN D (ERGOCALCIFEROL) 1.25 MG (50000 UNIT) PO CAPS: ORAL_CAPSULE | ORAL | 3 refills | Status: DC

## 2018-11-16 NOTE — Progress Notes (Signed)
Virtual / live video office visit note for [employer], D.O- Primary Care Physician at [facility]   I connected with current patient today and beyond visually recognizing the correct individual, I verified that I am speaking with the correct person using two identifiers.  . Location of the patient: Home . Location of the provider: Office Only the patient (+/- their family members at pt's discretion) and myself were participating in the encounter    - This visit type was conducted due to national recommendations for restrictions regarding the COVID-19 Pandemic (e.g. social distancing) in an effort to limit this patient's exposure and mitigate transmission in our community.  This format is felt to be most appropriate for this patient at this time.   - The patient did have access to video technology today     - No physical exam could be performed with this format, beyond that communicated to [place] by the patient/ family members as noted.   - Additionally my office staff/ schedulers discussed with the patient that there may be a monetary charge related to this service, depending on patient's medical insurance.   The patient expressed understanding, and agreed to proceed.       History of Present Illness:  -Patient here today since recently had blood work- wants to discuss- and here to review chronic conditions.   -Even though she has significant medical history of lung cancer status-post resection, severe COPD, still smoking, and chronic diastolic congestive heart failure, patient denies any shortness of breath or complaints today.  - None of her activities of daily living are hindered and she is able to do whatever she would like to do without symptoms -She states emotionally is been a little bit difficult during the COVID crisis to not see family as much and and be close to them etc.   However she is seeing them and using masks and trying to do so while outdoors.  She is taking  precautions.  Denies emotional duress/ depression   -Patient's ongoing chronic sinsusitis/seasonal allergies- recently changed her medication regiment to better improve her symptoms.   Much improved since starting the Astelin nasal spray in addition to the Flonase after sinus rinses twice daily.  Patient is very happy with how her symptoms have improved on this treatment regiment.  She does not have as much postnasal drip and hence not as much cough during the day. Occasionally she will wake up coughing in the middle of the night-since she is laying flat and has postnasal drip.   She has no increased shortness of breath, no peripheral edema, no orthopnea or paroxysmal nocturnal dyspnea  -Patient with history of lung cancer status post surgical resection-wedge x2 and adjunct chemo with longstanding and ongoing tobacco abuse.  Patient still smoking.  No desire to quit at current state.  She says covid too stressful and she needs some pleaseure in life   -COPD-stable.  Patient going for PFTs and going to see Dr. Melvyn Novas her pulmonologist in the near future. No complaints/ concerns today    Recent Results (from the past 2160 hour(s))  Novel Coronavirus, NAA ([facility])     Status: None   Collection Time: 10/28/18 12:57 PM  Result Value Ref Range   SARS-CoV-2, NAA Not Detected Not Detected    Comment: Testing was performed using the cobas(R) SARS-CoV-2 test. This test was developed and its performance characteristics determined by [employer]. This test has not been FDA cleared or approved. This  test has been authorized by FDA under an Emergency Use Authorization (EUA). This test is only authorized for the duration of time the declaration that circumstances exist justifying the authorization of the emergency use of in vitro diagnostic tests for detection of SARS-CoV-2 virus and/or diagnosis of COVID-19 infection under section 564(b)(1) of the Act, 21 U.S.C. 008QPY-1(P)(5), unless the  authorization is terminated or revoked sooner. When diagnostic testing is negative, the possibility of a false negative result should be considered in the context of a patient's recent exposures and the presence of clinical signs and symptoms consistent with COVID-19. An individual without symptoms of COVID-19 and who is not shedding SARS-CoV-2 virus would expect to have a negati ve (not detected) result in this assay.   B12 and Folate Panel     Status: None   Collection Time: 11/11/18  9:42 AM  Result Value Ref Range   Vitamin B-12 381 232 - 1,245 pg/mL   Folate 12.8 >3.0 ng/mL    Comment: A serum folate concentration of less than 3.1 ng/mL is considered to represent clinical deficiency.   Sedimentation rate     Status: None   Collection Time: 11/11/18  9:42 AM  Result Value Ref Range   Sed Rate 27 0 - 40 mm/hr  TSH     Status: None   Collection Time: 11/11/18  9:42 AM  Result Value Ref Range   TSH 1.790 0.450 - 4.500 uIU/mL  VITAMIN D 25 Hydroxy (Vit-D Deficiency, Fractures)     Status: Abnormal   Collection Time: 11/11/18  9:42 AM  Result Value Ref Range   Vit D, 25-Hydroxy 23.7 (L) 30.0 - 100.0 ng/mL    Comment: Vitamin D deficiency has been defined by the [place] and an Endocrine Society practice guideline as a level of serum 25-OH vitamin D less than 20 ng/mL (1,2). The Endocrine Society went on to further define vitamin D insufficiency as a level between 21 and 29 ng/mL (2). 1. IOM (Institute of Medicine). 2010. Dietary reference    intakes for calcium and D. [place]: The    [employer]. 2. Holick MF, Binkley Dawson, Bischoff-Ferrari HA, et al.    Evaluation, treatment, and prevention of vitamin D    deficiency: an Endocrine Society clinical practice    guideline. JCEM. 2011 Jul; 96(7):1911-30.   T4, free     Status: None   Collection Time: 11/11/18  9:42 AM  Result Value Ref Range   Free T4 1.16 0.82 - 1.77 ng/dL  Lipid panel      Status: None   Collection Time: 11/11/18  9:46 AM  Result Value Ref Range   Cholesterol, Total 150 100 - 199 mg/dL   Triglycerides 78 0 - 149 mg/dL   HDL 70 >39 mg/dL   VLDL Cholesterol Cal 16 5 - 40 mg/dL   LDL Calculated 64 0 - 99 mg/dL   Chol/HDL Ratio 2.1 0.0 - 4.4 ratio    Comment:                                   T. Chol/HDL Ratio                                             Men  Women  1/2 Avg.Risk  3.4    3.3                                   Avg.Risk  5.0    4.4                                2X Avg.Risk  9.6    7.1                                3X Avg.Risk 23.4   11.0   Direct LDL     Status: None   Collection Time: 11/11/18  9:46 AM  Result Value Ref Range   LDL Direct 68 0 - 99 mg/dL  Novel Coronavirus, NAA ([facility])     Status: None   Collection Time: 11/19/18 12:07 PM  Result Value Ref Range   SARS-CoV-2, NAA Not Detected Not Detected    Comment: This test was developed and its performance characteristics determined by [employer]. This test has not been FDA cleared or approved. This test has been authorized by FDA under an Emergency Use Authorization (EUA). This test is only authorized for the duration of time the declaration that circumstances exist justifying the authorization of the emergency use of in vitro diagnostic tests for detection of SARS-CoV-2 virus and/or diagnosis of COVID-19 infection under section 564(b)(1) of the Act, 21 U.S.C. 093OIZ-1(I)(4), unless the authorization is terminated or revoked sooner. When diagnostic testing is negative, the possibility of a false negative result should be considered in the context of a patient's recent exposures and the presence of clinical signs and symptoms consistent with COVID-19. An individual without symptoms of COVID-19 and who is not shedding SARS-CoV-2 virus would expect to have a negative (not detected) result in this assay.        Impression and  Recommendations:    1. Malignant neoplasm of overlapping sites of left lung ([place])   2. Chronic obstructive pulmonary disease, unspecified COPD type ([place])   3. Age-related osteoporosis without current pathological fracture   4. Vitamin D deficiency   5. Hypothyroidism, unspecified type   6. H/O cardiac catheterization- in prep for lung ca sx   7. Hyperlipidemia LDL goal <70     -Hyperlipidemia: Patient levels much improved on current regimen.  HDL remains above 60 and LDL less than 70.  - .  Encouraged smoking cessation but patient declined.  She is in pre-contemplative stages of quitting.  Information on smoking cessation passed on to patient  -COPD: Stable.  Denies any increased shortness of breath ; stable  -Vitamin D deficiency: Vitamin D actually went down from prior.  With her history of osteoporosis and chemotherapy etc. due to lung cancer, we will add once weekly vitamin D to her 5000 IU daily.  Recheck levels around 2 to 3 months  - As part of my medical decision making, I reviewed the following data within the [place] History obtained from pt /family, CMA notes reviewed and incorporated if applicable, Labs reviewed, Radiograph/ tests reviewed if applicable and OV notes from prior OV's with me, as well as other specialists she/he has seen since seeing me last, were all reviewed and used in my medical decision making process today.   - Additionally, discussion had with patient regarding txmnt  plan, their biases about that plan etc were used in my medical decision making today.   - The patient agreed with the plan and demonstrated an understanding of the instructions.   No barriers to understanding were identified.   - Red flag symptoms and signs discussed in detail.  Patient expressed understanding regarding what to do in case of emergency\ urgent symptoms.  The patient was advised to call back or seek an in-person evaluation if the symptoms worsen or if the condition  fails to improve as anticipated.   Return for 2-3 mo- reck vit D- started prescription; f/up 14moOV w me.    Meds ordered this encounter  Medications  . Vitamin D, Ergocalciferol, (DRISDOL) 1.25 MG (50000 UT) CAPS capsule    Sig: Take one tablet wkly    Dispense:  12 capsule    Refill:  3    Medications Discontinued During This Encounter  Medication Reason  . benzonatate (TESSALON) 100 MG capsule      I provided 35+ minutes of non-face-to-face time during this encounter,with over 50% of the time in direct counseling on patients medical conditions/ medical concerns.  Additional time was spent with charting and coordination of care after the actual visit commenced.   Note:  This note was prepared with assistance of Dragon voice recognition software. Occasional wrong-word or sound-a-like substitutions may have occurred due to the inherent limitations of voice recognition software.  Mellody Dance, DO 11/16/2018 10 AM   Patient Care Team    Relationship Specialty Notifications Start End  Mellody Dance, DO PCP - General Family Medicine  01/06/17   Dorothy Spark, MD Consulting Physician Cardiology  01/06/17   Tanda Rockers, MD Consulting Physician Pulmonary Disease  01/06/17   Hurley Cisco, MD Consulting Physician Rheumatology  01/06/17   Lerry Paterson, MD Referring Physician   01/06/17    Comment: lung ca Surgeon  Lissa Morales, MD Referring Physician Internal Medicine  01/06/17    Comment: Oncologist- Chemo doc  [name], MD Consulting Physician Infectious Diseases  03/17/18    Comment: + RPR test    -Vitals obtained; medications/ allergies reconciled;  personal medical, social, Sx etc.histories were updated by CMA, reviewed by me and are reflected in chart  Patient Active Problem List   Diagnosis Date Noted  . Hyperlipidemia LDL goal <70 03/12/2018    Priority: High  . Tobacco use disorder 01/22/2017    Priority: High  . Tobacco abuse counseling  01/22/2017    Priority: High  . Chronic obstructive pulmonary disease ([place])     Priority: High  . 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 Malignant neoplasm of left lung ([place]) 12/30/2014    Priority: High  . Malignant neoplasm of overlapping sites of left lung ([place]) 12/06/2014    Priority: High  . Scleroderma ([place]) 10/03/2014    Priority: High  . Age-related osteoporosis without current pathological fracture 02/20/2017    Priority: Medium  . Estrogen deficiency 01/14/2017    Priority: Medium  . H/O cardiac catheterization- in prep for lung ca sx 01/06/2017    Priority: Medium  . Hypothyroidism 01/06/2017    Priority: Medium  . 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 Chronic diastolic congestive heart failure ([place])     Priority: Medium  . Asthmatic bronchitis , chronic ([place]) 10/03/2014    Priority: Medium  . Current Cigarette smoker 10/03/2014    Priority: Medium  .  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 Neoplastic malignant related fatigue 01/22/2017    Priority: Low  . Vitamin D insufficiency 01/22/2017    Priority: Low  . Previous back surgeries 01/06/2017    Priority: Low  . Antineoplastic chemotherapy induced anemia 05/05/2015    Priority: Low  . Raynaud's disease 01/19/2015    Priority: Low  . Severe mitral regurgitation 11/15/2014    Priority: Low  . Multiple pulmonary nodules 10/03/2014    Priority: Low  . 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 On statin therapy 04/27/2018  . Memory loss or impairment 04/27/2018  . Complicated migraine 16/02/9603  . 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 Stroke-like episode ([place]) s/p tPA 03/11/2018  . Dysuria 10/14/2017  . Abnormal finding on urinalysis 10/14/2017  . Pharyngitis 10/14/2017   . Closed nondisplaced fracture of base of fifth metacarpal bone of right hand 05/27/2017  . Decreased range of motion of right ankle 05/27/2017  . Pain and swelling of right wrist 05/27/2017  . ASCUS of cervix with negative high risk HPV 01/29/2017  . History of epistaxis 03/30/2015  . Lung cancer ([place]) 12/30/2014  . Nonintractable episodic headache 12/06/2014     Current Meds  Medication Sig  . acetaminophen (TYLENOL) 500 MG tablet Take 1,000 mg by mouth every 6 (six) hours as needed for mild pain or headache.  Marland Kitchen atorvastatin (LIPITOR) 20 MG tablet Take 1 tablet (20 mg total) by mouth daily at 6 PM.  . azelastine (ASTELIN) 0.1 % nasal spray Place 2 sprays into both nostrils 2 (two) times daily. Use in each nostril as directed  . Cholecalciferol (VITAMIN D3) 5000 units TABS 5,000 IU OTC vitamin D3 daily.  . clopidogrel (PLAVIX) 75 MG tablet Take 1 tablet (75 mg total) by mouth daily.  . fluticasone (FLONASE) 50 MCG/ACT nasal spray 1 spray each nostril following sinus rinses twice daily  . Fluticasone-Umeclidin-Vilant (TRELEGY ELLIPTA) 100-62.5-25 MCG/INH AEPB Inhale 1 puff into the lungs daily.  . Multiple Vitamins-Minerals (MULTIVITAMIN WITH MINERALS) tablet Take 1 tablet by mouth daily.  . naproxen sodium (ALEVE) 220 MG tablet Take 220 mg by mouth as needed (pain).  . Turmeric 500 MG CAPS Take 500 mg by mouth daily.  . [DISCONTINUED] benzonatate (TESSALON) 100 MG capsule Take 1 capsule (100 mg total) by mouth 3 (three) times daily as needed.  . [DISCONTINUED] levothyroxine (SYNTHROID) 175 MCG tablet Take 1 tablet (175 mcg total) by mouth daily. Needs office visit for further refills     Allergies  Allergen Reactions  . Penicillins Hives    Unknown, occurred as a child Has patient had a PCN reaction causing immediate rash, facial/tongue/throat swelling, SOB or lightheadedness with hypotension: Yes Has patient had a PCN reaction causing severe rash involving mucus membranes or skin  necrosis: No Has patient had a PCN reaction that required hospitalization  Has patient had a PCN reaction occurring within the last 10 years: No If all of the above answers are "NO", then may proceed with Cephalosporin use. No    ROS:  See above HPI for pertinent positives and negatives  Objective:   Blood pressure 124/68, pulse 66, temperature 98 F (36.7 C), height 5' 6.5" (1.689 m), weight 131 lb (59.4 kg).  (if some vitals are omitted, this means that patient was UNABLE to obtain them even though  they were asked to get them prior to OV today.  They were asked to call us at their earliest convenience with these once obtained.)  General: A & O * 3; visually in no acute distress; in usual state of health.  Skin: Visible skin appears normal and pt's usual skin color HEENT:  EOMI, head is normocephalic and atraumatic.  Sclera are anicteric. Neck has a good range of motion.  Lips are noncyanotic Chest: normal chest excursion and movement Respiratory: speaking in full sentences, no conversational dyspnea; no use of accessory muscles Psych: insight good, mood- appears full

## 2018-11-17 NOTE — Telephone Encounter (Signed)
Pt is calling in wanting to know if she can have a telehealth appt instead of coming I office. Please advise

## 2018-11-17 NOTE — Telephone Encounter (Signed)
I called pt wanting to do video instead of in office visit. Pt stated she does not want to come out with the COVID 19 still occurring. She just wants to do a video. Pt gave verbal consent to do video and to file insurance. Pt is on mychart and has smart phone with camera. I explained pt the mychart process and to log on 10 minutes early to do video. I updated  pts meds, allergies and PCP with her today.

## 2018-11-18 ENCOUNTER — Encounter: Payer: Self-pay | Admitting: Adult Health

## 2018-11-18 ENCOUNTER — Telehealth (INDEPENDENT_AMBULATORY_CARE_PROVIDER_SITE_OTHER): Payer: Medicare Other | Admitting: Adult Health

## 2018-11-18 VITALS — BP 126/68 | HR 71

## 2018-11-18 DIAGNOSIS — I639 Cerebral infarction, unspecified: Secondary | ICD-10-CM | POA: Diagnosis not present

## 2018-11-18 DIAGNOSIS — E785 Hyperlipidemia, unspecified: Secondary | ICD-10-CM

## 2018-11-18 DIAGNOSIS — R2990 Unspecified symptoms and signs involving the nervous system: Secondary | ICD-10-CM

## 2018-11-18 NOTE — Progress Notes (Signed)
Joy Williams [address] [place]. [place] [address] [phone] [id number]       FOLLOW UP NOTE  [name] Date of Birth:  1950/12/04 Medical Record Number:  [id number]   Reason for Referral:  hospital stroke follow up  Virtual Visit via Video Note  I connected with Joy Williams on 11/18/18 at  9:45 AM EDT by a video enabled telemedicine application located at [facility] and verified that I am speaking with the correct person using two identifiers who was located at their own home.   Visit scheduled by Joy Fillers, RN who discussed the limitations of evaluation and management by telemedicine and the availability of in person appointments. The patient expressed understanding and agreed to proceed.    HPI: 11/18/18 VIRTUAL VISIT Initially scheduled today for office face-to-face follow-up visit regarding prior stroke like episode in 02/2018 but due to current pandemic, patient requested changing of face-to-face visit to telemedicine visit which was performed via [facility].  She has been doing well from a stroke standpoint without residual deficits Blood pressure today 126/68 - obtained at home prior to visit Continues on atorvastatin 20mg  recent lipid panel obtained by PCP on 6/24 showed LDL 64 Continue ton plavix without bleeding or bruising Has returned back to all prior activities without difficulties No concerns at this time     Initial visit 04/14/2018:  Joy Williams is being seen today for initial visit in the office for stroke like episode s/p TPA versus complicated migraine on 37/08/8887. History obtained from patient and chart review. Reviewed all radiology images and labs personally.  Joy Williams an 68 y.o.femalepast medical history of lung cancer status post resection and chemotherapy, mitral valve regurgitation status post mitral valve replacement, Raynaud's disease, telangiectasia, COPD, chronic diastolic heart failure  who was admitted with sudden onset vertigo, headache and gait imbalance after gettingoutfrom the shower.  CT had reviewed and was negative for acute infarct or hemorrhage.  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  Per notes, it was felt as though ICA proximal occlusion was likely due to artifact after images are further reviewed.  MRI brain reviewed and was negative for acute infarct.  Carotid Doppler showed bilateral ICA 1 to 39% stenosis with VAs antegrade.  2D echo showed an EF of 55 to 60% without cardiac source of embolus identified.  Patient was on aspirin 81 mg PTA and was recommended DAPT for 3 weeks and then Plavix alone but per notes, patient was hesitant on continuing Plavix long-term.  LDL 108 and recommended initiating statin for HLD management.  All symptoms resolved prior to discharge and was unable to be determined if episode related to TIA versus aborted stroke versus complicated migraine.  Patient was discharged home in stable condition.  Patient is being seen today for hospital follow-up.  She states she continues to have occasional headaches with dizziness that occurs approximately 2-3 times per week where upon standing she will feel a strong rash of fullness sensation in her head with a dizziness sensation that lasts approximately 1 minute.  She will drink water at that time and the sensation subsides.  She states this been occurring for approximately 10 years now.  The symptoms she was feeling when she was admitted to the hospital with the same type of symptoms but much stronger than what she  is used to.  As patient was called back by nurse from lobby to exam room, patient apparently had 1 of these episodes with loss of balance and headache sensation.  Orthostatics obtained initially with  lying down 123/63, sitting 101/62 and standing 99/58.  Obtained sitting and standing pressures after patient sitting in chair for a while during visit with sitting pressure 110/70 and standing pressure 90/58.  At the end of the visit, patient had symptoms subsided without continued dizziness, pressure sensation or headache.  She does states she continues on Plavix without side effects of bleeding or bruising as she has completed 3 weeks DAPT.  Continues on atorvastatin without side effects myalgias.  Patient has returned back to all prior activities.  Denies new or worsening stroke/TIA symptoms.   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 [facility] (had to have botox injection)   COPD (chronic obstructive pulmonary disease) ([place])    Family history of adverse reaction to anesthesia    Mother- very sensitive to medication   Heart murmur    MVP   Hypothyroidism    Lung cancer ([place]) 12/30/2014   Synchronous primaries:  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, each treated by wedge resection with post-op adjuvant chemoRx at [facility]   MVP (mitral valve prolapse)    PONV (postoperative nausea and vomiting)    Raynaud's disease    Raynaud's   Raynaud's syndrome    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   31 mm [facility] mitral bovine bioprosthetic tissue valve placed via right mini thoracotomy approach   Severe mitral regurgitation 11/15/2014   Shortness of breath dyspnea    with exertion   STD (sexually transmitted disease)    Telangiectasia    Thyroid disease     PSH:  Past Surgical History:  Procedure  Laterality Date   BACK SURGERY     x 3  Disectomy   BREAST BIOPSY Left    CARDIAC CATHETERIZATION N/A 03/08/2016   Procedure: Right/Left Heart Cath and Coronary Angiography;  Surgeon: Sherren Mocha, MD;  Location: [facility];  Service: Cardiovascular;  Laterality: N/A;   CLAVICLE SURGERY Left 2013   plate to left collar bone   LAPAROSCOPY     ? reason-age 53    LUNG CANCER SURGERY     MITRAL VALVE REPAIR Right 06/27/2016   Procedure: MINIMALLY INVASIVE MITRAL VALVE REPLACEMENT;  Surgeon: Rexene Alberts, MD;  Location: [facility];  Service: Open Heart Surgery;  Laterality: Right;   TEE WITHOUT CARDIOVERSION N/A 02/22/2016   Procedure: TRANSESOPHAGEAL ECHOCARDIOGRAM (TEE);  Surgeon: Dorothy Spark, MD;  Location: [facility];  Service: Cardiovascular;  Laterality: N/A;   TEE WITHOUT CARDIOVERSION N/A 06/27/2016   Procedure: TRANSESOPHAGEAL ECHOCARDIOGRAM (TEE);  Surgeon: Rexene Alberts, MD;  Location: [facility];  Service: Open Heart Surgery;  Laterality: N/A;   TONSILLECTOMY     VIDEO ASSISTED THORACOSCOPY (VATS)/WEDGE RESECTION Left 12/30/2014   Bronchoscopy, Mediastinoscopy, Left VATS for Wedge resection LUL x2 adn LLL x1 - Dr. Elenor Quinones at [place] Left 2017  injected with botox   wrist surgery Left 2015   plate to wrist     Social History:  Social History   Socioeconomic History   Marital status: Divorced    Spouse name: Not on file   Number of children: Not on file   Years of education: Not on file   Highest education level: Not on file  Occupational History   Not on file  Social Needs   Financial resource strain: Not on file   Food insecurity    Worry: Not on file    Inability: Not on file   Transportation needs    Medical: Not on file    Non-medical: Not on file  Tobacco Use   Smoking status: Current Some Day Smoker    Packs/day: 0.25    Years: 24.00    Pack years: 6.00    Types: E-cigarettes, Cigarettes   Smokeless  tobacco: Never Used  Substance and Sexual Activity   Alcohol use: Yes    Alcohol/week: 2.0 standard drinks    Types: 1 Shots of liquor, 1 Standard drinks or equivalent per week   Drug use: No   Sexual activity: Never    Partners: Male    Birth control/protection: Post-menopausal  Lifestyle   Physical activity    Days per week: Not on file    Minutes per session: Not on file   Stress: Not on file  Relationships   Social connections    Talks on phone: Not on file    Gets together: Not on file    Attends religious service: Not on file    Active member of club or organization: Not on file    Attends meetings of clubs or organizations: Not on file    Relationship status: Not on file   Intimate partner violence    Fear of current or ex partner: Not on file    Emotionally abused: Not on file    Physically abused: Not on file    Forced sexual activity: Not on file  Other Topics Concern   Not on file  Social History Narrative   Not on file    Family History:  Family History  Problem Relation Age of Onset   Mitral valve prolapse Mother    Dementia Father    Prostate cancer Father    Mitral valve prolapse Brother    Mitral valve prolapse Sister    Colon cancer Neg Hx    Colon polyps Neg Hx    Rectal cancer Neg Hx    Esophageal cancer Neg Hx    Stomach cancer Neg Hx    Heart attack Neg Hx     Medications:   Current Outpatient Medications on File Prior to Visit  Medication Sig Dispense Refill   acetaminophen (TYLENOL) 500 MG tablet Take 1,000 mg by mouth every 6 (six) hours as needed for mild pain or headache.     atorvastatin (LIPITOR) 20 MG tablet Take 1 tablet (20 mg total) by mouth daily at 6 PM. 90 tablet 1   azelastine (ASTELIN) 0.1 % nasal spray Place 2 sprays into both nostrils 2 (two) times daily. Use in each nostril as directed 30 mL 12   Cholecalciferol (VITAMIN D3) 5000 units TABS 5,000 IU OTC vitamin D3 daily. 90 tablet 3   clopidogrel  (PLAVIX) 75 MG tablet Take 1 tablet (75 mg total) by mouth daily. 90 tablet 1   fluticasone (FLONASE) 50 MCG/ACT nasal spray 1 spray each nostril following sinus rinses twice  daily 16 g 2   Fluticasone-Umeclidin-Vilant (TRELEGY ELLIPTA) 100-62.5-25 MCG/INH AEPB Inhale 1 puff into the lungs daily. 60 each 11   levothyroxine (SYNTHROID) 175 MCG tablet Take 1 tablet (175 mcg total) by mouth daily. Needs office visit for further refills 30 tablet 0   Multiple Vitamins-Minerals (MULTIVITAMIN WITH MINERALS) tablet Take 1 tablet by mouth daily.     naproxen sodium (ALEVE) 220 MG tablet Take 220 mg by mouth as needed (pain).     Turmeric 500 MG CAPS Take 500 mg by mouth daily.     Vitamin D, Ergocalciferol, (DRISDOL) 1.25 MG (50000 UT) CAPS capsule Take one tablet wkly 12 capsule 3   No current facility-administered medications on file prior to visit.     Allergies:   Allergies  Allergen Reactions   Penicillins Hives    Unknown, occurred as a child Has patient had a PCN reaction causing immediate rash, facial/tongue/throat swelling, SOB or lightheadedness with hypotension: Yes Has patient had a PCN reaction causing severe rash involving mucus membranes or skin necrosis: No Has patient had a PCN reaction that required hospitalization  Has patient had a PCN reaction occurring within the last 10 years: No If all of the above answers are "NO", then may proceed with Cephalosporin use. No     Physical Exam  Vitals:   11/18/18 0958  BP: 126/68  Pulse: 71   There is no height or weight on file to calculate BMI. No exam data present   General: well developed, well nourished, pleasant middle-age Caucasian female, seated, in no evident distress Head: head normocephalic and atraumatic.    Neurologic Exam Mental Status: Awake and fully alert. Oriented to place and time. Recent and remote memory intact. Attention span, concentration and fund of knowledge appropriate. Mood and affect  appropriate.  Cranial Nerves: Extraocular movements full without nystagmus. Hearing intact to voice. Facial sensation intact. Face, tongue, palate moves normally and symmetrically.  Shoulder shrug symmetric. Motor: No evidence of weakness per drift assessment Sensory.: intact to light touch Coordination: Rapid alternating movements normal in all extremities. Finger-to-nose and heel-to-shin performed accurately bilaterally. Gait and Station: Arises from chair without difficulty. Stance is normal. Gait demonstrates normal stride length and balance .  Reflexes: UTA       Diagnostic Data (Labs, Imaging, Testing)  CT HEAD WO CONTRAST 03/11/2018 IMPRESSION: 1. Small circumscribed hypoechoic focus in the left caudate head which may represent a late subacute or remote lacunar infarct. 2. Otherwise, no acute intracranial abnormality.  CT ANGIO HEAD W OR WO CONTRAST CT ANGIO NECK W OR WO CONTRAST 03/11/2018 IMPRESSION: Occlusive soft plaque/thrombus, proximal RIGHT ICA. No measurable lumen. Good opacification of the RIGHT ICA more distally, although unclear how antegrade flow is accomplished. No distal emboli in the RIGHT anterior intracranial circulation. Nonstenotic plaque LEFT carotid bifurcation.  MR BRAIN WO CONTRAST 03/12/2018 IMPRESSION: No acute intracranial finding. Mild chronic small-vessel ischemic change of the cerebellum and left thalamus.  ECHOCARDIOGRAM 03/12/2018 Impressions: - No cardiac source of emboli was indentified. Normal LV EF, grade   2 diastolic dysfunction. Bioprosthetic mitral valve appears in   good posiion with a trivial gradient.    ASSESSMENT: Joy Williams is a 68 y.o. year old female here with strokelike episode s/p TPA versus complicated migraine on 09/60/4540. Vascular risk factors include lung cancer s/p resection and chemotherapy, mitral valve regurgitation s/p mitral valve replacement, Raynard's disease, COPD, chronic diastolic heart  failure and HLD.  Stable from stroke standpoint without residual deficits or reoccurring  of symptoms    PLAN:  1. Strokelike episode s/p TPA: Continue clopidogrel 75 mg daily  and atorvastatin for secondary stroke prevention. Maintain strict control of hypertension with blood pressure goal below 130/90, diabetes with hemoglobin A1c goal below 6.5% and cholesterol with LDL cholesterol (bad cholesterol) goal below 70 mg/dL.  I also advised the patient to eat a healthy diet with plenty of whole grains, cereals, fruits and vegetables, exercise regularly with at least 30 minutes of continuous activity daily and maintain ideal body weight. 2. HLD: Advised to continue current treatment regimen along with continued follow-up with PCP for future prescribing and monitoring of lipid panel; recent lipid panel 11/11/2018 showed LDL 64   Stable from stroke standpoint - follow up as needed   Greater than 50% of time during this 15 minute non-face-to-face visit was spent on counseling, explanation of diagnosis of strokelike episode, reviewing risk factor management of HLD, planning of further management along with potential future management, and discussion with patient and family answering all questions.    Venancio Poisson, AGNP-BC  [facility] [address] [place], [place] [address]  Phone [phone] Fax [phone] Note: This document was prepared with digital dictation and possible smart phrase technology. Any transcriptional errors that result from this process are unintentional.

## 2018-11-18 NOTE — Progress Notes (Signed)
I agree with the above plan 

## 2018-11-19 ENCOUNTER — Telehealth: Payer: Self-pay | Admitting: General Practice

## 2018-11-19 ENCOUNTER — Other Ambulatory Visit: Payer: Medicare Other

## 2018-11-19 ENCOUNTER — Telehealth: Payer: Self-pay | Admitting: Family Medicine

## 2018-11-19 ENCOUNTER — Encounter: Payer: Self-pay | Admitting: Internal Medicine

## 2018-11-19 DIAGNOSIS — R6889 Other general symptoms and signs: Secondary | ICD-10-CM | POA: Diagnosis not present

## 2018-11-19 DIAGNOSIS — Z20822 Contact with and (suspected) exposure to covid-19: Secondary | ICD-10-CM

## 2018-11-19 NOTE — Telephone Encounter (Signed)
Patient called states she is still having an elevated temp (99.6 several days / Today it is 99.3)she continues to feel  Nausea, body aches, fatigue & Wishes to be COVID 19 tested & or a Rx called in.   --Forwarding message to medical assistant to call pt @ [phone].  --glh

## 2018-11-19 NOTE — Telephone Encounter (Signed)
Pt has been scheduled for covid testing.  ° °

## 2018-11-19 NOTE — Telephone Encounter (Signed)
Pt has been scheduled for covid testing.  Scheduled apt with pt directly. Pt was referred by: Mellody Dance, DO

## 2018-11-19 NOTE — Telephone Encounter (Signed)
Pt states that she is also breathing "shallow".  Pt informed that we will refer for COVID testing.  Charyl Bigger, CMA

## 2018-11-25 ENCOUNTER — Ambulatory Visit: Payer: Medicare Other | Admitting: Internal Medicine

## 2018-11-25 LAB — NOVEL CORONAVIRUS, NAA: SARS-CoV-2, NAA: NOT DETECTED

## 2018-11-29 ENCOUNTER — Other Ambulatory Visit: Payer: Self-pay | Admitting: Family Medicine

## 2018-11-30 ENCOUNTER — Telehealth: Payer: Self-pay | Admitting: Family Medicine

## 2018-11-30 ENCOUNTER — Other Ambulatory Visit: Payer: Self-pay | Admitting: Family Medicine

## 2018-11-30 DIAGNOSIS — R053 Chronic cough: Secondary | ICD-10-CM

## 2018-11-30 DIAGNOSIS — R05 Cough: Secondary | ICD-10-CM

## 2018-11-30 NOTE — Telephone Encounter (Signed)
Patient called to request refill on this Rx :  benzonatate (TESSALON) 100 MG capsule [[id number]] DISCONTINUED  Order Details Dose: 100 mg Route: Oral Frequency: 3 times daily PRN  Dispense Quantity: 30 capsule Refills: 0 Fills remaining: --        Sig: Take 1 capsule (100 mg total) by mouth 3 (three) times daily as needed.     ---Forwarding request to medical assistant that if approved send refill order to :  [id number] - [place], [place]. [phone] (Phone) [phone] (Fax)   --advised pt that we ask 24 to 48 hrs for Rx refills request & medical assistant will call her if there are any questions.  --glh

## 2018-12-01 ENCOUNTER — Ambulatory Visit: Payer: Medicare Other | Admitting: Internal Medicine

## 2018-12-01 NOTE — Telephone Encounter (Signed)
LOV 11/05/2018.  Spoke to patient she states that she is still taking for dry cough once a day.  Patient states that she is using the nasal spray every day.  Patient was to follow up in 3-4 weeks for the cough.  Patient would prefer not come into the office at this time due to [place].  Please review and advise if patient needs a follow up and if it can be via telemedicine or if this will be best in office. MPulliam, CMA/RT(R)

## 2018-12-02 NOTE — Telephone Encounter (Signed)
We cannot continue the bends and 8 tablets.   If she still having a continuous dry cough then please order Astelin nasal spray for her to use twice daily.  Use this in addition to Flonase.  Also both should be done after sinus rinses.  This is very important.  Also please call her in Singulair-10 mg 1 p.o. nightly.    -If after 3 to 4 weeks of that treatment if she is still having symptoms, I will need to see her in the office to listen to her.  She cannot have Tessalon [place] prescription at this time

## 2018-12-02 NOTE — Telephone Encounter (Signed)
Spoke to patient and notified patient would like to research the Singulair and will let me know if she would like to start this medication. MPulliam, CMA/RT(R)

## 2019-01-06 ENCOUNTER — Other Ambulatory Visit: Payer: Self-pay | Admitting: Family Medicine

## 2019-01-28 ENCOUNTER — Other Ambulatory Visit: Payer: Self-pay | Admitting: Family Medicine

## 2019-01-28 DIAGNOSIS — J310 Chronic rhinitis: Secondary | ICD-10-CM

## 2019-01-28 DIAGNOSIS — R067 Sneezing: Secondary | ICD-10-CM

## 2019-02-01 ENCOUNTER — Ambulatory Visit: Payer: Medicare Other | Admitting: Family Medicine

## 2019-02-18 ENCOUNTER — Ambulatory Visit: Payer: Medicare Other

## 2019-02-18 ENCOUNTER — Ambulatory Visit (INDEPENDENT_AMBULATORY_CARE_PROVIDER_SITE_OTHER): Payer: Medicare Other

## 2019-02-18 ENCOUNTER — Telehealth: Payer: Self-pay

## 2019-02-18 ENCOUNTER — Other Ambulatory Visit: Payer: Self-pay

## 2019-02-18 DIAGNOSIS — Z23 Encounter for immunization: Secondary | ICD-10-CM | POA: Diagnosis not present

## 2019-02-18 NOTE — Telephone Encounter (Signed)
Patient was questioning what vaccines were needed.  After review of 04/2018 visit patient was to have 2 shingrix and pneumovax 23.  [facility] is going to call [facility] and find out if she has had a second shingrix or pneumovax.  Once we get the information we can reevaluate immunizations.  Patient agreeable.

## 2019-02-24 ENCOUNTER — Other Ambulatory Visit: Payer: Self-pay

## 2019-02-24 ENCOUNTER — Emergency Department (HOSPITAL_COMMUNITY): Payer: Medicare Other

## 2019-02-24 ENCOUNTER — Encounter (HOSPITAL_COMMUNITY): Payer: Self-pay | Admitting: Emergency Medicine

## 2019-02-24 ENCOUNTER — Observation Stay (HOSPITAL_COMMUNITY)
Admission: EM | Admit: 2019-02-24 | Discharge: 2019-02-25 | Disposition: A | Discharge status: Home / Self Care | Payer: Medicare Other | Attending: Internal Medicine | Admitting: Internal Medicine

## 2019-02-24 DIAGNOSIS — F17210 Nicotine dependence, cigarettes, uncomplicated: Secondary | ICD-10-CM | POA: Diagnosis not present

## 2019-02-24 DIAGNOSIS — I7300 Raynaud's syndrome without gangrene: Secondary | ICD-10-CM | POA: Diagnosis present

## 2019-02-24 DIAGNOSIS — R079 Chest pain, unspecified: Secondary | ICD-10-CM | POA: Diagnosis not present

## 2019-02-24 DIAGNOSIS — Z880 Allergy status to penicillin: Secondary | ICD-10-CM | POA: Diagnosis not present

## 2019-02-24 DIAGNOSIS — R0789 Other chest pain: Secondary | ICD-10-CM | POA: Diagnosis not present

## 2019-02-24 DIAGNOSIS — I959 Hypotension, unspecified: Secondary | ICD-10-CM | POA: Diagnosis not present

## 2019-02-24 DIAGNOSIS — Z85118 Personal history of other malignant neoplasm of bronchus and lung: Secondary | ICD-10-CM | POA: Insufficient documentation

## 2019-02-24 DIAGNOSIS — Z79890 Hormone replacement therapy (postmenopausal): Secondary | ICD-10-CM | POA: Insufficient documentation

## 2019-02-24 DIAGNOSIS — Z20828 Contact with and (suspected) exposure to other viral communicable diseases: Secondary | ICD-10-CM | POA: Diagnosis not present

## 2019-02-24 DIAGNOSIS — R7989 Other specified abnormal findings of blood chemistry: Secondary | ICD-10-CM

## 2019-02-24 DIAGNOSIS — J449 Chronic obstructive pulmonary disease, unspecified: Secondary | ICD-10-CM | POA: Diagnosis present

## 2019-02-24 DIAGNOSIS — Z952 Presence of prosthetic heart valve: Secondary | ICD-10-CM | POA: Insufficient documentation

## 2019-02-24 DIAGNOSIS — E039 Hypothyroidism, unspecified: Secondary | ICD-10-CM | POA: Diagnosis present

## 2019-02-24 DIAGNOSIS — M349 Systemic sclerosis, unspecified: Secondary | ICD-10-CM | POA: Diagnosis not present

## 2019-02-24 DIAGNOSIS — I5032 Chronic diastolic (congestive) heart failure: Secondary | ICD-10-CM | POA: Diagnosis not present

## 2019-02-24 DIAGNOSIS — Z953 Presence of xenogenic heart valve: Secondary | ICD-10-CM

## 2019-02-24 DIAGNOSIS — I214 Non-ST elevation (NSTEMI) myocardial infarction: Secondary | ICD-10-CM | POA: Diagnosis not present

## 2019-02-24 DIAGNOSIS — Z87891 Personal history of nicotine dependence: Secondary | ICD-10-CM | POA: Diagnosis present

## 2019-02-24 DIAGNOSIS — R778 Other specified abnormalities of plasma proteins: Secondary | ICD-10-CM

## 2019-02-24 DIAGNOSIS — R2990 Unspecified symptoms and signs involving the nervous system: Secondary | ICD-10-CM | POA: Diagnosis present

## 2019-02-24 DIAGNOSIS — I491 Atrial premature depolarization: Secondary | ICD-10-CM | POA: Diagnosis not present

## 2019-02-24 DIAGNOSIS — Z7902 Long term (current) use of antithrombotics/antiplatelets: Secondary | ICD-10-CM | POA: Insufficient documentation

## 2019-02-24 DIAGNOSIS — Z79899 Other long term (current) drug therapy: Secondary | ICD-10-CM | POA: Insufficient documentation

## 2019-02-24 DIAGNOSIS — R0602 Shortness of breath: Secondary | ICD-10-CM | POA: Diagnosis not present

## 2019-02-24 DIAGNOSIS — Z8673 Personal history of transient ischemic attack (TIA), and cerebral infarction without residual deficits: Secondary | ICD-10-CM | POA: Insufficient documentation

## 2019-02-24 LAB — CBC WITH DIFFERENTIAL/PLATELET
Abs Immature Granulocytes: 0.04 10*3/uL (ref 0.00–0.07)
Basophils Absolute: 0 10*3/uL (ref 0.0–0.1)
Basophils Relative: 0 %
Eosinophils Absolute: 0.1 10*3/uL (ref 0.0–0.5)
Eosinophils Relative: 1 %
HCT: 41.2 % (ref 36.0–46.0)
Hemoglobin: 13.3 g/dL (ref 12.0–15.0)
Immature Granulocytes: 1 %
Lymphocytes Relative: 23 %
Lymphs Abs: 1.6 10*3/uL (ref 0.7–4.0)
MCH: 29.1 pg (ref 26.0–34.0)
MCHC: 32.3 g/dL (ref 30.0–36.0)
MCV: 90.2 fL (ref 80.0–100.0)
Monocytes Absolute: 0.5 10*3/uL (ref 0.1–1.0)
Monocytes Relative: 7 %
Neutro Abs: 4.8 10*3/uL (ref 1.7–7.7)
Neutrophils Relative %: 68 %
Platelets: 204 10*3/uL (ref 150–400)
RBC: 4.57 MIL/uL (ref 3.87–5.11)
RDW: 12.2 % (ref 11.5–15.5)
WBC: 7.1 10*3/uL (ref 4.0–10.5)
nRBC: 0 % (ref 0.0–0.2)

## 2019-02-24 LAB — COMPREHENSIVE METABOLIC PANEL
ALT: 31 U/L (ref 0–44)
AST: 47 U/L — ABNORMAL HIGH (ref 15–41)
Albumin: 4.3 g/dL (ref 3.5–5.0)
Alkaline Phosphatase: 101 U/L (ref 38–126)
Anion gap: 11 (ref 5–15)
BUN: 10 mg/dL (ref 8–23)
CO2: 24 mmol/L (ref 22–32)
Calcium: 9.5 mg/dL (ref 8.9–10.3)
Chloride: 98 mmol/L (ref 98–111)
Creatinine, Ser: 0.69 mg/dL (ref 0.44–1.00)
GFR calc Af Amer: >60 mL/min (ref >60)
GFR calc non Af Amer: >60 mL/min (ref >60)
Glucose, Bld: 85 mg/dL (ref 70–99)
Potassium: 4.3 mmol/L (ref 3.5–5.1)
Sodium: 133 mmol/L — ABNORMAL LOW (ref 135–145)
Total Bilirubin: 0.9 mg/dL (ref 0.3–1.2)
Total Protein: 7.5 g/dL (ref 6.5–8.1)

## 2019-02-24 LAB — D-DIMER, QUANTITATIVE: D-Dimer, Quant: 1.52 ug/mL-FEU — ABNORMAL HIGH (ref 0.00–0.50)

## 2019-02-24 LAB — TROPONIN I (HIGH SENSITIVITY): Troponin I (High Sensitivity): 1471 ng/L (ref <18)

## 2019-02-24 MED ORDER — HEPARIN BOLUS VIA INFUSION
3500.0000 [IU] | Freq: Once | INTRAVENOUS | Status: AC
  Administered 2019-02-24: 3500 [IU] via INTRAVENOUS
  Filled 2019-02-24: qty 3500

## 2019-02-24 MED ORDER — NITROGLYCERIN 0.4 MG SL SUBL
0.4000 mg | SUBLINGUAL_TABLET | SUBLINGUAL | Status: AC | PRN
  Administered 2019-02-24 – 2019-02-25 (×3): 0.4 mg via SUBLINGUAL
  Filled 2019-02-24 (×2): qty 1

## 2019-02-24 MED ORDER — HEPARIN (PORCINE) 25000 UT/250ML-% IV SOLN
800.0000 [IU]/h | INTRAVENOUS | Status: DC
  Administered 2019-02-24: 700 [IU]/h via INTRAVENOUS
  Filled 2019-02-24: qty 250

## 2019-02-24 MED ORDER — ACETAMINOPHEN 500 MG PO TABS
1000.0000 mg | ORAL_TABLET | Freq: Once | ORAL | Status: AC
  Administered 2019-02-25: 1000 mg via ORAL
  Filled 2019-02-24: qty 2

## 2019-02-24 NOTE — ED Provider Notes (Addendum)
11:45 PM  Assumed care from Dr. Rex Kras.  Patient is a 68 year old female with history of mitral valve replacement, diastolic heart failure, COPD, lung cancer in remission who presents to the emergency department with chest pain that started this morning.  Felt more SOB today.  First troponin elevated at 1471.  Getting nitroglycerin and heparin.  Dr. Kalman Shan of cardiology consulted and they will see patient in consultation and would like medicine admission.  D-dimer elevated.  CTA pending.  12:45 AM  Pt chest pain-free after 3 nitroglycerin tablets.  CT shows no pulmonary embolus.  Patient still feeling some pressure in her chest at this time.  Will start IV nitroglycerin.  She is getting IV heparin.  States she chewed 4 aspirin at home prior to arrival.  Will discuss with hospitalist for admission per cardiology recommendation.  Will draw second troponin.  2:55 AM Discussed patient's case with hospitalist, Dr. Hal Hope.  I have recommended admission and patient (and family if present) agree with this plan. Admitting physician will place admission orders.   I reviewed all nursing notes, vitals, pertinent previous records, EKGs, lab and urine results, imaging (as available).  CRITICAL CARE Performed by: Pryor Curia   Total critical care time: 35 minutes  Critical care time was exclusive of separately billable procedures and treating other patients.  Critical care was necessary to treat or prevent imminent or life-threatening deterioration.  Critical care was time spent personally by me on the following activities: development of treatment plan with patient and/or surrogate as well as nursing, discussions with consultants, evaluation of patient's response to treatment, examination of patient, obtaining history from patient or surrogate, ordering and performing treatments and interventions, ordering and review of laboratory studies, ordering and review of radiographic studies, pulse oximetry and  re-evaluation of patient's condition.    , Delice Bison, DO 02/25/19 0256    EKG Interpretation  Date/Time:  Thursday February 25 2019 03:13:17 EDT Ventricular Rate:  67 PR Interval:    QRS Duration: 114 QT Interval:  463 QTC Calculation: 489 R Axis:   -29 Text Interpretation:  Sinus rhythm Left ventricular hypertrophy Anterior ST elevation, probably due to LVH Borderline prolonged QT interval No significant change since last tracing Confirmed by Pryor Curia [phone]) on 02/25/2019 3:22:04 AM         , Delice Bison, DO 02/25/19 0327

## 2019-02-24 NOTE — ED Provider Notes (Signed)
[facility] EMERGENCY DEPARTMENT Provider Note   CSN: [id number] Arrival date & time: 02/24/19  2049     History   Chief Complaint Chief Complaint  Patient presents with   Chest Pain    HPI Joy Williams is a 68 y.o. female.     68yo F w/ PMH including mitral valve replacement, dCHF, COPD, lung cancer in remission who p/w chest pain. This morning around 6am, she was awakened from sleep with central, nonradiating chest pain that she describes as a heaviness or tightness.  Pain has been relatively constant throughout the day, is not associated with exertion, and does not change with position.  Some increase in pain with deep inspiration. She states she has had some mild windedness today but also notes that she has been doing more activities than usual. No N/V, fever, change in chronic cough, leg swelling/pain, recent travel, or sick contacts. No h/o clots. Was given aspirin by EMS.   The history is provided by the patient.  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 [facility] (had to have botox injection)   COPD (chronic obstructive pulmonary disease) ([place])    Family history of adverse reaction to anesthesia    Mother- very sensitive to medication   Heart murmur    MVP   Hypothyroidism    Lung cancer ([place]) 12/30/2014   Synchronous primaries:  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, each treated by wedge resection with post-op adjuvant chemoRx at [facility]   MVP (mitral valve prolapse)    PONV (postoperative nausea and vomiting)    Raynaud's disease    Raynaud's   Raynaud's syndrome    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   31 mm [facility] mitral bovine bioprosthetic tissue  valve placed via right mini thoracotomy approach   Severe mitral regurgitation 11/15/2014   Shortness of breath dyspnea    with exertion   STD (sexually transmitted disease)    Telangiectasia    Thyroid disease     Patient Active Problem List   Diagnosis Date Noted   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   On statin therapy 04/27/2018   Memory loss or impairment 34/19/6222   Complicated migraine 97/98/9211   Hyperlipidemia LDL goal <70 03/12/2018   History of RPR test, 9/18 felt to be false positive 03/12/2018   Stroke-like episode ([place]) s/p tPA 03/11/2018   Dysuria 10/14/2017   Abnormal finding on urinalysis 10/14/2017   Pharyngitis 10/14/2017   Closed nondisplaced fracture of base of fifth metacarpal bone of right hand 05/27/2017   Decreased range of motion of right ankle 05/27/2017   Pain and swelling of right wrist 05/27/2017   Age-related osteoporosis without current pathological fracture 02/20/2017   ASCUS of cervix with negative high risk HPV 01/29/2017   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   Tobacco use disorder 01/22/2017   Tobacco abuse counseling 01/22/2017   Neoplastic malignant related fatigue 01/22/2017   Vitamin D insufficiency 01/22/2017   Estrogen deficiency 01/14/2017   H/O cardiac catheterization- in prep for lung ca sx 01/06/2017   Hypothyroidism 01/06/2017   Previous  back surgeries 01/06/2017   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   Chronic diastolic congestive heart failure (HCC)    Chronic obstructive pulmonary disease (HCC)    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   Antineoplastic chemotherapy induced anemia 05/05/2015   History of epistaxis 03/30/2015   Raynaud's disease 01/19/2015   Malignant neoplasm of left lung ([place]) 12/30/2014   Lung cancer ([place]) 12/30/2014   Malignant neoplasm  of overlapping sites of left lung ([place]) 12/06/2014   Nonintractable episodic headache 12/06/2014   Severe mitral regurgitation 11/15/2014   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 reason-age 89    LUNG CANCER SURGERY     MITRAL VALVE REPAIR Right 06/27/2016   Procedure: MINIMALLY INVASIVE MITRAL VALVE REPLACEMENT;  Surgeon: Rexene Alberts, MD;  Location: [place];  Service: Open Heart Surgery;  Laterality: Right;   TEE WITHOUT CARDIOVERSION N/A 02/22/2016   Procedure: TRANSESOPHAGEAL ECHOCARDIOGRAM (TEE);  Surgeon: Dorothy Spark, MD;  Location: [place];  Service: Cardiovascular;  Laterality: N/A;   TEE WITHOUT CARDIOVERSION N/A 06/27/2016   Procedure: TRANSESOPHAGEAL ECHOCARDIOGRAM (TEE);  Surgeon: Rexene Alberts, MD;  Location: [place];  Service: Open Heart Surgery;  Laterality: N/A;   TONSILLECTOMY     VIDEO ASSISTED THORACOSCOPY (VATS)/WEDGE RESECTION Left 12/30/2014   Bronchoscopy, Mediastinoscopy, Left VATS for Wedge resection LUL x2 adn LLL x1 - Dr. Elenor Quinones at [place] Left 2017   injected with botox   wrist surgery Left 2015   plate to wrist      OB History    Gravida  3   Para  2   Term  2   Preterm      AB  1   Living  2     SAB      TAB      Ectopic      Multiple      Live Births  2            Home Medications    Prior to Admission medications   Medication Sig Start Date End Date Taking? Authorizing  Provider  acetaminophen (TYLENOL) 500 MG tablet Take 1,000 mg by mouth every 6 (six) hours as needed for mild pain or headache.    [provider]  atorvastatin (LIPITOR) 20 MG tablet Take 1 tablet (20 mg total) by mouth daily at 6 PM. 10/22/18   Dorothy Spark, MD  azelastine (ASTELIN) 0.1 % nasal spray Place 2 sprays into both nostrils 2 (two) times daily. Use in each nostril as directed 11/05/18   Mellody Dance, DO  Cholecalciferol (VITAMIN D3) 5000 units TABS 5,000 IU OTC vitamin D3 daily. 01/22/17   Mellody Dance, DO  clopidogrel (PLAVIX) 75 MG tablet Take 1 tablet (75 mg total) by mouth daily. 09/15/18   Frann Rider, NP  fluticasone (FLONASE) 50 MCG/ACT nasal spray 1 SPRAY EACH NOSTRIL FOLLOWING SINUS RINSES  TWICE DAILY 01/28/19   Opalski, Neoma Laming, DO  Fluticasone-Umeclidin-Vilant (TRELEGY ELLIPTA) 100-62.5-25 MCG/INH AEPB Inhale 1 puff into the lungs daily. 07/09/18   Tanda Rockers, MD  levothyroxine (SYNTHROID) 175 MCG tablet Take 1 tablet (175 mcg total) by mouth daily before breakfast. 01/06/19   Opalski, Neoma Laming, DO  Multiple Vitamins-Minerals (MULTIVITAMIN WITH MINERALS) tablet Take 1 tablet by mouth daily.    [provider]  naproxen sodium (ALEVE) 220 MG tablet Take 220 mg by mouth as needed (pain).    [provider]  Turmeric 500 MG CAPS Take 500 mg by mouth daily.    [provider]  Vitamin D, Ergocalciferol, (DRISDOL) 1.25 MG (50000 UT) CAPS capsule Take one tablet wkly 11/16/18   Mellody Dance, DO    Family History Family History  Problem Relation Age of Onset   Mitral valve prolapse Mother    Dementia Father    Prostate cancer Father    Mitral valve prolapse Brother    Mitral valve prolapse Sister    Colon cancer Neg Hx    Colon polyps Neg Hx    Rectal cancer Neg Hx    Esophageal cancer Neg Hx    Stomach cancer Neg Hx    Heart attack Neg Hx     Social History Social History   Tobacco Use   Smoking status:  Current Every Day Smoker    Packs/day: 0.25    Years: 24.00    Pack years: 6.00    Types: E-cigarettes, Cigarettes   Smokeless tobacco: Never Used  Substance Use Topics   Alcohol use: Yes    Alcohol/week: 2.0 standard drinks    Types: 1 Shots of liquor, 1 Standard drinks or equivalent per week   Drug use: No     Allergies   Penicillins   Review of Systems Review of Systems  Cardiovascular: Positive for chest pain.   All other systems reviewed and are negative except that which was mentioned in HPI   Physical Exam Updated Vital Signs BP 126/62    Pulse 64    Temp 97.9 F (36.6 C)    Resp 17    Ht 5\' 8"  (1.727 m)    Wt 59.4 kg    SpO2 98%    BMI 19.91 kg/m   Physical Exam Vitals signs and nursing note reviewed.  Constitutional:      General: She is not in acute distress.    Appearance: She is well-developed.  HENT:     Head: Normocephalic and atraumatic.  Eyes:     Conjunctiva/sclera: Conjunctivae normal.     Pupils: Pupils are equal, round, and reactive to light.  Neck:     Musculoskeletal: Neck supple.  Cardiovascular:     Rate and Rhythm: Normal rate and regular rhythm.     Heart sounds: Normal heart sounds. No murmur.  Pulmonary:     Effort: Pulmonary effort is normal.     Breath sounds: Normal breath sounds.  Abdominal:     General: Bowel sounds are normal. There is no distension.     Palpations: Abdomen is soft.     Tenderness: There is no abdominal tenderness.  Musculoskeletal:     Right lower leg: No edema.     Left lower leg: No edema.  Skin:    General: Skin is warm and dry.  Neurological:     Mental Status: She is alert and oriented to person, place, and time.     Comments: Fluent speech  Psychiatric:  Judgment: Judgment normal.      ED Treatments / Results  Labs (all labs ordered are listed, but only abnormal results are displayed) Labs Reviewed  COMPREHENSIVE METABOLIC PANEL - Abnormal; Notable for the following components:       Result Value   Sodium 133 (*)    AST 47 (*)    All other components within normal limits  D-DIMER, QUANTITATIVE (NOT AT [facility]) - Abnormal; Notable for the following components:   D-Dimer, Quant 1.52 (*)    All other components within normal limits  TROPONIN I (HIGH SENSITIVITY) - Abnormal; Notable for the following components:   Troponin I (High Sensitivity) 1,471 (*)    All other components within normal limits  CBC WITH DIFFERENTIAL/PLATELET  HEPARIN LEVEL (UNFRACTIONATED)  CBC  APTT  PROTIME-INR  TROPONIN I (HIGH SENSITIVITY)    EKG EKG Interpretation  Date/Time:  Wednesday February 24 2019 20:53:36 EDT Ventricular Rate:  73 PR Interval:    QRS Duration: 115 QT Interval:  421 QTC Calculation: 464 R Axis:   -28 Text Interpretation:  Sinus rhythm Consider left atrial enlargement Left ventricular hypertrophy ST elevation suggests acute pericarditis No significant change since last tracing Confirmed by Theotis Burrow [phone]) on 02/24/2019 9:03:05 PM   Radiology Dg Chest 2 View  Result Date: 02/24/2019 CLINICAL DATA:  Chest pressure and pain EXAM: CHEST - 2 VIEW COMPARISON:  05/25/2017 FINDINGS: Cardiac shadows within normal limits. Postsurgical changes are seen. The lungs are well aerated bilaterally. Chronic fibrotic changes are noted in the left base. No sizable infiltrate or effusion is seen. No bony abnormality is noted. Prior fixation of the left clavicle is noted. IMPRESSION: Chronic changes without acute abnormality. Electronically Signed   By: Inez Catalina M.D.   On: 02/24/2019 21:40    Procedures .Critical Care Performed by: Sharlett Iles, MD Authorized by: Sharlett Iles, MD   Critical care provider statement:    Critical care time (minutes):  30   Critical care time was exclusive of:  Separately billable procedures and treating other patients   Critical care was necessary to treat or prevent imminent or life-threatening deterioration of the  following conditions:  Cardiac failure   Critical care was time spent personally by me on the following activities:  Development of treatment plan with patient or surrogate, discussions with consultants, examination of patient, obtaining history from patient or surrogate, ordering and performing treatments and interventions, ordering and review of laboratory studies, ordering and review of radiographic studies, re-evaluation of patient's condition and review of old charts   (including critical care time)  Medications Ordered in ED Medications  nitroGLYCERIN (NITROSTAT) SL tablet 0.4 mg (has no administration in time range)  heparin ADULT infusion 100 units/mL (25000 units/234mL sodium chloride 0.45%) (has no administration in time range)  heparin bolus via infusion 3,500 Units (has no administration in time range)     Initial Impression / Assessment and Plan / ED Course  I have reviewed the triage vital signs and the nursing notes.  Pertinent labs & imaging results that were available during my care of the patient were reviewed by me and considered in my medical decision making (see chart for details).       Well appearing and comfortable on exam, hypertensive. Given ASA by EMS. EKG unchanged from previous. DDx includes ACS, PE given history of cancer; less likely aortic dissection as pt denies sudden ripping or tearing CP and CXR clear.  Labs show D-dimer 1.52, trop 1471.  I  have ordered CTA of chest.  I am concerned about NSTEMI, therefore ordered heparin drip in addition to NTG. Discussed w/ cardiologist, Jonne Ply, who will see pt in consultation when CTA complete. Pt signed out to oncoming provider pending CTA.   Final Clinical Impressions(s) / ED Diagnoses   Final diagnoses:  None    ED Discharge Orders    None       Denver Harder, Wenda Overland, MD 02/24/19 2345

## 2019-02-24 NOTE — Progress Notes (Signed)
ANTICOAGULATION CONSULT NOTE - Initial Consult  Pharmacy Consult for heparin Indication: chest pain/ACS  Allergies  Allergen Reactions  . Penicillins Hives    Unknown, occurred as a child Has patient had a PCN reaction causing immediate rash, facial/tongue/throat swelling, SOB or lightheadedness with hypotension: Yes Has patient had a PCN reaction causing severe rash involving mucus membranes or skin necrosis: No Has patient had a PCN reaction that required hospitalization  Has patient had a PCN reaction occurring within the last 10 years: No If all of the above answers are "NO", then may proceed with Cephalosporin use. No    Patient Measurements: Height: 5\' 8"  (172.7 cm) Weight: 130 lb 15.3 oz (59.4 kg) IBW/kg (Calculated) : 63.9  Vital Signs: Temp: 97.9 F (36.6 C) (10/07 2052) BP: 126/62 (10/07 2245) Pulse Rate: 64 (10/07 2245)  Labs: Recent Labs    02/24/19 2212  HGB 13.3  HCT 41.2  PLT 204  CREATININE 0.69  TROPONINIHS 1,471*    Estimated Creatinine Clearance: 63.1 mL/min (by C-G formula based on SCr of 0.69 mg/dL).  Assessment: CC/HPI: 68 yo f presenting with sudden chest pressure  PMH: MVR, dCHF, COPD, lung cancer (remission)  Anticoag: none pta - iv hep for r/o ACS.   CV: trop 1471   Renal: SCr 0.69  Heme/Onc: H&H 13.3/41.2, Plt 204  Goal of Therapy:  Heparin level 0.3-0.7 units/ml Monitor platelets by anticoagulation protocol: Yes   Plan:  Heparin bolus 3500 units x 1 Heparin gtt 700 units/hr Initial HL 0600 Daily HL CBC F/U cards plans  Levester Fresh, PharmD, BCPS, BCCCP Clinical Pharmacist [phone]  Please check AMION for all [place] numbers  02/24/2019 11:36 PM

## 2019-02-24 NOTE — ED Notes (Signed)
Patient asymptomatic at this time. Requesting for Covid test, relayed to Dr. Rex Kras.

## 2019-02-24 NOTE — ED Triage Notes (Signed)
BIB [facility] from home with c/o of sudden chest pressure starting @ 0600. Pt's PCP recommended a visit to ED. Hx of valve replacement, taking Plavis for TIA last year.

## 2019-02-25 ENCOUNTER — Emergency Department (HOSPITAL_COMMUNITY): Payer: Medicare Other

## 2019-02-25 ENCOUNTER — Encounter (HOSPITAL_COMMUNITY)
Admission: EM | Disposition: A | Discharge status: Home / Self Care | Payer: Self-pay | Source: Home / Self Care | Attending: Emergency Medicine

## 2019-02-25 ENCOUNTER — Encounter (HOSPITAL_COMMUNITY): Payer: Self-pay | Admitting: Internal Medicine

## 2019-02-25 ENCOUNTER — Inpatient Hospital Stay (HOSPITAL_COMMUNITY): Payer: Medicare Other

## 2019-02-25 ENCOUNTER — Other Ambulatory Visit: Payer: Self-pay | Admitting: Student

## 2019-02-25 DIAGNOSIS — I214 Non-ST elevation (NSTEMI) myocardial infarction: Secondary | ICD-10-CM | POA: Diagnosis present

## 2019-02-25 DIAGNOSIS — I5032 Chronic diastolic (congestive) heart failure: Secondary | ICD-10-CM | POA: Diagnosis not present

## 2019-02-25 DIAGNOSIS — R072 Precordial pain: Secondary | ICD-10-CM

## 2019-02-25 DIAGNOSIS — Z953 Presence of xenogenic heart valve: Secondary | ICD-10-CM

## 2019-02-25 DIAGNOSIS — E039 Hypothyroidism, unspecified: Secondary | ICD-10-CM | POA: Diagnosis not present

## 2019-02-25 DIAGNOSIS — R778 Other specified abnormalities of plasma proteins: Secondary | ICD-10-CM

## 2019-02-25 DIAGNOSIS — R7989 Other specified abnormal findings of blood chemistry: Secondary | ICD-10-CM

## 2019-02-25 DIAGNOSIS — R0789 Other chest pain: Secondary | ICD-10-CM | POA: Diagnosis not present

## 2019-02-25 DIAGNOSIS — J449 Chronic obstructive pulmonary disease, unspecified: Secondary | ICD-10-CM

## 2019-02-25 HISTORY — PX: LEFT HEART CATH AND CORONARY ANGIOGRAPHY: CATH118249

## 2019-02-25 LAB — LIPID PANEL
Cholesterol: 133 mg/dL (ref 0–200)
HDL: 61 mg/dL (ref >40)
LDL Cholesterol: 57 mg/dL (ref 0–99)
Total CHOL/HDL Ratio: 2.2 RATIO
Triglycerides: 74 mg/dL (ref <150)
VLDL: 15 mg/dL (ref 0–40)

## 2019-02-25 LAB — CBC
HCT: 38.2 % (ref 36.0–46.0)
Hemoglobin: 12.1 g/dL (ref 12.0–15.0)
MCH: 28.3 pg (ref 26.0–34.0)
MCHC: 31.7 g/dL (ref 30.0–36.0)
MCV: 89.5 fL (ref 80.0–100.0)
Platelets: 209 10*3/uL (ref 150–400)
RBC: 4.27 MIL/uL (ref 3.87–5.11)
RDW: 12.2 % (ref 11.5–15.5)
WBC: 7.7 10*3/uL (ref 4.0–10.5)
nRBC: 0 % (ref 0.0–0.2)

## 2019-02-25 LAB — ECHOCARDIOGRAM COMPLETE
Height: 68 in
Weight: 2095.25 oz

## 2019-02-25 LAB — TROPONIN I (HIGH SENSITIVITY)
Troponin I (High Sensitivity): 1625 ng/L (ref <18)
Troponin I (High Sensitivity): 1246 ng/L (ref <18)
Troponin I (High Sensitivity): 1150 ng/L (ref <18)

## 2019-02-25 LAB — HEPARIN LEVEL (UNFRACTIONATED): Heparin Unfractionated: 0.28 IU/mL — ABNORMAL LOW (ref 0.30–0.70)

## 2019-02-25 LAB — PROTIME-INR
INR: 1 (ref 0.8–1.2)
Prothrombin Time: 13.2 seconds (ref 11.4–15.2)

## 2019-02-25 LAB — SARS CORONAVIRUS 2 BY RT PCR (HOSPITAL ORDER, PERFORMED IN ~~LOC~~ HOSPITAL LAB): SARS Coronavirus 2: NEGATIVE

## 2019-02-25 LAB — APTT: aPTT: 32 seconds (ref 24–36)

## 2019-02-25 SURGERY — LEFT HEART CATH AND CORONARY ANGIOGRAPHY
Anesthesia: LOCAL

## 2019-02-25 MED ORDER — SODIUM CHLORIDE 0.9 % WEIGHT BASED INFUSION: 1.0000 mL/kg/h | INTRAVENOUS | Status: DC

## 2019-02-25 MED ORDER — SODIUM CHLORIDE 0.9 % IV SOLN: 250.0000 mL | INTRAVENOUS | Status: DC | PRN

## 2019-02-25 MED ORDER — SODIUM CHLORIDE 0.9 % IV SOLN: INTRAVENOUS | Status: AC

## 2019-02-25 MED ORDER — SODIUM CHLORIDE 0.9% FLUSH: 3.0000 mL | Freq: Two times a day (BID) | INTRAVENOUS | Status: DC

## 2019-02-25 MED ORDER — FLUTICASONE FUROATE-VILANTEROL 100-25 MCG/INH IN AEPB
1.0000 | INHALATION_SPRAY | Freq: Every day | RESPIRATORY_TRACT | Status: DC
  Administered 2019-02-25: 1 via RESPIRATORY_TRACT
  Filled 2019-02-25: qty 28

## 2019-02-25 MED ORDER — LIDOCAINE HCL (PF) 1 % IJ SOLN
INTRAMUSCULAR | Status: DC | PRN
  Administered 2019-02-25: 2 mL

## 2019-02-25 MED ORDER — ASPIRIN 81 MG PO CHEW
324.0000 mg | CHEWABLE_TABLET | Freq: Once | ORAL | Status: DC
  Filled 2019-02-25: qty 4

## 2019-02-25 MED ORDER — FENTANYL CITRATE (PF) 100 MCG/2ML IJ SOLN
INTRAMUSCULAR | Status: DC | PRN
  Administered 2019-02-25: 25 ug via INTRAVENOUS

## 2019-02-25 MED ORDER — SODIUM CHLORIDE 0.9 % WEIGHT BASED INFUSION
3.0000 mL/kg/h | INTRAVENOUS | Status: AC
  Administered 2019-02-25: 3 mL/kg/h via INTRAVENOUS

## 2019-02-25 MED ORDER — ACETAMINOPHEN 325 MG PO TABS: 650.0000 mg | ORAL_TABLET | ORAL | Status: DC | PRN

## 2019-02-25 MED ORDER — IOHEXOL 350 MG/ML SOLN
75.0000 mL | Freq: Once | INTRAVENOUS | Status: AC | PRN
  Administered 2019-02-25: 01:00:00 75 mL via INTRAVENOUS

## 2019-02-25 MED ORDER — MIDAZOLAM HCL 2 MG/2ML IJ SOLN
INTRAMUSCULAR | Status: AC
  Filled 2019-02-25: qty 2

## 2019-02-25 MED ORDER — LIDOCAINE HCL (PF) 1 % IJ SOLN
INTRAMUSCULAR | Status: AC
  Filled 2019-02-25: qty 30

## 2019-02-25 MED ORDER — IOHEXOL 350 MG/ML SOLN
INTRAVENOUS | Status: DC | PRN
  Administered 2019-02-25: 45 mL

## 2019-02-25 MED ORDER — VERAPAMIL HCL 2.5 MG/ML IV SOLN
INTRAVENOUS | Status: AC
  Filled 2019-02-25: qty 2

## 2019-02-25 MED ORDER — ONDANSETRON HCL 4 MG/2ML IJ SOLN: 4.0000 mg | Freq: Four times a day (QID) | INTRAMUSCULAR | Status: DC | PRN

## 2019-02-25 MED ORDER — MIDAZOLAM HCL 2 MG/2ML IJ SOLN
INTRAMUSCULAR | Status: DC | PRN
  Administered 2019-02-25: 2 mg via INTRAVENOUS

## 2019-02-25 MED ORDER — UMECLIDINIUM BROMIDE 62.5 MCG/INH IN AEPB
1.0000 | INHALATION_SPRAY | Freq: Every day | RESPIRATORY_TRACT | Status: DC
  Administered 2019-02-25: 1 via RESPIRATORY_TRACT
  Filled 2019-02-25: qty 7

## 2019-02-25 MED ORDER — HEPARIN (PORCINE) IN NACL 1000-0.9 UT/500ML-% IV SOLN
INTRAVENOUS | Status: DC | PRN
  Administered 2019-02-25 (×3): 500 mL

## 2019-02-25 MED ORDER — FLUTICASONE-UMECLIDIN-VILANT 100-62.5-25 MCG/INH IN AEPB: 1.0000 | INHALATION_SPRAY | Freq: Every day | RESPIRATORY_TRACT | Status: DC

## 2019-02-25 MED ORDER — ASPIRIN EC 81 MG PO TBEC
81.0000 mg | DELAYED_RELEASE_TABLET | Freq: Every day | ORAL | Status: DC
  Administered 2019-02-25: 81 mg via ORAL
  Filled 2019-02-25: qty 1

## 2019-02-25 MED ORDER — SODIUM CHLORIDE 0.9% FLUSH: 3.0000 mL | INTRAVENOUS | Status: DC | PRN

## 2019-02-25 MED ORDER — HEPARIN SODIUM (PORCINE) 1000 UNIT/ML IJ SOLN
INTRAMUSCULAR | Status: DC | PRN
  Administered 2019-02-25: 3000 [IU] via INTRAVENOUS

## 2019-02-25 MED ORDER — VERAPAMIL HCL 2.5 MG/ML IV SOLN
INTRAVENOUS | Status: DC | PRN
  Administered 2019-02-25: 12:00:00 10 mL via INTRA_ARTERIAL

## 2019-02-25 MED ORDER — SODIUM CHLORIDE 0.9 % WEIGHT BASED INFUSION: 3.0000 mL/kg/h | INTRAVENOUS | Status: DC

## 2019-02-25 MED ORDER — FENTANYL CITRATE (PF) 100 MCG/2ML IJ SOLN
INTRAMUSCULAR | Status: AC
  Filled 2019-02-25: qty 2

## 2019-02-25 MED ORDER — HEPARIN (PORCINE) IN NACL 1000-0.9 UT/500ML-% IV SOLN
INTRAVENOUS | Status: AC
  Filled 2019-02-25: qty 1000

## 2019-02-25 MED ORDER — NITROGLYCERIN IN D5W 200-5 MCG/ML-% IV SOLN
0.0000 ug/min | INTRAVENOUS | Status: DC
  Administered 2019-02-25: 5 ug/min via INTRAVENOUS
  Filled 2019-02-25: qty 250

## 2019-02-25 MED ORDER — LEVOTHYROXINE SODIUM 75 MCG PO TABS: 175.0000 ug | ORAL_TABLET | Freq: Every day | ORAL | Status: DC

## 2019-02-25 SURGICAL SUPPLY — 10 items
CATH 5FR JL3.5 JR4 ANG PIG MP (CATHETERS) ×1 IMPLANT
DEVICE RAD COMP TR BAND LRG (VASCULAR PRODUCTS) ×1 IMPLANT
GLIDESHEATH SLEND SS 6F .021 (SHEATH) ×1 IMPLANT
GUIDEWIRE INQWIRE 1.5J.035X260 (WIRE) IMPLANT
INQWIRE 1.5J .035X260CM (WIRE) ×2
KIT HEART LEFT (KITS) ×2 IMPLANT
PACK CARDIAC CATHETERIZATION (CUSTOM PROCEDURE TRAY) ×2 IMPLANT
SHEATH PROBE COVER 6X72 (BAG) ×1 IMPLANT
TRANSDUCER W/STOPCOCK (MISCELLANEOUS) ×2 IMPLANT
TUBING CIL FLEX 10 FLL-RA (TUBING) ×2 IMPLANT

## 2019-02-25 NOTE — Progress Notes (Signed)
  Echocardiogram 2D Echocardiogram has been performed.  Verona Hartshorn G Machele Deihl 02/25/2019, 10:00 AM

## 2019-02-25 NOTE — Consult Note (Signed)
Cardiology Consult    Patient ID: Joy Williams MRN: [id number], DOB/AGE: Jan 08, 1951   Admit date: 02/24/2019 Date of Consult: 02/25/2019  Primary Physician: Mellody Dance, DO Primary Cardiologist: Ena Dawley, MD Requesting Provider: Theotis Burrow, MD  Patient Profile    Joy Williams is a 68 y.o. 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   History of Present Illness    She reports having heaviness in her chest when she woke up yesterday morning (10/7) that persisted throughout the day. Her daughter is getting married in 2 days at their home, so she was on her feet for a good part of the day making preparations, going to get manicures, etc and did not notice any change in the severity with activity. There was maybe some increase in the pressure at the "top of her breath" but no significant pleuritic component and the pressure is not positional or reproducible. She did take ibuprofen earlier in the day which did dull the pain somewhat. The pressure was constant and significant enough that she came to the emergency department. She has had significant relief with nitroglycerin in the ED and now has minimal discomfort on low-dose nitro infusion. She does not recall ever having anything like this before. There was no associated dyspnea, palpitations, nausea, or diaphoresis. She denies any orthopnea, PND, leg swelling, abdominal bloating, or syncope. No hemoptysis, fevers, chills, vomiting, diarrhea, rash, recent illness, or prior chest trauma/injury.  VS have overall been  normal. ECG showed sinus rhythm with no significant abnormalities or ischemia. Her troponin however was elevated to 1471. D-dimer was mildly elevated at 1.52, though PE or aortopathy has been ruled out by CTA, only significant for unchanged apical and lower lobe scarring. She received full dose aspirin with EMS has been started on heparin. Repeat troponin this morning is 1625, still no ECG changes on repeat.   Of note, she underwent coronary angiography at the time of her valve surgery in 2017 that showed normal coronaries.   Past Medical History   Past Medical History:  Diagnosis Date  . Allergy   . Asthma    pt denies this, but is on Dulera  . Chronic diastolic congestive heart failure ([place])   . 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 COPD (chronic obstructive pulmonary disease) ([place])   . Family history of adverse reaction to anesthesia    Mother- very sensitive to medication  . Heart murmur    MVP  . Hypothyroidism   . Lung cancer ([place]) 12/30/2014   Synchronous primaries:  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, each treated by wedge resection with post-op adjuvant [facility]Rx at [facility]  . MVP (mitral valve prolapse)   . PONV (postoperative nausea and vomiting)   . Raynaud's disease    Raynaud's  . Raynaud's syndrome   . 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   31 mm [facility] mitral bovine bioprosthetic tissue valve placed via right mini thoracotomy approach  . Severe mitral regurgitation 11/15/2014  . Shortness of  breath dyspnea    with exertion  . STD (sexually transmitted disease)   . Telangiectasia   . Thyroid disease     Past Surgical History:  Procedure Laterality Date  . BACK SURGERY     x 3  Disectomy  . BREAST BIOPSY Left   . CARDIAC CATHETERIZATION N/A 03/08/2016   Procedure: Right/Left Heart Cath and Coronary  Angiography;  Surgeon: Sherren Mocha, MD;  Location: [place] [facility];  Service: Cardiovascular;  Laterality: N/A;  . CLAVICLE SURGERY Left 2013   plate to left collar bone  . LAPAROSCOPY     ? reason-age 30   . LUNG CANCER SURGERY    . MITRAL VALVE REPAIR Right 06/27/2016   Procedure: MINIMALLY INVASIVE MITRAL VALVE REPLACEMENT;  Surgeon: Rexene Alberts, MD;  Location: [place];  Service: Open Heart Surgery;  Laterality: Right;  . TEE WITHOUT CARDIOVERSION N/A 02/22/2016   Procedure: TRANSESOPHAGEAL ECHOCARDIOGRAM (TEE);  Surgeon: Dorothy Spark, MD;  Location: [place];  Service: Cardiovascular;  Laterality: N/A;  . TEE WITHOUT CARDIOVERSION N/A 06/27/2016   Procedure: TRANSESOPHAGEAL ECHOCARDIOGRAM (TEE);  Surgeon: Rexene Alberts, MD;  Location: [place];  Service: Open Heart Surgery;  Laterality: N/A;  . TONSILLECTOMY    . VIDEO ASSISTED THORACOSCOPY (VATS)/WEDGE RESECTION Left 12/30/2014   Bronchoscopy, Mediastinoscopy, Left VATS for Wedge resection LUL x2 adn LLL x1 - Dr. Elenor Quinones at [facility]  . VOCAL CORD INJECTION Left 2017   injected with botox  . wrist surgery Left 2015   plate to wrist      Allergies  Allergen Reactions  . Penicillins Hives    Unknown, occurred as a child Has patient had a PCN reaction causing immediate rash, facial/tongue/throat swelling, SOB or lightheadedness with hypotension: Yes Has patient had a PCN reaction causing severe rash involving mucus membranes or skin necrosis: No Has patient had a PCN reaction that required hospitalization  Has patient had a PCN reaction occurring within the last 10 years: No If all of the above answers are "NO", then may proceed with Cephalosporin use. No   Inpatient Medications    . aspirin EC  81 mg Oral Daily  . Fluticasone-Umeclidin-Vilant  1 puff Inhalation Daily  . levothyroxine  175 mcg Oral QAC breakfast    Family History    Family History  Problem Relation Age of Onset  . Mitral valve prolapse Mother   .  Dementia Father   . Prostate cancer Father   . Mitral valve prolapse Brother   . Mitral valve prolapse Sister   . Colon cancer Neg Hx   . Colon polyps Neg Hx   . Rectal cancer Neg Hx   . Esophageal cancer Neg Hx   . Stomach cancer Neg Hx   . Heart attack Neg Hx    She indicated that her mother is deceased. She indicated that her father is alive. She indicated that only one of her two sisters is alive. She indicated that only one of her two brothers is alive. She indicated that her maternal grandmother is deceased. She indicated that her maternal grandfather is deceased. She indicated that her paternal grandmother is deceased. She indicated that her paternal grandfather is deceased. She indicated that the status of her neg hx is unknown.   Social History    Social History   Socioeconomic History  . Marital status: Divorced    Spouse name: Not on file  . Number of children: Not on file  . Years of education: Not  on file  . Highest education level: Not on file  Occupational History  . Not on file  Social Needs  . Financial resource strain: Not on file  . Food insecurity    Worry: Not on file    Inability: Not on file  . Transportation needs    Medical: Not on file    Non-medical: Not on file  Tobacco Use  . Smoking status: Current Every Day Smoker    Packs/day: 0.25    Years: 24.00    Pack years: 6.00    Types: E-cigarettes, Cigarettes  . Smokeless tobacco: Never Used  Substance and Sexual Activity  . Alcohol use: Yes    Alcohol/week: 2.0 standard drinks    Types: 1 Shots of liquor, 1 Standard drinks or equivalent per week  . Drug use: No  . Sexual activity: Never    Partners: Male    Birth control/protection: Post-menopausal  Lifestyle  . Physical activity    Days per week: Not on file    Minutes per session: Not on file  . Stress: Not on file  Relationships  . Social Herbalist on phone: Not on file    Gets together: Not on file    Attends religious  service: Not on file    Active member of club or organization: Not on file    Attends meetings of clubs or organizations: Not on file    Relationship status: Not on file  . Intimate partner violence    Fear of current or ex partner: Not on file    Emotionally abused: Not on file    Physically abused: Not on file    Forced sexual activity: Not on file  Other Topics Concern  . Not on file  Social History Narrative  . Not on file     Review of Systems    General:  No chills, fever, night sweats or weight changes.  Cardiovascular:  No chest pain, dyspnea on exertion, edema, orthopnea, palpitations, paroxysmal nocturnal dyspnea. Dermatological: No rash, lesions/masses Respiratory: No cough, dyspnea Urologic: No hematuria, dysuria Abdominal:   No nausea, vomiting, diarrhea, bright red blood per rectum, melena, or hematemesis Neurologic:  No visual changes, wkns, changes in mental status. All other systems reviewed and are otherwise negative except as noted above.  Physical Exam    Blood pressure 115/64, pulse 64, temperature 97.9 F (36.6 C), resp. rate 16, height 5\' 8"  (1.727 m), weight 59.4 kg, SpO2 98 %.    No intake or output data in the 24 hours ending 02/25/19 0513 Wt Readings from Last 3 Encounters:  02/24/19 59.4 kg  11/16/18 59.4 kg  10/22/18 59.4 kg    CONSTITUTIONAL: alert and conversant, well-appearing, nourished, no distress HEENT: oropharynx clear and moist, no mucosal lesions, normal dentition, conjunctiva normal, EOM intact, pupils equal, no lid lag. CARDIOVASCULAR: Regular rhythm. 2/6 systolic murmur. No gallop or rub. Normal S1/S2. Radial pulses 2+. JVP is normal. No carotid bruits. PULMONARY/CHEST WALL: no deformities. Mild, diffuse, scattered, expiratory wheeze. Normal work of breathing ABDOMINAL: soft, non-tender, non-distended EXTREMITIES: no edema or muscle atrophy, warm and well-perfused SKIN: Dry and intact without apparent rashes or wounds. NEUROLOGIC:  alert, no abnormal movements, cranial nerves grossly intact.   Labs    HS Troponin:  1471, 1625  Lab Results  Component Value Date   WBC 7.1 02/24/2019   HGB 13.3 02/24/2019   HCT 41.2 02/24/2019   MCV 90.2 02/24/2019   PLT 204 02/24/2019  Recent Labs  Lab 02/24/19 2212  NA 133*  K 4.3  CL 98  CO2 24  BUN 10  CREATININE 0.69  CALCIUM 9.5  PROT 7.5  BILITOT 0.9  ALKPHOS 101  ALT 31  AST 47*  GLUCOSE 85   Lab Results  Component Value Date   CHOL 150 11/11/2018   HDL 70 11/11/2018   LDLCALC 64 11/11/2018   TRIG 78 11/11/2018   Lab Results  Component Value Date   DDIMER 1.52 (H) 02/24/2019     Radiology Studies    Dg Chest 2 View  Result Date: 02/24/2019 CLINICAL DATA:  Chest pressure and pain EXAM: CHEST - 2 VIEW COMPARISON:  05/25/2017 FINDINGS: Cardiac shadows within normal limits. Postsurgical changes are seen. The lungs are well aerated bilaterally. Chronic fibrotic changes are noted in the left base. No sizable infiltrate or effusion is seen. No bony abnormality is noted. Prior fixation of the left clavicle is noted. IMPRESSION: Chronic changes without acute abnormality. Electronically Signed   By: Inez Catalina M.D.   On: 02/24/2019 21:40   Ct Angio Chest Pe W And/or Wo Contrast  Result Date: 02/25/2019 CLINICAL DATA:  Chest pressure, history of lung cancer and mitral valve replacement EXAM: CT ANGIOGRAPHY CHEST WITH CONTRAST TECHNIQUE: Multidetector CT imaging of the chest was performed using the standard protocol during bolus administration of intravenous contrast. Multiplanar CT image reconstructions and MIPs were obtained to evaluate the vascular anatomy. CONTRAST:  7mL OMNIPAQUE IOHEXOL 350 MG/ML SOLN COMPARISON:  May 01, 2016 FINDINGS: Cardiovascular: There is a optimal opacification of the pulmonary arteries. There is no central,segmental, or subsegmental filling defects within the pulmonary arteries. There is mild cardiomegaly. Prosthetic mitral  valve is seen. There is normal three-vessel brachiocephalic anatomy without proximal stenosis. Mild aortic atherosclerosis is seen. Mediastinum/Nodes: No hilar, mediastinal, or axillary adenopathy. Scattered small pretracheal lymph nodes are noted. He thyroid gland, trachea, and esophagus demonstrate no significant findings. Lungs/Pleura: Again seen is surgical suture is in the left upper lung and left lower lobe. There are stable areas of scarring seen within the left upper lung. Hazy/patchy airspace opacity seen in the posterior peripheral lower lung as on the prior exam with interstitial thickening. No new areas of consolidation. No pleural effusion. Upper Abdomen: No acute abnormalities present in the visualized portions of the upper abdomen. Musculoskeletal: No chest wall abnormality. No acute or significant osseous findings. Prior plate fixation of a healed left clavicular fracture seen. Review of the MIP images confirms the above findings. IMPRESSION: No central, segmental, or subsegmental pulmonary embolism. Prosthetic mitral valve. Stable areas of scarring in both lung apices and posterior lower lobes. No acute intrathoracic pathology. Electronically Signed   By: Prudencio Pair M.D.   On: 02/25/2019 01:43    ECG & Cardiac Imaging    ECG: NSR, probable left atrial enlargement, nonspecific ST-T changes - personally reviewed.  Assessment & Plan    NSTEMI vs stress cardiomyopathy: she has atypical (non-exertional) chest pain, though is relieved with nitroglycerin. Despite her normal cath a few years ago, she has continued to smoke and I can't identify any alternate etiology for either her chest discomfort nor her troponin elevation, which has also risen slightly. Her ECG shows no specific ischemic changes. LDL in June was 64. PE, aortopathy, and new pulmonary pathology have been ruled out. She is essentially normotensive, no signs of heart failure, and this is not consistent with pericarditis. No recent  illness or underlying cause of possible myocarditis. The only  thing I cannot exclude at this time is acute stress cardiomyopathy with the stress of her daughters wedding, but would be a diagnosis of exclusion. She also wants peace of mind, so I would favor proceeding with left heart cath today. - Continue daily aspirin and heparin ggt - Agree with nitroglycerin given her response, can be titrated for chest pain - Hold Plavix for this morning - we may transition to ticagrelor if we proceed to PCI - Increase atorvastatin to 80mg  daily - TTE in the morning - Continue to trend troponin every 4 hours - Monitor on telemetry, daily ECG - Keep NPO for left heart cath, she is in favor of proceeding and has no contraindications to PCI  Signed, Marykay Lex, MD 02/25/2019, 5:13 AM  For questions or updates, please contact   Please consult [email or web address] for contact info under Cardiology/STEMI.

## 2019-02-25 NOTE — Care Management Obs Status (Signed)
[facility] NOTIFICATION   Patient Details  Name: Lada Fulbright MRN: [id number] Date of Birth: 05-08-1951   Medicare Observation Status Notification Given:  Yes    Dawayne Patricia, RN 02/25/2019, 3:33 PM

## 2019-02-25 NOTE — H&P (View-Only) (Signed)
Progress Note  Patient Name: Joy Williams Date of Encounter: 02/25/2019  Primary Cardiologist: Ena Dawley, MD   Subjective   Patient admitted overnight with chest pain. Troponin peaked at 1,625. Pain significantly improved with Nitro. Only has very minimal dull discomfort at this time that she describes as a "lightness." No shortness of breath at this time.  Inpatient Medications    Scheduled Meds:  aspirin EC  81 mg Oral Daily   fluticasone furoate-vilanterol  1 puff Inhalation Daily   And   umeclidinium bromide  1 puff Inhalation Daily   levothyroxine  175 mcg Oral QAC breakfast   Continuous Infusions:  heparin 800 Units/hr (02/25/19 0655)   nitroGLYCERIN Stopped (02/25/19 0828)   PRN Meds: acetaminophen, ondansetron (ZOFRAN) IV   Vital Signs    Vitals:   02/25/19 0430 02/25/19 0648 02/25/19 0738 02/25/19 0822  BP: 115/64 122/65 123/78 (!) 105/58  Pulse: 64 64 66 63  Resp: 16 18 18 20   Temp:  (!) 97.5 F (36.4 C)    TempSrc:  Oral    SpO2: 98% 100% 100% 100%  Weight:      Height:        Intake/Output Summary (Last 24 hours) at 02/25/2019 0846 Last data filed at 02/25/2019 0828 Gross per 24 hour  Intake 20 ml  Output --  Net 20 ml   Last 3 Weights 02/24/2019 11/16/2018 10/22/2018  Weight (lbs) 130 lb 15.3 oz 131 lb 131 lb  Weight (kg) 59.4 kg 59.421 kg 59.421 kg      Telemetry    Normal sinus rhythm with rates in the 60's to 70's and PVCs. - Personally Reviewed  ECG    Normal sinus rhythm, rate 67 bpm, with LVH and ST elevation in anterior leads consistent with early repolarization and borderline QTc prolongation of 489 ms. - Personally Reviewed  Physical Exam   GEN: No acute distress.   Neck: Supple. No JVD Cardiac: RRR. No murmurs, gallops, or rubs. Radial and distal pedal pulses 2+ and equal bilaterally. Respiratory: Clear to auscultation bilaterally.  GI: Soft, non-distended, and non-tender. Bowel sounds present. MS: No edema. No  deformity. Neuro:  No focal deficits.  Psych: Normal affect. Responds appropriately.  Labs    High Sensitivity Troponin:   Recent Labs  Lab 02/24/19 2212 02/25/19 0345 02/25/19 0528 02/25/19 0636  TROPONINIHS 1,471* 1,625* 1,246* 1,150*      Chemistry Recent Labs  Lab 02/24/19 2212  NA 133*  K 4.3  CL 98  CO2 24  GLUCOSE 85  BUN 10  CREATININE 0.69  CALCIUM 9.5  PROT 7.5  ALBUMIN 4.3  AST 47*  ALT 31  ALKPHOS 101  BILITOT 0.9  GFRNONAA >60  GFRAA >60  ANIONGAP 11     Hematology Recent Labs  Lab 02/24/19 2212 02/25/19 0528  WBC 7.1 7.7  RBC 4.57 4.27  HGB 13.3 12.1  HCT 41.2 38.2  MCV 90.2 89.5  MCH 29.1 28.3  MCHC 32.3 31.7  RDW 12.2 12.2  PLT 204 209    BNPNo results for input(s): BNP, PROBNP in the last 168 hours.   DDimer  Recent Labs  Lab 02/24/19 2212  DDIMER 1.52*     Radiology    Dg Chest 2 View  Result Date: 02/24/2019 CLINICAL DATA:  Chest pressure and pain EXAM: CHEST - 2 VIEW COMPARISON:  05/25/2017 FINDINGS: Cardiac shadows within normal limits. Postsurgical changes are seen. The lungs are well aerated bilaterally. Chronic fibrotic changes are noted in  the left base. No sizable infiltrate or effusion is seen. No bony abnormality is noted. Prior fixation of the left clavicle is noted. IMPRESSION: Chronic changes without acute abnormality. Electronically Signed   By: Inez Catalina M.D.   On: 02/24/2019 21:40   Ct Angio Chest Pe W And/or Wo Contrast  Result Date: 02/25/2019 CLINICAL DATA:  Chest pressure, history of lung cancer and mitral valve replacement EXAM: CT ANGIOGRAPHY CHEST WITH CONTRAST TECHNIQUE: Multidetector CT imaging of the chest was performed using the standard protocol during bolus administration of intravenous contrast. Multiplanar CT image reconstructions and MIPs were obtained to evaluate the vascular anatomy. CONTRAST:  81mL OMNIPAQUE IOHEXOL 350 MG/ML SOLN COMPARISON:  May 01, 2016 FINDINGS: Cardiovascular:  There is a optimal opacification of the pulmonary arteries. There is no central,segmental, or subsegmental filling defects within the pulmonary arteries. There is mild cardiomegaly. Prosthetic mitral valve is seen. There is normal three-vessel brachiocephalic anatomy without proximal stenosis. Mild aortic atherosclerosis is seen. Mediastinum/Nodes: No hilar, mediastinal, or axillary adenopathy. Scattered small pretracheal lymph nodes are noted. He thyroid gland, trachea, and esophagus demonstrate no significant findings. Lungs/Pleura: Again seen is surgical suture is in the left upper lung and left lower lobe. There are stable areas of scarring seen within the left upper lung. Hazy/patchy airspace opacity seen in the posterior peripheral lower lung as on the prior exam with interstitial thickening. No new areas of consolidation. No pleural effusion. Upper Abdomen: No acute abnormalities present in the visualized portions of the upper abdomen. Musculoskeletal: No chest wall abnormality. No acute or significant osseous findings. Prior plate fixation of a healed left clavicular fracture seen. Review of the MIP images confirms the above findings. IMPRESSION: No central, segmental, or subsegmental pulmonary embolism. Prosthetic mitral valve. Stable areas of scarring in both lung apices and posterior lower lobes. No acute intrathoracic pathology. Electronically Signed   By: Prudencio Pair M.D.   On: 02/25/2019 01:43    Cardiac Studies   Echocardiogram 11/12/2018: Impressions:  1. The left ventricle has normal systolic function with an ejection fraction of 60-65%. The cavity size was normal. There is mildly increased left ventricular wall thickness. Left ventricular diastolic Doppler parameters are indeterminate. No evidence  of left ventricular regional wall motion abnormalities.  2. The right ventricle has normal systolic function. The cavity was normal. There is no increase in right ventricular wall thickness.   3. Bioprosthetic mitral valve. No significant stenosis, mean gradient 2 mmHg. Trivial mitral regurgitation.  4. The aortic valve is tricuspid. No stenosis of the aortic valve.  5. The ascending aorta is normal in size and structure.  6. There is mild dilatation of the aortic root measuring 37 mm.  7. Left atrial size was moderately dilated.  8. Possible PFO by color doppler.  9. Normal IVC size with PA systolic pressure 31 mmHg. _______________  Carotid Ultrasounds 03/12/2018: Summary: - Right Carotid: Velocities in the right ICA are consistent with a 1-39% stenosis. - Left Carotid: Velocities in the left ICA are consistent with a 1-39% stenosis. - Vertebrals: Bilateral vertebral arteries demonstrate antegrade flow. _______________  Right/Left Heart Catheterization 03/08/2016: 1. Angiographically normal coronary arteries 2. Normal intracardiac pressures in both the right and left heart chambers with preserved cardiac output 3. Severely calcified mitral annulus with known bileaflet mitral valve prolapse and severe mitral regurgitation  Patient Profile   Ms. Fountaine is a 68 y.o. 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 Hal Hope.   Assessment & Plan    NSTEMI - Patient admitted overnight for chest pain. Patient had normal coronaries on cath in 2017. - EKG normal sinus rhythm with LVH and early repolarization changes. - High-sensitivity troponin elevated at 1,471 >> 1,625 >>1,246 >> 1,150. - Currently on IV Heparin and IV Nitro. - Patient only reports very minimal pain at this time. - Continue IV Heparin.  -  BP borderline soft so can try off Nitro. - Continue aspirin and statin. LDL 57. Also on Plavix at home due to prior mini stroke. This was held on admission but would be OK to continue. - Will check hemoglobin A1c. - Will order Echo - Presentation somewhat atypical. Non-exertional but did improve with Nitro. Given clean coronaries 3 years ago, think significant blockage unlikely. Possibly stress cardiomyopathy or coronary spasms given Raynaud's. Plan for left heart catheterization later today. The patient understands that risks include but are not limited to stroke (1 in 1000), death (1 in 2), kidney failure [usually temporary] (1 in 500), bleeding (1 in 200), allergic reaction [possibly serious] (1 in 200), and agrees to proceed.   MVP with Mitral Regurgitation s/p Bioprosthetic MVR in 06/2016 - Most recent Echo from 10/2018 showed LVEF of 60-65% with trivial regurgitation and no stenosis of the bioprosthetic valve. - No murmur on exam. - Will monitor on repeat Echo.  Tobacco Use - Patient initially quit after lung cancer diagnosis in 2016 but started back again about 1 year later. Currently smoking about 1/2 pack per day.  - Emphasized the importance of complete cessation.  CVA - Patient reports mini stroke in 2019. - On Plavix.    For questions or updates, please contact [place] Please consult [email or web address] for contact info under        Signed, Darreld Mclean, PA-C  02/25/2019, 8:46 AM

## 2019-02-25 NOTE — Progress Notes (Signed)
Discussed discharge instructions via phone

## 2019-02-25 NOTE — Discharge Summary (Signed)
Physician Discharge Summary  Joy Williams PTW:[id number] DOB: 06-22-1950 DOA: 02/24/2019  PCP: Mellody Dance, DO  Admit date: 02/24/2019 Discharge date: 02/25/2019  Admitted From: Home Disposition:  Home  Recommendations for Outpatient Follow-up:  1. Follow up with PCP in 1-2 weeks 2. Follow up with Cardiology as scheduled  Discharge Condition:Stable CODE STATUS:Full Diet recommendation: Heart healthy   Brief/Interim Summary: 68 y.o. female with history of bioprosthetic mitral valve replacement in 2018, COPD with ongoing tobacco abuse, hypothyroidism presents to the ER with complaints of chest pain.  Patient has been having chest pressure since yesterday morning.  Present even at rest.  Pressure is mostly retrosternal.  Has no relation to exertion.  Denies any associated fever chills productive cough nausea vomiting or diarrhea.  Given the symptoms patient presented to the ER later in the evening.  ED Course: In the ER EKG shows normal sinus rhythm with nonspecific ST-T changes LVH findings.  CT angiogram of the chest was negative for PE.  Labs show high-sensitivity troponin of 1400 and 1600.  On-call cardiologist was notified.  Patient was started on nitroglycerin given the substernal chest pressure positive troponin and was started on heparin.  Was given aspirin.  Admitted for possible non-ST relation MI.  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 Chest pain concerning for non-ST relation MI given the  elevated troponin 1. Cardiology consulted 2. Patient was initially continued on heparin gtt and later underwent heart cath on 10/8 which was found to be unremarkable 3. Discussed with Cardiology. Plan for outpatient Cardiac MRI on discharge 4. Will resume home meds 5. Pt remains stable at this time 2. History of bioprosthetic mitral valve replacement  1. Remained stable 3. Chronic diastolic CHF appears compensated. 4. COPD -no active wheezing at this time. 5. Hypothyroidism on Synthroid. 6. History of TIA. 7. Tobacco abuse -advised about quitting.  Discharge Instructions   Allergies as of 02/25/2019      Reactions   Penicillins Hives   Unknown, occurred as a child Has patient had a PCN reaction causing immediate rash, facial/tongue/throat swelling, SOB or lightheadedness with hypotension: Yes Has patient had a PCN reaction causing severe rash involving mucus membranes or skin necrosis: No Has patient had a PCN reaction that required hospitalization  Has patient had a PCN reaction occurring within the last 10 years: No If all of the above answers are "NO", then may proceed with Cephalosporin use. No      Medication List    TAKE these medications   acetaminophen 500 MG tablet Commonly known as: TYLENOL Take 1,000 mg by mouth every 6 (six) hours as needed for mild pain or headache.   atorvastatin 20 MG tablet Commonly known as: LIPITOR Take 1 tablet (20 mg total) by mouth daily at 6 PM.   azelastine 0.1 % nasal spray Commonly known as: ASTELIN Place 2 sprays into both nostrils 2 (two) times daily. Use in each nostril as directed   clopidogrel 75 MG tablet Commonly known as: PLAVIX Take 1 tablet (75 mg total) by mouth daily.  fluticasone 50 MCG/ACT nasal spray Commonly known as: FLONASE 1 SPRAY EACH NOSTRIL FOLLOWING SINUS RINSES TWICE DAILY What changed: See the new instructions.   Fluticasone-Umeclidin-Vilant 100-62.5-25 MCG/INH Aepb Commonly known as: Trelegy  Ellipta Inhale 1 puff into the lungs daily.   ibuprofen 200 MG tablet Commonly known as: ADVIL Take 400 mg by mouth every 6 (six) hours as needed for moderate pain.   levothyroxine 175 MCG tablet Commonly known as: SYNTHROID Take 1 tablet (175 mcg total) by mouth daily before breakfast.   multivitamin with minerals tablet Take 1 tablet by mouth daily.   naproxen sodium 220 MG tablet Commonly known as: ALEVE Take 220 mg by mouth as needed (pain).   Turmeric 500 MG Caps Take 500 mg by mouth daily.   Vitamin D3 125 MCG (5000 UT) Tabs 5,000 IU OTC vitamin D3 daily. What changed:   how much to take  how to take this  when to take this  additional instructions      Follow-up Information    Maham, Quintin, DO. Schedule an appointment as soon as possible for a visit in 2 week(s).   Specialty: Family Medicine Contact information: [place] [address] [phone]        Dorothy Spark, MD .   Specialty: Cardiology Contact information: [place] [address] [phone]          Allergies  Allergen Reactions  . Penicillins Hives    Unknown, occurred as a child Has patient had a PCN reaction causing immediate rash, facial/tongue/throat swelling, SOB or lightheadedness with hypotension: Yes Has patient had a PCN reaction causing severe rash involving mucus membranes or skin necrosis: No Has patient had a PCN reaction that required hospitalization  Has patient had a PCN reaction occurring within the last 10 years: No If all of the above answers are "NO", then may proceed with Cephalosporin use. No    Consultations:  Cardiology  Procedures/Studies: Dg Chest 2 View  Result Date: 02/24/2019 CLINICAL DATA:  Chest pressure and pain EXAM: CHEST - 2 VIEW COMPARISON:  05/25/2017 FINDINGS: Cardiac shadows within normal limits. Postsurgical changes are seen. The lungs are well aerated bilaterally. Chronic  fibrotic changes are noted in the left base. No sizable infiltrate or effusion is seen. No bony abnormality is noted. Prior fixation of the left clavicle is noted. IMPRESSION: Chronic changes without acute abnormality. Electronically Signed   By: Inez Catalina M.D.   On: 02/24/2019 21:40   Ct Angio Chest Pe W And/or Wo Contrast  Result Date: 02/25/2019 CLINICAL DATA:  Chest pressure, history of lung cancer and mitral valve replacement EXAM: CT ANGIOGRAPHY CHEST WITH CONTRAST TECHNIQUE: Multidetector CT imaging of the chest was performed using the standard protocol during bolus administration of intravenous contrast. Multiplanar CT image reconstructions and MIPs were obtained to evaluate the vascular anatomy. CONTRAST:  61mL OMNIPAQUE IOHEXOL 350 MG/ML SOLN COMPARISON:  May 01, 2016 FINDINGS: Cardiovascular: There is a optimal opacification of the pulmonary arteries. There is no central,segmental, or subsegmental filling defects within the pulmonary arteries. There is mild cardiomegaly. Prosthetic mitral valve is seen. There is normal three-vessel brachiocephalic anatomy without proximal stenosis. Mild aortic atherosclerosis is seen. Mediastinum/Nodes: No hilar, mediastinal, or axillary adenopathy. Scattered small pretracheal lymph nodes are noted. He thyroid gland, trachea, and esophagus demonstrate no significant findings. Lungs/Pleura: Again seen is surgical suture is in the left upper lung and left lower lobe. There are stable areas of scarring seen  within the left upper lung. Hazy/patchy airspace opacity seen in the posterior peripheral lower lung as on the prior exam with interstitial thickening. No new areas of consolidation. No pleural effusion. Upper Abdomen: No acute abnormalities present in the visualized portions of the upper abdomen. Musculoskeletal: No chest wall abnormality. No acute or significant osseous findings. Prior plate fixation of a healed left clavicular fracture seen. Review of the  MIP images confirms the above findings. IMPRESSION: No central, segmental, or subsegmental pulmonary embolism. Prosthetic mitral valve. Stable areas of scarring in both lung apices and posterior lower lobes. No acute intrathoracic pathology. Electronically Signed   By: Prudencio Pair M.D.   On: 02/25/2019 01:43    Subjective: Eager to go home  Discharge Exam: Vitals:   02/25/19 1435 02/25/19 1438  BP: (!) 110/45 (!) 110/45  Pulse:  66  Resp:  16  Temp:  98 F (36.7 C)  SpO2: 99% 100%   Vitals:   02/25/19 1210 02/25/19 1400 02/25/19 1435 02/25/19 1438  BP: (!) 118/53  (!) 110/45 (!) 110/45  Pulse: 60 72  66  Resp:  (!) 23  16  Temp:    98 F (36.7 C)  TempSrc:    Oral  SpO2:  98% 99% 100%  Weight:      Height:        General: Pt is alert, awake, not in acute distress Cardiovascular: RRR, S1/S2 +, no rubs, no gallops Respiratory: CTA bilaterally, no wheezing, no rhonchi Abdominal: Soft, NT, ND, bowel sounds + Extremities: no edema, no cyanosis   The results of significant diagnostics from this hospitalization (including imaging, microbiology, ancillary and laboratory) are listed below for reference.     Microbiology: Recent Results (from the past 240 hour(s))  SARS Coronavirus 2 [facility] order, Performed in [facility] hospital lab) Nasopharyngeal Nasopharyngeal Swab     Status: None   Collection Time: 02/25/19 12:04 AM   Specimen: Nasopharyngeal Swab  Result Value Ref Range Status   SARS Coronavirus 2 NEGATIVE NEGATIVE Final    Comment: (NOTE) If result is NEGATIVE SARS-CoV-2 target nucleic acids are NOT DETECTED. The SARS-CoV-2 RNA is generally detectable in upper and lower  respiratory specimens during the acute phase of infection. The lowest  concentration of SARS-CoV-2 viral copies this assay can detect is 250  copies / mL. A negative result does not preclude SARS-CoV-2 infection  and should not be used as the sole basis for treatment or other  patient  management decisions.  A negative result may occur with  improper specimen collection / handling, submission of specimen other  than nasopharyngeal swab, presence of viral mutation(s) within the  areas targeted by this assay, and inadequate number of viral copies  (<250 copies / mL). A negative result must be combined with clinical  observations, patient history, and epidemiological information. If result is POSITIVE SARS-CoV-2 target nucleic acids are DETECTED. The SARS-CoV-2 RNA is generally detectable in upper and lower  respiratory specimens dur ing the acute phase of infection.  Positive  results are indicative of active infection with SARS-CoV-2.  Clinical  correlation with patient history and other diagnostic information is  necessary to determine patient infection status.  Positive results do  not rule out bacterial infection or co-infection with other viruses. If result is PRESUMPTIVE POSTIVE SARS-CoV-2 nucleic acids MAY BE PRESENT.   A presumptive positive result was obtained on the submitted specimen  and confirmed on repeat testing.  While 2019 novel coronavirus  (SARS-CoV-2) nucleic acids may  be present in the submitted sample  additional confirmatory testing may be necessary for epidemiological  and / or clinical management purposes  to differentiate between  SARS-CoV-2 and other Sarbecovirus currently known to infect humans.  If clinically indicated additional testing with an alternate test  methodology [phone]) is advised. The SARS-CoV-2 RNA is generally  detectable in upper and lower respiratory sp ecimens during the acute  phase of infection. The expected result is Negative. Fact Sheet for Patients:  StrictlyIdeas.no Fact Sheet for Healthcare Providers: BankingDealers.co.za This test is not yet approved or cleared by the [place] FDA and has been authorized for detection and/or diagnosis of SARS-CoV-2 by FDA under  an Emergency Use Authorization (EUA).  This EUA will remain in effect (meaning this test can be used) for the duration of the COVID-19 declaration under Section 564(b)(1) of the Act, 21 U.S.C. section 360bbb-3(b)(1), unless the authorization is terminated or revoked sooner. Performed at [facility], [place] [address] [place],  [address]      Labs: BNP (last 3 results) No results for input(s): BNP in the last 8760 hours. Basic Metabolic Panel: Recent Labs  Lab 02/24/19 2212  NA 133*  K 4.3  CL 98  CO2 24  GLUCOSE 85  BUN 10  CREATININE 0.69  CALCIUM 9.5   Liver Function Tests: Recent Labs  Lab 02/24/19 2212  AST 47*  ALT 31  ALKPHOS 101  BILITOT 0.9  PROT 7.5  ALBUMIN 4.3   No results for input(s): LIPASE, AMYLASE in the last 168 hours. No results for input(s): AMMONIA in the last 168 hours. CBC: Recent Labs  Lab 02/24/19 2212 02/25/19 0528  WBC 7.1 7.7  NEUTROABS 4.8  --   HGB 13.3 12.1  HCT 41.2 38.2  MCV 90.2 89.5  PLT 204 209   Cardiac Enzymes: No results for input(s): CKTOTAL, CKMB, CKMBINDEX, TROPONINI in the last 168 hours. BNP: Invalid input(s): POCBNP CBG: No results for input(s): GLUCAP in the last 168 hours. D-Dimer Recent Labs    02/24/19 2212  DDIMER 1.52*   Hgb A1c No results for input(s): HGBA1C in the last 72 hours. Lipid Profile Recent Labs    02/25/19 0528  CHOL 133  HDL 61  LDLCALC 57  TRIG 74  CHOLHDL 2.2   Thyroid function studies No results for input(s): TSH, T4TOTAL, T3FREE, THYROIDAB in the last 72 hours.  Invalid input(s): FREET3 Anemia work up No results for input(s): VITAMINB12, FOLATE, FERRITIN, TIBC, IRON, RETICCTPCT in the last 72 hours. Urinalysis    Component Value Date/Time   COLORURINE YELLOW 03/11/2018 1819   APPEARANCEUR CLEAR 03/11/2018 1819   LABSPEC 1.034 (H) 03/11/2018 1819   PHURINE 6.0 03/11/2018 1819   GLUCOSEU NEGATIVE 03/11/2018 1819   HGBUR NEGATIVE 03/11/2018 1819    BILIRUBINUR NEGATIVE 03/11/2018 1819   BILIRUBINUR negative 10/14/2017 1020   KETONESUR 5 (A) 03/11/2018 1819   PROTEINUR NEGATIVE 03/11/2018 1819   UROBILINOGEN 0.2 10/14/2017 1020   NITRITE NEGATIVE 03/11/2018 1819   LEUKOCYTESUR NEGATIVE 03/11/2018 1819   Sepsis Labs Invalid input(s): PROCALCITONIN,  WBC,  LACTICIDVEN Microbiology Recent Results (from the past 240 hour(s))  SARS Coronavirus 2 [facility] order, Performed in [facility] hospital lab) Nasopharyngeal Nasopharyngeal Swab     Status: None   Collection Time: 02/25/19 12:04 AM   Specimen: Nasopharyngeal Swab  Result Value Ref Range Status   SARS Coronavirus 2 NEGATIVE NEGATIVE Final    Comment: (NOTE) If result is NEGATIVE SARS-CoV-2 target nucleic acids  are NOT DETECTED. The SARS-CoV-2 RNA is generally detectable in upper and lower  respiratory specimens during the acute phase of infection. The lowest  concentration of SARS-CoV-2 viral copies this assay can detect is 250  copies / mL. A negative result does not preclude SARS-CoV-2 infection  and should not be used as the sole basis for treatment or other  patient management decisions.  A negative result may occur with  improper specimen collection / handling, submission of specimen other  than nasopharyngeal swab, presence of viral mutation(s) within the  areas targeted by this assay, and inadequate number of viral copies  (<250 copies / mL). A negative result must be combined with clinical  observations, patient history, and epidemiological information. If result is POSITIVE SARS-CoV-2 target nucleic acids are DETECTED. The SARS-CoV-2 RNA is generally detectable in upper and lower  respiratory specimens dur ing the acute phase of infection.  Positive  results are indicative of active infection with SARS-CoV-2.  Clinical  correlation with patient history and other diagnostic information is  necessary to determine patient infection status.  Positive results do  not  rule out bacterial infection or co-infection with other viruses. If result is PRESUMPTIVE POSTIVE SARS-CoV-2 nucleic acids MAY BE PRESENT.   A presumptive positive result was obtained on the submitted specimen  and confirmed on repeat testing.  While 2019 novel coronavirus  (SARS-CoV-2) nucleic acids may be present in the submitted sample  additional confirmatory testing may be necessary for epidemiological  and / or clinical management purposes  to differentiate between  SARS-CoV-2 and other Sarbecovirus currently known to infect humans.  If clinically indicated additional testing with an alternate test  methodology [phone]) is advised. The SARS-CoV-2 RNA is generally  detectable in upper and lower respiratory sp ecimens during the acute  phase of infection. The expected result is Negative. Fact Sheet for Patients:  StrictlyIdeas.no Fact Sheet for Healthcare Providers: BankingDealers.co.za This test is not yet approved or cleared by the [place] FDA and has been authorized for detection and/or diagnosis of SARS-CoV-2 by FDA under an Emergency Use Authorization (EUA).  This EUA will remain in effect (meaning this test can be used) for the duration of the COVID-19 declaration under Section 564(b)(1) of the Act, 21 U.S.C. section 360bbb-3(b)(1), unless the authorization is terminated or revoked sooner. Performed at [facility], [place] [address], [place], [place] [address]    Time spent: 39min  SIGNED:   Marylu Lund, MD  [facility] 02/25/2019, 2:44 PM  If 7PM-7AM, please contact night-coverage

## 2019-02-25 NOTE — Care Management CC44 (Signed)
Condition Code 44 Documentation Completed  Patient Details  Name: Joy Williams MRN: [id number] Date of Birth: [birth year]   Condition Code 44 given:  Yes Patient signature on Condition Code 44 notice:  Yes Documentation of 2 MD's agreement:  Yes Code 44 added to claim:  Yes    Dawayne Patricia, RN 02/25/2019, 3:33 PM

## 2019-02-25 NOTE — H&P (Signed)
History and Physical    Joy Williams YIR:[id number] DOB: 02/18/1951 DOA: 02/24/2019  PCP: Mellody Dance, DO  Patient coming from: Home.  Chief Complaint: Chest pain.  HPI: Joy Williams is a 68 y.o. female with history of bioprosthetic mitral valve replacement in 2018, COPD with ongoing tobacco abuse, hypothyroidism presents to the ER with complaints of chest pain.  Patient has been having chest pressure since yesterday morning.  Present even at rest.  Pressure is mostly retrosternal.  Has no relation to exertion.  Denies any associated fever chills productive cough nausea vomiting or diarrhea.  Given the symptoms patient presented to the ER later in the evening.  ED Course: In the ER EKG shows normal sinus rhythm with nonspecific ST-T changes LVH findings.  CT angiogram of the chest was negative for PE.  Labs show high-sensitivity troponin of 1400 and 1600.  On-call cardiologist was notified.  Patient was started on nitroglycerin given the substernal chest pressure positive troponin and was started on heparin.  Was given aspirin.  Admitted for possible non-ST relation MI.  Review of Systems: As per HPI, rest all negative.   Past Medical History:  Diagnosis Date  . Allergy   . Asthma    pt denies this, but is on Dulera  . Chronic diastolic congestive heart failure ([place])   . 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 COPD (chronic obstructive pulmonary disease) ([place])   . Family history of adverse reaction to anesthesia    Mother- very sensitive to medication  . Heart murmur    MVP  . Hypothyroidism   . 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 MVP (mitral valve prolapse)   . PONV (postoperative nausea and vomiting)   . Raynaud's disease    Raynaud's  . Raynaud's syndrome   . 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 Severe mitral regurgitation 11/15/2014  . Shortness of breath dyspnea    with exertion  . STD (sexually transmitted disease)   . Telangiectasia   . Thyroid disease     Past Surgical History:  Procedure Laterality Date  . BACK SURGERY     x 3  Disectomy  . BREAST BIOPSY Left   . CARDIAC CATHETERIZATION N/A 03/08/2016   Procedure: Right/Left Heart Cath and Coronary Angiography;  Surgeon: Sherren Mocha, MD;  Location: [place] [facility];  Service: Cardiovascular;  Laterality: N/A;  . CLAVICLE SURGERY Left 2013   plate to left collar bone  . LAPAROSCOPY     ? reason-age 47   . LUNG CANCER SURGERY    . MITRAL VALVE REPAIR Right 06/27/2016   Procedure: MINIMALLY INVASIVE MITRAL VALVE REPLACEMENT;  Surgeon: Rexene Alberts, MD;  Location: [place];  Service: Open Heart Surgery;  Laterality: Right;  . TEE WITHOUT CARDIOVERSION N/A 02/22/2016   Procedure: TRANSESOPHAGEAL ECHOCARDIOGRAM (TEE);  Surgeon: Dorothy Spark, MD;  Location: [place];  Service: Cardiovascular;  Laterality: N/A;  . TEE WITHOUT CARDIOVERSION N/A 06/27/2016   Procedure: TRANSESOPHAGEAL ECHOCARDIOGRAM (TEE);  Surgeon: Rexene Alberts, MD;  Location: [place];  Service: Open Heart Surgery;  Laterality: N/A;  .  TONSILLECTOMY    . VIDEO ASSISTED THORACOSCOPY (VATS)/WEDGE RESECTION Left 12/30/2014   Bronchoscopy, Mediastinoscopy, Left VATS for Wedge resection LUL x2 adn LLL x1 - Dr. Elenor Quinones at [facility]  . VOCAL CORD INJECTION Left 2017   injected with botox  . wrist surgery Left 2015   plate to wrist      reports that she has been smoking e-cigarettes and cigarettes. She has a 6.00 pack-year smoking history. She has never used smokeless tobacco. She reports current alcohol use of about 2.0 standard drinks  of alcohol per week. She reports that she does not use drugs.  Allergies  Allergen Reactions  . Penicillins Hives    Unknown, occurred as a child Has patient had a PCN reaction causing immediate rash, facial/tongue/throat swelling, SOB or lightheadedness with hypotension: Yes Has patient had a PCN reaction causing severe rash involving mucus membranes or skin necrosis: No Has patient had a PCN reaction that required hospitalization  Has patient had a PCN reaction occurring within the last 10 years: No If all of the above answers are "NO", then may proceed with Cephalosporin use. No    Family History  Problem Relation Age of Onset  . Mitral valve prolapse Mother   . Dementia Father   . Prostate cancer Father   . Mitral valve prolapse Brother   . Mitral valve prolapse Sister   . Colon cancer Neg Hx   . Colon polyps Neg Hx   . Rectal cancer Neg Hx   . Esophageal cancer Neg Hx   . Stomach cancer Neg Hx   . Heart attack Neg Hx     Prior to Admission medications   Medication Sig Start Date End Date Taking? Authorizing Provider  acetaminophen (TYLENOL) 500 MG tablet Take 1,000 mg by mouth every 6 (six) hours as needed for mild pain or headache.    [provider]  atorvastatin (LIPITOR) 20 MG tablet Take 1 tablet (20 mg total) by mouth daily at 6 PM. 10/22/18   Dorothy Spark, MD  azelastine (ASTELIN) 0.1 % nasal spray Place 2 sprays into both nostrils 2 (two) times daily. Use in each nostril as directed 11/05/18   Mellody Dance, DO  Cholecalciferol (VITAMIN D3) 5000 units TABS 5,000 IU OTC vitamin D3 daily. 01/22/17   Mellody Dance, DO  clopidogrel (PLAVIX) 75 MG tablet Take 1 tablet (75 mg total) by mouth daily. 09/15/18   Frann Rider, NP  fluticasone (FLONASE) 50 MCG/ACT nasal spray 1 SPRAY EACH NOSTRIL FOLLOWING SINUS RINSES TWICE DAILY 01/28/19   Opalski, Neoma Laming, DO  Fluticasone-Umeclidin-Vilant (TRELEGY ELLIPTA) 100-62.5-25 MCG/INH AEPB Inhale 1 puff into the lungs  daily. 07/09/18   Tanda Rockers, MD  levothyroxine (SYNTHROID) 175 MCG tablet Take 1 tablet (175 mcg total) by mouth daily before breakfast. 01/06/19   Opalski, Neoma Laming, DO  Multiple Vitamins-Minerals (MULTIVITAMIN WITH MINERALS) tablet Take 1 tablet by mouth daily.    [provider]  naproxen sodium (ALEVE) 220 MG tablet Take 220 mg by mouth as needed (pain).    [provider]  Turmeric 500 MG CAPS Take 500 mg by mouth daily.    [provider]  Vitamin D, Ergocalciferol, (DRISDOL) 1.25 MG (50000 UT) CAPS capsule Take one tablet wkly 11/16/18   Mellody Dance, DO    Physical Exam: Constitutional: Moderately built and nourished. Vitals:   02/25/19 0315 02/25/19 0330 02/25/19 0345 02/25/19 0415  BP: 101/81 (!) 109/50 102/65 106/69  Pulse: 65 64 66 66  Resp: 20 19  15 16  Temp:      SpO2: 100% 98% 98% 100%  Weight:      Height:       Eyes: Anicteric no pallor. ENMT: No discharge from the ears eyes nose and mouth. Neck: No mass felt.  No neck rigidity. Respiratory: No rhonchi or crepitations. Cardiovascular: S1-S2 heard. Abdomen: Soft nontender bowel sounds present. Musculoskeletal: No edema. Skin: No rash. Neurologic: Alert awake oriented to time place and person.  Moves all extremities. Psychiatric: Appears normal per normal affect.   Labs on Admission: I have personally reviewed following labs and imaging studies  CBC: Recent Labs  Lab 02/24/19 2212  WBC 7.1  NEUTROABS 4.8  HGB 13.3  HCT 41.2  MCV 90.2  PLT 353   Basic Metabolic Panel: Recent Labs  Lab 02/24/19 2212  NA 133*  K 4.3  CL 98  CO2 24  GLUCOSE 85  BUN 10  CREATININE 0.69  CALCIUM 9.5   GFR: Estimated Creatinine Clearance: 63.1 mL/min (by C-G formula based on SCr of 0.69 mg/dL). Liver Function Tests: Recent Labs  Lab 02/24/19 2212  AST 47*  ALT 31  ALKPHOS 101  BILITOT 0.9  PROT 7.5  ALBUMIN 4.3   No results for input(s): LIPASE, AMYLASE in the last 168  hours. No results for input(s): AMMONIA in the last 168 hours. Coagulation Profile: Recent Labs  Lab 02/24/19 2337  INR 1.0   Cardiac Enzymes: No results for input(s): CKTOTAL, CKMB, CKMBINDEX, TROPONINI in the last 168 hours. BNP (last 3 results) No results for input(s): PROBNP in the last 8760 hours. HbA1C: No results for input(s): HGBA1C in the last 72 hours. CBG: No results for input(s): GLUCAP in the last 168 hours. Lipid Profile: No results for input(s): CHOL, HDL, LDLCALC, TRIG, CHOLHDL, LDLDIRECT in the last 72 hours. Thyroid Function Tests: No results for input(s): TSH, T4TOTAL, FREET4, T3FREE, THYROIDAB in the last 72 hours. Anemia Panel: No results for input(s): VITAMINB12, FOLATE, FERRITIN, TIBC, IRON, RETICCTPCT in the last 72 hours. Urine analysis:    Component Value Date/Time   COLORURINE YELLOW 03/11/2018 1819   APPEARANCEUR CLEAR 03/11/2018 1819   LABSPEC 1.034 (H) 03/11/2018 1819   PHURINE 6.0 03/11/2018 1819   GLUCOSEU NEGATIVE 03/11/2018 1819   HGBUR NEGATIVE 03/11/2018 1819   BILIRUBINUR NEGATIVE 03/11/2018 1819   BILIRUBINUR negative 10/14/2017 1020   KETONESUR 5 (A) 03/11/2018 1819   PROTEINUR NEGATIVE 03/11/2018 1819   UROBILINOGEN 0.2 10/14/2017 1020   NITRITE NEGATIVE 03/11/2018 1819   LEUKOCYTESUR NEGATIVE 03/11/2018 1819   Sepsis Labs: @LABRCNTIP (procalcitonin:4,lacticidven:4) ) Recent Results (from the past 240 hour(s))  SARS Coronavirus 2 [facility] order, Performed in [facility] [place] hospital lab) Nasopharyngeal Nasopharyngeal Swab     Status: None   Collection Time: 02/25/19 12:04 AM   Specimen: Nasopharyngeal Swab  Result Value Ref Range Status   SARS Coronavirus 2 NEGATIVE NEGATIVE Final    Comment: (NOTE) If result is NEGATIVE SARS-CoV-2 target nucleic acids are NOT DETECTED. The SARS-CoV-2 RNA is generally detectable in upper and lower  respiratory specimens during the acute phase of infection. The lowest  concentration of SARS-CoV-2  viral copies this assay can detect is 250  copies / mL. A negative result does not preclude SARS-CoV-2 infection  and should not be used as the sole basis for treatment or other  patient management decisions.  A negative result may occur with  improper specimen collection / handling, submission of specimen other  than nasopharyngeal swab, presence of viral mutation(s)  within the  areas targeted by this assay, and inadequate number of viral copies  (<250 copies / mL). A negative result must be combined with clinical  observations, patient history, and epidemiological information. If result is POSITIVE SARS-CoV-2 target nucleic acids are DETECTED. The SARS-CoV-2 RNA is generally detectable in upper and lower  respiratory specimens dur ing the acute phase of infection.  Positive  results are indicative of active infection with SARS-CoV-2.  Clinical  correlation with patient history and other diagnostic information is  necessary to determine patient infection status.  Positive results do  not rule out bacterial infection or co-infection with other viruses. If result is PRESUMPTIVE POSTIVE SARS-CoV-2 nucleic acids MAY BE PRESENT.   A presumptive positive result was obtained on the submitted specimen  and confirmed on repeat testing.  While 2019 novel coronavirus  (SARS-CoV-2) nucleic acids may be present in the submitted sample  additional confirmatory testing may be necessary for epidemiological  and / or clinical management purposes  to differentiate between  SARS-CoV-2 and other Sarbecovirus currently known to infect humans.  If clinically indicated additional testing with an alternate test  methodology [phone]) is advised. The SARS-CoV-2 RNA is generally  detectable in upper and lower respiratory sp ecimens during the acute  phase of infection. The expected result is Negative. Fact Sheet for Patients:  StrictlyIdeas.no Fact Sheet for Healthcare Providers:  BankingDealers.co.za This test is not yet approved or cleared by the [place] FDA and has been authorized for detection and/or diagnosis of SARS-CoV-2 by FDA under an Emergency Use Authorization (EUA).  This EUA will remain in effect (meaning this test can be used) for the duration of the COVID-19 declaration under Section 564(b)(1) of the Act, 21 U.S.C. section 360bbb-3(b)(1), unless the authorization is terminated or revoked sooner. Performed at [facility], [place] [address] [place], [place] [address]      Radiological Exams on Admission: Dg Chest 2 View  Result Date: 02/24/2019 CLINICAL DATA:  Chest pressure and pain EXAM: CHEST - 2 VIEW COMPARISON:  05/25/2017 FINDINGS: Cardiac shadows within normal limits. Postsurgical changes are seen. The lungs are well aerated bilaterally. Chronic fibrotic changes are noted in the left base. No sizable infiltrate or effusion is seen. No bony abnormality is noted. Prior fixation of the left clavicle is noted. IMPRESSION: Chronic changes without acute abnormality. Electronically Signed   By: Inez Catalina M.D.   On: 02/24/2019 21:40   Ct Angio Chest Pe W And/or Wo Contrast  Result Date: 02/25/2019 CLINICAL DATA:  Chest pressure, history of lung cancer and mitral valve replacement EXAM: CT ANGIOGRAPHY CHEST WITH CONTRAST TECHNIQUE: Multidetector CT imaging of the chest was performed using the standard protocol during bolus administration of intravenous contrast. Multiplanar CT image reconstructions and MIPs were obtained to evaluate the vascular anatomy. CONTRAST:  17mL OMNIPAQUE IOHEXOL 350 MG/ML SOLN COMPARISON:  May 01, 2016 FINDINGS: Cardiovascular: There is a optimal opacification of the pulmonary arteries. There is no central,segmental, or subsegmental filling defects within the pulmonary arteries. There is mild cardiomegaly. Prosthetic mitral valve is seen. There is normal three-vessel brachiocephalic anatomy  without proximal stenosis. Mild aortic atherosclerosis is seen. Mediastinum/Nodes: No hilar, mediastinal, or axillary adenopathy. Scattered small pretracheal lymph nodes are noted. He thyroid gland, trachea, and esophagus demonstrate no significant findings. Lungs/Pleura: Again seen is surgical suture is in the left upper lung and left lower lobe. There are stable areas of scarring seen within the left upper lung. Hazy/patchy airspace opacity seen in the  posterior peripheral lower lung as on the prior exam with interstitial thickening. No new areas of consolidation. No pleural effusion. Upper Abdomen: No acute abnormalities present in the visualized portions of the upper abdomen. Musculoskeletal: No chest wall abnormality. No acute or significant osseous findings. Prior plate fixation of a healed left clavicular fracture seen. Review of the MIP images confirms the above findings. IMPRESSION: No central, segmental, or subsegmental pulmonary embolism. Prosthetic mitral valve. Stable areas of scarring in both lung apices and posterior lower lobes. No acute intrathoracic pathology. Electronically Signed   By: Prudencio Pair M.D.   On: 02/25/2019 01:43    EKG: Independently reviewed.  Normal sinus rhythm with nonspecific T changes with LVH findings.  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 Chest pain concerning for non-ST relation MI given the elevated troponin -cardiologist on-call has been notified.  Patient is on heparin drip nitroglycerin infusion and aspirin.  Not on beta-blocker since patient's blood pressure is in the low normal.  Check lipid panel  will need to be on statins. 2. History of bioprosthetic mitral valve replacement -check 2D echo.  Cardiology notified. 3. Chronic diastolic CHF appears compensated. 4. COPD -no active wheezing at this time. 5. Hypothyroidism on Synthroid. 6. History of TIA. 7. Tobacco abuse -advised about quitting.  Given that patient has ongoing chest pain with nitroglycerin for heparin and a positive troponin patient will need more than 2 midnight stay in inpatient status.   DVT prophylaxis: Heparin. Code Status: Full code. Family Communication: Family at the bedside. Disposition Plan: Home. Consults called: Cardiology. Admission status: Inpatient.   Rise Patience MD [facility] Pager [phone].  If 7PM-7AM, please contact night-coverage [email or web address] Password [email or web address]  02/25/2019, 4:57 AM

## 2019-02-25 NOTE — ED Notes (Signed)
Contacts:  Ndia Sampath (daughter)   [phone]                   Caly Pellum (son)  [phone]

## 2019-02-25 NOTE — Progress Notes (Signed)
Ordered outpatient cardiac MRI for further evaluation of chest pain per Dr. Debara Pickett. High-sensitivity troponin peaked in the 1600's but cardiac cath showed normal coronaries. Ruling out myocarditis.

## 2019-02-25 NOTE — ED Notes (Signed)
ECHO remains in room at this time for eval.

## 2019-02-25 NOTE — Progress Notes (Signed)
Dr. Sherrian Divers confirmed that Dr. Debara Pickett  Wants pt to go home.  Pt doing well with clean dry TRB site.

## 2019-02-25 NOTE — ED Notes (Signed)
Pt alert and oriented with no distress, discussing cath procedure with cardiac PA.  Will DC Nitro at this time d/t BP and continue to monitor.

## 2019-02-25 NOTE — ED Notes (Signed)
Pt denies any current CP, dyspnea, dizziness or any sx.   Alert and appropriate without any noted distress.   Requests to ambulate to BR as she had done successfully through the [facility].  No concerns or questions at this time.

## 2019-02-25 NOTE — Interval H&P Note (Signed)
Cath Lab Visit (complete for each Cath Lab visit)  Clinical Evaluation Leading to the Procedure:   ACS: Yes.    Non-ACS:    Anginal Classification: CCS IV  Anti-ischemic medical therapy: Minimal Therapy (1 class of medications)  Non-Invasive Test Results: No non-invasive testing performed  Prior CABG: No previous CABG      History and Physical Interval Note:  02/25/2019 11:13 AM  Joy Williams  has presented today for surgery, with the diagnosis of nonstemi.  The various methods of treatment have been discussed with the patient and family. After consideration of risks, benefits and other options for treatment, the patient has consented to  Procedure(s): LEFT HEART CATH AND CORONARY ANGIOGRAPHY (N/A) as a surgical intervention.  The patient's history has been reviewed, patient examined, no change in status, stable for surgery.  I have reviewed the patient's chart and labs.  Questions were answered to the patient's satisfaction.     Larae Grooms

## 2019-02-25 NOTE — Progress Notes (Signed)
Progress Note  Patient Name: Joy Williams Date of Encounter: 02/25/2019  Primary Cardiologist: Ena Dawley, MD   Subjective   Patient admitted overnight with chest pain. Troponin peaked at 1,625. Pain significantly improved with Nitro. Only has very minimal dull discomfort at this time that she describes as a "lightness." No shortness of breath at this time.  Inpatient Medications    Scheduled Meds:  aspirin EC  81 mg Oral Daily   fluticasone furoate-vilanterol  1 puff Inhalation Daily   And   umeclidinium bromide  1 puff Inhalation Daily   levothyroxine  175 mcg Oral QAC breakfast   Continuous Infusions:  heparin 800 Units/hr (02/25/19 0655)   nitroGLYCERIN Stopped (02/25/19 0828)   PRN Meds: acetaminophen, ondansetron (ZOFRAN) IV   Vital Signs    Vitals:   02/25/19 0430 02/25/19 0648 02/25/19 0738 02/25/19 0822  BP: 115/64 122/65 123/78 (!) 105/58  Pulse: 64 64 66 63  Resp: 16 18 18 20   Temp:  (!) 97.5 F (36.4 C)    TempSrc:  Oral    SpO2: 98% 100% 100% 100%  Weight:      Height:        Intake/Output Summary (Last 24 hours) at 02/25/2019 0846 Last data filed at 02/25/2019 0828 Gross per 24 hour  Intake 20 ml  Output --  Net 20 ml   Last 3 Weights 02/24/2019 11/16/2018 10/22/2018  Weight (lbs) 130 lb 15.3 oz 131 lb 131 lb  Weight (kg) 59.4 kg 59.421 kg 59.421 kg      Telemetry    Normal sinus rhythm with rates in the 60's to 70's and PVCs. - Personally Reviewed  ECG    Normal sinus rhythm, rate 67 bpm, with LVH and ST elevation in anterior leads consistent with early repolarization and borderline QTc prolongation of 489 ms. - Personally Reviewed  Physical Exam   GEN: No acute distress.   Neck: Supple. No JVD Cardiac: RRR. No murmurs, gallops, or rubs. Radial and distal pedal pulses 2+ and equal bilaterally. Respiratory: Clear to auscultation bilaterally.  GI: Soft, non-distended, and non-tender. Bowel sounds present. MS: No edema. No  deformity. Neuro:  No focal deficits.  Psych: Normal affect. Responds appropriately.  Labs    High Sensitivity Troponin:   Recent Labs  Lab 02/24/19 2212 02/25/19 0345 02/25/19 0528 02/25/19 0636  TROPONINIHS 1,471* 1,625* 1,246* 1,150*      Chemistry Recent Labs  Lab 02/24/19 2212  NA 133*  K 4.3  CL 98  CO2 24  GLUCOSE 85  BUN 10  CREATININE 0.69  CALCIUM 9.5  PROT 7.5  ALBUMIN 4.3  AST 47*  ALT 31  ALKPHOS 101  BILITOT 0.9  GFRNONAA >60  GFRAA >60  ANIONGAP 11     Hematology Recent Labs  Lab 02/24/19 2212 02/25/19 0528  WBC 7.1 7.7  RBC 4.57 4.27  HGB 13.3 12.1  HCT 41.2 38.2  MCV 90.2 89.5  MCH 29.1 28.3  MCHC 32.3 31.7  RDW 12.2 12.2  PLT 204 209    BNPNo results for input(s): BNP, PROBNP in the last 168 hours.   DDimer  Recent Labs  Lab 02/24/19 2212  DDIMER 1.52*     Radiology    Dg Chest 2 View  Result Date: 02/24/2019 CLINICAL DATA:  Chest pressure and pain EXAM: CHEST - 2 VIEW COMPARISON:  05/25/2017 FINDINGS: Cardiac shadows within normal limits. Postsurgical changes are seen. The lungs are well aerated bilaterally. Chronic fibrotic changes are noted in  the left base. No sizable infiltrate or effusion is seen. No bony abnormality is noted. Prior fixation of the left clavicle is noted. IMPRESSION: Chronic changes without acute abnormality. Electronically Signed   By: Inez Catalina M.D.   On: 02/24/2019 21:40   Ct Angio Chest Pe W And/or Wo Contrast  Result Date: 02/25/2019 CLINICAL DATA:  Chest pressure, history of lung cancer and mitral valve replacement EXAM: CT ANGIOGRAPHY CHEST WITH CONTRAST TECHNIQUE: Multidetector CT imaging of the chest was performed using the standard protocol during bolus administration of intravenous contrast. Multiplanar CT image reconstructions and MIPs were obtained to evaluate the vascular anatomy. CONTRAST:  64mL OMNIPAQUE IOHEXOL 350 MG/ML SOLN COMPARISON:  May 01, 2016 FINDINGS: Cardiovascular:  There is a optimal opacification of the pulmonary arteries. There is no central,segmental, or subsegmental filling defects within the pulmonary arteries. There is mild cardiomegaly. Prosthetic mitral valve is seen. There is normal three-vessel brachiocephalic anatomy without proximal stenosis. Mild aortic atherosclerosis is seen. Mediastinum/Nodes: No hilar, mediastinal, or axillary adenopathy. Scattered small pretracheal lymph nodes are noted. He thyroid gland, trachea, and esophagus demonstrate no significant findings. Lungs/Pleura: Again seen is surgical suture is in the left upper lung and left lower lobe. There are stable areas of scarring seen within the left upper lung. Hazy/patchy airspace opacity seen in the posterior peripheral lower lung as on the prior exam with interstitial thickening. No new areas of consolidation. No pleural effusion. Upper Abdomen: No acute abnormalities present in the visualized portions of the upper abdomen. Musculoskeletal: No chest wall abnormality. No acute or significant osseous findings. Prior plate fixation of a healed left clavicular fracture seen. Review of the MIP images confirms the above findings. IMPRESSION: No central, segmental, or subsegmental pulmonary embolism. Prosthetic mitral valve. Stable areas of scarring in both lung apices and posterior lower lobes. No acute intrathoracic pathology. Electronically Signed   By: Prudencio Pair M.D.   On: 02/25/2019 01:43    Cardiac Studies   Echocardiogram 11/12/2018: Impressions:  1. The left ventricle has normal systolic function with an ejection fraction of 60-65%. The cavity size was normal. There is mildly increased left ventricular wall thickness. Left ventricular diastolic Doppler parameters are indeterminate. No evidence  of left ventricular regional wall motion abnormalities.  2. The right ventricle has normal systolic function. The cavity was normal. There is no increase in right ventricular wall thickness.   3. Bioprosthetic mitral valve. No significant stenosis, mean gradient 2 mmHg. Trivial mitral regurgitation.  4. The aortic valve is tricuspid. No stenosis of the aortic valve.  5. The ascending aorta is normal in size and structure.  6. There is mild dilatation of the aortic root measuring 37 mm.  7. Left atrial size was moderately dilated.  8. Possible PFO by color doppler.  9. Normal IVC size with PA systolic pressure 31 mmHg. _______________  Carotid Ultrasounds 03/12/2018: Summary: - Right Carotid: Velocities in the right ICA are consistent with a 1-39% stenosis. - Left Carotid: Velocities in the left ICA are consistent with a 1-39% stenosis. - Vertebrals: Bilateral vertebral arteries demonstrate antegrade flow. _______________  Right/Left Heart Catheterization 03/08/2016: 1. Angiographically normal coronary arteries 2. Normal intracardiac pressures in both the right and left heart chambers with preserved cardiac output 3. Severely calcified mitral annulus with known bileaflet mitral valve prolapse and severe mitral regurgitation  Patient Profile   Joy Williams is a 68 y.o. 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 Hal Hope.   Assessment & Plan    NSTEMI - Patient admitted overnight for chest pain. Patient had normal coronaries on cath in 2017. - EKG normal sinus rhythm with LVH and early repolarization changes. - High-sensitivity troponin elevated at 1,471 >> 1,625 >>1,246 >> 1,150. - Currently on IV Heparin and IV Nitro. - Patient only reports very minimal pain at this time. - Continue IV Heparin.  -  BP borderline soft so can try off Nitro. - Continue aspirin and statin. LDL 57. Also on Plavix at home due to prior mini stroke. This was held on admission but would be OK to continue. - Will check hemoglobin A1c. - Will order Echo - Presentation somewhat atypical. Non-exertional but did improve with Nitro. Given clean coronaries 3 years ago, think significant blockage unlikely. Possibly stress cardiomyopathy or coronary spasms given Raynaud's. Plan for left heart catheterization later today. The patient understands that risks include but are not limited to stroke (1 in 1000), death (1 in 49), kidney failure [usually temporary] (1 in 500), bleeding (1 in 200), allergic reaction [possibly serious] (1 in 200), and agrees to proceed.   MVP with Mitral Regurgitation s/p Bioprosthetic MVR in 06/2016 - Most recent Echo from 10/2018 showed LVEF of 60-65% with trivial regurgitation and no stenosis of the bioprosthetic valve. - No murmur on exam. - Will monitor on repeat Echo.  Tobacco Use - Patient initially quit after lung cancer diagnosis in 2016 but started back again about 1 year later. Currently smoking about 1/2 pack per day.  - Emphasized the importance of complete cessation.  CVA - Patient reports mini stroke in 2019. - On Plavix.    For questions or updates, please contact [place] Please consult [email or web address] for contact info under        Signed, Darreld Mclean, PA-C  02/25/2019, 8:46 AM

## 2019-02-25 NOTE — Progress Notes (Addendum)
   I have personally reviewed the echo at bedside and cath films - normal wall motion, normal LVEF, no evidence for obstructive CAD, the bioprosthetic valve is normally functioning. The etiology of her troponin elevation is unclear, would consider this MINOCA or non-ACS troponin elevation rather than NSTEMI. Possibilities include myocarditis, ?coronary spasm (she has history of Raynaud's/scleroderma), cardioembolic event that resolved to the coronaries? Doubt Takatsubo without persistent WMA's. She is chest pain free. No arrythmias on telemetry. I discussed the case with her cardiologist Dr. Meda Coffee and she is ok with discharge but agreeable to outpatient cMRI to look for myocarditis next week and follow-up after. Joy Williams daughter is getting married on Saturday and she wishes to be at the wedding. I have recommended light activity and minimal alcohol use as per her questions and her daughter via phone conference. She was advised to look for any s/s of chest pain, palpitations, shortness of breath or unexplained syncope and contact us as needed.  Pixie Casino, MD, [facility], [place] Director of the [facility]  [facility] Diplomate of the American Board of Clinical Lipidology Attending Cardiologist  Direct Dial: [phone]  Fax: [phone]  Website:  [email or web address]

## 2019-02-25 NOTE — ED Notes (Signed)
Consents signed with procedure reviewed.  No current questions and pt ready for cath lab.  No recurrent CP or needs at this time.

## 2019-02-25 NOTE — Discharge Instructions (Signed)
Radial Site Care ° °This sheet gives you information about how to care for yourself after your procedure. Your health care provider may also give you more specific instructions. If you have problems or questions, contact your health care provider. °What can I expect after the procedure? °After the procedure, it is common to have: °· Bruising and tenderness at the catheter insertion area. °Follow these instructions at home: °Medicines °· Take over-the-counter and prescription medicines only as told by your health care provider. °Insertion site care °· Follow instructions from your health care provider about how to take care of your insertion site. Make sure you: °? Wash your hands with soap and water before you change your bandage (dressing). If soap and water are not available, use hand sanitizer. °? Change your dressing as told by your health care provider. °? Leave stitches (sutures), skin glue, or adhesive strips in place. These skin closures may need to stay in place for 2 weeks or longer. If adhesive strip edges start to loosen and curl up, you may trim the loose edges. Do not remove adhesive strips completely unless your health care provider tells you to do that. °· Check your insertion site every day for signs of infection. Check for: °? Redness, swelling, or pain. °? Fluid or blood. °? Pus or a bad smell. °? Warmth. °· Do not take baths, swim, or use a hot tub until your health care provider approves. °· You may shower 24-48 hours after the procedure, or as directed by your health care provider. °? Remove the dressing and gently wash the site with plain soap and water. °? Pat the area dry with a clean towel. °? Do not rub the site. That could cause bleeding. °· Do not apply powder or lotion to the site. °Activity ° °· For 24 hours after the procedure, or as directed by your health care provider: °? Do not flex or bend the affected arm. °? Do not push or pull heavy objects with the affected arm. °? Do not  drive yourself home from the hospital or clinic. You may drive 24 hours after the procedure unless your health care provider tells you not to. °? Do not operate machinery or power tools. °· Do not lift anything that is heavier than 10 lb (4.5 kg), or the limit that you are told, until your health care provider says that it is safe. °· Ask your health care provider when it is okay to: °? Return to work or school. °? Resume usual physical activities or sports. °? Resume sexual activity. °General instructions °· If the catheter site starts to bleed, raise your arm and put firm pressure on the site. If the bleeding does not stop, get help right away. This is a medical emergency. °· If you went home on the same day as your procedure, a responsible adult should be with you for the first 24 hours after you arrive home. °· Keep all follow-up visits as told by your health care provider. This is important. °Contact a health care provider if: °· You have a fever. °· You have redness, swelling, or yellow drainage around your insertion site. °Get help right away if: °· You have unusual pain at the radial site. °· The catheter insertion area swells very fast. °· The insertion area is bleeding, and the bleeding does not stop when you hold steady pressure on the area. °· Your arm or hand becomes pale, cool, tingly, or numb. °These symptoms may represent a serious problem   that is an emergency. Do not wait to see if the symptoms will go away. Get medical help right away. Call your local emergency services (911 in the [place] Do not drive yourself to the hospital. °Summary °· After the procedure, it is common to have bruising and tenderness at the site. °· Follow instructions from your health care provider about how to take care of your radial site wound. Check the wound every day for signs of infection. °· Do not lift anything that is heavier than 10 lb (4.5 kg), or the limit that you are told, until your health care provider says  that it is safe. °This information is not intended to replace advice given to you by your health care provider. Make sure you discuss any questions you have with your health care provider. °Document Released: 06/08/2010 Document Revised: 06/11/2017 Document Reviewed: 06/11/2017 °Elsevier Patient Education © 2020 [employer] ° °

## 2019-02-25 NOTE — Progress Notes (Signed)
ANTICOAGULATION CONSULT NOTE - Initial Consult  Pharmacy Consult for heparin Indication: chest pain/ACS  Allergies  Allergen Reactions  . Penicillins Hives    Unknown, occurred as a child Has patient had a PCN reaction causing immediate rash, facial/tongue/throat swelling, SOB or lightheadedness with hypotension: Yes Has patient had a PCN reaction causing severe rash involving mucus membranes or skin necrosis: No Has patient had a PCN reaction that required hospitalization  Has patient had a PCN reaction occurring within the last 10 years: No If all of the above answers are "NO", then may proceed with Cephalosporin use. No    Patient Measurements: Height: 5\' 8"  (172.7 cm) Weight: 130 lb 15.3 oz (59.4 kg) IBW/kg (Calculated) : 63.9  Vital Signs: Temp: 97.9 F (36.6 C) (10/07 2052) BP: 115/64 (10/08 0430) Pulse Rate: 64 (10/08 0430)  Labs: Recent Labs    02/24/19 2212 02/24/19 2337 02/25/19 0345 02/25/19 0528  HGB 13.3  --   --  12.1  HCT 41.2  --   --  38.2  PLT 204  --   --  209  APTT  --  32  --   --   LABPROT  --  13.2  --   --   INR  --  1.0  --   --   HEPARINUNFRC  --   --   --  0.28*  CREATININE 0.69  --   --   --   TROPONINIHS 1,471*  --  1,625*  --     Estimated Creatinine Clearance: 63.1 mL/min (by C-G formula based on SCr of 0.69 mg/dL).  Assessment: CC/HPI: 68 yo f presenting with sudden chest pressure  PMH: MVR, dCHF, COPD, lung cancer (remission)  Anticoag: none pta - iv hep for r/o ACS.  Initial Hep Lvl slightly low 0.28  CV: trop 1471>>1625   Renal: SCr 0.69  Heme/Onc: H&H 13.3/41.2, Plt 204  Goal of Therapy:  Heparin level 0.3-0.7 units/ml Monitor platelets by anticoagulation protocol: Yes   Plan:  Increase heparin gtt to 800 units/hr Next lvl 1300 Daily HL CBC F/U cards plans  Levester Fresh, PharmD, BCPS, BCCCP Clinical Pharmacist [phone]  Please check AMION for all [place] numbers  02/25/2019 6:08 AM

## 2019-02-26 ENCOUNTER — Encounter (HOSPITAL_COMMUNITY): Payer: Self-pay | Admitting: Interventional Cardiology

## 2019-03-01 ENCOUNTER — Other Ambulatory Visit: Payer: Self-pay

## 2019-03-01 ENCOUNTER — Ambulatory Visit: Payer: Medicare Other | Admitting: Student

## 2019-03-01 ENCOUNTER — Encounter: Payer: Self-pay | Admitting: Physician Assistant

## 2019-03-01 ENCOUNTER — Ambulatory Visit (INDEPENDENT_AMBULATORY_CARE_PROVIDER_SITE_OTHER): Payer: Medicare Other | Admitting: Physician Assistant

## 2019-03-01 VITALS — BP 118/64 | HR 63 | Ht 66.0 in | Wt 132.4 lb

## 2019-03-01 DIAGNOSIS — I340 Nonrheumatic mitral (valve) insufficiency: Secondary | ICD-10-CM

## 2019-03-01 DIAGNOSIS — I214 Non-ST elevation (NSTEMI) myocardial infarction: Secondary | ICD-10-CM | POA: Diagnosis not present

## 2019-03-01 DIAGNOSIS — Z953 Presence of xenogenic heart valve: Secondary | ICD-10-CM

## 2019-03-01 DIAGNOSIS — E785 Hyperlipidemia, unspecified: Secondary | ICD-10-CM

## 2019-03-01 DIAGNOSIS — I6523 Occlusion and stenosis of bilateral carotid arteries: Secondary | ICD-10-CM | POA: Diagnosis not present

## 2019-03-01 MED ORDER — NITROGLYCERIN 0.4 MG SL SUBL: 0.4000 mg | SUBLINGUAL_TABLET | SUBLINGUAL | 3 refills | Status: DC | PRN

## 2019-03-01 NOTE — Progress Notes (Signed)
Cardiology Office Note:    Date:  03/01/2019   ID:  Joy Williams, DOB February 22, 1951, MRN [id number]  PCP:  Mellody Dance, DO  Cardiologist:  Ena Dawley, MD   Electrophysiologist:  None   Referring MD: Mellody Dance, DO   Chief Complaint  Patient presents with   Hospitalization Follow-up    elevated troponin    History of Present Illness:    Joy Williams is a 68 y.o. female with:   MVP with mitral regurgitation   S/p min invasive bioprosthetic MVR in 06/2016 (Dr. Roxy Manns)  Cath prior to MVR without CAD  Carotid artery Dz  [place] 2018: bilat ICA 40-59  [place] 10/19: bilat ICA 0-93  Diastolic CHF  Hx of TIA  Lung CA (2 primary tumors)  S/p resection; adjuvant chemoTx; managed at [facility]  Asthmatic bronchitis   Raynaud's Disease  Pt notes ?hx of CREST but ultimately ruled out  Hypothyroidism   Tobacco use  Joy Williams was last seen by Dr. Meda Coffee in 10/2018 via Telemedicine.  She was recently admitted 10/7-10/8 with chest discomfort.  High-sensitivity troponin levels were elevated (1471 >> 1625 >> 1246 >> 1150).  There was concern for non-ST elevation myocardial infarction.  However, cardiac catheterization demonstrated no CAD.  Echocardiogram demonstrated normal LV function and normally functioning mitral valve prosthesis.  She was followed by Dr. Debara Pickett in the hospital and he felt that her presentation was c/w [facility] (myocardial infarction with non-obstructive coronary arteries) or non-ACS troponin elevation.  The possible causes included myocarditis, coronary spasm, cardioembolic event.  Recommendation was to proceed with cardiac MRI and follow-up thereafter.  However, she has not yet had a cardiac MRI.  She is here today for follow up.  She is here alone.  Her daughter was married yesterday and everything went well.  She has done well since DC without further chest pain. She has not had significant shortness of breath.  She has not had leg swelling, orthopnea, syncope.   Her R wrist is well healed.    Prior CV studies:   The following studies were reviewed today:  Cardiac catheterization 02/25/2019 Normal coronary arteries EF 55-65   Echocardiogram 02/25/2019 EF 60-65, mildly increased LV posterior wall thickness, normal RV SF, mild to moderate LAE, MVR with trace MR and no evidence of mitral stenosis, trivial TR, trivial PI  Echo 11/12/2018 EF 60-65, mild LVH, normal wall motion, normal RVSF, bioprosthetic mitral valve with trivial MR and no significant stenosis (mean gradient 10 mmHg), mild dilation of aortic root (37 mm), moderate LAE, possible PFO by color Doppler  Carotid US 03/13/2018 Summary: Right Carotid: Velocities in the right ICA are consistent with a 1-39% stenosis. Left Carotid: Velocities in the left ICA are consistent with a 1-39% stenosis. Vertebrals: Bilateral vertebral arteries demonstrate antegrade flow.  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 [facility] (had to have botox injection)   COPD (chronic obstructive pulmonary disease) ([place])    Family history of adverse reaction to anesthesia    Mother- very sensitive to medication   Heart murmur  MVP   Hypothyroidism    Lung cancer ([place]) 12/30/2014   Synchronous primaries:  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, each treated by wedge resection with post-op adjuvant chemoRx at [facility]   MVP (mitral valve prolapse)    PONV (postoperative nausea and vomiting)    Raynaud's disease    Raynaud's   Raynaud's syndrome    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 06/27/2016   31 mm [facility] mitral bovine bioprosthetic tissue valve placed via right mini thoracotomy approach   Severe mitral regurgitation 11/15/2014   Shortness of breath dyspnea    with exertion   STD (sexually transmitted disease)    Telangiectasia    Thyroid disease    Surgical Hx: The patient  has a past surgical history that includes Back surgery; Clavicle surgery (Left, 2013); wrist surgery (Left, 2015); Tonsillectomy; laparoscopy; TEE without cardioversion (N/A, 02/22/2016); Video assisted thoracoscopy (vats)/wedge resection (Left, 12/30/2014); Cardiac catheterization (N/A, 03/08/2016); Mitral valve repair (Right, 06/27/2016); TEE without cardioversion (N/A, 06/27/2016); Lung cancer surgery; Vocal cord injection (Left, 2017); Breast biopsy (Left); and LEFT HEART CATH AND CORONARY ANGIOGRAPHY (N/A, 02/25/2019).   Current Medications: Current Meds  Medication Sig   acetaminophen (TYLENOL) 500 MG tablet Take 1,000 mg by mouth every 6 (six) hours as needed for mild pain or headache.   atorvastatin (LIPITOR) 20 MG tablet Take 1 tablet (20 mg total) by mouth daily at 6 PM.   azelastine (ASTELIN) 0.1 % nasal spray Place 2 sprays into both nostrils 2 (two) times daily. Use in each nostril as directed   Cholecalciferol (VITAMIN D3) 5000 units TABS 5,000 IU OTC vitamin D3 daily. (Patient taking differently: Take 5,000 Units by mouth daily. )   clopidogrel (PLAVIX) 75 MG tablet Take 1 tablet (75 mg total) by mouth daily.   fluticasone (FLONASE) 50 MCG/ACT nasal spray 1 SPRAY EACH NOSTRIL FOLLOWING SINUS RINSES TWICE DAILY (Patient taking differently: Place 1 spray into both nostrils 2 (two) times daily. )   Fluticasone-Umeclidin-Vilant (TRELEGY ELLIPTA) 100-62.5-25 MCG/INH AEPB Inhale 1 puff into the lungs daily.   ibuprofen (ADVIL) 200 MG tablet Take 400 mg by mouth every 6 (six) hours as needed for moderate pain.   levothyroxine  (SYNTHROID) 175 MCG tablet Take 1 tablet (175 mcg total) by mouth daily before breakfast.   Multiple Vitamins-Minerals (MULTIVITAMIN WITH MINERALS) tablet Take 1 tablet by mouth daily.   naproxen sodium (ALEVE) 220 MG tablet Take 220 mg by mouth as needed (pain).   Turmeric 500 MG CAPS Take 500 mg by mouth daily.     Allergies:   Penicillins   Social History   Tobacco Use   Smoking status: Current Every Day Smoker    Packs/day: 0.25    Years: 24.00    Pack years: 6.00    Types: E-cigarettes, Cigarettes   Smokeless tobacco: Never Used  Substance Use Topics   Alcohol use: Yes    Alcohol/week: 2.0 standard drinks    Types: 1 Shots of liquor, 1 Standard drinks or equivalent per week   Drug use: No     Family Hx: The patient's family history includes Dementia in her father; Mitral valve prolapse in her brother, mother, and sister; Prostate cancer in her father. There is no history of Colon cancer, Colon polyps, Rectal cancer, Esophageal cancer, Stomach cancer, or Heart attack.  ROS:   Please see the history of present illness.    ROS All other systems reviewed and are  negative.   EKGs/Labs/Other Test Reviewed:    EKG:  EKG is  ordered today.  The ekg ordered today demonstrates normal sinus rhythm, HR 63, leftward axis, non-specific ST-TW changes, QTc 476  Recent Labs: 11/11/2018: TSH 1.790 02/24/2019: ALT 31; BUN 10; Creatinine, Ser 0.69; Potassium 4.3; Sodium 133 02/25/2019: Hemoglobin 12.1; Platelets 209   Recent Lipid Panel Lab Results  Component Value Date/Time   CHOL 133 02/25/2019 05:28 AM   CHOL 150 11/11/2018 09:46 AM   CHOL 177 10/28/2014 10:25 AM   TRIG 74 02/25/2019 05:28 AM   TRIG 115 10/28/2014 10:25 AM   HDL 61 02/25/2019 05:28 AM   HDL 70 11/11/2018 09:46 AM   HDL 70 10/28/2014 10:25 AM   CHOLHDL 2.2 02/25/2019 05:28 AM   LDLCALC 57 02/25/2019 05:28 AM   LDLCALC 64 11/11/2018 09:46 AM   LDLCALC 84 10/28/2014 10:25 AM   LDLDIRECT 68 11/11/2018  09:46 AM    Physical Exam:    VS:  BP 118/64    Pulse 63    Ht 5\' 6"  (1.676 m)    Wt 132 lb 6.4 oz (60.1 kg)    SpO2 95%    BMI 21.37 kg/m     Wt Readings from Last 3 Encounters:  03/01/19 132 lb 6.4 oz (60.1 kg)  02/24/19 130 lb 15.3 oz (59.4 kg)  11/16/18 131 lb (59.4 kg)     Physical Exam  Constitutional: She is oriented to person, place, and time. She appears well-developed and well-nourished. No distress.  HENT:  Head: Normocephalic and atraumatic.  Eyes: No scleral icterus.  Neck: No JVD present. No thyromegaly present.  Cardiovascular: Normal rate, regular rhythm and normal heart sounds.  No murmur heard. Pulmonary/Chest: Effort normal and breath sounds normal. She has no rales.  Abdominal: Soft. There is no hepatomegaly.  Musculoskeletal:        General: No edema.     Comments: R wrist without hematoma  Lymphadenopathy:    She has no cervical adenopathy.  Neurological: She is alert and oriented to person, place, and time.  Skin: Skin is warm and dry.  Psychiatric: She has a normal mood and affect.    ASSESSMENT & PLAN:    1. NSTEMI (non-ST elevated myocardial infarction) [facility]) Patient is status post recent admission with substernal chest pain and elevated troponin levels.  However, there was no CAD on cardiac catheterization.  Possible etiologies include myopericarditis, coronary vasospasm or cardioembolic event.  Given her normal LV function, stress-induced cardiomyopathy is unlikely.  Recommendation has been to proceed with cardiac MRI.  This has not yet been done.  Apparently, we are still awaiting approval from her insurance.  I did discuss with Dr. Meda Coffee by telephone.  She will see the patient back in follow-up after her MRI.  Continue clopidogrel, atorvastatin.  I did give the patient a prescription for as needed nitroglycerin should her syndrome be related to vasospasm.  Her HR is in the low 60s and her BP is 785 systolic.  Therefore, beta-blocker therapy is not  started.    2. Mitral valve insufficiency, unspecified etiology 3. 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 Recent echocardiogram with normally functioning mitral valve prosthesis.  Continue SBE prophylaxis.  4. Bilateral carotid artery stenosis Bilateral ICA stenosis 1-39 by carotid US in October 2019.  Continue clopidogrel, statin therapy.  5. Hyperlipidemia LDL goal <70 LDL optimal on most recent lab work.  Continue current Rx.     Dispo:  Return for  Follow up after testing w/ Dr. Meda Coffee.   Medication Adjustments/Labs and Tests Ordered: Current medicines are reviewed at length with the patient today.  Concerns regarding medicines are outlined above.  Tests Ordered: Orders Placed This Encounter  Procedures   EKG 12-Lead   Medication Changes: Meds ordered this encounter  Medications   nitroGLYCERIN (NITROSTAT) 0.4 MG SL tablet    Sig: Place 1 tablet (0.4 mg total) under the tongue every 5 (five) minutes as needed for chest pain.    Dispense:  25 tablet    Refill:  3    Signed, Richardson Dopp, PA-C  03/01/2019 12:51 PM    [facility] [address], [place], [place]  [address] Phone: [phone]; Fax: [phone]

## 2019-03-01 NOTE — Patient Instructions (Signed)
Medication Instructions:   Your physician recommends that you continue on your current medications as directed. Please refer to the Current Medication list given to you today.  If you need a refill on your cardiac medications before your next appointment, please call your pharmacy.   Lab work:  None ordered today  Testing/Procedures:  None ordered today, we are still waiting on your insurance to approve MRI, once it is approved we will call you to schedule MRI.  Follow-Up:  1 week after your MRI with Ena Dawley, MD

## 2019-03-02 ENCOUNTER — Telehealth: Payer: Self-pay | Admitting: Physician Assistant

## 2019-03-02 DIAGNOSIS — I214 Non-ST elevation (NSTEMI) myocardial infarction: Secondary | ICD-10-CM

## 2019-03-02 NOTE — Telephone Encounter (Signed)
I discussed her case with Dr. Debara Pickett, the cardiologist that saw her in the hospital. We agreed that she should start on an ACE inhibitor.  This is a good drug to take after a heart attack.  It reduces mortality. I tried to call her but her phone would not ring.  Please call the patient.    Start Lisinopril 2.5 mg once daily.  Obtain BMET 2 weeks after starting. Richardson Dopp, PA-C    03/02/2019 5:22 PM

## 2019-03-03 ENCOUNTER — Encounter: Payer: Self-pay | Admitting: *Deleted

## 2019-03-03 MED ORDER — LISINOPRIL 5 MG PO TABS: 2.5000 mg | ORAL_TABLET | Freq: Every day | ORAL | 3 refills | Status: DC

## 2019-03-03 NOTE — Telephone Encounter (Signed)
S/w pt will have a appointment on Nov 4 @ 10:00 per Dr. Meda Coffee.  Pt is aware.

## 2019-03-03 NOTE — Telephone Encounter (Signed)
S/w pt is agreeable to recommendations.  Will start lisinopril one half tablet (2.5 mg ) daily , sent in today to requested pharmacy.  Will come in on Oct 28 for bmet, appt made, orders in and linked.  Sent a message to Dr. Meda Coffee pt is to have a two f/u with Dr.Nelson two weeks after MRI and Nicki Reaper was cc on email.  Waiting to hear back from Dr. Meda Coffee to call pt.

## 2019-03-04 NOTE — Telephone Encounter (Signed)
Follow Up  Patient is calling with concerns about medication Lisinopril. Patient states that she is having issues with BP being low and this medication is for patients with High BP and she is not going to take it until she speaks with Richardson Dopp PA-C or another doctor. Please give patient a call back.

## 2019-03-04 NOTE — Telephone Encounter (Signed)
I spoke to the patient because she was told that this medication (Lisinopril) lowers her BP.  She said that her BP is already low and she is reluctant to start it, because if her BP gets too low, she may become dizzy, according to the pharmacist.  She would like a better explanation as to why she should start this Lisinopril.

## 2019-03-05 NOTE — Telephone Encounter (Signed)
I replied to her via [place].  She sent a message back to me this AM and will start Lisinopril. Richardson Dopp, PA-C    03/05/2019 7:55 AM

## 2019-03-10 ENCOUNTER — Telehealth: Payer: Self-pay | Admitting: *Deleted

## 2019-03-10 NOTE — Telephone Encounter (Signed)
Pts Cardiac MRI is scheduled for 03/17/19, and her follow-up visit with Dr. Meda Coffee is scheduled for 03/24/19 at 1000. Pt made aware of appt dates and times by Cardiac MRI scheduler and [facility].

## 2019-03-10 NOTE — Telephone Encounter (Signed)
-----   Message from Dorothy Spark, MD sent at 03/01/2019 12:06 PM EDT ----- Regarding: RE: Needs follow up I will add her to any of my clinic days, but not until she has her cardiac MRI ----- Message ----- From: Nuala Alpha, LPN Sent: 51/06/5850  12:05 PM EDT To: Dorothy Spark, MD Subject: FW: Needs follow up                            Please advise.   Thanks, Karlene Einstein  ----- Message ----- From: Sharmon Revere Sent: 03/01/2019  11:58 AM EDT To: Nuala Alpha, LPN Subject: RE: Needs follow up                            No she needs to be seen sooner. I called Houston Siren after I saw her.  She told me to send her a message so she can arrange an appt with her.  I guess she is planning to work her in somewhere. [place] ----- Message ----- From: Nuala Alpha, LPN Sent: [id number]/82/4235  11:01 AM EDT To: Sharmon Revere, Mady Haagensen, CMA Subject: FW: Needs follow up                            Nicki Reaper, Dr. Meda Coffee has nothing until Jan 2021.  Booked solid virtually and in office.  You want her to be seen in Jan?  Thanks, Karlene Einstein  ----- Message ----- From: Mady Haagensen, CMA Sent: 03/01/2019  10:58 AM EDT To: Nuala Alpha, LPN Subject: Needs follow up                                This patient saw Nicki Reaper today and has an order for a MRI in, waiting on insurance to approve. Patient will need follow up with Dr. Meda Coffee a week after MRI.

## 2019-03-15 ENCOUNTER — Telehealth (HOSPITAL_COMMUNITY): Payer: Self-pay | Admitting: Emergency Medicine

## 2019-03-15 NOTE — Telephone Encounter (Signed)
Reaching out to patient to offer assistance regarding upcoming cardiac imaging study; pt verbalizes understanding of appt date/time, parking situation and where to check in, and verified current allergies; name and call back number provided for further questions should they arise Marchia Bond RN Navigator Cardiac Imaging [facility] [phone] office [phone] cell  Pt reports mitral valve replacement

## 2019-03-17 ENCOUNTER — Ambulatory Visit (HOSPITAL_COMMUNITY)
Admission: RE | Admit: 2019-03-17 | Discharge: 2019-03-17 | Disposition: A | Discharge status: Home / Self Care | Payer: Medicare Other | Source: Ambulatory Visit | Attending: Student | Admitting: Student

## 2019-03-17 ENCOUNTER — Other Ambulatory Visit: Payer: Self-pay

## 2019-03-17 ENCOUNTER — Other Ambulatory Visit: Payer: Medicare Other

## 2019-03-17 DIAGNOSIS — R072 Precordial pain: Secondary | ICD-10-CM | POA: Diagnosis not present

## 2019-03-17 LAB — BASIC METABOLIC PANEL
Anion gap: 12 (ref 5–15)
BUN: 13 mg/dL (ref 8–23)
CO2: 24 mmol/L (ref 22–32)
Calcium: 9.2 mg/dL (ref 8.9–10.3)
Chloride: 98 mmol/L (ref 98–111)
Creatinine, Ser: 0.64 mg/dL (ref 0.44–1.00)
GFR calc Af Amer: >60 mL/min (ref >60)
GFR calc non Af Amer: >60 mL/min (ref >60)
Glucose, Bld: 83 mg/dL (ref 70–99)
Potassium: 4.3 mmol/L (ref 3.5–5.1)
Sodium: 134 mmol/L — ABNORMAL LOW (ref 135–145)

## 2019-03-17 MED ORDER — GADOBUTROL 1 MMOL/ML IV SOLN
7.5000 mL | Freq: Once | INTRAVENOUS | Status: AC | PRN
  Administered 2019-03-17: 7.5 mL via INTRAVENOUS

## 2019-03-24 ENCOUNTER — Encounter: Payer: Self-pay | Admitting: Cardiology

## 2019-03-24 ENCOUNTER — Other Ambulatory Visit: Payer: Self-pay

## 2019-03-24 ENCOUNTER — Ambulatory Visit (INDEPENDENT_AMBULATORY_CARE_PROVIDER_SITE_OTHER): Payer: Medicare Other | Admitting: Cardiology

## 2019-03-24 VITALS — BP 118/64 | HR 66 | Ht 66.5 in | Wt 132.6 lb

## 2019-03-24 DIAGNOSIS — R002 Palpitations: Secondary | ICD-10-CM | POA: Diagnosis not present

## 2019-03-24 DIAGNOSIS — I6523 Occlusion and stenosis of bilateral carotid arteries: Secondary | ICD-10-CM

## 2019-03-24 DIAGNOSIS — I214 Non-ST elevation (NSTEMI) myocardial infarction: Secondary | ICD-10-CM

## 2019-03-24 MED ORDER — ASPIRIN EC 81 MG PO TBEC: 81.0000 mg | DELAYED_RELEASE_TABLET | Freq: Every day | ORAL | 3 refills | Status: DC

## 2019-03-24 NOTE — Patient Instructions (Signed)
Medication Instructions:   START TAKING ASPIRIN 81 MG BY MOUTH DAILY  *If you need a refill on your cardiac medications before your next appointment, please call your pharmacy*    Testing/Procedures:  2 WEEK LONG TERM ZIO PATCH     Follow-Up: At [facility], you and your health needs are our priority.  As part of our continuing mission to provide you with exceptional heart care, we have created designated Provider Care Teams.  These Care Teams include your primary Cardiologist (physician) and Advanced Practice Providers (APPs -  Physician Assistants and Nurse Practitioners) who all work together to provide you with the care you need, when you need it.  Your next appointment:   IN 3 MONTHS-SCHEDULING PLEASE ADD PATIENT TO 06/24/2019 AT 10:40 AM END SLOT ON DR. Francesca Oman SCHEDULE  The format for your next appointment:   Either In Person or Virtual  Provider:   Ena Dawley, MD

## 2019-03-24 NOTE — Progress Notes (Signed)
Cardiology Office Note:    Date:  03/24/2019   ID:  Joy Williams, DOB 10/19/1950, MRN [id number]  PCP:  Joy Dance, DO  Cardiologist:  Joy Dawley, MD   Electrophysiologist:  None   Referring MD: Joy Dance, DO   Reason for visit: Hospitalization follow-up  History of Present Illness:    Joy Williams is a 68 y.o. female with:   MVP with mitral regurgitation   S/p min invasive bioprosthetic MVR in 06/2016 (Joy Williams)  Cath prior to MVR without CAD  Carotid artery Dz  [place] 2018: bilat ICA 40-59  [place] 10/19: bilat ICA 7-42  Diastolic CHF  Hx of TIA  Lung CA (2 primary tumors)  S/p resection; adjuvant chemoTx; managed at [facility]  Asthmatic bronchitis   Raynaud's Disease  Pt notes ?hx of CREST but ultimately ruled out  Hypothyroidism   Tobacco use  Joy Williams was last seen by Joy Williams in 10/2018 via Telemedicine.  She was recently admitted 10/7-10/8 with chest discomfort.  High-sensitivity troponin levels were elevated (1471 >> 1625 >> 1246 >> 1150).  There was concern for non-ST elevation myocardial infarction.  However, cardiac catheterization demonstrated no CAD.  Echocardiogram demonstrated normal LV function and normally functioning mitral valve prosthesis.  She was followed by Joy Williams in the hospital and he felt that her presentation was c/w [facility] (myocardial infarction with non-obstructive coronary arteries) or non-ACS troponin elevation.  The possible causes included myocarditis, coronary spasm, cardioembolic event.  Recommendation was to proceed with cardiac MRI and follow-up thereafter.  However, she has not yet had a cardiac MRI.  03/24/2019, she underwent cardiac MRI that showed focal subendocardial LGE into mid inferolateral wall consistent with infarct seen on territory.  She has no further chest pain or shortness of breath.  Has occasional tendency lasting palpitations.  Prior CV studies:   The following studies were reviewed today:   Cardiac catheterization 02/25/2019 Normal coronary arteries EF 55-65   Echocardiogram 02/25/2019 EF 60-65, mildly increased LV posterior wall thickness, normal RV SF, mild to moderate LAE, MVR with trace MR and no evidence of mitral stenosis, trivial TR, trivial PI  Echo 11/12/2018 EF 60-65, mild LVH, normal wall motion, normal RVSF, bioprosthetic mitral valve with trivial MR and no significant stenosis (mean gradient 10 mmHg), mild dilation of aortic root (37 mm), moderate LAE, possible PFO by color Doppler  Carotid US 03/13/2018 Summary: Right Carotid: Velocities in the right ICA are consistent with a 1-39% stenosis. Left Carotid: Velocities in the left ICA are consistent with a 1-39% stenosis. Vertebrals: Bilateral vertebral arteries demonstrate antegrade flow.  Echo 03/12/2018 Moderate to severe LVH, EF 55-60, normal wall motion, grade 2 diastolic dysfunction, normally functioning mitral valve prosthesis with trivial stenosis (2 mmHg), moderate to severe LAE, trivial TR  Past Medical History:  Diagnosis Date  . Allergy   . Asthma    pt denies this, but is on Dulera  . Chronic diastolic congestive heart failure ([place])   . 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 COPD (chronic obstructive pulmonary disease) ([place])   . Family history of adverse reaction to anesthesia    Mother- very sensitive to medication  . Heart murmur    MVP  . Hypothyroidism   . 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 MVP (mitral valve prolapse)   . PONV (postoperative nausea and vomiting)   . Raynaud's disease    Raynaud's  . Raynaud's syndrome   . 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   31 mm [facility] mitral bovine  bioprosthetic tissue valve placed via right mini thoracotomy approach  . Severe mitral regurgitation 11/15/2014  . Shortness of breath dyspnea    with exertion  . STD (sexually transmitted disease)   . Telangiectasia   . Thyroid disease    Surgical Hx: The patient  has a past surgical history that includes Back surgery; Clavicle surgery (Left, 2013); wrist surgery (Left, 2015); Tonsillectomy; laparoscopy; TEE without cardioversion (N/A, 02/22/2016); Video assisted thoracoscopy (vats)/wedge resection (Left, 12/30/2014); Cardiac catheterization (N/A, 03/08/2016); Mitral valve repair (Right, 06/27/2016); TEE without cardioversion (N/A, 06/27/2016); Lung cancer surgery; Vocal cord injection (Left, 2017); Breast biopsy (Left); and LEFT HEART CATH AND CORONARY ANGIOGRAPHY (N/A, 02/25/2019).   Current Medications: Current Meds  Medication Sig  . acetaminophen (TYLENOL) 500 MG tablet Take 1,000 mg by mouth every 6 (six) hours as needed for mild pain or headache.  Marland Kitchen atorvastatin (LIPITOR) 20 MG tablet Take 1 tablet (20 mg total) by mouth daily at 6 PM.  . azelastine (ASTELIN) 0.1 % nasal spray Place 2 sprays into both nostrils 2 (two) times daily. Use in each nostril as directed  . Cholecalciferol (VITAMIN D-3) 125 MCG (5000 UT) TABS Take 5,000 Units by mouth daily.  . clopidogrel (PLAVIX) 75 MG tablet Take 1 tablet (75 mg total) by mouth daily.  . Fluticasone-Umeclidin-Vilant (TRELEGY ELLIPTA) 100-62.5-25 MCG/INH AEPB Inhale 1 puff into the lungs daily.  Marland Kitchen ibuprofen (ADVIL) 200 MG tablet Take 400 mg by mouth every 6 (six) hours as needed for moderate pain.  Marland Kitchen levothyroxine (SYNTHROID) 175 MCG tablet Take 1 tablet (175 mcg total) by mouth daily before breakfast.  . lisinopril (ZESTRIL) 5 MG tablet Take 0.5 tablets (2.5 mg total) by mouth daily.  . Multiple Vitamins-Minerals (MULTIVITAMIN WITH MINERALS) tablet Take 1 tablet by mouth daily.  . naproxen sodium (ALEVE) 220 MG tablet Take 220 mg by mouth as  needed (pain).  . nitroGLYCERIN (NITROSTAT) 0.4 MG SL tablet Place 1 tablet (0.4 mg total) under the tongue every 5 (five) minutes as needed for chest pain.  . Turmeric 500 MG CAPS Take 500 mg by mouth daily.     Allergies:   Penicillins   Social History   Tobacco Use  . Smoking status: Current Every Day Smoker    Packs/day: 0.25    Years: 24.00    Pack years: 6.00    Types: E-cigarettes, Cigarettes  . Smokeless tobacco: Never Used  Substance Use Topics  . Alcohol use: Yes    Alcohol/week: 2.0 standard drinks    Types: 1 Shots of liquor, 1 Standard drinks or equivalent per week  . Drug use: No     Family Hx: The patient's family history includes Dementia in her father; Mitral valve prolapse in her brother, mother, and sister; Prostate cancer in her father. There is no history of Colon cancer, Colon polyps, Rectal cancer, Esophageal cancer, Stomach cancer, or Heart attack.  ROS:   Please see the history of present illness.    ROS All other systems reviewed and are negative.   EKGs/Labs/Other Test Reviewed:    EKG:  EKG is  ordered today.  The ekg ordered today demonstrates normal sinus rhythm, HR 63,  leftward axis, non-specific ST-TW changes, QTc 476  Recent Labs: 11/11/2018: TSH 1.790 02/24/2019: ALT 31 02/25/2019: Hemoglobin 12.1; Platelets 209 03/17/2019: BUN 13; Creatinine, Ser 0.64; Potassium 4.3; Sodium 134   Recent Lipid Panel Lab Results  Component Value Date/Time   CHOL 133 02/25/2019 05:28 AM   CHOL 150 11/11/2018 09:46 AM   CHOL 177 10/28/2014 10:25 AM   TRIG 74 02/25/2019 05:28 AM   TRIG 115 10/28/2014 10:25 AM   HDL 61 02/25/2019 05:28 AM   HDL 70 11/11/2018 09:46 AM   HDL 70 10/28/2014 10:25 AM   CHOLHDL 2.2 02/25/2019 05:28 AM   LDLCALC 57 02/25/2019 05:28 AM   LDLCALC 64 11/11/2018 09:46 AM   LDLCALC 84 10/28/2014 10:25 AM   LDLDIRECT 68 11/11/2018 09:46 AM    Physical Exam:    VS:  BP 118/64   Pulse 66   Ht 5' 6.5" (1.689 m)   Wt 132 lb 9.6  oz (60.1 kg)   SpO2 99%   BMI 21.08 kg/m     Wt Readings from Last 3 Encounters:  03/24/19 132 lb 9.6 oz (60.1 kg)  03/01/19 132 lb 6.4 oz (60.1 kg)  02/24/19 130 lb 15.3 oz (59.4 kg)     Physical Exam  Constitutional: She is oriented to person, place, and time. She appears well-developed and well-nourished. No distress.  HENT:  Head: Normocephalic and atraumatic.  Eyes: No scleral icterus.  Neck: No JVD present. No thyromegaly present.  Cardiovascular: Normal rate, regular rhythm and normal heart sounds.  No murmur heard. Pulmonary/Chest: Effort normal and breath sounds normal. She has no rales.  Abdominal: Soft. There is no hepatomegaly.  Musculoskeletal:        General: No edema.     Comments: R wrist without hematoma  Lymphadenopathy:    She has no cervical adenopathy.  Neurological: She is alert and oriented to person, place, and time.  Skin: Skin is warm and dry.  Psychiatric: She has a normal mood and affect.    ASSESSMENT & PLAN:    1. NSTEMI (non-ST elevated myocardial infarction) [facility]) Patient is status post recent admission with substernal chest pain and elevated troponin levels.  However, there was no CAD on cardiac catheterization. MRI showed focal LGE in the mid inferolateral wall highly suspicious for thromboembolic event.  I will add aspirin 81 mg daily to clopidogrel and start 14-day Zio patch monitor to check for possible paroxysmal atrial fibrillation.  With prior history of mitral regurgitation this is high on differential diagnosis, her left atrium is mildly to moderately dilated. Continue atorvastatin, her lipids are excellent.  2. Mitral valve insufficiency, unspecified etiology  3. 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 Recent echocardiogram with normally functioning mitral valve prosthesis.  Continue SBE prophylaxis.  4. Bilateral carotid artery stenosis Bilateral ICA stenosis 1-39 by carotid US in October 2019.   Continue clopidogrel, statin therapy.  5. Hyperlipidemia LDL goal <70 LDL optimal on most recent lab work.  Continue current Rx.     Dispo: Follow-up in 3 months.  Medication Adjustments/Labs and Tests Ordered: Current medicines are reviewed at length with the patient today.  Concerns regarding medicines are outlined above.  Tests Ordered: No orders of the defined types were placed in this encounter.  Medication Changes: No orders of the defined types were placed in this encounter.   Signed, Joy Dawley, MD  03/24/2019 10:12 AM    [place], [place]  [address] Phone: [phone]; Fax: [phone]

## 2019-03-25 ENCOUNTER — Other Ambulatory Visit: Payer: Self-pay

## 2019-03-31 ENCOUNTER — Other Ambulatory Visit: Payer: Self-pay | Admitting: *Deleted

## 2019-03-31 NOTE — Patient Outreach (Signed)
[facility]) Care Management  03/31/2019  Joy Williams June 23, 1950 [id number]   Telephone Screen  Referral Date:  03/25/2019 Referral Source:  EMMI Prevent Reason for Referral:  Screening Insurance:  Medicare   Outreach Attempt:  Successful telephone outreach to patient for telephone screening post EMMI Prevent call.  HIPAA verified with patient.  [facility] services reviewed and discussed.  Patient declining screening and services at this time.  States she feels she is ok and managing well.  Agrees to receive [facility] pamphlet in mail.  Encouraged to contact [facility] in the future if needs arise.  Plan:  RN Health Coach will close case at this time due to patient declining services and make inactive with [facility].   [email or web address] [phone] [email or web address]

## 2019-04-01 ENCOUNTER — Other Ambulatory Visit: Payer: Self-pay | Admitting: Family Medicine

## 2019-04-01 ENCOUNTER — Telehealth: Payer: Self-pay

## 2019-04-01 NOTE — Telephone Encounter (Signed)
Please call pt to schedule appt.  No further refills until pt is seen.  T. Nelson, CMA  

## 2019-04-05 ENCOUNTER — Telehealth: Payer: Self-pay | Admitting: *Deleted

## 2019-04-05 NOTE — Telephone Encounter (Signed)
14 day ZIO XT long term holter monitor to be mailed to the patients home.  Instructions reviewed briefly as they are included in the monitor kit.

## 2019-04-17 ENCOUNTER — Ambulatory Visit (INDEPENDENT_AMBULATORY_CARE_PROVIDER_SITE_OTHER): Payer: Medicare Other

## 2019-04-17 DIAGNOSIS — R002 Palpitations: Secondary | ICD-10-CM | POA: Diagnosis not present

## 2019-04-17 DIAGNOSIS — I214 Non-ST elevation (NSTEMI) myocardial infarction: Secondary | ICD-10-CM

## 2019-04-21 ENCOUNTER — Other Ambulatory Visit: Payer: Self-pay | Admitting: Family Medicine

## 2019-04-21 ENCOUNTER — Telehealth: Payer: Self-pay | Admitting: Family Medicine

## 2019-04-21 MED ORDER — VITAMIN D (ERGOCALCIFEROL) 1.25 MG (50000 UNIT) PO CAPS: 50000.0000 [IU] | ORAL_CAPSULE | ORAL | 3 refills | Status: DC

## 2019-04-21 NOTE — Telephone Encounter (Signed)
Patient called, stating that she had a COVID test done today but won't have the results back for a few days, states that one stepdaugher has tested positive and the other stepdaughter possibly has it as well, and she is very concered since she has had other health issues such as lung cancer and heart problems and wanted to discuss if there is any way she can be prescribed Hydroxychloroquine. Please Advise. - AM

## 2019-04-21 NOTE — Telephone Encounter (Signed)
Patient is aware of the below and wanted to know if she can take ZINC or Vitamin D for preventative care. Patient is not symptomatic but is concerned that she has contracted the virus. I asked Dr. Jenetta Downer about this and Dr. Jenetta Downer said since her Vitamin D level is low (see labs from 10/2018) patient could start Vit D 50000IU once weekly. Patient med sent to [facility] in [place]. Patient is aware and had no further questions. AS< CMA

## 2019-04-21 NOTE — Telephone Encounter (Signed)
Please advise. AS, CMA 

## 2019-04-21 NOTE — Telephone Encounter (Signed)
Please the patient know at this time this is not a medically excepted treatment plan- this is not evidence based medicine- and hence no we do not give hydroxychloroquine.

## 2019-04-27 ENCOUNTER — Other Ambulatory Visit: Payer: Self-pay | Admitting: Family Medicine

## 2019-05-06 ENCOUNTER — Other Ambulatory Visit: Payer: Self-pay

## 2019-05-06 DIAGNOSIS — E559 Vitamin D deficiency, unspecified: Secondary | ICD-10-CM

## 2019-05-07 ENCOUNTER — Other Ambulatory Visit: Payer: Medicare Other

## 2019-05-07 ENCOUNTER — Other Ambulatory Visit: Payer: Self-pay

## 2019-05-07 DIAGNOSIS — E559 Vitamin D deficiency, unspecified: Secondary | ICD-10-CM

## 2019-05-08 LAB — VITAMIN D 25 HYDROXY (VIT D DEFICIENCY, FRACTURES): Vit D, 25-Hydroxy: 40.4 ng/mL (ref 30.0–100.0)

## 2019-05-10 ENCOUNTER — Encounter: Payer: Self-pay | Admitting: Family Medicine

## 2019-05-10 ENCOUNTER — Other Ambulatory Visit: Payer: Self-pay

## 2019-05-10 ENCOUNTER — Ambulatory Visit (INDEPENDENT_AMBULATORY_CARE_PROVIDER_SITE_OTHER): Payer: Medicare Other | Admitting: Family Medicine

## 2019-05-10 VITALS — Temp 97.3°F | Ht 66.5 in | Wt 135.0 lb

## 2019-05-10 DIAGNOSIS — C3492 Malignant neoplasm of unspecified part of left bronchus or lung: Secondary | ICD-10-CM

## 2019-05-10 DIAGNOSIS — I214 Non-ST elevation (NSTEMI) myocardial infarction: Secondary | ICD-10-CM

## 2019-05-10 DIAGNOSIS — J449 Chronic obstructive pulmonary disease, unspecified: Secondary | ICD-10-CM | POA: Diagnosis not present

## 2019-05-10 DIAGNOSIS — I5032 Chronic diastolic (congestive) heart failure: Secondary | ICD-10-CM | POA: Diagnosis not present

## 2019-05-10 DIAGNOSIS — Z716 Tobacco abuse counseling: Secondary | ICD-10-CM

## 2019-05-10 DIAGNOSIS — E785 Hyperlipidemia, unspecified: Secondary | ICD-10-CM

## 2019-05-10 DIAGNOSIS — E039 Hypothyroidism, unspecified: Secondary | ICD-10-CM

## 2019-05-10 DIAGNOSIS — I6523 Occlusion and stenosis of bilateral carotid arteries: Secondary | ICD-10-CM

## 2019-05-10 DIAGNOSIS — F17200 Nicotine dependence, unspecified, uncomplicated: Secondary | ICD-10-CM

## 2019-05-10 DIAGNOSIS — Z952 Presence of prosthetic heart valve: Secondary | ICD-10-CM

## 2019-05-10 DIAGNOSIS — Z8673 Personal history of transient ischemic attack (TIA), and cerebral infarction without residual deficits: Secondary | ICD-10-CM

## 2019-05-10 DIAGNOSIS — I779 Disorder of arteries and arterioles, unspecified: Secondary | ICD-10-CM

## 2019-05-10 MED ORDER — LEVOTHYROXINE SODIUM 175 MCG PO TABS: ORAL_TABLET | ORAL | 1 refills | Status: DC

## 2019-05-10 NOTE — Patient Instructions (Addendum)
Ask Cardiology about possibly doing cardiac rehab- see if you qualify for that.

## 2019-05-10 NOTE — Progress Notes (Signed)
Virtual / live video office visit note for [employer], D.O- Primary Care Physician at [facility]   I connected with current patient today and beyond visually recognizing the correct individual, I verified that I am speaking with the correct person using two identifiers.  . Location of the patient: Home . Location of the provider: Office Only the patient (+/- their family members at pt's discretion) and myself were participating in the encounter    - This visit type was conducted due to national recommendations for restrictions regarding the COVID-19 Pandemic (e.g. social distancing) in an effort to limit this patient's exposure and mitigate transmission in our community.  This format is felt to be most appropriate for this patient at this time.   - The patient did have access to video technology today  - No physical exam could be performed with this format, beyond that communicated to [place] by the patient/ family members as noted.   - Additionally my office staff/ schedulers discussed with the patient that there may be a monetary charge related to this service, depending on patient's medical insurance.   The patient expressed understanding, and agreed to proceed.      History of Present Illness: Medication Refill  And this is actually a hosp f/up since I have not seen her since her NSTEMI   I, Toni Amend, am serving as scribe for Dr. Mellody Dance.   Patient has some questions today regarding expectations for COVID vaccine rollout.   - Recent Hospitalization for NSTEMI  Hospitalized for NSTEMI on February 24, 2019.   -Regarding pt's recent hospitalization and/or ED visit: reviewed recent hospitalization notes- admission and discharge notes, clinical lab tests, tests in the radiology section of CPT, tests in the medicine testing of CPT, and obtained history from pt.   Thinks planning for her daughter's wedding on October 10 stressed her out.  Three days prior to her  hospitalization, notes she had "heavy chest pains that lasted all day."  Notes "it felt like somebody was sitting on my chest."  "They found something in the blood that measures the heart muscle, and it was through the roof."   Her troponin was in the 1600-1200 range she was in the hospital for over 24 hours, but was happy to be released in time for her daughter's wedding.  Went to see Dr. Meda Coffee after discharge ( has been to cardiology twice since her discharge and these notes were reviewed )  and obtained cardiac catheterization.  Also underwent a heart MRI, and notes "everything looked good."  Says "they do say stress is one thing that would shoot that blood level up higher, so it could have been stress, but the only thing [Dr. Nelson] added was a half-tablet of lisinopril, because I already have low blood pressure."  She continues taking one half-tablet daily.    Patient was also given a heart monitor patch to wear; notes "I only really pushed it once; it's already sent back and I have a follow-up with Dr. Francesca Oman office the 28th of December."  Notes sometimes she sees the PA [employer] because Dr. Francesca Oman hours are busy.  Says she knows she needed to make an appointment here afterward, but between her daughter's wedding, Thanksgiving, and Christmas coming up, she had difficulty scheduling this- too busy.  CURRENTLY: pt denies chest discomfort / pressure since the event.  Denies heart flutters.  Says "if I stand up too fast in the morning, I get  a little lightheaded, but I find if I drink water the minute I wake up, it goes away."  She has not had to use her nitroglycerin since the episode.  She takes a tylenol PM at night, "it helps me relax overnight, but nothing on the regular."  She does not take ibuprofen.  Patient notes that her doctors recommended 6000 steps per day.  Says "I get that maybe once every two weeks; I probably do closer to about 3000 a day.  Do I do the type of exercise that  stimulates my heart?  No."    Notes they did not discuss cardiac rehab.  She did have cardiac rehab in the past, after her valve replacement.   Covid concerns: Notes "I wear my mask everywhere, wash my hands, and social distance."  - Daughter's [employer] States the event included 30 people, and the wedding ceremony was at the [employer], in [place], outside.  Notes it rained.  For the reception, the patient had a 30 by 30 tent set up in the driveway, and took pictures of her daughter and the groom in the driveway.  Notes "it was exactly what they needed; they got married."   - Hypothyroidism Denies symptoms of hypothyroidism.  Notes she does take a nap after lunch most afternoons, but "nothing out of the ordinary to report."   Tob Use: Smoker at 0.25 ppd;  - smoked for over 30 years or so total,  less than a pack per day, but roughly has a 30-pack-year history Patient with history of lung cancer status post surgical resection-wedge x2 and adjunct chemo with longstanding and ongoing tobacco abuse.   - Patient still smoking.  No desire to quit at current state.   - She says covid too stressful and she needs "some pleasures" in life    HPI:  Hypertension:  -  Her blood pressure at home has been running: "Normal, low but normal;" states "110/something."  Low blood pressure is not unusual for her.  Only experiences dizziness if she stands in the morning.  She doesn't usually check her BP in the morning; she first drinks her water, her protein drink, and after this, states "everything is fine."  Reiterates "I think I just run at a low blood pressure."  - Patient reports good compliance with medication and/or lifestyle modification  - Her denies acute concerns or problems related to treatment plan  - She denies new onset of: chest pain, exercise intolerance, shortness of breath, visual changes, headache, lower extremity swelling or claudication.   Last 3 blood pressure  readings in our office are as follows: BP Readings from Last 3 Encounters:  03/24/19 118/64  03/01/19 118/64  02/25/19 110/72   Filed Weights   05/10/19 0859  Weight: 135 lb (61.2 kg)     HPI:  Hyperlipidemia:  68 y.o. female here for cholesterol follow-up.   - Patient reports good compliance with treatment plan of:  medication and/ or lifestyle management.    - Patient denies any acute concerns or problems with management plan   - She denies new onset of: myalgias, arthralgias, increased fatigue more than normal, chest pains, exercise intolerance, shortness of breath, dizziness, visual changes, headache, lower extremity swelling or claudication.   Most recent cholesterol panel was:  Lab Results  Component Value Date   CHOL 133 02/25/2019   HDL 61 02/25/2019   LDLCALC 57 02/25/2019   LDLDIRECT 68 11/11/2018   TRIG 74 02/25/2019   CHOLHDL 2.2 02/25/2019  Hepatic Function Latest Ref Rng & Units 02/24/2019 04/28/2018 03/11/2018  Total Protein 6.5 - 8.1 g/dL 7.5 - 7.0  Albumin 3.5 - 5.0 g/dL 4.3 - 3.8  AST 15 - 41 U/L 47(H) - 29  ALT 0 - 44 U/L _0 Alk Phosphatase 38 - 126 U/L 101 - 56  Total Bilirubin 0.3 - 1.2 mg/dL 0.9 - 0.5  Bilirubin, Direct 0.0 - 0.3 mg/dL - - -      Depression screen [facility] 2/9 11/16/2018 11/05/2018 04/27/2018 03/17/2018 10/14/2017  Decreased Interest 0 0 0 1 0  Down, Depressed, Hopeless 0 0 0 0 0  PHQ - 2 Score 0 0 0 1 0  Altered sleeping 0 0 0 0 0  Tired, decreased energy 0 0 0 2 0  Change in appetite 0 0 0 0 0  Feeling bad or failure about yourself  0 0 0 0 0  Trouble concentrating 0 0 0 1 0  Moving slowly or fidgety/restless 0 0 0 1 0  Suicidal thoughts 0 0 0 0 0  PHQ-9 Score 0 0 0 5 0  Difficult doing work/chores Not difficult at all Not difficult at all Not difficult at all Somewhat difficult -    GAD 7 : Generalized Anxiety Score 11/16/2018 11/05/2018 01/22/2017  Nervous, Anxious, on Edge 0 0 0  Control/stop worrying 0 0 0  Worry too  much - different things 0 0 0  Trouble relaxing 0 0 0  Restless 0 0 0  Easily annoyed or irritable 0 0 0  Afraid - awful might happen 0 0 0  Total GAD 7 Score 0 0 0  Anxiety Difficulty Not difficult at all Not difficult at all Not difficult at all     Impression and Recommendations:    1. NSTEMI (non-ST elevation myocardial infarction) [facility])-  Oct 2020   2. Chronic diastolic congestive heart failure ([place])   3. Malignant neoplasm of left lung, unspecified part of lung ([place])   4. Chronic obstructive pulmonary disease, unspecified COPD type ([place])   5. Bilateral carotid artery disease, unspecified type ([place])   6. Hx of transient ischemic attack (TIA)   7. Hyperlipidemia LDL goal <70   8. S/P MVR (mitral valve replacement)   9. Hypothyroidism, unspecified type   10. Tobacco use disorder   11. Tobacco abuse counseling     Recent Hospitalization for NSTEMI February 23, 3874 Chronic Diastolic CHF, Bilateral Carotid Artery Disease, s/p MVR, h/o TIA - Patient with extensive ASCVD - must engage in intensive risk modification  - LDL is at goal - 36-  65moago.    - Managed on aspirin, Plavix, Atorvastatin, and Nitroglycerin for vasospasms. - Treatment plan managed by cardiology.  Follows up with Dr. KEna Dawleyand SRichardson Dopp PUtah  - Recent hospitalization for NSTEMI MI on October 7 of 2020. - No CAD demonstrated on cardiac catheterization. - Echocardiogram demonstrated normal LV function  - She had a cardiac MRI recently which I reviewed also- done 03/24/2019,- showed focal subendocardial LGE into mid inferolateral wall territory consistent with infarct.  - BP was too low  ( has always been low) and did not start patient on beta blocker, just low dose ACE inh. - Reviewed that EF was normal last check at 60%.  - Regarding pt's recent hospitalization and/or ED visit: reviewed in great detail recent hospitalization notes, clinical lab tests, tests in the radiology section of CPT, tests  in the medicine testing of CPT, and obtained  history from patient. Moderate complexity.  - Strongly encouraged patient to inquire with cardiology regarding cardiac rehab. - Patient knows to call and see if she qualifies. I think it would be great for her Cardiopulmonary fitness level if she can qualify  - Extensive health counseling provided.  Reviewed critical importance of ongoing prudent cardiovascular exercise, following recommendations as established by Cardiology, and AHA guidelines for exercise as well as heart healthy diet  - Patient will continue to obtain follow-up through specialist as established, and will monitor as well.   Hyperlipidemia, LDL goal < 70 - Continue statin under direction of Cards as well.  - Ongoing prudent dietary changes such as low saturated & trans fat diets for hyperlipidemia and low carb diets for hypertriglyceridemia discussed with patient.    - We will continue to monitor and re-check as discussed.   Hypothyroidism - Thyroid levels stable last review, 11/11/2018. - TSH = 1.79 - T4 = 1.16  - Continue management as established, pt asx and stable. - Refill provided today.  See med list.  - Patient asymptomatic at this time. - Pt knows to monitor for symptoms and call if she ever feels symptomatic.  - Will continue to monitor.   Vitamin D Deficiency - Last OV, started once daily 5000 IU's in addition to once weekly supplementation. - Patient reports good compliance with treatment plan.  - Vitamin D last checked three days ago, 40.4, up from 23.7 prior. - Reviewed goal of Vitamin D in 40-60 range.  - Continue supplementation as established.  See med list. - Will continue to monitor and re-check as discussed.    COVID-19 Counseling - Novel Covid -19 counseling done; all questions were answered.   - Current CDC / federal and [place] guidelines reviewed with patient. - Conversation held regarding expectations for the vaccination  process.  - Reminded pt of extreme importance of social distancing; wearing a mask when out in public; insensate handwashing and cleaning of surfaces, avoiding unnecessary trips for shopping and avoiding ALL but emergency appts etc. - Told patient to be prepared, not scared; and be smart for the sake of others  - Patient will call with any additional concerns.  - Encouraged patient to research COVID updates on [employer] as well as [facility].   Health Counseling - Advised patient to continue working toward exercising to improve overall mental, physical, and emotional health.    - Healthy dietary habits encouraged, including low-carb, and high amounts of lean protein in diet.   - Patient should also consume adequate amounts of water.   Recommendations - Re-check lab work in June 2021. -in 6 mo  - Last obtained Medicare Wellness 04/27/2018.  Return near future for that so we can ensure health maintenance is up to date.   - As part of my medical decision making, I reviewed the following data within the [place] History obtained from pt /family, CMA notes reviewed and incorporated if applicable, Labs reviewed, Radiograph/ tests reviewed if applicable and OV notes from prior OV's with me, as well as other specialists she/he has seen since seeing me last, were all reviewed and used in my medical decision making process today.   - Additionally, discussion had with patient regarding txmnt plan, their biases about that plan etc were used in my medical decision making today.   - The patient agreed with the plan and demonstrated an understanding of the instructions.   No barriers to understanding were identified.    -  Red flag symptoms and signs discussed in detail.  Patient expressed understanding regarding what to do in case of emergency_0 urgent symptoms.  - The patient was advised to call back or seek an in-person evaluation if the symptoms worsen or if the  condition fails to improve as anticipated.   Return for needs medicare wellness OV near future & will need recheck lab work in June of 2021.    Meds ordered this encounter  Medications  . levothyroxine (SYNTHROID) 175 MCG tablet    Sig: TAKE 1 TABLET DAILY BEFORE BREAKFAST.    Dispense:  90 tablet    Refill:  1    Medications Discontinued During This Encounter  Medication Reason  . ibuprofen (ADVIL) 200 MG tablet   . levothyroxine (SYNTHROID) 175 MCG tablet Reorder     Note:  This note was prepared with assistance of Dragon voice recognition software. Occasional wrong-word or sound-a-like substitutions may have occurred due to the inherent limitations of voice recognition software.   This document serves as a record of services personally performed by Mellody Dance, DO. It was created on her behalf by Toni Amend, a trained medical scribe. The creation of this record is based on the scribe's personal observations and the provider's statements to them.   This case required medical decision making of at least moderate complexity. The above documentation has been reviewed to be accurate and was completed by Marjory Sneddon, D.O.       Patient Care Team    Relationship Specialty Notifications Start End  Mellody Dance, DO PCP - General Family Medicine  01/06/17   Dorothy Spark, MD PCP - Cardiology Cardiology Admissions 02/25/19   Dorothy Spark, MD Consulting Physician Cardiology  01/06/17   Tanda Rockers, MD Consulting Physician Pulmonary Disease  01/06/17   Hurley Cisco, MD Consulting Physician Rheumatology  01/06/17   Lerry Paterson, MD Referring Physician   01/06/17    Comment: lung ca Surgeon  Lissa Morales, MD Referring Physician Internal Medicine  01/06/17    Comment: Oncologist- Chemo doc  [name], MD Consulting Physician Infectious Diseases  03/17/18    Comment: + RPR test    -Vitals obtained; medications/ allergies reconciled;   personal medical, social, Sx etc.histories were updated by CMA, reviewed by me and are reflected in chart  Patient Active Problem List   Diagnosis Date Noted  . Hyperlipidemia LDL goal <70 03/12/2018  . Tobacco use disorder 01/22/2017  . Tobacco abuse counseling 01/22/2017  . Chronic obstructive pulmonary disease ([place])   . 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 Malignant neoplasm of left lung ([place]) 12/30/2014  . Malignant neoplasm of overlapping sites of left lung ([place]) 12/06/2014  . Scleroderma ([place]) 10/03/2014  . Age-related osteoporosis without current pathological fracture 02/20/2017  . Estrogen deficiency 01/14/2017  . H/O cardiac catheterization- in prep for lung ca sx 01/06/2017  . Hypothyroidism 01/06/2017  . 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 Chronic diastolic congestive heart failure ([place])   . Asthmatic bronchitis , chronic ([place]) 10/03/2014  . Current Cigarette smoker 10/03/2014  . 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 Neoplastic malignant related fatigue 01/22/2017  . Vitamin D insufficiency 01/22/2017  . Previous back surgeries 01/06/2017  . Antineoplastic chemotherapy induced anemia 05/05/2015  . Raynaud's disease 01/19/2015  . Severe mitral regurgitation 11/15/2014  . Multiple pulmonary nodules 10/03/2014  . History of TIA (transient ischemic  attack) 05/10/2019  . Non-ST elevated myocardial infarction ([place]) 02/25/2019  . NSTEMI (non-ST elevated myocardial infarction) ([place]) 02/25/2019  . Elevated troponin level not due to acute coronary syndrome   . 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 On statin therapy 04/27/2018  . Memory loss or impairment 04/27/2018  . Complicated migraine 40/34/7425  . 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 Stroke-like episode ([place]) s/p tPA 03/11/2018  . Dysuria 10/14/2017  .  Abnormal finding on urinalysis 10/14/2017  . Pharyngitis 10/14/2017  . Closed nondisplaced fracture of base of fifth metacarpal bone of right hand 05/27/2017  . Decreased range of motion of right ankle 05/27/2017  . Pain and swelling of right wrist 05/27/2017  . ASCUS of cervix with negative high risk HPV 01/29/2017  . History of epistaxis 03/30/2015  . Lung cancer ([place]) 12/30/2014  . Nonintractable episodic headache 12/06/2014     Current Meds  Medication Sig  . acetaminophen (TYLENOL) 500 MG tablet Take 1,000 mg by mouth every 6 (six) hours as needed for mild pain or headache.  Marland Kitchen aspirin EC 81 MG tablet Take 1 tablet (81 mg total) by mouth daily.  Marland Kitchen atorvastatin (LIPITOR) 20 MG tablet Take 1 tablet (20 mg total) by mouth daily at 6 PM.  . azelastine (ASTELIN) 0.1 % nasal spray Place 2 sprays into both nostrils 2 (two) times daily. Use in each nostril as directed  . Cholecalciferol (VITAMIN D-3) 125 MCG (5000 UT) TABS Take 5,000 Units by mouth daily.  . clopidogrel (PLAVIX) 75 MG tablet Take 1 tablet (75 mg total) by mouth daily.  . Fluticasone-Umeclidin-Vilant (TRELEGY ELLIPTA) 100-62.5-25 MCG/INH AEPB Inhale 1 puff into the lungs daily.  Marland Kitchen levothyroxine (SYNTHROID) 175 MCG tablet TAKE 1 TABLET DAILY BEFORE BREAKFAST.  Marland Kitchen lisinopril (ZESTRIL) 5 MG tablet Take 0.5 tablets (2.5 mg total) by mouth daily.  . Multiple Vitamins-Minerals (MULTIVITAMIN WITH MINERALS) tablet Take 1 tablet by mouth daily.  . naproxen sodium (ALEVE) 220 MG tablet Take 220 mg by mouth as needed (pain).  . nitroGLYCERIN (NITROSTAT) 0.4 MG SL tablet Place 1 tablet (0.4 mg total) under the tongue every 5 (five) minutes as needed for chest pain.  . Turmeric 500 MG CAPS Take 500 mg by mouth daily.  . Vitamin D, Ergocalciferol, (DRISDOL) 1.25 MG (50000 UT) CAPS capsule Take 1 capsule (50,000 Units total) by mouth every 7 (seven) days.  . [DISCONTINUED] levothyroxine (SYNTHROID) 175 MCG tablet TAKE 1 TABLET DAILY BEFORE  BREAKFAST. OFFICE VISIT REQUIRED PRIOR TO ANY FURTHER REFILLS     Allergies  Allergen Reactions  . Penicillins Hives    Unknown, occurred as a child Has patient had a PCN reaction causing immediate rash, facial/tongue/throat swelling, SOB or lightheadedness with hypotension: Yes Has patient had a PCN reaction causing severe rash involving mucus membranes or skin necrosis: No Has patient had a PCN reaction that required hospitalization  Has patient had a PCN reaction occurring within the last 10 years: No If all of the above answers are "NO", then may proceed with Cephalosporin use. No     ROS:  See above HPI for pertinent positives and negatives   Objective:   Temperature (!) 97.3 F (36.3 C), temperature source Oral, height 5' 6.5" (1.689 m), weight 135 lb (61.2 kg).  (if some vitals are omitted, this means that patient was UNABLE to obtain them even though they were asked to get them prior to OV today.  They were asked to  call us at their earliest convenience with these once obtained.)  General: A & O * 3; visually in no acute distress; in usual state of health.  Skin: Visible skin appears normal and pt's usual skin color HEENT:  EOMI, head is normocephalic and atraumatic.  Sclera are anicteric. Neck has a good range of motion.  Lips are noncyanotic Chest: normal chest excursion and movement Respiratory: speaking in full sentences, no conversational dyspnea; no use of accessory muscles Psych: insight good, mood- appears full

## 2019-05-11 ENCOUNTER — Ambulatory Visit: Payer: Medicare Other | Attending: Internal Medicine

## 2019-05-11 DIAGNOSIS — Z20822 Contact with and (suspected) exposure to covid-19: Secondary | ICD-10-CM

## 2019-05-12 ENCOUNTER — Encounter: Payer: Self-pay | Admitting: Emergency Medicine

## 2019-05-12 ENCOUNTER — Ambulatory Visit (INDEPENDENT_AMBULATORY_CARE_PROVIDER_SITE_OTHER): Payer: Medicare Other | Admitting: Family Medicine

## 2019-05-12 ENCOUNTER — Encounter: Payer: Self-pay | Admitting: Family Medicine

## 2019-05-12 ENCOUNTER — Ambulatory Visit
Admission: EM | Admit: 2019-05-12 | Discharge: 2019-05-12 | Disposition: A | Discharge status: Home / Self Care | Payer: Medicare Other

## 2019-05-12 ENCOUNTER — Other Ambulatory Visit: Payer: Self-pay

## 2019-05-12 VITALS — BP 128/65 | HR 73 | Temp 99.3°F | Ht 66.5 in | Wt 135.0 lb

## 2019-05-12 DIAGNOSIS — F17200 Nicotine dependence, unspecified, uncomplicated: Secondary | ICD-10-CM

## 2019-05-12 DIAGNOSIS — R509 Fever, unspecified: Secondary | ICD-10-CM | POA: Diagnosis not present

## 2019-05-12 DIAGNOSIS — J069 Acute upper respiratory infection, unspecified: Secondary | ICD-10-CM

## 2019-05-12 DIAGNOSIS — I6523 Occlusion and stenosis of bilateral carotid arteries: Secondary | ICD-10-CM | POA: Diagnosis not present

## 2019-05-12 DIAGNOSIS — R05 Cough: Secondary | ICD-10-CM

## 2019-05-12 DIAGNOSIS — M791 Myalgia, unspecified site: Secondary | ICD-10-CM | POA: Diagnosis not present

## 2019-05-12 NOTE — Discharge Instructions (Addendum)
Important to be quarantine at home while your Covid test results are pending. May alternate between Tylenol, ibuprofen as needed for fever, malaise, body aches. Important to drink plenty of water throughout the day and reintroduce moisture to the air.  This can be achieved by humidifier, boiling pot of water, letting hot shower run, etc. Return for worsening cough, development of shortness of breath, chest pain, persistent/worsening fevers, or fevers that are not controlled by OTC medications.

## 2019-05-12 NOTE — ED Triage Notes (Signed)
Pt presents to [facility] for assessment of body aches, nasal congestion, cough, fevers (relieved with tylenol and ibuprofen), chest tightness with coughing.  Sent here by PCP for further evaluation of causes of fever.  PCR COVID test done yesterday.

## 2019-05-12 NOTE — Progress Notes (Signed)
Virtual / live video office visit note for [employer], D.O- Primary Care Physician at [facility]   I connected with current patient today and beyond visually recognizing the correct individual, I verified that I am speaking with the correct person using two identifiers.  . Location of the patient: Home . Location of the provider: Office Only the patient (+/- their family members at pt's discretion) and myself were participating in the encounter    - This visit type was conducted due to national recommendations for restrictions regarding the COVID-19 Pandemic (e.g. social distancing) in an effort to limit this patient's exposure and mitigate transmission in our community.  This format is felt to be most appropriate for this patient at this time.   - The patient did have access to video technology today  - No physical exam could be performed with this format, beyond that communicated to [place] by the patient/ family members as noted.   - Additionally my office staff/ schedulers discussed with the patient that there may be a monetary charge related to this service, depending on patient's medical insurance.   The patient expressed understanding, and agreed to proceed.      History of Present Illness: Cough (heaviness in chest, head congestion, temp 100.7, bodyaches, no loss of taste or smell, no headache, dizziness, drinking fluids. Symptoms since Monday afternoon. Had COVID test done yesterday [place] location.)    I, Toni Amend, am serving as scribe for Dr. Mellody Dance.    - Acute URI, Onset 2 Days Ago Notes after her OV two days ago, her head started getting stuffy and aching.  Notes "yesterday I could not do anything to my temperature, it got up to 100.7."  Today her temperature is 99.4.  Her symptoms started Monday afternoon.  Notes she "started feeling a lot of 'something going on in my chest and head.'"  She says "it's not that it was hard to  breathe," but that she noticed something "happening in her chest" nonetheless.  Reports body aches, stuffy head.  Notes feeling "a little weak, but I don't know."  Says she had a COVID-19 test yesterday, and knows she has to wait for the results, but she's desperate for prescription relief; "a Z-pack, anything."  Thus far, she has tried tylenol.  Yesterday, states she "could breathe in and breathe out," and reports that her taste and smell are fine, "it's just this temperature, the body aches, the head."  Notes "I just didn't want to go over the (Christmas) weekend without at least having something in my medicine cabinet."  She obtained her COVID test at [facility].  Says she's concerned about going to the Urgent Care for acute treatment during COVID.    Depression screen [facility] 2/9 11/16/2018 11/05/2018 04/27/2018 03/17/2018 10/14/2017  Decreased Interest 0 0 0 1 0  Down, Depressed, Hopeless 0 0 0 0 0  PHQ - 2 Score 0 0 0 1 0  Altered sleeping 0 0 0 0 0  Tired, decreased energy 0 0 0 2 0  Change in appetite 0 0 0 0 0  Feeling bad or failure about yourself  0 0 0 0 0  Trouble concentrating 0 0 0 1 0  Moving slowly or fidgety/restless 0 0 0 1 0  Suicidal thoughts 0 0 0 0 0  PHQ-9 Score 0 0 0 5 0  Difficult doing work/chores Not difficult at all Not difficult at all Not  difficult at all Somewhat difficult -    GAD 7 : Generalized Anxiety Score 11/16/2018 11/05/2018 01/22/2017  Nervous, Anxious, on Edge 0 0 0  Control/stop worrying 0 0 0  Worry too much - different things 0 0 0  Trouble relaxing 0 0 0  Restless 0 0 0  Easily annoyed or irritable 0 0 0  Afraid - awful might happen 0 0 0  Total GAD 7 Score 0 0 0  Anxiety Difficulty Not difficult at all Not difficult at all Not difficult at all     Impression and Recommendations:    1. Viral URI   2. Fever, unspecified fever cause   3. Myalgia      Acute URI - Reviewed patient's symptoms extensively during appointment  today.  - Told patient to visit Urgent Care for in-person assessment and acute care, including flu test and physical examination wherein a provider listens to her chest.  Discussed that patient may need chest X-ray, blood work, and further evaluation.  - Advised patient regarding prudent antibiotic use.  Education provided and questions answered.  - Recommended several local Urgent Cares to patient today.  Advised searching [place] online for sites and appointment scheduling as needed.  - Patient knows to call with further questions or seek emergency care as needed.  COVID-19 Counseling - Novel Covid -19 counseling done; all questions were answered.   - Current CDC / federal and [place] guidelines reviewed with patient  - Reminded pt of extreme importance of social distancing; wearing a mask when out in public; insensate handwashing and cleaning of surfaces, avoiding unnecessary trips for shopping and avoiding ALL but emergency appts etc. - Told patient to be prepared, not scared; and be smart for the sake of others - Patient will call with any additional concerns  - Additionally, discussion had with patient regarding txmnt plan, their biases about that plan etc were used in my medical decision making today.   - The patient agreed with the plan and demonstrated an understanding of the instructions.   No barriers to understanding were identified.    - Red flag symptoms and signs discussed in detail.  Patient expressed understanding regarding what to do in case of emergency\ urgent symptoms.  The patient was advised to call back or seek an in-person evaluation if the symptoms worsen or if the condition fails to improve as anticipated.   Return for F-up of current med issues as previously d/c pt.    Note:  This note was prepared with assistance of Dragon voice recognition software. Occasional wrong-word or sound-a-like substitutions may have occurred due to the inherent limitations of  voice recognition software.   This document serves as a record of services personally performed by Mellody Dance, DO. It was created on her behalf by Toni Amend, a trained medical scribe. The creation of this record is based on the scribe's personal observations and the provider's statements to them.   This case required medical decision making of at least moderate complexity. The above documentation has been reviewed to be accurate and was completed by Marjory Sneddon, D.O.       Patient Care Team    Relationship Specialty Notifications Start End  Mellody Dance, DO PCP - General Family Medicine  01/06/17   Dorothy Spark, MD PCP - Cardiology Cardiology Admissions 02/25/19   Dorothy Spark, MD Consulting Physician Cardiology  01/06/17   Tanda Rockers, MD Consulting Physician Pulmonary Disease  01/06/17  Hurley Cisco, MD Consulting Physician Rheumatology  01/06/17   Lerry Paterson, MD Referring Physician   01/06/17    Comment: lung ca Surgeon  Lissa Morales, MD Referring Physician Internal Medicine  01/06/17    Comment: Oncologist- Chemo doc  [name], MD Consulting Physician Infectious Diseases  03/17/18    Comment: + RPR test    -Vitals obtained; medications/ allergies reconciled;  personal medical, social, Sx etc.histories were updated by CMA, reviewed by me and are reflected in chart  Patient Active Problem List   Diagnosis Date Noted  . Hyperlipidemia LDL goal <70 03/12/2018  . Tobacco use disorder 01/22/2017  . Tobacco abuse counseling 01/22/2017  . Chronic obstructive pulmonary disease ([place])   . 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 Malignant neoplasm of left lung ([place]) 12/30/2014  . Malignant neoplasm of overlapping sites of left lung ([place]) 12/06/2014  . Scleroderma ([place]) 10/03/2014  . Age-related osteoporosis without current pathological fracture 02/20/2017  . Estrogen deficiency 01/14/2017  . H/O cardiac catheterization- in prep  for lung ca sx 01/06/2017  . Hypothyroidism 01/06/2017  . 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 Chronic diastolic congestive heart failure ([place])   . Asthmatic bronchitis , chronic ([place]) 10/03/2014  . Current Cigarette smoker 10/03/2014  . 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 Neoplastic malignant related fatigue 01/22/2017  . Vitamin D insufficiency 01/22/2017  . Previous back surgeries 01/06/2017  . Antineoplastic chemotherapy induced anemia 05/05/2015  . Raynaud's disease 01/19/2015  . Severe mitral regurgitation 11/15/2014  . Multiple pulmonary nodules 10/03/2014  . History of TIA (transient ischemic attack) 05/10/2019  . Non-ST elevated myocardial infarction ([place]) 02/25/2019  . NSTEMI (non-ST elevated myocardial infarction) ([place]) 02/25/2019  . Elevated troponin level not due to acute coronary syndrome   . 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 On statin therapy 04/27/2018  . Memory loss or impairment 04/27/2018  . Complicated migraine 46/96/2952  . 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 Stroke-like episode ([place]) s/p tPA 03/11/2018  . Dysuria 10/14/2017  . Abnormal finding on urinalysis 10/14/2017  . Pharyngitis 10/14/2017  . Closed nondisplaced fracture of base of fifth metacarpal bone of right hand 05/27/2017  . Decreased range of motion of right ankle 05/27/2017  . Pain and swelling of right wrist 05/27/2017  . ASCUS of cervix with negative high risk HPV 01/29/2017  . History of epistaxis 03/30/2015  . Lung cancer ([place]) 12/30/2014  . Nonintractable episodic headache 12/06/2014     Current Meds  Medication Sig  . acetaminophen (TYLENOL) 500 MG tablet Take 1,000 mg by mouth every 6 (six) hours as needed for mild pain or headache.  Marland Kitchen aspirin EC 81 MG tablet Take 1 tablet (81 mg total) by mouth daily.  Marland Kitchen  atorvastatin (LIPITOR) 20 MG tablet Take 1 tablet (20 mg total) by mouth daily at 6 PM.  . azelastine (ASTELIN) 0.1 % nasal spray Place 2 sprays into both nostrils 2 (two) times daily. Use in each nostril as directed  . Cholecalciferol (VITAMIN D-3) 125 MCG (5000 UT) TABS Take 5,000 Units by mouth daily.  . clopidogrel (PLAVIX) 75 MG tablet Take 1 tablet (75 mg total) by mouth daily.  . Fluticasone-Umeclidin-Vilant (TRELEGY ELLIPTA) 100-62.5-25 MCG/INH AEPB Inhale 1 puff into the lungs daily.  Marland Kitchen levothyroxine (SYNTHROID) 175 MCG tablet TAKE 1 TABLET DAILY BEFORE BREAKFAST.  Marland Kitchen lisinopril (ZESTRIL)  5 MG tablet Take 0.5 tablets (2.5 mg total) by mouth daily.  . Multiple Vitamins-Minerals (MULTIVITAMIN WITH MINERALS) tablet Take 1 tablet by mouth daily.  . naproxen sodium (ALEVE) 220 MG tablet Take 220 mg by mouth as needed (pain).  . nitroGLYCERIN (NITROSTAT) 0.4 MG SL tablet Place 1 tablet (0.4 mg total) under the tongue every 5 (five) minutes as needed for chest pain.  . Turmeric 500 MG CAPS Take 500 mg by mouth daily.  . Vitamin D, Ergocalciferol, (DRISDOL) 1.25 MG (50000 UT) CAPS capsule Take 1 capsule (50,000 Units total) by mouth every 7 (seven) days.     Allergies  Allergen Reactions  . Penicillins Hives    Unknown, occurred as a child Has patient had a PCN reaction causing immediate rash, facial/tongue/throat swelling, SOB or lightheadedness with hypotension: Yes Has patient had a PCN reaction causing severe rash involving mucus membranes or skin necrosis: No Has patient had a PCN reaction that required hospitalization  Has patient had a PCN reaction occurring within the last 10 years: No If all of the above answers are "NO", then may proceed with Cephalosporin use. No     ROS:  See above HPI for pertinent positives and negatives   Objective:   Blood pressure 128/65, pulse 73, temperature 99.3 F (37.4 C), temperature source Oral, height 5' 6.5" (1.689 m), weight 135 lb (61.2  kg).  (if some vitals are omitted, this means that patient was UNABLE to obtain them even though they were asked to get them prior to OV today.  They were asked to call us at their earliest convenience with these once obtained.)  General: A & O * 3; visually in no acute distress; in usual state of health.  Skin: Visible skin appears normal and pt's usual skin color HEENT:  EOMI, head is normocephalic and atraumatic.  Sclera are anicteric. Neck has a good range of motion.  Lips are noncyanotic Chest: normal chest excursion and movement Respiratory: speaking in full sentences, no conversational dyspnea; no use of accessory muscles Psych: insight good, mood- appears full

## 2019-05-12 NOTE — ED Notes (Signed)
Patient able to ambulate independently  

## 2019-05-12 NOTE — ED Provider Notes (Signed)
[facility]    CSN: [id number] Arrival date & time: 05/12/19  1607      History   Chief Complaint Chief Complaint  Patient presents with  . COVID-Like Symptoms    HPI Joy Williams is a 68 y.o. female with history of allergies, asthma, MVP, lung cancer, Raynaud's syndrome, telangiectasia presenting for in-person evaluation of requested PCP, with whom patient had virtual visit earlier today.  Please see progress notes from 12/23 which were reviewed by me at time of appointment.  Patient underwent PCR Covid testing yesterday: Not yet resulted.  Has had 2-day course of nasal congestion, myalgias, fever (T-max 100.29F yesterday).  Patient does not take anything for symptoms.  States that she sometimes has a cough, but this is dry, without hemoptysis, dyspnea, or chest pain.  Patient does not appear to be concerned about URI symptoms, largely fever.  States she is compliant with her home medications including Dulera.  Denies increased inhaler use at home.  No known sick contacts.    Past Medical History:  Diagnosis Date  . Allergy   . Asthma    pt denies this, but is on Dulera  . Chronic diastolic congestive heart failure ([place])   . 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 COPD (chronic obstructive pulmonary disease) ([place])   . Family history of adverse reaction to anesthesia    Mother- very sensitive to medication  . Heart murmur    MVP  . Hypothyroidism   . 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 MVP (mitral valve prolapse)   . PONV (postoperative nausea and vomiting)   . Raynaud's disease    Raynaud's  . Raynaud's syndrome   . 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 Severe mitral regurgitation 11/15/2014  . Shortness of breath dyspnea    with exertion  . STD (sexually transmitted disease)   . Telangiectasia   . Thyroid disease     Patient Active Problem List   Diagnosis Date Noted  . History of TIA (transient ischemic attack) 05/10/2019  . Non-ST elevated myocardial infarction ([place]) 02/25/2019  . NSTEMI (non-ST elevated myocardial infarction) ([place]) 02/25/2019  . Elevated troponin level not due to acute coronary syndrome   . 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 On statin therapy 04/27/2018  . Memory loss or impairment 04/27/2018  . Complicated migraine 47/01/6282  . Hyperlipidemia LDL goal <70 03/12/2018  . 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 Stroke-like episode ([place]) s/p tPA 03/11/2018  . Dysuria 10/14/2017  . Abnormal finding on urinalysis 10/14/2017  . Pharyngitis 10/14/2017  . Closed nondisplaced fracture of base of fifth metacarpal bone of right hand 05/27/2017  . Decreased range of motion of right ankle 05/27/2017  . Pain and swelling of right wrist 05/27/2017  . Age-related osteoporosis without current pathological fracture 02/20/2017  . ASCUS of cervix with negative high risk HPV 01/29/2017  . 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 Tobacco use disorder 01/22/2017  . Tobacco abuse counseling 01/22/2017  .  Neoplastic malignant related fatigue 01/22/2017  . Vitamin D insufficiency 01/22/2017  . Estrogen deficiency 01/14/2017  . H/O cardiac catheterization- in prep for lung ca sx 01/06/2017  . Hypothyroidism 01/06/2017  . Previous back surgeries 01/06/2017  . 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 Chronic diastolic congestive heart failure ([place])   . Chronic obstructive pulmonary  disease ([place])   . 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 Antineoplastic chemotherapy induced anemia 05/05/2015  . History of epistaxis 03/30/2015  . Raynaud's disease 01/19/2015  . Malignant neoplasm of left lung ([place]) 12/30/2014  . Lung cancer ([place]) 12/30/2014  . Malignant neoplasm of overlapping sites of left lung ([place]) 12/06/2014  . Nonintractable episodic headache 12/06/2014  . Severe mitral regurgitation 11/15/2014  . 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 Asthmatic bronchitis , chronic ([place]) 10/03/2014  . Multiple pulmonary nodules 10/03/2014  . Current Cigarette smoker 10/03/2014    Past Surgical History:  Procedure Laterality Date  . BACK SURGERY     x 3  Disectomy  . BREAST BIOPSY Left   . CARDIAC CATHETERIZATION N/A 03/08/2016   Procedure: Right/Left Heart Cath and Coronary Angiography;  Surgeon: Sherren Mocha, MD;  Location: [place] [facility];  Service: Cardiovascular;  Laterality: N/A;  . CLAVICLE SURGERY Left 2013   plate to left collar bone  . LAPAROSCOPY     ? reason-age 79   . LEFT HEART CATH AND CORONARY ANGIOGRAPHY N/A 02/25/2019   Procedure: LEFT HEART CATH AND CORONARY ANGIOGRAPHY;  Surgeon: Jettie Booze, MD;  Location: [place] [facility];  Service: Cardiovascular;  Laterality: N/A;  . LUNG CANCER SURGERY    . MITRAL VALVE REPAIR Right 06/27/2016   Procedure: MINIMALLY INVASIVE MITRAL VALVE REPLACEMENT;  Surgeon: Rexene Alberts, MD;  Location: [place];  Service: Open Heart Surgery;  Laterality: Right;  . TEE WITHOUT CARDIOVERSION N/A 02/22/2016   Procedure: TRANSESOPHAGEAL ECHOCARDIOGRAM (TEE);  Surgeon: Dorothy Spark, MD;  Location: [place];  Service: Cardiovascular;  Laterality: N/A;  . TEE WITHOUT CARDIOVERSION N/A 06/27/2016   Procedure: TRANSESOPHAGEAL ECHOCARDIOGRAM (TEE);  Surgeon: Rexene Alberts, MD;  Location: [place];  Service: Open Heart Surgery;  Laterality: N/A;  . TONSILLECTOMY    . VIDEO ASSISTED THORACOSCOPY (VATS)/WEDGE RESECTION Left  12/30/2014   Bronchoscopy, Mediastinoscopy, Left VATS for Wedge resection LUL x2 adn LLL x1 - Dr. Elenor Quinones at [facility]  . VOCAL CORD INJECTION Left 2017   injected with botox  . wrist surgery Left 2015   plate to wrist     OB History    Gravida  3   Para  2   Term  2   Preterm      AB  1   Living  2     SAB      TAB      Ectopic      Multiple      Live Births  2            Home Medications    Prior to Admission medications   Medication Sig Start Date End Date Taking? Authorizing Provider  acetaminophen (TYLENOL) 500 MG tablet Take 1,000 mg by mouth every 6 (six) hours as needed for mild pain or headache.    [provider]  aspirin EC 81 MG tablet Take 1 tablet (81 mg total) by mouth daily. 03/24/19   Dorothy Spark, MD  atorvastatin (LIPITOR) 20 MG tablet Take 1 tablet (20 mg total) by mouth daily  at 6 PM. 10/22/18   Dorothy Spark, MD  azelastine (ASTELIN) 0.1 % nasal spray Place 2 sprays into both nostrils 2 (two) times daily. Use in each nostril as directed 11/05/18   Opalski, Neoma Laming, DO  Cholecalciferol (VITAMIN D-3) 125 MCG (5000 UT) TABS Take 5,000 Units by mouth daily.    [provider]  clopidogrel (PLAVIX) 75 MG tablet Take 1 tablet (75 mg total) by mouth daily. 09/15/18   Frann Rider, NP  Fluticasone-Umeclidin-Vilant (TRELEGY ELLIPTA) 100-62.5-25 MCG/INH AEPB Inhale 1 puff into the lungs daily. 07/09/18   Tanda Rockers, MD  levothyroxine (SYNTHROID) 175 MCG tablet TAKE 1 TABLET DAILY BEFORE BREAKFAST. 05/10/19   Opalski, Shelah, DO  lisinopril (ZESTRIL) 5 MG tablet Take 0.5 tablets (2.5 mg total) by mouth daily. 03/03/19 06/01/19  Richardson Dopp T, PA-C  Multiple Vitamins-Minerals (MULTIVITAMIN WITH MINERALS) tablet Take 1 tablet by mouth daily.    [provider]  naproxen sodium (ALEVE) 220 MG tablet Take 220 mg by mouth as needed (pain).    [provider]  nitroGLYCERIN (NITROSTAT) 0.4 MG SL tablet Place 1  tablet (0.4 mg total) under the tongue every 5 (five) minutes as needed for chest pain. 03/01/19   Richardson Dopp T, PA-C  Turmeric 500 MG CAPS Take 500 mg by mouth daily.    [provider]  Vitamin D, Ergocalciferol, (DRISDOL) 1.25 MG (50000 UT) CAPS capsule Take 1 capsule (50,000 Units total) by mouth every 7 (seven) days. 04/21/19   Mellody Dance, DO    Family History Family History  Problem Relation Age of Onset  . Mitral valve prolapse Mother   . Dementia Father   . Prostate cancer Father   . Mitral valve prolapse Brother   . Mitral valve prolapse Sister   . Colon cancer Neg Hx   . Colon polyps Neg Hx   . Rectal cancer Neg Hx   . Esophageal cancer Neg Hx   . Stomach cancer Neg Hx   . Heart attack Neg Hx     Social History Social History   Tobacco Use  . Smoking status: Current Every Day Smoker    Packs/day: 0.25    Years: 24.00    Pack years: 6.00    Types: E-cigarettes, Cigarettes  . Smokeless tobacco: Never Used  Substance Use Topics  . Alcohol use: Yes    Alcohol/week: 2.0 standard drinks    Types: 1 Shots of liquor, 1 Standard drinks or equivalent per week  . Drug use: No     Allergies   Penicillins   Review of Systems Review of Systems  Constitutional: Positive for fatigue. Negative for activity change, appetite change and fever.       T-max 100.33F yesterday, self-limited  HENT: Positive for congestion. Negative for ear pain, sinus pain, sore throat and voice change.   Eyes: Negative for pain, redness and visual disturbance.  Respiratory: Positive for cough and chest tightness. Negative for shortness of breath and wheezing.        Chest tightness occurs only when coughing  Cardiovascular: Negative for chest pain, palpitations and leg swelling.  Gastrointestinal: Negative for abdominal pain, diarrhea, nausea and vomiting.  Musculoskeletal: Positive for myalgias. Negative for arthralgias, back pain, neck pain and neck stiffness.  Skin: Negative  for rash and wound.  Neurological: Negative for syncope and headaches.     Physical Exam Triage Vital Signs ED Triage Vitals  Enc Vitals Group     BP  Pulse      Resp      Temp      Temp src      SpO2      Weight      Height      Head Circumference      Peak Flow      Pain Score      Pain Loc      Pain Edu?      Excl. in [place]?    No data found.  Updated Vital Signs BP (!) 154/79 (BP Location: Left Arm)   Pulse 73   Temp 98.9 F (37.2 C) (Temporal) Comment: Tylenol/Ibuprofen at 1230  Resp 18   SpO2 97%   Visual Acuity Right Eye Distance:   Left Eye Distance:   Bilateral Distance:    Right Eye Near:   Left Eye Near:    Bilateral Near:     Physical Exam Constitutional:      General: She is not in acute distress.    Appearance: She is normal weight. She is not ill-appearing or diaphoretic.  HENT:     Head: Normocephalic and atraumatic.     Right Ear: Tympanic membrane, ear canal and external ear normal.     Left Ear: Tympanic membrane, ear canal and external ear normal.     Nose: Nose normal.     Mouth/Throat:     Mouth: Mucous membranes are moist.     Pharynx: Oropharynx is clear.  Eyes:     General: No scleral icterus.    Conjunctiva/sclera: Conjunctivae normal.     Pupils: Pupils are equal, round, and reactive to light.  Neck:     Comments: Trachea midline, negative JVD Cardiovascular:     Rate and Rhythm: Normal rate and regular rhythm.  Pulmonary:     Effort: Pulmonary effort is normal. No respiratory distress.     Breath sounds: No stridor. No wheezing, rhonchi or rales.     Comments: Good air entry bilaterally without prolonged expiratory phase Chest:     Chest wall: No tenderness.  Abdominal:     General: Bowel sounds are normal.     Tenderness: There is no abdominal tenderness.  Musculoskeletal:        General: Normal range of motion.     Cervical back: Neck supple. No tenderness.     Right lower leg: No edema.     Left lower leg: No  edema.  Lymphadenopathy:     Cervical: No cervical adenopathy.  Skin:    Capillary Refill: Capillary refill takes less than 2 seconds.     Coloration: Skin is not jaundiced or pale.  Neurological:     General: No focal deficit present.     Mental Status: She is alert and oriented to person, place, and time.  Psychiatric:        Mood and Affect: Mood normal.        Behavior: Behavior normal.      UC Treatments / Results  Labs (all labs ordered are listed, but only abnormal results are displayed) Labs Reviewed - No data to display  EKG   Radiology No results found.  Procedures Procedures (including critical care time)  Medications Ordered in UC Medications - No data to display  Initial Impression / Assessment and Plan / UC Course  I have reviewed the triage vital signs and the nursing notes.  Pertinent labs & imaging results that were available during my care of the patient were reviewed by  me and considered in my medical decision making (see chart for details).     Patient afebrile, nontoxic, SpO2 97%, and  W/o neurocognitive deficit on exam.  Discussed utility of rapid influenza testing as patient could qualify for Tamiflu: Patient declined.  PCR pending: Patient to continue quarantining.  Offered checks x-ray despite lack of cardiopulmonary abnormalities on exam given PCPs concern: Patient declined.  Will continue supportive treatment as outlined below.  Return precautions discussed, patient verbalized understanding and is agreeable to plan. Final Clinical Impressions(s) / UC Diagnoses   Final diagnoses:  Viral URI with cough     Discharge Instructions     Important to be quarantine at home while your Covid test results are pending. May alternate between Tylenol, ibuprofen as needed for fever, malaise, body aches. Important to drink plenty of water throughout the day and reintroduce moisture to the air.  This can be achieved by humidifier, boiling pot of water,  letting hot shower run, etc. Return for worsening cough, development of shortness of breath, chest pain, persistent/worsening fevers, or fevers that are not controlled by OTC medications.    ED Prescriptions    None     PDMP not reviewed this encounter.   [place], [place], [place] 05/13/19 1711

## 2019-05-13 ENCOUNTER — Encounter: Payer: Self-pay | Admitting: Emergency Medicine

## 2019-05-13 LAB — NOVEL CORONAVIRUS, NAA: SARS-CoV-2, NAA: NOT DETECTED

## 2019-05-17 ENCOUNTER — Telehealth: Payer: Self-pay | Admitting: *Deleted

## 2019-05-17 MED ORDER — METOPROLOL TARTRATE 25 MG PO TABS: 25.0000 mg | ORAL_TABLET | Freq: Two times a day (BID) | ORAL | 1 refills | Status: DC

## 2019-05-17 NOTE — Telephone Encounter (Signed)
-----   Message from Dorothy Spark, MD sent at 05/17/2019 11:23 AM EST ----- Please start her on metoprolol 25 mg p.o. twice daily.

## 2019-05-17 NOTE — Telephone Encounter (Signed)
Spoke with the pt and informed her that based on her monitor results, Dr. Meda Coffee recommends that she start taking metoprolol tartrate 25 mg po bid.  Confirmed the pharmacy of choice with the pt.  Informed the pt that we will see her as planned in Feb.  Pt verbalized understanding and agrees with this plan.

## 2019-05-19 ENCOUNTER — Ambulatory Visit: Payer: Medicare Other | Admitting: Family Medicine

## 2019-05-23 ENCOUNTER — Other Ambulatory Visit: Payer: Self-pay | Admitting: Physician Assistant

## 2019-05-24 ENCOUNTER — Telehealth: Payer: Self-pay | Admitting: Family Medicine

## 2019-05-24 MED ORDER — FLUTICASONE PROPIONATE 50 MCG/ACT NA SUSP: 2.0000 | Freq: Every day | NASAL | 0 refills | Status: DC

## 2019-05-24 NOTE — Telephone Encounter (Signed)
Patient is requesting a refill of her Flonase, if approved please send to [facility] on [address].

## 2019-05-24 NOTE — Telephone Encounter (Signed)
Flonase sent to pharmacy. AS, CMA

## 2019-05-24 NOTE — Addendum Note (Signed)
Addended by: Mickel Crow on: 05/24/2019 09:23 AM   Modules accepted: Orders

## 2019-06-10 ENCOUNTER — Telehealth: Payer: Self-pay | Admitting: Internal Medicine

## 2019-06-10 DIAGNOSIS — C3482 Malignant neoplasm of overlapping sites of left bronchus and lung: Secondary | ICD-10-CM | POA: Diagnosis not present

## 2019-06-10 DIAGNOSIS — Z87891 Personal history of nicotine dependence: Secondary | ICD-10-CM | POA: Diagnosis not present

## 2019-06-10 DIAGNOSIS — C3492 Malignant neoplasm of unspecified part of left bronchus or lung: Secondary | ICD-10-CM | POA: Diagnosis not present

## 2019-06-10 NOTE — Telephone Encounter (Signed)
Patient scheduled for PFT and OV with Dr. Melvyn Novas on 08/05/19. The Chest CT is in Care Everywhere dated 06/10/19.  Called the patient back to advise Dr. Melvyn Novas would be able to access the report and if imaging is needed, she will have to sign a medical release so it can be sent to [facility] to obtain the CD if they have the imaging information available.  Patient voiced understanding. Nothing further needed at this time.

## 2019-06-24 ENCOUNTER — Encounter: Payer: Self-pay | Admitting: Cardiology

## 2019-06-24 ENCOUNTER — Ambulatory Visit (INDEPENDENT_AMBULATORY_CARE_PROVIDER_SITE_OTHER): Payer: Medicare Other | Admitting: Cardiology

## 2019-06-24 ENCOUNTER — Other Ambulatory Visit: Payer: Self-pay

## 2019-06-24 VITALS — BP 122/70 | HR 55 | Ht 67.0 in | Wt 134.8 lb

## 2019-06-24 DIAGNOSIS — I214 Non-ST elevation (NSTEMI) myocardial infarction: Secondary | ICD-10-CM | POA: Diagnosis not present

## 2019-06-24 DIAGNOSIS — E785 Hyperlipidemia, unspecified: Secondary | ICD-10-CM | POA: Diagnosis not present

## 2019-06-24 NOTE — Progress Notes (Signed)
Cardiology Office Note:    Date:  06/24/2019   ID:  Joy Williams, DOB Dec 30, 1950, MRN [id number]  PCP:  Mellody Dance, DO  Cardiologist:  Ena Dawley, MD   Electrophysiologist:  None   Referring MD: Mellody Dance, DO   Reason for visit: 3 months follow-up  History of Present Illness:    Joy Williams is a 69 y.o. female with:   MVP with mitral regurgitation   S/p min invasive bioprosthetic MVR in 06/2016 (Dr. Roxy Manns)  Cath prior to MVR without CAD  Carotid artery Dz  [place] 2018: bilat ICA 40-59  [place] 10/19: bilat ICA 6-64  Diastolic CHF  Hx of TIA  Lung CA (2 primary tumors)  S/p resection; adjuvant chemoTx; managed at [facility]  Asthmatic bronchitis   Raynaud's Disease  Pt notes ?hx of CREST but ultimately ruled out  Hypothyroidism   Tobacco use  Joy Williams was last seen by Dr. Meda Coffee in 10/2018 via Telemedicine.  She was recently admitted 10/7-10/8 with chest discomfort.  High-sensitivity troponin levels were elevated (1471 >> 1625 >> 1246 >> 1150).  There was concern for non-ST elevation myocardial infarction.  However, cardiac catheterization demonstrated no CAD.  Echocardiogram demonstrated normal LV function and normally functioning mitral valve prosthesis.  She was followed by Dr. Debara Pickett in the hospital and he felt that her presentation was c/w [facility] (myocardial infarction with non-obstructive coronary arteries) or non-ACS troponin elevation.  The possible causes included myocarditis, coronary spasm, cardioembolic event.  Recommendation was to proceed with cardiac MRI and follow-up thereafter.  However, she has not yet had a cardiac MRI.  03/24/2019, she underwent cardiac MRI that showed focal subendocardial LGE into mid inferolateral wall consistent with infarct seen on territory.  She has no further chest pain or shortness of breath.  Has occasional tendency lasting palpitations.  4 weeks Zio patch monitor in November 2020  Sinus bradycardia to sinus  tachycardia.  No atrial fibrillation.  3 runs of nonsustained ventricular tachycardia's with longest lasting 7 beats.  15 very short runs of SVTs, most probably atrial tachycardia.   06/24/2019 -she is coming after 3 months, she has been doing great, she is tolerating her medications, the only side effect is orthostatic hypotension when she gets up, denies any falls, no palpitation dizziness or syncope.  No lower extremity edema no orthopnea or proximal nocturnal dyspnea.   Prior CV studies:   The following studies were reviewed today:  Cardiac catheterization 02/25/2019 Normal coronary arteries EF 55-65   Echocardiogram 02/25/2019 EF 60-65, mildly increased LV posterior wall thickness, normal RV SF, mild to moderate LAE, MVR with trace MR and no evidence of mitral stenosis, trivial TR, trivial PI  Echo 11/12/2018 EF 60-65, mild LVH, normal wall motion, normal RVSF, bioprosthetic mitral valve with trivial MR and no significant stenosis (mean gradient 10 mmHg), mild dilation of aortic root (37 mm), moderate LAE, possible PFO by color Doppler  Carotid US 03/13/2018 Summary: Right Carotid: Velocities in the right ICA are consistent with a 1-39% stenosis. Left Carotid: Velocities in the left ICA are consistent with a 1-39% stenosis. Vertebrals: Bilateral vertebral arteries demonstrate antegrade flow.  Echo 03/12/2018 Moderate to severe LVH, EF 55-60, normal wall motion, grade 2 diastolic dysfunction, normally functioning mitral valve prosthesis with trivial stenosis (2 mmHg), moderate to severe LAE, trivial TR  Past Medical History:  Diagnosis Date  . Allergy   . Asthma    pt denies this, but is on Dulera  . Chronic diastolic congestive  heart failure ([place])   . 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 COPD (chronic obstructive pulmonary disease) ([place])   . Family history of adverse  reaction to anesthesia    Mother- very sensitive to medication  . Heart murmur    MVP  . Hypothyroidism   . 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 MVP (mitral valve prolapse)   . PONV (postoperative nausea and vomiting)   . Raynaud's disease    Raynaud's  . Raynaud's syndrome   . 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   31 mm [facility] mitral bovine bioprosthetic tissue valve placed via right mini thoracotomy approach  . Severe mitral regurgitation 11/15/2014  . Shortness of breath dyspnea    with exertion  . STD (sexually transmitted disease)   . Telangiectasia   . Thyroid disease    Surgical Hx: The patient  has a past surgical history that includes Back surgery; Clavicle surgery (Left, 2013); wrist surgery (Left, 2015); Tonsillectomy; laparoscopy; TEE without cardioversion (N/A, 02/22/2016); Video assisted thoracoscopy (vats)/wedge resection (Left, 12/30/2014); Cardiac catheterization (N/A, 03/08/2016); Mitral valve repair (Right, 06/27/2016); TEE without cardioversion (N/A, 06/27/2016); Lung cancer surgery; Vocal cord injection (Left, 2017); Breast biopsy (Left); and LEFT HEART CATH AND CORONARY ANGIOGRAPHY (N/A, 02/25/2019).   Current Medications: Current Meds  Medication Sig  . acetaminophen (TYLENOL) 500 MG tablet Take 1,000 mg by mouth every 6 (six) hours as needed for mild pain or headache.  Marland Kitchen aspirin EC 81 MG tablet Take 1 tablet (81 mg total) by mouth daily.  Marland Kitchen atorvastatin (LIPITOR) 20 MG tablet Take 1 tablet (20 mg total) by mouth daily at 6 PM.  . azelastine (ASTELIN) 0.1 % nasal spray Place 2 sprays into both nostrils 2 (two) times daily. Use in each nostril as directed  . Cholecalciferol (VITAMIN D-3) 125 MCG (5000 UT) TABS Take 5,000 Units by mouth daily.  . clopidogrel (PLAVIX) 75 MG tablet  Take 1 tablet (75 mg total) by mouth daily.  . fluticasone (FLONASE) 50 MCG/ACT nasal spray Place 2 sprays into both nostrils daily.  . Fluticasone-Umeclidin-Vilant (TRELEGY ELLIPTA) 100-62.5-25 MCG/INH AEPB Inhale 1 puff into the lungs daily.  Marland Kitchen levothyroxine (SYNTHROID) 175 MCG tablet TAKE 1 TABLET DAILY BEFORE BREAKFAST.  . metoprolol tartrate (LOPRESSOR) 25 MG tablet Take 1 tablet (25 mg total) by mouth 2 (two) times daily.  . Multiple Vitamins-Minerals (MULTIVITAMIN WITH MINERALS) tablet Take 1 tablet by mouth daily.  . naproxen sodium (ALEVE) 220 MG tablet Take 220 mg by mouth as needed (pain).  . nitroGLYCERIN (NITROSTAT) 0.4 MG SL tablet Place 1 tablet (0.4 mg total) under the tongue every 5 (five) minutes as needed for chest pain.  . Turmeric 500 MG CAPS Take 500 mg by mouth daily.  . Vitamin D, Ergocalciferol, (DRISDOL) 1.25 MG (50000 UT) CAPS capsule Take 1 capsule (50,000 Units total) by mouth every 7 (seven) days.  . [DISCONTINUED] lisinopril (ZESTRIL) 5 MG tablet TAKE 1/2 TABLET BY MOUTH DAILY     Allergies:   Penicillins   Social History   Tobacco Use  . Smoking status: Current Every Day Smoker    Packs/day: 0.25    Years: 24.00    Pack years: 6.00  Types: E-cigarettes, Cigarettes  . Smokeless tobacco: Never Used  Substance Use Topics  . Alcohol use: Yes    Alcohol/week: 2.0 standard drinks    Types: 1 Shots of liquor, 1 Standard drinks or equivalent per week  . Drug use: No     Family Hx: The patient's family history includes Dementia in her father; Mitral valve prolapse in her brother, mother, and sister; Prostate cancer in her father. There is no history of Colon cancer, Colon polyps, Rectal cancer, Esophageal cancer, Stomach cancer, or Heart attack.  ROS:   Please see the history of present illness.    ROS All other systems reviewed and are negative.   EKGs/Labs/Other Test Reviewed:    EKG:  EKG is  ordered today.  The ekg ordered today demonstrates  normal sinus rhythm, HR 63, leftward axis, non-specific ST-TW changes, QTc 476  Recent Labs: 11/11/2018: TSH 1.790 02/24/2019: ALT 31 02/25/2019: Hemoglobin 12.1; Platelets 209 03/17/2019: BUN 13; Creatinine, Ser 0.64; Potassium 4.3; Sodium 134   Recent Lipid Panel Lab Results  Component Value Date/Time   CHOL 133 02/25/2019 05:28 AM   CHOL 150 11/11/2018 09:46 AM   CHOL 177 10/28/2014 10:25 AM   TRIG 74 02/25/2019 05:28 AM   TRIG 115 10/28/2014 10:25 AM   HDL 61 02/25/2019 05:28 AM   HDL 70 11/11/2018 09:46 AM   HDL 70 10/28/2014 10:25 AM   CHOLHDL 2.2 02/25/2019 05:28 AM   LDLCALC 57 02/25/2019 05:28 AM   LDLCALC 64 11/11/2018 09:46 AM   LDLCALC 84 10/28/2014 10:25 AM   LDLDIRECT 68 11/11/2018 09:46 AM    Physical Exam:    VS:  BP 122/70   Pulse (!) 55   Ht 5\' 7"  (1.702 m)   Wt 134 lb 12.8 oz (61.1 kg)   SpO2 98%   BMI 21.11 kg/m     Wt Readings from Last 3 Encounters:  06/24/19 134 lb 12.8 oz (61.1 kg)  05/12/19 135 lb (61.2 kg)  05/10/19 135 lb (61.2 kg)    Physical Exam  Constitutional: She is oriented to person, place, and time. She appears well-developed and well-nourished. No distress.  HENT:  Head: Normocephalic and atraumatic.  Eyes: No scleral icterus.  Neck: No JVD present. No thyromegaly present.  Cardiovascular: Normal rate, regular rhythm and normal heart sounds.  No murmur heard. Pulmonary/Chest: Effort normal and breath sounds normal. She has no rales.  Abdominal: Soft. There is no hepatomegaly.  Musculoskeletal:        General: No edema.     Comments: R wrist without hematoma  Lymphadenopathy:    She has no cervical adenopathy.  Neurological: She is alert and oriented to person, place, and time.  Skin: Skin is warm and dry.  Psychiatric: She has a normal mood and affect.    ASSESSMENT & PLAN:    1. NSTEMI (non-ST elevated myocardial infarction) [facility]) Patient is status post recent admission with substernal chest pain and elevated troponin  levels.  However, there was no CAD on cardiac catheterization. MRI showed focal LGE in the mid inferolateral wall highly suspicious for thromboembolic event. 14-day Zio patch monitor showed Sinus bradycardia to sinus tachycardia. No atrial fibrillation. 3 runs of nonsustained ventricular tachycardia's with longest lasting 7 beats. 15 very short runs of SVTs, most probably atrial tachycardia.  She denies any palpitations.  She was started on metoprolol.  She is having episodes of orthostatic hypotension, will discontinue lisinopril her LVEF was 60 to 65%, we will continue aspirin and  Plavix, considering prior history of stroke we will discontinue aspirin in October 2020 and continue Plavix, she is explained necessity of continuing atorvastatin, she is tolerating it well and her lipids are excellent.  We will refer to cardiac rehab.  2. Mitral valve insufficiency, 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 Recent echocardiogram with normally functioning mitral valve prosthesis with only trivial residual mitral regurgitation, continue SBE prophylaxis.  3. Bilateral carotid artery stenosis Bilateral ICA stenosis 1-39 by carotid US in October 2019.  Continue clopidogrel, statin therapy.  4. Hyperlipidemia LDL goal <70 LDL optimal on most recent lab work.  Continue current Rx.    Dispo: Follow-up in 6 months.  We will obtain labs including CBC, CMP, TSH and lipids prior to that.  Medication Adjustments/Labs and Tests Ordered: Current medicines are reviewed at length with the patient today.  Concerns regarding medicines are outlined above.  Tests Ordered: Orders Placed This Encounter  Procedures  . CBC with Differential/Platelet  . Comprehensive metabolic panel  . Lipid panel  . TSH  . AMB referral to cardiac rehabilitation   Medication Changes: No orders of the defined types were placed in this encounter.  Signed, Ena Dawley, MD  06/24/2019 11:20 AM    [facility] [place], [place], [place]  [address] Phone: [phone]; Fax: [phone]

## 2019-06-24 NOTE — Patient Instructions (Signed)
Medication Instructions:  1) STOP LISINOPRIL *If you need a refill on your cardiac medications before your next appointment, please call your pharmacy*  Lab Work: You will need to have fasting labs drawn prior to your next appointment in 6 months. If you have labs (blood work) drawn today and your tests are completely normal, you will receive your results only by: Marland Kitchen MyChart Message (if you have MyChart) OR . A paper copy in the mail If you have any lab test that is abnormal or we need to change your treatment, we will call you to review the results.  Follow-Up: At [facility], you and your health needs are our priority.  As part of our continuing mission to provide you with exceptional heart care, we have created designated Provider Care Teams.  These Care Teams include your primary Cardiologist (physician) and Advanced Practice Providers (APPs -  Physician Assistants and Nurse Practitioners) who all work together to provide you with the care you need, when you need it. Your next appointment:   6 month(s) The format for your next appointment:   In Person Provider:   You may see Ena Dawley, MD or one of the following Advanced Practice Providers on your designated Care Team:    Melina Copa, PA-C  Ermalinda Barrios, PA-C

## 2019-06-27 ENCOUNTER — Ambulatory Visit: Payer: Medicare Other

## 2019-07-02 ENCOUNTER — Telehealth (HOSPITAL_COMMUNITY): Payer: Self-pay

## 2019-07-02 NOTE — Telephone Encounter (Signed)
Cardiac Rehab Note:  Unsuccessful telephone encounter to Adalene Gulotta to discuss recent cardiac rehab referral and orientation scheduling s/p MI 02/2019. [employer] compliant VM message left requesting call back to [place]. Rollene Rotunda RN, BSN [place]. [facility]  Cardiac and Pulmonary Rehabilitation [place] Direct: [phone]

## 2019-07-07 ENCOUNTER — Ambulatory Visit
Admission: EM | Admit: 2019-07-07 | Discharge: 2019-07-07 | Disposition: A | Discharge status: Home / Self Care | Payer: Medicare Other | Attending: Emergency Medicine | Admitting: Emergency Medicine

## 2019-07-07 DIAGNOSIS — M79674 Pain in right toe(s): Secondary | ICD-10-CM | POA: Diagnosis not present

## 2019-07-07 MED ORDER — DOXYCYCLINE HYCLATE 100 MG PO CAPS: 100.0000 mg | ORAL_CAPSULE | Freq: Two times a day (BID) | ORAL | 0 refills | Status: AC

## 2019-07-07 NOTE — ED Triage Notes (Signed)
Pt c/o a sore to back of rt 4th toe x7days. Denies injury.

## 2019-07-07 NOTE — Discharge Instructions (Addendum)
Try epsom salt soaks. Keep area clean & dry/covered. Take antibiotic as prescribed Return for worsening pain, injury, discoloration (more red or black), fever.

## 2019-07-07 NOTE — ED Provider Notes (Signed)
[facility]    CSN: [id number] Arrival date & time: 07/07/19  0847      History   Chief Complaint Chief Complaint  Patient presents with  . Wound Check    HPI Joy Williams is a 69 y.o. female significant medical history including COPD, CAD, hypothyroidism, Raynaud's presenting for 1 week course of pain and swelling to right fourth toe.  Denies injury, preceding wound.  States she has used Neosporin, Aquaphor with minimal relief.  Has had increasing tenderness to palpation for the last 2 days.  Denies recent change in shoes or activity level.  No fever, arthralgias, myalgias, open wound or discharge.   Past Medical History:  Diagnosis Date  . Allergy   . Asthma    pt denies this, but is on Dulera  . Chronic diastolic congestive heart failure ([place])   . 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 COPD (chronic obstructive pulmonary disease) ([place])   . Family history of adverse reaction to anesthesia    Mother- very sensitive to medication  . Heart murmur    MVP  . Hypothyroidism   . 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 MVP (mitral valve prolapse)   . PONV (postoperative nausea and vomiting)   . Raynaud's disease    Raynaud's  . Raynaud's syndrome   . 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 Severe mitral regurgitation 11/15/2014  . Shortness of breath dyspnea    with exertion  . STD (sexually transmitted disease)   . Telangiectasia   . Thyroid disease     Patient Active Problem List   Diagnosis Date Noted  . History of TIA (transient ischemic attack) 05/10/2019  . Non-ST  elevated myocardial infarction ([place]) 02/25/2019  . NSTEMI (non-ST elevated myocardial infarction) ([place]) 02/25/2019  . Elevated troponin level not due to acute coronary syndrome   . 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 On statin therapy 04/27/2018  . Memory loss or impairment 04/27/2018  . Complicated migraine 56/43/3295  . Hyperlipidemia LDL goal <70 03/12/2018  . 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 Stroke-like episode ([place]) s/p tPA 03/11/2018  . Dysuria 10/14/2017  . Abnormal finding on urinalysis 10/14/2017  . Pharyngitis 10/14/2017  . Closed nondisplaced fracture of base of fifth metacarpal bone of right hand 05/27/2017  . Decreased range of motion of right ankle 05/27/2017  . Pain and swelling of right wrist 05/27/2017  . Age-related osteoporosis without current pathological fracture 02/20/2017  . ASCUS of cervix with negative high risk HPV 01/29/2017  . 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 Tobacco use disorder 01/22/2017  . Tobacco abuse counseling 01/22/2017  . Neoplastic malignant related fatigue 01/22/2017  . Vitamin D insufficiency 01/22/2017  . Estrogen deficiency 01/14/2017  . H/O cardiac catheterization- in prep for lung ca sx 01/06/2017  . Hypothyroidism 01/06/2017  . Previous back surgeries 01/06/2017  . 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 Chronic diastolic congestive heart failure ([place])   .  Chronic obstructive pulmonary disease ([place])   . 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 Antineoplastic chemotherapy induced anemia 05/05/2015  . History of epistaxis 03/30/2015  . Raynaud's disease 01/19/2015  . Malignant neoplasm of left lung ([place]) 12/30/2014  . Lung cancer ([place]) 12/30/2014  . Malignant neoplasm of overlapping sites of left lung ([place]) 12/06/2014  . Nonintractable episodic headache 12/06/2014    . Severe mitral regurgitation 11/15/2014  . Scleroderma ([place]) 10/03/2014  . Asthmatic bronchitis , chronic ([place]) 10/03/2014  . Multiple pulmonary nodules 10/03/2014  . Current Cigarette smoker 10/03/2014    Past Surgical History:  Procedure Laterality Date  . BACK SURGERY     x 3  Disectomy  . BREAST BIOPSY Left   . CARDIAC CATHETERIZATION N/A 03/08/2016   Procedure: Right/Left Heart Cath and Coronary Angiography;  Surgeon: Sherren Mocha, MD;  Location: [facility];  Service: Cardiovascular;  Laterality: N/A;  . CLAVICLE SURGERY Left 2013   plate to left collar bone  . LAPAROSCOPY     ? reason-age 59   . LEFT HEART CATH AND CORONARY ANGIOGRAPHY N/A 02/25/2019   Procedure: LEFT HEART CATH AND CORONARY ANGIOGRAPHY;  Surgeon: Jettie Booze, MD;  Location: [facility];  Service: Cardiovascular;  Laterality: N/A;  . LUNG CANCER SURGERY    . MITRAL VALVE REPAIR Right 06/27/2016   Procedure: MINIMALLY INVASIVE MITRAL VALVE REPLACEMENT;  Surgeon: Rexene Alberts, MD;  Location: [facility];  Service: Open Heart Surgery;  Laterality: Right;  . TEE WITHOUT CARDIOVERSION N/A 02/22/2016   Procedure: TRANSESOPHAGEAL ECHOCARDIOGRAM (TEE);  Surgeon: Dorothy Spark, MD;  Location: [facility];  Service: Cardiovascular;  Laterality: N/A;  . TEE WITHOUT CARDIOVERSION N/A 06/27/2016   Procedure: TRANSESOPHAGEAL ECHOCARDIOGRAM (TEE);  Surgeon: Rexene Alberts, MD;  Location: [facility];  Service: Open Heart Surgery;  Laterality: N/A;  . TONSILLECTOMY    . VIDEO ASSISTED THORACOSCOPY (VATS)/WEDGE RESECTION Left 12/30/2014   Bronchoscopy, Mediastinoscopy, Left VATS for Wedge resection LUL x2 adn LLL x1 - Dr. Elenor Quinones at [facility]  . VOCAL CORD INJECTION Left 2017   injected with botox  . wrist surgery Left 2015   plate to wrist     OB History    Gravida  3   Para  2   Term  2   Preterm      AB  1   Living  2     SAB      TAB      Ectopic      Multiple      Live Births  2             Home Medications    Prior to Admission medications   Medication Sig Start Date End Date Taking? Authorizing Provider  acetaminophen (TYLENOL) 500 MG tablet Take 1,000 mg by mouth every 6 (six) hours as needed for mild pain or headache.    [provider]  aspirin EC 81 MG tablet Take 1 tablet (81 mg total) by mouth daily. 03/24/19   Dorothy Spark, MD  atorvastatin (LIPITOR) 20 MG tablet Take 1 tablet (20 mg total) by mouth daily at 6 PM. 10/22/18   Dorothy Spark, MD  azelastine (ASTELIN) 0.1 % nasal spray Place 2 sprays into both nostrils 2 (two) times daily. Use in each nostril as directed 11/05/18   Opalski, Neoma Laming, DO  Cholecalciferol (VITAMIN D-3) 125 MCG (5000 UT) TABS Take 5,000 Units by mouth daily.    [provider]  clopidogrel (PLAVIX) 75 MG tablet Take 1 tablet (75 mg total) by mouth daily. 09/15/18   Frann Rider, NP  doxycycline (VIBRAMYCIN) 100 MG capsule Take 1 capsule (100 mg total) by mouth 2 (two) times daily for 5 days. 07/07/19 07/12/19  Hall-Potvin, [place], PA-C  fluticasone (FLONASE) 50 MCG/ACT nasal spray Place 2 sprays into both nostrils daily. 05/24/19   Opalski, Neoma Laming, DO  Fluticasone-Umeclidin-Vilant (TRELEGY ELLIPTA) 100-62.5-25 MCG/INH AEPB Inhale 1 puff into the lungs daily. 07/09/18   Tanda Rockers, MD  levothyroxine (SYNTHROID) 175 MCG tablet TAKE 1 TABLET DAILY BEFORE BREAKFAST. 05/10/19   Opalski, Neoma Laming, DO  metoprolol tartrate (LOPRESSOR) 25 MG tablet Take 1 tablet (25 mg total) by mouth 2 (two) times daily. 05/17/19   Dorothy Spark, MD  Multiple Vitamins-Minerals (MULTIVITAMIN WITH MINERALS) tablet Take 1 tablet by mouth daily.    [provider]  naproxen sodium (ALEVE) 220 MG tablet Take 220 mg by mouth as needed (pain).    [provider]  nitroGLYCERIN (NITROSTAT) 0.4 MG SL tablet Place 1 tablet (0.4 mg total) under the tongue every 5 (five) minutes as needed for chest pain. 03/01/19   Richardson Dopp T, PA-C  Turmeric 500 MG CAPS Take 500 mg by mouth daily.    [provider]  Vitamin D, Ergocalciferol, (DRISDOL) 1.25 MG (50000 UT) CAPS capsule Take 1 capsule (50,000 Units total) by mouth every 7 (seven) days. 04/21/19   Mellody Dance, DO    Family History Family History  Problem Relation Age of Onset  . Mitral valve prolapse Mother   . Dementia Father   . Prostate cancer Father   . Mitral valve prolapse Brother   . Mitral valve prolapse Sister   . Colon cancer Neg Hx   . Colon polyps Neg Hx   . Rectal cancer Neg Hx   . Esophageal cancer Neg Hx   . Stomach cancer Neg Hx   . Heart attack Neg Hx     Social History Social History   Tobacco Use  . Smoking status: Current Every Day Smoker    Packs/day: 0.25    Years: 24.00    Pack years: 6.00    Types: E-cigarettes, Cigarettes  . Smokeless tobacco: Never Used  Substance Use Topics  . Alcohol use: Yes    Alcohol/week: 2.0 standard drinks    Types: 1 Shots of liquor, 1 Standard drinks or equivalent per week  . Drug use: No     Allergies   Penicillins   Review of Systems As per HPI   Physical Exam Triage Vital Signs ED Triage Vitals  Enc Vitals Group     BP      Pulse      Resp      Temp      Temp src      SpO2      Weight      Height      Head Circumference      Peak Flow      Pain Score      Pain Loc      Pain Edu?      Excl. in [place]?    No data found.  Updated Vital Signs BP 128/88 (BP Location: Left Arm)   Pulse 68   Temp 97.7 F (36.5 C) (Oral)   Resp 18   SpO2 96%   Visual Acuity Right Eye Distance:   Left Eye Distance:   Bilateral Distance:    Right Eye Near:  Left Eye Near:    Bilateral Near:     Physical Exam Constitutional:      General: She is not in acute distress.    Appearance: She is not ill-appearing.  HENT:     Head: Normocephalic and atraumatic.  Eyes:     General: No scleral icterus.    Pupils: Pupils are equal, round, and reactive to light.    Cardiovascular:     Rate and Rhythm: Normal rate.  Pulmonary:     Effort: Pulmonary effort is normal. No respiratory distress.     Breath sounds: No wheezing.  Musculoskeletal:        General: Swelling and tenderness present. Normal range of motion.     Comments: 4th right toe, distal aspect  Skin:    Capillary Refill: Capillary refill takes less than 2 seconds.     Coloration: Skin is not jaundiced or pale.     Findings: Erythema present. No bruising.     Comments: Exquisite TTP  Neurological:     Mental Status: She is alert and oriented to person, place, and time.      UC Treatments / Results  Labs (all labs ordered are listed, but only abnormal results are displayed) Labs Reviewed - No data to display  EKG   Radiology No results found.  Procedures Procedures (including critical care time)  Medications Ordered in UC Medications - No data to display  Initial Impression / Assessment and Plan / UC Course  I have reviewed the triage vital signs and the nursing notes.  Pertinent labs & imaging results that were available during my care of the patient were reviewed by me and considered in my medical decision making (see chart for details).     Patient afebrile, nontoxic in office today.  Significant TTP with moderate edema without inciting event.  Low concern for gout was patient is not on diuretic, recently seen by her cardiologist without change in medications, denies history thereof.  Patient does have Raynaud's, which could be contributory to lack of warmth on palpation, and lead to poor healing in distal extremities: We will start doxycycline and Epson salt soaks today, have patient follow-up with PCP for persistent, worsening symptoms.  Return precautions discussed, patient verbalized understanding and is agreeable to plan. Final Clinical Impressions(s) / UC Diagnoses   Final diagnoses:  Pain of toe of right foot     Discharge Instructions     Try epsom salt  soaks. Keep area clean & dry/covered. Take antibiotic as prescribed Return for worsening pain, injury, discoloration (more red or black), fever.    ED Prescriptions    Medication Sig Dispense Auth. Provider   doxycycline (VIBRAMYCIN) 100 MG capsule Take 1 capsule (100 mg total) by mouth 2 (two) times daily for 5 days. 10 capsule Hall-Potvin, [place], PA-C     PDMP not reviewed this encounter.   Hall-Potvin, [place], [place] 07/07/19 [phone]

## 2019-07-09 ENCOUNTER — Encounter (HOSPITAL_COMMUNITY): Payer: Self-pay | Admitting: *Deleted

## 2019-07-09 NOTE — Progress Notes (Signed)
Letter sent to mailing address listed on file regarding scheduling cardiac rehab.  Pt has 2 weeks to respond.  If no response, pt referral will be closed. Cherre Huger, BSN Cardiac and Training and development officer

## 2019-07-12 ENCOUNTER — Encounter: Payer: Self-pay | Admitting: Family Medicine

## 2019-07-14 ENCOUNTER — Encounter: Payer: Self-pay | Admitting: Family Medicine

## 2019-07-14 ENCOUNTER — Ambulatory Visit (INDEPENDENT_AMBULATORY_CARE_PROVIDER_SITE_OTHER): Payer: Medicare Other | Admitting: Family Medicine

## 2019-07-14 ENCOUNTER — Ambulatory Visit: Payer: Medicare Other

## 2019-07-14 ENCOUNTER — Other Ambulatory Visit: Payer: Self-pay

## 2019-07-14 DIAGNOSIS — I7300 Raynaud's syndrome without gangrene: Secondary | ICD-10-CM

## 2019-07-14 DIAGNOSIS — C3482 Malignant neoplasm of overlapping sites of left bronchus and lung: Secondary | ICD-10-CM

## 2019-07-14 DIAGNOSIS — M79674 Pain in right toe(s): Secondary | ICD-10-CM

## 2019-07-14 DIAGNOSIS — S90414D Abrasion, right lesser toe(s), subsequent encounter: Secondary | ICD-10-CM

## 2019-07-14 DIAGNOSIS — D8989 Other specified disorders involving the immune mechanism, not elsewhere classified: Secondary | ICD-10-CM | POA: Insufficient documentation

## 2019-07-14 DIAGNOSIS — L089 Local infection of the skin and subcutaneous tissue, unspecified: Secondary | ICD-10-CM

## 2019-07-14 MED ORDER — DOXYCYCLINE HYCLATE 100 MG PO TABS: 100.0000 mg | ORAL_TABLET | Freq: Two times a day (BID) | ORAL | 0 refills | Status: DC

## 2019-07-14 NOTE — Progress Notes (Signed)
Telehealth office visit note for Joy Williams, D.O- at Primary Care at [facility]   I connected with current patient today and verified that I am speaking with the correct person using two identifiers.   . Location of the patient: Home . Location of the provider: Office Only the patient (+/- their family members at pt's discretion) and myself were participating in the encounter - This visit type was conducted due to national recommendations for restrictions regarding the COVID-19 Pandemic (e.g. social distancing) in an effort to limit this patient's exposure and mitigate transmission in our community.  This format is felt to be most appropriate for this patient at this time.   - No physical exam could be performed with this format, beyond that communicated to [place] by the patient/ family members as noted.   - Additionally my office staff/ schedulers discussed with the patient that there may be a monetary charge related to this service, depending on their medical insurance.   The patient expressed understanding, and agreed to proceed.       History of Present Illness:   I, Joy Williams, am serving as Education administrator for [employer].     Patient went to [facility] on 07/07/2019:  HPI from Urgent Care: Joy Williams is a 69 y.o. female significant medical history including COPD, CAD, hypothyroidism, Raynaud's presenting for 1 week course of pain and swelling to right fourth toe.  Denies injury, preceding wound.  States she has used Neosporin, Aquaphor with minimal relief.  Has had increasing tenderness to palpation for the last 2 days.  Denies recent change in shoes or activity level.  No fever, arthralgias, myalgias, open wound or discharge.  - Infected Abrasion of Right Fourth Toe  Notes she was told to go to Urgent Care one week ago, was placed on an antibiotic.  Says they have "had me soaking my foot in epsom salts."  Says "the pain level I would say is 7 of 10."  She  notes "it's a wound."  Says "it looks like it's gotten better, but it's still very sensitive to the touch, so I have to be very careful what shoes I wear; big flat tennis shoes are a lot better than anything else."  Notes they did not obtain an X-ray at Urgent Care.    Says "I wish I could say that I had stubbed it or done something to have exacerbated what it is, and the answer is no."  Her symptoms began a week prior to her Urgent Care visit.  Says "I could feel something going on."  She used Aquaphor and Neosporin, "and it just didn't do its job at all."  Says "I can't tell if it's getting better or the same."  Feels the wound itself looks better, but it's still there, still painful.  Noticed sensitivity on that side on February 8th.  "I could tell that there was a bit of an abrasion of the skin."  Says "that's when I started using the Aquaphor and Neosporin, and it just would not do its thing; it would not heal, and it got worse and swollen and very painful."  Says "I don't know whether because of my Raynaud's, I don't have the best blood flow all the way down there, and so I'm just looking for some next steps."  She wonders if she needs more antibiotic, more epsom salts, etc.  Overall, feels her wound is improving, but just not as fast as she  would like.  "I would have expected that the pain would have gone away."  Says "to the touch and to the walking, it just hasn't, it's still there."  Regarding redness in the area, "I can still see the redness."  As for the abrasion itself, "I feel like it's healing."  Says it's "still swollen and still very sensitive to the touch."  It is painful to walk on the area.  Confirms that the wound is located on the outside and underside of the fourth digit.  I saw pictures which pt sent---- see message from today for details  The wound has become less deep, she thinks between the soaking and antibiotic.  Denies fevers.   - COVID-19 Vaccination She had her  second dose of COVID-19 vaccine yesterday.  Notes her arm is very sore, but "I'm not feeling overly tired, I don't have a headache, I don't have aches, so I think I lucked out."    GAD 7 : Generalized Anxiety Score 11/16/2018 11/05/2018 01/22/2017  Nervous, Anxious, on Edge 0 0 0  Control/stop worrying 0 0 0  Worry too much - different things 0 0 0  Trouble relaxing 0 0 0  Restless 0 0 0  Easily annoyed or irritable 0 0 0  Afraid - awful might happen 0 0 0  Total GAD 7 Score 0 0 0  Anxiety Difficulty Not difficult at all Not difficult at all Not difficult at all    Depression screen [facility] 2/9 07/14/2019 11/16/2018 11/05/2018 04/27/2018 03/17/2018  Decreased Interest 0 0 0 0 1  Down, Depressed, Hopeless 0 0 0 0 0  PHQ - 2 Score 0 0 0 0 1  Altered sleeping 0 0 0 0 0  Tired, decreased energy 0 0 0 0 2  Change in appetite 0 0 0 0 0  Feeling bad or failure about yourself  0 0 0 0 0  Trouble concentrating 0 0 0 0 1  Moving slowly or fidgety/restless 0 0 0 0 1  Suicidal thoughts 0 0 0 0 0  PHQ-9 Score 0 0 0 0 5  Difficult doing work/chores Not difficult at all Not difficult at all Not difficult at all Not difficult at all Somewhat difficult      Impression and Recommendations:    1. Infected abrasion of toe of right foot, subsequent encounter   2. Toe pain, right   3. Raynaud's disease without gangrene   4. Malignant neoplasm of overlapping sites of left lung ([place])   5. 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)      Infected Abrasion of Toe of R Foot, Subsequent Encounter - Reviewed patient's symptoms extensively during appointment today.  - Discussed that given her concurrent health, medical issues such as Raynaud's, long h/o smoking and current, lung cancer etc, need to treat the area as though the patient is a diabetic.  - Encouraged patient to engage in Dial soap soaks of the wound. - Advised patient to soak the area 3-4 times per day for 15 minutes with Dial antibiotic  soap in warm water, and pat the area dry.  Leave the area open to air as much as possible.  - Reviewed appropriate use of doxycycline with patient today. - Since the wound is improving,  I rec 10d of ABX txtmnt total--->  was only given 5 d in [facility] ONE WEEK AGO-hence no meds for 2 days - new scipt given for doxycycline twice daily 10 days.  See med list.   -  Aware of potential s-e of ABX ie- yeast infection etc. patient already eats yogurt daily.  - Recommended X-rays through [facility] near future. -Called patient back at [phone] and gave her additional address and contact information via a message through [place]. - Discussed that if abnormalities are appreciated on X-ray, or patient shows no improvement after 10 days of antibiotics and soaking as prescribed, will evaluate further to rule out osteomyelitis.  Reviewed that MRI may be obtained in future for in-depth evaluation.  - Education provided regarding wound care and expectations for wound healing. - Advised patient that she should continue to slowly improve, and if not, further assessment is warranted. - If the area does not continue to slowly improve, or worsens at any time, patient knows to call the clinic and let us know at once.  - Will continue to monitor.  - Red flag symptoms and signs discussed in detail.  Patient expressed understanding regarding what to do in case of emergency\ urgent symptoms.   - The patient was advised to call back or seek an in-person evaluation if the symptoms worsen or if the condition fails to improve as anticipated.   Recommendations - Return for Medicare Wellness near future.    - As part of my medical decision making, I reviewed the following data within the [place] History obtained from pt /family, CMA notes reviewed and incorporated if applicable, Labs reviewed, Radiograph/ tests reviewed if applicable and OV notes from prior OV's with me, as well as other specialists  she/he has seen since seeing me last, were all reviewed and used in my medical decision making process today.    - Additionally, discussion had with patient regarding our treatment plan, and their biases/concerns about that plan were used in my medical decision making today.    - The patient agreed with the plan and demonstrated an understanding of the instructions.   No barriers to understanding were identified.   Return for [employer] near future, labs in June.    Orders Placed This Encounter  Procedures  . DG Toe 4th Right    Meds ordered this encounter  Medications  . doxycycline (VIBRA-TABS) 100 MG tablet    Sig: Take 1 tablet (100 mg total) by mouth 2 (two) times daily.    Dispense:  20 tablet    Refill:  0    There are no discontinued medications.    I provided 32 minutes of non face-to-face time during this encounter.  Additional time was spent with charting and coordination of care before and after the actual visit commenced.   Note:  This note was prepared with assistance of Dragon voice recognition software. Occasional wrong-word or sound-a-like substitutions may have occurred due to the inherent limitations of voice recognition software.  This document serves as a record of services personally performed by Joy Dance, DO. It was created on her behalf by Joy Williams, a trained medical scribe. The creation of this record is based on the scribe's personal observations and the provider's statements to them.   This case required medical decision making of at least moderate complexity. The above documentation from Joy Williams, medical scribe, has been reviewed by Joy Williams, D.O.     Patient Care Team    Relationship Specialty Notifications Start End  Joy Dance, DO PCP - General Family Medicine  01/06/17   Dorothy Spark, MD PCP - Cardiology Cardiology Admissions 02/25/19   Dorothy Spark, MD Consulting Physician Cardiology  01/06/17   Tanda Rockers, MD Consulting Physician Pulmonary Disease  01/06/17   Hurley Cisco, MD Consulting Physician Rheumatology  01/06/17   Lerry Paterson, MD Referring Physician   01/06/17    Comment: lung ca Surgeon  Lissa Morales, MD Referring Physician Internal Medicine  01/06/17    Comment: Oncologist- Chemo doc  [name], MD Consulting Physician Infectious Diseases  03/17/18    Comment: + RPR test     -Vitals obtained; medications/ allergies reconciled;  personal medical, social, Sx etc.histories were updated by CMA, reviewed by me and are reflected in chart   Patient Active Problem List   Diagnosis Date Noted  . Hyperlipidemia LDL goal <70 03/12/2018  . Tobacco use disorder 01/22/2017  . Tobacco abuse counseling 01/22/2017  . Chronic obstructive pulmonary disease ([place])   . 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 Malignant neoplasm of left lung ([place]) 12/30/2014  . Malignant neoplasm of overlapping sites of left lung ([place]) 12/06/2014  . Scleroderma ([place]) 10/03/2014  . Age-related osteoporosis without current pathological fracture 02/20/2017  . Estrogen deficiency 01/14/2017  . H/O cardiac catheterization- in prep for lung ca sx 01/06/2017  . Hypothyroidism 01/06/2017  . 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 Chronic diastolic congestive heart failure ([place])   . Asthmatic bronchitis , chronic ([place]) 10/03/2014  . Current Cigarette smoker 10/03/2014  . 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 Neoplastic malignant related fatigue 01/22/2017  . Vitamin D insufficiency 01/22/2017  . Previous back surgeries 01/06/2017  . Antineoplastic chemotherapy induced anemia 05/05/2015  . Raynaud's disease 01/19/2015  . Severe mitral regurgitation 11/15/2014  . Multiple pulmonary nodules 10/03/2014  . Suppression of immune system - subtherapeutic ([place])  07/14/2019  . History of TIA (transient ischemic attack) 05/10/2019  . Non-ST elevated myocardial infarction ([place]) 02/25/2019  . NSTEMI (non-ST elevated myocardial infarction) ([place]) 02/25/2019  . Elevated troponin level not due to acute coronary syndrome   . 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 On statin therapy 04/27/2018  . Memory loss or impairment 04/27/2018  . Complicated migraine 42/70/6237  . 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 Stroke-like episode ([place]) s/p tPA 03/11/2018  . Dysuria 10/14/2017  . Abnormal finding on urinalysis 10/14/2017  . Pharyngitis 10/14/2017  . Closed nondisplaced fracture of base of fifth metacarpal bone of right hand 05/27/2017  . Decreased range of motion of right ankle 05/27/2017  . Pain and swelling of right wrist 05/27/2017  . ASCUS of cervix with negative high risk HPV 01/29/2017  . History of epistaxis 03/30/2015  . Lung cancer ([place]) 12/30/2014  . Nonintractable episodic headache 12/06/2014     Current Meds  Medication Sig  . acetaminophen (TYLENOL) 500 MG tablet Take 1,000 mg by mouth every 6 (six) hours as needed for mild pain or headache.  Marland Kitchen aspirin EC 81 MG tablet Take 1 tablet (81 mg total) by mouth daily.  Marland Kitchen atorvastatin (LIPITOR) 20 MG tablet Take 1 tablet (20 mg total) by mouth daily at 6 PM.  . azelastine (ASTELIN) 0.1 % nasal spray Place 2 sprays into both nostrils 2 (two) times daily. Use in each nostril as directed  . Cholecalciferol (VITAMIN D-3) 125 MCG (5000 UT) TABS Take 5,000 Units by mouth daily.  . clopidogrel (PLAVIX) 75 MG tablet Take 1 tablet (75 mg total) by mouth daily.  . fluticasone (FLONASE) 50 MCG/ACT  nasal spray Place 2 sprays into both nostrils daily.  . Fluticasone-Umeclidin-Vilant (TRELEGY ELLIPTA) 100-62.5-25 MCG/INH AEPB Inhale 1 puff into the lungs daily.  Marland Kitchen levothyroxine (SYNTHROID) 175 MCG tablet TAKE 1 TABLET DAILY BEFORE BREAKFAST.  . metoprolol tartrate  (LOPRESSOR) 25 MG tablet Take 1 tablet (25 mg total) by mouth 2 (two) times daily.  . Multiple Vitamins-Minerals (MULTIVITAMIN WITH MINERALS) tablet Take 1 tablet by mouth daily.  . naproxen sodium (ALEVE) 220 MG tablet Take 220 mg by mouth as needed (pain).  . nitroGLYCERIN (NITROSTAT) 0.4 MG SL tablet Place 1 tablet (0.4 mg total) under the tongue every 5 (five) minutes as needed for chest pain.  . Turmeric 500 MG CAPS Take 500 mg by mouth daily.  . Vitamin D, Ergocalciferol, (DRISDOL) 1.25 MG (50000 UT) CAPS capsule Take 1 capsule (50,000 Units total) by mouth every 7 (seven) days.     Allergies:  Allergies  Allergen Reactions  . Penicillins Hives    Unknown, occurred as a child Has patient had a PCN reaction causing immediate rash, facial/tongue/throat swelling, SOB or lightheadedness with hypotension: Yes Has patient had a PCN reaction causing severe rash involving mucus membranes or skin necrosis: No Has patient had a PCN reaction that required hospitalization  Has patient had a PCN reaction occurring within the last 10 years: No If all of the above answers are "NO", then may proceed with Cephalosporin use. No     ROS:  See above HPI for pertinent positives and negatives   Objective:   There were no vitals taken for this visit.  (if some vitals are omitted, this means that patient was UNABLE to obtain them even though they were asked to get them prior to OV today.  They were asked to call us at their earliest convenience with these once obtained. )  General: A & O * 3; sounds in no acute distress; in usual state of health.  Skin: Pt confirms warm and dry extremities and pink fingertips HEENT: Pt confirms lips non-cyanotic Chest: Patient confirms normal chest excursion and movement Respiratory: speaking in full sentences, no conversational dyspnea; patient confirms no use of accessory muscles Psych: insight appears good, mood- appears full

## 2019-07-16 ENCOUNTER — Ambulatory Visit
Admission: RE | Admit: 2019-07-16 | Discharge: 2019-07-16 | Disposition: A | Discharge status: Home / Self Care | Payer: Medicare Other | Source: Ambulatory Visit | Attending: Family Medicine | Admitting: Family Medicine

## 2019-07-16 DIAGNOSIS — M79674 Pain in right toe(s): Secondary | ICD-10-CM | POA: Diagnosis not present

## 2019-07-16 DIAGNOSIS — M7989 Other specified soft tissue disorders: Secondary | ICD-10-CM | POA: Diagnosis not present

## 2019-07-20 ENCOUNTER — Telehealth: Payer: Self-pay | Admitting: Internal Medicine

## 2019-07-20 MED ORDER — TRELEGY ELLIPTA 100-62.5-25 MCG/INH IN AEPB: 1.0000 | INHALATION_SPRAY | Freq: Every day | RESPIRATORY_TRACT | 1 refills | Status: DC

## 2019-07-20 NOTE — Telephone Encounter (Signed)
Called and spoke with Patient.  Patient stated she needed a Trelegy refill sent to [facility]. Patient is scheduled 08/05/19 for PFT, and OV with Dr. Melvyn Novas.  Trelegy refill sent to requested pharmacy. Nothing further at this time.

## 2019-08-02 ENCOUNTER — Other Ambulatory Visit (HOSPITAL_COMMUNITY)
Admission: RE | Admit: 2019-08-02 | Discharge: 2019-08-02 | Disposition: A | Discharge status: Home / Self Care | Payer: Medicare Other | Source: Ambulatory Visit | Attending: Internal Medicine | Admitting: Internal Medicine

## 2019-08-02 DIAGNOSIS — Z01812 Encounter for preprocedural laboratory examination: Secondary | ICD-10-CM | POA: Insufficient documentation

## 2019-08-02 DIAGNOSIS — Z20822 Contact with and (suspected) exposure to covid-19: Secondary | ICD-10-CM | POA: Insufficient documentation

## 2019-08-02 LAB — SARS CORONAVIRUS 2 (TAT 6-24 HRS): SARS Coronavirus 2: NEGATIVE

## 2019-08-04 ENCOUNTER — Other Ambulatory Visit: Payer: Self-pay | Admitting: Internal Medicine

## 2019-08-04 DIAGNOSIS — J449 Chronic obstructive pulmonary disease, unspecified: Secondary | ICD-10-CM

## 2019-08-05 ENCOUNTER — Other Ambulatory Visit: Payer: Self-pay

## 2019-08-05 ENCOUNTER — Encounter: Payer: Self-pay | Admitting: Internal Medicine

## 2019-08-05 ENCOUNTER — Ambulatory Visit (INDEPENDENT_AMBULATORY_CARE_PROVIDER_SITE_OTHER): Payer: Medicare Other | Admitting: Internal Medicine

## 2019-08-05 DIAGNOSIS — J449 Chronic obstructive pulmonary disease, unspecified: Secondary | ICD-10-CM

## 2019-08-05 DIAGNOSIS — M349 Systemic sclerosis, unspecified: Secondary | ICD-10-CM | POA: Diagnosis not present

## 2019-08-05 LAB — PULMONARY FUNCTION TEST
DL/VA % pred: 87 %
DL/VA: 3.55 ml/min/mmHg/L
DLCO cor % pred: 64 %
DLCO cor: 13.77 ml/min/mmHg
DLCO unc % pred: 64 %
DLCO unc: 13.77 ml/min/mmHg
FEF 25-75 Post: 2.03 L/sec
FEF 25-75 Pre: 2.02 L/sec
FEF2575-%Change-Post: 0 %
FEF2575-%Pred-Post: 94 %
FEF2575-%Pred-Pre: 94 %
FEV1-%Change-Post: 1 %
FEV1-%Pred-Post: 80 %
FEV1-%Pred-Pre: 79 %
FEV1-Post: 2.06 L
FEV1-Pre: 2.04 L
FEV1FVC-%Change-Post: 1 %
FEV1FVC-%Pred-Pre: 105 %
FEV6-%Change-Post: 0 %
FEV6-%Pred-Post: 77 %
FEV6-%Pred-Pre: 77 %
FEV6-Post: 2.5 L
FEV6-Pre: 2.52 L
FEV6FVC-%Change-Post: 0 %
FEV6FVC-%Pred-Post: 103 %
FEV6FVC-%Pred-Pre: 104 %
FVC-%Change-Post: 0 %
FVC-%Pred-Post: 74 %
FVC-%Pred-Pre: 74 %
FVC-Post: 2.52 L
FVC-Pre: 2.52 L
Post FEV1/FVC ratio: 82 %
Post FEV6/FVC ratio: 99 %
Pre FEV1/FVC ratio: 81 %
Pre FEV6/FVC Ratio: 100 %
RV % pred: 74 %
RV: 1.69 L
TLC % pred: 77 %
TLC: 4.22 L

## 2019-08-05 NOTE — Assessment & Plan Note (Addendum)
Active smoker  -12/16/2012  s sign obst or even concavity to f/v loop  - 10/03/2014   try dulera 100 2bid  - PFTs 11/14/14 on dulera FEV1  2.33 (86%) ratio 78% and no change p saba and dlco 52% corrects to 65%  - Spirometry 06/07/2015  FEV1 1.74 (67%)  Ratio 76   PFT's   03/12/16   FEV1 2.09 (78 % ) ratio 0.80  p 0 % improvement from saba p ? prior to study with DLCO  48/53c % corrects to 84  % for alv volume  And no curvature  - 05/25/2018  After extensive coaching inhaler device,  effectiveness =    75% > changed to advair 115 hfa by insurance but preferred symb - 07/09/2018  After extensive coaching inhaler device,  effectiveness =    90% with elipta so rec trial of trelegy  -  .PFT's  08/05/2019  FEV1 2.06 (80 % ) ratio 0.82  p 1 % improvement from saba p nothing prior to study with DLCO  13.77 (64%) corrects to 3.55 (87%)  for alv volume and FV curve min concavity    Group D in terms of symptom/risk and laba/lama/ICS  therefore appropriate rx at this point >>>  Continue trelegy though note she has normalized her pfts so technically doesn't have any copd at this point almost 6 months p stop smoking  > 3 min discussion I reviewed the Fletcher curve with the patient that basically indicates  if you quit smoking when your best day FEV1 is still well preserved (as is clearly  the case here)  it is highly unlikely you will progress to severe disease and informed the patient there was  no medication on the market that has proven to alter the curve/ its downward trajectory  or the likelihood of progression of their disease(unlike other chronic medical conditions such as atheroclerosis where we do think we can change the natural hx with risk reducing meds)    Therefore  Maintaining cig abstinence is  the most important aspects of her care, not choice of inhalers or for that matter, doctors.   Treatment other than smoking cessation  is entirely directed by severity of symptoms and focused also on reducing  exacerbations, not attempting to change the natural history of the disease.     >>> f/u can be q 12 months with option of step down to BREO 100 if doing great or if insurance requesting.           Each maintenance medication was reviewed in detail including emphasizing most importantly the difference between maintenance and prns and under what circumstances the prns are to be triggered using an action plan format where appropriate.  Total time for H and P, chart review, counseling, reviewing device and generating customized AVS unique to this office visit / charting = 20 min

## 2019-08-05 NOTE — Progress Notes (Signed)
Subjective:    Patient ID: Joy Williams, female    DOB: [birth year]    MRN: [id number]    Brief patient profile:  36  yowf  Quit smoking 02/2019   dx with CREST 2004 in [place] moved to [place] from [place] Nov 2015  self referred to pulmonary clinic 10/03/14  for copd though last set of pfts from 2014 did not show copd    History of Present Illness  10/03/2014 1st [facility] office visit/ Joy Williams  On spiriva chronically  Chief Complaint  Patient presents with  . Pulmonary Consult    Self referral. Pt moved from [place] to [facility] in Nov 2015. She states that she was dxed with Crest Syndrome in her 75's or 30's.  She c/o "smoker's cough"- proc with clear sputum.  Her breathing is overall doing well.    cough is the main problem - can still do 5k racing power walk s stopping ie Not limited by breathing from desired activities  Takes fish oil for heart regurgitation and it "works great"  Cough is daily , def Worse in am's year round, present x > 5 y, worse since in [place]  rec Stop spiriva and start >>> Dulera 100 Take 2 puffs first thing in am and then another 2 puffs about 12 hours later.  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 Surgical resection with wedge resection and mediastinal lymph node sampling 12/2014 Records suggest  pneumonectomy done but clearly this is an error      06/07/2015  Extended f/u ov/Joy Williams re: transition of care  sob s/p L lung resection maint rx dulera 100 2bid  Chief Complaint  Patient presents with  . Follow-up    Pt c/o increased DOE since had lung surgery for lung ca in Sept 2016.   doe x  MMRC2 = can't walk a nl pace on a flat grade s sob/ assoc with day > noct cough/ no assoc dysphagia/ has not had any RT  rec For cough > mucinex dm 1200 mg every 12 hours as needed Continue dulera 100 Take 2 puffs first thing in am and then another 2 puffs about 12 hours later.  Work on inhaler technique:  The key is to stop smoking completely before smoking completely stops you!  Try prilosec otc 56m  Take 30-60 min before first meal of the day and Pepcid ac (famotidine) 20 mg one @  bedtime until cough is completely gone for at least a week without the need for cough suppression GERD diet    09/17/2016  f/u ov/Joy Williams re: AB on dulera 100 2bid but insurance no longer covering  Chief Complaint  Patient presents with  . Follow-up    Pt states her breathing is doing well. She is requesting refill on Dulera. She states she has AM cough  if she forgets to take the PM dose of Dulera. She does not have an albuterol inhaler.   working toward a 5 k this month s/p  MVR 06/27/16 rec  Work on inhaler technique:  Continue symbicort 80 Take 2 puffs first thing in am and then another 2 puffs about 12 hours  lateer    05/25/2018  f/u ov/Joy Williams re: AB  Was on symb 80 2 bid doing ok  Chief Complaint  Patient presents with  . Follow-up    Insurance stopped covering symbicort and so she has been out of med x 4 days. She states her breathing has been "okay".  She does not have a rescue inhaler.   Dyspnea:  Not limited by breathing from desired activities  As long as on symb Cough: not a problem Sleeping: fine bed flat  SABA use: none 02: none  rec Advair 115  Take 2 puffs first thing in am and then another 2 puffs  about 12 hours later.  If you are not satisfied for any reason, please return your drug formulary to pick an alternative   07/09/2018  f/u ov/Joy Williams re:  Copd 0/ AB  Chief Complaint  Patient presents with  . Follow-up    discuss inhalers per 2/10 phone note.  advair is not working as well as the [employer].    Dyspnea:  More sob on advair vs symbicort Cough: some worse on advair/ dry hacking  Sleeping: up at least once cough SABA use: never uses  02: none  rec Stop advair and start trelegy one click each am x 2 good drags and use arm and hammer tooth paste and gargle and spit out If not happy with trelegy then call for PA on symbicort The key is to stop smoking completely before smoking completely stops you!     08/05/2019  f/u ov/Joy Williams re: AB /  Quit smoking oct  2020/ planning to  Begin  pulmonary rehab/ last covid 2 weeks prior to [place] Complaint  Patient presents with  . Follow-up    F/U after PFT. States her breathing has been stable since last visit. Will be starting pulmonary rehab soon.   Dyspnea:  trelegy has been the best rx yet now = MMRC1 = can walk nl pace, flat grade, can't hurry or go uphills or steps s sob   Cough: not now that quit smoking  Sleeping: better now s cough or sob  SABA use: none  02: none    No obvious day to day or daytime variability or assoc excess/ purulent sputum or mucus plugs or hemoptysis or cp or chest tightness, subjective wheeze or overt sinus or hb symptoms.   Sleeping  without nocturnal  or early am exacerbation  of respiratory  c/o's or need for noct saba. Also denies any obvious fluctuation of symptoms with weather or environmental changes or other aggravating or alleviating factors except as outlined above   No unusual exposure hx or h/o childhood pna/ asthma or knowledge of premature birth.  Current Allergies, Complete Past Medical History, Past Surgical History, Family History, and Social History were reviewed in [employer] record.  ROS  The following are not active complaints unless bolded Hoarseness, sore throat, dysphagia, dental problems, itching, sneezing,  nasal congestion or discharge of excess mucus or purulent secretions, ear ache,   fever, chills, sweats, unintended wt loss or wt gain, classically pleuritic or exertional cp,  orthopnea pnd or arm/hand swelling  or  leg swelling, presyncope, palpitations, abdominal pain, anorexia, nausea, vomiting, diarrhea  or change in bowel habits or change in bladder habits, change in stools or change in urine, dysuria, hematuria,  rash, arthralgias, visual complaints, headache, numbness, weakness or ataxia or problems with walking or coordination,  change in mood or  memory.        Current Meds  Medication Sig  . acetaminophen (TYLENOL) 500 MG tablet Take 1,000 mg by mouth every 6 (six) hours as needed for mild pain or headache.  Marland Kitchen aspirin EC 81 MG tablet Take 1 tablet (81 mg total) by mouth daily.  Marland Kitchen atorvastatin (LIPITOR) 20 MG tablet Take 1 tablet (20 mg total) by mouth daily at 6 PM.  . azelastine (ASTELIN) 0.1 % nasal spray Place 2 sprays into both nostrils 2 (two) times daily. Use in each nostril as directed  . Cholecalciferol (VITAMIN D-3) 125 MCG (5000 UT) TABS Take 5,000 Units by mouth daily.  . clopidogrel (PLAVIX) 75 MG tablet Take 1 tablet (75 mg total) by mouth daily.  . fluticasone (FLONASE) 50 MCG/ACT nasal spray Place 2 sprays into both nostrils daily.  . Fluticasone-Umeclidin-Vilant (TRELEGY ELLIPTA) 100-62.5-25 MCG/INH AEPB Inhale 1 puff into the lungs daily.  Marland Kitchen levothyroxine (SYNTHROID) 175 MCG tablet TAKE 1 TABLET DAILY BEFORE BREAKFAST.  . metoprolol tartrate (LOPRESSOR) 25 MG tablet Take 1 tablet (25 mg total) by mouth 2 (two) times daily.  . Multiple Vitamins-Minerals (MULTIVITAMIN WITH MINERALS) tablet Take 1 tablet by mouth daily.  . nitroGLYCERIN (NITROSTAT) 0.4 MG SL tablet Place 1 tablet (0.4 mg total) under the tongue every  5 (five) minutes as needed for chest pain.  . Turmeric 500 MG CAPS Take 500 mg by mouth daily.  . Vitamin D, Ergocalciferol, (DRISDOL) 1.25 MG (50000 UT) CAPS capsule Take 1 capsule (50,000 Units total) by mouth every 7 (seven) days.                      Objective:   Physical Exam     08/05/2019       134  07/09/2018       133  05/25/2018         134  09/17/2016         129  06/07/2015       134   11/14/14 127 lb (57.607 kg)  10/28/14 125 lb (56.7 kg)  10/03/14 128 lb (58.06 kg)    Vital signs reviewed  08/05/2019  - Note at rest 02 sats  95% on RA         HEENT : pt wearing mask not removed for exam due to covid - 19 concerns.   NECK :  without JVD/Nodes/TM/ nl carotid upstrokes bilaterally   LUNGS: no acc muscle use,  Min barrel  contour chest wall with bilateral  slightly decreased bs s audible wheeze and  without cough on insp or exp maneuvers and min  Hyperresonant  to  percussion bilaterally     CV:  RRR  no s3 or murmur or increase in P2, and no edema   ABD:  soft and nontender with pos end  insp Hoover's  in the supine position. No bruits or organomegaly appreciated, bowel sounds nl  MS:   Nl gait/  ext warm without deformities, calf tenderness, cyanosis or clubbing No obvious joint restrictions   SKIN: warm and dry without lesions    NEURO:  alert, approp, nl sensorium with  no motor or cerebellar deficits apparent.  I  reviewed  radiology impression as follows:   Chest CT 06/10/19 at [facility] in care everywhere: 1. Interval increase in subpleural consolidation in the left lower lobe adjacent to resection margin. Differential considerations include confluent fibrosis, superimposed infection, or adenocarcinoma. Recommend attention on 3 month follow-up CT or as per clinical protocol.  2. 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                Assessment & Plan:

## 2019-08-05 NOTE — Assessment & Plan Note (Signed)
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 - CT chest 06/10/19 no change UIP pattern   No evidence of dz progression/ f/u yearly is appropriate/advised

## 2019-08-05 NOTE — Progress Notes (Signed)
PFT done today. 

## 2019-08-05 NOTE — Patient Instructions (Signed)
No change in medications   Please schedule a follow up visit in 12  months but call sooner if needed  

## 2019-08-16 ENCOUNTER — Other Ambulatory Visit: Payer: Self-pay | Admitting: Family Medicine

## 2019-08-16 DIAGNOSIS — J310 Chronic rhinitis: Secondary | ICD-10-CM

## 2019-08-16 DIAGNOSIS — R067 Sneezing: Secondary | ICD-10-CM

## 2019-08-26 ENCOUNTER — Ambulatory Visit (HOSPITAL_COMMUNITY): Payer: Medicare Other

## 2019-08-30 ENCOUNTER — Encounter (HOSPITAL_COMMUNITY): Payer: Medicare Other

## 2019-08-31 ENCOUNTER — Telehealth (HOSPITAL_COMMUNITY): Payer: Self-pay | Admitting: Pharmacist

## 2019-08-31 NOTE — Telephone Encounter (Signed)
Cardiac Rehab Medication Review by a Pharmacist  Does the patient feel that his/her medications are working for him/her?  yes  Has the patient been experiencing any side effects to the medications prescribed?  no  Does the patient measure his/her own blood pressure or blood glucose at home?  no   Does the patient have any problems obtaining medications due to transportation or finances?   no  Understanding of regimen: excellent Understanding of indications: excellent Potential of compliance: excellent    Pharmacist comments: N/A    Kennon Holter, PharmD PGY1 Ambulatory Care Pharmacy Resident 08/31/2019 3:04 PM

## 2019-09-01 ENCOUNTER — Encounter (HOSPITAL_COMMUNITY)
Admission: RE | Admit: 2019-09-01 | Discharge: 2019-09-01 | Disposition: A | Discharge status: Home / Self Care | Payer: Medicare Other | Source: Ambulatory Visit | Attending: Cardiology | Admitting: Cardiology

## 2019-09-01 ENCOUNTER — Encounter (HOSPITAL_COMMUNITY): Payer: Medicare Other

## 2019-09-01 ENCOUNTER — Other Ambulatory Visit: Payer: Self-pay

## 2019-09-01 DIAGNOSIS — I214 Non-ST elevation (NSTEMI) myocardial infarction: Secondary | ICD-10-CM | POA: Insufficient documentation

## 2019-09-01 NOTE — Progress Notes (Signed)
Cardiac Rehab Note:  Successful telephone encounter to KAIRIE VANGIESON to confirm Cardiac Rehab orientation appointment for 09/02/19 at 1:30. Nursing assessment completed. Patient questions answered. Instructions for appointment provided. Patient screening for Covid-19 negative.  Deondrae Mcgrail E. Rollene Rotunda RN, BSN [place]. [facility]  Cardiac and Pulmonary Rehabilitation Phone: [phone] Fax: [phone]

## 2019-09-02 ENCOUNTER — Telehealth: Payer: Self-pay | Admitting: Family Medicine

## 2019-09-02 ENCOUNTER — Encounter (HOSPITAL_COMMUNITY): Payer: Self-pay

## 2019-09-02 ENCOUNTER — Telehealth: Payer: Self-pay | Admitting: Cardiology

## 2019-09-02 ENCOUNTER — Encounter (HOSPITAL_COMMUNITY)
Admission: RE | Admit: 2019-09-02 | Discharge: 2019-09-02 | Disposition: A | Discharge status: Home / Self Care | Payer: Medicare Other | Source: Ambulatory Visit | Attending: Cardiology | Admitting: Cardiology

## 2019-09-02 VITALS — BP 102/70 | HR 75 | Temp 97.9°F | Ht 65.25 in | Wt 138.0 lb

## 2019-09-02 DIAGNOSIS — I214 Non-ST elevation (NSTEMI) myocardial infarction: Secondary | ICD-10-CM

## 2019-09-02 NOTE — Telephone Encounter (Signed)
Patient would need apt to discuss results with provider. Patient is aware of this and verbalized understanding. Call transferred to front desk for scheduling. AS, CMA

## 2019-09-02 NOTE — Telephone Encounter (Signed)
Per Patient she had a CT Scan @ [facility] 06/10/19 and wishes to discuss its results w/ Dr. Raliegh Scarlet ,says she read there was a something she wanted-- Forwarding  To med asst-- glh

## 2019-09-02 NOTE — Telephone Encounter (Signed)
Message has been sent to patient through my chart also.  I spoke with patient and gave her this information

## 2019-09-02 NOTE — Telephone Encounter (Signed)
Will forward to Dr Meda Coffee for review. Patient also sent my chart message

## 2019-09-02 NOTE — Telephone Encounter (Signed)
New Message    Pt is calling to let Dr Meda Coffee  Know she had a CT done at [facility] and it showed a mild heart enlargement.  She is wanting the nurse or Dr Meda Coffee to call her back     Please advise

## 2019-09-02 NOTE — Progress Notes (Signed)
Cardiac Individual Treatment Plan  Patient Details  Name: Joy Williams MRN: [id number] Date of Birth: January 09, 1951 Referring Provider:     CARDIAC REHAB PHASE II ORIENTATION from 09/02/2019 in [place]  Referring Provider  Ottie Glazier MD      Initial Encounter Date:    CARDIAC REHAB PHASE II ORIENTATION from 09/02/2019 in [place]  Date  09/02/19      Visit Diagnosis: Non-ST elevated myocardial infarction [facility]) 02/2019  Patient's Home Medications on Admission:  Current Outpatient Medications:  .  acetaminophen (TYLENOL) 500 MG tablet, Take 1,000 mg by mouth every 6 (six) hours as needed for mild pain or headache., Disp: , Rfl:  .  aspirin EC 81 MG tablet, Take 1 tablet (81 mg total) by mouth daily., Disp: 90 tablet, Rfl: 3 .  atorvastatin (LIPITOR) 20 MG tablet, Take 1 tablet (20 mg total) by mouth daily at 6 PM., Disp: 90 tablet, Rfl: 1 .  azelastine (ASTELIN) 0.1 % nasal spray, Place 2 sprays into both nostrils 2 (two) times daily. Use in each nostril as directed, Disp: 30 mL, Rfl: 12 .  Cholecalciferol (VITAMIN D-3) 125 MCG (5000 UT) TABS, Take 5,000 Units by mouth daily., Disp: , Rfl:  .  clopidogrel (PLAVIX) 75 MG tablet, Take 1 tablet (75 mg total) by mouth daily., Disp: 90 tablet, Rfl: 1 .  fluticasone (FLONASE) 50 MCG/ACT nasal spray, 1 SPRAY EACH NOSTRIL FOLLOWING SINUS RINSES TWICE DAILY, Disp: 48 mL, Rfl: 0 .  Fluticasone-Umeclidin-Vilant (TRELEGY ELLIPTA) 100-62.5-25 MCG/INH AEPB, Inhale 1 puff into the lungs daily., Disp: 60 each, Rfl: 1 .  levothyroxine (SYNTHROID) 175 MCG tablet, TAKE 1 TABLET DAILY BEFORE BREAKFAST., Disp: 90 tablet, Rfl: 1 .  metoprolol tartrate (LOPRESSOR) 25 MG tablet, Take 1 tablet (25 mg total) by mouth 2 (two) times daily., Disp: 180 tablet, Rfl: 1 .  Multiple Vitamins-Minerals (MULTIVITAMIN WITH MINERALS) tablet, Take 1 tablet by mouth daily., Disp: , Rfl:  .  nitroGLYCERIN  (NITROSTAT) 0.4 MG SL tablet, Place 1 tablet (0.4 mg total) under the tongue every 5 (five) minutes as needed for chest pain., Disp: 25 tablet, Rfl: 3 .  Turmeric 500 MG CAPS, Take 500 mg by mouth daily., Disp: , Rfl:  .  Vitamin D, Ergocalciferol, (DRISDOL) 1.25 MG (50000 UT) CAPS capsule, Take 1 capsule (50,000 Units total) by mouth every 7 (seven) days., Disp: 12 capsule, Rfl: 3  Past Medical History: Past Medical History:  Diagnosis Date  . Allergy   . Asthma    pt denies this, but is on Dulera  . Chronic diastolic congestive heart failure ([place])   . 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 COPD (chronic obstructive pulmonary disease) ([place])   . Family history of adverse reaction to anesthesia    Mother- very sensitive to medication  . Heart murmur    MVP  . Hypothyroidism   . 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 MVP (mitral valve prolapse)   . PONV (postoperative nausea and vomiting)   . Raynaud's disease    Raynaud's  . Raynaud's syndrome   . 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 Severe mitral regurgitation 11/15/2014  . Shortness of breath dyspnea    with exertion  . STD (sexually transmitted disease)   . Telangiectasia   . Thyroid disease     Tobacco Use: Social History   Tobacco Use  Smoking Status Current Every Day Smoker  . Packs/day: 0.25  . Years: 24.00  . Pack years: 6.00  . Types: E-cigarettes, Cigarettes  . Last attempt to quit: 02/18/2019  . Years since quitting: 0.5  Smokeless Tobacco Never Used  Tobacco Comment   Patient is interested in trying nicotene gum     Labs: Recent Review Flowsheet Data    Labs for ITP Cardiac and Pulmonary Rehab Latest Ref Rng & Units 01/14/2017 03/11/2018 03/12/2018 11/11/2018 02/25/2019   Cholestrol 0 - 200 mg/dL 175 - 170 150 133   LDLCALC 0 - 99 mg/dL 92 - 108(H) 64 57   LDLDIRECT 0 - 99 mg/dL - - - 68 -   HDL >40 mg/dL 59 - 48 70 61   Trlycerides <150 mg/dL 122 - 68 78 74   Hemoglobin A1c 4.8 - 5.6 % - - 5.3 - -   PHART 7.350 - 7.450 - - - - -   PCO2ART 32.0 - 48.0 mmHg - - - - -   HCO3 20.0 - 28.0 mmol/L - - - - -   TCO2 22 - 32 mmol/L - 29 - - -   ACIDBASEDEF 0.0 - 2.0 mmol/L - - - - -   O2SAT % - - - - -      Capillary Blood Glucose: Lab Results  Component Value Date   GLUCAP 81 03/11/2018   GLUCAP 86 07/03/2016   GLUCAP 108 (H) 07/02/2016   GLUCAP 102 (H) 07/02/2016   GLUCAP 74 07/02/2016     Exercise Target Goals: Exercise Program Goal: Individual exercise prescription set using results from initial 6 min walk test and THRR while considering  patient's activity barriers and safety.   Exercise Prescription Goal: Starting with aerobic activity 30 plus minutes a day, 3 days per week for initial exercise prescription. Provide home exercise prescription and guidelines that participant acknowledges understanding prior to discharge.  Activity Barriers & Risk Stratification: Activity Barriers & Cardiac Risk Stratification - 09/02/19 1429      Activity Barriers & Cardiac Risk Stratification   Activity Barriers  None    Cardiac Risk Stratification  Moderate       6 Minute Walk: 6 Minute Walk    Row Name 09/02/19 1428         6 Minute Walk   Phase  Initial     Distance  1435 feet     Walk Time  6 minutes     # of Rest Breaks  0     MPH  2.7     METS  3.4     RPE  12     Perceived Dyspnea   0     VO2 Peak  11.9     Symptoms  Yes (comment)     Comments  Mild SOB +1     Resting HR  75 bpm     Resting BP  102/70     Resting Oxygen Saturation   99 %     Exercise Oxygen Saturation   during 6 min walk  97 %     Max Ex. HR  89 bpm     Max Ex.  BP  135/75     2 Minute Post BP  110/64        Oxygen Initial Assessment:   Oxygen Re-Evaluation:   Oxygen Discharge (Final Oxygen Re-Evaluation):   Initial Exercise Prescription: Initial Exercise Prescription - 09/02/19 1400      Date of Initial Exercise RX and Referring Provider   Date  09/02/19    Referring Provider  Ottie Glazier MD    Expected Discharge Date  10/29/19      Treadmill   MPH  2.5    Grade  1    Minutes  15    METs  3.26      NuStep   Level  2    SPM  85    Minutes  15    METs  2.5      Prescription Details   Frequency (times per week)  3x    Duration  Progress to 30 minutes of continuous aerobic without signs/symptoms of physical distress      Intensity   THRR 40-80% of Max Heartrate  61-122    Ratings of Perceived Exertion  11-13    Perceived Dyspnea  0-4      Progression   Progression  Continue progressive overload as per policy without signs/symptoms or physical distress.      Resistance Training   Training Prescription  Yes    Weight  3lbs    Reps  10-15       Perform Capillary Blood Glucose checks as needed.  Exercise Prescription Changes:   Exercise Comments:   Exercise Goals and Review: Exercise Goals    Row Name 09/02/19 1429             Exercise Goals   Increase Physical Activity  Yes       Intervention  Provide advice, education, support and counseling about physical activity/exercise needs.;Develop an individualized exercise prescription for aerobic and resistive training based on initial evaluation findings, risk stratification, comorbidities and participant's personal goals.       Expected Outcomes  Short Term: Attend rehab on a regular basis to increase amount of physical activity.;Long Term: Add in home exercise to make exercise part of routine and to increase amount of physical activity.;Long Term: Exercising regularly at least 3-5 days a week.        Increase Strength and Stamina  Yes       Intervention  Provide advice, education, support and counseling about physical activity/exercise needs.;Develop an individualized exercise prescription for aerobic and resistive training based on initial evaluation findings, risk stratification, comorbidities and participant's personal goals.       Expected Outcomes  Short Term: Perform resistance training exercises routinely during rehab and add in resistance training at home;Short Term: Increase workloads from initial exercise prescription for resistance, speed, and METs.;Long Term: Improve cardiorespiratory fitness, muscular endurance and strength as measured by increased METs and functional capacity (6MWT)       Able to understand and use rate of perceived exertion (RPE) scale  Yes       Intervention  Provide education and explanation on how to use RPE scale       Expected Outcomes  Short Term: Able to use RPE daily in rehab to express subjective intensity level;Long Term:  Able to use RPE to guide intensity level when exercising independently       Knowledge and understanding of Target Heart Rate Range (THRR)  Yes       Intervention  Provide education and  explanation of THRR including how the numbers were predicted and where they are located for reference       Expected Outcomes  Short Term: Able to state/look up THRR;Long Term: Able to use THRR to govern intensity when exercising independently;Short Term: Able to use daily as guideline for intensity in rehab       Able to check pulse independently  Yes       Intervention  Provide education and demonstration on how to check pulse in carotid and radial arteries.;Review the importance of being able to check your own pulse for safety during independent exercise       Expected Outcomes  Short Term: Able to explain why pulse checking is important during independent exercise;Long Term: Able to check pulse independently and accurately       Understanding of  Exercise Prescription  Yes       Intervention  Provide education, explanation, and written materials on patient's individual exercise prescription       Expected Outcomes  Short Term: Able to explain program exercise prescription;Long Term: Able to explain home exercise prescription to exercise independently          Exercise Goals Re-Evaluation :    Discharge Exercise Prescription (Final Exercise Prescription Changes):   Nutrition:  Target Goals: Understanding of nutrition guidelines, daily intake of sodium 1500mg , cholesterol 200mg , calories 30% from fat and 7% or less from saturated fats, daily to have 5 or more servings of fruits and vegetables.  Biometrics: Pre Biometrics - 09/02/19 1429      Pre Biometrics   Height  5' 5.25" (1.657 m)    Weight  62.6 kg    Waist Circumference  32 inches    Hip Circumference  37 inches    Waist to Hip Ratio  0.86 %    BMI (Calculated)  22.8    Triceps Skinfold  37 mm    % Body Fat  36.8 %    Grip Strength  31 kg    Flexibility  16 in    Single Leg Stand  12.81 seconds        Nutrition Therapy Plan and Nutrition Goals:   Nutrition Assessments:   Nutrition Goals Re-Evaluation:   Nutrition Goals Discharge (Final Nutrition Goals Re-Evaluation):   Psychosocial: Target Goals: Acknowledge presence or absence of significant depression and/or stress, maximize coping skills, provide positive support system. Participant is able to verbalize types and ability to use techniques and skills needed for reducing stress and depression.  Initial Review & Psychosocial Screening: Initial Psych Review & Screening - 09/02/19 1605      Initial Review   Current issues with  None Identified      Family Dynamics   Good Support System?  Yes   [name] lives alone and has freinds and children in the area for support     Barriers   Psychosocial barriers to participate in program  There are no identifiable barriers or psychosocial needs.       Screening Interventions   Interventions  Encouraged to exercise       Quality of Life Scores: Quality of Life - 09/02/19 1435      Quality of Life   Select  Quality of Life      Quality of Life Scores   Health/Function Pre  20.7 %    Socioeconomic Pre  22.69 %    Psych/Spiritual Pre  24.86 %    Family Pre  21.6 %    GLOBAL Pre  22.11 %      Scores of 19 and below usually indicate a poorer quality of life in these areas.  A difference of  2-3 points is a clinically meaningful difference.  A difference of 2-3 points in the total score of the Quality of Life Index has been associated with significant improvement in overall quality of life, self-image, physical symptoms, and general health in studies assessing change in quality of life.  PHQ-9: Recent Review Flowsheet Data    Depression screen [facility] 2/9 09/02/2019 07/14/2019 11/16/2018 11/05/2018 04/27/2018   Decreased Interest 0 0 0 0 0   Down, Depressed, Hopeless 0 0 0 0 0   PHQ - 2 Score 0 0 0 0 0   Altered sleeping - 0 0 0 0   Tired, decreased energy - 0 0 0 0   Change in appetite - 0 0 0 0   Feeling bad or failure about yourself  - 0 0 0 0   Trouble concentrating - 0 0 0 0   Moving slowly or fidgety/restless - 0 0 0 0   Suicidal thoughts - 0 0 0 0   PHQ-9 Score - 0 0 0 0   Difficult doing work/chores - Not difficult at all Not difficult at all Not difficult at all Not difficult at all     Interpretation of Total Score  Total Score Depression Severity:  1-4 = Minimal depression, 5-9 = Mild depression, 10-14 = Moderate depression, 15-19 = Moderately severe depression, 20-27 = Severe depression   Psychosocial Evaluation and Intervention:   Psychosocial Re-Evaluation:   Psychosocial Discharge (Final Psychosocial Re-Evaluation):   Vocational Rehabilitation: Provide vocational rehab assistance to qualifying candidates.   Vocational Rehab Evaluation & Intervention: Vocational Rehab - 09/02/19 1607      Initial Vocational  Rehab Evaluation & Intervention   Assessment shows need for Vocational Rehabilitation  No       Education: Education Goals: Education classes will be provided on a weekly basis, covering required topics. Participant will state understanding/return demonstration of topics presented.  Learning Barriers/Preferences: Learning Barriers/Preferences - 09/02/19 1438      Learning Barriers/Preferences   Learning Barriers  None    Learning Preferences  Verbal Instruction;Video;Written Material;Skilled Demonstration       Education Topics: Hypertension, Hypertension Reduction -Define heart disease and high blood pressure. Discus how high blood pressure affects the body and ways to reduce high blood pressure.   Exercise and Your Heart -Discuss why it is important to exercise, the FITT principles of exercise, normal and abnormal responses to exercise, and how to exercise safely.   Angina -Discuss definition of angina, causes of angina, treatment of angina, and how to decrease risk of having angina.   Cardiac Medications -Review what the following cardiac medications are used for, how they affect the body, and side effects that may occur when taking the medications.  Medications include Aspirin, Beta blockers, calcium channel blockers, ACE Inhibitors, angiotensin receptor blockers, diuretics, digoxin, and antihyperlipidemics.   Congestive Heart Failure -Discuss the definition of CHF, how to live with CHF, the signs and symptoms of CHF, and how keep track of weight and sodium intake.   Heart Disease and Intimacy -Discus the effect sexual activity has on the heart, how changes occur during intimacy as we age, and safety during sexual activity.   Smoking Cessation / COPD -Discuss different methods to quit smoking, the health benefits of quitting smoking, and the definition of COPD.   Nutrition I: Fats -Discuss the  types of cholesterol, what cholesterol does to the heart, and how  cholesterol levels can be controlled.   Nutrition II: Labels -Discuss the different components of food labels and how to read food label   Heart Parts/Heart Disease and PAD -Discuss the anatomy of the heart, the pathway of blood circulation through the heart, and these are affected by heart disease.   Stress I: Signs and Symptoms -Discuss the causes of stress, how stress may lead to anxiety and depression, and ways to limit stress.   Stress II: Relaxation -Discuss different types of relaxation techniques to limit stress.   Warning Signs of Stroke / TIA -Discuss definition of a stroke, what the signs and symptoms are of a stroke, and how to identify when someone is having stroke.   Knowledge Questionnaire Score: Knowledge Questionnaire Score - 09/02/19 1435      Knowledge Questionnaire Score   Pre Score  22/28       Core Components/Risk Factors/Patient Goals at Admission: Personal Goals and Risk Factors at Admission - 09/02/19 1608      Core Components/Risk Factors/Patient Goals on Admission    Weight Management  Weight Maintenance;Yes    Intervention  Weight Management: Provide education and appropriate resources to help participant work on and attain dietary goals.;Weight Management: Develop a combined nutrition and exercise program designed to reach desired caloric intake, while maintaining appropriate intake of nutrient and fiber, sodium and fats, and appropriate energy expenditure required for the weight goal.    Tobacco Cessation  Yes   Patient given smoking cessation information   Number of packs per day  5-6 cigarettes per day    Intervention  Assist the participant in steps to quit. Provide individualized education and counseling about committing to Tobacco Cessation, relapse prevention, and pharmacological support that can be provided by physician.;Advice worker, assist with locating and accessing local/national Quit Smoking programs, and support quit  date choice.    Expected Outcomes  Short Term: Will demonstrate readiness to quit, by selecting a quit date.;Short Term: Will quit all tobacco product use, adhering to prevention of relapse plan.;Long Term: Complete abstinence from all tobacco products for at least 12 months from quit date.    Heart Failure  Yes    Intervention  Provide a combined exercise and nutrition program that is supplemented with education, support and counseling about heart failure. Directed toward relieving symptoms such as shortness of breath, decreased exercise tolerance, and extremity edema.    Expected Outcomes  Improve functional capacity of life;Short term: Attendance in program 2-3 days a week with increased exercise capacity. Reported lower sodium intake. Reported increased fruit and vegetable intake. Reports medication compliance.;Short term: Daily weights obtained and reported for increase. Utilizing diuretic protocols set by physician.;Long term: Adoption of self-care skills and reduction of barriers for early signs and symptoms recognition and intervention leading to self-care maintenance.    Hypertension  Yes    Intervention  Provide education on lifestyle modifcations including regular physical activity/exercise, weight management, moderate sodium restriction and increased consumption of fresh fruit, vegetables, and low fat dairy, alcohol moderation, and smoking cessation.;Monitor prescription use compliance.    Expected Outcomes  Short Term: Continued assessment and intervention until BP is < 140/71mm HG in hypertensive participants. < 130/7mm HG in hypertensive participants with diabetes, heart failure or chronic kidney disease.;Long Term: Maintenance of blood pressure at goal levels.    Lipids  Yes    Intervention  Provide education and support for participant on nutrition & aerobic/resistive  exercise along with prescribed medications to achieve LDL 70mg , HDL >40mg .    Expected Outcomes  Short Term: Participant  states understanding of desired cholesterol values and is compliant with medications prescribed. Participant is following exercise prescription and nutrition guidelines.;Long Term: Cholesterol controlled with medications as prescribed, with individualized exercise RX and with personalized nutrition plan. Value goals: LDL < 70mg , HDL > 40 mg.       Core Components/Risk Factors/Patient Goals Review:    Core Components/Risk Factors/Patient Goals at Discharge (Final Review):    ITP Comments: ITP Comments    Row Name 09/02/19 1419           ITP Comments  Medical Director:  Dr. Fransico Him           Comments: [name] attended orientation on 09/02/2019 to review rules and guidelines for program.  Completed 6 minute walk test, Intitial ITP, and exercise prescription.  VSS. Telemetry-Sinus Rhythm.  [name] did report having some mild shortness of breath otherwise Asymptomatic. Safety measures and social distancing in place per CDC guidelines.Barnet Pall, RN,BSN 09/02/2019 4:13 PM

## 2019-09-03 ENCOUNTER — Encounter: Payer: Self-pay | Admitting: Family Medicine

## 2019-09-03 ENCOUNTER — Encounter (HOSPITAL_COMMUNITY): Payer: Medicare Other

## 2019-09-06 ENCOUNTER — Encounter (HOSPITAL_COMMUNITY): Payer: Medicare Other

## 2019-09-08 ENCOUNTER — Encounter (HOSPITAL_COMMUNITY)
Admission: RE | Admit: 2019-09-08 | Discharge: 2019-09-08 | Disposition: A | Discharge status: Home / Self Care | Payer: Medicare Other | Source: Ambulatory Visit | Attending: Cardiology | Admitting: Cardiology

## 2019-09-08 ENCOUNTER — Encounter (HOSPITAL_COMMUNITY): Payer: Medicare Other

## 2019-09-08 ENCOUNTER — Other Ambulatory Visit: Payer: Self-pay

## 2019-09-08 DIAGNOSIS — I214 Non-ST elevation (NSTEMI) myocardial infarction: Secondary | ICD-10-CM | POA: Diagnosis not present

## 2019-09-08 NOTE — Progress Notes (Signed)
Daily Session Note  Patient Details  Name: Joy Williams MRN: [id number] Date of Birth: Mar 09, 1951 Referring Provider:     CARDIAC REHAB PHASE II ORIENTATION from 09/02/2019 in [place]  Referring Provider  Ottie Glazier MD      Encounter Date: 09/08/2019  Check In: Session Check In - 09/08/19 1419      Check-In   Supervising physician immediately available to respond to emergencies  [facility] immediately available    Physician(s)  Dr. Maylene Roes    Location  [facility]    Staff Present  Dorma Russell, MS,ACSM CEP, Exercise Physiologist;Maria Whitaker, RN, Luisa Hart, RN, Deland Pretty, MS, ACSM CEP, Exercise Physiologist    Virtual Visit  No    Medication changes reported      No    Fall or balance concerns reported     No    Tobacco Cessation  No Change    Warm-up and Cool-down  Performed on first and last piece of equipment    Resistance Training Performed  No    VAD Patient?  No    PAD/SET Patient?  No      Pain Assessment   Currently in Pain?  No/denies    Pain Score  0-No pain    Multiple Pain Sites  No       Capillary Blood Glucose: No results found for this or any previous visit (from the past 24 hour(s)).    Social History   Tobacco Use  Smoking Status Current Every Day Smoker  . Packs/day: 0.25  . Years: 24.00  . Pack years: 6.00  . Types: E-cigarettes, Cigarettes  . Last attempt to quit: 02/18/2019  . Years since quitting: 0.5  Smokeless Tobacco Never Used  Tobacco Comment   Patient is interested in trying nicotene gum    Goals Met:  Exercise tolerated well No report of cardiac concerns or symptoms  Goals Unmet:  Not Applicable  Comments: Pt started cardiac rehab today.  Pt tolerated light exercise without difficulty. VSS, telemetry-NSR, asymptomatic.  Medication list reconciled. Pt denies barriers to medicaiton compliance.  PSYCHOSOCIAL ASSESSMENT:  PHQ-0. Pt exhibits positive  coping skills, hopeful outlook with supportive family. No psychosocial needs identified at this time, no psychosocial interventions necessary.  Pt oriented to exercise equipment and routine. Understanding verbalized.   Dr. Fransico Him is Medical Director for Cardiac Rehab at [facility].

## 2019-09-10 ENCOUNTER — Encounter (HOSPITAL_COMMUNITY)
Admission: RE | Admit: 2019-09-10 | Discharge: 2019-09-10 | Disposition: A | Discharge status: Home / Self Care | Payer: Medicare Other | Source: Ambulatory Visit | Attending: Cardiology | Admitting: Cardiology

## 2019-09-10 ENCOUNTER — Other Ambulatory Visit: Payer: Self-pay

## 2019-09-10 ENCOUNTER — Encounter (HOSPITAL_COMMUNITY): Payer: Medicare Other

## 2019-09-10 DIAGNOSIS — I214 Non-ST elevation (NSTEMI) myocardial infarction: Secondary | ICD-10-CM | POA: Diagnosis not present

## 2019-09-12 ENCOUNTER — Other Ambulatory Visit: Payer: Self-pay | Admitting: Internal Medicine

## 2019-09-13 ENCOUNTER — Encounter (HOSPITAL_COMMUNITY)
Admission: RE | Admit: 2019-09-13 | Discharge: 2019-09-13 | Disposition: A | Discharge status: Home / Self Care | Payer: Medicare Other | Source: Ambulatory Visit | Attending: Cardiology | Admitting: Cardiology

## 2019-09-13 ENCOUNTER — Encounter (HOSPITAL_COMMUNITY): Payer: Medicare Other

## 2019-09-13 ENCOUNTER — Other Ambulatory Visit: Payer: Self-pay

## 2019-09-13 DIAGNOSIS — I214 Non-ST elevation (NSTEMI) myocardial infarction: Secondary | ICD-10-CM | POA: Diagnosis not present

## 2019-09-13 NOTE — Progress Notes (Signed)
[place] 69 y.o. female Nutrition Note  Visit Diagnosis: Non-ST elevated myocardial infarction [facility]) 02/2019   Past Medical History:  Diagnosis Date  . Allergy   . Asthma    pt denies this, but is on Dulera  . Chronic diastolic congestive heart failure ([employer])   . 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 COPD (chronic obstructive pulmonary disease) ([place])   . Family history of adverse reaction to anesthesia    Mother- very sensitive to medication  . Heart murmur    MVP  . Hypothyroidism   . 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 MVP (mitral valve prolapse)   . PONV (postoperative nausea and vomiting)   . Raynaud's disease    Raynaud's  . Raynaud's syndrome   . 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 Severe mitral regurgitation 11/15/2014  . Shortness of breath dyspnea    with exertion  . STD (sexually transmitted disease)   . Telangiectasia   . Thyroid disease      Medications reviewed.   Current Outpatient Medications:  .  acetaminophen (TYLENOL) 500 MG tablet, Take 1,000 mg by mouth every 6 (six) hours as needed for mild pain or headache., Disp: , Rfl:  .  aspirin EC 81 MG tablet, Take 1 tablet (81 mg total) by mouth daily., Disp: 90 tablet, Rfl: 3 .  atorvastatin (LIPITOR) 20 MG tablet, Take 1 tablet (20 mg total) by mouth daily at 6 PM., Disp: 90 tablet, Rfl: 1 .  azelastine (ASTELIN) 0.1 % nasal spray, Place 2 sprays into both nostrils 2 (two) times daily. Use in each nostril as directed, Disp: 30 mL, Rfl: 12 .  Cholecalciferol (VITAMIN D-3) 125 MCG (5000 UT) TABS, Take 5,000  Units by mouth daily., Disp: , Rfl:  .  clopidogrel (PLAVIX) 75 MG tablet, Take 1 tablet (75 mg total) by mouth daily., Disp: 90 tablet, Rfl: 1 .  fluticasone (FLONASE) 50 MCG/ACT nasal spray, 1 SPRAY EACH NOSTRIL FOLLOWING SINUS RINSES TWICE DAILY, Disp: 48 mL, Rfl: 0 .  levothyroxine (SYNTHROID) 175 MCG tablet, TAKE 1 TABLET DAILY BEFORE BREAKFAST., Disp: 90 tablet, Rfl: 1 .  metoprolol tartrate (LOPRESSOR) 25 MG tablet, Take 1 tablet (25 mg total) by mouth 2 (two) times daily., Disp: 180 tablet, Rfl: 1 .  Multiple Vitamins-Minerals (MULTIVITAMIN WITH MINERALS) tablet, Take 1 tablet by mouth daily., Disp: , Rfl:  .  nitroGLYCERIN (NITROSTAT) 0.4 MG SL tablet, Place 1 tablet (0.4 mg total) under the tongue every 5 (five) minutes as needed for chest pain., Disp: 25 tablet, Rfl: 3 .  TRELEGY ELLIPTA 100-62.5-25 MCG/INH AEPB, TAKE 1 PUFF BY MOUTH EVERY DAY, Disp: 60 each, Rfl: 11 .  Turmeric 500 MG CAPS, Take 500 mg by mouth daily., Disp: , Rfl:  .  Vitamin D, Ergocalciferol, (DRISDOL) 1.25 MG (50000 UT) CAPS capsule, Take 1 capsule (50,000 Units total) by mouth every 7 (seven) days., Disp: 12 capsule, Rfl: 3   Ht Readings from Last 1 Encounters:  09/02/19 5' 5.25" (1.657 m)     Wt Readings from Last 3 Encounters:  09/02/19 138 lb 0.1  oz (62.6 kg)  08/05/19 134 lb 12.8 oz (61.1 kg)  06/24/19 134 lb 12.8 oz (61.1 kg)     There is no height or weight on file to calculate BMI.   Social History   Tobacco Use  Smoking Status Current Every Day Smoker  . Packs/day: 0.25  . Years: 24.00  . Pack years: 6.00  . Types: E-cigarettes, Cigarettes  . Last attempt to quit: 02/18/2019  . Years since quitting: 0.5  Smokeless Tobacco Never Used  Tobacco Comment   Patient is interested in trying nicotene gum     Lab Results  Component Value Date   CHOL 133 02/25/2019   Lab Results  Component Value Date   HDL 61 02/25/2019   Lab Results  Component Value Date   LDLCALC 57 02/25/2019   Lab  Results  Component Value Date   TRIG 74 02/25/2019     Lab Results  Component Value Date   HGBA1C 5.3 03/12/2018     CBG (last 3)  No results for input(s): GLUCAP in the last 72 hours.   Nutrition Note  Spoke with pt. Nutrition Plan and Nutrition Survey goals reviewed with pt.  Pt interested in following a generally healthy diet. She does not read labels. Hx of cancer and interested in eating a healthy diet for that. We discussed incorporating fruits and veggies and whole grains daily. She is amenable. She set her own goals.   Pt expressed understanding of the information reviewed.   Nutrition Diagnosis ? Excessive sodium intake related to over consumption of restaurant food as evidenced by pt report of daily eating out Nutrition Intervention ? Pt's individual nutrition plan reviewed with pt. ? Benefits of adopting Heart Healthy diet discussed when Medficts reviewed.   ? Continue client-centered nutrition education by RD, as part of interdisciplinary care.  Goal(s) ? Pt to build a healthy plate including vegetables, fruits, whole grains, and low-fat dairy products in a heart healthy meal plan.   Plan:   Will provide client-centered nutrition education as part of interdisciplinary care  Monitor and evaluate progress toward nutrition goal with team.   Michaele Offer, MS, RDN, LDN

## 2019-09-15 ENCOUNTER — Encounter (HOSPITAL_COMMUNITY)
Admission: RE | Admit: 2019-09-15 | Discharge: 2019-09-15 | Disposition: A | Discharge status: Home / Self Care | Payer: Medicare Other | Source: Ambulatory Visit | Attending: Cardiology | Admitting: Cardiology

## 2019-09-15 ENCOUNTER — Other Ambulatory Visit: Payer: Self-pay

## 2019-09-15 ENCOUNTER — Encounter (HOSPITAL_COMMUNITY): Payer: Medicare Other

## 2019-09-15 VITALS — BP 110/70 | HR 63 | Temp 97.2°F | Wt 140.7 lb

## 2019-09-15 DIAGNOSIS — I214 Non-ST elevation (NSTEMI) myocardial infarction: Secondary | ICD-10-CM

## 2019-09-17 ENCOUNTER — Encounter (HOSPITAL_COMMUNITY): Payer: Medicare Other

## 2019-09-20 ENCOUNTER — Encounter (HOSPITAL_COMMUNITY)
Admission: RE | Admit: 2019-09-20 | Discharge: 2019-09-20 | Disposition: A | Discharge status: Home / Self Care | Payer: Medicare Other | Source: Ambulatory Visit | Attending: Cardiology | Admitting: Cardiology

## 2019-09-20 ENCOUNTER — Other Ambulatory Visit: Payer: Self-pay

## 2019-09-20 ENCOUNTER — Encounter (HOSPITAL_COMMUNITY): Payer: Medicare Other

## 2019-09-20 DIAGNOSIS — I214 Non-ST elevation (NSTEMI) myocardial infarction: Secondary | ICD-10-CM | POA: Insufficient documentation

## 2019-09-20 NOTE — Progress Notes (Signed)
Discharge Progress Report  Patient Details  Name: Joy Williams MRN: [id number] Date of Birth: Oct 03, 1950 Referring Provider:     CARDIAC REHAB PHASE II ORIENTATION from 09/02/2019 in [place]  Referring Provider  Ottie Glazier MD       Number of Visits: 4  Reason for Discharge:  Early Exit:  Personal- Joy Williams states the class time is not convenient for her and she just does not like to exercise. Education provided to patient related to CAD and risk factor modifications. Patient states she will consider returning to the program after May as she is going out of town. She would prefer an earlier class time.  Smoking History:  Social History   Tobacco Use  Smoking Status Current Every Day Smoker  . Packs/day: 0.25  . Years: 24.00  . Pack years: 6.00  . Types: E-cigarettes, Cigarettes  . Last attempt to quit: 02/18/2019  . Years since quitting: 0.5  Smokeless Tobacco Never Used  Tobacco Comment   Patient is interested in trying nicotene gum    Diagnosis:  Non-ST elevated myocardial infarction ([place]) 02/2019  ADL UCSD:   Initial Exercise Prescription: Initial Exercise Prescription - 09/02/19 1400      Date of Initial Exercise RX and Referring Provider   Date  09/02/19    Referring Provider  Ottie Glazier MD    Expected Discharge Date  10/29/19      Treadmill   MPH  2.5    Grade  1    Minutes  15    METs  3.26      NuStep   Level  2    SPM  85    Minutes  15    METs  2.5      Prescription Details   Frequency (times per week)  3x    Duration  Progress to 30 minutes of continuous aerobic without signs/symptoms of physical distress      Intensity   THRR 40-80% of Max Heartrate  61-122    Ratings of Perceived Exertion  11-13    Perceived Dyspnea  0-4      Progression   Progression  Continue progressive overload as per policy without signs/symptoms or physical distress.      Resistance Training   Training  Prescription  Yes    Weight  3lbs    Reps  10-15       Discharge Exercise Prescription (Final Exercise Prescription Changes): Exercise Prescription Changes - 09/08/19 1353      Response to Exercise   Blood Pressure (Admit)  114/80    Blood Pressure (Exercise)  124/64    Blood Pressure (Exit)  110/76    Heart Rate (Admit)  61 bpm    Heart Rate (Exercise)  79 bpm    Heart Rate (Exit)  62 bpm    Rating of Perceived Exertion (Exercise)  12    Symptoms  none    Comments  Off to a good start with exercise.    Duration  Continue with 30 min of aerobic exercise without signs/symptoms of physical distress.    Intensity  THRR unchanged      Progression   Progression  Continue to progress workloads to maintain intensity without signs/symptoms of physical distress.    Average METs  2.4      Resistance Training   Training Prescription  No      Interval Training   Interval Training  No  Treadmill   MPH  2.2    Grade  1    Minutes  15    METs  2.99      NuStep   Level  2    SPM  85    Minutes  15    METs  1.9       Functional Capacity: 6 Minute Walk    Row Name 09/02/19 1428         6 Minute Walk   Phase  Initial     Distance  1435 feet     Walk Time  6 minutes     # of Rest Breaks  0     MPH  2.7     METS  3.4     RPE  12     Perceived Dyspnea   0     VO2 Peak  11.9     Symptoms  Yes (comment)     Comments  Mild SOB +1     Resting HR  75 bpm     Resting BP  102/70     Resting Oxygen Saturation   99 %     Exercise Oxygen Saturation  during 6 min walk  97 %     Max Ex. HR  89 bpm     Max Ex. BP  135/75     2 Minute Post BP  110/64        Psychological, QOL, Others - Outcomes: PHQ 2/9: Depression screen [facility] 2/9 09/02/2019 07/14/2019 11/16/2018 11/05/2018 04/27/2018  Decreased Interest 0 0 0 0 0  Down, Depressed, Hopeless 0 0 0 0 0  PHQ - 2 Score 0 0 0 0 0  Altered sleeping - 0 0 0 0  Tired, decreased energy - 0 0 0 0  Change in appetite - 0 0 0 0   Feeling bad or failure about yourself  - 0 0 0 0  Trouble concentrating - 0 0 0 0  Moving slowly or fidgety/restless - 0 0 0 0  Suicidal thoughts - 0 0 0 0  PHQ-9 Score - 0 0 0 0  Difficult doing work/chores - Not difficult at all Not difficult at all Not difficult at all Not difficult at all  Some recent data might be hidden    Quality of Life: Quality of Life - 09/02/19 1435      Quality of Life   Select  Quality of Life      Quality of Life Scores   Health/Function Pre  20.7 %    Socioeconomic Pre  22.69 %    Psych/Spiritual Pre  24.86 %    Family Pre  21.6 %    GLOBAL Pre  22.11 %       Personal Goals: Goals established at orientation with interventions provided to work toward goal. Personal Goals and Risk Factors at Admission - 09/02/19 1608      Core Components/Risk Factors/Patient Goals on Admission    Weight Management  Weight Maintenance;Yes    Intervention  Weight Management: Provide education and appropriate resources to help participant work on and attain dietary goals.;Weight Management: Develop a combined nutrition and exercise program designed to reach desired caloric intake, while maintaining appropriate intake of nutrient and fiber, sodium and fats, and appropriate energy expenditure required for the weight goal.    Tobacco Cessation  Yes   Patient given smoking cessation information   Number of packs per day  5-6 cigarettes per day  Intervention  Assist the participant in steps to quit. Provide individualized education and counseling about committing to Tobacco Cessation, relapse prevention, and pharmacological support that can be provided by physician.;Advice worker, assist with locating and accessing local/national Quit Smoking programs, and support quit date choice.    Expected Outcomes  Short Term: Will demonstrate readiness to quit, by selecting a quit date.;Short Term: Will quit all tobacco product use, adhering to prevention of relapse  plan.;Long Term: Complete abstinence from all tobacco products for at least 12 months from quit date.    Heart Failure  Yes    Intervention  Provide a combined exercise and nutrition program that is supplemented with education, support and counseling about heart failure. Directed toward relieving symptoms such as shortness of breath, decreased exercise tolerance, and extremity edema.    Expected Outcomes  Improve functional capacity of life;Short term: Attendance in program 2-3 days a week with increased exercise capacity. Reported lower sodium intake. Reported increased fruit and vegetable intake. Reports medication compliance.;Short term: Daily weights obtained and reported for increase. Utilizing diuretic protocols set by physician.;Long term: Adoption of self-care skills and reduction of barriers for early signs and symptoms recognition and intervention leading to self-care maintenance.    Hypertension  Yes    Intervention  Provide education on lifestyle modifcations including regular physical activity/exercise, weight management, moderate sodium restriction and increased consumption of fresh fruit, vegetables, and low fat dairy, alcohol moderation, and smoking cessation.;Monitor prescription use compliance.    Expected Outcomes  Short Term: Continued assessment and intervention until BP is < 140/78mm HG in hypertensive participants. < 130/54mm HG in hypertensive participants with diabetes, heart failure or chronic kidney disease.;Long Term: Maintenance of blood pressure at goal levels.    Lipids  Yes    Intervention  Provide education and support for participant on nutrition & aerobic/resistive exercise along with prescribed medications to achieve LDL 70mg , HDL >40mg .    Expected Outcomes  Short Term: Participant states understanding of desired cholesterol values and is compliant with medications prescribed. Participant is following exercise prescription and nutrition guidelines.;Long Term:  Cholesterol controlled with medications as prescribed, with individualized exercise RX and with personalized nutrition plan. Value goals: LDL < 70mg , HDL > 40 mg.        Personal Goals Discharge: Goals and Risk Factor Review    Row Name 09/08/19 1633             Core Components/Risk Factors/Patient Goals Review   Personal Goals Review  Weight Management/Obesity;Lipids;Tobacco Cessation;Heart Failure;Hypertension       Review  Ms. Putman has multiple CAD risk factors including dx of current smoker. She is eager to participate in CR for education and lifestyle modification. She is ready to stop smoking and has been provided smoking cessasion tools. Her goals are to increase her physical activity, establish an exercise plan, and is hopeful that she can get back to a walk/run 5 K       Expected Outcomes  Patient will continue to participate in CR for risk factor modification and exercise.          Exercise Goals and Review: Exercise Goals    Row Name 09/02/19 1429             Exercise Goals   Increase Physical Activity  Yes       Intervention  Provide advice, education, support and counseling about physical activity/exercise needs.;Develop an individualized exercise prescription for aerobic and resistive training based on initial evaluation findings, risk  stratification, comorbidities and participant's personal goals.       Expected Outcomes  Short Term: Attend rehab on a regular basis to increase amount of physical activity.;Long Term: Add in home exercise to make exercise part of routine and to increase amount of physical activity.;Long Term: Exercising regularly at least 3-5 days a week.       Increase Strength and Stamina  Yes       Intervention  Provide advice, education, support and counseling about physical activity/exercise needs.;Develop an individualized exercise prescription for aerobic and resistive training based on initial evaluation findings, risk stratification,  comorbidities and participant's personal goals.       Expected Outcomes  Short Term: Perform resistance training exercises routinely during rehab and add in resistance training at home;Short Term: Increase workloads from initial exercise prescription for resistance, speed, and METs.;Long Term: Improve cardiorespiratory fitness, muscular endurance and strength as measured by increased METs and functional capacity (6MWT)       Able to understand and use rate of perceived exertion (RPE) scale  Yes       Intervention  Provide education and explanation on how to use RPE scale       Expected Outcomes  Short Term: Able to use RPE daily in rehab to express subjective intensity level;Long Term:  Able to use RPE to guide intensity level when exercising independently       Knowledge and understanding of Target Heart Rate Range (THRR)  Yes       Intervention  Provide education and explanation of THRR including how the numbers were predicted and where they are located for reference       Expected Outcomes  Short Term: Able to state/look up THRR;Long Term: Able to use THRR to govern intensity when exercising independently;Short Term: Able to use daily as guideline for intensity in rehab       Able to check pulse independently  Yes       Intervention  Provide education and demonstration on how to check pulse in carotid and radial arteries.;Review the importance of being able to check your own pulse for safety during independent exercise       Expected Outcomes  Short Term: Able to explain why pulse checking is important during independent exercise;Long Term: Able to check pulse independently and accurately       Understanding of Exercise Prescription  Yes       Intervention  Provide education, explanation, and written materials on patient's individual exercise prescription       Expected Outcomes  Short Term: Able to explain program exercise prescription;Long Term: Able to explain home exercise prescription to  exercise independently          Exercise Goals Re-Evaluation: Exercise Goals Re-Evaluation    [place] Name 09/08/19 1447             Exercise Goal Re-Evaluation   Exercise Goals Review  Increase Physical Activity;Able to understand and use rate of perceived exertion (RPE) scale       Comments  Patient able to understand and use RPE scale appropriately.       Expected Outcomes  Increase workloads as tolerated to help improve cardiorespiratory fitness.          Nutrition & Weight - Outcomes: Pre Biometrics - 09/02/19 1429      Pre Biometrics   Height  5' 5.25" (1.657 m)    Weight  62.6 kg    Waist Circumference  32 inches    Hip  Circumference  37 inches    Waist to Hip Ratio  0.86 %    BMI (Calculated)  22.8    Triceps Skinfold  37 mm    % Body Fat  36.8 %    Grip Strength  31 kg    Flexibility  16 in    Single Leg Stand  12.81 seconds        Nutrition:   Nutrition Discharge: Nutrition Assessments - 09/16/19 0809      MEDFICTS Scores   Pre Score  27       Education Questionnaire Score: Knowledge Questionnaire Score - 09/02/19 1435      Knowledge Questionnaire Score   Pre Score  22/28

## 2019-09-22 ENCOUNTER — Encounter (HOSPITAL_COMMUNITY): Payer: Medicare Other

## 2019-09-23 DIAGNOSIS — C3482 Malignant neoplasm of overlapping sites of left bronchus and lung: Secondary | ICD-10-CM | POA: Diagnosis not present

## 2019-09-23 DIAGNOSIS — Z87891 Personal history of nicotine dependence: Secondary | ICD-10-CM | POA: Diagnosis not present

## 2019-09-23 DIAGNOSIS — R918 Other nonspecific abnormal finding of lung field: Secondary | ICD-10-CM | POA: Diagnosis not present

## 2019-09-23 DIAGNOSIS — C3492 Malignant neoplasm of unspecified part of left bronchus or lung: Secondary | ICD-10-CM | POA: Diagnosis not present

## 2019-09-24 ENCOUNTER — Encounter (HOSPITAL_COMMUNITY): Payer: Medicare Other

## 2019-09-27 ENCOUNTER — Encounter (HOSPITAL_COMMUNITY): Payer: Medicare Other

## 2019-09-29 ENCOUNTER — Encounter (HOSPITAL_COMMUNITY): Payer: Medicare Other

## 2019-09-29 NOTE — Progress Notes (Deleted)
Established Patient Office Visit  Subjective:  Patient ID: Joy Williams, female    DOB: 22-Apr-1951  Age: 69 y.o. MRN: [id number]  CC: No chief complaint on file.   HPI Joy Williams presents for discussion of CT scan.   Past Medical History:  Diagnosis Date  . Allergy   . Asthma    pt denies this, but is on Dulera  . Chronic diastolic congestive heart failure ([place])   . 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 COPD (chronic obstructive pulmonary disease) ([place])   . Family history of adverse reaction to anesthesia    Mother- very sensitive to medication  . Heart murmur    MVP  . Hypothyroidism   . 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 MVP (mitral valve prolapse)   . PONV (postoperative nausea and vomiting)   . Raynaud's disease    Raynaud's  . Raynaud's syndrome   . 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 Severe mitral regurgitation 11/15/2014  . Shortness of breath dyspnea    with exertion  . STD (sexually transmitted disease)   . Telangiectasia   . Thyroid disease     Past Surgical History:  Procedure Laterality Date  . BACK SURGERY     x 3  Disectomy  . BREAST BIOPSY Left   . CARDIAC CATHETERIZATION N/A 03/08/2016   Procedure: Right/Left Heart Cath and Coronary Angiography;  Surgeon: Sherren Mocha, MD;  Location: [place] [facility];  Service: Cardiovascular;  Laterality: N/A;  . CLAVICLE SURGERY Left 2013   plate to left collar bone  . LAPAROSCOPY     ? reason-age 43   . LEFT HEART CATH AND CORONARY ANGIOGRAPHY N/A 02/25/2019   Procedure: LEFT HEART CATH AND CORONARY  ANGIOGRAPHY;  Surgeon: Jettie Booze, MD;  Location: [place] [facility];  Service: Cardiovascular;  Laterality: N/A;  . LUNG CANCER SURGERY    . MITRAL VALVE REPAIR Right 06/27/2016   Procedure: MINIMALLY INVASIVE MITRAL VALVE REPLACEMENT;  Surgeon: Rexene Alberts, MD;  Location: [place];  Service: Open Heart Surgery;  Laterality: Right;  . TEE WITHOUT CARDIOVERSION N/A 02/22/2016   Procedure: TRANSESOPHAGEAL ECHOCARDIOGRAM (TEE);  Surgeon: Dorothy Spark, MD;  Location: [place];  Service: Cardiovascular;  Laterality: N/A;  . TEE WITHOUT CARDIOVERSION N/A 06/27/2016   Procedure: TRANSESOPHAGEAL ECHOCARDIOGRAM (TEE);  Surgeon: Rexene Alberts, MD;  Location: [place];  Service: Open Heart Surgery;  Laterality: N/A;  . TONSILLECTOMY    . VIDEO ASSISTED THORACOSCOPY (VATS)/WEDGE RESECTION Left 12/30/2014   Bronchoscopy, Mediastinoscopy, Left VATS for Wedge resection LUL x2 adn LLL x1 - Dr. Elenor Quinones at [facility] [place]  . VOCAL CORD INJECTION Left 2017   injected with botox  . wrist surgery Left 2015   plate to wrist     Family History  Problem Relation Age of Onset  . Mitral valve prolapse Mother   . Dementia Father   . Prostate cancer Father   . Mitral valve prolapse Brother   . Mitral valve prolapse Sister   . Colon cancer Neg Hx   . Colon polyps Neg Hx   .  Rectal cancer Neg Hx   . Esophageal cancer Neg Hx   . Stomach cancer Neg Hx   . Heart attack Neg Hx     Social History   Socioeconomic History  . Marital status: Divorced    Spouse name: Not on file  . Number of children: Not on file  . Years of education: 35  . Highest education level: Not on file  Occupational History  . Occupation: Retired  Tobacco Use  . Smoking status: Current Every Day Smoker    Packs/day: 0.25    Years: 24.00    Pack years: 6.00    Types: E-cigarettes, Cigarettes    Last attempt to quit: 02/18/2019    Years since quitting: 0.6  . Smokeless tobacco: Never Used  . Tobacco comment: Patient is  interested in trying nicotene gum  Substance and Sexual Activity  . Alcohol use: Yes    Alcohol/week: 2.0 standard drinks    Types: 1 Shots of liquor, 1 Standard drinks or equivalent per week  . Drug use: No  . Sexual activity: Never    Partners: Male    Birth control/protection: Post-menopausal  Other Topics Concern  . Not on file  Social History Narrative  . Not on file   Social Determinants of Health   Financial Resource Strain:   . Difficulty of Paying Living Expenses:   Food Insecurity:   . Worried About Charity fundraiser in the Last Year:   . Arboriculturist in the Last Year:   Transportation Needs:   . Film/video editor (Medical):   Marland Kitchen Lack of Transportation (Non-Medical):   Physical Activity:   . Days of Exercise per Week:   . Minutes of Exercise per Session:   Stress:   . Feeling of Stress :   Social Connections:   . Frequency of Communication with Friends and Family:   . Frequency of Social Gatherings with Friends and Family:   . Attends Religious Services:   . Active Member of Clubs or Organizations:   . Attends Archivist Meetings:   Marland Kitchen Marital Status:   Intimate Partner Violence:   . Fear of Current or Ex-Partner:   . Emotionally Abused:   Marland Kitchen Physically Abused:   . Sexually Abused:     Outpatient Medications Prior to Visit  Medication Sig Dispense Refill  . acetaminophen (TYLENOL) 500 MG tablet Take 1,000 mg by mouth every 6 (six) hours as needed for mild pain or headache.    Marland Kitchen aspirin EC 81 MG tablet Take 1 tablet (81 mg total) by mouth daily. 90 tablet 3  . atorvastatin (LIPITOR) 20 MG tablet Take 1 tablet (20 mg total) by mouth daily at 6 PM. 90 tablet 1  . azelastine (ASTELIN) 0.1 % nasal spray Place 2 sprays into both nostrils 2 (two) times daily. Use in each nostril as directed 30 mL 12  . Cholecalciferol (VITAMIN D-3) 125 MCG (5000 UT) TABS Take 5,000 Units by mouth daily.    . clopidogrel (PLAVIX) 75 MG tablet Take 1 tablet (75 mg  total) by mouth daily. 90 tablet 1  . fluticasone (FLONASE) 50 MCG/ACT nasal spray 1 SPRAY EACH NOSTRIL FOLLOWING SINUS RINSES TWICE DAILY 48 mL 0  . levothyroxine (SYNTHROID) 175 MCG tablet TAKE 1 TABLET DAILY BEFORE BREAKFAST. 90 tablet 1  . metoprolol tartrate (LOPRESSOR) 25 MG tablet Take 1 tablet (25 mg total) by mouth 2 (two) times daily. 180 tablet 1  . Multiple Vitamins-Minerals (MULTIVITAMIN WITH  MINERALS) tablet Take 1 tablet by mouth daily.    . nitroGLYCERIN (NITROSTAT) 0.4 MG SL tablet Place 1 tablet (0.4 mg total) under the tongue every 5 (five) minutes as needed for chest pain. 25 tablet 3  . TRELEGY ELLIPTA 100-62.5-25 MCG/INH AEPB TAKE 1 PUFF BY MOUTH EVERY DAY 60 each 11  . Turmeric 500 MG CAPS Take 500 mg by mouth daily.    . Vitamin D, Ergocalciferol, (DRISDOL) 1.25 MG (50000 UT) CAPS capsule Take 1 capsule (50,000 Units total) by mouth every 7 (seven) days. 12 capsule 3   No facility-administered medications prior to visit.    Allergies  Allergen Reactions  . Penicillins Hives    Unknown, occurred as a child Has patient had a PCN reaction causing immediate rash, facial/tongue/throat swelling, SOB or lightheadedness with hypotension: Yes Has patient had a PCN reaction causing severe rash involving mucus membranes or skin necrosis: No Has patient had a PCN reaction that required hospitalization  Has patient had a PCN reaction occurring within the last 10 years: No If all of the above answers are "NO", then may proceed with Cephalosporin use. No    ROS Review of Systems    Objective:    Physical Exam  There were no vitals taken for this visit. Wt Readings from Last 3 Encounters:  09/15/19 140 lb 10.5 oz (63.8 kg)  09/02/19 138 lb 0.1 oz (62.6 kg)  08/05/19 134 lb 12.8 oz (61.1 kg)     Health Maintenance Due  Topic Date Due  . COVID-19 Vaccine (1) Never done  . COLONOSCOPY  Never done    There are no preventive care reminders to display for this  patient.  Lab Results  Component Value Date   TSH 1.790 11/11/2018   Lab Results  Component Value Date   WBC 7.7 02/25/2019   HGB 12.1 02/25/2019   HCT 38.2 02/25/2019   MCV 89.5 02/25/2019   PLT 209 02/25/2019   Lab Results  Component Value Date   NA 134 (L) 03/17/2019   K 4.3 03/17/2019   CO2 24 03/17/2019   GLUCOSE 83 03/17/2019   BUN 13 03/17/2019   CREATININE 0.64 03/17/2019   BILITOT 0.9 02/24/2019   ALKPHOS 101 02/24/2019   AST 47 (H) 02/24/2019   ALT 31 02/24/2019   PROT 7.5 02/24/2019   ALBUMIN 4.3 02/24/2019   CALCIUM 9.2 03/17/2019   ANIONGAP 12 03/17/2019   GFR 86.76 10/28/2014   Lab Results  Component Value Date   CHOL 133 02/25/2019   Lab Results  Component Value Date   HDL 61 02/25/2019   Lab Results  Component Value Date   LDLCALC 57 02/25/2019   Lab Results  Component Value Date   TRIG 74 02/25/2019   Lab Results  Component Value Date   CHOLHDL 2.2 02/25/2019   Lab Results  Component Value Date   HGBA1C 5.3 03/12/2018      Assessment & Plan:   Problem List Items Addressed This Visit    None      No orders of the defined types were placed in this encounter.   Follow-up: No follow-ups on file.    Lorrene Reid, PA-C

## 2019-09-30 ENCOUNTER — Telehealth (INDEPENDENT_AMBULATORY_CARE_PROVIDER_SITE_OTHER): Payer: Medicare Other | Admitting: Physician Assistant

## 2019-09-30 ENCOUNTER — Encounter: Payer: Self-pay | Admitting: Physician Assistant

## 2019-09-30 ENCOUNTER — Other Ambulatory Visit: Payer: Self-pay

## 2019-09-30 VITALS — Temp 97.5°F | Ht 66.0 in | Wt 138.0 lb

## 2019-09-30 DIAGNOSIS — K449 Diaphragmatic hernia without obstruction or gangrene: Secondary | ICD-10-CM

## 2019-09-30 NOTE — Progress Notes (Signed)
Telehealth office visit note for Joy Reid, PA-C- at [place] at [facility]   I connected with Joy Williams by telephone and verified that I am speaking with the correct person using two identifiers.   . Location of the patient: Home . Location of the provider: Office - This visit type was conducted due to national recommendations for restrictions regarding the COVID-19 Pandemic (e.g. social distancing) in an effort to limit this patient's exposure and mitigate transmission in our community.    - No physical exam could be performed with this format, beyond that communicated to [place] by the patient/ family members as noted.   - Additionally my office staff/ schedulers were to discuss with the patient that there may be a monetary charge related to this service, depending on their medical insurance.  My understanding is that patient understood and consented to proceed.     _________________________________________________________________________________   History of Present Illness: Pt calls in to discuss chest CT scan results, which revealed a small hiatal hernia. Pt has questions in regards to pathophysiology and management because she was unaware she had a hiatal hernia. Pt denies symptoms of acid reflux.    GAD 7 : Generalized Anxiety Score 11/16/2018 11/05/2018 01/22/2017  Nervous, Anxious, on Edge 0 0 0  Control/stop worrying 0 0 0  Worry too much - different things 0 0 0  Trouble relaxing 0 0 0  Restless 0 0 0  Easily annoyed or irritable 0 0 0  Afraid - awful might happen 0 0 0  Total GAD 7 Score 0 0 0  Anxiety Difficulty Not difficult at all Not difficult at all Not difficult at all    Depression screen [facility] 2/9 09/30/2019 09/02/2019 07/14/2019 11/16/2018 11/05/2018  Decreased Interest 0 0 0 0 0  Down, Depressed, Hopeless 0 0 0 0 0  PHQ - 2 Score 0 0 0 0 0  Altered sleeping 0 - 0 0 0  Tired, decreased energy 0 - 0 0 0  Change in appetite 0 - 0 0 0  Feeling bad or  failure about yourself  0 - 0 0 0  Trouble concentrating 0 - 0 0 0  Moving slowly or fidgety/restless 0 - 0 0 0  Suicidal thoughts 0 - 0 0 0  PHQ-9 Score 0 - 0 0 0  Difficult doing work/chores - - Not difficult at all Not difficult at all Not difficult at all  Some recent data might be hidden      Impression and Recommendations:     1. Hiatal hernia      Hiatal hernia: - Reassurance provided. - Discussed with patient no need for further testing or management given she is asymptomatic and CT shows size is small. - Advised to follow-up if starts developing acid reflux symptoms.  - Pt verbalized understanding and all questions were answered.    - As part of my medical decision making, I reviewed the following data within the [place] History obtained from pt /family, CMA notes reviewed and incorporated if applicable, Labs reviewed, Radiograph/ tests reviewed if applicable and OV notes from prior OV's with me, as well as any other specialists she/he has seen since seeing me last, were all reviewed and used in my medical decision making process today.    - Additionally, when appropriate, discussion had with patient regarding our treatment plan, and their biases/concerns about that plan were used in my medical decision making today.    - The  patient agreed with the plan and demonstrated an understanding of the instructions.   No barriers to understanding were identified.     - The patient was advised to call back or seek an in-person evaluation if the symptoms worsen or if the condition fails to improve as anticipated.   No follow-ups on file.    No orders of the defined types were placed in this encounter.   No orders of the defined types were placed in this encounter.   There are no discontinued medications.     Time spent on visit including pre-visit chart review and post-visit care was 8 minutes.      The [place] was signed into law  in 2016 which includes the topic of electronic health records.  This provides immediate access to information in MyChart.  This includes consultation notes, operative notes, office notes, lab results and pathology reports.  If you have any questions about what you read please let us know at your next visit or call us at the office.  We are right here with you.   __________________________________________________________________________________     Patient Care Team    Relationship Specialty Notifications Start End  Joy Williams, [place] PCP - General   09/19/19   Dorothy Spark, MD PCP - Cardiology Cardiology Admissions 02/25/19   Dorothy Spark, MD Consulting Physician Cardiology  01/06/17   Tanda Rockers, MD Consulting Physician Pulmonary Disease  01/06/17   Hurley Cisco, MD Consulting Physician Rheumatology  01/06/17   Lerry Paterson, MD Referring Physician   01/06/17    Comment: lung ca Surgeon  Lissa Morales, MD Referring Physician Internal Medicine  01/06/17    Comment: Oncologist- Chemo doc  [name], MD Consulting Physician Infectious Diseases  03/17/18    Comment: + RPR test     -Vitals obtained; medications/ allergies reconciled;  personal medical, social, Sx etc.histories were updated by CMA, reviewed by me and are reflected in chart   Patient Active Problem List   Diagnosis Date Noted  . Suppression of immune system - subtherapeutic ([place]) 07/14/2019  . History of TIA (transient ischemic attack) 05/10/2019  . Non-ST elevated myocardial infarction ([place]) 02/25/2019  . NSTEMI (non-ST elevated myocardial infarction) ([place]) 02/25/2019  . Elevated troponin level not due to acute coronary syndrome   . 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 On statin therapy 04/27/2018  . Memory loss or impairment 04/27/2018  . Complicated migraine 40/98/1191  . Hyperlipidemia LDL goal <70 03/12/2018  . 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 Stroke-like episode ([place]) s/p tPA 03/11/2018  . Dysuria 10/14/2017  . Abnormal finding on urinalysis 10/14/2017  . Pharyngitis 10/14/2017  . Closed nondisplaced fracture of base of fifth metacarpal bone of right hand 05/27/2017  . Decreased range of motion of right ankle 05/27/2017  . Pain and swelling of right wrist 05/27/2017  . Age-related osteoporosis without current pathological fracture 02/20/2017  . ASCUS of cervix with negative high risk HPV 01/29/2017  . 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 Tobacco use disorder 01/22/2017  . Tobacco abuse counseling 01/22/2017  . Neoplastic malignant related fatigue 01/22/2017  . Vitamin D insufficiency 01/22/2017  . Estrogen deficiency 01/14/2017  . H/O cardiac catheterization- in prep for lung ca sx 01/06/2017  . Hypothyroidism 01/06/2017  . Previous back surgeries 01/06/2017  . S/P minimally invasive mitral valve replacement with  bioprosthetic valve 06/27/2016  . Chronic diastolic congestive heart failure ([place])   . Chronic obstructive pulmonary disease ([place])   . 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 Antineoplastic chemotherapy induced anemia 05/05/2015  . History of epistaxis 03/30/2015  . Raynaud's disease 01/19/2015  . Malignant neoplasm of left lung ([place]) 12/30/2014  . Lung cancer ([place]) 12/30/2014  . Malignant neoplasm of overlapping sites of left lung ([place]) 12/06/2014  . Nonintractable episodic headache 12/06/2014  . Severe mitral regurgitation 11/15/2014  . 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 Asthmatic bronchitis , chronic ([place]) 10/03/2014  . Multiple pulmonary nodules 10/03/2014  . Current Cigarette smoker 10/03/2014     Current Meds  Medication Sig  . acetaminophen (TYLENOL) 500 MG tablet Take 1,000 mg by mouth every 6 (six) hours as needed for mild pain or headache.  Marland Kitchen aspirin EC 81 MG tablet Take 1 tablet (81 mg total) by mouth daily.  Marland Kitchen  atorvastatin (LIPITOR) 20 MG tablet Take 1 tablet (20 mg total) by mouth daily at 6 PM.  . azelastine (ASTELIN) 0.1 % nasal spray Place 2 sprays into both nostrils 2 (two) times daily. Use in each nostril as directed  . Cholecalciferol (VITAMIN D-3) 125 MCG (5000 UT) TABS Take 5,000 Units by mouth daily.  . clopidogrel (PLAVIX) 75 MG tablet Take 1 tablet (75 mg total) by mouth daily.  . fluticasone (FLONASE) 50 MCG/ACT nasal spray 1 SPRAY EACH NOSTRIL FOLLOWING SINUS RINSES TWICE DAILY  . levothyroxine (SYNTHROID) 175 MCG tablet TAKE 1 TABLET DAILY BEFORE BREAKFAST.  . metoprolol tartrate (LOPRESSOR) 25 MG tablet Take 1 tablet (25 mg total) by mouth 2 (two) times daily.  . Multiple Vitamins-Minerals (MULTIVITAMIN WITH MINERALS) tablet Take 1 tablet by mouth daily.  . nitroGLYCERIN (NITROSTAT) 0.4 MG SL tablet Place 1 tablet (0.4 mg total) under the tongue every 5 (five) minutes as needed for chest pain.  . TRELEGY ELLIPTA 100-62.5-25 MCG/INH AEPB TAKE 1 PUFF BY MOUTH EVERY DAY  . Turmeric 500 MG CAPS Take 500 mg by mouth daily.  . Vitamin D, Ergocalciferol, (DRISDOL) 1.25 MG (50000 UT) CAPS capsule Take 1 capsule (50,000 Units total) by mouth every 7 (seven) days.     Allergies:  Allergies  Allergen Reactions  . Penicillins Hives    Unknown, occurred as a child Has patient had a PCN reaction causing immediate rash, facial/tongue/throat swelling, SOB or lightheadedness with hypotension: Yes Has patient had a PCN reaction causing severe rash involving mucus membranes or skin necrosis: No Has patient had a PCN reaction that required hospitalization  Has patient had a PCN reaction occurring within the last 10 years: No If all of the above answers are "NO", then may proceed with Cephalosporin use. No     ROS:  See above HPI for pertinent positives and negatives   Objective:   Temperature (!) 97.5 F (36.4 C), height 5\' 6"  (1.676 m), weight 138 lb (62.6 kg).  (if some vitals are  omitted, this means that patient was UNABLE to obtain them even though they were asked to get them prior to OV today.  They were asked to call us at their earliest convenience with these once obtained. ) General: A & O * 3; sounds in no acute distress; in usual state of health.  Respiratory: speaking in full sentences, no conversational dyspnea Psych: insight appears good, mood- appears full

## 2019-10-01 ENCOUNTER — Encounter (HOSPITAL_COMMUNITY): Payer: Medicare Other

## 2019-10-04 ENCOUNTER — Encounter (HOSPITAL_COMMUNITY): Payer: Medicare Other

## 2019-10-06 ENCOUNTER — Encounter (HOSPITAL_COMMUNITY): Payer: Medicare Other

## 2019-10-08 ENCOUNTER — Encounter (HOSPITAL_COMMUNITY): Payer: Medicare Other

## 2019-10-11 ENCOUNTER — Encounter (HOSPITAL_COMMUNITY): Payer: Medicare Other

## 2019-10-13 ENCOUNTER — Encounter (HOSPITAL_COMMUNITY): Payer: Medicare Other

## 2019-10-15 ENCOUNTER — Encounter (HOSPITAL_COMMUNITY): Payer: Medicare Other

## 2019-10-20 ENCOUNTER — Encounter (HOSPITAL_COMMUNITY): Payer: Medicare Other

## 2019-10-22 ENCOUNTER — Encounter (HOSPITAL_COMMUNITY): Payer: Medicare Other

## 2019-10-25 ENCOUNTER — Encounter (HOSPITAL_COMMUNITY): Payer: Medicare Other

## 2019-10-27 ENCOUNTER — Encounter (HOSPITAL_COMMUNITY): Payer: Medicare Other

## 2019-10-29 ENCOUNTER — Encounter (HOSPITAL_COMMUNITY): Payer: Medicare Other

## 2019-11-01 ENCOUNTER — Ambulatory Visit: Payer: Medicare Other | Admitting: Physician Assistant

## 2019-11-06 ENCOUNTER — Other Ambulatory Visit: Payer: Self-pay | Admitting: Cardiology

## 2019-11-10 ENCOUNTER — Other Ambulatory Visit: Payer: Self-pay | Admitting: Cardiology

## 2019-11-10 DIAGNOSIS — E785 Hyperlipidemia, unspecified: Secondary | ICD-10-CM

## 2019-11-10 DIAGNOSIS — I5189 Other ill-defined heart diseases: Secondary | ICD-10-CM

## 2019-11-10 DIAGNOSIS — I6523 Occlusion and stenosis of bilateral carotid arteries: Secondary | ICD-10-CM

## 2019-11-10 DIAGNOSIS — Z952 Presence of prosthetic heart valve: Secondary | ICD-10-CM

## 2019-11-27 ENCOUNTER — Other Ambulatory Visit: Payer: Self-pay | Admitting: Family Medicine

## 2019-11-27 DIAGNOSIS — E039 Hypothyroidism, unspecified: Secondary | ICD-10-CM

## 2019-11-29 ENCOUNTER — Telehealth: Payer: Self-pay | Admitting: Physician Assistant

## 2019-11-29 DIAGNOSIS — E039 Hypothyroidism, unspecified: Secondary | ICD-10-CM

## 2019-11-29 MED ORDER — LEVOTHYROXINE SODIUM 175 MCG PO TABS: ORAL_TABLET | ORAL | 0 refills | Status: DC

## 2019-11-29 NOTE — Addendum Note (Signed)
Addended by: Fonnie Mu on: 11/29/2019 09:49 AM   Modules accepted: Orders

## 2019-11-29 NOTE — Telephone Encounter (Signed)
Patient is requesting a refill of her thyroid med, if approved please [facility] to [facility] [address].  Patient has been out of this med for a few days as the refill was routed to previous PCP, she is also going out of town tomorrow and is hoping we can expedite this refill.

## 2019-12-10 ENCOUNTER — Telehealth: Payer: Self-pay | Admitting: Cardiology

## 2019-12-10 MED ORDER — CLOPIDOGREL BISULFATE 75 MG PO TABS: 75.0000 mg | ORAL_TABLET | Freq: Every day | ORAL | 1 refills | Status: DC

## 2019-12-10 NOTE — Telephone Encounter (Signed)
*  STAT* If patient is at the pharmacy, call can be transferred to refill team.   1. Which medications need to be refilled? (please list name of each medication and dose if known)?   clopidogrel (PLAVIX) 75 MG tablet   2. Which pharmacy/location (including street and city if local pharmacy) is medication to be sent to? [id number] [place] [place] [address].  3. Do they need a 30 day or 90 day supply?  90 day

## 2019-12-10 NOTE — Telephone Encounter (Signed)
Pt's medication was sent to pt's pharmacy as requested. Confirmation received.  °

## 2019-12-15 ENCOUNTER — Ambulatory Visit (INDEPENDENT_AMBULATORY_CARE_PROVIDER_SITE_OTHER): Payer: Medicare Other | Admitting: Physician Assistant

## 2019-12-15 ENCOUNTER — Other Ambulatory Visit: Payer: Self-pay

## 2019-12-15 ENCOUNTER — Encounter: Payer: Self-pay | Admitting: Physician Assistant

## 2019-12-15 DIAGNOSIS — Z23 Encounter for immunization: Secondary | ICD-10-CM | POA: Diagnosis not present

## 2019-12-15 DIAGNOSIS — Z0000 Encounter for general adult medical examination without abnormal findings: Secondary | ICD-10-CM | POA: Diagnosis not present

## 2019-12-15 DIAGNOSIS — Z1231 Encounter for screening mammogram for malignant neoplasm of breast: Secondary | ICD-10-CM

## 2019-12-15 DIAGNOSIS — Z780 Asymptomatic menopausal state: Secondary | ICD-10-CM

## 2019-12-15 DIAGNOSIS — Z1211 Encounter for screening for malignant neoplasm of colon: Secondary | ICD-10-CM

## 2019-12-15 MED ORDER — PNEUMOCOCCAL VAC POLYVALENT 25 MCG/0.5ML IJ INJ: 0.5000 mL | INJECTION | INTRAMUSCULAR | 0 refills | Status: AC

## 2019-12-15 NOTE — Progress Notes (Signed)
Virtual Visit via Telephone Note:  I connected with Joy Williams by telephone and verified that I am speaking with the correct person using two identifiers.    I discussed the limitations, risks, security and privacy concerns for performing an evaluation and management service by telephone and the availability of in person appointments. The staff discussed with patient that there may be a patient responsible charge related to this service. The patient expressed understanding and agreed to proceed.   Location of Patient- Home Location of Provider- Office    Subjective:   Joy Williams is a 69 y.o. female who presents for Medicare Annual (Subsequent) preventive examination.  Review of Systems    Review of Systems: General:   No F/C, wt loss Pulm:   No DIB, SOB, pleuritic chest pain Card:  No CP, palpitations Abd:  No n/v/d or pain Ext:  No inc edema from baseline     Objective:    Today's Vitals   There is no height or weight on file to calculate BMI.  Advanced Directives 02/24/2019 03/11/2018 03/11/2018 05/24/2017 01/06/2017 08/27/2016 06/28/2016  Does Patient Have a Medical Advance Directive? Yes Yes No Yes Yes Yes Yes  Type of Paramedic of [place];Living will Living will - Living will [place];Living will;Out of facility DNR (pink MOST or yellow form) - [place];Living will  Does patient want to make changes to medical advance directive? - No - Patient declined - - - No - Patient declined No - Patient declined  Copy of [place] in Chart? No - copy requested, Physician notified - - - - - -  Would patient like information on creating a medical advance directive? No - Patient declined - No - Patient declined - - - -    Current Medications (verified) Outpatient Encounter Medications as of 12/15/2019  Medication Sig  . acetaminophen (TYLENOL) 500 MG tablet Take 1,000 mg by mouth every 6 (six)  hours as needed for mild pain or headache.  Marland Kitchen aspirin EC 81 MG tablet Take 1 tablet (81 mg total) by mouth daily.  Marland Kitchen atorvastatin (LIPITOR) 20 MG tablet TAKE 1 TABLET (20 MG TOTAL) BY MOUTH DAILY AT 6 PM.  . azelastine (ASTELIN) 0.1 % nasal spray Place 2 sprays into both nostrils 2 (two) times daily. Use in each nostril as directed  . Cholecalciferol (VITAMIN D-3) 125 MCG (5000 UT) TABS Take 5,000 Units by mouth daily.  . clopidogrel (PLAVIX) 75 MG tablet Take 1 tablet (75 mg total) by mouth daily.  . fluticasone (FLONASE) 50 MCG/ACT nasal spray 1 SPRAY EACH NOSTRIL FOLLOWING SINUS RINSES TWICE DAILY  . levothyroxine (SYNTHROID) 175 MCG tablet TAKE 1 TABLET DAILY BEFORE BREAKFAST.  . metoprolol tartrate (LOPRESSOR) 25 MG tablet TAKE 1 TABLET BY MOUTH TWICE A DAY  . Multiple Vitamins-Minerals (MULTIVITAMIN WITH MINERALS) tablet Take 1 tablet by mouth daily.  . nitroGLYCERIN (NITROSTAT) 0.4 MG SL tablet Place 1 tablet (0.4 mg total) under the tongue every 5 (five) minutes as needed for chest pain.  . TRELEGY ELLIPTA 100-62.5-25 MCG/INH AEPB TAKE 1 PUFF BY MOUTH EVERY DAY (Patient taking differently: Inhale 2 puffs into the lungs daily. )  . Turmeric 500 MG CAPS Take 500 mg by mouth daily.  . Vitamin D, Ergocalciferol, (DRISDOL) 1.25 MG (50000 UT) CAPS capsule Take 1 capsule (50,000 Units total) by mouth every 7 (seven) days.   No facility-administered encounter medications on file as of 12/15/2019.  Allergies (verified) Penicillins   History: Past Medical History:  Diagnosis Date  . Allergy   . Asthma    pt denies this, but is on Dulera  . Chronic diastolic congestive heart failure ([place])   . 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 COPD (chronic obstructive pulmonary disease) ([place])   . Family history of adverse reaction to anesthesia    Mother- very sensitive to medication  . Heart  murmur    MVP  . Hypothyroidism   . 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 MVP (mitral valve prolapse)   . PONV (postoperative nausea and vomiting)   . Raynaud's disease    Raynaud's  . Raynaud's syndrome   . 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 Severe mitral regurgitation 11/15/2014  . Shortness of breath dyspnea    with exertion  . STD (sexually transmitted disease)   . Telangiectasia   . Thyroid disease    Past Surgical History:  Procedure Laterality Date  . BACK SURGERY     x 3  Disectomy  . BREAST BIOPSY Left   . CARDIAC CATHETERIZATION N/A 03/08/2016   Procedure: Right/Left Heart Cath and Coronary Angiography;  Surgeon: Sherren Mocha, MD;  Location: [place] [facility];  Service: Cardiovascular;  Laterality: N/A;  . CLAVICLE SURGERY Left 2013   plate to left collar bone  . LAPAROSCOPY     ? reason-age 2   . LEFT HEART CATH AND CORONARY ANGIOGRAPHY N/A 02/25/2019   Procedure: LEFT HEART CATH AND CORONARY ANGIOGRAPHY;  Surgeon: Jettie Booze, MD;  Location: [place] [facility];  Service: Cardiovascular;  Laterality: N/A;  . LUNG CANCER SURGERY    . MITRAL VALVE REPAIR Right 06/27/2016   Procedure: MINIMALLY INVASIVE MITRAL VALVE REPLACEMENT;  Surgeon: Rexene Alberts, MD;  Location: [place];  Service: Open Heart Surgery;  Laterality: Right;  . TEE WITHOUT CARDIOVERSION N/A 02/22/2016   Procedure: TRANSESOPHAGEAL ECHOCARDIOGRAM (TEE);  Surgeon: Dorothy Spark, MD;  Location: [place];  Service: Cardiovascular;  Laterality: N/A;  . TEE WITHOUT CARDIOVERSION N/A 06/27/2016   Procedure: TRANSESOPHAGEAL ECHOCARDIOGRAM (TEE);  Surgeon: Rexene Alberts, MD;  Location: [place];  Service:  Open Heart Surgery;  Laterality: N/A;  . TONSILLECTOMY    . VIDEO ASSISTED THORACOSCOPY (VATS)/WEDGE RESECTION Left 12/30/2014   Bronchoscopy, Mediastinoscopy, Left VATS for Wedge resection LUL x2 adn LLL x1 - Dr. Elenor Quinones at [facility]  . VOCAL CORD INJECTION Left 2017   injected with botox  . wrist surgery Left 2015   plate to wrist    Family History  Problem Relation Age of Onset  . Mitral valve prolapse Mother   . Dementia Father   . Prostate cancer Father   . Mitral valve prolapse Brother   . Mitral valve prolapse Sister   . Colon cancer Neg Hx   . Colon polyps Neg Hx   . Rectal cancer Neg Hx   . Esophageal cancer Neg Hx   . Stomach cancer Neg Hx   . Heart attack Neg Hx    Social History   Socioeconomic History  . Marital status:  Divorced    Spouse name: Not on file  . Number of children: Not on file  . Years of education: 28  . Highest education level: Not on file  Occupational History  . Occupation: Retired  Tobacco Use  . Smoking status: Former Smoker    Packs/day: 0.25    Years: 24.00    Pack years: 6.00    Types: E-cigarettes, Cigarettes    Start date: 05/21/1983    Quit date: 08/26/2019    Years since quitting: 0.3  . Smokeless tobacco: Never Used  . Tobacco comment: Patient is interested in trying nicotene gum  Vaping Use  . Vaping Use: Former  . Quit date: 05/20/2006  Substance and Sexual Activity  . Alcohol use: Yes    Alcohol/week: 2.0 standard drinks    Types: 1 Shots of liquor, 1 Standard drinks or equivalent per week  . Drug use: No  . Sexual activity: Never    Partners: Male    Birth control/protection: Post-menopausal  Other Topics Concern  . Not on file  Social History Narrative  . Not on file   Social Determinants of Health   Financial Resource Strain:   . Difficulty of Paying Living Expenses:   Food Insecurity:   . Worried About Charity fundraiser in the Last Year:   . Arboriculturist in the Last Year:   Transportation Needs:   . Lexicographer (Medical):   Marland Kitchen Lack of Transportation (Non-Medical):   Physical Activity:   . Days of Exercise per Week:   . Minutes of Exercise per Session:   Stress:   . Feeling of Stress :   Social Connections:   . Frequency of Communication with Friends and Family:   . Frequency of Social Gatherings with Friends and Family:   . Attends Religious Services:   . Active Member of Clubs or Organizations:   . Attends Archivist Meetings:   Marland Kitchen Marital Status:     Tobacco Counseling Counseling given: Not Answered Comment: Patient is interested in trying nicotene gum    Diabetic? No         Activities of Daily Living In your present state of health, do you have any difficulty performing the following activities: 12/15/2019 09/30/2019  Hearing? Tempie Donning  Vision? N N  Difficulty concentrating or making decisions? N N  Walking or climbing stairs? N N  Dressing or bathing? N N  Doing errands, shopping? N N  Some recent data might be hidden    Patient Care Team: Lorrene Reid, PA-C as PCP - General Dorothy Spark, MD as PCP - Cardiology (Cardiology) Dorothy Spark, MD as Consulting Physician (Cardiology) Tanda Rockers, MD as Consulting Physician (Pulmonary Disease) Hurley Cisco, MD as Consulting Physician (Rheumatology) Lerry Paterson, MD as Referring Physician Lissa Morales, MD as Referring Physician (Internal Medicine) [name], MD as Consulting Physician (Infectious Diseases)  Indicate any recent Medical Services you may have received from other than [facility] providers in the past year (date may be approximate).     Assessment:   This is a routine wellness examination for Joy Williams.  Hearing/Vision screen No exam data present  Dietary issues and exercise activities discussed:  - Follow heart healthy diet. -Continue with staying as active as possible with goal of 6000 steps/day.  Goals   None    Depression Screen PHQ 2/9 Scores  12/15/2019 09/30/2019 09/02/2019 07/14/2019 11/16/2018 11/05/2018 04/27/2018  PHQ - 2 Score 0 0 0 0  0 0 0  PHQ- 9 Score 0 0 - 0 0 0 0    Fall Risk Fall Risk  12/15/2019 04/27/2018 01/22/2017 01/06/2017 08/27/2016  Falls in the past year? 0 0 No No No  Risk for fall due to : No Fall Risks - - - -  Follow up Falls evaluation completed - - - -    Any stairs in or around the home? No  If so, are there any without handrails? Not Applicable Home free of loose throw rugs in walkways, pet beds, electrical cords, etc? Yes  Adequate lighting in your home to reduce risk of falls? Yes   ASSISTIVE DEVICES UTILIZED TO PREVENT FALLS:  Life alert? No  Use of a cane, walker or w/c? No  Grab bars in the bathroom? No  Shower chair or bench in shower? No  Elevated toilet seat or a handicapped toilet? No   TIMED UP AND GO:  Was the test performed? No .  Length of time to ambulate 10 feet: Telehealth visit   Cognitive Function: WNL     6CIT Screen 04/27/2018 01/22/2017  What Year? 0 points 0 points  What month? 0 points 0 points  What time? 0 points 0 points  Count back from 20 0 points 0 points  Months in reverse 0 points 0 points  Repeat phrase 2 points 0 points  Total Score 2 0    Immunizations Immunization History  Administered Date(s) Administered  . Fluad Quad(high Dose 65+) 02/18/2019  . Influenza Split 02/18/2015  . Influenza, High Dose Seasonal PF 02/20/2017, 04/27/2018  . Influenza,inj,Quad PF,6+ Mos 03/16/2015  . Pneumococcal Conjugate-13 01/22/2017  . Tdap 09/26/2015  . Zoster Recombinat (Shingrix) 05/10/2018, 03/03/2019    TDAP status: Up to date Flu Vaccine status: Up to date Pneumococcal vaccine status: Completed during today's visit.  RX sent to pharmacy Covid-19 vaccine status: Completed vaccines  Qualifies for Shingles Vaccine? Yes   Zostavax completed No   Shingrix Completed?: Yes  Screening Tests Health Maintenance  Topic Date Due  . COVID-19 Vaccine (1) Never done   . COLONOSCOPY  Never done  . PNA vac Low Risk Adult (2 of 2 - PPSV23) 01/22/2018  . INFLUENZA VACCINE  12/19/2019  . MAMMOGRAM  07/02/2020  . TETANUS/TDAP  09/25/2025  . DEXA SCAN  Completed  . Hepatitis C Screening  Completed    Health Maintenance  Health Maintenance Due  Topic Date Due  . COVID-19 Vaccine (1) Never done  . COLONOSCOPY  Never done  . PNA vac Low Risk Adult (2 of 2 - PPSV23) 01/22/2018    Colorectal cancer screening: Referral to GI placed Placed order for Cologuard. Pt aware the office will call re: appt. Mammogram status: Ordered to [place]. Pt provided with contact info and advised to call to schedule appt.  Bone Density status: Ordered Chiropod[facility]. Pt provided with contact info and advised to call to schedule appt.  Lung Cancer Screening: (Low Dose CT Chest recommended if Age 59-80 years, 30 pack-year currently smoking OR have quit w/in 15years.) does not qualify.   Additional Screening:  Hepatitis C Screening: does qualify; Completed 01/22/2017  Vision Screening: Recommended annual ophthalmology exams for early detection of glaucoma and other disorders of the eye. Is the patient up to date with their annual eye exam?  Yes Who is the provider or what is the name of the office in which the patient attends annual eye  exams? [facility]  If pt is not established with a provider, would they like to be referred to a provider to establish care? N/A.   Dental Screening: Recommended annual dental exams for proper oral hygiene  Community Resource Referral / Chronic Care Management: CRR required this visit?  No   CCM required this visit?  No      Plan:  -Continue current medication regimen. -Continue to follow-up with various specialists. -Continue to abstain from tobacco use. -Stay well hydrated, at least 64 fl oz -Follow-up in 4 months for regular office visit: HLD,  hypothyroid -Schedule lab visit for fasting blood work within the next few weeks  I have personally reviewed and noted the following in the patient's chart:   . Medical and social history . Use of alcohol, tobacco or illicit drugs  . Current medications and supplements . Functional ability and status . Nutritional status . Physical activity . Advanced directives . List of other physicians . Hospitalizations, surgeries, and ER visits in previous 12 months . Vitals . Screenings to include cognitive, depression, and falls . Referrals and appointments  In addition, I have reviewed and discussed with patient certain preventive protocols, quality metrics, and best practice recommendations. A written personalized care plan for preventive services as well as general preventive health recommendations were provided to patient.   Note:  This note was prepared with assistance of Dragon voice recognition software. Occasional wrong-word or sound-a-like substitutions may have occurred due to the inherent limitations of voice recognition software.      12/15/2019   Nurse Notes: none

## 2019-12-16 ENCOUNTER — Other Ambulatory Visit: Payer: Self-pay

## 2019-12-16 ENCOUNTER — Ambulatory Visit
Admission: RE | Admit: 2019-12-16 | Discharge: 2019-12-16 | Disposition: A | Discharge status: Home / Self Care | Payer: Medicare Other | Source: Ambulatory Visit | Attending: Physician Assistant | Admitting: Physician Assistant

## 2019-12-16 ENCOUNTER — Encounter: Payer: Self-pay | Admitting: Emergency Medicine

## 2019-12-16 ENCOUNTER — Ambulatory Visit (INDEPENDENT_AMBULATORY_CARE_PROVIDER_SITE_OTHER)
Admission: EM | Admit: 2019-12-16 | Discharge: 2019-12-16 | Disposition: A | Discharge status: Home / Self Care | Payer: Medicare Other | Source: Home / Self Care

## 2019-12-16 DIAGNOSIS — L03032 Cellulitis of left toe: Secondary | ICD-10-CM

## 2019-12-16 MED ORDER — DOXYCYCLINE HYCLATE 100 MG PO CAPS
100.0000 mg | ORAL_CAPSULE | Freq: Two times a day (BID) | ORAL | 0 refills | Status: DC
Start: 2019-12-16 — End: 2020-02-08

## 2019-12-16 MED ORDER — MUPIROCIN 2 % EX OINT: 1.0000 "application " | TOPICAL_OINTMENT | Freq: Two times a day (BID) | CUTANEOUS | 0 refills | Status: DC

## 2019-12-16 NOTE — ED Triage Notes (Signed)
Pt presents to [facility] for assessment after a doberman stepped on her left foot yesterday.  Redness and swelling to great toe noted today.

## 2019-12-16 NOTE — ED Notes (Signed)
Patient able to ambulate independently  

## 2019-12-16 NOTE — Discharge Instructions (Signed)
Start doxycycline as directed. Keep wound clean and dry. You can clean gently with soap and water. Do not soak area in water. Monitor for spreading redness, increased warmth, increased swelling, fever, follow up for reevaluation needed.

## 2019-12-16 NOTE — ED Provider Notes (Signed)
[facility]    CSN: [id number] Arrival date & time: 12/16/19  1544      History   Chief Complaint Chief Complaint  Patient presents with  . Foot Pain    HPI Joy Williams is a 69 y.o. female.   69 year old female comes in for left great toe pain, redness, warmth after injury yesterday. States large dog stepped on the left foot. Pain with redness. Denies fever.  History of lung cancer, currently monitoring without active chemo/radiation.     Past Medical History:  Diagnosis Date  . Allergy   . Asthma    pt denies this, but is on Dulera  . Chronic diastolic congestive heart failure ([place])   . 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 COPD (chronic obstructive pulmonary disease) ([place])   . Family history of adverse reaction to anesthesia    Mother- very sensitive to medication  . Heart murmur    MVP  . Hypothyroidism   . Lung cancer ([place]) 12/30/2014   Synchronous primaries:  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, each treated by wedge resection with post[facility]op adjuvant [facility]Rx at [facility]  . MVP (mitral valve prolapse)   . PONV (postoperative nausea and vomiting)   . Raynaud's disease    Raynaud's  . Raynaud's syndrome   . 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 Severe mitral regurgitation 11/15/2014  . Shortness of breath dyspnea    with exertion  . STD (sexually transmitted disease)   . Telangiectasia   . Thyroid disease     Patient Active Problem List   Diagnosis Date Noted  . Suppression of immune system - subtherapeutic ([place]) 07/14/2019  . History of TIA (transient ischemic attack) 05/10/2019  . Non-ST elevated myocardial infarction ([place]) 02/25/2019  . NSTEMI (non-ST elevated myocardial  infarction) ([place]) 02/25/2019  . Elevated troponin level not due to acute coronary syndrome   . 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 On statin therapy 04/27/2018  . Memory loss or impairment 04/27/2018  . Complicated migraine 57/84/6962  . Hyperlipidemia LDL goal <70 03/12/2018  . 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 Stroke-like episode ([place]) s/p tPA 03/11/2018  . Dysuria 10/14/2017  . Abnormal finding on urinalysis 10/14/2017  . Pharyngitis 10/14/2017  . Closed nondisplaced fracture of base of fifth metacarpal bone of right hand 05/27/2017  . Decreased range of motion of right ankle 05/27/2017  . Pain and swelling of right wrist 05/27/2017  . Age-related osteoporosis without current pathological fracture 02/20/2017  . ASCUS of cervix with negative high risk HPV 01/29/2017  . 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 Tobacco use disorder 01/22/2017  . Tobacco abuse counseling 01/22/2017  . Neoplastic malignant related fatigue 01/22/2017  . Vitamin D insufficiency 01/22/2017  . Estrogen deficiency 01/14/2017  . H/O cardiac catheterization- in prep for lung ca sx 01/06/2017  . Hypothyroidism 01/06/2017  . Previous back surgeries 01/06/2017  . 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 Chronic diastolic congestive heart failure ([place])   . Chronic obstructive pulmonary disease ([place])   . 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  Antineoplastic chemotherapy induced anemia 05/05/2015  . History of epistaxis 03/30/2015  . Raynaud's disease 01/19/2015  . Malignant neoplasm of left lung ([place]) 12/30/2014  . Lung cancer ([place]) 12/30/2014  . Malignant neoplasm of overlapping sites of left lung ([place]) 12/06/2014  . Nonintractable episodic headache 12/06/2014  . Severe mitral regurgitation 11/15/2014  . Scleroderma ([place]) 10/03/2014  .  Asthmatic bronchitis , chronic ([place]) 10/03/2014  . Multiple pulmonary nodules 10/03/2014  . Current Cigarette smoker 10/03/2014    Past Surgical History:  Procedure Laterality Date  . BACK SURGERY     x 3  Disectomy  . BREAST BIOPSY Left   . CARDIAC CATHETERIZATION N/A 03/08/2016   Procedure: Right/Left Heart Cath and Coronary Angiography;  Surgeon: Sherren Mocha, MD;  Location: [facility];  Service: Cardiovascular;  Laterality: N/A;  . CLAVICLE SURGERY Left 2013   plate to left collar bone  . LAPAROSCOPY     ? reason-age 20   . LEFT HEART CATH AND CORONARY ANGIOGRAPHY N/A 02/25/2019   Procedure: LEFT HEART CATH AND CORONARY ANGIOGRAPHY;  Surgeon: Jettie Booze, MD;  Location: [place] [facility];  Service: Cardiovascular;  Laterality: N/A;  . LUNG CANCER SURGERY    . MITRAL VALVE REPAIR Right 06/27/2016   Procedure: MINIMALLY INVASIVE MITRAL VALVE REPLACEMENT;  Surgeon: Rexene Alberts, MD;  Location: [place];  Service: Open Heart Surgery;  Laterality: Right;  . TEE WITHOUT CARDIOVERSION N/A 02/22/2016   Procedure: TRANSESOPHAGEAL ECHOCARDIOGRAM (TEE);  Surgeon: Dorothy Spark, MD;  Location: [place];  Service: Cardiovascular;  Laterality: N/A;  . TEE WITHOUT CARDIOVERSION N/A 06/27/2016   Procedure: TRANSESOPHAGEAL ECHOCARDIOGRAM (TEE);  Surgeon: Rexene Alberts, MD;  Location: [place];  Service: Open Heart Surgery;  Laterality: N/A;  . TONSILLECTOMY    . VIDEO ASSISTED THORACOSCOPY (VATS)/WEDGE RESECTION Left 12/30/2014   Bronchoscopy, Mediastinoscopy, Left VATS for Wedge resection LUL x2 adn LLL x1 - Dr. Elenor Quinones at [facility]  . VOCAL CORD INJECTION Left 2017   injected with botox  . wrist surgery Left 2015   plate to wrist     OB History    Gravida  3   Para  2   Term  2   Preterm      AB  1   Living  2     SAB      TAB      Ectopic      Multiple      Live Births  2            Home Medications    Prior to Admission medications     Medication Sig Start Date End Date Taking? Authorizing Provider  acetaminophen (TYLENOL) 500 MG tablet Take 1,000 mg by mouth every 6 (six) hours as needed for mild pain or headache.    [provider]  aspirin EC 81 MG tablet Take 1 tablet (81 mg total) by mouth daily. 03/24/19   Dorothy Spark, MD  atorvastatin (LIPITOR) 20 MG tablet TAKE 1 TABLET (20 MG TOTAL) BY MOUTH DAILY AT 6 PM. 11/10/19   Dorothy Spark, MD  azelastine (ASTELIN) 0.1 % nasal spray Place 2 sprays into both nostrils 2 (two) times daily. Use in each nostril as directed 11/05/18   Opalski, Neoma Laming, DO  Cholecalciferol (VITAMIN D-3) 125 MCG (5000 UT) TABS Take 5,000 Units by mouth daily.    [provider]  clopidogrel (PLAVIX) 75 MG tablet Take 1 tablet (75 mg total) by mouth  daily. 12/10/19   Dorothy Spark, MD  doxycycline (VIBRAMYCIN) 100 MG capsule Take 1 capsule (100 mg total) by mouth 2 (two) times daily. 12/16/19   Tasia Catchings, Aldahir Litaker V, PA-C  fluticasone (FLONASE) 50 MCG/ACT nasal spray 1 SPRAY EACH NOSTRIL FOLLOWING SINUS RINSES TWICE DAILY 08/16/19   Opalski, Neoma Laming, DO  levothyroxine (SYNTHROID) 175 MCG tablet TAKE 1 TABLET DAILY BEFORE BREAKFAST. 11/29/19   Abonza, Maritza, PA-C  metoprolol tartrate (LOPRESSOR) 25 MG tablet TAKE 1 TABLET BY MOUTH TWICE A DAY 11/08/19   Dorothy Spark, MD  Multiple Vitamins-Minerals (MULTIVITAMIN WITH MINERALS) tablet Take 1 tablet by mouth daily.    [provider]  mupirocin ointment (BACTROBAN) 2 % Apply 1 application topically 2 (two) times daily. 12/16/19   Tasia Catchings, Ashaad Gaertner V, PA-C  nitroGLYCERIN (NITROSTAT) 0.4 MG SL tablet Place 1 tablet (0.4 mg total) under the tongue every 5 (five) minutes as needed for chest pain. 03/01/19   Weaver, Scott T, PA-C  TRELEGY ELLIPTA 100-62.5-25 MCG/INH AEPB TAKE 1 PUFF BY MOUTH EVERY DAY Patient taking differently: Inhale 2 puffs into the lungs daily.  09/13/19   Tanda Rockers, MD  Turmeric 500 MG CAPS Take 500 mg by mouth  daily.    [provider]  Vitamin D, Ergocalciferol, (DRISDOL) 1.25 MG (50000 UT) CAPS capsule Take 1 capsule (50,000 Units total) by mouth every 7 (seven) days. 04/21/19   Mellody Dance, DO    Family History Family History  Problem Relation Age of Onset  . Mitral valve prolapse Mother   . Dementia Father   . Prostate cancer Father   . Mitral valve prolapse Brother   . Mitral valve prolapse Sister   . Colon cancer Neg Hx   . Colon polyps Neg Hx   . Rectal cancer Neg Hx   . Esophageal cancer Neg Hx   . Stomach cancer Neg Hx   . Heart attack Neg Hx     Social History Social History   Tobacco Use  . Smoking status: Former Smoker    Packs/day: 0.25    Years: 24.00    Pack years: 6.00    Types: E-cigarettes, Cigarettes    Start date: 05/21/1983    Quit date: 08/26/2019    Years since quitting: 0.3  . Smokeless tobacco: Never Used  . Tobacco comment: Patient is interested in trying nicotene gum  Vaping Use  . Vaping Use: Former  . Quit date: 05/20/2006  Substance Use Topics  . Alcohol use: Yes    Alcohol/week: 2.0 standard drinks    Types: 1 Shots of liquor, 1 Standard drinks or equivalent per week  . Drug use: No     Allergies   Penicillins   Review of Systems Review of Systems  Reason unable to perform ROS: See HPI as above.     Physical Exam Triage Vital Signs ED Triage Vitals  Enc Vitals Group     BP 12/16/19 1551 (!) 97/61     Pulse Rate 12/16/19 1551 70     Resp 12/16/19 1551 18     Temp 12/16/19 1551 97.7 F (36.5 C)     Temp Source 12/16/19 1551 Oral     SpO2 12/16/19 1551 96 %     Weight --      Height --      Head Circumference --      Peak Flow --      Pain Score 12/16/19 1550 10     Pain Loc --  Pain Edu? --      Excl. in [place]? --    No data found.  Updated Vital Signs BP (!) 97/61 (BP Location: Right Arm)   Pulse 70   Temp 97.7 F (36.5 C) (Oral)   Resp 18   SpO2 96%   Physical Exam Constitutional:      General: She  is not in acute distress.    Appearance: Normal appearance. She is well-developed. She is not toxic-appearing or diaphoretic.  HENT:     Head: Normocephalic and atraumatic.  Eyes:     Conjunctiva/sclera: Conjunctivae normal.     Pupils: Pupils are equal, round, and reactive to light.  Pulmonary:     Effort: Pulmonary effort is normal. No respiratory distress.  Musculoskeletal:     Cervical back: Normal range of motion and neck supple.     Comments: Left great toe swelling with erythema that extends to dorsal aspect of the foot. No streaking. Paronychia to the lateral nailbed noted. Tenderness to the great toe. Full ROM of ankle, toes. Pedal pulse 2+, cap refill <2s  Skin:    General: Skin is warm and dry.  Neurological:     Mental Status: She is alert and oriented to person, place, and time.      UC Treatments / Results  Labs (all labs ordered are listed, but only abnormal results are displayed) Labs Reviewed - No data to display  EKG   Radiology No results found.  Procedures Incision and Drainage  Date/Time: 12/16/2019 4:22 PM Performed by: Ok Edwards, PA-C Authorized by: Ok Edwards, PA-C   Consent:    Consent obtained:  Verbal   Consent given by:  Patient   Risks discussed:  Bleeding, incomplete drainage, infection and pain   Alternatives discussed:  Alternative treatment Location:    Type:  Abscess   Location:  Lower extremity   Lower extremity location:  Toe   Toe location:  L big toe Anesthesia (see MAR for exact dosages):    Anesthesia method:  None Procedure type:    Complexity:  Simple Procedure details:    Incision types:  Single straight   Incision depth:  Dermal   Scalpel blade:  11   Drainage:  Purulent   Drainage amount:  Scant   Wound treatment:  Wound left open   Packing materials:  None Post-procedure details:    Patient tolerance of procedure:  Tolerated well, no immediate complications   (including critical care time)  Medications  Ordered in UC Medications - No data to display  Initial Impression / Assessment and Plan / UC Course  I have reviewed the triage vital signs and the nursing notes.  Pertinent labs & imaging results that were available during my care of the patient were reviewed by me and considered in my medical decision making (see chart for details).    Patient tolerated procedure well. Start doxycycline for surrounding cellulitis. Wound care instructions given. Return precautions given. Patient expresses understanding and agrees to plan.   Final Clinical Impressions(s) / UC Diagnoses   Final diagnoses:  Paronychia of great toe of left foot  Cellulitis of toe of left foot    ED Prescriptions    Medication Sig Dispense Auth. Provider   doxycycline (VIBRAMYCIN) 100 MG capsule Take 1 capsule (100 mg total) by mouth 2 (two) times daily. 14 capsule Jane Birkel V, PA-C   mupirocin ointment (BACTROBAN) 2 % Apply 1 application topically 2 (two) times daily. 22 g Tasia Catchings, [place]  V, PA-C     PDMP not reviewed this encounter.   Ok Edwards, PA-C 12/16/19 1623

## 2019-12-23 ENCOUNTER — Other Ambulatory Visit: Payer: Self-pay | Admitting: Family Medicine

## 2019-12-23 DIAGNOSIS — J310 Chronic rhinitis: Secondary | ICD-10-CM

## 2019-12-23 DIAGNOSIS — R067 Sneezing: Secondary | ICD-10-CM

## 2019-12-27 ENCOUNTER — Telehealth: Payer: Self-pay | Admitting: Physician Assistant

## 2019-12-27 DIAGNOSIS — J310 Chronic rhinitis: Secondary | ICD-10-CM

## 2019-12-27 DIAGNOSIS — R067 Sneezing: Secondary | ICD-10-CM

## 2019-12-27 MED ORDER — AZELASTINE HCL 0.1 % NA SOLN: 2.0000 | Freq: Two times a day (BID) | NASAL | 12 refills | Status: DC

## 2019-12-27 NOTE — Telephone Encounter (Signed)
Patient is requesting a refill of her azelastine nasal spray, if approved please send to [facility] on [address].

## 2019-12-27 NOTE — Addendum Note (Signed)
Addended by: Fonnie Mu on: 12/27/2019 09:59 AM   Modules accepted: Orders

## 2020-01-25 ENCOUNTER — Ambulatory Visit: Payer: Self-pay | Attending: Internal Medicine

## 2020-01-25 DIAGNOSIS — Z23 Encounter for immunization: Secondary | ICD-10-CM

## 2020-01-25 NOTE — Progress Notes (Signed)
   Covid-19 Vaccination Clinic  Name:  Joy Williams    MRN: [id number] DOB: 28-Aug-1950  01/25/2020  Joy Williams was observed post Covid-19 immunization for 15 minutes without incident. She was provided with Vaccine Information Sheet and instruction to access the V-Safe system.   Joy Williams was instructed to call 911 with any severe reactions post vaccine: Marland Kitchen Difficulty breathing  . Swelling of face and throat  . A fast heartbeat  . A bad rash all over body  . Dizziness and weakness

## 2020-02-08 ENCOUNTER — Encounter: Payer: Self-pay | Admitting: Plastic Surgery

## 2020-02-08 ENCOUNTER — Ambulatory Visit (INDEPENDENT_AMBULATORY_CARE_PROVIDER_SITE_OTHER): Payer: Self-pay | Admitting: Plastic Surgery

## 2020-02-08 ENCOUNTER — Other Ambulatory Visit: Payer: Self-pay

## 2020-02-08 DIAGNOSIS — Z719 Counseling, unspecified: Secondary | ICD-10-CM | POA: Insufficient documentation

## 2020-02-08 NOTE — Progress Notes (Signed)
The patient is a 69 year old female here for evaluation of her face.  She is interested in possible Botox and filler.  She would like to have less wrinkling and more rejuvenation of the perioral area.  She has telangiectasias and red-blue discoloration of her midface.  She states that she thinks this is related to her anticoagulation for her artificial heart valve.  She does have a lot of wrinkling in the perioral area and they are deep lines.  She is a candidate for filler and Botox.  She does not like the idea of the Botox not lasting more than 3 to 4 months.  That is certainly of the downside.  The filler would last more like a year but may not get rid of the appearance of the wrinkling because of how deep the wrinkles are.  My concern is also with her anticoagulation that she will have a lot of bruising.  Coming off anticoagulation is not an option.  She may be a really good candidate for laser treatment with the [place] laser with a halo and BBL.  I gave the patient some information to look at and encouraged her to call us in January.  We should know if we are able to get the laser and I would be happy to try that on her.

## 2020-02-20 ENCOUNTER — Other Ambulatory Visit: Payer: Self-pay | Admitting: Physician Assistant

## 2020-02-20 DIAGNOSIS — E039 Hypothyroidism, unspecified: Secondary | ICD-10-CM

## 2020-02-21 ENCOUNTER — Encounter: Payer: Self-pay | Admitting: Orthopedic Surgery

## 2020-02-21 ENCOUNTER — Ambulatory Visit (INDEPENDENT_AMBULATORY_CARE_PROVIDER_SITE_OTHER): Payer: Medicare Other | Admitting: Orthopedic Surgery

## 2020-02-21 ENCOUNTER — Ambulatory Visit: Payer: Self-pay

## 2020-02-21 DIAGNOSIS — M25561 Pain in right knee: Secondary | ICD-10-CM

## 2020-02-21 DIAGNOSIS — I6523 Occlusion and stenosis of bilateral carotid arteries: Secondary | ICD-10-CM

## 2020-02-21 NOTE — Progress Notes (Signed)
Office Visit Note   Patient: Joy Williams           Date of Birth: 10-Mar-1951           MRN: [id number] Visit Date: 02/21/2020 Requested by: Lorrene Reid, PA-C [address] [place],  [place] [address] PCP: Lorrene Reid, PA-C  Subjective: Chief Complaint  Patient presents with  . Right Knee - Pain    HPI: [name] is a 69 year old patient who fell on her right knee about 4 days ago.  Been having pain in the knee since that time.  Difficult for her to go up and down stairs.  Has an abrasion just below the patella.  Has been taking ibuprofen.  Does not do too much working out but does use a brace.  Denies any instability.              ROS: All systems reviewed are negative as they relate to the chief complaint within the history of present illness.  Patient denies  fevers or chills.   Assessment & Plan: Visit Diagnoses:  1. Right knee pain, unspecified chronicity     Plan: Impression is right knee contusion possible synovitis without evidence of structural damage on either exam or radiographs.  I would favor icing for 3 times a day for the next 3 days.  Range of motion exercises.  Come back in 10 days for decision for or against injection at that time.  I think in general this most likely represents bone bruising and synovitis as opposed to any structural problem.  If she is not 50% better in 10 days I would go with injection.  If she is she can skip the appointment.  Follow-Up Instructions: Return in about 10 days (around 03/02/2020).   Orders:  Orders Placed This Encounter  Procedures  . XR KNEE 3 VIEW RIGHT   No orders of the defined types were placed in this encounter.     Procedures: No procedures performed   Clinical Data: No additional findings.  Objective: Vital Signs: There were no vitals taken for this visit.  Physical Exam:   Constitutional: Patient appears well-developed HEENT:  Head: Normocephalic Eyes:EOM are normal Neck: Normal  range of motion Cardiovascular: Normal rate Pulmonary/chest: Effort normal Neurologic: Patient is alert Skin: Skin is warm Psychiatric: Patient has normal mood and affect    Ortho Exam: Ortho exam demonstrates pretty normal gait alignment.  She is able to squat.  Extensor mechanism intact.  Dime sized contusion/abrasion just below the lateral aspect of the inferior pole of patella.  No effusion.  Extensor mechanism is intact.  No groin pain with internal X rotation of the leg.  Ankle dorsiflexion intact.  Range of motion otherwise full in both knees.  Negative patellar apprehension although she does have some periretinacular tenderness.  Specialty Comments:  No specialty comments available.  Imaging: XR KNEE 3 VIEW RIGHT  Result Date: 02/21/2020 AP lateral merchant right knee reviewed.  Some calcification within the meniscus is present.  No acute fracture.  Bones osteopenic.  Alignment intact.  Patella height normal relative to the distal femur    PMFS History: Patient Active Problem List   Diagnosis Date Noted  . Encounter for counseling 02/08/2020  . Suppression of immune system - subtherapeutic ([place]) 07/14/2019  . History of TIA (transient ischemic attack) 05/10/2019  . Non-ST elevated myocardial infarction ([place]) 02/25/2019  . NSTEMI (non-ST elevated myocardial infarction) ([place]) 02/25/2019  . Elevated troponin level not due to  acute coronary syndrome   . 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 On statin therapy 04/27/2018  . Memory loss or impairment 04/27/2018  . Complicated migraine 11/10/7626  . Hyperlipidemia LDL goal <70 03/12/2018  . 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 Stroke-like episode ([place]) s/p tPA 03/11/2018  . Dysuria 10/14/2017  . Abnormal finding on urinalysis 10/14/2017  . Pharyngitis 10/14/2017  . Closed nondisplaced fracture of base of fifth metacarpal bone of right hand 05/27/2017  . Decreased range of motion  of right ankle 05/27/2017  . Pain and swelling of right wrist 05/27/2017  . Age-related osteoporosis without current pathological fracture 02/20/2017  . ASCUS of cervix with negative high risk HPV 01/29/2017  . 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 Tobacco use disorder 01/22/2017  . Tobacco abuse counseling 01/22/2017  . Neoplastic malignant related fatigue 01/22/2017  . Vitamin D insufficiency 01/22/2017  . Estrogen deficiency 01/14/2017  . H/O cardiac catheterization- in prep for lung ca sx 01/06/2017  . Hypothyroidism 01/06/2017  . Previous back surgeries 01/06/2017  . 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 Chronic diastolic congestive heart failure ([place])   . Chronic obstructive pulmonary disease ([place])   . 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 Antineoplastic chemotherapy induced anemia 05/05/2015  . History of epistaxis 03/30/2015  . Raynaud's disease 01/19/2015  . Malignant neoplasm of left lung ([place]) 12/30/2014  . Lung cancer ([place]) 12/30/2014  . Malignant neoplasm of overlapping sites of left lung ([place]) 12/06/2014  . Nonintractable episodic headache 12/06/2014  . Severe mitral regurgitation 11/15/2014  . 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 Asthmatic bronchitis , chronic ([place]) 10/03/2014  . Multiple pulmonary nodules 10/03/2014  . Current Cigarette smoker 10/03/2014   Past Medical History:  Diagnosis Date  . Allergy   . Asthma    pt denies this, but is on Dulera  . Chronic diastolic congestive heart failure ([place])   . 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 COPD (chronic obstructive pulmonary disease) ([place])   . Family history of adverse reaction to anesthesia    Mother- very sensitive to medication  . Heart murmur    MVP  . Hypothyroidism   .  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 MVP (mitral valve prolapse)   . PONV (postoperative nausea and vomiting)   . Raynaud's disease    Raynaud's  . Raynaud's syndrome   . 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 Severe mitral regurgitation 11/15/2014  . Shortness of breath dyspnea    with exertion  . STD (sexually transmitted disease)   . Telangiectasia   . Thyroid disease     Family History  Problem Relation Age of Onset  . Mitral valve prolapse Mother   . Dementia Father   . Prostate cancer Father   . Mitral valve prolapse Brother   . Mitral valve prolapse Sister   . Colon cancer Neg Hx   . Colon polyps Neg Hx   . Rectal cancer  Neg Hx   . Esophageal cancer Neg Hx   . Stomach cancer Neg Hx   . Heart attack Neg Hx     Past Surgical History:  Procedure Laterality Date  . BACK SURGERY     x 3  Disectomy  . BREAST BIOPSY Left   . CARDIAC CATHETERIZATION N/A 03/08/2016   Procedure: Right/Left Heart Cath and Coronary Angiography;  Surgeon: Sherren Mocha, MD;  Location: [place];  Service: Cardiovascular;  Laterality: N/A;  . CLAVICLE SURGERY Left 2013   plate to left collar bone  . LAPAROSCOPY     ? reason-age 1   . LEFT HEART CATH AND CORONARY ANGIOGRAPHY N/A 02/25/2019   Procedure: LEFT HEART CATH AND CORONARY ANGIOGRAPHY;  Surgeon: Jettie Booze, MD;  Location: [facility];  Service: Cardiovascular;  Laterality: N/A;  . LUNG CANCER SURGERY    . MITRAL VALVE REPAIR Right 06/27/2016   Procedure: MINIMALLY INVASIVE MITRAL VALVE REPLACEMENT;  Surgeon: Rexene Alberts, MD;  Location: [facility];  Service: Open Heart Surgery;  Laterality: Right;  . TEE WITHOUT CARDIOVERSION  N/A 02/22/2016   Procedure: TRANSESOPHAGEAL ECHOCARDIOGRAM (TEE);  Surgeon: Dorothy Spark, MD;  Location: [facility];  Service: Cardiovascular;  Laterality: N/A;  . TEE WITHOUT CARDIOVERSION N/A 06/27/2016   Procedure: TRANSESOPHAGEAL ECHOCARDIOGRAM (TEE);  Surgeon: Rexene Alberts, MD;  Location: [place];  Service: Open Heart Surgery;  Laterality: N/A;  . TONSILLECTOMY    . VIDEO ASSISTED THORACOSCOPY (VATS)/WEDGE RESECTION Left 12/30/2014   Bronchoscopy, Mediastinoscopy, Left VATS for Wedge resection LUL x2 adn LLL x1 - Dr. Elenor Quinones at [facility]  . VOCAL CORD INJECTION Left 2017   injected with botox  . wrist surgery Left 2015   plate to wrist    Social History   Occupational History  . Occupation: Retired  Tobacco Use  . Smoking status: Former Smoker    Packs/day: 0.25    Years: 24.00    Pack years: 6.00    Types: E-cigarettes, Cigarettes    Start date: 05/21/1983    Quit date: 08/26/2019    Years since quitting: 0.4  . Smokeless tobacco: Never Used  . Tobacco comment: Patient is interested in trying nicotene gum  Vaping Use  . Vaping Use: Former  . Quit date: 05/20/2006  Substance and Sexual Activity  . Alcohol use: Yes    Alcohol/week: 2.0 standard drinks    Types: 1 Shots of liquor, 1 Standard drinks or equivalent per week  . Drug use: No  . Sexual activity: Never    Partners: Male    Birth control/protection: Post-menopausal

## 2020-02-25 ENCOUNTER — Telehealth: Payer: Self-pay

## 2020-02-25 NOTE — Telephone Encounter (Signed)
Tried calling. We do not keep cancellation list but patient can call back and check with [place].

## 2020-02-25 NOTE — Telephone Encounter (Signed)
Patient called in saying she is having major pain in knee wanted to know if she can be on cancellation list so she can be seen before her next apt on 10/14. If not she will keep original appt

## 2020-03-02 ENCOUNTER — Ambulatory Visit: Payer: Medicare Other | Admitting: Orthopedic Surgery

## 2020-04-19 ENCOUNTER — Telehealth (INDEPENDENT_AMBULATORY_CARE_PROVIDER_SITE_OTHER): Payer: Medicare Other | Admitting: Medical-Surgical

## 2020-04-19 ENCOUNTER — Encounter: Payer: Self-pay | Admitting: Medical-Surgical

## 2020-04-19 VITALS — BP 142/73 | HR 68 | Temp 97.3°F | Ht 66.5 in | Wt 147.0 lb

## 2020-04-19 DIAGNOSIS — H9203 Otalgia, bilateral: Secondary | ICD-10-CM | POA: Diagnosis not present

## 2020-04-19 MED ORDER — DOXYCYCLINE HYCLATE 100 MG PO TABS: 100.0000 mg | ORAL_TABLET | Freq: Two times a day (BID) | ORAL | 0 refills | Status: AC

## 2020-04-19 NOTE — Progress Notes (Signed)
Virtual Visit via Video Note  I connected with [place] on 04/19/20 at  8:10 AM EST by a video enabled telemedicine application and verified that I am speaking with the correct person using two identifiers.   I discussed the limitations of evaluation and management by telemedicine and the availability of in person appointments. The patient expressed understanding and agreed to proceed.  Patient location: home Provider locations: office  Subjective:    CC: Ear pain  HPI: Pleasant 69 year old female presenting via MyChart video visit with complaints of bilateral otalgia x2 weeks.  Notes that her pain started out mild but has been getting worse.  Pain is rated 7-8/10, constant.  Notes that it is painful to push on the tragus but also just behind the ear lobes.  Wears hearing aids bilaterally, has not noticed any new hearing loss.  She does have intermittent tinnitus that is longstanding but no worsening of her symptoms.  Denies fever, chills, rhinorrhea, postnasal drip, sore throat, headache, dizziness, cough, shortness of breath, chest pain, and sneezing.  She has tried over-the-counter medications including Vicks cold and flu and eardrops with no relief.  Is prescribed Astelin as well as Flonase but admits that she is not regular with taking these.  Past medical history, Surgical history, Family history not pertinant except as noted below, Social history, Allergies, and medications have been entered into the medical record, reviewed, and corrections made.   Review of Systems: See HPI for pertinent positives and negatives.   Objective:    General: Speaking clearly in complete sentences without any shortness of breath.  Alert and oriented x3.  Normal judgment. No apparent acute distress.  Impression and Recommendations:    1. Acute pain of both ears With 2 weeks of symptoms that seem to be progressing, we will go ahead and treat empirically for otitis media with doxycycline twice  daily for 7 days.  Advised patient that use of Flonase as well as Astelin may help with her symptoms if there is any eustachian tube dysfunction contributing to her pain.  Recommend starting a daily antihistamine such as Zyrtec, Claritin, etc.  Okay to use Tylenol as needed for pain.  If symptoms have not resolved in 7 to 10 days, advised patient to let me know.  At that point, she will likely need an in person evaluation to facilitate a physical exam.  I discussed the assessment and treatment plan with the patient. The patient was provided an opportunity to ask questions and all were answered. The patient agreed with the plan and demonstrated an understanding of the instructions.   The patient was advised to call back or seek an in-person evaluation if the symptoms worsen or if the condition fails to improve as anticipated.  20 minutes of non-face-to-face time was provided during this encounter.  Return if symptoms worsen or fail to improve.  Clearnce Sorrel, DNP, APRN, FNP-BC [facility]

## 2020-04-27 ENCOUNTER — Ambulatory Visit: Payer: Medicare Other | Admitting: Physician Assistant

## 2020-05-21 ENCOUNTER — Other Ambulatory Visit: Payer: Self-pay | Admitting: Cardiology

## 2020-05-22 ENCOUNTER — Telehealth: Payer: Self-pay | Admitting: Physician Assistant

## 2020-05-22 ENCOUNTER — Other Ambulatory Visit: Payer: Self-pay | Admitting: Physician Assistant

## 2020-05-22 DIAGNOSIS — E039 Hypothyroidism, unspecified: Secondary | ICD-10-CM

## 2020-05-22 NOTE — Telephone Encounter (Signed)
Please call patient to schedule apt per last AVS for med refills. AS, CMA

## 2020-05-23 NOTE — Telephone Encounter (Signed)
Left voicemail on 05-23-20.

## 2020-05-24 ENCOUNTER — Telehealth: Payer: Self-pay | Admitting: Physician Assistant

## 2020-05-24 ENCOUNTER — Other Ambulatory Visit: Payer: Self-pay | Admitting: Medical-Surgical

## 2020-05-24 NOTE — Telephone Encounter (Signed)
Called patient to advise unable to be seen [place]. Patient was instructed to go to UC for eval and treatment. Patient verbalized understanding. AS, CMA

## 2020-05-24 NOTE — Telephone Encounter (Signed)
Patient calling requesting refill of doxycycline that she was given for otitis media by Samuel Bouche, NP last month. I advised patient that we could not give refill of this medication and that Joy suggested in her note that patient be seen in person for evaluation. Patient requesting apt in our office but we do not have any open apt this week.   Patient requesting virtual with Caryl Asp- we have contact [facility] for apt with Joy, awaiting them to reach back out to [place] and we will contact patient to schedule or advise below:  I advised patient if we are unable to schedule her apt then she should go to urgent care for evaluation and treatment. Patient was very upset with this advice.

## 2020-05-24 NOTE — Telephone Encounter (Signed)
I got in touch with Jule Ser (Sister Office) and spoke with front office about possibly getting patient in virtually with Joy. Patient was seen in December for an "Acute" appointment and recommended patient following up with PCP and she is scheduled on 1-27. I advised Patient to go to Urgent Care as our nurse Athena did, if she cannot wait until her appointment or her next available. Patient was upset when I spoke with her earlier about not being able to get in much sooner.

## 2020-05-25 ENCOUNTER — Ambulatory Visit
Admission: RE | Admit: 2020-05-25 | Discharge: 2020-05-25 | Disposition: A | Discharge status: Home / Self Care | Payer: Medicare Other | Source: Ambulatory Visit | Attending: Internal Medicine | Admitting: Internal Medicine

## 2020-05-25 ENCOUNTER — Other Ambulatory Visit: Payer: Self-pay

## 2020-05-25 VITALS — BP 116/60 | HR 67 | Temp 97.8°F | Resp 18

## 2020-05-25 DIAGNOSIS — Z20822 Contact with and (suspected) exposure to covid-19: Secondary | ICD-10-CM

## 2020-05-25 MED ORDER — GUAIFENESIN ER 600 MG PO TB12
600.0000 mg | ORAL_TABLET | Freq: Two times a day (BID) | ORAL | 0 refills | Status: AC
Start: 2020-05-25 — End: 2020-06-08

## 2020-05-25 NOTE — ED Triage Notes (Signed)
Pt c/o bilateral ear pain/pressure, headache, and nasal congestion for over a week. States has had positive covid exposure. States treated for bilateral ear infection in 12/21.

## 2020-05-25 NOTE — ED Provider Notes (Signed)
[facility]    CSN: [id number] Arrival date & time: 05/25/20  1142      History   Chief Complaint Chief Complaint  Patient presents with  . appt 12  . Nasal Congestion    HPI Joy Williams is a 70 y.o. female comes to urgent care with complaints of nasal congestion and a feeling of fullness in the ear as well as headache of 1 week duration.  Patient denies any fever or chills.  No nausea vomiting.  No diarrhea.  She is fully vaccinated against COVID-19 virus and has received a booster vaccine as well.  She was exposed to a Covid 19+ individual about a week ago.  Appetite is preserved.  No loss of taste or smell.  No back pain.   HPI  Past Medical History:  Diagnosis Date  . Allergy   . Asthma    pt denies this, but is on Dulera  . Chronic diastolic congestive heart failure ([place])   . 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 COPD (chronic obstructive pulmonary disease) ([place])   . Family history of adverse reaction to anesthesia    Mother- very sensitive to medication  . Heart murmur    MVP  . Hypothyroidism   . 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 MVP (mitral valve prolapse)   . PONV (postoperative nausea and vomiting)   . Raynaud's disease    Raynaud's  . Raynaud's syndrome   . 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 Severe mitral regurgitation 11/15/2014  . Shortness of breath dyspnea    with exertion  . STD (sexually transmitted disease)   . Telangiectasia   . Thyroid disease     Patient Active Problem List   Diagnosis Date Noted  . Encounter for counseling  02/08/2020  . Suppression of immune system - subtherapeutic ([place]) 07/14/2019  . History of TIA (transient ischemic attack) 05/10/2019  . Non-ST elevated myocardial infarction ([place]) 02/25/2019  . NSTEMI (non-ST elevated myocardial infarction) ([place]) 02/25/2019  . Elevated troponin level not due to acute coronary syndrome   . 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 On statin therapy 04/27/2018  . Memory loss or impairment 04/27/2018  . Complicated migraine 40/98/1191  . Hyperlipidemia LDL goal <70 03/12/2018  . 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 Stroke-like episode ([place]) s/p tPA 03/11/2018  . Dysuria 10/14/2017  . Abnormal finding on urinalysis 10/14/2017  . Pharyngitis 10/14/2017  . Closed nondisplaced fracture of base of fifth metacarpal bone of right hand 05/27/2017  . Decreased range of motion of right ankle 05/27/2017  . Pain and swelling of right wrist 05/27/2017  . Age-related osteoporosis without current pathological fracture 02/20/2017  . ASCUS of cervix with negative high risk HPV 01/29/2017  . 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 Tobacco use disorder 01/22/2017  . Tobacco abuse counseling 01/22/2017  . Neoplastic malignant related fatigue 01/22/2017  . Vitamin D insufficiency 01/22/2017  . Estrogen deficiency 01/14/2017  . H/O cardiac  catheterization- in prep for lung ca sx 01/06/2017  . Hypothyroidism 01/06/2017  . Previous back surgeries 01/06/2017  . 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 Chronic diastolic congestive heart failure ([place])   . Chronic obstructive pulmonary disease ([place])   . 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 Antineoplastic chemotherapy induced anemia 05/05/2015  . History of epistaxis 03/30/2015  . Raynaud's disease 01/19/2015  . Malignant neoplasm of left lung ([place]) 12/30/2014  .  Lung cancer ([place]) 12/30/2014  . Malignant neoplasm of overlapping sites of left lung ([place]) 12/06/2014  . Nonintractable episodic headache 12/06/2014  . Severe mitral regurgitation 11/15/2014  . Scleroderma ([place]) 10/03/2014  . Asthmatic bronchitis , chronic ([place]) 10/03/2014  . Multiple pulmonary nodules 10/03/2014  . Current Cigarette smoker 10/03/2014    Past Surgical History:  Procedure Laterality Date  . BACK SURGERY     x 3  Disectomy  . BREAST BIOPSY Left   . CARDIAC CATHETERIZATION N/A 03/08/2016   Procedure: Right/Left Heart Cath and Coronary Angiography;  Surgeon: Sherren Mocha, MD;  Location: [facility];  Service: Cardiovascular;  Laterality: N/A;  . CLAVICLE SURGERY Left 2013   plate to left collar bone  . LAPAROSCOPY     ? reason-age 26   . LEFT HEART CATH AND CORONARY ANGIOGRAPHY N/A 02/25/2019   Procedure: LEFT HEART CATH AND CORONARY ANGIOGRAPHY;  Surgeon: Jettie Booze, MD;  Location: [facility];  Service: Cardiovascular;  Laterality: N/A;  . LUNG CANCER SURGERY    . MITRAL VALVE REPAIR Right 06/27/2016   Procedure: MINIMALLY INVASIVE MITRAL VALVE REPLACEMENT;  Surgeon: Rexene Alberts, MD;  Location: [facility];  Service: Open Heart Surgery;  Laterality: Right;  . TEE WITHOUT CARDIOVERSION N/A 02/22/2016   Procedure: TRANSESOPHAGEAL ECHOCARDIOGRAM (TEE);  Surgeon: Dorothy Spark, MD;  Location: [facility];  Service: Cardiovascular;  Laterality: N/A;  . TEE WITHOUT CARDIOVERSION N/A 06/27/2016   Procedure: TRANSESOPHAGEAL ECHOCARDIOGRAM (TEE);  Surgeon: Rexene Alberts, MD;  Location: [facility];  Service: Open Heart Surgery;  Laterality: N/A;  . TONSILLECTOMY    . VIDEO ASSISTED THORACOSCOPY (VATS)/WEDGE RESECTION Left 12/30/2014   Bronchoscopy, Mediastinoscopy, Left VATS for Wedge resection LUL x2 adn LLL x1 - Dr. Elenor Quinones at [facility]  . VOCAL CORD INJECTION Left 2017   injected with botox  . wrist surgery Left 2015   plate to wrist     OB History     Gravida  3   Para  2   Term  2   Preterm      AB  1   Living  2     SAB      IAB      Ectopic      Multiple      Live Births  2            Home Medications    Prior to Admission medications   Medication Sig Start Date End Date Taking? Authorizing Provider  guaiFENesin (MUCINEX) 600 MG 12 hr tablet Take 1 tablet (600 mg total) by mouth 2 (two) times daily for 14 days. 05/25/20 06/08/20 Yes Achol Azpeitia, Myrene Galas, MD  acetaminophen (TYLENOL) 500 MG tablet Take 1,000 mg by mouth every 6 (six) hours as needed for mild pain or headache.    [provider]  aspirin EC 81 MG tablet Take 1 tablet (81 mg total) by mouth daily. 03/24/19   Dorothy Spark, MD  atorvastatin (LIPITOR) 20  MG tablet TAKE 1 TABLET (20 MG TOTAL) BY MOUTH DAILY AT 6 PM. 11/10/19   Dorothy Spark, MD  azelastine (ASTELIN) 0.1 % nasal spray Place 2 sprays into both nostrils 2 (two) times daily. Use in each nostril as directed 12/27/19   Lorrene Reid, PA-C  Cholecalciferol (VITAMIN D-3) 125 MCG (5000 UT) TABS Take 5,000 Units by mouth daily.    [provider]  clopidogrel (PLAVIX) 75 MG tablet Take 1 tablet (75 mg total) by mouth daily. Please make yearly appt with Dr. Meda Coffee for February 2022 for future refills. Thank you 1st attempt 05/23/20   Dorothy Spark, MD  levothyroxine (SYNTHROID) 175 MCG tablet TAKE 1 TABLET BY MOUTH DAILY BEFORE BREAKFAST**NEEDS APT FOR REFILLS** 05/22/20   Lorrene Reid, PA-C  metoprolol tartrate (LOPRESSOR) 25 MG tablet TAKE 1 TABLET BY MOUTH TWICE A DAY 11/08/19   Dorothy Spark, MD  Multiple Vitamins-Minerals (MULTIVITAMIN WITH MINERALS) tablet Take 1 tablet by mouth daily.    [provider]  nitroGLYCERIN (NITROSTAT) 0.4 MG SL tablet Place 1 tablet (0.4 mg total) under the tongue every 5 (five) minutes as needed for chest pain. 03/01/19   Weaver, Scott T, PA-C  TRELEGY ELLIPTA 100-62.5-25 MCG/INH AEPB TAKE 1 PUFF BY MOUTH EVERY DAY Patient  taking differently: Inhale 2 puffs into the lungs daily.  09/13/19   Tanda Rockers, MD  Turmeric 500 MG CAPS Take 500 mg by mouth daily.    [provider]  Vitamin D, Ergocalciferol, (DRISDOL) 1.25 MG (50000 UT) CAPS capsule Take 1 capsule (50,000 Units total) by mouth every 7 (seven) days. 04/21/19   Mellody Dance, DO    Family History Family History  Problem Relation Age of Onset  . Mitral valve prolapse Mother   . Dementia Father   . Prostate cancer Father   . Mitral valve prolapse Brother   . Mitral valve prolapse Sister   . Colon cancer Neg Hx   . Colon polyps Neg Hx   . Rectal cancer Neg Hx   . Esophageal cancer Neg Hx   . Stomach cancer Neg Hx   . Heart attack Neg Hx     Social History Social History   Tobacco Use  . Smoking status: Former Smoker    Packs/day: 0.25    Years: 24.00    Pack years: 6.00    Types: E-cigarettes, Cigarettes    Start date: 05/21/1983    Quit date: 08/26/2019    Years since quitting: 0.7  . Smokeless tobacco: Never Used  . Tobacco comment: Patient is interested in trying nicotene gum  Vaping Use  . Vaping Use: Former  . Quit date: 05/20/2006  Substance Use Topics  . Alcohol use: Yes    Alcohol/week: 2.0 standard drinks    Types: 1 Shots of liquor, 1 Standard drinks or equivalent per week  . Drug use: No     Allergies   Penicillins   Review of Systems Review of Systems  HENT: Positive for congestion. Negative for postnasal drip and sore throat.   Respiratory: Negative for cough, shortness of breath and wheezing.   Cardiovascular: Negative for chest pain.  Gastrointestinal: Negative.  Negative for nausea and vomiting.  Genitourinary: Negative.   Musculoskeletal: Positive for myalgias.  Neurological: Positive for headaches. Negative for dizziness and light-headedness.     Physical Exam Triage Vital Signs ED Triage Vitals  Enc Vitals Group     BP 05/25/20 1230 116/60     Pulse Rate 05/25/20  1230 67     Resp  05/25/20 1230 18     Temp 05/25/20 1230 97.8 F (36.6 C)     Temp Source 05/25/20 1230 Oral     SpO2 05/25/20 1230 95 %     Weight --      Height --      Head Circumference --      Peak Flow --      Pain Score 05/25/20 1231 8     Pain Loc --      Pain Edu? --      Excl. in [place]? --    No data found.  Updated Vital Signs BP 116/60 (BP Location: Left Arm)   Pulse 67   Temp 97.8 F (36.6 C) (Oral)   Resp 18   SpO2 95%   Visual Acuity Right Eye Distance:   Left Eye Distance:   Bilateral Distance:    Right Eye Near:   Left Eye Near:    Bilateral Near:     Physical Exam Vitals reviewed.  Constitutional:      General: She is not in acute distress.    Appearance: She is not ill-appearing.  HENT:     Right Ear: Tympanic membrane normal.     Left Ear: Tympanic membrane normal.     Ears:     Comments: Left middle ear effusion Cardiovascular:     Rate and Rhythm: Normal rate and regular rhythm.     Pulses: Normal pulses.     Heart sounds: Normal heart sounds.  Neurological:     Mental Status: She is alert.      UC Treatments / Results  Labs (all labs ordered are listed, but only abnormal results are displayed) Labs Reviewed  NOVEL CORONAVIRUS, NAA    EKG   Radiology No results found.  Procedures Procedures (including critical care time)  Medications Ordered in UC Medications - No data to display  Initial Impression / Assessment and Plan / UC Course  I have reviewed the triage vital signs and the nursing notes.  Pertinent labs & imaging results that were available during my care of the patient were reviewed by me and considered in my medical decision making (see chart for details).     1.  Nasal congestion in the setting of COVID-19 exposure: Patient already has Flonase at home She is encouraged to use Flonase and Mucinex Tylenol as needed for pain and/or fever We will call patient with recommendations if labs are normal. Final Clinical  Impressions(s) / UC Diagnoses   Final diagnoses:  Encounter for screening laboratory testing for COVID-19 virus     Discharge Instructions     Increase fluid intake If your symptoms worsen please return to the urgent care Take medications as directed Please quarantine until COVID-19 test results are available We will call you with recommendations if your lab results are abnormal.   ED Prescriptions    Medication Sig Dispense Auth. Provider   guaiFENesin (MUCINEX) 600 MG 12 hr tablet Take 1 tablet (600 mg total) by mouth 2 (two) times daily for 14 days. 28 tablet Anne [place], Myrene Galas, MD     PDMP not reviewed this encounter.   Chase Picket, MD 05/25/20 1321

## 2020-05-25 NOTE — Discharge Instructions (Addendum)
Increase fluid intake If your symptoms worsen please return to the urgent care Take medications as directed Please quarantine until COVID-19 test results are available We will call you with recommendations if your lab results are abnormal.

## 2020-05-27 LAB — NOVEL CORONAVIRUS, NAA: SARS-CoV-2, NAA: NOT DETECTED

## 2020-05-27 LAB — SARS-COV-2, NAA 2 DAY TAT

## 2020-06-15 ENCOUNTER — Encounter: Payer: Self-pay | Admitting: Physician Assistant

## 2020-06-15 ENCOUNTER — Other Ambulatory Visit: Payer: Self-pay

## 2020-06-15 ENCOUNTER — Ambulatory Visit (INDEPENDENT_AMBULATORY_CARE_PROVIDER_SITE_OTHER): Payer: Medicare Other | Admitting: Physician Assistant

## 2020-06-15 VITALS — BP 100/62 | HR 68 | Ht 66.5 in | Wt 147.8 lb

## 2020-06-15 DIAGNOSIS — Z1231 Encounter for screening mammogram for malignant neoplasm of breast: Secondary | ICD-10-CM | POA: Diagnosis not present

## 2020-06-15 DIAGNOSIS — E039 Hypothyroidism, unspecified: Secondary | ICD-10-CM

## 2020-06-15 DIAGNOSIS — Z0000 Encounter for general adult medical examination without abnormal findings: Secondary | ICD-10-CM

## 2020-06-15 DIAGNOSIS — M79671 Pain in right foot: Secondary | ICD-10-CM

## 2020-06-15 DIAGNOSIS — E785 Hyperlipidemia, unspecified: Secondary | ICD-10-CM

## 2020-06-15 DIAGNOSIS — E559 Vitamin D deficiency, unspecified: Secondary | ICD-10-CM | POA: Diagnosis not present

## 2020-06-15 NOTE — Patient Instructions (Signed)
Try topical anti-inflammatory- diclofenac (Voltaren) gel     Heel Spur  A heel spur is a bony growth that forms on the bottom of the heel bone (calcaneus). Heel spurs are common. They often cause inflammation in the plantar fascia, which is the band of tissue that connects the toe bones to the heel bone. When the plantar fascia is inflamed, it is called plantar fasciitis. This may cause pain on the bottom of the foot, near the heel. Many people with plantar fasciitis also have heel spurs. However, spurs are not the cause of plantar fasciitis pain. What are the causes? The exact cause of heel spurs is not known. They may be caused by:  Pressure on the heel bone.  Bands of tissues that connect muscle to bone (tendons) pulling on the heel bone. What increases the risk? You are more likely to develop this condition if you:  Are older than age 45.  Are overweight.  Have wear-and-tear arthritis (osteoarthritis).  Have plantar fascia inflammation.  Participate in sports or activities that include a lot of running or jumping.  Wear poorly fitted shoes. What are the signs or symptoms? Some people have no symptoms. If you do have symptoms, they may include:  Pain in the bottom of your heel.  Pain that is worse when you first get out of bed.  Pain that gets worse after walking or standing. How is this diagnosed? This condition may be diagnosed based on:  Your symptoms and medical history.  A physical exam.  A foot X-ray. How is this treated? Treatment for this condition depends on how much pain you have. Treatment options may include:  Doing stretching exercises and losing weight, if necessary.  Wearing specific shoes or inserts inside of shoes (orthotics) for comfort and support.  Wearing splints on your feet while you sleep. Splints keep your feet in a position (usually a 90-degree angle) that should prevent and relieve the pain you feel when you first get out of bed. They  also make stretching easier in the morning.  Taking over-the-counter medicine to relieve pain, such as NSAIDs.  Using high-intensity sound waves to break up the heel spur (extracorporeal shock wave therapy).  Getting steroid injections in your heel to reduce inflammation.  Having surgery, if your heel spur causes long-term (chronic) pain. Follow these instructions at home: Activity  Avoid activities that cause pain until you recover, or for as long as told by your health care provider.  Do stretching exercises as told. Stretch before exercising or being physically active. Managing pain, stiffness, and swelling  If directed, put ice on your foot. To do this: ? Put ice in a plastic bag. ? Place a towel between your skin and the bag. ? Leave the ice on for 20 minutes, 2-3 times a day. ? Remove the ice if your skin turns bright red. This is very important. If you cannot feel pain, heat, or cold, you have a greater risk of damage to the area.  Move your toes often to reduce stiffness and swelling.  When possible, raise (elevate) your foot above the level of your heart while you are sitting or lying down. General instructions  Take over-the-counter and prescription medicines only as told by your health care provider.  Wear supportive shoes that fit well. Wear splints, inserts, or orthotics as told by your health care provider.  If recommended, work with your health care provider to lose weight. This can relieve pressure on your foot.  Do not  use any products that contain nicotine or tobacco, such as cigarettes, e-cigarettes, and chewing tobacco. These can delay bone healing. If you need help quitting, ask your health care provider.  Keep all follow-up visits. This is important.   Where to find more information  American Academy of Orthopaedic Surgeons: www.orthoinfo.aaos.org Contact a health care provider if:  Your pain does not go away with treatment.  Your pain gets  worse. Summary  A heel spur is a bony growth that forms on the bottom of the heel bone (calcaneus).  Heel spurs often cause inflammation in the plantar fascia, which is the band of tissue that connects the toes to the heel bone. This may cause pain on the bottom of the foot, near the heel.  Doing stretching exercises, losing weight, wearing specific shoes or shoe inserts, wearing splints while you sleep, and taking pain medicine may ease the pain and stiffness.  Other treatment options may include high-intensity sound waves to break up the heel spur, steroid injections, or surgery. This information is not intended to replace advice given to you by your health care provider. Make sure you discuss any questions you have with your health care provider. Document Revised: 08/31/2019 Document Reviewed: 08/31/2019 Elsevier Patient Education  2021 [employer].   Plantar Fasciitis  Plantar fasciitis is a painful foot condition that affects the heel. It occurs when the band of tissue that connects the toes to the heel bone (plantar fascia) becomes irritated. This can happen as the result of exercising too much or doing other repetitive activities (overuse injury). Plantar fasciitis can cause mild irritation to severe pain that makes it difficult to walk or move. The pain is usually worse in the morning after sleeping, or after sitting or lying down for a period of time. Pain may also be worse after long periods of walking or standing. What are the causes? This condition may be caused by:  Standing for long periods of time.  Wearing shoes that do not have good arch support.  Doing activities that put stress on joints (high-impact activities). This includes ballet and exercise that makes your heart beat faster (aerobic exercise), such as running.  Being overweight.  An abnormal way of walking (gait).  Tight muscles in the back of your lower leg (calf).  High arches in your feet or flat  feet.  Starting a new athletic activity. What are the signs or symptoms? The main symptom of this condition is heel pain. Pain may get worse after the following:  Taking the first steps after a time of rest, especially in the morning after awakening, or after you have been sitting or lying down for a while.  Long periods of standing still. Pain may decrease after 30-45 minutes of activity, such as gentle walking. How is this diagnosed? This condition may be diagnosed based on your medical history, a physical exam, and your symptoms. Your health care provider will check for:  A tender area on the bottom of your foot.  A high arch in your foot or flat feet.  Pain when you move your foot.  Difficulty moving your foot. You may have imaging tests to confirm the diagnosis, such as:  X-rays.  Ultrasound.  MRI. How is this treated? Treatment for plantar fasciitis depends on how severe your condition is. Treatment may include:  Rest, ice, pressure (compression), and raising (elevating) the affected foot. This is called RICE therapy. Your health care provider may recommend RICE therapy along with over-the-counter pain medicines  to manage your pain.  Exercises to stretch your calves and your plantar fascia.  A splint that holds your foot in a stretched, upward position while you sleep (night splint).  Physical therapy to relieve symptoms and prevent problems in the future.  Injections of steroid medicine (cortisone) to relieve pain and inflammation.  Stimulating your plantar fascia with electrical impulses (extracorporeal shock wave therapy). This is usually the last treatment option before surgery.  Surgery, if other treatments have not worked after 12 months. Follow these instructions at home: Managing pain, stiffness, and swelling  If directed, put ice on the painful area. To do this: ? Put ice in a plastic bag, or use a frozen bottle of water. ? Place a towel between your  skin and the bag or bottle. ? Roll the bottom of your foot over the bag or bottle. ? Do this for 20 minutes, 2-3 times a day.  Wear athletic shoes that have air-sole or gel-sole cushions, or try soft shoe inserts that are designed for plantar fasciitis.  Elevate your foot above the level of your heart while you are sitting or lying down.   Activity  Avoid activities that cause pain. Ask your health care provider what activities are safe for you.  Do physical therapy exercises and stretches as told by your health care provider.  Try activities and forms of exercise that are easier on your joints (low impact). Examples include swimming, water aerobics, and biking. General instructions  Take over-the-counter and prescription medicines only as told by your health care provider.  Wear a night splint while sleeping, if told by your health care provider. Loosen the splint if your toes tingle, become numb, or turn cold and blue.  Maintain a healthy weight, or work with your health care provider to lose weight as needed.  Keep all follow-up visits. This is important. Contact a health care provider if you have:  Symptoms that do not go away with home treatment.  Pain that gets worse.  Pain that affects your ability to move or do daily activities. Summary  Plantar fasciitis is a painful foot condition that affects the heel. It occurs when the band of tissue that connects the toes to the heel bone (plantar fascia) becomes irritated.  Heel pain is the main symptom of this condition. It may get worse after exercising too much or standing still for a long time.  Treatment varies, but it usually starts with rest, ice, pressure (compression), and raising (elevating) the affected foot. This is called RICE therapy. Over-the-counter medicines can also be used to manage pain. This information is not intended to replace advice given to you by your health care provider. Make sure you discuss any  questions you have with your health care provider. Document Revised: 08/23/2019 Document Reviewed: 08/23/2019 Elsevier Patient Education  2021 [employer].

## 2020-06-15 NOTE — Progress Notes (Signed)
Established Patient Office Visit  Subjective:  Patient ID: Joy Williams, female    DOB: [birth year]  Age: 70 y.o. MRN: [id number]  CC:  Chief Complaint  Patient presents with  . Hypertension  . Hyperlipidemia    HPI Joy Williams presents for follow up on hypothyroidism and vitamin D insufficiency. Has c/o right heel pain, feels like a burning sensation. Denies rash or injury or trauma.  Hypothyroidism: Patient denies fatigue, palpitations, heat/cold intolerance, weight or bowel changes. Reports medication compliance. Needs refill.  Vitamin D: Taking Vit D3 5000 units daily and weekly Vitamin D. Requesting refill of Vitamin D 50,000 units.  Past Medical History:  Diagnosis Date  . Allergy   . Asthma    pt denies this, but is on Dulera  . Chronic diastolic congestive heart failure ([place])   . 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 COPD (chronic obstructive pulmonary disease) ([place])   . Family history of adverse reaction to anesthesia    Mother- very sensitive to medication  . Heart murmur    MVP  . Hypothyroidism   . 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 MVP (mitral valve prolapse)   . PONV (postoperative nausea and vomiting)   . Raynaud's disease    Raynaud's  . Raynaud's syndrome   . 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 Severe mitral regurgitation 11/15/2014  . Shortness of breath dyspnea    with exertion  . STD (sexually transmitted disease)   . Telangiectasia   . Thyroid disease     Past Surgical History:  Procedure Laterality Date  . BACK SURGERY     x 3   Disectomy  . BREAST BIOPSY Left   . CARDIAC CATHETERIZATION N/A 03/08/2016   Procedure: Right/Left Heart Cath and Coronary Angiography;  Surgeon: Sherren Mocha, MD;  Location: [place] [facility];  Service: Cardiovascular;  Laterality: N/A;  . CLAVICLE SURGERY Left 2013   plate to left collar bone  . LAPAROSCOPY     ? reason-age 84   . LEFT HEART CATH AND CORONARY ANGIOGRAPHY N/A 02/25/2019   Procedure: LEFT HEART CATH AND CORONARY ANGIOGRAPHY;  Surgeon: Jettie Booze, MD;  Location: [place] [facility];  Service: Cardiovascular;  Laterality: N/A;  . LUNG CANCER SURGERY    . MITRAL VALVE REPAIR Right 06/27/2016   Procedure: MINIMALLY INVASIVE MITRAL VALVE REPLACEMENT;  Surgeon: Rexene Alberts, MD;  Location: [place];  Service: Open Heart Surgery;  Laterality: Right;  . TEE WITHOUT CARDIOVERSION N/A 02/22/2016   Procedure: TRANSESOPHAGEAL ECHOCARDIOGRAM (TEE);  Surgeon: Dorothy Spark, MD;  Location: [place];  Service: Cardiovascular;  Laterality: N/A;  . TEE WITHOUT CARDIOVERSION N/A 06/27/2016   Procedure: TRANSESOPHAGEAL ECHOCARDIOGRAM (TEE);  Surgeon: Rexene Alberts, MD;  Location: [place];  Service: Open Heart Surgery;  Laterality: N/A;  . TONSILLECTOMY    . VIDEO ASSISTED THORACOSCOPY (VATS)/WEDGE RESECTION Left 12/30/2014   Bronchoscopy, Mediastinoscopy, Left VATS for Wedge resection LUL x2 adn LLL x1 - Dr. Elenor Quinones at [facility]  . VOCAL CORD INJECTION Left 2017   injected with botox  .  wrist surgery Left 2015   plate to wrist     Family History  Problem Relation Age of Onset  . Mitral valve prolapse Mother   . Dementia Father   . Prostate cancer Father   . Mitral valve prolapse Brother   . Mitral valve prolapse Sister   . Colon cancer Neg Hx   . Colon polyps Neg Hx   . Rectal cancer Neg Hx   . Esophageal cancer Neg Hx   . Stomach cancer Neg Hx   . Heart attack Neg Hx     Social History   Socioeconomic History  . Marital status: Divorced    Spouse name: Not on file   . Number of children: Not on file  . Years of education: 71  . Highest education level: Not on file  Occupational History  . Occupation: Retired  Tobacco Use  . Smoking status: Former Smoker    Packs/day: 0.25    Years: 24.00    Pack years: 6.00    Types: E-cigarettes, Cigarettes    Start date: 05/21/1983    Quit date: 08/26/2019    Years since quitting: 0.8  . Smokeless tobacco: Never Used  . Tobacco comment: Patient is interested in trying nicotene gum  Vaping Use  . Vaping Use: Former  . Quit date: 05/20/2006  Substance and Sexual Activity  . Alcohol use: Yes    Alcohol/week: 2.0 standard drinks    Types: 1 Shots of liquor, 1 Standard drinks or equivalent per week  . Drug use: No  . Sexual activity: Never    Partners: Male    Birth control/protection: Post-menopausal  Other Topics Concern  . Not on file  Social History Narrative  . Not on file   Social Determinants of Health   Financial Resource Strain: Not on file  Food Insecurity: Not on file  Transportation Needs: Not on file  Physical Activity: Not on file  Stress: Not on file  Social Connections: Not on file  Intimate Partner Violence: Not on file    Outpatient Medications Prior to Visit  Medication Sig Dispense Refill  . acetaminophen (TYLENOL) 500 MG tablet Take 1,000 mg by mouth every 6 (six) hours as needed for mild pain or headache.    Marland Kitchen aspirin EC 81 MG tablet Take 1 tablet (81 mg total) by mouth daily. 90 tablet 3  . atorvastatin (LIPITOR) 20 MG tablet TAKE 1 TABLET (20 MG TOTAL) BY MOUTH DAILY AT 6 PM. 90 tablet 1  . azelastine (ASTELIN) 0.1 % nasal spray Place 2 sprays into both nostrils 2 (two) times daily. Use in each nostril as directed 30 mL 12  . Cholecalciferol (VITAMIN D-3) 125 MCG (5000 UT) TABS Take 5,000 Units by mouth daily.    . clopidogrel (PLAVIX) 75 MG tablet Take 1 tablet (75 mg total) by mouth daily. Please make yearly appt with Dr. Meda Coffee for February 2022 for future refills. Thank you  1st attempt 90 tablet 0  . metoprolol tartrate (LOPRESSOR) 25 MG tablet TAKE 1 TABLET BY MOUTH TWICE A DAY 180 tablet 2  . Multiple Vitamins-Minerals (MULTIVITAMIN WITH MINERALS) tablet Take 1 tablet by mouth daily.    . nitroGLYCERIN (NITROSTAT) 0.4 MG SL tablet Place 1 tablet (0.4 mg total) under the tongue every 5 (five) minutes as needed for chest pain. 25 tablet 3  . TRELEGY ELLIPTA 100-62.5-25 MCG/INH AEPB TAKE 1 PUFF BY MOUTH EVERY DAY (Patient taking differently: Inhale 2 puffs into the lungs daily.) 60 each 11  .  Turmeric 500 MG CAPS Take 500 mg by mouth daily.    . Vitamin D, Ergocalciferol, (DRISDOL) 1.25 MG (50000 UT) CAPS capsule Take 1 capsule (50,000 Units total) by mouth every 7 (seven) days. 12 capsule 3  . levothyroxine (SYNTHROID) 175 MCG tablet TAKE 1 TABLET BY MOUTH DAILY BEFORE BREAKFAST**NEEDS APT FOR REFILLS** 60 tablet 0   No facility-administered medications prior to visit.    Allergies  Allergen Reactions  . Penicillins Hives    Unknown, occurred as a child Has patient had a PCN reaction causing immediate rash, facial/tongue/throat swelling, SOB or lightheadedness with hypotension: Yes Has patient had a PCN reaction causing severe rash involving mucus membranes or skin necrosis: No Has patient had a PCN reaction that required hospitalization  Has patient had a PCN reaction occurring within the last 10 years: No If all of the above answers are "NO", then may proceed with Cephalosporin use. No    ROS Review of Systems A fourteen system review of systems was performed and found to be positive as per HPI.    Objective:    Physical Exam General:  Well Developed, well nourished, appropriate for stated age.  Neuro:  Alert and oriented,  extra-ocular muscles intact  HEENT:  Normocephalic, atraumatic, neck supple Skin:  no gross rash, warm, pink. Cardiac:  RRR, S1 S2, w/o murmur Respiratory:  ECTA, Not using accessory muscles, speaking in full sentences-  unlabored. Extremities: TTP of right heel with mild erythema, no puncture wound or injury present, good ROM of LE Vascular:  Ext warm, no cyanosis apprec.; cap RF less 2 sec. Psych:  No HI/SI, judgement and insight good, Euthymic mood. Full Affect.   BP 100/62   Pulse 68   Ht 5' 6.5" (1.689 m)   Wt 147 lb 12.8 oz (67 kg)   SpO2 (!) 68%   BMI 23.50 kg/m  Wt Readings from Last 3 Encounters:  06/15/20 147 lb 12.8 oz (67 kg)  04/19/20 147 lb (66.7 kg)  02/08/20 145 lb (65.8 kg)     Health Maintenance Due  Topic Date Due  . INFLUENZA VACCINE  12/19/2019    There are no preventive care reminders to display for this patient.  Lab Results  Component Value Date   TSH 0.603 06/15/2020   Lab Results  Component Value Date   WBC 7.7 02/25/2019   HGB 12.1 02/25/2019   HCT 38.2 02/25/2019   MCV 89.5 02/25/2019   PLT 209 02/25/2019   Lab Results  Component Value Date   NA 137 06/15/2020   K 4.0 06/15/2020   CO2 22 06/15/2020   GLUCOSE 78 06/15/2020   BUN 12 06/15/2020   CREATININE 0.72 06/15/2020   BILITOT 0.3 06/15/2020   ALKPHOS 72 06/15/2020   AST 30 06/15/2020   ALT 20 06/15/2020   PROT 6.9 06/15/2020   ALBUMIN 4.3 06/15/2020   CALCIUM 9.4 06/15/2020   ANIONGAP 12 03/17/2019   GFR 86.76 10/28/2014   Lab Results  Component Value Date   CHOL 133 02/25/2019   Lab Results  Component Value Date   HDL 61 02/25/2019   Lab Results  Component Value Date   LDLCALC 57 02/25/2019   Lab Results  Component Value Date   TRIG 74 02/25/2019   Lab Results  Component Value Date   CHOLHDL 2.2 02/25/2019   Lab Results  Component Value Date   HGBA1C 5.3 03/12/2018      Assessment & Plan:   Problem List Items Addressed This Visit  Endocrine   Hypothyroidism - Primary (Chronic)   Relevant Orders   TSH (Completed)     Other   Vitamin D insufficiency (Chronic)   Relevant Orders   Vitamin D (25 hydroxy) (Completed)   Hyperlipidemia LDL goal <70   Relevant  Orders   Comp Met (CMET) (Completed)   Direct LDL (Completed)    Other Visit Diagnoses    Healthcare maintenance       Relevant Orders   Comp Met (CMET) (Completed)   TSH (Completed)   Vitamin D (25 hydroxy) (Completed)   Direct LDL (Completed)   Pain of right heel       Relevant Orders   Uric acid (Completed)   Encounter for screening mammogram for malignant neoplasm of breast       Relevant Orders   MM Digital Screening     Healthcare Maintenance: -Will place order for screening mammogram. -Patient non-fasting, will lab orders for CMP, direct LDL. -Continue to follow up with various specialists including cardiology and pulmonology. -Follow up in 6 months for [place].  Hypothyroidism: -Last TSH wnl -Rechecking thyroid labs today. Pending results will make medication adjustments if indicated or send additional refills.  Vitamin D insufficiency: -Last Vit D 40.4, wnl -Will repeat Vit D today. Pending results will send additional refills of Vit D 50000 units or adjust treatment plan.  Pain of right heel: -Discussed with patient potential etiologies including plantar fasciitis and heel spur. Will place lab order for uric acid to r/o gout. -Recommend to wear good support shoes, gentle stretches and exercises, or apply topical anti-inflammatory such as diclofenac gel.  -If symptoms fail to improve or worsen recommend further evaluation with imaging studies (foot x-ray).   No orders of the defined types were placed in this encounter.   Follow-up: Return in about 6 months (around 12/13/2020) for [place].    Lorrene Reid, PA-C

## 2020-06-16 ENCOUNTER — Other Ambulatory Visit: Payer: Self-pay | Admitting: Physician Assistant

## 2020-06-16 DIAGNOSIS — E039 Hypothyroidism, unspecified: Secondary | ICD-10-CM

## 2020-06-16 LAB — COMPREHENSIVE METABOLIC PANEL
ALT: 20 IU/L (ref 0–32)
AST: 30 IU/L (ref 0–40)
Albumin/Globulin Ratio: 1.7 (ref 1.2–2.2)
Albumin: 4.3 g/dL (ref 3.8–4.8)
Alkaline Phosphatase: 72 IU/L (ref 44–121)
BUN/Creatinine Ratio: 17 (ref 12–28)
BUN: 12 mg/dL (ref 8–27)
Bilirubin Total: 0.3 mg/dL (ref 0.0–1.2)
CO2: 22 mmol/L (ref 20–29)
Calcium: 9.4 mg/dL (ref 8.7–10.3)
Chloride: 98 mmol/L (ref 96–106)
Creatinine, Ser: 0.72 mg/dL (ref 0.57–1.00)
GFR calc Af Amer: 99 mL/min/{1.73_m2} (ref >59)
GFR calc non Af Amer: 86 mL/min/{1.73_m2} (ref >59)
Globulin, Total: 2.6 g/dL (ref 1.5–4.5)
Glucose: 78 mg/dL (ref 65–99)
Potassium: 4 mmol/L (ref 3.5–5.2)
Sodium: 137 mmol/L (ref 134–144)
Total Protein: 6.9 g/dL (ref 6.0–8.5)

## 2020-06-16 LAB — LDL CHOLESTEROL, DIRECT: LDL Direct: 63 mg/dL (ref 0–99)

## 2020-06-16 LAB — URIC ACID: Uric Acid: 3.6 mg/dL (ref 3.0–7.2)

## 2020-06-16 LAB — VITAMIN D 25 HYDROXY (VIT D DEFICIENCY, FRACTURES): Vit D, 25-Hydroxy: 57.9 ng/mL (ref 30.0–100.0)

## 2020-06-16 LAB — TSH: TSH: 0.603 u[IU]/mL (ref 0.450–4.500)

## 2020-06-16 MED ORDER — LEVOTHYROXINE SODIUM 175 MCG PO TABS: ORAL_TABLET | ORAL | 1 refills | Status: DC

## 2020-07-23 NOTE — Progress Notes (Signed)
Cardiology Office Note:    Date:  07/27/2020   ID:  Joy Williams, DOB 1950-08-15, MRN [id number]  PCP:  Lorrene Reid, [place]  Cardiologist:  Freada Bergeron, MD  Advanced Practice Provider:  No care team member to display Electrophysiologist:  None    Referring MD: Lorrene Reid, PA-C    History of Present Illness:    Joy Williams is a 70 y.o. female with a hx of MVP with severe MR s/p minimally imvasive bioprosthetic MVR with Dr. Ricard Dillon 06/2016, carotid artery disease, diastolic HF, history of TID, lung cancer s/p resection with chemo, tobacco use and hypothyroidism who returns to clinic for follow-up. Was previously followed by Dr. Meda Williams.  Patient was admitted 02/24/19-02/25/19 with chest discomfort.  High-sensitivity troponin levels were elevated (1471 >> 1625 >> 1246 >> 1150).  There was concern for non-ST elevation myocardial infarction.  However, cardiac catheterization demonstrated no CAD.  Echocardiogram demonstrated normal LV function and normally functioning mitral valve prosthesis.  She was followed by Dr. Debara Pickett in the hospital and he felt that her presentation was c/w [facility] (myocardial infarction with non-obstructive coronary arteries) or non-ACS troponin elevation.  The possible causes included myocarditis, coronary spasm, cardioembolic event. Underwent cardiac MRI that showed focal subendocardial LGE into mid inferolateral wall consistent with infarct seen on territory with concern for a thromboembolic event given clean cath. She has had no further chest pain at that time and was managed medically and worked with cardiac rehab at that time.  In November of 2020, she developed palpitations. 4 week zio placed with results detailed below:  4 weeks Zio patch monitor in November 2020  Sinus bradycardia to sinus tachycardia.  No atrial fibrillation.  3 runs of nonsustained ventricular tachycardia's with longest lasting 7  beats.  15 very short runs of SVTs, most probably atrial tachycardia.  Last saw Dr. Meda Williams on 06/24/19 where she was doing great. No palpitations or chest pain. Was having some minor episodes of orthostasis when going from a seated to a standing position. Her lisinopril was stopped at that time.   Today, the patient feels overall well. Continues to have orthostatic symptoms with changing positions that improves with fluid intake. No chest pain, SOB, LE edema, orthopnea, PND. No significant palpitations. She is taking all medications as prescribed. No HF symptoms.   Past Medical History:  Diagnosis Date  . Allergy   . Asthma    pt denies this, but is on Dulera  . Chronic diastolic congestive heart failure ([place])   . 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 COPD (chronic obstructive pulmonary disease) ([place])   . Family history of adverse reaction to anesthesia    Mother- very sensitive to medication  . Heart murmur    MVP  . Hypothyroidism   . 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 MVP (mitral valve prolapse)   . PONV (postoperative nausea and vomiting)   . Raynaud's disease    Raynaud's  . Raynaud's syndrome   . 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   31 mm [facility] mitral bovine bioprosthetic tissue valve placed via right mini thoracotomy approach  . Severe mitral regurgitation 11/15/2014  .  Shortness of breath dyspnea    with exertion  . STD (sexually transmitted disease)   . Telangiectasia   . Thyroid disease     Past Surgical History:  Procedure Laterality Date  . BACK SURGERY     x 3  Disectomy  . BREAST BIOPSY Left   . CARDIAC CATHETERIZATION N/A 03/08/2016   Procedure: Right/Left  Heart Cath and Coronary Angiography;  Surgeon: Sherren Mocha, MD;  Location: [place] [facility];  Service: Cardiovascular;  Laterality: N/A;  . CLAVICLE SURGERY Left 2013   plate to left collar bone  . LAPAROSCOPY     ? reason-age 35   . LEFT HEART CATH AND CORONARY ANGIOGRAPHY N/A 02/25/2019   Procedure: LEFT HEART CATH AND CORONARY ANGIOGRAPHY;  Surgeon: Jettie Booze, MD;  Location: [place] [facility];  Service: Cardiovascular;  Laterality: N/A;  . LUNG CANCER SURGERY    . MITRAL VALVE REPAIR Right 06/27/2016   Procedure: MINIMALLY INVASIVE MITRAL VALVE REPLACEMENT;  Surgeon: Rexene Alberts, MD;  Location: [place];  Service: Open Heart Surgery;  Laterality: Right;  . TEE WITHOUT CARDIOVERSION N/A 02/22/2016   Procedure: TRANSESOPHAGEAL ECHOCARDIOGRAM (TEE);  Surgeon: Dorothy Spark, MD;  Location: [place];  Service: Cardiovascular;  Laterality: N/A;  . TEE WITHOUT CARDIOVERSION N/A 06/27/2016   Procedure: TRANSESOPHAGEAL ECHOCARDIOGRAM (TEE);  Surgeon: Rexene Alberts, MD;  Location: [place];  Service: Open Heart Surgery;  Laterality: N/A;  . TONSILLECTOMY    . VIDEO ASSISTED THORACOSCOPY (VATS)/WEDGE RESECTION Left 12/30/2014   Bronchoscopy, Mediastinoscopy, Left VATS for Wedge resection LUL x2 adn LLL x1 - Dr. Elenor Quinones at [facility]  . VOCAL CORD INJECTION Left 2017   injected with botox  . wrist surgery Left 2015   plate to wrist     Current Medications: Current Meds  Medication Sig  . acetaminophen (TYLENOL) 500 MG tablet Take 1,000 mg by mouth every 6 (six) hours as needed for mild pain or headache.  Marland Kitchen atorvastatin (LIPITOR) 20 MG tablet TAKE 1 TABLET (20 MG TOTAL) BY MOUTH DAILY AT 6 PM.  . azelastine (ASTELIN) 0.1 % nasal spray Place 2 sprays into both nostrils 2 (two) times daily. Use in each nostril as directed  . Cholecalciferol (VITAMIN D-3) 125 MCG (5000 UT) TABS Take 5,000 Units by mouth daily.  . clopidogrel (PLAVIX) 75 MG tablet Take 1 tablet (75 mg total) by mouth  daily. Please make yearly appt with Dr. Meda Williams for February 2022 for future refills. Thank you 1st attempt  . levothyroxine (SYNTHROID) 175 MCG tablet TAKE 1 TABLET BY MOUTH DAILY BEFORE BREAKFAST  . metoprolol succinate (TOPROL XL) 25 MG 24 hr tablet Take 0.5 tablets (12.5 mg total) by mouth daily.  . Multiple Vitamins-Minerals (MULTIVITAMIN WITH MINERALS) tablet Take 1 tablet by mouth daily.  . nitroGLYCERIN (NITROSTAT) 0.4 MG SL tablet Place 1 tablet (0.4 mg total) under the tongue every 5 (five) minutes as needed for chest pain.  . TRELEGY ELLIPTA 100-62.5-25 MCG/INH AEPB TAKE 1 PUFF BY MOUTH EVERY DAY (Patient taking differently: Inhale 2 puffs into the lungs daily.)  . Turmeric 500 MG CAPS Take 500 mg by mouth daily.  . [DISCONTINUED] aspirin EC 81 MG tablet Take 1 tablet (81 mg total) by mouth daily.  . [DISCONTINUED] metoprolol tartrate (LOPRESSOR) 25 MG tablet TAKE 1 TABLET BY MOUTH TWICE A DAY     Allergies:   Penicillins   Social History   Socioeconomic History  . Marital status: Divorced    Spouse  name: Not on file  . Number of children: Not on file  . Years of education: 40  . Highest education level: Not on file  Occupational History  . Occupation: Retired  Tobacco Use  . Smoking status: Former Smoker    Packs/day: 0.25    Years: 24.00    Pack years: 6.00    Types: E-cigarettes, Cigarettes    Start date: 05/21/1983    Quit date: 08/26/2019    Years since quitting: 0.9  . Smokeless tobacco: Never Used  . Tobacco comment: Patient is interested in trying nicotene gum  Vaping Use  . Vaping Use: Former  . Quit date: 05/20/2006  Substance and Sexual Activity  . Alcohol use: Yes    Alcohol/week: 2.0 standard drinks    Types: 1 Shots of liquor, 1 Standard drinks or equivalent per week  . Drug use: No  . Sexual activity: Never    Partners: Male    Birth control/protection: Post-menopausal  Other Topics Concern  . Not on file  Social History Narrative  . Not on file    Social Determinants of Health   Financial Resource Strain: Not on file  Food Insecurity: Not on file  Transportation Needs: Not on file  Physical Activity: Not on file  Stress: Not on file  Social Connections: Not on file     Family History: The patient's family history includes Dementia in her father; Mitral valve prolapse in her brother, mother, and sister; Prostate cancer in her father. There is no history of Colon cancer, Colon polyps, Rectal cancer, Esophageal cancer, Stomach cancer, or Heart attack.  ROS:   Please see the history of present illness.    Review of Systems  Constitutional: Negative for chills, fever and malaise/fatigue.  HENT: Negative for hearing loss and sore throat.   Eyes: Negative for blurred vision, double vision, photophobia, pain, discharge and redness.  Respiratory: Negative for sputum production and wheezing.   Cardiovascular: Negative for chest pain, palpitations, orthopnea, claudication, leg swelling and PND.  Gastrointestinal: Negative for melena, nausea and vomiting.  Genitourinary: Negative for dysuria and flank pain.  Musculoskeletal: Positive for joint pain. Negative for falls and myalgias.  Neurological: Positive for dizziness. Negative for loss of consciousness.  Endo/Heme/Allergies: Negative for polydipsia.  Psychiatric/Behavioral: Negative for depression.    EKGs/Labs/Other Studies Reviewed:    The following studies were reviewed today: Cardiac catheterization 02/25/2019 Normal coronary arteries EF 55-65   Echocardiogram 02/25/2019 EF 60-65, mildly increased LV posterior wall thickness, normal RV SF, mild to moderate LAE, MVR with trace MR and no evidence of mitral stenosis, trivial TR, trivial PI  Echo 11/12/2018 EF 60-65, mild LVH, normal wall motion, normal RVSF, bioprosthetic mitral valve with trivial MR and no significant stenosis (mean gradient 10 mmHg), mild dilation of aortic root (37 mm), moderate LAE, possible PFO by color  Doppler  Carotid US 03/13/2018 Summary: Right Carotid: Velocities in the right ICA are consistent with a 1-39% stenosis. Left Carotid: Velocities in the left ICA are consistent with a 1-39% stenosis. Vertebrals: Bilateral vertebral arteries demonstrate antegrade flow.  Echo 03/12/2018 Moderate to severe LVH, EF 55-60, normal wall motion, grade 2 diastolic dysfunction, normally functioning mitral valve prosthesis with trivial stenosis (2 mmHg), moderate to severe LAE, trivial TR  4 weeks Zio patch monitor in November 2020  Sinus bradycardia to sinus tachycardia.  No atrial fibrillation.  3 runs of nonsustained ventricular tachycardia's with longest lasting 7 beats.  15 very short runs of SVTs, most probably atrial  tachycardia.  Cardiac MRI 03/17/19: IMPRESSION: 1. Normal LV size and systolic function, EF 84%. Mid inferolateral wall mild hypokinesis.  2.  Normal RV size and systolic function, EF 13%.  3. Bioprosthetic mitral valve, unable to comment on this due to artifact.  4. Delayed enhancement pattern is suggestive of focal infarct (subendocardial LGE) in left circumflex territory (?OM territory).  EKG:  EKG is  ordered today.  The ekg ordered today demonstrates NSR with HR 63, PAC, non-specific ST-T wave changes  Recent Labs: 06/15/2020: ALT 20; BUN 12; Creatinine, Ser 0.72; Potassium 4.0; Sodium 137; TSH 0.603  Recent Lipid Panel    Component Value Date/Time   CHOL 133 02/25/2019 0528   CHOL 150 11/11/2018 0946   CHOL 177 10/28/2014 1025   TRIG 74 02/25/2019 0528   TRIG 115 10/28/2014 1025   HDL 61 02/25/2019 0528   HDL 70 11/11/2018 0946   HDL 70 10/28/2014 1025   CHOLHDL 2.2 02/25/2019 0528   VLDL 15 02/25/2019 0528   LDLCALC 57 02/25/2019 0528   LDLCALC 64 11/11/2018 0946   LDLCALC 84 10/28/2014 1025   LDLDIRECT 63 06/15/2020 1446     Risk Assessment/Calculations:       Physical Exam:    VS:  BP 96/60   Pulse 63   Ht 5' 6.5" (1.689 m)   Wt  145 lb 6.4 oz (66 kg)   SpO2 94%   BMI 23.12 kg/m     Wt Readings from Last 3 Encounters:  07/27/20 145 lb 6.4 oz (66 kg)  06/15/20 147 lb 12.8 oz (67 kg)  04/19/20 147 lb (66.7 kg)     GEN:  Well nourished, well developed in no acute distress HEENT: Normal NECK: No JVD; No carotid bruits CARDIAC: RRR, no murmurs, rubs, gallops RESPIRATORY:  Clear to auscultation without rales, wheezing or rhonchi  ABDOMEN: Soft, non-tender, non-distended MUSCULOSKELETAL:  No edema; No deformity  SKIN: Warm and dry NEUROLOGIC:  Alert and oriented x 3 PSYCHIATRIC:  Normal affect   ASSESSMENT:    1. Mitral valve insufficiency, unspecified etiology    PLAN:    In order of problems listed above:  #History of NSTEMI (non-ST elevated myocardial infarction)  Presented with chest pain in 2020. Cath without obstructive disease, however, MRI showed focal LGE in the mid inferolateral wall highly suspicious for thromboembolic event. TTE with LVEF was 60 to 65%. Completed cardiac rehab with no recurrence of chest pain since that time.  -Okay to stop ASA 81mg  daily -Continue plavix 75mg  daily and atorvastatin 20mg  daily -Change metoprolol tartrate to succinate 25mg  XL at night -Stopped linisopril due to orthostasis  #Mitral valve insufficiency, 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 TTE 2020 with normally functioning mitral valve prosthesis with only trivial residual mitral regurgitation. Doing well with no angina or HF symptoms. -Continue SBE prophylaxis -Repeat TTE in 6 months at next visit for surveillance  #Bilateral carotid artery stenosis Bilateral ICA stenosis 1-39 by carotid US in October 2019.  -Continue clopidogrel, statin therapy -Okay to stop ASA 81mg  daily  #Hyperlipidemia LDL goal <70 LDL optimal on most recent lab work with LDL 63   -Continue lipitor  #Palpitations: Resolved. Monitor in 2020 with brief runs of SVT (? Atach). No Afib or  significant sustained arrhythmia. -Change tartrate to metop succinate 25mg  XL as above    Medication Adjustments/Labs and Tests Ordered: Current medicines are reviewed at length with the patient today.  Concerns regarding medicines are outlined above.  Orders  Placed This Encounter  Procedures  . EKG 12-Lead  . ECHOCARDIOGRAM COMPLETE   Meds ordered this encounter  Medications  . metoprolol succinate (TOPROL XL) 25 MG 24 hr tablet    Sig: Take 0.5 tablets (12.5 mg total) by mouth daily.    Dispense:  45 tablet    Refill:  3    Patient Instructions  Medication Instructions:  Your physician has recommended you make the following change in your medication:   Stop Aspirin 81 mg Stop Metoprolol tarrtate Start Metoprolol Succinate 12.5 mg by mouth at night  *If you need a refill on your cardiac medications before your next appointment, please call your pharmacy*   Lab Work:  If you have labs (blood work) drawn today and your tests are completely normal, you will receive your results only by: Marland Kitchen MyChart Message (if you have MyChart) OR . A paper copy in the mail If you have any lab test that is abnormal or we need to change your treatment, we will call you to review the results.   Testing/Procedures: Your physician has requested that you have an echocardiogram in 5 months. Echocardiography is a painless test that uses sound waves to create images of your heart. It provides your doctor with information about the size and shape of your heart and how well your heart's chambers and valves are working. This procedure takes approximately one hour. There are no restrictions for this procedure.    Follow-Up: At [facility], you and your health needs are our priority.  As part of our continuing mission to provide you with exceptional heart care, we have created designated Provider Care Teams.  These Care Teams include your primary Cardiologist (physician) and Advanced Practice  Providers (APPs -  Physician Assistants and Nurse Practitioners) who all work together to provide you with the care you need, when you need it.  We recommend signing up for the patient portal called "MyChart".  Sign up information is provided on this After Visit Summary.  MyChart is used to connect with patients for Virtual Visits (Telemedicine).  Patients are able to view lab/test results, encounter notes, upcoming appointments, etc.  Non-urgent messages can be sent to your provider as well.   To learn more about what you can do with MyChart, go to [email or web address].    Your next appointment:   6 month(s)  The format for your next appointment:   In Person  Provider:   You may see Freada Bergeron, MD or one of the following Advanced Practice Providers on your designated Care Team:    Richardson Dopp, PA-C  Robbie Lis, [place]         Signed, Freada Bergeron, MD  07/27/2020 11:10 AM    [place]

## 2020-07-27 ENCOUNTER — Other Ambulatory Visit: Payer: Self-pay

## 2020-07-27 ENCOUNTER — Encounter: Payer: Self-pay | Admitting: Cardiology

## 2020-07-27 ENCOUNTER — Ambulatory Visit (INDEPENDENT_AMBULATORY_CARE_PROVIDER_SITE_OTHER): Payer: Medicare Other | Admitting: Cardiology

## 2020-07-27 VITALS — BP 96/60 | HR 63 | Ht 66.5 in | Wt 145.4 lb

## 2020-07-27 DIAGNOSIS — I6523 Occlusion and stenosis of bilateral carotid arteries: Secondary | ICD-10-CM | POA: Diagnosis not present

## 2020-07-27 DIAGNOSIS — I214 Non-ST elevation (NSTEMI) myocardial infarction: Secondary | ICD-10-CM

## 2020-07-27 DIAGNOSIS — R002 Palpitations: Secondary | ICD-10-CM | POA: Diagnosis not present

## 2020-07-27 DIAGNOSIS — Z952 Presence of prosthetic heart valve: Secondary | ICD-10-CM | POA: Diagnosis not present

## 2020-07-27 DIAGNOSIS — E785 Hyperlipidemia, unspecified: Secondary | ICD-10-CM | POA: Diagnosis not present

## 2020-07-27 DIAGNOSIS — I340 Nonrheumatic mitral (valve) insufficiency: Secondary | ICD-10-CM

## 2020-07-27 DIAGNOSIS — Z953 Presence of xenogenic heart valve: Secondary | ICD-10-CM | POA: Diagnosis not present

## 2020-07-27 MED ORDER — METOPROLOL SUCCINATE ER 25 MG PO TB24: 12.5000 mg | ORAL_TABLET | Freq: Every day | ORAL | 3 refills | Status: DC

## 2020-07-27 NOTE — Patient Instructions (Addendum)
Medication Instructions:  Your physician has recommended you make the following change in your medication:   Stop Aspirin 81 mg Stop Metoprolol tarrtate Start Metoprolol Succinate 12.5 mg by mouth at night  *If you need a refill on your cardiac medications before your next appointment, please call your pharmacy*   Lab Work:  If you have labs (blood work) drawn today and your tests are completely normal, you will receive your results only by: Marland Kitchen MyChart Message (if you have MyChart) OR . A paper copy in the mail If you have any lab test that is abnormal or we need to change your treatment, we will call you to review the results.   Testing/Procedures: Your physician has requested that you have an echocardiogram in 5 months. Echocardiography is a painless test that uses sound waves to create images of your heart. It provides your doctor with information about the size and shape of your heart and how well your heart's chambers and valves are working. This procedure takes approximately one hour. There are no restrictions for this procedure.    Follow-Up: At [facility], you and your health needs are our priority.  As part of our continuing mission to provide you with exceptional heart care, we have created designated Provider Care Teams.  These Care Teams include your primary Cardiologist (physician) and Advanced Practice Providers (APPs -  Physician Assistants and Nurse Practitioners) who all work together to provide you with the care you need, when you need it.  We recommend signing up for the patient portal called "MyChart".  Sign up information is provided on this After Visit Summary.  MyChart is used to connect with patients for Virtual Visits (Telemedicine).  Patients are able to view lab/test results, encounter notes, upcoming appointments, etc.  Non-urgent messages can be sent to your provider as well.   To learn more about what you can do with MyChart, go to [email or web address].     Your next appointment:   6 month(s)  The format for your next appointment:   In Person  Provider:   You may see Freada Bergeron, MD or one of the following Advanced Practice Providers on your designated Care Team:    Richardson Dopp, PA-C  Vin McIntosh, [place]

## 2020-08-04 ENCOUNTER — Ambulatory Visit: Payer: Medicare Other | Admitting: Internal Medicine

## 2020-08-06 ENCOUNTER — Emergency Department (HOSPITAL_COMMUNITY): Payer: Medicare Other

## 2020-08-06 ENCOUNTER — Inpatient Hospital Stay (HOSPITAL_COMMUNITY)
Admission: EM | Admit: 2020-08-06 | Discharge: 2020-08-10 | DRG: 038 | Disposition: A | Discharge status: Home / Self Care | Payer: Medicare Other | Attending: Internal Medicine | Admitting: Internal Medicine

## 2020-08-06 ENCOUNTER — Other Ambulatory Visit: Payer: Self-pay

## 2020-08-06 ENCOUNTER — Encounter (HOSPITAL_COMMUNITY): Payer: Self-pay

## 2020-08-06 DIAGNOSIS — E785 Hyperlipidemia, unspecified: Secondary | ICD-10-CM | POA: Diagnosis present

## 2020-08-06 DIAGNOSIS — I6521 Occlusion and stenosis of right carotid artery: Secondary | ICD-10-CM | POA: Diagnosis present

## 2020-08-06 DIAGNOSIS — Z85118 Personal history of other malignant neoplasm of bronchus and lung: Secondary | ICD-10-CM

## 2020-08-06 DIAGNOSIS — Z880 Allergy status to penicillin: Secondary | ICD-10-CM

## 2020-08-06 DIAGNOSIS — R519 Headache, unspecified: Secondary | ICD-10-CM

## 2020-08-06 DIAGNOSIS — I6523 Occlusion and stenosis of bilateral carotid arteries: Secondary | ICD-10-CM | POA: Diagnosis present

## 2020-08-06 DIAGNOSIS — I110 Hypertensive heart disease with heart failure: Secondary | ICD-10-CM | POA: Diagnosis present

## 2020-08-06 DIAGNOSIS — R29818 Other symptoms and signs involving the nervous system: Secondary | ICD-10-CM | POA: Diagnosis not present

## 2020-08-06 DIAGNOSIS — I2510 Atherosclerotic heart disease of native coronary artery without angina pectoris: Secondary | ICD-10-CM | POA: Diagnosis present

## 2020-08-06 DIAGNOSIS — R29898 Other symptoms and signs involving the musculoskeletal system: Secondary | ICD-10-CM | POA: Diagnosis not present

## 2020-08-06 DIAGNOSIS — I639 Cerebral infarction, unspecified: Secondary | ICD-10-CM | POA: Diagnosis not present

## 2020-08-06 DIAGNOSIS — D649 Anemia, unspecified: Secondary | ICD-10-CM

## 2020-08-06 DIAGNOSIS — R531 Weakness: Secondary | ICD-10-CM | POA: Diagnosis not present

## 2020-08-06 DIAGNOSIS — D5912 Cold autoimmune hemolytic anemia: Secondary | ICD-10-CM | POA: Diagnosis present

## 2020-08-06 DIAGNOSIS — F17210 Nicotine dependence, cigarettes, uncomplicated: Secondary | ICD-10-CM | POA: Diagnosis present

## 2020-08-06 DIAGNOSIS — Z7982 Long term (current) use of aspirin: Secondary | ICD-10-CM

## 2020-08-06 DIAGNOSIS — Z79899 Other long term (current) drug therapy: Secondary | ICD-10-CM

## 2020-08-06 DIAGNOSIS — Z20822 Contact with and (suspected) exposure to covid-19: Secondary | ICD-10-CM | POA: Diagnosis present

## 2020-08-06 DIAGNOSIS — Z7951 Long term (current) use of inhaled steroids: Secondary | ICD-10-CM

## 2020-08-06 DIAGNOSIS — I5032 Chronic diastolic (congestive) heart failure: Secondary | ICD-10-CM | POA: Diagnosis present

## 2020-08-06 DIAGNOSIS — R29810 Facial weakness: Secondary | ICD-10-CM | POA: Diagnosis not present

## 2020-08-06 DIAGNOSIS — I959 Hypotension, unspecified: Secondary | ICD-10-CM | POA: Diagnosis not present

## 2020-08-06 DIAGNOSIS — I7300 Raynaud's syndrome without gangrene: Secondary | ICD-10-CM | POA: Diagnosis present

## 2020-08-06 DIAGNOSIS — Z720 Tobacco use: Secondary | ICD-10-CM

## 2020-08-06 DIAGNOSIS — J449 Chronic obstructive pulmonary disease, unspecified: Secondary | ICD-10-CM | POA: Diagnosis not present

## 2020-08-06 DIAGNOSIS — G4489 Other headache syndrome: Secondary | ICD-10-CM | POA: Diagnosis not present

## 2020-08-06 DIAGNOSIS — G459 Transient cerebral ischemic attack, unspecified: Secondary | ICD-10-CM | POA: Diagnosis not present

## 2020-08-06 DIAGNOSIS — Z8673 Personal history of transient ischemic attack (TIA), and cerebral infarction without residual deficits: Secondary | ICD-10-CM | POA: Diagnosis present

## 2020-08-06 DIAGNOSIS — Z79890 Hormone replacement therapy (postmenopausal): Secondary | ICD-10-CM

## 2020-08-06 DIAGNOSIS — R42 Dizziness and giddiness: Secondary | ICD-10-CM | POA: Diagnosis not present

## 2020-08-06 DIAGNOSIS — Z9221 Personal history of antineoplastic chemotherapy: Secondary | ICD-10-CM

## 2020-08-06 DIAGNOSIS — E039 Hypothyroidism, unspecified: Secondary | ICD-10-CM | POA: Diagnosis present

## 2020-08-06 DIAGNOSIS — R0902 Hypoxemia: Secondary | ICD-10-CM | POA: Diagnosis not present

## 2020-08-06 DIAGNOSIS — Z7902 Long term (current) use of antithrombotics/antiplatelets: Secondary | ICD-10-CM

## 2020-08-06 DIAGNOSIS — Z952 Presence of prosthetic heart valve: Secondary | ICD-10-CM

## 2020-08-06 LAB — CBC
HCT: 35.6 % — ABNORMAL LOW (ref 36.0–46.0)
Hemoglobin: 11.4 g/dL — ABNORMAL LOW (ref 12.0–15.0)
MCH: 27.9 pg (ref 26.0–34.0)
MCHC: 32 g/dL (ref 30.0–36.0)
MCV: 87.3 fL (ref 80.0–100.0)
Platelets: 212 10*3/uL (ref 150–400)
RBC: 4.08 MIL/uL (ref 3.87–5.11)
RDW: 12.7 % (ref 11.5–15.5)
WBC: 9.9 10*3/uL (ref 4.0–10.5)
nRBC: 0 % (ref 0.0–0.2)

## 2020-08-06 LAB — URINALYSIS, ROUTINE W REFLEX MICROSCOPIC
Bilirubin Urine: NEGATIVE
Glucose, UA: NEGATIVE mg/dL
Hgb urine dipstick: NEGATIVE
Ketones, ur: NEGATIVE mg/dL
Leukocytes,Ua: NEGATIVE
Nitrite: NEGATIVE
Protein, ur: NEGATIVE mg/dL
Specific Gravity, Urine: 1.017 (ref 1.005–1.030)
pH: 6 (ref 5.0–8.0)

## 2020-08-06 LAB — DIFFERENTIAL
Abs Immature Granulocytes: 0.09 10*3/uL — ABNORMAL HIGH (ref 0.00–0.07)
Basophils Absolute: 0 10*3/uL (ref 0.0–0.1)
Basophils Relative: 0 %
Eosinophils Absolute: 0.1 10*3/uL (ref 0.0–0.5)
Eosinophils Relative: 1 %
Immature Granulocytes: 1 %
Lymphocytes Relative: 11 %
Lymphs Abs: 1.1 10*3/uL (ref 0.7–4.0)
Monocytes Absolute: 0.6 10*3/uL (ref 0.1–1.0)
Monocytes Relative: 6 %
Neutro Abs: 8 10*3/uL — ABNORMAL HIGH (ref 1.7–7.7)
Neutrophils Relative %: 81 %

## 2020-08-06 LAB — RAPID URINE DRUG SCREEN, HOSP PERFORMED
Amphetamines: NOT DETECTED
Barbiturates: NOT DETECTED
Benzodiazepines: NOT DETECTED
Cocaine: NOT DETECTED
Opiates: NOT DETECTED
Tetrahydrocannabinol: NOT DETECTED

## 2020-08-06 LAB — APTT: aPTT: 27 seconds (ref 24–36)

## 2020-08-06 LAB — COMPREHENSIVE METABOLIC PANEL
ALT: 20 U/L (ref 0–44)
AST: 29 U/L (ref 15–41)
Albumin: 3.5 g/dL (ref 3.5–5.0)
Alkaline Phosphatase: 64 U/L (ref 38–126)
Anion gap: 6 (ref 5–15)
BUN: 14 mg/dL (ref 8–23)
CO2: 27 mmol/L (ref 22–32)
Calcium: 9 mg/dL (ref 8.9–10.3)
Chloride: 102 mmol/L (ref 98–111)
Creatinine, Ser: 0.72 mg/dL (ref 0.44–1.00)
GFR, Estimated: >60 mL/min (ref >60)
Glucose, Bld: 104 mg/dL — ABNORMAL HIGH (ref 70–99)
Potassium: 4 mmol/L (ref 3.5–5.1)
Sodium: 135 mmol/L (ref 135–145)
Total Bilirubin: 0.3 mg/dL (ref 0.3–1.2)
Total Protein: 6.3 g/dL — ABNORMAL LOW (ref 6.5–8.1)

## 2020-08-06 LAB — PROTIME-INR
INR: 1 (ref 0.8–1.2)
Prothrombin Time: 12.8 seconds (ref 11.4–15.2)

## 2020-08-06 MED ORDER — ACETAMINOPHEN 325 MG PO TABS
650.0000 mg | ORAL_TABLET | ORAL | Status: DC | PRN
  Administered 2020-08-07 – 2020-08-10 (×4): 650 mg via ORAL
  Filled 2020-08-06 (×4): qty 2

## 2020-08-06 MED ORDER — HYDROMORPHONE HCL 1 MG/ML IJ SOLN
0.5000 mg | Freq: Once | INTRAMUSCULAR | Status: AC
  Administered 2020-08-06: 0.5 mg via INTRAVENOUS
  Filled 2020-08-06: qty 1

## 2020-08-06 MED ORDER — ONDANSETRON HCL 4 MG/2ML IJ SOLN
4.0000 mg | Freq: Once | INTRAMUSCULAR | Status: AC
  Administered 2020-08-06: 4 mg via INTRAVENOUS
  Filled 2020-08-06: qty 2

## 2020-08-06 MED ORDER — IOHEXOL 350 MG/ML SOLN
80.0000 mL | Freq: Once | INTRAVENOUS | Status: AC | PRN
  Administered 2020-08-06: 80 mL via INTRAVENOUS

## 2020-08-06 MED ORDER — ATORVASTATIN CALCIUM 10 MG PO TABS
20.0000 mg | ORAL_TABLET | Freq: Every day | ORAL | Status: DC
  Administered 2020-08-07 – 2020-08-09 (×3): 20 mg via ORAL
  Filled 2020-08-06 (×3): qty 2

## 2020-08-06 MED ORDER — FLUTICASONE-UMECLIDIN-VILANT 100-62.5-25 MCG/INH IN AEPB: 2.0000 | INHALATION_SPRAY | Freq: Every day | RESPIRATORY_TRACT | Status: DC

## 2020-08-06 MED ORDER — LEVOTHYROXINE SODIUM 75 MCG PO TABS
175.0000 ug | ORAL_TABLET | Freq: Every day | ORAL | Status: DC
  Administered 2020-08-07 – 2020-08-10 (×3): 175 ug via ORAL
  Filled 2020-08-06 (×4): qty 1

## 2020-08-06 MED ORDER — LACTATED RINGERS IV SOLN: INTRAVENOUS | Status: DC

## 2020-08-06 MED ORDER — MAGNESIUM SULFATE IN D5W 1-5 GM/100ML-% IV SOLN
1.0000 g | Freq: Once | INTRAVENOUS | Status: AC
  Administered 2020-08-06: 1 g via INTRAVENOUS
  Filled 2020-08-06: qty 100

## 2020-08-06 MED ORDER — ACETAMINOPHEN 160 MG/5ML PO SOLN: 650.0000 mg | ORAL | Status: DC | PRN

## 2020-08-06 MED ORDER — ACETAMINOPHEN 650 MG RE SUPP: 650.0000 mg | RECTAL | Status: DC | PRN

## 2020-08-06 MED ORDER — STROKE: EARLY STAGES OF RECOVERY BOOK
Freq: Once | Status: AC
  Filled 2020-08-06: qty 1

## 2020-08-06 NOTE — ED Notes (Signed)
Attempt placement of 20g IV in RAFA  For CTA without success

## 2020-08-06 NOTE — ED Notes (Signed)
Magnesium over 20 min for HA per MD order

## 2020-08-06 NOTE — ED Notes (Signed)
Patient back from CT in room 33

## 2020-08-06 NOTE — ED Notes (Signed)
IV team at the bedside to place 20g for CTA

## 2020-08-06 NOTE — ED Provider Notes (Signed)
[facility] EMERGENCY DEPARTMENT Provider Note   CSN: [id number] Arrival date & time: 08/06/20  1648     History Chief Complaint  Patient presents with  . Dizziness    Patient at home when symptoms of loss of coordination developed. Patient stated to have numbness/weakness on the left side. Symptoms resolved when EMS arrived. Patient still has complaints of headache    Joy Williams is a 70 y.o. female.  HPI Reports that she was having a normal day and felt well.  He reports that she was watching her granddaughter in the yard.  She reports that all of a sudden, she felt a profound dizziness, over her whole body.  She reports it was slightly disorienting.  She had also the feeling of incoordination of her left arm.  She reports she could move it but it did not seem to be moving in accordance with her directives.  She did not perceive her leg to go weak or numb.  She reports she was able to stay standing but needed to hold on for some assistance.  She went into her house and sat down.  Within approximately 15 minutes the symptoms had resolved but she had a residual intense pressure aching headache on the top of her head.  Reports she felt nauseated initially but that has resolved.  She reports the pain is not as severe now.    Past Medical History:  Diagnosis Date  . Allergy   . Asthma    pt denies this, but is on Dulera  . Chronic diastolic congestive heart failure ([place])   . 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 (had to have botox injection)  . COPD (chronic obstructive pulmonary disease) ([place])   . Family history of adverse reaction to anesthesia    Mother- very sensitive to medication  . Heart murmur    MVP  . Hypothyroidism   . 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 MVP (mitral valve prolapse)   . PONV (postoperative nausea and vomiting)   . Raynaud's disease    Raynaud's  . Raynaud's syndrome   . 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 Severe mitral regurgitation 11/15/2014  . Shortness of breath dyspnea    with exertion  . STD (sexually transmitted disease)   . Telangiectasia   . Thyroid disease     Patient Active Problem List   Diagnosis Date Noted  . Encounter for counseling 02/08/2020  . Suppression of immune system - subtherapeutic ([place]) 07/14/2019  . History of TIA (transient ischemic attack) 05/10/2019  . Non-ST elevated myocardial infarction ([place]) 02/25/2019  . NSTEMI (non-ST elevated myocardial infarction) ([place]) 02/25/2019  . Elevated troponin level not due to acute coronary syndrome   . 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 On statin therapy 04/27/2018  . Memory loss or impairment 04/27/2018  . Complicated migraine 81/05/7508  . Hyperlipidemia LDL goal <70 03/12/2018  . 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 Stroke-like episode ([place]) s/p tPA 03/11/2018  . Dysuria 10/14/2017  . Abnormal finding on urinalysis 10/14/2017  . Pharyngitis 10/14/2017  .  Closed nondisplaced fracture of base of fifth metacarpal bone of right hand 05/27/2017  . Decreased range of motion of right ankle 05/27/2017  . Pain and swelling of right wrist 05/27/2017  . Age-related osteoporosis without current pathological fracture 02/20/2017  . ASCUS of cervix with negative high risk HPV 01/29/2017  . 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 Tobacco use disorder 01/22/2017  . Tobacco abuse counseling 01/22/2017  . Neoplastic malignant related fatigue 01/22/2017  . Vitamin D  insufficiency 01/22/2017  . Estrogen deficiency 01/14/2017  . H/O cardiac catheterization- in prep for lung ca sx 01/06/2017  . Hypothyroidism 01/06/2017  . Previous back surgeries 01/06/2017  . 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 Chronic diastolic congestive heart failure ([place])   . Chronic obstructive pulmonary disease ([place])   . 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 Antineoplastic chemotherapy induced anemia 05/05/2015  . History of epistaxis 03/30/2015  . Raynaud's disease 01/19/2015  . Malignant neoplasm of left lung ([place]) 12/30/2014  . Lung cancer ([place]) 12/30/2014  . Malignant neoplasm of overlapping sites of left lung ([place]) 12/06/2014  . Nonintractable episodic headache 12/06/2014  . Severe mitral regurgitation 11/15/2014  . 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 Asthmatic bronchitis , chronic ([place]) 10/03/2014  . Multiple pulmonary nodules 10/03/2014  . Current Cigarette smoker 10/03/2014    Past Surgical History:  Procedure Laterality Date  . BACK SURGERY     x 3  Disectomy  . BREAST BIOPSY Left   . CARDIAC CATHETERIZATION N/A 03/08/2016   Procedure: Right/Left Heart Cath and Coronary Angiography;  Surgeon: Sherren Mocha, MD;  Location: [facility];  Service: Cardiovascular;  Laterality: N/A;  . CLAVICLE SURGERY Left 2013   plate to left collar bone  . LAPAROSCOPY     ? reason-age 38   . LEFT HEART CATH AND CORONARY ANGIOGRAPHY N/A 02/25/2019   Procedure: LEFT HEART CATH AND CORONARY ANGIOGRAPHY;  Surgeon: Jettie Booze, MD;  Location: [facility];  Service: Cardiovascular;  Laterality: N/A;  . LUNG CANCER SURGERY    . MITRAL VALVE REPAIR Right 06/27/2016   Procedure: MINIMALLY INVASIVE MITRAL VALVE REPLACEMENT;  Surgeon: Rexene Alberts, MD;  Location: [facility];  Service: Open Heart Surgery;  Laterality: Right;  . TEE WITHOUT CARDIOVERSION N/A 02/22/2016   Procedure: TRANSESOPHAGEAL ECHOCARDIOGRAM (TEE);   Surgeon: Dorothy Spark, MD;  Location: [facility];  Service: Cardiovascular;  Laterality: N/A;  . TEE WITHOUT CARDIOVERSION N/A 06/27/2016   Procedure: TRANSESOPHAGEAL ECHOCARDIOGRAM (TEE);  Surgeon: Rexene Alberts, MD;  Location: [facility];  Service: Open Heart Surgery;  Laterality: N/A;  . TONSILLECTOMY    . VIDEO ASSISTED THORACOSCOPY (VATS)/WEDGE RESECTION Left 12/30/2014   Bronchoscopy, Mediastinoscopy, Left VATS for Wedge resection LUL x2 adn LLL x1 - Dr. Elenor Quinones at [facility]  . VOCAL CORD INJECTION Left 2017   injected with botox  . wrist surgery Left 2015   plate to wrist      OB History    Gravida  3   Para  2   Term  2   Preterm      AB  1   Living  2     SAB      IAB      Ectopic      Multiple      Live Births  2  Family History  Problem Relation Age of Onset  . Mitral valve prolapse Mother   . Dementia Father   . Prostate cancer Father   . Mitral valve prolapse Brother   . Mitral valve prolapse Sister   . Colon cancer Neg Hx   . Colon polyps Neg Hx   . Rectal cancer Neg Hx   . Esophageal cancer Neg Hx   . Stomach cancer Neg Hx   . Heart attack Neg Hx     Social History   Tobacco Use  . Smoking status: Former Smoker    Packs/day: 0.25    Years: 24.00    Pack years: 6.00    Types: E-cigarettes, Cigarettes    Start date: 05/21/1983    Quit date: 08/26/2019    Years since quitting: 0.9  . Smokeless tobacco: Never Used  . Tobacco comment: Patient is interested in trying nicotene gum  Vaping Use  . Vaping Use: Former  . Quit date: 05/20/2006  Substance Use Topics  . Alcohol use: Yes    Alcohol/week: 2.0 standard drinks    Types: 1 Shots of liquor, 1 Standard drinks or equivalent per week  . Drug use: No    Home Medications Prior to Admission medications   Medication Sig Start Date End Date Taking? Authorizing Provider  acetaminophen (TYLENOL) 500 MG tablet Take 1,000 mg by mouth every 6 (six) hours as needed for mild pain or  headache.    [provider]  atorvastatin (LIPITOR) 20 MG tablet TAKE 1 TABLET (20 MG TOTAL) BY MOUTH DAILY AT 6 PM. 11/10/19   Dorothy Spark, MD  azelastine (ASTELIN) 0.1 % nasal spray Place 2 sprays into both nostrils 2 (two) times daily. Use in each nostril as directed 12/27/19   Lorrene Reid, PA-C  Cholecalciferol (VITAMIN D-3) 125 MCG (5000 UT) TABS Take 5,000 Units by mouth daily.    [provider]  clopidogrel (PLAVIX) 75 MG tablet Take 1 tablet (75 mg total) by mouth daily. Please make yearly appt with Dr. Meda Coffee for February 2022 for future refills. Thank you 1st attempt 05/23/20   Dorothy Spark, MD  levothyroxine (SYNTHROID) 175 MCG tablet TAKE 1 TABLET BY MOUTH DAILY BEFORE BREAKFAST 06/16/20   Lorrene Reid, PA-C  metoprolol succinate (TOPROL XL) 25 MG 24 hr tablet Take 0.5 tablets (12.5 mg total) by mouth daily. 07/27/20   Freada Bergeron, MD  Multiple Vitamins-Minerals (MULTIVITAMIN WITH MINERALS) tablet Take 1 tablet by mouth daily.    [provider]  nitroGLYCERIN (NITROSTAT) 0.4 MG SL tablet Place 1 tablet (0.4 mg total) under the tongue every 5 (five) minutes as needed for chest pain. 03/01/19   Weaver, Scott T, PA-C  TRELEGY ELLIPTA 100-62.5-25 MCG/INH AEPB TAKE 1 PUFF BY MOUTH EVERY DAY Patient taking differently: Inhale 2 puffs into the lungs daily. 09/13/19   Tanda Rockers, MD  Turmeric 500 MG CAPS Take 500 mg by mouth daily.    [provider]    Allergies    Penicillins  Review of Systems   Review of Systems 10 systems reviewed negative except as per HPI Physical Exam Updated Vital Signs BP (!) 113/57   Pulse 69   Temp 98.4 F (36.9 C) (Oral)   Resp 20   Ht 5\' 7"  (1.702 m)   Wt 65.8 kg   SpO2 97%   BMI 22.71 kg/m   Physical Exam Constitutional:      Appearance: Normal appearance. She is well-developed.  HENT:  Head: Normocephalic and atraumatic.     Mouth/Throat:     Mouth: Mucous membranes are  moist.     Pharynx: Oropharynx is clear.  Eyes:     Extraocular Movements: Extraocular movements intact.     Conjunctiva/sclera: Conjunctivae normal.     Pupils: Pupils are equal, round, and reactive to light.  Cardiovascular:     Rate and Rhythm: Normal rate and regular rhythm.     Heart sounds: Normal heart sounds.  Pulmonary:     Effort: Pulmonary effort is normal.     Breath sounds: Normal breath sounds.  Abdominal:     General: Bowel sounds are normal. There is no distension.     Palpations: Abdomen is soft.     Tenderness: There is no abdominal tenderness.  Musculoskeletal:        General: No swelling or tenderness. Normal range of motion.     Cervical back: Neck supple.     Right lower leg: No edema.     Left lower leg: No edema.  Skin:    General: Skin is warm and dry.  Neurological:     General: No focal deficit present.     Mental Status: She is alert and oriented to person, place, and time.     GCS: GCS eye subscore is 4. GCS verbal subscore is 5. GCS motor subscore is 6.     Cranial Nerves: No cranial nerve deficit.     Sensory: No sensory deficit.     Motor: No weakness.     Coordination: Coordination normal.     Comments: GCS 15.  Cognitive function is normal.  Speech is clear without evidence of aphasia.  Normal finger-nose exam bilaterally.  Normal visual fields to confrontational testing.  Noted strength 5\5x4 extremities.  Normal heel shin exam.  Psychiatric:        Mood and Affect: Mood normal.     ED Results / Procedures / Treatments   Labs (all labs ordered are listed, but only abnormal results are displayed) Labs Reviewed  CBC - Abnormal; Notable for the following components:      Result Value   Hemoglobin 11.4 (*)    HCT 35.6 (*)    All other components within normal limits  DIFFERENTIAL - Abnormal; Notable for the following components:   Neutro Abs 8.0 (*)    Abs Immature Granulocytes 0.09 (*)    All other components within normal limits   COMPREHENSIVE METABOLIC PANEL - Abnormal; Notable for the following components:   Glucose, Bld 104 (*)    Total Protein 6.3 (*)    All other components within normal limits  RESP PANEL BY RT-PCR (FLU A&B, COVID) ARPGX2  PROTIME-INR  APTT  ETHANOL  RAPID URINE DRUG SCREEN, HOSP PERFORMED  URINALYSIS, ROUTINE W REFLEX MICROSCOPIC  I-STAT CHEM 8, ED    EKG EKG Interpretation  Date/Time:  Sunday August 06 2020 16:50:57 EDT Ventricular Rate:  72 PR Interval:    QRS Duration: 113 QT Interval:  431 QTC Calculation: 472 R Axis:   -9 Text Interpretation: Sinus rhythm Borderline intraventricular conduction delay Borderline ST elevation, anterior leads no acute ischemic changes, similar to previous Confirmed by Charlesetta Shanks [phone]) on 08/06/2020 5:42:08 PM   Radiology CT HEAD WO CONTRAST  Result Date: 08/06/2020 CLINICAL DATA:  Neuro deficit, acute, stroke suspected. Additional history provided: Bilateral ear pain/pressure, headache. EXAM: CT HEAD WITHOUT CONTRAST TECHNIQUE: Contiguous axial images were obtained from the base of the skull through the vertex without  intravenous contrast. COMPARISON:  Brain MRI 03/12/2018. FINDINGS: Brain: Cerebral volume is normal for age. Lacunar infarct within the left basal ganglia, new as compared to the brain MRI of 03/12/2018 but otherwise age indeterminate (series 3, image 13) (series 5, image 24). Known small infarcts within the left thalamus and left cerebellar hemisphere better appreciated on the prior brain MRI of 03/12/2018. There is no acute intracranial hemorrhage. No demarcated cortical infarct. No extra-axial fluid collection. No evidence of intracranial mass. No midline shift. Vascular: No hyperdense vessel.  Atherosclerotic calcifications Skull: Normal. Negative for fracture or focal lesion. Sinuses/Orbits: Visualized orbits show no acute finding. No significant paranasal sinus disease at the imaged levels. IMPRESSION: No evidence of acute  intracranial hemorrhage or acute demarcated cortical infarction. A lacunar infarct within the left basal ganglia is new as compared to the brain MRI of 03/12/2018, but otherwise age indeterminate. Known small infarcts within the left thalamus and left cerebellar hemisphere were better appreciated on the prior brain MRI of 03/12/2018. Electronically Signed   By: Kellie Simmering DO   On: 08/06/2020 18:30    Procedures Procedures  CRITICAL CARE Performed by: Charlesetta Shanks   Total critical care time: 30 minutes  Critical care time was exclusive of separately billable procedures and treating other patients.  Critical care was necessary to treat or prevent imminent or life-threatening deterioration.  Critical care was time spent personally by me on the following activities: development of treatment plan with patient and/or surrogate as well as nursing, discussions with consultants, evaluation of patient's response to treatment, examination of patient, obtaining history from patient or surrogate, ordering and performing treatments and interventions, ordering and review of laboratory studies, ordering and review of radiographic studies, pulse oximetry and re-evaluation of patient's condition. Medications Ordered in ED Medications  lactated ringers infusion ( Intravenous New Bag/Given 08/06/20 1830)  HYDROmorphone (DILAUDID) injection 0.5 mg (0.5 mg Intravenous Given 08/06/20 1827)  ondansetron (ZOFRAN) injection 4 mg (4 mg Intravenous Given 08/06/20 1828)    ED Course  I have reviewed the triage vital signs and the nursing notes.  Pertinent labs & imaging results that were available during my care of the patient were reviewed by me and considered in my medical decision making (see chart for details).  Clinical Course as of 08/06/20 2104  Nancy Fetter Aug 06, 2020  1821 : Reviewed with Dr. Quinn Axe neurology.  She advises to proceed with CT angio head and neck.  At this time, with symptoms cleared, will be seen  by Dr.Kaliqdina.  [MP]    Clinical Course User Index [MP] Charlesetta Shanks, MD   MDM Rules/Calculators/A&P                          Patient presents as outlined.  Does have prior history of stroke.  Presentation is concerning for CVA with a intense vertigo and left upper extremity dysfunction.  At time of evaluation, all neurologic symptoms are resolved.  She does have normal neurologic exam.  Code stroke was not initiated.  Her symptoms onset or it 1530 and resolved in 20 minutes.  She did have residual headache which is diffuse at the top of the head.  CT does not show any evidence at this time of intracranial bleeding.  New stroke as compared to prior study but not necessarily acute based on CT appearance.  Patient is normotensive.  Review of systems does not suggest any infectious etiology.  Patient has been feeling very well and  symptoms came on very abruptly.  I have reviewed the case with Dr. Quinn Axe and will proceed with CT angiograms.  Subsequently patient will need MRI.  Dr. Marcie Mowers will do neurology consult.  Plan to admit for stroke evaluation. Final Clinical Impression(s) / ED Diagnoses Final diagnoses:  Vertigo  Weakness of left upper extremity  Bad headache    Rx / DC Orders ED Discharge Orders    None       Charlesetta Shanks, MD 08/06/20 2105

## 2020-08-06 NOTE — Consult Note (Signed)
NEUROLOGY CONSULTATION NOTE   Date of service: August 06, 2020 Patient Name: Joy Williams MRN:  [id number] DOB:  Jul 05, 1950 Reason for consult: "L arm weakness and incoordination" _ _ _   _ __   _ __ _ _  __ __   _ __   __ _  History of Present Joy Williams is a 71 y.o. female with PMH significant for chronic diastolic CHF, COPD, raynauds syndrome, telangectasia, severe mitral regurgitation, hypothyroidism who presents with dizziness and left arm incoordination. She reports that she was standing in the front lawn and had feeling of off's with the ground underneath her walking along with it left hand incoordination.  The episode lasted for about 25 minutes before it went away.  No clear triggers.  Nothing made it worse or better.  Reports she had headache at the vertex that she describes as sharp and pressure and an 8 out of 10 that started right after the episode and persisted for a couple hours before getting better.  She is not someone who has headaches.  She was on aspirin at home and stopped about a week ago.  No history of diabetes, hypertension, hyperlipidemia, endorses personal history of strokes in the past.  No family history of strokes.  Endorses lightheadedness sometimes that comes on when she is trying to sit or stand up.  The feeling today however was much different than her typical lightheaded episode.   ROS   Constitutional Denies weight loss, fever and chills.   HEENT Denies changes in vision and hearing.   Respiratory Denies SOB and cough.   CV Denies palpitations and CP   GI Denies abdominal pain, nausea, vomiting and diarrhea.   GU Denies dysuria and urinary frequency.   MSK Denies myalgia and joint pain.   Skin Denies rash and pruritus.   Neurological Denies headache and syncope.   Psychiatric Denies recent changes in mood. Denies anxiety and depression.    Past History   Past Medical History:  Diagnosis Date  . Allergy   . Asthma    pt denies this,  but is on Dulera  . Chronic diastolic congestive heart failure ([place])   . 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 COPD (chronic obstructive pulmonary disease) ([place])   . Family history of adverse reaction to anesthesia    Mother- very sensitive to medication  . Heart murmur    MVP  . Hypothyroidism   . 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 MVP (mitral valve prolapse)   . PONV (postoperative nausea and vomiting)   . Raynaud's disease    Raynaud's  . Raynaud's syndrome   . 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 Severe mitral regurgitation 11/15/2014  . Shortness of breath dyspnea    with exertion  . STD (sexually transmitted disease)   . Telangiectasia   . Thyroid disease    Past Surgical History:  Procedure Laterality Date  . BACK SURGERY     x 3  Disectomy  . BREAST BIOPSY Left   . CARDIAC CATHETERIZATION N/A 03/08/2016   Procedure: Right/Left  Heart Cath and Coronary Angiography;  Surgeon: Sherren Mocha, MD;  Location: [place] [facility];  Service: Cardiovascular;  Laterality: N/A;  . CLAVICLE SURGERY Left 2013   plate to left collar bone  . LAPAROSCOPY     ? reason-age 29   . LEFT HEART CATH AND CORONARY ANGIOGRAPHY N/A 02/25/2019   Procedure: LEFT HEART CATH AND CORONARY ANGIOGRAPHY;  Surgeon: Jettie Booze, MD;  Location: [place] [facility];  Service: Cardiovascular;  Laterality: N/A;  . LUNG CANCER SURGERY    . MITRAL VALVE REPAIR Right 06/27/2016   Procedure: MINIMALLY INVASIVE MITRAL VALVE REPLACEMENT;  Surgeon: Rexene Alberts, MD;  Location: [place];  Service: Open Heart  Surgery;  Laterality: Right;  . TEE WITHOUT CARDIOVERSION N/A 02/22/2016   Procedure: TRANSESOPHAGEAL ECHOCARDIOGRAM (TEE);  Surgeon: Dorothy Spark, MD;  Location: [place];  Service: Cardiovascular;  Laterality: N/A;  . TEE WITHOUT CARDIOVERSION N/A 06/27/2016   Procedure: TRANSESOPHAGEAL ECHOCARDIOGRAM (TEE);  Surgeon: Rexene Alberts, MD;  Location: [place];  Service: Open Heart Surgery;  Laterality: N/A;  . TONSILLECTOMY    . VIDEO ASSISTED THORACOSCOPY (VATS)/WEDGE RESECTION Left 12/30/2014   Bronchoscopy, Mediastinoscopy, Left VATS for Wedge resection LUL x2 adn LLL x1 - Dr. Elenor Quinones at [facility]  . VOCAL CORD INJECTION Left 2017   injected with botox  . wrist surgery Left 2015   plate to wrist    Family History  Problem Relation Age of Onset  . Mitral valve prolapse Mother   . Dementia Father   . Prostate cancer Father   . Mitral valve prolapse Brother   . Mitral valve prolapse Sister   . Colon cancer Neg Hx   . Colon polyps Neg Hx   . Rectal cancer Neg Hx   . Esophageal cancer Neg Hx   . Stomach cancer Neg Hx   . Heart attack Neg Hx    Social History   Socioeconomic History  . Marital status: Divorced    Spouse name: Not on file  . Number of children: Not on file  . Years of education: 72  . Highest education level: Not on file  Occupational History  . Occupation: Retired  Tobacco Use  . Smoking status: Former Smoker    Packs/day: 0.25    Years: 24.00    Pack years: 6.00    Types: E-cigarettes, Cigarettes    Start date: 05/21/1983    Quit date: 08/26/2019    Years since quitting: 0.9  . Smokeless tobacco: Never Used  . Tobacco comment: Patient is interested in trying nicotene gum  Vaping Use  . Vaping Use: Former  . Quit date: 05/20/2006  Substance and Sexual Activity  . Alcohol use: Yes    Alcohol/week: 2.0 standard drinks    Types: 1 Shots of liquor, 1 Standard drinks or equivalent per week  . Drug use: No  . Sexual activity: Never    Partners: Male    Birth  control/protection: Post-menopausal  Other Topics Concern  . Not on file  Social History Narrative  . Not on file   Social Determinants of Health   Financial Resource Strain: Not on file  Food Insecurity: Not on file  Transportation Needs: Not on file  Physical Activity: Not on file  Stress: Not on file  Social Connections: Not on file   Allergies  Allergen Reactions  . Penicillins Hives    Unknown, occurred as a child Has patient had a PCN reaction causing immediate rash, facial/tongue/throat swelling, SOB  or lightheadedness with hypotension: Yes Has patient had a PCN reaction causing severe rash involving mucus membranes or skin necrosis: No Has patient had a PCN reaction that required hospitalization  Has patient had a PCN reaction occurring within the last 10 years: No If all of the above answers are "NO", then may proceed with Cephalosporin use. No    Medications  (Not in a hospital admission)    Vitals   Vitals:   08/06/20 1730 08/06/20 1800 08/06/20 1830 08/06/20 1900  BP: (!) 110/92 (!) 115/48 122/74 109/60  Pulse: 71 66 68 66  Resp: 18 (!) 24 20 14   Temp:      TempSrc:      SpO2: 99% 96% 99% 96%  Weight:      Height:         Body mass index is 22.71 kg/m.  Physical Exam   General: Laying comfortably in bed; in no acute distress.  HENT: Normal oropharynx and mucosa. Normal external appearance of ears and nose.  Neck: Supple, no pain or tenderness  CV: No JVD. No peripheral edema.  Pulmonary: Symmetric Chest rise. Normal respiratory effort.  Abdomen: Soft to touch, non-tender.  Ext: No cyanosis, edema, or deformity  Skin: Telangiectasias. Normal palpation of skin.  Musculoskeletal: Normal digits and nails by inspection. No clubbing.   Neurologic Examination  Mental status/Cognition: Alert, oriented to self, place, month and year, good attention.  Speech/language: Fluent, comprehension intact, object naming intact, repetition intact.  Cranial  nerves:   CN II Pupils equal and reactive to light, no VF deficits    CN III,IV,VI EOM intact, no gaze preference or deviation, no nystagmus    CN V normal sensation in V1, V2, and V3 segments bilaterally    CN VII no asymmetry, no nasolabial fold flattening    CN VIII normal hearing to speech    CN IX & X normal palatal elevation, no uvular deviation    CN XI 5/5 head turn and 5/5 shoulder shrug bilaterally    CN XII midline tongue protrusion    Motor:  Muscle bulk: normal, tone normal, pronator drift none tremor yes end movement tremors in BL arms. Mvmt Root Nerve  Muscle Right Left Comments  SA C5/6 Ax Deltoid 5 5   EF C5/6 Mc Biceps 5 5   EE C6/7/8 Rad Triceps 5 5   WF C6/7 Med FCR     WE C7/8 PIN ECU     F Ab C8/T1 U ADM/FDI 5 4+   HF L1/2/3 Fem Illopsoas 5 5   KE L2/3/4 Fem Quad 5 5   DF L4/5 D Peron Tib Ant 5 5   PF S1/2 Tibial Grc/Sol 5 5    Reflexes:  Right Left Comments  Pectoralis      Biceps (C5/6) 2 2   Brachioradialis (C5/6) 2 2    Triceps (C6/7) 2 2    Patellar (L3/4) 2 2    Achilles (S1)      Hoffman      Plantar     Jaw jerk    Sensation:  Light touch Intact throughout   Pin prick    Temperature    Vibration   Proprioception    Coordination/Complex Motor:  - Finger to Nose intact BL - Heel to shin intact BL - Rapid alternating movement are mildly slowed in L hand - Gait: Deferred. Labs   CBC:  Recent Labs  Lab 08/06/20 1800  WBC 9.9  NEUTROABS 8.0*  HGB 11.4*  HCT 35.6*  MCV 87.3  PLT 502    Basic Metabolic Panel:  Lab Results  Component Value Date   NA 135 08/06/2020   K 4.0 08/06/2020   CO2 27 08/06/2020   GLUCOSE 104 (H) 08/06/2020   BUN 14 08/06/2020   CREATININE 0.72 08/06/2020   CALCIUM 9.0 08/06/2020   GFRNONAA >60 08/06/2020   GFRAA 99 06/15/2020   Lipid Panel:  Lab Results  Component Value Date   LDLCALC 57 02/25/2019   HgbA1c:  Lab Results  Component Value Date   HGBA1C 5.3 03/12/2018   Urine Drug Screen:      Component Value Date/Time   LABOPIA NONE DETECTED 03/11/2018 1818   COCAINSCRNUR NONE DETECTED 03/11/2018 1818   LABBENZ NONE DETECTED 03/11/2018 1818   AMPHETMU NONE DETECTED 03/11/2018 1818   THCU NONE DETECTED 03/11/2018 1818   LABBARB NONE DETECTED 03/11/2018 1818    Alcohol Level     Component Value Date/Time   ETH <10 03/11/2018 1230    CT Head without contrast: CTH was negative for a large hypodensity concerning for a large territory infarct or hyperdensity concerning for an ICH  CT angio Head and Neck with contrast: pending  MRI Brain  Pending.  Impression   Joy Williams is a 70 y.o. female with PMH significant for chronic diastolic CHF, COPD, raynauds syndrome, telangectasia, severe mitral regurgitation, hypothyroidism who presents with dizziness and left arm incoordination along with 8/10 headache that lasted 25 mins and self resolved. Her neurologic examination is notable for mild L hand grip strength weakness. Suspect that this is likely a small stroke vs TIA.  Given reported headache at onset and history of telangiactasias, would hold off on Aspirin for now until MRI and CT angio to rule out small bleed.  Recommendations  Plan:  Recommend that primary team order following: - Frequent Neuro checks per stroke unit protocol - MRI Brain pending - CT angio head and neck with and without contrat pending. - TTE pending. - Recommend obtaining Lipid panel with LDL - Please start statin if LDL > 70 - Recommend HbA1c - Antithrombotic - start Aspirin 81mg  daily if MRI and CTA negative for small ICH. - Recommend DVT ppx - SBP goal - permissive hypertension first 24 h < 220/110. Held home meds.  - Recommend Telemetry monitoring for arrythmia - Recommend bedside swallow screen prior to PO intake. - Stroke education booklet - Recommend PT/OT/SLP consult  ______________________________________________________________________   Thank you for the opportunity to  take part in the care of this patient. If you have any further questions, please contact the neurology consultation attending.  Signed,  [place] Pager Number [phone] _ _ _   _ __   _ __ _ _  __ __   _ __   __ _

## 2020-08-06 NOTE — H&P (Signed)
History and Physical    Joy Williams HWE:[id number] DOB: [birth year] DOA: 08/06/2020  PCP: Lorrene Reid, PA-C Patient coming from: Home  Chief Complaint: Dizziness, left arm incoordination, headache  HPI: Joy Williams is a 69 y.o. female with medical history significant of chronic diastolic heart failure, bilateral carotid artery stenosis, CAD, COPD, scleroderma, cold agglutinin disease, Raynaud's syndrome, telangiectasia, severe mitral regurgitation status post bioprosthetic MVR in 2018, hypothyroidism, COPD, history of lung cancer, history of TIA presented to the ED for evaluation of acute onset dizziness, left arm incoordination, and headache.  Patient states this afternoon around 3:30 PM she was outside with her granddaughter and all of a sudden felt very dizzy and unsteady.  States her left arm was numb and moving when she did not want it to move.  It felt like she could not control her arm.  She also had a severe headache and nausea at that time.  She had tightness in her chest.  Her granddaughter helped her sit on a chair and symptoms resolved in about 25 to 30 minutes except she was still having a headache when she came to the emergency room but it has now resolved after receiving Dilaudid and magnesium. She is fully vaccinated against COVID including booster shot.  Reports having a chronic cough due to allergies, no recent change.  Denies fevers, vomiting, abdominal pain, or diarrhea.  States she was previously smoking heavily but cut down on cigarette smoking after she had lung cancer in 2016.  At present she continues to smoke 4 to 5 cigarettes a day.  ED Course: Patient symptoms had resolved at the time of arrival to the ED. vital signs stable.  Labs showing WBC 9.9, hemoglobin 11.4, hematocrit 35.6, MCV 87.3, platelet count 212K.  Sodium 135, potassium 4.0, chloride 102, bicarb 27, BUN 14, creatinine 0.7, glucose 104.  Blood ethanol level pending.  Screening Covid test pending.  INR  1.0.  UDS negative.  UA without signs of infection.    CT head showing no evidence of acute intracranial hemorrhage or acute demarcated cortical infarction.  A lacunar infarct within the left basal ganglia is new compared to brain MRI done in October 2019 but otherwise age indeterminate.  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    CTA head and neck pending.  Neurology requested admission for stroke work-up.    Patient was given Dilaudid, IV magnesium 1 g, Zofran, and IV fluid in the ED.  Review of Systems:  All systems reviewed and apart from history of presenting illness, are negative.  Past Medical History:  Diagnosis Date  . Allergy   . Asthma    pt denies this, but is on Dulera  . Chronic diastolic congestive heart failure ([place])   . 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[place]) 06/26/2016  . 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 COPD (chronic obstructive pulmonary disease) ([place])   . Family history of adverse reaction to anesthesia    Mother- very sensitive to medication  . Heart murmur    MVP  . Hypothyroidism   . 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 MVP (mitral valve prolapse)   . PONV (postoperative nausea and vomiting)   .  Raynaud's disease    Raynaud's  . Raynaud's syndrome   . 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   31 mm [facility] mitral bovine bioprosthetic tissue valve placed via right mini thoracotomy approach  . Severe mitral regurgitation 11/15/2014  . Shortness of breath dyspnea    with exertion  . STD (sexually transmitted disease)   . Telangiectasia   . Thyroid disease     Past Surgical History:  Procedure Laterality Date  .  BACK SURGERY     x 3  Disectomy  . BREAST BIOPSY Left   . CARDIAC CATHETERIZATION N/A 03/08/2016   Procedure: Right/Left Heart Cath and Coronary Angiography;  Surgeon: Sherren Mocha, MD;  Location: [place] [facility];  Service: Cardiovascular;  Laterality: N/A;  . CLAVICLE SURGERY Left 2013   plate to left collar bone  . LAPAROSCOPY     ? reason-age 64   . LEFT HEART CATH AND CORONARY ANGIOGRAPHY N/A 02/25/2019   Procedure: LEFT HEART CATH AND CORONARY ANGIOGRAPHY;  Surgeon: Jettie Booze, MD;  Location: [place] [facility];  Service: Cardiovascular;  Laterality: N/A;  . LUNG CANCER SURGERY    . MITRAL VALVE REPAIR Right 06/27/2016   Procedure: MINIMALLY INVASIVE MITRAL VALVE REPLACEMENT;  Surgeon: Rexene Alberts, MD;  Location: [place];  Service: Open Heart Surgery;  Laterality: Right;  . TEE WITHOUT CARDIOVERSION N/A 02/22/2016   Procedure: TRANSESOPHAGEAL ECHOCARDIOGRAM (TEE);  Surgeon: Dorothy Spark, MD;  Location: [place];  Service: Cardiovascular;  Laterality: N/A;  . TEE WITHOUT CARDIOVERSION N/A 06/27/2016   Procedure: TRANSESOPHAGEAL ECHOCARDIOGRAM (TEE);  Surgeon: Rexene Alberts, MD;  Location: [place];  Service: Open Heart Surgery;  Laterality: N/A;  . TONSILLECTOMY    . VIDEO ASSISTED THORACOSCOPY (VATS)/WEDGE RESECTION Left 12/30/2014   Bronchoscopy, Mediastinoscopy, Left VATS for Wedge resection LUL x2 adn LLL x1 - Dr. Elenor Quinones at [facility]  . VOCAL CORD INJECTION Left 2017   injected with botox  . wrist surgery Left 2015   plate to wrist      reports that she quit smoking about a year ago. Her smoking use included e-cigarettes and cigarettes. She started smoking about 37 years ago. She has a 6.00 pack-year smoking history. She has never used smokeless tobacco. She reports current alcohol use of about 2.0 standard drinks of alcohol per week. She reports that she does not use drugs.  Allergies  Allergen Reactions  . Penicillins Hives    Unknown, occurred as a child Has  patient had a PCN reaction causing immediate rash, facial/tongue/throat swelling, SOB or lightheadedness with hypotension: Yes Has patient had a PCN reaction causing severe rash involving mucus membranes or skin necrosis: No Has patient had a PCN reaction that required hospitalization  Has patient had a PCN reaction occurring within the last 10 years: No If all of the above answers are "NO", then may proceed with Cephalosporin use. No    Family History  Problem Relation Age of Onset  . Mitral valve prolapse Mother   . Dementia Father   . Prostate cancer Father   . Mitral valve prolapse Brother   . Mitral valve prolapse Sister   . Colon cancer Neg Hx   . Colon polyps Neg Hx   . Rectal cancer Neg Hx   . Esophageal cancer Neg Hx   . Stomach cancer Neg Hx   . Heart attack Neg Hx     Prior to Admission medications   Medication Sig Start  Date End Date Taking? Authorizing Provider  acetaminophen (TYLENOL) 500 MG tablet Take 1,000 mg by mouth every 6 (six) hours as needed for mild pain or headache.   Yes [provider]  atorvastatin (LIPITOR) 20 MG tablet TAKE 1 TABLET (20 MG TOTAL) BY MOUTH DAILY AT 6 PM. 11/10/19  Yes Dorothy Spark, MD  azelastine (ASTELIN) 0.1 % nasal spray Place 2 sprays into both nostrils 2 (two) times daily. Use in each nostril as directed Patient taking differently: Place 2 sprays into both nostrils 2 (two) times daily as needed for rhinitis or allergies. 12/27/19  Yes Lorrene Reid, PA-C  clopidogrel (PLAVIX) 75 MG tablet Take 1 tablet (75 mg total) by mouth daily. Please make yearly appt with Dr. Meda Coffee for February 2022 for future refills. Thank you 1st attempt 05/23/20  Yes Dorothy Spark, MD  levothyroxine (SYNTHROID) 175 MCG tablet TAKE 1 TABLET BY MOUTH DAILY BEFORE BREAKFAST Patient taking differently: Take 175 mcg by mouth daily before breakfast. 06/16/20  Yes Abonza, Maritza, PA-C  MELATONIN PO Take 1 tablet by mouth at bedtime as needed  (sleep).   Yes [provider]  metoprolol succinate (TOPROL XL) 25 MG 24 hr tablet Take 0.5 tablets (12.5 mg total) by mouth daily. Patient taking differently: Take 12.5 mg by mouth every evening. 07/27/20  Yes Freada Bergeron, MD  Multiple Vitamins-Minerals (MULTIVITAMIN WITH MINERALS) tablet Take 1 tablet by mouth daily.   Yes [provider]  nitroGLYCERIN (NITROSTAT) 0.4 MG SL tablet Place 1 tablet (0.4 mg total) under the tongue every 5 (five) minutes as needed for chest pain. 03/01/19  Yes Weaver, Scott T, PA-C  TRELEGY ELLIPTA 100-62.5-25 MCG/INH AEPB TAKE 1 PUFF BY MOUTH EVERY DAY Patient taking differently: Inhale 2 puffs into the lungs daily. 09/13/19  Yes Tanda Rockers, MD  Turmeric 500 MG CAPS Take 500 mg by mouth daily.   Yes [provider]    Physical Exam: Vitals:   08/06/20 2015 08/06/20 2100 08/06/20 2200 08/06/20 2245  BP: (!) 113/57 (!) 118/103 133/67 128/78  Pulse: 69 78 77 77  Resp: 20 15 18 16   Temp:    97.9 F (36.6 C)  TempSrc:    Oral  SpO2: 97% 97% 95% 100%  Weight:      Height:        Physical Exam Constitutional:      General: She is not in acute distress. HENT:     Head: Normocephalic and atraumatic.  Eyes:     Extraocular Movements: Extraocular movements intact.     Pupils: Pupils are equal, round, and reactive to light.  Cardiovascular:     Rate and Rhythm: Normal rate and regular rhythm.     Pulses: Normal pulses.  Pulmonary:     Effort: Pulmonary effort is normal. No respiratory distress.     Breath sounds: Normal breath sounds.  Abdominal:     General: Bowel sounds are normal. There is no distension.     Palpations: Abdomen is soft.     Tenderness: There is no abdominal tenderness.  Musculoskeletal:        General: No swelling or tenderness.     Cervical back: Normal range of motion and neck supple.  Skin:    General: Skin is warm and dry.  Neurological:     General: No focal deficit present.      Mental Status: She is alert and oriented to person, place, and time.     Cranial Nerves:  No cranial nerve deficit.     Sensory: No sensory deficit.     Motor: No weakness.     Labs on Admission: I have personally reviewed following labs and imaging studies  CBC: Recent Labs  Lab 08/06/20 1800  WBC 9.9  NEUTROABS 8.0*  HGB 11.4*  HCT 35.6*  MCV 87.3  PLT 604   Basic Metabolic Panel: Recent Labs  Lab 08/06/20 1800  NA 135  K 4.0  CL 102  CO2 27  GLUCOSE 104*  BUN 14  CREATININE 0.72  CALCIUM 9.0   GFR: Estimated Creatinine Clearance: 64.5 mL/min (by C-G formula based on SCr of 0.72 mg/dL). Liver Function Tests: Recent Labs  Lab 08/06/20 1800  AST 29  ALT 20  ALKPHOS 64  BILITOT 0.3  PROT 6.3*  ALBUMIN 3.5   No results for input(s): LIPASE, AMYLASE in the last 168 hours. No results for input(s): AMMONIA in the last 168 hours. Coagulation Profile: Recent Labs  Lab 08/06/20 1800  INR 1.0   Cardiac Enzymes: No results for input(s): CKTOTAL, CKMB, CKMBINDEX, TROPONINI in the last 168 hours. BNP (last 3 results) No results for input(s): PROBNP in the last 8760 hours. HbA1C: No results for input(s): HGBA1C in the last 72 hours. CBG: No results for input(s): GLUCAP in the last 168 hours. Lipid Profile: No results for input(s): CHOL, HDL, LDLCALC, TRIG, CHOLHDL, LDLDIRECT in the last 72 hours. Thyroid Function Tests: No results for input(s): TSH, T4TOTAL, FREET4, T3FREE, THYROIDAB in the last 72 hours. Anemia Panel: No results for input(s): VITAMINB12, FOLATE, FERRITIN, TIBC, IRON, RETICCTPCT in the last 72 hours. Urine analysis:    Component Value Date/Time   COLORURINE YELLOW 08/06/2020 1836   APPEARANCEUR CLEAR 08/06/2020 1836   LABSPEC 1.017 08/06/2020 1836   PHURINE 6.0 08/06/2020 1836   GLUCOSEU NEGATIVE 08/06/2020 1836   HGBUR NEGATIVE 08/06/2020 1836   BILIRUBINUR NEGATIVE 08/06/2020 1836   BILIRUBINUR negative 10/14/2017 1020   KETONESUR  NEGATIVE 08/06/2020 1836   PROTEINUR NEGATIVE 08/06/2020 1836   UROBILINOGEN 0.2 10/14/2017 1020   NITRITE NEGATIVE 08/06/2020 1836   LEUKOCYTESUR NEGATIVE 08/06/2020 1836    Radiological Exams on Admission: CT HEAD WO CONTRAST  Result Date: 08/06/2020 CLINICAL DATA:  Neuro deficit, acute, stroke suspected. Additional history provided: Bilateral ear pain/pressure, headache. EXAM: CT HEAD WITHOUT CONTRAST TECHNIQUE: Contiguous axial images were obtained from the base of the skull through the vertex without intravenous contrast. COMPARISON:  Brain MRI 03/12/2018. FINDINGS: Brain: Cerebral volume is normal for age. Lacunar infarct within the left basal ganglia, new as compared to the brain MRI of 03/12/2018 but otherwise age indeterminate (series 3, image 13) (series 5, image 24). Known small infarcts within the left thalamus and left cerebellar hemisphere better appreciated on the prior brain MRI of 03/12/2018. There is no acute intracranial hemorrhage. No demarcated cortical infarct. No extra-axial fluid collection. No evidence of intracranial mass. No midline shift. Vascular: No hyperdense vessel.  Atherosclerotic calcifications Skull: Normal. Negative for fracture or focal lesion. Sinuses/Orbits: Visualized orbits show no acute finding. No significant paranasal sinus disease at the imaged levels. IMPRESSION: No evidence of acute intracranial hemorrhage or acute demarcated cortical infarction. A lacunar infarct within the left basal ganglia is new as compared to the brain MRI of 03/12/2018, but otherwise age indeterminate. Known small infarcts within the left thalamus and left cerebellar hemisphere were better appreciated on the prior brain MRI of 03/12/2018. Electronically Signed   By: Kellie Simmering DO   On: 08/06/2020  18:30    EKG: Independently reviewed.  Sinus rhythm, nonspecific T wave abnormality.  No significant change since prior tracing.  Assessment/Plan Principal Problem:   TIA (transient  ischemic attack) Active Problems:   Chronic diastolic congestive heart failure (HCC)   Chronic obstructive pulmonary disease (HCC)   Hypothyroidism   Anemia   TIA versus possible acute stroke: Patient here for evaluation of acute onset dizziness, left arm incoordination, and headache which lasted about 25 to 30 minutes.  Neuro exam currently nonfocal.  CT head showing no evidence of acute intracranial hemorrhage or acute demarcated cortical infarction.  A lacunar infarct within the left basal ganglia is new compared to brain MRI done in October 2019 but otherwise age indeterminate.  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   -Telemetry monitoring -Allow for permissive hypertension up to 220/110 for the first 24 hours -Brain MRI ordered -CTA head and neck pending -TTE -Hemoglobin A1c, fasting lipid panel -Given reported headache at onset and history of telangiectasias, neurology recommending holding antiplatelet therapy for now until MRI and CT angiogram rule out bleed. -Frequent neurochecks -PT, OT, speech therapy. -N.p.o. until cleared by bedside swallow evaluation or formal speech evaluation -Neurology following, appreciate recommendations  Mild normocytic anemia: Hemoglobin 11.4, MCV 87.3. -Anemia panel  Chronic diastolic heart failure: No signs of volume overload. -Continue to monitor volume status closely, daily weights.  COPD: Stable.  No signs of acute exacerbation. -Continue home trilogy  Hypothyroidism   -Continue home Synthroid  Hyperlipidemia -Currently takes a moderate intensity statin at home, check lipid panel  CAD: Patient reports experiencing some chest tightness at the time she had neurologic symptoms this afternoon but it has now resolved.  Currently chest pain-free and appears comfortable.  EKG without acute ischemic changes. -Check high-sensitivity troponin level.   On Plavix 75 mg daily at home but neurology recommending holding antiplatelet therapy for now until MRI and CT angiogram rule out intracranial bleed.  Hold metoprolol to allow permissive hypertension.  Continue statin.  Tobacco use -NicoDerm patch and counseled to quit  DVT prophylaxis: SCDs Code Status: Patient wishes to be full code. Family Communication: No family available at this time. Disposition Plan: Status is: Observation  The patient remains OBS appropriate and will d/c before 2 midnights.  Dispo: The patient is from: Home              Anticipated d/c is to: Home              Patient currently is not medically stable to d/c.   Difficult to place patient No  Level of care: Level of care: Telemetry Medical   The medical decision making on this patient was of high complexity and the patient is at high risk for clinical deterioration, therefore this is a level 3 visit.  Shela Leff MD [facility]  If 7PM-7AM, please contact night-coverage [email or web address]  08/06/2020, 11:36 PM

## 2020-08-06 NOTE — ED Provider Notes (Signed)
2254 Spoke with Dr. Marlowe Sax who agreed to see the patient for admission.     Loni Beckwith, PA-C 08/06/20 2312    Charlesetta Shanks, MD 08/11/20 [phone]

## 2020-08-06 NOTE — ED Notes (Signed)
Labs sent, patient ready for CT

## 2020-08-06 NOTE — ED Notes (Signed)
Patient used [facility] with minimal assistance. Patient updated on plan of care, denies further needs at this time.

## 2020-08-07 ENCOUNTER — Other Ambulatory Visit: Payer: Self-pay

## 2020-08-07 ENCOUNTER — Observation Stay (HOSPITAL_BASED_OUTPATIENT_CLINIC_OR_DEPARTMENT_OTHER): Payer: Medicare Other

## 2020-08-07 ENCOUNTER — Encounter (HOSPITAL_COMMUNITY): Payer: Self-pay | Admitting: Internal Medicine

## 2020-08-07 ENCOUNTER — Observation Stay (HOSPITAL_COMMUNITY): Payer: Medicare Other

## 2020-08-07 DIAGNOSIS — I6523 Occlusion and stenosis of bilateral carotid arteries: Secondary | ICD-10-CM | POA: Diagnosis not present

## 2020-08-07 DIAGNOSIS — D5912 Cold autoimmune hemolytic anemia: Secondary | ICD-10-CM | POA: Diagnosis not present

## 2020-08-07 DIAGNOSIS — E559 Vitamin D deficiency, unspecified: Secondary | ICD-10-CM | POA: Diagnosis not present

## 2020-08-07 DIAGNOSIS — I5032 Chronic diastolic (congestive) heart failure: Secondary | ICD-10-CM | POA: Diagnosis not present

## 2020-08-07 DIAGNOSIS — D649 Anemia, unspecified: Secondary | ICD-10-CM | POA: Diagnosis not present

## 2020-08-07 DIAGNOSIS — F17210 Nicotine dependence, cigarettes, uncomplicated: Secondary | ICD-10-CM | POA: Diagnosis not present

## 2020-08-07 DIAGNOSIS — J449 Chronic obstructive pulmonary disease, unspecified: Secondary | ICD-10-CM | POA: Diagnosis not present

## 2020-08-07 DIAGNOSIS — R29818 Other symptoms and signs involving the nervous system: Secondary | ICD-10-CM | POA: Diagnosis not present

## 2020-08-07 DIAGNOSIS — Z952 Presence of prosthetic heart valve: Secondary | ICD-10-CM | POA: Diagnosis not present

## 2020-08-07 DIAGNOSIS — I6389 Other cerebral infarction: Secondary | ICD-10-CM | POA: Diagnosis not present

## 2020-08-07 DIAGNOSIS — E785 Hyperlipidemia, unspecified: Secondary | ICD-10-CM | POA: Diagnosis present

## 2020-08-07 DIAGNOSIS — I110 Hypertensive heart disease with heart failure: Secondary | ICD-10-CM | POA: Diagnosis not present

## 2020-08-07 DIAGNOSIS — Z7982 Long term (current) use of aspirin: Secondary | ICD-10-CM | POA: Diagnosis not present

## 2020-08-07 DIAGNOSIS — Z79890 Hormone replacement therapy (postmenopausal): Secondary | ICD-10-CM | POA: Diagnosis not present

## 2020-08-07 DIAGNOSIS — I214 Non-ST elevation (NSTEMI) myocardial infarction: Secondary | ICD-10-CM | POA: Diagnosis not present

## 2020-08-07 DIAGNOSIS — E039 Hypothyroidism, unspecified: Secondary | ICD-10-CM

## 2020-08-07 DIAGNOSIS — Z7902 Long term (current) use of antithrombotics/antiplatelets: Secondary | ICD-10-CM | POA: Diagnosis not present

## 2020-08-07 DIAGNOSIS — G459 Transient cerebral ischemic attack, unspecified: Secondary | ICD-10-CM | POA: Diagnosis present

## 2020-08-07 DIAGNOSIS — Z20822 Contact with and (suspected) exposure to covid-19: Secondary | ICD-10-CM | POA: Diagnosis not present

## 2020-08-07 DIAGNOSIS — Z880 Allergy status to penicillin: Secondary | ICD-10-CM | POA: Diagnosis not present

## 2020-08-07 DIAGNOSIS — Z8673 Personal history of transient ischemic attack (TIA), and cerebral infarction without residual deficits: Secondary | ICD-10-CM

## 2020-08-07 DIAGNOSIS — I7300 Raynaud's syndrome without gangrene: Secondary | ICD-10-CM | POA: Diagnosis not present

## 2020-08-07 DIAGNOSIS — I6521 Occlusion and stenosis of right carotid artery: Secondary | ICD-10-CM | POA: Diagnosis not present

## 2020-08-07 DIAGNOSIS — Z720 Tobacco use: Secondary | ICD-10-CM | POA: Diagnosis not present

## 2020-08-07 DIAGNOSIS — Z7951 Long term (current) use of inhaled steroids: Secondary | ICD-10-CM | POA: Diagnosis not present

## 2020-08-07 DIAGNOSIS — I2510 Atherosclerotic heart disease of native coronary artery without angina pectoris: Secondary | ICD-10-CM | POA: Diagnosis not present

## 2020-08-07 DIAGNOSIS — Z9221 Personal history of antineoplastic chemotherapy: Secondary | ICD-10-CM | POA: Diagnosis not present

## 2020-08-07 DIAGNOSIS — R519 Headache, unspecified: Secondary | ICD-10-CM | POA: Diagnosis not present

## 2020-08-07 DIAGNOSIS — Z85118 Personal history of other malignant neoplasm of bronchus and lung: Secondary | ICD-10-CM | POA: Diagnosis not present

## 2020-08-07 DIAGNOSIS — Z79899 Other long term (current) drug therapy: Secondary | ICD-10-CM | POA: Diagnosis not present

## 2020-08-07 LAB — FOLATE: Folate: 14.1 ng/mL (ref >5.9)

## 2020-08-07 LAB — RETICULOCYTES
Immature Retic Fract: 9.9 % (ref 2.3–15.9)
RBC.: 3.72 MIL/uL — ABNORMAL LOW (ref 3.87–5.11)
Retic Count, Absolute: 56.5 10*3/uL (ref 19.0–186.0)
Retic Ct Pct: 1.5 % (ref 0.4–3.1)

## 2020-08-07 LAB — ECHOCARDIOGRAM COMPLETE
AR max vel: 2.16 cm2
AV Area VTI: 2.07 cm2
AV Area mean vel: 2.12 cm2
AV Mean grad: 6 mmHg
AV Peak grad: 12.7 mmHg
Ao pk vel: 1.78 m/s
Area-P 1/2: 3.17 cm2
Height: 67 in
MV VTI: 2.43 cm2
S' Lateral: 2.3 cm
Weight: 2320 oz

## 2020-08-07 LAB — FERRITIN: Ferritin: 81 ng/mL (ref 11–307)

## 2020-08-07 LAB — IRON AND TIBC
Iron: 40 ug/dL (ref 28–170)
Saturation Ratios: 15 % (ref 10.4–31.8)
TIBC: 274 ug/dL (ref 250–450)
UIBC: 234 ug/dL

## 2020-08-07 LAB — RESP PANEL BY RT-PCR (FLU A&B, COVID) ARPGX2
Influenza A by PCR: NEGATIVE
Influenza B by PCR: NEGATIVE
SARS Coronavirus 2 by RT PCR: NEGATIVE

## 2020-08-07 LAB — LIPID PANEL
Cholesterol: 116 mg/dL (ref 0–200)
HDL: 46 mg/dL (ref >40)
LDL Cholesterol: 50 mg/dL (ref 0–99)
Total CHOL/HDL Ratio: 2.5 RATIO
Triglycerides: 102 mg/dL (ref <150)
VLDL: 20 mg/dL (ref 0–40)

## 2020-08-07 LAB — HIV ANTIBODY (ROUTINE TESTING W REFLEX): HIV Screen 4th Generation wRfx: NONREACTIVE

## 2020-08-07 LAB — VITAMIN B12: Vitamin B-12: 243 pg/mL (ref 180–914)

## 2020-08-07 LAB — HEMOGLOBIN A1C
Hgb A1c MFr Bld: 5.6 % (ref 4.8–5.6)
Mean Plasma Glucose: 114.02 mg/dL

## 2020-08-07 MED ORDER — ASPIRIN EC 81 MG PO TBEC
81.0000 mg | DELAYED_RELEASE_TABLET | Freq: Every day | ORAL | Status: DC
  Administered 2020-08-07 – 2020-08-10 (×3): 81 mg via ORAL
  Filled 2020-08-07 (×3): qty 1

## 2020-08-07 MED ORDER — ENOXAPARIN SODIUM 40 MG/0.4ML ~~LOC~~ SOLN
40.0000 mg | SUBCUTANEOUS | Status: DC
  Administered 2020-08-07 – 2020-08-10 (×3): 40 mg via SUBCUTANEOUS
  Filled 2020-08-07 (×3): qty 0.4

## 2020-08-07 MED ORDER — NICOTINE 7 MG/24HR TD PT24
7.0000 mg | MEDICATED_PATCH | Freq: Every day | TRANSDERMAL | Status: DC
  Administered 2020-08-07 – 2020-08-10 (×4): 7 mg via TRANSDERMAL
  Filled 2020-08-07 (×4): qty 1

## 2020-08-07 MED ORDER — CLOPIDOGREL BISULFATE 75 MG PO TABS
75.0000 mg | ORAL_TABLET | Freq: Every day | ORAL | Status: DC
  Administered 2020-08-07 – 2020-08-10 (×3): 75 mg via ORAL
  Filled 2020-08-07 (×3): qty 1

## 2020-08-07 MED ORDER — ASPIRIN 81 MG PO CHEW: 81.0000 mg | CHEWABLE_TABLET | Freq: Every day | ORAL | Status: DC

## 2020-08-07 MED ORDER — METOPROLOL SUCCINATE ER 25 MG PO TB24
12.5000 mg | ORAL_TABLET | Freq: Every evening | ORAL | Status: DC
  Administered 2020-08-07 – 2020-08-09 (×3): 12.5 mg via ORAL
  Filled 2020-08-07 (×3): qty 1

## 2020-08-07 MED ORDER — UMECLIDINIUM BROMIDE 62.5 MCG/INH IN AEPB
1.0000 | INHALATION_SPRAY | Freq: Every day | RESPIRATORY_TRACT | Status: DC
  Administered 2020-08-07 – 2020-08-09 (×3): 1 via RESPIRATORY_TRACT
  Filled 2020-08-07 (×2): qty 7

## 2020-08-07 MED ORDER — AZELASTINE HCL 0.1 % NA SOLN
2.0000 | Freq: Two times a day (BID) | NASAL | Status: DC | PRN
  Filled 2020-08-07: qty 30

## 2020-08-07 MED ORDER — FLUTICASONE FUROATE-VILANTEROL 100-25 MCG/INH IN AEPB
1.0000 | INHALATION_SPRAY | Freq: Every day | RESPIRATORY_TRACT | Status: DC
  Administered 2020-08-07 – 2020-08-09 (×3): 1 via RESPIRATORY_TRACT
  Filled 2020-08-07 (×2): qty 28

## 2020-08-07 MED ORDER — NITROGLYCERIN 0.4 MG SL SUBL: 0.4000 mg | SUBLINGUAL_TABLET | SUBLINGUAL | Status: DC | PRN

## 2020-08-07 MED ORDER — MELATONIN 3 MG PO TABS
3.0000 mg | ORAL_TABLET | Freq: Every evening | ORAL | Status: DC | PRN
  Administered 2020-08-07 – 2020-08-09 (×2): 3 mg via ORAL
  Filled 2020-08-07 (×3): qty 1

## 2020-08-07 MED ORDER — SODIUM CHLORIDE 0.9 % IV SOLN: INTRAVENOUS | Status: AC

## 2020-08-07 NOTE — Progress Notes (Signed)
OT Cancellation Note  Patient Details Name: Joy Williams MRN: [id number] DOB: Nov 02, 1950   Cancelled Treatment:    Reason Eval/Treat Not Completed: Patient at procedure or test/ unavailable (having ultrasound)  Ramond Dial, OT/L   Acute OT Clinical Specialist [place] Pager [phone] Office [phone]  08/07/2020, 2:23 PM

## 2020-08-07 NOTE — ED Notes (Signed)
Patient back from MRI.

## 2020-08-07 NOTE — Progress Notes (Addendum)
STROKE TEAM PROGRESS NOTE   INTERVAL HISTORY  Patient stated that she was standing outside watching her granddaughter yesterday when she started shaking and moving her body sided to side.  At that time, she lost coordination of her left upper extremity and had difficulty walking.  Her granddaughter gave her a chair to rest in.  Shortly after she developed an 8/10 headache.  She was able to then wobble her way inside the house.  EMS was called.  Upon arrival to the ED her dizziness and left upper extremity weakness resolved.  She was given pain medication for her headache.    She stopped taking aspirin per her PCP recommendation approximately 1 week ago.  She has been experiencing dizziness and lightheadedness when she stands up for several months.  She also states that her balance has been altered since she started having ear pain 1 month ago.  She was evaluated for this at an [facility].  Ear drops were prescribed.  The pain has diminished but she still endorses ear "fullness".  Her daughter is at the bedside. Afebrile, VSS.    Vitals:   08/07/20 1436 08/07/20 1437 08/07/20 1438 08/07/20 1441  BP:      Pulse: 70 75 77 75  Resp:      Temp:      TempSrc:      SpO2:      Weight:      Height:       CBC:  Recent Labs  Lab 08/06/20 1800  WBC 9.9  NEUTROABS 8.0*  HGB 11.4*  HCT 35.6*  MCV 87.3  PLT 941   Basic Metabolic Panel:  Recent Labs  Lab 08/06/20 1800  NA 135  K 4.0  CL 102  CO2 27  GLUCOSE 104*  BUN 14  CREATININE 0.72  CALCIUM 9.0   Lipid Panel:  Recent Labs  Lab 08/07/20 0050  CHOL 116  TRIG 102  HDL 46  CHOLHDL 2.5  VLDL 20  LDLCALC 50   HgbA1c:  Recent Labs  Lab 08/07/20 0439  HGBA1C 5.6   Urine Drug Screen:  Recent Labs  Lab 08/06/20 1836  LABOPIA NONE DETECTED  COCAINSCRNUR NONE DETECTED  LABBENZ NONE DETECTED  AMPHETMU NONE DETECTED  THCU NONE DETECTED  LABBARB NONE DETECTED    Alcohol Level No results for input(s): ETH in the  last 168 hours.  IMAGING past 24 hours CT Angio Head/Neck W or Wo Contrast  Result Date: 08/06/2020 IMPRESSION:  1. Negative CTA for large vessel occlusion.  2. Bulky calcified plaque about the proximal ICAs bilaterally, with associated stenoses of up to 65% on the right and 50% on the left.  3. Predominant fetal type origin of the PCAs with overall diminutive vertebrobasilar system. Hypoplastic right vertebral artery largely terminates in PICA.  4. Few mildly enlarged mediastinal lymph nodes with asymmetric soft tissue fullness at the left hilum and post treatment changes within the partially visualized left lung. Findings are overall similar as compared to previous CTA from 2019.  5. 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   CT HEAD WO CONTRAST  Result Date: 08/06/2020 IMPRESSION: No evidence of acute intracranial hemorrhage or acute demarcated cortical infarction. A lacunar infarct within the left basal ganglia is new as compared to the brain MRI of 03/12/2018, but otherwise age indeterminate. Known small infarcts within the left thalamus and left cerebellar hemisphere were better appreciated on the prior brain MRI of 03/12/2018.  CT Angio Neck W and/or Wo Contrast  Result Date: 08/06/2020 IMPRESSION:  1. Negative CTA for large vessel occlusion.  2. Bulky calcified plaque about the proximal ICAs bilaterally, with associated stenoses of up to 65% on the right and 50% on the left.  3. Predominant fetal type origin of the PCAs with overall diminutive vertebrobasilar system. Hypoplastic right vertebral artery largely terminates in PICA.  4. Few mildly enlarged mediastinal lymph nodes with asymmetric soft tissue fullness at the left hilum and post treatment changes within the partially visualized left lung. Findings are overall similar as compared to previous CTA from 2019.  5. 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   MR BRAIN WO CONTRAST  Result Date: 08/07/2020 IMPRESSION:  1. No acute intracranial abnormality.  2. Multiple scattered remote lacunar infarcts involving the bilateral cerebellar hemispheres, left thalamus, and right caudate.    PHYSICAL EXAM General: Laying comfortably in bed; in no acute distress.  HENT: Normal oropharynx and mucosa. Normal external appearance of ears and nose.  Neck: Supple, no pain or tenderness  CV: No JVD. No peripheral edema.  Pulmonary: Symmetric Chest rise. Normal respiratory effort.  Abdomen: Soft to touch, non-tender.  Ext: No cyanosis, edema, or deformity  Skin: Telangiectasias. Normal palpation of skin.  Musculoskeletal: Normal digits and nails by inspection. No clubbing.   Mental status/Cognition: Alert, oriented to self, place, month and year, good attention.  Speech/language: Fluent, comprehension intact, object naming intact, repetition intact.  Cranial nerves:   CN II Pupils equal and reactive to light, no VF deficits    CN III,IV,VI EOM intact, no gaze preference or deviation, no nystagmus    CN V normal sensation in V1, V2, and V3 segments bilaterally    CN VII no asymmetry, no nasolabial fold flattening    CN VIII normal hearing to speech    CN IX & X normal palatal elevation, no uvular deviation    CN XI 5/5 head turn and 5/5 shoulder shrug bilaterally    CN XII midline tongue protrusion    Motor:  Muscle bulk: normal, tone normal, pronator drift none tremor yes end movement tremors in BL arms.  ASSESSMENT/PLAN [name] is a 70 y.o. female with history of chronic diastolic CHF, COPD, raynauds syndrome, telangectasia, severe mitral regurgitation, hypothyroidism, history of lung cancer who presents with dizziness and left arm incoordination, headache.  CT headshowing no evidence of acute intracranial hemorrhage or acute demarcated cortical infarction. A lacunar infarct within the left basal ganglia is  new compared to brain MRI done in October 2019 but otherwise age indeterminate. 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     Possible TIA with symptomatic right ICA of 65% stenosis. Complicated migraine is in DDx.   Recurrent episode similar to 02/2018 of dizziness, imbalance, left arm incoordination, not in control and headache.  CT head: No acute finding.  Age indeterminate lacunar infarct within the left basal ganglia. Known small infarcts within the left thalamus and left cerebellar.  CTA head & neck: no LVO, Bulky calcified plaque about the proximal ICAs bilaterally, with associated stenoses of up to 65% on the right and 50% on the left.  MRI: No acute intracranial abnormality.  Multiple scattered remote lacunar infarcts involving the bilateral cerebellar hemispheres, left thalamus, and right caudate.  2D Echo EF 65-70%, mild mitral valve regurgitation  LDL 50  HgbA1c 5.6  VTE prophylaxis - Lovenox 40mg  daily  No antithrombotic prior to admission, now on aspirin 81 mg daily and clopidogrel 75 mg daily.   Therapy recommendations:  Home health PT, OT pending  Disposition:  pending  Bilateral ICA stenosis  02/2018 CTA neck right ICA heavily calcified plaque with high grade stenosis but with streak artifact. Left ICA heavily calcified plaque without significant stenosis  02/2018 CUS b/l 1-39% stenosis  This admission CTA neck: right ICA of 65% stenosis and left ICA 50% stenosis  Vascularsurgery consulted - appreciate recs  On aspirin 81mg  and Plavix 75mg   Hx of TIA vs. Complicated migarine  [id number] admitted for episode of dizziness, imbalance gait, left arm weakness numbness and headache.  Status post TPA.  CT head left caudate head hypodensity.  CT head and neck bilateral ICA heavily calcified plaque high-grade stenosis of right ICA, but carotid Doppler unremarkable.  MRI no  acute infarct.  EF 55-60%.  LDL 108, A1c 5.3.  Concerning for TIA versus complicated migraine, discharged on DAPT for 3 weeks as well as statin.  Hypertension  Home meds: metoprolol 12.5mg  dialy  Stable . Permissive hypertension (OK if < 220/120) but gradually normalize in 2-3 days . Avoid hypotension . PT evaluated for orthostatic hypotension: lying 120/62, sitting 111/65, standing 108/62, standing at 3 minutes 117/58 . Long-term BP goal normotensive  Hyperlipidemia  Home meds:  Atorvastatin 20mg  daily, resumed in hospital  LDL 50, goal < 70  Continue statin at discharge  Other Stroke Risk Factors  Advanced Age >/= 4   Cigarette smoker advised to stop smoking  ETOH use, alcohol level <10, advised to drink no more than 1-2 drink(s) a day  Hx stroke: MRI revealed  Multiple scattered remote lacunar infarcts involving the bilateral cerebellar hemispheres, left thalamus, and right caudate.   Coronary artery disease  Severe mitral regurgitation s/p bioprosthetic MVR in 2018  CHF: Chronic diastolic heart failure  Other Active Problems  Hypothyroidism: synthroid 175 mcg daily  History of lung cancer 2016, s/p resection and chemotherapy. Treatment at [facility]  Tinnitus  Recent Ear pain and fullness: ear drops   Hospital day # 0  ATTENDING NOTE: I reviewed above note and agree with the assessment and plan. Pt was seen and examined.   70 year old female with history of CHF, lung cancer status post resection and chemotherapy, MVP status post bioprosthetic valve replacement, Raynaud's, telangiectasiapresenting withdizziness, L arm incoordination, gait imbalance and headache.  Current episode almost identical to the episode in 02/2018 when she received TPA.  Symptoms resolved 30 minutes, and headache lasted hours.  CT no acute abnormality, chronic lacunar infarct left BG.  CT head and neck again heavy calcific plaque lateral ICA with right ICA 65% stenosis and left 50% stenosis.   MRI negative for acute infarct.  EF 65 to 70%.  A1c 5.6, LDL 50.  UDS negative.  On exam, patient daughter at bedside.  Patient neurologically intact at this time no focal deficit.  Etiology for patient's symptoms not quite clear, although complicated migraine in the differential diagnosis, given her left arm symptoms and the right carotid stenosis with bulky plaque at carotid bulb, recommend vascular surgery consultation to consider possible right CEA.  Currently on aspirin Plavix and Lipitor.  Will follow  For detailed assessment and plan, please refer to above as I have made changes wherever appropriate.   Rosalin Hawking, MD PhD Stroke Neurology 08/07/2020 6:07 PM  I discussed with Dr. Grandville Silos and [name]. I had  long discussion with patient and daughter at bedside, updated pt current condition, treatment plan and potential prognosis, and answered all the questions.  They expressed understanding and appreciation. I spent  35 minutes in total face-to-face time with the patient, more than 50% of which was spent in counseling and coordination of care, reviewing test results, images and medication, and discussing the diagnosis, treatment plan and potential prognosis. This patient's care requiresreview of multiple databases, neurological assessment, discussion with family, other specialists and medical decision making of high complexity.    To contact Stroke Continuity provider, please refer to [email or web address] After hours, contact General Neurology

## 2020-08-07 NOTE — ED Notes (Signed)
Doctor with pt. Told me to wait 5 mins

## 2020-08-07 NOTE — ED Notes (Signed)
Patient used [facility], required minimal assistance.

## 2020-08-07 NOTE — Progress Notes (Signed)
PROGRESS NOTE    Joy Williams  CWC:[id number] DOB: 1950/07/07 DOA: 08/06/2020 PCP: Lorrene Reid, PA-C    Chief Complaint  Patient presents with  . Dizziness    Patient at home when symptoms of loss of coordination developed. Patient stated to have numbness/weakness on the left side. Symptoms resolved when EMS arrived. Patient still has complaints of headache    Brief Narrative:  Patient is a 70 year old female history of chronic diastolic CHF, COPD, renal syndrome, telangiectasias, severe mitral regurg, hypothyroidism presented with dizziness and left arm incoordination in addition to some gait abnormalities.  Patient also noted headache on presentation.  Patient noted to have been on aspirin at home which she stopped about a week ago as she states she was instructed to do so per her cardiologist. Patient also noted to endorse some lightheadedness sometimes when going from seated to standing position. -Head CT done negative for any acute intracranial hemorrhage or acute demarcated cortical infarct, lacunar infarct within the left basal ganglia new compared to brain MRI October 2019 but otherwise age-indeterminate.  MRI brain with known small infarct within the left thalamus and left cerebellar hemisphere.  Neurology consulted Patient admitted for stroke work-up.   Assessment & Plan:   Principal Problem:   TIA (transient ischemic attack) Active Problems:   Chronic diastolic congestive heart failure (HCC)   Chronic obstructive pulmonary disease (HCC)   Hypothyroidism   Anemia  #1 complicated migraine versus TIA versus symptomatic carotid artery disease -Patient had presented with evaluation of sudden onset dizziness, left upper extremity incoordination, headache, gait abnormalities which lasted about 25 to 30 minutes and subsequently resolved. -Head CT done negative for any acute abnormalities. -MRI brain negative for any acute abnormalities, multiple scattered remote lacunar  infarcts involving bilateral cerebellar hemispheres, left thalamus, right caudate. 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 -2D echo pending -Continue aspirin, Plavix, statin. -Risk factor modification. -Neurology consulted and following. -Vascular surgery consulted for further evaluation.  2.  Chronic diastolic heart failure -Stable.  Compensated.  Continue home regimen Toprol-XL. -Monitor volume status closely.  3.  COPD Stable.  Home trelegy.  4.  Hypothyroidism Continue home regimen Synthroid.  5.  Tobacco abuse Tobacco cessation.  Nicotine patch.  6.  Hyperlipidemia Continue statin.  7.  Coronary artery disease -Patient noted to have reported some chest tightness at the same time as a neurological symptoms which has since resolved. -EKG with no ischemic changes noted. -Continue aspirin, Plavix, Toprol-XL statin -Outpatient follow-up.   DVT prophylaxis: Lovenox Code Status: Full Family Communication: Updated patient and daughter at bedside. Disposition:   Status is:     Dispo: The patient is from: Home              Anticipated d/c is to: Home              Patient currently undergoing stroke work-up.  Not stable for discharge   Difficult to place patient no       Consultants:   Neurology: Dr.Khaliqdina 08/06/2020  Vascular surgery pending  Procedures:  CT head 08/06/2020  CT angiogram head and neck 08/06/2020  MRI brain 08/06/2020  2D echo pending 08/07/2020  Antimicrobials:   None   Subjective: Patient laying on gurney in the ED.  Denies any chest pain.  No  shortness of breath.  Denies any numbness or tingling.  Denies any's weakness on the left side.  Denies any gait abnormalities at this time.  Denies any headaches.  Feeling better than  on admission.  Feels like she is back to her baseline.  Daughter at bedside.  Objective: Vitals:   08/07/20 0302 08/07/20 0400 08/07/20 0600 08/07/20 0800  BP: (!) 122/56 (!) 106/46 (!) 104/54 126/69  Pulse: 76 75 67 72  Resp: 18 17 (!) 24 18  Temp:      TempSrc:      SpO2: 98% 98% 97% 98%  Weight:      Height:       No intake or output data in the 24 hours ending 08/07/20 1045 Filed Weights   08/06/20 1655  Weight: 65.8 kg    Examination:  General exam: Appears calm and comfortable  Respiratory system: Clear to auscultation. Respiratory effort normal. Cardiovascular system: S1 & S2 heard, RRR. No JVD, murmurs, rubs, gallops or clicks. No pedal edema. Gastrointestinal system: Abdomen is nondistended, soft and nontender. No organomegaly or masses felt. Normal bowel sounds heard. Central nervous system: Alert and oriented.  Cranial nerves II through XII grossly intact.  Sensation intact.  5/5 bilateral upper extremity strength,  5/5 bilateral lower extremity strength.  No focal neurological deficits. Extremities: Symmetric 5 x 5 power. Skin: No rashes, lesions or ulcers Psychiatry: Judgement and insight appear normal. Mood & affect appropriate.     Data Reviewed: I have personally reviewed following labs and imaging studies  CBC: Recent Labs  Lab 08/06/20 1800  WBC 9.9  NEUTROABS 8.0*  HGB 11.4*  HCT 35.6*  MCV 87.3  PLT 161    Basic Metabolic Panel: Recent Labs  Lab 08/06/20 1800  NA 135  K 4.0  CL 102  CO2 27  GLUCOSE 104*  BUN 14  CREATININE 0.72  CALCIUM 9.0    GFR: Estimated Creatinine Clearance: 64.5 mL/min (by C-G formula based on SCr of 0.72 mg/dL).  Liver Function Tests: Recent Labs  Lab 08/06/20 1800  AST 29  ALT 20  ALKPHOS 64  BILITOT 0.3  PROT 6.3*  ALBUMIN 3.5    CBG: No results for input(s): GLUCAP in the last 168 hours.   No results found for this or any previous visit (from the past 240 hour(s)).       Radiology  Studies: CT Angio Head W or Wo Contrast  Result Date: 08/06/2020 CLINICAL DATA:  Initial evaluation for acute onset dizziness. EXAM: CT ANGIOGRAPHY HEAD AND NECK TECHNIQUE: Multidetector CT imaging of the head and neck was performed using the standard protocol during bolus administration of intravenous contrast. Multiplanar CT image reconstructions and MIPs were obtained to evaluate the vascular anatomy. Carotid stenosis measurements (when applicable) are obtained utilizing NASCET criteria, using the distal internal carotid diameter as the denominator. CONTRAST:  11mL OMNIPAQUE IOHEXOL 350 MG/ML SOLN COMPARISON:  Prior head CT from earlier the same day. Comparison made with prior CTA from 03/11/2018 as well. FINDINGS: CTA NECK FINDINGS Aortic arch: Visualized aortic arch normal in caliber with normal 3 vessel morphology. Mild-to-moderate atheromatous change about the arch and origin of the great vessels without hemodynamically significant stenosis. Visualized subclavian arteries widely patent. Right carotid system: Right CCA patent from its origin to the bifurcation without stenosis. Bulky calcified plaque about the proximal right ICA with associated stenosis of up to 65% by NASCET criteria. Streak artifact partially obscures this region. Right ICA patent distally to the skull base.  Left carotid system: Left CCA patent from its origin to the bifurcation without stenosis. Bulky calcified plaque about the left carotid bulb/proximal left ICA with associated stenosis of up to 50% by NASCET criteria. Left ICA patent distally to the skull base. Vertebral arteries: Both vertebral arteries arise from the subclavian arteries. Strongly dominant left vertebral artery with a diffusely hypoplastic right vertebral artery. No significant proximal subclavian artery stenosis. Vertebral arteries widely patent within the neck without stenosis, dissection or occlusion. Skeleton: No visible acute osseous finding. No discrete or  worrisome osseous lesions. Degenerative grade 1 anterolisthesis of C3 on C4 and C4 on C5, with advanced degenerative spondylosis at C5-6 and C6-7. Other neck: No other acute soft tissue abnormality within neck. No mass or adenopathy. 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 Associated asymmetric sclerosis and medialization of the left arytenoid cartilage. No visible discrete lesion. Upper chest: Postsurgical changes present at the partially visualized left lung. 1.2 cm prevascular node noted. Additional 1.3 cm precarinal node. Asymmetric soft tissue fullness within the visualized left hilum. Review of the MIP images confirms the above findings CTA HEAD FINDINGS Anterior circulation: Petrous segments patent bilaterally. Mild atheromatous change within the carotid siphons without significant stenosis. A1 segments patent bilaterally. Normal anterior communicating artery complex. Anterior cerebral arteries patent to their distal aspects without stenosis. No M1 stenosis or occlusion. Normal MCA bifurcations. Distal MCA branches well perfused and symmetric. Posterior circulation: Dominant left vertebral artery patent to the vertebrobasilar junction without stenosis. Hypoplastic right vertebral artery largely terminates in PICA, although a tiny branch ascending towards the vertebrobasilar junction. Left PICA not visualized. Basilar diminutive but widely patent to its distal aspect. Dominant left AICA. Superior cerebellar arteries patent bilaterally. Predominant fetal type origin of both PCAs. PCAs well perfused and patent to their distal aspects. Venous sinuses: Patent. Anatomic variants: Predominant fetal type origin of the PCAs. Hypoplastic right vertebral artery largely terminates in PICA. No aneurysm. Review of the MIP images confirms the above findings IMPRESSION: 1. Negative CTA for large vessel occlusion. 2. Bulky calcified plaque about the proximal ICAs  bilaterally, with associated stenoses of up to 65% on the right and 50% on the left. 3. Predominant fetal type origin of the PCAs with overall diminutive vertebrobasilar system. Hypoplastic right vertebral artery largely terminates in PICA. 4. Few mildly enlarged mediastinal lymph nodes with asymmetric soft tissue fullness at the left hilum and post treatment changes within the partially visualized left lung. Findings are overall similar as compared to previous CTA from 2019. 5. 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 Electronically Signed   By: Jeannine Boga M.D.   On: 08/06/2020 23:36   CT HEAD WO CONTRAST  Result Date: 08/06/2020 CLINICAL DATA:  Neuro deficit, acute, stroke suspected. Additional history provided: Bilateral ear pain/pressure, headache. EXAM: CT HEAD WITHOUT CONTRAST TECHNIQUE: Contiguous axial images were obtained from the base of the skull through the vertex without intravenous contrast. COMPARISON:  Brain MRI 03/12/2018. FINDINGS: Brain: Cerebral volume is normal for age. Lacunar infarct within the left basal ganglia, new as compared to the brain MRI of 03/12/2018 but otherwise age indeterminate (series 3, image 13) (series 5, image 24). Known small infarcts within the left thalamus and left cerebellar hemisphere better appreciated on the prior brain MRI of 03/12/2018. There is no acute intracranial hemorrhage. No demarcated cortical infarct. No extra-axial fluid collection. No evidence of intracranial mass. No midline shift. Vascular:  No hyperdense vessel.  Atherosclerotic calcifications Skull: Normal. Negative for fracture or focal lesion. Sinuses/Orbits: Visualized orbits show no acute finding. No significant paranasal sinus disease at the imaged levels. IMPRESSION: No evidence of acute intracranial hemorrhage or acute demarcated cortical infarction. A lacunar infarct within the left basal ganglia is new as compared to the brain  MRI of 03/12/2018, but otherwise age indeterminate. Known small infarcts within the left thalamus and left cerebellar hemisphere were better appreciated on the prior brain MRI of 03/12/2018. Electronically Signed   By: Kellie Simmering DO   On: 08/06/2020 18:30   CT Angio Neck W and/or Wo Contrast  Result Date: 08/06/2020 CLINICAL DATA:  Initial evaluation for acute onset dizziness. EXAM: CT ANGIOGRAPHY HEAD AND NECK TECHNIQUE: Multidetector CT imaging of the head and neck was performed using the standard protocol during bolus administration of intravenous contrast. Multiplanar CT image reconstructions and MIPs were obtained to evaluate the vascular anatomy. Carotid stenosis measurements (when applicable) are obtained utilizing NASCET criteria, using the distal internal carotid diameter as the denominator. CONTRAST:  32mL OMNIPAQUE IOHEXOL 350 MG/ML SOLN COMPARISON:  Prior head CT from earlier the same day. Comparison made with prior CTA from 03/11/2018 as well. FINDINGS: CTA NECK FINDINGS Aortic arch: Visualized aortic arch normal in caliber with normal 3 vessel morphology. Mild-to-moderate atheromatous change about the arch and origin of the great vessels without hemodynamically significant stenosis. Visualized subclavian arteries widely patent. Right carotid system: Right CCA patent from its origin to the bifurcation without stenosis. Bulky calcified plaque about the proximal right ICA with associated stenosis of up to 65% by NASCET criteria. Streak artifact partially obscures this region. Right ICA patent distally to the skull base. Left carotid system: Left CCA patent from its origin to the bifurcation without stenosis. Bulky calcified plaque about the left carotid bulb/proximal left ICA with associated stenosis of up to 50% by NASCET criteria. Left ICA patent distally to the skull base. Vertebral arteries: Both vertebral arteries arise from the subclavian arteries. Strongly dominant left vertebral artery with  a diffusely hypoplastic right vertebral artery. No significant proximal subclavian artery stenosis. Vertebral arteries widely patent within the neck without stenosis, dissection or occlusion. Skeleton: No visible acute osseous finding. No discrete or worrisome osseous lesions. Degenerative grade 1 anterolisthesis of C3 on C4 and C4 on C5, with advanced degenerative spondylosis at C5-6 and C6-7. Other neck: No other acute soft tissue abnormality within neck. No mass or adenopathy. 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 Associated asymmetric sclerosis and medialization of the left arytenoid cartilage. No visible discrete lesion. Upper chest: Postsurgical changes present at the partially visualized left lung. 1.2 cm prevascular node noted. Additional 1.3 cm precarinal node. Asymmetric soft tissue fullness within the visualized left hilum. Review of the MIP images confirms the above findings CTA HEAD FINDINGS Anterior circulation: Petrous segments patent bilaterally. Mild atheromatous change within the carotid siphons without significant stenosis. A1 segments patent bilaterally. Normal anterior communicating artery complex. Anterior cerebral arteries patent to their distal aspects without stenosis. No M1 stenosis or occlusion. Normal MCA bifurcations. Distal MCA branches well perfused and symmetric. Posterior circulation: Dominant left vertebral artery patent to the vertebrobasilar junction without stenosis. Hypoplastic right vertebral artery largely terminates in PICA, although a tiny branch ascending towards the vertebrobasilar junction. Left PICA not visualized. Basilar diminutive but widely patent to its distal aspect. Dominant left AICA. Superior cerebellar arteries patent bilaterally. Predominant fetal type origin of both PCAs.  PCAs well perfused and patent to their distal aspects. Venous sinuses: Patent. Anatomic variants: Predominant fetal type origin of the  PCAs. Hypoplastic right vertebral artery largely terminates in PICA. No aneurysm. Review of the MIP images confirms the above findings IMPRESSION: 1. Negative CTA for large vessel occlusion. 2. Bulky calcified plaque about the proximal ICAs bilaterally, with associated stenoses of up to 65% on the right and 50% on the left. 3. Predominant fetal type origin of the PCAs with overall diminutive vertebrobasilar system. Hypoplastic right vertebral artery largely terminates in PICA. 4. Few mildly enlarged mediastinal lymph nodes with asymmetric soft tissue fullness at the left hilum and post treatment changes within the partially visualized left lung. Findings are overall similar as compared to previous CTA from 2019. 5. 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 Electronically Signed   By: Jeannine Boga M.D.   On: 08/06/2020 23:36   MR BRAIN WO CONTRAST  Result Date: 08/07/2020 CLINICAL DATA:  Initial evaluation for neuro deficit, stroke suspected. EXAM: MRI HEAD WITHOUT CONTRAST TECHNIQUE: Multiplanar, multiecho pulse sequences of the brain and surrounding structures were obtained without intravenous contrast. COMPARISON:  Prior CTs from 08/06/2020 as well as previous MRI from 03/12/2018. FINDINGS: Brain: Cerebral volume within normal limits for age. Multiple scattered remote lacunar type infarcts seen about the bilateral cerebellar hemispheres and left thalamus. Additional tiny remote lacunar infarct at the right caudate. Overall, appearance has progressed as compared to previous MRI from 2019. No abnormal foci of restricted diffusion to suggest acute or subacute ischemia. Gray-white matter differentiation otherwise maintained. No areas of remote cortical infarction. No acute intracranial hemorrhage. Few punctate foci of susceptibility artifact noted, consistent with small chronic micro hemorrhages, suspected to be hypertensive in nature. No mass lesion,  midline shift or mass effect. No hydrocephalus or extra-axial fluid collection. Pituitary gland and suprasellar region normal. Midline structures intact. Vascular: Major intracranial vascular flow voids are maintained. Skull and upper cervical spine: Craniocervical junction within normal limits. Bone marrow signal intensity normal. No scalp soft tissue abnormality. Sinuses/Orbits: Globes and orbital soft tissues demonstrate no acute finding. Paranasal sinuses are largely clear. No mastoid effusion. Inner ear structures grossly normal. Other: None. IMPRESSION: 1. No acute intracranial abnormality. 2. Multiple scattered remote lacunar infarcts involving the bilateral cerebellar hemispheres, left thalamus, and right caudate. Overall, appearance has progressed as compared to previous MRI from 2019. Electronically Signed   By: Jeannine Boga M.D.   On: 08/07/2020 02:53        Scheduled Meds: . aspirin EC  81 mg Oral Daily  . atorvastatin  20 mg Oral q1800  . clopidogrel  75 mg Oral Daily  . enoxaparin (LOVENOX) injection  40 mg Subcutaneous Q24H  . fluticasone furoate-vilanterol  1 puff Inhalation Daily   And  . umeclidinium bromide  1 puff Inhalation Daily  . levothyroxine  175 mcg Oral QAC breakfast  . nicotine  7 mg Transdermal Daily   Continuous Infusions: . sodium chloride       LOS: 0 days    Time spent: 40 minutes    Irine Seal, MD [facility]   To contact the attending provider between 7A-7P or the covering provider during after hours 7P-7A, please log into the web site [email or web address] and access using universal [place] password for that web site. If you do not have the password, please call the hospital operator.  08/07/2020, 10:45 AM

## 2020-08-07 NOTE — Evaluation (Signed)
Physical Therapy Evaluation Patient Details Name: Joy Williams MRN: [id number] DOB: [birth year] Today's Date: 08/07/2020   History of Present Illness  70 y.o. female with medical history significant of chronic diastolic heart failure, bilateral carotid artery stenosis, CAD, COPD, scleroderma, cold agglutinin disease, Raynaud's syndrome, telangiectasia, severe mitral regurgitation status post bioprosthetic MVR in 2018, hypothyroidism, COPD, history of lung cancer, history of TIA presented to the ED for evaluation of acute onset dizziness, left arm incoordination, and headache.  Clinical Impression  Pt was seen for mobility on side of bed and in room, and then looked at static and dynamic balance skills.  Her struggle with narrow based skills requires a hands on catching of balance for her, but is able to walk with supervised assistance for lines and safety of confined space.  Follow acutely for progression of mobility per goals of PT.    Follow Up Recommendations Home health PT;Supervision for mobility/OOB    Equipment Recommendations  None recommended by PT    Recommendations for Other Services       Precautions / Restrictions Precautions Precautions: Fall Precaution Comments: balance changes with berg skills Restrictions Weight Bearing Restrictions: No Other Position/Activity Restrictions: strength and balance changes on RLE      Mobility  Bed Mobility Overal bed mobility: Modified Independent                  Transfers Overall transfer level: Needs assistance Equipment used: 1 person hand held assist Transfers: Sit to/from Stand Sit to Stand: Min guard         General transfer comment: min guard for safety  Ambulation/Gait Ambulation/Gait assistance: Min guard Gait Distance (Feet): 20 Feet Assistive device: 1 person hand held assist Gait Pattern/deviations: Step-through pattern;Decreased stride length;Wide base of support;Decreased weight shift to  right Gait velocity: reduced Gait velocity interpretation: <1.31 ft/sec, indicative of household ambulator General Gait Details: Gait with HHA to steady and control lines, pt is able to walk with min guard for safety  Stairs            Wheelchair Mobility    Modified Rankin (Stroke Patients Only)       Balance Overall balance assessment: Needs assistance Sitting-balance support: Feet supported Sitting balance-Leahy Scale: Good     Standing balance support: Single extremity supported;During functional activity Standing balance-Leahy Scale: Fair                               Pertinent Vitals/Pain Pain Assessment: No/denies pain    Home Living Family/patient expects to be discharged to:: Private residence Living Arrangements: Alone Available Help at Discharge: Family;Friend(s);Available PRN/intermittently Type of Home: House       Home Layout: One level Home Equipment: None Additional Comments: home walking independently    Prior Function Level of Independence: Independent         Comments: no AD used to walk     Hand Dominance   Dominant Hand: Right    Extremity/Trunk Assessment   Upper Extremity Assessment Upper Extremity Assessment: Overall WFL for tasks assessed    Lower Extremity Assessment Lower Extremity Assessment: RLE deficits/detail RLE Deficits / Details: minor RLE weakness may be pre-existing RLE Coordination: decreased gross motor    Cervical / Trunk Assessment Cervical / Trunk Assessment: Normal  Communication   Communication: No difficulties  Cognition Arousal/Alertness: Awake/alert Behavior During Therapy: WFL for tasks assessed/performed Overall Cognitive Status: Within Functional Limits for tasks assessed  General Comments: pt is following instructions well      General Comments General comments (skin integrity, edema, etc.): Pt was seen for mobility on side of  bed with reduction in balance with high level balance skills.  Her strength was slightly less on RLE despite UE symptoms from home.  Moderate incoordination on RLE as well, and could not do the tandem stand or SLS on Berg without physical assist.    Exercises     Assessment/Plan    PT Assessment Patient needs continued PT services  PT Problem List Decreased strength;Decreased activity tolerance;Decreased balance;Decreased coordination;Decreased knowledge of use of DME;Decreased safety awareness;Cardiopulmonary status limiting activity       PT Treatment Interventions DME instruction;Gait training;Stair training;Functional mobility training;Therapeutic activities;Therapeutic exercise;Balance training;Neuromuscular re-education;Patient/family education    PT Goals (Current goals can be found in the Care Plan section)  Acute Rehab PT Goals Patient Stated Goal: to get a bit stronger PT Goal Formulation: With patient Time For Goal Achievement: 08/21/20 Potential to Achieve Goals: Good    Frequency Min 4X/week   Barriers to discharge Inaccessible home environment;Decreased caregiver support home with family not there, needs to be independent    Co-evaluation               AM-PAC PT "6 Clicks" Mobility  Outcome Measure Help needed turning from your back to your side while in a flat bed without using bedrails?: A Little Help needed moving from lying on your back to sitting on the side of a flat bed without using bedrails?: A Little Help needed moving to and from a bed to a chair (including a wheelchair)?: A Little Help needed standing up from a chair using your arms (e.g., wheelchair or bedside chair)?: A Little Help needed to walk in hospital room?: A Little Help needed climbing 3-5 steps with a railing? : A Little 6 Click Score: 18    End of Session Equipment Utilized During Treatment: Gait belt Activity Tolerance: Patient tolerated treatment well;Patient limited by  fatigue;Treatment limited secondary to medical complications (Comment) Patient left: in bed;with call bell/phone within reach Nurse Communication: Mobility status PT Visit Diagnosis: Unsteadiness on feet (R26.81);Muscle weakness (generalized) (M62.81)    Time: 7035-0093 PT Time Calculation (min) (ACUTE ONLY): 29 min   Charges:   PT Evaluation $PT Eval Moderate Complexity: 1 Mod PT Treatments $Neuromuscular Re-education: 8-22 mins       Ramond Dial 08/07/2020, 5:02 PM Mee Hives, PT MS Acute Rehab Dept. Number: [place]

## 2020-08-07 NOTE — Progress Notes (Signed)
  Echocardiogram 2D Echocardiogram has been performed.  Merrie Roof F 08/07/2020, 2:51 PM

## 2020-08-07 NOTE — ED Notes (Signed)
Covid swab resent as one from yesterday is not resulted

## 2020-08-07 NOTE — ED Notes (Signed)
Doctor is still with pt

## 2020-08-07 NOTE — ED Notes (Signed)
Patient to MRI.

## 2020-08-07 NOTE — ED Notes (Signed)
Pt settled into room 40. Denies any complaints at this time.

## 2020-08-07 NOTE — Consult Note (Addendum)
CONSULT NOTE   MRN : [id number]  Reason for Consult: Stroke symptoms with carotid stenosis right ICA with associated stenosis of up to 65% Referring Physician: Dr. Marlowe Sax  History of Present Illness: 70 y/o female with acute onset of dizziness, left are weakness and HA.  Her symptoms resolved by the time she arrived in the ED.  She states her symptoms were the same in 2019 when she had her first TIA.      CT head showing no evidence of acute intracranial hemorrhage or acute demarcated cortical infarction.  A lacunar infarct within the left basal ganglia is new compared to brain MRI done in October 2019 but otherwise age indeterminate.  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    Past medical history:chronic diastolic heart failure, bilateral carotid artery stenosis, CAD, COPD, scleroderma, cold agglutinin disease, Raynaud's syndrome, telangiectasia, severe mitral regurgitation status post bioprosthetic MVR in 2018, hypothyroidism, COPD, history of lung cancer.   Current Facility-Administered Medications  Medication Dose Route Frequency Provider Last Rate Last Admin  . 0.9 %  sodium chloride infusion   Intravenous Continuous Eugenie Filler, MD      . acetaminophen (TYLENOL) tablet 650 mg  650 mg Oral Q4H PRN Shela Leff, MD       Or  . acetaminophen (TYLENOL) 160 MG/5ML solution 650 mg  650 mg Per Tube Q4H PRN Shela Leff, MD       Or  . acetaminophen (TYLENOL) suppository 650 mg  650 mg Rectal Q4H PRN Shela Leff, MD      . aspirin EC tablet 81 mg  81 mg Oral Daily Rosalin Hawking, MD   81 mg at 08/07/20 1007  . atorvastatin (LIPITOR) tablet 20 mg  20 mg Oral q1800 Shela Leff, MD      . azelastine (ASTELIN) 0.1 % nasal spray 2 spray  2 spray Each Nare BID PRN Eugenie Filler, MD      . clopidogrel (PLAVIX) tablet 75 mg  75 mg Oral Daily Shela Leff, MD    75 mg at 08/07/20 1008  . enoxaparin (LOVENOX) injection 40 mg  40 mg Subcutaneous Q24H Shela Leff, MD      . fluticasone furoate-vilanterol (BREO ELLIPTA) 100-25 MCG/INH 1 puff  1 puff Inhalation Daily Eugenie Filler, MD   1 puff at 08/07/20 1012   And  . umeclidinium bromide (INCRUSE ELLIPTA) 62.5 MCG/INH 1 puff  1 puff Inhalation Daily Eugenie Filler, MD   1 puff at 08/07/20 1012  . levothyroxine (SYNTHROID) tablet 175 mcg  175 mcg Oral QAC breakfast Shela Leff, MD   175 mcg at 08/07/20 0651  . melatonin tablet 3 mg  3 mg Oral QHS PRN Eugenie Filler, MD      . metoprolol succinate (TOPROL-XL) 24 hr tablet 12.5 mg  12.5 mg Oral QPM Eugenie Filler, MD      . nicotine (NICODERM CQ - dosed in mg/24 hr) patch 7 mg  7 mg Transdermal Daily Shela Leff, MD   7 mg at 08/07/20 1009  . nitroGLYCERIN (NITROSTAT) SL tablet 0.4 mg  0.4 mg Sublingual Q5 min PRN Eugenie Filler, MD        Pt meds include: Statin :Yes Betablocker: Yes ASA: No Other anticoagulants/antiplatelets: Plavix  Past Medical History:  Diagnosis Date  . Allergy   . Asthma    pt denies this, but is on  Dulera  . Chronic diastolic congestive heart failure ([place])   . 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[place]) 06/26/2016  . 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 COPD (chronic obstructive pulmonary disease) ([place])   . Family history of adverse reaction to anesthesia    Mother- very sensitive to medication  . Heart murmur    MVP  . Hypothyroidism   . 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 MVP (mitral valve prolapse)   . PONV (postoperative nausea and vomiting)   . Raynaud's disease    Raynaud's  . Raynaud's syndrome   . 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  06/27/2016   31 mm [facility] mitral bovine bioprosthetic tissue valve placed via right mini thoracotomy approach  . Severe mitral regurgitation 11/15/2014  . Shortness of breath dyspnea    with exertion  . STD (sexually transmitted disease)   . Telangiectasia   . Thyroid disease     Past Surgical History:  Procedure Laterality Date  . BACK SURGERY     x 3  Disectomy  . BREAST BIOPSY Left   . CARDIAC CATHETERIZATION N/A 03/08/2016   Procedure: Right/Left Heart Cath and Coronary Angiography;  Surgeon: Sherren Mocha, MD;  Location: [place] [facility];  Service: Cardiovascular;  Laterality: N/A;  . CLAVICLE SURGERY Left 2013   plate to left collar bone  . LAPAROSCOPY     ? reason-age 55   . LEFT HEART CATH AND CORONARY ANGIOGRAPHY N/A 02/25/2019   Procedure: LEFT HEART CATH AND CORONARY ANGIOGRAPHY;  Surgeon: Jettie Booze, MD;  Location: [place] [facility];  Service: Cardiovascular;  Laterality: N/A;  . LUNG CANCER SURGERY    . MITRAL VALVE REPAIR Right 06/27/2016   Procedure: MINIMALLY INVASIVE MITRAL VALVE REPLACEMENT;  Surgeon: Rexene Alberts, MD;  Location: [place];  Service: Open Heart Surgery;  Laterality: Right;  . TEE WITHOUT CARDIOVERSION N/A 02/22/2016   Procedure: TRANSESOPHAGEAL ECHOCARDIOGRAM (TEE);  Surgeon: Dorothy Spark, MD;  Location: [place];  Service: Cardiovascular;  Laterality: N/A;  . TEE WITHOUT CARDIOVERSION N/A 06/27/2016   Procedure: TRANSESOPHAGEAL ECHOCARDIOGRAM (TEE);  Surgeon: Rexene Alberts, MD;  Location: [place];  Service: Open Heart Surgery;  Laterality: N/A;  . TONSILLECTOMY    . VIDEO ASSISTED THORACOSCOPY (VATS)/WEDGE RESECTION Left 12/30/2014   Bronchoscopy, Mediastinoscopy, Left VATS for Wedge resection LUL x2 adn LLL x1 - Dr. Elenor Quinones at [facility]  . VOCAL CORD INJECTION Left 2017   injected with botox  . wrist surgery Left 2015   plate to wrist     Social History Social History   Tobacco Use  . Smoking status: Former Smoker     Packs/day: 0.25    Years: 24.00    Pack years: 6.00    Types: E-cigarettes, Cigarettes    Start date: 05/21/1983    Quit date: 08/26/2019    Years since quitting: 0.9  . Smokeless tobacco: Never Used  . Tobacco comment: Patient is interested in trying nicotene gum  Vaping Use  . Vaping Use: Former  . Quit date: 05/20/2006  Substance Use Topics  . Alcohol use: Yes    Alcohol/week: 2.0 standard drinks    Types: 1 Shots of liquor, 1 Standard drinks or equivalent per week  . Drug  use: No    Family History Family History  Problem Relation Age of Onset  . Mitral valve prolapse Mother   . Dementia Father   . Prostate cancer Father   . Mitral valve prolapse Brother   . Mitral valve prolapse Sister   . Colon cancer Neg Hx   . Colon polyps Neg Hx   . Rectal cancer Neg Hx   . Esophageal cancer Neg Hx   . Stomach cancer Neg Hx   . Heart attack Neg Hx     Allergies  Allergen Reactions  . Penicillins Hives    Unknown, occurred as a child Has patient had a PCN reaction causing immediate rash, facial/tongue/throat swelling, SOB or lightheadedness with hypotension: Yes Has patient had a PCN reaction causing severe rash involving mucus membranes or skin necrosis: No Has patient had a PCN reaction that required hospitalization  Has patient had a PCN reaction occurring within the last 10 years: No If all of the above answers are "NO", then may proceed with Cephalosporin use. No     REVIEW OF SYSTEMS  General: [ ]  Weight loss, [ ]  Fever, [ ]  chills Neurologic: [x ] Dizziness, [ ]  Blackouts, [ ]  Seizure [ ]  Stroke, [ ]  "Mini stroke", [ ]  Slurred speech, [ ]  Temporary blindness; [ ]  weakness in arms or legs, [ ]  Hoarseness [ ]  Dysphagia Cardiac: [ ]  Chest pain/pressure, [ ]  Shortness of breath at rest [ ]  Shortness of breath with exertion, [ ]  Atrial fibrillation or irregular heartbeat  Vascular: [ ]  Pain in legs with walking, [ ]  Pain in legs at rest, [ ]  Pain in legs at night,  [ ]   Non-healing ulcer, [ ]  Blood clot in vein/DVT,   Pulmonary: [ ]  Home oxygen, [ ]  Productive cough, [ ]  Coughing up blood, [ ]  Asthma,  [ ]  Wheezing [ ]  COPD Musculoskeletal:  [ ]  Arthritis, [ ]  Low back pain, [ ]  Joint pain Hematologic: [ ]  Easy Bruising, [ ]  Anemia; [ ]  Hepatitis Gastrointestinal: [ ]  Blood in stool, [ ]  Gastroesophageal Reflux/heartburn, Urinary: [ ]  chronic Kidney disease, [ ]  on HD - [ ]  MWF or [ ]  TTHS, [ ]  Burning with urination, [ ]  Difficulty urinating Skin: [ ]  Rashes, [ ]  Wounds Psychological: [ ]  Anxiety, [ ]  Depression  Physical Examination Vitals:   08/07/20 0600 08/07/20 0800 08/07/20 1215 08/07/20 1327  BP: (!) 104/54 126/69 112/62 131/60  Pulse: 67 72 71 67  Resp: (!) 24 18 16 15   Temp:    98.4 F (36.9 C)  TempSrc:    Oral  SpO2: 97% 98% 99% 98%  Weight:      Height:       Body mass index is 22.71 kg/m.  General:  WDWN in NAD HENT: WNL, + HA Eyes: Pupils equal Pulmonary: normal non-labored breathing , without Rales, rhonchi,  wheezing Cardiac: RRR, without  Murmurs, rubs or gallops; No carotid bruits Abdomen: soft, NT, no masses Skin: no rashes, ulcers noted;  no Gangrene , no cellulitis; no open wounds;   Vascular Exam/Pulses:Palpable radial and pedal pulses   Musculoskeletal: no muscle wasting or atrophy; no edema  Neurologic: A&O X 3; Appropriate Affect ;  SENSATION: normal; MOTOR FUNCTION: 5/5 Symmetric Speech is fluent/normal   Significant Diagnostic Studies: CBC Lab Results  Component Value Date   WBC 9.9 08/06/2020   HGB 11.4 (L) 08/06/2020   HCT 35.6 (L) 08/06/2020   MCV 87.3 08/06/2020   PLT  212 08/06/2020    BMET    Component Value Date/Time   NA 135 08/06/2020 1800   NA 137 06/15/2020 1446   K 4.0 08/06/2020 1800   CL 102 08/06/2020 1800   CO2 27 08/06/2020 1800   GLUCOSE 104 (H) 08/06/2020 1800   BUN 14 08/06/2020 1800   BUN 12 06/15/2020 1446   CREATININE 0.72 08/06/2020 1800   CALCIUM 9.0 08/06/2020  1800   GFRNONAA >60 08/06/2020 1800   GFRAA 99 06/15/2020 1446   Estimated Creatinine Clearance: 64.5 mL/min (by C-G formula based on SCr of 0.72 mg/dL).  COAG Lab Results  Component Value Date   INR 1.0 08/06/2020   INR 1.0 02/24/2019   INR 1.00 03/11/2018     Non-Invasive Vascular Imaging:   FINDINGS: CTA NECK FINDINGS  Aortic arch: Visualized aortic arch normal in caliber with normal 3 vessel morphology. Mild-to-moderate atheromatous change about the arch and origin of the great vessels without hemodynamically significant stenosis. Visualized subclavian arteries widely patent.  Right carotid system: Right CCA patent from its origin to the bifurcation without stenosis. Bulky calcified plaque about the proximal right ICA with associated stenosis of up to 65% by NASCET criteria. Streak artifact partially obscures this region. Right ICA patent distally to the skull base.  Left carotid system: Left CCA patent from its origin to the bifurcation without stenosis. Bulky calcified plaque about the left carotid bulb/proximal left ICA with associated stenosis of up to 50% by NASCET criteria. Left ICA patent distally to the skull base.  Vertebral arteries: Both vertebral arteries arise from the subclavian arteries. Strongly dominant left vertebral artery with a diffusely hypoplastic right vertebral artery. No significant proximal subclavian artery stenosis. Vertebral arteries widely patent within the neck without stenosis, dissection or occlusion.  Skeleton: No visible acute osseous finding. No discrete or worrisome osseous lesions. Degenerative grade 1 anterolisthesis of C3 on C4 and C4 on C5, with advanced degenerative spondylosis at C5-6 and C6-7.  Other neck: No other acute soft tissue abnormality within neck. No mass or adenopathy. 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 Associated asymmetric sclerosis and  medialization of the left arytenoid cartilage. No visible discrete lesion.  Upper chest: Postsurgical changes present at the partially visualized left lung. 1.2 cm prevascular node noted. Additional 1.3 cm precarinal node. Asymmetric soft tissue fullness within the visualized left hilum.  Review of the MIP images confirms the above findings  CTA HEAD FINDINGS  Anterior circulation: Petrous segments patent bilaterally. Mild atheromatous change within the carotid siphons without significant stenosis. A1 segments patent bilaterally. Normal anterior communicating artery complex. Anterior cerebral arteries patent to their distal aspects without stenosis. No M1 stenosis or occlusion. Normal MCA bifurcations. Distal MCA branches well perfused and symmetric.  Posterior circulation: Dominant left vertebral artery patent to the vertebrobasilar junction without stenosis. Hypoplastic right vertebral artery largely terminates in PICA, although a tiny branch ascending towards the vertebrobasilar junction. Left PICA not visualized. Basilar diminutive but widely patent to its distal aspect. Dominant left AICA. Superior cerebellar arteries patent bilaterally. Predominant fetal type origin of both PCAs. PCAs well perfused and patent to their distal aspects.  Venous sinuses: Patent.  Anatomic variants: Predominant fetal type origin of the PCAs. Hypoplastic right vertebral artery largely terminates in PICA. No aneurysm.  Review of the MIP images confirms the above findings  IMPRESSION: 1. Negative CTA for large vessel occlusion. 2. Bulky calcified plaque about the proximal ICAs bilaterally, with associated  stenoses of up to 65% on the right and 50% on the left. 3. Predominant fetal type origin of the PCAs with overall diminutive vertebrobasilar system. Hypoplastic right vertebral artery largely terminates in PICA. 4. Few mildly enlarged mediastinal lymph nodes with asymmetric  soft tissue fullness at the left hilum and post treatment changes within the partially visualized left lung. Findings are overall similar as compared to previous CTA from 2019. 5. 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  She will require right CEA likely in the near future.  Work up has started and she is currently having an ECHO at bedside. Plan for intervention per [name].   Roxy Horseman 08/07/2020 1:46 PM   I have independently interviewed and examined patient and agree with PA assessment and plan above.  Appears to have significant stenosis right ICA that is heavily calcified.  I discussed with the patient her options we have elected for right carotid endarterectomy and we will tentatively plan for Wednesday and I will discuss with family.  Risk and benefits discussed with the patient today.  Brandon C. Donzetta Matters, MD [facility] Office: [phone] Pager: [phone]

## 2020-08-07 NOTE — Progress Notes (Signed)
SLP Cancellation Note  Patient Details Name: Joy Williams MRN: [id number] DOB: [birth year]   Cancelled treatment:       Reason Eval/Treat Not Completed: SLP screened, no needs identified, will sign off. Spoke with pt and dtr at bedside. No speech/communication/cognitive changes.   Amanda L. Tivis Ringer, [place] Office number [phone] Pager [phone]    Assunta Curtis 08/07/2020, 2:38 PM

## 2020-08-08 DIAGNOSIS — R519 Headache, unspecified: Secondary | ICD-10-CM

## 2020-08-08 DIAGNOSIS — I6521 Occlusion and stenosis of right carotid artery: Secondary | ICD-10-CM

## 2020-08-08 LAB — BASIC METABOLIC PANEL
Anion gap: 6 (ref 5–15)
BUN: 10 mg/dL (ref 8–23)
CO2: 27 mmol/L (ref 22–32)
Calcium: 8.9 mg/dL (ref 8.9–10.3)
Chloride: 99 mmol/L (ref 98–111)
Creatinine, Ser: 0.69 mg/dL (ref 0.44–1.00)
GFR, Estimated: >60 mL/min (ref >60)
Glucose, Bld: 90 mg/dL (ref 70–99)
Potassium: 4.1 mmol/L (ref 3.5–5.1)
Sodium: 132 mmol/L — ABNORMAL LOW (ref 135–145)

## 2020-08-08 LAB — CBC
HCT: 35 % — ABNORMAL LOW (ref 36.0–46.0)
Hemoglobin: 11.3 g/dL — ABNORMAL LOW (ref 12.0–15.0)
MCH: 27.4 pg (ref 26.0–34.0)
MCHC: 32.3 g/dL (ref 30.0–36.0)
MCV: 85 fL (ref 80.0–100.0)
Platelets: 192 10*3/uL (ref 150–400)
RBC: 4.12 MIL/uL (ref 3.87–5.11)
RDW: 12.5 % (ref 11.5–15.5)
WBC: 6.1 10*3/uL (ref 4.0–10.5)
nRBC: 0 % (ref 0.0–0.2)

## 2020-08-08 LAB — TROPONIN I (HIGH SENSITIVITY)
Troponin I (High Sensitivity): 144 ng/L (ref <18)
Troponin I (High Sensitivity): 130 ng/L (ref <18)
Troponin I (High Sensitivity): 131 ng/L (ref <18)

## 2020-08-08 LAB — MAGNESIUM: Magnesium: 1.8 mg/dL (ref 1.7–2.4)

## 2020-08-08 MED ORDER — MAGNESIUM SULFATE 2 GM/50ML IV SOLN
2.0000 g | Freq: Once | INTRAVENOUS | Status: AC
  Administered 2020-08-08: 2 g via INTRAVENOUS
  Filled 2020-08-08: qty 50

## 2020-08-08 MED ORDER — CYANOCOBALAMIN 1000 MCG/ML IJ SOLN
1000.0000 ug | Freq: Every day | INTRAMUSCULAR | Status: DC
  Administered 2020-08-08 – 2020-08-10 (×2): 1000 ug via SUBCUTANEOUS
  Filled 2020-08-08 (×3): qty 1

## 2020-08-08 NOTE — Progress Notes (Addendum)
STROKE TEAM PROGRESS NOTE   INTERVAL HISTORY  Afebrile. VSS. Patient neurologically intact at this time no focal deficit.  Given her left arm symptoms and the right carotid stenosis with bulky plaque at carotid bulb, vascular surgery was consulted.  Plan for right CEA.  Currently on aspirin, plavix and lipitor.   Vitals:   08/08/20 0410 08/08/20 0800 08/08/20 0832 08/08/20 1203  BP: (!) 112/55 (!) 131/49  98/66  Pulse: 66 63  64  Resp: 16 16  16   Temp: 98.2 F (36.8 C) 97.9 F (36.6 C)  97.9 F (36.6 C)  TempSrc: Oral Oral  Oral  SpO2: 97%  97% 100%  Weight: 65 kg     Height:       CBC:  Recent Labs  Lab 08/06/20 1800 08/08/20 0558  WBC 9.9 6.1  NEUTROABS 8.0*  --   HGB 11.4* 11.3*  HCT 35.6* 35.0*  MCV 87.3 85.0  PLT 212 518   Basic Metabolic Panel:  Recent Labs  Lab 08/06/20 1800 08/08/20 0558  NA 135 132*  K 4.0 4.1  CL 102 99  CO2 27 27  GLUCOSE 104* 90  BUN 14 10  CREATININE 0.72 0.69  CALCIUM 9.0 8.9  MG  --  1.8   Lipid Panel:  Recent Labs  Lab 08/07/20 0050  CHOL 116  TRIG 102  HDL 46  CHOLHDL 2.5  VLDL 20  LDLCALC 50   HgbA1c:  Recent Labs  Lab 08/07/20 0439  HGBA1C 5.6   Urine Drug Screen:  Recent Labs  Lab 08/06/20 1836  LABOPIA NONE DETECTED  COCAINSCRNUR NONE DETECTED  LABBENZ NONE DETECTED  AMPHETMU NONE DETECTED  THCU NONE DETECTED  LABBARB NONE DETECTED    Alcohol Level No results for input(s): ETH in the last 168 hours.  IMAGING past 24 hours CT Angio Head/Neck W or Wo Contrast  Result Date: 08/06/2020 IMPRESSION:  1. Negative CTA for large vessel occlusion.  2. Bulky calcified plaque about the proximal ICAs bilaterally, with associated stenoses of up to 65% on the right and 50% on the left.  3. Predominant fetal type origin of the PCAs with overall diminutive vertebrobasilar system. Hypoplastic right vertebral artery largely terminates in PICA.  4. Few mildly enlarged mediastinal lymph nodes with asymmetric soft  tissue fullness at the left hilum and post treatment changes within the partially visualized left lung. Findings are overall similar as compared to previous CTA from 2019.  5. 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   CT HEAD WO CONTRAST  Result Date: 08/06/2020 IMPRESSION: No evidence of acute intracranial hemorrhage or acute demarcated cortical infarction. A lacunar infarct within the left basal ganglia is new as compared to the brain MRI of 03/12/2018, but otherwise age indeterminate. Known small infarcts within the left thalamus and left cerebellar hemisphere were better appreciated on the prior brain MRI of 03/12/2018.   CT Angio Neck W and/or Wo Contrast  Result Date: 08/06/2020 IMPRESSION:  1. Negative CTA for large vessel occlusion.  2. Bulky calcified plaque about the proximal ICAs bilaterally, with associated stenoses of up to 65% on the right and 50% on the left.  3. Predominant fetal type origin of the PCAs with overall diminutive vertebrobasilar system. Hypoplastic right vertebral artery largely terminates in PICA.  4. Few mildly enlarged mediastinal lymph nodes with asymmetric soft tissue fullness at the left hilum and post treatment changes within the partially visualized left lung. Findings are overall  similar as compared to previous CTA from 2019.  5. 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   MR BRAIN WO CONTRAST  Result Date: 08/07/2020 IMPRESSION:  1. No acute intracranial abnormality.  2. Multiple scattered remote lacunar infarcts involving the bilateral cerebellar hemispheres, left thalamus, and right caudate.    PHYSICAL EXAM General: Laying comfortably in bed; in no acute distress.  HENT: Normal oropharynx and mucosa. Normal external appearance of ears and nose.  Neck: Supple, no pain or tenderness  CV: No JVD. No peripheral edema.  Pulmonary: Symmetric Chest rise.  Normal respiratory effort.  Abdomen: Soft to touch, non-tender.  Ext: No cyanosis, edema, or deformity  Skin: Telangiectasias. Normal palpation of skin.  Musculoskeletal: Normal digits and nails by inspection. No clubbing.   Mental status/Cognition: Alert, oriented to self, place, month and year, good attention.  Speech/language: Fluent, comprehension intact, object naming intact, repetition intact.  Cranial nerves:   CN II Pupils equal and reactive to light, no VF deficits    CN III,IV,VI EOM intact, no gaze preference or deviation, no nystagmus    CN V normal sensation in V1, V2, and V3 segments bilaterally    CN VII no asymmetry, no nasolabial fold flattening    CN VIII normal hearing to speech    CN IX & X normal palatal elevation, no uvular deviation    CN XI 5/5 head turn and 5/5 shoulder shrug bilaterally    CN XII midline tongue protrusion    Motor:  Muscle bulk: normal, tone normal, pronator drift none tremor yes end movement tremors in BL arms.  ASSESSMENT/PLAN Joy Williams is a 70 y.o. female with history of chronic diastolic CHF, COPD, raynauds syndrome, telangectasia, severe mitral regurgitation, hypothyroidism, history of lung cancer who presents with dizziness and left arm incoordination, headache.  CT headshowing no evidence of acute intracranial hemorrhage or acute demarcated cortical infarction. A lacunar infarct within the left basal ganglia is new compared to brain MRI done in October 2019 but otherwise age indeterminate. 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     Possible TIA with symptomatic right ICA of 65% stenosis. Complicated migraine is also in DDx.   Recurrent episode similar to 02/2018 of dizziness, imbalance, left arm incoordination, not in control and headache.  CT head: No acute finding.  Age indeterminate lacunar infarct within the left basal  ganglia. Known small infarcts within the left thalamus and left cerebellar.  CTA head & neck: no LVO, Bulky calcified plaque about the proximal ICAs bilaterally, with associated stenoses of up to 65% on the right and 50% on the left.  MRI: No acute intracranial abnormality.  Multiple scattered remote lacunar infarcts involving the bilateral cerebellar hemispheres, left thalamus, and right caudate.  2D Echo EF 65-70%, mild mitral valve regurgitation  LDL 50  HgbA1c 5.6  VTE prophylaxis - Lovenox 40mg  daily  No antithrombotic prior to admission, now on aspirin 81 mg daily and clopidogrel 75 mg daily.   Therapy recommendations:  No OT follow up, Outpatient PT (for higher level balance activities)  Disposition:  pending  Bilateral ICA stenosis  02/2018 CTA neck right ICA heavily calcified plaque with high grade stenosis but with streak artifact. Left ICA heavily calcified plaque without significant stenosis  02/2018 CUS b/l 1-39% stenosis  This admission CTA neck: right ICA of 65% stenosis and left ICA 50%  stenosis  Vascularsurgery consulted - [name] recommend right CEA 3/23  On aspirin 81mg  and Plavix 75mg   Hx of TIA vs. Complicated migarine  [id number] admitted for episode of dizziness, imbalance gait, left arm weakness numbness and headache.  Status post TPA.  CT head left caudate head hypodensity.  CT head and neck bilateral ICA heavily calcified plaque high-grade stenosis of right ICA, but carotid Doppler unremarkable.  MRI no acute infarct.  EF 55-60%.  LDL 108, A1c 5.3.  Concerning for TIA versus complicated migraine, discharged on DAPT for 3 weeks as well as statin.  This admission MRI revealed  Multiple scattered remote lacunar infarcts involving the bilateral cerebellar hemispheres, left thalamus, and right caudate.   Hypertension  Home meds: metoprolol 12.5mg  dialy  Stable . BP goal 130-160 before right CEA . Avoid hypotension . negative for orthostatic hypotension:  lying 120/62, sitting 111/65, standing 108/62, standing at 3 minutes 117/58 . Long-term BP goal normotensive  Hyperlipidemia  Home meds:  Atorvastatin 20mg  daily, resumed in hospital  LDL 50, goal < 70  Continue statin at discharge  Other Stroke Risk Factors  Advanced Age >/= 14   Cigarette smoker advised to stop smoking  ETOH use, alcohol level <10, advised to drink no more than 1-2 drink(s) a day  Coronary artery disease  Severe mitral regurgitation s/p bioprosthetic MVR in 2018  CHF: Chronic diastolic heart failure  Other Active Problems  Hypothyroidism: synthroid 175 mcg daily  History of lung cancer 2016, s/p resection and chemotherapy. Treatment at [facility]  Tinnitus  Recent Ear pain and fullness: ear drops   Hospital day # 1   ATTENDING NOTE: I reviewed above note and agree with the assessment and plan. Pt was seen and examined.   No family at bedside.  Patient lying in bed, no acute event overnight.  Neuro stable.  No headache.  Patient was seen by vascular surgery [name], agree with right ICA high-grade stenosis, and recommend right CEA tomorrow.  Currently on DAPT and statin, avoid low BP, ideally BP 130-160 before CEA procedure. Will follow  For detailed assessment and plan, please refer to above as I have made changes wherever appropriate.   Rosalin Hawking, MD PhD Stroke Neurology 08/08/2020 4:44 PM      To contact Stroke Continuity provider, please refer to [email or web address] After hours, contact General Neurology

## 2020-08-08 NOTE — Evaluation (Signed)
Occupational Therapy Evaluation Patient Details Name: Joy Williams MRN: [id number] DOB: 01-Feb-1951 Today's Date: 08/08/2020    History of Present Illness 70 y.o. female with medical history significant of chronic diastolic heart failure, bilateral carotid artery stenosis, CAD, COPD, scleroderma, cold agglutinin disease, Raynaud's syndrome, telangiectasia, severe mitral regurgitation status post bioprosthetic MVR in 2018, hypothyroidism, COPD, history of lung cancer, history of TIA presented to the ED for evaluation of acute onset dizziness, left arm incoordination, and headache.   Clinical Impression   PTA, pt lives alone and reports Independence in all ADLs, IADLs and mobility without AD. Pt presents now with minor deficits in dynamic standing balance, endurance and delayed word finding difficulties. Pt overall Independent for sit to stand transfers without AD though benefits from min guard for hallway mobility with intermittent UE support on railing with balance challenges. Pt Independent for UB ADLs and Supervision for LB ADLs for safety. Pt's B UE strength 4+/5 and symmetrical with coordination WFL. Anticipate no OT follow-up needed at DC though pt would benefit from OT at acute level to maximize balance with ADLs/IADLs and increase endurance.     Follow Up Recommendations  No OT follow up;Supervision - Intermittent (intermittent assist with IADLs initially)    Equipment Recommendations  None recommended by OT    Recommendations for Other Services       Precautions / Restrictions Precautions Precautions: Fall Restrictions Weight Bearing Restrictions: No      Mobility Bed Mobility Overal bed mobility: Independent                  Transfers Overall transfer level: Independent Equipment used: None Transfers: Sit to/from [employer] transfer comment: Independent for sit to stand without AD, no LOB. min guard for mobility due to unsteadiness though no  overt LOB    Balance Overall balance assessment: Needs assistance Sitting-balance support: Feet supported Sitting balance-Leahy Scale: Good     Standing balance support: Single extremity supported;During functional activity;No upper extremity supported Standing balance-Leahy Scale: Fair Standing balance comment: benefits from rail support in hallway, especially with balance challenges                           ADL either performed or assessed with clinical judgement   ADL Overall ADL's : Needs assistance/impaired Eating/Feeding: Independent;Sitting   Grooming: Set up;Sitting   Upper Body Bathing: Independent;Sitting   Lower Body Bathing: Supervison/ safety;Sit to/from stand   Upper Body Dressing : Independent;Sitting   Lower Body Dressing: Supervision/safety;Sit to/from stand   Toilet Transfer: Supervision/safety;Ambulation Toilet Transfer Details (indicate cue type and reason): pt reports going to bathroom independently prior to OT entry [place] and Hygiene: Supervision/safety;Sit to/from stand       Functional mobility during ADLs: Min guard General ADL Comments: pt with minor deficits in dynamic standing balance tasks with unsteadiness noted. Pt with minor word finding difficulties though UE strength symmetrical and WFL.     Vision Baseline Vision/History: Wears glasses Wears Glasses: At all times Patient Visual Report: No change from baseline Vision Assessment?: No apparent visual deficits     Perception     Praxis      Pertinent Vitals/Pain Pain Assessment: No/denies pain     Hand Dominance Right   Extremity/Trunk Assessment Upper Extremity Assessment Upper Extremity Assessment: Overall WFL for tasks assessed (symmetrical, 4+/5 strength, coordination WFL)   Lower Extremity Assessment Lower Extremity Assessment:  Defer to PT evaluation   Cervical / Trunk Assessment Cervical / Trunk Assessment: Normal    Communication Communication Communication: No difficulties   Cognition Arousal/Alertness: Awake/alert Behavior During Therapy: WFL for tasks assessed/performed Overall Cognitive Status: Within Functional Limits for tasks assessed                                 General Comments: some word finding difficulties, WFL   General Comments  Pt noted with elevated troponins though trending down (144 > 130). Per RN, pt with normal EKG this AM, cleared for OOB activity.    Exercises     Shoulder Instructions      Home Living Family/patient expects to be discharged to:: Private residence Living Arrangements: Alone Available Help at Discharge: Family;Friend(s);Available PRN/intermittently Type of Home: House       Home Layout: One level     Bathroom Shower/Tub: Occupational psychologist: Standard         Additional Comments: reports she has a walker but unsure of what type      Prior Functioning/Environment Level of Independence: Independent        Comments: no AD used to walk, independent with all ADLs, IADLs and driving. Active        OT Problem List: Impaired balance (sitting and/or standing);Decreased activity tolerance      OT Treatment/Interventions: Self-care/ADL training;Therapeutic exercise;DME and/or AE instruction;Therapeutic activities;Patient/family education;Balance training    OT Goals(Current goals can be found in the care plan section) Acute Rehab OT Goals Patient Stated Goal: to get a bit stronger, return to baseline OT Goal Formulation: With patient Time For Goal Achievement: 08/22/20 Potential to Achieve Goals: Good ADL Goals Additional ADL Goal #1: Pt to complete 2-3 step trail making task without verbal cues Additional ADL Goal #2: Pt to demonstrate mobility during ADLs/IADLs with Modified Independence and ability to correct LOB without assistance. Additional ADL Goal #3: Pt to demonstrate IADL tasks in standing for > 10  min without rest break to maximize endurance  OT Frequency: Min 2X/week   Barriers to D/C:            Co-evaluation              AM-PAC OT "6 Clicks" Daily Activity     Outcome Measure Help from another person eating meals?: None Help from another person taking care of personal grooming?: None Help from another person toileting, which includes using toliet, bedpan, or urinal?: A Little Help from another person bathing (including washing, rinsing, drying)?: A Little Help from another person to put on and taking off regular upper body clothing?: None Help from another person to put on and taking off regular lower body clothing?: A Little 6 Click Score: 21   End of Session Equipment Utilized During Treatment: Gait belt  Activity Tolerance: Patient tolerated treatment well Patient left: in bed;with call bell/phone within reach  OT Visit Diagnosis: Unsteadiness on feet (R26.81);Other abnormalities of gait and mobility (R26.89)                Time: 3151-7616 OT Time Calculation (min): 13 min Charges:  OT General Charges $OT Visit: 1 Visit OT Evaluation $OT Eval Low Complexity: 1 Low  Malachy Chamber, OTR/L Acute Rehab Services Office: [phone]  Layla Maw 08/08/2020, 12:28 PM

## 2020-08-08 NOTE — Progress Notes (Addendum)
    Progress Note    08/08/2020 9:23 AM   Subjective:  No overnight complaints  Vitals:   08/08/20 0800 08/08/20 0832  BP: (!) 131/49   Pulse: 63   Resp: 16   Temp: 97.9 F (36.6 C)   SpO2:  97%    Physical Exam: aaox3 Non labored respirations Moving all extremities without limitation  CBC    Component Value Date/Time   WBC 6.1 08/08/2020 0558   RBC 4.12 08/08/2020 0558   HGB 11.3 (L) 08/08/2020 0558   HGB 12.2 01/14/2017 0849   HCT 35.0 (L) 08/08/2020 0558   HCT 37.6 01/14/2017 0849   PLT 192 08/08/2020 0558   PLT 182 01/14/2017 0849   MCV 85.0 08/08/2020 0558   MCV 87 01/14/2017 0849   MCH 27.4 08/08/2020 0558   MCHC 32.3 08/08/2020 0558   RDW 12.5 08/08/2020 0558   RDW 14.9 01/14/2017 0849   LYMPHSABS 1.1 08/06/2020 1800   LYMPHSABS 1.4 01/14/2017 0849   MONOABS 0.6 08/06/2020 1800   EOSABS 0.1 08/06/2020 1800   EOSABS 0.1 01/14/2017 0849   BASOSABS 0.0 08/06/2020 1800   BASOSABS 0.0 01/14/2017 0849    BMET    Component Value Date/Time   NA 132 (L) 08/08/2020 0558   NA 137 06/15/2020 1446   K 4.1 08/08/2020 0558   CL 99 08/08/2020 0558   CO2 27 08/08/2020 0558   GLUCOSE 90 08/08/2020 0558   BUN 10 08/08/2020 0558   BUN 12 06/15/2020 1446   CREATININE 0.69 08/08/2020 0558   CALCIUM 8.9 08/08/2020 0558   GFRNONAA >60 08/08/2020 0558   GFRAA 99 06/15/2020 1446    INR    Component Value Date/Time   INR 1.0 08/06/2020 1800     Intake/Output Summary (Last 24 hours) at 08/08/2020 3557 Last data filed at 08/08/2020 0751 Gross per 24 hour  Intake 256.7 ml  Output 2000 ml  Net -1743.3 ml     Assessment:  70 y.o. female is here with symptomatic right ica stenosis  Plan: N.p.o. past midnight for planned right carotid endarterectomy.  I have discussed the risk and benefits as well as alternatives with the patient and also with her daughter via telephone and they demonstrate good understanding.   Rionna Feltes C. Donzetta Matters, MD [facility] [place] Office: [phone] Pager: [phone]  08/08/2020 9:23 AM

## 2020-08-08 NOTE — Progress Notes (Signed)
Date and time results received: 08/08/20   Test: Troponin  Critical Value: 144  Name of Provider Notified: Dr. Grandville Silos  Orders Received: awaiting orders

## 2020-08-08 NOTE — Progress Notes (Signed)
Physical Therapy Treatment Patient Details Name: Joy Williams MRN: [id number] DOB: May 03, 1951 Today's Date: 08/08/2020    History of Present Illness 70 y.o. female with medical history significant of chronic diastolic heart failure, bilateral carotid artery stenosis, CAD, COPD, scleroderma, cold agglutinin disease, Raynaud's syndrome, telangiectasia, severe mitral regurgitation status post bioprosthetic MVR in 2018, hypothyroidism, COPD, history of lung cancer, history of TIA presented to the ED for evaluation of acute onset dizziness, left arm incoordination, and headache.    PT Comments    Pt received sitting on sofa, cooperative and pleasant. No new complaints, reports some lightheadedness occasionally during mobility that has been going on for several months. Vestibular screening negative. During coordination screen, some ataxic movement at end range of motion noted on R LE. However, pt moving well and is independent with most mobility. Pt scored 21/24 on DGA, indicating she is not at risk for falls. Was min guard for higher level balance activities and had most difficulty with single leg stance and static stance with eyes closed. Will continue to follow acutely and work on higher level balance tasks. Pt left sitting on sofa with all needs met, call bell within reach, and RN aware of status.     Follow Up Recommendations  Outpatient PT (for higher level balance activities)     Equipment Recommendations  None recommended by PT    Recommendations for Other Services       Precautions / Restrictions Precautions Precautions: Fall Restrictions Weight Bearing Restrictions: No    Mobility  Bed Mobility Overal bed mobility: Independent                  Transfers Overall transfer level: Independent Equipment used: None Transfers: Sit to/from [employer] transfer comment: Independent for sit to stand without AD, no LOB. min guard for mobility due to  unsteadiness though no overt LOB  Ambulation/Gait Ambulation/Gait assistance: Min guard;Supervision Gait Distance (Feet): 450 Feet (450 total, 250 during DGI testig) Assistive device: None Gait Pattern/deviations: Step-through pattern;Decreased stride length;Decreased weight shift to right Gait velocity: decreased   General Gait Details: Steady during forward walking, min guard for higher level gait activities, supervision for walking forward   Stairs             Wheelchair Mobility    Modified Rankin (Stroke Patients Only)       Balance Overall balance assessment: Needs assistance Sitting-balance support: Feet supported Sitting balance-Leahy Scale: Normal     Standing balance support: Single extremity supported;During functional activity;No upper extremity supported Standing balance-Leahy Scale: Good Standing balance comment: Steady during ambulation and statis standing with 2 LEs, increased difficulty in single leg stance Single Leg Stance - Right Leg: 5 Single Leg Stance - Left Leg: 9       [place] - Eyes Closed: 16     Standardized Balance Assessment Standardized Balance Assessment : Dynamic Gait Index   Dynamic Gait Index Level Surface: Normal Change in Gait Speed: Mild Impairment (not a significant change in speed but no LOB) Gait with Horizontal Head Turns: Normal Gait with Vertical Head Turns: Mild Impairment (slight drift to L with vertical head turns) Gait and Pivot Turn: Normal Step Over Obstacle: Normal Step Around Obstacles: Normal Steps: Mild Impairment Total Score: 21      Cognition Arousal/Alertness: Awake/alert Behavior During Therapy: WFL for tasks assessed/performed Overall Cognitive Status: Within Functional Limits for tasks assessed  General Comments: Cooperative, pleasant      Exercises      General Comments General comments (skin integrity, edema, etc.): Pt noted with elevated  troponins though trending down (144 > 130). Per RN, pt with normal EKG this AM, cleared for OOB activity.      Pertinent Vitals/Pain Pain Assessment: No/denies pain    Home Living Family/patient expects to be discharged to:: Private residence Living Arrangements: Alone Available Help at Discharge: Family;Friend(s);Available PRN/intermittently Type of Home: House     Home Layout: One level   Additional Comments: reports she has a walker but unsure of what type    Prior Function Level of Independence: Independent      Comments: no AD used to walk, independent with all ADLs, IADLs and driving. Active   PT Goals (current goals can now be found in the care plan section) Acute Rehab PT Goals Patient Stated Goal: to get a bit stronger, return to baseline PT Goal Formulation: With patient Time For Goal Achievement: 08/21/20 Potential to Achieve Goals: Good Progress towards PT goals: Progressing toward goals    Frequency    Min 4X/week      PT Plan Discharge plan needs to be updated    Co-evaluation              AM-PAC PT "6 Clicks" Mobility   Outcome Measure  Help needed turning from your back to your side while in a flat bed without using bedrails?: None Help needed moving from lying on your back to sitting on the side of a flat bed without using bedrails?: None Help needed moving to and from a bed to a chair (including a wheelchair)?: None Help needed standing up from a chair using your arms (e.g., wheelchair or bedside chair)?: None Help needed to walk in hospital room?: None Help needed climbing 3-5 steps with a railing? : None 6 Click Score: 24    End of Session Equipment Utilized During Treatment: Gait belt Activity Tolerance: Patient tolerated treatment well Patient left: with call bell/phone within reach;Other (comment) (sitting on sofa) Nurse Communication: Mobility status PT Visit Diagnosis: Unsteadiness on feet (R26.81);Muscle weakness (generalized)  (M62.81)     Time:  -     Charges:                        Rosita Kea, SPT

## 2020-08-08 NOTE — Progress Notes (Signed)
PROGRESS NOTE    Joy Williams  ERX:[id number] DOB: 1950-12-09 DOA: 08/06/2020 PCP: Lorrene Reid, PA-C    Chief Complaint  Patient presents with  . Dizziness    Patient at home when symptoms of loss of coordination developed. Patient stated to have numbness/weakness on the left side. Symptoms resolved when EMS arrived. Patient still has complaints of headache    Brief Narrative:  Patient is a 70 year old female history of chronic diastolic CHF, COPD, renal syndrome, telangiectasias, severe mitral regurg, hypothyroidism presented with dizziness and left arm incoordination in addition to some gait abnormalities.  Patient also noted headache on presentation.  Patient noted to have been on aspirin at home which she stopped about a week ago as she states she was instructed to do so per her cardiologist. Patient also noted to endorse some lightheadedness sometimes when going from seated to standing position. -Head CT done negative for any acute intracranial hemorrhage or acute demarcated cortical infarct, lacunar infarct within the left basal ganglia new compared to brain MRI October 2019 but otherwise age-indeterminate.  MRI brain with known small infarct within the left thalamus and left cerebellar hemisphere.  Neurology consulted Patient admitted for stroke work-up.   Assessment & Plan:   Principal Problem:   TIA (transient ischemic attack) Active Problems:   Symptomatic stenosis of right carotid artery   Chronic diastolic congestive heart failure (HCC)   Chronic obstructive pulmonary disease (HCC)   Hypothyroidism   Tobacco abuse   Anemia  1 complicated migraine versus TIA/symptomatic right carotid artery disease -Patient had presented with evaluation of sudden onset dizziness, left upper extremity incoordination, headache, gait abnormalities which lasted about 25 to 30 minutes and subsequently resolved. -Head CT done negative for any acute abnormalities. -MRI brain negative  for any acute abnormalities, multiple scattered remote lacunar infarcts involving bilateral cerebellar hemispheres, left thalamus, right caudate. 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 -2D echo with EF 65 to 70%, NWMA, mild LVH, moderate left atrial size dilated, mild MVR, no source of emboli noted.  -Risk factor modification  -Continue statin, Plavix, aspirin.   -Neurology consulted and are following.   -Vascular surgery, Dr Donzetta Matters consulted and patient scheduled for right CEA tomorrow, 08/09/2020. -Per neurology and vascular surgery.  2.  Chronic diastolic heart failure -Compensated.   -Continue home regimen Toprol-XL.   -Monitor volume status closely.   3.  COPD Stable.  Home trelegy.  4.  Hypothyroidism -TSH 0.603 (06/15/2020 ) -Continue Synthroid.   -Outpatient follow-up.    5.  Tobacco abuse Tobacco cessation.  Nicotine patch.  6.  Hyperlipidemia -LDL 50.   -Continue statin.    7.  Coronary artery disease/elevated troponin -Patient noted to have reported some chest tightness at the same time as a neurological symptoms which has since resolved. -EKG with no ischemic changes noted. -Cardiac enzymes elevated but flattened.   -2D echo with EF 65 to 70%, NWMA, mild LVH, normal right ventricular systolic function, moderately dilated left atrial size, mild MVR.   -No further cardiac work-up needed.   Continue aspirin, Plavix, Toprol-XL statin -Outpatient follow-up.   DVT prophylaxis: Lovenox Code Status: Full Family Communication: Updated patient.  No family at bedside.  Disposition:   Status is:     Dispo: The patient is from: Home  Anticipated d/c is to: Home              Patient currently awaiting to undergo CEA per vascular surgery tomorrow.   Not stable for discharge.    Difficult to place patient no       Consultants:   Neurology: Dr.Khaliqdina 08/06/2020  Vascular surgery :[name] 08/07/2020  Procedures:  CT head 08/06/2020  CT angiogram head and neck 08/06/2020  MRI brain 08/06/2020  2D echo  08/07/2020  Antimicrobials:   None   Subjective: Patient sitting up in chair about to work with physical therapy.   Denies any chest pain, no shortness of breath, no abdominal pain.  Describes some chest discomfort with coughing.  No left-sided weakness.  Denies any further headaches.  Denies any visual changes.   Objective: Vitals:   08/08/20 0410 08/08/20 0800 08/08/20 0832 08/08/20 1203  BP: (!) 112/55 (!) 131/49  98/66  Pulse: 66 63  64  Resp: 16 16  16   Temp: 98.2 F (36.8 C) 97.9 F (36.6 C)  97.9 F (36.6 C)  TempSrc: Oral Oral  Oral  SpO2: 97%  97% 100%  Weight: 65 kg     Height:        Intake/Output Summary (Last 24 hours) at 08/08/2020 1552 Last data filed at 08/08/2020 1407 Gross per 24 hour  Intake 250 ml  Output 2900 ml  Net -2650 ml   Filed Weights   08/06/20 1655 08/08/20 0410  Weight: 65.8 kg 65 kg    Examination:  General exam: : NAD Respiratory system: CTA B.  No wheezes, no rhonchi.  Speaking in full sentences.  Normal respiratory effort. Cardiovascular system: Regular rate and rhythm no murmurs rubs or gallops.  No JVD.  No lower extremity edema.  Gastrointestinal system: Abdomen soft, nontender, nondistended, positive bowel sounds.  No rebound.  No guarding. Central nervous system: Alert and oriented. No focal neurological deficits. Extremities: Symmetric 5 x 5 power. Skin: No rashes, lesions or ulcers Psychiatry: Judgement and insight appear normal. Mood & affect appropriate.  Data Reviewed: I have personally reviewed following labs and imaging studies  CBC: Recent Labs  Lab 08/06/20 1800 08/08/20 0558  WBC 9.9 6.1  NEUTROABS 8.0*  --   HGB 11.4* 11.3*  HCT 35.6* 35.0*   MCV 87.3 85.0  PLT 212 841    Basic Metabolic Panel: Recent Labs  Lab 08/06/20 1800 08/08/20 0558  NA 135 132*  K 4.0 4.1  CL 102 99  CO2 27 27  GLUCOSE 104* 90  BUN 14 10  CREATININE 0.72 0.69  CALCIUM 9.0 8.9  MG  --  1.8    GFR: Estimated Creatinine Clearance: 64.5 mL/min (by C-G formula based on SCr of 0.69 mg/dL).  Liver Function Tests: Recent Labs  Lab 08/06/20 1800  AST 29  ALT 20  ALKPHOS 64  BILITOT 0.3  PROT 6.3*  ALBUMIN 3.5    CBG: No results for input(s): GLUCAP in the last 168 hours.   Recent Results (from the past 240 hour(s))  Resp Panel by RT-PCR (Flu A&B, Covid) Nasopharyngeal Swab     Status: None   Collection Time: 08/07/20 12:08 PM   Specimen: Nasopharyngeal Swab; Nasopharyngeal(NP) swabs in vial transport medium  Result Value Ref Range Status   SARS Coronavirus 2 by RT PCR NEGATIVE NEGATIVE Final    Comment: (NOTE) SARS-CoV-2 target nucleic acids are NOT DETECTED.  The SARS-CoV-2 RNA is generally detectable in upper respiratory specimens during the acute phase  of infection. The lowest concentration of SARS-CoV-2 viral copies this assay can detect is 138 copies/mL. A negative result does not preclude SARS-Cov-2 infection and should not be used as the sole basis for treatment or other patient management decisions. A negative result may occur with  improper specimen collection/handling, submission of specimen other than nasopharyngeal swab, presence of viral mutation(s) within the areas targeted by this assay, and inadequate number of viral copies(<138 copies/mL). A negative result must be combined with clinical observations, patient history, and epidemiological information. The expected result is Negative.  Fact Sheet for Patients:  EntrepreneurPulse.com.au  Fact Sheet for Healthcare Providers:  IncredibleEmployment.be  This test is no t yet approved or cleared by the [place] FDA and   has been authorized for detection and/or diagnosis of SARS-CoV-2 by FDA under an Emergency Use Authorization (EUA). This EUA will remain  in effect (meaning this test can be used) for the duration of the COVID-19 declaration under Section 564(b)(1) of the Act, 21 U.S.C.section 360bbb-3(b)(1), unless the authorization is terminated  or revoked sooner.       Influenza A by PCR NEGATIVE NEGATIVE Final   Influenza B by PCR NEGATIVE NEGATIVE Final    Comment: (NOTE) The Xpert Xpress SARS-CoV-2/FLU/RSV plus assay is intended as an aid in the diagnosis of influenza from Nasopharyngeal swab specimens and should not be used as a sole basis for treatment. Nasal washings and aspirates are unacceptable for Xpert Xpress SARS-CoV-2/FLU/RSV testing.  Fact Sheet for Patients: EntrepreneurPulse.com.au  Fact Sheet for Healthcare Providers: IncredibleEmployment.be  This test is not yet approved or cleared by the [place] FDA and has been authorized for detection and/or diagnosis of SARS-CoV-2 by FDA under an Emergency Use Authorization (EUA). This EUA will remain in effect (meaning this test can be used) for the duration of the COVID-19 declaration under Section 564(b)(1) of the Act, 21 U.S.C. section 360bbb-3(b)(1), unless the authorization is terminated or revoked.  Performed at [facility], [place] [address], [place],  [address]          Radiology Studies: CT Angio Head W or Wo Contrast  Result Date: 08/06/2020 CLINICAL DATA:  Initial evaluation for acute onset dizziness. EXAM: CT ANGIOGRAPHY HEAD AND NECK TECHNIQUE: Multidetector CT imaging of the head and neck was performed using the standard protocol during bolus administration of intravenous contrast. Multiplanar CT image reconstructions and MIPs were obtained to evaluate the vascular anatomy. Carotid stenosis measurements (when applicable) are obtained utilizing NASCET criteria,  using the distal internal carotid diameter as the denominator. CONTRAST:  47mL OMNIPAQUE IOHEXOL 350 MG/ML SOLN COMPARISON:  Prior head CT from earlier the same day. Comparison made with prior CTA from 03/11/2018 as well. FINDINGS: CTA NECK FINDINGS Aortic arch: Visualized aortic arch normal in caliber with normal 3 vessel morphology. Mild-to-moderate atheromatous change about the arch and origin of the great vessels without hemodynamically significant stenosis. Visualized subclavian arteries widely patent. Right carotid system: Right CCA patent from its origin to the bifurcation without stenosis. Bulky calcified plaque about the proximal right ICA with associated stenosis of up to 65% by NASCET criteria. Streak artifact partially obscures this region. Right ICA patent distally to the skull base. Left carotid system: Left CCA patent from its origin to the bifurcation without stenosis. Bulky calcified plaque about the left carotid bulb/proximal left ICA with associated stenosis of up to 50% by NASCET criteria. Left ICA patent distally to the skull base. Vertebral arteries: Both vertebral arteries arise from the subclavian arteries.  Strongly dominant left vertebral artery with a diffusely hypoplastic right vertebral artery. No significant proximal subclavian artery stenosis. Vertebral arteries widely patent within the neck without stenosis, dissection or occlusion. Skeleton: No visible acute osseous finding. No discrete or worrisome osseous lesions. Degenerative grade 1 anterolisthesis of C3 on C4 and C4 on C5, with advanced degenerative spondylosis at C5-6 and C6-7. Other neck: No other acute soft tissue abnormality within neck. No mass or adenopathy. 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 Associated asymmetric sclerosis and medialization of the left arytenoid cartilage. No visible discrete lesion. Upper chest: Postsurgical changes present at the partially  visualized left lung. 1.2 cm prevascular node noted. Additional 1.3 cm precarinal node. Asymmetric soft tissue fullness within the visualized left hilum. Review of the MIP images confirms the above findings CTA HEAD FINDINGS Anterior circulation: Petrous segments patent bilaterally. Mild atheromatous change within the carotid siphons without significant stenosis. A1 segments patent bilaterally. Normal anterior communicating artery complex. Anterior cerebral arteries patent to their distal aspects without stenosis. No M1 stenosis or occlusion. Normal MCA bifurcations. Distal MCA branches well perfused and symmetric. Posterior circulation: Dominant left vertebral artery patent to the vertebrobasilar junction without stenosis. Hypoplastic right vertebral artery largely terminates in PICA, although a tiny branch ascending towards the vertebrobasilar junction. Left PICA not visualized. Basilar diminutive but widely patent to its distal aspect. Dominant left AICA. Superior cerebellar arteries patent bilaterally. Predominant fetal type origin of both PCAs. PCAs well perfused and patent to their distal aspects. Venous sinuses: Patent. Anatomic variants: Predominant fetal type origin of the PCAs. Hypoplastic right vertebral artery largely terminates in PICA. No aneurysm. Review of the MIP images confirms the above findings IMPRESSION: 1. Negative CTA for large vessel occlusion. 2. Bulky calcified plaque about the proximal ICAs bilaterally, with associated stenoses of up to 65% on the right and 50% on the left. 3. Predominant fetal type origin of the PCAs with overall diminutive vertebrobasilar system. Hypoplastic right vertebral artery largely terminates in PICA. 4. Few mildly enlarged mediastinal lymph nodes with asymmetric soft tissue fullness at the left hilum and post treatment changes within the partially visualized left lung. Findings are overall similar as compared to previous CTA from 2019. 5. 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 Electronically Signed   By: Jeannine Boga M.D.   On: 08/06/2020 23:36   CT HEAD WO CONTRAST  Result Date: 08/06/2020 CLINICAL DATA:  Neuro deficit, acute, stroke suspected. Additional history provided: Bilateral ear pain/pressure, headache. EXAM: CT HEAD WITHOUT CONTRAST TECHNIQUE: Contiguous axial images were obtained from the base of the skull through the vertex without intravenous contrast. COMPARISON:  Brain MRI 03/12/2018. FINDINGS: Brain: Cerebral volume is normal for age. Lacunar infarct within the left basal ganglia, new as compared to the brain MRI of 03/12/2018 but otherwise age indeterminate (series 3, image 13) (series 5, image 24). Known small infarcts within the left thalamus and left cerebellar hemisphere better appreciated on the prior brain MRI of 03/12/2018. There is no acute intracranial hemorrhage. No demarcated cortical infarct. No extra-axial fluid collection. No evidence of intracranial mass. No midline shift. Vascular: No hyperdense vessel.  Atherosclerotic calcifications Skull: Normal. Negative for fracture or focal lesion. Sinuses/Orbits: Visualized orbits show no acute finding. No significant paranasal sinus disease at the imaged levels. IMPRESSION: No evidence of acute intracranial hemorrhage or acute demarcated cortical infarction. A lacunar infarct within the left basal ganglia is new  as compared to the brain MRI of 03/12/2018, but otherwise age indeterminate. Known small infarcts within the left thalamus and left cerebellar hemisphere were better appreciated on the prior brain MRI of 03/12/2018. Electronically Signed   By: Kellie Simmering DO   On: 08/06/2020 18:30   CT Angio Neck W and/or Wo Contrast  Result Date: 08/06/2020 CLINICAL DATA:  Initial evaluation for acute onset dizziness. EXAM: CT ANGIOGRAPHY HEAD AND NECK TECHNIQUE: Multidetector CT imaging of the head and neck was performed using the  standard protocol during bolus administration of intravenous contrast. Multiplanar CT image reconstructions and MIPs were obtained to evaluate the vascular anatomy. Carotid stenosis measurements (when applicable) are obtained utilizing NASCET criteria, using the distal internal carotid diameter as the denominator. CONTRAST:  25mL OMNIPAQUE IOHEXOL 350 MG/ML SOLN COMPARISON:  Prior head CT from earlier the same day. Comparison made with prior CTA from 03/11/2018 as well. FINDINGS: CTA NECK FINDINGS Aortic arch: Visualized aortic arch normal in caliber with normal 3 vessel morphology. Mild-to-moderate atheromatous change about the arch and origin of the great vessels without hemodynamically significant stenosis. Visualized subclavian arteries widely patent. Right carotid system: Right CCA patent from its origin to the bifurcation without stenosis. Bulky calcified plaque about the proximal right ICA with associated stenosis of up to 65% by NASCET criteria. Streak artifact partially obscures this region. Right ICA patent distally to the skull base. Left carotid system: Left CCA patent from its origin to the bifurcation without stenosis. Bulky calcified plaque about the left carotid bulb/proximal left ICA with associated stenosis of up to 50% by NASCET criteria. Left ICA patent distally to the skull base. Vertebral arteries: Both vertebral arteries arise from the subclavian arteries. Strongly dominant left vertebral artery with a diffusely hypoplastic right vertebral artery. No significant proximal subclavian artery stenosis. Vertebral arteries widely patent within the neck without stenosis, dissection or occlusion. Skeleton: No visible acute osseous finding. No discrete or worrisome osseous lesions. Degenerative grade 1 anterolisthesis of C3 on C4 and C4 on C5, with advanced degenerative spondylosis at C5-6 and C6-7. Other neck: No other acute soft tissue abnormality within neck. No mass or adenopathy. 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 Associated asymmetric sclerosis and medialization of the left arytenoid cartilage. No visible discrete lesion. Upper chest: Postsurgical changes present at the partially visualized left lung. 1.2 cm prevascular node noted. Additional 1.3 cm precarinal node. Asymmetric soft tissue fullness within the visualized left hilum. Review of the MIP images confirms the above findings CTA HEAD FINDINGS Anterior circulation: Petrous segments patent bilaterally. Mild atheromatous change within the carotid siphons without significant stenosis. A1 segments patent bilaterally. Normal anterior communicating artery complex. Anterior cerebral arteries patent to their distal aspects without stenosis. No M1 stenosis or occlusion. Normal MCA bifurcations. Distal MCA branches well perfused and symmetric. Posterior circulation: Dominant left vertebral artery patent to the vertebrobasilar junction without stenosis. Hypoplastic right vertebral artery largely terminates in PICA, although a tiny branch ascending towards the vertebrobasilar junction. Left PICA not visualized. Basilar diminutive but widely patent to its distal aspect. Dominant left AICA. Superior cerebellar arteries patent bilaterally. Predominant fetal type origin of both PCAs. PCAs well perfused and patent to their distal aspects. Venous sinuses: Patent. Anatomic variants: Predominant fetal type origin of the PCAs. Hypoplastic right vertebral artery largely terminates in PICA. No aneurysm. Review of the MIP images confirms the above findings IMPRESSION: 1. Negative CTA for large vessel occlusion. 2. Bulky calcified plaque  about the proximal ICAs bilaterally, with associated stenoses of up to 65% on the right and 50% on the left. 3. Predominant fetal type origin of the PCAs with overall diminutive vertebrobasilar system. Hypoplastic right vertebral artery largely terminates in PICA. 4. Few  mildly enlarged mediastinal lymph nodes with asymmetric soft tissue fullness at the left hilum and post treatment changes within the partially visualized left lung. Findings are overall similar as compared to previous CTA from 2019. 5. 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 Electronically Signed   By: Jeannine Boga M.D.   On: 08/06/2020 23:36   MR BRAIN WO CONTRAST  Result Date: 08/07/2020 CLINICAL DATA:  Initial evaluation for neuro deficit, stroke suspected. EXAM: MRI HEAD WITHOUT CONTRAST TECHNIQUE: Multiplanar, multiecho pulse sequences of the brain and surrounding structures were obtained without intravenous contrast. COMPARISON:  Prior CTs from 08/06/2020 as well as previous MRI from 03/12/2018. FINDINGS: Brain: Cerebral volume within normal limits for age. Multiple scattered remote lacunar type infarcts seen about the bilateral cerebellar hemispheres and left thalamus. Additional tiny remote lacunar infarct at the right caudate. Overall, appearance has progressed as compared to previous MRI from 2019. No abnormal foci of restricted diffusion to suggest acute or subacute ischemia. Gray-white matter differentiation otherwise maintained. No areas of remote cortical infarction. No acute intracranial hemorrhage. Few punctate foci of susceptibility artifact noted, consistent with small chronic micro hemorrhages, suspected to be hypertensive in nature. No mass lesion, midline shift or mass effect. No hydrocephalus or extra-axial fluid collection. Pituitary gland and suprasellar region normal. Midline structures intact. Vascular: Major intracranial vascular flow voids are maintained. Skull and upper cervical spine: Craniocervical junction within normal limits. Bone marrow signal intensity normal. No scalp soft tissue abnormality. Sinuses/Orbits: Globes and orbital soft tissues demonstrate no acute finding. Paranasal sinuses are largely clear. No mastoid  effusion. Inner ear structures grossly normal. Other: None. IMPRESSION: 1. No acute intracranial abnormality. 2. Multiple scattered remote lacunar infarcts involving the bilateral cerebellar hemispheres, left thalamus, and right caudate. Overall, appearance has progressed as compared to previous MRI from 2019. Electronically Signed   By: Jeannine Boga M.D.   On: 08/07/2020 02:53   ECHOCARDIOGRAM COMPLETE  Result Date: 08/07/2020    ECHOCARDIOGRAM REPORT   Patient Name:   Joy Williams Date of Exam: 08/07/2020 Medical Rec #:  [id number]        Height:       67.0 in Accession #:    [id number]       Weight:       145.0 lb Date of Birth:  01/13/1951        BSA:          1.764 m Patient Age:    64 years         BP:           117/58 mmHg Patient Gender: F                HR:           77 bpm. Exam Location:  Inpatient Procedure: Cardiac Doppler and Color Doppler Indications:    TIA  History:        Patient has prior history of Echocardiogram examinations, most                 recent 02/25/2019.  Sonographer:    Merrie Roof RDCS Referring Phys: [id number] [place]  1. Left ventricular ejection fraction, by estimation, is  65 to 70%. The left ventricle has normal function. The left ventricle has no regional wall motion abnormalities. There is mild left ventricular hypertrophy.  2. Right ventricular systolic function is normal. The right ventricular size is normal.  3. Left atrial size was moderately dilated.  4. Mild mitral valve regurgitation. Moderate mitral annular calcification.  5. The aortic valve is tricuspid. Aortic valve regurgitation is not visualized. Mild aortic valve sclerosis is present, with no evidence of aortic valve stenosis.  6. The inferior vena cava is normal in size with greater than 50% respiratory variability, suggesting right atrial pressure of 3 mmHg. Comparison(s): The left ventricular function is unchanged. FINDINGS  Left Ventricle: Left ventricular ejection fraction,  by estimation, is 65 to 70%. The left ventricle has normal function. The left ventricle has no regional wall motion abnormalities. The left ventricular internal cavity size was normal in size. There is  mild left ventricular hypertrophy. Right Ventricle: The right ventricular size is normal. Right vetricular wall thickness was not assessed. Right ventricular systolic function is normal. Left Atrium: Left atrial size was moderately dilated. Right Atrium: Right atrial size was normal in size. Pericardium: There is no evidence of pericardial effusion. Mitral Valve: Moderate mitral annular calcification. Mild mitral valve regurgitation. MV peak gradient, 5.6 mmHg. The mean mitral valve gradient is 2.0 mmHg. Tricuspid Valve: The tricuspid valve is normal in structure. Tricuspid valve regurgitation is mild. Aortic Valve: The aortic valve is tricuspid. Aortic valve regurgitation is not visualized. Mild aortic valve sclerosis is present, with no evidence of aortic valve stenosis. Aortic valve mean gradient measures 6.0 mmHg. Aortic valve peak gradient measures 12.7 mmHg. Aortic valve area, by VTI measures 2.07 cm. Pulmonic Valve: The pulmonic valve was normal in structure. Pulmonic valve regurgitation is trivial. Aorta: The aortic root and ascending aorta are structurally normal, with no evidence of dilitation. Venous: The inferior vena cava is normal in size with greater than 50% respiratory variability, suggesting right atrial pressure of 3 mmHg. IAS/Shunts: No atrial level shunt detected by color flow Doppler.  LEFT VENTRICLE PLAX 2D LVIDd:         3.50 cm LVIDs:         2.30 cm LV PW:         1.30 cm LV IVS:        1.30 cm LVOT diam:     1.80 cm LV SV:         77 LV SV Index:   43 LVOT Area:     2.54 cm  RIGHT VENTRICLE          IVC RV Basal diam:  3.60 cm  IVC diam: 1.20 cm LEFT ATRIUM           Index       RIGHT ATRIUM           Index LA diam:      3.60 cm 2.04 cm/m  RA Area:     16.00 cm LA Vol (A4C): 78.3 ml  44.39 ml/m RA Volume:   38.80 ml  22.00 ml/m  AORTIC VALVE AV Area (Vmax):    2.16 cm AV Area (Vmean):   2.12 cm AV Area (VTI):     2.07 cm AV Vmax:           178.00 cm/s AV Vmean:          121.000 cm/s AV VTI:            0.370 m AV Peak Grad:  12.7 mmHg AV Mean Grad:      6.0 mmHg LVOT Vmax:         151.00 cm/s LVOT Vmean:        101.000 cm/s LVOT VTI:          0.301 m LVOT/AV VTI ratio: 0.81  AORTA Ao Root diam: 3.10 cm Ao Asc diam:  3.10 cm MITRAL VALVE MV Area (PHT): 3.17 cm    SHUNTS MV Area VTI:   2.43 cm    Systemic VTI:  0.30 m MV Peak grad:  5.6 mmHg    Systemic Diam: 1.80 cm MV Mean grad:  2.0 mmHg MV Vmax:       1.18 m/s MV Vmean:      69.7 cm/s MV Decel Time: 239 msec MV E velocity: 98.10 cm/s MV A velocity: 89.50 cm/s MV E/A ratio:  1.10 Dorris Carnes MD Electronically signed by Dorris Carnes MD Signature Date/Time: 08/07/2020/5:00:45 PM    Final         Scheduled Meds: . aspirin EC  81 mg Oral Daily  . atorvastatin  20 mg Oral q1800  . clopidogrel  75 mg Oral Daily  . cyanocobalamin  1,000 mcg Subcutaneous Daily  . enoxaparin (LOVENOX) injection  40 mg Subcutaneous Q24H  . fluticasone furoate-vilanterol  1 puff Inhalation Daily   And  . umeclidinium bromide  1 puff Inhalation Daily  . levothyroxine  175 mcg Oral QAC breakfast  . metoprolol succinate  12.5 mg Oral QPM  . nicotine  7 mg Transdermal Daily   Continuous Infusions:    LOS: 1 day    Time spent: 35 minutes    Irine Seal, MD [facility]   To contact the attending provider between 7A-7P or the covering provider during after hours 7P-7A, please log into the web site [email or web address] and access using universal [place] password for that web site. If you do not have the password, please call the hospital operator.  08/08/2020, 3:52 PM

## 2020-08-08 NOTE — H&P (View-Only) (Signed)
    Progress Note    08/08/2020 9:23 AM   Subjective:  No overnight complaints  Vitals:   08/08/20 0800 08/08/20 0832  BP: (!) 131/49   Pulse: 63   Resp: 16   Temp: 97.9 F (36.6 C)   SpO2:  97%    Physical Exam: aaox3 Non labored respirations Moving all extremities without limitation  CBC    Component Value Date/Time   WBC 6.1 08/08/2020 0558   RBC 4.12 08/08/2020 0558   HGB 11.3 (L) 08/08/2020 0558   HGB 12.2 01/14/2017 0849   HCT 35.0 (L) 08/08/2020 0558   HCT 37.6 01/14/2017 0849   PLT 192 08/08/2020 0558   PLT 182 01/14/2017 0849   MCV 85.0 08/08/2020 0558   MCV 87 01/14/2017 0849   MCH 27.4 08/08/2020 0558   MCHC 32.3 08/08/2020 0558   RDW 12.5 08/08/2020 0558   RDW 14.9 01/14/2017 0849   LYMPHSABS 1.1 08/06/2020 1800   LYMPHSABS 1.4 01/14/2017 0849   MONOABS 0.6 08/06/2020 1800   EOSABS 0.1 08/06/2020 1800   EOSABS 0.1 01/14/2017 0849   BASOSABS 0.0 08/06/2020 1800   BASOSABS 0.0 01/14/2017 0849    BMET    Component Value Date/Time   NA 132 (L) 08/08/2020 0558   NA 137 06/15/2020 1446   K 4.1 08/08/2020 0558   CL 99 08/08/2020 0558   CO2 27 08/08/2020 0558   GLUCOSE 90 08/08/2020 0558   BUN 10 08/08/2020 0558   BUN 12 06/15/2020 1446   CREATININE 0.69 08/08/2020 0558   CALCIUM 8.9 08/08/2020 0558   GFRNONAA >60 08/08/2020 0558   GFRAA 99 06/15/2020 1446    INR    Component Value Date/Time   INR 1.0 08/06/2020 1800     Intake/Output Summary (Last 24 hours) at 08/08/2020 7943 Last data filed at 08/08/2020 0751 Gross per 24 hour  Intake 256.7 ml  Output 2000 ml  Net -1743.3 ml     Assessment:  70 y.o. female is here with symptomatic right ica stenosis  Plan: N.p.o. past midnight for planned right carotid endarterectomy.  I have discussed the risk and benefits as well as alternatives with the patient and also with her daughter via telephone and they demonstrate good understanding.   Laterrian Hevener C. Donzetta Matters, MD [facility] Office: [phone] Pager: [phone]  08/08/2020 9:23 AM

## 2020-08-09 ENCOUNTER — Encounter (HOSPITAL_COMMUNITY): Payer: Self-pay | Admitting: Internal Medicine

## 2020-08-09 ENCOUNTER — Inpatient Hospital Stay (HOSPITAL_COMMUNITY): Payer: Medicare Other | Admitting: Certified Registered"

## 2020-08-09 ENCOUNTER — Encounter (HOSPITAL_COMMUNITY)
Admission: EM | Disposition: A | Discharge status: Home / Self Care | Payer: Self-pay | Source: Home / Self Care | Attending: Internal Medicine

## 2020-08-09 HISTORY — PX: ENDARTERECTOMY: SHX5162

## 2020-08-09 LAB — CBC WITH DIFFERENTIAL/PLATELET
Abs Immature Granulocytes: 0.04 10*3/uL (ref 0.00–0.07)
Basophils Absolute: 0 10*3/uL (ref 0.0–0.1)
Basophils Relative: 0 %
Eosinophils Absolute: 0.1 10*3/uL (ref 0.0–0.5)
Eosinophils Relative: 1 %
HCT: 34.4 % — ABNORMAL LOW (ref 36.0–46.0)
Hemoglobin: 11.1 g/dL — ABNORMAL LOW (ref 12.0–15.0)
Immature Granulocytes: 1 %
Lymphocytes Relative: 21 %
Lymphs Abs: 1.4 10*3/uL (ref 0.7–4.0)
MCH: 27.6 pg (ref 26.0–34.0)
MCHC: 32.3 g/dL (ref 30.0–36.0)
MCV: 85.6 fL (ref 80.0–100.0)
Monocytes Absolute: 0.4 10*3/uL (ref 0.1–1.0)
Monocytes Relative: 7 %
Neutro Abs: 4.6 10*3/uL (ref 1.7–7.7)
Neutrophils Relative %: 70 %
Platelets: 185 10*3/uL (ref 150–400)
RBC: 4.02 MIL/uL (ref 3.87–5.11)
RDW: 12.4 % (ref 11.5–15.5)
WBC: 6.6 10*3/uL (ref 4.0–10.5)
nRBC: 0 % (ref 0.0–0.2)

## 2020-08-09 LAB — BASIC METABOLIC PANEL
Anion gap: 6 (ref 5–15)
BUN: 13 mg/dL (ref 8–23)
CO2: 29 mmol/L (ref 22–32)
Calcium: 9.2 mg/dL (ref 8.9–10.3)
Chloride: 96 mmol/L — ABNORMAL LOW (ref 98–111)
Creatinine, Ser: 0.68 mg/dL (ref 0.44–1.00)
GFR, Estimated: >60 mL/min (ref >60)
Glucose, Bld: 100 mg/dL — ABNORMAL HIGH (ref 70–99)
Potassium: 3.9 mmol/L (ref 3.5–5.1)
Sodium: 131 mmol/L — ABNORMAL LOW (ref 135–145)

## 2020-08-09 LAB — PROTIME-INR
INR: 1 (ref 0.8–1.2)
Prothrombin Time: 12.5 seconds (ref 11.4–15.2)

## 2020-08-09 LAB — MAGNESIUM: Magnesium: 2 mg/dL (ref 1.7–2.4)

## 2020-08-09 LAB — POCT ACTIVATED CLOTTING TIME: Activated Clotting Time: 315 seconds

## 2020-08-09 SURGERY — ENDARTERECTOMY, CAROTID
Anesthesia: General | Site: Neck | Laterality: Right

## 2020-08-09 MED ORDER — ACETAMINOPHEN 10 MG/ML IV SOLN
1000.0000 mg | Freq: Once | INTRAVENOUS | Status: AC
  Administered 2020-08-09: 1000 mg via INTRAVENOUS

## 2020-08-09 MED ORDER — PHENYLEPHRINE HCL-NACL 10-0.9 MG/250ML-% IV SOLN
INTRAVENOUS | Status: DC | PRN
  Administered 2020-08-09: 20 ug/min via INTRAVENOUS

## 2020-08-09 MED ORDER — OXYCODONE-ACETAMINOPHEN 5-325 MG PO TABS: 1.0000 | ORAL_TABLET | ORAL | Status: DC | PRN

## 2020-08-09 MED ORDER — HEPARIN SODIUM (PORCINE) 1000 UNIT/ML IJ SOLN
INTRAMUSCULAR | Status: DC | PRN
  Administered 2020-08-09: 7000 [IU] via INTRAVENOUS

## 2020-08-09 MED ORDER — FENTANYL CITRATE (PF) 250 MCG/5ML IJ SOLN
INTRAMUSCULAR | Status: AC
  Filled 2020-08-09: qty 5

## 2020-08-09 MED ORDER — LIDOCAINE-EPINEPHRINE 1 %-1:100000 IJ SOLN
INTRAMUSCULAR | Status: AC
  Filled 2020-08-09: qty 1

## 2020-08-09 MED ORDER — ONDANSETRON HCL 4 MG/2ML IJ SOLN: 4.0000 mg | Freq: Once | INTRAMUSCULAR | Status: DC | PRN

## 2020-08-09 MED ORDER — HEMOSTATIC AGENTS (NO CHARGE) OPTIME
TOPICAL | Status: DC | PRN
  Administered 2020-08-09: 1 via TOPICAL

## 2020-08-09 MED ORDER — SODIUM CHLORIDE 0.9 % IV SOLN: INTRAVENOUS | Status: DC

## 2020-08-09 MED ORDER — ACETAMINOPHEN 10 MG/ML IV SOLN
INTRAVENOUS | Status: AC
  Filled 2020-08-09: qty 100

## 2020-08-09 MED ORDER — PROPOFOL 10 MG/ML IV BOLUS
INTRAVENOUS | Status: DC | PRN
  Administered 2020-08-09: 150 mg via INTRAVENOUS

## 2020-08-09 MED ORDER — MORPHINE SULFATE (PF) 2 MG/ML IV SOLN
2.0000 mg | INTRAVENOUS | Status: DC | PRN
  Administered 2020-08-09 – 2020-08-10 (×3): 2 mg via INTRAVENOUS
  Filled 2020-08-09 (×3): qty 1

## 2020-08-09 MED ORDER — 0.9 % SODIUM CHLORIDE (POUR BTL) OPTIME
TOPICAL | Status: DC | PRN
  Administered 2020-08-09: 1000 mL

## 2020-08-09 MED ORDER — LIDOCAINE HCL (PF) 1 % IJ SOLN
INTRAMUSCULAR | Status: AC
  Filled 2020-08-09: qty 30

## 2020-08-09 MED ORDER — FENTANYL CITRATE (PF) 100 MCG/2ML IJ SOLN
INTRAMUSCULAR | Status: AC
  Filled 2020-08-09: qty 2

## 2020-08-09 MED ORDER — SODIUM CHLORIDE 0.9 % IV SOLN: INTRAVENOUS | Status: DC | PRN

## 2020-08-09 MED ORDER — PANTOPRAZOLE SODIUM 40 MG PO TBEC
40.0000 mg | DELAYED_RELEASE_TABLET | Freq: Every day | ORAL | Status: DC
  Administered 2020-08-09 – 2020-08-10 (×2): 40 mg via ORAL
  Filled 2020-08-09 (×2): qty 1

## 2020-08-09 MED ORDER — VANCOMYCIN HCL 750 MG/150ML IV SOLN
750.0000 mg | Freq: Once | INTRAVENOUS | Status: AC
  Administered 2020-08-10: 750 mg via INTRAVENOUS
  Filled 2020-08-09: qty 150

## 2020-08-09 MED ORDER — FENTANYL CITRATE (PF) 100 MCG/2ML IJ SOLN
25.0000 ug | INTRAMUSCULAR | Status: DC | PRN
  Administered 2020-08-09 (×3): 25 ug via INTRAVENOUS

## 2020-08-09 MED ORDER — PROTAMINE SULFATE 10 MG/ML IV SOLN
INTRAVENOUS | Status: DC | PRN
  Administered 2020-08-09: 30 mg via INTRAVENOUS
  Administered 2020-08-09: 20 mg via INTRAVENOUS

## 2020-08-09 MED ORDER — SODIUM CHLORIDE 0.9 % IV SOLN
0.0125 ug/kg/min | INTRAVENOUS | Status: AC
  Administered 2020-08-09: .1 ug/kg/min via INTRAVENOUS
  Filled 2020-08-09: qty 2000

## 2020-08-09 MED ORDER — HYDRALAZINE HCL 20 MG/ML IJ SOLN: 5.0000 mg | INTRAMUSCULAR | Status: DC | PRN

## 2020-08-09 MED ORDER — GUAIFENESIN-DM 100-10 MG/5ML PO SYRP: 15.0000 mL | ORAL_SOLUTION | ORAL | Status: DC | PRN

## 2020-08-09 MED ORDER — ROCURONIUM BROMIDE 10 MG/ML (PF) SYRINGE
PREFILLED_SYRINGE | INTRAVENOUS | Status: DC | PRN
  Administered 2020-08-09: 40 mg via INTRAVENOUS

## 2020-08-09 MED ORDER — SODIUM CHLORIDE 0.9 % IV SOLN
INTRAVENOUS | Status: AC
  Filled 2020-08-09: qty 1.2

## 2020-08-09 MED ORDER — LACTATED RINGERS IV SOLN: INTRAVENOUS | Status: DC

## 2020-08-09 MED ORDER — PROPOFOL 10 MG/ML IV BOLUS
INTRAVENOUS | Status: AC
  Filled 2020-08-09: qty 20

## 2020-08-09 MED ORDER — SODIUM CHLORIDE 0.9 % IV SOLN
500.0000 mL | Freq: Once | INTRAVENOUS | Status: AC | PRN
  Administered 2020-08-09 (×2): 500 mL via INTRAVENOUS

## 2020-08-09 MED ORDER — ALUM & MAG HYDROXIDE-SIMETH 200-200-20 MG/5ML PO SUSP: 15.0000 mL | ORAL | Status: DC | PRN

## 2020-08-09 MED ORDER — ONDANSETRON HCL 4 MG/2ML IJ SOLN
INTRAMUSCULAR | Status: DC | PRN
  Administered 2020-08-09: 4 mg via INTRAVENOUS

## 2020-08-09 MED ORDER — POTASSIUM CHLORIDE CRYS ER 20 MEQ PO TBCR: 20.0000 meq | EXTENDED_RELEASE_TABLET | Freq: Every day | ORAL | Status: DC | PRN

## 2020-08-09 MED ORDER — SUGAMMADEX SODIUM 200 MG/2ML IV SOLN
INTRAVENOUS | Status: DC | PRN
  Administered 2020-08-09: 200 mg via INTRAVENOUS

## 2020-08-09 MED ORDER — AMISULPRIDE (ANTIEMETIC) 5 MG/2ML IV SOLN: 10.0000 mg | Freq: Once | INTRAVENOUS | Status: DC | PRN

## 2020-08-09 MED ORDER — MAGNESIUM SULFATE 2 GM/50ML IV SOLN: 2.0000 g | Freq: Every day | INTRAVENOUS | Status: DC | PRN

## 2020-08-09 MED ORDER — LABETALOL HCL 5 MG/ML IV SOLN: 10.0000 mg | INTRAVENOUS | Status: DC | PRN

## 2020-08-09 MED ORDER — VANCOMYCIN HCL IN DEXTROSE 1-5 GM/200ML-% IV SOLN
1000.0000 mg | Freq: Once | INTRAVENOUS | Status: AC
  Administered 2020-08-09: 1000 mg via INTRAVENOUS

## 2020-08-09 MED ORDER — DEXAMETHASONE SODIUM PHOSPHATE 10 MG/ML IJ SOLN
INTRAMUSCULAR | Status: DC | PRN
  Administered 2020-08-09: 10 mg via INTRAVENOUS

## 2020-08-09 MED ORDER — DEXTROSE 5 % IV SOLN
INTRAVENOUS | Status: AC
  Filled 2020-08-09: qty 1000

## 2020-08-09 MED ORDER — EPHEDRINE SULFATE-NACL 50-0.9 MG/10ML-% IV SOSY
PREFILLED_SYRINGE | INTRAVENOUS | Status: DC | PRN
  Administered 2020-08-09: 5 mg via INTRAVENOUS
  Administered 2020-08-09: 10 mg via INTRAVENOUS
  Administered 2020-08-09: 5 mg via INTRAVENOUS

## 2020-08-09 MED ORDER — CEFAZOLIN SODIUM-DEXTROSE 2-4 GM/100ML-% IV SOLN: 2.0000 g | Freq: Three times a day (TID) | INTRAVENOUS | Status: DC

## 2020-08-09 MED ORDER — CHLORHEXIDINE GLUCONATE 0.12 % MT SOLN
15.0000 mL | Freq: Once | OROMUCOSAL | Status: AC
  Administered 2020-08-09: 15 mL via OROMUCOSAL
  Filled 2020-08-09: qty 15

## 2020-08-09 MED ORDER — LIDOCAINE 2% (20 MG/ML) 5 ML SYRINGE
INTRAMUSCULAR | Status: DC | PRN
  Administered 2020-08-09: 60 mg via INTRAVENOUS

## 2020-08-09 MED ORDER — PHENOL 1.4 % MT LIQD: 1.0000 | OROMUCOSAL | Status: DC | PRN

## 2020-08-09 MED ORDER — LIDOCAINE 2% (20 MG/ML) 5 ML SYRINGE
INTRAMUSCULAR | Status: AC
  Filled 2020-08-09: qty 5

## 2020-08-09 MED ORDER — ONDANSETRON HCL 4 MG/2ML IJ SOLN: 4.0000 mg | Freq: Four times a day (QID) | INTRAMUSCULAR | Status: DC | PRN

## 2020-08-09 MED ORDER — METOPROLOL TARTRATE 5 MG/5ML IV SOLN: 2.0000 mg | INTRAVENOUS | Status: DC | PRN

## 2020-08-09 MED ORDER — DEXAMETHASONE SODIUM PHOSPHATE 10 MG/ML IJ SOLN
INTRAMUSCULAR | Status: AC
  Filled 2020-08-09: qty 1

## 2020-08-09 MED ORDER — ROCURONIUM BROMIDE 10 MG/ML (PF) SYRINGE
PREFILLED_SYRINGE | INTRAVENOUS | Status: AC
  Filled 2020-08-09: qty 10

## 2020-08-09 MED ORDER — ONDANSETRON HCL 4 MG/2ML IJ SOLN
INTRAMUSCULAR | Status: AC
  Filled 2020-08-09: qty 2

## 2020-08-09 MED ORDER — DOCUSATE SODIUM 100 MG PO CAPS
100.0000 mg | ORAL_CAPSULE | Freq: Every day | ORAL | Status: DC
  Administered 2020-08-10: 100 mg via ORAL
  Filled 2020-08-09: qty 1

## 2020-08-09 SURGICAL SUPPLY — 49 items
BAG DECANTER FOR FLEXI CONT (MISCELLANEOUS) ×2 IMPLANT
CANISTER SUCT 3000ML PPV (MISCELLANEOUS) ×2 IMPLANT
CATH ROBINSON RED A/P 18FR (CATHETERS) ×2 IMPLANT
CLIP VESOCCLUDE MED 24/CT (CLIP) ×2 IMPLANT
CLIP VESOCCLUDE SM WIDE 24/CT (CLIP) ×2 IMPLANT
COVER PROBE W GEL 5X96 (DRAPES) ×2 IMPLANT
COVER WAND RF STERILE (DRAPES) ×2 IMPLANT
DERMABOND ADVANCED (GAUZE/BANDAGES/DRESSINGS) ×1
DERMABOND ADVANCED .7 DNX12 (GAUZE/BANDAGES/DRESSINGS) ×1 IMPLANT
DRAIN CHANNEL 15F RND FF W/TCR (WOUND CARE) IMPLANT
ELECT REM PT RETURN 9FT ADLT (ELECTROSURGICAL) ×2
ELECTRODE REM PT RTRN 9FT ADLT (ELECTROSURGICAL) ×1 IMPLANT
EVACUATOR SILICONE 100CC (DRAIN) IMPLANT
GLOVE BIO SURGEON STRL SZ7.5 (GLOVE) ×2 IMPLANT
GLOVE SS BIOGEL STRL SZ 6.5 (GLOVE) IMPLANT
GLOVE SUPERSENSE BIOGEL SZ 6.5 (GLOVE) ×1
GLOVE SURG UNDER POLY LF SZ6.5 (GLOVE) ×4 IMPLANT
GOWN STRL REUS W/ TWL LRG LVL3 (GOWN DISPOSABLE) ×2 IMPLANT
GOWN STRL REUS W/ TWL XL LVL3 (GOWN DISPOSABLE) ×1 IMPLANT
GOWN STRL REUS W/TWL LRG LVL3 (GOWN DISPOSABLE) ×2
GOWN STRL REUS W/TWL XL LVL3 (GOWN DISPOSABLE) ×1
HEMOSTAT SNOW SURGICEL 2X4 (HEMOSTASIS) ×1 IMPLANT
INSERT FOGARTY SM (MISCELLANEOUS) IMPLANT
IV ADAPTER SYR DOUBLE MALE LL (MISCELLANEOUS) IMPLANT
KIT BASIN OR (CUSTOM PROCEDURE TRAY) ×2 IMPLANT
KIT SHUNT ARGYLE CAROTID ART 6 (VASCULAR PRODUCTS) ×2 IMPLANT
KIT TURNOVER KIT B (KITS) ×2 IMPLANT
NDL HYPO 25GX1X1/2 BEV (NEEDLE) IMPLANT
NDL SPNL 20GX3.5 QUINCKE YW (NEEDLE) IMPLANT
NEEDLE HYPO 25GX1X1/2 BEV (NEEDLE) IMPLANT
NEEDLE SPNL 20GX3.5 QUINCKE YW (NEEDLE) IMPLANT
NS IRRIG 1000ML POUR BTL (IV SOLUTION) ×6 IMPLANT
PACK CAROTID (CUSTOM PROCEDURE TRAY) ×2 IMPLANT
PAD ARMBOARD 7.5X6 YLW CONV (MISCELLANEOUS) ×4 IMPLANT
PATCH VASC XENOSURE 1CMX6CM (Vascular Products) ×1 IMPLANT
PATCH VASC XENOSURE 1X6 (Vascular Products) IMPLANT
POSITIONER HEAD DONUT 9IN (MISCELLANEOUS) ×2 IMPLANT
STOPCOCK 4 WAY LG BORE MALE ST (IV SETS) IMPLANT
SUT ETHILON 3 0 PS 1 (SUTURE) IMPLANT
SUT MNCRL AB 4-0 PS2 18 (SUTURE) ×2 IMPLANT
SUT PROLENE 6 0 BV (SUTURE) ×8 IMPLANT
SUT SILK 3 0 (SUTURE)
SUT SILK 3-0 18XBRD TIE 12 (SUTURE) IMPLANT
SUT VIC AB 3-0 SH 27 (SUTURE) ×1
SUT VIC AB 3-0 SH 27X BRD (SUTURE) ×1 IMPLANT
SYR CONTROL 10ML LL (SYRINGE) IMPLANT
TOWEL GREEN STERILE (TOWEL DISPOSABLE) ×2 IMPLANT
TUBING ART PRESS 48 MALE/FEM (TUBING) IMPLANT
WATER STERILE IRR 1000ML POUR (IV SOLUTION) ×2 IMPLANT

## 2020-08-09 NOTE — Progress Notes (Signed)
PROGRESS NOTE    Joy Williams  DXA:[id number] DOB: 1950-08-27 DOA: 08/06/2020 PCP: Lorrene Reid, PA-C     Brief Narrative:  Patient is a 70 year old female history of chronic diastolic CHF, COPD, renal syndrome, telangiectasias, severe mitral regurg, hypothyroidism presented with dizziness and left arm incoordination in addition to some gait abnormalities.  Patient also noted headache on presentation.  Patient noted to have been on aspirin at home which she stopped about a week ago as she states she was instructed to do so per her cardiologist. Patient admitted for stroke work-up.   Assessment & Plan:   Principal Problem:   TIA (transient ischemic attack) Active Problems:   Chronic diastolic congestive heart failure (HCC)   Chronic obstructive pulmonary disease (HCC)   Hypothyroidism   Tobacco abuse   Anemia   Symptomatic stenosis of right carotid artery   complicated migraine versus TIA/symptomatic right carotid artery disease -Patient had presented with evaluation of sudden onset dizziness, left upper extremity incoordination, headache, gait abnormalities which lasted about 25 to 30 minutes and subsequently resolved. -Head CT done negative for any acute abnormalities. -MRI brain negative for any acute abnormalities, multiple scattered remote lacunar infarcts involving bilateral cerebellar hemispheres, left thalamus, right caudate. 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 -2D echo with EF 65 to 70%, NWMA, mild LVH, moderate left atrial size dilated, mild MVR, no source of emboli noted.  -Risk factor modification  -Continue statin, Plavix, aspirin.   -Neurology consulted and are following.   -Vascular surgery, Dr Donzetta Matters consulted and patient scheduled for right CEA  tomorrow, 08/09/2020. -Per neurology and vascular surgery.   Chronic diastolic heart failure -Compensated.   -Continue home regimen Toprol-XL.   -Monitor volume status closely.   COPD Stable.  Home trelegy.  Hypothyroidism -TSH 0.603 (06/15/2020 ) -Continue Synthroid.   -Outpatient follow-up.    Tobacco abuse Tobacco cessation.  Nicotine patch. -encourage cessation  Hyperlipidemia -LDL 50.   -Continue statin.    Coronary artery disease/elevated troponin -Patient noted to have reported some chest tightness at the same time as a neurological symptoms which has since resolved. -EKG with no ischemic changes noted. -Cardiac enzymes elevated but flattened.   -2D echo with EF 65 to 70%, NWMA, mild LVH, normal right ventricular systolic function, moderately dilated left atrial size, mild MVR.   -No further cardiac work-up needed.   Continue aspirin, Plavix, Toprol-XL statin -Outpatient follow-up.   DVT prophylaxis: Lovenox Code Status: Full       Consultants:   Neurology  Vascular surgery  Procedures:  CT head 08/06/2020  CT angiogram head and neck 08/06/2020  MRI brain 08/06/2020  2D echo  08/07/2020    Subjective: Has questions about the procedure   Objective: Vitals:   08/08/20 1951 08/08/20 2321 08/09/20 0403 08/09/20 0950  BP: (!) 111/58 101/81 (!) 103/55   Pulse: 63 68 62   Resp: 18 16 18    Temp: 97.7 F (36.5 C) 97.9 F (36.6 C) 98.2 F (36.8 C)   TempSrc: Oral Oral Oral   SpO2: 97% 100% 97%   Weight:   63.3 kg 63.3 kg  Height:    5' 7.01" (1.702 m)    Intake/Output Summary (Last 24 hours) at 08/09/2020 1242 Last data filed at 08/09/2020 0559 Gross per 24 hour  Intake --  Output 2900 ml  Net -2900 ml   Filed Weights   08/08/20 0410 08/09/20 0403 08/09/20 0950  Weight: 65 kg 63.3 kg 63.3 kg    Examination:   General: Appearance:    Well developed, well nourished female in no acute distress     Lungs:     Clear to auscultation  bilaterally, respirations unlabored  Heart:    Normal heart rate. Normal rhythm.    MS:   All extremities are intact.   Neurologic:   Awake, alert    Data Reviewed: I have personally reviewed following labs and imaging studies  CBC: Recent Labs  Lab 08/06/20 1800 08/08/20 0558 08/09/20 0059  WBC 9.9 6.1 6.6  NEUTROABS 8.0*  --  4.6  HGB 11.4* 11.3* 11.1*  HCT 35.6* 35.0* 34.4*  MCV 87.3 85.0 85.6  PLT 212 192 846    Basic Metabolic Panel: Recent Labs  Lab 08/06/20 1800 08/08/20 0558 08/09/20 0059  NA 135 132* 131*  K 4.0 4.1 3.9  CL 102 99 96*  CO2 27 27 29   GLUCOSE 104* 90 100*  BUN 14 10 13   CREATININE 0.72 0.69 0.68  CALCIUM 9.0 8.9 9.2  MG  --  1.8 2.0    GFR: Estimated Creatinine Clearance: 64.5 mL/min (by C-G formula based on SCr of 0.68 mg/dL).  Liver Function Tests: Recent Labs  Lab 08/06/20 1800  AST 29  ALT 20  ALKPHOS 64  BILITOT 0.3  PROT 6.3*  ALBUMIN 3.5    CBG: No results for input(s): GLUCAP in the last 168 hours.   Recent Results (from the past 240 hour(s))  Resp Panel by RT-PCR (Flu A&B, Covid) Nasopharyngeal Swab     Status: None   Collection Time: 08/07/20 12:08 PM   Specimen: Nasopharyngeal Swab; Nasopharyngeal(NP) swabs in vial transport medium  Result Value Ref Range Status   SARS Coronavirus 2 by RT PCR NEGATIVE NEGATIVE Final    Comment: (NOTE) SARS-CoV-2 target nucleic acids are NOT DETECTED.  The SARS-CoV-2 RNA is generally detectable in upper respiratory specimens during the acute phase of infection. The lowest concentration of SARS-CoV-2 viral copies this assay can detect is 138 copies/mL. A negative result does not preclude SARS-Cov-2 infection and should not be used as the sole basis for treatment or other patient management decisions. A negative result may occur with  improper specimen collection/handling, submission of specimen other than nasopharyngeal swab, presence of viral mutation(s) within the areas  targeted by this assay, and inadequate number of viral copies(<138 copies/mL). A negative result must be combined with clinical observations, patient history, and epidemiological information. The expected result is Negative.  Fact Sheet for Patients:  EntrepreneurPulse.com.au  Fact Sheet for Healthcare Providers:  IncredibleEmployment.be  This test is no t yet approved or cleared by the [place] FDA and  has been authorized for detection and/or diagnosis of SARS-CoV-2 by FDA under an Emergency Use Authorization (EUA). This EUA will remain  in effect (meaning this test can be used) for the duration of the COVID-19 declaration under Section 564(b)(1) of the Act, 21 U.S.C.section 360bbb-3(b)(1), unless the authorization is terminated  or revoked sooner.       Influenza A by PCR NEGATIVE NEGATIVE Final   Influenza B by PCR NEGATIVE NEGATIVE Final    Comment: (NOTE) The Xpert Xpress SARS-CoV-2/FLU/RSV plus assay is intended as an aid in the diagnosis of influenza from Nasopharyngeal swab specimens and should not be used as a sole basis for  treatment. Nasal washings and aspirates are unacceptable for Xpert Xpress SARS-CoV-2/FLU/RSV testing.  Fact Sheet for Patients: EntrepreneurPulse.com.au  Fact Sheet for Healthcare Providers: IncredibleEmployment.be  This test is not yet approved or cleared by the [place] FDA and has been authorized for detection and/or diagnosis of SARS-CoV-2 by FDA under an Emergency Use Authorization (EUA). This EUA will remain in effect (meaning this test can be used) for the duration of the COVID-19 declaration under Section 564(b)(1) of the Act, 21 U.S.C. section 360bbb-3(b)(1), unless the authorization is terminated or revoked.  Performed at [facility], [place] [address], [place], [place] [address]          Radiology Studies: ECHOCARDIOGRAM COMPLETE  Result  Date: 08/07/2020    ECHOCARDIOGRAM REPORT   Patient Name:   Joy Williams Conely Date of Exam: 08/07/2020 Medical Rec #:  [id number]        Height:       67.0 in Accession #:    [id number]       Weight:       145.0 lb Date of Birth:  [birth year]        BSA:          1.764 m Patient Age:    25 years         BP:           117/58 mmHg Patient Gender: F                HR:           77 bpm. Exam Location:  Inpatient Procedure: Cardiac Doppler and Color Doppler Indications:    TIA  History:        Patient has prior history of Echocardiogram examinations, most                 recent 02/25/2019.  Sonographer:    Merrie Roof RDCS Referring Phys: [id number] [place]  1. Left ventricular ejection fraction, by estimation, is 65 to 70%. The left ventricle has normal function. The left ventricle has no regional wall motion abnormalities. There is mild left ventricular hypertrophy.  2. Right ventricular systolic function is normal. The right ventricular size is normal.  3. Left atrial size was moderately dilated.  4. Mild mitral valve regurgitation. Moderate mitral annular calcification.  5. The aortic valve is tricuspid. Aortic valve regurgitation is not visualized. Mild aortic valve sclerosis is present, with no evidence of aortic valve stenosis.  6. The inferior vena cava is normal in size with greater than 50% respiratory variability, suggesting right atrial pressure of 3 mmHg. Comparison(s): The left ventricular function is unchanged. FINDINGS  Left Ventricle: Left ventricular ejection fraction, by estimation, is 65 to 70%. The left ventricle has normal function. The left ventricle has no regional wall motion abnormalities. The left ventricular internal cavity size was normal in size. There is  mild left ventricular hypertrophy. Right Ventricle: The right ventricular size is normal. Right vetricular wall thickness was not assessed. Right ventricular systolic function is normal. Left Atrium: Left atrial size was  moderately dilated. Right Atrium: Right atrial size was normal in size. Pericardium: There is no evidence of pericardial effusion. Mitral Valve: Moderate mitral annular calcification. Mild mitral valve regurgitation. MV peak gradient, 5.6 mmHg. The mean mitral valve gradient is 2.0 mmHg. Tricuspid Valve: The tricuspid valve is normal in structure. Tricuspid valve regurgitation is mild. Aortic Valve: The aortic valve is tricuspid. Aortic valve regurgitation is not visualized. Mild aortic valve sclerosis  is present, with no evidence of aortic valve stenosis. Aortic valve mean gradient measures 6.0 mmHg. Aortic valve peak gradient measures 12.7 mmHg. Aortic valve area, by VTI measures 2.07 cm. Pulmonic Valve: The pulmonic valve was normal in structure. Pulmonic valve regurgitation is trivial. Aorta: The aortic root and ascending aorta are structurally normal, with no evidence of dilitation. Venous: The inferior vena cava is normal in size with greater than 50% respiratory variability, suggesting right atrial pressure of 3 mmHg. IAS/Shunts: No atrial level shunt detected by color flow Doppler.  LEFT VENTRICLE PLAX 2D LVIDd:         3.50 cm LVIDs:         2.30 cm LV PW:         1.30 cm LV IVS:        1.30 cm LVOT diam:     1.80 cm LV SV:         77 LV SV Index:   43 LVOT Area:     2.54 cm  RIGHT VENTRICLE          IVC RV Basal diam:  3.60 cm  IVC diam: 1.20 cm LEFT ATRIUM           Index       RIGHT ATRIUM           Index LA diam:      3.60 cm 2.04 cm/m  RA Area:     16.00 cm LA Vol (A4C): 78.3 ml 44.39 ml/m RA Volume:   38.80 ml  22.00 ml/m  AORTIC VALVE AV Area (Vmax):    2.16 cm AV Area (Vmean):   2.12 cm AV Area (VTI):     2.07 cm AV Vmax:           178.00 cm/s AV Vmean:          121.000 cm/s AV VTI:            0.370 m AV Peak Grad:      12.7 mmHg AV Mean Grad:      6.0 mmHg LVOT Vmax:         151.00 cm/s LVOT Vmean:        101.000 cm/s LVOT VTI:          0.301 m LVOT/AV VTI ratio: 0.81  AORTA Ao Root diam:  3.10 cm Ao Asc diam:  3.10 cm MITRAL VALVE MV Area (PHT): 3.17 cm    SHUNTS MV Area VTI:   2.43 cm    Systemic VTI:  0.30 m MV Peak grad:  5.6 mmHg    Systemic Diam: 1.80 cm MV Mean grad:  2.0 mmHg MV Vmax:       1.18 m/s MV Vmean:      69.7 cm/s MV Decel Time: 239 msec MV E velocity: 98.10 cm/s MV A velocity: 89.50 cm/s MV E/A ratio:  1.10 Dorris Carnes MD Electronically signed by Dorris Carnes MD Signature Date/Time: 08/07/2020/5:00:45 PM    Final         Scheduled Meds: . [MAR Hold] aspirin EC  81 mg Oral Daily  . [MAR Hold] atorvastatin  20 mg Oral q1800  . [MAR Hold] clopidogrel  75 mg Oral Daily  . [MAR Hold] cyanocobalamin  1,000 mcg Subcutaneous Daily  . [MAR Hold] enoxaparin (LOVENOX) injection  40 mg Subcutaneous Q24H  . [MAR Hold] fluticasone furoate-vilanterol  1 puff Inhalation Daily   And  . [MAR Hold] umeclidinium bromide  1 puff Inhalation Daily  . [  MAR Hold] levothyroxine  175 mcg Oral QAC breakfast  . [MAR Hold] metoprolol succinate  12.5 mg Oral QPM  . [MAR Hold] nicotine  7 mg Transdermal Daily   Continuous Infusions: . lactated ringers 10 mL/hr at 08/09/20 1201  . vancomycin       LOS: 2 days    Time spent: 35 minutes    Geradine Girt, DO [facility]   To contact the attending provider between 7A-7P or the covering provider during after hours 7P-7A, please log into the web site [email or web address] and access using universal [place] password for that web site. If you do not have the password, please call the hospital operator.  08/09/2020, 12:42 PM

## 2020-08-09 NOTE — Anesthesia Procedure Notes (Signed)
Arterial Line Insertion Start/End3/23/2022 10:00 AM, 08/09/2020 10:10 AM Performed by: Reece Agar, CRNA, CRNA  Patient location: Pre-op. Preanesthetic checklist: patient identified, IV checked, site marked, risks and benefits discussed, surgical consent, monitors and equipment checked, pre-op evaluation, timeout performed and anesthesia consent Lidocaine 1% used for infiltration Left, radial was placed Catheter size: 20 G Hand hygiene performed  and maximum sterile barriers used   Attempts: 1 Procedure performed without using ultrasound guided technique. Following insertion, dressing applied and Biopatch. Post procedure assessment: normal and unchanged  Patient tolerated the procedure well with no immediate complications.

## 2020-08-09 NOTE — Anesthesia Preprocedure Evaluation (Addendum)
Anesthesia Evaluation  Patient identified by MRN, date of birth, ID band Patient awake    Reviewed: Allergy & Precautions, NPO status , Patient's Chart, lab work & pertinent test results  History of Anesthesia Complications (+) PONV and history of anesthetic complications  Airway Mallampati: I  TM Distance: >3 FB Neck ROM: Full    Dental no notable dental hx.    Pulmonary asthma , COPD,  COPD inhaler, former smoker,    Pulmonary exam normal breath sounds clear to auscultation       Cardiovascular + CAD, + Past MI and +CHF  Normal cardiovascular exam+ Valvular Problems/Murmurs  Rhythm:Regular Rate:Normal  ECG: NSR, rate 63  ECHO: 1. Left ventricular ejection fraction, by estimation, is 65 to 70%. The left ventricle has normal function. The left ventricle has no regional wall motion abnormalities. There is mild left ventricular hypertrophy. 2. Right ventricular systolic function is normal. The right ventricular size is normal. 3. Left atrial size was moderately dilated. 4. Mild mitral valve regurgitation. Moderate mitral annular calcification. 5. The aortic valve is tricuspid. Aortic valve regurgitation is not visualized. Mild aortic valve sclerosis is present, with no evidence of aortic valve stenosis. 6. The inferior vena cava is normal in size with greater than 50% respiratory variability, suggesting right atrial pressure of 3 mmHg.   Neuro/Psych  Headaches, TIAnegative psych ROS   GI/Hepatic negative GI ROS, Neg liver ROS,   Endo/Other  Hypothyroidism   Renal/GU negative Renal ROS     Musculoskeletal negative musculoskeletal ROS (+)   Abdominal   Peds  Hematology  (+) anemia , HLD   Anesthesia Other Findings SYMPTOMATIC STENOSIS OF RIGHT CAROTID ARTERY  Reproductive/Obstetrics                            Anesthesia Physical Anesthesia Plan  ASA: III  Anesthesia Plan: General    Post-op Pain Management:    Induction: Intravenous  PONV Risk Score and Plan: 4 or greater and Ondansetron, Dexamethasone, Treatment may vary due to age or medical condition and Amisulpride  Airway Management Planned: Oral ETT  Additional Equipment: Arterial line  Intra-op Plan:   Post-operative Plan: Extubation in OR  Informed Consent: I have reviewed the patients History and Physical, chart, labs and discussed the procedure including the risks, benefits and alternatives for the proposed anesthesia with the patient or authorized representative who has indicated his/her understanding and acceptance.     Dental advisory given  Plan Discussed with: CRNA  Anesthesia Plan Comments:        Anesthesia Quick Evaluation

## 2020-08-09 NOTE — Interval H&P Note (Signed)
History and Physical Interval Note:  08/09/2020 11:48 AM  Joy Williams  has presented today for surgery, with the diagnosis of SYMPTOMATIC STENOSIS OF RIGHT CAROTID ARTERY.  The various methods of treatment have been discussed with the patient and family. After consideration of risks, benefits and other options for treatment, the patient has consented to  Procedure(s): RIGHT CAROTID ENDARTERECTOMY (Right) as a surgical intervention.  The patient's history has been reviewed, patient examined, no change in status, stable for surgery.  I have reviewed the patient's chart and labs.  Questions were answered to the patient's satisfaction.     Servando Snare

## 2020-08-09 NOTE — Transfer of Care (Signed)
Immediate Anesthesia Transfer of Care Note  Patient: Joy Williams  Procedure(s) Performed: RIGHT CAROTID ENDARTERECTOMY (Right Neck)  Patient Location: PACU  Anesthesia Type:General  Level of Consciousness: awake, alert , oriented and patient cooperative  Airway & Oxygen Therapy: Patient Spontanous Breathing and Patient connected to face mask oxygen  Post-op Assessment: Report given to RN, Post -op Vital signs reviewed and stable, Patient moving all extremities X 4 and Patient able to stick tongue midline  Post vital signs: Reviewed and stable  Last Vitals:  Vitals Value Taken Time  BP    Temp    Pulse 79 08/09/20 1411  Resp 20 08/09/20 1411  SpO2 100 % 08/09/20 1411  Vitals shown include unvalidated device data.  Last Pain:  Vitals:   08/09/20 0725  TempSrc:   PainSc: 0-No pain         Complications: No complications documented.

## 2020-08-09 NOTE — Progress Notes (Signed)
   S/P right CEA CC: right  jaw numbness  Incision soft without hematoma Speech clear, without tongue deviation and no facial droop Moving extremities. Stable post op  Roxy Horseman PA-C

## 2020-08-09 NOTE — Op Note (Signed)
    Patient name: Joy Williams MRN: [id number] DOB: [birth year] Sex: female  08/09/2020 Pre-operative Diagnosis: symptomatic right ica stenosis Post-operative diagnosis:  Same Surgeon:  Erlene Quan C. Donzetta Matters, MD Assistant: Risa Grill, PA Procedure Performed:  Right carotid endarterectomy with bovine pericardial patch angioplasty and 12 French shunt neuro protection  Indications: 70 year old female with recent history of left upper extremity weakness with associated dizziness and imbalance concern for TIA with high-grade carotid stenosis.  She is now indicated for carotid endarterectomy.  Assistant was necessary to expedite the case.  Findings: The common femoral artery on the right was actually quite large and patulous there was heavy calcification extending from the common carotid all the way up approximately 1 inch above the bifurcation.  The external carotid artery itself was not heavily diseased.  After endarterectomy we did have good backbleeding.  Endarterectomy and patch angioplasty were performed with shunt in place.  There was expected low resistance waveform signal in the distal internal carotid artery at completion.  Patient was neurologically intact upon awakening from anesthesia.   Procedure:  The patient was identified in the holding area and taken to the operating where she is placed supine operative table and general anesthesia was induced.  She was sterilely prepped and draped in the right neck and chest in usual fashion antibiotics were ministered a timeout was called.  We began with longitudinal incision along the anterior border the sternocleidomastoid.  We dissected down through the skin subcutaneous tissue and platysma until we identified the common carotid artery and the patient was fully heparinized.  This was encircled with umbilical tape.  We divided the facial vein to expose the carotid bifurcation.  We placed a vessel loop around the external carotid artery.  We identified  the hypoglossal nerve.  Under this the artery became more normal and a vessel loop was placed around this.  ACT was confirmed greater than 300.  We prepared a bovine pericardial patch and 12 French shunt.  The internal carotid artery was clamped followed by the common carotid and external carotid arteries.  These were opened longitudinally.  Shunt was placed distally allowed to backbleed and then placed proximally.  Flow was confirmed with Doppler.  We proceeded with endarterectomy including eversion of the external.  There was good feathering distally in the internal carotid artery.  A bovine pericardial patch was then trimmed to size and sewn in place with 6-0 Prolene suture.  Prior completion we removed our shunt.  We then allowed backbleeding from the ICA as well as flushing from the ECA and common with the ICA clamp.  We thoroughly irrigated with heparinized saline.  We completed our patch.  We then released our external clamp followed by common clamp.  We had bleeding from multiple sites which were repaired with 6-0 Prolene suture.  We then released our internal carotid artery clamp.  Doppler demonstrated good flow distally.  Satisfied we obtain hemostasis meticulously.  We did administer 50 mg of protamine.  We thoroughly irrigated the wound and closed in layers of Vicryl at the level of the platysma and skin with Monocryl.  Dermabond placed at the level of skin.  She was then awakened from anesthesia having tolerated procedure without any complication.  She was neurologically intact transferred to the PACU in stable condition.  EBL: 250 cc  Brandon C. Donzetta Matters, MD [facility] [place] Office: [phone] Pager: [phone]

## 2020-08-09 NOTE — Progress Notes (Signed)
PT Cancellation Note  Patient Details Name: Joy Williams MRN: [id number] DOB: 01-Apr-1951   Cancelled Treatment:    Reason Eval/Treat Not Completed: Patient at procedure or test/unavailable currently in surgery- will attempt to try back later if time/schedule allow and if she is medically ready following procedure.    Windell Norfolk, DPT, PN1   Supplemental Physical Therapist [facility]    Pager [phone] Acute Rehab Office [phone]

## 2020-08-09 NOTE — Anesthesia Procedure Notes (Signed)
Procedure Name: Intubation Date/Time: 08/09/2020 12:15 PM Performed by: Myna Bright, CRNA Pre-anesthesia Checklist: Patient identified, Emergency Drugs available, Suction available and Patient being monitored Patient Re-evaluated:Patient Re-evaluated prior to induction Oxygen Delivery Method: Circle system utilized Preoxygenation: Pre-oxygenation with 100% oxygen Induction Type: IV induction Ventilation: Mask ventilation without difficulty Laryngoscope Size: Mac and 3 Grade View: Grade I Tube type: Oral Tube size: 7.0 mm Number of attempts: 1 Airway Equipment and Method: Stylet Placement Confirmation: ETT inserted through vocal cords under direct vision,  positive ETCO2 and breath sounds checked- equal and bilateral Secured at: 21 cm Tube secured with: Tape Dental Injury: Teeth and Oropharynx as per pre-operative assessment

## 2020-08-09 NOTE — Progress Notes (Signed)
STROKE TEAM PROGRESS NOTE   INTERVAL HISTORY Daughter at bedside.  Patient lying in bed, no acute event overnight, pending right CEA today with [name].  Answered patient and her daughter's questions to their satisfaction.  Vitals:   08/08/20 1951 08/08/20 2321 08/09/20 0403 08/09/20 0950  BP: (!) 111/58 101/81 (!) 103/55   Pulse: 63 68 62   Resp: 18 16 18    Temp: 97.7 F (36.5 C) 97.9 F (36.6 C) 98.2 F (36.8 C)   TempSrc: Oral Oral Oral   SpO2: 97% 100% 97%   Weight:   63.3 kg 63.3 kg  Height:    5' 7.01" (1.702 m)   CBC:  Recent Labs  Lab 08/06/20 1800 08/08/20 0558 08/09/20 0059  WBC 9.9 6.1 6.6  NEUTROABS 8.0*  --  4.6  HGB 11.4* 11.3* 11.1*  HCT 35.6* 35.0* 34.4*  MCV 87.3 85.0 85.6  PLT 212 192 062   Basic Metabolic Panel:  Recent Labs  Lab 08/08/20 0558 08/09/20 0059  NA 132* 131*  K 4.1 3.9  CL 99 96*  CO2 27 29  GLUCOSE 90 100*  BUN 10 13  CREATININE 0.69 0.68  CALCIUM 8.9 9.2  MG 1.8 2.0   Lipid Panel:  Recent Labs  Lab 08/07/20 0050  CHOL 116  TRIG 102  HDL 46  CHOLHDL 2.5  VLDL 20  LDLCALC 50   HgbA1c:  Recent Labs  Lab 08/07/20 0439  HGBA1C 5.6   Urine Drug Screen:  Recent Labs  Lab 08/06/20 1836  LABOPIA NONE DETECTED  COCAINSCRNUR NONE DETECTED  LABBENZ NONE DETECTED  AMPHETMU NONE DETECTED  THCU NONE DETECTED  LABBARB NONE DETECTED    Alcohol Level No results for input(s): ETH in the last 168 hours.  IMAGING past 24 hours CT Angio Head/Neck W or Wo Contrast  Result Date: 08/06/2020 IMPRESSION:  1. Negative CTA for large vessel occlusion.  2. Bulky calcified plaque about the proximal ICAs bilaterally, with associated stenoses of up to 65% on the right and 50% on the left.  3. Predominant fetal type origin of the PCAs with overall diminutive vertebrobasilar system. Hypoplastic right vertebral artery largely terminates in PICA.  4. Few mildly enlarged mediastinal lymph nodes with asymmetric soft tissue fullness at the  left hilum and post treatment changes within the partially visualized left lung. Findings are overall similar as compared to previous CTA from 2019.  5. 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   CT HEAD WO CONTRAST  Result Date: 08/06/2020 IMPRESSION: No evidence of acute intracranial hemorrhage or acute demarcated cortical infarction. A lacunar infarct within the left basal ganglia is new as compared to the brain MRI of 03/12/2018, but otherwise age indeterminate. Known small infarcts within the left thalamus and left cerebellar hemisphere were better appreciated on the prior brain MRI of 03/12/2018.   CT Angio Neck W and/or Wo Contrast  Result Date: 08/06/2020 IMPRESSION:  1. Negative CTA for large vessel occlusion.  2. Bulky calcified plaque about the proximal ICAs bilaterally, with associated stenoses of up to 65% on the right and 50% on the left.  3. Predominant fetal type origin of the PCAs with overall diminutive vertebrobasilar system. Hypoplastic right vertebral artery largely terminates in PICA.  4. Few mildly enlarged mediastinal lymph nodes with asymmetric soft tissue fullness at the left hilum and post treatment changes within the partially visualized left lung. Findings are overall similar as compared to previous CTA from  2019.  5. 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   MR BRAIN WO CONTRAST  Result Date: 08/07/2020 IMPRESSION:  1. No acute intracranial abnormality.  2. Multiple scattered remote lacunar infarcts involving the bilateral cerebellar hemispheres, left thalamus, and right caudate.    PHYSICAL EXAM General: Laying comfortably in bed; in no acute distress.  HENT: Normal oropharynx and mucosa. Normal external appearance of ears and nose.  Neck: Supple, no pain or tenderness  CV: No JVD. No peripheral edema.  Pulmonary: Symmetric Chest rise. Normal respiratory  effort.  Abdomen: Soft to touch, non-tender.  Ext: No cyanosis, edema, or deformity  Skin: Telangiectasias. Normal palpation of skin.  Musculoskeletal: Normal digits and nails by inspection. No clubbing.   Mental status/Cognition: Alert, oriented to self, place, month and year, good attention.  Speech/language: Fluent, comprehension intact, object naming intact, repetition intact.  Cranial nerves:   CN II Pupils equal and reactive to light, no VF deficits    CN III,IV,VI EOM intact, no gaze preference or deviation, no nystagmus    CN V normal sensation in V1, V2, and V3 segments bilaterally    CN VII no asymmetry, no nasolabial fold flattening    CN VIII normal hearing to speech    CN IX & X normal palatal elevation, no uvular deviation    CN XI 5/5 head turn and 5/5 shoulder shrug bilaterally    CN XII midline tongue protrusion    Motor:  Muscle bulk: normal, tone normal, pronator drift none tremor yes end movement tremors in BL arms.  ASSESSMENT/PLAN Joy Williams is a 70 y.o. female with history of chronic diastolic CHF, COPD, raynauds syndrome, telangectasia, severe mitral regurgitation, hypothyroidism, history of lung cancer who presents with dizziness and left arm incoordination, headache.  CT headshowing no evidence of acute intracranial hemorrhage or acute demarcated cortical infarction. A lacunar infarct within the left basal ganglia is new compared to brain MRI done in October 2019 but otherwise age indeterminate. 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     Possible TIA with symptomatic right ICA of 65% stenosis. Complicated migraine is also in DDx.   Recurrent episode similar to 02/2018 of dizziness, imbalance, left arm incoordination, not in control and headache.  CT head: No acute finding.  Age indeterminate lacunar infarct within the left basal ganglia. Known small  infarcts within the left thalamus and left cerebellar.  CTA head & neck: no LVO, Bulky calcified plaque about the proximal ICAs bilaterally, with associated stenoses of up to 65% on the right and 50% on the left.  MRI: No acute intracranial abnormality.  Multiple scattered remote lacunar infarcts involving the bilateral cerebellar hemispheres, left thalamus, and right caudate.  2D Echo EF 65-70%, mild mitral valve regurgitation  LDL 50  HgbA1c 5.6  VTE prophylaxis - Lovenox 40mg  daily  No antithrombotic prior to admission, now on aspirin 81 mg daily and clopidogrel 75 mg daily. Will continue DAPT for 3 weeks and then ASA alone.  Therapy recommendations:  No OT follow up, Outpatient PT (for higher level balance activities)  Disposition:  pending  Bilateral ICA stenosis  02/2018 CTA neck right ICA heavily calcified plaque with high grade stenosis but with streak artifact. Left ICA heavily calcified plaque without significant stenosis  02/2018 CUS b/l 1-39% stenosis  This admission CTA neck: right ICA of 65% stenosis and left  ICA 50% stenosis  Vascularsurgery consulted - [name] will do right CEA today  On aspirin 81mg  and Plavix 75mg   Hx of TIA vs. Complicated migarine  89/3734 admitted for episode of dizziness, imbalance gait, left arm weakness numbness and headache.  Status post TPA.  CT head left caudate head hypodensity.  CT head and neck bilateral ICA heavily calcified plaque high-grade stenosis of right ICA, but carotid Doppler unremarkable.  MRI no acute infarct.  EF 55-60%.  LDL 108, A1c 5.3.  Concerning for TIA versus complicated migraine, discharged on DAPT for 3 weeks as well as statin.  This admission MRI revealed  Multiple scattered remote lacunar infarcts involving the bilateral cerebellar hemispheres, left thalamus, and right caudate.   Hypertension  Home meds: metoprolol 12.5mg  dialy  Stable . BP goal 130-160 before right CEA . Avoid hypotension . negative  for orthostatic hypotension: lying 120/62, sitting 111/65, standing 108/62, standing at 3 minutes 117/58 . Long-term BP goal normotensive  Hyperlipidemia  Home meds:  Atorvastatin 20mg  daily, resumed in hospital  LDL 50, goal < 70  Continue statin at discharge  Other Stroke Risk Factors  Advanced Age >/= 12   Cigarette smoker advised to stop smoking  ETOH use, alcohol level <10, advised to drink no more than 1-2 drink(s) a day  Coronary artery disease  Severe mitral regurgitation s/p bioprosthetic MVR in 2018  CHF: Chronic diastolic heart failure  Other Active Problems  Hypothyroidism: synthroid 175 mcg daily  History of lung cancer 2016, s/p resection and chemotherapy. Treatment at [facility]  Tinnitus  Recent Ear pain and fullness: ear drops   Hospital day # 2  I had long discussion with patient and daughter at bedside, updated pt current condition, treatment plan and potential prognosis, and answered all the questions.  They expressed understanding and appreciation.    Rosalin Hawking, MD PhD Stroke Neurology 08/09/2020 2:15 PM      To contact Stroke Continuity provider, please refer to [email or web address] After hours, contact General Neurology

## 2020-08-09 NOTE — Anesthesia Postprocedure Evaluation (Signed)
Anesthesia Post Note  Patient: Joy Williams  Procedure(s) Performed: RIGHT CAROTID ENDARTERECTOMY (Right Neck)     Patient location during evaluation: PACU Anesthesia Type: General Level of consciousness: awake Pain management: pain level controlled Vital Signs Assessment: post-procedure vital signs reviewed and stable Respiratory status: spontaneous breathing, nonlabored ventilation, respiratory function stable and patient connected to nasal cannula oxygen Cardiovascular status: blood pressure returned to baseline and stable Postop Assessment: no apparent nausea or vomiting Anesthetic complications: no   No complications documented.  Last Vitals:  Vitals:   08/09/20 1654 08/09/20 1922  BP: 106/90 (!) 101/50  Pulse: 70 73  Resp: 14 18  Temp: 36.4 C 36.6 C  SpO2: 100% 98%    Last Pain:  Vitals:   08/09/20 2000  TempSrc:   PainSc: 6                  Jessup Ogas P Greer Wainright

## 2020-08-09 NOTE — Progress Notes (Signed)
Pt transferred to 4E, CHG completed, tele initiated , R neck incision level 0 CDI.

## 2020-08-10 ENCOUNTER — Other Ambulatory Visit: Payer: Self-pay | Admitting: Neurology

## 2020-08-10 ENCOUNTER — Encounter (HOSPITAL_COMMUNITY): Payer: Self-pay | Admitting: Vascular Surgery

## 2020-08-10 DIAGNOSIS — G459 Transient cerebral ischemic attack, unspecified: Secondary | ICD-10-CM

## 2020-08-10 LAB — BASIC METABOLIC PANEL
Anion gap: 8 (ref 5–15)
BUN: 10 mg/dL (ref 8–23)
CO2: 25 mmol/L (ref 22–32)
Calcium: 9.3 mg/dL (ref 8.9–10.3)
Chloride: 98 mmol/L (ref 98–111)
Creatinine, Ser: 0.66 mg/dL (ref 0.44–1.00)
GFR, Estimated: >60 mL/min (ref >60)
Glucose, Bld: 103 mg/dL — ABNORMAL HIGH (ref 70–99)
Potassium: 4.3 mmol/L (ref 3.5–5.1)
Sodium: 131 mmol/L — ABNORMAL LOW (ref 135–145)

## 2020-08-10 LAB — CBC
HCT: 27.3 % — ABNORMAL LOW (ref 36.0–46.0)
Hemoglobin: 9.1 g/dL — ABNORMAL LOW (ref 12.0–15.0)
MCH: 27.9 pg (ref 26.0–34.0)
MCHC: 33.3 g/dL (ref 30.0–36.0)
MCV: 83.7 fL (ref 80.0–100.0)
Platelets: 177 10*3/uL (ref 150–400)
RBC: 3.26 MIL/uL — ABNORMAL LOW (ref 3.87–5.11)
RDW: 12.5 % (ref 11.5–15.5)
WBC: 9.9 10*3/uL (ref 4.0–10.5)
nRBC: 0 % (ref 0.0–0.2)

## 2020-08-10 MED ORDER — ASPIRIN 81 MG PO TBEC: 81.0000 mg | DELAYED_RELEASE_TABLET | Freq: Every day | ORAL | 11 refills | Status: AC

## 2020-08-10 MED ORDER — CLOPIDOGREL BISULFATE 75 MG PO TABS: 75.0000 mg | ORAL_TABLET | Freq: Every day | ORAL | 0 refills | Status: DC

## 2020-08-10 MED ORDER — NICOTINE 7 MG/24HR TD PT24: 7.0000 mg | MEDICATED_PATCH | Freq: Every day | TRANSDERMAL | 0 refills | Status: DC

## 2020-08-10 MED ORDER — VITAMIN B-12 1000 MCG PO TABS: 1000.0000 ug | ORAL_TABLET | Freq: Every day | ORAL | Status: AC

## 2020-08-10 MED ORDER — TRAMADOL HCL 50 MG PO TABS: 50.0000 mg | ORAL_TABLET | Freq: Four times a day (QID) | ORAL | 0 refills | Status: DC | PRN

## 2020-08-10 NOTE — Progress Notes (Signed)
Pt discharged home, discharge instructions provided, questions answered.

## 2020-08-10 NOTE — Progress Notes (Addendum)
  Progress Note    08/10/2020 7:22 AM 1 Day Post-Op  Subjective:  No stroke symptoms overnight including slurring speech, changes in vision, or one sided weakness   Vitals:   08/10/20 0357 08/10/20 0619  BP: 126/60   Pulse: 70 68  Resp: 13 19  Temp: 98 F (36.7 C)   SpO2: 99% 100%   Physical Exam: Lungs:  Non labored Incisions:  L neck incision c/d/i Abdomen:  soft Neurologic: CN grossly intact  CBC    Component Value Date/Time   WBC 9.9 08/10/2020 0400   RBC 3.26 (L) 08/10/2020 0400   HGB 9.1 (L) 08/10/2020 0400   HGB 12.2 01/14/2017 0849   HCT 27.3 (L) 08/10/2020 0400   HCT 37.6 01/14/2017 0849   PLT 177 08/10/2020 0400   PLT 182 01/14/2017 0849   MCV 83.7 08/10/2020 0400   MCV 87 01/14/2017 0849   MCH 27.9 08/10/2020 0400   MCHC 33.3 08/10/2020 0400   RDW 12.5 08/10/2020 0400   RDW 14.9 01/14/2017 0849   LYMPHSABS 1.4 08/09/2020 0059   LYMPHSABS 1.4 01/14/2017 0849   MONOABS 0.4 08/09/2020 0059   EOSABS 0.1 08/09/2020 0059   EOSABS 0.1 01/14/2017 0849   BASOSABS 0.0 08/09/2020 0059   BASOSABS 0.0 01/14/2017 0849    BMET    Component Value Date/Time   NA 131 (L) 08/10/2020 0603   NA 137 06/15/2020 1446   K 4.3 08/10/2020 0603   CL 98 08/10/2020 0603   CO2 25 08/10/2020 0603   GLUCOSE 103 (H) 08/10/2020 0603   BUN 10 08/10/2020 0603   BUN 12 06/15/2020 1446   CREATININE 0.66 08/10/2020 0603   CALCIUM 9.3 08/10/2020 0603   GFRNONAA >60 08/10/2020 0603   GFRAA 99 06/15/2020 1446    INR    Component Value Date/Time   INR 1.0 08/09/2020 0059     Intake/Output Summary (Last 24 hours) at 08/10/2020 7741 Last data filed at 08/10/2020 0600 Gross per 24 hour  Intake 1480 ml  Output 450 ml  Net 1030 ml     Assessment/Plan:  70 y.o. female is s/p L CEA 1 Day Post-Op   Neuro exam remains at baseline L neck incision unremarkable Ok for discharge from vascular surgery standpoint Vascular will write pain prescription; follow up in 2-3  weeks    Dagoberto Ligas, PA-C [facility] [phone] 08/10/2020 7:22 AM  I have independently interviewed and examined patient and agree with PA assessment and plan above.  Doing well status post right carotid endarterectomy.  Okay for discharge from vascular standpoint.  Lucille Crichlow C. Donzetta Matters, MD [facility] [place] Office: [phone] Pager: [phone]

## 2020-08-10 NOTE — Discharge Instructions (Signed)
   [facility]  Discharge Instructions   Carotid Endarterectomy (CEA)  Please refer to the following instructions for your post-procedure care. Your surgeon or physician assistant will discuss any changes with you.  Activity  You are encouraged to walk as much as you can. You can slowly return to normal activities but must avoid strenuous activity and heavy lifting until your doctor tell you it's OK. Avoid activities such as vacuuming or swinging a golf club. You can drive after one week if you are comfortable and you are no longer taking prescription pain medications. It is normal to feel tired for serval weeks after your surgery. It is also normal to have difficulty with sleep habits, eating, and bowel movements after surgery. These will go away with time.  Bathing/Showering  You may shower after you come home. Do not soak in a bathtub, hot tub, or swim until the incision heals completely.  Incision Care  Shower every day. Clean your incision with mild soap and water. Pat the area dry with a clean towel. You do not need a bandage unless otherwise instructed. Do not apply any ointments or creams to your incision. You may have skin glue on your incision. Do not peel it off. It will come off on its own in about one week. Your incision may feel thickened and raised for several weeks after your surgery. This is normal and the skin will soften over time. For Men Only: It's OK to shave around the incision but do not shave the incision itself for 2 weeks. It is common to have numbness under your chin that could last for several months.  Diet  Resume your normal diet. There are no special food restrictions following this procedure. A low fat/low cholesterol diet is recommended for all patients with vascular disease. In order to heal from your surgery, it is CRITICAL to get adequate nutrition. Your body requires vitamins, minerals, and protein. Vegetables are the best  source of vitamins and minerals. Vegetables also provide the perfect balance of protein. Processed food has little nutritional value, so try to avoid this.        Medications  Resume taking all of your medications unless your doctor or physician assistant tells you not to. If your incision is causing pain, you may take over-the- counter pain relievers such as acetaminophen (Tylenol). If you were prescribed a stronger pain medication, please be aware these medications can cause nausea and constipation. Prevent nausea by taking the medication with a snack or meal. Avoid constipation by drinking plenty of fluids and eating foods with a high amount of fiber, such as fruits, vegetables, and grains. Do not take Tylenol if you are taking prescription pain medications.  Follow Up  Our office will schedule a follow up appointment 2-3 weeks following discharge.  Please call us immediately for any of the following conditions  Increased pain, redness, drainage (pus) from your incision site. Fever of 101 degrees or higher. If you should develop stroke (slurred speech, difficulty swallowing, weakness on one side of your body, loss of vision) you should call 911 and go to the nearest emergency room.  Reduce your risk of vascular disease:  Stop smoking. If you would like help call [facility] at 1-800-QUIT-NOW (1-800-784-8669) or [place] at [phone]. Manage your cholesterol Maintain a desired weight Control your diabetes Keep your blood pressure down  If you have any questions, please call the office at [phone].   

## 2020-08-10 NOTE — Discharge Summary (Signed)
Physician Discharge Summary  Joy Williams RCV:[id number] DOB: 12/12/1950 DOA: 08/06/2020  PCP: Lorrene Reid, PA-C  Admit date: 08/06/2020 Discharge date: 08/10/2020  Admitted From: home Discharge disposition: home   Recommendations for Outpatient Follow-Up:   1. Asa plus plavix for 3 weeks then plavix alone (discussed with [name]) 2. Smoking cessation   Discharge Diagnosis:   Principal Problem:   TIA (transient ischemic attack) Active Problems:   Chronic diastolic congestive heart failure (HCC)   Chronic obstructive pulmonary disease (HCC)   Hypothyroidism   Tobacco abuse   Anemia   Symptomatic stenosis of right carotid artery    Discharge Condition: Improved.  Diet recommendation: Low sodium, heart healthy.  Wound care: None.  Code status: Full.   History of Present Illness:   HPI: Joy Williams is a 70 y.o. female with medical history significant of chronic diastolic heart failure, bilateral carotid artery stenosis, CAD, COPD, scleroderma, cold agglutinin disease, Raynaud's syndrome, telangiectasia, severe mitral regurgitation status post bioprosthetic MVR in 2018, hypothyroidism, COPD, history of lung cancer, history of TIA presented to the ED for evaluation of acute onset dizziness, left arm incoordination, and headache.  Patient states this afternoon around 3:30 PM she was outside with her granddaughter and all of a sudden felt very dizzy and unsteady.  States her left arm was numb and moving when she did not want it to move.  It felt like she could not control her arm.  She also had a severe headache and nausea at that time.  She had tightness in her chest.  Her granddaughter helped her sit on a chair and symptoms resolved in about 25 to 30 minutes except she was still having a headache when she came to the emergency room but it has now resolved after receiving Dilaudid and magnesium. She is fully vaccinated against COVID including booster shot.   Reports having a chronic cough due to allergies, no recent change.  Denies fevers, vomiting, abdominal pain, or diarrhea.  States she was previously smoking heavily but cut down on cigarette smoking after she had lung cancer in 2016.  At present she continues to smoke 4 to 5 cigarettes a day.    Hospital Course by Problem:   complicated migraine versus TIA/symptomatic right carotid artery disease -Patient had presented with evaluation of sudden onset dizziness, left upper extremity incoordination, headache, gait abnormalities which lasted about 25 to 30 minutes and subsequently resolved. -Head CT done negative for any acute abnormalities. -MRI brain negative for any acute abnormalities, multiple scattered remote lacunar infarcts involving bilateral cerebellar hemispheres, left thalamus, right caudate. 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 -2D echo with EF 65 to 70%, NWMA, mild LVH, moderate left atrial size dilated, mild MVR, no source of emboli noted.  -Risk factor modification  -Continue statin, Plavix, aspirin.   -Neurology consulted and are following.   -Vascular surgery, Dr Donzetta Matters s/p right CEA: 08/09/2020.    Chronic diastolic heart failure -Compensated.   -Continue home regimen Toprol-XL.   -Monitor volume status closely.   COPD Stable.  Home trelegy.  Hypothyroidism -TSH 0.603 (06/15/2020 ) -Continue Synthroid.   -Outpatient follow-up.    Tobacco abuse Tobacco cessation.  Nicotine patch. -encourage cessation  Hyperlipidemia -LDL 50.   -Continue statin.  Coronary artery disease/elevated troponin -Patient noted to have reported some chest tightness at the same time as a neurological symptoms which has since resolved. -EKG with no ischemic changes  noted. -Cardiac enzymes elevated but flattened.   -2D echo with EF 65 to 70%, NWMA, mild LVH, normal right ventricular systolic function, moderately dilated left atrial size, mild MVR.   -No further cardiac work-up needed.   Continue aspirin, Plavix, Toprol-XL statin -Outpatient follow-up.     Medical Consultants:    Neuro vascular  Discharge Exam:   Vitals:   08/10/20 0357 08/10/20 0619  BP: 126/60   Pulse: 70 68  Resp: 13 19  Temp: 98 F (36.7 C)   SpO2: 99% 100%   Vitals:   08/09/20 2346 08/09/20 2347 08/10/20 0357 08/10/20 0619  BP:   126/60   Pulse: 68 66 70 68  Resp: 16 19 13 19   Temp:   98 F (36.7 C)   TempSrc:   Oral   SpO2: 100% 100% 99% 100%  Weight:    65.4 kg  Height:        General exam: Appears calm and comfortable.   The results of significant diagnostics from this hospitalization (including imaging, microbiology, ancillary and laboratory) are listed below for reference.     Procedures and Diagnostic Studies:   CT Angio Head W or Wo Contrast  Result Date: 08/06/2020 CLINICAL DATA:  Initial evaluation for acute onset dizziness. EXAM: CT ANGIOGRAPHY HEAD AND NECK TECHNIQUE: Multidetector CT imaging of the head and neck was performed using the standard protocol during bolus administration of intravenous contrast. Multiplanar CT image reconstructions and MIPs were obtained to evaluate the vascular anatomy. Carotid stenosis measurements (when applicable) are obtained utilizing NASCET criteria, using the distal internal carotid diameter as the denominator. CONTRAST:  22mL OMNIPAQUE IOHEXOL 350 MG/ML SOLN COMPARISON:  Prior head CT from earlier the same day. Comparison made with prior CTA from 03/11/2018 as well. FINDINGS: CTA NECK FINDINGS Aortic arch: Visualized aortic arch normal in caliber with normal 3 vessel morphology. Mild-to-moderate atheromatous change about the arch and origin of the great vessels without hemodynamically significant stenosis.  Visualized subclavian arteries widely patent. Right carotid system: Right CCA patent from its origin to the bifurcation without stenosis. Bulky calcified plaque about the proximal right ICA with associated stenosis of up to 65% by NASCET criteria. Streak artifact partially obscures this region. Right ICA patent distally to the skull base. Left carotid system: Left CCA patent from its origin to the bifurcation without stenosis. Bulky calcified plaque about the left carotid bulb/proximal left ICA with associated stenosis of up to 50% by NASCET criteria. Left ICA patent distally to the skull base. Vertebral arteries: Both vertebral arteries arise from the subclavian arteries. Strongly dominant left vertebral artery with a diffusely hypoplastic right vertebral artery. No significant proximal subclavian artery stenosis. Vertebral arteries widely patent within the neck without stenosis, dissection or occlusion. Skeleton: No visible acute osseous finding. No discrete or worrisome osseous lesions. Degenerative grade 1 anterolisthesis of C3 on C4 and C4 on C5, with advanced degenerative spondylosis at C5-6 and C6-7. Other neck: No other acute soft tissue abnormality within neck. No mass or adenopathy. 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 Associated asymmetric sclerosis and medialization of the left arytenoid cartilage. No visible discrete lesion. Upper chest: Postsurgical changes present at the partially visualized left lung. 1.2 cm prevascular node noted. Additional 1.3 cm precarinal node. Asymmetric soft tissue fullness  within the visualized left hilum. Review of the MIP images confirms the above findings CTA HEAD FINDINGS Anterior circulation: Petrous segments patent bilaterally. Mild atheromatous change within the carotid siphons without significant stenosis. A1 segments patent bilaterally. Normal anterior communicating artery complex. Anterior cerebral arteries  patent to their distal aspects without stenosis. No M1 stenosis or occlusion. Normal MCA bifurcations. Distal MCA branches well perfused and symmetric. Posterior circulation: Dominant left vertebral artery patent to the vertebrobasilar junction without stenosis. Hypoplastic right vertebral artery largely terminates in PICA, although a tiny branch ascending towards the vertebrobasilar junction. Left PICA not visualized. Basilar diminutive but widely patent to its distal aspect. Dominant left AICA. Superior cerebellar arteries patent bilaterally. Predominant fetal type origin of both PCAs. PCAs well perfused and patent to their distal aspects. Venous sinuses: Patent. Anatomic variants: Predominant fetal type origin of the PCAs. Hypoplastic right vertebral artery largely terminates in PICA. No aneurysm. Review of the MIP images confirms the above findings IMPRESSION: 1. Negative CTA for large vessel occlusion. 2. Bulky calcified plaque about the proximal ICAs bilaterally, with associated stenoses of up to 65% on the right and 50% on the left. 3. Predominant fetal type origin of the PCAs with overall diminutive vertebrobasilar system. Hypoplastic right vertebral artery largely terminates in PICA. 4. Few mildly enlarged mediastinal lymph nodes with asymmetric soft tissue fullness at the left hilum and post treatment changes within the partially visualized left lung. Findings are overall similar as compared to previous CTA from 2019. 5. 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 Electronically Signed   By: Jeannine Boga M.D.   On: 08/06/2020 23:36   CT HEAD WO CONTRAST  Result Date: 08/06/2020 CLINICAL DATA:  Neuro deficit, acute, stroke suspected. Additional history provided: Bilateral ear pain/pressure, headache. EXAM: CT HEAD WITHOUT CONTRAST TECHNIQUE: Contiguous axial images were obtained from the base of the skull through the vertex without intravenous  contrast. COMPARISON:  Brain MRI 03/12/2018. FINDINGS: Brain: Cerebral volume is normal for age. Lacunar infarct within the left basal ganglia, new as compared to the brain MRI of 03/12/2018 but otherwise age indeterminate (series 3, image 13) (series 5, image 24). Known small infarcts within the left thalamus and left cerebellar hemisphere better appreciated on the prior brain MRI of 03/12/2018. There is no acute intracranial hemorrhage. No demarcated cortical infarct. No extra-axial fluid collection. No evidence of intracranial mass. No midline shift. Vascular: No hyperdense vessel.  Atherosclerotic calcifications Skull: Normal. Negative for fracture or focal lesion. Sinuses/Orbits: Visualized orbits show no acute finding. No significant paranasal sinus disease at the imaged levels. IMPRESSION: No evidence of acute intracranial hemorrhage or acute demarcated cortical infarction. A lacunar infarct within the left basal ganglia is new as compared to the brain MRI of 03/12/2018, but otherwise age indeterminate. Known small infarcts within the left thalamus and left cerebellar hemisphere were better appreciated on the prior brain MRI of 03/12/2018. Electronically Signed   By: Kellie Simmering DO   On: 08/06/2020 18:30   CT Angio Neck W and/or Wo Contrast  Result Date: 08/06/2020 CLINICAL DATA:  Initial evaluation for acute onset dizziness. EXAM: CT ANGIOGRAPHY HEAD AND NECK TECHNIQUE: Multidetector CT imaging of the head and neck was performed using the standard protocol during bolus administration of intravenous contrast. Multiplanar CT image reconstructions and MIPs were obtained to evaluate the vascular anatomy. Carotid stenosis measurements (when applicable) are obtained utilizing NASCET criteria, using the distal internal carotid diameter as the denominator. CONTRAST:  56mL OMNIPAQUE IOHEXOL 350 MG/ML SOLN COMPARISON:  Prior head CT from earlier the same day. Comparison made with prior CTA from 03/11/2018 as  well. FINDINGS: CTA NECK FINDINGS Aortic arch: Visualized aortic arch normal in caliber with normal 3 vessel morphology. Mild-to-moderate atheromatous change about the arch and origin of the great vessels without hemodynamically significant stenosis. Visualized subclavian arteries widely patent. Right carotid system: Right CCA patent from its origin to the bifurcation without stenosis. Bulky calcified plaque about the proximal right ICA with associated stenosis of up to 65% by NASCET criteria. Streak artifact partially obscures this region. Right ICA patent distally to the skull base. Left carotid system: Left CCA patent from its origin to the bifurcation without stenosis. Bulky calcified plaque about the left carotid bulb/proximal left ICA with associated stenosis of up to 50% by NASCET criteria. Left ICA patent distally to the skull base. Vertebral arteries: Both vertebral arteries arise from the subclavian arteries. Strongly dominant left vertebral artery with a diffusely hypoplastic right vertebral artery. No significant proximal subclavian artery stenosis. Vertebral arteries widely patent within the neck without stenosis, dissection or occlusion. Skeleton: No visible acute osseous finding. No discrete or worrisome osseous lesions. Degenerative grade 1 anterolisthesis of C3 on C4 and C4 on C5, with advanced degenerative spondylosis at C5-6 and C6-7. Other neck: No other acute soft tissue abnormality within neck. No mass or adenopathy. 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 Associated asymmetric sclerosis and medialization of the left arytenoid cartilage. No visible discrete lesion. Upper chest: Postsurgical changes present at the partially visualized left lung. 1.2 cm prevascular node noted. Additional 1.3 cm precarinal node. Asymmetric soft tissue fullness within the visualized left hilum. Review of the MIP images confirms the above findings CTA HEAD  FINDINGS Anterior circulation: Petrous segments patent bilaterally. Mild atheromatous change within the carotid siphons without significant stenosis. A1 segments patent bilaterally. Normal anterior communicating artery complex. Anterior cerebral arteries patent to their distal aspects without stenosis. No M1 stenosis or occlusion. Normal MCA bifurcations. Distal MCA branches well perfused and symmetric. Posterior circulation: Dominant left vertebral artery patent to the vertebrobasilar junction without stenosis. Hypoplastic right vertebral artery largely terminates in PICA, although a tiny branch ascending towards the vertebrobasilar junction. Left PICA not visualized. Basilar diminutive but widely patent to its distal aspect. Dominant left AICA. Superior cerebellar arteries patent bilaterally. Predominant fetal type origin of both PCAs. PCAs well perfused and patent to their distal aspects. Venous sinuses: Patent. Anatomic variants: Predominant fetal type origin of the PCAs. Hypoplastic right vertebral artery largely terminates in PICA. No aneurysm. Review of the MIP images confirms the above findings IMPRESSION: 1. Negative CTA for large vessel occlusion. 2. Bulky calcified plaque about the proximal ICAs bilaterally, with associated stenoses of up to 65% on the right and 50% on the left. 3. Predominant fetal type origin of the PCAs with overall diminutive vertebrobasilar system. Hypoplastic right vertebral artery largely terminates in PICA. 4. Few mildly enlarged mediastinal lymph nodes with asymmetric soft tissue fullness at the left hilum and post treatment changes within the partially visualized left lung. Findings are overall similar as compared to previous CTA from 2019. 5. 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 Electronically Signed   By: Jeannine Boga M.D.   On: 08/06/2020 23:36   MR BRAIN WO CONTRAST  Result Date: 08/07/2020 CLINICAL DATA:   Initial evaluation for neuro deficit, stroke suspected. EXAM: MRI  HEAD WITHOUT CONTRAST TECHNIQUE: Multiplanar, multiecho pulse sequences of the brain and surrounding structures were obtained without intravenous contrast. COMPARISON:  Prior CTs from 08/06/2020 as well as previous MRI from 03/12/2018. FINDINGS: Brain: Cerebral volume within normal limits for age. Multiple scattered remote lacunar type infarcts seen about the bilateral cerebellar hemispheres and left thalamus. Additional tiny remote lacunar infarct at the right caudate. Overall, appearance has progressed as compared to previous MRI from 2019. No abnormal foci of restricted diffusion to suggest acute or subacute ischemia. Gray-white matter differentiation otherwise maintained. No areas of remote cortical infarction. No acute intracranial hemorrhage. Few punctate foci of susceptibility artifact noted, consistent with small chronic micro hemorrhages, suspected to be hypertensive in nature. No mass lesion, midline shift or mass effect. No hydrocephalus or extra-axial fluid collection. Pituitary gland and suprasellar region normal. Midline structures intact. Vascular: Major intracranial vascular flow voids are maintained. Skull and upper cervical spine: Craniocervical junction within normal limits. Bone marrow signal intensity normal. No scalp soft tissue abnormality. Sinuses/Orbits: Globes and orbital soft tissues demonstrate no acute finding. Paranasal sinuses are largely clear. No mastoid effusion. Inner ear structures grossly normal. Other: None. IMPRESSION: 1. No acute intracranial abnormality. 2. Multiple scattered remote lacunar infarcts involving the bilateral cerebellar hemispheres, left thalamus, and right caudate. Overall, appearance has progressed as compared to previous MRI from 2019. Electronically Signed   By: Jeannine Boga M.D.   On: 08/07/2020 02:53   ECHOCARDIOGRAM COMPLETE  Result Date: 08/07/2020    ECHOCARDIOGRAM REPORT    Patient Name:   ANDERSYN FRAGOSO Doubrava Date of Exam: 08/07/2020 Medical Rec #:  [id number]        Height:       67.0 in Accession #:    [id number]       Weight:       145.0 lb Date of Birth:  [birth year]        BSA:          1.764 m Patient Age:    70 years         BP:           117/58 mmHg Patient Gender: F                HR:           77 bpm. Exam Location:  Inpatient Procedure: Cardiac Doppler and Color Doppler Indications:    TIA  History:        Patient has prior history of Echocardiogram examinations, most                 recent 02/25/2019.  Sonographer:    Merrie Roof RDCS Referring Phys: [id number] [place]  1. Left ventricular ejection fraction, by estimation, is 65 to 70%. The left ventricle has normal function. The left ventricle has no regional wall motion abnormalities. There is mild left ventricular hypertrophy.  2. Right ventricular systolic function is normal. The right ventricular size is normal.  3. Left atrial size was moderately dilated.  4. Mild mitral valve regurgitation. Moderate mitral annular calcification.  5. The aortic valve is tricuspid. Aortic valve regurgitation is not visualized. Mild aortic valve sclerosis is present, with no evidence of aortic valve stenosis.  6. The inferior vena cava is normal in size with greater than 50% respiratory variability, suggesting right atrial pressure of 3 mmHg. Comparison(s): The left ventricular function is unchanged. FINDINGS  Left Ventricle: Left ventricular ejection fraction, by estimation, is 65 to 70%. The left ventricle has  normal function. The left ventricle has no regional wall motion abnormalities. The left ventricular internal cavity size was normal in size. There is  mild left ventricular hypertrophy. Right Ventricle: The right ventricular size is normal. Right vetricular wall thickness was not assessed. Right ventricular systolic function is normal. Left Atrium: Left atrial size was moderately dilated. Right Atrium: Right atrial  size was normal in size. Pericardium: There is no evidence of pericardial effusion. Mitral Valve: Moderate mitral annular calcification. Mild mitral valve regurgitation. MV peak gradient, 5.6 mmHg. The mean mitral valve gradient is 2.0 mmHg. Tricuspid Valve: The tricuspid valve is normal in structure. Tricuspid valve regurgitation is mild. Aortic Valve: The aortic valve is tricuspid. Aortic valve regurgitation is not visualized. Mild aortic valve sclerosis is present, with no evidence of aortic valve stenosis. Aortic valve mean gradient measures 6.0 mmHg. Aortic valve peak gradient measures 12.7 mmHg. Aortic valve area, by VTI measures 2.07 cm. Pulmonic Valve: The pulmonic valve was normal in structure. Pulmonic valve regurgitation is trivial. Aorta: The aortic root and ascending aorta are structurally normal, with no evidence of dilitation. Venous: The inferior vena cava is normal in size with greater than 50% respiratory variability, suggesting right atrial pressure of 3 mmHg. IAS/Shunts: No atrial level shunt detected by color flow Doppler.  LEFT VENTRICLE PLAX 2D LVIDd:         3.50 cm LVIDs:         2.30 cm LV PW:         1.30 cm LV IVS:        1.30 cm LVOT diam:     1.80 cm LV SV:         77 LV SV Index:   43 LVOT Area:     2.54 cm  RIGHT VENTRICLE          IVC RV Basal diam:  3.60 cm  IVC diam: 1.20 cm LEFT ATRIUM           Index       RIGHT ATRIUM           Index LA diam:      3.60 cm 2.04 cm/m  RA Area:     16.00 cm LA Vol (A4C): 78.3 ml 44.39 ml/m RA Volume:   38.80 ml  22.00 ml/m  AORTIC VALVE AV Area (Vmax):    2.16 cm AV Area (Vmean):   2.12 cm AV Area (VTI):     2.07 cm AV Vmax:           178.00 cm/s AV Vmean:          121.000 cm/s AV VTI:            0.370 m AV Peak Grad:      12.7 mmHg AV Mean Grad:      6.0 mmHg LVOT Vmax:         151.00 cm/s LVOT Vmean:        101.000 cm/s LVOT VTI:          0.301 m LVOT/AV VTI ratio: 0.81  AORTA Ao Root diam: 3.10 cm Ao Asc diam:  3.10 cm MITRAL VALVE MV  Area (PHT): 3.17 cm    SHUNTS MV Area VTI:   2.43 cm    Systemic VTI:  0.30 m MV Peak grad:  5.6 mmHg    Systemic Diam: 1.80 cm MV Mean grad:  2.0 mmHg MV Vmax:       1.18 m/s MV Vmean:  69.7 cm/s MV Decel Time: 239 msec MV E velocity: 98.10 cm/s MV A velocity: 89.50 cm/s MV E/A ratio:  1.10 Dorris Carnes MD Electronically signed by Dorris Carnes MD Signature Date/Time: 08/07/2020/5:00:45 PM    Final      Labs:   Basic Metabolic Panel: Recent Labs  Lab 08/06/20 1800 08/08/20 0558 08/09/20 0059 08/10/20 0603  NA 135 132* 131* 131*  K 4.0 4.1 3.9 4.3  CL 102 99 96* 98  CO2 27 27 29 25   GLUCOSE 104* 90 100* 103*  BUN 14 10 13 10   CREATININE 0.72 0.69 0.68 0.66  CALCIUM 9.0 8.9 9.2 9.3  MG  --  1.8 2.0  --    GFR Estimated Creatinine Clearance: 64.5 mL/min (by C-G formula based on SCr of 0.66 mg/dL). Liver Function Tests: Recent Labs  Lab 08/06/20 1800  AST 29  ALT 20  ALKPHOS 64  BILITOT 0.3  PROT 6.3*  ALBUMIN 3.5   No results for input(s): LIPASE, AMYLASE in the last 168 hours. No results for input(s): AMMONIA in the last 168 hours. Coagulation profile Recent Labs  Lab 08/06/20 1800 08/09/20 0059  INR 1.0 1.0    CBC: Recent Labs  Lab 08/06/20 1800 08/08/20 0558 08/09/20 0059 08/10/20 0400  WBC 9.9 6.1 6.6 9.9  NEUTROABS 8.0*  --  4.6  --   HGB 11.4* 11.3* 11.1* 9.1*  HCT 35.6* 35.0* 34.4* 27.3*  MCV 87.3 85.0 85.6 83.7  PLT 212 192 185 177   Cardiac Enzymes: No results for input(s): CKTOTAL, CKMB, CKMBINDEX, TROPONINI in the last 168 hours. BNP: Invalid input(s): POCBNP CBG: No results for input(s): GLUCAP in the last 168 hours. D-Dimer No results for input(s): DDIMER in the last 72 hours. Hgb A1c No results for input(s): HGBA1C in the last 72 hours. Lipid Profile No results for input(s): CHOL, HDL, LDLCALC, TRIG, CHOLHDL, LDLDIRECT in the last 72 hours. Thyroid function studies No results for input(s): TSH, T4TOTAL, T3FREE, THYROIDAB in the last  72 hours.  Invalid input(s): FREET3 Anemia work up No results for input(s): VITAMINB12, FOLATE, FERRITIN, TIBC, IRON, RETICCTPCT in the last 72 hours. Microbiology Recent Results (from the past 240 hour(s))  Resp Panel by RT-PCR (Flu A&B, Covid) Nasopharyngeal Swab     Status: None   Collection Time: 08/07/20 12:08 PM   Specimen: Nasopharyngeal Swab; Nasopharyngeal(NP) swabs in vial transport medium  Result Value Ref Range Status   SARS Coronavirus 2 by RT PCR NEGATIVE NEGATIVE Final    Comment: (NOTE) SARS-CoV-2 target nucleic acids are NOT DETECTED.  The SARS-CoV-2 RNA is generally detectable in upper respiratory specimens during the acute phase of infection. The lowest concentration of SARS-CoV-2 viral copies this assay can detect is 138 copies/mL. A negative result does not preclude SARS-Cov-2 infection and should not be used as the sole basis for treatment or other patient management decisions. A negative result may occur with  improper specimen collection/handling, submission of specimen other than nasopharyngeal swab, presence of viral mutation(s) within the areas targeted by this assay, and inadequate number of viral copies(<138 copies/mL). A negative result must be combined with clinical observations, patient history, and epidemiological information. The expected result is Negative.  Fact Sheet for Patients:  EntrepreneurPulse.com.au  Fact Sheet for Healthcare Providers:  IncredibleEmployment.be  This test is no t yet approved or cleared by the [place] FDA and  has been authorized for detection and/or diagnosis of SARS-CoV-2 by FDA under an Emergency Use Authorization (EUA). This EUA will  remain  in effect (meaning this test can be used) for the duration of the COVID-19 declaration under Section 564(b)(1) of the Act, 21 U.S.C.section 360bbb-3(b)(1), unless the authorization is terminated  or revoked sooner.       Influenza  A by PCR NEGATIVE NEGATIVE Final   Influenza B by PCR NEGATIVE NEGATIVE Final    Comment: (NOTE) The Xpert Xpress SARS-CoV-2/FLU/RSV plus assay is intended as an aid in the diagnosis of influenza from Nasopharyngeal swab specimens and should not be used as a sole basis for treatment. Nasal washings and aspirates are unacceptable for Xpert Xpress SARS-CoV-2/FLU/RSV testing.  Fact Sheet for Patients: EntrepreneurPulse.com.au  Fact Sheet for Healthcare Providers: IncredibleEmployment.be  This test is not yet approved or cleared by the [place] FDA and has been authorized for detection and/or diagnosis of SARS-CoV-2 by FDA under an Emergency Use Authorization (EUA). This EUA will remain in effect (meaning this test can be used) for the duration of the COVID-19 declaration under Section 564(b)(1) of the Act, 21 U.S.C. section 360bbb-3(b)(1), unless the authorization is terminated or revoked.  Performed at [facility], [place] [address], [place], [place] [address]      Discharge Instructions:   Discharge Instructions    Ambulatory referral to Physical Therapy   Complete by: As directed    Diet - low sodium heart healthy   Complete by: As directed    Discharge instructions   Complete by: As directed    Asa plus plavix x 3 weeks then PLAVIX alone   Discharge wound care:   Complete by: As directed    See attached information from vascular   Increase activity slowly   Complete by: As directed      Allergies as of 08/10/2020      Reactions   Penicillins Hives   Unknown, occurred as a child Has patient had a PCN reaction causing immediate rash, facial/tongue/throat swelling, SOB or lightheadedness with hypotension: Yes Has patient had a PCN reaction causing severe rash involving mucus membranes or skin necrosis: No Has patient had a PCN reaction that required hospitalization  Has patient had a PCN reaction occurring within the last  10 years: No If all of the above answers are "NO", then may proceed with Cephalosporin use. No      Medication List    TAKE these medications   acetaminophen 500 MG tablet Commonly known as: TYLENOL Take 1,000 mg by mouth every 6 (six) hours as needed for mild pain or headache.   aspirin 81 MG EC tablet Take 1 tablet (81 mg total) by mouth daily for 21 days. Swallow whole.   atorvastatin 20 MG tablet Commonly known as: LIPITOR TAKE 1 TABLET (20 MG TOTAL) BY MOUTH DAILY AT 6 PM.   azelastine 0.1 % nasal spray Commonly known as: ASTELIN Place 2 sprays into both nostrils 2 (two) times daily. Use in each nostril as directed What changed:   when to take this  reasons to take this  additional instructions   clopidogrel 75 MG tablet Commonly known as: PLAVIX Take 1 tablet (75 mg total) by mouth daily. What changed: additional instructions   levothyroxine 175 MCG tablet Commonly known as: SYNTHROID TAKE 1 TABLET BY MOUTH DAILY BEFORE BREAKFAST What changed:   how much to take  how to take this  when to take this  additional instructions   MELATONIN PO Take 1 tablet by mouth at bedtime as needed (sleep).   metoprolol succinate 25 MG 24 hr  tablet Commonly known as: Toprol XL Take 0.5 tablets (12.5 mg total) by mouth daily. What changed: when to take this   multivitamin with minerals tablet Take 1 tablet by mouth daily.   nicotine 7 mg/24hr patch Commonly known as: NICODERM CQ - dosed in mg/24 hr Place 1 patch (7 mg total) onto the skin daily.   nitroGLYCERIN 0.4 MG SL tablet Commonly known as: NITROSTAT Place 1 tablet (0.4 mg total) under the tongue every 5 (five) minutes as needed for chest pain.   traMADol 50 MG tablet Commonly known as: Ultram Take 1 tablet (50 mg total) by mouth every 6 (six) hours as needed.   Trelegy Ellipta 100-62.5-25 MCG/INH Aepb Generic drug: Fluticasone-Umeclidin-Vilant TAKE 1 PUFF BY MOUTH EVERY DAY What changed: See the new  instructions.   Turmeric 500 MG Caps Take 500 mg by mouth daily.   vitamin B-12 1000 MCG tablet Commonly known as: CYANOCOBALAMIN Take 1 tablet (1,000 mcg total) by mouth daily.            Discharge Care Instructions  (From admission, onward)         Start     Ordered   08/10/20 0000  Discharge wound care:       Comments: See attached information from vascular   08/10/20 1791          Follow-up Information    [facility] Follow up in 3 week(s).   Specialty: Vascular Surgery Contact information: [address] [place] [address] [phone]       Lorrene Reid, PA-C Follow up in 1 week(s).   Specialty: Physician Assistant Contact information: [place] [address] [phone]        Freada Bergeron, MD .   Specialties: Cardiology, Radiology Contact information: [address] [place] [address] [phone]                Time coordinating discharge: 35 min  Signed:  Geradine Girt DO  [facility] 08/10/2020, 8:24 AM

## 2020-08-12 DIAGNOSIS — Z48812 Encounter for surgical aftercare following surgery on the circulatory system: Secondary | ICD-10-CM | POA: Diagnosis not present

## 2020-08-12 DIAGNOSIS — Z85118 Personal history of other malignant neoplasm of bronchus and lung: Secondary | ICD-10-CM | POA: Diagnosis not present

## 2020-08-12 DIAGNOSIS — F17210 Nicotine dependence, cigarettes, uncomplicated: Secondary | ICD-10-CM | POA: Diagnosis not present

## 2020-08-12 DIAGNOSIS — E039 Hypothyroidism, unspecified: Secondary | ICD-10-CM | POA: Diagnosis not present

## 2020-08-12 DIAGNOSIS — I5032 Chronic diastolic (congestive) heart failure: Secondary | ICD-10-CM | POA: Diagnosis not present

## 2020-08-12 DIAGNOSIS — I110 Hypertensive heart disease with heart failure: Secondary | ICD-10-CM | POA: Diagnosis not present

## 2020-08-12 DIAGNOSIS — I6521 Occlusion and stenosis of right carotid artery: Secondary | ICD-10-CM | POA: Diagnosis not present

## 2020-08-12 DIAGNOSIS — G459 Transient cerebral ischemic attack, unspecified: Secondary | ICD-10-CM | POA: Diagnosis not present

## 2020-08-12 DIAGNOSIS — J449 Chronic obstructive pulmonary disease, unspecified: Secondary | ICD-10-CM | POA: Diagnosis not present

## 2020-08-12 DIAGNOSIS — M6281 Muscle weakness (generalized): Secondary | ICD-10-CM | POA: Diagnosis not present

## 2020-08-13 LAB — TYPE AND SCREEN
ABO/RH(D): B POS
Antibody Screen: POSITIVE
Unit division: 0
Unit division: 0

## 2020-08-13 LAB — BPAM RBC
Blood Product Expiration Date: 202204182359
Blood Product Expiration Date: 202204182359
Unit Type and Rh: 7300
Unit Type and Rh: 7300

## 2020-08-13 NOTE — Progress Notes (Signed)
Cardiology Office Note:    Date:  08/15/2020   ID:  JAYLAN DUGGAR, DOB [birth year], MRN [id number]  PCP:  Lorrene Reid, [place]  Cardiologist:  Freada Bergeron, MD  Advanced Practice Provider:  No care team member to display Electrophysiologist:  None   Referring MD: Lorrene Reid, PA-C    History of Present Illness:    Joy Williams is a 70 y.o. female with a hx of MVP with severe MR s/p minimally imvasive bioprosthetic MVR with Dr. Ricard Dillon 06/2016, carotid artery disease, diastolic HF, history of TID, lung cancer s/p resection with chemo, tobacco use and hypothyroidism who returns to clinic for follow-up. Was previously followed by Dr. Meda Coffee.  Patient was admitted 02/24/19-02/25/19 with chest discomfort. High-sensitivity troponin levels were elevated (1471 >>1625 >>1246 >>1150). There was concern for non-ST elevation myocardial infarction. However, cardiac catheterization demonstrated no CAD. Echocardiogram demonstrated normal LV function and normally functioning mitral valve prosthesis. She was followed by Dr. Debara Pickett in the hospital and he felt that her presentation was c/w [facility] (myocardial infarction with non-obstructive coronary arteries) or non-ACS troponin elevation. The possible causes included myocarditis, coronary spasm, cardioembolic event. Underwent cardiac MRI that showed focal subendocardial LGE into mid inferolateral wall consistent with infarct seen on territory with concern for a thromboembolic event given clean cath. She has had no further chest pain at that time and was managed medically and worked with cardiac rehab at that time.  In November of 2020, she developed palpitations. 4 week zio placed with results detailed below:  4 weeks Zio patch monitor in November 2020  Sinus bradycardia to sinus tachycardia.  No atrial fibrillation.  3 runs of nonsustained ventricular tachycardia's with longest lasting 7  beats.  15 very short runs of SVTs, most probably atrial tachycardia.  Recent admission to [facility] from 08/06/20-08/10/20 where she presented for acute onset dizziness, left arm incoordination and HA. CT head negative for acute abnormalities. MRI brain without acute infarct but showed multiple scattered remote lacunar infarcts. 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 TTE with EF 65 to 70%, NWMA, mild LVH, moderate left atrial size dilated, mild MVR, no source of emboli noted.  Risk factor modification. She underwent right CEA on 08/09/20 with [name]. She was continued on ASA, plavix and statin.  Today, the patient states she continues to have a headache and dizziness when going from a sitting to a standing position. Feels a little unsteady on her feet. Planned to work physical therapy starting tomorrow. No chest pain or shortness of breath. Right CEA site sore but clean, dry and intact. Blood pressures have been fluctuating at home from 100-130s. She is concerned about her troponin elevation in the hospital as well as her echo findings due to mild MR.   Past Medical History:  Diagnosis Date  . Allergy   . Asthma    pt denies this, but is on Dulera  . Chronic diastolic congestive heart failure ([place])   . 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 COPD (chronic obstructive pulmonary disease) ([place])   . Family history of adverse reaction to anesthesia    Mother- very sensitive to medication  . Heart murmur  MVP  . Hypothyroidism   . 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 MVP (mitral valve  prolapse)   . PONV (postoperative nausea and vomiting)   . Raynaud's disease    Raynaud's  . Raynaud's syndrome   . 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   31 mm [facility] mitral bovine bioprosthetic tissue valve placed via right mini thoracotomy approach  . Severe mitral regurgitation 11/15/2014  . Shortness of breath dyspnea    with exertion  . STD (sexually transmitted disease)   . Telangiectasia   . Thyroid disease     Past Surgical History:  Procedure Laterality Date  . BACK SURGERY     x 3  Disectomy  . BREAST BIOPSY Left   . CARDIAC CATHETERIZATION N/A 03/08/2016   Procedure: Right/Left Heart Cath and Coronary Angiography;  Surgeon: Sherren Mocha, MD;  Location: [place] [facility];  Service: Cardiovascular;  Laterality: N/A;  . CLAVICLE SURGERY Left 2013   plate to left collar bone  . ENDARTERECTOMY Right 08/09/2020   Procedure: RIGHT CAROTID ENDARTERECTOMY;  Surgeon: Waynetta Sandy, MD;  Location: [place];  Service: Vascular;  Laterality: Right;  . LAPAROSCOPY     ? reason-age 65   . LEFT HEART CATH AND CORONARY ANGIOGRAPHY N/A 02/25/2019   Procedure: LEFT HEART CATH AND CORONARY ANGIOGRAPHY;  Surgeon: Jettie Booze, MD;  Location: [place] [facility];  Service: Cardiovascular;  Laterality: N/A;  . LUNG CANCER SURGERY    . MITRAL VALVE REPAIR Right 06/27/2016   Procedure: MINIMALLY INVASIVE MITRAL VALVE REPLACEMENT;  Surgeon: Rexene Alberts, MD;  Location: [place];  Service: Open Heart Surgery;  Laterality: Right;  . TEE WITHOUT CARDIOVERSION N/A 02/22/2016   Procedure: TRANSESOPHAGEAL ECHOCARDIOGRAM (TEE);  Surgeon: Dorothy Spark, MD;  Location: [place];  Service: Cardiovascular;  Laterality: N/A;  . TEE WITHOUT CARDIOVERSION N/A 06/27/2016   Procedure: TRANSESOPHAGEAL ECHOCARDIOGRAM (TEE);  Surgeon: Rexene Alberts, MD;  Location: [place];  Service: Open Heart Surgery;  Laterality: N/A;  . TONSILLECTOMY    .  VIDEO ASSISTED THORACOSCOPY (VATS)/WEDGE RESECTION Left 12/30/2014   Bronchoscopy, Mediastinoscopy, Left VATS for Wedge resection LUL x2 adn LLL x1 - Dr. Elenor Quinones at [facility]  . VOCAL CORD INJECTION Left 2017   injected with botox  . wrist surgery Left 2015   plate to wrist     Current Medications: Current Meds  Medication Sig  . acetaminophen (TYLENOL) 500 MG tablet Take 1,000 mg by mouth every 6 (six) hours as needed for mild pain or headache.  Marland Kitchen aspirin EC 81 MG EC tablet Take 1 tablet (81 mg total) by mouth daily for 21 days. Swallow whole.  Marland Kitchen azelastine (ASTELIN) 0.1 % nasal spray Place 2 sprays into both nostrils as needed for rhinitis. Use in each nostril as directed  . clopidogrel (PLAVIX) 75 MG tablet Take 1 tablet (75 mg total) by mouth daily.  Marland Kitchen levothyroxine (SYNTHROID) 175 MCG tablet TAKE 1 TABLET BY MOUTH DAILY BEFORE BREAKFAST  . MELATONIN PO Take 1 tablet by mouth at bedtime as needed (sleep).  . metoprolol succinate (TOPROL XL) 25 MG 24 hr tablet Take 0.5 tablets (12.5 mg total) by mouth daily.  . Multiple Vitamins-Minerals (MULTIVITAMIN WITH MINERALS) tablet Take 1 tablet by mouth daily.  . nicotine (NICODERM CQ - DOSED  IN MG/24 HR) 7 mg/24hr patch Place 1 patch (7 mg total) onto the skin daily.  . nitroGLYCERIN (NITROSTAT) 0.4 MG SL tablet Place 1 tablet (0.4 mg total) under the tongue every 5 (five) minutes as needed for chest pain.  . rosuvastatin (CRESTOR) 20 MG tablet Take 1 tablet (20 mg total) by mouth daily.  . traMADol (ULTRAM) 50 MG tablet Take 1 tablet (50 mg total) by mouth every 6 (six) hours as needed.  . TRELEGY ELLIPTA 100-62.5-25 MCG/INH AEPB TAKE 1 PUFF BY MOUTH EVERY DAY  . Turmeric 500 MG CAPS Take 500 mg by mouth daily.  . vitamin B-12 (CYANOCOBALAMIN) 1000 MCG tablet Take 1 tablet (1,000 mcg total) by mouth daily.  . [DISCONTINUED] atorvastatin (LIPITOR) 20 MG tablet TAKE 1 TABLET (20 MG TOTAL) BY MOUTH DAILY AT 6 PM.     Allergies:   Penicillins    Social History   Socioeconomic History  . Marital status: Divorced    Spouse name: Not on file  . Number of children: Not on file  . Years of education: 41  . Highest education level: Not on file  Occupational History  . Occupation: Retired  Tobacco Use  . Smoking status: Former Smoker    Packs/day: 0.25    Years: 24.00    Pack years: 6.00    Types: E-cigarettes, Cigarettes    Start date: 05/21/1983    Quit date: 08/26/2019    Years since quitting: 0.9  . Smokeless tobacco: Never Used  . Tobacco comment: Patient is interested in trying nicotene gum  Vaping Use  . Vaping Use: Former  . Quit date: 05/20/2006  Substance and Sexual Activity  . Alcohol use: Yes    Alcohol/week: 2.0 standard drinks    Types: 1 Shots of liquor, 1 Standard drinks or equivalent per week  . Drug use: No  . Sexual activity: Never    Partners: Male    Birth control/protection: Post-menopausal  Other Topics Concern  . Not on file  Social History Narrative  . Not on file   Social Determinants of Health   Financial Resource Strain: Not on file  Food Insecurity: Not on file  Transportation Needs: Not on file  Physical Activity: Not on file  Stress: Not on file  Social Connections: Not on file     Family History: The patient's family history includes Dementia in her father; Mitral valve prolapse in her brother, mother, and sister; Prostate cancer in her father. There is no history of Colon cancer, Colon polyps, Rectal cancer, Esophageal cancer, Stomach cancer, or Heart attack.  ROS:   Please see the history of present illness.    Review of Systems  Constitutional: Positive for malaise/fatigue. Negative for chills and fever.  HENT: Negative for sore throat.   Eyes: Negative for pain and redness.  Respiratory: Negative for shortness of breath.   Cardiovascular: Negative for chest pain, palpitations, orthopnea, claudication, leg swelling and PND.  Gastrointestinal: Negative for melena, nausea and  vomiting.  Genitourinary: Negative for dysuria and flank pain.  Musculoskeletal: Positive for myalgias. Negative for falls.  Neurological: Positive for dizziness and headaches. Negative for loss of consciousness.  Endo/Heme/Allergies: Negative for polydipsia.  Psychiatric/Behavioral: Negative for substance abuse.    EKGs/Labs/Other Studies Reviewed:    The following studies were reviewed today: TTE 09/04/20: 1. Left ventricular ejection fraction, by estimation, is 65 to 70%. The  left ventricle has normal function. The left ventricle has no regional  wall motion abnormalities. There is mild  left ventricular hypertrophy.  2. Right ventricular systolic function is normal. The right ventricular  size is normal.  3. Left atrial size was moderately dilated.  4. Mild mitral valve regurgitation. Moderate mitral annular  calcification.  5. The aortic valve is tricuspid. Aortic valve regurgitation is not  visualized. Mild aortic valve sclerosis is present, with no evidence of  aortic valve stenosis.  6. The inferior vena cava is normal in size with greater than 50%  respiratory variability, suggesting right atrial pressure of 3 mmHg.  Cardiac catheterization 02/25/2019 Normal coronary arteries EF 55-65   Echocardiogram 02/25/2019 EF 60-65, mildly increased LV posterior wall thickness, normal RV SF, mild to moderate LAE, MVR with trace MR and no evidence of mitral stenosis, trivial TR, trivial PI  Echo 11/12/2018 EF 60-65, mild LVH, normal wall motion, normal RVSF, bioprosthetic mitral valve with trivial MR and no significant stenosis (mean gradient 10 mmHg), mild dilation of aortic root (37 mm), moderate LAE, possible PFO by color Doppler  Carotid US 03/13/2018 Summary: Right Carotid: Velocities in the right ICA are consistent with a 1-39% stenosis. Left Carotid: Velocities in the left ICA are consistent with a 1-39% stenosis. Vertebrals: Bilateral vertebral arteries demonstrate  antegrade flow.  Echo 03/12/2018 Moderate to severe LVH, EF 55-60, normal wall motion, grade 2 diastolic dysfunction, normally functioning mitral valve prosthesis with trivial stenosis (2 mmHg), moderate to severe LAE, trivial TR  4 weeks Zio patch monitor in November 2020  Sinus bradycardia to sinus tachycardia.  No atrial fibrillation.  3 runs of nonsustained ventricular tachycardia's with longest lasting 7 beats.  15 very short runs of SVTs, most probably atrial tachycardia.  Cardiac MRI 03/17/19: IMPRESSION: 1. Normal LV size and systolic function, EF 82%. Mid inferolateral wall mild hypokinesis.  2. Normal RV size and systolic function, EF 80%.  3. Bioprosthetic mitral valve, unable to comment on this due to artifact.  4. Delayed enhancement pattern is suggestive of focal infarct (subendocardial LGE) in left circumflex territory (?OM territory).  EKG:  EKG 08/08/20: NSR   Recent Labs: 06/15/2020: TSH 0.603 08/06/2020: ALT 20 08/09/2020: Magnesium 2.0 08/10/2020: BUN 10; Creatinine, Ser 0.66; Hemoglobin 9.1; Platelets 177; Potassium 4.3; Sodium 131  Recent Lipid Panel    Component Value Date/Time   CHOL 116 08/07/2020 0050   CHOL 150 11/11/2018 0946   CHOL 177 10/28/2014 1025   TRIG 102 08/07/2020 0050   TRIG 115 10/28/2014 1025   HDL 46 08/07/2020 0050   HDL 70 11/11/2018 0946   HDL 70 10/28/2014 1025   CHOLHDL 2.5 08/07/2020 0050   VLDL 20 08/07/2020 0050   LDLCALC 50 08/07/2020 0050   LDLCALC 64 11/11/2018 0946   LDLCALC 84 10/28/2014 1025   LDLDIRECT 63 06/15/2020 1446     Physical Exam:    VS:  BP 130/90   Pulse 72   Ht 5' 6.5" (1.689 m)   Wt 140 lb 9.6 oz (63.8 kg)   SpO2 95%   BMI 22.35 kg/m     Wt Readings from Last 3 Encounters:  08/15/20 140 lb 9.6 oz (63.8 kg)  08/10/20 144 lb 3.2 oz (65.4 kg)  07/27/20 145 lb 6.4 oz (66 kg)     GEN:  Comfortable, NAD HEENT: Normal NECK: Right CEA site c/d/i CARDIAC: RRR, 1/6 systolic murmur.  No rubs, gallops RESPIRATORY:  Clear to auscultation without rales, wheezing or rhonchi  ABDOMEN: Soft, non-tender, non-distended MUSCULOSKELETAL:  No edema; No deformity  SKIN: Warm and dry NEUROLOGIC:  Alert  and oriented x 3 PSYCHIATRIC:  Normal affect   ASSESSMENT:    1. NSTEMI (non-ST elevated myocardial infarction) ([place])   2. Mitral valve insufficiency, unspecified etiology   3. Hyperlipidemia LDL goal <70   4. Palpitations   5. History of mitral valve replacement   6. Bilateral carotid artery stenosis   7. Orthostatic hypertension   8. TIA (transient ischemic attack)    PLAN:    In order of problems listed above:  #History of NSTEMI (non-ST elevated myocardial infarction)  Presented with chest pain in 2020. Cath without obstructive disease, however, MRI showed focal LGE in the mid inferolateral wall highly suspicious for thromboembolic event. TTE with LVEF was 60 to 65%. Completed cardiac rehab. Had mild troponin elevation with recent hospitalization with peak 140-->130. No anginal symptoms. Suspect demand in the setting of TIA. Given recent negative cath and absence of chest pain, no further work-up needed at this time -Resumed ASA 81mg  daily given TIA/CEA -Continue plavix 75mg  daily -Change atorva to crestor -Continue metoprolol succinate 12.5mg  XL at night -Stopped linisopril due to orthostasis  #Mitral valve insufficiency, 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 TTE 08/08/20 with LVEF 60-65%, mitral valve mean gradient 10mmHg, mild MR. Doing well and compensated on examination -Continue SBE prophylaxis -Surveillance TTE in 1-2 years  #Orthostatic Hypotension: Patient with lightheadedness with changing positions in the setting of prior OH and recent right CEA. No syncope. Spent significant time talking about hydration, compression stockings and slow position changes to help with symptoms. -Compression socks -Increase hydration -Elevate  legs at end of the day -Slow position changes  #Concern for TIA: #Bilateral carotid artery stenosis Patient with recent presentation for acute onset dizziness, HA, and LUE incoordination. CT angiogram head and neck  showed bulky calcified plaque about proximal ICAs bilaterally with associated stenosis of up to 65% on the right, 50% on the left now s/p R CEA with [name] on 08/09/20. -Continue ASA 81mg  daily -Continue plavix 75mg  daily -Change to crestor as above -Follow-up with [name] as scheduled  #Hyperlipidemia LDL goal <70 LDL optimal on most recent lab work with LDL 63  -Change to crestor 20mg  daily as above  #Palpitations: Resolved. Monitor in 2020 with brief runs of SVT (? Atach). No Afib or significant sustained arrhythmia. -Continue metop as above   Medication Adjustments/Labs and Tests Ordered: Current medicines are reviewed at length with the patient today.  Concerns regarding medicines are outlined above.  No orders of the defined types were placed in this encounter.  Meds ordered this encounter  Medications  . rosuvastatin (CRESTOR) 20 MG tablet    Sig: Take 1 tablet (20 mg total) by mouth daily.    Dispense:  90 tablet    Refill:  3    Patient Instructions  Medication Instructions:  Your physician has recommended you make the following change in your medication:  1) STOP taking atorvastatin 2) START taking Crestor (rosuvastatin) 20 mg daily  *If you need a refill on your cardiac medications before your next appointment, please call your pharmacy*  Follow-Up: At [facility], you and your health needs are our priority.  As part of our continuing mission to provide you with exceptional heart care, we have created designated Provider Care Teams.  These Care Teams include your primary Cardiologist (physician) and Advanced Practice Providers (APPs -  Physician Assistants and Nurse Practitioners) who all work together to provide you with the care you need,  when you need it.  Your next appointment:   1 month(s)  The format for your next appointment:   In Person  Provider:   You may see Freada Bergeron, MD or one of the following Advanced Practice Providers on your designated Care Team:    Melina Copa, PA-C  Ermalinda Barrios, PA-C      Signed, Freada Bergeron, MD  08/15/2020 10:11 AM    [place]

## 2020-08-14 DIAGNOSIS — J449 Chronic obstructive pulmonary disease, unspecified: Secondary | ICD-10-CM | POA: Diagnosis not present

## 2020-08-14 DIAGNOSIS — I5032 Chronic diastolic (congestive) heart failure: Secondary | ICD-10-CM | POA: Diagnosis not present

## 2020-08-14 DIAGNOSIS — I110 Hypertensive heart disease with heart failure: Secondary | ICD-10-CM | POA: Diagnosis not present

## 2020-08-14 DIAGNOSIS — I6521 Occlusion and stenosis of right carotid artery: Secondary | ICD-10-CM | POA: Diagnosis not present

## 2020-08-14 DIAGNOSIS — Z48812 Encounter for surgical aftercare following surgery on the circulatory system: Secondary | ICD-10-CM | POA: Diagnosis not present

## 2020-08-14 DIAGNOSIS — G459 Transient cerebral ischemic attack, unspecified: Secondary | ICD-10-CM | POA: Diagnosis not present

## 2020-08-15 ENCOUNTER — Other Ambulatory Visit: Payer: Self-pay

## 2020-08-15 ENCOUNTER — Other Ambulatory Visit: Payer: Self-pay | Admitting: Cardiology

## 2020-08-15 ENCOUNTER — Encounter: Payer: Self-pay | Admitting: Cardiology

## 2020-08-15 ENCOUNTER — Ambulatory Visit (INDEPENDENT_AMBULATORY_CARE_PROVIDER_SITE_OTHER): Payer: Medicare Other | Admitting: Cardiology

## 2020-08-15 VITALS — BP 130/90 | HR 72 | Ht 66.5 in | Wt 140.6 lb

## 2020-08-15 DIAGNOSIS — R002 Palpitations: Secondary | ICD-10-CM | POA: Diagnosis not present

## 2020-08-15 DIAGNOSIS — G459 Transient cerebral ischemic attack, unspecified: Secondary | ICD-10-CM | POA: Diagnosis not present

## 2020-08-15 DIAGNOSIS — I6523 Occlusion and stenosis of bilateral carotid arteries: Secondary | ICD-10-CM | POA: Diagnosis not present

## 2020-08-15 DIAGNOSIS — Z48812 Encounter for surgical aftercare following surgery on the circulatory system: Secondary | ICD-10-CM | POA: Diagnosis not present

## 2020-08-15 DIAGNOSIS — I340 Nonrheumatic mitral (valve) insufficiency: Secondary | ICD-10-CM

## 2020-08-15 DIAGNOSIS — E785 Hyperlipidemia, unspecified: Secondary | ICD-10-CM | POA: Diagnosis not present

## 2020-08-15 DIAGNOSIS — Z952 Presence of prosthetic heart valve: Secondary | ICD-10-CM

## 2020-08-15 DIAGNOSIS — I5032 Chronic diastolic (congestive) heart failure: Secondary | ICD-10-CM | POA: Diagnosis not present

## 2020-08-15 DIAGNOSIS — I10 Essential (primary) hypertension: Secondary | ICD-10-CM

## 2020-08-15 DIAGNOSIS — I110 Hypertensive heart disease with heart failure: Secondary | ICD-10-CM | POA: Diagnosis not present

## 2020-08-15 DIAGNOSIS — I6521 Occlusion and stenosis of right carotid artery: Secondary | ICD-10-CM | POA: Diagnosis not present

## 2020-08-15 DIAGNOSIS — J449 Chronic obstructive pulmonary disease, unspecified: Secondary | ICD-10-CM | POA: Diagnosis not present

## 2020-08-15 DIAGNOSIS — I214 Non-ST elevation (NSTEMI) myocardial infarction: Secondary | ICD-10-CM | POA: Diagnosis not present

## 2020-08-15 MED ORDER — ROSUVASTATIN CALCIUM 20 MG PO TABS: 20.0000 mg | ORAL_TABLET | Freq: Every day | ORAL | 3 refills | Status: DC

## 2020-08-15 NOTE — Patient Instructions (Signed)
Medication Instructions:  Your physician has recommended you make the following change in your medication:  1) STOP taking atorvastatin 2) START taking Crestor (rosuvastatin) 20 mg daily  *If you need a refill on your cardiac medications before your next appointment, please call your pharmacy*  Follow-Up: At [facility], you and your health needs are our priority.  As part of our continuing mission to provide you with exceptional heart care, we have created designated Provider Care Teams.  These Care Teams include your primary Cardiologist (physician) and Advanced Practice Providers (APPs -  Physician Assistants and Nurse Practitioners) who all work together to provide you with the care you need, when you need it.  Your next appointment:   1 month(s)  The format for your next appointment:   In Person  Provider:   You may see Freada Bergeron, MD or one of the following Advanced Practice Providers on your designated Care Team:    Melina Copa, PA-C  Ermalinda Barrios, PA-C

## 2020-08-16 ENCOUNTER — Telehealth: Payer: Self-pay

## 2020-08-16 DIAGNOSIS — I5032 Chronic diastolic (congestive) heart failure: Secondary | ICD-10-CM | POA: Diagnosis not present

## 2020-08-16 DIAGNOSIS — I6521 Occlusion and stenosis of right carotid artery: Secondary | ICD-10-CM | POA: Diagnosis not present

## 2020-08-16 DIAGNOSIS — I110 Hypertensive heart disease with heart failure: Secondary | ICD-10-CM | POA: Diagnosis not present

## 2020-08-16 DIAGNOSIS — Z48812 Encounter for surgical aftercare following surgery on the circulatory system: Secondary | ICD-10-CM | POA: Diagnosis not present

## 2020-08-16 DIAGNOSIS — G459 Transient cerebral ischemic attack, unspecified: Secondary | ICD-10-CM | POA: Diagnosis not present

## 2020-08-16 DIAGNOSIS — J449 Chronic obstructive pulmonary disease, unspecified: Secondary | ICD-10-CM | POA: Diagnosis not present

## 2020-08-16 NOTE — Telephone Encounter (Signed)
Patient's RN called to report the patient has had a right sided headache since d/c from the hospital. It is about a 3/10 and she is taking Tylenol for it. She is s/p R CEA on 08/09/20. Her BP has not exceeded 130/80 and has generally been running around the teens. She says her eyes get a little blurry when she looks at her phone too long, but denies any other s/s. She confirmed understanding of s/s of stroke. Placed on [facility] schedule Friday.

## 2020-08-17 ENCOUNTER — Encounter: Payer: Self-pay | Admitting: Physician Assistant

## 2020-08-17 ENCOUNTER — Other Ambulatory Visit: Payer: Self-pay

## 2020-08-17 ENCOUNTER — Ambulatory Visit (INDEPENDENT_AMBULATORY_CARE_PROVIDER_SITE_OTHER): Payer: Medicare Other | Admitting: Physician Assistant

## 2020-08-17 VITALS — BP 123/70 | HR 73 | Temp 98.0°F | Ht 66.0 in | Wt 143.7 lb

## 2020-08-17 DIAGNOSIS — S92919A Unspecified fracture of unspecified toe(s), initial encounter for closed fracture: Secondary | ICD-10-CM | POA: Insufficient documentation

## 2020-08-17 DIAGNOSIS — E039 Hypothyroidism, unspecified: Secondary | ICD-10-CM

## 2020-08-17 DIAGNOSIS — I6521 Occlusion and stenosis of right carotid artery: Secondary | ICD-10-CM

## 2020-08-17 DIAGNOSIS — E785 Hyperlipidemia, unspecified: Secondary | ICD-10-CM | POA: Diagnosis not present

## 2020-08-17 DIAGNOSIS — G459 Transient cerebral ischemic attack, unspecified: Secondary | ICD-10-CM

## 2020-08-17 DIAGNOSIS — Z09 Encounter for follow-up examination after completed treatment for conditions other than malignant neoplasm: Secondary | ICD-10-CM | POA: Diagnosis not present

## 2020-08-17 NOTE — Progress Notes (Signed)
Established Patient Office Visit  Subjective:  Patient ID: Joy Williams, female    DOB: Jul 28, 1950  Age: 70 y.o. MRN: [id number]  CC:  Chief Complaint  Patient presents with  . Hospitalization Follow-up    HPI Joy Williams presents for hospital follow up. Patient reports continues to have right sided headache and blurred vision. States nothing makes the headache worse. Putting pressure provides some pain relief. Denies prior history of migraine. Patient is s/p CEA by Joy Williams. Taking Tylenol about every 6 hours which provides pain relief at the incision site. Reports lightheadedness has improved. Is wearing compression socks and trying to stay hydrated as recommended by Cardiology. Denies chest pain, palpitations, slurred speech, or paresthesia. Has an upcoming appointment with Joy Williams tomorrow. Has been monitoring blood pressure at home and reports readings average in the 120s/60s.  Discharge summary:  Admit date: 08/06/2020 Discharge date: 08/10/2020  Admitted From: home Discharge disposition: home   Recommendations for Outpatient Follow-Up:   1. Asa plus plavix for 3 weeks then plavix alone (discussed with Joy Williams) 2. Smoking cessation   Discharge Diagnosis:   Principal Problem:   TIA (transient ischemic attack) Active Problems:   Chronic diastolic congestive heart failure (HCC)   Chronic obstructive pulmonary disease (HCC)   Hypothyroidism   Tobacco abuse   Anemia   Symptomatic stenosis of right carotid artery    Discharge Condition: Improved.  Diet recommendation: Low sodium, heart healthy.  Wound care: None.  Code status: Full.   History of Present Illness:   [name] a 70 y.o.femalewith medical history significant ofchronic diastolic heart failure, bilateral carotid artery stenosis, CAD, COPD, scleroderma, cold agglutinin disease,Raynaud's syndrome, telangiectasia, severe mitral regurgitation status post  bioprosthetic MVR in 2018, hypothyroidism, COPD, history of lung cancer, history of TIA presented to the ED for evaluation of acute onset dizziness, left arm incoordination, and headache.Patient states this afternoon around 3:30 PM she was outside with her granddaughter and all of a sudden felt very dizzy and unsteady. States her left arm was numb and moving when she did not want it to move. It felt like she could not control her arm. She also had a severe headache and nausea at that time. She had tightness in her chest. Her granddaughter helped her sit on a chair and symptoms resolved in about 25 to 30 minutes except she was still having a headache when she came to the emergency room but it has now resolved after receiving Dilaudid and magnesium. She is fully vaccinated against COVID including booster shot. Reports having a chronic cough due to allergies, no recent change. Denies fevers, vomiting, abdominal pain, or diarrhea. States she was previously smoking heavily but cut down on cigarette smoking after she had lung cancer in 2016. At present she continues to smoke 4 to 5 cigarettes a day.    Hospital Course by Problem:   complicated migraine versus TIA/symptomatic right carotid artery disease -Patient had presented with evaluation of sudden onset dizziness, left upper extremity incoordination, headache, gait abnormalities which lasted about 25 to 30 minutes and subsequently resolved. -Head CT done negative for any acute abnormalities. -MRI brain negative for any acute abnormalities, multiple scattered remote lacunar infarcts involving bilateral cerebellar hemispheres, left thalamus, right caudate. 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 -2D echo with EF 65 to 70%, NWMA, mild  LVH, moderate left atrial size dilated, mild MVR, no source of emboli noted.  -Risk factor modification  -Continue statin, Plavix, aspirin.  -Neurology consulted and are following.  -Vascular surgery, Dr Donzetta Williams s/p right CEA: 08/09/2020.   Chronic diastolic heart failure -Compensated.  -Continue home regimen Toprol-XL.  -Monitor volume status closely.   COPD Stable. Home trelegy.  Hypothyroidism -TSH 0.603 (06/15/2020 ) -Continue Synthroid.  -Outpatient follow-up.   Tobacco abuse Tobacco cessation. Nicotine patch. -encourage cessation  Hyperlipidemia -LDL 50.  -Continue statin.   Coronary artery disease/elevated troponin -Patient noted to have reported some chest tightness at the same time as a neurological symptoms which has since resolved. -EKG with no ischemic changes noted. -Cardiac enzymes elevated but flattened.  -2D echo with EF 65 to 70%, NWMA, mild LVH, normal right ventricular systolic function, moderately dilated left atrial size, mild MVR.  -No further cardiac work-up needed.  Continue aspirin, Plavix, Toprol-XL statin -Outpatient follow-up.     Medical Consultants:    Neuro vascular  Discharge Exam:       Vitals:   08/10/20 0357 08/10/20 0619  BP: 126/60   Pulse: 70 68  Resp: 13 19  Temp: 98 F (36.7 C)   SpO2: 99% 100%         Vitals:   08/09/20 2346 08/09/20 2347 08/10/20 0357 08/10/20 0619  BP:   126/60   Pulse: 68 66 70 68  Resp: 16 19 13 19   Temp:   98 F (36.7 C)   TempSrc:   Oral   SpO2: 100% 100% 99% 100%  Weight:    65.4 kg  Height:        General exam: Appears calm and comfortable.   The results of significant diagnostics from this hospitalization (including imaging, microbiology, ancillary and laboratory) are listed below for reference.     Procedures and Diagnostic Studies:   CT Angio Head W or Wo Contrast  Result Date: 08/06/2020 CLINICAL DATA:  Initial  evaluation for acute onset dizziness. EXAM: CT ANGIOGRAPHY HEAD AND NECK TECHNIQUE: Multidetector CT imaging of the head and neck was performed using the standard protocol during bolus administration of intravenous contrast. Multiplanar CT image reconstructions and MIPs were obtained to evaluate the vascular anatomy. Carotid stenosis measurements (when applicable) are obtained utilizing NASCET criteria, using the distal internal carotid diameter as the denominator. CONTRAST:  25mL OMNIPAQUE IOHEXOL 350 MG/ML SOLN COMPARISON:  Prior head CT from earlier the same day. Comparison made with prior CTA from 03/11/2018 as well. FINDINGS: CTA NECK FINDINGS Aortic arch: Visualized aortic arch normal in caliber with normal 3 vessel morphology. Mild-to-moderate atheromatous change about the arch and origin of the great vessels without hemodynamically significant stenosis. Visualized subclavian arteries widely patent. Right carotid system: Right CCA patent from its origin to the bifurcation without stenosis. Bulky calcified plaque about the proximal right ICA with associated stenosis of up to 65% by NASCET criteria. Streak artifact partially obscures this region. Right ICA patent distally to the skull base. Left carotid system: Left CCA patent from its origin to the bifurcation without stenosis. Bulky calcified plaque about the left carotid bulb/proximal left ICA with associated stenosis of up to 50% by NASCET criteria. Left ICA patent distally to the skull base. Vertebral arteries: Both vertebral arteries arise from the subclavian arteries. Strongly dominant left vertebral artery with a diffusely hypoplastic right vertebral artery. No significant proximal subclavian artery stenosis. Vertebral arteries widely patent within  the neck without stenosis, dissection or occlusion. Skeleton: No visible acute osseous finding. No discrete or worrisome osseous lesions. Degenerative grade 1 anterolisthesis of C3 on C4 and C4 on C5, with  advanced degenerative spondylosis at C5-6 and C6-7. Other neck: No other acute soft tissue abnormality within neck. No mass or adenopathy. 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 Associated asymmetric sclerosis and medialization of the left arytenoid cartilage. No visible discrete lesion. Upper chest: Postsurgical changes present at the partially visualized left lung. 1.2 cm prevascular node noted. Additional 1.3 cm precarinal node. Asymmetric soft tissue fullness within the visualized left hilum. Review of the MIP images confirms the above findings CTA HEAD FINDINGS Anterior circulation: Petrous segments patent bilaterally. Mild atheromatous change within the carotid siphons without significant stenosis. A1 segments patent bilaterally. Normal anterior communicating artery complex. Anterior cerebral arteries patent to their distal aspects without stenosis. No M1 stenosis or occlusion. Normal MCA bifurcations. Distal MCA branches well perfused and symmetric. Posterior circulation: Dominant left vertebral artery patent to the vertebrobasilar junction without stenosis. Hypoplastic right vertebral artery largely terminates in PICA, although a tiny branch ascending towards the vertebrobasilar junction. Left PICA not visualized. Basilar diminutive but widely patent to its distal aspect. Dominant left AICA. Superior cerebellar arteries patent bilaterally. Predominant fetal type origin of both PCAs. PCAs well perfused and patent to their distal aspects. Venous sinuses: Patent. Anatomic variants: Predominant fetal type origin of the PCAs. Hypoplastic right vertebral artery largely terminates in PICA. No aneurysm. Review of the MIP images confirms the above findings IMPRESSION: 1. Negative CTA for large vessel occlusion. 2. Bulky calcified plaque about the proximal ICAs bilaterally, with associated stenoses of up to 65% on the right and 50% on the left. 3. Predominant fetal  type origin of the PCAs with overall diminutive vertebrobasilar system. Hypoplastic right vertebral artery largely terminates in PICA. 4. Few mildly enlarged mediastinal lymph nodes with asymmetric soft tissue fullness at the left hilum and post treatment changes within the partially visualized left lung. Findings are overall similar as compared to previous CTA from 2019. 5. 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 Electronically Signed   By: Jeannine Boga M.D.   On: 08/06/2020 23:36   CT HEAD WO CONTRAST  Result Date: 08/06/2020 CLINICAL DATA:  Neuro deficit, acute, stroke suspected. Additional history provided: Bilateral ear pain/pressure, headache. EXAM: CT HEAD WITHOUT CONTRAST TECHNIQUE: Contiguous axial images were obtained from the base of the skull through the vertex without intravenous contrast. COMPARISON:  Brain MRI 03/12/2018. FINDINGS: Brain: Cerebral volume is normal for age. Lacunar infarct within the left basal ganglia, new as compared to the brain MRI of 03/12/2018 but otherwise age indeterminate (series 3, image 13) (series 5, image 24). Known small infarcts within the left thalamus and left cerebellar hemisphere better appreciated on the prior brain MRI of 03/12/2018. There is no acute intracranial hemorrhage. No demarcated cortical infarct. No extra-axial fluid collection. No evidence of intracranial mass. No midline shift. Vascular: No hyperdense vessel.  Atherosclerotic calcifications Skull: Normal. Negative for fracture or focal lesion. Sinuses/Orbits: Visualized orbits show no acute finding. No significant paranasal sinus disease at the imaged levels. IMPRESSION: No evidence of acute intracranial hemorrhage or acute demarcated cortical infarction. A lacunar infarct within the left basal ganglia is new as compared to the brain MRI of 03/12/2018, but otherwise age indeterminate. Known small infarcts within the left thalamus and left  cerebellar  hemisphere were better appreciated on the prior brain MRI of 03/12/2018. Electronically Signed   By: Kellie Simmering DO   On: 08/06/2020 18:30   CT Angio Neck W and/or Wo Contrast  Result Date: 08/06/2020 CLINICAL DATA:  Initial evaluation for acute onset dizziness. EXAM: CT ANGIOGRAPHY HEAD AND NECK TECHNIQUE: Multidetector CT imaging of the head and neck was performed using the standard protocol during bolus administration of intravenous contrast. Multiplanar CT image reconstructions and MIPs were obtained to evaluate the vascular anatomy. Carotid stenosis measurements (when applicable) are obtained utilizing NASCET criteria, using the distal internal carotid diameter as the denominator. CONTRAST:  25mL OMNIPAQUE IOHEXOL 350 MG/ML SOLN COMPARISON:  Prior head CT from earlier the same day. Comparison made with prior CTA from 03/11/2018 as well. FINDINGS: CTA NECK FINDINGS Aortic arch: Visualized aortic arch normal in caliber with normal 3 vessel morphology. Mild-to-moderate atheromatous change about the arch and origin of the great vessels without hemodynamically significant stenosis. Visualized subclavian arteries widely patent. Right carotid system: Right CCA patent from its origin to the bifurcation without stenosis. Bulky calcified plaque about the proximal right ICA with associated stenosis of up to 65% by NASCET criteria. Streak artifact partially obscures this region. Right ICA patent distally to the skull base. Left carotid system: Left CCA patent from its origin to the bifurcation without stenosis. Bulky calcified plaque about the left carotid bulb/proximal left ICA with associated stenosis of up to 50% by NASCET criteria. Left ICA patent distally to the skull base. Vertebral arteries: Both vertebral arteries arise from the subclavian arteries. Strongly dominant left vertebral artery with a diffusely hypoplastic right vertebral artery. No significant proximal subclavian artery stenosis.  Vertebral arteries widely patent within the neck without stenosis, dissection or occlusion. Skeleton: No visible acute osseous finding. No discrete or worrisome osseous lesions. Degenerative grade 1 anterolisthesis of C3 on C4 and C4 on C5, with advanced degenerative spondylosis at C5-6 and C6-7. Other neck: No other acute soft tissue abnormality within neck. No mass or adenopathy. 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 Associated asymmetric sclerosis and medialization of the left arytenoid cartilage. No visible discrete lesion. Upper chest: Postsurgical changes present at the partially visualized left lung. 1.2 cm prevascular node noted. Additional 1.3 cm precarinal node. Asymmetric soft tissue fullness within the visualized left hilum. Review of the MIP images confirms the above findings CTA HEAD FINDINGS Anterior circulation: Petrous segments patent bilaterally. Mild atheromatous change within the carotid siphons without significant stenosis. A1 segments patent bilaterally. Normal anterior communicating artery complex. Anterior cerebral arteries patent to their distal aspects without stenosis. No M1 stenosis or occlusion. Normal MCA bifurcations. Distal MCA branches well perfused and symmetric. Posterior circulation: Dominant left vertebral artery patent to the vertebrobasilar junction without stenosis. Hypoplastic right vertebral artery largely terminates in PICA, although a tiny branch ascending towards the vertebrobasilar junction. Left PICA not visualized. Basilar diminutive but widely patent to its distal aspect. Dominant left AICA. Superior cerebellar arteries patent bilaterally. Predominant fetal type origin of both PCAs. PCAs well perfused and patent to their distal aspects. Venous sinuses: Patent. Anatomic variants: Predominant fetal type origin of the PCAs. Hypoplastic right vertebral artery largely terminates in PICA. No aneurysm. Review of the MIP  images confirms the above findings IMPRESSION: 1. Negative CTA for large vessel occlusion. 2. Bulky calcified plaque about the proximal ICAs bilaterally, with associated stenoses of up to 65% on the right and 50% on the left. 3. Predominant  fetal type origin of the PCAs with overall diminutive vertebrobasilar system. Hypoplastic right vertebral artery largely terminates in PICA. 4. Few mildly enlarged mediastinal lymph nodes with asymmetric soft tissue fullness at the left hilum and post treatment changes within the partially visualized left lung. Findings are overall similar as compared to previous CTA from 2019. 5. 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 Electronically Signed   By: Jeannine Boga M.D.   On: 08/06/2020 23:36   MR BRAIN WO CONTRAST  Result Date: 08/07/2020 CLINICAL DATA:  Initial evaluation for neuro deficit, stroke suspected. EXAM: MRI HEAD WITHOUT CONTRAST TECHNIQUE: Multiplanar, multiecho pulse sequences of the brain and surrounding structures were obtained without intravenous contrast. COMPARISON:  Prior CTs from 08/06/2020 as well as previous MRI from 03/12/2018. FINDINGS: Brain: Cerebral volume within normal limits for age. Multiple scattered remote lacunar type infarcts seen about the bilateral cerebellar hemispheres and left thalamus. Additional tiny remote lacunar infarct at the right caudate. Overall, appearance has progressed as compared to previous MRI from 2019. No abnormal foci of restricted diffusion to suggest acute or subacute ischemia. Gray-white matter differentiation otherwise maintained. No areas of remote cortical infarction. No acute intracranial hemorrhage. Few punctate foci of susceptibility artifact noted, consistent with small chronic micro hemorrhages, suspected to be hypertensive in nature. No mass lesion, midline shift or mass effect. No hydrocephalus or extra-axial fluid collection. Pituitary gland and  suprasellar region normal. Midline structures intact. Vascular: Major intracranial vascular flow voids are maintained. Skull and upper cervical spine: Craniocervical junction within normal limits. Bone marrow signal intensity normal. No scalp soft tissue abnormality. Sinuses/Orbits: Globes and orbital soft tissues demonstrate no acute finding. Paranasal sinuses are largely clear. No mastoid effusion. Inner ear structures grossly normal. Other: None. IMPRESSION: 1. No acute intracranial abnormality. 2. Multiple scattered remote lacunar infarcts involving the bilateral cerebellar hemispheres, left thalamus, and right caudate. Overall, appearance has progressed as compared to previous MRI from 2019. Electronically Signed   By: Jeannine Boga M.D.   On: 08/07/2020 02:53   ECHOCARDIOGRAM COMPLETE  Result Date: 08/07/2020    ECHOCARDIOGRAM REPORT   Patient Name:   GEORGA STYS Minkler Date of Exam: 08/07/2020 Medical Rec #:  [id number]        Height:       67.0 in Accession #:    [id number]       Weight:       145.0 lb Date of Birth:  [birth year]        BSA:          1.764 m Patient Age:    14 years         BP:           117/58 mmHg Patient Gender: F                HR:           77 bpm. Exam Location:  Inpatient Procedure: Cardiac Doppler and Color Doppler Indications:    TIA  History:        Patient has prior history of Echocardiogram examinations, most                 recent 02/25/2019.  Sonographer:    Merrie Roof RDCS Referring Phys: [id number] [place]  1. Left ventricular ejection fraction, by estimation, is 65 to 70%. The left ventricle has normal function. The left ventricle has no regional wall motion abnormalities. There is mild left ventricular  hypertrophy.  2. Right ventricular systolic function is normal. The right ventricular size is normal.  3. Left atrial size was moderately dilated.  4. Mild mitral valve regurgitation. Moderate mitral annular calcification.  5. The aortic valve is  tricuspid. Aortic valve regurgitation is not visualized. Mild aortic valve sclerosis is present, with no evidence of aortic valve stenosis.  6. The inferior vena cava is normal in size with greater than 50% respiratory variability, suggesting right atrial pressure of 3 mmHg. Comparison(s): The left ventricular function is unchanged. FINDINGS  Left Ventricle: Left ventricular ejection fraction, by estimation, is 65 to 70%. The left ventricle has normal function. The left ventricle has no regional wall motion abnormalities. The left ventricular internal cavity size was normal in size. There is  mild left ventricular hypertrophy. Right Ventricle: The right ventricular size is normal. Right vetricular wall thickness was not assessed. Right ventricular systolic function is normal. Left Atrium: Left atrial size was moderately dilated. Right Atrium: Right atrial size was normal in size. Pericardium: There is no evidence of pericardial effusion. Mitral Valve: Moderate mitral annular calcification. Mild mitral valve regurgitation. MV peak gradient, 5.6 mmHg. The mean mitral valve gradient is 2.0 mmHg. Tricuspid Valve: The tricuspid valve is normal in structure. Tricuspid valve regurgitation is mild. Aortic Valve: The aortic valve is tricuspid. Aortic valve regurgitation is not visualized. Mild aortic valve sclerosis is present, with no evidence of aortic valve stenosis. Aortic valve mean gradient measures 6.0 mmHg. Aortic valve peak gradient measures 12.7 mmHg. Aortic valve area, by VTI measures 2.07 cm. Pulmonic Valve: The pulmonic valve was normal in structure. Pulmonic valve regurgitation is trivial. Aorta: The aortic root and ascending aorta are structurally normal, with no evidence of dilitation. Venous: The inferior vena cava is normal in size with greater than 50% respiratory variability, suggesting right atrial pressure of 3 mmHg. IAS/Shunts: No atrial level shunt detected by color flow Doppler.  LEFT VENTRICLE PLAX  2D LVIDd:         3.50 cm LVIDs:         2.30 cm LV PW:         1.30 cm LV IVS:        1.30 cm LVOT diam:     1.80 cm LV SV:         77 LV SV Index:   43 LVOT Area:     2.54 cm  RIGHT VENTRICLE          IVC RV Basal diam:  3.60 cm  IVC diam: 1.20 cm LEFT ATRIUM           Index       RIGHT ATRIUM           Index LA diam:      3.60 cm 2.04 cm/m  RA Area:     16.00 cm LA Vol (A4C): 78.3 ml 44.39 ml/m RA Volume:   38.80 ml  22.00 ml/m  AORTIC VALVE AV Area (Vmax):    2.16 cm AV Area (Vmean):   2.12 cm AV Area (VTI):     2.07 cm AV Vmax:           178.00 cm/s AV Vmean:          121.000 cm/s AV VTI:            0.370 m AV Peak Grad:      12.7 mmHg AV Mean Grad:      6.0 mmHg LVOT Vmax:  151.00 cm/s LVOT Vmean:        101.000 cm/s LVOT VTI:          0.301 m LVOT/AV VTI ratio: 0.81  AORTA Ao Root diam: 3.10 cm Ao Asc diam:  3.10 cm MITRAL VALVE MV Area (PHT): 3.17 cm    SHUNTS MV Area VTI:   2.43 cm    Systemic VTI:  0.30 m MV Peak grad:  5.6 mmHg    Systemic Diam: 1.80 cm MV Mean grad:  2.0 mmHg MV Vmax:       1.18 m/s MV Vmean:      69.7 cm/s MV Decel Time: 239 msec MV E velocity: 98.10 cm/s MV A velocity: 89.50 cm/s MV E/A ratio:  1.10 Dorris Carnes MD Electronically signed by Dorris Carnes MD Signature Date/Time: 08/07/2020/5:00:45 PM    Final         Past Medical History:  Diagnosis Date  . Allergy   . Asthma    pt denies this, but is on Dulera  . Carotid artery occlusion   . Chronic diastolic congestive heart failure ([place])   . 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 COPD (chronic obstructive pulmonary disease) ([place])   . Family history of adverse reaction to anesthesia    Mother- very sensitive to medication  . Heart murmur    MVP  . Hypothyroidism   . 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  . MVP (mitral valve prolapse)   . PONV (postoperative nausea and vomiting)   . Raynaud's disease    Raynaud's  . Raynaud's syndrome   . 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   31 mm [facility] mitral bovine bioprosthetic tissue valve placed via right mini thoracotomy approach  . Severe mitral regurgitation 11/15/2014  . Shortness of breath dyspnea    with exertion  . STD (sexually transmitted disease)   . Telangiectasia   . Thyroid disease     Past Surgical History:  Procedure Laterality Date  . BACK SURGERY     x 3  Disectomy  . BREAST BIOPSY Left   . CARDIAC CATHETERIZATION N/A 03/08/2016   Procedure: Right/Left Heart Cath and Coronary Angiography;  Surgeon: Sherren Mocha, MD;  Location: [place] [facility];  Service: Cardiovascular;  Laterality: N/A;  . CLAVICLE SURGERY Left 2013   plate to left collar bone  . ENDARTERECTOMY Right 08/09/2020   Procedure: RIGHT CAROTID ENDARTERECTOMY;  Surgeon: Waynetta Sandy, MD;  Location: [place];  Service: Vascular;  Laterality: Right;  . LAPAROSCOPY     ? reason-age 57   . LEFT HEART CATH AND CORONARY ANGIOGRAPHY N/A 02/25/2019   Procedure: LEFT HEART CATH AND CORONARY ANGIOGRAPHY;  Surgeon: Jettie Booze, MD;  Location: [place] [facility];  Service: Cardiovascular;  Laterality: N/A;  . LUNG CANCER SURGERY    . MITRAL VALVE REPAIR Right 06/27/2016   Procedure: MINIMALLY INVASIVE MITRAL VALVE REPLACEMENT;  Surgeon: Rexene Alberts, MD;  Location: [place];  Service: Open Heart Surgery;  Laterality: Right;  . TEE WITHOUT CARDIOVERSION N/A 02/22/2016   Procedure: TRANSESOPHAGEAL ECHOCARDIOGRAM (TEE);  Surgeon: Dorothy Spark, MD;  Location: [facility];  Service: Cardiovascular;  Laterality: N/A;  . TEE WITHOUT  CARDIOVERSION N/A 06/27/2016   Procedure: TRANSESOPHAGEAL ECHOCARDIOGRAM (TEE);  Surgeon: Rexene Alberts, MD;  Location: [place];   Service: Open Heart Surgery;  Laterality: N/A;  . TONSILLECTOMY    . VIDEO ASSISTED THORACOSCOPY (VATS)/WEDGE RESECTION Left 12/30/2014   Bronchoscopy, Mediastinoscopy, Left VATS for Wedge resection LUL x2 adn LLL x1 - Dr. Elenor Quinones at [facility]  . VOCAL CORD INJECTION Left 2017   injected with botox  . wrist surgery Left 2015   plate to wrist     Family History  Problem Relation Age of Onset  . Mitral valve prolapse Mother   . Dementia Father   . Prostate cancer Father   . Mitral valve prolapse Brother   . Mitral valve prolapse Sister   . Colon cancer Neg Hx   . Colon polyps Neg Hx   . Rectal cancer Neg Hx   . Esophageal cancer Neg Hx   . Stomach cancer Neg Hx   . Heart attack Neg Hx     Social History   Socioeconomic History  . Marital status: Divorced    Spouse name: Not on file  . Number of children: Not on file  . Years of education: 8  . Highest education level: Not on file  Occupational History  . Occupation: Retired  Tobacco Use  . Smoking status: Former Smoker    Packs/day: 0.25    Years: 24.00    Pack years: 6.00    Types: E-cigarettes, Cigarettes    Start date: 05/21/1983    Quit date: 08/26/2019    Years since quitting: 0.9  . Smokeless tobacco: Never Used  . Tobacco comment: Patient is interested in trying nicotene gum  Vaping Use  . Vaping Use: Former  . Quit date: 05/20/2006  Substance and Sexual Activity  . Alcohol use: Yes    Alcohol/week: 2.0 standard drinks    Types: 1 Shots of liquor, 1 Standard drinks or equivalent per week  . Drug use: No  . Sexual activity: Never    Partners: Male    Birth control/protection: Post-menopausal  Other Topics Concern  . Not on file  Social History Narrative  . Not on file   Social Determinants of Health   Financial Resource Strain: Not on file  Food Insecurity: Not on file  Transportation Needs: Not on file  Physical Activity: Not on file  Stress: Not on file  Social Connections: Not on file  Intimate  Partner Violence: Not on file    Outpatient Medications Prior to Visit  Medication Sig Dispense Refill  . acetaminophen (TYLENOL) 500 MG tablet Take 1,000 mg by mouth every 6 (six) hours as needed for mild pain or headache.    Marland Kitchen aspirin EC 81 MG EC tablet Take 1 tablet (81 mg total) by mouth daily for 21 days. Swallow whole. 30 tablet 11  . azelastine (ASTELIN) 0.1 % nasal spray Place 2 sprays into both nostrils as needed for rhinitis. Use in each nostril as directed    . clopidogrel (PLAVIX) 75 MG tablet TAKE 1 TABLET BY MOUTH EVERY DAY NEED OFFICE VISIT 90 tablet 2  . levothyroxine (SYNTHROID) 175 MCG tablet TAKE 1 TABLET BY MOUTH DAILY BEFORE BREAKFAST 90 tablet 1  . MELATONIN PO Take 1 tablet by mouth at bedtime as needed (sleep).    . metoprolol succinate (TOPROL XL) 25 MG 24 hr tablet Take 0.5 tablets (12.5 mg total) by mouth daily. 45 tablet 3  . Multiple Vitamins-Minerals (MULTIVITAMIN WITH MINERALS) tablet Take 1 tablet  by mouth daily.    . nicotine (NICODERM CQ - DOSED IN MG/24 HR) 7 mg/24hr patch Place 1 patch (7 mg total) onto the skin daily. 28 patch 0  . nitroGLYCERIN (NITROSTAT) 0.4 MG SL tablet Place 1 tablet (0.4 mg total) under the tongue every 5 (five) minutes as needed for chest pain. 25 tablet 3  . rosuvastatin (CRESTOR) 20 MG tablet Take 1 tablet (20 mg total) by mouth daily. 90 tablet 3  . traMADol (ULTRAM) 50 MG tablet Take 1 tablet (50 mg total) by mouth every 6 (six) hours as needed. 20 tablet 0  . TRELEGY ELLIPTA 100-62.5-25 MCG/INH AEPB TAKE 1 PUFF BY MOUTH EVERY DAY 60 each 11  . Turmeric 500 MG CAPS Take 500 mg by mouth daily.    . vitamin B-12 (CYANOCOBALAMIN) 1000 MCG tablet Take 1 tablet (1,000 mcg total) by mouth daily.     No facility-administered medications prior to visit.    Allergies  Allergen Reactions  . Penicillins Hives    Unknown, occurred as a child Has patient had a PCN reaction causing immediate rash, facial/tongue/throat swelling, SOB or  lightheadedness with hypotension: Yes Has patient had a PCN reaction causing severe rash involving mucus membranes or skin necrosis: No Has patient had a PCN reaction that required hospitalization  Has patient had a PCN reaction occurring within the last 10 years: No If all of the above answers are "NO", then may proceed with Cephalosporin use. No    ROS Review of Systems A fourteen system review of systems was performed and found to be positive as per HPI.  Objective:    Physical Exam General:  Well Developed, well nourished, in no acute distress  Neuro:  Alert and oriented,  extra-ocular muscles intact  HEENT:  Normocephalic, atraumatic, neck supple Skin:  CEA site (right) is healing well without erythema or drainage. Cardiac:  RRR, S1 S2 Respiratory:  ECTA B/L w/o wheezing, Not using accessory muscles, speaking in full sentences- unlabored. Vascular:  Ext warm, no cyanosis apprec.; cap RF less 2 sec. No edema noted (pt wearing compression socks) Psych:  No HI/SI, judgement and insight good, Euthymic mood. Full Affect.    BP 123/70   Pulse 73   Temp 98 F (36.7 C)   Ht 5\' 6"  (1.676 m)   Wt 143 lb 11.2 oz (65.2 kg)   SpO2 98%   BMI 23.19 kg/m  Wt Readings from Last 3 Encounters:  08/18/20 142 lb (64.4 kg)  08/17/20 143 lb 11.2 oz (65.2 kg)  08/15/20 140 lb 9.6 oz (63.8 kg)     Health Maintenance Due  Topic Date Due  . MAMMOGRAM  07/02/2020  . COVID-19 Vaccine (4 - Booster for Moderna series) 07/24/2020    There are no preventive care reminders to display for this patient.  Lab Results  Component Value Date   TSH 0.603 06/15/2020   Lab Results  Component Value Date   WBC 9.9 08/10/2020   HGB 9.1 (L) 08/10/2020   HCT 27.3 (L) 08/10/2020   MCV 83.7 08/10/2020   PLT 177 08/10/2020   Lab Results  Component Value Date   NA 131 (L) 08/10/2020   K 4.3 08/10/2020   CO2 25 08/10/2020   GLUCOSE 103 (H) 08/10/2020   BUN 10 08/10/2020   CREATININE 0.66  08/10/2020   BILITOT 0.3 08/06/2020   ALKPHOS 64 08/06/2020   AST 29 08/06/2020   ALT 20 08/06/2020   PROT 6.3 (L) 08/06/2020   ALBUMIN  3.5 08/06/2020   CALCIUM 9.3 08/10/2020   ANIONGAP 8 08/10/2020   GFR 86.76 10/28/2014   Lab Results  Component Value Date   CHOL 116 08/07/2020   Lab Results  Component Value Date   HDL 46 08/07/2020   Lab Results  Component Value Date   LDLCALC 50 08/07/2020   Lab Results  Component Value Date   TRIG 102 08/07/2020   Lab Results  Component Value Date   CHOLHDL 2.5 08/07/2020   Lab Results  Component Value Date   HGBA1C 5.6 08/07/2020      Assessment & Plan:   Problem List Items Addressed This Visit      Cardiovascular and Mediastinum   TIA (transient ischemic attack)   Symptomatic stenosis of right carotid artery     Endocrine   Hypothyroidism (Chronic)     Other   Hyperlipidemia LDL goal <70    Other Visit Diagnoses    Hospital discharge follow-up    -  [facility] discharge follow up, TIA, Symptomatic stenosis of right carotid artery:  -Follow up with Neurology and Vascular as scheduled. S/P right CEA. -Continue Aspirin and Plavix. -Reviewed recent cardiology consult and atorvastatin changed to rosuvastatin 20 mg. -Continue Tylenol as needed for pain relief. Recommend to follow up sooner with Neurology if headache worsens or fails to improve (pt denies prior hx of migraines). -Continue nicotine patch for tobacco cessation. -Recommend to continue to monitor blood pressure/pulse at home and stay hydrated. Cardiology discontinue Lisinopril due to dizziness and orthostatic hypotension. Continue metoprolol.  -Continue compression socks.  Hyperlipidemia: -Recent lipid panel: total cholesterol 116, triglycerides 102, HDL 46, LDL 50 -Continue current medication regimen. -Will continue to monitor [facility].  Hypothyroidism: -Last TSH normal.  -Continue current medication regimen. Will continue to  monitor.  No orders of the defined types were placed in this encounter.   Follow-up: Return in about 4 months (around 12/17/2020) for [place] .   Note:  This note was prepared with assistance of Dragon voice recognition software. Occasional wrong-word or sound-a-like substitutions may have occurred due to the inherent limitations of voice recognition software.  Lorrene Reid, PA-C

## 2020-08-18 ENCOUNTER — Ambulatory Visit (INDEPENDENT_AMBULATORY_CARE_PROVIDER_SITE_OTHER): Payer: Medicare Other | Admitting: Vascular Surgery

## 2020-08-18 ENCOUNTER — Encounter: Payer: Self-pay | Admitting: Vascular Surgery

## 2020-08-18 VITALS — BP 111/60 | HR 82 | Temp 97.7°F | Resp 20 | Ht 66.0 in | Wt 142.0 lb

## 2020-08-18 DIAGNOSIS — I6521 Occlusion and stenosis of right carotid artery: Secondary | ICD-10-CM

## 2020-08-18 MED ORDER — METHOCARBAMOL 500 MG PO TABS: 500.0000 mg | ORAL_TABLET | Freq: Four times a day (QID) | ORAL | 0 refills | Status: DC | PRN

## 2020-08-18 NOTE — Progress Notes (Signed)
    Subjective:     Patient ID: Joy Williams, female   DOB: [birth year], 70 y.o.   MRN: [id number]  HPI 70 year old female status post right carotid endarterectomy for symptomatic disease.  She now has headaches on the right side radiating from her ear and back.  She does not have any further symptoms of left upper extremity weakness or speech difficulty.  She does not have some continued imbalance but overall is doing well.  Incisions healing without issues.  She rates the headache as moderate to severe and constant nonradiating.   Review of Systems Right-sided headache    Objective:   Physical Exam Vitals:   08/18/20 1009 08/18/20 1011  BP: 107/71 111/60  Pulse: 82   Resp: 20   Temp: 97.7 F (36.5 C)   SpO2: 97%    Awake alert oriented On lab respirations Right neck incision healing well Moving all extremities without limitation    Assessment:     71 year old female status post right carotid endarterectomy with moderate to severe right-sided headaches.    Plan:     Headache appears to be postsurgical in nature.  I have sent Robaxin to her pharmacy.  She will follow-up in 9 months with carotid duplex.    Danell Verno C. Donzetta Matters, MD [facility] Office: [phone] Pager: [phone]

## 2020-08-22 ENCOUNTER — Other Ambulatory Visit: Payer: Self-pay

## 2020-08-22 DIAGNOSIS — I6521 Occlusion and stenosis of right carotid artery: Secondary | ICD-10-CM

## 2020-08-22 DIAGNOSIS — Z48812 Encounter for surgical aftercare following surgery on the circulatory system: Secondary | ICD-10-CM | POA: Diagnosis not present

## 2020-08-22 DIAGNOSIS — J449 Chronic obstructive pulmonary disease, unspecified: Secondary | ICD-10-CM | POA: Diagnosis not present

## 2020-08-22 DIAGNOSIS — I5032 Chronic diastolic (congestive) heart failure: Secondary | ICD-10-CM | POA: Diagnosis not present

## 2020-08-22 DIAGNOSIS — G459 Transient cerebral ischemic attack, unspecified: Secondary | ICD-10-CM | POA: Diagnosis not present

## 2020-08-22 DIAGNOSIS — I110 Hypertensive heart disease with heart failure: Secondary | ICD-10-CM | POA: Diagnosis not present

## 2020-08-23 ENCOUNTER — Ambulatory Visit (INDEPENDENT_AMBULATORY_CARE_PROVIDER_SITE_OTHER): Payer: Medicare Other | Admitting: Adult Health

## 2020-08-23 ENCOUNTER — Other Ambulatory Visit: Payer: Self-pay

## 2020-08-23 ENCOUNTER — Encounter: Payer: Self-pay | Admitting: Adult Health

## 2020-08-23 VITALS — BP 111/63 | HR 70 | Ht 66.0 in | Wt 142.0 lb

## 2020-08-23 DIAGNOSIS — I6521 Occlusion and stenosis of right carotid artery: Secondary | ICD-10-CM | POA: Diagnosis not present

## 2020-08-23 DIAGNOSIS — G459 Transient cerebral ischemic attack, unspecified: Secondary | ICD-10-CM

## 2020-08-23 DIAGNOSIS — R519 Headache, unspecified: Secondary | ICD-10-CM

## 2020-08-23 DIAGNOSIS — T8189XS Other complications of procedures, not elsewhere classified, sequela: Secondary | ICD-10-CM

## 2020-08-23 MED ORDER — TOPIRAMATE 25 MG PO TABS: 25.0000 mg | ORAL_TABLET | Freq: Every day | ORAL | 3 refills | Status: DC

## 2020-08-23 NOTE — Patient Instructions (Signed)
Recommend starting topiramate 25 mg nightly for headache prophylaxis. If headache continue after 1 week, please let me know and we can discuss further increasing dosage or let me know sooner if you have any difficulty tolerating  Continue aspirin 81 mg daily and clopidogrel 75 mg daily  and Crestor  for secondary stroke prevention  Continued use of both aspirin and Plavix are not recommended from a stroke standpoint as continued use of both agents does not lower your risk of recurrent stroke therefore risks would outweigh benefit.  You may need to continue on both from a cardiology perspective as in regards to your heart, the benefits may outweigh certain risks - please further discuss with your cardiologist prior to stopping  Continue to follow up with PCP regarding cholesterol and blood pressure management  Maintain strict control of hypertension with blood pressure goal below 130/90 and cholesterol with LDL cholesterol (bad cholesterol) goal below 70 mg/dL.      Followup in the future with me in 3 months or call earlier if needed      Thank you for coming to see [place] at [facility]. I hope we have been able to provide you high quality care today.  You may receive a patient satisfaction survey over the next few weeks. We would appreciate your feedback and comments so that we may continue to improve ourselves and the health of our patients.

## 2020-08-23 NOTE — Addendum Note (Signed)
Addended by: Mal Misty on: 08/23/2020 03:16 PM   Modules accepted: Orders

## 2020-08-23 NOTE — Progress Notes (Signed)
[facility] [address] [place]. [place] [address] [phone]       HOSPITAL FOLLOW UP NOTE  [name] Date of Birth:  October 30, 1950 Medical Record Number:  [id number]   Reason for Referral:  hospital stroke follow up    SUBJECTIVE:   CHIEF COMPLAINT:  Chief Complaint  Patient presents with  . Follow-up    RM 14 with daughter (Joy Williams) Pt is only having headaches on R side.    HPI:   [name] is a 70 y.o. female with history of chronic diastolic CHF, COPD, raynauds syndrome, telangectasia,severe mitral regurgitation, hypothyroidism, history of lung cancer who presented on 08/06/2020 with dizziness, left arm incoordination, and severe headache which lasted approximately 25 to 30 minutes except headache continued on presentation.  Personally reviewed hospitalization pertinent progress notes, lab work and imaging with summary provided.  Evaluated by [name] with MRI negative for acute stroke and possibly TIA with symptomatic right ICA stenosis of 37% although complicated migraine in DDx.  MRI showed multiple scattered remote lacunar infarcts involving bilateral cerebellar hemispheres, left thalamus and right caudate as well as left basal ganglia infarct which is new compared to prior imaging in 02/2018. VVS consulted for b/l ICA stenosis s/p R CEA by [name].  LDL 50 on atorvastatin 20 mg daily.  Other stroke risk factors include advanced age, tobacco use, EtOH use, CAD, severe mitral regurgitation s/p bioprosthetic MVR 2018 and chronic diastolic heart failure.  Evaluated by therapies and recommended outpatient PT for higher-level balance activities.  Possible TIA with symptomatic right ICA of 65% stenosis. Complicated migraine is also in DDx.   Recurrent episode similar to 02/2018 of dizziness, imbalance, left arm incoordination, not in control and headache.  CT head: No acute finding.  Age indeterminate lacunar infarct within the left basal  ganglia. Known small infarcts within the left thalamus and left cerebellar.  CTA head & neck: no LVO, Bulky calcified plaque about the proximal ICAs bilaterally, with associated stenoses of up to 65% on the right and 50% on the left.  MRI: No acute intracranial abnormality.  Multiple scattered remote lacunar infarcts involving the bilateral cerebellar hemispheres, left thalamus, and right caudate.  2D Echo EF 65-70%, mild mitral valve regurgitation  LDL 50  HgbA1c 5.6  VTE prophylaxis - Lovenox 40mg  daily  No antithrombotic prior to admission, now on aspirin 81 mg daily and clopidogrel 75 mg daily. Will continue DAPT for 3 weeks and then ASA alone.  Therapy recommendations:  No OT follow up, Outpatient PT (for higher level balance activities)  Disposition:  home  Today, 08/23/2020, Joy Williams is being seen for hospital follow-up accompanied by her daughter  Stable since discharge without new stroke/TIA symptoms Since CEA, she has experienced continued right-sided headaches describing it as sharp stabbing like pains that come and go but occur daily - was seen by vascular surgery and felt residual headaches like postprocedural and started on Robaxin - can only take at night due to fatigue but reports limited benefit Use of tylenol during the day without benefit  She was evaluated by [facility] PT and released  She has remained on both aspirin and Plavix -denies associated side effects Cardiology recently discontinued atorvastatin and started Crestor  Compliant on Crestor -denies associated side effects Blood pressure today 111/63 - monitors at home which has been stable Reports complete tobacco cessation since discharge  No further concerns at this time     ROS:   14 system  review of systems performed and negative with exception of those listed in HPI  PMH:  Past Medical History:  Diagnosis Date  . Allergy   . Asthma    pt denies this, but is on Dulera  . Carotid artery occlusion    . Chronic diastolic congestive heart failure ([place])   . 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 COPD (chronic obstructive pulmonary disease) ([place])   . Family history of adverse reaction to anesthesia    Mother- very sensitive to medication  . Heart murmur    MVP  . Hypothyroidism   . 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 MVP (mitral valve prolapse)   . PONV (postoperative nausea and vomiting)   . Raynaud's disease    Raynaud's  . Raynaud's syndrome   . 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 mitral bovine bioprosthetic tissue valve placed via right mini thoracotomy approach  . Severe mitral regurgitation 11/15/2014  . Shortness of breath dyspnea    with exertion  . STD (sexually transmitted disease)   . Telangiectasia   . Thyroid disease     PSH:  Past Surgical History:  Procedure Laterality Date  . BACK SURGERY     x 3  Disectomy  . BREAST BIOPSY Left   . CARDIAC CATHETERIZATION N/A 03/08/2016   Procedure: Right/Left Heart Cath and Coronary Angiography;  Surgeon: Sherren Mocha, MD;  Location: [place] [facility];  Service: Cardiovascular;  Laterality: N/A;  . CLAVICLE SURGERY Left 2013   plate to left collar bone  . ENDARTERECTOMY Right 08/09/2020   Procedure: RIGHT CAROTID ENDARTERECTOMY;  Surgeon: Waynetta Sandy, MD;  Location: [place];  Service: Vascular;  Laterality: Right;  . LAPAROSCOPY     ? reason-age 13   . LEFT HEART CATH AND CORONARY ANGIOGRAPHY N/A 02/25/2019   Procedure: LEFT HEART CATH AND CORONARY ANGIOGRAPHY;  Surgeon: Jettie Booze, MD;  Location: [place] [facility];  Service: Cardiovascular;  Laterality: N/A;  . LUNG CANCER SURGERY    .  MITRAL VALVE REPAIR Right 06/27/2016   Procedure: MINIMALLY INVASIVE MITRAL VALVE REPLACEMENT;  Surgeon: Rexene Alberts, MD;  Location: [place];  Service: Open Heart Surgery;  Laterality: Right;  . TEE WITHOUT CARDIOVERSION N/A 02/22/2016   Procedure: TRANSESOPHAGEAL ECHOCARDIOGRAM (TEE);  Surgeon: Dorothy Spark, MD;  Location: [place];  Service: Cardiovascular;  Laterality: N/A;  . TEE WITHOUT CARDIOVERSION N/A 06/27/2016   Procedure: TRANSESOPHAGEAL ECHOCARDIOGRAM (TEE);  Surgeon: Rexene Alberts, MD;  Location: [place];  Service: Open Heart Surgery;  Laterality: N/A;  . TONSILLECTOMY    . VIDEO ASSISTED THORACOSCOPY (VATS)/WEDGE RESECTION Left 12/30/2014   Bronchoscopy, Mediastinoscopy, Left VATS for Wedge resection LUL x2 adn LLL x1 - Dr. Elenor Quinones at [facility]  . VOCAL CORD INJECTION Left 2017   injected with botox  . wrist surgery Left 2015   plate to wrist     Social History:  Social History   Socioeconomic History  . Marital status: Divorced    Spouse name: Not on file  . Number of children: Not on file  . Years of education: 69  . Highest education level: Not on file  Occupational  History  . Occupation: Retired  Tobacco Use  . Smoking status: Former Smoker    Packs/day: 0.25    Years: 24.00    Pack years: 6.00    Types: E-cigarettes, Cigarettes    Start date: 05/21/1983    Quit date: 08/26/2019    Years since quitting: 0.9  . Smokeless tobacco: Never Used  . Tobacco comment: Patient is interested in trying nicotene gum  Vaping Use  . Vaping Use: Former  . Quit date: 05/20/2006  Substance and Sexual Activity  . Alcohol use: Yes    Alcohol/week: 2.0 standard drinks    Types: 1 Shots of liquor, 1 Standard drinks or equivalent per week  . Drug use: No  . Sexual activity: Never    Partners: Male    Birth control/protection: Post-menopausal  Other Topics Concern  . Not on file  Social History Narrative  . Not on file   Social Determinants of Health   Financial Resource  Strain: Not on file  Food Insecurity: Not on file  Transportation Needs: Not on file  Physical Activity: Not on file  Stress: Not on file  Social Connections: Not on file  Intimate Partner Violence: Not on file    Family History:  Family History  Problem Relation Age of Onset  . Mitral valve prolapse Mother   . Dementia Father   . Prostate cancer Father   . Mitral valve prolapse Brother   . Mitral valve prolapse Sister   . Colon cancer Neg Hx   . Colon polyps Neg Hx   . Rectal cancer Neg Hx   . Esophageal cancer Neg Hx   . Stomach cancer Neg Hx   . Heart attack Neg Hx     Medications:   Current Outpatient Medications on File Prior to Visit  Medication Sig Dispense Refill  . acetaminophen (TYLENOL) 500 MG tablet Take 1,000 mg by mouth every 6 (six) hours as needed for mild pain or headache.    Marland Kitchen aspirin EC 81 MG EC tablet Take 1 tablet (81 mg total) by mouth daily for 21 days. Swallow whole. 30 tablet 11  . azelastine (ASTELIN) 0.1 % nasal spray Place 2 sprays into both nostrils as needed for rhinitis. Use in each nostril as directed    . clopidogrel (PLAVIX) 75 MG tablet TAKE 1 TABLET BY MOUTH EVERY DAY NEED OFFICE VISIT 90 tablet 2  . levothyroxine (SYNTHROID) 175 MCG tablet TAKE 1 TABLET BY MOUTH DAILY BEFORE BREAKFAST 90 tablet 1  . MELATONIN PO Take 1 tablet by mouth at bedtime as needed (sleep).    . methocarbamol (ROBAXIN) 500 MG tablet Take 1 tablet (500 mg total) by mouth every 6 (six) hours as needed for muscle spasms. 30 tablet 0  . metoprolol succinate (TOPROL XL) 25 MG 24 hr tablet Take 0.5 tablets (12.5 mg total) by mouth daily. 45 tablet 3  . Multiple Vitamins-Minerals (MULTIVITAMIN WITH MINERALS) tablet Take 1 tablet by mouth daily.    . nicotine (NICODERM CQ - DOSED IN MG/24 HR) 7 mg/24hr patch Place 1 patch (7 mg total) onto the skin daily. 28 patch 0  . nitroGLYCERIN (NITROSTAT) 0.4 MG SL tablet Place 1 tablet (0.4 mg total) under the tongue every 5 (five)  minutes as needed for chest pain. 25 tablet 3  . rosuvastatin (CRESTOR) 20 MG tablet Take 1 tablet (20 mg total) by mouth daily. 90 tablet 3  . traMADol (ULTRAM) 50 MG tablet Take 1 tablet (50 mg total) by mouth  every 6 (six) hours as needed. 20 tablet 0  . TRELEGY ELLIPTA 100-62.5-25 MCG/INH AEPB TAKE 1 PUFF BY MOUTH EVERY DAY 60 each 11  . Turmeric 500 MG CAPS Take 500 mg by mouth daily.    . vitamin B-12 (CYANOCOBALAMIN) 1000 MCG tablet Take 1 tablet (1,000 mcg total) by mouth daily.     No current facility-administered medications on file prior to visit.    Allergies:   Allergies  Allergen Reactions  . Penicillins Hives    Unknown, occurred as a child Has patient had a PCN reaction causing immediate rash, facial/tongue/throat swelling, SOB or lightheadedness with hypotension: Yes Has patient had a PCN reaction causing severe rash involving mucus membranes or skin necrosis: No Has patient had a PCN reaction that required hospitalization  Has patient had a PCN reaction occurring within the last 10 years: No If all of the above answers are "NO", then may proceed with Cephalosporin use. No      OBJECTIVE:  Physical Exam  Vitals:   08/23/20 1303  BP: 111/63  Pulse: 70  Weight: 142 lb (64.4 kg)  Height: 5\' 6"  (1.676 m)   Body mass index is 22.92 kg/m. No exam data present  Post stroke PHQ 2/9 Depression screen PHQ 2/9 08/17/2020  Decreased Interest 0  Down, Depressed, Hopeless 0  PHQ - 2 Score 0  Altered sleeping 0  Tired, decreased energy 0  Change in appetite 0  Feeling bad or failure about yourself  0  Trouble concentrating 0  Moving slowly or fidgety/restless 0  Suicidal thoughts 0  PHQ-9 Score 0  Difficult doing work/chores -  Some recent data might be hidden     General: well developed, well nourished,  pleasant elderly Caucasian female, seated, in no evident distress Head: head normocephalic and atraumatic.   Neck: supple with no carotid or  supraclavicular bruits Cardiovascular: regular rate and rhythm, no murmurs Musculoskeletal: no deformity Skin:  R neck surgical scar intact without evidence of infection Vascular:  Normal pulses all extremities   Neurologic Exam Mental Status: Awake and fully alert.   Fluent speech and language.  Oriented to place and time. Recent and remote memory intact. Attention span, concentration and fund of knowledge appropriate. Mood and affect appropriate.  Cranial Nerves: Fundoscopic exam reveals sharp disc margins. Pupils equal, briskly reactive to light. Extraocular movements full without nystagmus. Visual fields full to confrontation. Hearing intact. Facial sensation intact. Face, tongue, palate moves normally and symmetrically.  Motor: Normal bulk and tone. Normal strength in all tested extremity muscles Sensory.: intact to touch , pinprick , position and vibratory sensation.  Coordination: Rapid alternating movements normal in all extremities. Finger-to-nose and heel-to-shin performed accurately bilaterally. Gait and Station: Arises from chair without difficulty. Stance is normal. Gait demonstrates normal stride length and balance without use of assistive device. Reflexes: 1+ and symmetric. Toes downgoing.     NIHSS  0 Modified Rankin  0      ASSESSMENT: Joy Williams is a 70 y.o. year old female presented with dizziness, left arm incoordination and headache on 08/06/2020 possibly TIA with symptomatic R ICA stenosis s/p R CEA. Vascular risk factors include hx of multiple strokes, bilateral ICA stenosis, HTN, HLD, tobacco use, EtOH use, CAD, NSTEMI 2020, severe MR s/p MVR 2018 and CHF.     PLAN:  1. TIA, hx of prior strokes :  a. Recovered well without residual deficit.   b. Continue clopidogrel 75 mg daily  and Crestor for secondary stroke  prevention.   c. She also remains on aspirin but prolonged DAPT not further indicated from a stroke standpoint -advised her to speak further with  her cardiologist and [facility]VS if needed to continue from their standpoint d. Discussed secondary stroke prevention measures and importance of close PCP follow up for aggressive stroke risk factor management  e. HTN: BP goal <130/90.  Stable on current regimen per PCP f. HLD: LDL goal <70. Recent LDL 63 - recently changed to Crestor 20 mg daily per cardiology (reviewed cards OV note).  2. Carotid stenosis: L ICA 50% stenosis, R ICA 65% stenosis s/p R CEA 08/09/2020.  Postsurgical headaches - on Robaxin per VVS. Per VVS, repeat carotid duplex 9 months post procedure 3. Postprocedure headache: recommend starting topiramate 25 mg nightly - advised to call after 1 week if no benefit or sooner with any possible side effects. Continue Robaxin per [facility] as needed 4. Tobacco use: completely quit since discharge - has not used patch over this past week.  She was congratulated on this accomplishment!    Follow up in 3 months or call earlier if needed   CC:  [facility] provider: Dr. Leonie Man PCP: Lorrene Reid, PA-C    I spent 45 minutes of face-to-face and non-face-to-face time with patient and daughter.  This included previsit chart review including recent hospitalization pertinent progress notes, lab work and imaging, lab review, study review, order entry, electronic health record documentation, patient education regarding recent TIA, R CEA with residual right-sided headache, importance of managing stroke risk factors and answered all other questions to patient and daughters satisfaction  Frann Rider, AGNP-BC  [facility] [address] [place], [place] [address]  Phone [phone] Fax [phone] Note: This document was prepared with digital dictation and possible smart phrase technology. Any transcriptional errors that result from this process are unintentional.

## 2020-08-23 NOTE — Progress Notes (Signed)
I agree with the above plan 

## 2020-08-24 ENCOUNTER — Telehealth: Payer: Self-pay | Admitting: *Deleted

## 2020-08-24 ENCOUNTER — Encounter: Payer: Self-pay | Admitting: *Deleted

## 2020-08-24 ENCOUNTER — Telehealth: Payer: Self-pay

## 2020-08-24 DIAGNOSIS — I110 Hypertensive heart disease with heart failure: Secondary | ICD-10-CM | POA: Diagnosis not present

## 2020-08-24 DIAGNOSIS — I6521 Occlusion and stenosis of right carotid artery: Secondary | ICD-10-CM | POA: Diagnosis not present

## 2020-08-24 DIAGNOSIS — Z48812 Encounter for surgical aftercare following surgery on the circulatory system: Secondary | ICD-10-CM | POA: Diagnosis not present

## 2020-08-24 DIAGNOSIS — G459 Transient cerebral ischemic attack, unspecified: Secondary | ICD-10-CM | POA: Diagnosis not present

## 2020-08-24 DIAGNOSIS — J449 Chronic obstructive pulmonary disease, unspecified: Secondary | ICD-10-CM | POA: Diagnosis not present

## 2020-08-24 DIAGNOSIS — I5032 Chronic diastolic (congestive) heart failure: Secondary | ICD-10-CM | POA: Diagnosis not present

## 2020-08-24 NOTE — Telephone Encounter (Addendum)
Topriamate PA, key: [id number], O89.4, post procedure headache. Your information has been sent to [employer]. If [employer] has not replied to your request within 24 hours please contact [employer] at [phone]. For [facility] members, please call [phone].

## 2020-08-24 NOTE — Telephone Encounter (Signed)
Per CMM topiramate denied. Sent her my chart with [facility] prices.

## 2020-08-24 NOTE — Telephone Encounter (Signed)
Patient is s/p R CEA on 3/23 - [facility] sent in robaxin for right sided headaches. That medication is making her quite sleepy. Patient saw neurology yesterday and they would like to switch her to 25 mg of topiramate at night. [name] is aware of the switch and says it is fine - Kathlee Nations from [facility] will inform patient.

## 2020-09-01 DIAGNOSIS — I6521 Occlusion and stenosis of right carotid artery: Secondary | ICD-10-CM | POA: Diagnosis not present

## 2020-09-01 DIAGNOSIS — I5032 Chronic diastolic (congestive) heart failure: Secondary | ICD-10-CM | POA: Diagnosis not present

## 2020-09-01 DIAGNOSIS — G459 Transient cerebral ischemic attack, unspecified: Secondary | ICD-10-CM | POA: Diagnosis not present

## 2020-09-01 DIAGNOSIS — Z48812 Encounter for surgical aftercare following surgery on the circulatory system: Secondary | ICD-10-CM | POA: Diagnosis not present

## 2020-09-01 DIAGNOSIS — J449 Chronic obstructive pulmonary disease, unspecified: Secondary | ICD-10-CM | POA: Diagnosis not present

## 2020-09-01 DIAGNOSIS — I110 Hypertensive heart disease with heart failure: Secondary | ICD-10-CM | POA: Diagnosis not present

## 2020-09-03 ENCOUNTER — Other Ambulatory Visit: Payer: Self-pay | Admitting: Internal Medicine

## 2020-09-12 ENCOUNTER — Inpatient Hospital Stay: Payer: Medicare Other | Admitting: Adult Health

## 2020-09-12 ENCOUNTER — Other Ambulatory Visit: Payer: Self-pay

## 2020-09-12 ENCOUNTER — Ambulatory Visit (INDEPENDENT_AMBULATORY_CARE_PROVIDER_SITE_OTHER): Payer: Medicare Other | Admitting: Internal Medicine

## 2020-09-12 ENCOUNTER — Encounter: Payer: Self-pay | Admitting: Internal Medicine

## 2020-09-12 ENCOUNTER — Other Ambulatory Visit: Payer: Self-pay | Admitting: Physician Assistant

## 2020-09-12 VITALS — BP 100/60 | HR 80 | Temp 97.7°F | Ht 66.5 in | Wt 141.0 lb

## 2020-09-12 DIAGNOSIS — J449 Chronic obstructive pulmonary disease, unspecified: Secondary | ICD-10-CM

## 2020-09-12 DIAGNOSIS — I6523 Occlusion and stenosis of bilateral carotid arteries: Secondary | ICD-10-CM | POA: Diagnosis not present

## 2020-09-12 DIAGNOSIS — M349 Systemic sclerosis, unspecified: Secondary | ICD-10-CM | POA: Diagnosis not present

## 2020-09-12 DIAGNOSIS — Z780 Asymptomatic menopausal state: Secondary | ICD-10-CM

## 2020-09-12 NOTE — Patient Instructions (Signed)
Make sure you check your oxygen saturation at your highest level of activity to be sure it stays over 90% and keep track of it at least once a week, more often if breathing getting worse, and let me know if losing ground.    I will be referring you to our pulmonary fibrosis clinic here and see me as needed

## 2020-09-12 NOTE — Progress Notes (Signed)
Subjective:    Patient ID: Joy Williams, female    DOB: 01-06-1951    MRN: [id number]    Brief patient profile:  28  yowf  Quit smoking 02/2019   dx with CREST 2004 in [place] moved to [place] from [place] Nov 2015  self referred to pulmonary clinic 10/03/14  for copd though last set of pfts from 2014 did not show copd    History of Present Illness  10/03/2014 1st [facility] office visit/ Vida Nicol  On spiriva chronically  Chief Complaint  Patient presents with  . Pulmonary Consult    Self referral. Pt moved from [place] to [facility] in Nov 2015. She states that she was dxed with Crest Syndrome in her 92's or 30's.  She c/o "smoker's cough"- proc with clear sputum.  Her breathing is overall doing well.    cough is the main problem - can still do 5k racing power walk s stopping ie Not limited by breathing from desired activities  Takes fish oil for heart regurgitation and it "works great"  Cough is daily , def Worse in am's year round, present x > 5 y, worse since in [place]  rec Stop spiriva and start >>> Dulera 100 Take 2 puffs first thing in am and then another 2 puffs about 12 hours later.  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 Surgical resection with wedge resection and mediastinal lymph node sampling 12/2014 Records suggest  pneumonectomy done but clearly this is an error      06/07/2015  Extended f/u ov/Jospeh Mangel re: transition of care  sob s/p L lung resection maint rx dulera 100 2bid  Chief Complaint  Patient presents with  . Follow-up    Pt c/o increased DOE since had lung surgery for lung ca in Sept 2016.   doe x  MMRC2 = can't walk a nl pace on a flat grade s sob/ assoc with day > noct cough/ no assoc dysphagia/ has not had any RT  rec For cough > mucinex dm 1200 mg every 12 hours as needed Continue dulera 100 Take 2 puffs first thing in am and then another 2 puffs about 12 hours later.  Work on inhaler technique:  The key is to stop smoking completely before smoking completely stops you!  Try prilosec otc 24m  Take 30-60 min before first meal of the day and Pepcid ac (famotidine) 20 mg one @  bedtime until cough is completely gone for at least a week without the need for cough suppression GERD diet    09/17/2016  f/u ov/Britten Parady re: AB on dulera 100 2bid but insurance no longer covering  Chief Complaint  Patient presents with  . Follow-up    Pt states her breathing is doing well. She is requesting refill on Dulera. She states she has AM cough  if she forgets to take the PM dose of Dulera. She does not have an albuterol inhaler.   working toward a 5 k this month s/p  MVR 06/27/16 rec  Work on inhaler technique:  Continue symbicort 80 Take 2 puffs first thing in am and then another 2 puffs about 12 hours  lateer    05/25/2018  f/u ov/Khaleed Holan re: AB  Was on symb 80 2 bid doing ok  Chief Complaint  Patient presents with  . Follow-up    Insurance stopped covering symbicort and so she has been out of med x 4 days. She states her breathing has been "okay".  She does not have a rescue inhaler.   Dyspnea:  Not limited by breathing from desired activities  As long as on symb Cough: not a problem Sleeping: fine bed flat  SABA use: none 02: none  rec Advair 115  Take 2 puffs first thing in am and then another 2 puffs  about 12 hours later.  If you are not satisfied for any reason, please return your drug formulary to pick an alternative   07/09/2018  f/u ov/Tyra Michelle re:  Copd 0/ AB  Chief Complaint  Patient presents with  . Follow-up    discuss inhalers per 2/10 phone note.  advair is not working as well as the [employer].    Dyspnea:  More sob on advair vs symbicort Cough: some worse on advair/ dry hacking  Sleeping: up at least once cough SABA use: never uses  02: none  rec Stop advair and start trelegy one click each am x 2 good drags and use arm and hammer tooth paste and gargle and spit out If not happy with trelegy then call for PA on symbicort The key is to stop smoking completely before smoking completely stops you!     08/05/2019  f/u ov/Reida Hem re: AB /  Quit smoking oct  2020/ planning to  Begin  pulmonary rehab/ last covid 2 weeks prior to [place] Complaint  Patient presents with  . Follow-up    F/U after PFT. States her breathing has been stable since last visit. Will be starting pulmonary rehab soon.   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 Follow-up    Sob-better, had mini stroke last mth., pnd  Dyspnea:  MMRC1 = can walk nl pace, flat grade, can't hurry or go uphills or steps s sob   Cough: just with pollen Sleeping: no resp problems  SABA use: none  02: none  Covid status:   vax x 3 moderna    No obvious day to day or daytime variability or assoc excess/ purulent sputum or mucus plugs or hemoptysis or cp or chest tightness, subjective wheeze or overt sinus or hb symptoms.   Sleeping  without nocturnal  or early am  exacerbation  of respiratory  c/o's or need for noct saba. Also denies any obvious fluctuation of symptoms with weather or environmental changes or other aggravating or alleviating factors except as outlined above  No unusual exposure hx or h/o childhood pna/ asthma or knowledge of premature birth.  Current Allergies, Complete Past Medical History, Past Surgical History, Family History, and Social History were reviewed in [employer] record.  ROS  The following are not active complaints unless bolded Hoarseness, sore throat, dysphagia, dental problems, itching, sneezing,  nasal congestion or discharge of excess mucus or purulent secretions, ear ache,   fever, chills, sweats, unintended wt loss or wt gain, classically pleuritic or exertional cp,  orthopnea pnd or arm/hand swelling  or leg swelling, presyncope, palpitations, abdominal pain, anorexia, nausea, vomiting, diarrhea  or change in bowel habits or change in bladder habits, change in stools or change in urine, dysuria, hematuria,  rash, arthralgias, visual complaints, headache, numbness, weakness or ataxia or problems with walking or coordination,  change in mood or  memory.        Current Meds  Medication Sig  . acetaminophen (TYLENOL) 500 MG tablet Take 1,000 mg by mouth every 6 (six) hours as needed for mild pain or headache.  Marland Kitchen azelastine (ASTELIN) 0.1 % nasal spray Place 2 sprays into both nostrils as needed for rhinitis. Use in each nostril as directed  . clopidogrel (PLAVIX) 75 MG tablet TAKE 1 TABLET BY MOUTH EVERY DAY NEED OFFICE VISIT  . levothyroxine (SYNTHROID) 175 MCG tablet TAKE 1 TABLET BY MOUTH DAILY BEFORE BREAKFAST  . MELATONIN PO Take 1 tablet by mouth at bedtime as needed (sleep).  . metoprolol succinate (TOPROL XL) 25 MG 24 hr tablet Take 0.5 tablets (12.5 mg total) by mouth daily.  . Multiple Vitamins-Minerals (MULTIVITAMIN WITH MINERALS) tablet Take 1 tablet by mouth daily.  . nicotine (NICODERM  CQ - DOSED IN MG/24 HR) 7 mg/24hr patch Place 1 patch (7 mg total) onto the skin daily.  . nitroGLYCERIN (NITROSTAT) 0.4 MG SL tablet Place 1 tablet (0.4 mg total) under the tongue every 5 (five) minutes as needed for chest pain.  . rosuvastatin (CRESTOR) 20 MG tablet Take 1 tablet (20 mg total) by mouth daily.  . TRELEGY ELLIPTA 100-62.5-25 MCG/INH AEPB TAKE 1 PUFF BY MOUTH EVERY DAY  . Turmeric 500 MG CAPS Take 500 mg by mouth daily.  . vitamin B-12 (CYANOCOBALAMIN) 1000 MCG tablet Take 1 tablet (1,000 mcg total) by mouth daily.                         Objective:   Physical Exam    09/12/2020       141 08/05/2019       134  07/09/2018       133  05/25/2018         134  09/17/2016         129  06/07/2015       134   11/14/14 127 lb (57.607 kg)  10/28/14 125 lb (56.7 kg)  10/03/14 128 lb (58.06 kg)     Vital signs reviewed  09/12/2020  - Note at rest 02 sats  98% on RA   General appearance:   Elderly wf nad    .HEENT : pt wearing mask not removed for exam due to covid - 19 concerns.   NECK :  without JVD/Nodes/TM/ nl carotid upstrokes bilaterally   LUNGS: no acc muscle use,  Min barrel  contour chest wall with bilateral  slightly decreased bs insp crackles in bases  and  without cough on insp or exp maneuvers and min  Hyperresonant  to  percussion bilaterally     CV:  RRR  no s3 or murmur or increase in P2, and no edema   ABD:  soft and nontender with pos end  insp Hoover's  in the supine position. No bruits or organomegaly appreciated, bowel sounds nl  MS:   Nl gait/  ext warm without deformities, calf tenderness, cyanosis or clubbing No obvious joint restrictions   SKIN: warm and dry with classic telangectasias both palms  NEURO:  alert, approp, nl sensorium with  no motor or cerebellar deficits apparent.                            Assessment & Plan:

## 2020-09-13 ENCOUNTER — Encounter: Payer: Self-pay | Admitting: Internal Medicine

## 2020-09-13 ENCOUNTER — Other Ambulatory Visit: Payer: Self-pay | Admitting: Internal Medicine

## 2020-09-13 ENCOUNTER — Telehealth: Payer: Self-pay | Admitting: Internal Medicine

## 2020-09-13 MED ORDER — TRELEGY ELLIPTA 100-62.5-25 MCG/INH IN AEPB: INHALATION_SPRAY | RESPIRATORY_TRACT | 11 refills | Status: DC

## 2020-09-13 NOTE — Assessment & Plan Note (Signed)
Active smoker  -12/16/2012  s sign obst or even concavity to f/v loop  - 10/03/2014   try dulera 100 2bid  - PFTs 11/14/14 on dulera FEV1  2.33 (86%) ratio 78% and no change p saba and dlco 52% corrects to 65%  - Spirometry 06/07/2015  FEV1 1.74 (67%)  Ratio 76   PFT's   03/12/16   FEV1 2.09 (78 % ) ratio 0.80  p 0 % improvement from saba p ? prior to study with DLCO  48/53c % corrects to 84  % for alv volume  And no curvature  - 05/25/2018  After extensive coaching inhaler device,  effectiveness =    75% > changed to advair 115 hfa by insurance but preferred symb - 07/09/2018  After extensive coaching inhaler device,  effectiveness =    90% with elipta so rec trial of trelegy  -  PFT's  08/05/2019  FEV1 2.06 (80 % ) ratio 0.82  p 1 % improvement from saba p nothing prior to study with DLCO  13.77 (64%) corrects to 3.55 (87%)  for alv volume and FV curve min concavity    Group D in terms of symptom/risk and laba/lama/ICS  therefore appropriate rx at this point >>>  Continue trelegy/ congratulated on not smoking

## 2020-09-13 NOTE — Telephone Encounter (Signed)
Called and spoke to pt. Pt is requesting a new script sent to her pharmacy. Trelegy sent to preferred pharmacy. Pt verbalized understanding and denied any further questions or concerns at this time.

## 2020-09-13 NOTE — Assessment & Plan Note (Signed)
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  Total time for H and P, chart review, counseling, reviewing elipta device(s) and generating customized AVS unique to this office visit / same day charting = 23 min

## 2020-09-17 ENCOUNTER — Other Ambulatory Visit: Payer: Self-pay | Admitting: Cardiology

## 2020-09-20 ENCOUNTER — Telehealth (HOSPITAL_COMMUNITY): Payer: Self-pay

## 2020-09-20 NOTE — Telephone Encounter (Signed)
Called and spoke with pt in regards to PR, pt stated she is not interested at this time.   Closed referral 

## 2020-09-29 ENCOUNTER — Ambulatory Visit: Payer: Medicare Other | Admitting: Cardiology

## 2020-10-12 ENCOUNTER — Other Ambulatory Visit: Payer: Self-pay

## 2020-10-12 ENCOUNTER — Ambulatory Visit (INDEPENDENT_AMBULATORY_CARE_PROVIDER_SITE_OTHER): Payer: Medicare Other | Admitting: Internal Medicine

## 2020-10-12 ENCOUNTER — Encounter: Payer: Self-pay | Admitting: Internal Medicine

## 2020-10-12 VITALS — BP 116/64 | HR 77 | Ht 66.5 in | Wt 140.4 lb

## 2020-10-12 DIAGNOSIS — Z87891 Personal history of nicotine dependence: Secondary | ICD-10-CM | POA: Diagnosis not present

## 2020-10-12 DIAGNOSIS — R053 Chronic cough: Secondary | ICD-10-CM | POA: Diagnosis not present

## 2020-10-12 DIAGNOSIS — R942 Abnormal results of pulmonary function studies: Secondary | ICD-10-CM | POA: Diagnosis not present

## 2020-10-12 DIAGNOSIS — Z862 Personal history of diseases of the blood and blood-forming organs and certain disorders involving the immune mechanism: Secondary | ICD-10-CM | POA: Diagnosis not present

## 2020-10-12 DIAGNOSIS — J849 Interstitial pulmonary disease, unspecified: Secondary | ICD-10-CM | POA: Diagnosis not present

## 2020-10-12 DIAGNOSIS — I781 Nevus, non-neoplastic: Secondary | ICD-10-CM | POA: Diagnosis not present

## 2020-10-12 DIAGNOSIS — I6523 Occlusion and stenosis of bilateral carotid arteries: Secondary | ICD-10-CM | POA: Diagnosis not present

## 2020-10-12 DIAGNOSIS — Z8679 Personal history of other diseases of the circulatory system: Secondary | ICD-10-CM

## 2020-10-12 DIAGNOSIS — Z85118 Personal history of other malignant neoplasm of bronchus and lung: Secondary | ICD-10-CM | POA: Diagnosis not present

## 2020-10-12 NOTE — Patient Instructions (Addendum)
ICD-10-CM   1. Chronic cough  R05.3   2. History of lung cancer  Z85.118   3. ILD (interstitial lung disease) ([place])  J84.9   4. Abnormal PFT  R94.2   5. History of smoking 25-50 pack years  Z87.891   6. History of anemia  Z86.2   7. History of Raynaud's syndrome  Z86.79   8. 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  CT chest is without contrast  Do full PFT  Do CBC, BMET, LFT  Do additional blood work  -  ANA, double-stranded DNA, CCP, rheumatoid factor, PR-3, MPO, SCL-70, SSA, SSB, CK, aldolase, myositis panel, ESR, QuantiFERON gold  Refer to rheumatology [facility] Dr Estanislado Pandy, or Dr. Gavin Pound or Dr. Leigh Aurora -whoever can see you first]  Do interstitial lung disease questionnaire [take this home with you and then bring this back next visit]  Do simple walking desaturation test today   Followup  - Return to see Dr. Chase Caller in July 2022 but after completing all of the above 30-minute visit  -Return sooner if needed

## 2020-10-12 NOTE — Progress Notes (Signed)
Subjective:    Patient ID: Joy Williams, female    DOB: 1950-12-30    MRN: [id number]    Brief patient profile:  70  yowf  Quit smoking 02/2019   dx with CREST 2004 in [place] moved to [place] from [place] Nov 2015  self referred to pulmonary clinic 10/03/14  for copd though last set of pfts from 2014 did not show copd    History of Present Illness  10/03/2014 1st [facility] office visit/ W[facility]  On spiriva chronically  Chief Complaint  Patient presents with  . Pulmonary Consult    Self referral. Pt moved from [place] to [facility] in Nov 2015. 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  She c/o "smoker's cough"- proc with clear sputum.  Her breathing is overall doing well.    cough is the main problem - can still do 5k racing power walk s stopping ie Not limited by breathing from desired activities  Takes fish oil for heart regurgitation and it "works great"  Cough is daily , def Worse in am's year round, present x > 5 y, worse since in [place]  rec Stop spiriva and start >>> Dulera 100 Take 2 puffs first thing in am and then another 2 puffs about 12 hours later.  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 Surgical resection with wedge resection and mediastinal lymph node sampling 12/2014 Records  suggest pneumonectomy done but clearly this is an error      06/07/2015  Extended f/u ov/Wert re: transition of care  sob s/p L lung resection maint rx dulera 100 2bid  Chief Complaint  Patient presents with  . Follow-up    Pt c/o increased DOE since had lung surgery for lung ca in Sept 70   doe x  MMRC2 = can't walk a nl pace on a flat grade s sob/ assoc with day > noct cough/ no assoc dysphagia/ has not had any RT  rec For cough > mucinex dm 1200 mg every 12 hours as needed Continue dulera 100 Take 2 puffs first thing in am and then another 2 puffs about 12 hours later.  Work on inhaler technique:  The key is to stop smoking completely before smoking completely stops you!  Try prilosec otc 41m  Take 30-60 min before first meal of the day and Pepcid ac (famotidine) 20 mg one @  bedtime until cough is completely gone for at least a week without the need for cough suppression GERD diet    09/17/2016  f/u ov/Wert re: AB on dulera 100 2bid but insurance no longer covering  Chief Complaint  Patient presents with  . Follow-up    Pt states her breathing is doing well. She is requesting refill on Dulera. She states she has AM  cough if she forgets to take the PM dose of Dulera. She does not have an albuterol inhaler.   working toward a 5 k this month s/p  MVR 06/27/16 rec  Work on inhaler technique:  Continue symbicort 80 Take 2 puffs first thing in am and then another 2 puffs about 12 hours  lateer    05/25/2018  f/u ov/Wert re: AB  Was on symb 80 2 bid doing ok  Chief Complaint  Patient presents with  . Follow-up    Insurance stopped covering symbicort and so she has been out of med x 4 days. She states her breathing has been "okay".  She does not have a rescue inhaler.   Dyspnea:  Not limited by breathing from desired activities  As long as on symb Cough: not a problem Sleeping: fine bed flat  SABA use: none 02: none  rec Advair 115  Take 2 puffs first thing in am and then another  2 puffs about 12 hours later.  If you are not satisfied for any reason, please return your drug formulary to pick an alternative   07/09/2018  f/u ov/Wert re:  Copd 0/ AB  Chief Complaint  Patient presents with  . Follow-up    discuss inhalers per 2/10 phone note.  advair is not working as well as the [employer].    Dyspnea:  More sob on advair vs symbicort Cough: some worse on advair/ dry hacking  Sleeping: up at least once cough SABA use: never uses  02: none  rec Stop advair and start trelegy one click each am x 2 good drags and use arm and hammer tooth paste and gargle and spit out If not happy with trelegy then call for PA on symbicort The key is to stop smoking completely before smoking completely stops you!     08/05/2019  f/u ov/Wert re: AB /  Quit smoking oct  2020/ planning to  Begin  pulmonary rehab/ last covid 2 weeks prior to [place] Complaint  Patient presents with  . Follow-up    F/U after PFT. States her breathing has been stable since last visit. Will be starting pulmonary rehab soon.   Dyspnea:  trelegy has been the best rx yet now = MMRC1 = can walk nl pace, flat grade, can't hurry or go uphills or steps s sob   Cough: not now that quit smoking  Sleeping: better now s cough or sob  SABA use: none  02: none  rec No change rx/ f/u one year   09/12/2020  f/u ov/Wert re:  AB/ Scleroderma assoc UIP - did quit smoking / maint trelelgy  Chief Complaint  Patient presents with  . Follow-up    Sob-better, had mini stroke last mth., pnd  Dyspnea:  MMRC1 = can walk nl pace, flat grade, can't hurry or go uphills or steps s sob   Cough: just with pollen Sleeping: no resp problems  SABA use: none  02: none  Covid status:   vax x 3 moderna    No obvious day to day or daytime variability or assoc excess/ purulent sputum or mucus plugs or hemoptysis or cp or chest tightness, subjective wheeze or overt sinus or hb symptoms.   Sleeping  without nocturnal  or early am  exacerbation  of respiratory  c/o's or need for noct saba. Also denies any obvious fluctuation of symptoms with weather or environmental changes or other aggravating or alleviating factors except as outlined above  No unusual exposure hx or h/o childhood pna/ asthma or knowledge of premature birth.        OV 10/12/2020  Subjective:  Patient ID: Joy Williams, female , DOB: Aug 29, 1950 , age 29 y.o. , MRN: [id number] , ADDRESS: [address] [place] [address] PCP Lorrene Reid, PA-C Patient Care Team: Lorrene Reid, PA-C as PCP - General Freada Bergeron, MD as PCP - Cardiology (Cardiology) Dorothy Spark, MD (Inactive) as Consulting Physician (Cardiology) Tanda Rockers, MD as Consulting Physician (Pulmonary Disease) Hurley Cisco, MD as Consulting Physician (Rheumatology) Lerry Paterson, MD as Referring Physician Lissa Morales, MD as Referring Physician (Internal Medicine) [name], MD as Consulting Physician (Infectious Diseases)  This Provider for this visit: Treatment Team:  Attending Provider: Brand Males, MD    10/12/2020 -   Chief Complaint  Patient presents with  . Consult    Pt is being referred to Dr. Chase Caller by Dr. Melvyn Novas.  Pt states that she has an occ cough but denies any other complaints.   Transfer of care from Dr. Christinia Gully to Dr. Chase Caller in the ILD center.  History is gained from talking to the patient and review of the medical records including [place] and visualization of 2016 or 2017 CT scan of the chest.  HPI LAYLIANA DEVINS 70 y.o. -used to live in [place].  She worked in the [employer] for [employer].  In 2016 she retired and came to [place].  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  Her primary care physician thought she might have crest syndrome because she had Raynaud's along with allergic cases and she had  edema in her fingers.  After she moved here apparently she saw a rheumatologist [review of the chart indicates is Dr. Charlestine Night who has since retired] and was told that she might not have scleroderma.  She has been on observation therapy from a rheumatological standpoint.  In fact never seen a rheumatologist.  Then in 2016 she had early stage non-small cell lung cancer for which she underwent wedge resection left upper lobe.  She is followed up with [facility] since then.  She has had annual CT scans of the chest.  Most recent 1 was in May 2021.  She believes and per record review that she is in complete remission and I believe she might be discharged from follow-up there.  She tells me that at baseline she has very mild shortness of breath that is stable as documented below.  She also has amount of cough that is also documented below.  Is not clear that this is necessarily worse.  She is maintained on inhaled corticosteroid and long-acting beta agonist.  She is not sure if this is because of her diagnose of COPD other than the fact that she is on it for respiratory symptoms.  Most recently when she followed up with Dr. Gustavus Bryant review of the records revealed that there was interstitial lung disease findings on the CT scan of the chest in May 2021 at [facility].  In my review of the notes it appears that the pattern might have changed.  Infection was present even in 2016 2017 and currently the pattern might be changed.  She is definitely not feeling it.  Her symptoms are minimal and her walking desaturation test is normal.  Because of the ILD changes she has been referred to the ILD center  SYMPTOM SCALE - ILD 10/12/2020   O2 use ra  Shortness of Breath 0 -> 5 scale with 5 being worst (score 6 If unable to do)  At rest 0  Simple tasks - showers, clothes change, eating, shaving 0  Household (dishes, doing bed, laundry) 1  Shopping 2  Walking level at own pace 1  Walking up Stairs 2  Total (30-36)  Dyspnea Score 6  How bad is your cough? 2.5  How bad is your fatigue 2.5  How bad is nausea 0  How bad is vomiting?  0  How bad is diarrhea? 0  How bad is anxiety? 1  How bad is depression 0       Simple office walk 185 feet x  3 laps goal with forehead probe 10/12/2020   O2 used ra  Number laps completed 3  Comments about pace avg  Resting Pulse Ox/HR 100% and 72/min  Final Pulse Ox/HR 100% and 95/min  Desaturated </= 88% no  Desaturated <= 3% points no  Got Tachycardic >/= 90/min yes  Symptoms at end of test No complaints  Miscellaneous comments x      CT Chest data 09/23/19 at [place] (no image avail)  Interface, Rad Results In - 09/23/2019 11:22 AM EDT  Formatting of this note might be different from the original.  CT Chest   Indication: Non-small cell lung cancer, monitor, C34.92 Malignant neoplasm  of unspecified part of left bronchus or lung (CMS-HCC).   Comparison Exams: 06/10/2019   Protocol: Chest CT WO Protocol. Contiguous 0.625 mm axial images with 5  mm axial, 3 mm coronal, and 3 mm sagittal reconstructions were obtained  from the neck base through the upper abdomen without IV contrast material.  In addition, 3-D maximal intensity projection (MIP) reconstructions were  performed to potentially increase the sensitivity for the detection of  pulmonary nodules.   Findings:   The central airways are patent without endobronchial mass. The patient is  status post left upper lobe wedge resection which is stable in appearance.  Status post left lower lobe wedge resection with a similar appearance of  peripheral consolidation and groundglass lung with traction bronchiectasis  adjacent to the staple line. Redemonstrated findings of lower lobe  predominant pulmonary fibrosis with subpleural reticulation and probable  UIP pattern. Stable sub-6 mm pulmonary nodules and nodular opacities are  similar, index 5 mm pulmonary nodule in the anterior pleura of the right   upper lobe (2-993). New left lower lobe nodule on image 4-300 is favored to  represent endoluminal debris. No effusion or pneumothorax. High density  focus along the right diaphragm may represent pleural calcification.  Scattered calcified and noncalcified mediastinal and hilar lymph nodes,  measuring up to 1.3 cm in the left lower paratracheal region   Status post mitral valve are clear with mild cardiomegaly and coronary  artery calcifications in the LAD. No pericardial effusion.   There is a small hiatal hernia no aggressive osseous lesions.   The thyroid gland is unremarkable. No axillary adenopathy or chest wall  abnormality.   Impression:   Stable versus slightly increased in subpleural consolidation in the left  lower lobe adjacent to the surgical staple line. Given curvilinear  appearance on sagittal imaging, findings are favored to represent confluent  fibrosis over malignancy. Consider additional follow-up chest CT in 3-6  months.   Additional nodules/nodular opacities are similar. Attention on follow-up.   Electronically Reviewed by: Cheryl Flash, MD, [facility]  Electronically Reviewed on:  09/23/2019 11:14 AM    No results found.    PFT  PFT Results Latest Ref Rng & Units 08/05/2019 03/12/2016 11/14/2014  FVC-Pre L 2.52 2.67 2.97  FVC-Predicted Pre % 74 76 84  FVC-Post L 2.52 2.61 3.00  FVC-Predicted Post % 74 75 85  Pre FEV1/FVC % % 81 78 78  Post FEV1/FCV % % 82 80 78  FEV1-Pre L 2.04 2.09 2.33  FEV1-Predicted Pre % 79 78 86  FEV1-Post L 2.06 2.09 2.33  DLCO uncorrected ml/min/mmHg 13.77 13.52 14.61  DLCO UNC% % 64 48 52  DLCO corrected ml/min/mmHg 13.77 14.73 -  DLCO COR %Predicted % 64 53 -  DLVA Predicted % 87 84 65  TLC L 4.22 4.18 4.88  TLC % Predicted % 77 77 89  RV % Predicted % 74 71 88    Dec 30 2014 removed LUL lung tissue with 2 tumors/ and L vc paralyzed ever since - last chemo 05/06/15   Last [facility] onc note:  Date of  diagnosis: 12/2014 Diagnosis: NSCLC (Synchronous primaries) Histology: Adenocarcinoma Mutation status: EGFR pending, ALK, ROS1 negative Stage: Adenocarcinoma: T1aN0M0 Squamous T2aN0M0 Prior therapy: 1. 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   reports that she has been smoking e-cigarettes and cigarettes. She started smoking about 37 years ago. She has a 24.00 pack-year smoking history. She has never used smokeless tobacco.  Past Surgical History:  Procedure Laterality Date  . BACK SURGERY     x 3  Disectomy  . BREAST BIOPSY Left   . CARDIAC CATHETERIZATION N/A 03/08/2016   Procedure: Right/Left Heart Cath and Coronary Angiography;  Surgeon: Sherren Mocha, MD;  Location: [facility];  Service: Cardiovascular;  Laterality: N/A;  . CLAVICLE SURGERY Left 2013   plate to left collar bone  . ENDARTERECTOMY Right 08/09/2020   Procedure: RIGHT CAROTID ENDARTERECTOMY;  Surgeon: Waynetta Sandy, MD;  Location: [facility];  Service: Vascular;  Laterality: Right;  . LAPAROSCOPY     ? reason-age 5   . LEFT HEART CATH AND CORONARY ANGIOGRAPHY N/A 02/25/2019   Procedure: LEFT HEART CATH AND CORONARY ANGIOGRAPHY;  Surgeon: Jettie Booze, MD;  Location: [facility];  Service: Cardiovascular;  Laterality: N/A;  . LUNG CANCER SURGERY    . MITRAL VALVE REPAIR Right 06/27/2016   Procedure: MINIMALLY INVASIVE MITRAL VALVE REPLACEMENT;  Surgeon: Rexene Alberts, MD;  Location: [facility];  Service: Open Heart Surgery;  Laterality: Right;  . TEE WITHOUT CARDIOVERSION N/A 02/22/2016   Procedure: TRANSESOPHAGEAL ECHOCARDIOGRAM (TEE);  Surgeon: Dorothy Spark, MD;  Location: [facility];  Service: Cardiovascular;  Laterality: N/A;  . TEE WITHOUT CARDIOVERSION N/A 06/27/2016   Procedure: TRANSESOPHAGEAL ECHOCARDIOGRAM (TEE);  Surgeon: Rexene Alberts, MD;  Location: [facility];  Service: Open Heart Surgery;  Laterality: N/A;  . TONSILLECTOMY    . VIDEO ASSISTED THORACOSCOPY (VATS)/WEDGE RESECTION Left 12/30/2014   Bronchoscopy, Mediastinoscopy, Left VATS for Wedge resection LUL x2  adn LLL x1 - [facility] El[facility]ones [facility]  . VOCAL CORD INJECTION Left 2017   injected with botox  . wrist surgery Left 2015   plate to wrist     Allergies  Allergen Reactions  . Penicillins Hives    Unknown, occurred as a child Has patient had a PCN reaction causing immediate rash, facial/tongue/throat swelling, SOB or lightheadedness with hypotension: Yes Has patient had a PCN reaction causing severe rash involving mucus membranes or skin necrosis: No Has patient had a PCN reaction that required hospitalization  Has patient had a PCN reaction occurring within the last 10 years: No If all of the above answers are "NO", then may proceed with Cephalosporin use. No    Immunization History  Administered Date(s) Administered  . Fluad Quad(high Dose 65+) 02/18/2019  . H1N1 04/13/2008  . Influenza Split 02/18/2015  . Influenza, High Dose Seasonal PF 02/20/2017, 04/27/2018  . Influenza,inj,Quad PF,6+ Mos 03/16/2015  . Influenza-Unspecified 04/12/2008, 02/18/2011  . Moderna Sars-Covid-2 Vaccination 06/21/2019, 07/18/2019, 01/25/2020  . Pneumococcal Conjugate-13 01/22/2017  . Tdap  09/26/2015  . Zoster Recombinat (Shingrix) 05/10/2018, 03/03/2019    Family History  Problem Relation Age of Onset  . Mitral valve prolapse Mother   . Dementia Father   . Prostate cancer Father   . Mitral valve prolapse Brother   . Mitral valve prolapse Sister   . Colon cancer Neg Hx   . Colon polyps Neg Hx   . Rectal cancer Neg Hx   . Esophageal cancer Neg Hx   . Stomach cancer Neg Hx   . Heart attack Neg Hx      Current Outpatient Medications:  .  acetaminophen (TYLENOL) 500 MG tablet, Take 1,000 mg by mouth every 6 (six) hours as needed for mild pain or headache., Disp: , Rfl:  .  azelastine (ASTELIN) 0.1 % nasal spray, Place 2 sprays into both nostrils as needed for rhinitis. Use in each nostril as directed, Disp: , Rfl:  .  clopidogrel (PLAVIX) 75 MG tablet, TAKE 1 TABLET BY MOUTH EVERY DAY NEED OFFICE VISIT, Disp: 90 tablet, Rfl: 2 .  Fluticasone-Umeclidin-Vilant (TRELEGY ELLIPTA) 100-62.5-25 MCG/INH AEPB, TAKE 1 PUFF BY MOUTH EVERY DAY, Disp: 60 each, Rfl: 11 .  levothyroxine (SYNTHROID) 175 MCG tablet, TAKE 1 TABLET BY MOUTH DAILY BEFORE BREAKFAST, Disp: 90 tablet, Rfl: 1 .  MELATONIN PO, Take 1 tablet by mouth at bedtime as needed (sleep)., Disp: , Rfl:  .  metoprolol succinate (TOPROL XL) 25 MG 24 hr tablet, Take 0.5 tablets (12.5 mg total) by mouth daily., Disp: 45 tablet, Rfl: 3 .  Multiple Vitamins-Minerals (MULTIVITAMIN WITH MINERALS) tablet, Take 1 tablet by mouth daily., Disp: , Rfl:  .  rosuvastatin (CRESTOR) 20 MG tablet, Take 1 tablet (20 mg total) by mouth daily., Disp: 90 tablet, Rfl: 3 .  Turmeric 500 MG CAPS, Take 500 mg by mouth daily., Disp: , Rfl:  .  vitamin B-12 (CYANOCOBALAMIN) 1000 MCG tablet, Take 1 tablet (1,000 mcg total) by mouth daily., Disp: , Rfl:  .  nicotine (NICODERM CQ - DOSED IN MG/24 HR) 7 mg/24hr patch, Place 1 patch (7 mg total) onto the skin daily. (Patient not taking: Reported on 10/12/2020), Disp: 28 patch, Rfl: 0 .  nitroGLYCERIN  (NITROSTAT) 0.4 MG SL tablet, Place 1 tablet (0.4 mg total) under the tongue every 5 (five) minutes as needed for chest pain. (Patient not taking: Reported on 10/12/2020), Disp: 25 tablet, Rfl: 3      Objective:  Vitals:   10/12/20 1528  BP: 116/64  Pulse: 77  SpO2: 97%  Weight: 140 lb 6.4 oz (63.7 kg)  Height: 5' 6.5" (1.689 m)    Estimated body mass index is 22.32 kg/m as calculated from the following:   Height as of this encounter: 5' 6.5" (1.689 m).   Weight as of this encounter: 140 lb 6.4 oz (63.7 kg).  _0 @  [employer]   10/12/20 1528  Weight: 140 lb 6.4 oz (63.7 kg)     Physical Exam  General Appearance:    Alert, cooperative, no distress, appears stated age - yes , Deconditioned looking - no , OBESE  - no, Sitting on Wheelchair -  no  Head:    Normocephalic, without obvious abnormality, atraumatic  Eyes:    PERRL, conjunctiva/corneas clear,  Ears:    Normal TM's and external ear canals, both ears  Nose:   Nares normal, septum midline, mucosa normal, no drainage    or sinus tenderness. OXYGEN ON  - no . Patient is @ ra   Throat:   Lips, mucosa, and tongue normal; teeth and gums normal. Cyanosis on lips - no  Neck:   Supple, symmetrical, trachea midline, no adenopathy;    thyroid:  no enlargement/tenderness/nodules; no carotid   bruit or JVD  Back:     Symmetric, no curvature, ROM normal, no CVA tenderness  Lungs:     Distress - no , Wheeze no, Barrell Chest - no, Purse lip breathing - no, Crackles - YES at lung base,   Chest Wall:    No tenderness or deformity.    Heart:    Regular rate and rhythm, S1 and S2 normal, no rub   or gallop, Murmur - no  Breast Exam:    NOT DONE  Abdomen:     Soft, non-tender, bowel sounds active all four quadrants,    no masses, no organomegaly. Visceral obesity - no  Genitalia:   NOT DONE  Rectal:   NOT DONE  Extremities:   Extremities - normal, Has Cane - no, Clubbing - no, Edema - no  Pulses:   2+ and symmetric all  extremities  Skin:   Stigmata of Connective Tissue Disease - STIGMATA of CONNECTIVE TISSUE DISEASE  - Distal digital fissuring (ie, "mechanic hands") - no - Distal digital tip ulceration - no -Inflammatory arthritis or polyarticular morning joint stiffness ?60 minutes - no - Palmar telangiectasia - YES - Raynaud phenomenon - YES - Unexplained digital edema - YES - Unexplained fixed rash on the digital extensor surfaces (Gottron's sign) - NO .Marland Kitchen. - Deformities of RA - no - Scleroderma  - no - Malar Rash -  IN FORM OF telangiectasia   Lymph nodes:   Cervical, supraclavicular, and axillary nodes normal  Psychiatric:  Neurologic:   Pleasant - yes, Anxious - no, Flat affect - no  CAm-ICU - neg, Alert and Oriented x 3 - yes, Moves all 4s - yes, Speech - normal, Cognition - intact  Any      Assessment:       ICD-10-CM   1. Chronic cough  R05.3   2. History of lung cancer  Z85.118   3. ILD (interstitial lung disease) ([place])  J84.9   4. Abnormal PFT  R94.2   5. History of smoking 25-50 pack years  Z87.891   6. History of anemia  Z86.2   7. History of Raynaud's syndrome  Z86.79   8. 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  In fact I am worried that it might be progressive over time.  She has decline in PFTs over time but some of this could be because of the wedge resection.  Her last PFT was a few years ago.  Last CT scan in our system was few years ago.  Getting a high-resolution CT chest and pulmonary function test can show progression versus stability.  The other part in the interstitial lung disease is to differentiate as the etiology.  If she indeed has autoimmune disease, then we will have to treat with immunomodulators and antifibrotic's.  She is also a candidate for IPF given her age smoking history.  Lab review indicates anemia.  We will check hemoglobin.  With challenge ectasias do not know if she has any other telangiectasias.  We will get her to  rheumatology.    Plan:     Patient Instructions     ICD-10-CM   1. Chronic cough  R05.3   2. History of lung cancer  Z85.118   3. ILD (interstitial lung disease) ([place])  J84.9   4. Abnormal PFT  R94.2   5. History of smoking 25-50 pack years  Z87.891   6. History of anemia  Z86.2   7. History of Raynaud's syndrome  Z86.79   8. 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  CT chest is without contrast  Do full PFT  Do CBC, BMET, LFT  Do additional blood work  -  ANA, double-stranded DNA, CCP, rheumatoid factor, PR-3, MPO, SCL-70, SSA, SSB, CK, aldolase, myositis panel, ESR, QuantiFERON gold  Refer to rheumatology [facility] Dr Estanislado Pandy, or Dr. Gavin Pound or Dr. Leigh Aurora -whoever can see you first]  Do interstitial lung disease questionnaire [take this home with you and then bring this back next visit]  Do simple walking desaturation test today   Followup  - Return to see Dr. Chase Caller in July 2022 but after completing all of the above 30-minute visit  -Return sooner if needed   ( Level 05 visit: Estb 40-54 min   in  visit type: on-site physical face to visit  in total care time and counseling or/and coordination of care by this undersigned MD - Dr Brand Males. This includes one or more of the following on this same day 10/12/2020: pre-charting, chart review, note writing, documentation discussion of test results, diagnostic or treatment recommendations, prognosis, risks and benefits of management options, instructions, education, compliance or risk-factor reduction. It excludes time spent by the  [facility] or office staff in the care of the patient. Actual time 25 min)    SIGNATURE    Dr. Brand Males, M.D., F.C.C.P,  Pulmonary and Critical Care Medicine Staff Physician, [place] Director - Interstitial Lung Disease  Program  [facility] [place], [place], [address]  Pager: [phone], If no answer or between  15:00h - 7:00h: call [phone] Telephone: [phone]  4:13 PM 10/12/2020

## 2020-10-13 LAB — HEPATIC FUNCTION PANEL
ALT: 21 U/L (ref 0–35)
AST: 18 U/L (ref 0–37)
Albumin: 4.5 g/dL (ref 3.5–5.2)
Alkaline Phosphatase: 62 U/L (ref 39–117)
Bilirubin, Direct: 0.1 mg/dL (ref 0.0–0.3)
Total Bilirubin: 0.3 mg/dL (ref 0.2–1.2)
Total Protein: 7.2 g/dL (ref 6.0–8.3)

## 2020-10-13 LAB — CBC WITH DIFFERENTIAL/PLATELET
Basophils Absolute: 0.1 10*3/uL (ref 0.0–0.1)
Basophils Relative: 1.1 % (ref 0.0–3.0)
Eosinophils Absolute: 0.1 10*3/uL (ref 0.0–0.7)
Eosinophils Relative: 1.1 % (ref 0.0–5.0)
HCT: 35.8 % — ABNORMAL LOW (ref 36.0–46.0)
Hemoglobin: 12 g/dL (ref 12.0–15.0)
Lymphocytes Relative: 17.1 % (ref 12.0–46.0)
Lymphs Abs: 1.4 10*3/uL (ref 0.7–4.0)
MCHC: 33.4 g/dL (ref 30.0–36.0)
MCV: 83 fl (ref 78.0–100.0)
Monocytes Absolute: 0.4 10*3/uL (ref 0.1–1.0)
Monocytes Relative: 5.4 % (ref 3.0–12.0)
Neutro Abs: 6 10*3/uL (ref 1.4–7.7)
Neutrophils Relative %: 75.3 % (ref 43.0–77.0)
Platelets: 236 10*3/uL (ref 150.0–400.0)
RBC: 4.32 Mil/uL (ref 3.87–5.11)
RDW: 12.9 % (ref 11.5–15.5)
WBC: 8 10*3/uL (ref 4.0–10.5)

## 2020-10-13 LAB — BASIC METABOLIC PANEL
BUN: 15 mg/dL (ref 6–23)
CO2: 26 mEq/L (ref 19–32)
Calcium: 9.6 mg/dL (ref 8.4–10.5)
Chloride: 97 mEq/L (ref 96–112)
Creatinine, Ser: 0.73 mg/dL (ref 0.40–1.20)
GFR: 83.76 mL/min (ref >60.00)
Glucose, Bld: 84 mg/dL (ref 70–99)
Potassium: 4 mEq/L (ref 3.5–5.1)
Sodium: 134 mEq/L — ABNORMAL LOW (ref 135–145)

## 2020-10-13 LAB — SEDIMENTATION RATE: Sed Rate: 32 mm/hr — ABNORMAL HIGH (ref 0–30)

## 2020-10-17 LAB — ANTI-NUCLEAR AB-TITER (ANA TITER)
ANA TITER: 1:320 {titer} — ABNORMAL HIGH
ANA Titer 1: 1:1280 {titer} — ABNORMAL HIGH

## 2020-10-17 LAB — SJOGREN'S SYNDROME ANTIBODS(SSA + SSB)
SSA (Ro) (ENA) Antibody, IgG: <1 AI
SSB (La) (ENA) Antibody, IgG: <1 AI

## 2020-10-17 LAB — MPO/PR-3 (ANCA) ANTIBODIES
Myeloperoxidase Abs: <1 AI
Serine Protease 3: <1 AI

## 2020-10-17 LAB — QUANTIFERON-TB GOLD PLUS
Mitogen-NIL: 9.94 IU/mL
NIL: 0.02 IU/mL
QuantiFERON-TB Gold Plus: NEGATIVE
TB1-NIL: 0.01 IU/mL
TB2-NIL: 0 IU/mL

## 2020-10-17 LAB — CK TOTAL AND CKMB (NOT AT ARMC)
CK, MB: 2.4 ng/mL (ref 0–5.0)
Relative Index: 1.9 (ref 0–4.0)
Total CK: 129 U/L (ref 29–143)

## 2020-10-17 LAB — RHEUMATOID FACTOR: Rheumatoid fact SerPl-aCnc: <14 IU/mL (ref <14)

## 2020-10-17 LAB — CYCLIC CITRUL PEPTIDE ANTIBODY, IGG: Cyclic Citrullin Peptide Ab: <16 UNITS

## 2020-10-17 LAB — ALDOLASE: Aldolase: 5.9 U/L (ref <=8.1)

## 2020-10-17 LAB — ANTI-DNA ANTIBODY, DOUBLE-STRANDED: ds DNA Ab: <1 IU/mL

## 2020-10-17 LAB — ANA: Anti Nuclear Antibody (ANA): POSITIVE — AB

## 2020-10-17 LAB — ANTI-SCLERODERMA ANTIBODY: Scleroderma (Scl-70) (ENA) Antibody, IgG: <1 AI

## 2020-10-18 ENCOUNTER — Ambulatory Visit (HOSPITAL_COMMUNITY)
Admission: RE | Admit: 2020-10-18 | Discharge: 2020-10-18 | Disposition: A | Discharge status: Home / Self Care | Payer: Medicare Other | Source: Ambulatory Visit | Attending: Internal Medicine | Admitting: Internal Medicine

## 2020-10-18 ENCOUNTER — Other Ambulatory Visit: Payer: Self-pay

## 2020-10-18 DIAGNOSIS — R053 Chronic cough: Secondary | ICD-10-CM

## 2020-10-18 DIAGNOSIS — J849 Interstitial pulmonary disease, unspecified: Secondary | ICD-10-CM | POA: Diagnosis not present

## 2020-10-18 DIAGNOSIS — Z85118 Personal history of other malignant neoplasm of bronchus and lung: Secondary | ICD-10-CM | POA: Diagnosis not present

## 2020-10-18 DIAGNOSIS — J8410 Pulmonary fibrosis, unspecified: Secondary | ICD-10-CM | POA: Diagnosis not present

## 2020-10-18 DIAGNOSIS — I2510 Atherosclerotic heart disease of native coronary artery without angina pectoris: Secondary | ICD-10-CM | POA: Diagnosis not present

## 2020-10-18 DIAGNOSIS — J479 Bronchiectasis, uncomplicated: Secondary | ICD-10-CM | POA: Diagnosis not present

## 2020-10-19 ENCOUNTER — Encounter (HOSPITAL_BASED_OUTPATIENT_CLINIC_OR_DEPARTMENT_OTHER): Payer: Self-pay

## 2020-10-19 NOTE — Telephone Encounter (Signed)
Dr. Chase Caller, we received the following mychart message.  Dr. Chase Caller  When can we discuss the CT Scan results?  The scan was done yesterday and I have informed the patient we normally allow for 4 days before results are called to patient. Please advise. Thanks!

## 2020-10-20 NOTE — Telephone Encounter (Signed)
  All we can tell as now sit hat Joy Williams has pumonary fibrosis and is worse than 2016. WE alreadyu had inkling of that at last visit. No evidence of cancer.   Plan - complete all things outlined in recent visit  - ensure visit in July 2022 (ok for rheum appt to be after my visit) but if Joy Williams wants sooner Joy Williams can see an app and I can outline what to do   identified.  IMPRESSION: 1. There is mild pulmonary fibrosis in a pattern with apical to basal gradient, featuring irregular peripheral interstitial opacity, septal thickening, traction bronchiectasis, and subpleural bronchiolectasis at the lung basis without clear evidence of honeycombing. Fibrotic findings are worsened over time on prior examinations dating back to 11/16/2014. Findings are categorized as probable UIP per consensus guidelines: Diagnosis of Idiopathic Pulmonary Fibrosis: An Official ATS/ERS/JRS/ALAT Clinical Practice Guideline. [place], Iss 5, [phone], Jan 18 2017. 2. Redemonstrated postoperative findings of anterior left upper lobe and dependent left lower lobe wedge resections. No evidence of malignant recurrence in the chest. 3. Coronary artery disease.  Aortic Atherosclerosis (ICD10-I70.0).   Electronically Signed   By: Eddie Candle M.D.   OnResults for TOMEIKA, WEINMANN (MRN [id number]) as of 10/20/2020 16:40  Ref. Range 10/12/2020 16:37  Anti Nuclear Antibody (ANA) Latest Ref Range: NEGATIVE  POSITIVE (A)  ANA Pattern 1 Unknown Nuclear, Nucleolar (A)  ANA Titer 1 Latest Units: titer 0:7,867 (H)  Cyclic Citrullin Peptide Ab Latest Units: UNITS <16  ds DNA Ab Latest Units: IU/mL <1  Myeloperoxidase Abs Latest Units: AI <1.0  Serine Protease 3 Latest Units: AI <1.0  RA Latex Turbid. Latest Ref Range: <14 IU/mL <14  SSA (Ro) (ENA) Antibody, IgG Latest Ref Range: <1.0 NEG AI <1.0 NEG  SSB (La) (ENA) Antibody, IgG Latest Ref Range: <1.0 NEG AI <1.0 NEG  Scleroderma (Scl-70) (ENA)  Antibody, IgG Latest Ref Range: <1.0 NEG AI <1.0 NEG  : 10/19/2020 12:

## 2020-10-24 ENCOUNTER — Encounter (HOSPITAL_COMMUNITY)
Admission: EM | Disposition: A | Discharge status: Home / Self Care | Payer: Self-pay | Source: Home / Self Care | Attending: Interventional Cardiology

## 2020-10-24 ENCOUNTER — Encounter (HOSPITAL_COMMUNITY): Payer: Self-pay | Admitting: Physician Assistant

## 2020-10-24 ENCOUNTER — Ambulatory Visit (HOSPITAL_COMMUNITY): Admit: 2020-10-24 | Payer: Medicare Other | Admitting: Interventional Cardiology

## 2020-10-24 ENCOUNTER — Inpatient Hospital Stay (HOSPITAL_COMMUNITY)
Admission: EM | Admit: 2020-10-24 | Discharge: 2020-10-29 | DRG: 286 | Disposition: A | Discharge status: Home / Self Care | Payer: Medicare Other | Attending: Interventional Cardiology | Admitting: Interventional Cardiology

## 2020-10-24 ENCOUNTER — Inpatient Hospital Stay (HOSPITAL_COMMUNITY): Payer: Medicare Other

## 2020-10-24 DIAGNOSIS — Z79899 Other long term (current) drug therapy: Secondary | ICD-10-CM | POA: Diagnosis not present

## 2020-10-24 DIAGNOSIS — Z20822 Contact with and (suspected) exposure to covid-19: Secondary | ICD-10-CM | POA: Diagnosis present

## 2020-10-24 DIAGNOSIS — I25118 Atherosclerotic heart disease of native coronary artery with other forms of angina pectoris: Secondary | ICD-10-CM | POA: Diagnosis present

## 2020-10-24 DIAGNOSIS — Z8249 Family history of ischemic heart disease and other diseases of the circulatory system: Secondary | ICD-10-CM | POA: Diagnosis not present

## 2020-10-24 DIAGNOSIS — R072 Precordial pain: Secondary | ICD-10-CM | POA: Diagnosis present

## 2020-10-24 DIAGNOSIS — C3490 Malignant neoplasm of unspecified part of unspecified bronchus or lung: Secondary | ICD-10-CM | POA: Diagnosis present

## 2020-10-24 DIAGNOSIS — I5043 Acute on chronic combined systolic (congestive) and diastolic (congestive) heart failure: Secondary | ICD-10-CM | POA: Diagnosis not present

## 2020-10-24 DIAGNOSIS — I341 Nonrheumatic mitral (valve) prolapse: Secondary | ICD-10-CM | POA: Diagnosis present

## 2020-10-24 DIAGNOSIS — J8410 Pulmonary fibrosis, unspecified: Secondary | ICD-10-CM | POA: Diagnosis not present

## 2020-10-24 DIAGNOSIS — J84112 Idiopathic pulmonary fibrosis: Secondary | ICD-10-CM | POA: Diagnosis present

## 2020-10-24 DIAGNOSIS — I25119 Atherosclerotic heart disease of native coronary artery with unspecified angina pectoris: Secondary | ICD-10-CM

## 2020-10-24 DIAGNOSIS — I959 Hypotension, unspecified: Secondary | ICD-10-CM | POA: Diagnosis present

## 2020-10-24 DIAGNOSIS — E876 Hypokalemia: Secondary | ICD-10-CM | POA: Diagnosis not present

## 2020-10-24 DIAGNOSIS — Z8042 Family history of malignant neoplasm of prostate: Secondary | ICD-10-CM | POA: Diagnosis not present

## 2020-10-24 DIAGNOSIS — J449 Chronic obstructive pulmonary disease, unspecified: Secondary | ICD-10-CM | POA: Diagnosis not present

## 2020-10-24 DIAGNOSIS — I5181 Takotsubo syndrome: Secondary | ICD-10-CM | POA: Diagnosis not present

## 2020-10-24 DIAGNOSIS — E039 Hypothyroidism, unspecified: Secondary | ICD-10-CM | POA: Diagnosis not present

## 2020-10-24 DIAGNOSIS — M349 Systemic sclerosis, unspecified: Secondary | ICD-10-CM | POA: Diagnosis present

## 2020-10-24 DIAGNOSIS — I252 Old myocardial infarction: Secondary | ICD-10-CM | POA: Diagnosis present

## 2020-10-24 DIAGNOSIS — I499 Cardiac arrhythmia, unspecified: Secondary | ICD-10-CM | POA: Diagnosis not present

## 2020-10-24 DIAGNOSIS — Z953 Presence of xenogenic heart valve: Secondary | ICD-10-CM | POA: Diagnosis not present

## 2020-10-24 DIAGNOSIS — I5021 Acute systolic (congestive) heart failure: Secondary | ICD-10-CM | POA: Diagnosis present

## 2020-10-24 DIAGNOSIS — C3492 Malignant neoplasm of unspecified part of left bronchus or lung: Secondary | ICD-10-CM | POA: Diagnosis present

## 2020-10-24 DIAGNOSIS — F17210 Nicotine dependence, cigarettes, uncomplicated: Secondary | ICD-10-CM | POA: Diagnosis present

## 2020-10-24 DIAGNOSIS — I2510 Atherosclerotic heart disease of native coronary artery without angina pectoris: Secondary | ICD-10-CM | POA: Diagnosis present

## 2020-10-24 DIAGNOSIS — Z79890 Hormone replacement therapy (postmenopausal): Secondary | ICD-10-CM

## 2020-10-24 DIAGNOSIS — I7300 Raynaud's syndrome without gangrene: Secondary | ICD-10-CM | POA: Diagnosis present

## 2020-10-24 DIAGNOSIS — E785 Hyperlipidemia, unspecified: Secondary | ICD-10-CM | POA: Diagnosis not present

## 2020-10-24 DIAGNOSIS — Z7902 Long term (current) use of antithrombotics/antiplatelets: Secondary | ICD-10-CM

## 2020-10-24 DIAGNOSIS — E611 Iron deficiency: Secondary | ICD-10-CM | POA: Diagnosis present

## 2020-10-24 DIAGNOSIS — R079 Chest pain, unspecified: Secondary | ICD-10-CM | POA: Diagnosis not present

## 2020-10-24 DIAGNOSIS — I5032 Chronic diastolic (congestive) heart failure: Secondary | ICD-10-CM | POA: Diagnosis present

## 2020-10-24 DIAGNOSIS — D5912 Cold autoimmune hemolytic anemia: Secondary | ICD-10-CM | POA: Diagnosis present

## 2020-10-24 DIAGNOSIS — I213 ST elevation (STEMI) myocardial infarction of unspecified site: Secondary | ICD-10-CM | POA: Diagnosis not present

## 2020-10-24 DIAGNOSIS — Z880 Allergy status to penicillin: Secondary | ICD-10-CM | POA: Diagnosis not present

## 2020-10-24 DIAGNOSIS — R112 Nausea with vomiting, unspecified: Secondary | ICD-10-CM | POA: Diagnosis present

## 2020-10-24 DIAGNOSIS — I10 Essential (primary) hypertension: Secondary | ICD-10-CM | POA: Diagnosis not present

## 2020-10-24 DIAGNOSIS — R531 Weakness: Secondary | ICD-10-CM | POA: Diagnosis present

## 2020-10-24 DIAGNOSIS — R0789 Other chest pain: Secondary | ICD-10-CM | POA: Diagnosis not present

## 2020-10-24 HISTORY — DX: ST elevation (STEMI) myocardial infarction of unspecified site: I21.3

## 2020-10-24 HISTORY — PX: LEFT HEART CATH AND CORONARY ANGIOGRAPHY: CATH118249

## 2020-10-24 LAB — CBC
HCT: 37.3 % (ref 36.0–46.0)
Hemoglobin: 12.1 g/dL (ref 12.0–15.0)
MCH: 27.1 pg (ref 26.0–34.0)
MCHC: 32.4 g/dL (ref 30.0–36.0)
MCV: 83.6 fL (ref 80.0–100.0)
Platelets: 259 10*3/uL (ref 150–400)
RBC: 4.46 MIL/uL (ref 3.87–5.11)
RDW: 12.4 % (ref 11.5–15.5)
WBC: 10.8 10*3/uL — ABNORMAL HIGH (ref 4.0–10.5)
nRBC: 0 % (ref 0.0–0.2)

## 2020-10-24 LAB — COMPREHENSIVE METABOLIC PANEL
ALT: 18 U/L (ref 0–44)
AST: 33 U/L (ref 15–41)
Albumin: 4.1 g/dL (ref 3.5–5.0)
Alkaline Phosphatase: 57 U/L (ref 38–126)
Anion gap: 10 (ref 5–15)
BUN: 11 mg/dL (ref 8–23)
CO2: 23 mmol/L (ref 22–32)
Calcium: 9.2 mg/dL (ref 8.9–10.3)
Chloride: 99 mmol/L (ref 98–111)
Creatinine, Ser: 0.68 mg/dL (ref 0.44–1.00)
GFR, Estimated: >60 mL/min (ref >60)
Glucose, Bld: 164 mg/dL — ABNORMAL HIGH (ref 70–99)
Potassium: 3.3 mmol/L — ABNORMAL LOW (ref 3.5–5.1)
Sodium: 132 mmol/L — ABNORMAL LOW (ref 135–145)
Total Bilirubin: 0.5 mg/dL (ref 0.3–1.2)
Total Protein: 7.2 g/dL (ref 6.5–8.1)

## 2020-10-24 LAB — POCT I-STAT, CHEM 8
BUN: 11 mg/dL (ref 8–23)
Calcium, Ion: 1.31 mmol/L (ref 1.15–1.40)
Chloride: 98 mmol/L (ref 98–111)
Creatinine, Ser: 0.5 mg/dL (ref 0.44–1.00)
Glucose, Bld: 170 mg/dL — ABNORMAL HIGH (ref 70–99)
HCT: 36 % (ref 36.0–46.0)
Hemoglobin: 12.2 g/dL (ref 12.0–15.0)
Potassium: 3.4 mmol/L — ABNORMAL LOW (ref 3.5–5.1)
Sodium: 133 mmol/L — ABNORMAL LOW (ref 135–145)
TCO2: 24 mmol/L (ref 22–32)

## 2020-10-24 LAB — PROTIME-INR
INR: 1 (ref 0.8–1.2)
Prothrombin Time: 13.3 seconds (ref 11.4–15.2)

## 2020-10-24 LAB — RESP PANEL BY RT-PCR (FLU A&B, COVID) ARPGX2
Influenza A by PCR: NEGATIVE
Influenza B by PCR: NEGATIVE
SARS Coronavirus 2 by RT PCR: NEGATIVE

## 2020-10-24 LAB — LIPID PANEL
Cholesterol: 135 mg/dL (ref 0–200)
HDL: 60 mg/dL (ref >40)
LDL Cholesterol: 55 mg/dL (ref 0–99)
Total CHOL/HDL Ratio: 2.3 RATIO
Triglycerides: 100 mg/dL (ref <150)
VLDL: 20 mg/dL (ref 0–40)

## 2020-10-24 LAB — TROPONIN I (HIGH SENSITIVITY): Troponin I (High Sensitivity): 916 ng/L (ref <18)

## 2020-10-24 LAB — MRSA PCR SCREENING: MRSA by PCR: NEGATIVE

## 2020-10-24 LAB — APTT: aPTT: 27 seconds (ref 24–36)

## 2020-10-24 SURGERY — LEFT HEART CATH AND CORONARY ANGIOGRAPHY
Anesthesia: LOCAL

## 2020-10-24 MED ORDER — FLUTICASONE FUROATE-VILANTEROL 100-25 MCG/INH IN AEPB
1.0000 | INHALATION_SPRAY | Freq: Every day | RESPIRATORY_TRACT | Status: DC
  Administered 2020-10-25 – 2020-10-29 (×5): 1 via RESPIRATORY_TRACT
  Filled 2020-10-24: qty 28

## 2020-10-24 MED ORDER — LABETALOL HCL 5 MG/ML IV SOLN
10.0000 mg | INTRAVENOUS | Status: AC | PRN
  Administered 2020-10-24: 10 mg via INTRAVENOUS

## 2020-10-24 MED ORDER — NICOTINE 7 MG/24HR TD PT24
7.0000 mg | MEDICATED_PATCH | Freq: Every day | TRANSDERMAL | Status: DC
  Administered 2020-10-25 – 2020-10-29 (×5): 7 mg via TRANSDERMAL
  Filled 2020-10-24 (×6): qty 1

## 2020-10-24 MED ORDER — LEVOTHYROXINE SODIUM 75 MCG PO TABS
175.0000 ug | ORAL_TABLET | Freq: Every day | ORAL | Status: DC
  Administered 2020-10-26 – 2020-10-29 (×4): 175 ug via ORAL
  Filled 2020-10-24 (×5): qty 1

## 2020-10-24 MED ORDER — SODIUM CHLORIDE 0.9% FLUSH
3.0000 mL | Freq: Two times a day (BID) | INTRAVENOUS | Status: DC
  Administered 2020-10-24 – 2020-10-26 (×3): 3 mL via INTRAVENOUS

## 2020-10-24 MED ORDER — SODIUM CHLORIDE 0.9 % IV SOLN: 250.0000 mL | INTRAVENOUS | Status: DC | PRN

## 2020-10-24 MED ORDER — SODIUM CHLORIDE 0.9 % IV SOLN
INTRAVENOUS | Status: AC | PRN
  Administered 2020-10-24: 50 mL/h via INTRAVENOUS

## 2020-10-24 MED ORDER — ACETAMINOPHEN 325 MG PO TABS
650.0000 mg | ORAL_TABLET | ORAL | Status: DC | PRN
  Administered 2020-10-26: 650 mg via ORAL
  Filled 2020-10-24: qty 2

## 2020-10-24 MED ORDER — IOHEXOL 350 MG/ML SOLN
INTRAVENOUS | Status: DC | PRN
  Administered 2020-10-24: 50 mL

## 2020-10-24 MED ORDER — METOPROLOL SUCCINATE ER 25 MG PO TB24: 12.5000 mg | ORAL_TABLET | Freq: Every day | ORAL | Status: DC

## 2020-10-24 MED ORDER — HEPARIN (PORCINE) IN NACL 1000-0.9 UT/500ML-% IV SOLN
INTRAVENOUS | Status: AC
  Filled 2020-10-24: qty 500

## 2020-10-24 MED ORDER — SODIUM CHLORIDE 0.9 % IV SOLN: INTRAVENOUS | Status: AC

## 2020-10-24 MED ORDER — FENTANYL CITRATE (PF) 100 MCG/2ML IJ SOLN
INTRAMUSCULAR | Status: DC | PRN
  Administered 2020-10-24: 25 ug via INTRAVENOUS

## 2020-10-24 MED ORDER — HEPARIN SODIUM (PORCINE) 1000 UNIT/ML IJ SOLN
INTRAMUSCULAR | Status: AC
  Filled 2020-10-24: qty 1

## 2020-10-24 MED ORDER — LABETALOL HCL 5 MG/ML IV SOLN
INTRAVENOUS | Status: AC
  Filled 2020-10-24: qty 4

## 2020-10-24 MED ORDER — UMECLIDINIUM BROMIDE 62.5 MCG/INH IN AEPB
INHALATION_SPRAY | Freq: Every day | RESPIRATORY_TRACT | Status: DC
  Filled 2020-10-24: qty 1

## 2020-10-24 MED ORDER — VERAPAMIL HCL 2.5 MG/ML IV SOLN
INTRAVENOUS | Status: AC
  Filled 2020-10-24: qty 2

## 2020-10-24 MED ORDER — CLOPIDOGREL BISULFATE 75 MG PO TABS
75.0000 mg | ORAL_TABLET | Freq: Every day | ORAL | Status: DC
  Administered 2020-10-25 – 2020-10-29 (×5): 75 mg via ORAL
  Filled 2020-10-24 (×5): qty 1

## 2020-10-24 MED ORDER — HEPARIN SODIUM (PORCINE) 1000 UNIT/ML IJ SOLN
INTRAMUSCULAR | Status: DC | PRN
  Administered 2020-10-24: 6000 [IU] via INTRAVENOUS

## 2020-10-24 MED ORDER — ONDANSETRON HCL 4 MG/2ML IJ SOLN
INTRAMUSCULAR | Status: AC
  Filled 2020-10-24: qty 2

## 2020-10-24 MED ORDER — POTASSIUM CHLORIDE CRYS ER 20 MEQ PO TBCR: 40.0000 meq | EXTENDED_RELEASE_TABLET | Freq: Once | ORAL | Status: DC

## 2020-10-24 MED ORDER — NITROGLYCERIN 1 MG/10 ML FOR IR/CATH LAB
INTRA_ARTERIAL | Status: DC | PRN
  Administered 2020-10-24: 200 ug via INTRA_ARTERIAL
  Administered 2020-10-24: 400 ug via INTRA_ARTERIAL

## 2020-10-24 MED ORDER — LIDOCAINE HCL (PF) 1 % IJ SOLN
INTRAMUSCULAR | Status: DC | PRN
  Administered 2020-10-24: 2 mL

## 2020-10-24 MED ORDER — UMECLIDINIUM BROMIDE 62.5 MCG/INH IN AEPB
1.0000 | INHALATION_SPRAY | Freq: Every day | RESPIRATORY_TRACT | Status: DC
  Administered 2020-10-25 – 2020-10-29 (×5): 1 via RESPIRATORY_TRACT
  Filled 2020-10-24: qty 7

## 2020-10-24 MED ORDER — LIDOCAINE HCL (PF) 1 % IJ SOLN
INTRAMUSCULAR | Status: AC
  Filled 2020-10-24: qty 30

## 2020-10-24 MED ORDER — ROSUVASTATIN CALCIUM 20 MG PO TABS
20.0000 mg | ORAL_TABLET | Freq: Every day | ORAL | Status: DC
  Administered 2020-10-25 – 2020-10-29 (×5): 20 mg via ORAL
  Filled 2020-10-24 (×5): qty 1

## 2020-10-24 MED ORDER — MIDAZOLAM HCL 2 MG/2ML IJ SOLN
INTRAMUSCULAR | Status: AC
  Filled 2020-10-24: qty 2

## 2020-10-24 MED ORDER — ONDANSETRON HCL 4 MG/2ML IJ SOLN
4.0000 mg | Freq: Four times a day (QID) | INTRAMUSCULAR | Status: DC | PRN
  Administered 2020-10-24 – 2020-10-28 (×7): 4 mg via INTRAVENOUS
  Filled 2020-10-24 (×7): qty 2

## 2020-10-24 MED ORDER — HYDRALAZINE HCL 20 MG/ML IJ SOLN: 10.0000 mg | INTRAMUSCULAR | Status: AC | PRN

## 2020-10-24 MED ORDER — NITROGLYCERIN 1 MG/10 ML FOR IR/CATH LAB
INTRA_ARTERIAL | Status: AC
  Filled 2020-10-24: qty 10

## 2020-10-24 MED ORDER — HEPARIN (PORCINE) IN NACL 1000-0.9 UT/500ML-% IV SOLN
INTRAVENOUS | Status: DC | PRN
  Administered 2020-10-24 (×2): 500 mL

## 2020-10-24 MED ORDER — POTASSIUM CHLORIDE 10 MEQ/100ML IV SOLN
10.0000 meq | Freq: Once | INTRAVENOUS | Status: AC
  Administered 2020-10-24: 10 meq via INTRAVENOUS
  Filled 2020-10-24: qty 100

## 2020-10-24 MED ORDER — NITROGLYCERIN 0.4 MG SL SUBL: 0.4000 mg | SUBLINGUAL_TABLET | SUBLINGUAL | Status: DC | PRN

## 2020-10-24 MED ORDER — FENTANYL CITRATE (PF) 100 MCG/2ML IJ SOLN
INTRAMUSCULAR | Status: AC
  Filled 2020-10-24: qty 2

## 2020-10-24 MED ORDER — VERAPAMIL HCL 2.5 MG/ML IV SOLN
INTRAVENOUS | Status: DC | PRN
  Administered 2020-10-24: 10 mL via INTRA_ARTERIAL

## 2020-10-24 MED ORDER — MIDAZOLAM HCL 2 MG/2ML IJ SOLN
INTRAMUSCULAR | Status: DC | PRN
  Administered 2020-10-24: 1 mg via INTRAVENOUS

## 2020-10-24 MED ORDER — ACETAMINOPHEN 500 MG PO TABS
1000.0000 mg | ORAL_TABLET | Freq: Four times a day (QID) | ORAL | Status: DC | PRN
  Administered 2020-10-27: 1000 mg via ORAL
  Filled 2020-10-24: qty 2

## 2020-10-24 MED ORDER — AZELASTINE HCL 0.1 % NA SOLN
2.0000 | NASAL | Status: DC | PRN
  Filled 2020-10-24: qty 30

## 2020-10-24 MED ORDER — SODIUM CHLORIDE 0.9% FLUSH: 3.0000 mL | INTRAVENOUS | Status: DC | PRN

## 2020-10-24 SURGICAL SUPPLY — 12 items
CATH 5FR JL3.5 JR4 ANG PIG MP (CATHETERS) ×2 IMPLANT
DEVICE RAD TR BAND REGULAR (VASCULAR PRODUCTS) ×2 IMPLANT
ELECT DEFIB PAD ADLT CADENCE (PAD) ×2 IMPLANT
GLIDESHEATH SLEND SS 6F .021 (SHEATH) ×2 IMPLANT
GUIDEWIRE INQWIRE 1.5J.035X260 (WIRE) ×1 IMPLANT
INQWIRE 1.5J .035X260CM (WIRE) ×2
KIT ENCORE 26 ADVANTAGE (KITS) ×2 IMPLANT
KIT HEART LEFT (KITS) ×2 IMPLANT
PACK CARDIAC CATHETERIZATION (CUSTOM PROCEDURE TRAY) ×2 IMPLANT
TRANSDUCER W/STOPCOCK (MISCELLANEOUS) ×2 IMPLANT
TUBING CIL FLEX 10 FLL-RA (TUBING) ×2 IMPLANT
VALVE COPILOT STAT (MISCELLANEOUS) ×2 IMPLANT

## 2020-10-24 NOTE — H&P (Addendum)
Cardiology History and Physical:   Patient ID: Joy Williams MRN: [id number]; DOB: Oct 30, 1950  Admit date: 10/24/2020 Date of Consult: 10/24/2020  PCP:  Lorrene Reid, PA-C   [facility] Prov[facility] Cardiologist:  Freada Bergeron, MD        Patient Profile:   Joy Williams is a 70 y.o. 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  History of Present Illness:   [name] was seen by Dr. Chase Caller on 10/20/2020.  He was seen heard from pulmonary fibrosis, but no lung cancer was noted.  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  She was in her usual state of health today.  She had sudden onset of substernal chest pain, associated with nausea and some vomiting.  EMS was called.  Her ECG had ST elevation in lead I and aVL, consistent with an anterior STEMI.  She was transported emergently to the hospital.  In route, she was given ASA 324 mg, Zofran 4 mg, and sublingual nitroglycerin x1.  She has never had symptoms like this before.  On arrival to the hospital, she was still having pain at a 5/10 and her ECG was still significantly abnormal.  She was taken emergently to the Cath Lab.   Past Medical History:  Diagnosis Date  . Allergy   . Asthma    pt denies this, but is on Dulera  . Carotid artery disease ([place]) 2019   1-39% stenosis by Dopplers   . Chronic diastolic congestive heart failure ([place])   . 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 COPD (chronic obstructive pulmonary disease) ([place])   . Family history of adverse reaction to anesthesia    Mother- very  sensitive to medication  . Heart murmur    MVP  . Hypothyroidism   . Lung cancer ([place]) 12/30/2014   Synchronous primaries:  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, each treated by wedge resection with post-op adjuvant [facility] at [facility]  . MVP (mitral valve prolapse) 2018   s/p Minimally-Invasive Mitral Valve Replacement w/  [facility] Mitral bovine bioprosthetic tissue valve (size 14mm, model # 7300TFX, serial # [id number])  . PONV (postoperative nausea and vomiting)   . Raynaud's syndrome   . 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   31 mm [facility] mitral bovine bioprosthetic tissue valve placed via right mini thoracotomy approach  . Severe mitral regurgitation 11/15/2014  . Shortness of breath dyspnea    with exertion  . STD (sexually transmitted disease)   . STEMI (ST elevation myocardial infarction) ([place]) 10/24/2020  . Telangiectasia     Past Surgical History:  Procedure Laterality Date  . BACK SURGERY     x 3  Disectomy  . BREAST BIOPSY Left   . CARDIAC CATHETERIZATION N/A 03/08/2016   Procedure: Right/Left Heart Cath and Coronary Angiography;  Surgeon: Sherren Mocha, MD;  Location: [place] CV LAB;  Service: Cardiovascular;  Laterality: N/A;  . CLAVICLE SURGERY Left 2013   plate to left collar bone  . ENDARTERECTOMY Right 08/09/2020   Procedure: RIGHT CAROTID ENDARTERECTOMY;  Surgeon: Waynetta Sandy, MD;  Location: [place];  Service: Vascular;  Laterality: Right;  . LAPAROSCOPY     ? reason-age 78   . LEFT HEART CATH AND CORONARY ANGIOGRAPHY N/A 02/25/2019   Procedure: LEFT HEART CATH AND CORONARY ANGIOGRAPHY;  Surgeon: Jettie Booze, MD;  Location: [place] CV LAB;  Service: Cardiovascular;  Laterality: N/A;  . LUNG CANCER SURGERY    . MITRAL VALVE REPAIR Right 06/27/2016   Procedure: MINIMALLY INVASIVE MITRAL VALVE REPLACEMENT;  Surgeon: Rexene Alberts, MD;  Location: [place];  Service: Open Heart  Surgery;  Laterality: Right;  . TEE WITHOUT CARDIOVERSION N/A 02/22/2016   Procedure: TRANSESOPHAGEAL ECHOCARDIOGRAM (TEE);  Surgeon: Dorothy Spark, MD;  Location: [facility];  Service: Cardiovascular;  Laterality: N/A;  . TEE WITHOUT CARDIOVERSION N/A 06/27/2016   Procedure: TRANSESOPHAGEAL ECHOCARDIOGRAM (TEE);  Surgeon: Rexene Alberts, MD;  Location: [place];  Service: Open Heart Surgery;  Laterality: N/A;  . TONSILLECTOMY    . VIDEO ASSISTED THORACOSCOPY (VATS)/WEDGE RESECTION Left 12/30/2014   Bronchoscopy, Mediastinoscopy, Left VATS for Wedge resection LUL x2 adn LLL x1 - Dr. Elenor Quinones at [facility]  . VOCAL CORD INJECTION Left 2017   injected with botox  . wrist surgery Left 2015   plate to wrist      Home Medications:  Prior to Admission medications   Medication Sig Start Date End Date Taking? Authorizing Provider  acetaminophen (TYLENOL) 500 MG tablet Take 1,000 mg by mouth every 6 (six) hours as needed for mild pain or headache.    [provider]  azelastine (ASTELIN) 0.1 % nasal spray Place 2 sprays into both nostrils as needed for rhinitis. Use in each nostril as directed    [provider]  clopidogrel (PLAVIX) 75 MG tablet TAKE 1 TABLET BY MOUTH EVERY DAY NEED OFFICE VISIT 08/15/20   Freada Bergeron, MD  Fluticasone-Umeclidin-Vilant (TRELEGY ELLIPTA) 100-62.5-25 MCG/INH AEPB TAKE 1 PUFF BY MOUTH EVERY DAY 09/13/20   Tanda Rockers, MD  levothyroxine (SYNTHROID) 175 MCG tablet TAKE 1 TABLET BY MOUTH DAILY BEFORE BREAKFAST 06/16/20   Abonza, Maritza, PA-C  MELATONIN PO Take 1 tablet by mouth at bedtime as needed (sleep).    [provider]  metoprolol succinate (TOPROL XL) 25 MG 24 hr tablet Take 0.5 tablets (12.5 mg total) by mouth daily. 07/27/20   Freada Bergeron, MD  Multiple Vitamins-Minerals (MULTIVITAMIN WITH MINERALS) tablet Take 1 tablet by mouth daily.    [provider]  nicotine (NICODERM CQ - DOSED IN MG/24 HR) 7 mg/24hr patch  Place 1 patch (7 mg total) onto the skin daily. Patient not taking: Reported on 10/12/2020 08/10/20   Geradine Girt, DO  nitroGLYCERIN (NITROSTAT) 0.4 MG SL tablet Place 1 tablet (0.4 mg total) under the tongue every 5 (five) minutes as needed for chest pain. Patient not taking: Reported on 10/12/2020 03/01/19   Richardson Dopp T, PA-C  rosuvastatin (CRESTOR) 20 MG tablet Take 1 tablet (20 mg total) by mouth daily. 08/15/20   Freada Bergeron, MD  Turmeric 500 MG CAPS Take 500 mg by mouth daily.    [provider]  vitamin B-12 (CYANOCOBALAMIN) 1000 MCG tablet Take 1 tablet (1,000 mcg total) by mouth daily. 08/10/20   Geradine Girt, DO    Inpatient Medications: Scheduled Meds:  Continuous Infusions:  PRN Meds:   Allergies:    Allergies  Allergen Reactions  . Penicillins Hives    Unknown, occurred as a child Has  patient had a PCN reaction causing immediate rash, facial/tongue/throat swelling, SOB or lightheadedness with hypotension: Yes Has patient had a PCN reaction causing severe rash involving mucus membranes or skin necrosis: No Has patient had a PCN reaction that required hospitalization  Has patient had a PCN reaction occurring within the last 10 years: No If all of the above answers are "NO", then may proceed with Cephalosporin use. No    Social History:   Social History   Socioeconomic History  . Marital status: Divorced    Spouse name: Not on file  . Number of children: Not on file  . Years of education: 74  . Highest education level: Not on file  Occupational History  . Occupation: Retired  Tobacco Use  . Smoking status: Current Some Day Smoker    Packs/day: 1.00    Years: 24.00    Pack years: 24.00    Types: E-cigarettes, Cigarettes    Start date: 05/21/1983  . Smokeless tobacco: Never Used  . Tobacco comment: currently smoking 3cigs per day as of 10/12/20  Vaping Use  . Vaping Use: Former  . Quit date: 05/20/2006  Substance and Sexual Activity  .  Alcohol use: Yes    Alcohol/week: 2.0 standard drinks    Types: 1 Shots of liquor, 1 Standard drinks or equivalent per week  . Drug use: No  . Sexual activity: Never    Partners: Male    Birth control/protection: Post-menopausal  Other Topics Concern  . Not on file  Social History Narrative  . Not on file   Social Determinants of Health   Financial Resource Strain: Not on file  Food Insecurity: Not on file  Transportation Needs: Not on file  Physical Activity: Not on file  Stress: Not on file  Social Connections: Not on file  Intimate Partner Violence: Not on file    Family History:   Family History  Problem Relation Age of Onset  . Mitral valve prolapse Mother   . Dementia Father   . Prostate cancer Father   . Mitral valve prolapse Brother   . Mitral valve prolapse Sister   . Colon cancer Neg Hx   . Colon polyps Neg Hx   . Rectal cancer Neg Hx   . Esophageal cancer Neg Hx   . Stomach cancer Neg Hx   . Heart attack Neg Hx     Family Status  Relation Name Status  . Mother  Deceased at age 38  . Father  Alive  . Brother  Alive  . Sister  Alive  . MGM  Deceased  . MGF  Deceased  . PGM  Deceased  . PGF  Deceased  . Brother  (Not Specified)  . Sister  (Not Specified)  . Neg Hx  (Not Specified)    ROS:  Please see the history of present illness.  No bleeding issues, no recent illnesses. All other ROS reviewed and negative.     Physical Exam/Data:   Vitals:   10/24/20 1541 10/24/20 1545 10/24/20 1549 10/24/20 1554  BP:  (!) 167/79 (!) 150/77 (!) 151/79  Pulse: 92 85 89 91  Resp:  12 19 15   SpO2:  100% 100% 100%   No intake or output data in the 24 hours ending 10/24/20 1602 Last 3 Weights 10/12/2020 09/12/2020 08/23/2020  Weight (lbs) 140 lb 6.4 oz 141 lb 142 lb  Weight (kg) 63.685 kg 63.957 kg 64.411 kg     General:  Well nourished, well developed, in acute  distress HEENT: normal Lymph: no adenopathy Neck: no JVD Endocrine:  No thryomegaly Vascular: No  carotid bruits; 4/4 extremity pulses 2+  Cardiac:  normal S1, S2; RRR; 2/6 murmur  Lungs: Rales to auscultation bilaterally, no wheezing, rhonchi  Abd: soft, nontender, no hepatomegaly  Ext: no edema Musculoskeletal:  No deformities, BUE and BLE strength normal and equal Skin: warm and dry  Neuro:  CNs 2-12 intact, no focal abnormalities noted Psych:  Normal affect   EKG:  The EKG was personally reviewed and demonstrates: Sinus rhythm, ST elevation noted in lead I and aVL  Relevant CV Studies:  ECHO: 08/07/2020 1. Left ventricular ejection fraction, by estimation, is 65 to 70%. The left ventricle has normal function. The left ventricle has no regional wall motion abnormalities. There is mild left ventricular hypertrophy.  2. Right ventricular systolic function is normal. The right ventricular  size is normal.  3. Left atrial size was moderately dilated.  4. Mild mitral valve regurgitation. Moderate mitral annular  calcification.  5. The aortic valve is tricuspid. Aortic valve regurgitation is not  visualized. Mild aortic valve sclerosis is present, with no evidence of aortic valve stenosis.  6. The inferior vena cava is normal in size with greater than 50%  respiratory variability, suggesting right atrial pressure of 3 mmHg.   CARDIAC CATH: 02/25/2019  The left ventricular systolic function is normal.  LV end diastolic pressure is normal.  The left ventricular ejection fraction is 55-65% by visual estimate.  There is no aortic valve stenosis.  No angiographically apparent coronary artery disease.   No angiographically apparent coronary artery disease.  Further imaging per Dr. Debara Pickett.   Laboratory Data:  High Sensitivity Troponin:  No results for input(s): TROPONINIHS in the last 720 hours.   Chemistry Recent Labs  Lab 10/24/20 1542  NA 133*  K 3.4*  CL 98  GLUCOSE 170*  BUN 11  CREATININE 0.50    No results for input(s): PROT, ALBUMIN, AST, ALT, ALKPHOS,  BILITOT in the last 168 hours. Hematology Recent Labs  Lab 10/24/20 1542  HGB 12.2  HCT 36.0   BNPNo results for input(s): BNP, PROBNP in the last 168 hours.  DDimer No results for input(s): DDIMER in the last 168 hours. Lab Results  Component Value Date   TSH 0.603 06/15/2020   Lab Results  Component Value Date   HGBA1C 5.6 08/07/2020   Lab Results  Component Value Date   CHOL 116 08/07/2020   HDL 46 08/07/2020   LDLCALC 50 08/07/2020   LDLDIRECT 63 06/15/2020   TRIG 102 08/07/2020   CHOLHDL 2.5 08/07/2020     Radiology/Studies:  No results found.   Assessment and Plan:   1. Anterior STEMI - She is having ongoing pain and her ECG is significantly abnormal.  She is being taken directly to the Cath Lab with further evaluation and treatment depending on the results - She will be continued on her home medications and screened for cardiac risk factors and their control   Risk Assessment/Risk Scores:     TIMI Risk Score for ST  Elevation MI:   The patient's TIMI risk score is 5, which indicates a 12.4% risk of all cause mortality at 30 days.     For questions or updates, please contact [place] Please consult [email or web address] for contact info under    Signed, Rosaria Ferries, PA-C  10/24/2020 4:02 PM   I have examined the patient and reviewed assessment and plan and discussed with patient.  Agree with above as stated.  Anterolateral ST changes on ECG.  I made the decision for emergent cardiac catheterization.  Cardiac cath showed widely patent coronary arteries.  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  There was a distinct change from her 2020 left ventriculogram compared to today's study.  She will need medical therapy for her LV dysfunction.  Findings were discussed with the daughter.  Larae Grooms

## 2020-10-24 NOTE — Progress Notes (Signed)
Pt states she doesn't feel well, dry heaving noted, +thick saliva, reddish-brown in color, see MAR for medications given, cool wash cloth and emesis bag given, gown changed, support given, safety maintained

## 2020-10-25 ENCOUNTER — Other Ambulatory Visit (HOSPITAL_COMMUNITY): Payer: Self-pay

## 2020-10-25 ENCOUNTER — Encounter (HOSPITAL_COMMUNITY): Payer: Self-pay | Admitting: Interventional Cardiology

## 2020-10-25 ENCOUNTER — Other Ambulatory Visit: Payer: Self-pay

## 2020-10-25 ENCOUNTER — Inpatient Hospital Stay (HOSPITAL_COMMUNITY): Payer: Medicare Other

## 2020-10-25 DIAGNOSIS — I5021 Acute systolic (congestive) heart failure: Secondary | ICD-10-CM | POA: Diagnosis present

## 2020-10-25 DIAGNOSIS — J84112 Idiopathic pulmonary fibrosis: Secondary | ICD-10-CM

## 2020-10-25 DIAGNOSIS — M349 Systemic sclerosis, unspecified: Secondary | ICD-10-CM

## 2020-10-25 DIAGNOSIS — I5032 Chronic diastolic (congestive) heart failure: Secondary | ICD-10-CM | POA: Diagnosis present

## 2020-10-25 DIAGNOSIS — E876 Hypokalemia: Secondary | ICD-10-CM

## 2020-10-25 LAB — ECHOCARDIOGRAM COMPLETE
Area-P 1/2: 7.59 cm2
MV VTI: 2.69 cm2
S' Lateral: 2.8 cm

## 2020-10-25 LAB — HEMOGLOBIN A1C
Hgb A1c MFr Bld: 5.5 % (ref 4.8–5.6)
Mean Plasma Glucose: 111 mg/dL

## 2020-10-25 LAB — TROPONIN I (HIGH SENSITIVITY): Troponin I (High Sensitivity): 4990 ng/L (ref <18)

## 2020-10-25 LAB — BASIC METABOLIC PANEL
Anion gap: 13 (ref 5–15)
BUN: 12 mg/dL (ref 8–23)
CO2: 24 mmol/L (ref 22–32)
Calcium: 9.8 mg/dL (ref 8.9–10.3)
Chloride: 95 mmol/L — ABNORMAL LOW (ref 98–111)
Creatinine, Ser: 0.71 mg/dL (ref 0.44–1.00)
GFR, Estimated: >60 mL/min (ref >60)
Glucose, Bld: 108 mg/dL — ABNORMAL HIGH (ref 70–99)
Potassium: 4.1 mmol/L (ref 3.5–5.1)
Sodium: 132 mmol/L — ABNORMAL LOW (ref 135–145)

## 2020-10-25 LAB — MAGNESIUM: Magnesium: 1.9 mg/dL (ref 1.7–2.4)

## 2020-10-25 LAB — TSH: TSH: 0.478 u[IU]/mL (ref 0.350–4.500)

## 2020-10-25 MED ORDER — POTASSIUM CHLORIDE CRYS ER 20 MEQ PO TBCR: 60.0000 meq | EXTENDED_RELEASE_TABLET | Freq: Once | ORAL | Status: DC

## 2020-10-25 MED ORDER — SODIUM CHLORIDE 0.9 % IV SOLN
12.5000 mg | Freq: Four times a day (QID) | INTRAVENOUS | Status: DC | PRN
  Administered 2020-10-25 – 2020-10-26 (×3): 12.5 mg via INTRAVENOUS
  Filled 2020-10-25 (×5): qty 0.5

## 2020-10-25 MED ORDER — CHLORHEXIDINE GLUCONATE CLOTH 2 % EX PADS
6.0000 | MEDICATED_PAD | Freq: Every day | CUTANEOUS | Status: DC
  Administered 2020-10-25 – 2020-10-27 (×3): 6 via TOPICAL

## 2020-10-25 MED ORDER — METOPROLOL SUCCINATE ER 25 MG PO TB24
25.0000 mg | ORAL_TABLET | Freq: Every day | ORAL | Status: DC
  Administered 2020-10-25 – 2020-10-26 (×2): 25 mg via ORAL
  Filled 2020-10-25 (×2): qty 1

## 2020-10-25 MED ORDER — METOCLOPRAMIDE HCL 5 MG/ML IJ SOLN
5.0000 mg | Freq: Three times a day (TID) | INTRAMUSCULAR | Status: DC | PRN
  Administered 2020-10-25: 5 mg via INTRAVENOUS
  Filled 2020-10-25: qty 2

## 2020-10-25 MED ORDER — LOSARTAN POTASSIUM 25 MG PO TABS
25.0000 mg | ORAL_TABLET | Freq: Every day | ORAL | Status: DC
  Administered 2020-10-25: 25 mg via ORAL
  Filled 2020-10-25: qty 1

## 2020-10-25 NOTE — Progress Notes (Addendum)
Heart Failure Stewardship Pharmacist Progress Note   PCP: Lorrene Reid, PA-C PCP-Cardiologist: Freada Bergeron, MD    HPI:  78 yoF w/ PMH of D-CHF, NSCLC, MVR (replacement w/ bioprosthetic) who presented w/ CP and N/V on 6/7, evaluated for STEMI. 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 ECHO revealed EF 25-30%.   Current HF Medications: - Losartan 25mg  daily - Metoprolol succinate 25mg  daily  Prior to admission HF Medications: - Metoprolol succinate 12.5mg  daily  Pertinent Lab Values: Serum creatinine 0.7, BUN 12, Potassium 4.1, Sodium 132, Magnesium 1.9  Vital Signs: Weight: 140 lbs (admission weight: 140 lbs) Blood pressure: 100s/70s  Heart rate: 90s  Medication Assistance / Insurance Benefits Check: Does the patient have prescription insurance?  Yes Type of insurance plan: UHC Medicare Supplement  Does the patient qualify for medication assistance through manufacturers or grants?   Pending income information  Eligible grants and/or patient assistance programs: pending Medication assistance applications in progress: N/A Medication assistance applications approved: N/A Approved medication assistance renewals will be completed by: pending  Outpatient Pharmacy:  [facility] to admission [facility] Is the patient willing to use [facility] at discharge? Yes Is the patient willing to transition their outpatient pharmacy to utilize a [facility] outpatient pharmacy?   Pending    Assessment: 1. Acute on chronic systolic CHF (EF 19-37%), due to NICM (Stress-induced Takotsubo CM). NYHA class II symptoms. - Continue metoprolol 25mg  - Continue losartan 25mg  - BP dropped from 140s to 100s after addition of ARB and BB - Consider adding Jardiance 10mg  tomorrow - If BP remains elevated, can add spironolactone 12.5 mg daily - Last iron panel 07/2020 - TSAT 15, Ferritin 81   Plan: 1) Medication changes recommended at this time: -  Repeat iron panel - Suggest adding Jardiance 10 mg daily vs spironolactone 12.5 mg daily tomorrow pending BP trends  2) Patient assistance: - Jardiance co-pay $47 - Entresto co-pay $44 - Wilder [place]ade is not covered under patient's formulary - Appreciate assistance from Willette Alma, CPhT  3)  Education  - To be completed prior to discharge - HF TOC appt 6/20    Michela Pitcher) [place], PharmD Student

## 2020-10-25 NOTE — Progress Notes (Signed)
  Echocardiogram 2D Echocardiogram has been performed.  Matilde Bash 10/25/2020, 10:18 AM

## 2020-10-25 NOTE — Progress Notes (Signed)
Progress Note  Patient Name: Joy Williams Date of Encounter: 10/25/2020  [facility] Cardiologist: Freada Bergeron, MD   Subjective   Persistent nausea and vomiting. Has some chest pressure but improved from yesterday. Denies any increase in SOB. Complains of leg cramps.  Inpatient Medications    Scheduled Meds: . Chlorhexidine Gluconate Cloth  6 each Topical Daily  . clopidogrel  75 mg Oral Daily  . fluticasone furoate-vilanterol  1 puff Inhalation Daily   And  . umeclidinium bromide  1 puff Inhalation Daily  . levothyroxine  175 mcg Oral Q0600  . losartan  25 mg Oral Daily  . metoprolol succinate  25 mg Oral Daily  . nicotine  7 mg Transdermal Daily  . rosuvastatin  20 mg Oral Daily  . sodium chloride flush  3 mL Intravenous Q12H   Continuous Infusions: . sodium chloride    . promethazine (PHENERGAN) injection (IM or IVPB)     PRN Meds: sodium chloride, acetaminophen, acetaminophen, azelastine, nitroGLYCERIN, ondansetron (ZOFRAN) IV, promethazine (PHENERGAN) injection (IM or IVPB), sodium chloride flush   Vital Signs    Vitals:   10/25/20 0600 10/25/20 0700 10/25/20 0734 10/25/20 0743  BP: (!) 143/87 135/77 135/77   Pulse: (!) 110 100 (!) 102   Resp: 18 (!) 21 20   Temp:    98.4 F (36.9 C)  TempSrc:    Oral  SpO2: 98% 96% 98%     Intake/Output Summary (Last 24 hours) at 10/25/2020 0804 Last data filed at 10/25/2020 0700 Gross per 24 hour  Intake 191.25 ml  Output 1300 ml  Net -1108.75 ml   Last 3 Weights 10/12/2020 09/12/2020 08/23/2020  Weight (lbs) 140 lb 6.4 oz 141 lb 142 lb  Weight (kg) 63.685 kg 63.957 kg 64.411 kg      Telemetry    NSR with PVCs, couplets - Personally Reviewed  ECG    Pending today - Personally Reviewed  Physical Exam   GEN: No acute distress. Chronically ill appearing  Neck: No JVD Cardiac: RRR, no murmurs, rubs, or gallops.  Respiratory: fine bilateral crackles.  GI: Soft, nontender, non-distended  MS: No edema;  No deformity. Neuro:  Nonfocal  Psych: Normal affect   Labs    High Sensitivity Troponin:   Recent Labs  Lab 10/24/20 1545  TROPONINIHS 916*      Chemistry Recent Labs  Lab 10/24/20 1542 10/24/20 1545  NA 133* 132*  K 3.4* 3.3*  CL 98 99  CO2  --  23  GLUCOSE 170* 164*  BUN 11 11  CREATININE 0.50 0.68  CALCIUM  --  9.2  PROT  --  7.2  ALBUMIN  --  4.1  AST  --  33  ALT  --  18  ALKPHOS  --  57  BILITOT  --  0.5  GFRNONAA  --  >60  ANIONGAP  --  10     Hematology Recent Labs  Lab 10/24/20 1542 10/24/20 1545  WBC  --  10.8*  RBC  --  4.46  HGB 12.2 12.1  HCT 36.0 37.3  MCV  --  83.6  MCH  --  27.1  MCHC  --  32.4  RDW  --  12.4  PLT  --  259    BNPNo results for input(s): BNP, PROBNP in the last 168 hours.   DDimer No results for input(s): DDIMER in the last 168 hours.   Radiology    CARDIAC CATHETERIZATION  Addendum Date: 10/24/2020  Prox LAD lesion is 25% stenosed. No significant CAD.  The left ventricular systolic function is normal.  LV end diastolic pressure is mildly elevated.  The left ventricular ejection fraction is 25-35% by visual estimate.  There is no aortic valve stenosis.  Prior mitral valve surgery.  Despite lateral ST elevation, we only found nonobstructive coronary artery disease.  Left ventriculogram fits with a pattern of Takotsubo cardiomyopathy.  LV function significantly different from her prior catheterization.  She will need aggressive medical therapy for LV dysfunction. I discussed the findings with her daughter.   Result Date: 10/24/2020  Prox LAD lesion is 25% stenosed. No significant CAD.  The left ventricular systolic function is normal.  LV end diastolic pressure is mildly elevated.  The left ventricular ejection fraction is 25-35% by visual estimate.  There is no aortic valve stenosis.  Despite lateral ST elevation, we only found nonobstructive coronary artery disease.  Left ventriculogram fits with a pattern of  Takotsubo cardiomyopathy.  LV function significantly different from her prior catheterization.  She will need aggressive medical therapy for LV dysfunction. I discussed the findings with her daughter.   DG Chest Port 1 View  Result Date: 10/24/2020 CLINICAL DATA:  Chest pain EXAM: PORTABLE CHEST 1 VIEW COMPARISON:  02/24/2019 FINDINGS: Partial left lung resection with staple line again noted at the left apex. Mild bibasilar interstitial infiltrate is again seen, stable since prior examination, more severe within the left lung base in keeping with bibasilar pulmonary fibrosis. Chronic thickening of the pulmonary interstitium relates to mild underlying interstitial lung disease. No superimposed confluent pulmonary infiltrate. No pneumothorax or pleural effusion. Mitral valve replacement has been performed. Cardiac size is within normal limits. The pulmonary vascularity is normal. Remote left clavicle ORIF with instrumentation has been performed. No acute bone abnormality. IMPRESSION: No radiographic evidence of acute cardiopulmonary disease. Stable bibasilar pulmonary fibrotic change. Electronically Signed   By: Fidela Salisbury MD   On: 10/24/2020 20:55    Cardiac Studies   See above  Patient Profile     70 y.o. 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  Assessment & Plan    1. Stress induced cardiomyopathy/Takotsubo's syndrome. No clear stressors. Normal coronary anatomy at cath. Initial Ecg showed lateral ST elevation. Repeat Ecg is pending. Troponin 916 on admit - will repeat. Chest pressure is better. Will check Echo. Supportive care with Plavix, Toprol, ARB.  2. Nausea/vomiting. Poor relief with Zofran. Will try promethazine 3. Hypokalemia- will replete. Check Mg level 4. S/p MV  replacement with bioprosthetic valve. 5. S/p right  CEA in March. 6. NSCLC  7. Scleroderma.  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 Hypothyroid. TSH normal.  For questions or updates, please contact [place] Please consult [email or web address] for contact info under        Signed, Nargis Abrams [place], MD  10/25/2020, 8:04 AM

## 2020-10-25 NOTE — TOC Benefit Eligibility Note (Signed)
Patient Teacher, [name] as a foreign language completed.    The patient is currently admitted and upon discharge could be taking Entresto 24mg /26mg .  The current 30 day co-pay is, $47.00.    The patient is currently admitted and upon discharge could be taking Jardiance 10mg .  The current 30 day co-pay is, $47.00.   The patient is currently admitted and upon discharge could be taking Farxiga 10mg .  Not on Formulary  The patient is insured through [place], [place] Patient Advocate Specialist [place] Team Direct Number: [phone]  Fax: [phone]

## 2020-10-26 ENCOUNTER — Encounter (HOSPITAL_BASED_OUTPATIENT_CLINIC_OR_DEPARTMENT_OTHER): Payer: Self-pay

## 2020-10-26 ENCOUNTER — Encounter (HOSPITAL_COMMUNITY): Payer: Self-pay | Admitting: Interventional Cardiology

## 2020-10-26 DIAGNOSIS — E785 Hyperlipidemia, unspecified: Secondary | ICD-10-CM

## 2020-10-26 DIAGNOSIS — Z953 Presence of xenogenic heart valve: Secondary | ICD-10-CM

## 2020-10-26 DIAGNOSIS — I5021 Acute systolic (congestive) heart failure: Secondary | ICD-10-CM

## 2020-10-26 LAB — BASIC METABOLIC PANEL
Anion gap: 10 (ref 5–15)
BUN: 12 mg/dL (ref 8–23)
CO2: 24 mmol/L (ref 22–32)
Calcium: 8.9 mg/dL (ref 8.9–10.3)
Chloride: 95 mmol/L — ABNORMAL LOW (ref 98–111)
Creatinine, Ser: 0.74 mg/dL (ref 0.44–1.00)
GFR, Estimated: >60 mL/min (ref >60)
Glucose, Bld: 108 mg/dL — ABNORMAL HIGH (ref 70–99)
Potassium: 4.1 mmol/L (ref 3.5–5.1)
Sodium: 129 mmol/L — ABNORMAL LOW (ref 135–145)

## 2020-10-26 LAB — FERRITIN: Ferritin: 37 ng/mL (ref 11–307)

## 2020-10-26 LAB — IRON AND TIBC
Iron: 31 ug/dL (ref 28–170)
Saturation Ratios: 10 % — ABNORMAL LOW (ref 10.4–31.8)
TIBC: 325 ug/dL (ref 250–450)
UIBC: 294 ug/dL

## 2020-10-26 MED ORDER — SODIUM CHLORIDE 0.9 % IV BOLUS
250.0000 mL | Freq: Once | INTRAVENOUS | Status: AC
  Administered 2020-10-26: 250 mL via INTRAVENOUS

## 2020-10-26 MED ORDER — LOSARTAN POTASSIUM 25 MG PO TABS
12.5000 mg | ORAL_TABLET | Freq: Every day | ORAL | Status: DC
  Administered 2020-10-26: 12.5 mg via ORAL
  Filled 2020-10-26: qty 1

## 2020-10-26 MED ORDER — SODIUM CHLORIDE 0.9% FLUSH: 10.0000 mL | INTRAVENOUS | Status: DC | PRN

## 2020-10-26 MED ORDER — SODIUM CHLORIDE 0.9% FLUSH
10.0000 mL | Freq: Two times a day (BID) | INTRAVENOUS | Status: DC
  Administered 2020-10-26 – 2020-10-28 (×5): 10 mL

## 2020-10-26 NOTE — Progress Notes (Signed)
CARDIAC REHAB PHASE I   PRE:  Rate/Rhythm: 73 SR    BP: lying 87/56    SaO2: 96 RA  MODE:  Ambulation: 290 ft   POST:  Rate/Rhythm: 94 SR    BP: sitting 101/59     SaO2: wouldn't register   Pt BP low in bed (had been 79/56). Weak getting up but able to walk with min assist, no AD. Legs weak and staggered at times in hall. No c/o dizziness or SOB. Pt has congested cough and sts this is normal for her. Her SaO2 would not register after walk as she has Raynauds. Curious given pulmonary fibrosis what her SaO2 would be after walking.   Began discussing MI/takotsubo, restrictions. Pt voiced understanding. Gave her materials to view and will f/u tomorrow. Encouraged another walk this pm with staff. [place], ACSM 10/26/2020 1:48 PM

## 2020-10-26 NOTE — Progress Notes (Addendum)
Heart Failure Stewardship Pharmacist Progress Note   PCP: Lorrene Reid, PA-C PCP-Cardiologist: Freada Bergeron, MD    HPI:  69 yoF w/ PMH of D-CHF, NSCLC, MVR (replacement w/ bioprosthetic) who presented w/ CP and N/V on 6/7, evaluated for STEMI. 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 ECHO revealed EF 25-30%.   Current HF Medications: - Losartan 12.5mg  daily - Metoprolol succinate 25mg  daily  Prior to admission HF Medications: - Metoprolol succinate 12.5mg  daily  Pertinent Lab Values: Serum creatinine 0.74, BUN 12, Potassium 4.1, Sodium 129, Magnesium 1.9  Vital Signs: Weight: 134 lbs (admission weight: 140 lbs) Blood pressure: 90s/50s  Heart rate: 70-80s  Medication Assistance / Insurance Benefits Check: Does the patient have prescription insurance?  Yes Type of insurance plan: UHC Medicare Supplement  Does the patient qualify for medication assistance through manufacturers or grants?   Pending income information  Eligible grants and/or patient assistance programs: pending Medication assistance applications in progress: N/A Medication assistance applications approved: N/A Approved medication assistance renewals will be completed by: pending  Outpatient Pharmacy:  Prior to admission outpatient [facility] Is the patient willing to use [facility] at discharge? Yes Is the patient willing to transition their outpatient pharmacy to utilize a [facility] outpatient pharmacy?   Pending    Assessment: 1. Acute on chronic systolic CHF (EF 85-88%), due to NICM (Stress-induced Takotsubo CM). NYHA class II symptoms. - Continue metoprolol XL 25mg  - Decreased losartan to 12.5mg  w/ low BP today - Consider adding Jardiance 10mg  daily - Iron panel low - TSAT 10, Ferritin 37. Consider starting Feraheme 510 mg IV q72 hours x 2 doses - Consider spironolactone prior to discharge if BP allows   Plan: 1) Medication changes recommended at this  time: - Add Jardiance 10 mg daily  - Add Feraheme 510mg  IV, repeat in 3 days  2) Patient assistance: - Jardiance co-pay $47 Delene Loll co-pay $32 - Wilder Glade is not covered under patient's formulary - Appreciate assistance from Willette Alma, CPhT  3)  Education  - To be completed prior to discharge - HF TOC appt 6/20    Michela Pitcher) [place], PharmD Student

## 2020-10-26 NOTE — Progress Notes (Signed)
Page PA to advise of patient having low BP. Symptomatic -dizzy, lightheaded, and nausea. Pt then complained of just not feeling good and pain radiating from neck up to ear.  Bhagat , PA  returned call and advise to give 250 bolus.  RN place a order for bolus. Will continue to monitor.

## 2020-10-26 NOTE — Progress Notes (Addendum)
Progress Note  Patient Name: Joy Williams Date of Encounter: 10/26/2020  [facility] Cardiologist: Freada Bergeron, MD   Subjective   Nausea has resolved. Denies any chest pain or SOB. Has ambulated. A little weak.   Inpatient Medications    Scheduled Meds:  Chlorhexidine Gluconate Cloth  6 each Topical Daily   clopidogrel  75 mg Oral Daily   fluticasone furoate-vilanterol  1 puff Inhalation Daily   And   umeclidinium bromide  1 puff Inhalation Daily   levothyroxine  175 mcg Oral Q0600   losartan  12.5 mg Oral Daily   metoprolol succinate  25 mg Oral Daily   nicotine  7 mg Transdermal Daily   rosuvastatin  20 mg Oral Daily   sodium chloride flush  3 mL Intravenous Q12H   Continuous Infusions:  sodium chloride     promethazine (PHENERGAN) injection (IM or IVPB) Stopped (10/26/20 0057)   PRN Meds: sodium chloride, acetaminophen, acetaminophen, azelastine, nitroGLYCERIN, ondansetron (ZOFRAN) IV, promethazine (PHENERGAN) injection (IM or IVPB), sodium chloride flush   Vital Signs    Vitals:   10/26/20 0500 10/26/20 0600 10/26/20 0603 10/26/20 0700  BP: 96/64  (!) 89/61 (!) 89/59  Pulse: 81  84 80  Resp: 18  (!) 22 16  Temp:      TempSrc:      SpO2: 96%  93% 96%  Weight:  61.1 kg      Intake/Output Summary (Last 24 hours) at 10/26/2020 0730 Last data filed at 10/26/2020 0500 Gross per 24 hour  Intake 236 ml  Output 500 ml  Net -264 ml    Last 3 Weights 10/26/2020 10/12/2020 09/12/2020  Weight (lbs) 134 lb 11.2 oz 140 lb 6.4 oz 141 lb  Weight (kg) 61.1 kg 63.685 kg 63.957 kg      Telemetry    NSR - Personally Reviewed  ECG    NSR, LAD, LVH, persistent ST elevation V2-3. - Personally Reviewed  Physical Exam   GEN: No acute distress. Chronically ill appearing  Neck: No JVD Cardiac: RRR, no murmurs, rubs, or gallops.  Respiratory: fine bilateral crackles.  GI: Soft, nontender, non-distended  MS: No edema; No deformity. Neuro:  Nonfocal  Psych:  Normal affect   Labs    High Sensitivity Troponin:   Recent Labs  Lab 10/24/20 1545 10/25/20 0822  TROPONINIHS 916* 4,990*       Chemistry Recent Labs  Lab 10/24/20 1545 10/25/20 0822 10/26/20 0040  NA 132* 132* 129*  K 3.3* 4.1 4.1  CL 99 95* 95*  CO2 23 24 24   GLUCOSE 164* 108* 108*  BUN 11 12 12   CREATININE 0.68 0.71 0.74  CALCIUM 9.2 9.8 8.9  PROT 7.2  --   --   ALBUMIN 4.1  --   --   AST 33  --   --   ALT 18  --   --   ALKPHOS 57  --   --   BILITOT 0.5  --   --   GFRNONAA >60 >60 >60  ANIONGAP 10 13 10       Hematology Recent Labs  Lab 10/24/20 1542 10/24/20 1545  WBC  --  10.8*  RBC  --  4.46  HGB 12.2 12.1  HCT 36.0 37.3  MCV  --  83.6  MCH  --  27.1  MCHC  --  32.4  RDW  --  12.4  PLT  --  259     BNPNo results for input(s): BNP,  PROBNP in the last 168 hours.   DDimer No results for input(s): DDIMER in the last 168 hours.   Radiology    CARDIAC CATHETERIZATION  Addendum Date: 10/24/2020    Prox LAD lesion is 25% stenosed. No significant CAD.  The left ventricular systolic function is normal.  LV end diastolic pressure is mildly elevated.  The left ventricular ejection fraction is 25-35% by visual estimate.  There is no aortic valve stenosis.  Prior mitral valve surgery.  Despite lateral ST elevation, we only found nonobstructive coronary artery disease.  Left ventriculogram fits with a pattern of Takotsubo cardiomyopathy.  LV function significantly different from her prior catheterization.  She will need aggressive medical therapy for LV dysfunction. I discussed the findings with her daughter.   Result Date: 10/24/2020  Prox LAD lesion is 25% stenosed. No significant CAD.  The left ventricular systolic function is normal.  LV end diastolic pressure is mildly elevated.  The left ventricular ejection fraction is 25-35% by visual estimate.  There is no aortic valve stenosis.  Despite lateral ST elevation, we only found nonobstructive  coronary artery disease.  Left ventriculogram fits with a pattern of Takotsubo cardiomyopathy.  LV function significantly different from her prior catheterization.  She will need aggressive medical therapy for LV dysfunction. I discussed the findings with her daughter.   DG Chest Port 1 View  Result Date: 10/24/2020 CLINICAL DATA:  Chest pain EXAM: PORTABLE CHEST 1 VIEW COMPARISON:  02/24/2019 FINDINGS: Partial left lung resection with staple line again noted at the left apex. Mild bibasilar interstitial infiltrate is again seen, stable since prior examination, more severe within the left lung base in keeping with bibasilar pulmonary fibrosis. Chronic thickening of the pulmonary interstitium relates to mild underlying interstitial lung disease. No superimposed confluent pulmonary infiltrate. No pneumothorax or pleural effusion. Mitral valve replacement has been performed. Cardiac size is within normal limits. The pulmonary vascularity is normal. Remote left clavicle ORIF with instrumentation has been performed. No acute bone abnormality. IMPRESSION: No radiographic evidence of acute cardiopulmonary disease. Stable bibasilar pulmonary fibrotic change. Electronically Signed   By: Fidela Salisbury MD   On: 10/24/2020 20:55   ECHOCARDIOGRAM COMPLETE  Result Date: 10/25/2020    ECHOCARDIOGRAM REPORT   Patient Name:   Joy Williams Date of Exam: 10/25/2020 Medical Rec #:  [id number]        Height:       66.5 in Accession #:    [id number]       Weight:       140.4 lb Date of Birth:  Nov 02, 1950        BSA:          1.730 m Patient Age:    70 years         BP:           131/86 mmHg Patient Gender: F                HR:           93 bpm. Exam Location:  Inpatient Procedure: 2D Echo, Cardiac Doppler and Color Doppler Indications:    CHF  History:        Patient has prior history of Echocardiogram examinations, most                 recent 08/07/2020. CHF, Acute MI, COPD and Carotid Disease;                 Mitral Valve  Disease  and Mitral Valve Prolapse. Pulmonary                 fibrosis, lung cancer.  Sonographer:    Dustin Flock Referring Phys: [id number] Yanuel Tagg M [place] IMPRESSIONS  1. Left ventricular ejection fraction, by estimation, is 25 to 30%. The left ventricle has severely decreased function. The left ventricle demonstrates global hypokinesis. Left ventricular diastolic parameters are consistent with Grade I diastolic dysfunction (impaired relaxation).  2. Right ventricular systolic function is normal. The right ventricular size is normal. There is moderately elevated pulmonary artery systolic pressure. The estimated right ventricular systolic pressure is 81.8 mmHg.  3. Left atrial size was moderately dilated.  4. The mitral valve has been repaired/replaced. No evidence of mitral valve regurgitation. No evidence of mitral stenosis. Severe mitral annular calcification. Echo findings are consistent with normal structure and function of the mitral valve prosthesis.  5. The aortic valve is normal in structure. Aortic valve regurgitation is not visualized. No aortic stenosis is present.  6. The inferior vena cava is normal in size with greater than 50% respiratory variability, suggesting right atrial pressure of 3 mmHg. Comparison(s): Prior images unable to be directly viewed, comparison made by report only. The left ventricular function is worsened. FINDINGS  Left Ventricle: Left ventricular ejection fraction, by estimation, is 25 to 30%. The left ventricle has severely decreased function. The left ventricle demonstrates global hypokinesis. The left ventricular internal cavity size was normal in size. There is no left ventricular hypertrophy. Left ventricular diastolic parameters are consistent with Grade I diastolic dysfunction (impaired relaxation).  LV Wall Scoring: The mid and distal anterior wall, mid and distal lateral wall, mid and distal anterior septum, entire apex, mid and distal inferior wall, mid anterolateral  segment, and mid inferoseptal segment are akinetic. The basal anterior segment is normal. Right Ventricle: The right ventricular size is normal. No increase in right ventricular wall thickness. Right ventricular systolic function is normal. There is moderately elevated pulmonary artery systolic pressure. The tricuspid regurgitant velocity is 3.57 m/s, and with an assumed right atrial pressure of 3 mmHg, the estimated right ventricular systolic pressure is 29.9 mmHg. Left Atrium: Left atrial size was moderately dilated. Right Atrium: Right atrial size was normal in size. Pericardium: There is no evidence of pericardial effusion. Mitral Valve: The mitral valve has been repaired/replaced. Severe mitral annular calcification. No evidence of mitral valve regurgitation. There is a 31 mm Edwards Magna bioprosthetic valve present in the mitral position. Echo findings are consistent with normal structure and function of the mitral valve prosthesis. No evidence of mitral valve stenosis. MV peak gradient, 6.8 mmHg. The mean mitral valve gradient is 2.5 mmHg. Tricuspid Valve: The tricuspid valve is normal in structure. Tricuspid valve regurgitation is trivial. No evidence of tricuspid stenosis. Aortic Valve: The aortic valve is normal in structure. Aortic valve regurgitation is not visualized. No aortic stenosis is present. Pulmonic Valve: The pulmonic valve was normal in structure. Pulmonic valve regurgitation is not visualized. No evidence of pulmonic stenosis. Aorta: The aortic root is normal in size and structure. Venous: The inferior vena cava is normal in size with greater than 50% respiratory variability, suggesting right atrial pressure of 3 mmHg. IAS/Shunts: No atrial level shunt detected by color flow Doppler.  LEFT VENTRICLE PLAX 2D LVIDd:         3.70 cm  Diastology LVIDs:         2.80 cm  LV e' medial:    4.79 cm/s LV PW:  1.10 cm  LV E/e' medial:  20.1 LV IVS:        1.00 cm  LV e' lateral:   8.49 cm/s LVOT  diam:     2.00 cm  LV E/e' lateral: 11.4 LV SV:         55 LV SV Index:   32 LVOT Area:     3.14 cm  RIGHT VENTRICLE RV Basal diam:  3.20 cm RV S prime:     9.25 cm/s TAPSE (M-mode): 2.0 cm LEFT ATRIUM           Index       RIGHT ATRIUM           Index LA diam:      4.10 cm 2.37 cm/m  RA Area:     13.60 cm LA Vol (A2C): 80.0 ml 46.24 ml/m RA Volume:   32.10 ml  18.55 ml/m LA Vol (A4C): 77.2 ml 44.62 ml/m  AORTIC VALVE LVOT Vmax:   99.20 cm/s LVOT Vmean:  62.500 cm/s LVOT VTI:    0.175 m  AORTA Ao Root diam: 2.80 cm MITRAL VALVE               TRICUSPID VALVE MV Area (PHT): 7.59 cm    TR Peak grad:   51.0 mmHg MV Area VTI:   2.69 cm    TR Vmax:        357.00 cm/s MV Peak grad:  6.8 mmHg MV Mean grad:  2.5 mmHg    SHUNTS MV Vmax:       1.31 m/s    Systemic VTI:  0.18 m MV Vmean:      75.9 cm/s   Systemic Diam: 2.00 cm MV Decel Time: 100 msec MV E velocity: 96.40 cm/s MV A velocity: 73.70 cm/s MV E/A ratio:  1.31 Candee Furbish MD Electronically signed by Candee Furbish MD Signature Date/Time: 10/25/2020/12:03:51 PM    Final      Cardiac Studies   Echo: IMPRESSIONS     1. Left ventricular ejection fraction, by estimation, is 25 to 30%. The  left ventricle has severely decreased function. The left ventricle  demonstrates global hypokinesis. Left ventricular diastolic parameters are  consistent with Grade I diastolic  dysfunction (impaired relaxation).   2. Right ventricular systolic function is normal. The right ventricular  size is normal. There is moderately elevated pulmonary artery systolic  pressure. The estimated right ventricular systolic pressure is 24.2 mmHg.   3. Left atrial size was moderately dilated.   4. The mitral valve has been repaired/replaced. No evidence of mitral  valve regurgitation. No evidence of mitral stenosis. Severe mitral annular  calcification. Echo findings are consistent with normal structure and  function of the mitral valve  prosthesis.   5. The aortic valve is  normal in structure. Aortic valve regurgitation is  not visualized. No aortic stenosis is present.   6. The inferior vena cava is normal in size with greater than 50%  respiratory variability, suggesting right atrial pressure of 3 mmHg.   Comparison(s): Prior images unable to be directly viewed, comparison made  by report only. The left ventricular function is worsened.   Patient Profile     70 y.o. 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  Assessment & Plan  1. Stress induced cardiomyopathy/Takotsubo's syndrome. Acute systolic CHF. No clear stressors. Normal coronary anatomy at cath. EF 25-30% by Echo. No LV thrombus. Initial Ecg showed lateral ST elevation. Repeat Ecg shows LVH with persistent ST elevation V2-3. Troponin 916>> 4990.  No symptoms of chest pain or increased SOB. Supportive care with Plavix, Toprol, ARB. Low BP limits titration. Patient is stable for transfer to telemetry. Ambulate with cardiac Rehab. 2. Nausea/vomiting. Resolved.  3. Hypokalemia- repleted. 4. S/p MV replacement with bioprosthetic valve- functioning well by Echo. 5. S/p right  CEA in March. 6. NSCLC  7. Scleroderma.  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 Hypothyroid. TSH normal.  For questions or updates, please contact [place] Please consult [email or web address] for contact info under        Signed, Arel Tippen [place], MD  10/26/2020, 7:30 AM

## 2020-10-26 NOTE — Progress Notes (Signed)
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 [facility] (had to have botox injection)   COPD (chronic obstructive pulmonary disease) ([place])    Family history of adverse reaction to anesthesia    Mother- very sensitive to medication   Heart murmur    MVP   Hypothyroidism    Lung cancer ([place]) 12/30/2014   Synchronous primaries:  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, each treated by wedge resection with post-op adjuvant chemoRx at [facility]   MVP (mitral valve prolapse) 2018   s/p Minimally-Invasive Mitral Valve Replacement w/  [facility] Mitral bovine bioprosthetic tissue valve (size 56mm, model # 7300TFX, serial # [id number])   PONV (postoperative nausea and vomiting)    Raynaud's syndrome    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   31 mm [facility] mitral bovine bioprosthetic tissue valve placed via right mini thoracotomy approach   Severe mitral regurgitation 11/15/2014   Shortness of breath dyspnea    with exertion   STD (sexually transmitted disease)    STEMI (ST elevation myocardial infarction) ([place]) 10/24/2020   Telangiectasia     Social History   Socioeconomic History   Marital status: Divorced    Spouse name: Not on file   Number of children: Not on file   Years of education: 16   Highest education level: Not on file  Occupational History   Occupation: Retired  Tobacco Use   Smoking status: Some Days    Packs/day: 1.00    Years: 24.00    Pack years: 24.00    Types: E-cigarettes, Cigarettes    Start date:  05/21/1983   Smokeless tobacco: Never   Tobacco comments:    currently smoking 3cigs per day as of 10/12/20  Vaping Use   Vaping Use: Former   Quit date: 05/20/2006  Substance and Sexual Activity   Alcohol use: Yes    Alcohol/week: 2.0 standard drinks    Types: 1 Shots of liquor, 1 Standard drinks or equivalent per week   Drug use: No   Sexual activity: Never    Partners: Male    Birth control/protection: Post-menopausal  Other Topics Concern   Not on file  Social History Narrative   Not on file   Social Determinants of Health   Financial Resource Strain: Low Risk    Difficulty of Paying Living Expenses: Not very hard  Food Insecurity: No Food Insecurity   Worried About Charity fundraiser in the Last Year: Never true   Arboriculturist in the Last Year: Never true  Transportation Needs: Unmet Transportation Needs   Lack of Transportation (Medical): No   Lack of Transportation (Non-Medical): Yes  Physical Activity: Not on file  Stress: Not on file  Social Connections: Not on file     Heart & Vascular Transition of Care Clinic follow-up: Scheduled for 11/06/20 at 1:00pm.  Confirmed patient enrolled in [employer] transportation.  Immediate social needs: [employer] transportation  [employer], MSW, [place] Heart Failure Social Worker

## 2020-10-27 DIAGNOSIS — C3490 Malignant neoplasm of unspecified part of unspecified bronchus or lung: Secondary | ICD-10-CM

## 2020-10-27 MED ORDER — SODIUM CHLORIDE 0.9 % IV SOLN
510.0000 mg | INTRAVENOUS | Status: DC
  Administered 2020-10-27: 510 mg via INTRAVENOUS
  Filled 2020-10-27: qty 17

## 2020-10-27 MED ORDER — SODIUM CHLORIDE 0.9 % IV SOLN: 510.0000 mg | INTRAVENOUS | Status: DC

## 2020-10-27 MED ORDER — SODIUM CHLORIDE 0.9 % IV SOLN
510.0000 mg | INTRAVENOUS | Status: DC
  Filled 2020-10-27: qty 17

## 2020-10-27 MED ORDER — EMPAGLIFLOZIN 10 MG PO TABS
10.0000 mg | ORAL_TABLET | Freq: Every day | ORAL | Status: DC
  Administered 2020-10-27 – 2020-10-29 (×3): 10 mg via ORAL
  Filled 2020-10-27 (×3): qty 1

## 2020-10-27 NOTE — Progress Notes (Signed)
Mobility Specialist - Progress Note   10/27/20 1103  Mobility  Activity Ambulated in hall  Level of Assistance Contact guard assist, steadying assist  Assistive Device None  Distance Ambulated (ft) 400 ft  Mobility Ambulated with assistance in hallway  Mobility Response Tolerated well  Mobility performed by Mobility specialist  $Mobility charge 1 Mobility   Pre-mobility: 70 HR, 104/62 BP Post-mobility: 81 HR, 107/60 BP  Pt required one standing rest break due to unsteadiness. She lost balance laterally 2x during ambulation and required some assist w/ gait belt to steady herself. Pt to recliner after walk, call bell at side.   Pricilla Handler Mobility Specialist Mobility Specialist Phone: [phone]

## 2020-10-27 NOTE — Progress Notes (Signed)
Heart Failure Stewardship Pharmacist Progress Note   PCP: Lorrene Reid, PA-C PCP-Cardiologist: Freada Bergeron, MD    HPI:  44 yoF w/ PMH of D-CHF, NSCLC, MVR (replacement w/ bioprosthetic) who presented w/ CP and N/V on 6/7, evaluated for STEMI. 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 ECHO revealed EF 25-30%.   Current HF Medications: - Jardiance 10mg  daily - Feraheme 510mg  IV Q72h  Prior to admission HF Medications: - Metoprolol succinate 12.5mg  daily  Pertinent Lab Values: From 6/9 - Serum creatinine 0.74, BUN 12, Potassium 4.1, Sodium 129, Magnesium 1.9  Vital Signs: Weight: 134 lbs (admission weight: 140 lbs) Blood pressure: 90s/50s - dropped into 25-95G systolic this AM Heart rate: 60s  Medication Assistance / Insurance Benefits Check: Does the patient have prescription insurance?  Yes Type of insurance plan: UHC Medicare Supplement  Does the patient qualify for medication assistance through manufacturers or grants?   Pending income information  Eligible grants and/or patient assistance programs: pending Medication assistance applications in progress: N/A Medication assistance applications approved: N/A Approved medication assistance renewals will be completed by: pending  Outpatient Pharmacy:  Prior to admission [facility] Is the patient willing to use [place] at discharge? Yes Is the patient willing to transition their outpatient pharmacy to utilize a [facility] outpatient pharmacy?   Pending    Assessment: 1. Acute on chronic systolic CHF (EF 38-75%), due to NICM (Stress-induced Takotsubo CM). NYHA class II symptoms. - Discontinued metoprolol and losartan for now w/ BP drop - Added Jardiance 10mg  daily - Iron panel low - TSAT 10, Ferritin 37. Added Feraheme 510 mg IV Q72h - Consider restarting ARB/BB if BP stables out - Consider spironolactone prior to discharge if BP allows   Plan: 1) Medication  changes recommended at this time: - Add Jardiance 10 mg daily  - Add Feraheme 510mg  IV Q72h - D/C losartan and metoprolol for now  2) Patient assistance: - Jardiance co-pay $47 Delene Loll co-pay $75 - Wilder Glade is not covered under patient's formulary - Appreciate assistance from Willette Alma, CPhT  3)  Education  - To be completed prior to discharge - HF TOC appt 6/20    Michela Pitcher) [place], PharmD Student

## 2020-10-27 NOTE — Progress Notes (Addendum)
Pt ambulated earlier with MT however now c/o nausea and pain in right neck, declined ambulation. BP 91/52. Discussed walking later with staff. Discussed restrictions at home, increasing exercise, low sodium diet, daily wts, signs of fluid, smoking cessation, and CRPII. Gave her HF booklet. Pt receptive. Will refer to [place]. She could do pulmonary rehab after cardiac. [place] CES, ACSM 2:40 PM 10/27/2020

## 2020-10-27 NOTE — Care Management Important Message (Signed)
Important Message  Patient Details  Name: Joy Williams MRN: [id number] Date of Birth: 1950-11-25   Medicare Important Message Given:  Yes     Braelin Brosch 10/27/2020, 11:19 AM

## 2020-10-27 NOTE — Progress Notes (Signed)
Progress Note  Patient Name: Joy Williams Date of Encounter: 10/27/2020  [facility] Cardiologist: Freada Bergeron, MD   Subjective   Denies any chest pain or SOB. Feels weak and lightheaded with ambulation.  Inpatient Medications    Scheduled Meds:  Chlorhexidine Gluconate Cloth  6 each Topical Daily   clopidogrel  75 mg Oral Daily   empagliflozin  10 mg Oral Daily   fluticasone furoate-vilanterol  1 puff Inhalation Daily   And   umeclidinium bromide  1 puff Inhalation Daily   levothyroxine  175 mcg Oral Q0600   nicotine  7 mg Transdermal Daily   rosuvastatin  20 mg Oral Daily   sodium chloride flush  10-40 mL Intracatheter Q12H   Continuous Infusions:  sodium chloride     ferumoxytol     promethazine (PHENERGAN) injection (IM or IVPB) Stopped (10/26/20 0057)   PRN Meds: sodium chloride, acetaminophen, acetaminophen, azelastine, nitroGLYCERIN, ondansetron (ZOFRAN) IV, promethazine (PHENERGAN) injection (IM or IVPB), sodium chloride flush   Vital Signs    Vitals:   10/26/20 2314 10/26/20 2335 10/27/20 0300 10/27/20 0800  BP:  (!) 101/57 (!) 98/52 (!) 98/52  Pulse:  72 71 68  Resp:  16 20 19   Temp:  97.9 F (36.6 C) 98.1 F (36.7 C) 98 F (36.7 C)  TempSrc:  Oral Oral Oral  SpO2:  95% 94% 96%  Weight:      Height: 5' 6.5" (1.689 m)       Intake/Output Summary (Last 24 hours) at 10/27/2020 0920 Last data filed at 10/27/2020 8527 Gross per 24 hour  Intake 610 ml  Output 400 ml  Net 210 ml    Last 3 Weights 10/26/2020 10/12/2020 09/12/2020  Weight (lbs) 134 lb 11.2 oz 140 lb 6.4 oz 141 lb  Weight (kg) 61.1 kg 63.685 kg 63.957 kg      Telemetry    NSR - Personally Reviewed  ECG    NSR, LAD, LVH, persistent ST elevation V2-3. - Personally Reviewed  Physical Exam   GEN: No acute distress. Chronically ill appearing  Neck: No JVD Cardiac: RRR, no murmurs, rubs, or gallops.  Respiratory: fine bilateral crackles.  GI: Soft, nontender,  non-distended  MS: No edema; No deformity. Neuro:  Nonfocal  Psych: Normal affect   Labs    High Sensitivity Troponin:   Recent Labs  Lab 10/24/20 1545 10/25/20 0822  TROPONINIHS 916* 4,990*       Chemistry Recent Labs  Lab 10/24/20 1545 10/25/20 0822 10/26/20 0040  NA 132* 132* 129*  K 3.3* 4.1 4.1  CL 99 95* 95*  CO2 23 24 24   GLUCOSE 164* 108* 108*  BUN 11 12 12   CREATININE 0.68 0.71 0.74  CALCIUM 9.2 9.8 8.9  PROT 7.2  --   --   ALBUMIN 4.1  --   --   AST 33  --   --   ALT 18  --   --   ALKPHOS 57  --   --   BILITOT 0.5  --   --   GFRNONAA >60 >60 >60  ANIONGAP 10 13 10       Hematology Recent Labs  Lab 10/24/20 1542 10/24/20 1545  WBC  --  10.8*  RBC  --  4.46  HGB 12.2 12.1  HCT 36.0 37.3  MCV  --  83.6  MCH  --  27.1  MCHC  --  32.4  RDW  --  12.4  PLT  --  259     BNPNo results for input(s): BNP, PROBNP in the last 168 hours.   DDimer No results for input(s): DDIMER in the last 168 hours.   Radiology    ECHOCARDIOGRAM COMPLETE  Result Date: 10/25/2020    ECHOCARDIOGRAM REPORT   Patient Name:   Joy Williams Date of Exam: 10/25/2020 Medical Rec #:  [id number]        Height:       66.5 in Accession #:    [id number]       Weight:       140.4 lb Date of Birth:  08/10/1950        BSA:          1.730 m Patient Age:    70 years         BP:           131/86 mmHg Patient Gender: F                HR:           93 bpm. Exam Location:  Inpatient Procedure: 2D Echo, Cardiac Doppler and Color Doppler Indications:    CHF  History:        Patient has prior history of Echocardiogram examinations, most                 recent 08/07/2020. CHF, Acute MI, COPD and Carotid Disease;                 Mitral Valve Disease and Mitral Valve Prolapse. Pulmonary                 fibrosis, lung cancer.  Sonographer:    Dustin Flock Referring Phys: [id number] Mellie Buccellato M [place] IMPRESSIONS  1. Left ventricular ejection fraction, by estimation, is 25 to 30%. The left ventricle has  severely decreased function. The left ventricle demonstrates global hypokinesis. Left ventricular diastolic parameters are consistent with Grade I diastolic dysfunction (impaired relaxation).  2. Right ventricular systolic function is normal. The right ventricular size is normal. There is moderately elevated pulmonary artery systolic pressure. The estimated right ventricular systolic pressure is 76.1 mmHg.  3. Left atrial size was moderately dilated.  4. The mitral valve has been repaired/replaced. No evidence of mitral valve regurgitation. No evidence of mitral stenosis. Severe mitral annular calcification. Echo findings are consistent with normal structure and function of the mitral valve prosthesis.  5. The aortic valve is normal in structure. Aortic valve regurgitation is not visualized. No aortic stenosis is present.  6. The inferior vena cava is normal in size with greater than 50% respiratory variability, suggesting right atrial pressure of 3 mmHg. Comparison(s): Prior images unable to be directly viewed, comparison made by report only. The left ventricular function is worsened. FINDINGS  Left Ventricle: Left ventricular ejection fraction, by estimation, is 25 to 30%. The left ventricle has severely decreased function. The left ventricle demonstrates global hypokinesis. The left ventricular internal cavity size was normal in size. There is no left ventricular hypertrophy. Left ventricular diastolic parameters are consistent with Grade I diastolic dysfunction (impaired relaxation).  LV Wall Scoring: The mid and distal anterior wall, mid and distal lateral wall, mid and distal anterior septum, entire apex, mid and distal inferior wall, mid anterolateral segment, and mid inferoseptal segment are akinetic. The basal anterior segment is normal. Right Ventricle: The right ventricular size is normal. No increase in right ventricular wall thickness. Right ventricular systolic function is normal. There is moderately  elevated pulmonary artery systolic pressure. The tricuspid regurgitant velocity is 3.57 m/s, and with an assumed right atrial pressure of 3 mmHg, the estimated right ventricular systolic pressure is 85.4 mmHg. Left Atrium: Left atrial size was moderately dilated. Right Atrium: Right atrial size was normal in size. Pericardium: There is no evidence of pericardial effusion. Mitral Valve: The mitral valve has been repaired/replaced. Severe mitral annular calcification. No evidence of mitral valve regurgitation. There is a 31 mm Edwards Magna bioprosthetic valve present in the mitral position. Echo findings are consistent with normal structure and function of the mitral valve prosthesis. No evidence of mitral valve stenosis. MV peak gradient, 6.8 mmHg. The mean mitral valve gradient is 2.5 mmHg. Tricuspid Valve: The tricuspid valve is normal in structure. Tricuspid valve regurgitation is trivial. No evidence of tricuspid stenosis. Aortic Valve: The aortic valve is normal in structure. Aortic valve regurgitation is not visualized. No aortic stenosis is present. Pulmonic Valve: The pulmonic valve was normal in structure. Pulmonic valve regurgitation is not visualized. No evidence of pulmonic stenosis. Aorta: The aortic root is normal in size and structure. Venous: The inferior vena cava is normal in size with greater than 50% respiratory variability, suggesting right atrial pressure of 3 mmHg. IAS/Shunts: No atrial level shunt detected by color flow Doppler.  LEFT VENTRICLE PLAX 2D LVIDd:         3.70 cm  Diastology LVIDs:         2.80 cm  LV e' medial:    4.79 cm/s LV PW:         1.10 cm  LV E/e' medial:  20.1 LV IVS:        1.00 cm  LV e' lateral:   8.49 cm/s LVOT diam:     2.00 cm  LV E/e' lateral: 11.4 LV SV:         55 LV SV Index:   32 LVOT Area:     3.14 cm  RIGHT VENTRICLE RV Basal diam:  3.20 cm RV S prime:     9.25 cm/s TAPSE (M-mode): 2.0 cm LEFT ATRIUM           Index       RIGHT ATRIUM           Index LA  diam:      4.10 cm 2.37 cm/m  RA Area:     13.60 cm LA Vol (A2C): 80.0 ml 46.24 ml/m RA Volume:   32.10 ml  18.55 ml/m LA Vol (A4C): 77.2 ml 44.62 ml/m  AORTIC VALVE LVOT Vmax:   99.20 cm/s LVOT Vmean:  62.500 cm/s LVOT VTI:    0.175 m  AORTA Ao Root diam: 2.80 cm MITRAL VALVE               TRICUSPID VALVE MV Area (PHT): 7.59 cm    TR Peak grad:   51.0 mmHg MV Area VTI:   2.69 cm    TR Vmax:        357.00 cm/s MV Peak grad:  6.8 mmHg MV Mean grad:  2.5 mmHg    SHUNTS MV Vmax:       1.31 m/s    Systemic VTI:  0.18 m MV Vmean:      75.9 cm/s   Systemic Diam: 2.00 cm MV Decel Time: 100 msec MV E velocity: 96.40 cm/s MV A velocity: 73.70 cm/s MV E/A ratio:  1.31 Candee Furbish MD Electronically signed by Candee Furbish MD Signature Date/Time: 10/25/2020/12:03:51 PM  Final      Cardiac Studies   Echo: IMPRESSIONS     1. Left ventricular ejection fraction, by estimation, is 25 to 30%. The  left ventricle has severely decreased function. The left ventricle  demonstrates global hypokinesis. Left ventricular diastolic parameters are  consistent with Grade I diastolic  dysfunction (impaired relaxation).   2. Right ventricular systolic function is normal. The right ventricular  size is normal. There is moderately elevated pulmonary artery systolic  pressure. The estimated right ventricular systolic pressure is 16.1 mmHg.   3. Left atrial size was moderately dilated.   4. The mitral valve has been repaired/replaced. No evidence of mitral  valve regurgitation. No evidence of mitral stenosis. Severe mitral annular  calcification. Echo findings are consistent with normal structure and  function of the mitral valve  prosthesis.   5. The aortic valve is normal in structure. Aortic valve regurgitation is  not visualized. No aortic stenosis is present.   6. The inferior vena cava is normal in size with greater than 50%  respiratory variability, suggesting right atrial pressure of 3 mmHg.   Comparison(s):  Prior images unable to be directly viewed, comparison made  by report only. The left ventricular function is worsened.   Patient Profile     70 y.o. 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  Assessment & Plan    1. Stress induced cardiomyopathy/Takotsubo's syndrome. Acute systolic CHF. No clear stressors. Normal coronary anatomy at cath. EF 25-30% by Echo. No LV thrombus. Initial Ecg showed lateral ST elevation. Repeat Ecg shows LVH with persistent ST elevation V2-3. Troponin 916>> 4990.  No symptoms of chest pain or increased SOB. Supportive care with Plavix. Significant hypotension. Will hold low dose Toprol and losartan. Low BP limits titration. Will add SGLT 2 inhibitor- Jardiance due to formulary coverage.  Observe BP response today.  2. Nausea/vomiting. Resolved.  3. Hypokalemia- repleted. 4. S/p MV replacement with bioprosthetic valve- functioning well by Echo. 5. S/p right  CEA in March. 6. NSCLC  7. Scleroderma.  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 Hypothyroid. TSH normal. 10. Iron deficiency - low ferritin. Will give Ferraheme IV.   For questions or updates, please contact [place] Please consult [email or web address] for contact info under        Signed, Riyah Bardon [place], MD  10/27/2020, 9:20 AM

## 2020-10-28 LAB — BASIC METABOLIC PANEL
Anion gap: 8 (ref 5–15)
BUN: 13 mg/dL (ref 8–23)
CO2: 25 mmol/L (ref 22–32)
Calcium: 8.6 mg/dL — ABNORMAL LOW (ref 8.9–10.3)
Chloride: 96 mmol/L — ABNORMAL LOW (ref 98–111)
Creatinine, Ser: 0.65 mg/dL (ref 0.44–1.00)
GFR, Estimated: >60 mL/min (ref >60)
Glucose, Bld: 100 mg/dL — ABNORMAL HIGH (ref 70–99)
Potassium: 3.6 mmol/L (ref 3.5–5.1)
Sodium: 129 mmol/L — ABNORMAL LOW (ref 135–145)

## 2020-10-28 MED ORDER — MAGNESIUM HYDROXIDE 400 MG/5ML PO SUSP
15.0000 mL | Freq: Every day | ORAL | Status: DC | PRN
  Administered 2020-10-28: 15 mL via ORAL
  Filled 2020-10-28: qty 30

## 2020-10-28 NOTE — Plan of Care (Signed)

## 2020-10-28 NOTE — Progress Notes (Signed)
Progress Note   Subjective   Doing well today, the patient denies CP or SOB.  No new concerns  Inpatient Medications    Scheduled Meds:  Chlorhexidine Gluconate Cloth  6 each Topical Daily   clopidogrel  75 mg Oral Daily   empagliflozin  10 mg Oral Daily   fluticasone furoate-vilanterol  1 puff Inhalation Daily   And   umeclidinium bromide  1 puff Inhalation Daily   levothyroxine  175 mcg Oral Q0600   nicotine  7 mg Transdermal Daily   rosuvastatin  20 mg Oral Daily   sodium chloride flush  10-40 mL Intracatheter Q12H   Continuous Infusions:  sodium chloride     ferumoxytol 510 mg (10/27/20 1221)   promethazine (PHENERGAN) injection (IM or IVPB) Stopped (10/26/20 0057)   PRN Meds: sodium chloride, acetaminophen, acetaminophen, azelastine, magnesium hydroxide, nitroGLYCERIN, ondansetron (ZOFRAN) IV, promethazine (PHENERGAN) injection (IM or IVPB), sodium chloride flush   Vital Signs    Vitals:   10/28/20 0300 10/28/20 0727 10/28/20 0900 10/28/20 1124  BP: (!) 93/49 106/65  117/60  Pulse: 65 75 78 73  Resp: (!) 21 16 16 18   Temp: 97.9 F (36.6 C) 98 F (36.7 C)  97.8 F (36.6 C)  TempSrc: Oral Oral  Oral  SpO2: 95% 97%  98%  Weight:      Height:        Intake/Output Summary (Last 24 hours) at 10/28/2020 1137 Last data filed at 10/28/2020 1131 Gross per 24 hour  Intake 1350 ml  Output --  Net 1350 ml   Filed Weights   10/26/20 0600  Weight: 61.1 kg    Telemetry    sinus - Personally Reviewed  Physical Exam   GEN- The patient is well appearing, alert and oriented x 3 today.   Head- normocephalic, atraumatic Eyes-  Sclera clear, conjunctiva pink Ears- hearing intact Oropharynx- clear Neck- supple, Lungs-  normal work of breathing Heart- Regular rate and rhythm  GI- soft  Extremities- no clubbing, cyanosis, or edema  MS- no significant deformity or atrophy Skin- no rash or lesion Psych- euthymic mood, full affect Neuro- strength and sensation  are intact   Labs    Chemistry Recent Labs  Lab 10/24/20 1545 10/25/20 0822 10/26/20 0040  NA 132* 132* 129*  K 3.3* 4.1 4.1  CL 99 95* 95*  CO2 23 24 24   GLUCOSE 164* 108* 108*  BUN 11 12 12   CREATININE 0.68 0.71 0.74  CALCIUM 9.2 9.8 8.9  PROT 7.2  --   --   ALBUMIN 4.1  --   --   AST 33  --   --   ALT 18  --   --   ALKPHOS 57  --   --   BILITOT 0.5  --   --   GFRNONAA >60 >60 >60  ANIONGAP 10 13 10      Hematology Recent Labs  Lab 10/24/20 1542 10/24/20 1545  WBC  --  10.8*  RBC  --  4.46  HGB 12.2 12.1  HCT 36.0 37.3  MCV  --  83.6  MCH  --  27.1  MCHC  --  32.4  RDW  --  12.4  PLT  --  259     Patient ID   70 y.o. 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  Assessment & Plan    1.  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 S/p MV replacement with bioprosthetic valve Stable No change required today  3. Scleroderma/ UIP Stable No change required today  Hopefully home tomorrow  Thompson Grayer MD, [facility] 10/28/2020 11:37 AM

## 2020-10-28 NOTE — Progress Notes (Addendum)
Pt had extensive list of questions about her stay. Questions and answers are paraphrased. Patient requested that this information be shared with her and her family via her personal phone and face time. Spent approximately 15 minutes during encounter.    Q: What  is my diagnosis?  A: Takotsudo cardiomyopathy. This nurse used the explanation of over inflating a balloon, letting some air out (to help patient visualize the loss of elasticity), and how the balloon never truly springs back to original smooth shape.   Q:How did the doctors come to this diagnosis? A: Presentation of the heart on your ECHO and Heart cath appears to have led your providers to the diagnosis. We should confirm with them on rounds. Changes were noted from your previous 2020 heart cath. While we can't undo the damage, we can try to prevent further damage medicinally.  Q: Why am I being put on Jardiance? A: Provided patient with the following information from the link below about FDA approval of Jardiance in HF patients. Brief explanation that HF is a spectrum ranging from normal function to no function and that while aggressive medicinal management is what her care team is suggesting, the take away is that this is currently classified as manageable heart failure. Discussed side effects of med, why it effects her BP, and importance of reduced sodium diet and increased H2O intake.   https://www.bo[employer]ringer-[employer]el[employer].us/press-release/us-fda-approves-jardiance-empagliflozin-treat-adults-living-heart-failure-reduced    Follow up questions after the above discussion: Q: What can I do to prevent exacerbation of heart failure? A: Provided information on the DASH diet, medication compliance, and keeping follow up appointments.   Questions that this nurse indicated were outside of my scope of knowledge and therefore encouraged the patient to ask her physicians when they round: Q: What changed from my 2020 hearth cath to now that caused  this? What did I do to cause this? Q: Did my mitral valve issues cause this? Q: Will I be on all of these medications when I leave, and if yes, for how long?  Patient expressed that she felt better informed on what items she needs to research and what questions to ask her care team in rounds.   Will provide patient education from http://www.wilson-mendoza.org/ on DASH diet.

## 2020-10-28 NOTE — Progress Notes (Signed)
Pt states that her son is bringing her lunch from [employer] and if "fried chicken is okay if I don't eat the skin?" This nurse educated patient on avoiding fried foods, avoiding heavily salted foods and/or adding additional salt. This nurse suggest baked chicken instead. Pt expressed understanding and that she would ask him to bring something else that was not fried.

## 2020-10-28 NOTE — Progress Notes (Signed)
CARDIAC REHAB PHASE I   PRE:  Rate/Rhythm: 70 SR  BP:  Supine:   Sitting: 94/56     SaO2: 99% RA  MODE:  Ambulation: 340 ft   POST:  Rate/Rhythm: 77 SR  BP:  Supine:   Sitting: 109/61    SaO2: 98% RA  Pt was seated on EOB. RN was by bedside and had just checked BP and it was a little low. Had pt stand up and re-checked BP and it was the same and she stated she felt okay. Pt ambulated 340ft using RW and tolerated well with no concerns. She was steady using walker and had no balance issues. Returned pt seated in recliner with call bell in reach. BP was normal after ambulation and she stated she felt good after walk. Encouraged another walk today, but needs to be with someone and using the RW. Pt is having family check to see what type of walker she has at home for future use.  [phone]  Rick Duff, MS, ACSM-CEP

## 2020-10-28 NOTE — Plan of Care (Signed)

## 2020-10-29 MED ORDER — EMPAGLIFLOZIN 10 MG PO TABS: 10.0000 mg | ORAL_TABLET | Freq: Every day | ORAL | 6 refills | Status: DC

## 2020-10-29 MED ORDER — FERROUS SULFATE 325 (65 FE) MG PO TABS: 325.0000 mg | ORAL_TABLET | Freq: Every day | ORAL | 6 refills | Status: DC

## 2020-10-29 MED ORDER — CLOPIDOGREL BISULFATE 75 MG PO TABS: 75.0000 mg | ORAL_TABLET | Freq: Every day | ORAL | 3 refills | Status: DC

## 2020-10-29 MED ORDER — NICOTINE 7 MG/24HR TD PT24: 7.0000 mg | MEDICATED_PATCH | Freq: Every day | TRANSDERMAL | 0 refills | Status: DC

## 2020-10-29 NOTE — Discharge Summary (Signed)
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 No significant CAD. The left ventricular systolic function is normal. LV end diastolic pressure is mildly elevated. The left ventricular ejection fraction is 25-35% by visual estimate. There is no aortic valve stenosis. Prior mitral valve surgery.   Despite lateral ST elevation, we only found nonobstructive coronary artery disease.  Left ventriculogram fits with a pattern of Takotsubo cardiomyopathy.  LV function significantly different from her prior catheterization.  She will need aggressive medical therapy for LV dysfunction.   I discussed the findings with her daughter.    Echocardiogram 10/25/20: 1. Left ventricular ejection fraction, by estimation, is 25 to 30%. The  left ventricle has severely decreased function. The left ventricle  demonstrates global hypokinesis. Left ventricular diastolic parameters are  consistent with Grade I diastolic  dysfunction (impaired relaxation).   2. Right ventricular systolic function is normal. The right ventricular  size is normal. There is moderately elevated pulmonary artery systolic  pressure. The estimated right ventricular systolic pressure is 23.5 mmHg.   3. Left atrial size was moderately dilated.   4. The mitral valve has been repaired/replaced. No evidence of mitral  valve  regurgitation. No evidence of mitral stenosis. Severe mitral annular  calcification. Echo findings are consistent with normal structure and  function of the mitral valve  prosthesis.   5. The aortic valve is normal in structure. Aortic valve regurgitation is  not visualized. No aortic stenosis is present.   6. The inferior vena cava is normal in size with greater than 50%  respiratory variability, suggesting right atrial pressure of 3 mmHg.   Comparison(s): Prior images unable to be directly viewed, comparison made  by report only. The left ventricular function is worsened.  _____________   History of Present Illness     Joy Williams is a 70 y.o. female with a PMH of MVP s/p minimally invasive bioprosthetic MVR, carotid artery disease s/p R CEA, chronic diastolic CHF, scleroderma, cold agglutinin disease, raynaud's disease, UIP, NSCLC, COPD, and hypothyroidism, who presented with chest pain, nausea, and vomiting.   She was in her usual state of health until 10/24/20.   She had sudden onset of substernal chest pain, associated with nausea and some vomiting.   EMS was called.  Her ECG had ST elevation in lead I and aVL, consistent with an anterior STEMI.   She was transported emergently to the hospital.  In route, she was given ASA 324 mg, Zofran 4 mg, and sublingual nitroglycerin x1.   She has never had symptoms like this before.   On arrival to the hospital, she was still having pain at a 5/10 and her ECG was still significantly abnormal.  She was taken emergently to the Cath Lab.  Hospital Course     Consultants: None   STEMI 2/2 Takotsubo cardiomyopathy: patient presented with chest pain,  nausea, and vomiting 10/24/20 and found to have anterolateral STE on EKG. HsTrop increased from 916>4990. She underwent emergent cardiac catheterization which showed 25% pLAD stenosis, otherwise normal coronary arteries, and EF 25-35% with LV gram c/w takotsubo cardiomyopathy. She was started on  plavix for conservative management. Echocardiogram showed EF 25-30%, global hypokinesis, G1DD, moderately elevated PA pressures, moderate LAE, and normal structure/function of mitral valve s/p repair/replacement. GDMT has been limited by hypotension - she failed trial of metoprolol succinate and losartan. Subsequently started on jardiance.  - Continue plavix - Continue jardiance - Continue to limit salt intake and monitor daily weights - Patient instructed to monitor BP BID and bring log to follow-up visit to help determine if additional GDMT can be started in the outpatient setting.  - Will need repeat echocardiogram in 3 months to evaluate for improvement in LV function.  - Scheduled for hospital follow-up in the [facility] 11/06/20  MVP c/b severe mitral regurgitation s/p bioprosthetic MVR in 2018: valve appeared stable on echo this admission - Continue routine monitoring   Carotid artery disease s/p right CEA 07/2020: followed by [name] outpatient - Continue routine monitoring  Iron deficiency: patient found to have low ferritin levels this admission. Given feraheme x1 dose and transitioned to po iron supplement at discharge - Continue po iron - Follow-up with PCP for further management.    UIP: follows outpatient with pulmonology. Does have some crackles on exam though no complaints of SOB.  - Continue home trelegy  - Continue to follow with pulmonology    Did the patient have an acute coronary syndrome (MI, NSTEMI, STEMI, etc) this admission?:  Yes                               AHA/ACC Clinical Performance & Quality Measures: Aspirin prescribed? - No - mild disease, takotsubo cardiomyopathy ADP Receptor Inhibitor (Plavix/Clopidogrel, Brilinta/Ticagrelor or Effient/Prasugrel) prescribed (includes medically managed patients)? - Yes Beta Blocker prescribed? - No - hypotension limits GDMT High Intensity Statin (Lipitor 40-80mg  or Crestor 20-40mg ) prescribed? - Yes EF  assessed during THIS hospitalization? - Yes For EF <40%, was ACEI/ARB prescribed? - No - Reason:  hypotension limits GDMT For EF <40%, Aldosterone Antagonist (Spironolactone or Eplerenone) prescribed? - No - Reason:  hypotension limits GDMT Cardiac Rehab Phase II ordered (including medically managed patients)? - Yes      _____________  Discharge Vitals Blood pressure (!) 100/53, pulse 77, temperature (!) 97.5 F (36.4 C), temperature source Oral, resp. rate 19, height 5' 6.5" (1.689 m), weight 61.1 kg, SpO2 97 %.  Filed Weights   10/26/20 0600  Weight: 61.1 kg   Physical exam on the day of discharge: GEN: No acute distress.   Neck: No JVD, no carotid bruits Cardiac: RRR, no murmurs, rubs, or gallops.  Respiratory: faint crackles at lung bases, otherwise no wheezes/ rhonchi GI: NABS, Soft, nontender, non-distended  MS: No edema; No deformity. Neuro:  Nonfocal, moving all extremities spontaneously Psych: Normal affect     Labs & Radiologic Studies    CBC No results for input(s): WBC, NEUTROABS, HGB, HCT, MCV, PLT in the last 72 hours. Basic Metabolic Panel Recent Labs    10/28/20 2147  NA 129*  K 3.6  CL 96*  CO2 25  GLUCOSE 100*  BUN 13  CREATININE 0.65  CALCIUM 8.6*   Liver Function Tests No results for input(s): AST, ALT, ALKPHOS, BILITOT, PROT, ALBUMIN in  the last 72 hours. No results for input(s): LIPASE, AMYLASE in the last 72 hours. High Sensitivity Troponin:   Recent Labs  Lab 10/24/20 1545 10/25/20 0822  TROPONINIHS 916* 4,990*    BNP Invalid input(s): POCBNP D-Dimer No results for input(s): DDIMER in the last 72 hours. Hemoglobin A1C No results for input(s): HGBA1C in the last 72 hours. Fasting Lipid Panel No results for input(s): CHOL, HDL, LDLCALC, TRIG, CHOLHDL, LDLDIRECT in the last 72 hours. Thyroid Function Tests No results for input(s): TSH, T4TOTAL, T3FREE, THYROIDAB in the last 72 hours.  Invalid input(s): FREET3 _____________   CARDIAC CATHETERIZATION  Addendum Date: 10/24/2020    Prox LAD lesion is 25% stenosed. No significant CAD.  The left ventricular systolic function is normal.  LV end diastolic pressure is mildly elevated.  The left ventricular ejection fraction is 25-35% by visual estimate.  There is no aortic valve stenosis.  Prior mitral valve surgery.  Despite lateral ST elevation, we only found nonobstructive coronary artery disease.  Left ventriculogram fits with a pattern of Takotsubo cardiomyopathy.  LV function significantly different from her prior catheterization.  She will need aggressive medical therapy for LV dysfunction. I discussed the findings with her daughter.   Result Date: 10/24/2020  Prox LAD lesion is 25% stenosed. No significant CAD.  The left ventricular systolic function is normal.  LV end diastolic pressure is mildly elevated.  The left ventricular ejection fraction is 25-35% by visual estimate.  There is no aortic valve stenosis.  Despite lateral ST elevation, we only found nonobstructive coronary artery disease.  Left ventriculogram fits with a pattern of Takotsubo cardiomyopathy.  LV function significantly different from her prior catheterization.  She will need aggressive medical therapy for LV dysfunction. I discussed the findings with her daughter.   CT Chest High Resolution  Result Date: 10/19/2020 CLINICAL DATA:  Interstitial lung disease, history of of left lung cancer, current smoker EXAM: CT CHEST WITHOUT CONTRAST TECHNIQUE: Multidetector CT imaging of the chest was performed following the standard protocol without intravenous contrast. High resolution imaging of the lungs, as well as inspiratory and expiratory imaging, was performed. COMPARISON:  CT chest angiogram, 02/25/2019, 11/16/2014 FINDINGS: Cardiovascular: Aortic atherosclerosis. Normal heart size. Status post mitral valve replacement. Left coronary artery calcifications. No pericardial effusion. Mediastinum/Nodes:  Unchanged prominent small hiatal hernia. Thyroid gland, trachea, and esophagus demonstrate no significant findings. Lungs/Pleura: Redemonstrated postoperative findings of anterior left upper lobe and dependent left lower lobe wedge resections. There is mild pulmonary fibrosis in a pattern with apical to basal gradient, featuring irregular peripheral interstitial opacity, septal thickening, traction bronchiectasis, and subpleural bronchiolectasis at the lung basis without clear evidence of honeycombing. Fibrotic findings are worsened over time on prior examinations dating back to 11/16/2014. No significant air trapping on expiratory phase imaging. No pleural effusion or pneumothorax. Upper Abdomen: No acute abnormality. Musculoskeletal: No chest wall mass or suspicious bone lesions identified. IMPRESSION: 1. There is mild pulmonary fibrosis in a pattern with apical to basal gradient, featuring irregular peripheral interstitial opacity, septal thickening, traction bronchiectasis, and subpleural bronchiolectasis at the lung basis without clear evidence of honeycombing. Fibrotic findings are worsened over time on prior examinations dating back to 11/16/2014. Findings are categorized as probable UIP per consensus guidelines: Diagnosis of Idiopathic Pulmonary Fibrosis: An Official ATS/ERS/JRS/ALAT Clinical Practice Guideline. [place], Iss 5, [phone], Jan 18 2017. 2. Redemonstrated postoperative findings of anterior left upper lobe and dependent left lower lobe wedge resections. No evidence of  malignant recurrence in the chest. 3. Coronary artery disease. Aortic Atherosclerosis (ICD10-I70.0). Electronically Signed   By: Eddie Candle M.D.   On: 10/19/2020 12:02   DG Chest Port 1 View  Result Date: 10/24/2020 CLINICAL DATA:  Chest pain EXAM: PORTABLE CHEST 1 VIEW COMPARISON:  02/24/2019 FINDINGS: Partial left lung resection with staple line again noted at the left apex. Mild bibasilar  interstitial infiltrate is again seen, stable since prior examination, more severe within the left lung base in keeping with bibasilar pulmonary fibrosis. Chronic thickening of the pulmonary interstitium relates to mild underlying interstitial lung disease. No superimposed confluent pulmonary infiltrate. No pneumothorax or pleural effusion. Mitral valve replacement has been performed. Cardiac size is within normal limits. The pulmonary vascularity is normal. Remote left clavicle ORIF with instrumentation has been performed. No acute bone abnormality. IMPRESSION: No radiographic evidence of acute cardiopulmonary disease. Stable bibasilar pulmonary fibrotic change. Electronically Signed   By: Fidela Salisbury MD   On: 10/24/2020 20:55   ECHOCARDIOGRAM COMPLETE  Result Date: 10/25/2020    ECHOCARDIOGRAM REPORT   Patient Name:   Joy Williams Date of Exam: 10/25/2020 Medical Rec #:  [id number]        Height:       66.5 in Accession #:    [id number]       Weight:       140.4 lb Date of Birth:  1950-12-08        BSA:          1.730 m Patient Age:    5 years         BP:           131/86 mmHg Patient Gender: F                HR:           93 bpm. Exam Location:  Inpatient Procedure: 2D Echo, Cardiac Doppler and Color Doppler Indications:    CHF  History:        Patient has prior history of Echocardiogram examinations, most                 recent 08/07/2020. CHF, Acute MI, COPD and Carotid Disease;                 Mitral Valve Disease and Mitral Valve Prolapse. Pulmonary                 fibrosis, lung cancer.  Sonographer:    Dustin Flock Referring Phys: [id number] PETER M [place] IMPRESSIONS  1. Left ventricular ejection fraction, by estimation, is 25 to 30%. The left ventricle has severely decreased function. The left ventricle demonstrates global hypokinesis. Left ventricular diastolic parameters are consistent with Grade I diastolic dysfunction (impaired relaxation).  2. Right ventricular systolic function is normal. The  right ventricular size is normal. There is moderately elevated pulmonary artery systolic pressure. The estimated right ventricular systolic pressure is 05.3 mmHg.  3. Left atrial size was moderately dilated.  4. The mitral valve has been repaired/replaced. No evidence of mitral valve regurgitation. No evidence of mitral stenosis. Severe mitral annular calcification. Echo findings are consistent with normal structure and function of the mitral valve prosthesis.  5. The aortic valve is normal in structure. Aortic valve regurgitation is not visualized. No aortic stenosis is present.  6. The inferior vena cava is normal in size with greater than 50% respiratory variability, suggesting right atrial pressure of 3 mmHg. Comparison(s): Prior images unable to be  directly viewed, comparison made by report only. The left ventricular function is worsened. FINDINGS  Left Ventricle: Left ventricular ejection fraction, by estimation, is 25 to 30%. The left ventricle has severely decreased function. The left ventricle demonstrates global hypokinesis. The left ventricular internal cavity size was normal in size. There is no left ventricular hypertrophy. Left ventricular diastolic parameters are consistent with Grade I diastolic dysfunction (impaired relaxation).  LV Wall Scoring: The mid and distal anterior wall, mid and distal lateral wall, mid and distal anterior septum, entire apex, mid and distal inferior wall, mid anterolateral segment, and mid inferoseptal segment are akinetic. The basal anterior segment is normal. Right Ventricle: The right ventricular size is normal. No increase in right ventricular wall thickness. Right ventricular systolic function is normal. There is moderately elevated pulmonary artery systolic pressure. The tricuspid regurgitant velocity is 3.57 m/s, and with an assumed right atrial pressure of 3 mmHg, the estimated right ventricular systolic pressure is 03.5 mmHg. Left Atrium: Left atrial size was  moderately dilated. Right Atrium: Right atrial size was normal in size. Pericardium: There is no evidence of pericardial effusion. Mitral Valve: The mitral valve has been repaired/replaced. Severe mitral annular calcification. No evidence of mitral valve regurgitation. There is a 31 mm Edwards Magna bioprosthetic valve present in the mitral position. Echo findings are consistent with normal structure and function of the mitral valve prosthesis. No evidence of mitral valve stenosis. MV peak gradient, 6.8 mmHg. The mean mitral valve gradient is 2.5 mmHg. Tricuspid Valve: The tricuspid valve is normal in structure. Tricuspid valve regurgitation is trivial. No evidence of tricuspid stenosis. Aortic Valve: The aortic valve is normal in structure. Aortic valve regurgitation is not visualized. No aortic stenosis is present. Pulmonic Valve: The pulmonic valve was normal in structure. Pulmonic valve regurgitation is not visualized. No evidence of pulmonic stenosis. Aorta: The aortic root is normal in size and structure. Venous: The inferior vena cava is normal in size with greater than 50% respiratory variability, suggesting right atrial pressure of 3 mmHg. IAS/Shunts: No atrial level shunt detected by color flow Doppler.  LEFT VENTRICLE PLAX 2D LVIDd:         3.70 cm  Diastology LVIDs:         2.80 cm  LV e' medial:    4.79 cm/s LV PW:         1.10 cm  LV E/e' medial:  20.1 LV IVS:        1.00 cm  LV e' lateral:   8.49 cm/s LVOT diam:     2.00 cm  LV E/e' lateral: 11.4 LV SV:         55 LV SV Index:   32 LVOT Area:     3.14 cm  RIGHT VENTRICLE RV Basal diam:  3.20 cm RV S prime:     9.25 cm/s TAPSE (M-mode): 2.0 cm LEFT ATRIUM           Index       RIGHT ATRIUM           Index LA diam:      4.10 cm 2.37 cm/m  RA Area:     13.60 cm LA Vol (A2C): 80.0 ml 46.24 ml/m RA Volume:   32.10 ml  18.55 ml/m LA Vol (A4C): 77.2 ml 44.62 ml/m  AORTIC VALVE LVOT Vmax:   99.20 cm/s LVOT Vmean:  62.500 cm/s LVOT VTI:    0.175 m  AORTA  Ao Root diam: 2.80 cm MITRAL VALVE  TRICUSPID VALVE MV Area (PHT): 7.59 cm    TR Peak grad:   51.0 mmHg MV Area VTI:   2.69 cm    TR Vmax:        357.00 cm/s MV Peak grad:  6.8 mmHg MV Mean grad:  2.5 mmHg    SHUNTS MV Vmax:       1.31 m/s    Systemic VTI:  0.18 m MV Vmean:      75.9 cm/s   Systemic Diam: 2.00 cm MV Decel Time: 100 msec MV E velocity: 96.40 cm/s MV A velocity: 73.70 cm/s MV E/A ratio:  1.31 Candee Furbish MD Electronically signed by Candee Furbish MD Signature Date/Time: 10/25/2020/12:03:51 PM    Final    Disposition   Pt is being discharged home today in good condition.  Follow-up Plans & Appointments     Follow-up Information     [facility] Follow up on 11/06/2020.   Specialty: Cardiology Why: Please arrive 15 minutes early for your 1pm post-hospital cardiology appointment. You can use entrance C off [address] which offers free [place] parking. Contact information: [address] [id number] mc [place] [facility] [facility] Follow up on 11/15/2020.   Specialty: Cardiology Why: Please arrive 15 minutes early for your 1:20pm appointment with Dr. Johney Frame at our [facility] location. Contact information: [place] [address] [phone]               Discharge Instructions     Amb Referral to Cardiac Rehabilitation   Complete by: As directed    Diagnosis: STEMI   After initial evaluation and assessments completed: Virtual Based Care may be provided alone or in conjunction with Phase 2 Cardiac Rehab based on patient barriers.: Yes       Discharge Medications   Allergies as of 10/29/2020       Reactions   Penicillins Hives   Unknown, occurred as a child Has patient had a PCN reaction causing immediate rash, facial/tongue/throat swelling, SOB or lightheadedness with hypotension: Yes Has  patient had a PCN reaction causing severe rash involving mucus membranes or skin necrosis: No Has patient had a PCN reaction that required hospitalization  Has patient had a PCN reaction occurring within the last 10 years: No If all of the above answers are "NO", then may proceed with Cephalosporin use. No        Medication List     STOP taking these medications    metoprolol succinate 25 MG 24 hr tablet Commonly known as: Toprol XL   nitroGLYCERIN 0.4 MG SL tablet Commonly known as: NITROSTAT       TAKE these medications    acetaminophen 500 MG tablet Commonly known as: TYLENOL Take 1,000 mg by mouth every 6 (six) hours as needed for mild pain or headache.   clopidogrel 75 MG tablet Commonly known as: PLAVIX Take 1 tablet (75 mg total) by mouth daily. What changed: See the new instructions.   empagliflozin 10 MG Tabs tablet Commonly known as: JARDIANCE Take 1 tablet (10 mg total) by mouth daily.   ferrous sulfate 325 (65 FE) MG tablet Take 1 tablet (325 mg total) by mouth daily.   levothyroxine 175 MCG tablet Commonly known as: SYNTHROID TAKE 1 TABLET BY MOUTH DAILY BEFORE BREAKFAST What changed:  how much to take how to take this when to take this additional instructions  MELATONIN PO Take 1 tablet by mouth at bedtime as needed (sleep).   multivitamin with minerals tablet Take 1 tablet by mouth daily.   nicotine 7 mg/24hr patch Commonly known as: NICODERM CQ - dosed in mg/24 hr Place 1 patch (7 mg total) onto the skin daily.   rosuvastatin 20 MG tablet Commonly known as: CRESTOR Take 1 tablet (20 mg total) by mouth daily.   Trelegy Ellipta 100-62.5-25 MCG/INH Aepb Generic drug: Fluticasone-Umeclidin-Vilant TAKE 1 PUFF BY MOUTH EVERY DAY What changed:  how much to take how to take this when to take this additional instructions   Turmeric 500 MG Caps Take 500 mg by mouth daily.   vitamin B-12 1000 MCG tablet Commonly known as:  CYANOCOBALAMIN Take 1 tablet (1,000 mcg total) by mouth daily.           Outstanding Labs/Studies   Anticipate repeat echo in 3 months to monitor for improvement in LV function  Duration of Discharge Encounter   Greater than 30 minutes including physician time.  Signed, Abigail Butts, PA-C 10/29/2020, 9:58 AM

## 2020-10-29 NOTE — Plan of Care (Signed)

## 2020-10-29 NOTE — Discharge Instructions (Addendum)
Heart Failure Education: Weigh yourself EVERY morning after you go to the bathroom but before you eat or drink anything. Write this number down in a weight log/diary. If you gain 3 pounds overnight or 5 pounds in a week, call the office. Take your medicines as prescribed. If you have concerns about your medications, please call us before you stop taking them.  Eat low salt foods--Limit salt (sodium) to 2000 mg per day. This will help prevent your body from holding onto fluid. Read food labels as many processed foods have a lot of sodium, especially canned goods and prepackaged meats. If you would like some assistance choosing low sodium foods, we would be happy to set you up with a nutritionist. Stay as active as you can everyday. Staying active will give you more energy and make your muscles stronger. Start with 5 minutes at a time and work your way up to 30 minutes a day. Break up your activities--do some in the morning and some in the afternoon. Start with 3 days per week and work your way up to 5 days as you can.  If you have chest pain, feel short of breath, dizzy, or lightheaded, STOP. If you don't feel better after a short rest, call 911. If you do feel better, call the office to let us know you have symptoms with exercise. Limit all fluids for the day to less than 2 liters. Fluid includes all drinks, coffee, juice, ice chips, soup, jello, and all other liquids.

## 2020-10-29 NOTE — Progress Notes (Signed)
Progress Note   Subjective   Doing well today, the patient denies CP or SOB.  No new concerns  Inpatient Medications    Scheduled Meds:  Chlorhexidine Gluconate Cloth  6 each Topical Daily   clopidogrel  75 mg Oral Daily   empagliflozin  10 mg Oral Daily   fluticasone furoate-vilanterol  1 puff Inhalation Daily   And   umeclidinium bromide  1 puff Inhalation Daily   levothyroxine  175 mcg Oral Q0600   nicotine  7 mg Transdermal Daily   rosuvastatin  20 mg Oral Daily   sodium chloride flush  10-40 mL Intracatheter Q12H   Continuous Infusions:  sodium chloride     ferumoxytol 510 mg (10/27/20 1221)   promethazine (PHENERGAN) injection (IM or IVPB) Stopped (10/26/20 0057)   PRN Meds: sodium chloride, acetaminophen, acetaminophen, azelastine, magnesium hydroxide, nitroGLYCERIN, ondansetron (ZOFRAN) IV, promethazine (PHENERGAN) injection (IM or IVPB), sodium chloride flush   Vital Signs    Vitals:   10/28/20 2300 10/29/20 0300 10/29/20 0722 10/29/20 0731  BP: 103/67 102/81 (!) 100/53   Pulse: 79 73 77   Resp: 20 19 19    Temp: 98 F (36.7 C) 98 F (36.7 C) (!) 97.5 F (36.4 C)   TempSrc: Oral Oral Oral   SpO2: 96% 95% 96% 97%  Weight:      Height:        Intake/Output Summary (Last 24 hours) at 10/29/2020 9379 Last data filed at 10/28/2020 2147 Gross per 24 hour  Intake 513 ml  Output --  Net 513 ml   Filed Weights   10/26/20 0600  Weight: 61.1 kg    Telemetry    sinus - Personally Reviewed  Physical Exam   GEN- The patient is well appearing, alert and oriented x 3 today.   Head- normocephalic, atraumatic Eyes-  Sclera clear, conjunctiva pink Ears- hearing intact Oropharynx- clear Neck- supple, Lungs-  normal work of breathing Heart- Regular rate and rhythm  GI- soft  Extremities- no clubbing, cyanosis, or edema  MS- no significant deformity or atrophy Skin- no rash or lesion Psych- euthymic mood, full affect Neuro- strength and sensation are  intact   Labs    Chemistry Recent Labs  Lab 10/24/20 1545 10/25/20 0822 10/26/20 0040 10/28/20 2147  NA 132* 132* 129* 129*  K 3.3* 4.1 4.1 3.6  CL 99 95* 95* 96*  CO2 23 24 24 25   GLUCOSE 164* 108* 108* 100*  BUN 11 12 12 13   CREATININE 0.68 0.71 0.74 0.65  CALCIUM 9.2 9.8 8.9 8.6*  PROT 7.2  --   --   --   ALBUMIN 4.1  --   --   --   AST 33  --   --   --   ALT 18  --   --   --   ALKPHOS 57  --   --   --   BILITOT 0.5  --   --   --   GFRNONAA >60 >60 >60 >60  ANIONGAP 10 13 10 8      Hematology Recent Labs  Lab 10/24/20 1542 10/24/20 1545  WBC  --  10.8*  RBC  --  4.46  HGB 12.2 12.1  HCT 36.0 37.3  MCV  --  83.6  MCH  --  27.1  MCHC  --  32.4  RDW  --  12.4  PLT  --  259     Patient ID  70 y.o. female with a hx of  NSCLC > adenocarcinoma, AB/ Scleroderma assoc UIP, D-CHF, cold agglutinin dz, COPD, SEM, Hypothyroid, MVP s/p minimally invasive bioprosthetic MVR , Raynaud's, carotid dz s/p R-CEA, who is being seen 10/24/2020 for the evaluation of STEMI.  Assessment & Plan    1.  Stress induced CM/ takotsubo Stable No change required today  2. S/p MV replacement with bioprosthetic valve Stable No change required today   3. Scleroderma/ UIP Stable No change required today  DC to home    Thompson Grayer MD, [facility] 10/29/2020 9:37 AM

## 2020-10-31 ENCOUNTER — Ambulatory Visit
Admission: RE | Admit: 2020-10-31 | Discharge: 2020-10-31 | Disposition: A | Discharge status: Home / Self Care | Payer: Medicare Other | Source: Ambulatory Visit | Attending: Physician Assistant | Admitting: Physician Assistant

## 2020-10-31 ENCOUNTER — Other Ambulatory Visit: Payer: Self-pay | Admitting: Physician Assistant

## 2020-10-31 ENCOUNTER — Other Ambulatory Visit: Payer: Self-pay

## 2020-10-31 DIAGNOSIS — G459 Transient cerebral ischemic attack, unspecified: Secondary | ICD-10-CM

## 2020-10-31 DIAGNOSIS — Z09 Encounter for follow-up examination after completed treatment for conditions other than malignant neoplasm: Secondary | ICD-10-CM

## 2020-10-31 DIAGNOSIS — Z1231 Encounter for screening mammogram for malignant neoplasm of breast: Secondary | ICD-10-CM

## 2020-11-01 ENCOUNTER — Telehealth (HOSPITAL_COMMUNITY): Payer: Self-pay

## 2020-11-01 ENCOUNTER — Encounter (HOSPITAL_BASED_OUTPATIENT_CLINIC_OR_DEPARTMENT_OTHER): Payer: Self-pay

## 2020-11-01 NOTE — Telephone Encounter (Signed)
Pt insurance is active and benefits verified through [employer] Co-pay 0, DED $233/$233 met, out of pocket 0/0 met, co-insurance 20%. no pre-authorization required. Passport, 11/01/2020@10:34am, REF# [id number]  2ndary insurance is active and benefits verified through [employer]. Co-pay 0, DED 0/0 met, out of pocket 0/0 met, co-insurance 0. No pre-authorization required. Passport, 11/01/2020@10:31am, REF# [id number]  Will contact patient to see if she is interested in the Cardiac Rehab Program. If interested, patient will need to complete follow up appt. Once completed, patient will be contacted for scheduling upon review by the RN Navigator. 

## 2020-11-01 NOTE — Telephone Encounter (Signed)
Called patient to see if she is interested in the Cardiac Rehab Program. Patient expressed interest. Explained scheduling process and went over insurance, patient verbalized understanding. Will contact patient for scheduling once f/u has been completed. 

## 2020-11-02 ENCOUNTER — Telehealth: Payer: Self-pay | Admitting: Physician Assistant

## 2020-11-02 DIAGNOSIS — G459 Transient cerebral ischemic attack, unspecified: Secondary | ICD-10-CM | POA: Diagnosis not present

## 2020-11-02 DIAGNOSIS — R072 Precordial pain: Secondary | ICD-10-CM | POA: Diagnosis not present

## 2020-11-02 DIAGNOSIS — I213 ST elevation (STEMI) myocardial infarction of unspecified site: Secondary | ICD-10-CM | POA: Diagnosis not present

## 2020-11-02 DIAGNOSIS — I5021 Acute systolic (congestive) heart failure: Secondary | ICD-10-CM | POA: Diagnosis not present

## 2020-11-02 DIAGNOSIS — Z09 Encounter for follow-up examination after completed treatment for conditions other than malignant neoplasm: Secondary | ICD-10-CM | POA: Diagnosis not present

## 2020-11-02 DIAGNOSIS — I5181 Takotsubo syndrome: Secondary | ICD-10-CM | POA: Diagnosis not present

## 2020-11-02 NOTE — Telephone Encounter (Signed)
Error

## 2020-11-03 ENCOUNTER — Encounter (HOSPITAL_BASED_OUTPATIENT_CLINIC_OR_DEPARTMENT_OTHER): Payer: Self-pay

## 2020-11-03 ENCOUNTER — Emergency Department (HOSPITAL_COMMUNITY)
Admission: EM | Admit: 2020-11-03 | Discharge: 2020-11-03 | Disposition: A | Discharge status: Home / Self Care | Payer: Medicare Other | Attending: Emergency Medicine | Admitting: Emergency Medicine

## 2020-11-03 ENCOUNTER — Encounter (HOSPITAL_COMMUNITY): Payer: Self-pay

## 2020-11-03 ENCOUNTER — Telehealth: Payer: Self-pay | Admitting: *Deleted

## 2020-11-03 ENCOUNTER — Emergency Department (HOSPITAL_COMMUNITY): Payer: Medicare Other

## 2020-11-03 ENCOUNTER — Other Ambulatory Visit: Payer: Self-pay

## 2020-11-03 DIAGNOSIS — R110 Nausea: Secondary | ICD-10-CM | POA: Diagnosis not present

## 2020-11-03 DIAGNOSIS — R079 Chest pain, unspecified: Secondary | ICD-10-CM | POA: Diagnosis not present

## 2020-11-03 DIAGNOSIS — Z85118 Personal history of other malignant neoplasm of bronchus and lung: Secondary | ICD-10-CM | POA: Insufficient documentation

## 2020-11-03 DIAGNOSIS — I5181 Takotsubo syndrome: Secondary | ICD-10-CM | POA: Diagnosis not present

## 2020-11-03 DIAGNOSIS — I5021 Acute systolic (congestive) heart failure: Secondary | ICD-10-CM | POA: Diagnosis not present

## 2020-11-03 DIAGNOSIS — R0789 Other chest pain: Secondary | ICD-10-CM

## 2020-11-03 DIAGNOSIS — Z7902 Long term (current) use of antithrombotics/antiplatelets: Secondary | ICD-10-CM | POA: Diagnosis not present

## 2020-11-03 DIAGNOSIS — J449 Chronic obstructive pulmonary disease, unspecified: Secondary | ICD-10-CM | POA: Insufficient documentation

## 2020-11-03 DIAGNOSIS — F17210 Nicotine dependence, cigarettes, uncomplicated: Secondary | ICD-10-CM | POA: Insufficient documentation

## 2020-11-03 DIAGNOSIS — I5032 Chronic diastolic (congestive) heart failure: Secondary | ICD-10-CM | POA: Insufficient documentation

## 2020-11-03 DIAGNOSIS — R072 Precordial pain: Secondary | ICD-10-CM | POA: Insufficient documentation

## 2020-11-03 DIAGNOSIS — Z79899 Other long term (current) drug therapy: Secondary | ICD-10-CM | POA: Diagnosis not present

## 2020-11-03 DIAGNOSIS — Z8673 Personal history of transient ischemic attack (TIA), and cerebral infarction without residual deficits: Secondary | ICD-10-CM | POA: Diagnosis not present

## 2020-11-03 DIAGNOSIS — Z7951 Long term (current) use of inhaled steroids: Secondary | ICD-10-CM | POA: Diagnosis not present

## 2020-11-03 DIAGNOSIS — R42 Dizziness and giddiness: Secondary | ICD-10-CM | POA: Insufficient documentation

## 2020-11-03 DIAGNOSIS — R55 Syncope and collapse: Secondary | ICD-10-CM | POA: Diagnosis not present

## 2020-11-03 DIAGNOSIS — I959 Hypotension, unspecified: Secondary | ICD-10-CM | POA: Diagnosis not present

## 2020-11-03 DIAGNOSIS — I2510 Atherosclerotic heart disease of native coronary artery without angina pectoris: Secondary | ICD-10-CM | POA: Insufficient documentation

## 2020-11-03 DIAGNOSIS — E039 Hypothyroidism, unspecified: Secondary | ICD-10-CM | POA: Insufficient documentation

## 2020-11-03 DIAGNOSIS — J45909 Unspecified asthma, uncomplicated: Secondary | ICD-10-CM | POA: Insufficient documentation

## 2020-11-03 LAB — URINALYSIS, ROUTINE W REFLEX MICROSCOPIC
Bacteria, UA: NONE SEEN
Bilirubin Urine: NEGATIVE
Glucose, UA: >=500 mg/dL — AB
Hgb urine dipstick: NEGATIVE
Ketones, ur: NEGATIVE mg/dL
Leukocytes,Ua: NEGATIVE
Nitrite: NEGATIVE
Protein, ur: NEGATIVE mg/dL
Specific Gravity, Urine: 1.02 (ref 1.005–1.030)
pH: 5 (ref 5.0–8.0)

## 2020-11-03 LAB — COMPREHENSIVE METABOLIC PANEL
ALT: 18 U/L (ref 0–44)
AST: 28 U/L (ref 15–41)
Albumin: 3.7 g/dL (ref 3.5–5.0)
Alkaline Phosphatase: 54 U/L (ref 38–126)
Anion gap: 9 (ref 5–15)
BUN: 11 mg/dL (ref 8–23)
CO2: 26 mmol/L (ref 22–32)
Calcium: 9.4 mg/dL (ref 8.9–10.3)
Chloride: 98 mmol/L (ref 98–111)
Creatinine, Ser: 0.67 mg/dL (ref 0.44–1.00)
GFR, Estimated: >60 mL/min (ref >60)
Glucose, Bld: 96 mg/dL (ref 70–99)
Potassium: 4.2 mmol/L (ref 3.5–5.1)
Sodium: 133 mmol/L — ABNORMAL LOW (ref 135–145)
Total Bilirubin: 0.4 mg/dL (ref 0.3–1.2)
Total Protein: 6.9 g/dL (ref 6.5–8.1)

## 2020-11-03 LAB — CBC WITH DIFFERENTIAL/PLATELET
Abs Immature Granulocytes: 0.09 10*3/uL — ABNORMAL HIGH (ref 0.00–0.07)
Basophils Absolute: 0 10*3/uL (ref 0.0–0.1)
Basophils Relative: 0 %
Eosinophils Absolute: 0.1 10*3/uL (ref 0.0–0.5)
Eosinophils Relative: 1 %
HCT: 39.2 % (ref 36.0–46.0)
Hemoglobin: 12.2 g/dL (ref 12.0–15.0)
Immature Granulocytes: 1 %
Lymphocytes Relative: 17 %
Lymphs Abs: 1.4 10*3/uL (ref 0.7–4.0)
MCH: 26.9 pg (ref 26.0–34.0)
MCHC: 31.1 g/dL (ref 30.0–36.0)
MCV: 86.3 fL (ref 80.0–100.0)
Monocytes Absolute: 0.6 10*3/uL (ref 0.1–1.0)
Monocytes Relative: 7 %
Neutro Abs: 6.5 10*3/uL (ref 1.7–7.7)
Neutrophils Relative %: 74 %
Platelets: 259 10*3/uL (ref 150–400)
RBC: 4.54 MIL/uL (ref 3.87–5.11)
RDW: 13.2 % (ref 11.5–15.5)
WBC: 8.7 10*3/uL (ref 4.0–10.5)
nRBC: 0 % (ref 0.0–0.2)

## 2020-11-03 LAB — TROPONIN I (HIGH SENSITIVITY)
Troponin I (High Sensitivity): 41 ng/L — ABNORMAL HIGH (ref <18)
Troponin I (High Sensitivity): 44 ng/L — ABNORMAL HIGH (ref <18)

## 2020-11-03 LAB — CBG MONITORING, ED
Glucose-Capillary: 85 mg/dL (ref 70–99)
Glucose-Capillary: 94 mg/dL (ref 70–99)

## 2020-11-03 NOTE — Telephone Encounter (Signed)
Pt has appt with Heart Failure clinic to see Dr. Haroldine Laws on 11/06/20 at 1 pm.  This is TOC appt with them with their clinic.

## 2020-11-03 NOTE — ED Triage Notes (Signed)
See chief complaint

## 2020-11-03 NOTE — Consult Note (Signed)
Cardiology Consultation:   Patient ID: Joy Williams MRN: [id number]; DOB: 1951-02-12  Admit date: 11/03/2020 Date of Consult: 11/03/2020  PCP:  Lorrene Reid, [place] [facility] Cardiologist:  Freada Bergeron, MD        Patient Profile:   Joy Williams is a 70 y.o. female with a hx of MVP s/p minimally invasive bioprosthetic MVR, carotid artery disease s/p R CEA, scleroderma, cold agglutinin disease, raynaud's disease, UIP, NSCLC, COPD, hypothyroidism and recent admission for STEMI found to have clean coronaries on cath with diagnosis of Takostubo CM discharged on 6/12  who is being seen 11/03/2020 for the evaluation of chest pain at the request of Dr. Regenia Skeeter.  History of Present Illness:   Joy Williams is a 70 year old female with history detailed above who is regularly followed by me in clinic. She was recently admitted from 10/24/20-10/29/20 where she presented with chest pain and nausea found to have STE in I and aVL for which a STEMI was called. She was taken to the cath lab which showed 25% pLAD stenosis, otherwise normal coronary arteries, and EF 25-35% with LV gram c/w takotsubo cardiomyopathy. She was started on plavix. TTE showed EF 25-30%, global hypokinesis, G1DD, moderately elevated PA pressures, moderate LAE, and normal structure/function of mitral valve s/p repair/replacement. GDMT was limited due to hypotension and she was discharged on 10/29/20.  She now re-presents to the ER with chest pain and orthostatic symptoms. Labs notable for trop 41-->44 which is significantly down from 5000 on last admission, Cr 0.67, normal electrolytes and LFTs, normal CBC. CXR without acute pathology. Blood pressures low 100s. HR 60s. Orthostatics negative on my exam.  Currently, the patient states she feels better. Still has some chest "fullness" but dizziness improves. Notes that her symptoms of dizziness are only really present when she goes from a seated to a standing  position or laying to standing. Notably, she has been on jardiance and trying to maintain a low salt diet and restricted fluid intake due to newly diagnosed stress CM. Prior to this diagnosis, she was already having orthostatic symptoms for which we were trying to manage conservatively with compression socks, stopping hypertensive agents and liberalizing fluid intake. We discussed how her current symptoms are likely due to know orthostasis that was worsened by the diuretic effect of jardiance.   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 [facility] (had to have botox injection)   COPD (chronic obstructive pulmonary disease) ([place])    Family history of adverse reaction to anesthesia    Mother- very sensitive to medication   Heart murmur    MVP   Hypothyroidism    Lung cancer ([place]) 12/30/2014   Synchronous primaries:  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, each treated by wedge resection with post-op adjuvant chemoRx at [facility]   MVP (mitral valve prolapse) 2018   s/p Minimally-Invasive Mitral Valve Replacement w/  [facility] Mitral bovine bioprosthetic tissue valve (size 35mm, model # 7300TFX, serial # [id number])   PONV (postoperative nausea and vomiting)    Raynaud's syndrome    S/P minimally invasive mitral valve  replacement with bioprosthetic valve 06/27/2016   31 mm [facility] mitral bovine bioprosthetic tissue valve placed via right mini thoracotomy approach   Severe mitral regurgitation 11/15/2014   Shortness of breath dyspnea    with exertion   STD (sexually transmitted disease)    STEMI (ST elevation myocardial infarction) ([place]) 10/24/2020   Telangiectasia     Past Surgical History:  Procedure  Laterality Date   BACK SURGERY     x 3  Disectomy   BREAST BIOPSY Left    CARDIAC CATHETERIZATION N/A 03/08/2016   Procedure: Right/Left Heart Cath and Coronary Angiography;  Surgeon: Sherren Mocha, MD;  Location: [place] [facility];  Service: Cardiovascular;  Laterality: N/A;   CLAVICLE SURGERY Left 2013   plate to left collar bone   ENDARTERECTOMY Right 08/09/2020   Procedure: RIGHT CAROTID ENDARTERECTOMY;  Surgeon: Waynetta Sandy, MD;  Location: [place];  Service: Vascular;  Laterality: Right;   LAPAROSCOPY     ? reason-age 2    LEFT HEART CATH AND CORONARY ANGIOGRAPHY N/A 02/25/2019   Procedure: LEFT HEART CATH AND CORONARY ANGIOGRAPHY;  Surgeon: Jettie Booze, MD;  Location: [place] [facility];  Service: Cardiovascular;  Laterality: N/A;   LEFT HEART CATH AND CORONARY ANGIOGRAPHY N/A 10/24/2020   Procedure: LEFT HEART CATH AND CORONARY ANGIOGRAPHY;  Surgeon: Jettie Booze, MD;  Location: [place] [facility];  Service: Cardiovascular;  Laterality: N/A;   LUNG CANCER SURGERY     MITRAL VALVE REPAIR Right 06/27/2016   Procedure: MINIMALLY INVASIVE MITRAL VALVE REPLACEMENT;  Surgeon: Rexene Alberts, MD;  Location: [place];  Service: Open Heart Surgery;  Laterality: Right;   TEE WITHOUT CARDIOVERSION N/A 02/22/2016   Procedure: TRANSESOPHAGEAL ECHOCARDIOGRAM (TEE);  Surgeon: Dorothy Spark, MD;  Location: [place];  Service: Cardiovascular;  Laterality: N/A;   TEE WITHOUT CARDIOVERSION N/A 06/27/2016   Procedure: TRANSESOPHAGEAL ECHOCARDIOGRAM (TEE);  Surgeon: Rexene Alberts, MD;  Location: [place];  Service: Open Heart Surgery;  Laterality: N/A;   TONSILLECTOMY     VIDEO ASSISTED THORACOSCOPY (VATS)/WEDGE RESECTION Left 12/30/2014   Bronchoscopy, Mediastinoscopy, Left VATS for Wedge resection LUL x2 adn LLL x1 - Dr. Elenor Quinones at [facility] Left 2017   injected with botox   wrist surgery Left 2015   plate to wrist      Home Medications:  Prior to  Admission medications   Medication Sig Start Date End Date Taking? Authorizing Provider  acetaminophen (TYLENOL) 500 MG tablet Take 1,000 mg by mouth every 6 (six) hours as needed for mild pain or headache.    [provider]  clopidogrel (PLAVIX) 75 MG tablet Take 1 tablet (75 mg total) by mouth daily. 10/29/20   Kroeger, Lorelee Cover., PA-C  ferrous sulfate 325 (65 FE) MG tablet Take 1 tablet (325 mg total) by mouth daily. 10/29/20 10/29/21  Kroeger, Lorelee Cover., PA-C  Fluticasone-Umeclidin-Vilant (TRELEGY ELLIPTA) 100-62.5-25 MCG/INH AEPB TAKE 1 PUFF BY MOUTH EVERY DAY 09/13/20   Tanda Rockers, MD  levothyroxine (SYNTHROID) 175 MCG tablet TAKE 1 TABLET BY MOUTH DAILY BEFORE BREAKFAST 06/16/20   Abonza, Maritza, PA-C  MELATONIN PO Take 1 tablet by mouth at bedtime as needed (sleep).    [provider]  Multiple Vitamins-Minerals (MULTIVITAMIN WITH MINERALS) tablet Take 1 tablet by mouth daily.    [provider]  nicotine (NICODERM CQ - DOSED IN MG/24 HR) 7 mg/24hr patch Place 1 patch (7 mg total) onto the skin daily.  10/29/20   Kroeger, Lorelee Cover., PA-C  rosuvastatin (CRESTOR) 20 MG tablet Take 1 tablet (20 mg total) by mouth daily. 08/15/20   Freada Bergeron, MD  Turmeric 500 MG CAPS Take 500 mg by mouth daily.    [provider]  vitamin B-12 (CYANOCOBALAMIN) 1000 MCG tablet Take 1 tablet (1,000 mcg total) by mouth daily. 08/10/20   Geradine Girt, DO    Inpatient Medications: Scheduled Meds:  Continuous Infusions:  PRN Meds:   Allergies:    Allergies  Allergen Reactions   Penicillins Hives    Unknown, occurred as a child Has patient had a PCN reaction causing immediate rash, facial/tongue/throat swelling, SOB or lightheadedness with hypotension: Yes Has patient had a PCN reaction causing severe rash involving mucus membranes or skin necrosis: No Has patient had a PCN reaction that required hospitalization  Has patient had a PCN reaction occurring  within the last 10 years: No If all of the above answers are "NO", then may proceed with Cephalosporin use. No    Social History:   Social History   Socioeconomic History   Marital status: Divorced    Spouse name: Not on file   Number of children: Not on file   Years of education: 16   Highest education level: Not on file  Occupational History   Occupation: Retired  Tobacco Use   Smoking status: Former    Packs/day: 1.00    Years: 24.00    Pack years: 24.00    Types: E-cigarettes, Cigarettes    Start date: 05/21/1983   Smokeless tobacco: Never   Tobacco comments:    Stopped smoking 10/12/2020  Vaping Use   Vaping Use: Former   Quit date: 05/20/2006  Substance and Sexual Activity   Alcohol use: Yes    Alcohol/week: 2.0 standard drinks    Types: 1 Shots of liquor, 1 Standard drinks or equivalent per week   Drug use: No   Sexual activity: Not Currently    Partners: Male    Birth control/protection: Post-menopausal  Other Topics Concern   Not on file  Social History Narrative   Not on file   Social Determinants of Health   Financial Resource Strain: Low Risk    Difficulty of Paying Living Expenses: Not very hard  Food Insecurity: No Food Insecurity   Worried About Charity fundraiser in the Last Year: Never true   Arboriculturist in the Last Year: Never true  Transportation Needs: Unmet Transportation Needs   Lack of Transportation (Medical): No   Lack of Transportation (Non-Medical): Yes  Physical Activity: Not on file  Stress: Not on file  Social Connections: Not on file  Intimate Partner Violence: Not on file    Family History:    Family History  Problem Relation Age of Onset   Mitral valve prolapse Mother    Dementia Father    Prostate cancer Father    Mitral valve prolapse Brother    Mitral valve prolapse Sister    Colon cancer Neg Hx    Colon polyps Neg Hx    Rectal cancer Neg Hx    Esophageal cancer Neg Hx    Stomach cancer Neg Hx    Heart attack  Neg Hx      ROS:  Please see the history of present illness.  Review of Systems  Constitutional:  Positive for malaise/fatigue.  HENT:  Negative for sore throat.   Eyes:  Negative for blurred vision.  Respiratory:  Negative  for shortness of breath.   Cardiovascular:  Positive for chest pain. Negative for palpitations, orthopnea, claudication, leg swelling and PND.  Gastrointestinal:  Positive for nausea.  Genitourinary:  Negative for flank pain.  Musculoskeletal:  Negative for falls.  Neurological:  Positive for dizziness. Negative for loss of consciousness.  Psychiatric/Behavioral:  The patient is nervous/anxious.    All other ROS reviewed and negative.     Physical Exam/Data:   Vitals:   11/03/20 1259 11/03/20 1320 11/03/20 1500 11/03/20 1600  BP:  110/71 107/64 (!) 105/58  Pulse:  66 65 63  Resp:  17 20 15   SpO2:  100% 100% 97%  Weight: 60.8 kg     Height: 5' 6.5" (1.689 m)      No intake or output data in the 24 hours ending 11/03/20 1616 Last 3 Weights 11/03/2020 10/26/2020 10/12/2020  Weight (lbs) 134 lb 134 lb 11.2 oz 140 lb 6.4 oz  Weight (kg) 60.782 kg 61.1 kg 63.685 kg     Body mass index is 21.3 kg/m.  General:  Thin, comfortable HEENT: normal Neck: no JVD Vascular: No carotid bruits; FA pulses 2+ bilaterally without bruits  Cardiac:  normal S1, S2; RRR; no murmur  Lungs:  coarse crackles most notably on the left lung base (chronic) Abd: soft, nontender, no hepatomegaly  Ext: no edema Musculoskeletal:  No deformities, BUE and BLE strength normal and equal Skin: warm and dry  Neuro:  CNs 2-12 intact, no focal abnormalities noted Psych:  Normal affect   EKG:  The EKG was personally reviewed and demonstrates: NSR, anterior q waves, LVH with repol Telemetry:  Telemetry was personally reviewed and demonstrates:  NSR  Relevant CV Studies: LHC 10/24/20: Prox LAD lesion is 25% stenosed. No significant CAD. The left ventricular systolic function is normal. LV end  diastolic pressure is mildly elevated. The left ventricular ejection fraction is 25-35% by visual estimate. There is no aortic valve stenosis. Prior mitral valve surgery.   Despite lateral ST elevation, we only found nonobstructive coronary artery disease.  Left ventriculogram fits with a pattern of Takotsubo cardiomyopathy.  LV function significantly different from her prior catheterization.  She will need aggressive medical therapy for LV dysfunction.   I discussed the findings with her daughter.     Echocardiogram 10/25/20: 1. Left ventricular ejection fraction, by estimation, is 25 to 30%. The  left ventricle has severely decreased function. The left ventricle  demonstrates global hypokinesis. Left ventricular diastolic parameters are  consistent with Grade I diastolic  dysfunction (impaired relaxation).   2. Right ventricular systolic function is normal. The right ventricular  size is normal. There is moderately elevated pulmonary artery systolic  pressure. The estimated right ventricular systolic pressure is 91.4 mmHg.   3. Left atrial size was moderately dilated.   4. The mitral valve has been repaired/replaced. No evidence of mitral  valve regurgitation. No evidence of mitral stenosis. Severe mitral annular  calcification. Echo findings are consistent with normal structure and  function of the mitral valve  prosthesis.   5. The aortic valve is normal in structure. Aortic valve regurgitation is  not visualized. No aortic stenosis is present.   6. The inferior vena cava is normal in size with greater than 50%  respiratory variability, suggesting right atrial pressure of 3 mmHg.   Comparison(s): Prior images unable to be directly viewed, comparison made  by report only. The left ventricular function is worsened.  Laboratory Data:  High Sensitivity Troponin:   Recent Labs  Lab 10/24/20 1545 10/25/20 0822 11/03/20 1259  TROPONINIHS 916* 4,990* 41*     Chemistry Recent  Labs  Lab 10/28/20 2147 11/03/20 1259  NA 129* 133*  K 3.6 4.2  CL 96* 98  CO2 25 26  GLUCOSE 100* 96  BUN 13 11  CREATININE 0.65 0.67  CALCIUM 8.6* 9.4  GFRNONAA >60 >60  ANIONGAP 8 9    Recent Labs  Lab 11/03/20 1259  PROT 6.9  ALBUMIN 3.7  AST 28  ALT 18  ALKPHOS 54  BILITOT 0.4   Hematology Recent Labs  Lab 11/03/20 1259  WBC 8.7  RBC 4.54  HGB 12.2  HCT 39.2  MCV 86.3  MCH 26.9  MCHC 31.1  RDW 13.2  PLT 259   BNPNo results for input(s): BNP, PROBNP in the last 168 hours.  DDimer No results for input(s): DDIMER in the last 168 hours.   Radiology/Studies:  DG Chest Port 1 View  Result Date: 11/03/2020 CLINICAL DATA:  Chest pain, near syncope EXAM: PORTABLE CHEST 1 VIEW COMPARISON:  Chest radiograph 10/24/2020 FINDINGS: Unchanged cardiomediastinal silhouette with mitral valve replacement and annular calcification. Unchanged bibasilar fibrotic interstitial lung disease. No new focal airspace consolidation. Postsurgical changes in the left upper lung. No large pleural effusion or visible pneumothorax. Unchanged left clavicle fixation hardware. No acute osseous abnormality. 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 No evidence of new focal airspace disease. Electronically Signed   By: Maurine Simmering   On: 11/03/2020 13:39     Assessment and Plan:   #Orthostatic Hypotension: Patient presents with orthostatic symptoms with history of known orthostasis with mild chest discomfort that have been ongoing since her discharge. Specifically, patient has been having dizziness with changing positions. Likely chronic orthostasis was worsened by initiation of jardiance as well as fluid and salt restriction after she was diagnosed with stress CM as detailed above. Will stop jardiance and liberalize salt and fluid intake a little bit and see how she responds. We have close clinic follow-up in 10 days.  -Stop jardiance -Compression socks -Okay to  liberalize salt and fluid intake slightly -Elevate legs at end of the day -Slow position changes  #Chest Pain: Low suspicion for ACS. Trop downtrending from last admission and cath without disease. Will continue management of stress CM and OH as detailed above and below. -No further cardiac testing needed at this time  #Takotsubo cardiomyopathy:  Patient had a recent admission from 10/24/20-10/29/20 where she presented with chest pain, nausea, and vomiting and found to have anterolateral STE on EKG. HsTrop increased from 916>4990. She underwent emergent cardiac catheterization which showed 25% pLAD stenosis, otherwise normal coronary arteries, and EF 25-35% with LV gram c/w takotsubo cardiomyopathy. She was started on plavix for conservative management. Echocardiogram showed EF 25-30%, global hypokinesis, G1DD, moderately elevated PA pressures, moderate LAE, and normal structure/function of mitral valve s/p repair/replacement. GDMT has been limited by hypotension - she failed trial of metoprolol succinate and losartan. Subsequently started on jardiance. - Continue plavix 75mg  daily (was on previously due to suspected thromboembolic NSTEMI in the past) - Stop jardiance due to [facility] as above - Monitor daily weights - Will need repeat echocardiogram in 3 months to evaluate for improvement in LV function.  - Scheduled for hospital follow-up in the [facility] 11/06/20   #MVP c/b severe mitral regurgitation s/p bioprosthetic MVR in 2018:  Stable gradients on most recent TTE - Continue routine monitoring  #History of NSTEMI (non-ST elevated myocardial infarction)  Presented with chest pain in 2020. Cath without obstructive disease, however, MRI showed focal LGE in the mid inferolateral wall highly suspicious for thromboembolic event. TTE with LVEF was 60 to 65%. Completed cardiac rehab. Had mild troponin elevation with recent hospitalization with peak 140-->130. No anginal symptoms. Suspect  demand in the setting of TIA. Given recent negative cath and absence of chest pain, no further work-up needed at this time -Continue plavix 75mg  daily -Continue crestor 20mg  daily -Off BB due to orthostatsis -Stopped linisopril due to orthostasis   #Carotid artery disease s/p right CEA 07/2020: followed by [name] outpatient - Continue routine monitoring - Continue plavix 75mg  daily and crestor 20mg  daily as above   #Iron deficiency: patient found to have low ferritin levels during last admission. Given feraheme x1 dose and transitioned to po iron supplement at discharge - Continue po iron - Follow-up with PCP for further management.     #UIP: follows outpatient with pulmonology. Does have some crackles on exam though no complaints of SOB. - Continue home trelegy - Continue to follow with pulmonology  #Hyperlipidemia LDL goal <70 LDL optimal on most recent lab work with LDL 63   -Change to crestor 20mg  daily as above  Okay to discharge home tonight. We have very close clinic follow-up. Plan discussed at length with the patient and her daughter.    For questions or updates, please contact [place] Please consult [email or web address] for contact info under    Signed, Freada Bergeron, MD  11/03/2020 4:16 PM

## 2020-11-03 NOTE — Telephone Encounter (Signed)
-----   Message from Freada Bergeron, MD sent at 11/03/2020  5:39 PM EDT ----- Just saw her in the ER. She is seeing me in like 10 days. I think she has HF follow-up, but if not, can we get her in with [facility]?

## 2020-11-03 NOTE — ED Provider Notes (Signed)
Care transferred to me. Patient feels well. Cardiology has seen and recommends discharge and stopping Jardiance. Given return precautions.   Sherwood Gambler, MD 11/03/20 1719

## 2020-11-03 NOTE — Discharge Instructions (Addendum)
If you develop recurrent, continued, or worsening chest pain, shortness of breath, fever, vomiting, abdominal or back pain, or any other new/concerning symptoms then return to the ER for evaluation.  

## 2020-11-03 NOTE — ED Provider Notes (Signed)
[facility] EMERGENCY DEPARTMENT Provider Note   CSN: [id number] Arrival date & time: 11/03/20  1251     History Chief Complaint  Patient presents with   Chest Pain   Nausea    CP and nausea, no SOB, exact time it started not known, was here 5 days ago with new dx of cardiomyopathy, nml EKG per EMS, told EMS that she was sitting when CP started and family told pt that she never became sweaty    Joy Williams is a 70 y.o. female.  The history is provided by the patient and medical records.  Chest Pain Joy Williams is a 70 y.o. female who presents to the Emergency Department complaining of chest pain. She presents the emergency department by EMS for evaluation of recurrent chest pain. She states that she was recently discharged from the hospital and since that time has experienced recurrent episodes of ortho stasis. Today the episodes were much more severe. She also developed nausea and substernal chest pain with the last episode. Overall the chest pain is improving but she does have some persistent pressure. She feels very hot and clammy but does not have a fever. No abdominal pain, dysuria, leg swelling. She is compliant with all her medications.    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   Complication of anesthesia    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 [facility] (had to have botox injection)   COPD (chronic obstructive pulmonary disease) ([place])    Family history of adverse reaction to anesthesia    Mother- very sensitive to medication   Heart murmur    MVP   Hypothyroidism    Lung cancer ([place]) 12/30/2014   Synchronous primaries:  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, each treated by wedge resection with post-op adjuvant chemoRx at  [facility]   MVP (mitral valve prolapse) 2018   s/p Minimally-Invasive Mitral Valve Replacement w/  [facility] Mitral bovine bioprosthetic tissue valve (size 65mm, model # 7300TFX, serial # [id number])   PONV (postoperative nausea and vomiting)    Raynaud's syndrome    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   31 mm [facility] mitral bovine bioprosthetic tissue valve placed via right mini thoracotomy approach   Severe mitral regurgitation 11/15/2014   Shortness of breath dyspnea    with exertion   STD (sexually transmitted disease)    STEMI (ST elevation myocardial infarction) ([place]) 10/24/2020   Telangiectasia     Patient Active Problem List   Diagnosis Date Noted   Acute systolic CHF (congestive heart failure) ([place]) 10/25/2020   STEMI (ST elevation myocardial infarction) ([place]) 10/24/2020   Precordial chest pain 10/24/2020   Takotsubo cardiomyopathy    Closed fracture of phalanx of foot 08/17/2020   Symptomatic stenosis of right carotid artery 08/08/2020   TIA (transient ischemic attack) 08/06/2020   Anemia 08/06/2020   Encounter for counseling 02/08/2020   Suppression of immune system - subtherapeutic ([place]) 07/14/2019   History of TIA (transient ischemic attack) 05/10/2019   Non-ST elevated myocardial infarction ([place]) 02/25/2019   NSTEMI (non-ST elevated myocardial infarction) ([place]) 02/25/2019   Elevated troponin level not due to acute coronary syndrome  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   On statin therapy 04/27/2018   Memory loss or impairment 85/88/5027   Complicated migraine 74/04/8785   Hyperlipidemia LDL goal <70 03/12/2018   History of RPR test, 9/18 felt to be false positive 03/12/2018   Stroke-like episode ([place]) s/p tPA 03/11/2018   Dysuria 10/14/2017   Abnormal finding on urinalysis 10/14/2017   Pharyngitis 10/14/2017   Closed nondisplaced fracture of base of fifth metacarpal bone of right hand 05/27/2017    Decreased range of motion of right ankle 05/27/2017   Pain and swelling of right wrist 05/27/2017   Age-related osteoporosis without current pathological fracture 02/20/2017   ASCUS of cervix with negative high risk HPV 01/29/2017   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   Tobacco abuse 01/22/2017   Tobacco abuse counseling 01/22/2017   Neoplastic malignant related fatigue 01/22/2017   Vitamin D insufficiency 01/22/2017   Estrogen deficiency 01/14/2017   H/O cardiac catheterization- in prep for lung ca sx 01/06/2017   Hypothyroidism 01/06/2017   Previous back surgeries 01/06/2017   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   Chronic diastolic congestive heart failure (HCC)    Chronic obstructive pulmonary disease (HCC)    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   Antineoplastic chemotherapy induced anemia 05/05/2015   History of epistaxis 03/30/2015   Raynaud's disease 01/19/2015   Malignant neoplasm of left lung ([place]) 12/30/2014   Lung cancer ([place]) 12/30/2014   Malignant neoplasm of overlapping sites of left lung ([place]) 12/06/2014   Nonintractable episodic headache 12/06/2014   Severe mitral regurgitation 11/15/2014   Scleroderma ([place]) 10/03/2014   Asthmatic bronchitis , chronic ([place]) 10/03/2014   Multiple pulmonary nodules 10/03/2014   Current Cigarette smoker 10/03/2014    Past Surgical History:  Procedure Laterality Date   BACK SURGERY     x 3  Disectomy   BREAST BIOPSY Left    CARDIAC CATHETERIZATION N/A 03/08/2016   Procedure: Right/Left Heart Cath and Coronary Angiography;  Surgeon: Sherren Mocha, MD;  Location: [facility];  Service: Cardiovascular;  Laterality: N/A;   CLAVICLE SURGERY Left 2013   plate to left collar bone   ENDARTERECTOMY Right 08/09/2020   Procedure: RIGHT CAROTID ENDARTERECTOMY;  Surgeon: Waynetta Sandy, MD;  Location: [facility];  Service: Vascular;  Laterality: Right;   LAPAROSCOPY     ? reason-age 84    LEFT HEART CATH AND CORONARY ANGIOGRAPHY N/A 02/25/2019   Procedure: LEFT HEART CATH AND CORONARY ANGIOGRAPHY;  Surgeon: Jettie Booze, MD;  Location: [facility];  Service: Cardiovascular;  Laterality: N/A;   LEFT HEART CATH AND CORONARY ANGIOGRAPHY N/A 10/24/2020   Procedure: LEFT HEART CATH AND CORONARY ANGIOGRAPHY;  Surgeon: Jettie Booze, MD;  Location: [facility];  Service: Cardiovascular;  Laterality: N/A;   LUNG CANCER SURGERY     MITRAL VALVE REPAIR Right 06/27/2016   Procedure: MINIMALLY INVASIVE MITRAL VALVE REPLACEMENT;  Surgeon: Rexene Alberts, MD;  Location: [facility];  Service: Open Heart Surgery;  Laterality: Right;   TEE WITHOUT CARDIOVERSION N/A 02/22/2016   Procedure: TRANSESOPHAGEAL ECHOCARDIOGRAM (TEE);  Surgeon: Dorothy Spark, MD;  Location: [facility];  Service: Cardiovascular;  Laterality: N/A;   TEE WITHOUT CARDIOVERSION N/A 06/27/2016   Procedure: TRANSESOPHAGEAL ECHOCARDIOGRAM (TEE);  Surgeon: Rexene Alberts, MD;  Location: [facility];  Service: Open Heart Surgery;  Laterality: N/A;   TONSILLECTOMY  VIDEO ASSISTED THORACOSCOPY (VATS)/WEDGE RESECTION Left 12/30/2014   Bronchoscopy, Mediastinoscopy, Left VATS for Wedge resection LUL x2 adn LLL x1 - Dr. Elenor Quinones at [facility] Left 2017   injected with botox   wrist surgery Left 2015   plate to wrist      OB History     Gravida  3   Para  2   Term  2   Preterm      AB  1   Living  2      SAB      IAB      Ectopic      Multiple      Live Births  2           Family History  Problem Relation Age of Onset   Mitral valve prolapse Mother    Dementia Father    Prostate cancer Father    Mitral valve prolapse Brother    Mitral valve prolapse Sister    Colon cancer Neg Hx    Colon polyps Neg Hx    Rectal cancer Neg Hx    Esophageal cancer Neg Hx    Stomach cancer Neg Hx     Heart attack Neg Hx     Social History   Tobacco Use   Smoking status: Some Days    Packs/day: 1.00    Years: 24.00    Pack years: 24.00    Types: E-cigarettes, Cigarettes    Start date: 05/21/1983   Smokeless tobacco: Never   Tobacco comments:    currently smoking 3cigs per day as of 10/12/20  Vaping Use   Vaping Use: Former   Quit date: 05/20/2006  Substance Use Topics   Alcohol use: Yes    Alcohol/week: 2.0 standard drinks    Types: 1 Shots of liquor, 1 Standard drinks or equivalent per week   Drug use: No    Home Medications Prior to Admission medications   Medication Sig Start Date End Date Taking? Authorizing Provider  acetaminophen (TYLENOL) 500 MG tablet Take 1,000 mg by mouth every 6 (six) hours as needed for mild pain or headache.    [provider]  clopidogrel (PLAVIX) 75 MG tablet Take 1 tablet (75 mg total) by mouth daily. 10/29/20   Kroeger, Lorelee Cover., PA-C  empagliflozin (JARDIANCE) 10 MG TABS tablet Take 1 tablet (10 mg total) by mouth daily. 10/29/20   Kroeger, Lorelee Cover., PA-C  ferrous sulfate 325 (65 FE) MG tablet Take 1 tablet (325 mg total) by mouth daily. 10/29/20 10/29/21  Kroeger, Lorelee Cover., PA-C  Fluticasone-Umeclidin-Vilant (TRELEGY ELLIPTA) 100-62.5-25 MCG/INH AEPB TAKE 1 PUFF BY MOUTH EVERY DAY 09/13/20   Tanda Rockers, MD  levothyroxine (SYNTHROID) 175 MCG tablet TAKE 1 TABLET BY MOUTH DAILY BEFORE BREAKFAST 06/16/20   Abonza, Maritza, PA-C  MELATONIN PO Take 1 tablet by mouth at bedtime as needed (sleep).    [provider]  Multiple Vitamins-Minerals (MULTIVITAMIN WITH MINERALS) tablet Take 1 tablet by mouth daily.    [provider]  nicotine (NICODERM CQ - DOSED IN MG/24 HR) 7 mg/24hr patch Place 1 patch (7 mg total) onto the skin daily. 10/29/20   Kroeger, Lorelee Cover., PA-C  rosuvastatin (CRESTOR) 20 MG tablet Take 1 tablet (20 mg total) by mouth daily. 08/15/20   Freada Bergeron, MD  Turmeric 500 MG CAPS Take 500 mg by  mouth daily.    [provider]  vitamin B-12 (CYANOCOBALAMIN) 1000 MCG tablet Take  1 tablet (1,000 mcg total) by mouth daily. 08/10/20   Geradine Girt, DO    Allergies    Penicillins  Review of Systems   Review of Systems  Cardiovascular:  Positive for chest pain.  All other systems reviewed and are negative.  Physical Exam Updated Vital Signs Ht 5' 6.5" (1.689 m)   Wt 60.8 kg   SpO2 98%   BMI 21.30 kg/m   Physical Exam Vitals and nursing note reviewed.  Constitutional:      Appearance: She is well-developed.  HENT:     Head: Normocephalic and atraumatic.  Cardiovascular:     Rate and Rhythm: Normal rate and regular rhythm.     Heart sounds: No murmur heard. Pulmonary:     Effort: Pulmonary effort is normal. No respiratory distress.     Breath sounds: Normal breath sounds.  Abdominal:     Palpations: Abdomen is soft.     Tenderness: There is no abdominal tenderness. There is no guarding or rebound.  Musculoskeletal:        General: No swelling or tenderness.  Skin:    General: Skin is warm and dry.  Neurological:     Mental Status: She is alert and oriented to person, place, and time.  Psychiatric:        Behavior: Behavior normal.    ED Results / Procedures / Treatments   Labs (all labs ordered are listed, but only abnormal results are displayed) Labs Reviewed  COMPREHENSIVE METABOLIC PANEL  CBC WITH DIFFERENTIAL/PLATELET  URINALYSIS, ROUTINE W REFLEX MICROSCOPIC  TROPONIN I (HIGH SENSITIVITY)    EKG None  Radiology No results found.  Procedures Procedures   Medications Ordered in ED Medications - No data to display  ED Course  I have reviewed the triage vital signs and the nursing notes.  Pertinent labs & imaging results that were available during my care of the patient were reviewed by me and considered in my medical decision making (see chart for details).    MDM Rules/Calculators/A&P                         Patient with recent  admission for STEMI due to takotsubo cardiomyopathy here for evaluation of multiple episodes of ortho stasis with lightheadedness as well as chest pressure today. EKG is without acute ischemic changes. Troponin is improved compared to recent hospitalization. Cardiology consulted for further recommendations.  Final Clinical Impression(s) / ED Diagnoses Final diagnoses:  None    Rx / DC Orders ED Discharge Orders     None        Quintella Reichert, MD 11/03/20 1549

## 2020-11-04 ENCOUNTER — Ambulatory Visit: Payer: Medicare Other

## 2020-11-05 NOTE — Progress Notes (Addendum)
[facility]  PCP: Lorrene Reid Primary Cardiologist: Gwyndolyn Kaufman  HPI:  Joy Williams is a 70 y.o.  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    2018 underwent minimally invasive bioprosthetic MVR for symptomatic MVP Barlow syndrome.  Preop workup LHC with normal coronaries, RHC with normal CO and filling pressures.    02/2019 She presented with NSTEMI. HS troponin peaked at 1600. Taken for [facility] which demonstrated no CAD.  This was followed by cardiac MRI which demonstrated focal LGE of the left circumflex territory compatible with ischemia.    10/24/2020 called EMS for unrelenting chest pain, ecg by ems showed clear ST elevations in leads I and aVL. Troponin up to >6000.  She was taken emergently for LHC however non obstructive disease seen on cath felt to be consistent with takotsubo cardiomyopathy.  ECHO 10/2020 with EF 25-30%, G1DD, moderately elevated PASP, normal RV function. All ECHO studies prior to this admission had normal EF.    11/03/20 seen in the ED for dizziness, light headedness. Troponin 40 x2, ecg without ischemia. Cardiology consulted and jardiance was d/ced.    She denies any mild chest pain in between her episode in 2020 and her recent episode.  Main issue she has is recurrent dizziness and light headedness.    Taking her bp since her ED visit it has improved and maintains around 115/70.  Her dizziness has also improved.  Breathing has been doing well.  Took the dog for a short walk yesterday did not get short of breath, no chest pain.     ROS: All systems negative except as  listed in HPI, PMH and Problem List.  SH:  Social History   Socioeconomic History   Marital status: Divorced    Spouse name: Not on file   Number of children: Not on file   Years of education: 16   Highest education level: Not on file  Occupational History   Occupation: Retired  Tobacco Use   Smoking status: Former    Packs/day: 1.00    Years: 24.00    Pack years: 24.00    Types: E-cigarettes, Cigarettes    Start date: 05/21/1983   Smokeless tobacco: Never   Tobacco comments:    Stopped smoking 10/12/2020  Vaping Use   Vaping Use: Former   Quit date: 05/20/2006  Substance and Sexual Activity   Alcohol use: Yes    Alcohol/week: 2.0 standard drinks    Types: 1 Shots of liquor, 1 Standard drinks or equivalent per week   Drug use: No   Sexual activity: Not Currently    Partners: Male    Birth control/protection: Post-menopausal  Other Topics Concern   Not on file  Social History Narrative   Not on file   Social Determinants of Health   Financial Resource Strain: Low Risk    Difficulty of Paying Living Expenses: Not very hard  Food Insecurity: No Food Insecurity   Worried About Charity fundraiser in the Last Year: Never true   Ran Out of Food in the Last Year: Never true  Transportation Needs: Unmet Transportation Needs   Lack of Transportation (Medical): No  Lack of Transportation (Non-Medical): Yes  Physical Activity: Not on file  Stress: Not on file  Social Connections: Not on file  Intimate Partner Violence: Not on file    FH:  Family History  Problem Relation Age of Onset   Mitral valve prolapse Mother    Dementia Father    Prostate cancer Father    Mitral valve prolapse Brother    Mitral valve prolapse Sister    Colon cancer Neg Hx    Colon polyps Neg Hx    Rectal cancer Neg Hx    Esophageal cancer Neg Hx    Stomach cancer Neg Hx    Heart attack Neg Hx     Past Medical History:  Diagnosis Date   Allergy    Asthma    pt denies this, but is on  Dulera   Carotid artery disease ([place]) 2019   1-39% stenosis by Dopplers    Chronic diastolic congestive heart failure (HCC)    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[place]) 3/0/8657   Complication of anesthesia    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 [facility] (had to have botox injection)   COPD (chronic obstructive pulmonary disease) ([place])    Family history of adverse reaction to anesthesia    Mother- very sensitive to medication   Heart murmur    MVP   Hypothyroidism    Lung cancer ([place]) 12/30/2014   Synchronous primaries:  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, each treated by wedge resection with post-op adjuvant chemoRx at [facility]   MVP (mitral valve prolapse) 2018   s/p Minimally-Invasive Mitral Valve Replacement w/  [facility] Mitral bovine bioprosthetic tissue valve (size 8mm, model # 7300TFX, serial # [id number])   PONV (postoperative nausea and vomiting)    Raynaud's syndrome    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   31 mm [facility] mitral bovine bioprosthetic tissue valve placed via right mini thoracotomy approach   Severe mitral regurgitation 11/15/2014   Shortness of breath dyspnea    with exertion   STD (sexually transmitted disease)    STEMI (ST elevation myocardial infarction) ([place]) 10/24/2020   Telangiectasia     Current Outpatient Medications  Medication Sig Dispense Refill   acetaminophen (TYLENOL) 500 MG tablet Take 1,000 mg by mouth every 6 (six) hours as needed for mild pain or headache.     clopidogrel (PLAVIX) 75 MG tablet Take 1 tablet (75 mg total) by mouth daily. 90 tablet 3   ferrous sulfate 325 (65 FE) MG tablet Take 1 tablet (325 mg total) by mouth daily. 30 tablet 6   Fluticasone-Umeclidin-Vilant (TRELEGY ELLIPTA) 100-62.5-25 MCG/INH AEPB TAKE 1 PUFF BY MOUTH EVERY DAY 60 each 11   levothyroxine (SYNTHROID) 175 MCG tablet TAKE 1 TABLET BY MOUTH DAILY BEFORE BREAKFAST 90 tablet 1   MELATONIN PO Take 1 tablet by mouth  at bedtime as needed (sleep).     Multiple Vitamins-Minerals (MULTIVITAMIN WITH MINERALS) tablet Take 1 tablet by mouth daily.     nicotine (NICODERM CQ - DOSED IN MG/24 HR) 7 mg/24hr patch Place 1 patch (7 mg total) onto the skin daily. 28 patch 0   rosuvastatin (CRESTOR) 20 MG tablet Take 1 tablet (20 mg total) by mouth daily. 90 tablet 3   Turmeric 500 MG CAPS Take 500 mg by mouth daily.     vitamin B-12 (CYANOCOBALAMIN) 1000 MCG tablet Take 1 tablet (1,000 mcg total) by mouth daily.     No  current facility-administered medications for this encounter.    Vitals:   11/06/20 1314  BP: 116/68  Weight: 62.9 kg (138 lb 9.6 oz)    PHYSICAL EXAM: Cardiac: JVD flat, normal rate and rhythm, clear s1 and s2, no murmurs, rubs or gallops, no LE edema Pulmonary: dry crackles lower lung fields otherwise clear, not in distress Abdominal: non distended abdomen, soft and nontender Psych: Alert, conversant, anxious  POCUS: LV EF 55-60%, normal RV function, normally functioning bioprosthetic mitral valve  ASSESSMENT & PLAN:  Systolic CHF: Takotsubo Cardiomyopathy: -nicm, 10/24/2020 LHC  non obstructive disease, LV gram consistent with takotsubo cardiomyopathy.   -ECHO 10/2020 with EF 25-30%, G1DD, moderately elevated PASP, normal RV function -NYHA Class I, euvolemic on exam -Could not tolerate GDMT due to symptomatic hypotension -Bedside echo today with complete recovery of EF -no med changes  Scleroderma: -Has an appointment to establish with rheumatology in [place], Dr. Estanislado Pandy -Followed by Dr. Chase Caller for pulmonary involvement  Cartoid Artery Stenosis: Hx TIA: -s/p R CEA -continue plavix, statin  Tobacco Use Disorder: -quit 10/12/20, does not plan to restart, has nicotine patches   Follow up with Dr. Grover Canavan, MD  Patient seen and examined with the above-signed Advanced Practice Provider and/or Housestaff. I personally reviewed laboratory data, imaging  studies and relevant notes. I independently examined the patient and formulated the important aspects of the plan. I have edited the note to reflect any of my changes or salient points. I have personally discussed the plan with the patient and/or family.  70 y/o female smoker with scleroderma, UIP admitted, MVP s/p previous MVR earlier this month with NSTEMI. Found to have Tako-tsubo CM. Did not tolerated GDMT due to low BP. Feeling better but still concerned about her heart. No edema, orthopnea or PND  General:  Mildly anxious. No resp difficulty HEENT: normal Neck: supple. no JVD. Carotids 2+ bilat; no bruits. No lymphadenopathy or thryomegaly appreciated. Cor: PMI nondisplaced. Regular rate & rhythm. No rubs, gallops or murmurs. Lungs: clear but decreased throughout  Abdomen: soft, nontender, nondistended. No hepatosplenomegaly. No bruits or masses. Good bowel sounds. Extremities: no clubbing, rash, edema. Diffuse cyanosis due to Raynaud's Neuro: alert & orientedx3, cranial nerves grossly intact. moves all 4 extremities w/o difficulty. Affect pleasant  Bedside echo today in clinic EF 55-60% with complete recovery of her tako-tsubo CM. I reassured her that is is unlikely to recur. We will have her f/u with Dr. Johney Frame as scheduled.  Glori Bickers, MD  2:56 PM

## 2020-11-06 ENCOUNTER — Ambulatory Visit (HOSPITAL_COMMUNITY)
Admit: 2020-11-06 | Discharge: 2020-11-06 | Disposition: A | Discharge status: Home / Self Care | Payer: Medicare Other | Attending: Internal Medicine | Admitting: Internal Medicine

## 2020-11-06 ENCOUNTER — Encounter (HOSPITAL_COMMUNITY): Payer: Self-pay

## 2020-11-06 ENCOUNTER — Other Ambulatory Visit: Payer: Self-pay

## 2020-11-06 VITALS — BP 116/68 | Wt 138.6 lb

## 2020-11-06 DIAGNOSIS — I771 Stricture of artery: Secondary | ICD-10-CM | POA: Diagnosis not present

## 2020-11-06 DIAGNOSIS — I428 Other cardiomyopathies: Secondary | ICD-10-CM | POA: Insufficient documentation

## 2020-11-06 DIAGNOSIS — M341 CR(E)ST syndrome: Secondary | ICD-10-CM | POA: Insufficient documentation

## 2020-11-06 DIAGNOSIS — Z87891 Personal history of nicotine dependence: Secondary | ICD-10-CM | POA: Diagnosis not present

## 2020-11-06 DIAGNOSIS — Z7902 Long term (current) use of antithrombotics/antiplatelets: Secondary | ICD-10-CM | POA: Diagnosis not present

## 2020-11-06 DIAGNOSIS — I341 Nonrheumatic mitral (valve) prolapse: Secondary | ICD-10-CM | POA: Insufficient documentation

## 2020-11-06 DIAGNOSIS — I214 Non-ST elevation (NSTEMI) myocardial infarction: Secondary | ICD-10-CM

## 2020-11-06 DIAGNOSIS — I5042 Chronic combined systolic (congestive) and diastolic (congestive) heart failure: Secondary | ICD-10-CM | POA: Insufficient documentation

## 2020-11-06 DIAGNOSIS — Z952 Presence of prosthetic heart valve: Secondary | ICD-10-CM | POA: Diagnosis not present

## 2020-11-06 DIAGNOSIS — Z8673 Personal history of transient ischemic attack (TIA), and cerebral infarction without residual deficits: Secondary | ICD-10-CM | POA: Diagnosis not present

## 2020-11-06 DIAGNOSIS — Z79899 Other long term (current) drug therapy: Secondary | ICD-10-CM | POA: Diagnosis not present

## 2020-11-06 DIAGNOSIS — I5181 Takotsubo syndrome: Secondary | ICD-10-CM | POA: Diagnosis not present

## 2020-11-06 DIAGNOSIS — Z953 Presence of xenogenic heart valve: Secondary | ICD-10-CM | POA: Diagnosis not present

## 2020-11-06 DIAGNOSIS — I5020 Unspecified systolic (congestive) heart failure: Secondary | ICD-10-CM | POA: Diagnosis present

## 2020-11-06 DIAGNOSIS — I252 Old myocardial infarction: Secondary | ICD-10-CM | POA: Insufficient documentation

## 2020-11-06 DIAGNOSIS — J449 Chronic obstructive pulmonary disease, unspecified: Secondary | ICD-10-CM | POA: Insufficient documentation

## 2020-11-06 NOTE — Patient Instructions (Signed)
It was great to see you today! No medication changes are needed at this time.  Keep follow up as scheduled with Dr Johney Frame     Do the following things EVERYDAY: Weigh yourself in the morning before breakfast. Write it down and keep it in a log. Take your medicines as prescribed Eat low salt foods--Limit salt (sodium) to 2000 mg per day.  Stay as active as you can everyday Limit all fluids for the day to less than 2 liters    At the [facility], you and your health needs are our priority. As part of our continuing mission to provide you with exceptional heart care, we have created designated Provider Care Teams. These Care Teams include your primary Cardiologist (physician) and Advanced Practice Providers (APPs- Physician Assistants and Nurse Practitioners) who all work together to provide you with the care you need, when you need it.   You may see any of the following providers on your designated Care Team at your next follow up: Dr Glori Bickers Dr Loralie Champagne Dr Patrice Paradise, NP Lyda Jester, [place] Ginnie Smart Audry Riles, PharmD   Please be sure to bring in all your medications bottles to every appointment.

## 2020-11-07 DIAGNOSIS — R072 Precordial pain: Secondary | ICD-10-CM | POA: Diagnosis not present

## 2020-11-07 DIAGNOSIS — I5021 Acute systolic (congestive) heart failure: Secondary | ICD-10-CM | POA: Diagnosis not present

## 2020-11-07 DIAGNOSIS — Z09 Encounter for follow-up examination after completed treatment for conditions other than malignant neoplasm: Secondary | ICD-10-CM | POA: Diagnosis not present

## 2020-11-07 DIAGNOSIS — I213 ST elevation (STEMI) myocardial infarction of unspecified site: Secondary | ICD-10-CM | POA: Diagnosis not present

## 2020-11-07 DIAGNOSIS — G459 Transient cerebral ischemic attack, unspecified: Secondary | ICD-10-CM | POA: Diagnosis not present

## 2020-11-07 DIAGNOSIS — Z20822 Contact with and (suspected) exposure to covid-19: Secondary | ICD-10-CM | POA: Diagnosis not present

## 2020-11-07 DIAGNOSIS — I5181 Takotsubo syndrome: Secondary | ICD-10-CM | POA: Diagnosis not present

## 2020-11-12 NOTE — Progress Notes (Signed)
ANA  stronglyh positive. Glad there is appt with Deveshwar. Plan  - giver her followu with me in July - Sept per Mayc 2022 ov plan

## 2020-11-14 DIAGNOSIS — Z09 Encounter for follow-up examination after completed treatment for conditions other than malignant neoplasm: Secondary | ICD-10-CM | POA: Diagnosis not present

## 2020-11-14 DIAGNOSIS — R072 Precordial pain: Secondary | ICD-10-CM | POA: Diagnosis not present

## 2020-11-14 DIAGNOSIS — I5181 Takotsubo syndrome: Secondary | ICD-10-CM | POA: Diagnosis not present

## 2020-11-14 DIAGNOSIS — G459 Transient cerebral ischemic attack, unspecified: Secondary | ICD-10-CM | POA: Diagnosis not present

## 2020-11-14 DIAGNOSIS — I5021 Acute systolic (congestive) heart failure: Secondary | ICD-10-CM | POA: Diagnosis not present

## 2020-11-14 DIAGNOSIS — I213 ST elevation (STEMI) myocardial infarction of unspecified site: Secondary | ICD-10-CM | POA: Diagnosis not present

## 2020-11-14 NOTE — Progress Notes (Signed)
Cardiology Office Note:    Date:  11/15/2020   ID:  ESSANCE GATTI, DOB 12-Mar-1951, MRN [id number]  PCP:  Lorrene Reid, PA-C   [facility] Providers Cardiologist:  Freada Bergeron, MD {   Referring MD: Lorrene Reid, PA-C    History of Present Illness:    Joy Williams is a 70 y.o. female with a hx of MVP s/p minimally invasive bioprosthetic MVR, carotid artery disease s/p R CEA, scleroderma, cold agglutinin disease, raynaud's disease, UIP, NSCLC, COPD, hypothyroidism and recent admission for STEMI found to have clean coronaries on cath with diagnosis of Takostubo CM discharged on 10/29/20 and recent readmission for dizziness on 11/03/20 who now presents to clinic for follow-up.  Patient was admitted 02/24/19-02/25/19 with chest discomfort.  High-sensitivity troponin levels were elevated (1471 >> 1625 >> 1246 >> 1150).  There was concern for NSTEMI., however, cath demonstrated no CAD.  TTE demonstrated normal LV function and normally functioning mitral valve prosthesis.  She was followed by Dr. Debara Pickett in the hospital and he felt that her presentation was c/w [facility] (myocardial infarction with non-obstructive coronary arteries) or non-ACS troponin elevation.  The possible causes included myocarditis, coronary spasm, cardioembolic event. Underwent cardiac MRI that showed focal subendocardial LGE into mid inferolateral wall consistent with infarct seen on territory with concern for a thromboembolic event given clean cath. She was managed medically and worked with cardiac rehab at that time.   In November of 2020, she developed palpitations. 4 week zio placed with brief runs of SVT but no significant arrhythmias or Afib   Re-admitted to [facility] from 08/06/20-08/10/20 where she presented for acute onset dizziness, left arm incoordination and HA. CT head negative for acute abnormalities. MRI brain without acute infarct but showed multiple scattered remote lacunar infarcts. 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 TTE with EF 65 to 70%, NWMA, mild LVH, moderate left atrial size dilated, mild MVR, no source of emboli noted. She underwent right CEA on 08/09/20 with [name]. She was continued on ASA, plavix and statin.  Re-admitted again from 10/24/20-10/29/20 where she presented with chest pain and nausea found to have STE in I and aVL for which a STEMI was called. She was taken to the cath lab which showed 25% pLAD stenosis, otherwise normal coronary arteries, and EF 25-35% with LV gram c/w takotsubo cardiomyopathy. She was started on plavix. TTE showed EF 25-30%, global hypokinesis, G1DD, moderately elevated PA pressures, moderate LAE, and normal structure/function of mitral valve s/p repair/replacement. GDMT was limited due to hypotension and she was discharged on 10/29/20.  Re-presented to the ER on 11/03/20 with dizziness and orthostatic symptoms. Trop 41-->44 which was down from prior admission. Symptoms thought to be due to orthostasis and farxiga discontinued.  Saw [facility] on 11/06/20 where she was doing better. Dizziness improved. Bedside echo with improved LVEF.  Today, the patient states the dizziness is improved. Continues to have intermittent episodes in the morning that resolves with drinking water/gatorade. No LE edema, chest pain, orthopnea, PND, palpitations. Overall, she feels much improved from her last admission.    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 [facility] (had to have botox injection)   COPD (chronic obstructive  pulmonary disease) ([place])    Family history of adverse reaction to anesthesia    Mother- very sensitive to medication   Heart murmur    MVP   Hypothyroidism    Lung cancer ([place]) 12/30/2014   Synchronous primaries:  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, each treated by wedge resection with post-op adjuvant chemoRx at [facility]   MVP (mitral valve prolapse) 2018   s/p Minimally-Invasive Mitral Valve Replacement w/  [facility] Mitral bovine bioprosthetic tissue valve (size 39mm, model # 7300TFX, serial # [id number])   PONV (postoperative nausea and vomiting)    Raynaud's syndrome    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   31 mm [facility] mitral bovine bioprosthetic tissue valve placed via right mini thoracotomy approach   Severe mitral regurgitation 11/15/2014   Shortness of breath dyspnea    with exertion   STD (sexually transmitted disease)    STEMI (ST elevation myocardial infarction) ([place]) 10/24/2020   Telangiectasia     Past Surgical History:  Procedure Laterality Date   BACK SURGERY     x 3  Disectomy   BREAST BIOPSY Left    CARDIAC CATHETERIZATION N/A 03/08/2016   Procedure: Right/Left Heart Cath and Coronary Angiography;  Surgeon: Sherren Mocha, MD;  Location: [place] [facility];  Service: Cardiovascular;  Laterality: N/A;   CLAVICLE SURGERY Left 2013   plate to left collar bone   ENDARTERECTOMY Right 08/09/2020   Procedure: RIGHT CAROTID ENDARTERECTOMY;  Surgeon: Waynetta Sandy, MD;  Location: [place];  Service: Vascular;  Laterality: Right;   LAPAROSCOPY     ? reason-age 69    LEFT HEART CATH AND CORONARY ANGIOGRAPHY N/A 02/25/2019   Procedure: LEFT HEART CATH AND CORONARY ANGIOGRAPHY;  Surgeon: Jettie Booze, MD;  Location: [place] [facility];  Service: Cardiovascular;  Laterality: N/A;   LEFT HEART CATH AND CORONARY ANGIOGRAPHY N/A 10/24/2020   Procedure: LEFT HEART CATH AND CORONARY ANGIOGRAPHY;   Surgeon: Jettie Booze, MD;  Location: [place] [facility];  Service: Cardiovascular;  Laterality: N/A;   LUNG CANCER SURGERY     MITRAL VALVE REPAIR Right 06/27/2016   Procedure: MINIMALLY INVASIVE MITRAL VALVE REPLACEMENT;  Surgeon: Rexene Alberts, MD;  Location: [place];  Service: Open Heart Surgery;  Laterality: Right;   TEE WITHOUT CARDIOVERSION N/A 02/22/2016   Procedure: TRANSESOPHAGEAL ECHOCARDIOGRAM (TEE);  Surgeon: Dorothy Spark, MD;  Location: [place];  Service: Cardiovascular;  Laterality: N/A;   TEE WITHOUT CARDIOVERSION N/A 06/27/2016   Procedure: TRANSESOPHAGEAL ECHOCARDIOGRAM (TEE);  Surgeon: Rexene Alberts, MD;  Location: [place];  Service: Open Heart Surgery;  Laterality: N/A;   TONSILLECTOMY     VIDEO ASSISTED THORACOSCOPY (VATS)/WEDGE RESECTION Left 12/30/2014   Bronchoscopy, Mediastinoscopy, Left VATS for Wedge resection LUL x2 adn LLL x1 - Dr. Elenor Quinones at [place] Left 2017   injected with botox   wrist surgery Left 2015   plate to wrist     Current Medications: Current Meds  Medication Sig   acetaminophen (TYLENOL) 500 MG tablet Take 1,000 mg by mouth every 6 (six) hours as needed for mild pain or headache.   clopidogrel (PLAVIX) 75 MG tablet Take  1 tablet (75 mg total) by mouth daily.   ferrous sulfate 325 (65 FE) MG tablet Take 1 tablet (325 mg total) by mouth daily.   Fluticasone-Umeclidin-Vilant (TRELEGY ELLIPTA) 100-62.5-25 MCG/INH AEPB TAKE 1 PUFF BY MOUTH EVERY DAY   levothyroxine (SYNTHROID) 175 MCG tablet TAKE 1 TABLET BY MOUTH DAILY BEFORE BREAKFAST   MELATONIN PO Take 1 tablet by mouth at bedtime as needed (sleep).   Multiple Vitamins-Minerals (MULTIVITAMIN WITH MINERALS) tablet Take 1 tablet by mouth daily.   nicotine (NICODERM CQ - DOSED IN MG/24 HR) 7 mg/24hr patch Place 1 patch (7 mg total) onto the skin daily.   rosuvastatin (CRESTOR) 20 MG tablet Take 1 tablet (20 mg total) by mouth daily.   Turmeric 500 MG CAPS Take 500  mg by mouth daily.   vitamin B-12 (CYANOCOBALAMIN) 1000 MCG tablet Take 1 tablet (1,000 mcg total) by mouth daily.     Allergies:   Penicillins   Social History   Socioeconomic History   Marital status: Divorced    Spouse name: Not on file   Number of children: Not on file   Years of education: 16   Highest education level: Not on file  Occupational History   Occupation: Retired  Tobacco Use   Smoking status: Former    Packs/day: 1.00    Years: 24.00    Pack years: 24.00    Types: E-cigarettes, Cigarettes    Start date: 05/21/1983   Smokeless tobacco: Never   Tobacco comments:    Stopped smoking 10/12/2020  Vaping Use   Vaping Use: Former   Quit date: 05/20/2006  Substance and Sexual Activity   Alcohol use: Yes    Alcohol/week: 2.0 standard drinks    Types: 1 Shots of liquor, 1 Standard drinks or equivalent per week   Drug use: No   Sexual activity: Not Currently    Partners: Male    Birth control/protection: Post-menopausal  Other Topics Concern   Not on file  Social History Narrative   Not on file   Social Determinants of Health   Financial Resource Strain: Low Risk    Difficulty of Paying Living Expenses: Not very hard  Food Insecurity: No Food Insecurity   Worried About Charity fundraiser in the Last Year: Never true   Arboriculturist in the Last Year: Never true  Transportation Needs: Unmet Transportation Needs   Lack of Transportation (Medical): No   Lack of Transportation (Non-Medical): Yes  Physical Activity: Not on file  Stress: Not on file  Social Connections: Not on file     Family History: The patient's family history includes Dementia in her father; Mitral valve prolapse in her brother, mother, and sister; Prostate cancer in her father. There is no history of Colon cancer, Colon polyps, Rectal cancer, Esophageal cancer, Stomach cancer, or Heart attack.  ROS:   Please see the history of present illness.    Review of Systems  Constitutional:   Negative for chills and fever.  HENT:  Negative for hearing loss.   Eyes:  Negative for blurred vision.  Respiratory:  Negative for shortness of breath.   Cardiovascular:  Negative for chest pain, palpitations, orthopnea, claudication, leg swelling and PND.  Gastrointestinal:  Negative for nausea and vomiting.  Genitourinary:  Negative for dysuria.  Musculoskeletal:  Negative for falls.  Neurological:  Positive for dizziness. Negative for loss of consciousness.  Psychiatric/Behavioral:  Negative for substance abuse.     EKGs/Labs/Other Studies Reviewed:    The  following studies were reviewed today: [facility] 02-Nov-2020: Prox LAD lesion is 25% stenosed. No significant CAD. The left ventricular systolic function is normal. LV end diastolic pressure is mildly elevated. The left ventricular ejection fraction is 25-35% by visual estimate. There is no aortic valve stenosis. Prior mitral valve surgery.   Despite lateral ST elevation, we only found nonobstructive coronary artery disease.  Left ventriculogram fits with a pattern of Takotsubo cardiomyopathy.  LV function significantly different from her prior catheterization.  She will need aggressive medical therapy for LV dysfunction.   I discussed the findings with her daughter.     Echocardiogram 10/25/20: 1. Left ventricular ejection fraction, by estimation, is 25 to 30%. The  left ventricle has severely decreased function. The left ventricle  demonstrates global hypokinesis. Left ventricular diastolic parameters are  consistent with Grade I diastolic  dysfunction (impaired relaxation).   2. Right ventricular systolic function is normal. The right ventricular  size is normal. There is moderately elevated pulmonary artery systolic  pressure. The estimated right ventricular systolic pressure is 47.4 mmHg.   3. Left atrial size was moderately dilated.   4. The mitral valve has been repaired/replaced. No evidence of mitral  valve regurgitation. No  evidence of mitral stenosis. Severe mitral annular  calcification. Echo findings are consistent with normal structure and  function of the mitral valve  prosthesis.   5. The aortic valve is normal in structure. Aortic valve regurgitation is  not visualized. No aortic stenosis is present.   6. The inferior vena cava is normal in size with greater than 50%  respiratory variability, suggesting right atrial pressure of 3 mmHg.   Comparison(s): Prior images unable to be directly viewed, comparison made  by report only. The left ventricular function is worsened.  EKG:  No new tracing.  Recent Labs: 10/25/2020: Magnesium 1.9; TSH 0.478 11/03/2020: ALT 18; BUN 11; Creatinine, Ser 0.67; Hemoglobin 12.2; Platelets 259; Potassium 4.2; Sodium 133  Recent Lipid Panel    Component Value Date/Time   CHOL 135 Nov 02, 2020 1545   CHOL 150 11/11/2018 0946   CHOL 177 10/28/2014 1025   TRIG 100 November 02, 2020 1545   TRIG 115 10/28/2014 1025   HDL 60 11-02-2020 1545   HDL 70 11/11/2018 0946   HDL 70 10/28/2014 1025   CHOLHDL 2.3 2020/11/02 1545   VLDL 20 11/02/20 1545   LDLCALC 55 November 02, 2020 1545   LDLCALC 64 11/11/2018 0946   LDLCALC 84 10/28/2014 1025   LDLDIRECT 63 06/15/2020 1446         Physical Exam:    VS:  BP 104/62   Pulse 67   Ht 5' 6.5" (1.689 m)   Wt 140 lb (63.5 kg)   SpO2 99%   BMI 22.26 kg/m     Wt Readings from Last 3 Encounters:  11/15/20 140 lb (63.5 kg)  11/06/20 138 lb 9.6 oz (62.9 kg)  11/03/20 134 lb (60.8 kg)     GEN: Comfortable, NAD HEENT: Normal NECK: No JVD; No carotid bruits CARDIAC: RRR, soft systolic murmur. No  rubs, gallops RESPIRATORY:  Coarse crackles at the bases (unchanged). Otherwise clear. ABDOMEN: Soft, non-tender, non-distended MUSCULOSKELETAL:  No edema; No deformity  SKIN: Warm and dry NEUROLOGIC:  Alert and oriented x 3 PSYCHIATRIC:  Normal affect   ASSESSMENT:    1. Takotsubo cardiomyopathy   2. S/P minimally invasive mitral valve  replacement with bioprosthetic valve   3. History of mitral valve replacement   4. NSTEMI (non-ST elevated myocardial infarction) ([place])  5. Bilateral carotid artery stenosis   6. Orthostatic hypertension   7. Hyperlipidemia LDL goal <70   8. Palpitations    PLAN:    In order of problems listed above:  #Non-Ischemic CM:  #Takotsubo cardiomyopathy:  Patient had a recent admission from 10/24/20-10/29/20 where she presented with chest pain, nausea, and vomiting and found to have anterolateral STE on EKG. HsTrop increased from 916>4990. She underwent emergent cardiac catheterization which showed 25% pLAD stenosis, otherwise normal coronary arteries, and EF 25-35% with LV gram c/w takotsubo cardiomyopathy. She was started on plavix for conservative management. TTE showed EF 25-30%, global hypokinesis, G1DD, moderately elevated PA pressures, moderate LAE, and normal structure/function of mitral valve s/p repair/replacement. GDMT has been limited by symptomatic hypotension. Per bedside TTE, appears to have improved LVEF. - Repeat limited TTE to assess LVEF - Unable to tolerate GDMT due to symptomatic hypotension - Continue plavix 75mg  daily (was on previously due to suspected thromboembolic NSTEMI in the past) - Monitor daily weights - Will refer to cardiac rehab   #MVP c/b severe mitral regurgitation s/p bioprosthetic MVR in 2018:  Stable gradients on most recent TTE - Continue routine monitoring   #History of NSTEMI (non-ST elevated myocardial infarction)  Presented with chest pain in 2020. Cath without obstructive disease, however, MRI showed focal LGE in the mid inferolateral wall highly suspicious for thromboembolic event. TTE with LVEF was 60 to 65%. Completed cardiac rehab. Had mild troponin elevation with recent hospitalization with peak 140-->130. No anginal symptoms. Suspect demand in the setting of TIA. Given recent negative cath and absence of chest pain, no further work-up needed at this  time -Continue plavix 75mg  daily -Continue crestor 20mg  daily -Off BB due to orthostatsis -Stopped linisopril due to orthostasis -Will refer to cardiac rehab  #Orthostatic Hypotension: Likely chronic orthostasis was worsened by initiation of jardiance as well as fluid and salt restriction after she was diagnosed with stress CM as detailed above. Now improved since stopping jardiance. -Compression socks -Elevate legs at end of the day -Slow position changes   #Carotid artery disease s/p right CEA 07/2020: followed by [name] outpatient - Continue routine monitoring - Continue plavix 75mg  daily and crestor 20mg  daily as above   #Iron deficiency: patient found to have low ferritin levels during last admission. Given feraheme x1 dose and transitioned to po iron supplement at discharge - Continue po iron - Follow-up with PCP for further management.     #UIP: follows outpatient with pulmonology. Does have some crackles on exam though no complaints of SOB. - Continue home trelegy - Continue to follow with pulmonology   #Hyperlipidemia LDL goal <70 LDL optimal on most recent lab work with LDL 63   -Change to crestor 20mg  daily as above  #Scleroderma: -Plans to establish care with Rheum for further work-up  #Tobacco Abuse: Quit successfully. -Continue nicotine patches        Medication Adjustments/Labs and Tests Ordered: Current medicines are reviewed at length with the patient today.  Concerns regarding medicines are outlined above.  Orders Placed This Encounter  Procedures   ECHOCARDIOGRAM LIMITED   No orders of the defined types were placed in this encounter.   Patient Instructions  Medication Instructions:   Your physician recommends that you continue on your current medications as directed. Please refer to the Current Medication list given to you today.  *If you need a refill on your cardiac medications before your next appointment, please call your  pharmacy*   Testing/Procedures:  Your physician has requested that you have an limited echocardiogram. Echocardiography is a painless test that uses sound waves to create images of your heart. It provides your doctor with information about the size and shape of your heart and how well your heart's chambers and valves are working. This procedure takes approximately one hour. There are no restrictions for this procedure.   Follow-Up:  3 MONTHS WITH AN EXTENDER IN THE OFFICE    Signed, Freada Bergeron, MD  11/15/2020 2:04 PM

## 2020-11-15 ENCOUNTER — Ambulatory Visit (INDEPENDENT_AMBULATORY_CARE_PROVIDER_SITE_OTHER): Payer: Medicare Other | Admitting: Cardiology

## 2020-11-15 ENCOUNTER — Encounter (HOSPITAL_BASED_OUTPATIENT_CLINIC_OR_DEPARTMENT_OTHER): Payer: Self-pay | Admitting: Cardiology

## 2020-11-15 ENCOUNTER — Other Ambulatory Visit: Payer: Self-pay

## 2020-11-15 VITALS — BP 104/62 | HR 67 | Ht 66.5 in | Wt 140.0 lb

## 2020-11-15 DIAGNOSIS — Z952 Presence of prosthetic heart valve: Secondary | ICD-10-CM

## 2020-11-15 DIAGNOSIS — R002 Palpitations: Secondary | ICD-10-CM | POA: Diagnosis not present

## 2020-11-15 DIAGNOSIS — E785 Hyperlipidemia, unspecified: Secondary | ICD-10-CM

## 2020-11-15 DIAGNOSIS — I214 Non-ST elevation (NSTEMI) myocardial infarction: Secondary | ICD-10-CM | POA: Diagnosis not present

## 2020-11-15 DIAGNOSIS — Z953 Presence of xenogenic heart valve: Secondary | ICD-10-CM

## 2020-11-15 DIAGNOSIS — I5181 Takotsubo syndrome: Secondary | ICD-10-CM

## 2020-11-15 DIAGNOSIS — I6523 Occlusion and stenosis of bilateral carotid arteries: Secondary | ICD-10-CM

## 2020-11-15 DIAGNOSIS — I10 Essential (primary) hypertension: Secondary | ICD-10-CM

## 2020-11-15 NOTE — Patient Instructions (Signed)
Medication Instructions:   Your physician recommends that you continue on your current medications as directed. Please refer to the Current Medication list given to you today.  *If you need a refill on your cardiac medications before your next appointment, please call your pharmacy*   Testing/Procedures:  Your physician has requested that you have an limited echocardiogram. Echocardiography is a painless test that uses sound waves to create images of your heart. It provides your doctor with information about the size and shape of your heart and how well your heart's chambers and valves are working. This procedure takes approximately one hour. There are no restrictions for this procedure.   Follow-Up:  3 MONTHS WITH AN EXTENDER IN THE OFFICE

## 2020-11-15 NOTE — Telephone Encounter (Signed)
Called and spoke with pt in regards to her mychart message. Stated to her that we should schedule f/u with MR after she sees rheumatology so he can have that OV to review when she comes for f/u with him. Pt verbalized understanding.  F/u has been scheduled for pt with MR having PFT prior and pt knows to bring covid card with her to appt. Nothing further needed.

## 2020-11-16 ENCOUNTER — Encounter: Payer: Self-pay | Admitting: *Deleted

## 2020-11-22 ENCOUNTER — Ambulatory Visit (HOSPITAL_COMMUNITY): Payer: Medicare Other | Attending: Interventional Cardiology

## 2020-11-22 ENCOUNTER — Other Ambulatory Visit: Payer: Self-pay

## 2020-11-22 DIAGNOSIS — Z953 Presence of xenogenic heart valve: Secondary | ICD-10-CM | POA: Diagnosis not present

## 2020-11-22 DIAGNOSIS — Z952 Presence of prosthetic heart valve: Secondary | ICD-10-CM | POA: Insufficient documentation

## 2020-11-22 LAB — ECHOCARDIOGRAM LIMITED
Area-P 1/2: 3.53 cm2
MV VTI: 2.27 cm2
S' Lateral: 2.5 cm

## 2020-11-30 ENCOUNTER — Ambulatory Visit (INDEPENDENT_AMBULATORY_CARE_PROVIDER_SITE_OTHER): Payer: Medicare Other | Admitting: Physician Assistant

## 2020-11-30 ENCOUNTER — Other Ambulatory Visit: Payer: Self-pay

## 2020-11-30 ENCOUNTER — Encounter: Payer: Self-pay | Admitting: Physician Assistant

## 2020-11-30 VITALS — BP 88/49 | HR 77 | Temp 98.7°F | Ht 66.5 in | Wt 136.1 lb

## 2020-11-30 DIAGNOSIS — J029 Acute pharyngitis, unspecified: Secondary | ICD-10-CM | POA: Diagnosis not present

## 2020-11-30 DIAGNOSIS — I214 Non-ST elevation (NSTEMI) myocardial infarction: Secondary | ICD-10-CM | POA: Diagnosis not present

## 2020-11-30 DIAGNOSIS — F17200 Nicotine dependence, unspecified, uncomplicated: Secondary | ICD-10-CM | POA: Diagnosis not present

## 2020-11-30 DIAGNOSIS — D509 Iron deficiency anemia, unspecified: Secondary | ICD-10-CM

## 2020-11-30 DIAGNOSIS — Z09 Encounter for follow-up examination after completed treatment for conditions other than malignant neoplasm: Secondary | ICD-10-CM

## 2020-11-30 DIAGNOSIS — J849 Interstitial pulmonary disease, unspecified: Secondary | ICD-10-CM | POA: Diagnosis not present

## 2020-11-30 DIAGNOSIS — R42 Dizziness and giddiness: Secondary | ICD-10-CM | POA: Diagnosis not present

## 2020-11-30 DIAGNOSIS — I959 Hypotension, unspecified: Secondary | ICD-10-CM

## 2020-11-30 DIAGNOSIS — E785 Hyperlipidemia, unspecified: Secondary | ICD-10-CM | POA: Diagnosis not present

## 2020-11-30 DIAGNOSIS — R590 Localized enlarged lymph nodes: Secondary | ICD-10-CM

## 2020-11-30 DIAGNOSIS — I5181 Takotsubo syndrome: Secondary | ICD-10-CM

## 2020-11-30 NOTE — Progress Notes (Signed)
Established Patient Office Visit  Subjective:  Patient ID: Joy Williams, female    DOB: 1950/11/04  Age: 70 y.o. MRN: [id number]  CC:  Chief Complaint  Patient presents with   Hospitalization Follow-up    HPI Joy Williams presents for hospital follow up. Patient was admitted for Takostubo CM 10/24/2020-10/29/2020. Patient was started Jardiance which was discontinued due to dizziness. Patient reports dizziness has improved and has been cautious about standing up slowly from a seated position and staying hydrated with water or Gatorade. Continues to check BP at home and reports her readings vary from low to normal.  Denies chest pain, palpitations, shortness of breath or orthopnea.  Continues using her nicotine patches.  Taking iron supplement without issues.  Denies constipation or GI symptoms.  Patient reports for the past 3 days had noticed a swollen lymph node which is tender to touch and had a sore throat which has resolved.  Reports over the weekend was around a group of people including children.  Denies fever, chills or other upper respiratory symptoms.   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 No significant CAD. The left ventricular systolic function is normal. LV end diastolic pressure is mildly elevated. The left ventricular ejection fraction is 25-35% by visual estimate. There is no aortic  valve stenosis. Prior mitral valve surgery.   Despite lateral ST elevation, we only found nonobstructive coronary artery disease.  Left ventriculogram fits with a pattern of Takotsubo cardiomyopathy.  LV function significantly different from her prior catheterization.  She will need aggressive medical therapy for LV dysfunction.   I discussed the findings with her daughter.     Echocardiogram 10/25/20: 1. Left ventricular ejection fraction, by estimation, is 25 to 30%. The  left ventricle has severely decreased function. The left ventricle  demonstrates global hypokinesis. Left ventricular diastolic parameters are  consistent with Grade I diastolic  dysfunction (impaired relaxation).   2. Right ventricular systolic function is normal. The right ventricular  size is normal. There is moderately elevated pulmonary artery systolic  pressure. The estimated right ventricular systolic pressure is 81.1 mmHg.   3. Left atrial size was moderately dilated.   4. The mitral valve has been repaired/replaced. No evidence of mitral  valve regurgitation. No evidence of mitral stenosis. Severe mitral annular  calcification. Echo findings are consistent with normal structure and  function of the mitral valve  prosthesis.   5. The aortic valve is normal in structure. Aortic valve regurgitation is  not visualized. No aortic stenosis is present.   6. The inferior vena cava is normal in size with greater than 50%  respiratory variability, suggesting right atrial pressure of 3 mmHg.   Comparison(s): Prior images unable to be directly viewed, comparison made  by report only. The left ventricular function is worsened. _____________  History of Present Illness     Joy Williams is a 70 y.o. female with a PMH of MVP s/p minimally invasive bioprosthetic MVR, carotid artery disease s/p R CEA, chronic diastolic CHF, scleroderma, cold agglutinin disease, raynaud's disease, UIP, NSCLC, COPD, and hypothyroidism,  who presented with chest pain, nausea, and vomiting.    She was in her usual state of health until 10/24/20.   She had sudden onset of substernal chest pain, associated with nausea and some vomiting.   EMS was called.  Her ECG had ST elevation in lead I and aVL, consistent with an anterior STEMI.   She was transported emergently to the hospital.  In route, she was given ASA 324 mg, Zofran 4 mg, and sublingual nitroglycerin x1.   She has never had symptoms like this before.   On arrival to the hospital, she was still having pain at a 5/10 and her ECG was still significantly abnormal.  She was taken emergently to the Cath Lab.   Hospital Course     Consultants: None   STEMI 2/2 Takotsubo cardiomyopathy: patient presented with chest pain, nausea, and vomiting 10/24/20 and found to have anterolateral STE on EKG. HsTrop increased from [id number]>4990. She underwent emergent cardiac catheterization which showed 25% pLAD stenosis, otherwise normal coronary arteries, and EF 25-35% with LV gram c/w takotsubo cardiomyopathy. She was started on plavix for conservative management. Echocardiogram showed EF 25-30%, global hypokinesis, G1DD, moderately elevated PA pressures, moderate LAE, and normal structure/function of mitral valve s/p repair/replacement. GDMT has been limited by hypotension - she failed trial of metoprolol succinate and losartan. Subsequently started on jardiance. - Continue plavix - Continue jardiance - Continue to limit salt intake and monitor daily weights - Patient instructed to monitor BP BID and bring log to follow-up visit to help determine if additional GDMT can be started in the outpatient setting.  - Will need repeat echocardiogram in 3 months to evaluate for improvement in LV function.  - Scheduled for hospital follow-up in the [facility] 11/06/20   MVP c/b severe mitral regurgitation s/p bioprosthetic MVR in 2018: valve appeared stable on echo this admission -  Continue routine monitoring   Carotid artery disease s/p right CEA 07/2020: followed by [name] outpatient - Continue routine monitoring   Iron deficiency: patient found to have low ferritin levels this admission. Given feraheme x1 dose and transitioned to po iron supplement at discharge - Continue po iron - Follow-up with PCP for further management.     UIP: follows outpatient with pulmonology. Does have some crackles on exam though no complaints of SOB. 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 [facility] (had to have botox injection)   COPD (chronic obstructive pulmonary disease) ([place])    Family history of adverse reaction to anesthesia    Mother- very sensitive to medication   Heart murmur    MVP   Hypothyroidism    Lung cancer ([place]) 12/30/2014   Synchronous primaries:  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, each treated by wedge resection with post-op adjuvant  chemoRx at [facility]   MVP (mitral valve prolapse) 2018   s/p Minimally-Invasive Mitral Valve Replacement w/  [facility] Mitral bovine bioprosthetic tissue valve (size 28m, model # 7300TFX, serial # [id number]   PONV (postoperative nausea and vomiting)    Raynaud's syndrome    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   [facility] tissue valve placed via right mini thoracotomy approach   Severe mitral regurgitation 11/15/2014   Shortness of breath dyspnea    with exertion   STD (sexually transmitted disease)    STEMI (ST elevation myocardial infarction) [place] 10/24/2020   Telangiectasia     Past  Surgical History:  Procedure Laterality Date   BACK SURGERY     x 3  Disectomy   BREAST BIOPSY Left    CARDIAC CATHETERIZATION N/A 03/08/2016   Procedure: Right/Left Heart Cath and Coronary Angiography;  Surgeon: MSherren Mocha MD;  [facility];  Service: Cardiovascular;  Laterality: N/A;   CLAVICLE SURGERY Left 2013   plate to left collar bone   ENDARTERECTOMY Right 08/09/2020   Procedure: RIGHT CAROTID ENDARTERECTOMY;  Surgeon: CWaynetta Sandy MD;  [facility]  Service: Vascular;  Laterality: Right;   LAPAROSCOPY     ? reason-age 62    LEFT HEART CATH AND CORONARY ANGIOGRAPHY N/A 02/25/2019   Procedure: LEFT HEART CATH AND CORONARY ANGIOGRAPHY;  Surgeon: VJettie Booze MD;  [facility];  Service: Cardiovascular;  Laterality: N/A;   LEFT HEART CATH AND CORONARY ANGIOGRAPHY N/A 10/24/2020   Procedure: LEFT HEART CATH AND CORONARY ANGIOGRAPHY;  Surgeon: VJettie Booze MD;  [facility];  Service: Cardiovascular;  Laterality: N/A;   LUNG CANCER SURGERY     MITRAL VALVE REPAIR Right 06/27/2016   Procedure: MINIMALLY INVASIVE MITRAL VALVE REPLACEMENT;  Surgeon: CRexene Alberts MD;  [facility]  Service: Open Heart Surgery;  Laterality: Right;   TEE WITHOUT CARDIOVERSION N/A 02/22/2016   Procedure: TRANSESOPHAGEAL ECHOCARDIOGRAM (TEE);  Surgeon: KDorothy Spark MD;  [facility]  Service: Cardiovascular;  Laterality: N/A;   TEE WITHOUT CARDIOVERSION N/A 06/27/2016   Procedure: TRANSESOPHAGEAL ECHOCARDIOGRAM (TEE);  Surgeon: CRexene Alberts MD;  [facility]  Service: Open Heart Surgery;  Laterality: N/A;   TONSILLECTOMY     VIDEO ASSISTED THORACOSCOPY (VATS)/WEDGE RESECTION Left 12/30/2014   Bronchoscopy, Mediastinoscopy, Left VATS for Wedge resection LUL x2 adn LLL x1 - [facility]   injected with botox   wrist surgery Left 2015   plate to wrist      Family History  Problem Relation Age of Onset   Mitral valve prolapse Mother    Dementia Father    Prostate cancer Father    Mitral valve prolapse Brother    Mitral valve prolapse Sister    Colon cancer Neg Hx    Colon polyps Neg Hx    Rectal cancer Neg Hx    Esophageal cancer Neg Hx    Stomach cancer Neg Hx    Heart attack Neg Hx     Social History   Socioeconomic History   Marital status: Divorced    Spouse name: Not on file   Number of children: Not on file   Years of education: 16   Highest education level: Not on file  Occupational History   Occupation: Retired  Tobacco Use   Smoking status:  Former    Packs/day: 1.00    Years: 24.00    Pack years: 24.00    Types: E-cigarettes, Cigarettes    Start date: 05/21/1983   Smokeless tobacco: Never   Tobacco comments:    Stopped smoking 10/12/2020  Vaping Use   Vaping Use: Former   Quit date: 05/20/2006  Substance and Sexual Activity   Alcohol use: Yes    Alcohol/week: 2.0 standard drinks    Types: 1 Shots of liquor, 1 Standard drinks or equivalent per week   Drug use: No   Sexual activity: Not Currently    Partners: Male    Birth control/protection: Post-menopausal  Other Topics Concern   Not on file  Social History Narrative   Not on file   Social Determinants of Health   Financial Resource Strain: Low Risk    Difficulty of Paying Living Expenses: Not very hard  Food Insecurity: No Food Insecurity   Worried About Charity fundraiser in the Last Year: Never true   Arboriculturist in the Last Year: Never true  Transportation Needs: Unmet Transportation Needs   Lack of Transportation (Medical): No   Lack of Transportation (Non-Medical): Yes  Physical Activity: Not on file  Stress: Not on file  Social Connections: Not on file  Intimate Partner Violence: Not on file    Outpatient Medications Prior to Visit  Medication Sig Dispense Refill   acetaminophen (TYLENOL) 500 MG tablet Take 1,000 mg by mouth  every 6 (six) hours as needed for mild pain or headache.     clopidogrel (PLAVIX) 75 MG tablet Take 1 tablet (75 mg total) by mouth daily. 90 tablet 3   ferrous sulfate 325 (65 FE) MG tablet Take 1 tablet (325 mg total) by mouth daily. 30 tablet 6   Fluticasone-Umeclidin-Vilant (TRELEGY ELLIPTA) 100-62.5-25 MCG/INH AEPB TAKE 1 PUFF BY MOUTH EVERY DAY 60 each 11   levothyroxine (SYNTHROID) 175 MCG tablet TAKE 1 TABLET BY MOUTH DAILY BEFORE BREAKFAST 90 tablet 1   MELATONIN PO Take 1 tablet by mouth at bedtime as needed (sleep).     Multiple Vitamins-Minerals (MULTIVITAMIN WITH MINERALS) tablet Take 1 tablet by mouth daily.     nicotine (NICODERM CQ - DOSED IN MG/24 HR) 7 mg/24hr patch Place 1 patch (7 mg total) onto the skin daily. 28 patch 0   Turmeric 500 MG CAPS Take 500 mg by mouth daily.     vitamin B-12 (CYANOCOBALAMIN) 1000 MCG tablet Take 1 tablet (1,000 mcg total) by mouth daily.     rosuvastatin (CRESTOR) 20 MG tablet Take 1 tablet (20 mg total) by mouth daily. (Patient not taking: Reported on 11/30/2020) 90 tablet 3   No facility-administered medications prior to visit.    Allergies  Allergen Reactions   Penicillins Hives    Unknown, occurred as a child Has patient had a PCN reaction causing immediate rash, facial/tongue/throat swelling, SOB or lightheadedness with hypotension: Yes Has patient had a PCN reaction causing severe rash involving mucus membranes or skin necrosis: No Has patient had a PCN reaction that required hospitalization  Has patient had a PCN reaction occurring within the last 10 years: No If all of the above answers are "NO", then may proceed with Cephalosporin use. No    ROS Review of Systems Review of Systems:  A fourteen system review of systems was performed and found to be positive as per HPI.   Objective:    Physical Exam General:  Well Developed, well nourished, appropriate  for stated age.  Neuro:  Alert and oriented,  extra-ocular muscles intact   HEENT:  Normocephalic, atraumatic, no sinus tenderness, normal TM of both ears, normal posterior oropharynx w/o exudates, neck supple, +cervical adenopathy (submandibular gland swollen and tender- right side) Skin:  no gross rash, warm, pink. Cardiac:  RRR, +murmur  Respiratory:  Crackles at the bases, no wheezing, Not using accessory muscles, speaking in full sentences- unlabored. Vascular:  Ext warm, no cyanosis apprec.; cap RF less 2 sec. Psych:  No HI/SI, judgement and insight good, Euthymic mood. Full Affect.  BP (!) 88/49   Pulse 77   Temp 98.7 F (37.1 C)   Ht 5' 6.5" (1.689 m)   Wt 136 lb 1.9 oz (61.7 kg)   SpO2 97%   BMI 21.64 kg/m  Wt Readings from Last 3 Encounters:  11/30/20 136 lb 1.9 oz (61.7 kg)  11/15/20 140 lb (63.5 kg)  11/06/20 138 lb 9.6 oz (62.9 kg)     Health Maintenance Due  Topic Date Due   COVID-19 Vaccine (4 - Booster for Moderna series) 04/25/2020   MAMMOGRAM  07/02/2020    There are no preventive care reminders to display for this patient.  Lab Results  Component Value Date   TSH 0.478 10/25/2020   Lab Results  Component Value Date   WBC 6.7 11/30/2020   HGB 13.5 11/30/2020   HCT 41.6 11/30/2020   MCV 85 11/30/2020   PLT 211 11/30/2020   Lab Results  Component Value Date   NA 136 11/30/2020   K 4.3 11/30/2020   CO2 24 11/30/2020   GLUCOSE 85 11/30/2020   BUN 10 11/30/2020   CREATININE 0.74 11/30/2020   BILITOT 0.3 11/30/2020   ALKPHOS 74 11/30/2020   AST 23 11/30/2020   ALT 12 11/30/2020   PROT 7.1 11/30/2020   ALBUMIN 4.6 11/30/2020   CALCIUM 9.9 11/30/2020   ANIONGAP 9 11/03/2020   EGFR 88 11/30/2020   GFR 83.76 10/12/2020   Lab Results  Component Value Date   CHOL 135 10/24/2020   Lab Results  Component Value Date   HDL 60 10/24/2020   Lab Results  Component Value Date   LDLCALC 55 10/24/2020   Lab Results  Component Value Date   TRIG 100 10/24/2020   Lab Results  Component Value Date   CHOLHDL 2.3  10/24/2020   Lab Results  Component Value Date   HGBA1C 5.5 10/24/2020      Assessment & Plan:   Problem List Items Addressed This Visit       Cardiovascular and Mediastinum   NSTEMI (non-ST elevated myocardial infarction) ([place])   Relevant Medications   rosuvastatin (CRESTOR) 20 MG tablet   Takotsubo cardiomyopathy   Relevant Medications   rosuvastatin (CRESTOR) 20 MG tablet     Other   Hyperlipidemia LDL goal <70   Relevant Medications   rosuvastatin (CRESTOR) 20 MG tablet   Other Relevant Orders   Comp Met (CMET) (Completed)   Anemia   Relevant Orders   CBC w/Diff (Completed)   Iron, TIBC and Ferritin Panel (Completed)   Other Visit Diagnoses     Hospital discharge follow-up    -  Primary   Dizziness       Sore throat       Relevant Orders   Culture, Group A Strep   Cervical adenopathy       Relevant Orders   CBC w/Diff (Completed)   Culture, Group A Strep   Tobacco use  disorder       Hypotension, unspecified hypotension type       Relevant Medications   rosuvastatin (CRESTOR) 20 MG tablet   Interstitial lung disease [facility])          Hospital discharge follow up Takotsubo cardiomyopathy: -Reviewed hospital noted, labs and imaging studies. -Patient has followed up with cardiology, Dr. Johney Frame and heart and vascular. -LVEF on recent echocardiogram with cardiology improved. -Patient is on plavix. -Continue to follow up with cardiology as advised.  H/o NSTEMI: -Followed by cardiology. -Patient is on plavix and rosuvastatin 20 mg. -Patient did not tolerate beta blocker therapy due to orthostatic hypotension and lisinopril was discontinued as well.  Interstitial lung diease: -Followed by pulmonology. -Patient has appointment with rheumatology for further evaluation.  Hypotension: -Soft blood pressure in office with repeat blood pressure lower. Recommend to continue to monitor BP at home and if BP consistently <90/60 then advised to notify the office or  cardiologist. -Use compression socks. -Recommend to stay hydrated and continue to rise slowing with position changes. Will collect CMP to evaluate for electrolyte imbalance.   Hyperlipidemia: -Patient reports is taking rosuvastatin, on intake said she was not taking medication because she was unaware of brand name. -Recommend to continue statin therapy. -Will continue to monitor.  Dizziness: -Improved.  -Hypotension likely also contributing to symptoms. -Will continue to monitor.  Anemia: -Will repeat CBC and iron panel. -Continue ferrous sulfate.  Tobacco use disorder: -Encourage to continue with tobacco cessation and NRT.  Sore throat, cervical adenopathy: -Patient reports recent sore throat and has moderate swelling with tenderness of submandibular gland so will collect throat culture and obtain CBC for further evaluation. Pending results will start antibiotic therapy if indicated. -If adenopathy fails to improve or worsen recommend referral to ENT.     Meds ordered this encounter  Medications   rosuvastatin (CRESTOR) 20 MG tablet    Sig: Take 1 tablet (20 mg total) by mouth daily.    Dispense:  90 tablet    Refill:  0    Order Specific Question:   Supervising Provider    Answer:   Beatrice Lecher D [[id number]]     Follow-up: Return for as scheduled .   Note:  This note was prepared with assistance of Dragon voice recognition software. Occasional wrong-word or sound-a-like substitutions may have occurred due to the inherent limitations of voice recognition software.   Lorrene Reid, PA-C

## 2020-11-30 NOTE — Patient Instructions (Addendum)
Hypotension °As your heart beats, it forces blood through your body. Hypotension, commonly called low blood pressure, is when the force of blood pumping through your arteries is too weak. Arteries are blood vessels that carry blood from the heart throughout the body. Depending on the cause and severity, hypotension may be harmless (benign) or may cause serious problems (be critical). °When blood pressure is too low, you may not get enough blood to your brain or to the rest of your organs. This can cause weakness, light-headedness, rapid heartbeat, and fainting. °What are the causes? °This condition may be caused by: °Blood loss. °Loss of body fluids (dehydration). °Heart problems. °Hormone (endocrine) problems. °Pregnancy. °Severe infection. °Lack of certain nutrients. °Severe allergic reactions (anaphylaxis). °Certain medicines, such as blood pressure medicine or medicines that make the body lose excess fluids (diuretics). Sometimes, hypotension may be caused by not taking medicine as directed, such as taking too much of a certain medicine. °What increases the risk? °The following factors may make you more likely to develop this condition: °Age. Risk increases as you get older. °Conditions that affect the heart or the central nervous system. °Taking certain medicines, such as blood pressure medicine or diuretics. °Being pregnant. °What are the signs or symptoms? °Common symptoms of this condition include: °Weakness. °Light-headedness. °Dizziness. °Blurred vision. °Fatigue. °Rapid heartbeat. °Fainting, in severe cases. °How is this diagnosed? °This condition is diagnosed based on: °Your medical history. °Your symptoms. °Your blood pressure measurement. Your health care provider will check your blood pressure when you are: °Lying down. °Sitting. °Standing. °A blood pressure reading is recorded as two numbers, such as "120 over 80" (or 120/80). The first ("top") number is called the systolic pressure. It is a measure  of the pressure in your arteries as your heart beats. The second ("bottom") number is called the diastolic pressure. It is a measure of the pressure in your arteries when your heart relaxes between beats. Blood pressure is measured in a unit called mm Hg. Healthy blood pressure for most adults is 120/80. If your blood pressure is below 90/60, you may be diagnosed with hypotension. °Other information or tests that may be used to diagnose hypotension include: °Your other vital signs, such as your heart rate and temperature. °Blood tests. °Tilt table test. For this test, you will be safely secured to a table that moves you from a lying position to an upright position. Your heart rhythm and blood pressure will be monitored during the test. °How is this treated? °Treatment for this condition may include: °Changing your diet. This may involve eating more salt (sodium) or drinking more water. °Taking medicines to raise your blood pressure. °Changing the dosage of certain medicines you are taking that might be lowering your blood pressure. °Wearing compression stockings. These stockings help to prevent blood clots and reduce swelling in your legs. °In some cases, you may need to go to the hospital for: °Fluid replacement. This means you will receive fluids through an IV. °Blood replacement. This means you will receive donated blood through an IV (transfusion). °Treating an infection or heart problems, if this applies. °Monitoring. You may need to be monitored while medicines that you are taking wear off. °Follow these instructions at home: °Eating and drinking ° °Drink enough fluid to keep your urine pale yellow. °Eat a healthy diet, and follow instructions from your health care provider about eating or drinking restrictions. A healthy diet includes: °Fresh fruits and vegetables. °Whole grains. °Lean meats. °Low-fat dairy products. °Eat extra salt   only as directed. Do not add extra salt to your diet unless your health care  provider told you to do that. °Eat frequent, small meals. °Avoid standing up suddenly after eating. °Medicines °Take over-the-counter and prescription medicines only as told by your health care provider. °Follow instructions from your health care provider about changing the dosage of your current medicines, if this applies. °Do not stop or adjust any of your medicines on your own. °General instructions ° °Wear compression stockings as told by your health care provider. °Get up slowly from lying down or sitting positions. This gives your blood pressure a chance to adjust. °Avoid hot showers and excessive heat as directed by your health care provider. °Return to your normal activities as told by your health care provider. Ask your health care provider what activities are safe for you. °Do not use any products that contain nicotine or tobacco, such as cigarettes, e-cigarettes, and chewing tobacco. If you need help quitting, ask your health care provider. °Keep all follow-up visits as told by your health care provider. This is important. °Contact a health care provider if you: °Vomit. °Have diarrhea. °Have a fever for more than 2-3 days. °Feel more thirsty than usual. °Feel weak and tired. °Get help right away if you: °Have chest pain. °Have a fast or irregular heartbeat. °Develop numbness in any part of your body. °Cannot move your arms or your legs. °Have trouble speaking. °Become sweaty or feel light-headed. °Faint. °Feel short of breath. °Have trouble staying awake. °Feel confused. °Summary °Hypotension is when the force of blood pumping through your arteries is too weak. °Hypotension may be harmless (benign) or may cause serious problems (be critical). °Treatment for this condition may include changing your diet, changing your medicines, and wearing compression stockings. °In some cases, you may need to go to the hospital for fluid or blood replacement. °This information is not intended to replace advice given to  you by your health care provider. Make sure you discuss any questions you have with your health care provider. °Document Revised: 10/30/2017 Document Reviewed: 10/30/2017 °[employer] Patient Education © 2022 [employer] ° °

## 2020-12-01 LAB — CBC WITH DIFFERENTIAL/PLATELET
Basophils Absolute: 0 10*3/uL (ref 0.0–0.2)
Basos: 0 %
EOS (ABSOLUTE): 0.1 10*3/uL (ref 0.0–0.4)
Eos: 1 %
Hematocrit: 41.6 % (ref 34.0–46.6)
Hemoglobin: 13.5 g/dL (ref 11.1–15.9)
Immature Grans (Abs): 0.1 10*3/uL (ref 0.0–0.1)
Immature Granulocytes: 1 %
Lymphocytes Absolute: 1.1 10*3/uL (ref 0.7–3.1)
Lymphs: 16 %
MCH: 27.6 pg (ref 26.6–33.0)
MCHC: 32.5 g/dL (ref 31.5–35.7)
MCV: 85 fL (ref 79–97)
Monocytes Absolute: 0.4 10*3/uL (ref 0.1–0.9)
Monocytes: 5 %
Neutrophils Absolute: 5.1 10*3/uL (ref 1.4–7.0)
Neutrophils: 77 %
Platelets: 211 10*3/uL (ref 150–450)
RBC: 4.89 x10E6/uL (ref 3.77–5.28)
RDW: 13.6 % (ref 11.7–15.4)
WBC: 6.7 10*3/uL (ref 3.4–10.8)

## 2020-12-01 LAB — COMPREHENSIVE METABOLIC PANEL
ALT: 12 IU/L (ref 0–32)
AST: 23 IU/L (ref 0–40)
Albumin/Globulin Ratio: 1.8 (ref 1.2–2.2)
Albumin: 4.6 g/dL (ref 3.8–4.8)
Alkaline Phosphatase: 74 IU/L (ref 44–121)
BUN/Creatinine Ratio: 14 (ref 12–28)
BUN: 10 mg/dL (ref 8–27)
Bilirubin Total: 0.3 mg/dL (ref 0.0–1.2)
CO2: 24 mmol/L (ref 20–29)
Calcium: 9.9 mg/dL (ref 8.7–10.3)
Chloride: 97 mmol/L (ref 96–106)
Creatinine, Ser: 0.74 mg/dL (ref 0.57–1.00)
Globulin, Total: 2.5 g/dL (ref 1.5–4.5)
Glucose: 85 mg/dL (ref 65–99)
Potassium: 4.3 mmol/L (ref 3.5–5.2)
Sodium: 136 mmol/L (ref 134–144)
Total Protein: 7.1 g/dL (ref 6.0–8.5)
eGFR: 88 mL/min/{1.73_m2} (ref >59)

## 2020-12-01 LAB — IRON,TIBC AND FERRITIN PANEL
Ferritin: 334 ng/mL — ABNORMAL HIGH (ref 15–150)
Iron Saturation: 20 % (ref 15–55)
Iron: 53 ug/dL (ref 27–139)
Total Iron Binding Capacity: 262 ug/dL (ref 250–450)
UIBC: 209 ug/dL (ref 118–369)

## 2020-12-01 MED ORDER — ROSUVASTATIN CALCIUM 20 MG PO TABS: 20.0000 mg | ORAL_TABLET | Freq: Every day | ORAL | 0 refills | Status: DC

## 2020-12-01 NOTE — Progress Notes (Deleted)
Office Visit Note  Patient: Joy Williams             Date of Birth: 1951-01-21           MRN: [id number]             PCP: Lorrene Reid, PA-C Referring: Brand Males, MD Visit Date: 12/13/2020 Occupation: '@GUAROCC' @  Subjective:  No chief complaint on file.   History of Present Illness: Joy Williams is a 70 y.o. female ***   Activities of Daily Living:  Patient reports morning stiffness for *** {minute/hour:19697}.   Patient {ACTIONS;DENIES/REPORTS:21021675::"Denies"} nocturnal pain.  Difficulty dressing/grooming: {ACTIONS;DENIES/REPORTS:21021675::"Denies"} Difficulty climbing stairs: {ACTIONS;DENIES/REPORTS:21021675::"Denies"} Difficulty getting out of chair: {ACTIONS;DENIES/REPORTS:21021675::"Denies"} Difficulty using hands for taps, buttons, cutlery, and/or writing: {ACTIONS;DENIES/REPORTS:21021675::"Denies"}  No Rheumatology ROS completed.   PMFS History:  Patient Active Problem List   Diagnosis Date Noted   Acute systolic CHF (congestive heart failure) ([place]) 10/25/2020   STEMI (ST elevation myocardial infarction) ([place]) 10/24/2020   Precordial chest pain 10/24/2020   Takotsubo cardiomyopathy    Closed fracture of phalanx of foot 08/17/2020   Symptomatic stenosis of right carotid artery 08/08/2020   TIA (transient ischemic attack) 08/06/2020   Anemia 08/06/2020   Encounter for counseling 02/08/2020   Suppression of immune system - subtherapeutic ([place]) 07/14/2019   History of TIA (transient ischemic attack) 05/10/2019   Non-ST elevated myocardial infarction ([place]) 02/25/2019   NSTEMI (non-ST elevated myocardial infarction) ([place]) 02/25/2019   Elevated troponin level not due to acute coronary syndrome    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   On statin therapy 04/27/2018   Memory loss or impairment 35/36/1443   Complicated migraine 15/40/0867   Hyperlipidemia LDL goal <70 03/12/2018   History of RPR test, 9/18 felt to be false positive  03/12/2018   Stroke-like episode ([place]) s/p tPA 03/11/2018   Dysuria 10/14/2017   Abnormal finding on urinalysis 10/14/2017   Pharyngitis 10/14/2017   Closed nondisplaced fracture of base of fifth metacarpal bone of right hand 05/27/2017   Decreased range of motion of right ankle 05/27/2017   Pain and swelling of right wrist 05/27/2017   Age-related osteoporosis without current pathological fracture 02/20/2017   ASCUS of cervix with negative high risk HPV 01/29/2017   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   Tobacco abuse 01/22/2017   Tobacco abuse counseling 01/22/2017   Neoplastic malignant related fatigue 01/22/2017   Vitamin D insufficiency 01/22/2017   Estrogen deficiency 01/14/2017   H/O cardiac catheterization- in prep for lung ca sx 01/06/2017   Hypothyroidism 01/06/2017   Previous back surgeries 01/06/2017   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   Chronic diastolic congestive heart failure (HCC)    Chronic obstructive pulmonary disease (HCC)    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   Antineoplastic chemotherapy induced anemia 05/05/2015   History of epistaxis 03/30/2015   Raynaud's disease 01/19/2015   Malignant neoplasm of left lung ([place]) 12/30/2014   Lung cancer ([place]) 12/30/2014   Malignant neoplasm of overlapping sites of left lung ([place]) 12/06/2014   Nonintractable episodic headache 12/06/2014   Severe mitral regurgitation 11/15/2014   Scleroderma ([place]) 10/03/2014   Asthmatic bronchitis , chronic ([place]) 10/03/2014   Multiple pulmonary nodules 10/03/2014   Current Cigarette smoker 10/03/2014    Past Medical History:  Diagnosis Date   Allergy    Asthma    pt denies this, but is on [facility]  Carotid artery disease ([place]) 2019   1-39% stenosis by Dopplers    Chronic diastolic congestive heart failure (HCC)    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/24/9627    Complication of anesthesia    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 [facility] (had to have botox injection)   COPD (chronic obstructive pulmonary disease) ([place])    Family history of adverse reaction to anesthesia    Mother- very sensitive to medication   Heart murmur    MVP   Hypothyroidism    Lung cancer ([place]) 12/30/2014   Synchronous primaries:  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, each treated by wedge resection with post-op adjuvant [facility]   MVP (mitral valve prolapse) 2018   s/p Minimally-Invasive Mitral Valve Replacement w/  [facility] Mitral bovine bioprosthetic tissue valve (size 40m, model # 7300TFX, serial # [id number]   PONV (postoperative nausea and vomiting)    Raynaud's syndrome    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   31 mm Edwards Magna mitral bovine bioprosthetic tissue valve placed via right mini thoracotomy approach   Severe mitral regurgitation 11/15/2014   Shortness of breath dyspnea    with exertion   STD (sexually transmitted disease)    STEMI (ST elevation myocardial infarction) ([place] 10/24/2020   Telangiectasia     Family History  Problem Relation Age of Onset   Mitral valve prolapse Mother    Dementia Father    Prostate cancer Father    Mitral valve prolapse Brother    Mitral valve prolapse Sister    Colon cancer Neg Hx    Colon polyps Neg Hx    Rectal cancer Neg Hx    Esophageal cancer Neg Hx    Stomach cancer Neg Hx    Heart attack Neg Hx    Past Surgical History:  Procedure Laterality Date   BACK SURGERY     x 3  Disectomy   BREAST BIOPSY Left    CARDIAC CATHETERIZATION N/A 03/08/2016   Procedure: Right/Left Heart Cath and Coronary Angiography;  Surgeon: MSherren Mocha MD;  [facility];  Service: Cardiovascular;  Laterality: N/A;   CLAVICLE SURGERY Left 2013   plate to left collar bone   ENDARTERECTOMY Right 08/09/2020   Procedure: RIGHT CAROTID ENDARTERECTOMY;   Surgeon: CWaynetta Sandy MD;  [place]  Service: Vascular;  Laterality: Right;   LAPAROSCOPY     ? reason-age 41    LEFT HEART CATH AND CORONARY ANGIOGRAPHY N/A 02/25/2019   Procedure: LEFT HEART CATH AND CORONARY ANGIOGRAPHY;  Surgeon: VJettie Booze MD;  [facility];  Service: Cardiovascular;  Laterality: N/A;   LEFT HEART CATH AND CORONARY ANGIOGRAPHY N/A 10/24/2020   Procedure: LEFT HEART CATH AND CORONARY ANGIOGRAPHY;  Surgeon: VJettie Booze MD;  [facility];  Service: Cardiovascular;  Laterality: N/A;   LUNG CANCER SURGERY     MITRAL VALVE REPAIR Right 06/27/2016   Procedure: MINIMALLY INVASIVE MITRAL VALVE REPLACEMENT;  Surgeon: CRexene Alberts MD;  [facility]  Service: Open Heart Surgery;  Laterality: Right;   TEE WITHOUT CARDIOVERSION N/A 02/22/2016   Procedure: TRANSESOPHAGEAL ECHOCARDIOGRAM (TEE);  Surgeon: KDorothy Spark MD;  [place] [facility]  Service: Cardiovascular;  Laterality: N/A;   TEE WITHOUT CARDIOVERSION N/A 06/27/2016   Procedure: TRANSESOPHAGEAL ECHOCARDIOGRAM (TEE);  Surgeon: CRexene Alberts MD;  [place] [facility]  Service: Open Heart Surgery;  Laterality: N/A;  TONSILLECTOMY     VIDEO ASSISTED THORACOSCOPY (VATS)/WEDGE RESECTION Left 12/30/2014   Bronchoscopy, Mediastinoscopy, Left VATS for Wedge resection LUL x2 adn LLL x1 - Dr. Elenor Quinones at [facility] Left 2017   injected with botox   wrist surgery Left 2015   plate to wrist    Social History   Social History Narrative   Not on file   Immunization History  Administered Date(s) Administered   Fluad Quad(high Dose 65+) 02/18/2019   H1N1 04/13/2008   Influenza Split 02/18/2015   Influenza, High Dose Seasonal PF 02/20/2017, 04/27/2018   Influenza,inj,Quad PF,6+ Mos 03/16/2015   Influenza-Unspecified 04/12/2008, 02/18/2011   Moderna Sars-Covid-2 Vaccination 06/21/2019, 07/18/2019, 01/25/2020   Pneumococcal  Conjugate-13 01/22/2017   Tdap 09/26/2015   Zoster Recombinat (Shingrix) 05/10/2018, 03/03/2019     Objective: Vital Signs: There were no vitals taken for this visit.   Physical Exam   Musculoskeletal Exam: ***  CDAI Exam: CDAI Score: -- Patient Global: --; Provider Global: -- Swollen: --; Tender: -- Joint Exam 12/13/2020   No joint exam has been documented for this visit   There is currently no information documented on the homunculus. Go to the Rheumatology activity and complete the homunculus joint exam.  Investigation: No additional findings.  Imaging: DG Chest Port 1 View  Result Date: 11/03/2020 CLINICAL DATA:  Chest pain, near syncope EXAM: PORTABLE CHEST 1 VIEW COMPARISON:  Chest radiograph 10/24/2020 FINDINGS: Unchanged cardiomediastinal silhouette with mitral valve replacement and annular calcification. Unchanged bibasilar fibrotic interstitial lung disease. No new focal airspace consolidation. Postsurgical changes in the left upper lung. No large pleural effusion or visible pneumothorax. Unchanged left clavicle fixation hardware. No acute osseous abnormality. 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 No evidence of new focal airspace disease. Electronically Signed   By: Maurine Simmering   On: 11/03/2020 13:39   ECHOCARDIOGRAM LIMITED  Result Date: 11/22/2020    ECHOCARDIOGRAM LIMITED REPORT   Patient Name:   [facility] Date of Exam: 11/22/2020 Medical Rec #:  [id number]        Height:       66.5 in Accession #:    [id number]       Weight:       140.0 lb Date of Birth:  [birth year]        BSA:          1.728 m Patient Age:    61 years         BP:           104/62 mmHg Patient Gender: F                HR:           70 bpm. Exam Location:  [address] Procedure: 3D Echo, Limited Echo, Cardiac Doppler, Color Doppler and Strain            Analysis Indications:    Z95.3 MVR. LIMITED for LVEF.  History:        Patient has prior history of  Echocardiogram examinations, most                 recent 10/25/2020. CHF and Takotsubo cadiomyopathy, Previous                 Myocardial Infarction, TIA and COPD; Risk Factors:Former Smoker                 and Dyslipidemia. Raynaud's disease. Lung cancer.  Mitral Valve: 31 mm bioprosthetic valve valve is present in the                 mitral position. Procedure Date: 06/27/16.  Sonographer:    Jessee Avers, RDCS Referring Phys: [id number] [place]  1. Left ventricular ejection fraction, by estimation, is 60 to 65%. Left ventricular ejection fraction by 3D volume is 62 %. The left ventricle has normal function. There is mild concentric left ventricular hypertrophy. Left ventricular diastolic function could not be evaluated.  2. Right ventricular systolic function is normal. The right ventricular size is normal. There is normal pulmonary artery systolic pressure. The estimated right ventricular systolic pressure is 92.3 mmHg.  3. Left atrial size was mild to moderately dilated.  4. 31 mm Magna Ease in the mitral position 06/27/2016. MG 2 mmHG @ 65 bpm. EOA 2.27 cm2. Trivial central MR. No stenosis. Normal prosthesis. Trivial mitral valve regurgitation. The mean mitral valve gradient is 2.0 mmHg with average heart rate of 65 bpm. There is a 31 mm bioprosthetic valve present in the mitral position. Procedure Date: 06/27/16.  5. The aortic valve is tricuspid. Aortic valve regurgitation is not visualized. No aortic stenosis is present.  6. The inferior vena cava is normal in size with greater than 50% respiratory variability, suggesting right atrial pressure of 3 mmHg. Comparison(s): Changes from prior study are noted. EF is now 60-65%. WMA have resolved. FINDINGS  Left Ventricle: Left ventricular ejection fraction, by estimation, is 60 to 65%. Left ventricular ejection fraction by 3D volume is 62 %. The left ventricle has normal function. Global longitudinal strain performed but not reported  based on interpreter judgement due to suboptimal tracking. The left ventricular internal cavity size was normal in size. There is mild concentric left ventricular hypertrophy. Left ventricular diastolic function could not be evaluated. Left ventricular diastolic function could not be evaluated due to mitral valve replacement. Right Ventricle: The right ventricular size is normal. No increase in right ventricular wall thickness. Right ventricular systolic function is normal. There is normal pulmonary artery systolic pressure. The tricuspid regurgitant velocity is 2.84 m/s, and  with an assumed right atrial pressure of 3 mmHg, the estimated right ventricular systolic pressure is 30.0 mmHg. Left Atrium: Left atrial size was mild to moderately dilated. Mitral Valve: 31 mm Magna Ease in the mitral position 06/27/2016. MG 2 mmHG @ 65 bpm. EOA 2.27 cm2. Trivial central MR. No stenosis. Normal prosthesis. Trivial mitral valve regurgitation. There is a 31 mm bioprosthetic valve present in the mitral position.  Procedure Date: 06/27/16. MV peak gradient, 4.2 mmHg. The mean mitral valve gradient is 2.0 mmHg with average heart rate of 65 bpm. Tricuspid Valve: The tricuspid valve is grossly normal. Tricuspid valve regurgitation is mild . No evidence of tricuspid stenosis. Aortic Valve: The aortic valve is tricuspid. Aortic valve regurgitation is not visualized. No aortic stenosis is present. Pulmonic Valve: The pulmonic valve was grossly normal. Pulmonic valve regurgitation is not visualized. No evidence of pulmonic stenosis. Venous: The inferior vena cava is normal in size with greater than 50% respiratory variability, suggesting right atrial pressure of 3 mmHg. LEFT VENTRICLE PLAX 2D LVIDd:         4.00 cm         Diastology LVIDs:         2.50 cm         LV e' medial:    3.09 cm/s LV PW:  1.20 cm         LV E/e' medial:  31.7 LV IVS:        1.10 cm         LV e' lateral:   6.41 cm/s LVOT diam:     1.85 cm         LV E/e'  lateral: 15.3 LV SV:         72 LV SV Index:   42              2D LVOT Area:     2.69 cm        Longitudinal                                Strain                                2D Strain GLS  -24.9 %                                (A2C):                                2D Strain GLS  -16.1 %                                (A3C):                                2D Strain GLS  -15.6 %                                (A4C):                                2D Strain GLS  -18.9 %                                Avg:                                 3D Volume EF                                LV 3D EF:    Left                                             ventricular                                             ejection  fraction by                                             3D volume                                             is 62 %.                                 3D Volume EF:                                3D EF:        62 %                                LV EDV:       84 ml                                LV ESV:       32 ml                                LV SV:        52 ml RIGHT VENTRICLE RVSP:           35.3 mmHg LEFT ATRIUM             Index       RIGHT ATRIUM LA diam:        3.70 cm 2.14 cm/m  RA Pressure: 3.00 mmHg LA Vol (A2C):   60.1 ml 34.78 ml/m LA Vol (A4C):   78.0 ml 45.13 ml/m LA Biplane Vol: 74.0 ml 42.82 ml/m  AORTIC VALVE LVOT Vmax:   134.56 cm/s LVOT Vmean:  91.417 cm/s LVOT VTI:    0.267 m MITRAL VALVE               TRICUSPID VALVE MV Area (PHT): 3.53 cm    TR Peak grad:   32.3 mmHg MV Area VTI:   2.27 cm    TR Vmax:        284.00 cm/s MV Peak grad:  4.2 mmHg    Estimated RAP:  3.00 mmHg MV Mean grad:  2.0 mmHg    RVSP:           35.3 mmHg MV Vmax:       1.02 m/s MV Vmean:      60.7 cm/s   SHUNTS MV VTI:        0.32 m      Systemic VTI:  0.27 m MV Decel Time: 215 msec    Systemic Diam: 1.85 cm MV E velocity: 98.10 cm/s MV A velocity: 98.50 cm/s MV E/A  ratio:  1.00 Eleonore Chiquito MD Electronically signed by Eleonore Chiquito MD Signature Date/Time: 11/22/2020/3:04:39 PM    Final     Recent Labs: Lab Results  Component Value Date   WBC 6.7 11/30/2020   HGB 13.5 11/30/2020  PLT 211 11/30/2020   NA 136 11/30/2020   K 4.3 11/30/2020   CL 97 11/30/2020   CO2 24 11/30/2020   GLUCOSE 85 11/30/2020   BUN 10 11/30/2020   CREATININE 0.74 11/30/2020   BILITOT 0.3 11/30/2020   ALKPHOS 74 11/30/2020   AST 23 11/30/2020   ALT 12 11/30/2020   PROT 7.1 11/30/2020   ALBUMIN 4.6 11/30/2020   CALCIUM 9.9 11/30/2020   GFRAA 99 06/15/2020   QFTBGOLDPLUS NEGATIVE 10/12/2020    Speciality Comments: No specialty comments available.  Procedures:  No procedures performed Allergies: Penicillins   Assessment / Plan:     Visit Diagnoses: ILD (interstitial lung disease) ([place])  Chronic cough  Positive ANA (antinuclear antibody) - 10/12/20: ANA 1:1280NN, 1:320NS, dsDNA<1, anti-CCP<16, RF<14, scl-70-, aldolase 5.9, ESR 32, Ro-, La-, Myeloperoxidase ab-, serine protease 3-, TB gold-  Scleroderma (HCC)  Age-related osteoporosis without current pathological fracture  Vitamin D insufficiency  TIA (transient ischemic attack)  Takotsubo cardiomyopathy  Symptomatic stenosis of right carotid artery  Raynaud's disease without gangrene  NSTEMI (non-ST elevated myocardial infarction) (HCC)  Complicated migraine  Chronic diastolic congestive heart failure (HCC)  Malignant neoplasm of overlapping sites of left lung (HCC)  History of COPD  History of hypothyroidism  Tobacco abuse  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  No orders of the defined types were placed in this encounter.   Face-to-face time spent with patient was *** minutes. Greater than 50% of time was spent in counseling and coordination of care.  Follow-Up Instructions: No follow-ups on file.   Ofilia Neas, PA-C  Note - This record has been created using Dragon software.  Chart creation errors have been sought, but may not always  have been located. Such creation errors do not reflect on  the standard of medical care.

## 2020-12-03 LAB — CULTURE, GROUP A STREP: Strep A Culture: NEGATIVE

## 2020-12-07 ENCOUNTER — Ambulatory Visit: Payer: Medicare Other | Admitting: Adult Health

## 2020-12-12 ENCOUNTER — Ambulatory Visit: Payer: Medicare Other

## 2020-12-12 ENCOUNTER — Ambulatory Visit: Payer: Medicare Other | Admitting: Nurse Practitioner

## 2020-12-12 ENCOUNTER — Emergency Department (HOSPITAL_COMMUNITY)
Admission: EM | Admit: 2020-12-12 | Discharge: 2020-12-12 | Disposition: A | Discharge status: Home / Self Care | Payer: Medicare Other | Attending: Emergency Medicine | Admitting: Emergency Medicine

## 2020-12-12 ENCOUNTER — Encounter: Payer: Self-pay | Admitting: Physician Assistant

## 2020-12-12 ENCOUNTER — Ambulatory Visit (HOSPITAL_COMMUNITY)
Admission: EM | Admit: 2020-12-12 | Discharge: 2020-12-12 | Disposition: A | Discharge status: Home / Self Care | Payer: Medicare Other | Attending: Family Medicine | Admitting: Family Medicine

## 2020-12-12 ENCOUNTER — Telehealth: Payer: Self-pay | Admitting: Physician Assistant

## 2020-12-12 ENCOUNTER — Encounter (HOSPITAL_COMMUNITY): Payer: Self-pay

## 2020-12-12 ENCOUNTER — Other Ambulatory Visit: Payer: Self-pay

## 2020-12-12 DIAGNOSIS — R0602 Shortness of breath: Secondary | ICD-10-CM | POA: Diagnosis not present

## 2020-12-12 DIAGNOSIS — R600 Localized edema: Secondary | ICD-10-CM | POA: Diagnosis not present

## 2020-12-12 DIAGNOSIS — T63441A Toxic effect of venom of bees, accidental (unintentional), initial encounter: Secondary | ICD-10-CM | POA: Insufficient documentation

## 2020-12-12 DIAGNOSIS — Z5321 Procedure and treatment not carried out due to patient leaving prior to being seen by health care provider: Secondary | ICD-10-CM | POA: Insufficient documentation

## 2020-12-12 DIAGNOSIS — T7840XA Allergy, unspecified, initial encounter: Secondary | ICD-10-CM

## 2020-12-12 MED ORDER — DEXAMETHASONE SODIUM PHOSPHATE 10 MG/ML IJ SOLN
10.0000 mg | Freq: Once | INTRAMUSCULAR | Status: AC
  Administered 2020-12-12: 10 mg via INTRAMUSCULAR

## 2020-12-12 MED ORDER — DEXAMETHASONE SODIUM PHOSPHATE 10 MG/ML IJ SOLN
INTRAMUSCULAR | Status: AC
  Filled 2020-12-12: qty 1

## 2020-12-12 NOTE — ED Triage Notes (Signed)
Pt reports being stung by a yellow jacket on bilateral hands yesterday. Swelling, redness, and itching noted to same. Reports shob onset this morning. PCP gave her benadryl and sent to ED for further eval.

## 2020-12-12 NOTE — ED Notes (Signed)
Pt left. Stated she was going to try urgent care.

## 2020-12-12 NOTE — Telephone Encounter (Signed)
Patient came into office with her L wrist and hand swollen and her R thumb swollen. Patient had been stung 2 times by yellow jackets. Patient reports feeling tight chested with some tingling. She has taken some antihistamines at home but they are not helping.   Called patients daughter who came to take patient to the ED for evaluation and treatment. Patient given Benadryl 50mg  in office per Lorrene Reid, PA.   AS, CMA

## 2020-12-12 NOTE — ED Provider Notes (Signed)
[facility]    CSN: [id number] Arrival date & time: 12/12/20  1320      History   Chief Complaint Chief Complaint  Patient presents with   Allergic Reaction   Insect Bite    Yellow jacket   Shortness of Breath    HPI Joy Williams is a 70 y.o. female.   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   Complication of anesthesia    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 [facility] (had to have botox injection)   COPD (chronic obstructive pulmonary disease) ([place])    Family history of adverse reaction to anesthesia    Mother- very sensitive to medication   Heart murmur    MVP   Hypothyroidism    Lung cancer ([place]) 12/30/2014   Synchronous primaries:  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, each treated by wedge resection with post-op adjuvant chemoRx at [facility]   MVP (mitral valve prolapse) 2018   s/p Minimally-Invasive Mitral Valve Replacement w/  [facility] Mitral bovine bioprosthetic tissue valve (size 40mm, model # 7300TFX, serial # [id number])   PONV (postoperative nausea and vomiting)    Raynaud's syndrome    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   31 mm [facility] mitral bovine bioprosthetic tissue valve placed via right mini thoracotomy approach   Severe mitral regurgitation 11/15/2014   Shortness of breath dyspnea    with exertion   STD (sexually transmitted  disease)    STEMI (ST elevation myocardial infarction) ([place]) 10/24/2020   Telangiectasia     Patient Active Problem List   Diagnosis Date Noted   Acute systolic CHF (congestive heart failure) ([place]) 10/25/2020   STEMI (ST elevation myocardial infarction) ([place]) 10/24/2020   Precordial chest pain 10/24/2020   Takotsubo cardiomyopathy    Closed fracture of phalanx of foot 08/17/2020   Symptomatic stenosis of right carotid artery 08/08/2020   TIA (transient ischemic attack) 08/06/2020   Anemia 08/06/2020   Encounter for counseling 02/08/2020   Suppression of immune system - subtherapeutic ([place]) 07/14/2019   History of TIA (transient ischemic attack) 05/10/2019   Non-ST elevated myocardial infarction ([place]) 02/25/2019   NSTEMI (non-ST elevated myocardial infarction) ([place]) 02/25/2019   Elevated troponin level not due to acute coronary syndrome    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   On statin therapy 04/27/2018   Memory loss or impairment 73/42/8768   Complicated migraine 11/57/2620   Hyperlipidemia LDL goal <70 03/12/2018   History of RPR test, 9/18 felt to be false positive 03/12/2018   Stroke-like episode ([place]) s/p tPA 03/11/2018   Dysuria 10/14/2017   Abnormal finding on urinalysis 10/14/2017   Pharyngitis 10/14/2017   Closed nondisplaced fracture of base of fifth metacarpal bone of right hand 05/27/2017   Decreased range of motion of right ankle 05/27/2017  Pain and swelling of right wrist 05/27/2017   Age-related osteoporosis without current pathological fracture 02/20/2017   ASCUS of cervix with negative high risk HPV 01/29/2017   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   Tobacco abuse 01/22/2017   Tobacco abuse counseling 01/22/2017   Neoplastic malignant related fatigue 01/22/2017   Vitamin D insufficiency 01/22/2017   Estrogen deficiency 01/14/2017   H/O cardiac catheterization- in prep for  lung ca sx 01/06/2017   Hypothyroidism 01/06/2017   Previous back surgeries 01/06/2017   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   Chronic diastolic congestive heart failure (HCC)    Chronic obstructive pulmonary disease (HCC)    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   Antineoplastic chemotherapy induced anemia 05/05/2015   History of epistaxis 03/30/2015   Raynaud's disease 01/19/2015   Malignant neoplasm of left lung ([place]) 12/30/2014   Lung cancer ([place]) 12/30/2014   Malignant neoplasm of overlapping sites of left lung ([place]) 12/06/2014   Nonintractable episodic headache 12/06/2014   Severe mitral regurgitation 11/15/2014   Scleroderma ([place]) 10/03/2014   Asthmatic bronchitis , chronic ([place]) 10/03/2014   Multiple pulmonary nodules 10/03/2014   Current Cigarette smoker 10/03/2014    Past Surgical History:  Procedure Laterality Date   BACK SURGERY     x 3  Disectomy   BREAST BIOPSY Left    CARDIAC CATHETERIZATION N/A 03/08/2016   Procedure: Right/Left Heart Cath and Coronary Angiography;  Surgeon: Sherren Mocha, MD;  Location: [facility];  Service: Cardiovascular;  Laterality: N/A;   CLAVICLE SURGERY Left 2013   plate to left collar bone   ENDARTERECTOMY Right 08/09/2020   Procedure: RIGHT CAROTID ENDARTERECTOMY;  Surgeon: Waynetta Sandy, MD;  Location: [facility];  Service: Vascular;  Laterality: Right;   LAPAROSCOPY     ? reason-age 25    LEFT HEART CATH AND CORONARY ANGIOGRAPHY N/A 02/25/2019   Procedure: LEFT HEART CATH AND CORONARY ANGIOGRAPHY;  Surgeon: Jettie Booze, MD;  Location: [facility];  Service: Cardiovascular;  Laterality: N/A;   LEFT HEART CATH AND CORONARY ANGIOGRAPHY N/A 10/24/2020   Procedure: LEFT HEART CATH AND CORONARY ANGIOGRAPHY;  Surgeon: Jettie Booze, MD;  Location: [facility];  Service: Cardiovascular;  Laterality: N/A;   LUNG CANCER SURGERY     MITRAL VALVE REPAIR  Right 06/27/2016   Procedure: MINIMALLY INVASIVE MITRAL VALVE REPLACEMENT;  Surgeon: Rexene Alberts, MD;  Location: [facility];  Service: Open Heart Surgery;  Laterality: Right;   TEE WITHOUT CARDIOVERSION N/A 02/22/2016   Procedure: TRANSESOPHAGEAL ECHOCARDIOGRAM (TEE);  Surgeon: Dorothy Spark, MD;  Location: [facility];  Service: Cardiovascular;  Laterality: N/A;   TEE WITHOUT CARDIOVERSION N/A 06/27/2016   Procedure: TRANSESOPHAGEAL ECHOCARDIOGRAM (TEE);  Surgeon: Rexene Alberts, MD;  Location: [facility];  Service: Open Heart Surgery;  Laterality: N/A;   TONSILLECTOMY     VIDEO ASSISTED THORACOSCOPY (VATS)/WEDGE RESECTION Left 12/30/2014   Bronchoscopy, Mediastinoscopy, Left VATS for Wedge resection LUL x2 adn LLL x1 - Dr. Elenor Quinones at [place] Left 2017   injected with botox   wrist surgery Left 2015   plate to wrist     OB History     Gravida  3   Para  2   Term  2   Preterm      AB  1   Living  2  SAB      IAB      Ectopic      Multiple      Live Births  2            Home Medications    Prior to Admission medications   Medication Sig Start Date End Date Taking? Authorizing Provider  acetaminophen (TYLENOL) 500 MG tablet Take 1,000 mg by mouth every 6 (six) hours as needed for mild pain or headache.    [provider]  clopidogrel (PLAVIX) 75 MG tablet Take 1 tablet (75 mg total) by mouth daily. 10/29/20   Kroeger, Lorelee Cover., PA-C  ferrous sulfate 325 (65 FE) MG tablet Take 1 tablet (325 mg total) by mouth daily. 10/29/20 10/29/21  Kroeger, Lorelee Cover., PA-C  Fluticasone-Umeclidin-Vilant (TRELEGY ELLIPTA) 100-62.5-25 MCG/INH AEPB TAKE 1 PUFF BY MOUTH EVERY DAY 09/13/20   Tanda Rockers, MD  levothyroxine (SYNTHROID) 175 MCG tablet TAKE 1 TABLET BY MOUTH DAILY BEFORE BREAKFAST Patient taking differently: Take 175 mcg by mouth daily before breakfast. 06/16/20   Abonza, Maritza, PA-C  MELATONIN PO Take 1 tablet by mouth at bedtime as needed  (sleep).    [provider]  Multiple Vitamins-Minerals (MULTIVITAMIN WITH MINERALS) tablet Take 1 tablet by mouth daily.    [provider]  nicotine (NICODERM CQ - DOSED IN MG/24 HR) 7 mg/24hr patch Place 1 patch (7 mg total) onto the skin daily. 10/29/20   Kroeger, Lorelee Cover., PA-C  rosuvastatin (CRESTOR) 20 MG tablet Take 1 tablet (20 mg total) by mouth daily. Patient taking differently: Take 20 mg by mouth every evening. 12/01/20   Lorrene Reid, PA-C  Turmeric 500 MG CAPS Take 500 mg by mouth daily.    [provider]  vitamin B-12 (CYANOCOBALAMIN) 1000 MCG tablet Take 1 tablet (1,000 mcg total) by mouth daily. 08/10/20   Geradine Girt, DO    Family History Family History  Problem Relation Age of Onset   Mitral valve prolapse Mother    Dementia Father    Prostate cancer Father    Mitral valve prolapse Brother    Mitral valve prolapse Sister    Colon cancer Neg Hx    Colon polyps Neg Hx    Rectal cancer Neg Hx    Esophageal cancer Neg Hx    Stomach cancer Neg Hx    Heart attack Neg Hx     Social History Social History   Tobacco Use   Smoking status: Former    Packs/day: 1.00    Years: 24.00    Pack years: 24.00    Types: E-cigarettes, Cigarettes    Start date: 05/21/1983   Smokeless tobacco: Never   Tobacco comments:    Stopped smoking 10/12/2020  Vaping Use   Vaping Use: Former   Quit date: 05/20/2006  Substance Use Topics   Alcohol use: Yes    Alcohol/week: 2.0 standard drinks    Types: 1 Shots of liquor, 1 Standard drinks or equivalent per week   Drug use: No     Allergies   Penicillins   Review of Systems Review of Systems  Constitutional:  Negative for activity change, appetite change, fatigue and fever.  HENT:  Negative for congestion.   Respiratory:  Negative for chest tightness, shortness of breath and wheezing.   Cardiovascular:  Negative for chest pain.  Gastrointestinal:  Negative for abdominal pain, diarrhea, nausea and  vomiting.  Musculoskeletal:        Right thumb and left wrist  swelling   Skin:  Positive for color change (redness and swelling).  Neurological:  Negative for dizziness.    Physical Exam Triage Vital Signs ED Triage Vitals  Enc Vitals Group     BP 12/12/20 1350 (!) 156/80     Pulse Rate 12/12/20 1350 74     Resp 12/12/20 1350 18     Temp 12/12/20 1350 (!) 97.5 F (36.4 C)     Temp Source 12/12/20 1350 Oral     SpO2 12/12/20 1350 97 %     Weight --      Height --      Head Circumference --      Peak Flow --      Pain Score 12/12/20 1347 10     Pain Loc --      Pain Edu? --      Excl. in [place]? --    No data found.  Updated Vital Signs BP (!) 156/80 (BP Location: Right Arm)   Pulse 74   Temp (!) 97.5 F (36.4 C) (Oral)   Resp 18   SpO2 97%   Visual Acuity Right Eye Distance:   Left Eye Distance:   Bilateral Distance:    Right Eye Near:   Left Eye Near:    Bilateral Near:     Physical Exam Constitutional:      General: She is not in acute distress.    Appearance: She is well-developed. She is not ill-appearing, toxic-appearing or diaphoretic.  HENT:     Head: Normocephalic and atraumatic.     Mouth/Throat:     Mouth: Mucous membranes are moist.     Pharynx: Oropharynx is clear.     Comments: Patent airway Cardiovascular:     Rate and Rhythm: Normal rate and regular rhythm.  Pulmonary:     Effort: Pulmonary effort is normal.     Breath sounds: Normal breath sounds. No decreased breath sounds, wheezing, rhonchi or rales.  Musculoskeletal:     Right hand: Normal strength. Normal sensation. Normal pulse.     Left hand: Normal strength. Normal sensation. Normal pulse.       Arms:     Comments: Flexion and extension intact in all fingers b/l  See images below  Skin:    General: Skin is warm and dry.  Neurological:     Mental Status: She is alert.        UC Treatments / Results  Labs (all labs ordered are listed, but only abnormal results are  displayed) Labs Reviewed - No data to display  EKG   Radiology No results found.  Procedures Procedures (including critical care time)  Medications Ordered in UC Medications  dexamethasone (DECADRON) injection 10 mg (10 mg Intramuscular Given 12/12/20 1416)    Initial Impression / Assessment and Plan / UC Course  I have reviewed the triage vital signs and the nursing notes.  Pertinent labs & imaging results that were available during my care of the patient were reviewed by me and considered in my medical decision making (see chart for details).     Patient is a 70 year old female with past medical history significant for lung cancer, scleroderma, asthma, hypothyroidism, HFpEF, prior NSTEMI, TIA, who presents with large local reaction on right hand and left wrist after yellowjacket sting yesterday.  She has no systemic signs or symptoms to suggest anaphylaxis.  Her vital signs are stable.  No evidence of infection, she is not having any fevers.  She has used antihistamine without  improvement.  Her lungs are reassuringly clear on examination today.  We will give her an IM dose of dexamethasone to treat her local reaction and she can continue to take an antihistamine over-the-counter, advised use of Zyrtec or Claritin.  Advised her to avoid Benadryl as this can make her sleepy, especially in a patient over the age of 93.  Return precautions discussed, see AVS.  She was discharged home in stable condition. Final Clinical Impressions(s) / UC Diagnoses   Final diagnoses:  Allergic reaction, initial encounter     Discharge Instructions      You have a large local allergic reaction in the area of your stings.  You were given steroids today and should start to feel better soon.    You can continue to take over-the-counter zyrtec or claritin.  Avoid benadryl as it can make you sleepy.  If you have difficulty breathing, you should be see at the Emergency Room right away.  If your pain or  swelling worsens, you develop fever, you do not have improvement over the next 5 days, you should be seen by your doctor.  If you have fevers, worsening pain, worsening redness, or pus, you may have an infection and should be seen right away.     ED Prescriptions   None    PDMP not reviewed this encounter.   MeccarielloBernita Raisin, DO 12/12/20 1425

## 2020-12-12 NOTE — ED Provider Notes (Signed)
Emergency Medicine Provider Triage Evaluation Note  Joy Williams , a 70 y.o. female  was evaluated in triage.  Pt complains of bilateral hand swelling after yellow jacket stings yesterday around 12:30p. Worsening overnight. Applying topical antihistamine and took oral antihistamine. Mild SOB.  Review of Systems  Positive: SOB, swelling Negative:   Physical Exam  BP (!) 110/57 (BP Location: Right Arm)   Pulse 65   Temp 98 F (36.7 C) (Oral)   Resp 15   SpO2 100%  Gen:   Awake, no distress   Resp:  Normal effort  MSK:   Moves extremities without difficulty  Other:  Generalized edema, swelling L hand, mild swelling R palm  Medical Decision Making  Medically screening exam initiated at 11:13 AM.  Appropriate orders placed.  Joy Williams was informed that the remainder of the evaluation will be completed by another provider, this initial triage assessment does not replace that evaluation, and the importance of remaining in the ED until their evaluation is complete.     Joy Cater, PA-C 12/12/20 1115    Joy Saver, MD 12/14/20 1314

## 2020-12-12 NOTE — ED Triage Notes (Signed)
Pt c/o SOB since this morning.   States she was stung by a yellow jacket twice.  Presents with swelling on the left arm and the right thumb.   Pt states she took 50 mg of Benadryl this morning as told by her PCP.

## 2020-12-12 NOTE — Discharge Instructions (Addendum)
You have a large local allergic reaction in the area of your stings.  You were given steroids today and should start to feel better soon.    You can continue to take over-the-counter zyrtec or claritin.  Avoid benadryl as it can make you sleepy.  If you have difficulty breathing, you should be see at the Emergency Room right away.  If your pain or swelling worsens, you develop fever, you do not have improvement over the next 5 days, you should be seen by your doctor.  If you have fevers, worsening pain, worsening redness, or pus, you may have an infection and should be seen right away.

## 2020-12-13 ENCOUNTER — Encounter: Payer: Self-pay | Admitting: Nurse Practitioner

## 2020-12-13 ENCOUNTER — Ambulatory Visit (INDEPENDENT_AMBULATORY_CARE_PROVIDER_SITE_OTHER): Payer: Medicare Other | Admitting: Nurse Practitioner

## 2020-12-13 ENCOUNTER — Ambulatory Visit: Payer: Medicare Other | Admitting: Rheumatology

## 2020-12-13 VITALS — BP 124/60 | HR 79 | Temp 98.6°F | Ht 66.5 in | Wt 136.1 lb

## 2020-12-13 DIAGNOSIS — Z9889 Other specified postprocedural states: Secondary | ICD-10-CM

## 2020-12-13 DIAGNOSIS — I214 Non-ST elevation (NSTEMI) myocardial infarction: Secondary | ICD-10-CM

## 2020-12-13 DIAGNOSIS — C3482 Malignant neoplasm of overlapping sites of left bronchus and lung: Secondary | ICD-10-CM

## 2020-12-13 DIAGNOSIS — Z953 Presence of xenogenic heart valve: Secondary | ICD-10-CM

## 2020-12-13 DIAGNOSIS — R87610 Atypical squamous cells of undetermined significance on cytologic smear of cervix (ASC-US): Secondary | ICD-10-CM

## 2020-12-13 DIAGNOSIS — I5032 Chronic diastolic (congestive) heart failure: Secondary | ICD-10-CM

## 2020-12-13 DIAGNOSIS — M810 Age-related osteoporosis without current pathological fracture: Secondary | ICD-10-CM

## 2020-12-13 DIAGNOSIS — B009 Herpesviral infection, unspecified: Secondary | ICD-10-CM

## 2020-12-13 DIAGNOSIS — Z720 Tobacco use: Secondary | ICD-10-CM

## 2020-12-13 DIAGNOSIS — Z91030 Bee allergy status: Secondary | ICD-10-CM | POA: Diagnosis not present

## 2020-12-13 DIAGNOSIS — M349 Systemic sclerosis, unspecified: Secondary | ICD-10-CM

## 2020-12-13 DIAGNOSIS — I7300 Raynaud's syndrome without gangrene: Secondary | ICD-10-CM

## 2020-12-13 DIAGNOSIS — L299 Pruritus, unspecified: Secondary | ICD-10-CM | POA: Diagnosis not present

## 2020-12-13 DIAGNOSIS — R053 Chronic cough: Secondary | ICD-10-CM

## 2020-12-13 DIAGNOSIS — J849 Interstitial pulmonary disease, unspecified: Secondary | ICD-10-CM

## 2020-12-13 DIAGNOSIS — G43109 Migraine with aura, not intractable, without status migrainosus: Secondary | ICD-10-CM

## 2020-12-13 DIAGNOSIS — I6521 Occlusion and stenosis of right carotid artery: Secondary | ICD-10-CM

## 2020-12-13 DIAGNOSIS — G459 Transient cerebral ischemic attack, unspecified: Secondary | ICD-10-CM

## 2020-12-13 DIAGNOSIS — R918 Other nonspecific abnormal finding of lung field: Secondary | ICD-10-CM

## 2020-12-13 DIAGNOSIS — I5181 Takotsubo syndrome: Secondary | ICD-10-CM

## 2020-12-13 DIAGNOSIS — E559 Vitamin D deficiency, unspecified: Secondary | ICD-10-CM

## 2020-12-13 DIAGNOSIS — R768 Other specified abnormal immunological findings in serum: Secondary | ICD-10-CM

## 2020-12-13 DIAGNOSIS — Z8639 Personal history of other endocrine, nutritional and metabolic disease: Secondary | ICD-10-CM

## 2020-12-13 DIAGNOSIS — D6481 Anemia due to antineoplastic chemotherapy: Secondary | ICD-10-CM

## 2020-12-13 DIAGNOSIS — Z8709 Personal history of other diseases of the respiratory system: Secondary | ICD-10-CM

## 2020-12-13 DIAGNOSIS — Z79899 Other long term (current) drug therapy: Secondary | ICD-10-CM

## 2020-12-13 MED ORDER — EPINEPHRINE 0.3 MG/0.3ML IJ SOAJ
0.3000 mg | INTRAMUSCULAR | 1 refills | Status: DC | PRN
Start: 2020-12-13 — End: 2023-08-19

## 2020-12-13 MED ORDER — HYDROXYZINE HCL 25 MG PO TABS
ORAL_TABLET | ORAL | 1 refills | Status: DC
Start: 2020-12-13 — End: 2021-01-29

## 2020-12-13 MED ORDER — PREDNISONE 10 MG (21) PO TBPK: ORAL_TABLET | ORAL | 0 refills | Status: DC

## 2020-12-13 NOTE — Progress Notes (Signed)
Virtual Visit via Telephone Note  I connected with Joy Williams on 12/13/20 at  2:10 PM EDT by telephone and verified that I am speaking with the correct person using two identifiers.  Location: Patient: home Provider: [facility]     I discussed the limitations, risks, security and privacy concerns of performing an evaluation and management service by telephone and the availability of in person appointments. I also discussed with the patient that there may be a patient responsible charge related to this service. The patient expressed understanding and agreed to proceed.   History of Present Illness: The patient states that on Monday afternoon, while mowing her lawn, she was stung twice by [employer] bees.  She was stung once on her right thumb and second time on left wrist.  She states that swelling on both arms got significant and very itchy.  She did go to urgent care.  Photos of the stainings and swelling were taken and uploaded to her chart.  She was given an injection of Decadron.  She was encouraged to take Claritin or Zyrtec daily.  She was encouraged to contact her family provider if symptoms do not get better or get worse over the next 48 to 72 hours.  She states that swelling is slightly improved.  She states the itching of both arms is severe.  States that she is unable to sleep because itching is so significant.  She states that this morning, she developed scratchy throat, ear fullness, and low-grade fever.  She did take a home test for COVID-19 this morning, she states "just to be safe."  Results were negative.  She denies chest pain, chest pressure, or shortness of breath.  She denies difficulty swallowing.  She denies headache.  She denies nausea, vomiting, or diarrhea.  Observations/Objective:  Today's Vitals   12/13/20 1347  BP: 124/60  Pulse: 79  Temp: 98.6 F (37 C)  Weight: 136 lb 1.9 oz (61.7 kg)   Body mass index is 21.64 kg/m.     The patient is alert and oriented. She is pleasant and answers all questions appropriately. Breathing is non-labored. She is in no acute distress at this time.    Assessment and Plan: 1. Bee sting allergy Will start prednisone taper.  Take as directed for 6 days.  Add hydroxyzine 25 mg tablets.  Recommend she take 1/2 to 1 tablet twice daily as needed for severe itching.  Reviewed potential side effects, including dizziness and drowsiness.  Advised her not to take this medication if she had to drive or go to work.  She voiced understanding and agreement.  New prescription for EpiPen was sent to her pharmacy as well.  She should carry it with her at all times and use when needed for bee sting. - predniSONE (STERAPRED UNI-PAK 21 TAB) 10 MG (21) TBPK tablet; 6 day taper - take by mouth as directed for 6 days  Dispense: 21 tablet; Refill: 0 - EPINEPHrine (EPIPEN 2-PAK) 0.3 mg/0.3 mL IJ SOAJ injection; Inject 0.3 mg into the muscle as needed for anaphylaxis.  Dispense: 1 each; Refill: 1 - hydrOXYzine (ATARAX/VISTARIL) 25 MG tablet; Take 1/2 to 1 tablet po BID prn itching  Dispense: 45 tablet; Refill: 1  2. Pruritus Will add hydroxyzine 25 mg tablets. Recommend she take 1/2 to 1 tablet twice daily as needed for severe itching.  Reviewed potential side effects, including dizziness and drowsiness.  Advised her not to take this medication if she had  to drive or go to work.  She voiced understanding and agreement.  - hydrOXYzine (ATARAX/VISTARIL) 25 MG tablet; Take 1/2 to 1 tablet po BID prn itching  Dispense: 45 tablet; Refill: 1   Follow Up Instructions:    I discussed the assessment and treatment plan with the patient. The patient was provided an opportunity to ask questions and all were answered. The patient agreed with the plan and demonstrated an understanding of the instructions.   The patient was advised to call back or seek an in-person evaluation if the symptoms worsen or if the condition  fails to improve as anticipated.  I provided 20 minutes of non-face-to-face time during this encounter.  This note was dictated using Systems analyst. Rapid proofreading was performed to expedite the delivery of the information. Despite proofreading, phonetic errors will occur which are common with this voice recognition software. Please take this into consideration. If there are any concerns, please contact our office.    Ronnell Freshwater, NP

## 2020-12-14 ENCOUNTER — Encounter (HOSPITAL_COMMUNITY): Payer: Self-pay

## 2020-12-18 ENCOUNTER — Other Ambulatory Visit (HOSPITAL_COMMUNITY): Payer: Medicare Other

## 2020-12-19 ENCOUNTER — Other Ambulatory Visit: Payer: Self-pay

## 2020-12-19 ENCOUNTER — Ambulatory Visit (INDEPENDENT_AMBULATORY_CARE_PROVIDER_SITE_OTHER): Payer: Medicare Other | Admitting: Physician Assistant

## 2020-12-19 ENCOUNTER — Encounter: Payer: Self-pay | Admitting: Physician Assistant

## 2020-12-19 VITALS — BP 101/68 | HR 78 | Temp 98.3°F | Ht 66.0 in | Wt 137.6 lb

## 2020-12-19 DIAGNOSIS — Z0000 Encounter for general adult medical examination without abnormal findings: Secondary | ICD-10-CM | POA: Diagnosis not present

## 2020-12-19 DIAGNOSIS — Z1231 Encounter for screening mammogram for malignant neoplasm of breast: Secondary | ICD-10-CM | POA: Diagnosis not present

## 2020-12-19 NOTE — Progress Notes (Signed)
Subjective:   Joy Williams is a 70 y.o. female who presents for Medicare Annual (Subsequent) preventive examination.  Review of Systems    General:   No F/C, wt loss Pulm:   No DIB, SOB, pleuritic chest pain Card:  No CP, palpitations Abd:  No n/v/d or pain Ext:  No inc edema from baseline     Objective:    Today's Vitals   12/19/20 0944  BP: 101/68  Pulse: 78  Temp: 98.3 F (36.8 C)  SpO2: 99%  Weight: 137 lb 9.6 oz (62.4 kg)  Height: 5\' 6"  (1.676 m)   Body mass index is 22.21 kg/m.  Advanced Directives 11/03/2020 10/25/2020 08/07/2020 08/06/2020 02/24/2019 03/11/2018 03/11/2018  Does Patient Have a Medical Advance Directive? No No Yes No Yes Yes No  Type of Advance Directive - Public librarian;Living will - [place];Living will Living will -  Does patient want to make changes to medical advance directive? - - No - Patient declined - - No - Patient declined -  Copy of [place] in Chart? - - Yes - validated most recent copy scanned in chart (See row information) - No - copy requested, Physician notified - -  Would patient like information on creating a medical advance directive? No - Patient declined No - Patient declined No - Patient declined - No - Patient declined - No - Patient declined    Current Medications (verified) Outpatient Encounter Medications as of 12/19/2020  Medication Sig   acetaminophen (TYLENOL) 500 MG tablet Take 1,000 mg by mouth every 6 (six) hours as needed for mild pain or headache.   clopidogrel (PLAVIX) 75 MG tablet Take 1 tablet (75 mg total) by mouth daily.   EPINEPHrine (EPIPEN 2-PAK) 0.3 mg/0.3 mL IJ SOAJ injection Inject 0.3 mg into the muscle as needed for anaphylaxis.   ferrous sulfate 325 (65 FE) MG tablet Take 1 tablet (325 mg total) by mouth daily.   Fluticasone-Umeclidin-Vilant (TRELEGY ELLIPTA) 100-62.5-25 MCG/INH AEPB TAKE 1 PUFF BY MOUTH EVERY DAY   hydrOXYzine (ATARAX/VISTARIL) 25  MG tablet Take 1/2 to 1 tablet po BID prn itching   levothyroxine (SYNTHROID) 175 MCG tablet TAKE 1 TABLET BY MOUTH DAILY BEFORE BREAKFAST (Patient taking differently: Take 175 mcg by mouth daily before breakfast.)   MELATONIN PO Take 1 tablet by mouth at bedtime as needed (sleep).   Multiple Vitamins-Minerals (MULTIVITAMIN WITH MINERALS) tablet Take 1 tablet by mouth daily.   nicotine (NICODERM CQ - DOSED IN MG/24 HR) 7 mg/24hr patch Place 1 patch (7 mg total) onto the skin daily.   predniSONE (STERAPRED UNI-PAK 21 TAB) 10 MG (21) TBPK tablet 6 day taper - take by mouth as directed for 6 days   rosuvastatin (CRESTOR) 20 MG tablet Take 1 tablet (20 mg total) by mouth daily. (Patient taking differently: Take 20 mg by mouth every evening.)   Turmeric 500 MG CAPS Take 500 mg by mouth daily.   vitamin B-12 (CYANOCOBALAMIN) 1000 MCG tablet Take 1 tablet (1,000 mcg total) by mouth daily.   No facility-administered encounter medications on file as of 12/19/2020.    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 [facility] (had to have botox injection)   COPD (chronic obstructive pulmonary disease) ([place])    Family history of adverse reaction to anesthesia    Mother- very sensitive to medication   Heart murmur    MVP   Hypothyroidism    Lung cancer ([place]) 12/30/2014   Synchronous primaries:  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, each treated by wedge resection with post-op adjuvant chemoRx at [facility]   MVP (mitral valve prolapse) 2018   s/p Minimally-Invasive Mitral Valve Replacement w/  [facility] Mitral bovine bioprosthetic tissue valve (size 45mm, model # 7300TFX,  serial # [id number])   PONV (postoperative nausea and vomiting)    Raynaud's syndrome    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   31 mm [facility] mitral bovine bioprosthetic tissue valve placed via right mini thoracotomy approach   Severe mitral regurgitation 11/15/2014   Shortness of breath dyspnea    with exertion   STD (sexually transmitted disease)    STEMI (ST elevation myocardial infarction) ([place]) 10/24/2020   Telangiectasia    Past Surgical History:  Procedure Laterality Date   BACK SURGERY     x 3  Disectomy   BREAST BIOPSY Left    CARDIAC CATHETERIZATION N/A 03/08/2016   Procedure: Right/Left Heart Cath and Coronary Angiography;  Surgeon: Sherren Mocha, MD;  Location: [place] [facility];  Service: Cardiovascular;  Laterality: N/A;   CLAVICLE SURGERY Left 2013   plate to left collar bone   ENDARTERECTOMY Right 08/09/2020   Procedure: RIGHT CAROTID ENDARTERECTOMY;  Surgeon: Waynetta Sandy, MD;  Location: [place];  Service: Vascular;  Laterality: Right;   LAPAROSCOPY     ? reason-age 89    LEFT HEART CATH AND CORONARY ANGIOGRAPHY N/A 02/25/2019   Procedure: LEFT HEART CATH AND CORONARY ANGIOGRAPHY;  Surgeon: Jettie Booze, MD;  Location: [place] [facility];  Service: Cardiovascular;  Laterality: N/A;   LEFT HEART CATH AND CORONARY ANGIOGRAPHY N/A 10/24/2020   Procedure: LEFT HEART CATH AND CORONARY ANGIOGRAPHY;  Surgeon: Jettie Booze, MD;  Location: [place] [facility];  Service: Cardiovascular;  Laterality: N/A;   LUNG CANCER SURGERY     MITRAL VALVE REPAIR Right 06/27/2016   Procedure: MINIMALLY INVASIVE MITRAL VALVE REPLACEMENT;  Surgeon: Rexene Alberts, MD;  Location: [place];  Service: Open Heart Surgery;  Laterality: Right;   TEE WITHOUT CARDIOVERSION N/A 02/22/2016   Procedure: TRANSESOPHAGEAL ECHOCARDIOGRAM (TEE);  Surgeon: Dorothy Spark, MD;  Location: [place];  Service: Cardiovascular;  Laterality: N/A;    TEE WITHOUT CARDIOVERSION N/A 06/27/2016   Procedure: TRANSESOPHAGEAL ECHOCARDIOGRAM (TEE);  Surgeon: Rexene Alberts, MD;  Location: [place];  Service: Open Heart Surgery;  Laterality: N/A;   TONSILLECTOMY     VIDEO ASSISTED THORACOSCOPY (VATS)/WEDGE RESECTION Left 12/30/2014   Bronchoscopy, Mediastinoscopy, Left VATS for Wedge resection LUL x2 adn LLL x1 - Dr. Elenor Quinones at [place] Left 2017   injected with botox   wrist surgery Left 2015   plate to wrist    Family History  Problem Relation Age of Onset   Mitral valve prolapse Mother    Dementia Father    Prostate cancer Father    Mitral valve prolapse Brother    Mitral valve prolapse Sister    Colon cancer Neg Hx    Colon polyps Neg Hx  Rectal cancer Neg Hx    Esophageal cancer Neg Hx    Stomach cancer Neg Hx    Heart attack Neg Hx    Social History   Socioeconomic History   Marital status: Divorced    Spouse name: Not on file   Number of children: Not on file   Years of education: 16   Highest education level: Not on file  Occupational History   Occupation: Retired  Tobacco Use   Smoking status: Former    Packs/day: 1.00    Years: 24.00    Pack years: 24.00    Types: E-cigarettes, Cigarettes    Start date: 05/21/1983   Smokeless tobacco: Never   Tobacco comments:    Stopped smoking 10/12/2020  Vaping Use   Vaping Use: Former   Quit date: 05/20/2006  Substance and Sexual Activity   Alcohol use: Yes    Alcohol/week: 2.0 standard drinks    Types: 1 Shots of liquor, 1 Standard drinks or equivalent per week   Drug use: No   Sexual activity: Not Currently    Partners: Male    Birth control/protection: Post-menopausal  Other Topics Concern   Not on file  Social History Narrative   Not on file   Social Determinants of Health   Financial Resource Strain: Low Risk    Difficulty of Paying Living Expenses: Not very hard  Food Insecurity: No Food Insecurity   Worried About Charity fundraiser in  the Last Year: Never true   Arboriculturist in the Last Year: Never true  Transportation Needs: Unmet Transportation Needs   Lack of Transportation (Medical): No   Lack of Transportation (Non-Medical): Yes  Physical Activity: Not on file  Stress: Not on file  Social Connections: Not on file    Tobacco Counseling Counseling given: Not Answered Tobacco comments: Stopped smoking 10/12/2020    Diabetic?no  Activities of Daily Living In your present state of health, do you have any difficulty performing the following activities: 12/19/2020 11/30/2020  Hearing? Tempie Donning  Vision? Y Y  Difficulty concentrating or making decisions? N N  Walking or climbing stairs? N N  Dressing or bathing? N N  Doing errands, shopping? N N  Some recent data might be hidden    Patient Care Team: Lorrene Reid, PA-C as PCP - General Freada Bergeron, MD as PCP - Cardiology (Cardiology) Tanda Rockers, MD as Consulting Physician (Pulmonary Disease) Hurley Cisco, MD as Consulting Physician (Rheumatology) Lerry Paterson, MD as Referring Physician Lissa Morales, MD as Referring Physician (Internal Medicine) [name], MD as Consulting Physician (Infectious Diseases) [name], Rounding, MD as Rounding Team (Cardiology)  Indicate any recent Medical Services you may have received from other than [facility] providers in the past year (date may be approximate).     Assessment:   This is a routine wellness examination for [place].  Hearing/Vision screen No results found.  Dietary issues and exercise activities discussed: -Continue with low fat diet, and good hydration.   Goals Addressed   None   Depression Screen PHQ 2/9 Scores 12/19/2020 11/30/2020 08/17/2020 06/15/2020 04/19/2020 12/15/2019 09/30/2019  PHQ - 2 Score 0 0 0 0 0 0 0  PHQ- 9 Score 1 0 0 0 2 0 0    Fall Risk Fall Risk  12/19/2020 11/30/2020 08/17/2020 06/15/2020 04/19/2020  Falls in the past year? 0 0 0 0 1  Number falls in past  yr: 0 0 - - 0  Injury with Fall? 0 0 - -  1  Risk for fall due to : No Fall Risks - - - -  Follow up Falls evaluation completed Falls evaluation completed Falls evaluation completed Falls evaluation completed Falls evaluation completed    [place]:  Any stairs in or around the home? Yes  If so, are there any without handrails? No  Home free of loose throw rugs in walkways, pet beds, electrical cords, etc? Yes  Adequate lighting in your home to reduce risk of falls? Yes   ASSISTIVE DEVICES UTILIZED TO PREVENT FALLS:  Life alert? Yes  Use of a cane, walker or w/c? No  Grab bars in the bathroom? No  Shower chair or bench in shower? No  Elevated toilet seat or a handicapped toilet? No   TIMED UP AND GO:  Was the test performed? Yes .  Length of time to ambulate 10 feet: 12 sec.   Gait steady and fast without use of assistive device  Cognitive Function: stable     6CIT Screen 12/19/2020 12/15/2019 04/27/2018 01/22/2017  What Year? 0 points 0 points 0 points 0 points  What month? 0 points 0 points 0 points 0 points  What time? 0 points 0 points 0 points 0 points  Count back from 20 0 points 0 points 0 points 0 points  Months in reverse 0 points 0 points 0 points 0 points  Repeat phrase 2 points 2 points 2 points 0 points  Total Score 2 2 2  0    Immunizations Immunization History  Administered Date(s) Administered   Fluad Quad(high Dose 65+) 02/18/2019   H1N1 04/13/2008   Influenza Split 02/18/2015   Influenza, High Dose Seasonal PF 02/20/2017, 04/27/2018   Influenza,inj,Quad PF,6+ Mos 03/16/2015   Influenza-Unspecified 04/12/2008, 02/18/2011   Moderna Sars-Covid-2 Vaccination 06/21/2019, 07/18/2019, 01/25/2020   Pneumococcal Conjugate-13 01/22/2017   Tdap 09/26/2015   Zoster Recombinat (Shingrix) 05/10/2018, 03/03/2019    TDAP status: Up to date  Flu Vaccine status: Up to date  Pneumococcal vaccine status: Up to date  Covid-19  vaccine status: Completed vaccines  Qualifies for Shingles Vaccine? Yes   Zostavax completed No   Shingrix Completed?: Yes  Screening Tests Health Maintenance  Topic Date Due   COVID-19 Vaccine (4 - Booster for Moderna series) 04/25/2020   MAMMOGRAM  07/02/2020   INFLUENZA VACCINE  12/18/2020   COLONOSCOPY (Pts 45-51yrs Insurance coverage will need to be confirmed)  04/19/2021 (Originally 02/11/1996)   PNA vac Low Risk Adult (2 of 2 - PPSV23) 04/19/2021 (Originally 01/22/2018)   TETANUS/TDAP  09/25/2025   DEXA SCAN  Completed   Hepatitis C Screening  Completed   Zoster Vaccines- Shingrix  Completed   HPV VACCINES  Aged Out    Health Maintenance  Health Maintenance Due  Topic Date Due   COVID-19 Vaccine (4 - Booster for Moderna series) 04/25/2020   MAMMOGRAM  07/02/2020   INFLUENZA VACCINE  12/18/2020    Colorectal cancer screening: Referral to GI placed 12/19/2020. Pt aware the office will call re: appt.  Mammogram status: Ordered 12/19/2020. Pt provided with contact info and advised to call to schedule appt.   Bone Density status: Ordered pt scheduled 02/27/2021. Pt provided with contact info and advised to call to schedule appt.  Lung Cancer Screening: (Low Dose CT Chest recommended if Age 81-80 years, 30 pack-year currently smoking OR have quit w/in 15years.) does not qualify.   Lung Cancer Screening Referral: n/a  Additional Screening:  Hepatitis C Screening: does qualify; Completed  01/22/2017  Vision Screening: Recommended annual ophthalmology exams for early detection of glaucoma and other disorders of the eye. Is the patient up to date with their annual eye exam?  Yes  Who is the provider or what is the name of the office in which the patient attends annual eye exams?  If pt is not established with a provider, would they like to be referred to a provider to establish care? No .   Dental Screening: Recommended annual dental exams for proper oral hygiene  Community  Resource Referral / Chronic Care Management: CRR required this visit?  No   CCM required this visit?  No      Plan:  -Continue to follow up with various specialists. -Continue to monitor BP at home. BP stable. -Recommend to take oral antihistamine (Claritin). -Continue tobacco cessation. -Follow up in 6 months for HLD, thyroid and FBW few days prior   I have personally reviewed and noted the following in the patient's chart:   Medical and social history Use of alcohol, tobacco or illicit drugs  Current medications and supplements including opioid prescriptions.  Functional ability and status Nutritional status Physical activity Advanced directives List of other physicians Hospitalizations, surgeries, and ER visits in previous 12 months Vitals Screenings to include cognitive, depression, and falls Referrals and appointments  In addition, I have reviewed and discussed with patient certain preventive protocols, quality metrics, and best practice recommendations. A written personalized care plan for preventive services as well as general preventive health recommendations were provided to patient.     Lorrene Reid, PA-C   12/20/2020

## 2020-12-19 NOTE — Patient Instructions (Signed)
Preventive Care 70 Years and Older, Female Preventive care refers to lifestyle choices and visits with your health care provider that can promote health and wellness. This includes: A yearly physical exam. This is also called an annual wellness visit. Regular dental and eye exams. Immunizations. Screening for certain conditions. Healthy lifestyle choices, such as: Eating a healthy diet. Getting regular exercise. Not using drugs or products that contain nicotine and tobacco. Limiting alcohol use. What can I expect for my preventive care visit? Physical exam Your health care provider will check your: Height and weight. These may be used to calculate your BMI (body mass index). BMI is a measurement that tells if you are at a healthy weight. Heart rate and blood pressure. Body temperature. Skin for abnormal spots. Counseling Your health care provider may ask you questions about your: Past medical problems. Family's medical history. Alcohol, tobacco, and drug use. Emotional well-being. Home life and relationship well-being. Sexual activity. Diet, exercise, and sleep habits. History of falls. Memory and ability to understand (cognition). Work and work Statistician. Pregnancy and menstrual history. Access to firearms. What immunizations do I need?  Vaccines are usually given at various ages, according to a schedule. Your health care provider will recommend vaccines for you based on your age, medicalhistory, and lifestyle or other factors, such as travel or where you work. What tests do I need? Blood tests Lipid and cholesterol levels. These may be checked every 5 years, or more often depending on your overall health. Hepatitis C test. Hepatitis B test. Screening Lung cancer screening. You may have this screening every year starting at age 44 if you have a 30-pack-year history of smoking and currently smoke or have quit within the past 15 years. Colorectal cancer screening. All  adults should have this screening starting at age 39 and continuing until age 65. Your health care provider may recommend screening at age 61 if you are at increased risk. You will have tests every 1-10 years, depending on your results and the type of screening test. Diabetes screening. This is done by checking your blood sugar (glucose) after you have not eaten for a while (fasting). You may have this done every 1-3 years. Mammogram. This may be done every 1-2 years. Talk with your health care provider about how often you should have regular mammograms. Abdominal aortic aneurysm (AAA) screening. You may need this if you are a current or former smoker. BRCA-related cancer screening. This may be done if you have a family history of breast, ovarian, tubal, or peritoneal cancers. Other tests STD (sexually transmitted disease) testing, if you are at risk. Bone density scan. This is done to screen for osteoporosis. You may have this done starting at age 54. Talk with your health care provider about your test results, treatment options,and if necessary, the need for more tests. Follow these instructions at home: Eating and drinking  Eat a diet that includes fresh fruits and vegetables, whole grains, lean protein, and low-fat dairy products. Limit your intake of foods with high amounts of sugar, saturated fats, and salt. Take vitamin and mineral supplements as recommended by your health care provider. Do not drink alcohol if your health care provider tells you not to drink. If you drink alcohol: Limit how much you have to 0-1 drink a day. Be aware of how much alcohol is in your drink. In the [place] one drink equals one 12 oz bottle of beer (355 mL), one 5 oz glass of wine (148 mL), or one 1  oz glass of hard liquor (44 mL).  Lifestyle Take daily care of your teeth and gums. Brush your teeth every morning and night with fluoride toothpaste. Floss one time each day. Stay active. Exercise for at  least 30 minutes 5 or more days each week. Do not use any products that contain nicotine or tobacco, such as cigarettes, e-cigarettes, and chewing tobacco. If you need help quitting, ask your health care provider. Do not use drugs. If you are sexually active, practice safe sex. Use a condom or other form of protection in order to prevent STIs (sexually transmitted infections). Talk with your health care provider about taking a low-dose aspirin or statin. Find healthy ways to cope with stress, such as: Meditation, yoga, or listening to music. Journaling. Talking to a trusted person. Spending time with friends and family. Safety Always wear your seat belt while driving or riding in a vehicle. Do not drive: If you have been drinking alcohol. Do not ride with someone who has been drinking. When you are tired or distracted. While texting. Wear a helmet and other protective equipment during sports activities. If you have firearms in your house, make sure you follow all gun safety procedures. What's next? Visit your health care provider once a year for an annual wellness visit. Ask your health care provider how often you should have your eyes and teeth checked. Stay up to date on all vaccines. This information is not intended to replace advice given to you by your health care provider. Make sure you discuss any questions you have with your healthcare provider. Document Revised: 04/26/2020 Document Reviewed: 04/30/2018 Elsevier Patient Education  2022 [employer].

## 2021-01-02 ENCOUNTER — Ambulatory Visit: Payer: Medicare Other | Admitting: Internal Medicine

## 2021-01-05 ENCOUNTER — Telehealth (HOSPITAL_COMMUNITY): Payer: Self-pay

## 2021-01-05 NOTE — Telephone Encounter (Signed)
Patient called and was interested in participating in the Cardiac Rehab Program. Patient will come in for orientation on 02/13/2021@8 :30am and will attend the 9:15am exercise class.   Tourist information centre manager.

## 2021-01-05 NOTE — Telephone Encounter (Signed)
Kx Mod needed for cardiac rehab after completion of session 13

## 2021-01-09 ENCOUNTER — Ambulatory Visit: Payer: Medicare Other | Admitting: Rheumatology

## 2021-01-11 ENCOUNTER — Other Ambulatory Visit: Payer: Self-pay | Admitting: Physician Assistant

## 2021-01-11 DIAGNOSIS — E039 Hypothyroidism, unspecified: Secondary | ICD-10-CM

## 2021-01-15 ENCOUNTER — Other Ambulatory Visit (HOSPITAL_COMMUNITY): Payer: Self-pay

## 2021-01-24 NOTE — Progress Notes (Addendum)
Office Visit Note  Patient: Joy Williams             Date of Birth: [birth year]           MRN: [id number]             PCP: Lorrene Reid, PA-C Referring: Brand Males, MD Visit Date: 01/25/2021 Occupation: _0 @  Subjective:  ILD and Raynauds.   History of Present Illness: Joy Williams is a 70 y.o. female seen in consultation per request of Dr. Chase Caller.  According to the patient she developed lung cancer in 2016 for which she was under care of an oncologist at [facility].  At the time she was experiencing Raynauds symptoms.  She was referred to a rheumatologist in [place] who evaluated her and told her that she did not have a scleroderma.  She has had Raynauds for many years and also she has noticed telangiectasias on her face.  She has not noticed any skin tightness there is no history of inflammatory arthritis.  She is a chronic a smoker.  She has seen a pulmonologist for many years.  She was recently referred to Dr. Chase Caller as her CT scan of the chest showed pulmonary fibrosis.  She continues to have Raynaud's phenomenon more prominent in her hands and her feet.  There is no family history of autoimmune disease.  She is gravida 3, para 2, abortion 1, no history of DVTs.  Activities of Daily Living:  Patient reports morning stiffness for 0 minutes.   Patient Denies nocturnal pain.  Difficulty dressing/grooming: Denies Difficulty climbing stairs: Denies Difficulty getting out of chair: Denies Difficulty using hands for taps, buttons, cutlery, and/or writing: Denies  Review of Systems  Constitutional:  Negative for fatigue.  HENT:  Negative for mouth sores, mouth dryness and nose dryness.   Eyes:  Negative for pain, itching and dryness.  Respiratory:  Negative for shortness of breath and difficulty breathing.   Cardiovascular:  Negative for chest pain and palpitations.  Gastrointestinal:  Negative for blood in stool, constipation and diarrhea.  Endocrine: Negative  for increased urination.  Genitourinary:  Negative for difficulty urinating.  Musculoskeletal:  Negative for joint pain, joint pain, joint swelling, myalgias, morning stiffness, muscle tenderness and myalgias.  Skin:  Positive for color change. Negative for rash, redness and sensitivity to sunlight.  Allergic/Immunologic: Negative for susceptible to infections.  Neurological:  Positive for numbness. Negative for dizziness, headaches, memory loss and weakness.  Hematological:  Positive for bruising/bleeding tendency. Negative for swollen glands.  Psychiatric/Behavioral:  Negative for depressed mood and sleep disturbance. The patient is not nervous/anxious.    PMFS History:  Patient Active Problem List   Diagnosis Date Noted   Bee sting allergy 12/13/2020   Pruritus 25/95/6387   Acute systolic CHF (congestive heart failure) ([place]) 10/25/2020   STEMI (ST elevation myocardial infarction) ([place]) 10/24/2020   Precordial chest pain 10/24/2020   Takotsubo cardiomyopathy    Closed fracture of phalanx of foot 08/17/2020   Symptomatic stenosis of right carotid artery 08/08/2020   TIA (transient ischemic attack) 08/06/2020   Anemia 08/06/2020   Encounter for counseling 02/08/2020   Suppression of immune system - subtherapeutic ([place]) 07/14/2019   History of TIA (transient ischemic attack) 05/10/2019   Non-ST elevated myocardial infarction ([place]) 02/25/2019   NSTEMI (non-ST elevated myocardial infarction) ([place]) 02/25/2019   Elevated troponin level not due to acute coronary syndrome    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   On statin therapy 04/27/2018  Memory loss or impairment 16/02/9603   Complicated migraine 54/01/8118   Hyperlipidemia LDL goal <70 03/12/2018   History of RPR test, 9/18 felt to be false positive 03/12/2018   Stroke-like episode (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   Tobacco abuse 01/22/2017   Tobacco abuse counseling 01/22/2017   Neoplastic malignant related fatigue 01/22/2017   Vitamin D insufficiency 01/22/2017   Estrogen deficiency 01/14/2017   H/O cardiac catheterization- in prep for lung ca sx 01/06/2017   Hypothyroidism 01/06/2017   Previous back surgeries 01/06/2017   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   Chronic diastolic congestive heart failure (HCC)    Chronic obstructive pulmonary disease (HCC)    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   Antineoplastic chemotherapy induced anemia 05/05/2015   History of epistaxis 03/30/2015   Raynaud's disease 01/19/2015   Malignant neoplasm of left lung ([place]) 12/30/2014   Lung cancer ([place]) 12/30/2014   Malignant neoplasm of overlapping sites of left lung ([place]) 12/06/2014   Nonintractable episodic headache 12/06/2014   Severe mitral regurgitation 11/15/2014   Scleroderma ([place]) 10/03/2014   Asthmatic bronchitis , chronic ([place]) 10/03/2014   Multiple pulmonary nodules 10/03/2014   Current Cigarette smoker 10/03/2014    Past Medical History:  Diagnosis Date   Allergy    Asthma    pt denies this, but is on Dulera   Carotid artery disease ([place]) 2019   1-39% stenosis by Dopplers    Chronic diastolic congestive heart failure (HCC)    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/24/7827   Complication of anesthesia    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 [facility] (had to have botox injection)   COPD (chronic obstructive pulmonary disease) ([place])    Family  history of adverse reaction to anesthesia    Mother- very sensitive to medication   Heart murmur    MVP   Hypothyroidism    Lung cancer ([place]) 12/30/2014   Synchronous primaries:  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, each treated by wedge resection with post-op adjuvant [facility] at [facility]   MVP (mitral valve prolapse) 2018   s/p Minimally-Invasive Mitral Valve Replacement w/  [facility] Mitral bovine bioprosthetic tissue valve (size 9m, model # 7300TFX, serial # [id number]   PONV (postoperative nausea and vomiting)    Raynaud's syndrome    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   [facility] via right mini thoracotomy approach   Severe mitral regurgitation 11/15/2014   Shortness of breath dyspnea    with exertion   STD (sexually transmitted disease)    STEMI (ST elevation myocardial infarction) ([place] 10/24/2020   Telangiectasia     Family History  Problem Relation Age of Onset   Mitral valve prolapse Mother    Dementia Father    Prostate cancer Father    Mitral valve prolapse Sister    Mitral valve prolapse Brother    Healthy Son  Healthy Daughter    Colon cancer Neg Hx    Colon polyps Neg Hx    Rectal cancer Neg Hx    Esophageal cancer Neg Hx    Stomach cancer Neg Hx    Heart attack Neg Hx    Past Surgical History:  Procedure Laterality Date   BACK SURGERY     x 3  Disectomy   BREAST BIOPSY Left    CARDIAC CATHETERIZATION N/A 03/08/2016   Procedure: Right/Left Heart Cath and Coronary Angiography;  Surgeon: Sherren Mocha, MD;  Location: [facility];  Service: Cardiovascular;  Laterality: N/A;   CLAVICLE SURGERY Left 2013   plate to left collar bone   ENDARTERECTOMY Right 08/09/2020   Procedure: RIGHT CAROTID ENDARTERECTOMY;  Surgeon: Waynetta Sandy, MD;  Location: [facility];  Service: Vascular;  Laterality: Right;   LAPAROSCOPY     ? reason-age  39    LEFT HEART CATH AND CORONARY ANGIOGRAPHY N/A 02/25/2019   Procedure: LEFT HEART CATH AND CORONARY ANGIOGRAPHY;  Surgeon: Jettie Booze, MD;  Location: [place] [facility];  Service: Cardiovascular;  Laterality: N/A;   LEFT HEART CATH AND CORONARY ANGIOGRAPHY N/A 10/24/2020   Procedure: LEFT HEART CATH AND CORONARY ANGIOGRAPHY;  Surgeon: Jettie Booze, MD;  Location: [facility];  Service: Cardiovascular;  Laterality: N/A;   LUNG CANCER SURGERY     MITRAL VALVE REPAIR Right 06/27/2016   Procedure: MINIMALLY INVASIVE MITRAL VALVE REPLACEMENT;  Surgeon: Rexene Alberts, MD;  Location: [facility];  Service: Open Heart Surgery;  Laterality: Right;   TEE WITHOUT CARDIOVERSION N/A 02/22/2016   Procedure: TRANSESOPHAGEAL ECHOCARDIOGRAM (TEE);  Surgeon: Dorothy Spark, MD;  Location: [facility];  Service: Cardiovascular;  Laterality: N/A;   TEE WITHOUT CARDIOVERSION N/A 06/27/2016   Procedure: TRANSESOPHAGEAL ECHOCARDIOGRAM (TEE);  Surgeon: Rexene Alberts, MD;  Location: [facility];  Service: Open Heart Surgery;  Laterality: N/A;   TONSILLECTOMY     VIDEO ASSISTED THORACOSCOPY (VATS)/WEDGE RESECTION Left 12/30/2014   Bronchoscopy, Mediastinoscopy, Left VATS for Wedge resection LUL x2 adn LLL x1 - Dr. Elenor Quinones at [place] Left 2017   injected with botox   wrist surgery Left 2015   plate to wrist    Social History   Social History Narrative   Not on file   Immunization History  Administered Date(s) Administered   Fluad Quad(high Dose 65+) 02/18/2019   H1N1 04/13/2008   Influenza Split 02/18/2015   Influenza, High Dose Seasonal PF 02/20/2017, 04/27/2018   Influenza,inj,Quad PF,6+ Mos 03/16/2015   Influenza-Unspecified 04/12/2008, 02/18/2011   Moderna Sars-Covid-2 Vaccination 06/21/2019, 07/18/2019, 01/25/2020   Pneumococcal Conjugate-13 01/22/2017   Tdap 09/26/2015   Zoster Recombinat (Shingrix) 05/10/2018, 03/03/2019     Objective: Vital Signs: BP 106/65  (BP Location: Right Arm, Patient Position: Sitting, Cuff Size: Normal)   Pulse 69   Ht _0  (1.676 m)   Wt 141 lb (64 kg)   BMI 22.76 kg/m    Physical Exam Vitals and nursing note reviewed.  Constitutional:      Appearance: She is well-developed.  HENT:     Head: Normocephalic and atraumatic.  Eyes:     Conjunctiva/sclera: Conjunctivae normal.  Cardiovascular:     Rate and Rhythm: Normal rate and regular rhythm.     Heart sounds: Normal heart sounds.  Pulmonary:     Effort: Pulmonary effort is normal.     Breath sounds: Normal breath sounds.  Abdominal:  General: Bowel sounds are normal.     Palpations: Abdomen is soft.  Musculoskeletal:     Cervical back: Normal range of motion.  Lymphadenopathy:     Cervical: No cervical adenopathy.  Skin:    General: Skin is warm and dry.     Capillary Refill: Capillary refill takes less than 2 seconds.     Comments: No sclerodactyly or nailbed capillary changes were noted.  Telangiectasias were noted on the face neck and her hands.  No digital ulcers were noted.  Neurological:     Mental Status: She is alert and oriented to person, place, and time.  Psychiatric:        Behavior: Behavior normal.     Musculoskeletal Exam: C-spine was in good range of motion.  Shoulder joints, elbow joints, wrist joints, MCPs PIPs and DIPs with good range of motion with no synovitis.  Hip joints, knee joints, ankles, MTPs and PIPs with good range of motion with no synovitis.  CDAI Exam: CDAI Score: -- Patient Global: --; Provider Global: -- Swollen: --; Tender: -- Joint Exam 01/25/2021   No joint exam has been documented for this visit   There is currently no information documented on the homunculus. Go to the Rheumatology activity and complete the homunculus joint exam.  Investigation: No additional findings.  Imaging: No results found.  Recent Labs: Lab Results  Component Value Date   WBC 6.7 11/30/2020   HGB 13.5 11/30/2020   PLT  211 11/30/2020   NA 136 11/30/2020   K 4.3 11/30/2020   CL 97 11/30/2020   CO2 24 11/30/2020   GLUCOSE 85 11/30/2020   BUN 10 11/30/2020   CREATININE 0.74 11/30/2020   BILITOT 0.3 11/30/2020   ALKPHOS 74 11/30/2020   AST 23 11/30/2020   ALT 12 11/30/2020   PROT 7.1 11/30/2020   ALBUMIN 4.6 11/30/2020   CALCIUM 9.9 11/30/2020   GFRAA 99 06/15/2020   QFTBGOLDPLUS NEGATIVE 10/12/2020    Speciality Comments: No specialty comments available.  Procedures:  No procedures performed Allergies: Penicillins   Assessment / Plan:     Visit Diagnoses: ILD (interstitial lung disease) ([place]) - High resolution chest CT 10/18/20: Mild pulmonary fibrosis. Fibrotic findings are worsened over time on prior exam 11/16/14.  Patient was evaluated by Dr. Chase Caller recently.  Chronic cough-due to chronic bronchitis.  Positive ANA (antinuclear antibody) - 10/12/20: ANA 1: 1280 nucleolar, nuclear, speckled , dsDNA<1, anti-CCP<16, RF<14, Scl-70-, ESR 22, aldolase 5.9, CK 129, Ro-, La-, myeloperoxidasae ab-, serine protease 3-.  She has positive ANA nucleolar pattern, history of Raynaud's, telangiectasias.  No nailbed capillary changes or sclerodactyly was noted.  There is no history of esophageal dysmotility or inflammatory arthritis.  At this point she does not meet the criteria for scleroderma.  She also had recent echocardiogram which did not show any pulmonary hypertension.  I will obtain [facility] labs.  We will discuss results at the follow-up visit.  Raynaud's disease without gangrene-she gives history of Raynauds for many years.  She is also a chronic smoker.  She had good capillary refill.  Malignant neoplasm of overlapping sites of left lung (HCC)-diagnosed in 2016 and treated at [facility].  Multiple pulmonary nodules  Asthmatic bronchitis , chronic ([place])-  Tobacco abuse-she is chronic smoker.  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  No orders of the defined types were placed in this encounter.  Total time spent in evaluating the patient and reviewing previous records was 50 minutes.  Follow-Up Instructions: Return for ILD, positive ANA.   Bo Merino, MD  Note - This record has been created using Editor, commissioning.  Chart creation errors have been sought, but may not always  have been located. Such creation errors do not reflect on  the standard of medical care.

## 2021-01-25 ENCOUNTER — Other Ambulatory Visit: Payer: Self-pay

## 2021-01-25 ENCOUNTER — Encounter: Payer: Self-pay | Admitting: Rheumatology

## 2021-01-25 ENCOUNTER — Ambulatory Visit (INDEPENDENT_AMBULATORY_CARE_PROVIDER_SITE_OTHER): Payer: Medicare Other | Admitting: Rheumatology

## 2021-01-25 VITALS — BP 106/65 | HR 69 | Ht 66.0 in | Wt 141.0 lb

## 2021-01-25 DIAGNOSIS — R053 Chronic cough: Secondary | ICD-10-CM | POA: Diagnosis not present

## 2021-01-25 DIAGNOSIS — I5181 Takotsubo syndrome: Secondary | ICD-10-CM | POA: Diagnosis not present

## 2021-01-25 DIAGNOSIS — I214 Non-ST elevation (NSTEMI) myocardial infarction: Secondary | ICD-10-CM

## 2021-01-25 DIAGNOSIS — Z720 Tobacco use: Secondary | ICD-10-CM

## 2021-01-25 DIAGNOSIS — R918 Other nonspecific abnormal finding of lung field: Secondary | ICD-10-CM

## 2021-01-25 DIAGNOSIS — Z8639 Personal history of other endocrine, nutritional and metabolic disease: Secondary | ICD-10-CM

## 2021-01-25 DIAGNOSIS — C3482 Malignant neoplasm of overlapping sites of left bronchus and lung: Secondary | ICD-10-CM | POA: Diagnosis not present

## 2021-01-25 DIAGNOSIS — J449 Chronic obstructive pulmonary disease, unspecified: Secondary | ICD-10-CM | POA: Diagnosis not present

## 2021-01-25 DIAGNOSIS — E785 Hyperlipidemia, unspecified: Secondary | ICD-10-CM

## 2021-01-25 DIAGNOSIS — R87610 Atypical squamous cells of undetermined significance on cytologic smear of cervix (ASC-US): Secondary | ICD-10-CM

## 2021-01-25 DIAGNOSIS — M349 Systemic sclerosis, unspecified: Secondary | ICD-10-CM

## 2021-01-25 DIAGNOSIS — R768 Other specified abnormal immunological findings in serum: Secondary | ICD-10-CM

## 2021-01-25 DIAGNOSIS — M810 Age-related osteoporosis without current pathological fracture: Secondary | ICD-10-CM

## 2021-01-25 DIAGNOSIS — I6521 Occlusion and stenosis of right carotid artery: Secondary | ICD-10-CM

## 2021-01-25 DIAGNOSIS — I6523 Occlusion and stenosis of bilateral carotid arteries: Secondary | ICD-10-CM

## 2021-01-25 DIAGNOSIS — E559 Vitamin D deficiency, unspecified: Secondary | ICD-10-CM | POA: Diagnosis not present

## 2021-01-25 DIAGNOSIS — D5912 Cold autoimmune hemolytic anemia: Secondary | ICD-10-CM

## 2021-01-25 DIAGNOSIS — I340 Nonrheumatic mitral (valve) insufficiency: Secondary | ICD-10-CM

## 2021-01-25 DIAGNOSIS — I7300 Raynaud's syndrome without gangrene: Secondary | ICD-10-CM

## 2021-01-25 DIAGNOSIS — J849 Interstitial pulmonary disease, unspecified: Secondary | ICD-10-CM

## 2021-01-25 DIAGNOSIS — B009 Herpesviral infection, unspecified: Secondary | ICD-10-CM

## 2021-01-25 DIAGNOSIS — I5032 Chronic diastolic (congestive) heart failure: Secondary | ICD-10-CM

## 2021-01-25 DIAGNOSIS — Z9289 Personal history of other medical treatment: Secondary | ICD-10-CM

## 2021-01-25 DIAGNOSIS — Z79899 Other long term (current) drug therapy: Secondary | ICD-10-CM

## 2021-01-25 DIAGNOSIS — G459 Transient cerebral ischemic attack, unspecified: Secondary | ICD-10-CM

## 2021-01-27 ENCOUNTER — Other Ambulatory Visit: Payer: Self-pay | Admitting: Nurse Practitioner

## 2021-01-27 DIAGNOSIS — Z91030 Bee allergy status: Secondary | ICD-10-CM

## 2021-01-27 DIAGNOSIS — L299 Pruritus, unspecified: Secondary | ICD-10-CM

## 2021-01-30 ENCOUNTER — Encounter: Payer: Self-pay | Admitting: Rheumatology

## 2021-01-30 DIAGNOSIS — J849 Interstitial pulmonary disease, unspecified: Secondary | ICD-10-CM | POA: Diagnosis not present

## 2021-01-30 DIAGNOSIS — I7300 Raynaud's syndrome without gangrene: Secondary | ICD-10-CM | POA: Diagnosis not present

## 2021-01-30 DIAGNOSIS — R768 Other specified abnormal immunological findings in serum: Secondary | ICD-10-CM | POA: Diagnosis not present

## 2021-01-30 DIAGNOSIS — D8989 Other specified disorders involving the immune mechanism, not elsewhere classified: Secondary | ICD-10-CM | POA: Diagnosis not present

## 2021-02-05 ENCOUNTER — Ambulatory Visit: Payer: Medicare Other | Admitting: Rheumatology

## 2021-02-11 ENCOUNTER — Other Ambulatory Visit: Payer: Self-pay | Admitting: Physician Assistant

## 2021-02-11 DIAGNOSIS — L299 Pruritus, unspecified: Secondary | ICD-10-CM

## 2021-02-11 DIAGNOSIS — Z91030 Bee allergy status: Secondary | ICD-10-CM

## 2021-02-12 ENCOUNTER — Telehealth (HOSPITAL_COMMUNITY): Payer: Self-pay | Admitting: *Deleted

## 2021-02-13 ENCOUNTER — Other Ambulatory Visit: Payer: Self-pay

## 2021-02-13 ENCOUNTER — Encounter (HOSPITAL_COMMUNITY)
Admission: RE | Admit: 2021-02-13 | Discharge: 2021-02-13 | Disposition: A | Discharge status: Home / Self Care | Payer: Medicare Other | Source: Ambulatory Visit | Attending: Cardiology | Admitting: Cardiology

## 2021-02-13 ENCOUNTER — Encounter (HOSPITAL_COMMUNITY): Payer: Self-pay

## 2021-02-13 VITALS — BP 126/82 | HR 88 | Ht 65.75 in | Wt 141.8 lb

## 2021-02-13 DIAGNOSIS — I5181 Takotsubo syndrome: Secondary | ICD-10-CM

## 2021-02-13 NOTE — Progress Notes (Signed)
Cardiac Rehab Medication Review by a Nurse  Does the patient  feel that his/her medications are working for him/her?  yes  Has the patient been experiencing any side effects to the medications prescribed?  no  Does the patient measure his/her own blood pressure or blood glucose at home?  yes   Does the patient have any problems obtaining medications due to transportation or finances?   no  Understanding of regimen: good Understanding of indications: good Potential of compliance: good    Nurse comments: Joy Williams is taking her medications as prescribed and has a good understanding of what her medications are for. Joy Williams has a BP monitor at home. Joy Williams says she checks her blood pressures intermittently.    Harrell Gave RN BSN 02/13/2021 8:48 AM

## 2021-02-13 NOTE — Progress Notes (Signed)
Cardiac Individual Treatment Plan  Patient Details  Name: Joy Williams MRN: [id number] Date of Birth: [birth year] Referring Provider:   Flowsheet Row CARDIAC REHAB PHASE II ORIENTATION from 02/13/2021 in [facility]  Referring Provider Gwyndolyn Kaufman, MD       Initial Encounter Date:  [place] PHASE II ORIENTATION from 02/13/2021 in [facility]  Date 02/13/21       Visit Diagnosis: Takotsubo cardiomyopathy  Patient's Home Medications on Admission:  Current Outpatient Medications:    acetaminophen (TYLENOL) 500 MG tablet, Take 1,000 mg by mouth every 6 (six) hours as needed for mild pain or headache., Disp: , Rfl:    Cholecalciferol (VITAMIN D) 125 MCG (5000 UT) CAPS, Take 5,000 Units by mouth daily., Disp: , Rfl:    clopidogrel (PLAVIX) 75 MG tablet, Take 1 tablet (75 mg total) by mouth daily., Disp: 90 tablet, Rfl: 3   EPINEPHrine (EPIPEN 2-PAK) 0.3 mg/0.3 mL IJ SOAJ injection, Inject 0.3 mg into the muscle as needed for anaphylaxis., Disp: 1 each, Rfl: 1   ferrous sulfate 325 (65 FE) MG tablet, Take 1 tablet (325 mg total) by mouth daily., Disp: 30 tablet, Rfl: 6   Fluticasone-Umeclidin-Vilant (TRELEGY ELLIPTA) 100-62.5-25 MCG/INH AEPB, TAKE 1 PUFF BY MOUTH EVERY DAY, Disp: 60 each, Rfl: 11   hydrOXYzine (ATARAX/VISTARIL) 25 MG tablet, TAKE 1/2 TO 1 TABLET BY MOUTH TWICE DAILY AS NEEDED FOR ITCHING, Disp: 45 tablet, Rfl: 1   levothyroxine (SYNTHROID) 175 MCG tablet, TAKE 1 TABLET BY MOUTH EVERY DAY BEFORE BREAKFAST, Disp: 90 tablet, Rfl: 1   MELATONIN PO, Take 1 tablet by mouth at bedtime as needed (sleep)., Disp: , Rfl:    Multiple Vitamins-Minerals (MULTIVITAMIN WITH MINERALS) tablet, Take 1 tablet by mouth daily., Disp: , Rfl:    nicotine (NICODERM CQ - DOSED IN MG/24 HR) 7 mg/24hr patch, Place 1 patch (7 mg total) onto the skin daily. (Patient taking differently: Place 7 mg onto the skin daily  as needed (nicotine cravings).), Disp: 28 patch, Rfl: 0   rosuvastatin (CRESTOR) 20 MG tablet, Take 1 tablet (20 mg total) by mouth daily. (Patient taking differently: Take 20 mg by mouth every evening.), Disp: 90 tablet, Rfl: 0   Turmeric 500 MG CAPS, Take 500 mg by mouth daily., Disp: , Rfl:    vitamin B-12 (CYANOCOBALAMIN) 1000 MCG tablet, Take 1 tablet (1,000 mcg total) by mouth daily., Disp: , Rfl:    predniSONE (STERAPRED UNI-PAK 21 TAB) 10 MG (21) TBPK tablet, 6 day taper - take by mouth as directed for 6 days (Patient not taking: No sig reported), Disp: 21 tablet, Rfl: 0  Past Medical History: Past Medical History:  Diagnosis Date   Allergy    Asthma    pt denies this, but is on Dulera   Carotid artery disease ([place]) 2019   1-39% stenosis by Dopplers    Chronic diastolic congestive heart failure (HCC)    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   Complication of anesthesia    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 [facility] (had to have botox injection)   COPD (chronic obstructive pulmonary disease) ([place])    Family history of adverse reaction to anesthesia    Mother- very sensitive to medication   Heart murmur    MVP   Hypothyroidism    Lung cancer ([place]) 12/30/2014   Synchronous primaries:  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, each treated by wedge  resection with post-op adjuvant chemoRx at [facility]   MVP (mitral valve prolapse) 2018   s/p Minimally-Invasive Mitral Valve Replacement w/  [facility] Mitral bovine bioprosthetic tissue valve (size 67mm, model # [id number], serial # [id number])   PONV (postoperative nausea and vomiting)    Raynaud's syndrome    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   31 mm [facility] mitral bovine bioprosthetic tissue valve placed via right mini thoracotomy approach   Severe mitral regurgitation 11/15/2014   Shortness of breath dyspnea    with exertion   STD (sexually transmitted disease)    STEMI (ST  elevation myocardial infarction) ([place]) 10/24/2020   Telangiectasia     Tobacco Use: Social History   Tobacco Use  Smoking Status Former   Packs/day: 0.25   Years: 24.00   Pack years: 6.00   Types: E-cigarettes, Cigarettes   Start date: 05/21/1983  Smokeless Tobacco Never  Tobacco Comments   [name] reports smoking 2-3 cigarettes a day and is currently using a nicotine patch. Patient given the phone number to 1-800-quit-now.     Labs: Recent Review Flowsheet Data     Labs for ITP Cardiac and Pulmonary Rehab Latest Ref Rng & Units 11/11/2018 02/25/2019 06/15/2020 08/07/2020 10/24/2020   Cholestrol 0 - 200 mg/dL 150 133 - 116 135   LDLCALC 0 - 99 mg/dL 64 57 - 50 55   LDLDIRECT 0 - 99 mg/dL 68 - 63 - -   HDL >40 mg/dL 70 61 - 46 60   Trlycerides <150 mg/dL 78 74 - 102 100   Hemoglobin A1c 4.8 - 5.6 % - - - 5.6 5.5   PHART 7.350 - 7.450 - - - - -   PCO2ART 32.0 - 48.0 mmHg - - - - -   HCO3 20.0 - 28.0 mmol/L - - - - -   TCO2 22 - 32 mmol/L - - - - 24   ACIDBASEDEF 0.0 - 2.0 mmol/L - - - - -   O2SAT % - - - - -       Capillary Blood Glucose: Lab Results  Component Value Date   GLUCAP 94 11/03/2020   GLUCAP 85 11/03/2020   GLUCAP 81 03/11/2018   GLUCAP 86 07/03/2016   GLUCAP 108 (H) 07/02/2016     Exercise Target Goals: Exercise Program Goal: Individual exercise prescription set using results from initial 6 min walk test and THRR while considering  patient's activity barriers and safety.   Exercise Prescription Goal: Starting with aerobic activity 30 plus minutes a day, 3 days per week for initial exercise prescription. Provide home exercise prescription and guidelines that participant acknowledges understanding prior to discharge.  Activity Barriers & Risk Stratification:  Activity Barriers & Cardiac Risk Stratification - 02/13/21 1028       Activity Barriers & Cardiac Risk Stratification   Activity Barriers Arthritis;Back Problems;Shortness of  Breath;Deconditioning;Decreased Ventricular Function;Balance Concerns;Other (comment)    Comments 3 back surgeries ( herniated disc L4-L5 and L3-L4),    Cardiac Risk Stratification High             6 Minute Walk:  6 Minute Walk     Row Name 02/13/21 0853         6 Minute Walk   Phase Initial     Distance 1450 feet     Walk Time 6 minutes     # of Rest Breaks 0     MPH 2.75     METS 3.35  RPE 11     Perceived Dyspnea  0     VO2 Peak 11.72     Symptoms Yes (comment)     Comments Bilateral Hip pain 2/10     Resting HR 88 bpm     Resting BP 126/82     Resting Oxygen Saturation  98 %     Exercise Oxygen Saturation  during 6 min walk 98 %     Max Ex. HR 94 bpm     Max Ex. BP 134/65     2 Minute Post BP 126/76              Oxygen Initial Assessment:   Oxygen Re-Evaluation:   Oxygen Discharge (Final Oxygen Re-Evaluation):   Initial Exercise Prescription:  Initial Exercise Prescription - 02/13/21 1100       Date of Initial Exercise RX and Referring Provider   Date 02/13/21    Referring Provider Gwyndolyn Kaufman, MD    Expected Discharge Date 02/09/21      NuStep   Level 2    SPM 85    Minutes 15    METs 2.2      Track   Laps 13    Minutes 15    METs 2.51      Prescription Details   Frequency (times per week) 3    Duration Progress to 30 minutes of continuous aerobic without signs/symptoms of physical distress      Intensity   THRR 40-80% of Max Heartrate 60-121    Ratings of Perceived Exertion 11-13    Perceived Dyspnea 0-4      Progression   Progression Continue progressive overload as per policy without signs/symptoms or physical distress.      Resistance Training   Training Prescription Yes    Weight 2 lbs    Reps 10-15             Perform Capillary Blood Glucose checks as needed.  Exercise Prescription Changes:   Exercise Comments:   Exercise Goals and Review:   Exercise Goals     Row Name 02/13/21 1031              Exercise Goals   Increase Physical Activity Yes       Intervention Provide advice, education, support and counseling about physical activity/exercise needs.;Develop an individualized exercise prescription for aerobic and resistive training based on initial evaluation findings, risk stratification, comorbidities and participant's personal goals.       Expected Outcomes Short Term: Attend rehab on a regular basis to increase amount of physical activity.;Long Term: Add in home exercise to make exercise part of routine and to increase amount of physical activity.;Long Term: Exercising regularly at least 3-5 days a week.       Increase Strength and Stamina Yes       Intervention Provide advice, education, support and counseling about physical activity/exercise needs.;Develop an individualized exercise prescription for aerobic and resistive training based on initial evaluation findings, risk stratification, comorbidities and participant's personal goals.       Expected Outcomes Short Term: Perform resistance training exercises routinely during rehab and add in resistance training at home;Short Term: Increase workloads from initial exercise prescription for resistance, speed, and METs.;Long Term: Improve cardiorespiratory fitness, muscular endurance and strength as measured by increased METs and functional capacity (6MWT)       Able to understand and use rate of perceived exertion (RPE) scale Yes       Intervention Provide education and explanation  on how to use RPE scale       Expected Outcomes Short Term: Able to use RPE daily in rehab to express subjective intensity level;Long Term:  Able to use RPE to guide intensity level when exercising independently       Knowledge and understanding of Target Heart Rate Range (THRR) Yes       Intervention Provide education and explanation of THRR including how the numbers were predicted and where they are located for reference       Expected Outcomes Short Term:  Able to state/look up THRR;Long Term: Able to use THRR to govern intensity when exercising independently;Short Term: Able to use daily as guideline for intensity in rehab       Understanding of Exercise Prescription Yes       Intervention Provide education, explanation, and written materials on patient's individual exercise prescription       Expected Outcomes Short Term: Able to explain program exercise prescription;Long Term: Able to explain home exercise prescription to exercise independently                Exercise Goals Re-Evaluation :    Discharge Exercise Prescription (Final Exercise Prescription Changes):   Nutrition:  Target Goals: Understanding of nutrition guidelines, daily intake of sodium 1500mg , cholesterol 200mg , calories 30% from fat and 7% or less from saturated fats, daily to have 5 or more servings of fruits and vegetables.  Biometrics:  Pre Biometrics - 02/13/21 0957       Pre Biometrics   Waist Circumference 37 inches    Hip Circumference 39 inches    Waist to Hip Ratio 0.95 %    Triceps Skinfold 23 mm    % Body Fat 36.8 %    Grip Strength 24 kg    Flexibility 12.25 in    Single Leg Stand 5.56 seconds              Nutrition Therapy Plan and Nutrition Goals:   Nutrition Assessments:  MEDIFICTS Score Key: ?70 Need to make dietary changes  40-70 Heart Healthy Diet ? 40 Therapeutic Level Cholesterol Diet   Picture Your Plate Scores: <99 Unhealthy dietary pattern with much room for improvement. 41-50 Dietary pattern unlikely to meet recommendations for good health and room for improvement. 51-60 More healthful dietary pattern, with some room for improvement.  >60 Healthy dietary pattern, although there may be some specific behaviors that could be improved.    Nutrition Goals Re-Evaluation:   Nutrition Goals Discharge (Final Nutrition Goals Re-Evaluation):   Psychosocial: Target Goals: Acknowledge presence or absence of significant  depression and/or stress, maximize coping skills, provide positive support system. Participant is able to verbalize types and ability to use techniques and skills needed for reducing stress and depression.  Initial Review & Psychosocial Screening:  Initial Psych Review & Screening - 02/13/21 0846       Initial Review   Current issues with None Identified      Family Dynamics   Good Support System? Yes   [name] lives alone. [name] has her children and grandchildren who live in the area for support     Barriers   Psychosocial barriers to participate in program There are no identifiable barriers or psychosocial needs.      Screening Interventions   Interventions Encouraged to exercise             Quality of Life Scores:  Quality of Life - 02/13/21 1002       Quality of Life  Select Quality of Life      Quality of Life Scores   Health/Function Pre 20.97 %    Socioeconomic Pre 22.8 %    Psych/Spiritual Pre 24.86 %    Family Pre 24.9 %    GLOBAL Pre 22.56 %            Scores of 19 and below usually indicate a poorer quality of life in these areas.  A difference of  2-3 points is a clinically meaningful difference.  A difference of 2-3 points in the total score of the Quality of Life Index has been associated with significant improvement in overall quality of life, self-image, physical symptoms, and general health in studies assessing change in quality of life.  PHQ-9: Recent Review Flowsheet Data     Depression screen [facility] 2/9 02/13/2021 12/19/2020 11/30/2020 08/17/2020 06/15/2020   Decreased Interest 0 0 0 0 0   Down, Depressed, Hopeless 0 0 0 0 0   PHQ - 2 Score 0 0 0 0 0   Altered sleeping - 1 0 0 0   Tired, decreased energy - 0 0 0 0   Change in appetite - 0 0 0 0   Feeling bad or failure about yourself  - 0 0 0 0   Trouble concentrating - 0 0 0 0   Moving slowly or fidgety/restless - 0 0 0 0   Suicidal thoughts - 0 0 0 0   PHQ-9 Score - 1 0 0 0   Difficult doing  work/chores - Somewhat difficult - - -      Interpretation of Total Score  Total Score Depression Severity:  1-4 = Minimal depression, 5-9 = Mild depression, 10-14 = Moderate depression, 15-19 = Moderately severe depression, 20-27 = Severe depression   Psychosocial Evaluation and Intervention:   Psychosocial Re-Evaluation:   Psychosocial Discharge (Final Psychosocial Re-Evaluation):   Vocational Rehabilitation: Provide vocational rehab assistance to qualifying candidates.   Vocational Rehab Evaluation & Intervention:  Vocational Rehab - 02/13/21 0847       Initial Vocational Rehab Evaluation & Intervention   Assessment shows need for Vocational Rehabilitation No   [name] is retired and does not not need vocational rehab at this time.            Education: Education Goals: Education classes will be provided on a weekly basis, covering required topics. Participant will state understanding/return demonstration of topics presented.  Learning Barriers/Preferences:  Learning Barriers/Preferences - 02/13/21 1003       Learning Barriers/Preferences   Learning Barriers Hearing;Sight   Pt wears glasses: bilateral hearing aids   Learning Preferences Audio;Computer/Internet;Group Instruction;Individual Instruction;Pictoral;Skilled Demonstration;Verbal Instruction;Video;Written Material             Education Topics: Hypertension, Hypertension Reduction -Define heart disease and high blood pressure. Discus how high blood pressure affects the body and ways to reduce high blood pressure.   Exercise and Your Heart -Discuss why it is important to exercise, the FITT principles of exercise, normal and abnormal responses to exercise, and how to exercise safely.   Angina -Discuss definition of angina, causes of angina, treatment of angina, and how to decrease risk of having angina.   Cardiac Medications -Review what the following cardiac medications are used for, how they  affect the body, and side effects that may occur when taking the medications.  Medications include Aspirin, Beta blockers, calcium channel blockers, ACE Inhibitors, angiotensin receptor blockers, diuretics, digoxin, and antihyperlipidemics.   Congestive Heart Failure -Discuss the definition of  CHF, how to live with CHF, the signs and symptoms of CHF, and how keep track of weight and sodium intake.   Heart Disease and Intimacy -Discus the effect sexual activity has on the heart, how changes occur during intimacy as we age, and safety during sexual activity.   Smoking Cessation / COPD -Discuss different methods to quit smoking, the health benefits of quitting smoking, and the definition of COPD.   Nutrition I: Fats -Discuss the types of cholesterol, what cholesterol does to the heart, and how cholesterol levels can be controlled.   Nutrition II: Labels -Discuss the different components of food labels and how to read food label   Heart Parts/Heart Disease and PAD -Discuss the anatomy of the heart, the pathway of blood circulation through the heart, and these are affected by heart disease.   Stress I: Signs and Symptoms -Discuss the causes of stress, how stress may lead to anxiety and depression, and ways to limit stress.   Stress II: Relaxation -Discuss different types of relaxation techniques to limit stress.   Warning Signs of Stroke / TIA -Discuss definition of a stroke, what the signs and symptoms are of a stroke, and how to identify when someone is having stroke.   Knowledge Questionnaire Score:  Knowledge Questionnaire Score - 02/13/21 1003       Knowledge Questionnaire Score   Pre Score 22/28             Core Components/Risk Factors/Patient Goals at Admission:  Personal Goals and Risk Factors at Admission - 02/13/21 1004       Core Components/Risk Factors/Patient Goals on Admission    Weight Management Weight Maintenance    Tobacco Cessation Yes     Number of packs per day 2-3 cigarettes per day    Intervention Assist the participant in steps to quit. Provide individualized education and counseling about committing to Tobacco Cessation, relapse prevention, and pharmacological support that can be provided by physician.;Advice worker, assist with locating and accessing local/national Quit Smoking programs, and support quit date choice.    Expected Outcomes Short Term: Will demonstrate readiness to quit, by selecting a quit date.;Short Term: Will quit all tobacco product use, adhering to prevention of relapse plan.;Long Term: Complete abstinence from all tobacco products for at least 12 months from quit date.    Improve shortness of breath with ADL's Yes    Intervention Provide education, individualized exercise plan and daily activity instruction to help decrease symptoms of SOB with activities of daily living.    Expected Outcomes Short Term: Improve cardiorespiratory fitness to achieve a reduction of symptoms when performing ADLs    Heart Failure Yes    Intervention Provide a combined exercise and nutrition program that is supplemented with education, support and counseling about heart failure. Directed toward relieving symptoms such as shortness of breath, decreased exercise tolerance, and extremity edema.    Expected Outcomes Improve functional capacity of life;Short term: Attendance in program 2-3 days a week with increased exercise capacity. Reported lower sodium intake. Reported increased fruit and vegetable intake. Reports medication compliance.;Short term: Daily weights obtained and reported for increase. Utilizing diuretic protocols set by physician.;Long term: Adoption of self-care skills and reduction of barriers for early signs and symptoms recognition and intervention leading to self-care maintenance.    Hypertension Yes    Intervention Provide education on lifestyle modifcations including regular physical  activity/exercise, weight management, moderate sodium restriction and increased consumption of fresh fruit, vegetables, and low fat dairy, alcohol moderation,  and smoking cessation.;Monitor prescription use compliance.    Expected Outcomes Short Term: Continued assessment and intervention until BP is < 140/10mm HG in hypertensive participants. < 130/70mm HG in hypertensive participants with diabetes, heart failure or chronic kidney disease.;Long Term: Maintenance of blood pressure at goal levels.    Lipids Yes    Intervention Provide education and support for participant on nutrition & aerobic/resistive exercise along with prescribed medications to achieve LDL 70mg , HDL >40mg .    Expected Outcomes Short Term: Participant states understanding of desired cholesterol values and is compliant with medications prescribed. Participant is following exercise prescription and nutrition guidelines.;Long Term: Cholesterol controlled with medications as prescribed, with individualized exercise RX and with personalized nutrition plan. Value goals: LDL < 70mg , HDL > 40 mg.             Core Components/Risk Factors/Patient Goals Review:    Core Components/Risk Factors/Patient Goals at Discharge (Final Review):    ITP Comments:  ITP Comments     Row Name 02/13/21 0845           ITP Comments Dr Fransico Him MD, Medical Director                Comments: [name]  attended orientation on 02/13/2021 to review rules and guidelines for program.  Completed 6 minute walk test, Intitial ITP, and exercise prescription.  VSS. Telemetry-Sinus Rhythm. [name] reported having 2/10 chronic lower back pan and 2/10 bilateral hip pain that resolved after walk test completed. . Safety measures and social distancing in place per CDC guidelines.Barnet Pall, RN,BSN 02/13/2021 1:29 PM

## 2021-02-14 ENCOUNTER — Telehealth: Payer: Self-pay

## 2021-02-14 NOTE — Telephone Encounter (Signed)
Patient left a voicemail stating Dr. Estanislado Pandy ordered a special blood test and she hasn't received the results.  Patient requested to speak with Dr. Estanislado Pandy or her nurse.

## 2021-02-14 NOTE — Telephone Encounter (Signed)
Attempted to contact the patient and left message to advise patient we have received her AV[facility] lab results and they will be discussed at her follow up visit on 03/05/2021.

## 2021-02-15 ENCOUNTER — Ambulatory Visit (INDEPENDENT_AMBULATORY_CARE_PROVIDER_SITE_OTHER): Payer: Medicare Other | Admitting: Internal Medicine

## 2021-02-15 ENCOUNTER — Ambulatory Visit (INDEPENDENT_AMBULATORY_CARE_PROVIDER_SITE_OTHER): Payer: Medicare Other | Admitting: Family

## 2021-02-15 ENCOUNTER — Encounter: Payer: Self-pay | Admitting: Internal Medicine

## 2021-02-15 ENCOUNTER — Encounter (HOSPITAL_BASED_OUTPATIENT_CLINIC_OR_DEPARTMENT_OTHER): Payer: Self-pay | Admitting: Family

## 2021-02-15 ENCOUNTER — Telehealth: Payer: Self-pay | Admitting: *Deleted

## 2021-02-15 ENCOUNTER — Other Ambulatory Visit: Payer: Self-pay

## 2021-02-15 VITALS — BP 108/60 | HR 75 | Temp 97.6°F | Ht 66.0 in | Wt 142.0 lb

## 2021-02-15 VITALS — BP 126/82 | HR 84 | Ht 66.0 in | Wt 143.0 lb

## 2021-02-15 DIAGNOSIS — Z87891 Personal history of nicotine dependence: Secondary | ICD-10-CM

## 2021-02-15 DIAGNOSIS — K449 Diaphragmatic hernia without obstruction or gangrene: Secondary | ICD-10-CM

## 2021-02-15 DIAGNOSIS — I10 Essential (primary) hypertension: Secondary | ICD-10-CM | POA: Diagnosis not present

## 2021-02-15 DIAGNOSIS — Z85118 Personal history of other malignant neoplasm of bronchus and lung: Secondary | ICD-10-CM | POA: Diagnosis not present

## 2021-02-15 DIAGNOSIS — R0600 Dyspnea, unspecified: Secondary | ICD-10-CM | POA: Diagnosis not present

## 2021-02-15 DIAGNOSIS — Z8679 Personal history of other diseases of the circulatory system: Secondary | ICD-10-CM

## 2021-02-15 DIAGNOSIS — I428 Other cardiomyopathies: Secondary | ICD-10-CM | POA: Diagnosis not present

## 2021-02-15 DIAGNOSIS — J849 Interstitial pulmonary disease, unspecified: Secondary | ICD-10-CM

## 2021-02-15 DIAGNOSIS — E785 Hyperlipidemia, unspecified: Secondary | ICD-10-CM

## 2021-02-15 DIAGNOSIS — I6523 Occlusion and stenosis of bilateral carotid arteries: Secondary | ICD-10-CM

## 2021-02-15 DIAGNOSIS — I2510 Atherosclerotic heart disease of native coronary artery without angina pectoris: Secondary | ICD-10-CM | POA: Diagnosis not present

## 2021-02-15 DIAGNOSIS — R768 Other specified abnormal immunological findings in serum: Secondary | ICD-10-CM | POA: Diagnosis not present

## 2021-02-15 DIAGNOSIS — R0609 Other forms of dyspnea: Secondary | ICD-10-CM

## 2021-02-15 DIAGNOSIS — Z23 Encounter for immunization: Secondary | ICD-10-CM

## 2021-02-15 DIAGNOSIS — Z952 Presence of prosthetic heart valve: Secondary | ICD-10-CM

## 2021-02-15 DIAGNOSIS — R053 Chronic cough: Secondary | ICD-10-CM | POA: Diagnosis not present

## 2021-02-15 LAB — PULMONARY FUNCTION TEST
DL/VA % pred: 86 %
DL/VA: 3.5 ml/min/mmHg/L
DLCO cor % pred: 57 %
DLCO cor: 12.17 ml/min/mmHg
DLCO unc % pred: 57 %
DLCO unc: 12.17 ml/min/mmHg
FEF 25-75 Post: 2.11 L/sec
FEF 25-75 Pre: 1.66 L/sec
FEF2575-%Change-Post: 27 %
FEF2575-%Pred-Post: 103 %
FEF2575-%Pred-Pre: 81 %
FEV1-%Change-Post: 6 %
FEV1-%Pred-Post: 77 %
FEV1-%Pred-Pre: 73 %
FEV1-Post: 1.96 L
FEV1-Pre: 1.84 L
FEV1FVC-%Change-Post: 3 %
FEV1FVC-%Pred-Pre: 102 %
FEV6-%Change-Post: 2 %
FEV6-%Pred-Post: 76 %
FEV6-%Pred-Pre: 74 %
FEV6-Post: 2.41 L
FEV6-Pre: 2.35 L
FEV6FVC-%Pred-Post: 104 %
FEV6FVC-%Pred-Pre: 104 %
FVC-%Change-Post: 2 %
FVC-%Pred-Post: 72 %
FVC-%Pred-Pre: 71 %
FVC-Post: 2.41 L
FVC-Pre: 2.35 L
Post FEV1/FVC ratio: 81 %
Post FEV6/FVC ratio: 100 %
Pre FEV1/FVC ratio: 78 %
Pre FEV6/FVC Ratio: 100 %
RV % pred: 64 %
RV: 1.49 L
TLC % pred: 70 %
TLC: 3.83 L

## 2021-02-15 MED ORDER — SPIRIVA RESPIMAT 1.25 MCG/ACT IN AERS: 2.0000 | INHALATION_SPRAY | Freq: Every day | RESPIRATORY_TRACT | 0 refills | Status: DC

## 2021-02-15 NOTE — Progress Notes (Signed)
Full PFT performed today. °

## 2021-02-15 NOTE — Progress Notes (Signed)
Subjective:    Patient ID: Joy Williams, female    DOB: [birth year]    MRN: [id number]    Brief patient profile:  70  yowf  Quit smoking 02/2019   dx with CREST 2004 in [place] moved to [place] from [place] Nov 2015  self referred to pulmonary clinic 10/03/14  for copd though last set of pfts from 2014 did not show copd    History of Present Illness  10/03/2014 1st [facility] office visit/ [facility]  On spiriva chronically  Chief Complaint  Patient presents with   Pulmonary Consult    Self referral. Pt moved from [place] to [facility] in Nov 2015. 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  She c/o "smoker's cough"- proc with clear sputum.  Her breathing is overall doing well.    cough is the main problem - can still do 5k racing power walk s stopping ie Not limited by breathing from desired activities  Takes fish oil for heart regurgitation and it "works great"  Cough is daily , def Worse in am's year round, present x > 5 y, worse since in [place]  rec Stop spiriva and start >>> Dulera 100 Take 2 puffs first thing in am and then another 2 puffs about 12 hours later.  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 Surgical resection with wedge resection and mediastinal lymph node sampling 12/2014 Records  suggest pneumonectomy done but clearly this is an error      06/07/2015  Extended f/u ov/Wert re: transition of care  sob s/p L lung resection maint rx dulera 100 2bid  Chief Complaint  Patient presents with   Follow-up    Pt c/o increased DOE since had lung surgery for lung ca in Sept 2016.   doe x  MMRC2 = can't walk a nl pace on a flat grade s sob/ assoc with day > noct cough/ no assoc dysphagia/ has not had any RT  rec For cough > mucinex dm 1200 mg every 12 hours as needed Continue dulera 100 Take 2 puffs first thing in am and then another 2 puffs about 12 hours later.  Work on inhaler technique:  The key is to stop smoking completely before smoking completely stops you!  Try prilosec otc 68m  Take 30-60 min before first meal of the day and Pepcid ac (famotidine) 20 mg one @  bedtime until cough is completely gone for at least a week without the need for cough suppression GERD diet    09/17/2016  f/u ov/Wert re: AB on dulera 100 2bid but insurance no longer covering  Chief Complaint  Patient presents with   Follow-up    Pt states her breathing is doing well. She is requesting refill on Dulera. She states she has  AM cough if she forgets to take the PM dose of Dulera. She does not have an albuterol inhaler.   working toward a 5 k this month s/p  MVR 06/27/16 rec  Work on inhaler technique:  Continue symbicort 80 Take 2 puffs first thing in am and then another 2 puffs about 12 hours  lateer    05/25/2018  f/u ov/Wert re: AB  Was on symb 80 2 bid doing ok  Chief Complaint  Patient presents with   Follow-up    Insurance stopped covering symbicort and so she has been out of med x 4 days. She states her breathing has been "okay".  She does not have a rescue inhaler.   Dyspnea:  Not limited by breathing from desired activities  As long as on symb Cough: not a problem Sleeping: fine bed flat  SABA use: none 02: none  rec Advair 115  Take 2 puffs first thing in am and then another 2  puffs about 12 hours later.  If you are not satisfied for any reason, please return your drug formulary to pick an alternative   07/09/2018  f/u ov/Wert re:  Copd 0/ AB  Chief Complaint  Patient presents with   Follow-up    discuss inhalers per 2/10 phone note.  advair is not working as well as the [employer].    Dyspnea:  More sob on advair vs symbicort Cough: some worse on advair/ dry hacking  Sleeping: up at least once cough SABA use: never uses  02: none  rec Stop advair and start trelegy one click each am x 2 good drags and use arm and hammer tooth paste and gargle and spit out If not happy with trelegy then call for PA on symbicort The key is to stop smoking completely before smoking completely stops you!     08/05/2019  f/u ov/Wert re: AB /  Quit smoking oct  2020/ planning to  Begin  pulmonary rehab/ last covid 2 weeks prior to [place] Complaint  Patient presents with   Follow-up    F/U after PFT. States her breathing has been stable since last visit. Will be starting pulmonary rehab soon.   Dyspnea:  trelegy has been the best rx yet now = MMRC1 = can walk nl pace, flat grade, can't hurry or go uphills or steps s sob   Cough: not now that quit smoking  Sleeping: better now s cough or sob  SABA use: none  02: none  rec No change rx/ f/u one year   09/12/2020  f/u ov/Wert re:  AB/ Scleroderma assoc UIP - did quit smoking / maint trelelgy  Chief Complaint  Patient presents with   Follow-up    Sob-better, had mini stroke last mth., pnd  Dyspnea:  MMRC1 = can walk nl pace, flat grade, can't hurry or go uphills or steps s sob   Cough: just with pollen Sleeping: no resp problems  SABA use: none  02: none  Covid status:   vax x 3 moderna    No obvious day to day or daytime variability or assoc excess/ purulent sputum or mucus plugs or hemoptysis or cp or chest tightness, subjective wheeze or overt sinus or hb symptoms.   Sleeping  without nocturnal  or early am  exacerbation  of respiratory  c/o's or need for noct saba. Also denies any obvious fluctuation of symptoms with weather or environmental changes or other aggravating or alleviating factors except as outlined above  No unusual exposure hx or h/o childhood pna/ asthma or knowledge of premature birth.        OV 10/12/2020  Subjective:  Patient ID: Joy Williams, female , DOB: Jul 15, 1950 , age 43 y.o. , MRN: [id number] , ADDRESS: [address] [place] [address] PCP Lorrene Reid, PA-C Patient Care Team: Lorrene Reid, PA-C as PCP - General Freada Bergeron, MD as PCP - Cardiology (Cardiology) Dorothy Spark, MD (Inactive) as Consulting Physician (Cardiology) Tanda Rockers, MD as Consulting Physician (Pulmonary Disease) Hurley Cisco, MD as Consulting Physician (Rheumatology) Lerry Paterson, MD as Referring Physician Lissa Morales, MD as Referring Physician (Internal Medicine) [name], MD as Consulting Physician (Infectious Diseases)  This Provider for this visit: Treatment Team:  Attending Provider: Brand Males, MD    10/12/2020 -   Chief Complaint  Patient presents with   Consult    Pt is being referred to Dr. Chase Caller by Dr. Melvyn Novas.  Pt states that she has an occ cough but denies any other complaints.   Transfer of care from Dr. Christinia Gully to Dr. Chase Caller in the ILD center.  History is gained from talking to the patient and review of the medical records including [place] and visualization of 2016 or 2017 CT scan of the chest.  HPI INGRI DIEMER 70 y.o. -used to live in [place].  She worked in the [employer] for [employer].  In 2016 she retired and came to [place].  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  Her primary care physician thought she might have crest syndrome because she had Raynaud's along with allergic cases and she had  edema in her fingers.  After she moved here apparently she saw a rheumatologist [review of the chart indicates is Dr. Charlestine Night who has since retired] and was told that she might not have scleroderma.  She has been on observation therapy from a rheumatological standpoint.  In fact never seen a rheumatologist.  Then in 2016 she had early stage non-small cell lung cancer for which she underwent wedge resection left upper lobe.  She is followed up with [facility] since then.  She has had annual CT scans of the chest.  Most recent 1 was in May 2021.  She believes and per record review that she is in complete remission and I believe she might be discharged from follow-up there.  She tells me that at baseline she has very mild shortness of breath that is stable as documented below.  She also has amount of cough that is also documented below.  Is not clear that this is necessarily worse.  She is maintained on inhaled corticosteroid and long-acting beta agonist.  She is not sure if this is because of her diagnose of COPD other than the fact that she is on it for respiratory symptoms.  Most recently when she followed up with Dr. Gustavus Bryant review of the records revealed that there was interstitial lung disease findings on the CT scan of the chest in May 2021 at [facility].  In my review of the notes it appears that the pattern might have changed.  Infection was present even in 2016 2017 and currently the pattern might be changed.  She is definitely not feeling it.  Her symptoms are minimal and her walking desaturation test is normal.  Because of the ILD changes she has been referred to the ILD center  CT Chest data 09/23/19 at [facility] (no image avail)  Interface, Rad Results In - 09/23/2019 11:22 AM EDT  Formatting of this note might be different from the original.  CT Chest   Indication:  Non-small cell lung cancer, monitor, C34.92 Malignant neoplasm  of unspecified part of left bronchus or lung (CMS-HCC).    Comparison Exams:  06/10/2019   Protocol:  Chest CT WO Protocol.  Contiguous 0.625 mm axial images with 5  mm axial, 3 mm coronal, and 3 mm sagittal reconstructions were obtained  from the neck base through the upper abdomen without IV contrast material.  In addition, 3-D maximal intensity projection (MIP) reconstructions were  performed to potentially increase the sensitivity for the detection of  pulmonary nodules.   Findings:   The central airways are patent without endobronchial mass. The patient is  status post left upper lobe wedge resection which is stable in appearance.  Status post left lower lobe wedge resection with a similar appearance of  peripheral consolidation and groundglass lung with traction bronchiectasis  adjacent to the staple line. Redemonstrated findings of lower lobe  predominant pulmonary fibrosis with subpleural reticulation and probable  UIP pattern. Stable sub-6 mm pulmonary nodules and nodular opacities are  similar, index 5 mm pulmonary nodule in the anterior pleura of the right  upper lobe (4-193). New left lower lobe nodule on image 4-300 is favored to  represent endoluminal debris. No effusion or pneumothorax. High density  focus along the right diaphragm may represent pleural calcification.  Scattered calcified and noncalcified mediastinal and hilar lymph nodes,  measuring up to 1.3 cm in the left lower paratracheal region   Status post mitral valve are clear with mild cardiomegaly and coronary  artery calcifications in the LAD. No pericardial effusion.   There is a small hiatal hernia no aggressive osseous lesions.   The thyroid gland is unremarkable. No axillary adenopathy or chest wall  abnormality.   Impression:   Stable versus slightly increased in subpleural consolidation in the left  lower lobe adjacent to the surgical staple line. Given curvilinear  appearance on sagittal imaging, findings are favored to represent confluent  fibrosis  over malignancy. Consider additional follow-up chest CT in 3-6  months.   Additional nodules/nodular opacities are similar. Attention on follow-up.   Electronically Reviewed by:  Cheryl Flash, MD, [facility]  Electronically Reviewed on:  09/23/2019 11:14 AM    No results found.   Dec 30 2014 removed LUL lung tissue with 2 tumors/ and L vc paralyzed ever since - last chemo 05/06/15   Last [facility] onc note:  Date of diagnosis: 12/2014 Diagnosis: NSCLC (Synchronous primaries) Histology: Adenocarcinoma Mutation status: EGFR pending, ALK, ROS1 negative Stage: Adenocarcinoma: T1aN0M0 Squamous T2aN0M0 Prior therapy: 1. Surgical resection with wedge resection and mediastinal lymph node s   OV 02/15/2021  Subjective:  Patient ID: Joy Williams, female , DOB: 03-Sep-1950 , age 37 y.o. , MRN: [id number] , ADDRESS: [address] [place] [address] PCP Lorrene Reid, PA-C Patient Care Team: Lorrene Reid, PA-C as PCP - General Freada Bergeron, MD as PCP - Cardiology (Cardiology) Tanda Rockers, MD as Consulting Physician (Pulmonary Disease) Hurley Cisco, MD as Consulting Physician (Rheumatology) Lerry Paterson, MD as Referring Physician Lissa Morales, MD as Referring Physician (Internal Medicine) [name], MD as Consulting Physician (Infectious Diseases) [name], Rounding, MD as Rounding Team (Cardiology)  This Provider for this visit: Treatment Team:  Attending Provider: Brand Males, MD    02/15/2021 -  Chief Complaint  Patient presents with   Follow-up    PFT performed today. Pt states she has been doing okay since last visit and states she is about the same.   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 -presents for follow-up.  Last seen in May 2022.  At the time initiated ILD work-up.  Her ANA is strongly positive.  She has history of Raynaud's.  So we sent her to rheumatology.  She saw Dr Estanislado Pandy only recently.  First note reviewed.  No specific connective tissue disease identified. AVISE was sent out and they texted me today saying the results were negative.  Patient continues to overall feel the same.  She had pulmonary function test that shows decline over time.  Approximately 6 %/year over the last 18 months.  This is when FVC and DLCO are averaged.  She is definitely not feeling the difference.  Of note she had a Takotsubo cardiomyopathy in June 2022.  By July 2022 her echo normalized.  She will consult cardiac rehabilitation soon.  She continues to smoke but has reduced.  She nondistended importance of quitting smoking.  She will have a high-dose flu shot today.  We discussed COVID booster by Valent.  SYMPTOM SCALE - ILD 10/12/2020   O2 use ra  Shortness of Breath 0 -> 5 scale with 5 being worst (score 6 If unable to do)  At rest 0  Simple tasks - showers, clothes change, eating, shaving 0  Household (dishes, doing bed, laundry) 1  Shopping 2  Walking level at own pace 1  Walking up Stairs 2  Total (30-36) Dyspnea Score 6  How bad is your cough? 2.5  How bad is your fatigue 2.5  How bad is nausea 0  How bad is vomiting?  0  How bad is diarrhea? 0  How bad is anxiety? 1  How bad is depression 0       Simple office walk 185 feet x  3 laps goal with forehead probe 10/12/2020   O2 used ra  Number laps completed 3  Comments about pace avg  Resting Pulse Ox/HR 100% and 72/min  Final Pulse Ox/HR 100% and 95/min  Desaturated </= 88% no  Desaturated <= 3% points no  Got Tachycardic >/= 90/min yes  Symptoms at end of test  No complaints  Miscellaneous comments x     PFT  PFT Results Latest Ref Rng & Units 02/15/2021 08/05/2019 03/12/2016 11/14/2014  FVC-Pre L 2.35 2.52 2.67 2.97  FVC-Predicted Pre % 71 74 76 84  FVC-Post L 2.41 2.52 2.61 3.00  FVC-Predicted Post % 72 74 75 85  Pre FEV1/FVC % % 78 81 78 78  Post FEV1/FCV % % 81 82 80 78  FEV1-Pre L 1.84 2.04 2.09 2.33  FEV1-Predicted Pre % 73 79 78 86  FEV1-Post L 1.96 2.06 2.09 2.33  DLCO uncorrected ml/min/mmHg 12.17 13.77 13.52 14.61  DLCO UNC% % 57 64 48 52  DLCO corrected ml/min/mmHg 12.17 13.77 14.73 -  DLCO COR %Predicted % 57 64 53 -  DLVA Predicted % 86 87 84 65  TLC L 3.83 4.22 4.18 4.88  TLC %  Predicted % 70 77 77 89  RV % Predicted % 64 74 71 88     IMPRESSION: 1. There is mild pulmonary fibrosis in a pattern with apical to basal gradient, featuring irregular peripheral interstitial opacity, septal thickening, traction bronchiectasis, and subpleural bronchiolectasis at the lung basis without clear evidence of honeycombing. Fibrotic findings are worsened over time on prior examinations dating back to 11/16/2014. Findings are categorized as probable UIP per consensus guidelines: Diagnosis of Idiopathic Pulmonary Fibrosis: An Official ATS/ERS/JRS/ALAT Clinical Practice Guideline. [place], Iss 5, [phone], Jan 18 2017. 2. Redemonstrated postoperative findings of anterior left upper lobe and dependent left lower lobe wedge resections. No evidence of malignant recurrence in the chest. 3. Coronary artery disease.   Aortic Atherosclerosis (ICD10-I70.0).     Electronically Signed   By: Eddie Candle M.D.   OnResults for SELEENA, REIMERS (MRN [id number]) as of 10/20/2020 16:40  Ref. Range 10/12/2020 16:37  Anti Nuclear Antibody (ANA) Latest Ref Range: NEGATIVE  POSITIVE (A)  ANA Pattern 1 Unknown Nuclear, Nucleolar (A)  ANA Titer 1 Latest Units: titer 9:0,240 (H)  Cyclic Citrullin Peptide Ab Latest Units: UNITS  <16  ds DNA Ab Latest Units: IU/mL <1  Myeloperoxidase Abs Latest Units: AI <1.0  Serine Protease 3 Latest Units: AI <1.0  RA Latex Turbid. 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   reports that she has been smoking e-cigarettes and cigarettes. She started smoking about 37 years ago. She has a 18.00 pack-year smoking history. She has never used smokeless tobacco.  Past Surgical History:  Procedure Laterality Date   BACK SURGERY     x 3  Disectomy   BREAST BIOPSY Left    CARDIAC CATHETERIZATION N/A 03/08/2016   Procedure: Right/Left Heart Cath and Coronary Angiography;  Surgeon: Sherren Mocha, MD;  Location: [facility];  Service: Cardiovascular;  Laterality: N/A;   CLAVICLE SURGERY Left 2013   plate to left collar bone   ENDARTERECTOMY Right 08/09/2020   Procedure: RIGHT CAROTID ENDARTERECTOMY;  Surgeon: Waynetta Sandy, MD;  Location: [facility];  Service: Vascular;  Laterality: Right;   LAPAROSCOPY     ? reason-age 65    LEFT HEART CATH AND  CORONARY ANGIOGRAPHY N/A 02/25/2019   Procedure: LEFT HEART CATH AND CORONARY ANGIOGRAPHY;  Surgeon: Jettie Booze, MD;  Location: [facility];  Service: Cardiovascular;  Laterality: N/A;   LEFT HEART CATH AND CORONARY ANGIOGRAPHY N/A 10/24/2020   Procedure: LEFT HEART CATH AND CORONARY ANGIOGRAPHY;  Surgeon: Jettie Booze, MD;  Location: [facility];  Service: Cardiovascular;  Laterality: N/A;   LUNG CANCER SURGERY     MITRAL VALVE  REPAIR Right 06/27/2016   Procedure: MINIMALLY INVASIVE MITRAL VALVE REPLACEMENT;  Surgeon: Rexene Alberts, MD;  Location: [facility];  Service: Open Heart Surgery;  Laterality: Right;   TEE WITHOUT CARDIOVERSION N/A 02/22/2016   Procedure: TRANSESOPHAGEAL ECHOCARDIOGRAM (TEE);  Surgeon: Dorothy Spark, MD;  Location: [facility];  Service: Cardiovascular;  Laterality: N/A;   TEE WITHOUT CARDIOVERSION N/A 06/27/2016   Procedure: TRANSESOPHAGEAL ECHOCARDIOGRAM (TEE);  Surgeon: Rexene Alberts, MD;  Location: [facility];  Service: Open Heart Surgery;  Laterality: N/A;   TONSILLECTOMY     VIDEO ASSISTED THORACOSCOPY (VATS)/WEDGE RESECTION Left 12/30/2014   Bronchoscopy, Mediastinoscopy, Left VATS for Wedge resection LUL x2 adn LLL x1 - Dr. Elenor Quinones at [facility] Left 2017   injected with botox   wrist surgery Left 2015   plate to wrist     Allergies  Allergen Reactions   Penicillins Other (See Comments)    Unknown, occurred as a child   Bee Venom Swelling    Severe swelling at the sting site.    Immunization History  Administered Date(s) Administered   Fluad Quad(high Dose 65+) 02/18/2019   H1N1 04/13/2008   Influenza Split 02/18/2015   Influenza, High Dose Seasonal PF 02/20/2017, 04/27/2018   Influenza,inj,Quad PF,6+ Mos 03/16/2015   Influenza-Unspecified 04/12/2008, 02/18/2011   Moderna Sars-Covid-2 Vaccination 06/21/2019, 07/18/2019, 01/25/2020   Pneumococcal Conjugate-13 01/22/2017   Tdap 09/26/2015   Zoster  Recombinat (Shingrix) 05/10/2018, 03/03/2019    Family History  Problem Relation Age of Onset   Mitral valve prolapse Mother    Dementia Father    Prostate cancer Father    Mitral valve prolapse Sister    Mitral valve prolapse Brother    Healthy Son    Healthy Daughter    Colon cancer Neg Hx    Colon polyps Neg Hx    Rectal cancer Neg Hx    Esophageal cancer Neg Hx    Stomach cancer Neg Hx    Heart attack Neg Hx      Current Outpatient Medications:    acetaminophen (TYLENOL) 500 MG tablet, Take 1,000 mg by mouth every 6 (six) hours as needed for mild pain or headache., Disp: , Rfl:    Cholecalciferol (VITAMIN D) 125 MCG (5000 UT) CAPS, Take 5,000 Units by mouth daily., Disp: , Rfl:    clopidogrel (PLAVIX) 75 MG tablet, Take 1 tablet (75 mg total) by mouth daily., Disp: 90 tablet, Rfl: 3   EPINEPHrine (EPIPEN 2-PAK) 0.3 mg/0.3 mL IJ SOAJ injection, Inject 0.3 mg into the muscle as needed for anaphylaxis., Disp: 1 each, Rfl: 1   ferrous sulfate 325 (65 FE) MG tablet, Take 1 tablet (325 mg total) by mouth daily., Disp: 30 tablet, Rfl: 6   hydrOXYzine (ATARAX/VISTARIL) 25 MG tablet, TAKE 1/2 TO 1 TABLET BY MOUTH TWICE DAILY AS NEEDED FOR ITCHING, Disp: 45 tablet, Rfl: 1   levothyroxine (SYNTHROID) 175 MCG tablet, TAKE 1 TABLET BY MOUTH EVERY DAY BEFORE BREAKFAST, Disp: 90 tablet, Rfl: 1   MELATONIN PO, Take 1 tablet by mouth at bedtime as needed (sleep)., Disp: , Rfl:    Multiple Vitamins-Minerals (MULTIVITAMIN WITH MINERALS) tablet, Take 1 tablet by mouth daily., Disp: , Rfl:    rosuvastatin (CRESTOR) 20 MG tablet, Take 1 tablet (20 mg total) by mouth daily. (Patient taking differently: Take 20 mg by mouth every evening.), Disp: 90 tablet, Rfl: 0   Tiotropium Bromide Monohydrate (SPIRIVA RESPIMAT) 1.25 MCG/ACT AERS, Inhale 2 puffs into the lungs  daily., Disp: 4 g, Rfl: 0   Turmeric 500 MG CAPS, Take 500 mg by mouth daily., Disp: , Rfl:    vitamin B-12 (CYANOCOBALAMIN) 1000 MCG tablet,  Take 1 tablet (1,000 mcg total) by mouth daily., Disp: , Rfl:    nicotine (NICODERM CQ - DOSED IN MG/24 HR) 7 mg/24hr patch, Place 1 patch (7 mg total) onto the skin daily. (Patient not taking: Reported on 02/15/2021), Disp: 28 patch, Rfl: 0      Objective:   Vitals:   02/15/21 1608  BP: 108/60  Pulse: 75  Temp: 97.6 F (36.4 C)  TempSrc: Oral  SpO2: 96%  Weight: 142 lb (64.4 kg)  Height: '5\' 6"'  (1.676 m)    Estimated body mass index is 22.92 kg/m as calculated from the following:   Height as of this encounter: '5\' 6"'  (1.676 m).   Weight as of this encounter: 142 lb (64.4 kg).  '@WEIGHTCHANGE' @  [employer]   02/15/21 1608  Weight: 142 lb (64.4 kg)     Physical Exam General: No distress. Looks well Neuro: Alert and Oriented x 3. GCS 15. Speech normal Psych: Pleasant Resp:  Barrel Chest - no.  Wheeze - no, Crackles - yes, No overt respiratory distress CVS: Normal heart sounds. Murmurs - no Ext: Stigmata of Connective Tissue Disease -has pink fingers has telangiectasias on the hands and fingers. HEENT: Normal upper airway. PEERL +. No post nasal drip .  Has telangiectasias in the face in the malar area.        Assessment:       ICD-10-CM   1. ILD (interstitial lung disease) ([place])  J84.9     2. History of Raynaud's syndrome  Z86.79     3. ANA positive  R76.8     4. Hiatal hernia  K44.9     5. DOE (dyspnea on exertion)  R06.00     6. History of smoking 25-50 pack years  Z87.891     7. History of lung cancer  Z85.118       IPAF DIAGNOSTIC CRITERIA   Classification criteria for "interstitial pneumonia with autoimmune features (IPAF)" Presence of an interstitial pneumonia (by HRCT or surgical lung biopsy) and, Exclusion of alternative etiologies and, Does not meet criteria of a defined connective tissue disease and, At least one feature from at least two of these domains: - Clinical domain - Serologic domain - Morphologic domain  A. Clinical domain -  Distal digital fissuring (ie, "mechanic hands") -Distal digital tip ulceration - Inflammatory arthritis or polyarticular morning joint stiffness ?60 minutes - Palmar telangiectasia - Raynaud phenomenon - Unexplained digital edema - Unexplained fixed rash on the digital extensor surfaces (Gottron's sign)  B. Serologic domain - ANA ?1:320 titer, diffuse, speckled, homogeneous patterns or ANA nucleolar pattern (any titer) or ANA centromere pattern (any titer)  -Rheumatoid factor ?2 upper limit of normal -Anti-CCP -Anti-dsDNA -Anti-Ro (SS-A) -Anti-La (SS-B) Anti-ribonucleoprotein -Anti-Smith -Anti-topoisomerase (Scl-70) -Anti-tRNA synthetase (eg, Jo-1, PL-7, PL-12; others are: EJ, OJ, KS, Zo, tRS) -Anti-PM-Scl - Anti-MDA-5  C. 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  Nintedanib has better data with IPAF - this means extension in quality of life but no difference in symptoms  - no study directly compares the 2 drugs but efficacy roughly equal at 1 year time point   - OFEV  - - time to first exacerbation possibly reduced in one trial but not in another - twice daily, no titration, potentially more convenient dosing  - no need for sunscreen  - high chance of mild diarrhea but low chance of  significant diarrhea needing to stop medication.   - Rx diarrhea with lomotil - slight increase in heart attack risk and theoretical increase in bleeding risk,   - need monthly blood work for 3 months and then every 6 months - monitor liver function  Discussed pirfenidone/Esbriet as well.  She wants to go with nintedanib.   Plan:     Patient Instructions     ICD-10-CM   1. ILD (interstitial lung disease) ([place])  J84.9     2. History of Raynaud's syndrome  Z86.79     3. ANA positive  R76.8     4. Hiatal hernia  K44.9     5. DOE (dyspnea on exertion)  R06.00     6. History of smoking 25-50 pack years  Z87.891     7. 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   - this is progressive - No clear evidene of COPD on PFT or CT chest but trelegy seems to work  Plan  = high dose flu shot 02/15/2021 = bivalent mRNA booster next week or so  - quit smoking  - stop trelegy -> start spiriva respimaty 1.25 at 2 puff twice daily and see if this helps  - start ofev 176m twice daily with food  - [facility] continue cardiac rehab - monitor GERD symptoms  Followup  - 6 weeks with NP or DR Ramswamy to monitor uptake with ofev   - ILD symptoms score at followup  +/- walk test simple   ( Level 05 visit: Estb 40-54 min   in  visit type: on-site physical face to visit  in total care time and counseling or/and coordination of care by this undersigned MD - Dr MBrand Males This includes one or more of the following on this same day 02/15/2021: pre-charting, chart review, note writing, documentation discussion of test results, diagnostic or treatment recommendations, prognosis, risks and benefits of management options, instructions, education, compliance or risk-factor reduction. It excludes time spent by the [place] office staff in the care of the patient. Actual time 50  min)   SIGNATURE    Dr. MBrand Males M.D., F.C.C.P,  Pulmonary and Critical Care Medicine Staff Physician, CWoodburyDirector - Interstitial Lung Disease  Program  Pulmonary [place] [id number] Pager: [phone] If no answer or between  15:00h - 7:00h: call [phone] Telephone: [phone]  5:16 PM 02/15/2021

## 2021-02-15 NOTE — Patient Instructions (Signed)
Full PFT performed today. °

## 2021-02-15 NOTE — Patient Instructions (Addendum)
    ICD-10-CM   1. ILD (interstitial lung disease) ([place])  J84.9     2. History of Raynaud's syndrome  Z86.79     3. ANA positive  R76.8     4. Hiatal hernia  K44.9     5. DOE (dyspnea on exertion)  R06.00     6. History of smoking 25-50 pack years  Z87.891     7. 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   - this is progressive - No clear evidene of COPD on PFT or CT chest but trelegy seems to work  Plan  = high dose flu shot 02/15/2021 = bivalent mRNA booster next week or so  - quit smoking  - stop trelegy -> start spiriva respimaty 1.25 at 2 puff twice daily and see if this helps  - start ofev 150mg  twice daily with food  - refer [facility] pharmacist - continue cardiac rehab - monitor GERD symptoms  Followup  - 6 weeks with NP or DR Ramswamy to monitor uptake with ofev   - ILD symptoms score at followup  +/- walk test simple  - discuss ILD PRO registry at future visit

## 2021-02-15 NOTE — Progress Notes (Signed)
Office Visit    Patient Name: Joy Williams Date of Encounter: 02/15/2021  PCP:  Lorrene Reid, [place]  Cardiologist:  Freada Bergeron, MD  Advanced Practice Provider:  No care team member to display Electrophysiologist:  None    Chief Complaint    Joy Williams is a 70 y.o. 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/1094   Complication of anesthesia    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had to have botox injection)   COPD (chronic obstructive pulmonary disease) ([place])    Family history of adverse reaction to anesthesia    Mother- very sensitive to medication   Heart murmur    MVP   Hypothyroidism    Lung cancer ([place]) 12/30/2014   Synchronous primaries:  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, each treated by wedge resection with [facility]op adjuvant [facility]   MVP (mitral valve prolapse) 2018   s/p Minimally-Invasive Mitral Valve Replacement w/  [facility] Mitral bovine bioprosthetic tissue valve (size 53mm, model # 7300TFX, serial # [id number])   PONV (postoperative nausea and vomiting)    Raynaud's syndrome    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   31 mm [facility] mitral bovine bioprosthetic tissue valve placed via right mini thoracotomy approach   Severe mitral regurgitation 11/15/2014   Shortness of breath dyspnea    with exertion   STD (sexually  transmitted disease)    STEMI (ST elevation myocardial infarction) ([place]) 10/24/2020   Telangiectasia    Past Surgical History:  Procedure Laterality Date   BACK SURGERY     x 3  Disectomy   BREAST BIOPSY Left    CARDIAC CATHETERIZATION N/A 03/08/2016   Procedure: Right/Left Heart Cath and Coronary Angiography;  Surgeon: Sherren Mocha, MD;  Location: [place] CV [facility];  Service: Cardiovascular;  Laterality: N/A;   CLAVICLE SURGERY Left 2013   plate to left collar bone   ENDARTERECTOMY Right 08/09/2020   Procedure: RIGHT CAROTID ENDARTERECTOMY;  Surgeon: Waynetta Sandy, MD;  Location: [place];  Service: Vascular;  Laterality: Right;   LAPAROSCOPY     ? reason-age 50    LEFT HEART CATH AND CORONARY ANGIOGRAPHY N/A 02/25/2019   Procedure: LEFT HEART CATH AND CORONARY ANGIOGRAPHY;  Surgeon: Jettie Booze, MD;  Location: [place] [facility];  Service: Cardiovascular;  Laterality: N/A;   LEFT HEART CATH AND CORONARY ANGIOGRAPHY N/A 10/24/2020   Procedure: LEFT HEART CATH AND CORONARY ANGIOGRAPHY;  Surgeon: Jettie Booze, MD;  Location: [place] [facility];  Service: Cardiovascular;  Laterality: N/A;   LUNG CANCER SURGERY     MITRAL VALVE REPAIR Right 06/27/2016   Procedure: MINIMALLY INVASIVE MITRAL VALVE REPLACEMENT;  Surgeon: Rexene Alberts, MD;  Location: [place];  Service: Open Heart Surgery;  Laterality: Right;   TEE WITHOUT CARDIOVERSION N/A 02/22/2016   Procedure: TRANSESOPHAGEAL ECHOCARDIOGRAM (TEE);  Surgeon: Dorothy Spark, MD;  Location: [place];  Service: Cardiovascular;  Laterality: N/A;   TEE WITHOUT CARDIOVERSION N/A 06/27/2016   Procedure: TRANSESOPHAGEAL ECHOCARDIOGRAM (TEE);  Surgeon: Rexene Alberts, MD;  Location: [place];  Service: Open Heart Surgery;  Laterality: N/A;   TONSILLECTOMY     VIDEO ASSISTED THORACOSCOPY (VATS)/WEDGE RESECTION Left 12/30/2014   Bronchoscopy, Mediastinoscopy, Left VATS for Wedge resection LUL x2 adn LLL x1 - Dr. Elenor Quinones at [facility] Left 2017   injected with botox   wrist surgery Left 2015   plate to wrist     Allergies  Allergies  Allergen Reactions   Penicillins Other (See Comments)    Unknown, occurred as a child   Bee Venom Swelling    Severe swelling at the sting site.    History of Present Illness    Joy Williams is a 70 y.o. female with a hx of nonobstructive CAD, MVP s/p minimally invasive bioprosthetic MVR, carotid artery disease s/p right CEA, scleroderma, cold agglutinin disease, Raynaud's disease, UIP, NSCLC, COPD, hypothyroidism, Takotsubo cardiomyopathy last seen 11/15/2020.  She was admitted October 2020 with chest discomfort.  High-sensitivity troponins were elevated.  There was concern for NSTEMI however cardiac cath demonstrated no CAD.  Echocardiogram with normal LV function and normally functioning mitral valve prosthesis.  She was followed by Dr. Debara Pickett in the hospital and felt that her presentation was consistent with [facility] (myocardial infarction with nonobstructive coronary disease).  Underwent cardiac MRI showing focal subendocardial LGE into the mid inferior lateral wall consistent with infarct seen on territory with concern for thromboembolic event given clean cath.  She was managed medically and working with cardiac rehab at that time.  She had the monitor November 2020 due to palpitations showing brief runs of SVT but no significant arrhythmias nor atrial fibrillation  Readmitted March 2022 with dizziness and arm and coordination.  MRI brain without acute infarct but showed multiple scattered remote lacunar infarcts.  CTA head and neck with bulky calcified plaque about proximal ICAs bilaterally with associated stenosis of up to 65% on the right and up to 50% on the left.  Echocardiogram LVEF 65-70%, and WMA, mild LVH, moderate left atrial dilation, mild MVR.  She underwent right CEA 08/09/2020 with [name].  She was continued on aspirin, Plavix, statin.  Admitted  June 2022 with STEMI with cardiac catheterization showing 25% proximal LAD stenosis otherwise clean coronary arteries and EF 25 to 35% consistent with Takotsubo cardiomyopathy.  She was last seen 11/15/2020 by Dr. Johney Frame.  She was feeling much improved since last admission.  Repeat echo 11/22/2020 with LVEF returned to normal 60 to 65%, mild LVH, trivial central MR with no stenosis.  She presents today for follow-up.  Endorses feeling overall well since last seen.  She has been able to establish with rheumatology for evaluation for possible scleroderma.  She tells me later today she is going to her 42 year old granddaughters volleyball game and she is spending the night.  She had cardiac rehab orientation earlier this week and is excited to start. Reports no shortness of breath at rest and stable dyspnea on exertion. Reports no chest pain, pressure, or tightness. No edema, orthopnea, PND. Reports no palpitations.    EKGs/Labs/Other Studies Reviewed:   The following studies were reviewed today: Echo 11/22/20  1. Left ventricular ejection fraction, by estimation, is 60 to 65%. Left  ventricular ejection fraction by 3D volume is 62 %. The left ventricle has  normal function. There is mild concentric left ventricular hypertrophy.  Left ventricular diastolic  function could not be evaluated.   2. Right ventricular systolic function is normal. The right ventricular  size is normal. There is normal pulmonary artery systolic pressure. The  estimated right ventricular systolic pressure is 62.7 mmHg.   3. Left atrial size was mild to moderately dilated.   4. 31 mm Magna Ease in the mitral position 06/27/2016. MG 2 mmHG @ 65 bpm.  EOA 2.27 cm2. Trivial central MR. No stenosis. Normal prosthesis. Trivial  mitral valve regurgitation. The mean mitral valve gradient is 2.0 mmHg  with average heart rate of 65 bpm.  There is a 31 mm bioprosthetic valve present in the mitral position.  Procedure Date: 06/27/16.    5. The aortic valve is tricuspid. Aortic valve regurgitation is not  visualized. No aortic stenosis is present.   6. The inferior vena cava is normal in size with greater than 50%  respiratory variability, suggesting right atrial pressure of 3 mmHg.   Comparison(s): Changes from prior study are noted. EF is now 60-65%. WMA  have resolved.  LHC 10/24/20: Prox LAD lesion is 25% stenosed. No significant CAD. The left ventricular systolic function is normal. LV end diastolic pressure is mildly elevated. The left ventricular ejection fraction is 25-35% by visual estimate. There is no aortic valve stenosis. Prior mitral valve surgery.   Despite lateral ST elevation, we only found nonobstructive coronary artery disease.  Left ventriculogram fits with a pattern of Takotsubo cardiomyopathy.  LV function significantly different from her prior catheterization.  She will need aggressive medical therapy for LV dysfunction.   I discussed the findings with her daughter.     Echocardiogram 10/25/20: 1. Left ventricular ejection fraction, by estimation, is 25 to 30%. The  left ventricle has severely decreased function. The left ventricle  demonstrates global hypokinesis. Left ventricular diastolic parameters are  consistent with Grade I diastolic  dysfunction (impaired relaxation).   2. Right ventricular systolic function is normal. The right ventricular  size is normal. There is moderately elevated pulmonary artery systolic  pressure. The estimated right ventricular systolic pressure is 03.5 mmHg.   3. Left atrial size was moderately dilated.   4. The mitral valve has been repaired/replaced. No evidence of mitral  valve regurgitation. No evidence of mitral stenosis. Severe mitral annular  calcification. Echo findings are consistent with normal structure and  function of the mitral valve  prosthesis.   5. The aortic valve is normal in structure. Aortic valve regurgitation is  not visualized. No aortic  stenosis is present.   6. The inferior vena cava is normal in size with greater than 50%  respiratory variability, suggesting right atrial pressure of 3 mmHg.   Comparison(s): Prior images unable to be directly viewed, comparison made  by report only. The left ventricular function is worsened.    EKG:   NoEKG is  ordered today.  The ekg independently reviewed from 12/13/2020 demonstrated NSR 66 bpm with no acute ST/T wave changes.  Recent Labs: 10/25/2020: Magnesium 1.9; TSH 0.478 11/30/2020: ALT 12; BUN 10; Creatinine, Ser 0.74; Hemoglobin 13.5; Platelets 211; Potassium 4.3; Sodium 136  Recent Lipid Panel    Component Value Date/Time   CHOL 135 10/24/2020 1545   CHOL 150 11/11/2018 0946   CHOL 177 10/28/2014 1025   TRIG 100 10/24/2020 1545   TRIG 115 10/28/2014 1025   HDL 60 10/24/2020 1545   HDL 70 11/11/2018 0946   HDL 70  10/28/2014 1025   CHOLHDL 2.3 10/24/2020 1545   VLDL 20 10/24/2020 1545   LDLCALC 55 10/24/2020 1545   LDLCALC 64 11/11/2018 0946   LDLCALC 84 10/28/2014 1025   LDLDIRECT 63 06/15/2020 1446    Home Medications   Current Meds  Medication Sig   acetaminophen (TYLENOL) 500 MG tablet Take 1,000 mg by mouth every 6 (six) hours as needed for mild pain or headache.   Cholecalciferol (VITAMIN D) 125 MCG (5000 UT) CAPS Take 5,000 Units by mouth daily.   clopidogrel (PLAVIX) 75 MG tablet Take 1 tablet (75 mg total) by mouth daily.   EPINEPHrine (EPIPEN 2-PAK) 0.3 mg/0.3 mL IJ SOAJ injection Inject 0.3 mg into the muscle as needed for anaphylaxis.   ferrous sulfate 325 (65 FE) MG tablet Take 1 tablet (325 mg total) by mouth daily.   Fluticasone-Umeclidin-Vilant (TRELEGY ELLIPTA) 100-62.5-25 MCG/INH AEPB TAKE 1 PUFF BY MOUTH EVERY DAY   hydrOXYzine (ATARAX/VISTARIL) 25 MG tablet TAKE 1/2 TO 1 TABLET BY MOUTH TWICE DAILY AS NEEDED FOR ITCHING   levothyroxine (SYNTHROID) 175 MCG tablet TAKE 1 TABLET BY MOUTH EVERY DAY BEFORE BREAKFAST   MELATONIN PO Take 1 tablet by  mouth at bedtime as needed (sleep).   Multiple Vitamins-Minerals (MULTIVITAMIN WITH MINERALS) tablet Take 1 tablet by mouth daily.   nicotine (NICODERM CQ - DOSED IN MG/24 HR) 7 mg/24hr patch Place 1 patch (7 mg total) onto the skin daily. (Patient taking differently: Place 7 mg onto the skin daily as needed (nicotine cravings).)   predniSONE (STERAPRED UNI-PAK 21 TAB) 10 MG (21) TBPK tablet 6 day taper - take by mouth as directed for 6 days   rosuvastatin (CRESTOR) 20 MG tablet Take 1 tablet (20 mg total) by mouth daily. (Patient taking differently: Take 20 mg by mouth every evening.)   Turmeric 500 MG CAPS Take 500 mg by mouth daily.   vitamin B-12 (CYANOCOBALAMIN) 1000 MCG tablet Take 1 tablet (1,000 mcg total) by mouth daily.     Review of Systems      All other systems reviewed and are otherwise negative except as noted above.  Physical Exam    VS:  BP 126/82   Pulse 84   Ht 5\' 6"  (1.676 m)   Wt 143 lb (64.9 kg)   SpO2 94%   BMI 23.08 kg/m  , BMI Body mass index is 23.08 kg/m.  Wt Readings from Last 3 Encounters:  02/15/21 143 lb (64.9 kg)  02/13/21 141 lb 12.1 oz (64.3 kg)  01/25/21 141 lb (64 kg)    GEN: Well nourished, well developed, in no acute distress. HEENT: normal. Neck: Supple, no JVD, carotid bruits, or masses. Cardiac: RRR, no murmurs, rubs, or gallops. No clubbing, cyanosis, edema.  Radials/PT 2+ and equal bilaterally.  Respiratory:  Respirations regular and unlabored, clear to auscultation bilaterally. GI: Soft, nontender, nondistended. MS: No deformity or atrophy. Skin: Warm and dry, no rash. Neuro:  Strength and sensation are intact. Psych: Normal affect.  Assessment & Plan    Nonobstructive CAD- Stable with no anginal symptoms. No indication for ischemic evaluation.  GDMT includes Plavix, statin.  No beta-blocker due to history of orthostatic hypotension  Nonischemic cardiomyopathy/Takotsubo cardiomyopathy-LVEF recovered by echo 11/2020.  Euvolemic on  exam.  GDMT has been previously limited by orthostatic hypotension. Heart healthy diet and regular cardiovascular exercise encouraged.  She had cardiac rehab orientation this week and is excited to participate.  MVP with severe MR s/p bioprosthetic MVR in 2018-no stenosis and  trivial MR by most recent echocardiogram 11/2020.  Continue optimal blood pressure and volume control.  Orthostatic hypotension-symptoms well controlled with adequate hydration.  Reports no recent lightheadedness nor dizziness.  Continue to orthostatic precautions discussed.  Carotid artery disease s/p right CEA 07/2020 - Continue aspirin, plavix, Statin.  List with vascular surgery.  UIP -follows with pulmonology.  Disposition: Follow up in 5 month(s) with Dr. Johney Frame or APP.  Signed, Loel Dubonnet, NP 02/15/2021, 11:02 AM [place]

## 2021-02-15 NOTE — Patient Instructions (Signed)
Medication Instructions:  Continue your current medications.   *If you need a refill on your cardiac medications before your next appointment, please call your pharmacy*   Lab Work: None ordered today.   Testing/Procedures: Your recent EKG in the emergency department looked great.   Follow-Up: At [facility], you and your health needs are our priority.  As part of our continuing mission to provide you with exceptional heart care, we have created designated Provider Care Teams.  These Care Teams include your primary Cardiologist (physician) and Advanced Practice Providers (APPs -  Physician Assistants and Nurse Practitioners) who all work together to provide you with the care you need, when you need it.  We recommend signing up for the patient portal called "MyChart".  Sign up information is provided on this After Visit Summary.  MyChart is used to connect with patients for Virtual Visits (Telemedicine).  Patients are able to view lab/test results, encounter notes, upcoming appointments, etc.  Non-urgent messages can be sent to your provider as well.   To learn more about what you can do with MyChart, go to [email or web address].    Your next appointment:   4-6 month(s)  The format for your next appointment:   In Person  Provider:   You may see Freada Bergeron, MD or one of the following Advanced Practice Providers on your designated Care Team:   Richardson Dopp, PA-C [place], [place] Loel Dubonnet, NP   Other Instructions  Heart Healthy Diet Recommendations: A low-salt diet is recommended. Meats should be grilled, baked, or boiled. Avoid fried foods. Focus on lean protein sources like fish or chicken with vegetables and fruits. The American Heart Association is a Microbiologist!  Exercise recommendations: The American Heart Association recommends 150 minutes of moderate intensity exercise weekly. Try 30 minutes of moderate intensity exercise 4-5 times per week. This  could include walking, jogging, or swimming.

## 2021-02-15 NOTE — Telephone Encounter (Signed)
Per request from Dr. Chase Caller, [facility] labs faxed to their office. Okay per Hazel Sams, PA-C.  [facility] labs are negative.

## 2021-02-19 ENCOUNTER — Other Ambulatory Visit: Payer: Self-pay

## 2021-02-19 ENCOUNTER — Encounter (HOSPITAL_COMMUNITY)
Admission: RE | Admit: 2021-02-19 | Discharge: 2021-02-19 | Disposition: A | Discharge status: Home / Self Care | Payer: Medicare Other | Source: Ambulatory Visit | Attending: Cardiology | Admitting: Cardiology

## 2021-02-19 DIAGNOSIS — I5181 Takotsubo syndrome: Secondary | ICD-10-CM | POA: Insufficient documentation

## 2021-02-19 DIAGNOSIS — Z5189 Encounter for other specified aftercare: Secondary | ICD-10-CM | POA: Insufficient documentation

## 2021-02-19 DIAGNOSIS — I252 Old myocardial infarction: Secondary | ICD-10-CM | POA: Insufficient documentation

## 2021-02-19 NOTE — Progress Notes (Signed)
Daily Session Note  Patient Details  Name: Joy Williams MRN: [id number] Date of Birth: Jan 15, 1951 Referring Provider:   Flowsheet Row CARDIAC REHAB PHASE II ORIENTATION from 02/13/2021 in [place]  Referring Provider Joy Kaufman, MD       Encounter Date: 02/19/2021  Check In:  Session Check In - 02/19/21 0923       Check-In   Supervising physician immediately available to respond to emergencies [facility] immediately available    Physician(s) Dr. Reesa Chew    Location [facility]    Staff Present Joy Rubenstein, MS, ACSM-CEP, CCRP, Exercise Physiologist;Joy Whitaker, RN, Joy Pretty, MS, ACSM CEP, Exercise Physiologist;Joy Williams BS, ACSM EP-C, Exercise Physiologist;Joy Rosana Hoes, MS, ACSM-CEP, Exercise Physiologist    Virtual Visit No    Medication changes reported     No    Fall or balance concerns reported    No    Tobacco Cessation No Change    Current number of cigarettes/nicotine per day     3    Warm-up and Cool-down Performed on first and last piece of equipment    Resistance Training Performed Yes    VAD Patient? No    PAD/SET Patient? No      Pain Assessment   Currently in Pain? No/denies    Pain Score 0-No pain    Multiple Pain Sites No             Capillary Blood Glucose: No results found for this or any previous visit (from the past 24 hour(s)).   Exercise Prescription Changes - 02/19/21 1200       Response to Exercise   Blood Pressure (Admit) 124/72    Blood Pressure (Exercise) 122/82    Blood Pressure (Exit) 105/76    Heart Rate (Admit) 70 bpm    Heart Rate (Exercise) 95 bpm    Heart Rate (Exit) 76 bpm    Oxygen Saturation (Admit) 99 %    Oxygen Saturation (Exercise) 98 %    Oxygen Saturation (Exit) 98 %    Rating of Perceived Exertion (Exercise) 11    Perceived Dyspnea (Exercise) 0    Symptoms 2 bouts of lightheadedness: nustep and weights.    Comments Pt's first day  in hte CRP2 program    Duration Progress to 30 minutes of  aerobic without signs/symptoms of physical distress    Intensity THRR unchanged      Progression   Progression Continue to progress workloads to maintain intensity without signs/symptoms of physical distress.    Average METs 2.3      Resistance Training   Training Prescription Yes    Weight 2 lbs    Reps 10-15    Time 10 Minutes      Interval Training   Interval Training No      NuStep   Level 2    SPM 80    Minutes 15    METs 1.9      Track   Laps 15    Minutes 15    METs 2.74             Social History   Tobacco Use  Smoking Status Every Day   Packs/day: 0.75   Years: 24.00   Pack years: 18.00   Types: E-cigarettes, Cigarettes   Start date: 05/21/1983  Smokeless Tobacco Never  Tobacco Comments   Currently smoking 3cigs per day as of 02/15/21    Goals Met:  Completed weight training.  Goals Unmet:  Not Applicable  Comments: Joy Williams started cardiac rehab today. Joy Williams reported feeling lightheaded after completing exercise on the nustep resolved after rest.Joy Williams then reported feeling lightheaded after completing weight training telemetry-Sinus Rhythm,.  Medication list reconciled. Pt denies barriers to medicaiton compliance.  PSYCHOSOCIAL ASSESSMENT:  PHQ-0. Pt exhibits positive coping skills, hopeful outlook with supportive family. No psychosocial needs identified at this time, no psychosocial interventions necessary.    Pt enjoys flowers, spending time with her grandchildren and cooking.   Pt oriented to exercise equipment and routine.    Understanding verbalized. Post exercise blood pressure 104/70. Standing blood pressure 82/68. Patient given water. Joy Williams PAC paged and notified. Joy instructed Joy Williams to make sure that she is drinking enough water and and to wear compression stockings. Joy Williams states understanding and reports having compression stockings at home. Repeat exit BP 116/84 Joy Pall,  RN,BSN 02/19/2021 12:28 PM    Dr. Fransico Him is Medical Director for Cardiac Rehab at [facility].

## 2021-02-21 ENCOUNTER — Telehealth: Payer: Self-pay | Admitting: Pharmacist

## 2021-02-21 ENCOUNTER — Other Ambulatory Visit: Payer: Self-pay

## 2021-02-21 ENCOUNTER — Encounter (HOSPITAL_COMMUNITY)
Admission: RE | Admit: 2021-02-21 | Discharge: 2021-02-21 | Disposition: A | Discharge status: Home / Self Care | Payer: Medicare Other | Source: Ambulatory Visit | Attending: Cardiology | Admitting: Cardiology

## 2021-02-21 DIAGNOSIS — I214 Non-ST elevation (NSTEMI) myocardial infarction: Secondary | ICD-10-CM

## 2021-02-21 DIAGNOSIS — I252 Old myocardial infarction: Secondary | ICD-10-CM | POA: Diagnosis not present

## 2021-02-21 DIAGNOSIS — Z5189 Encounter for other specified aftercare: Secondary | ICD-10-CM | POA: Diagnosis not present

## 2021-02-21 DIAGNOSIS — I5181 Takotsubo syndrome: Secondary | ICD-10-CM | POA: Diagnosis not present

## 2021-02-21 NOTE — Progress Notes (Signed)
Subjective:  Ms. Talwar presents today to [facility] to see pharmacy team for Ofev new start counseling.   Patient was last seen and referred by Dr. Chase Caller on 02/15/21. Pertinent past medical history includes Takotsubo cardiomyopathy, NSTEMI in June 2022, TIA in March 5638, chronic diastolic CHF (EF 93-73%), history of malignant lung neoplasm treated with wedge resection and adjuvant chemo, COPD, asthmatic bronchitis, age-related osteoporosis, incomplete CREST syndrome (history of Raynaud's). She saw Dr. Benjamine Mola at rheumatology and was not determined to have any autoimmune diagnosis but does not have positive autoimmune features including Raynaud's.    She reviewed Ofev vs Esbriet at her OV with Dr. Chase Caller including that Dickey Gave has more clinical data in patient's with autoimmune features than Esbriet. Today she presents with serious hesitation with starting a medication. She does not recall reviewing Esbriet as an option with Dr. Chase Caller but admits there was knowledge overload during the visit.  History of CAD: Yes History of MI: Yes Current anticoagulant use: No History of HTN: No  History of elevated LFTs: No History of diarrhea, nausea, vomiting: No  Objective: Allergies  Allergen Reactions   Penicillins Other (See Comments)    Unknown, occurred as a child   Bee Venom Swelling    Severe swelling at the sting site.    Outpatient Encounter Medications as of 02/22/2021  Medication Sig   acetaminophen (TYLENOL) 500 MG tablet Take 1,000 mg by mouth every 6 (six) hours as needed for mild pain or headache.   Cholecalciferol (VITAMIN D) 125 MCG (5000 UT) CAPS Take 5,000 Units by mouth daily.   clopidogrel (PLAVIX) 75 MG tablet Take 1 tablet (75 mg total) by mouth daily.   EPINEPHrine (EPIPEN 2-PAK) 0.3 mg/0.3 mL IJ SOAJ injection Inject 0.3 mg into the muscle as needed for anaphylaxis.   ferrous sulfate 325 (65 FE) MG tablet Take 1 tablet (325 mg total) by mouth daily.    hydrOXYzine (ATARAX/VISTARIL) 25 MG tablet TAKE 1/2 TO 1 TABLET BY MOUTH TWICE DAILY AS NEEDED FOR ITCHING   levothyroxine (SYNTHROID) 175 MCG tablet TAKE 1 TABLET BY MOUTH EVERY DAY BEFORE BREAKFAST   MELATONIN PO Take 1 tablet by mouth at bedtime as needed (sleep).   Multiple Vitamins-Minerals (MULTIVITAMIN WITH MINERALS) tablet Take 1 tablet by mouth daily.   nicotine (NICODERM CQ - DOSED IN MG/24 HR) 7 mg/24hr patch Place 1 patch (7 mg total) onto the skin daily. (Patient not taking: Reported on 02/15/2021)   rosuvastatin (CRESTOR) 20 MG tablet Take 1 tablet (20 mg total) by mouth daily. (Patient taking differently: Take 20 mg by mouth every evening.)   Tiotropium Bromide Monohydrate (SPIRIVA RESPIMAT) 1.25 MCG/ACT AERS Inhale 2 puffs into the lungs daily.   Turmeric 500 MG CAPS Take 500 mg by mouth daily.   vitamin B-12 (CYANOCOBALAMIN) 1000 MCG tablet Take 1 tablet (1,000 mcg total) by mouth daily.   No facility-administered encounter medications on file as of 02/22/2021.     Immunization History  Administered Date(s) Administered   Fluad Quad(high Dose 65+) 02/18/2019, 02/15/2021   H1N1 04/13/2008   Influenza Split 02/18/2015   Influenza, High Dose Seasonal PF 02/20/2017, 04/27/2018   Influenza,inj,Quad PF,6+ Mos 03/16/2015   Influenza-Unspecified 04/12/2008, 02/18/2011   Moderna Sars-Covid-2 Vaccination 06/21/2019, 07/18/2019, 01/25/2020   Pneumococcal Conjugate-13 01/22/2017   Tdap 09/26/2015   Zoster Recombinat (Shingrix) 05/10/2018, 03/03/2019    PFT's TLC  Date Value Ref Range Status  02/15/2021 3.83 L Final    CMP     Component  Value Date/Time   NA 136 11/30/2020 1110   K 4.3 11/30/2020 1110   CL 97 11/30/2020 1110   CO2 24 11/30/2020 1110   GLUCOSE 85 11/30/2020 1110   GLUCOSE 96 11/03/2020 1259   BUN 10 11/30/2020 1110   CREATININE 0.74 11/30/2020 1110   CALCIUM 9.9 11/30/2020 1110   PROT 7.1 11/30/2020 1110   ALBUMIN 4.6 11/30/2020 1110   AST 23 11/30/2020  1110   ALT 12 11/30/2020 1110   ALKPHOS 74 11/30/2020 1110   BILITOT 0.3 11/30/2020 1110   GFRNONAA >60 11/03/2020 1259   GFRAA 99 06/15/2020 1446    CBC    Component Value Date/Time   WBC 6.7 11/30/2020 1110   WBC 8.7 11/03/2020 1259   RBC 4.89 11/30/2020 1110   RBC 4.54 11/03/2020 1259   HGB 13.5 11/30/2020 1110   HCT 41.6 11/30/2020 1110   PLT 211 11/30/2020 1110   MCV 85 11/30/2020 1110   MCH 27.6 11/30/2020 1110   MCH 26.9 11/03/2020 1259   MCHC 32.5 11/30/2020 1110   MCHC 31.1 11/03/2020 1259   RDW 13.6 11/30/2020 1110   LYMPHSABS 1.1 11/30/2020 1110   MONOABS 0.6 11/03/2020 1259   EOSABS 0.1 11/30/2020 1110   BASOSABS 0.0 11/30/2020 1110      LFT's Hepatic Function Latest Ref Rng & Units 11/30/2020 11/03/2020 10/24/2020  Total Protein 6.0 - 8.5 g/dL 7.1 6.9 7.2  Albumin 3.8 - 4.8 g/dL 4.6 3.7 4.1  AST 0 - 40 IU/L 23 28 33  ALT 0 - 32 IU/L _0 Alk Phosphatase 44 - 121 IU/L 74 54 57  Total Bilirubin 0.0 - 1.2 mg/dL 0.3 0.4 0.5  Bilirubin, Direct 0.0 - 0.3 mg/dL - - -    HRCT (10/19/20) - mild pulmonary fibrosis in a pattern with apical to basal gradient, featuring irregular peripheral interstitial opacity, septal thickening, traction bronchiectasis, and subpleural bronchiolectasis at the lung basis without clear evidence of honeycombing. Fibrotic findings are worsened over time on prior examinations dating back to 11/16/2014. Findings are categorized as probable UIP per consensus guidelines  Assessment and Plan  Today I reviewed both Ofev and Esbriet with her. I discussed that Esbriet is only FDA-approed for IPF whereas Ofev has more clinical data in patients with autoimmune disease though she only has autoimmune features and no clinically diagnosed autoimmune condition. We reviewed that Dr. Chase Caller preferred Dickey Gave to [facility] for her given her clinical pictures but ultiamtely she could try Esbriet if you wanted and preferred it to [facility]  We reviewed risks in  detail of Ofev vs Esbriet and she states that she'd like to continue to think about starting Ofev. She is not considering Esbriet at this time since it does not have the same bulk and rigor of supporting literature in non-IPF patients. I also discussed in details that Dr. Chase Caller and the clinic providers help to manage side effects hand-in-hand with her to try to keep her on therapy. She states she is less concerned about diarrhea and moreso of cardiovascular and bleeding risks.  She will reach back out to me once she has processed the information and handouts provided today and once she makes decision to move forward.  Ofev Medication Management Thoroughly counseled patient on the efficacy, mechanism of action, dosing, administration, adverse effects, and monitoring parameters of Ofev. Patient verbalized understanding. Patient education handout provided.   Goals of Therapy: Will not stop or reverse the progression of ILD. It will slow the progression of  ILD.  Inhibits tyrosine kinase inhibitors which slow the fibrosis/progression of ILD -Significant reduction in the rate of disease progression was observed after treatment (61.1% [before] vs 33.3% [after], P?=?0.008) over 42 weeks.  Dosing: 150 mg (one capsule) by mouth twice daily (approx 12 hours apart). Discussed taking with food approximately 12 hours apart. Discussed that capsule should not be crushed or split.  Adverse Effects: Nausea, vomiting, diarrhea (2 in 3 patients) appetite loss, weight loss - management of diarrhea with loperamide discussed including max use of 48 hours and max of 8 capsules per day. Abdominal pain (up to 1 in 5 patients) Nasopharyngitis (13%), UTI (6%) Risk of thrombosis (3%) and acute MI (2%) Hypertension (5%) Dizziness Fatigue (10%) Reviewed post-marketing data of arterial thromboembolic events (1.6% in Ofev vs 1% in placebo in IPF patients, and no difference in patients with SSc-ILD and chronic fibrosing  ILDs)  Monitoring: Monitor for diarrhea, nausea and vomiting, GI perforation, hepatotoxicity  Monitor LFTs - baseline, monthly for first 6 months, then every 3 months routinely CBC w differential at baseline and every 3 months routinely  Access: Ofev BIV started today per verbal consent from patient. She would like to be notified of cost through insurance prior to submission of patient assistance application.  [employer] patient assistance application completed today. She brought income documentation to her visit today.  Medication Reconciliation A drug regimen assessment was performed, including review of allergies, interactions, disease-state management, dosing and immunization history. Medications were reviewed with the patient, including name, instructions, indication, goals of therapy, potential side effects, importance of adherence, and safe use.  Anticoagulant use: No Antiplatelet use: clopidogrel  Immunizations She is UTD on influenza, zoster, and pneumonia vaccines. Patient has received 3 COVID19 vaccines and is receiving bivalent booster upcoming week.  She will reach out to me (provided with direct office number) once she has made decision. She is concerned about cardiovascular risk and bleeding, though I reviewed in detail that  This appointment required 60 minutes of patient care (this includes precharting, chart review, review of results, face-to-face care, etc.).  Thank you for involving pharmacy to assist in providing this patient's care.   Knox Saliva, PharmD, MPH, BCPS Clinical Pharmacist (Rheumatology and Pulmonology)

## 2021-02-21 NOTE — Telephone Encounter (Signed)
Please start Ofev BIV.  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 She will plan to bring income documents to appt  Patient hesitant to start so can wait to start BIV until after pharmacy visit  Knox Saliva, PharmD, MPH, BCPS Clinical Pharmacist (Rheumatology and Pulmonology)

## 2021-02-22 ENCOUNTER — Other Ambulatory Visit (HOSPITAL_COMMUNITY): Payer: Self-pay

## 2021-02-22 ENCOUNTER — Ambulatory Visit
Admission: RE | Admit: 2021-02-22 | Discharge: 2021-02-22 | Disposition: A | Discharge status: Home / Self Care | Payer: Medicare Other | Source: Ambulatory Visit | Attending: Physician Assistant | Admitting: Physician Assistant

## 2021-02-22 ENCOUNTER — Ambulatory Visit: Payer: Medicare Other | Admitting: Pharmacist

## 2021-02-22 DIAGNOSIS — Z7189 Other specified counseling: Secondary | ICD-10-CM

## 2021-02-22 DIAGNOSIS — Z1231 Encounter for screening mammogram for malignant neoplasm of breast: Secondary | ICD-10-CM | POA: Diagnosis not present

## 2021-02-22 NOTE — Telephone Encounter (Signed)
After pharmacy clinic visit today, patient would like to know how much copay is before patient assistance application is submitted to [facility]. She brought income documentation to office visit today and application is complete with both signed patient and provider portions. Placing in "PAP pending info" folder in pharmacy office for now  Knox Saliva, PharmD, MPH, BCPS Clinical Pharmacist (Rheumatology and Pulmonology)

## 2021-02-22 NOTE — Telephone Encounter (Signed)
Received notification from [place] regarding a prior authorization for OFEV 150mg . Authorization has been APPROVED from 01/23/21 to 02/22/2022.   Test claim did not process yet to reveal copay  Authorization # Key: [id number] - [place] Case ID: [id number]

## 2021-02-23 ENCOUNTER — Other Ambulatory Visit: Payer: Self-pay | Admitting: Family Medicine

## 2021-02-23 ENCOUNTER — Other Ambulatory Visit: Payer: Self-pay

## 2021-02-23 ENCOUNTER — Encounter (HOSPITAL_COMMUNITY)
Admission: RE | Admit: 2021-02-23 | Discharge: 2021-02-23 | Disposition: A | Discharge status: Home / Self Care | Payer: Medicare Other | Source: Ambulatory Visit | Attending: Cardiology | Admitting: Cardiology

## 2021-02-23 DIAGNOSIS — I214 Non-ST elevation (NSTEMI) myocardial infarction: Secondary | ICD-10-CM

## 2021-02-23 DIAGNOSIS — Z5189 Encounter for other specified aftercare: Secondary | ICD-10-CM | POA: Diagnosis not present

## 2021-02-23 DIAGNOSIS — Z780 Asymptomatic menopausal state: Secondary | ICD-10-CM

## 2021-02-23 DIAGNOSIS — I5181 Takotsubo syndrome: Secondary | ICD-10-CM | POA: Diagnosis not present

## 2021-02-23 DIAGNOSIS — I252 Old myocardial infarction: Secondary | ICD-10-CM | POA: Diagnosis not present

## 2021-02-23 NOTE — Progress Notes (Signed)
Office Visit Note  Patient: Joy Williams             Date of Birth: Mar 06, 1951           MRN: [id number]             PCP: Lorrene Reid, PA-C Referring: Lorrene Reid, PA-C Visit Date: 03/05/2021 Occupation: '@GUAROCC' @  Subjective:  Raynaud's and shortness of breath   History of Present Illness: Joy Williams is a 70 y.o. female with a history of interstitial lung disease.  She states she continues to have mild Raynauds symptoms.  Her symptoms usually gets worse during the winter months.  She denies any skin tightness or joint pain.  She was recently evaluated by Dr. Chase Caller and was prescribed OFEV.  She has not started the prescription yet.  She continues to have some shortness of breath.  She states she is going to cardiac rehab and will be starting pulmonary rehab soon.  Activities of Daily Living:  Patient reports morning stiffness for 5-10 minutes.   Patient Reports nocturnal pain.  Difficulty dressing/grooming: Denies Difficulty climbing stairs: Denies Difficulty getting out of chair: Denies Difficulty using hands for taps, buttons, cutlery, and/or writing: Denies  Review of Systems  Constitutional:  Positive for fatigue.  HENT:  Negative for mouth sores, mouth dryness and nose dryness.   Eyes:  Negative for pain, itching and dryness.  Respiratory:  Positive for shortness of breath. Negative for difficulty breathing.   Cardiovascular:  Negative for chest pain and palpitations.  Gastrointestinal:  Negative for blood in stool, constipation and diarrhea.  Endocrine: Negative for increased urination.  Genitourinary:  Negative for difficulty urinating.  Musculoskeletal:  Positive for joint pain, joint pain, myalgias, morning stiffness and myalgias. Negative for joint swelling and muscle tenderness.  Skin:  Positive for color change. Negative for rash and redness.  Allergic/Immunologic: Negative for susceptible to infections.  Neurological:  Positive for  light-headedness and numbness. Negative for dizziness, headaches and weakness.  Hematological:  Positive for bruising/bleeding tendency.  Psychiatric/Behavioral:  Negative for confusion.    PMFS History:  Patient Active Problem List   Diagnosis Date Noted   Bee sting allergy 12/13/2020   Pruritus 92/44/6286   Acute systolic CHF (congestive heart failure) ([place]) 10/25/2020   STEMI (ST elevation myocardial infarction) ([place]) 10/24/2020   Precordial chest pain 10/24/2020   Takotsubo cardiomyopathy    Closed fracture of phalanx of foot 08/17/2020   Symptomatic stenosis of right carotid artery 08/08/2020   TIA (transient ischemic attack) 08/06/2020   Anemia 08/06/2020   Encounter for counseling 02/08/2020   Suppression of immune system - subtherapeutic ([place]) 07/14/2019   History of TIA (transient ischemic attack) 05/10/2019   Non-ST elevated myocardial infarction ([place]) 02/25/2019   NSTEMI (non-ST elevated myocardial infarction) ([place]) 02/25/2019   Elevated troponin level not due to acute coronary syndrome    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   On statin therapy 04/27/2018   Memory loss or impairment 38/17/7116   Complicated migraine 57/90/3833   Hyperlipidemia LDL goal <70 03/12/2018   History of RPR test, 9/18 felt to be false positive 03/12/2018   Stroke-like episode ([place]) s/p tPA 03/11/2018   Dysuria 10/14/2017   Abnormal finding on urinalysis 10/14/2017   Pharyngitis 10/14/2017   Closed nondisplaced fracture of base of fifth metacarpal bone of right hand 05/27/2017   Decreased range of motion of right ankle 05/27/2017   Pain and swelling of right wrist 05/27/2017   Age-related osteoporosis without  current pathological fracture 02/20/2017   ASCUS of cervix with negative high risk HPV 01/29/2017   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   Tobacco abuse 01/22/2017   Tobacco abuse counseling  01/22/2017   Neoplastic malignant related fatigue 01/22/2017   Vitamin D insufficiency 01/22/2017   Estrogen deficiency 01/14/2017   H/O cardiac catheterization- in prep for lung ca sx 01/06/2017   Hypothyroidism 01/06/2017   Previous back surgeries 01/06/2017   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   Chronic diastolic congestive heart failure (HCC)    Chronic obstructive pulmonary disease (HCC)    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   Antineoplastic chemotherapy induced anemia 05/05/2015   History of epistaxis 03/30/2015   Raynaud's disease 01/19/2015   Malignant neoplasm of left lung ([place]) 12/30/2014   Lung cancer ([place]) 12/30/2014   Malignant neoplasm of overlapping sites of left lung ([place]) 12/06/2014   Nonintractable episodic headache 12/06/2014   Severe mitral regurgitation 11/15/2014   Scleroderma ([place]) 10/03/2014   Asthmatic bronchitis , chronic ([place]) 10/03/2014   Multiple pulmonary nodules 10/03/2014   Current Cigarette smoker 10/03/2014    Past Medical History:  Diagnosis Date   Allergy    Asthma    pt denies this, but is on Dulera   Carotid artery disease ([place]) 2019   1-39% stenosis by Dopplers    Chronic diastolic congestive heart failure (HCC)    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[place]) 11/22/6431   Complication of anesthesia    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 [facility] (had to have botox injection)   COPD (chronic obstructive pulmonary disease) ([place])    Family history of adverse reaction to anesthesia    Mother- very sensitive to medication   Heart murmur    MVP   Hypothyroidism    Lung cancer ([place]) 12/30/2014   Synchronous primaries:  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, each treated by wedge resection with post-op adjuvant [facility]   MVP (mitral valve prolapse) 2018   s/p Minimally-Invasive Mitral Valve Replacement w/  [facility] Mitral bovine bioprosthetic tissue valve (size 57m, model # [id number],  serial # [id number]   PONV (postoperative nausea and vomiting)    Raynaud's syndrome    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   [facility] placed via right mini thoracotomy approach   Severe mitral regurgitation 11/15/2014   Shortness of breath dyspnea    with exertion   STD (sexually transmitted disease)    STEMI (ST elevation myocardial infarction) ([place] 10/24/2020   Telangiectasia     Family History  Problem Relation Age of Onset   Mitral valve prolapse Mother    Dementia Father    Prostate cancer Father    Mitral valve prolapse Sister    Mitral valve prolapse Brother    Healthy Son    Healthy Daughter    Colon cancer Neg Hx    Colon polyps Neg Hx    Rectal cancer Neg Hx    Esophageal cancer Neg Hx    Stomach cancer Neg Hx    Heart attack Neg Hx    Past Surgical History:  Procedure Laterality Date   BACK SURGERY     x 3  Disectomy   BREAST BIOPSY Left    CARDIAC CATHETERIZATION N/A 03/08/2016   Procedure: Right/Left Heart Cath and Coronary Angiography;  Surgeon:  Sherren Mocha, MD;  Location: [place] [facility];  Service: Cardiovascular;  Laterality: N/A;   CLAVICLE SURGERY Left 2013   plate to left collar bone   ENDARTERECTOMY Right 08/09/2020   Procedure: RIGHT CAROTID ENDARTERECTOMY;  Surgeon: Waynetta Sandy, MD;  Location: [place];  Service: Vascular;  Laterality: Right;   LAPAROSCOPY     ? reason-age 43    LEFT HEART CATH AND CORONARY ANGIOGRAPHY N/A 02/25/2019   Procedure: LEFT HEART CATH AND CORONARY ANGIOGRAPHY;  Surgeon: Jettie Booze, MD;  Location: [place] [facility];  Service: Cardiovascular;  Laterality: N/A;   LEFT HEART CATH AND CORONARY ANGIOGRAPHY N/A 10/24/2020   Procedure: LEFT HEART CATH AND CORONARY ANGIOGRAPHY;  Surgeon: Jettie Booze, MD;  Location: [place] [facility];  Service: Cardiovascular;  Laterality: N/A;   LUNG CANCER SURGERY      MITRAL VALVE REPAIR Right 06/27/2016   Procedure: MINIMALLY INVASIVE MITRAL VALVE REPLACEMENT;  Surgeon: Rexene Alberts, MD;  Location: [place];  Service: Open Heart Surgery;  Laterality: Right;   TEE WITHOUT CARDIOVERSION N/A 02/22/2016   Procedure: TRANSESOPHAGEAL ECHOCARDIOGRAM (TEE);  Surgeon: Dorothy Spark, MD;  Location: [place];  Service: Cardiovascular;  Laterality: N/A;   TEE WITHOUT CARDIOVERSION N/A 06/27/2016   Procedure: TRANSESOPHAGEAL ECHOCARDIOGRAM (TEE);  Surgeon: Rexene Alberts, MD;  Location: [place];  Service: Open Heart Surgery;  Laterality: N/A;   TONSILLECTOMY     VIDEO ASSISTED THORACOSCOPY (VATS)/WEDGE RESECTION Left 12/30/2014   Bronchoscopy, Mediastinoscopy, Left VATS for Wedge resection LUL x2 adn LLL x1 - Dr. Elenor Quinones at [place] Left 2017   injected with botox   wrist surgery Left 2015   plate to wrist    Social History   Social History Narrative   Not on file   Immunization History  Administered Date(s) Administered   Fluad Quad(high Dose 65+) 02/18/2019, 02/15/2021   H1N1 04/13/2008   Influenza Split 02/18/2015   Influenza, High Dose Seasonal PF 02/20/2017, 04/27/2018   Influenza,inj,Quad PF,6+ Mos 03/16/2015   Influenza-Unspecified 04/12/2008, 02/18/2011   Moderna Sars-Covid-2 Vaccination 06/21/2019, 07/18/2019, 01/25/2020   Pneumococcal Conjugate-13 01/22/2017   Tdap 09/26/2015   Zoster Recombinat (Shingrix) 05/10/2018, 03/03/2019     Objective: Vital Signs: BP 110/67 (BP Location: Left Arm, Patient Position: Sitting, Cuff Size: Normal)   Pulse 66   Ht 5' 6.5" (1.689 m)   Wt 144 lb (65.3 kg)   BMI 22.89 kg/m    Physical Exam Vitals and nursing note reviewed.  Constitutional:      Appearance: She is well-developed.  HENT:     Head: Normocephalic and atraumatic.  Eyes:     Conjunctiva/sclera: Conjunctivae normal.  Cardiovascular:     Rate and Rhythm: Normal rate and regular rhythm.     Heart sounds: Normal heart  sounds.  Pulmonary:     Effort: Pulmonary effort is normal.     Breath sounds: Normal breath sounds.  Abdominal:     General: Bowel sounds are normal.     Palpations: Abdomen is soft.  Musculoskeletal:     Cervical back: Normal range of motion.  Lymphadenopathy:     Cervical: No cervical adenopathy.  Skin:    General: Skin is warm and dry.     Capillary Refill: Capillary refill takes less than 2 seconds.     Comments: She had good capillary refill and no digital ulcers.  No nailbed capillary changes were noted.  Multiple telangiectasias were noted on her  face, neck and her bilateral hands on palmar and dorsal aspect.  Neurological:     Mental Status: She is alert and oriented to person, place, and time.  Psychiatric:        Behavior: Behavior normal.     Musculoskeletal Exam: C-spine was in good range of motion.  Shoulder joints, elbow joints, wrist joints, MCPs PIPs and DIPs with good range of motion with no synovitis.  Hip joints, knee joints, ankles, MTPs and PIPs with good range of motion with no synovitis.  CDAI Exam: CDAI Score: -- Patient Global: --; Provider Global: -- Swollen: --; Tender: -- Joint Exam 03/05/2021   No joint exam has been documented for this visit   There is currently no information documented on the homunculus. Go to the Rheumatology activity and complete the homunculus joint exam.  Investigation: No additional findings.  Imaging: DG BONE DENSITY (DXA)  Result Date: 02/27/2021 EXAM: DUAL X-RAY ABSORPTIOMETRY (DXA) FOR BONE MINERAL DENSITY IMPRESSION: Referring Physician:  Lorrene Reid Your patient completed a bone mineral density test using GE Lunar iDXA system (analysis version: 16). Technologist: Watson PATIENT: Name: Andreia, Gandolfi Patient ID: [id number] Birth Date: Aug 01, 1950 Height: 65.0 in. Sex: Female Measured: 02/27/2021 Weight: 140.2 lbs. Indications: Advanced Age, Caucasian, COPD, Estrogen Deficient, Height Loss (781.91), History of Fracture  (Adult) (V15.51), Hypothyroid, Levothyroxine, Lung Cancer, Multiple broken bones, Postmenopausal, Secondary Osteoporosis, Tobacco User (Current Smoker) Fractures: clavicle, Left wrist, Toe Treatments: Vitamin D (E933.5) ASSESSMENT: The BMD measured at Femur Neck Left is 0.726 g/cm2 with a T-score of -2.2. This patient is considered osteopenic/low bone mass according to [facility]) criteria. The quality of the exam is good. L1 was excluded due to degenerative changes. Site Region Measured Date Measured Age YA BMD Significant CHANGE T-score DualFemur Neck Left  02/27/2021    70.0         -2.2    0.726 g/cm2 DualFemur Neck Left  01/30/2017    65.9         -2.0    0.753 g/cm2 AP Spine  L2-L4      02/27/2021    70.0         -1.4    1.033 g/cm2 AP Spine  L2-L4      01/30/2017    65.9         -1.5    1.034 g/cm2 DualFemur Total Mean 02/27/2021 70.0 -2.0 0.753 g/cm2 * DualFemur Total Mean 01/30/2017    65.9         -2.4    0.707 g/cm2 World Health Organization [facility]) criteria for post-menopausal, Caucasian Women: Normal       T-score at or above -1 SD Osteopenia   T-score between -1 and -2.5 SD Osteoporosis T-score at or below -2.5 SD RECOMMENDATION: 1. All patients should optimize calcium and vitamin D intake. 2. Consider FDA-approved medical therapies in postmenopausal women and men aged 54 years and older, based on the following: a. A hip or vertebral (clinical or morphometric) fracture. b. T-score = -2.5 at the femoral neck or spine after appropriate evaluation to exclude secondary causes. c. Low bone mass (T-score between -1.0 and -2.5 at the femoral neck or spine) and a 10-year probability of a hip fracture = 3% or a 10-year probability of a major osteoporosis-related fracture = 20% based on the [place]-adapted WHO algorithm. d. Clinician judgment and/or patient preferences may indicate treatment for people with 10-year fracture probabilities above or below these levels. FOLLOW-UP: Patients with  diagnosis of  osteoporosis or at high risk for fracture should have regular bone mineral density tests.? Patients eligible for Medicare are allowed routine testing every 2 years.? The testing frequency can be increased to one year for patients who have rapidly progressing disease, are receiving or discontinuing medical therapy to restore bone mass, or have additional risk factors. I have reviewed this study and agree with the findings. [facility] FRAX* 10-year Probability of Fracture Based on femoral neck BMD: DualFemur (Left) Major Osteoporotic Fracture: 21.4% Hip Fracture:                7.5% Population:                  [place] (Caucasian) Risk Factors: History of Fracture (Adult) (V15.51), Secondary Osteoporosis, Tobacco User (Current Smoker) *FRAX is a [place] [employer] for Metabolic Bone Disease, a World Pharmacologist (WHO) [facility]. ASSESSMENT: The probability of a major osteoporotic fracture is 21.4% within the next ten years. The probability of a hip fracture is 7.5% within the next ten years. Electronically Signed   By: Rolm Baptise M.D.   On: 02/27/2021 15:48   MM 3D SCREEN BREAST BILATERAL  Result Date: 02/26/2021 CLINICAL DATA:  Screening. EXAM: DIGITAL SCREENING BILATERAL MAMMOGRAM WITH TOMOSYNTHESIS AND CAD TECHNIQUE: Bilateral screening digital craniocaudal and mediolateral oblique mammograms were obtained. Bilateral screening digital breast tomosynthesis was performed. The images were evaluated with computer-aided detection. COMPARISON:  Previous exam(s). ACR Breast Density Category b: There are scattered areas of fibroglandular density. FINDINGS: There are no findings suspicious for malignancy. IMPRESSION: No mammographic evidence of malignancy. A result letter of this screening mammogram will be mailed directly to the patient. RECOMMENDATION: Screening mammogram in one year. (Code:SM-B-01Y) BI-RADS CATEGORY  1:  Negative. Electronically Signed   By: Audie Pinto M.D.   On: 02/26/2021 14:30    Recent Labs: Lab Results  Component Value Date   WBC 6.7 11/30/2020   HGB 13.5 11/30/2020   PLT 211 11/30/2020   NA 136 11/30/2020   K 4.3 11/30/2020   CL 97 11/30/2020   CO2 24 11/30/2020   GLUCOSE 85 11/30/2020   BUN 10 11/30/2020   CREATININE 0.74 11/30/2020   BILITOT 0.3 11/30/2020   ALKPHOS 74 11/30/2020   AST 23 11/30/2020   ALT 12 11/30/2020   PROT 7.1 11/30/2020   ALBUMIN 4.6 11/30/2020   CALCIUM 9.9 11/30/2020   GFRAA 99 06/15/2020   QFTBGOLDPLUS NEGATIVE 10/12/2020    January 30, 2021 AVSE lupus index -3.0, ANA negative, ENA negative, CB CAP negative, Jo 1 negative, RF negative, anti-CCP negative, anticardiolipin negative, beta-2 GP 1 negative, thyroglobulin negative, TPO negative  Speciality Comments: No specialty comments available.  Procedures:  No procedures performed Allergies: Penicillins and Bee venom   Assessment / Plan:     Visit Diagnoses: ILD (interstitial lung disease) ([place]) - High resolution chest CT 10/18/20: Mild pulmonary fibrosis. Fibrotic findings are worsened over time on prior exam 11/16/14.  Patient was evaluated by Dr. Chase Caller she will be starting antifibrotic agent.  She is a started cardiac rehab.  Cardiology work-up was negative for pulmonary hypertension.  She will be starting pulmonary rehab soon.  Positive ANA (antinuclear antibody) - 10/12/20: ANA 1: 1280 nucleolar, nuclear, speckled , dsDNA<1, anti-CCP<16, RF<14, Scl-70-, ESR 22, aldolase 5.9, CK 129, Ro-, La-, myeloperoxidasae ab-, serine protease 3 negative, repeat ANA done on AVISE labs ANA is negative.  ENA panel was negative and [place]ice [place] 1 was  negative.  RF and anti-CCP was negative.  Lab findings were discussed with the patient.  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   I also suggested a second opinion from [facility].  She was in agreement.  I will make a referral.  Raynaud's disease without gangrene-Raynauds is not severe currently.  Raynauds symptoms could be related to smoking as well.  Keeping core temperature warm and warm clothing was discussed.  Malignant neoplasm of overlapping sites of left lung ([place]) - diagnosed in 2016 and treated at [facility]  Patient believes her telangiectasias got worse after she was diagnosed with lung cancer.  She has had telangiectasias for at least 20 years per patient.  Other medical problems are listed as follows:  Multiple pulmonary nodules  Asthmatic bronchitis , chronic (HCC)  Tobacco abuse  Age-related osteoporosis without current pathological fracture  Vitamin D insufficiency  Takotsubo cardiomyopathy  Symptomatic stenosis of right carotid artery  Chronic diastolic congestive heart failure (HCC)  NSTEMI (non-ST elevated myocardial infarction) (HCC)  Severe mitral regurgitation  Hyperlipidemia LDL goal <70  On statin therapy  TIA (transient ischemic attack)  History of hypothyroidism  HSV-1 (herpes simplex virus 1) infection  HSV-2 (herpes simplex virus 2) infection  History of RPR test, 9/18 felt to be false positive  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   Follow-Up Instructions: Return in about 6 months (around 09/03/2021) for ILD, +ANA.   Bo Merino, MD  Note - This record has been created using Editor, commissioning.  Chart creation errors have been sought, but may not always  have been located. Such creation errors do not reflect on  the standard of medical care.

## 2021-02-26 ENCOUNTER — Encounter (HOSPITAL_COMMUNITY)
Admission: RE | Admit: 2021-02-26 | Discharge: 2021-02-26 | Disposition: A | Discharge status: Home / Self Care | Payer: Medicare Other | Source: Ambulatory Visit | Attending: Cardiology | Admitting: Cardiology

## 2021-02-26 ENCOUNTER — Other Ambulatory Visit: Payer: Self-pay

## 2021-02-26 ENCOUNTER — Other Ambulatory Visit (HOSPITAL_COMMUNITY): Payer: Self-pay

## 2021-02-26 DIAGNOSIS — I5181 Takotsubo syndrome: Secondary | ICD-10-CM | POA: Diagnosis not present

## 2021-02-26 DIAGNOSIS — Z5189 Encounter for other specified aftercare: Secondary | ICD-10-CM | POA: Diagnosis not present

## 2021-02-26 DIAGNOSIS — I252 Old myocardial infarction: Secondary | ICD-10-CM | POA: Diagnosis not present

## 2021-02-26 NOTE — Telephone Encounter (Signed)
Unable to process test claim as patient must fill through [facility]. Pharmacy helpdesk not able to provide estimated copay. Advised patient will need to contact plan for pricing.

## 2021-02-27 ENCOUNTER — Ambulatory Visit
Admission: RE | Admit: 2021-02-27 | Discharge: 2021-02-27 | Disposition: A | Discharge status: Home / Self Care | Payer: Medicare Other | Source: Ambulatory Visit | Attending: Physician Assistant | Admitting: Physician Assistant

## 2021-02-27 DIAGNOSIS — M8589 Other specified disorders of bone density and structure, multiple sites: Secondary | ICD-10-CM | POA: Diagnosis not present

## 2021-02-27 DIAGNOSIS — Z780 Asymptomatic menopausal state: Secondary | ICD-10-CM

## 2021-02-27 NOTE — Progress Notes (Signed)
Cardiac Individual Treatment Plan  Patient Details  Name: Joy Williams MRN: [id number] Date of Birth: Sep 22, 1950 Referring Provider:   Flowsheet Row CARDIAC REHAB PHASE II ORIENTATION from 02/13/2021 in [facility]  Referring Provider Gwyndolyn Kaufman, MD       Initial Encounter Date:  [place] PHASE II ORIENTATION from 02/13/2021 in [facility]  Date 02/13/21       Visit Diagnosis: Takotsubo cardiomyopathy  Patient's Home Medications on Admission:  Current Outpatient Medications:    acetaminophen (TYLENOL) 500 MG tablet, Take 1,000 mg by mouth every 6 (six) hours as needed for mild pain or headache., Disp: , Rfl:    Cholecalciferol (VITAMIN D) 125 MCG (5000 UT) CAPS, Take 5,000 Units by mouth daily., Disp: , Rfl:    clopidogrel (PLAVIX) 75 MG tablet, Take 1 tablet (75 mg total) by mouth daily., Disp: 90 tablet, Rfl: 3   EPINEPHrine (EPIPEN 2-PAK) 0.3 mg/0.3 mL IJ SOAJ injection, Inject 0.3 mg into the muscle as needed for anaphylaxis., Disp: 1 each, Rfl: 1   ferrous sulfate 325 (65 FE) MG tablet, Take 1 tablet (325 mg total) by mouth daily., Disp: 30 tablet, Rfl: 6   hydrOXYzine (ATARAX/VISTARIL) 25 MG tablet, TAKE 1/2 TO 1 TABLET BY MOUTH TWICE DAILY AS NEEDED FOR ITCHING, Disp: 45 tablet, Rfl: 1   levothyroxine (SYNTHROID) 175 MCG tablet, TAKE 1 TABLET BY MOUTH EVERY DAY BEFORE BREAKFAST, Disp: 90 tablet, Rfl: 1   MELATONIN PO, Take 1 tablet by mouth at bedtime as needed (sleep)., Disp: , Rfl:    Multiple Vitamins-Minerals (MULTIVITAMIN WITH MINERALS) tablet, Take 1 tablet by mouth daily., Disp: , Rfl:    nicotine (NICODERM CQ - DOSED IN MG/24 HR) 7 mg/24hr patch, Place 1 patch (7 mg total) onto the skin daily. (Patient not taking: Reported on 02/15/2021), Disp: 28 patch, Rfl: 0   rosuvastatin (CRESTOR) 20 MG tablet, Take 1 tablet (20 mg total) by mouth daily. (Patient taking differently: Take 20 mg  by mouth every evening.), Disp: 90 tablet, Rfl: 0   Tiotropium Bromide Monohydrate (SPIRIVA RESPIMAT) 1.25 MCG/ACT AERS, Inhale 2 puffs into the lungs daily., Disp: 4 g, Rfl: 0   Turmeric 500 MG CAPS, Take 500 mg by mouth daily., Disp: , Rfl:    vitamin B-12 (CYANOCOBALAMIN) 1000 MCG tablet, Take 1 tablet (1,000 mcg total) by mouth daily., Disp: , Rfl:   Past Medical History: Past Medical History:  Diagnosis Date   Allergy    Asthma    pt denies this, but is on Dulera   Carotid artery disease ([place]) 2019   1-39% stenosis by Dopplers    Chronic diastolic congestive heart failure (HCC)    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   Complication of anesthesia    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 [facility] (had to have botox injection)   COPD (chronic obstructive pulmonary disease) ([place])    Family history of adverse reaction to anesthesia    Mother- very sensitive to medication   Heart murmur    MVP   Hypothyroidism    Lung cancer ([place]) 12/30/2014   Synchronous primaries:  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, each treated by wedge resection with post-op adjuvant chemoRx at [facility]   MVP (mitral valve prolapse) 2018   s/p Minimally-Invasive Mitral Valve Replacement w/  [facility] Mitral bovine bioprosthetic tissue valve (size 75mm, model # 7300TFX, serial # [id number])   PONV (  postoperative nausea and vomiting)    Raynaud's syndrome    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   31 mm [facility] mitral bovine bioprosthetic tissue valve placed via right mini thoracotomy approach   Severe mitral regurgitation 11/15/2014   Shortness of breath dyspnea    with exertion   STD (sexually transmitted disease)    STEMI (ST elevation myocardial infarction) ([place]) 10/24/2020   Telangiectasia     Tobacco Use: Social History   Tobacco Use  Smoking Status Every Day   Packs/day: 0.75   Years: 24.00   Pack years: 18.00   Types: E-cigarettes,  Cigarettes   Start date: 05/21/1983  Smokeless Tobacco Never  Tobacco Comments   Currently smoking 3cigs per day as of 02/15/21    Labs: Recent Review Flowsheet Data     Labs for ITP Cardiac and Pulmonary Rehab Latest Ref Rng & Units 11/11/2018 02/25/2019 06/15/2020 08/07/2020 10/24/2020   Cholestrol 0 - 200 mg/dL 150 133 - 116 135   LDLCALC 0 - 99 mg/dL 64 57 - 50 55   LDLDIRECT 0 - 99 mg/dL 68 - 63 - -   HDL >40 mg/dL 70 61 - 46 60   Trlycerides <150 mg/dL 78 74 - 102 100   Hemoglobin A1c 4.8 - 5.6 % - - - 5.6 5.5   PHART 7.350 - 7.450 - - - - -   PCO2ART 32.0 - 48.0 mmHg - - - - -   HCO3 20.0 - 28.0 mmol/L - - - - -   TCO2 22 - 32 mmol/L - - - - 24   ACIDBASEDEF 0.0 - 2.0 mmol/L - - - - -   O2SAT % - - - - -       Capillary Blood Glucose: Lab Results  Component Value Date   GLUCAP 94 11/03/2020   GLUCAP 85 11/03/2020   GLUCAP 81 03/11/2018   GLUCAP 86 07/03/2016   GLUCAP 108 (H) 07/02/2016     Exercise Target Goals: Exercise Program Goal: Individual exercise prescription set using results from initial 6 min walk test and THRR while considering  patient's activity barriers and safety.   Exercise Prescription Goal: Initial exercise prescription builds to 30-45 minutes a day of aerobic activity, 2-3 days per week.  Home exercise guidelines will be given to patient during program as part of exercise prescription that the participant will acknowledge.  Activity Barriers & Risk Stratification:  Activity Barriers & Cardiac Risk Stratification - 02/13/21 1028       Activity Barriers & Cardiac Risk Stratification   Activity Barriers Arthritis;Back Problems;Shortness of Breath;Deconditioning;Decreased Ventricular Function;Balance Concerns;Other (comment)    Comments 3 back surgeries ( herniated disc L4-L5 and L3-L4),    Cardiac Risk Stratification High             6 Minute Walk:  6 Minute Walk     Row Name 02/13/21 0853         6 Minute Walk   Phase Initial      Distance 1450 feet     Walk Time 6 minutes     # of Rest Breaks 0     MPH 2.75     METS 3.35     RPE 11     Perceived Dyspnea  0     VO2 Peak 11.72     Symptoms Yes (comment)     Comments Bilateral Hip pain 2/10     Resting HR 88 bpm  Resting BP 126/82     Resting Oxygen Saturation  98 %     Exercise Oxygen Saturation  during 6 min walk 98 %     Max Ex. HR 94 bpm     Max Ex. BP 134/65     2 Minute Post BP 126/76              Oxygen Initial Assessment:   Oxygen Re-Evaluation:   Oxygen Discharge (Final Oxygen Re-Evaluation):   Initial Exercise Prescription:  Initial Exercise Prescription - 02/13/21 1100       Date of Initial Exercise RX and Referring Provider   Date 02/13/21    Referring Provider Gwyndolyn Kaufman, MD    Expected Discharge Date 02/09/21      NuStep   Level 2    SPM 85    Minutes 15    METs 2.2      Track   Laps 13    Minutes 15    METs 2.51      Prescription Details   Frequency (times per week) 3    Duration Progress to 30 minutes of continuous aerobic without signs/symptoms of physical distress      Intensity   THRR 40-80% of Max Heartrate 60-121    Ratings of Perceived Exertion 11-13    Perceived Dyspnea 0-4      Progression   Progression Continue progressive overload as per policy without signs/symptoms or physical distress.      Resistance Training   Training Prescription Yes    Weight 2 lbs    Reps 10-15             Perform Capillary Blood Glucose checks as needed.  Exercise Prescription Changes:   Exercise Prescription Changes     Row Name 02/19/21 1200             Response to Exercise   Blood Pressure (Admit) 124/72       Blood Pressure (Exercise) 122/82       Blood Pressure (Exit) 105/76       Heart Rate (Admit) 70 bpm       Heart Rate (Exercise) 95 bpm       Heart Rate (Exit) 76 bpm       Oxygen Saturation (Admit) 99 %       Oxygen Saturation (Exercise) 98 %       Oxygen Saturation (Exit) 98 %        Rating of Perceived Exertion (Exercise) 11       Perceived Dyspnea (Exercise) 0       Symptoms 2 bouts of lightheadedness: nustep and weights.       Comments Pt's first day in hte CRP2 program       Duration Progress to 30 minutes of  aerobic without signs/symptoms of physical distress       Intensity THRR unchanged               Progression   Progression Continue to progress workloads to maintain intensity without signs/symptoms of physical distress.       Average METs 2.3               Resistance Training   Training Prescription Yes       Weight 2 lbs       Reps 10-15       Time 10 Minutes               Interval Training   Interval Training No  NuStep   Level 2       SPM 80       Minutes 15       METs 1.9               Track   Laps 15       Minutes 15       METs 2.74                Exercise Comments:   Exercise Comments     Row Name 02/19/21 1222           Exercise Comments Pt's first day in the CRP2 program, Pt experienced 2 episodes of lightheadedness, one after the nustep and one after the weights. Pt voices that she gets lightheaded  at home. May need to adjust exercise workloads next session.                Exercise Goals and Review:   Exercise Goals     Row Name 02/13/21 1031             Exercise Goals   Increase Physical Activity Yes       Intervention Provide advice, education, support and counseling about physical activity/exercise needs.;Develop an individualized exercise prescription for aerobic and resistive training based on initial evaluation findings, risk stratification, comorbidities and participant's personal goals.       Expected Outcomes Short Term: Attend rehab on a regular basis to increase amount of physical activity.;Long Term: Add in home exercise to make exercise part of routine and to increase amount of physical activity.;Long Term: Exercising regularly at least 3-5 days a week.       Increase  Strength and Stamina Yes       Intervention Provide advice, education, support and counseling about physical activity/exercise needs.;Develop an individualized exercise prescription for aerobic and resistive training based on initial evaluation findings, risk stratification, comorbidities and participant's personal goals.       Expected Outcomes Short Term: Perform resistance training exercises routinely during rehab and add in resistance training at home;Short Term: Increase workloads from initial exercise prescription for resistance, speed, and METs.;Long Term: Improve cardiorespiratory fitness, muscular endurance and strength as measured by increased METs and functional capacity (6MWT)       Able to understand and use rate of perceived exertion (RPE) scale Yes       Intervention Provide education and explanation on how to use RPE scale       Expected Outcomes Short Term: Able to use RPE daily in rehab to express subjective intensity level;Long Term:  Able to use RPE to guide intensity level when exercising independently       Knowledge and understanding of Target Heart Rate Range (THRR) Yes       Intervention Provide education and explanation of THRR including how the numbers were predicted and where they are located for reference       Expected Outcomes Short Term: Able to state/look up THRR;Long Term: Able to use THRR to govern intensity when exercising independently;Short Term: Able to use daily as guideline for intensity in rehab       Understanding of Exercise Prescription Yes       Intervention Provide education, explanation, and written materials on patient's individual exercise prescription       Expected Outcomes Short Term: Able to explain program exercise prescription;Long Term: Able to explain home exercise prescription to exercise independently  Exercise Goals Re-Evaluation :  Exercise Goals Re-Evaluation     [place] Name 02/19/21 1221             Exercise Goal  Re-Evaluation   Exercise Goals Review Increase Physical Activity;Increase Strength and Stamina;Knowledge and understanding of Target Heart Rate Range (THRR);Understanding of Exercise Prescription       Comments Pt's first day in the the CRP2 program. Pt understnads the exercise Rx, THRR and RPE scale.       Expected Outcomes Will continue to montior patient and adjust exercise workloads as tolerated                Discharge Exercise Prescription (Final Exercise Prescription Changes):  Exercise Prescription Changes - 02/19/21 1200       Response to Exercise   Blood Pressure (Admit) 124/72    Blood Pressure (Exercise) 122/82    Blood Pressure (Exit) 105/76    Heart Rate (Admit) 70 bpm    Heart Rate (Exercise) 95 bpm    Heart Rate (Exit) 76 bpm    Oxygen Saturation (Admit) 99 %    Oxygen Saturation (Exercise) 98 %    Oxygen Saturation (Exit) 98 %    Rating of Perceived Exertion (Exercise) 11    Perceived Dyspnea (Exercise) 0    Symptoms 2 bouts of lightheadedness: nustep and weights.    Comments Pt's first day in hte CRP2 program    Duration Progress to 30 minutes of  aerobic without signs/symptoms of physical distress    Intensity THRR unchanged      Progression   Progression Continue to progress workloads to maintain intensity without signs/symptoms of physical distress.    Average METs 2.3      Resistance Training   Training Prescription Yes    Weight 2 lbs    Reps 10-15    Time 10 Minutes      Interval Training   Interval Training No      NuStep   Level 2    SPM 80    Minutes 15    METs 1.9      Track   Laps 15    Minutes 15    METs 2.74             Nutrition:  Target Goals: Understanding of nutrition guidelines, daily intake of sodium 1500mg , cholesterol 200mg , calories 30% from fat and 7% or less from saturated fats, daily to have 5 or more servings of fruits and vegetables.  Biometrics:  Pre Biometrics - 02/13/21 0957       Pre Biometrics    Waist Circumference 37 inches    Hip Circumference 39 inches    Waist to Hip Ratio 0.95 %    Triceps Skinfold 23 mm    % Body Fat 36.8 %    Grip Strength 24 kg    Flexibility 12.25 in    Single Leg Stand 5.56 seconds              Nutrition Therapy Plan and Nutrition Goals:   Nutrition Assessments:  MEDIFICTS Score Key: ?70 Need to make dietary changes  40-70 Heart Healthy Diet ? 40 Therapeutic Level Cholesterol Diet    Picture Your Plate Scores: <92 Unhealthy dietary pattern with much room for improvement. 41-50 Dietary pattern unlikely to meet recommendations for good health and room for improvement. 51-60 More healthful dietary pattern, with some room for improvement.  >60 Healthy dietary pattern, although there may be some specific behaviors that could be improved.  Nutrition Goals Re-Evaluation:   Nutrition Goals Re-Evaluation:   Nutrition Goals Discharge (Final Nutrition Goals Re-Evaluation):   Psychosocial: Target Goals: Acknowledge presence or absence of significant depression and/or stress, maximize coping skills, provide positive support system. Participant is able to verbalize types and ability to use techniques and skills needed for reducing stress and depression.  Initial Review & Psychosocial Screening:  Initial Psych Review & Screening - 02/13/21 0846       Initial Review   Current issues with None Identified      Family Dynamics   Good Support System? Yes   Joy Williams lives alone. Joy Williams has her children and grandchildren who live in the area for support     Barriers   Psychosocial barriers to participate in program There are no identifiable barriers or psychosocial needs.      Screening Interventions   Interventions Encouraged to exercise             Quality of Life Scores:  Quality of Life - 02/13/21 1002       Quality of Life   Select Quality of Life      Quality of Life Scores   Health/Function Pre 20.97 %    Socioeconomic  Pre 22.8 %    Psych/Spiritual Pre 24.86 %    Family Pre 24.9 %    GLOBAL Pre 22.56 %            Scores of 19 and below usually indicate a poorer quality of life in these areas.  A difference of  2-3 points is a clinically meaningful difference.  A difference of 2-3 points in the total score of the Quality of Life Index has been associated with significant improvement in overall quality of life, self-image, physical symptoms, and general health in studies assessing change in quality of life.  PHQ-9: Recent Review Flowsheet Data     Depression screen [facility] 2/9 02/13/2021 12/19/2020 11/30/2020 08/17/2020 06/15/2020   Decreased Interest 0 0 0 0 0   Down, Depressed, Hopeless 0 0 0 0 0   PHQ - 2 Score 0 0 0 0 0   Altered sleeping - 1 0 0 0   Tired, decreased energy - 0 0 0 0   Change in appetite - 0 0 0 0   Feeling bad or failure about yourself  - 0 0 0 0   Trouble concentrating - 0 0 0 0   Moving slowly or fidgety/restless - 0 0 0 0   Suicidal thoughts - 0 0 0 0   PHQ-9 Score - 1 0 0 0   Difficult doing work/chores - Somewhat difficult - - -      Interpretation of Total Score  Total Score Depression Severity:  1-4 = Minimal depression, 5-9 = Mild depression, 10-14 = Moderate depression, 15-19 = Moderately severe depression, 20-27 = Severe depression   Psychosocial Evaluation and Intervention:   Psychosocial Re-Evaluation:  Psychosocial Re-Evaluation     Row Name 02/19/21 1542             Psychosocial Re-Evaluation   Current issues with None Identified       Interventions Encouraged to attend Cardiac Rehabilitation for the exercise       Continue Psychosocial Services  No Follow up required                Psychosocial Discharge (Final Psychosocial Re-Evaluation):  Psychosocial Re-Evaluation - 02/19/21 1542       Psychosocial Re-Evaluation   Current issues with  None Identified    Interventions Encouraged to attend Cardiac Rehabilitation for the exercise    Continue  Psychosocial Services  No Follow up required             Vocational Rehabilitation: Provide vocational rehab assistance to qualifying candidates.   Vocational Rehab Evaluation & Intervention:  Vocational Rehab - 02/13/21 0847       Initial Vocational Rehab Evaluation & Intervention   Assessment shows need for Vocational Rehabilitation No   Joy Williams is retired and does not not need vocational rehab at this time.            Education: Education Goals: Education classes will be provided on a weekly basis, covering required topics. Participant will state understanding/return demonstration of topics presented.  Learning Barriers/Preferences:  Learning Barriers/Preferences - 02/13/21 1003       Learning Barriers/Preferences   Learning Barriers Hearing;Sight   Pt wears glasses: bilateral hearing aids   Learning Preferences Audio;Computer/Internet;Group Instruction;Individual Instruction;Pictoral;Skilled Demonstration;Verbal Instruction;Video;Written Material             Education Topics: Count Your Pulse:  -Group instruction provided by verbal instruction, demonstration, patient participation and written materials to support subject.  Instructors address importance of being able to find your pulse and how to count your pulse when at home without a heart monitor.  Patients get hands on experience counting their pulse with staff help and individually.   Heart Attack, Angina, and Risk Factor Modification:  -Group instruction provided by verbal instruction, video, and written materials to support subject.  Instructors address signs and symptoms of angina and heart attacks.    Also discuss risk factors for heart disease and how to make changes to improve heart health risk factors.   Functional Fitness:  -Group instruction provided by verbal instruction, demonstration, patient participation, and written materials to support subject.  Instructors address safety measures for doing  things around the house.  Discuss how to get up and down off the floor, how to pick things up properly, how to safely get out of a chair without assistance, and balance training.   Meditation and Mindfulness:  -Group instruction provided by verbal instruction, patient participation, and written materials to support subject.  Instructor addresses importance of mindfulness and meditation practice to help reduce stress and improve awareness.  Instructor also leads participants through a meditation exercise.    Stretching for Flexibility and Mobility:  -Group instruction provided by verbal instruction, patient participation, and written materials to support subject.  Instructors lead participants through series of stretches that are designed to increase flexibility thus improving mobility.  These stretches are additional exercise for major muscle groups that are typically performed during regular warm up and cool down.   Hands Only CPR:  -Group verbal, video, and participation provides a basic overview of AHA guidelines for community CPR. Role-play of emergencies allow participants the opportunity to practice calling for help and chest compression technique with discussion of AED use.   Hypertension: -Group verbal and written instruction that provides a basic overview of hypertension including the most recent diagnostic guidelines, risk factor reduction with self-care instructions and medication management.    Nutrition I class: Heart Healthy Eating:  -Group instruction provided by PowerPoint slides, verbal discussion, and written materials to support subject matter. The instructor gives an explanation and review of the Therapeutic Lifestyle Changes diet recommendations, which includes a discussion on lipid goals, dietary fat, sodium, fiber, plant stanol/sterol esters, sugar, and the components of a well-balanced, healthy diet.  Nutrition II class: Lifestyle Skills:  -Group instruction provided  by PowerPoint slides, verbal discussion, and written materials to support subject matter. The instructor gives an explanation and review of label reading, grocery shopping for heart health, heart healthy recipe modifications, and ways to make healthier choices when eating out.   Diabetes Question & Answer:  -Group instruction provided by PowerPoint slides, verbal discussion, and written materials to support subject matter. The instructor gives an explanation and review of diabetes co-morbidities, pre- and post-prandial blood glucose goals, pre-exercise blood glucose goals, signs, symptoms, and treatment of hypoglycemia and hyperglycemia, and foot care basics.   Diabetes Blitz:  -Group instruction provided by PowerPoint slides, verbal discussion, and written materials to support subject matter. The instructor gives an explanation and review of the physiology behind type 1 and type 2 diabetes, diabetes medications and rational behind using different medications, pre- and post-prandial blood glucose recommendations and Hemoglobin A1c goals, diabetes diet, and exercise including blood glucose guidelines for exercising safely.    Portion Distortion:  -Group instruction provided by PowerPoint slides, verbal discussion, written materials, and food models to support subject matter. The instructor gives an explanation of serving size versus portion size, changes in portions sizes over the last 20 years, and what consists of a serving from each food group.   Stress Management:  -Group instruction provided by verbal instruction, video, and written materials to support subject matter.  Instructors review role of stress in heart disease and how to cope with stress positively.     Exercising on Your Own:  -Group instruction provided by verbal instruction, power point, and written materials to support subject.  Instructors discuss benefits of exercise, components of exercise, frequency and intensity of  exercise, and end points for exercise.  Also discuss use of nitroglycerin and activating EMS.  Review options of places to exercise outside of rehab.  Review guidelines for sex with heart disease.   Cardiac Drugs I:  -Group instruction provided by verbal instruction and written materials to support subject.  Instructor reviews cardiac drug classes: antiplatelets, anticoagulants, beta blockers, and statins.  Instructor discusses reasons, side effects, and lifestyle considerations for each drug class.   Cardiac Drugs II:  -Group instruction provided by verbal instruction and written materials to support subject.  Instructor reviews cardiac drug classes: angiotensin converting enzyme inhibitors (ACE-I), angiotensin II receptor blockers (ARBs), nitrates, and calcium channel blockers.  Instructor discusses reasons, side effects, and lifestyle considerations for each drug class.   Anatomy and Physiology of the Circulatory System:  Group verbal and written instruction and models provide basic cardiac anatomy and physiology, with the coronary electrical and arterial systems. Review of: AMI, Angina, Valve disease, Heart Failure, Peripheral Artery Disease, Cardiac Arrhythmia, Pacemakers, and the ICD.   Other Education:  -Group or individual verbal, written, or video instructions that support the educational goals of the cardiac rehab program.   Holiday Eating Survival Tips:  -Group instruction provided by PowerPoint slides, verbal discussion, and written materials to support subject matter. The instructor gives patients tips, tricks, and techniques to help them not only survive but enjoy the holidays despite the onslaught of food that accompanies the holidays.   Knowledge Questionnaire Score:  Knowledge Questionnaire Score - 02/13/21 1003       Knowledge Questionnaire Score   Pre Score 22/28             Core Components/Risk Factors/Patient Goals at Admission:  Personal Goals and Risk  Factors at Admission - 02/13/21 1004  Core Components/Risk Factors/Patient Goals on Admission    Weight Management Weight Maintenance    Tobacco Cessation Yes    Number of packs per day 2-3 cigarettes per day    Intervention Assist the participant in steps to quit. Provide individualized education and counseling about committing to Tobacco Cessation, relapse prevention, and pharmacological support that can be provided by physician.;Advice worker, assist with locating and accessing local/national Quit Smoking programs, and support quit date choice.    Expected Outcomes Short Term: Will demonstrate readiness to quit, by selecting a quit date.;Short Term: Will quit all tobacco product use, adhering to prevention of relapse plan.;Long Term: Complete abstinence from all tobacco products for at least 12 months from quit date.    Improve shortness of breath with ADL's Yes    Intervention Provide education, individualized exercise plan and daily activity instruction to help decrease symptoms of SOB with activities of daily living.    Expected Outcomes Short Term: Improve cardiorespiratory fitness to achieve a reduction of symptoms when performing ADLs    Heart Failure Yes    Intervention Provide a combined exercise and nutrition program that is supplemented with education, support and counseling about heart failure. Directed toward relieving symptoms such as shortness of breath, decreased exercise tolerance, and extremity edema.    Expected Outcomes Improve functional capacity of life;Short term: Attendance in program 2-3 days a week with increased exercise capacity. Reported lower sodium intake. Reported increased fruit and vegetable intake. Reports medication compliance.;Short term: Daily weights obtained and reported for increase. Utilizing diuretic protocols set by physician.;Long term: Adoption of self-care skills and reduction of barriers for early signs and symptoms recognition and  intervention leading to self-care maintenance.    Hypertension Yes    Intervention Provide education on lifestyle modifcations including regular physical activity/exercise, weight management, moderate sodium restriction and increased consumption of fresh fruit, vegetables, and low fat dairy, alcohol moderation, and smoking cessation.;Monitor prescription use compliance.    Expected Outcomes Short Term: Continued assessment and intervention until BP is < 140/10mm HG in hypertensive participants. < 130/63mm HG in hypertensive participants with diabetes, heart failure or chronic kidney disease.;Long Term: Maintenance of blood pressure at goal levels.    Lipids Yes    Intervention Provide education and support for participant on nutrition & aerobic/resistive exercise along with prescribed medications to achieve LDL 70mg , HDL >40mg .    Expected Outcomes Short Term: Participant states understanding of desired cholesterol values and is compliant with medications prescribed. Participant is following exercise prescription and nutrition guidelines.;Long Term: Cholesterol controlled with medications as prescribed, with individualized exercise RX and with personalized nutrition plan. Value goals: LDL < 70mg , HDL > 40 mg.             Core Components/Risk Factors/Patient Goals Review:   Goals and Risk Factor Review     Row Name 02/19/21 1545             Core Components/Risk Factors/Patient Goals Review   Personal Goals Review Weight Management/Obesity;Heart Failure;Hypertension;Lipids;Tobacco Cessation       Review Joy Williams started exercise at cardiac rehab on 02/19/21 and did fair with exercise. Joy Williams did experience some orthostatic hypotenson. the PA was notfied. Enc to hydrate properly       Expected Outcomes Joy Williams will continue to partcipate in phase 2 cardiac rehab for exercise nutrition and lifestyle modifications.                Core Components/Risk Factors/Patient Goals at Discharge  (Final Review):  Goals and Risk Factor Review - 02/19/21 1545       Core Components/Risk Factors/Patient Goals Review   Personal Goals Review Weight Management/Obesity;Heart Failure;Hypertension;Lipids;Tobacco Cessation    Review Joy Williams started exercise at cardiac rehab on 02/19/21 and did fair with exercise. Joy Williams did experience some orthostatic hypotenson. the PA was notfied. Enc to hydrate properly    Expected Outcomes Joy Williams will continue to partcipate in phase 2 cardiac rehab for exercise nutrition and lifestyle modifications.             ITP Comments:  ITP Comments     Row Name 02/13/21 0845 02/19/21 1538         ITP Comments Dr Fransico Him MD, Medical Director 30 Day ITP Review. Joy Williams started exercise on 02/19/21 and did fair with exercise. Joy Williams did report some dizziness.               Comments: See ITP comments

## 2021-02-28 ENCOUNTER — Encounter (HOSPITAL_COMMUNITY): Payer: Medicare Other

## 2021-02-28 NOTE — Telephone Encounter (Signed)
Called patient and reviewed that we are unable to run test claim but anticipate her copay being unaffordable.  Submitted Patient Assistance Application to [facility] for [place] along with provider portion, patient portion, PA and income documents. Will update patient when we receive a response.  Fax# [phone] Phone#  [phone]  Knox Saliva, PharmD, MPH, BCPS Clinical Pharmacist (Rheumatology and Pulmonology)

## 2021-03-02 ENCOUNTER — Other Ambulatory Visit: Payer: Self-pay

## 2021-03-02 ENCOUNTER — Encounter (HOSPITAL_COMMUNITY)
Admission: RE | Admit: 2021-03-02 | Discharge: 2021-03-02 | Disposition: A | Discharge status: Home / Self Care | Payer: Medicare Other | Source: Ambulatory Visit | Attending: Cardiology | Admitting: Cardiology

## 2021-03-02 DIAGNOSIS — I214 Non-ST elevation (NSTEMI) myocardial infarction: Secondary | ICD-10-CM

## 2021-03-02 DIAGNOSIS — I5181 Takotsubo syndrome: Secondary | ICD-10-CM

## 2021-03-02 DIAGNOSIS — Z5189 Encounter for other specified aftercare: Secondary | ICD-10-CM | POA: Diagnosis not present

## 2021-03-02 DIAGNOSIS — I252 Old myocardial infarction: Secondary | ICD-10-CM | POA: Diagnosis not present

## 2021-03-05 ENCOUNTER — Ambulatory Visit (INDEPENDENT_AMBULATORY_CARE_PROVIDER_SITE_OTHER): Payer: Medicare Other | Admitting: Rheumatology

## 2021-03-05 ENCOUNTER — Encounter (HOSPITAL_COMMUNITY)
Admission: RE | Admit: 2021-03-05 | Discharge: 2021-03-05 | Disposition: A | Discharge status: Home / Self Care | Payer: Medicare Other | Source: Ambulatory Visit | Attending: Cardiology | Admitting: Cardiology

## 2021-03-05 ENCOUNTER — Encounter: Payer: Self-pay | Admitting: Rheumatology

## 2021-03-05 ENCOUNTER — Other Ambulatory Visit: Payer: Self-pay

## 2021-03-05 VITALS — BP 110/67 | HR 66 | Ht 66.5 in | Wt 144.0 lb

## 2021-03-05 DIAGNOSIS — Z8639 Personal history of other endocrine, nutritional and metabolic disease: Secondary | ICD-10-CM

## 2021-03-05 DIAGNOSIS — R768 Other specified abnormal immunological findings in serum: Secondary | ICD-10-CM | POA: Diagnosis not present

## 2021-03-05 DIAGNOSIS — M810 Age-related osteoporosis without current pathological fracture: Secondary | ICD-10-CM

## 2021-03-05 DIAGNOSIS — E559 Vitamin D deficiency, unspecified: Secondary | ICD-10-CM

## 2021-03-05 DIAGNOSIS — I6523 Occlusion and stenosis of bilateral carotid arteries: Secondary | ICD-10-CM

## 2021-03-05 DIAGNOSIS — I5181 Takotsubo syndrome: Secondary | ICD-10-CM

## 2021-03-05 DIAGNOSIS — J849 Interstitial pulmonary disease, unspecified: Secondary | ICD-10-CM

## 2021-03-05 DIAGNOSIS — R918 Other nonspecific abnormal finding of lung field: Secondary | ICD-10-CM | POA: Diagnosis not present

## 2021-03-05 DIAGNOSIS — I6521 Occlusion and stenosis of right carotid artery: Secondary | ICD-10-CM

## 2021-03-05 DIAGNOSIS — Z5189 Encounter for other specified aftercare: Secondary | ICD-10-CM | POA: Diagnosis not present

## 2021-03-05 DIAGNOSIS — C3482 Malignant neoplasm of overlapping sites of left bronchus and lung: Secondary | ICD-10-CM | POA: Diagnosis not present

## 2021-03-05 DIAGNOSIS — I5032 Chronic diastolic (congestive) heart failure: Secondary | ICD-10-CM | POA: Diagnosis not present

## 2021-03-05 DIAGNOSIS — I252 Old myocardial infarction: Secondary | ICD-10-CM | POA: Diagnosis not present

## 2021-03-05 DIAGNOSIS — J449 Chronic obstructive pulmonary disease, unspecified: Secondary | ICD-10-CM

## 2021-03-05 DIAGNOSIS — I7300 Raynaud's syndrome without gangrene: Secondary | ICD-10-CM

## 2021-03-05 DIAGNOSIS — I340 Nonrheumatic mitral (valve) insufficiency: Secondary | ICD-10-CM

## 2021-03-05 DIAGNOSIS — Z9289 Personal history of other medical treatment: Secondary | ICD-10-CM

## 2021-03-05 DIAGNOSIS — G459 Transient cerebral ischemic attack, unspecified: Secondary | ICD-10-CM

## 2021-03-05 DIAGNOSIS — D5912 Cold autoimmune hemolytic anemia: Secondary | ICD-10-CM

## 2021-03-05 DIAGNOSIS — Z720 Tobacco use: Secondary | ICD-10-CM

## 2021-03-05 DIAGNOSIS — I214 Non-ST elevation (NSTEMI) myocardial infarction: Secondary | ICD-10-CM

## 2021-03-05 DIAGNOSIS — B009 Herpesviral infection, unspecified: Secondary | ICD-10-CM

## 2021-03-05 DIAGNOSIS — R87610 Atypical squamous cells of undetermined significance on cytologic smear of cervix (ASC-US): Secondary | ICD-10-CM

## 2021-03-05 DIAGNOSIS — Z79899 Other long term (current) drug therapy: Secondary | ICD-10-CM

## 2021-03-05 DIAGNOSIS — E785 Hyperlipidemia, unspecified: Secondary | ICD-10-CM

## 2021-03-06 ENCOUNTER — Encounter: Payer: Self-pay | Admitting: Physician Assistant

## 2021-03-06 ENCOUNTER — Ambulatory Visit (INDEPENDENT_AMBULATORY_CARE_PROVIDER_SITE_OTHER): Payer: Medicare Other | Admitting: Physician Assistant

## 2021-03-06 VITALS — Ht 66.5 in | Wt 144.0 lb

## 2021-03-06 DIAGNOSIS — M8580 Other specified disorders of bone density and structure, unspecified site: Secondary | ICD-10-CM

## 2021-03-06 NOTE — Progress Notes (Signed)
Telehealth office visit note for Joy Reid, PA-C- at Primary Care at [facility]   I connected with current patient today by telephone and verified that I am speaking with the correct person    Location of the patient: Home  Location of the provider: Office - This visit type was conducted due to national recommendations for restrictions regarding the COVID-19 Pandemic (e.g. social distancing) in an effort to limit this patient's exposure and mitigate transmission in our community.    - No physical exam could be performed with this format, beyond that communicated to [place] by the patient/ family members as noted.   - Additionally my office staff/ schedulers were to discuss with the patient that there may be a monetary charge related to this service, depending on their medical insurance.  My understanding is that patient understood and consented to proceed.     _________________________________________________________________________________   History of Present Illness: Patient calls in to discuss bone density results and treatment options.  Reports takes a multivitamin which has 220 mg of calcium and vitamin D supplement 125 MCG.  Patient is in the process of starting medication for interstitial lung disease  (Ofev) which has GI side effects and is concerned about potential medication interactions and side effects.  Patient's bone density revealed a T score of -2.2 with a FRAX 10-year probability of hip fracture 7.5% and major osteoporosis related fracture of 21.4%.     GAD 7 : Generalized Anxiety Score 03/06/2021 12/19/2020 11/30/2020 11/16/2018  Nervous, Anxious, on Edge 0 0 0 0  Control/stop worrying 0 0 0 0  Worry too much - different things 0 0 0 0  Trouble relaxing 0 0 0 0  Restless 0 0 0 0  Easily annoyed or irritable 0 0 0 0  Afraid - awful might happen 0 0 0 0  Total GAD 7 Score 0 0 0 0  Anxiety Difficulty - - - Not difficult at all    Depression screen [facility] 2/9  03/06/2021 02/13/2021 12/19/2020 11/30/2020 08/17/2020  Decreased Interest 0 0 0 0 0  Down, Depressed, Hopeless 0 0 0 0 0  PHQ - 2 Score 0 0 0 0 0  Altered sleeping 0 - 1 0 0  Tired, decreased energy 0 - 0 0 0  Change in appetite 0 - 0 0 0  Feeling bad or failure about yourself  0 - 0 0 0  Trouble concentrating 0 - 0 0 0  Moving slowly or fidgety/restless 0 - 0 0 0  Suicidal thoughts 0 - 0 0 0  PHQ-9 Score 0 - 1 0 0  Difficult doing work/chores - - Somewhat difficult - -  Some recent data might be hidden      Impression and Recommendations:     1. Osteopenia, unspecified location   -Discussed with patient management options including first line treatment with bisphosphonate. Discussed potential side effects including, but not limited to, jaw osteonecrosis, SJS, dysphagia, gastric/duodenal ulcer, constipation/diarrhea, or severe musculoskeletal pain.   Patient is concerned about starting medication with new medication Ofev. Will contact pharmacist with pulmonary specialist and recommend their input and suggestion. Per LexiComp Drug Interaction, there is no interaction with Ofev and Fosamax, however, there are potential GI side effects with Fosamax. CMP 11/30/2020: Calcium 9.9, Cr 0.74 eGFR 88. -Recommend to start taking at least 600 mg of calcium and continue with Vitamin D supplement.    - As part of my medical decision making, I reviewed  the following data within the [place] History obtained from pt /family, CMA notes reviewed and incorporated if applicable, Labs reviewed, Radiograph/ tests reviewed if applicable and OV notes from prior OV's with me, as well as any other specialists she/he has seen since seeing me last, were all reviewed and used in my medical decision making process today.    - Additionally, when appropriate, discussion had with patient regarding our treatment plan, and their biases/concerns about that plan were used in my medical decision making today.     - The patient agreed with the plan and demonstrated an understanding of the instructions.   No barriers to understanding were identified.     - The patient was advised to call back or seek an in-person evaluation if the symptoms worsen or if the condition fails to improve as anticipated.   Return in about 4 months (around 07/07/2021) for HLD, thyroid and FBW few days prior.    No orders of the defined types were placed in this encounter.   No orders of the defined types were placed in this encounter.   There are no discontinued medications.     Time spent on telephone encounter was 24 minutes.   Note:  This note was prepared with assistance of Dragon voice recognition software. Occasional wrong-word or sound-a-like substitutions may have occurred due to the inherent limitations of voice recognition software.    The [place] was signed into law in 2016 which includes the topic of electronic health records.  This provides immediate access to information in MyChart.  This includes consultation notes, operative notes, office notes, lab results and pathology reports.  If you have any questions about what you read please let us know at your next visit or call us at the office.  We are right here with you.   __________________________________________________________________________________     Patient Care Team    Relationship Specialty Notifications Start End  Joy Williams, [place] PCP - General   09/19/19   Freada Bergeron, MD PCP - Cardiology Cardiology  07/27/20   Tanda Rockers, MD Consulting Physician Pulmonary Disease  01/06/17   Hurley Cisco, MD Consulting Physician Rheumatology  01/06/17   Lerry Paterson, MD Referring Physician   01/06/17    Comment: lung ca Surgeon  Lissa Morales, MD Referring Physician Internal Medicine  01/06/17    Comment: Oncologist- Chemo doc  [name], MD Consulting Physician Infectious Diseases  03/17/18     Comment: + RPR test  [name], Michae Kava, MD Rounding Team Cardiology  10/24/20      -Vitals obtained; medications/ allergies reconciled;  personal medical, social, Sx etc.histories were updated by CMA, reviewed by me and are reflected in chart   Patient Active Problem List   Diagnosis Date Noted   Bee sting allergy 12/13/2020   Pruritus 77/41/2878   Acute systolic CHF (congestive heart failure) ([place]) 10/25/2020   STEMI (ST elevation myocardial infarction) ([place]) 10/24/2020   Precordial chest pain 10/24/2020   Takotsubo cardiomyopathy    Closed fracture of phalanx of foot 08/17/2020   Symptomatic stenosis of right carotid artery 08/08/2020   TIA (transient ischemic attack) 08/06/2020   Anemia 08/06/2020   Encounter for counseling 02/08/2020   Suppression of immune system - subtherapeutic ([place]) 07/14/2019   History of TIA (transient ischemic attack) 05/10/2019   Non-ST elevated myocardial infarction ([place]) 02/25/2019   NSTEMI (non-ST elevated myocardial infarction) ([place]) 02/25/2019   Elevated troponin level not due to acute coronary  syndrome    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   On statin therapy 04/27/2018   Memory loss or impairment 00/92/3300   Complicated migraine 76/22/6333   Hyperlipidemia LDL goal <70 03/12/2018   History of RPR test, 9/18 felt to be false positive 03/12/2018   Stroke-like episode ([place]) s/p tPA 03/11/2018   Dysuria 10/14/2017   Abnormal finding on urinalysis 10/14/2017   Pharyngitis 10/14/2017   Closed nondisplaced fracture of base of fifth metacarpal bone of right hand 05/27/2017   Decreased range of motion of right ankle 05/27/2017   Pain and swelling of right wrist 05/27/2017   Age-related osteoporosis without current pathological fracture 02/20/2017   ASCUS of cervix with negative high risk HPV 01/29/2017   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   Tobacco abuse  01/22/2017   Tobacco abuse counseling 01/22/2017   Neoplastic malignant related fatigue 01/22/2017   Vitamin D insufficiency 01/22/2017   Estrogen deficiency 01/14/2017   H/O cardiac catheterization- in prep for lung ca sx 01/06/2017   Hypothyroidism 01/06/2017   Previous back surgeries 01/06/2017   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   Chronic diastolic congestive heart failure (HCC)    Chronic obstructive pulmonary disease (HCC)    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   Antineoplastic chemotherapy induced anemia 05/05/2015   History of epistaxis 03/30/2015   Raynaud's disease 01/19/2015   Malignant neoplasm of left lung ([place]) 12/30/2014   Lung cancer ([place]) 12/30/2014   Malignant neoplasm of overlapping sites of left lung ([place]) 12/06/2014   Nonintractable episodic headache 12/06/2014   Severe mitral regurgitation 11/15/2014   Scleroderma ([place]) 10/03/2014   Asthmatic bronchitis , chronic ([place]) 10/03/2014   Multiple pulmonary nodules 10/03/2014   Current Cigarette smoker 10/03/2014     Current Meds  Medication Sig   acetaminophen (TYLENOL) 500 MG tablet Take 1,000 mg by mouth every 6 (six) hours as needed for mild pain or headache.   Cholecalciferol (VITAMIN D) 125 MCG (5000 UT) CAPS Take 5,000 Units by mouth daily.   clopidogrel (PLAVIX) 75 MG tablet Take 1 tablet (75 mg total) by mouth daily.   EPINEPHrine (EPIPEN 2-PAK) 0.3 mg/0.3 mL IJ SOAJ injection Inject 0.3 mg into the muscle as needed for anaphylaxis.   ferrous sulfate 325 (65 FE) MG tablet Take 1 tablet (325 mg total) by mouth daily.   hydrOXYzine (ATARAX/VISTARIL) 25 MG tablet TAKE 1/2 TO 1 TABLET BY MOUTH TWICE DAILY AS NEEDED FOR ITCHING   levothyroxine (SYNTHROID) 175 MCG tablet TAKE 1 TABLET BY MOUTH EVERY DAY BEFORE BREAKFAST   MELATONIN PO Take 1 tablet by mouth at bedtime as needed (sleep).   Multiple Vitamins-Minerals (MULTIVITAMIN WITH MINERALS) tablet Take 1  tablet by mouth daily.   nicotine (NICODERM CQ - DOSED IN MG/24 HR) 7 mg/24hr patch Place 1 patch (7 mg total) onto the skin daily.   rosuvastatin (CRESTOR) 20 MG tablet Take 1 tablet (20 mg total) by mouth daily. (Patient taking differently: Take 20 mg by mouth every evening.)   TRELEGY ELLIPTA 100-62.5-25 MCG/INH AEPB Inhale 1 puff into the lungs daily.   Turmeric 500 MG CAPS Take 500 mg by mouth daily.   vitamin B-12 (CYANOCOBALAMIN) 1000 MCG tablet Take 1 tablet (1,000 mcg total) by mouth daily.     Allergies:  Allergies  Allergen Reactions   Penicillins Other (See Comments)    Unknown, occurred as a child   Bee  Venom Swelling    Severe swelling at the sting site.     ROS:  See above HPI for pertinent positives and negatives   Objective:   Height 5' 6.5" (1.689 m), weight 144 lb (65.3 kg).  (if some vitals are omitted, this means that patient was UNABLE to obtain them.) General: A & O * 3; sounds in no acute distress Respiratory: speaking in full sentences, no conversational dyspnea Psych: insight appears good, mood- appears full

## 2021-03-06 NOTE — Patient Instructions (Signed)
Alendronate Weekly Tablets What is this medication? ALENDRONATE (a LEN droe nate) treats osteoporosis. It works by Par[employer] stronger and less likely to break (fracture). It belongs to a group of medications called bisphosphonates. This medicine may be used for other purposes; ask your health care provider or pharmacist if you have questions. COMMON BRAND NAME(S): Fosamax What should I tell my care team before I take this medication? They need to know if you have any of these conditions: Bleeding disorder Cancer Dental disease Difficulty swallowing Infection (fever, chills, cough, sore throat, pain or trouble passing urine) Kidney disease Low levels of calcium or other minerals in the blood Low red blood cell counts Receiving steroids like dexamethasone or prednisone Stomach or intestine problems Trouble sitting or standing for 30 minutes An unusual or allergic reaction to alendronate, other medications, foods, dyes or preservatives Pregnant or trying to get pregnant Breast-feeding How should I use this medication? Take this medication by mouth with a full glass of water. Take it as directed on the prescription label at the same day of each week. Take the dose right after waking up. Do not eat or drink anything before taking it. Do not take it with any other drink except water. Do not chew or crush the tablet. After taking it, do not eat breakfast, drink, or take any other medications or vitamins for at least 30 minutes. Sit or stand up for at least 30 minutes after you take it. Do not lie down. Keep taking it unless your care team tells you to stop. A special MedGuide will be given to you by the pharmacist with each prescription and refill. Be sure to read this information carefully each time. Talk to your care team about the use of this medication in children. Special care may be needed. Overdosage: If you think you have taken too much of this medicine contact a poison control  center or emergency room at once. NOTE: This medicine is only for you. Do not share this medicine with others. What if I miss a dose? If you take your medication once a day, skip it. Take your next dose at the scheduled time the next morning. Do not take two doses on the same day. If you take your medication once a week, take the missed dose on the morning after you remember. Do not take two doses on the same day. What may interact with this medication? Aluminum hydroxide Antacids Aspirin Calcium supplements Iron supplements Medications for inflammation like ibuprofen, naproxen, and others Magnesium supplements Vitamins with minerals This list may not describe all possible interactions. Give your health care provider a list of all the medicines, herbs, non-prescription drugs, or dietary supplements you use. Also tell them if you smoke, drink alcohol, or use illegal drugs. Some items may interact with your medicine. What should I watch for while using this medication? Visit your care team for regular checks on your progress. It may be some time before you see the benefit from this medication. Some people who take this medication have severe bone, joint, or muscle pain. This medication may also increase your risk for jaw problems or a broken thigh bone. Tell your care team right away if you have severe pain in your jaw, bones, joints, or muscles. Tell you care team if you have any pain that does not go away or that gets worse. Tell your dentist and dental surgeon that you are taking this medication. You should not have major dental surgery while on  this medication. See your dentist to have a dental exam and fix any dental problems before starting this medication. Take good care of your teeth while on this medication. Make sure you see your dentist for regular follow-up appointments. You should make sure you get enough calcium and vitamin D while you are taking this medication. Discuss the foods you  eat and the vitamins you take with your care team. You may need blood work done while you are taking this medication. What side effects may I notice from receiving this medication? Side effects that you should report to your care team as soon as possible: Allergic reactions-skin rash, itching, hives, swelling of the face, lips, tongue, or throat Low calcium level-muscle pain or cramps, confusion, tingling, or numbness in the hands or feet Osteonecrosis of the jaw-pain, swelling, or redness in the mouth, numbness of the jaw, poor healing after dental work, unusual discharge from the mouth, visible bones in the mouth Pain or trouble swallowing Severe bone, joint, or muscle pain Stomach bleeding-bloody or black, tar-like stools, vomiting blood or brown material that looks like coffee grounds Side effects that usually do not require medical attention (report to your care team if they continue or are bothersome): Constipation Diarrhea Nausea Stomach pain This list may not describe all possible side effects. Call your doctor for medical advice about side effects. You may report side effects to FDA at 1-800-FDA-1088. Where should I keep my medication? Keep out of the reach of children and pets. Store at room temperature between 15 and 30 degrees C (59 and 86 degrees F). Throw away any unused medication after the expiration date. NOTE: This sheet is a summary. It may not cover all possible information. If you have questions about this medicine, talk to your doctor, pharmacist, or health care provider.  2022 Elsevier/Gold Standard (2020-03-28 13:53:40)  Denosumab injection What is this medication? DENOSUMAB (den oh sue mab) slows bone breakdown. Prolia is used to treat osteoporosis in women after menopause and in men, and in people who are taking corticosteroids for 6 months or more. Delton See is used to treat a high calcium level due to cancer and to prevent bone fractures and other bone problems caused  by multiple myeloma or cancer bone metastases. Delton See is also used to treat giant cell tumor of the bone. This medicine may be used for other purposes; ask your health care provider or pharmacist if you have questions. COMMON BRAND NAME(S): Prolia, XGEVA What should I tell my care team before I take this medication? They need to know if you have any of these conditions: dental disease having surgery or tooth extraction infection kidney disease low levels of calcium or Vitamin D in the blood malnutrition on hemodialysis skin conditions or sensitivity thyroid or parathyroid disease an unusual reaction to denosumab, other medicines, foods, dyes, or preservatives pregnant or trying to get pregnant breast-feeding How should I use this medication? This medicine is for injection under the skin. It is given by a health care professional in a hospital or clinic setting. A special MedGuide will be given to you before each treatment. Be sure to read this information carefully each time. For Prolia, talk to your pediatrician regarding the use of this medicine in children. Special care may be needed. For Delton See, talk to your pediatrician regarding the use of this medicine in children. While this drug may be prescribed for children as young as 13 years for selected conditions, precautions do apply. Overdosage: If you think you have  taken too much of this medicine contact a poison control center or emergency room at once. NOTE: This medicine is only for you. Do not share this medicine with others. What if I miss a dose? It is important not to miss your dose. Call your doctor or health care professional if you are unable to keep an appointment. What may interact with this medication? Do not take this medicine with any of the following medications: other medicines containing denosumab This medicine may also interact with the following medications: medicines that lower your chance of fighting  infection steroid medicines like prednisone or cortisone This list may not describe all possible interactions. Give your health care provider a list of all the medicines, herbs, non-prescription drugs, or dietary supplements you use. Also tell them if you smoke, drink alcohol, or use illegal drugs. Some items may interact with your medicine. What should I watch for while using this medication? Visit your doctor or health care professional for regular checks on your progress. Your doctor or health care professional may order blood tests and other tests to see how you are doing. Call your doctor or health care professional for advice if you get a fever, chills or sore throat, or other symptoms of a cold or flu. Do not treat yourself. This drug may decrease your body's ability to fight infection. Try to avoid being around people who are sick. You should make sure you get enough calcium and vitamin D while you are taking this medicine, unless your doctor tells you not to. Discuss the foods you eat and the vitamins you take with your health care professional. See your dentist regularly. Brush and floss your teeth as directed. Before you have any dental work done, tell your dentist you are receiving this medicine. Do not become pregnant while taking this medicine or for 5 months after stopping it. Talk with your doctor or health care professional about your birth control options while taking this medicine. Women should inform their doctor if they wish to become pregnant or think they might be pregnant. There is a potential for serious side effects to an unborn child. Talk to your health care professional or pharmacist for more information. What side effects may I notice from receiving this medication? Side effects that you should report to your doctor or health care professional as soon as possible: allergic reactions like skin rash, itching or hives, swelling of the face, lips, or tongue bone pain breathing  problems dizziness jaw pain, especially after dental work redness, blistering, peeling of the skin signs and symptoms of infection like fever or chills; cough; sore throat; pain or trouble passing urine signs of low calcium like fast heartbeat, muscle cramps or muscle pain; pain, tingling, numbness in the hands or feet; seizures unusual bleeding or bruising unusually weak or tired Side effects that usually do not require medical attention (report to your doctor or health care professional if they continue or are bothersome): constipation diarrhea headache joint pain loss of appetite muscle pain runny nose tiredness upset stomach This list may not describe all possible side effects. Call your doctor for medical advice about side effects. You may report side effects to FDA at 1-800-FDA-1088. Where should I keep my medication? This medicine is only given in a clinic, doctor's office, or other health care setting and will not be stored at home. NOTE: This sheet is a summary. It may not cover all possible information. If you have questions about this medicine, talk to your doctor,  pharmacist, or health care provider.  2022 Elsevier/Gold Standard (2017-09-12 16:10:44)

## 2021-03-07 ENCOUNTER — Encounter (HOSPITAL_COMMUNITY)
Admission: RE | Admit: 2021-03-07 | Discharge: 2021-03-07 | Disposition: A | Discharge status: Home / Self Care | Payer: Medicare Other | Source: Ambulatory Visit | Attending: Cardiology | Admitting: Cardiology

## 2021-03-07 ENCOUNTER — Other Ambulatory Visit: Payer: Self-pay

## 2021-03-07 ENCOUNTER — Telehealth: Payer: Self-pay | Admitting: Internal Medicine

## 2021-03-07 DIAGNOSIS — I5181 Takotsubo syndrome: Secondary | ICD-10-CM | POA: Diagnosis not present

## 2021-03-07 DIAGNOSIS — I252 Old myocardial infarction: Secondary | ICD-10-CM | POA: Diagnosis not present

## 2021-03-07 DIAGNOSIS — Z5189 Encounter for other specified aftercare: Secondary | ICD-10-CM | POA: Diagnosis not present

## 2021-03-07 NOTE — Telephone Encounter (Signed)
I called the patient and she reports that she has had a couple of episodes of diarrhea today and a slight headache. She has not taken another dose at this time due to not being able to make it to the bathroom. She did take a couple of anti-diarrhea medication to help with the loose stools.   She was going to take a Tylenol for her headache and she wants to know if you would recommend a lower dose. Please advise.   She did talk to [facility] this afternoon as well.

## 2021-03-07 NOTE — Telephone Encounter (Signed)
She took one dose of Ofev at 10:30am and had first bout of diarrhea around 3pm. Patient took 2 tablets of Imodium and has not had another episode of diarrhea since. Patient has not had any change in diet in the past few days - ate chili last night which she reports usually agrees with her stomach and does not cause diarrhea.   States she also had headache but hasn't taken anything to help with this yet.  Advised she can take acetaminophen if she'd like  Advised her to hold Ofev dose tonight and until Dr. Chase Caller makes recommendations.  Knox Saliva, PharmD, MPH, BCPS Clinical Pharmacist (Rheumatology and Pulmonology)

## 2021-03-08 NOTE — Progress Notes (Signed)
Reviewed home exercise Rx with patient today. Encouraged warm-up, cool-down, and stretching. Reviewed THRR of 60-121 and keeping RPE between 11-13. Hydration encouraged. Discussed weather parameters for temperature and humidity for safe exercise outdoors. Reviewed S/S that would require patient to terminate activity and when to MD vs 911. PT verbalized understanding of the home exercise RX and was provided a copy.   Lesly Rubenstein MS, ACSM-CEP, CCRP

## 2021-03-09 ENCOUNTER — Encounter (HOSPITAL_COMMUNITY): Payer: Medicare Other

## 2021-03-09 ENCOUNTER — Encounter: Payer: Self-pay | Admitting: Physician Assistant

## 2021-03-09 NOTE — Telephone Encounter (Signed)
I replied to this just now on another note question by the pharmacist.  The pharmacist probably not in at this hour.  Therefore please read that note and advise the patient

## 2021-03-09 NOTE — Telephone Encounter (Signed)
MR pt is requesting recs for taking the OFEV.  There is a phone note that has been sent to you by pharmacy on 10/19.  The pt was told to hold her dose that day and we would get back to her.  Please advise. thanks

## 2021-03-09 NOTE — Telephone Encounter (Signed)
Hold nintedanib for 1 week and then start at 1 tablet "day for 1 week and then increase to 1 tablet twice daily.  At all times avoid caffeine and sugars and artificial sugars and fruit juices

## 2021-03-12 ENCOUNTER — Other Ambulatory Visit: Payer: Self-pay

## 2021-03-12 ENCOUNTER — Encounter (HOSPITAL_COMMUNITY)
Admission: RE | Admit: 2021-03-12 | Discharge: 2021-03-12 | Disposition: A | Discharge status: Home / Self Care | Payer: Medicare Other | Source: Ambulatory Visit | Attending: Cardiology | Admitting: Cardiology

## 2021-03-12 DIAGNOSIS — I5181 Takotsubo syndrome: Secondary | ICD-10-CM

## 2021-03-12 DIAGNOSIS — I252 Old myocardial infarction: Secondary | ICD-10-CM | POA: Diagnosis not present

## 2021-03-12 DIAGNOSIS — Z5189 Encounter for other specified aftercare: Secondary | ICD-10-CM | POA: Diagnosis not present

## 2021-03-12 NOTE — Telephone Encounter (Signed)
Spoke with patient regarding Dr. Golden Pop recommendations. She verbalized understanding and requested MyChart message detailing recs. Pt aware that she can respond directly back to me via [place], PharmD, MPH, BCPS Clinical Pharmacist (Rheumatology and Pulmonology)

## 2021-03-14 ENCOUNTER — Other Ambulatory Visit: Payer: Self-pay

## 2021-03-14 ENCOUNTER — Encounter (HOSPITAL_COMMUNITY)
Admission: RE | Admit: 2021-03-14 | Discharge: 2021-03-14 | Disposition: A | Discharge status: Home / Self Care | Payer: Medicare Other | Source: Ambulatory Visit | Attending: Cardiology | Admitting: Cardiology

## 2021-03-14 DIAGNOSIS — I252 Old myocardial infarction: Secondary | ICD-10-CM | POA: Diagnosis not present

## 2021-03-14 DIAGNOSIS — I5181 Takotsubo syndrome: Secondary | ICD-10-CM

## 2021-03-14 DIAGNOSIS — Z5189 Encounter for other specified aftercare: Secondary | ICD-10-CM | POA: Diagnosis not present

## 2021-03-14 NOTE — Telephone Encounter (Addendum)
Called [employer] -  Patient approved for OFEV patient assistance from 10/12/2 to 05/19/21.  Income documents placed in locked pharmacy office cabinet for [id number] renewal application  Phone number: [phone]  Knox Saliva, PharmD, MPH, BCPS Clinical Pharmacist (Rheumatology and Pulmonology)

## 2021-03-16 ENCOUNTER — Encounter (HOSPITAL_COMMUNITY)
Admission: RE | Admit: 2021-03-16 | Discharge: 2021-03-16 | Disposition: A | Discharge status: Home / Self Care | Payer: Medicare Other | Source: Ambulatory Visit | Attending: Cardiology | Admitting: Cardiology

## 2021-03-16 ENCOUNTER — Other Ambulatory Visit: Payer: Self-pay

## 2021-03-16 DIAGNOSIS — I5181 Takotsubo syndrome: Secondary | ICD-10-CM

## 2021-03-16 DIAGNOSIS — I214 Non-ST elevation (NSTEMI) myocardial infarction: Secondary | ICD-10-CM

## 2021-03-16 DIAGNOSIS — I252 Old myocardial infarction: Secondary | ICD-10-CM | POA: Diagnosis not present

## 2021-03-16 DIAGNOSIS — Z5189 Encounter for other specified aftercare: Secondary | ICD-10-CM | POA: Diagnosis not present

## 2021-03-19 ENCOUNTER — Encounter (HOSPITAL_COMMUNITY)
Admission: RE | Admit: 2021-03-19 | Discharge: 2021-03-19 | Disposition: A | Discharge status: Home / Self Care | Payer: Medicare Other | Source: Ambulatory Visit | Attending: Cardiology | Admitting: Cardiology

## 2021-03-19 ENCOUNTER — Other Ambulatory Visit: Payer: Self-pay

## 2021-03-19 DIAGNOSIS — Z5189 Encounter for other specified aftercare: Secondary | ICD-10-CM | POA: Diagnosis not present

## 2021-03-19 DIAGNOSIS — I252 Old myocardial infarction: Secondary | ICD-10-CM | POA: Diagnosis not present

## 2021-03-19 DIAGNOSIS — I5181 Takotsubo syndrome: Secondary | ICD-10-CM | POA: Diagnosis not present

## 2021-03-21 ENCOUNTER — Encounter (HOSPITAL_COMMUNITY)
Admission: RE | Admit: 2021-03-21 | Discharge: 2021-03-21 | Disposition: A | Discharge status: Home / Self Care | Payer: Medicare Other | Source: Ambulatory Visit | Attending: Cardiology | Admitting: Cardiology

## 2021-03-21 ENCOUNTER — Other Ambulatory Visit: Payer: Self-pay

## 2021-03-21 DIAGNOSIS — I214 Non-ST elevation (NSTEMI) myocardial infarction: Secondary | ICD-10-CM | POA: Insufficient documentation

## 2021-03-21 DIAGNOSIS — I5181 Takotsubo syndrome: Secondary | ICD-10-CM | POA: Insufficient documentation

## 2021-03-21 NOTE — Progress Notes (Signed)
Cardiac Individual Treatment Plan  Patient Details  Name: Joy Williams MRN: [id number] Date of Birth: 03/29/1951 Referring Provider:   Flowsheet Row CARDIAC REHAB PHASE II ORIENTATION from 02/13/2021 in [place]  Referring Provider Gwyndolyn Kaufman, MD       Initial Encounter Date:  [facility] PHASE II ORIENTATION from 02/13/2021 in [place]  Date 02/13/21       Visit Diagnosis: Takotsubo cardiomyopathy  Patient's Home Medications on Admission:  Current Outpatient Medications:    acetaminophen (TYLENOL) 500 MG tablet, Take 1,000 mg by mouth every 6 (six) hours as needed for mild pain or headache., Disp: , Rfl:    Cholecalciferol (VITAMIN D) 125 MCG (5000 UT) CAPS, Take 5,000 Units by mouth daily., Disp: , Rfl:    clopidogrel (PLAVIX) 75 MG tablet, Take 1 tablet (75 mg total) by mouth daily., Disp: 90 tablet, Rfl: 3   EPINEPHrine (EPIPEN 2-PAK) 0.3 mg/0.3 mL IJ SOAJ injection, Inject 0.3 mg into the muscle as needed for anaphylaxis., Disp: 1 each, Rfl: 1   ferrous sulfate 325 (65 FE) MG tablet, Take 1 tablet (325 mg total) by mouth daily., Disp: 30 tablet, Rfl: 6   hydrOXYzine (ATARAX/VISTARIL) 25 MG tablet, TAKE 1/2 TO 1 TABLET BY MOUTH TWICE DAILY AS NEEDED FOR ITCHING, Disp: 45 tablet, Rfl: 1   levothyroxine (SYNTHROID) 175 MCG tablet, TAKE 1 TABLET BY MOUTH EVERY DAY BEFORE BREAKFAST, Disp: 90 tablet, Rfl: 1   MELATONIN PO, Take 1 tablet by mouth at bedtime as needed (sleep)., Disp: , Rfl:    Multiple Vitamins-Minerals (MULTIVITAMIN WITH MINERALS) tablet, Take 1 tablet by mouth daily., Disp: , Rfl:    nicotine (NICODERM CQ - DOSED IN MG/24 HR) 7 mg/24hr patch, Place 1 patch (7 mg total) onto the skin daily., Disp: 28 patch, Rfl: 0   rosuvastatin (CRESTOR) 20 MG tablet, Take 1 tablet (20 mg total) by mouth daily. (Patient taking differently: Take 20 mg by mouth every evening.), Disp: 90 tablet,  Rfl: 0   TRELEGY ELLIPTA 100-62.5-25 MCG/INH AEPB, Inhale 1 puff into the lungs daily., Disp: , Rfl:    Turmeric 500 MG CAPS, Take 500 mg by mouth daily., Disp: , Rfl:    vitamin B-12 (CYANOCOBALAMIN) 1000 MCG tablet, Take 1 tablet (1,000 mcg total) by mouth daily., Disp: , Rfl:   Past Medical History: Past Medical History:  Diagnosis Date   Allergy    Asthma    pt denies this, but is on Dulera   Carotid artery disease ([place]) 2019   1-39% stenosis by Dopplers    Chronic diastolic congestive heart failure (HCC)    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   Complication of anesthesia    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 [facility] (had to have botox injection)   COPD (chronic obstructive pulmonary disease) ([place])    Family history of adverse reaction to anesthesia    Mother- very sensitive to medication   Heart murmur    MVP   Hypothyroidism    Lung cancer ([place]) 12/30/2014   Synchronous primaries:  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, each treated by wedge resection with post-op adjuvant chemoRx at [facility]   MVP (mitral valve prolapse) 2018   s/p Minimally-Invasive Mitral Valve Replacement w/  [facility] Mitral bovine bioprosthetic tissue valve (size 30mm, model # 7300TFX, serial # [id number])   PONV (postoperative nausea and vomiting)    Raynaud's syndrome  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   31 mm [facility] mitral bovine bioprosthetic tissue valve placed via right mini thoracotomy approach   Severe mitral regurgitation 11/15/2014   Shortness of breath dyspnea    with exertion   STD (sexually transmitted disease)    STEMI (ST elevation myocardial infarction) ([place]) 10/24/2020   Telangiectasia     Tobacco Use: Social History   Tobacco Use  Smoking Status Every Day   Packs/day: 0.75   Years: 24.00   Pack years: 18.00   Types: E-cigarettes, Cigarettes   Start date: 05/21/1983  Smokeless Tobacco Never  Tobacco Comments    Currently smoking 3cigs per day as of 02/15/21    Labs: Recent Review Flowsheet Data     Labs for ITP Cardiac and Pulmonary Rehab Latest Ref Rng & Units 11/11/2018 02/25/2019 06/15/2020 08/07/2020 10/24/2020   Cholestrol 0 - 200 mg/dL 150 133 - 116 135   LDLCALC 0 - 99 mg/dL 64 57 - 50 55   LDLDIRECT 0 - 99 mg/dL 68 - 63 - -   HDL >40 mg/dL 70 61 - 46 60   Trlycerides <150 mg/dL 78 74 - 102 100   Hemoglobin A1c 4.8 - 5.6 % - - - 5.6 5.5   PHART 7.350 - 7.450 - - - - -   PCO2ART 32.0 - 48.0 mmHg - - - - -   HCO3 20.0 - 28.0 mmol/L - - - - -   TCO2 22 - 32 mmol/L - - - - 24   ACIDBASEDEF 0.0 - 2.0 mmol/L - - - - -   O2SAT % - - - - -       Capillary Blood Glucose: Lab Results  Component Value Date   GLUCAP 94 11/03/2020   GLUCAP 85 11/03/2020   GLUCAP 81 03/11/2018   GLUCAP 86 07/03/2016   GLUCAP 108 (H) 07/02/2016     Exercise Target Goals: Exercise Program Goal: Individual exercise prescription set using results from initial 6 min walk test and THRR while considering  patient's activity barriers and safety.   Exercise Prescription Goal: Starting with aerobic activity 30 plus minutes a day, 3 days per week for initial exercise prescription. Provide home exercise prescription and guidelines that participant acknowledges understanding prior to discharge.  Activity Barriers & Risk Stratification:  Activity Barriers & Cardiac Risk Stratification - 02/13/21 1028       Activity Barriers & Cardiac Risk Stratification   Activity Barriers Arthritis;Back Problems;Shortness of Breath;Deconditioning;Decreased Ventricular Function;Balance Concerns;Other (comment)    Comments 3 back surgeries ( herniated disc L4-L5 and L3-L4),    Cardiac Risk Stratification High             6 Minute Walk:  6 Minute Walk     Row Name 02/13/21 0853         6 Minute Walk   Phase Initial     Distance 1450 feet     Walk Time 6 minutes     # of Rest Breaks 0     MPH 2.75     METS 3.35      RPE 11     Perceived Dyspnea  0     VO2 Peak 11.72     Symptoms Yes (comment)     Comments Bilateral Hip pain 2/10     Resting HR 88 bpm     Resting BP 126/82     Resting Oxygen Saturation  98 %  Exercise Oxygen Saturation  during 6 min walk 98 %     Max Ex. HR 94 bpm     Max Ex. BP 134/65     2 Minute Post BP 126/76              Oxygen Initial Assessment:   Oxygen Re-Evaluation:   Oxygen Discharge (Final Oxygen Re-Evaluation):   Initial Exercise Prescription:  Initial Exercise Prescription - 02/13/21 1100       Date of Initial Exercise RX and Referring Provider   Date 02/13/21    Referring Provider Gwyndolyn Kaufman, MD    Expected Discharge Date 02/09/21      NuStep   Level 2    SPM 85    Minutes 15    METs 2.2      Track   Laps 13    Minutes 15    METs 2.51      Prescription Details   Frequency (times per week) 3    Duration Progress to 30 minutes of continuous aerobic without signs/symptoms of physical distress      Intensity   THRR 40-80% of Max Heartrate 60-121    Ratings of Perceived Exertion 11-13    Perceived Dyspnea 0-4      Progression   Progression Continue progressive overload as per policy without signs/symptoms or physical distress.      Resistance Training   Training Prescription Yes    Weight 2 lbs    Reps 10-15             Perform Capillary Blood Glucose checks as needed.  Exercise Prescription Changes:   Exercise Prescription Changes     Row Name 02/19/21 1200 03/02/21 1500 03/07/21 1200 03/19/21 1020       Response to Exercise   Blood Pressure (Admit) 124/72 128/70 140/70 102/58    Blood Pressure (Exercise) 122/82 122/78 118/76 122/64    Blood Pressure (Exit) 105/76 102/70 112/68 118/60    Heart Rate (Admit) 70 bpm 75 bpm 80 bpm 80 bpm    Heart Rate (Exercise) 95 bpm 101 bpm 94 bpm 103 bpm    Heart Rate (Exit) 76 bpm 69 bpm 75 bpm 74 bpm    Oxygen Saturation (Admit) 99 % -- -- --    Oxygen Saturation  (Exercise) 98 % -- -- --    Oxygen Saturation (Exit) 98 % -- -- --    Rating of Perceived Exertion (Exercise) 11 11 12 11     Perceived Dyspnea (Exercise) 0 -- -- --    Symptoms 2 bouts of lightheadedness: nustep and weights. Still has some lightheadedness after thr nustep None None    Comments Pt's first day in hte CRP2 program Reviewed METs Reviewed home exercise Rx Reviewed METs and Goals    Duration Progress to 30 minutes of  aerobic without signs/symptoms of physical distress Continue with 30 min of aerobic exercise without signs/symptoms of physical distress. Continue with 30 min of aerobic exercise without signs/symptoms of physical distress. Continue with 30 min of aerobic exercise without signs/symptoms of physical distress.    Intensity THRR unchanged THRR unchanged THRR unchanged THRR unchanged      Progression   Progression Continue to progress workloads to maintain intensity without signs/symptoms of physical distress. Continue to progress workloads to maintain intensity without signs/symptoms of physical distress. Continue to progress workloads to maintain intensity without signs/symptoms of physical distress. Continue to progress workloads to maintain intensity without signs/symptoms of physical distress.    Average METs 2.3  2.77 2.9 3      Resistance Training   Training Prescription Yes Yes No Yes    Weight 2 lbs 2 lbs No weights on Wednesdays 3 lbs    Reps 10-15 10-15 -- 10-15    Time 10 Minutes 10 Minutes -- 10 Minutes      Interval Training   Interval Training No No No No      NuStep   Level 2 2 3 3     SPM 80 85 90 90    Minutes 15 15 15 15     METs 1.9 2.1 2.5 2.5      Track   Laps 15 15 15 15     Minutes 15 21 20 22     METs 2.74 3.44 3.32 3.55      Home Exercise Plan   Plans to continue exercise at -- -- Home (comment) Home (comment)    Frequency -- -- Add 2 additional days to program exercise sessions. Add 2 additional days to program exercise sessions.     Initial Home Exercises Provided -- -- 03/07/21 03/07/21             Exercise Comments:   Exercise Comments     Row Name 02/19/21 1222 03/02/21 1528 03/07/21 1303 03/19/21 1015     Exercise Comments Pt's first day in the CRP2 program, Pt experienced 2 episodes of lightheadedness, one after the nustep and one after the weights. Pt voices that she gets lightheaded  at home. May need to adjust exercise workloads next session. Reviewed METs. Pt is making progress in the CRP2 program. Reviewed home exercise Rx with patient. She verbalized understanding of the home exercise Rx and was provided a copy. Reviewed METs and goals. Pt is making progress and voices walking more at home as discussed in the home exercise Rx.             Exercise Goals and Review:   Exercise Goals     Row Name 02/13/21 1031             Exercise Goals   Increase Physical Activity Yes       Intervention Provide advice, education, support and counseling about physical activity/exercise needs.;Develop an individualized exercise prescription for aerobic and resistive training based on initial evaluation findings, risk stratification, comorbidities and participant's personal goals.       Expected Outcomes Short Term: Attend rehab on a regular basis to increase amount of physical activity.;Long Term: Add in home exercise to make exercise part of routine and to increase amount of physical activity.;Long Term: Exercising regularly at least 3-5 days a week.       Increase Strength and Stamina Yes       Intervention Provide advice, education, support and counseling about physical activity/exercise needs.;Develop an individualized exercise prescription for aerobic and resistive training based on initial evaluation findings, risk stratification, comorbidities and participant's personal goals.       Expected Outcomes Short Term: Perform resistance training exercises routinely during rehab and add in resistance training at  home;Short Term: Increase workloads from initial exercise prescription for resistance, speed, and METs.;Long Term: Improve cardiorespiratory fitness, muscular endurance and strength as measured by increased METs and functional capacity (6MWT)       Able to understand and use rate of perceived exertion (RPE) scale Yes       Intervention Provide education and explanation on how to use RPE scale       Expected Outcomes Short Term: Able to use  RPE daily in rehab to express subjective intensity level;Long Term:  Able to use RPE to guide intensity level when exercising independently       Knowledge and understanding of Target Heart Rate Range (THRR) Yes       Intervention Provide education and explanation of THRR including how the numbers were predicted and where they are located for reference       Expected Outcomes Short Term: Able to state/look up THRR;Long Term: Able to use THRR to govern intensity when exercising independently;Short Term: Able to use daily as guideline for intensity in rehab       Understanding of Exercise Prescription Yes       Intervention Provide education, explanation, and written materials on patient's individual exercise prescription       Expected Outcomes Short Term: Able to explain program exercise prescription;Long Term: Able to explain home exercise prescription to exercise independently                Exercise Goals Re-Evaluation :  Exercise Goals Re-Evaluation     Row Name 02/19/21 1221 03/07/21 1303 03/19/21 1015         Exercise Goal Re-Evaluation   Exercise Goals Review Increase Physical Activity;Increase Strength and Stamina;Knowledge and understanding of Target Heart Rate Range (THRR);Understanding of Exercise Prescription Increase Physical Activity;Increase Strength and Stamina;Able to understand and use rate of perceived exertion (RPE) scale;Knowledge and understanding of Target Heart Rate Range (THRR);Able to check pulse independently;Understanding of  Exercise Prescription Increase Physical Activity;Increase Strength and Stamina;Able to understand and use rate of perceived exertion (RPE) scale;Knowledge and understanding of Target Heart Rate Range (THRR);Able to check pulse independently;Understanding of Exercise Prescription     Comments Pt's first day in the the CRP2 program. Pt understnads the exercise Rx, THRR and RPE scale. Reviewed home exercise Rx with patient today. Encouraged to start walking at home 2 x/week. Pt can break walks up into 3 - 10 min or 2 15 min bouts. Reviewed METs and goals. Pt is making progress toward goals. She is walking better and she completed 22 laps in 15 minutes today which is the most to date.     Expected Outcomes Will continue to montior patient and adjust exercise workloads as tolerated Pt will begin to walk at home. Will continue to montior and progress exercise workloads as tolerated.               Discharge Exercise Prescription (Final Exercise Prescription Changes):  Exercise Prescription Changes - 03/19/21 1020       Response to Exercise   Blood Pressure (Admit) 102/58    Blood Pressure (Exercise) 122/64    Blood Pressure (Exit) 118/60    Heart Rate (Admit) 80 bpm    Heart Rate (Exercise) 103 bpm    Heart Rate (Exit) 74 bpm    Rating of Perceived Exertion (Exercise) 11    Symptoms None    Comments Reviewed METs and Goals    Duration Continue with 30 min of aerobic exercise without signs/symptoms of physical distress.    Intensity THRR unchanged      Progression   Progression Continue to progress workloads to maintain intensity without signs/symptoms of physical distress.    Average METs 3      Resistance Training   Training Prescription Yes    Weight 3 lbs    Reps 10-15    Time 10 Minutes      Interval Training   Interval Training No  NuStep   Level 3    SPM 90    Minutes 15    METs 2.5      Track   Laps 15    Minutes 22    METs 3.55      Home Exercise Plan   Plans  to continue exercise at Home (comment)    Frequency Add 2 additional days to program exercise sessions.    Initial Home Exercises Provided 03/07/21             Nutrition:  Target Goals: Understanding of nutrition guidelines, daily intake of sodium 1500mg , cholesterol 200mg , calories 30% from fat and 7% or less from saturated fats, daily to have 5 or more servings of fruits and vegetables.  Biometrics:  Pre Biometrics - 02/13/21 0957       Pre Biometrics   Waist Circumference 37 inches    Hip Circumference 39 inches    Waist to Hip Ratio 0.95 %    Triceps Skinfold 23 mm    % Body Fat 36.8 %    Grip Strength 24 kg    Flexibility 12.25 in    Single Leg Stand 5.56 seconds              Nutrition Therapy Plan and Nutrition Goals:   Nutrition Assessments:  MEDIFICTS Score Key: ?70 Need to make dietary changes  40-70 Heart Healthy Diet ? 40 Therapeutic Level Cholesterol Diet   Picture Your Plate Scores: <58 Unhealthy dietary pattern with much room for improvement. 41-50 Dietary pattern unlikely to meet recommendations for good health and room for improvement. 51-60 More healthful dietary pattern, with some room for improvement.  >60 Healthy dietary pattern, although there may be some specific behaviors that could be improved.    Nutrition Goals Re-Evaluation:   Nutrition Goals Discharge (Final Nutrition Goals Re-Evaluation):   Psychosocial: Target Goals: Acknowledge presence or absence of significant depression and/or stress, maximize coping skills, provide positive support system. Participant is able to verbalize types and ability to use techniques and skills needed for reducing stress and depression.  Initial Review & Psychosocial Screening:  Initial Psych Review & Screening - 02/13/21 0846       Initial Review   Current issues with None Identified      Family Dynamics   Good Support System? Yes   [name] lives alone. [name] has her children and  grandchildren who live in the area for support     Barriers   Psychosocial barriers to participate in program There are no identifiable barriers or psychosocial needs.      Screening Interventions   Interventions Encouraged to exercise             Quality of Life Scores:  Quality of Life - 02/13/21 1002       Quality of Life   Select Quality of Life      Quality of Life Scores   Health/Function Pre 20.97 %    Socioeconomic Pre 22.8 %    Psych/Spiritual Pre 24.86 %    Family Pre 24.9 %    GLOBAL Pre 22.56 %            Scores of 19 and below usually indicate a poorer quality of life in these areas.  A difference of  2-3 points is a clinically meaningful difference.  A difference of 2-3 points in the total score of the Quality of Life Index has been associated with significant improvement in overall quality of life, self-image, physical symptoms,  and general health in studies assessing change in quality of life.  PHQ-9: Recent Review Flowsheet Data     Depression screen [facility] 2/9 03/06/2021 02/13/2021 12/19/2020 11/30/2020 08/17/2020   Decreased Interest 0 0 0 0 0   Down, Depressed, Hopeless 0 0 0 0 0   PHQ - 2 Score 0 0 0 0 0   Altered sleeping 0 - 1 0 0   Tired, decreased energy 0 - 0 0 0   Change in appetite 0 - 0 0 0   Feeling bad or failure about yourself  0 - 0 0 0   Trouble concentrating 0 - 0 0 0   Moving slowly or fidgety/restless 0 - 0 0 0   Suicidal thoughts 0 - 0 0 0   PHQ-9 Score 0 - 1 0 0   Difficult doing work/chores - - Somewhat difficult - -      Interpretation of Total Score  Total Score Depression Severity:  1-4 = Minimal depression, 5-9 = Mild depression, 10-14 = Moderate depression, 15-19 = Moderately severe depression, 20-27 = Severe depression   Psychosocial Evaluation and Intervention:   Psychosocial Re-Evaluation:  Psychosocial Re-Evaluation     Row Name 02/19/21 1542 03/21/21 1725           Psychosocial Re-Evaluation   Current  issues with None Identified None Identified      Interventions Encouraged to attend Cardiac Rehabilitation for the exercise Encouraged to attend Cardiac Rehabilitation for the exercise      Continue Psychosocial Services  No Follow up required No Follow up required               Psychosocial Discharge (Final Psychosocial Re-Evaluation):  Psychosocial Re-Evaluation - 03/21/21 1725       Psychosocial Re-Evaluation   Current issues with None Identified    Interventions Encouraged to attend Cardiac Rehabilitation for the exercise    Continue Psychosocial Services  No Follow up required             Vocational Rehabilitation: Provide vocational rehab assistance to qualifying candidates.   Vocational Rehab Evaluation & Intervention:  Vocational Rehab - 02/13/21 0847       Initial Vocational Rehab Evaluation & Intervention   Assessment shows need for Vocational Rehabilitation No   [name] is retired and does not not need vocational rehab at this time.            Education: Education Goals: Education classes will be provided on a weekly basis, covering required topics. Participant will state understanding/return demonstration of topics presented.  Learning Barriers/Preferences:  Learning Barriers/Preferences - 02/13/21 1003       Learning Barriers/Preferences   Learning Barriers Hearing;Sight   Pt wears glasses: bilateral hearing aids   Learning Preferences Audio;Computer/Internet;Group Instruction;Individual Instruction;Pictoral;Skilled Demonstration;Verbal Instruction;Video;Written Material             Education Topics: Hypertension, Hypertension Reduction -Define heart disease and high blood pressure. Discus how high blood pressure affects the body and ways to reduce high blood pressure.   Exercise and Your Heart -Discuss why it is important to exercise, the FITT principles of exercise, normal and abnormal responses to exercise, and how to exercise  safely.   Angina -Discuss definition of angina, causes of angina, treatment of angina, and how to decrease risk of having angina.   Cardiac Medications -Review what the following cardiac medications are used for, how they affect the body, and side effects that may occur when taking the medications.  Medications include  Aspirin, Beta blockers, calcium channel blockers, ACE Inhibitors, angiotensin receptor blockers, diuretics, digoxin, and antihyperlipidemics.   Congestive Heart Failure -Discuss the definition of CHF, how to live with CHF, the signs and symptoms of CHF, and how keep track of weight and sodium intake.   Heart Disease and Intimacy -Discus the effect sexual activity has on the heart, how changes occur during intimacy as we age, and safety during sexual activity.   Smoking Cessation / COPD -Discuss different methods to quit smoking, the health benefits of quitting smoking, and the definition of COPD.   Nutrition I: Fats -Discuss the types of cholesterol, what cholesterol does to the heart, and how cholesterol levels can be controlled.   Nutrition II: Labels -Discuss the different components of food labels and how to read food label   Heart Parts/Heart Disease and PAD -Discuss the anatomy of the heart, the pathway of blood circulation through the heart, and these are affected by heart disease.   Stress I: Signs and Symptoms -Discuss the causes of stress, how stress may lead to anxiety and depression, and ways to limit stress.   Stress II: Relaxation -Discuss different types of relaxation techniques to limit stress.   Warning Signs of Stroke / TIA -Discuss definition of a stroke, what the signs and symptoms are of a stroke, and how to identify when someone is having stroke.   Knowledge Questionnaire Score:  Knowledge Questionnaire Score - 02/13/21 1003       Knowledge Questionnaire Score   Pre Score 22/28             Core Components/Risk  Factors/Patient Goals at Admission:  Personal Goals and Risk Factors at Admission - 02/13/21 1004       Core Components/Risk Factors/Patient Goals on Admission    Weight Management Weight Maintenance    Tobacco Cessation Yes    Number of packs per day 2-3 cigarettes per day    Intervention Assist the participant in steps to quit. Provide individualized education and counseling about committing to Tobacco Cessation, relapse prevention, and pharmacological support that can be provided by physician.;Advice worker, assist with locating and accessing local/national Quit Smoking programs, and support quit date choice.    Expected Outcomes Short Term: Will demonstrate readiness to quit, by selecting a quit date.;Short Term: Will quit all tobacco product use, adhering to prevention of relapse plan.;Long Term: Complete abstinence from all tobacco products for at least 12 months from quit date.    Improve shortness of breath with ADL's Yes    Intervention Provide education, individualized exercise plan and daily activity instruction to help decrease symptoms of SOB with activities of daily living.    Expected Outcomes Short Term: Improve cardiorespiratory fitness to achieve a reduction of symptoms when performing ADLs    Heart Failure Yes    Intervention Provide a combined exercise and nutrition program that is supplemented with education, support and counseling about heart failure. Directed toward relieving symptoms such as shortness of breath, decreased exercise tolerance, and extremity edema.    Expected Outcomes Improve functional capacity of life;Short term: Attendance in program 2-3 days a week with increased exercise capacity. Reported lower sodium intake. Reported increased fruit and vegetable intake. Reports medication compliance.;Short term: Daily weights obtained and reported for increase. Utilizing diuretic protocols set by physician.;Long term: Adoption of self-care skills and  reduction of barriers for early signs and symptoms recognition and intervention leading to self-care maintenance.    Hypertension Yes    Intervention Provide education on  lifestyle modifcations including regular physical activity/exercise, weight management, moderate sodium restriction and increased consumption of fresh fruit, vegetables, and low fat dairy, alcohol moderation, and smoking cessation.;Monitor prescription use compliance.    Expected Outcomes Short Term: Continued assessment and intervention until BP is < 140/64mm HG in hypertensive participants. < 130/66mm HG in hypertensive participants with diabetes, heart failure or chronic kidney disease.;Long Term: Maintenance of blood pressure at goal levels.    Lipids Yes    Intervention Provide education and support for participant on nutrition & aerobic/resistive exercise along with prescribed medications to achieve LDL 70mg , HDL >40mg .    Expected Outcomes Short Term: Participant states understanding of desired cholesterol values and is compliant with medications prescribed. Participant is following exercise prescription and nutrition guidelines.;Long Term: Cholesterol controlled with medications as prescribed, with individualized exercise RX and with personalized nutrition plan. Value goals: LDL < 70mg , HDL > 40 mg.             Core Components/Risk Factors/Patient Goals Review:   Goals and Risk Factor Review     Row Name 02/19/21 1545 03/21/21 1725           Core Components/Risk Factors/Patient Goals Review   Personal Goals Review Weight Management/Obesity;Heart Failure;Hypertension;Lipids;Tobacco Cessation Weight Management/Obesity;Heart Failure;Hypertension;Lipids;Tobacco Cessation      Review [name] started exercise at cardiac rehab on 02/19/21 and did fair with exercise. [name] did experience some orthostatic hypotenson. the PA was notfied. Enc to hydrate properly [name] has been doing well with exercise at cardiac rehab. Vital  signs have been stable. [name] has not felt dizzy during exercise on her recent sessions      Expected Outcomes [name] will continue to partcipate in phase 2 cardiac rehab for exercise nutrition and lifestyle modifications. [name] will continue to partcipate in phase 2 cardiac rehab for exercise nutrition and lifestyle modifications.               Core Components/Risk Factors/Patient Goals at Discharge (Final Review):   Goals and Risk Factor Review - 03/21/21 1725       Core Components/Risk Factors/Patient Goals Review   Personal Goals Review Weight Management/Obesity;Heart Failure;Hypertension;Lipids;Tobacco Cessation    Review [name] has been doing well with exercise at cardiac rehab. Vital signs have been stable. [name] has not felt dizzy during exercise on her recent sessions    Expected Outcomes [name] will continue to partcipate in phase 2 cardiac rehab for exercise nutrition and lifestyle modifications.             ITP Comments:  ITP Comments     Row Name 02/13/21 0845 02/19/21 1538 03/21/21 1724       ITP Comments Dr Fransico Him MD, Medical Director 30 Day ITP Review. [name] started exercise on 02/19/21 and did fair with exercise. [name] did report some dizziness. 30 Day ITP Review. [name] has good attendance and participation in phase 2 cardiac rehab.              Comments: See ITP comments.Barnet Pall, RN,BSN 03/21/2021 5:28 PM

## 2021-03-21 NOTE — Progress Notes (Signed)
Incomplete Session Note  Patient Details  Name: Joy Williams MRN: [id number] Date of Birth: [birth year] Referring Provider:   Flowsheet Row CARDIAC REHAB PHASE II ORIENTATION from 02/13/2021 in [facility]  Referring Provider Gwyndolyn Kaufman, MD       [name] did not complete her rehab session.  [name] reported having a runny nose and cold symptoms. Patient advised not to exercise and to take a home COVID test. Patient states understanding and knows to be symptom free before returning to exercise at cardiac rehab.Barnet Pall, RN,BSN 03/21/2021 10:11 AM

## 2021-03-23 ENCOUNTER — Encounter (HOSPITAL_COMMUNITY)
Admission: RE | Admit: 2021-03-23 | Discharge: 2021-03-23 | Disposition: A | Discharge status: Home / Self Care | Payer: Medicare Other | Source: Ambulatory Visit | Attending: Cardiology | Admitting: Cardiology

## 2021-03-23 ENCOUNTER — Other Ambulatory Visit: Payer: Self-pay

## 2021-03-23 DIAGNOSIS — I5181 Takotsubo syndrome: Secondary | ICD-10-CM

## 2021-03-23 DIAGNOSIS — I214 Non-ST elevation (NSTEMI) myocardial infarction: Secondary | ICD-10-CM | POA: Diagnosis not present

## 2021-03-26 ENCOUNTER — Encounter (HOSPITAL_COMMUNITY)
Admission: RE | Admit: 2021-03-26 | Discharge: 2021-03-26 | Disposition: A | Discharge status: Home / Self Care | Payer: Medicare Other | Source: Ambulatory Visit | Attending: Cardiology | Admitting: Cardiology

## 2021-03-26 ENCOUNTER — Telehealth: Payer: Self-pay | Admitting: Internal Medicine

## 2021-03-26 ENCOUNTER — Telehealth (INDEPENDENT_AMBULATORY_CARE_PROVIDER_SITE_OTHER): Payer: Medicare Other | Admitting: Internal Medicine

## 2021-03-26 ENCOUNTER — Other Ambulatory Visit: Payer: Self-pay

## 2021-03-26 DIAGNOSIS — I6523 Occlusion and stenosis of bilateral carotid arteries: Secondary | ICD-10-CM

## 2021-03-26 DIAGNOSIS — I214 Non-ST elevation (NSTEMI) myocardial infarction: Secondary | ICD-10-CM | POA: Diagnosis not present

## 2021-03-26 DIAGNOSIS — I5181 Takotsubo syndrome: Secondary | ICD-10-CM

## 2021-03-26 DIAGNOSIS — J849 Interstitial pulmonary disease, unspecified: Secondary | ICD-10-CM | POA: Diagnosis not present

## 2021-03-26 NOTE — Patient Instructions (Addendum)
    ICD-10-CM   1. ILD (interstitial lung disease) ([place])  J84.9     2. History of Raynaud's syndrome  Z86.79     3. ANA positive  R76.8     4. Hiatal hernia  K44.9     5. DOE (dyspnea on exertion)  R06.00     6. History of smoking 25-50 pack years  Z87.891     7. 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 Chase Caller to monitor nintedanib uptake -8-12 weeks to spirometry and DLCO  -Return to see Dr. Chase Caller face-to-face in 8-12 weeks for 30-minute encounter

## 2021-03-26 NOTE — Progress Notes (Signed)
Subjective:    Patient ID: Joy Williams, female    DOB: 07-30-1950    MRN: [id number]    Brief patient profile:  70 yowf  Quit smoking 02/2019 yowf  Quit smoking 02/2019   dx with CREST 2004 in [place] moved to [place] from [place] Nov 2015  self referred to pulmonary clinic 10/03/14  for copd though last set of pfts from 2014 did not show copd    History of Present Illness  10/03/2014 1st  Pulmonary office visit/ W[facility]  On spiriva chronically  Chief Complaint  Patient presents with   Pulmonary Consult    Self referral. Pt moved from [place] to [facility] in Nov 2015. 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  She c/o "smoker's cough"- proc with clear sputum.  Her breathing is overall doing well.    cough is the main problem - can still do 5k racing power walk s stopping ie Not limited by breathing from desired activities  Takes fish oil for heart regurgitation and it "works great"  Cough is daily , def Worse in am's year round, present x > 5 y, worse since in [place]  rec Stop spiriva and start >>> Dulera 100 Take 2 puffs first thing in am and then another 2 puffs about 12 hours later.  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 Surgical resection with wedge resection and mediastinal lymph node sampling 12/2014 Records  suggest pneumonectomy done but clearly this is an error      06/07/2015  Extended f/u ov/Wert re: transition of care  sob s/p L lung resection maint rx dulera 100 2bid  Chief Complaint  Patient presents with   Follow-up    Pt c/o increased DOE since had lung surgery for lung ca in Sept 2016.   doe x  MMRC2 = can't walk a nl pace on a flat grade s sob/ assoc with day > noct cough/ no assoc dysphagia/ has not had any RT  rec For cough > mucinex dm 1200 mg every 12 hours as needed Continue dulera 100 Take 2 puffs first thing in am and then another 2 puffs about 12 hours later.  Work on inhaler technique:  The key is to stop smoking completely before smoking completely stops you!  Try prilosec otc 34m  Take 30-60 min before first meal of the day and Pepcid ac (famotidine) 20 mg one @  bedtime until cough is completely gone for at least a week without the need for cough suppression GERD diet    09/17/2016  f/u ov/Wert re: AB on dulera 100 2bid but insurance no longer covering  Chief Complaint  Patient presents with   Follow-up    Pt states her breathing is doing well. She is requesting refill on Dulera. She states she has  AM cough if she forgets to take the PM dose of Dulera. She does not have an albuterol inhaler.   working toward a 5 k this month s/p  MVR 06/27/16 rec  Work on inhaler technique:  Continue symbicort 80 Take 2 puffs first thing in am and then another 2 puffs about 12 hours  lateer    05/25/2018  f/u ov/Wert re: AB  Was on symb 80 2 bid doing ok  Chief Complaint  Patient presents with   Follow-up    Insurance stopped covering symbicort and so she has been out of med x 4 days. She states her breathing has been "okay".  She does not have a rescue inhaler.   Dyspnea:  Not limited by breathing from desired activities  As long as on symb Cough: not a problem Sleeping: fine bed flat  SABA use: none 02: none  rec Advair 115  Take 2 puffs first thing in am and then another 2  puffs about 12 hours later.  If you are not satisfied for any reason, please return your drug formulary to pick an alternative   07/09/2018  f/u ov/Wert re:  Copd 0/ AB  Chief Complaint  Patient presents with   Follow-up    discuss inhalers per 2/10 phone note.  advair is not working as well as the [employer].    Dyspnea:  More sob on advair vs symbicort Cough: some worse on advair/ dry hacking  Sleeping: up at least once cough SABA use: never uses  02: none  rec Stop advair and start trelegy one click each am x 2 good drags and use arm and hammer tooth paste and gargle and spit out If not happy with trelegy then call for PA on symbicort The key is to stop smoking completely before smoking completely stops you!     08/05/2019  f/u ov/Wert re: AB /  Quit smoking oct  2020/ planning to  Begin  pulmonary rehab/ last covid 2 weeks prior to [place] Complaint  Patient presents with   Follow-up    F/U after PFT. States her breathing has been stable since last visit. Will be starting pulmonary rehab soon.   Dyspnea:  trelegy has been the best rx yet now = MMRC1 = can walk nl pace, flat grade, can't hurry or go uphills or steps s sob   Cough: not now that quit smoking  Sleeping: better now s cough or sob  SABA use: none  02: none  rec No change rx/ f/u one year   09/12/2020  f/u ov/Wert re:  AB/ Scleroderma assoc UIP - did quit smoking / maint trelelgy  Chief Complaint  Patient presents with   Follow-up    Sob-better, had mini stroke last mth., pnd  Dyspnea:  MMRC1 = can walk nl pace, flat grade, can't hurry or go uphills or steps s sob   Cough: just with pollen Sleeping: no resp problems  SABA use: none  02: none  Covid status:   vax x 3 moderna    No obvious day to day or daytime variability or assoc excess/ purulent sputum or mucus plugs or hemoptysis or cp or chest tightness, subjective wheeze or overt sinus or hb symptoms.   Sleeping  without nocturnal  or early am  exacerbation  of respiratory  c/o's or need for noct saba. Also denies any obvious fluctuation of symptoms with weather or environmental changes or other aggravating or alleviating factors except as outlined above  No unusual exposure hx or h/o childhood pna/ asthma or knowledge of premature birth.        OV 10/12/2020  Subjective:  Patient ID: Joy Williams, female , DOB: January 25, 1951 , age 71 y.o. , MRN: [id number] , ADDRESS: [address] [place] [address] PCP Lorrene Reid, PA-C Patient Care Team: Lorrene Reid, PA-C as PCP - General Freada Bergeron, MD as PCP - Cardiology (Cardiology) Dorothy Spark, MD (Inactive) as Consulting Physician (Cardiology) Tanda Rockers, MD as Consulting Physician (Pulmonary Disease) Hurley Cisco, MD as Consulting Physician (Rheumatology) Lerry Paterson, MD as Referring Physician Lissa Morales, MD as Referring Physician (Internal Medicine) [name], MD as Consulting Physician (Infectious Diseases)  This Provider for this visit: Treatment Team:  Attending Provider: Brand Males, MD    10/12/2020 -   Chief Complaint  Patient presents with   Consult    Pt is being referred to Dr. Chase Caller by Dr. Melvyn Novas.  Pt states that she has an occ cough but denies any other complaints.   Transfer of care from Dr. Christinia Gully to Dr. Chase Caller in the ILD center.  History is gained from talking to the patient and review of the medical records including [place] and visualization of 2016 or 2017 CT scan of the chest.  HPI Joy Williams 70 y.o. -used to live in [place].  She worked in the [employer] for [employer].  In 2016 she retired and came to [place].  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  Her primary care physician thought she might have crest syndrome because she had Raynaud's along with allergic cases and she had  edema in her fingers.  After she moved here apparently she saw a rheumatologist [review of the chart indicates is Dr. Charlestine Night who has since retired] and was told that she might not have scleroderma.  She has been on observation therapy from a rheumatological standpoint.  In fact never seen a rheumatologist.  Then in 2016 she had early stage non-small cell lung cancer for which she underwent wedge resection left upper lobe.  She is followed up with [facility] since then.  She has had annual CT scans of the chest.  Most recent 1 was in May 2021.  She believes and per record review that she is in complete remission and I believe she might be discharged from follow-up there.  She tells me that at baseline she has very mild shortness of breath that is stable as documented below.  She also has amount of cough that is also documented below.  Is not clear that this is necessarily worse.  She is maintained on inhaled corticosteroid and long-acting beta agonist.  She is not sure if this is because of her diagnose of COPD other than the fact that she is on it for respiratory symptoms.  Most recently when she followed up with Dr. Gustavus Bryant review of the records revealed that there was interstitial lung disease findings on the CT scan of the chest in May 2021 at [facility].  In my review of the notes it appears that the pattern might have changed.  Infection was present even in 2016 2017 and currently the pattern might be changed.  She is definitely not feeling it.  Her symptoms are minimal and her walking desaturation test is normal.  Because of the ILD changes she has been referred to the ILD center  CT Chest data 09/23/19 at [place] (no image avail)  Interface, Rad Results In - 09/23/2019 11:22 AM EDT  Formatting of this note might be different from the original.  CT Chest   Indication:  Non-small cell lung cancer, monitor, C34.92 Malignant neoplasm  of unspecified part of left bronchus or lung (CMS-HCC).    Comparison Exams:  06/10/2019   Protocol:  Chest CT WO Protocol.  Contiguous 0.625 mm axial images with 5  mm axial, 3 mm coronal, and 3 mm sagittal reconstructions were obtained  from the neck base through the upper abdomen without IV contrast material.  In addition, 3-D maximal intensity projection (MIP) reconstructions were  performed to potentially increase the sensitivity for the detection of  pulmonary nodules.   Findings:   The central airways are patent without endobronchial mass. The patient is  status post left upper lobe wedge resection which is stable in appearance.  Status post left lower lobe wedge resection with a similar appearance of  peripheral consolidation and groundglass lung with traction bronchiectasis  adjacent to the staple line. Redemonstrated findings of lower lobe  predominant pulmonary fibrosis with subpleural reticulation and probable  UIP pattern. Stable sub-6 mm pulmonary nodules and nodular opacities are  similar, index 5 mm pulmonary nodule in the anterior pleura of the right  upper lobe (0-998). New left lower lobe nodule on image 4-300 is favored to  represent endoluminal debris. No effusion or pneumothorax. High density  focus along the right diaphragm may represent pleural calcification.  Scattered calcified and noncalcified mediastinal and hilar lymph nodes,  measuring up to 1.3 cm in the left lower paratracheal region   Status post mitral valve are clear with mild cardiomegaly and coronary  artery calcifications in the LAD. No pericardial effusion.   There is a small hiatal hernia no aggressive osseous lesions.   The thyroid gland is unremarkable. No axillary adenopathy or chest wall  abnormality.   Impression:   Stable versus slightly increased in subpleural consolidation in the left  lower lobe adjacent to the surgical staple line. Given curvilinear  appearance on sagittal imaging, findings are favored to represent confluent  fibrosis  over malignancy. Consider additional follow-up chest CT in 3-6  months.   Additional nodules/nodular opacities are similar. Attention on follow-up.   Electronically Reviewed by:  Cheryl Flash, MD, [facility]  Electronically Reviewed on:  09/23/2019 11:14 AM    No results found.   Dec 30 2014 removed LUL lung tissue with 2 tumors/ and L vc paralyzed ever since - last chemo 05/06/15   Last [facility] onc note:  Date of diagnosis: 12/2014 Diagnosis: NSCLC (Synchronous primaries) Histology: Adenocarcinoma Mutation status: EGFR pending, ALK, ROS1 negative Stage: Adenocarcinoma: T1aN0M0 Squamous T2aN0M0 Prior therapy: 1. Surgical resection with wedge resection and mediastinal lymph node s   OV 02/15/2021  Subjective:  Patient ID: Joy Williams, female , DOB: 10-May-1951 , age 70 y.o. , MRN: [id number] , ADDRESS: [address] [place] [address] PCP Lorrene Reid, PA-C Patient Care Team: Lorrene Reid, PA-C as PCP - General Freada Bergeron, MD as PCP - Cardiology (Cardiology) Tanda Rockers, MD as Consulting Physician (Pulmonary Disease) Hurley Cisco, MD as Consulting Physician (Rheumatology) Lerry Paterson, MD as Referring Physician Lissa Morales, MD as Referring Physician (Internal Medicine) [name], MD as Consulting Physician (Infectious Diseases) [name], Rounding, MD as Rounding Team (Cardiology)  This Provider for this visit: Treatment Team:  Attending Provider: Brand Males, MD    02/15/2021 -  Chief Complaint  Patient presents with   Follow-up    PFT performed today. Pt states she has been doing okay since last visit and states she is about the same.   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 -presents for follow-up.  Last seen in May 2022.  At the time initiated ILD work-up.  Her ANA is strongly positive.  She has history of Raynaud's.  So we sent her to rheumatology.  She saw Dr Estanislado Pandy only recently.  First note reviewed.  No specific connective tissue disease identified. AVISE was sent out and they texted me today saying the results were negative.  Patient continues to overall feel the same.  She had pulmonary function test that shows decline over time.  Approximately 6 %/year over the last 18 months.  This is when FVC and DLCO are averaged.  She is definitely not feeling the difference.  Of note she had a Takotsubo cardiomyopathy in June 2022.  By July 2022 her echo normalized.  She will consult cardiac rehabilitation soon.  She continues to smoke but has reduced.  She nondistended importance of quitting smoking.  She will have a high-dose flu shot today.  We discussed COVID booster by Valent.       IMPRESSION: 1. There is mild pulmonary fibrosis in a pattern with apical to basal gradient, featuring irregular peripheral interstitial opacity, septal thickening, traction bronchiectasis, and subpleural bronchiolectasis at the lung basis without clear evidence of honeycombing. Fibrotic findings are worsened over time on prior examinations dating back to 11/16/2014. Findings are categorized as probable UIP per consensus guidelines: Diagnosis of Idiopathic Pulmonary Fibrosis: An Official ATS/ERS/JRS/ALAT Clinical Practice Guideline. [place], Iss 5, [phone], Jan 18 2017. 2. Redemonstrated postoperative findings of anterior left upper lobe and dependent left lower lobe wedge resections. No evidence of malignant recurrence in the chest. 3. Coronary artery disease.   Aortic Atherosclerosis  (ICD10-I70.0).     Electronically Signed   By: Eddie Candle M.D.   OnResults for DAYLEE, DELAHOZ (MRN [id number]) as of 10/20/2020 16:40  Ref. Range 10/12/2020 16:37  Anti Nuclear Antibody (ANA) Latest Ref Range: NEGATIVE  POSITIVE (A)  ANA Pattern 1 Unknown Nuclear, Nucleolar (A)  ANA Titer 1 Latest Units: titer 6:4,332 (H)  Cyclic Citrullin Peptide Ab Latest Units: UNITS <16  ds DNA Ab Latest Units: IU/mL <1  Myeloperoxidase Abs Latest Units: AI <1.0  Serine Protease 3 Latest Units: AI <1.0  RA Latex Turbid. Latest Ref Range: <14 IU/mL <14  SSA (Ro) (ENA) Antibody, IgG Latest Ref Range: <1.0 NEG AI <1.0 NEG  SSB (La) (ENA) Antibody, IgG Latest Ref Range: <1.0 NEG AI <1.0 NEG  Scleroderma (Scl-70) (ENA) Antibody, IgG Latest Ref Range: <1.0 NEG AI <1.0 NEG      OV 03/26/2021  Subjective:  Patient ID: Joy Williams, female , DOB: [birth year] , age 34 y.o. , MRN: [id number] , ADDRESS: [address] [place] [address] PCP Lorrene Reid, PA-C Patient Care Team: Lorrene Reid, PA-C as PCP - General Freada Bergeron, MD as PCP - Cardiology (Cardiology) Tanda Rockers, MD as Consulting Physician (  Pulmonary Disease) Hurley Cisco, MD as Consulting Physician (Rheumatology) Lerry Paterson, MD as Referring Physician Lissa Morales, MD as Referring Physician (Internal Medicine) [name], MD as Consulting Physician (Infectious Diseases) [name], Rounding, MD as Rounding Team (Cardiology)  This Provider for this visit: Treatment Team:  Attending Provider: Brand Males, MD 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    HPI Joy [id number]. - strted ofev 03/22/21. No diarrhea. Occ mild headache. Noticing mild change in appetite which she is keeping track of it. Went to rehab today and wanted to do video visit instead of face to face.  We discussed participation in clinical research.  She is very interested.  She will start off with doing the ILD-Pro registry.  I will have the research team send her a consent form.  If there is any interventional trial in the future then we will contact her about it.  For now we discussed basic elements of being a cSport and exercise psychologist  And this included   1. Scientific Purpose  Clinical research is designed to produce generalizable knowledge and to answer questions about the safety and efficacy of intervention(s) under study in order to determine whether or not they may be useful for the care of future patients.  2. Study Procedures  Participation in a trial may involve procedures or tests, in addition to the intervention(s) under study, that are intended only or primarily to generate scientific knowledge and that are otherwise not necessary for patient care.      SYMPTOM SCALE - ILD 10/12/2020   O2 use ra  Shortness of Breath 0 -> 5 scale with 5 being worst (score 6 If unable to do)  At rest 0  Simple tasks - showers, clothes change, eating, shaving 0  Household (dishes, doing bed, laundry) 1  Shopping 2  Walking level at own pace 1  Walking up Stairs 2  Total (30-36) Dyspnea Score 6  How bad is your cough? 2.5  How bad is your fatigue 2.5  How bad is nausea 0  How bad is vomiting?  0  How bad is diarrhea? 0  How bad is anxiety? 1  How bad is depression 0       Simple office walk 185 feet x  3 laps goal with forehead probe 10/12/2020   O2 used ra  Number laps completed 3  Comments about pace avg  Resting Pulse Ox/HR 100% and 72/min  Final  Pulse Ox/HR 100% and 95/min  Desaturated </= 88% no  Desaturated <= 3% points no  Got Tachycardic >/= 90/min yes  Symptoms at end of test No complaints  Miscellaneous comments x    CT Chest data  No results found.  Results for RCHAKA, BOYSON(MRN [id number] as of 03/26/2021 14:11  Ref. Range 11/30/2020 11:10  AST Latest Ref Range: 0 - 40 IU/L 23  ALT Latest Ref Range: 0 - 32 IU/L 12    PFT  PFT Results Latest Ref Rng & Units 02/15/2021 08/05/2019 03/12/2016 11/14/2014  FVC-Pre L 2.35 2.52 2.67 2.97  FVC-Predicted Pre % 71 74 76  84  FVC-Post L 2.41 2.52 2.61 3.00  FVC-Predicted Post % 72 74 75 85  Pre FEV1/FVC % % 78 81 78 78  Post FEV1/FCV % % 81 82 80 78  FEV1-Pre L 1.84 2.04 2.09 2.33  FEV1-Predicted Pre % 73 79 78 86  FEV1-Post L 1.96 2.06 2.09 2.33  DLCO uncorrected ml/min/mmHg 12.17 13.77 13.52 14.61  DLCO UNC% % 57 64 48 52  DLCO corrected ml/min/mmHg 12.17 13.77 14.73 -  DLCO COR %Predicted % 57 64 53 -  DLVA Predicted % 86 87 84 65  TLC L 3.83 4.22 4.18 4.88  TLC % Predicted % 70 77 77 89  RV % Predicted % 64 74 71 88       has a past medical history of Allergy, Asthma, Carotid artery disease ([place]) (2019), Chronic diastolic congestive heart failure (HCC), 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) (10/24/2020), and Telangiectasia.   reports that she has been smoking e-cigarettes and cigarettes. She started smoking about 37 years ago. She has a 18.00  pack-year smoking history. She has never used smokeless tobacco.  Past Surgical History:  Procedure Laterality Date   BACK SURGERY     x 3  Disectomy   BREAST BIOPSY Left    CARDIAC CATHETERIZATION N/A 03/08/2016   Procedure: Right/Left Heart Cath and Coronary Angiography;  Surgeon: Sherren Mocha, MD;  Location: [facility];  Service: Cardiovascular;  Laterality: N/A;   CLAVICLE SURGERY Left 2013   plate to left collar bone   ENDARTERECTOMY Right 08/09/2020   Procedure: RIGHT CAROTID ENDARTERECTOMY;  Surgeon: Waynetta Sandy, MD;  Location: [facility];  Service: Vascular;  Laterality: Right;   LAPAROSCOPY     ? reason-age 86    LEFT HEART CATH AND CORONARY ANGIOGRAPHY N/A 02/25/2019   Procedure: LEFT HEART CATH AND CORONARY ANGIOGRAPHY;  Surgeon: Jettie Booze, MD;  Location: [facility];  Service: Cardiovascular;  Laterality: N/A;   LEFT HEART CATH AND CORONARY ANGIOGRAPHY N/A 10/24/2020   Procedure: LEFT HEART CATH AND CORONARY ANGIOGRAPHY;  Surgeon: Jettie Booze, MD;  Location: [facility];  Service: Cardiovascular;  Laterality: N/A;   LUNG CANCER SURGERY     MITRAL VALVE REPAIR Right 06/27/2016   Procedure: MINIMALLY INVASIVE MITRAL VALVE REPLACEMENT;  Surgeon: Rexene Alberts, MD;  Location: [facility];  Service: Open Heart Surgery;  Laterality: Right;   TEE WITHOUT CARDIOVERSION N/A 02/22/2016   Procedure: TRANSESOPHAGEAL ECHOCARDIOGRAM (TEE);  Surgeon: Dorothy Spark, MD;  Location: [name];  Service: Cardiovascular;  Laterality: N/A;   TEE WITHOUT CARDIOVERSION N/A 06/27/2016   Procedure: TRANSESOPHAGEAL ECHOCARDIOGRAM (TEE);  Surgeon: Rexene Alberts, MD;  Location: [facility];  Service: Open Heart Surgery;  Laterality: N/A;   TONSILLECTOMY     VIDEO ASSISTED THORACOSCOPY (VATS)/WEDGE RESECTION Left 12/30/2014   Bronchoscopy, Mediastinoscopy, Left VATS for Wedge resection LUL x2 adn LLL x1 - Dr. Elenor Quinones at [facility] Left 2017    injected with botox   wrist surgery Left 2015   plate to wrist     Allergies  Allergen Reactions  Penicillins Other (See Comments)    Unknown, occurred as a child   Bee Venom Swelling    Severe swelling at the sting site.    Immunization History  Administered Date(s) Administered   Fluad Quad(high Dose 65+) 02/18/2019, 02/15/2021   H1N1 04/13/2008   Influenza Split 02/18/2015   Influenza, High Dose Seasonal PF 02/20/2017, 04/27/2018   Influenza,inj,Quad PF,6+ Mos 03/16/2015   Influenza-Unspecified 04/12/2008, 02/18/2011   Moderna Sars-Covid-2 Vaccination 06/21/2019, 07/18/2019, 01/25/2020   Pneumococcal Conjugate-13 01/22/2017   Tdap 09/26/2015   Zoster Recombinat (Shingrix) 05/10/2018, 03/03/2019    Family History  Problem Relation Age of Onset   Mitral valve prolapse Mother    Dementia Father    Prostate cancer Father    Mitral valve prolapse Sister    Mitral valve prolapse Brother    Healthy Son    Healthy Daughter    Colon cancer Neg Hx    Colon polyps Neg Hx    Rectal cancer Neg Hx    Esophageal cancer Neg Hx    Stomach cancer Neg Hx    Heart attack Neg Hx      Current Outpatient Medications:    acetaminophen (TYLENOL) 500 MG tablet, Take 1,000 mg by mouth every 6 (six) hours as needed for mild pain or headache., Disp: , Rfl:    Cholecalciferol (VITAMIN D) 125 MCG (5000 UT) CAPS, Take 5,000 Units by mouth daily., Disp: , Rfl:    clopidogrel (PLAVIX) 75 MG tablet, Take 1 tablet (75 mg total) by mouth daily., Disp: 90 tablet, Rfl: 3   EPINEPHrine (EPIPEN 2-PAK) 0.3 mg/0.3 mL IJ SOAJ injection, Inject 0.3 mg into the muscle as needed for anaphylaxis., Disp: 1 each, Rfl: 1   ferrous sulfate 325 (65 FE) MG tablet, Take 1 tablet (325 mg total) by mouth daily., Disp: 30 tablet, Rfl: 6   hydrOXYzine (ATARAX/VISTARIL) 25 MG tablet, TAKE 1/2 TO 1 TABLET BY MOUTH TWICE DAILY AS NEEDED FOR ITCHING, Disp: 45 tablet, Rfl: 1   levothyroxine (SYNTHROID) 175 MCG tablet, TAKE  1 TABLET BY MOUTH EVERY DAY BEFORE BREAKFAST, Disp: 90 tablet, Rfl: 1   MELATONIN PO, Take 1 tablet by mouth at bedtime as needed (sleep)., Disp: , Rfl:    Multiple Vitamins-Minerals (MULTIVITAMIN WITH MINERALS) tablet, Take 1 tablet by mouth daily., Disp: , Rfl:    nicotine (NICODERM CQ - DOSED IN MG/24 HR) 7 mg/24hr patch, Place 1 patch (7 mg total) onto the skin daily., Disp: 28 patch, Rfl: 0   rosuvastatin (CRESTOR) 20 MG tablet, Take 1 tablet (20 mg total) by mouth daily. (Patient taking differently: Take 20 mg by mouth every evening.), Disp: 90 tablet, Rfl: 0   TRELEGY ELLIPTA 100-62.5-25 MCG/INH AEPB, Inhale 1 puff into the lungs daily., Disp: , Rfl:    Turmeric 500 MG CAPS, Take 500 mg by mouth daily., Disp: , Rfl:    vitamin B-12 (CYANOCOBALAMIN) 1000 MCG tablet, Take 1 tablet (1,000 mcg total) by mouth daily., Disp: , Rfl:       Objective:   There were no vitals filed for this visit.  Estimated body mass index is 22.89 kg/m as calculated from the following:   Height as of 03/06/21: 5' 6.5" (1.689 m).   Weight as of 03/06/21: 144 lb (65.3 kg).  '@WEIGHTCHANGE' @  There were no vitals filed for this visit.   Physical Exam Look normal.  Pleasant on conversation alert and oriented x3 on the video visit      Assessment:  ICD-10-CM   1. ILD (interstitial lung disease) ([place])  J84.9          Plan:     Patient Instructions     ICD-10-CM   1. ILD (interstitial lung disease) ([place])  J84.9     2. History of Raynaud's syndrome  Z86.79     3. ANA positive  R76.8     4. Hiatal hernia  K44.9     5. DOE (dyspnea on exertion)  R06.00     6. History of smoking 25-50 pack years  Z87.891     7. 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 RChase Callerto monitor nintedanib uptake -8-12 weeks to spirometry and DLCO  -Return to see Dr. RChase Callerface-to-face in 8-12 weeks for 30-minute encounter   SIGNATURE    Dr. MBrand Males M.D., F.C.C.P,  Pulmonary and Critical Care Medicine Staff Physician, CBox ElderDirector - Interstitial Lung Disease  Program  [facility] [place] [id number] Pager: [phone] If no answer or between  15:00h - 7:00h: call [phone] Telephone: [phone]  2:24 PM 03/26/2021

## 2021-03-26 NOTE — Telephone Encounter (Signed)
This has been addressed in a telephone encounter. Please see that encounter for updates. Will close this encounter.

## 2021-03-26 NOTE — Telephone Encounter (Signed)
Called and spoke with patient who is requesting for her appt today at 2 pm to be a video visit. Per Dr. Chase Caller is he ok with that. Called to let patient know he has agreed. Appt has been switched and she has been arrived. Nothing further needed at this time.

## 2021-03-28 ENCOUNTER — Encounter (HOSPITAL_COMMUNITY): Payer: Medicare Other

## 2021-03-30 ENCOUNTER — Encounter (HOSPITAL_COMMUNITY)
Admission: RE | Admit: 2021-03-30 | Discharge: 2021-03-30 | Disposition: A | Discharge status: Home / Self Care | Payer: Medicare Other | Source: Ambulatory Visit | Attending: Cardiology | Admitting: Cardiology

## 2021-03-30 ENCOUNTER — Other Ambulatory Visit: Payer: Self-pay

## 2021-03-30 DIAGNOSIS — I5181 Takotsubo syndrome: Secondary | ICD-10-CM | POA: Diagnosis not present

## 2021-03-30 DIAGNOSIS — I214 Non-ST elevation (NSTEMI) myocardial infarction: Secondary | ICD-10-CM

## 2021-04-02 ENCOUNTER — Other Ambulatory Visit: Payer: Self-pay

## 2021-04-02 ENCOUNTER — Encounter (HOSPITAL_COMMUNITY)
Admission: RE | Admit: 2021-04-02 | Discharge: 2021-04-02 | Disposition: A | Discharge status: Home / Self Care | Payer: Medicare Other | Source: Ambulatory Visit | Attending: Cardiology | Admitting: Cardiology

## 2021-04-02 ENCOUNTER — Encounter (HOSPITAL_COMMUNITY): Payer: Medicare Other

## 2021-04-02 DIAGNOSIS — I214 Non-ST elevation (NSTEMI) myocardial infarction: Secondary | ICD-10-CM

## 2021-04-02 DIAGNOSIS — I5181 Takotsubo syndrome: Secondary | ICD-10-CM | POA: Diagnosis not present

## 2021-04-03 ENCOUNTER — Encounter: Payer: Self-pay | Admitting: Adult Health

## 2021-04-03 ENCOUNTER — Telehealth (INDEPENDENT_AMBULATORY_CARE_PROVIDER_SITE_OTHER): Payer: Medicare Other | Admitting: Adult Health

## 2021-04-03 DIAGNOSIS — G459 Transient cerebral ischemic attack, unspecified: Secondary | ICD-10-CM

## 2021-04-03 DIAGNOSIS — Z8673 Personal history of transient ischemic attack (TIA), and cerebral infarction without residual deficits: Secondary | ICD-10-CM

## 2021-04-03 DIAGNOSIS — I6521 Occlusion and stenosis of right carotid artery: Secondary | ICD-10-CM

## 2021-04-03 NOTE — Progress Notes (Signed)
[facility] [address] [place]. [place] [address] [phone]       STROKE FOLLOW UP NOTE  Joy Williams Date of Birth:  14-Oct-1950 Medical Record Number:  [id number]   Reason for visit: Stroke follow up  Virtual Visit via Video Note  Virtual visit completed through MyChart, a video enabled telemedicine application. Due to national recommendations of social distancing due to COVID-19, a virtual visit is felt to be most appropriate for this patient at this time. Reviewed limitations, risks, security and privacy concerns of performing a virtual visit and the availability of in person appointments. I also reviewed that there may be a patient responsible charge related to this service. The patient agreed to proceed.    Patient location: home Provider location: in office, [facility] Persons participating in this virtual visit: patient and provider     SUBJECTIVE:  CC: no concerns - question indication for visit today   HPI:   Update 04/03/2021 JM: Returns for overdue stroke follow-up via MyChart video visit.  Previously seen 7 months ago.  Stable from neurological standpoint without new or reoccurring stroke/TIA symptoms.  No residual stroke deficits.  Compliant on Plavix and Crestor -denies side effects.  Blood pressure routinely monitored at cardiac rehab 3x per week which has been stable (chronically on lower side).  Routinely being followed by cardiology for extensive cardiac history and currently participating in cardiac rehab.  Routinely followed by PCP.  No new concerns at this time.    History provided for reference purposes only Initial visit 08/23/2020 JM: Joy Williams is being seen for hospital follow-up accompanied by her daughter  Stable since discharge without new stroke/TIA symptoms Since CEA, she has experienced continued right-sided headaches describing it as sharp stabbing like pains that come and go but occur daily - was  seen by vascular surgery and felt residual headaches like postprocedural and started on Robaxin - can only take at night due to fatigue but reports limited benefit Use of tylenol during the day without benefit  She was evaluated by [facility] PT and released  She has remained on both aspirin and Plavix -denies associated side effects Cardiology recently discontinued atorvastatin and started Crestor  Compliant on Crestor -denies associated side effects Blood pressure today 111/63 - monitors at home which has been stable Reports complete tobacco cessation since discharge  No further concerns at this time  Stroke admission 08/06/2020 Joy Williams is a 70 y.o. female with history of chronic diastolic CHF, COPD, raynauds syndrome, telangectasia, severe mitral regurgitation, hypothyroidism, history of lung cancer who presented on 08/06/2020 with dizziness, left arm incoordination, and severe headache which lasted approximately 25 to 30 minutes except headache continued on presentation.  Personally reviewed hospitalization pertinent progress notes, lab work and imaging with summary provided.  Evaluated by [name] with MRI negative for acute stroke and possibly TIA with symptomatic right ICA stenosis of 74% although complicated migraine in DDx.  MRI showed multiple scattered remote lacunar infarcts involving bilateral cerebellar hemispheres, left thalamus and right caudate as well as left basal ganglia infarct which is new compared to prior imaging in 02/2018. VVS consulted for b/l ICA stenosis s/p R CEA by [name].  LDL 50 on atorvastatin 20 mg daily.  Other stroke risk factors include advanced age, tobacco use, EtOH use, CAD, severe mitral regurgitation s/p bioprosthetic MVR 2018 and chronic diastolic heart failure.  Evaluated by therapies and recommended outpatient PT for higher-level balance activities.  Possible TIA  with symptomatic right ICA of 65% stenosis. Complicated migraine is also in DDx.   Recurrent episode similar to 02/2018 of dizziness, imbalance, left arm incoordination, not in control and headache. CT head: No acute finding.  Age indeterminate lacunar infarct within the left basal ganglia. Known small infarcts within the left thalamus and left cerebellar. CTA head & neck: no LVO, Bulky calcified plaque about the proximal ICAs bilaterally, with associated stenoses of up to 65% on the right and 50% on the left. MRI: No acute intracranial abnormality.  Multiple scattered remote lacunar infarcts involving the bilateral cerebellar hemispheres, left thalamus, and right caudate. 2D Echo EF 65-70%, mild mitral valve regurgitation LDL 50 HgbA1c 5.6 VTE prophylaxis - Lovenox 40mg  daily No antithrombotic prior to admission, now on aspirin 81 mg daily and clopidogrel 75 mg daily. Will continue DAPT for 3 weeks and then ASA alone. 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 [facility] (had to have botox injection)   COPD (chronic obstructive pulmonary disease) ([place])    Family history of adverse reaction to anesthesia    Mother- very sensitive to medication   Heart murmur    MVP   Hypothyroidism    Lung cancer ([place]) 12/30/2014   Synchronous primaries:  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, each treated by wedge resection with post-op adjuvant [facility] at [facility]   MVP (mitral valve prolapse) 2018   s/p Minimally-Invasive Mitral Valve Replacement w/  [facility] Mitral bovine  bioprosthetic tissue valve (size 29mm, model # 7300TFX, serial # [id number])   PONV (postoperative nausea and vomiting)    Raynaud's syndrome    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   31 mm [facility] mitral bovine bioprosthetic tissue valve placed via right mini thoracotomy approach   Severe mitral regurgitation 11/15/2014   Shortness of breath dyspnea    with exertion   STD (sexually transmitted disease)    STEMI (ST elevation myocardial infarction) ([place]) 10/24/2020   Telangiectasia     PSH:  Past Surgical History:  Procedure Laterality Date   BACK SURGERY     x 3  Disectomy   BREAST BIOPSY Left    CARDIAC CATHETERIZATION N/A 03/08/2016   Procedure: Right/Left Heart Cath and Coronary Angiography;  Surgeon: Sherren Mocha, MD;  Location: [place] [facility];  Service: Cardiovascular;  Laterality: N/A;   CLAVICLE SURGERY Left 2013   plate to left collar bone   ENDARTERECTOMY Right 08/09/2020   Procedure: RIGHT CAROTID ENDARTERECTOMY;  Surgeon: Waynetta Sandy, MD;  Location: [place];  Service: Vascular;  Laterality: Right;   LAPAROSCOPY     ? reason-age 52    LEFT HEART CATH AND CORONARY ANGIOGRAPHY N/A 02/25/2019   Procedure: LEFT HEART CATH AND CORONARY ANGIOGRAPHY;  Surgeon: Jettie Booze, MD;  Location: [employer] [facility];  Service: Cardiovascular;  Laterality: N/A;  LEFT HEART CATH AND CORONARY ANGIOGRAPHY N/A 10/24/2020   Procedure: LEFT HEART CATH AND CORONARY ANGIOGRAPHY;  Surgeon: Jettie Booze, MD;  Location: [place] [facility];  Service: Cardiovascular;  Laterality: N/A;   LUNG CANCER SURGERY     MITRAL VALVE REPAIR Right 06/27/2016   Procedure: MINIMALLY INVASIVE MITRAL VALVE REPLACEMENT;  Surgeon: Rexene Alberts, MD;  Location: [place];  Service: Open Heart Surgery;  Laterality: Right;   TEE WITHOUT CARDIOVERSION N/A 02/22/2016   Procedure: TRANSESOPHAGEAL ECHOCARDIOGRAM (TEE);  Surgeon: Dorothy Spark, MD;   Location: [place];  Service: Cardiovascular;  Laterality: N/A;   TEE WITHOUT CARDIOVERSION N/A 06/27/2016   Procedure: TRANSESOPHAGEAL ECHOCARDIOGRAM (TEE);  Surgeon: Rexene Alberts, MD;  Location: [place];  Service: Open Heart Surgery;  Laterality: N/A;   TONSILLECTOMY     VIDEO ASSISTED THORACOSCOPY (VATS)/WEDGE RESECTION Left 12/30/2014   Bronchoscopy, Mediastinoscopy, Left VATS for Wedge resection LUL x2 adn LLL x1 - Dr. Elenor Quinones at [facility] INJECTION Left 2017   injected with botox   wrist surgery Left 2015   plate to wrist     Social History:  Social History   Socioeconomic History   Marital status: Divorced    Spouse name: Not on file   Number of children: Not on file   Years of education: 16   Highest education level: Not on file  Occupational History   Occupation: Retired  Tobacco Use   Smoking status: Every Day    Packs/day: 0.75    Years: 24.00    Pack years: 18.00    Types: E-cigarettes, Cigarettes    Start date: 05/21/1983   Smokeless tobacco: Never   Tobacco comments:    Currently smoking 3cigs per day as of 02/15/21  Vaping Use   Vaping Use: Former   Quit date: 05/20/2006  Substance and Sexual Activity   Alcohol use: Yes    Alcohol/week: 2.0 standard drinks    Types: 1 Shots of liquor, 1 Standard drinks or equivalent per week    Comment: occ   Drug use: No   Sexual activity: Not Currently    Partners: Male    Birth control/protection: Post-menopausal  Other Topics Concern   Not on file  Social History Narrative   Not on file   Social Determinants of Health   Financial Resource Strain: Low Risk    Difficulty of Paying Living Expenses: Not very hard  Food Insecurity: No Food Insecurity   Worried About Charity fundraiser in the Last Year: Never true   Arboriculturist in the Last Year: Never true  Transportation Needs: Unmet Transportation Needs   Lack of Transportation (Medical): No   Lack of Transportation (Non-Medical): Yes  Physical  Activity: Not on file  Stress: Not on file  Social Connections: Not on file  Intimate Partner Violence: Not on file    Family History:  Family History  Problem Relation Age of Onset   Mitral valve prolapse Mother    Dementia Father    Prostate cancer Father    Mitral valve prolapse Sister    Mitral valve prolapse Brother    Healthy Son    Healthy Daughter    Colon cancer Neg Hx    Colon polyps Neg Hx    Rectal cancer Neg Hx    Esophageal cancer Neg Hx    Stomach cancer Neg Hx    Heart attack Neg Hx     Medications:   Current  Outpatient Medications on File Prior to Visit  Medication Sig Dispense Refill   acetaminophen (TYLENOL) 500 MG tablet Take 1,000 mg by mouth every 6 (six) hours as needed for mild pain or headache.     Cholecalciferol (VITAMIN D) 125 MCG (5000 UT) CAPS Take 5,000 Units by mouth daily.     clopidogrel (PLAVIX) 75 MG tablet Take 1 tablet (75 mg total) by mouth daily. 90 tablet 3   EPINEPHrine (EPIPEN 2-PAK) 0.3 mg/0.3 mL IJ SOAJ injection Inject 0.3 mg into the muscle as needed for anaphylaxis. 1 each 1   ferrous sulfate 325 (65 FE) MG tablet Take 1 tablet (325 mg total) by mouth daily. 30 tablet 6   hydrOXYzine (ATARAX/VISTARIL) 25 MG tablet TAKE 1/2 TO 1 TABLET BY MOUTH TWICE DAILY AS NEEDED FOR ITCHING 45 tablet 1   levothyroxine (SYNTHROID) 175 MCG tablet TAKE 1 TABLET BY MOUTH EVERY DAY BEFORE BREAKFAST 90 tablet 1   MELATONIN PO Take 1 tablet by mouth at bedtime as needed (sleep).     Multiple Vitamins-Minerals (MULTIVITAMIN WITH MINERALS) tablet Take 1 tablet by mouth daily.     nicotine (NICODERM CQ - DOSED IN MG/24 HR) 7 mg/24hr patch Place 1 patch (7 mg total) onto the skin daily. 28 patch 0   rosuvastatin (CRESTOR) 20 MG tablet Take 1 tablet (20 mg total) by mouth daily. (Patient taking differently: Take 20 mg by mouth every evening.) 90 tablet 0   TRELEGY ELLIPTA 100-62.5-25 MCG/INH AEPB Inhale 1 puff into the lungs daily.     Turmeric 500 MG CAPS  Take 500 mg by mouth daily.     vitamin B-12 (CYANOCOBALAMIN) 1000 MCG tablet Take 1 tablet (1,000 mcg total) by mouth daily.     No current facility-administered medications on file prior to visit.    Allergies:   Allergies  Allergen Reactions   Penicillins Other (See Comments)    Unknown, occurred as a child   Bee Venom Swelling    Severe swelling at the sting site.      OBJECTIVE:  Physical Exam *Limited due to visit type*  General: well developed, well nourished, very pleasant elderly Caucasian female, seated, in no evident distress  Neurologic Exam Mental Status: Awake and fully alert.   Fluent speech and language.  Oriented to place and time. Recent and remote memory intact. Attention span, concentration and fund of knowledge appropriate. Mood and affect appropriate Motor: No evidence of weakness per pronator drift assessment Coordination: Rapid alternating movements normal in all extremities. Finger-to-nose and heel-to-shin performed accurately bilaterally        ASSESSMENT: Joy Williams is a 70 y.o. year old female presented with dizziness, left arm incoordination and headache on 08/06/2020 possibly TIA with symptomatic R ICA stenosis s/p R CEA. Vascular risk factors include hx of multiple strokes, bilateral ICA stenosis, HTN, HLD, tobacco use, EtOH use, CAD, NSTEMI 2020, severe MR s/p MVR 2018 and CHF.     PLAN:  TIA, hx of prior strokes :  Recovered well without residual deficit.   Continue clopidogrel 75 mg daily  and Crestor for secondary stroke prevention.   Discussed secondary stroke prevention measures and importance of close PCP follow up for aggressive stroke risk factor management  HTN: BP goal <130/90.  Stable on current regimen per PCP HLD: LDL goal <70.  Prior LDL 55 on Crestor 20 mg daily per PCP/cardiology Carotid stenosis: L ICA 50% stenosis, R ICA 65% stenosis s/p R CEA 08/09/2020.  Per VVS, repeat  carotid duplex 9 months post procedure.   Tobacco use: Encouraged continued tobacco cessation    Stable form stroke standpoint -routinely followed by PCP, cardiology and vascular surgery - follow up as needed   CC:  PCP: Lorrene Reid, PA-C    I spent 22 minutes of face-to-face and non-face-to-face time with patient via [place] video visit.  This included previsit chart review, lab review, study review, electronic health record documentation, patient education regarding prior TIA and prior stroke history, R CEA and follow-up with vascular surgery as advised, importance of managing stroke risk factors and answered all other questions to patients satisfaction  Frann Rider, AGNP-BC  [facility] [address] [place], [place] [address]  Phone [phone] Fax [phone] Note: This document was prepared with digital dictation and possible smart phrase technology. Any transcriptional errors that result from this process are unintentional.

## 2021-04-04 ENCOUNTER — Encounter (HOSPITAL_COMMUNITY)
Admission: RE | Admit: 2021-04-04 | Discharge: 2021-04-04 | Disposition: A | Discharge status: Home / Self Care | Payer: Medicare Other | Source: Ambulatory Visit | Attending: Cardiology | Admitting: Cardiology

## 2021-04-04 ENCOUNTER — Other Ambulatory Visit: Payer: Self-pay

## 2021-04-04 ENCOUNTER — Encounter (HOSPITAL_COMMUNITY): Payer: Medicare Other

## 2021-04-04 VITALS — Wt 142.2 lb

## 2021-04-04 DIAGNOSIS — I214 Non-ST elevation (NSTEMI) myocardial infarction: Secondary | ICD-10-CM | POA: Diagnosis not present

## 2021-04-04 DIAGNOSIS — I5181 Takotsubo syndrome: Secondary | ICD-10-CM | POA: Diagnosis not present

## 2021-04-06 ENCOUNTER — Encounter (HOSPITAL_COMMUNITY): Payer: Medicare Other

## 2021-04-06 ENCOUNTER — Other Ambulatory Visit: Payer: Self-pay

## 2021-04-06 ENCOUNTER — Encounter (HOSPITAL_COMMUNITY)
Admission: RE | Admit: 2021-04-06 | Discharge: 2021-04-06 | Disposition: A | Discharge status: Home / Self Care | Payer: Medicare Other | Source: Ambulatory Visit | Attending: Cardiology | Admitting: Cardiology

## 2021-04-06 VITALS — BP 108/82 | HR 70 | Ht 65.75 in | Wt 140.0 lb

## 2021-04-06 DIAGNOSIS — I214 Non-ST elevation (NSTEMI) myocardial infarction: Secondary | ICD-10-CM | POA: Diagnosis not present

## 2021-04-06 DIAGNOSIS — I5181 Takotsubo syndrome: Secondary | ICD-10-CM

## 2021-04-07 ENCOUNTER — Ambulatory Visit
Admission: RE | Admit: 2021-04-07 | Discharge: 2021-04-07 | Disposition: A | Discharge status: Other Acute Inpatient Hospital | Payer: Medicare Other | Source: Ambulatory Visit | Attending: Internal Medicine | Admitting: Internal Medicine

## 2021-04-07 ENCOUNTER — Ambulatory Visit (INDEPENDENT_AMBULATORY_CARE_PROVIDER_SITE_OTHER): Payer: Medicare Other

## 2021-04-07 ENCOUNTER — Other Ambulatory Visit: Payer: Self-pay

## 2021-04-07 ENCOUNTER — Emergency Department (HOSPITAL_BASED_OUTPATIENT_CLINIC_OR_DEPARTMENT_OTHER)
Admission: EM | Admit: 2021-04-07 | Discharge: 2021-04-07 | Disposition: A | Discharge status: Home / Self Care | Payer: Medicare Other | Attending: Emergency Medicine | Admitting: Emergency Medicine

## 2021-04-07 ENCOUNTER — Encounter (HOSPITAL_BASED_OUTPATIENT_CLINIC_OR_DEPARTMENT_OTHER): Payer: Self-pay | Admitting: *Deleted

## 2021-04-07 ENCOUNTER — Emergency Department (HOSPITAL_BASED_OUTPATIENT_CLINIC_OR_DEPARTMENT_OTHER): Payer: Medicare Other

## 2021-04-07 VITALS — BP 115/60 | HR 68 | Temp 97.7°F | Resp 18 | Ht 66.5 in | Wt 142.2 lb

## 2021-04-07 DIAGNOSIS — I5032 Chronic diastolic (congestive) heart failure: Secondary | ICD-10-CM | POA: Diagnosis not present

## 2021-04-07 DIAGNOSIS — Z7901 Long term (current) use of anticoagulants: Secondary | ICD-10-CM | POA: Insufficient documentation

## 2021-04-07 DIAGNOSIS — Z85118 Personal history of other malignant neoplasm of bronchus and lung: Secondary | ICD-10-CM | POA: Diagnosis not present

## 2021-04-07 DIAGNOSIS — F17210 Nicotine dependence, cigarettes, uncomplicated: Secondary | ICD-10-CM | POA: Insufficient documentation

## 2021-04-07 DIAGNOSIS — M79671 Pain in right foot: Secondary | ICD-10-CM | POA: Insufficient documentation

## 2021-04-07 DIAGNOSIS — L089 Local infection of the skin and subcutaneous tissue, unspecified: Secondary | ICD-10-CM | POA: Diagnosis not present

## 2021-04-07 DIAGNOSIS — E039 Hypothyroidism, unspecified: Secondary | ICD-10-CM | POA: Diagnosis not present

## 2021-04-07 DIAGNOSIS — M79674 Pain in right toe(s): Secondary | ICD-10-CM

## 2021-04-07 DIAGNOSIS — L0390 Cellulitis, unspecified: Secondary | ICD-10-CM

## 2021-04-07 DIAGNOSIS — J45909 Unspecified asthma, uncomplicated: Secondary | ICD-10-CM | POA: Insufficient documentation

## 2021-04-07 DIAGNOSIS — J449 Chronic obstructive pulmonary disease, unspecified: Secondary | ICD-10-CM | POA: Insufficient documentation

## 2021-04-07 DIAGNOSIS — Z79899 Other long term (current) drug therapy: Secondary | ICD-10-CM | POA: Insufficient documentation

## 2021-04-07 DIAGNOSIS — M7989 Other specified soft tissue disorders: Secondary | ICD-10-CM | POA: Diagnosis not present

## 2021-04-07 DIAGNOSIS — L03031 Cellulitis of right toe: Secondary | ICD-10-CM | POA: Diagnosis not present

## 2021-04-07 LAB — CBC WITH DIFFERENTIAL/PLATELET
Abs Immature Granulocytes: 0.02 10*3/uL (ref 0.00–0.07)
Basophils Absolute: 0 10*3/uL (ref 0.0–0.1)
Basophils Relative: 0 %
Eosinophils Absolute: 0.1 10*3/uL (ref 0.0–0.5)
Eosinophils Relative: 1 %
HCT: 40.1 % (ref 36.0–46.0)
Hemoglobin: 12.8 g/dL (ref 12.0–15.0)
Immature Granulocytes: 0 %
Lymphocytes Relative: 19 %
Lymphs Abs: 1.3 10*3/uL (ref 0.7–4.0)
MCH: 27.9 pg (ref 26.0–34.0)
MCHC: 31.9 g/dL (ref 30.0–36.0)
MCV: 87.6 fL (ref 80.0–100.0)
Monocytes Absolute: 0.4 10*3/uL (ref 0.1–1.0)
Monocytes Relative: 6 %
Neutro Abs: 4.8 10*3/uL (ref 1.7–7.7)
Neutrophils Relative %: 74 %
Platelets: 187 10*3/uL (ref 150–400)
RBC: 4.58 MIL/uL (ref 3.87–5.11)
RDW: 12.6 % (ref 11.5–15.5)
WBC: 6.5 10*3/uL (ref 4.0–10.5)
nRBC: 0 % (ref 0.0–0.2)

## 2021-04-07 LAB — SEDIMENTATION RATE: Sed Rate: 23 mm/hr — ABNORMAL HIGH (ref 0–22)

## 2021-04-07 LAB — BASIC METABOLIC PANEL
Anion gap: 9 (ref 5–15)
BUN: 9 mg/dL (ref 8–23)
CO2: 30 mmol/L (ref 22–32)
Calcium: 9.9 mg/dL (ref 8.9–10.3)
Chloride: 94 mmol/L — ABNORMAL LOW (ref 98–111)
Creatinine, Ser: 0.74 mg/dL (ref 0.44–1.00)
GFR, Estimated: >60 mL/min (ref >60)
Glucose, Bld: 81 mg/dL (ref 70–99)
Potassium: 3.4 mmol/L — ABNORMAL LOW (ref 3.5–5.1)
Sodium: 133 mmol/L — ABNORMAL LOW (ref 135–145)

## 2021-04-07 LAB — C-REACTIVE PROTEIN: CRP: 0.6 mg/dL (ref <1.0)

## 2021-04-07 MED ORDER — CEFADROXIL 500 MG PO CAPS: 500.0000 mg | ORAL_CAPSULE | Freq: Two times a day (BID) | ORAL | 0 refills | Status: AC

## 2021-04-07 MED ORDER — ACETAMINOPHEN 325 MG PO TABS
650.0000 mg | ORAL_TABLET | Freq: Once | ORAL | Status: AC
  Administered 2021-04-07: 650 mg via ORAL
  Filled 2021-04-07: qty 2

## 2021-04-07 MED ORDER — SODIUM CHLORIDE 0.9 % IV SOLN
1.0000 g | INTRAVENOUS | Status: DC
  Administered 2021-04-07: 1 g via INTRAVENOUS
  Filled 2021-04-07: qty 10

## 2021-04-07 NOTE — Discharge Instructions (Signed)
Please go to the emergency department as soon as you leave urgent care for further evaluation and management. ?

## 2021-04-07 NOTE — ED Triage Notes (Signed)
Patient was seen at [facility] today and was advised to come here.  Right great toe is slightly swollen and painful for 2 weeks.

## 2021-04-07 NOTE — ED Triage Notes (Signed)
Patient c/o right big toe swelling and pain x 2 weeks.  No apparent injury.  Patient has been doing hydrogen peroxide, neosporin.  Patient has taken Tylenol for pain.

## 2021-04-07 NOTE — ED Provider Notes (Signed)
  Physical Exam  BP 124/76   Pulse 90   Temp 98.4 F (36.9 C)   Resp 20   Ht 5' 6.5" (1.689 m)   Wt 64.5 kg   SpO2 100%   BMI 22.61 kg/m   Physical Exam Vitals and nursing note reviewed.  Constitutional:      General: She is not in acute distress.    Appearance: She is well-developed.  HENT:     Head: Normocephalic and atraumatic.  Eyes:     Conjunctiva/sclera: Conjunctivae normal.  Cardiovascular:     Rate and Rhythm: Normal rate and regular rhythm.     Heart sounds: No murmur heard. Pulmonary:     Effort: Pulmonary effort is normal. No respiratory distress.     Breath sounds: Normal breath sounds.  Abdominal:     Palpations: Abdomen is soft.     Tenderness: There is no abdominal tenderness.  Musculoskeletal:        General: No swelling.     Cervical back: Neck supple.  Skin:    General: Skin is warm and dry.     Capillary Refill: Capillary refill takes less than 2 seconds.     Findings: Erythema (great toe) present.  Neurological:     Mental Status: She is alert.  Psychiatric:        Mood and Affect: Mood normal.    ED Course/Procedures   Clinical Course as of 04/07/21 2309  Sat Apr 07, 2021  1514 Concern for osteo, pending CT and ESR CRP [MK]  1515 Message dr. Jacqualyn Posey podaitry if CT not absurd [MK]    Clinical Course User Index [MK] Carizma Dunsworth, Debe Coder, MD    Procedures  MDM  Patient received in handoff.  Great toe infection with concern for possible osteo.  MRI not available at this facility but CT is unremarkable.  ESR CRP mildly elevated.  Spoke with podiatry who is okay with outpatient follow-up on antibiotics.  Patient given single dose ceftriaxone and discharged with a prescription for Duricef.  Podiatry will see the patient in the outpatient setting.       Teressa Lower, MD 04/07/21 [phone]

## 2021-04-07 NOTE — ED Provider Notes (Signed)
[facility]    CSN: [id number] Arrival date & time: 04/07/21  1044      History   Chief Complaint Chief Complaint  Patient presents with   Toe Pain    HPI Joy Williams is a 70 y.o. female.   Patient presents with right great toe swelling and pain that has been present for approximately 2 weeks.  Denies any apparent injury.  Denies any history of arthritis or gout.  Patient has been using hydron peroxide and Neosporin with no improvement in symptoms.  Has also been taking Tylenol for pain.  Denies any purulent drainage until a few days ago.  Patient did recently have her nails done and nail technician stated that "her nails look normal".  Denies any numbness or tingling.  She does have Raynaud's and attributes discoloration to all toes to this and states this is normal.   Toe Pain   Past Medical History:  Diagnosis Date   Allergy    Asthma    pt denies this, but is on Dulera   Carotid artery disease (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, each treated by wedge resection with post-op adjuvant chemoRx at [facility]   MVP (mitral valve prolapse) 2018   s/p Minimally-Invasive Mitral Valve Replacement w/  [facility] Mitral bovine bioprosthetic tissue valve (size 67mm, model # 7300TFX, serial # [id number])   PONV (postoperative nausea and vomiting)    Raynaud's syndrome    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   31 mm [facility]  mitral bovine bioprosthetic tissue valve placed via right mini thoracotomy approach   Severe mitral regurgitation 11/15/2014   Shortness of breath dyspnea    with exertion   STD (sexually transmitted disease)    STEMI (ST elevation myocardial infarction) ([place]) 10/24/2020   Telangiectasia     Patient Active Problem List   Diagnosis Date Noted   Bee sting allergy 12/13/2020   Pruritus 12/45/8099   Acute systolic CHF (congestive heart failure) ([place]) 10/25/2020   STEMI (ST elevation myocardial infarction) ([place]) 10/24/2020   Precordial chest pain 10/24/2020   Takotsubo cardiomyopathy    Closed fracture of phalanx of foot 08/17/2020   Symptomatic stenosis of right carotid artery 08/08/2020   TIA (transient ischemic attack) 08/06/2020   Anemia 08/06/2020   Encounter for counseling 02/08/2020   Suppression of immune system - subtherapeutic ([place]) 07/14/2019   History of TIA (transient ischemic attack) 05/10/2019   Non-ST elevated myocardial infarction ([place]) 02/25/2019   NSTEMI (non-ST elevated myocardial infarction) ([place]) 02/25/2019   Elevated troponin level not due to acute coronary syndrome    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   On statin therapy 04/27/2018   Memory loss or impairment 83/38/2505   Complicated migraine 39/76/7341   Hyperlipidemia LDL goal <70 03/12/2018   History of RPR test, 9/18 felt to be false positive 03/12/2018   Stroke-like episode ([place])  s/p tPA 03/11/2018   Dysuria 10/14/2017   Abnormal finding on urinalysis 10/14/2017   Pharyngitis 10/14/2017   Closed nondisplaced fracture of base of fifth metacarpal bone of right hand 05/27/2017   Decreased range of motion of right ankle 05/27/2017   Pain and swelling of right wrist 05/27/2017   Age-related osteoporosis without current pathological fracture 02/20/2017   ASCUS of cervix with negative high risk HPV 01/29/2017   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/24/2017   Tobacco abuse 01/22/2017   Tobacco abuse counseling 01/22/2017   Neoplastic malignant related fatigue 01/22/2017   Vitamin D insufficiency 01/22/2017   Estrogen deficiency 01/14/2017   H/O cardiac catheterization- in prep for lung ca sx 01/06/2017   Hypothyroidism 01/06/2017   Previous back surgeries 01/06/2017   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   Chronic diastolic congestive heart failure (HCC)    Chronic obstructive pulmonary disease (HCC)    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   Antineoplastic chemotherapy induced anemia 05/05/2015   History of epistaxis 03/30/2015   Raynaud's disease 01/19/2015   Malignant neoplasm of left lung ([place]) 12/30/2014   Lung cancer ([place]) 12/30/2014   Malignant neoplasm of overlapping sites of left lung ([place]) 12/06/2014   Nonintractable episodic headache 12/06/2014   Severe mitral regurgitation 11/15/2014   Scleroderma ([place]) 10/03/2014   Asthmatic bronchitis , chronic ([place]) 10/03/2014   Multiple pulmonary nodules 10/03/2014   Current Cigarette smoker 10/03/2014    Past Surgical History:  Procedure Laterality Date   BACK SURGERY     x 3  Disectomy   BREAST BIOPSY Left    CARDIAC CATHETERIZATION N/A 03/08/2016   Procedure: Right/Left Heart Cath and Coronary Angiography;  Surgeon: Sherren Mocha, MD;  Location: [facility];  Service: Cardiovascular;  Laterality: N/A;   CLAVICLE SURGERY Left 2013   plate to left collar bone   ENDARTERECTOMY Right 08/09/2020   Procedure: RIGHT CAROTID ENDARTERECTOMY;  Surgeon: Waynetta Sandy, MD;  Location: [facility];  Service: Vascular;  Laterality: Right;   LAPAROSCOPY     ? reason-age 8    LEFT HEART CATH AND CORONARY ANGIOGRAPHY N/A 02/25/2019   Procedure: LEFT HEART CATH AND CORONARY ANGIOGRAPHY;  Surgeon: Jettie Booze, MD;  Location: [facility];  Service: Cardiovascular;  Laterality: N/A;   LEFT HEART  CATH AND CORONARY ANGIOGRAPHY N/A 10/24/2020   Procedure: LEFT HEART CATH AND CORONARY ANGIOGRAPHY;  Surgeon: Jettie Booze, MD;  Location: [facility];  Service: Cardiovascular;  Laterality: N/A;   LUNG CANCER SURGERY     MITRAL VALVE REPAIR Right 06/27/2016   Procedure: MINIMALLY INVASIVE MITRAL VALVE REPLACEMENT;  Surgeon: Rexene Alberts, MD;  Location: [facility];  Service: Open Heart Surgery;  Laterality: Right;   TEE WITHOUT CARDIOVERSION N/A 02/22/2016   Procedure: TRANSESOPHAGEAL ECHOCARDIOGRAM (TEE);  Surgeon: Dorothy Spark, MD;  Location: [facility];  Service: Cardiovascular;  Laterality: N/A;   TEE WITHOUT CARDIOVERSION N/A 06/27/2016   Procedure: TRANSESOPHAGEAL ECHOCARDIOGRAM (TEE);  Surgeon: Rexene Alberts, MD;  Location: [facility];  Service: Open Heart Surgery;  Laterality: N/A;   TONSILLECTOMY     VIDEO ASSISTED THORACOSCOPY (VATS)/WEDGE RESECTION Left 12/30/2014   Bronchoscopy, Mediastinoscopy, Left VATS for Wedge resection LUL x2 adn LLL x1 - Dr. Elenor Quinones at [facility] Left 2017   injected with botox   wrist surgery Left 2015   plate  to wrist     OB History     Gravida  3   Para  2   Term  2   Preterm      AB  1   Living  2      SAB      IAB      Ectopic      Multiple      Live Births  2            Home Medications    Prior to Admission medications   Medication Sig Start Date End Date Taking? Authorizing Provider  acetaminophen (TYLENOL) 500 MG tablet Take 1,000 mg by mouth every 6 (six) hours as needed for mild pain or headache.   Yes [provider]  Cholecalciferol (VITAMIN D) 125 MCG (5000 UT) CAPS Take 5,000 Units by mouth daily.   Yes [provider]  clopidogrel (PLAVIX) 75 MG tablet Take 1 tablet (75 mg total) by mouth daily. 10/29/20  Yes Kroeger, Daleen Snook M., PA-C  EPINEPHrine (EPIPEN 2-PAK) 0.3 mg/0.3 mL IJ SOAJ injection Inject 0.3 mg into the muscle as needed for anaphylaxis. 12/13/20  Yes  Ronnell Freshwater, NP  ferrous sulfate 325 (65 FE) MG tablet Take 1 tablet (325 mg total) by mouth daily. 10/29/20 10/29/21 Yes Kroeger, Daleen Snook M., PA-C  hydrOXYzine (ATARAX/VISTARIL) 25 MG tablet TAKE 1/2 TO 1 TABLET BY MOUTH TWICE DAILY AS NEEDED FOR ITCHING 01/29/21  Yes Abonza, Maritza, PA-C  levothyroxine (SYNTHROID) 175 MCG tablet TAKE 1 TABLET BY MOUTH EVERY DAY BEFORE BREAKFAST 01/11/21  Yes Abonza, Maritza, PA-C  MELATONIN PO Take 1 tablet by mouth at bedtime as needed (sleep).   Yes [provider]  Multiple Vitamins-Minerals (MULTIVITAMIN WITH MINERALS) tablet Take 1 tablet by mouth daily.   Yes [provider]  nicotine (NICODERM CQ - DOSED IN MG/24 HR) 7 mg/24hr patch Place 1 patch (7 mg total) onto the skin daily. 10/29/20  Yes Kroeger, Daleen Snook M., PA-C  rosuvastatin (CRESTOR) 20 MG tablet Take 1 tablet (20 mg total) by mouth daily. Patient taking differently: Take 20 mg by mouth every evening. 12/01/20  Yes Abonza, Maritza, PA-C  TRELEGY ELLIPTA 100-62.5-25 MCG/INH AEPB Inhale 1 puff into the lungs daily. 02/26/21  Yes [provider]  Turmeric 500 MG CAPS Take 500 mg by mouth daily.   Yes [provider]  vitamin B-12 (CYANOCOBALAMIN) 1000 MCG tablet Take 1 tablet (1,000 mcg total) by mouth daily. 08/10/20  Yes Geradine Girt, DO    Family History Family History  Problem Relation Age of Onset   Mitral valve prolapse Mother    Dementia Father    Prostate cancer Father    Mitral valve prolapse Sister    Mitral valve prolapse Brother    Healthy Son    Healthy Daughter    Colon cancer Neg Hx    Colon polyps Neg Hx    Rectal cancer Neg Hx    Esophageal cancer Neg Hx    Stomach cancer Neg Hx    Heart attack Neg Hx     Social History Social History   Tobacco Use   Smoking status: Every Day    Packs/day: 0.75    Years: 24.00    Pack years: 18.00    Types: E-cigarettes, Cigarettes    Start date: 05/21/1983   Smokeless tobacco: Never    Tobacco comments:    Currently smoking 3cigs per day as of 02/15/21  Vaping Use   Vaping  Use: Former   Quit date: 05/20/2006  Substance Use Topics   Alcohol use: Yes    Alcohol/week: 2.0 standard drinks    Types: 1 Shots of liquor, 1 Standard drinks or equivalent per week    Comment: occ   Drug use: No     Allergies   Penicillins and Bee venom   Review of Systems Review of Systems Per HPI  Physical Exam Triage Vital Signs ED Triage Vitals  Enc Vitals Group     BP 04/07/21 1113 115/60     Pulse Rate 04/07/21 1113 68     Resp 04/07/21 1113 18     Temp 04/07/21 1113 97.7 F (36.5 C)     Temp Source 04/07/21 1113 Oral     SpO2 04/07/21 1113 96 %     Weight 04/07/21 1114 142 lb 3.2 oz (64.5 kg)     Height 04/07/21 1114 5' 6.5" (1.689 m)     Head Circumference --      Peak Flow --      Pain Score 04/07/21 1114 8     Pain Loc --      Pain Edu? --      Excl. in [place]? --    No data found.  Updated Vital Signs BP 115/60 (BP Location: Left Arm)   Pulse 68   Temp 97.7 F (36.5 C) (Oral)   Resp 18   Ht 5' 6.5" (1.689 m)   Wt 142 lb 3.2 oz (64.5 kg)   SpO2 96%   BMI 22.61 kg/m   Visual Acuity Right Eye Distance:   Left Eye Distance:   Bilateral Distance:    Right Eye Near:   Left Eye Near:    Bilateral Near:     Physical Exam Constitutional:      General: She is not in acute distress.    Appearance: Normal appearance. She is not toxic-appearing or diaphoretic.  HENT:     Head: Normocephalic and atraumatic.  Eyes:     Extraocular Movements: Extraocular movements intact.     Conjunctiva/sclera: Conjunctivae normal.  Pulmonary:     Effort: Pulmonary effort is normal.  Musculoskeletal:     Right foot: Normal range of motion and normal capillary refill. Swelling, tenderness and bony tenderness present. No deformity or crepitus. Normal pulse.     Comments: Patient does have bluish discoloration throughout toes of bilateral lower extremities.  Patient states this  is normal for her.  Has increased redness and swelling to right great toe on dorsal surface.  Also has some purulent drainage and surrounding skin of nail.  Otherwise neurovascular intact.  Neurological:     General: No focal deficit present.     Mental Status: She is alert and oriented to person, place, and time. Mental status is at baseline.  Psychiatric:        Mood and Affect: Mood normal.        Behavior: Behavior normal.        Thought Content: Thought content normal.        Judgment: Judgment normal.     UC Treatments / Results  Labs (all labs ordered are listed, but only abnormal results are displayed) Labs Reviewed - No data to display  EKG   Radiology DG Toe Great Right  Result Date: 04/07/2021 CLINICAL DATA:  Patient complaining of right big toe swelling and pain for 2 weeks. No apparent injury. EXAM: RIGHT GREAT TOE COMPARISON:  None. FINDINGS: There is no evidence of fracture  or dislocation. There is mild lucency at the anterior base of the proximal phalanx best seen on lateral view which is nonspecific though early erosive lesion not excluded. Regional soft tissues are unremarkable. IMPRESSION: 1. No acute osseous abnormality in the right great toe. 2. Mild lucency at the anterior base of the proximal phalanx is nonspecific though could represent early erosive lesion. Electronically Signed   By: Audie Pinto M.D.   On: 04/07/2021 11:56    Procedures Procedures (including critical care time)  Medications Ordered in UC Medications - No data to display  Initial Impression / Assessment and Plan / UC Course  I have reviewed the triage vital signs and the nursing notes.  Pertinent labs & imaging results that were available during my care of the patient were reviewed by me and considered in my medical decision making (see chart for details).     Right great toe x-ray showing early erosive lesion.  Could be related to arthritis but in the presence of skin infection  of same toe, patient will need further imaging and evaluation at the hospital to rule out osteomyelitis.  Advised patient to go to the hospital for further evaluation and management.  Patient was agreeable with plan.  Vital signs stable at discharge.  Agree with patient self transport to the hospital. Final Clinical Impressions(s) / UC Diagnoses   Final diagnoses:  Infection of great toe     Discharge Instructions      Please go to the emergency department as soon as you leave urgent care for further evaluation and management.     ED Prescriptions   None    PDMP not reviewed this encounter.   Teodora Medici, [place] 04/07/21 1228

## 2021-04-07 NOTE — ED Provider Notes (Signed)
[facility] EMERGENCY DEPT Provider Note   CSN: [id number] Arrival date & time: 04/07/21  1259     History Chief Complaint  Patient presents with   Toe Pain    Joy Williams is a 70 y.o. female.  HPI 70 year old female presents with right great toe swelling and pain for about 2 weeks.  She denies a specific injury but states that dogs and children step on her toes all the time.  She has chronic problems with her great toe nail.  She denies fever/systemic symptoms.  She does have moderate pain to the toe.  She has Raynaud's and so she always has a little bit of discoloration to her toes but her great toe is definitely swollen and discolored compared to the others.  She denies any history of diabetes.  She went to urgent care and was going to be treated for cellulitis but an x-ray showed a possible erosion of her toe and so she was sent here for further evaluation.  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 [facility] (had to have botox injection)   COPD (chronic obstructive pulmonary disease) ([place])    Family history of adverse reaction to anesthesia    Mother- very sensitive to medication   Heart murmur    MVP   Hypothyroidism    Lung cancer ([place]) 12/30/2014   Synchronous primaries:  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, each treated by wedge resection with post-op adjuvant chemoRx at [facility]   MVP (mitral valve prolapse) 2018   s/p Minimally-Invasive Mitral Valve Replacement w/  [facility] Mitral bovine bioprosthetic tissue valve (size 85mm, model # 7300TFX, serial # [id number])   PONV (postoperative nausea and vomiting)    Raynaud's syndrome    S/P minimally invasive mitral  valve replacement with bioprosthetic valve 06/27/2016   31 mm [facility] mitral bovine bioprosthetic tissue valve placed via right mini thoracotomy approach   Severe mitral regurgitation 11/15/2014   Shortness of breath dyspnea    with exertion   STD (sexually transmitted disease)    STEMI (ST elevation myocardial infarction) ([place]) 10/24/2020   Telangiectasia     Patient Active Problem List   Diagnosis Date Noted   Bee sting allergy 12/13/2020   Pruritus 03/70/4888   Acute systolic CHF (congestive heart failure) ([place]) 10/25/2020   STEMI (ST elevation myocardial infarction) ([place]) 10/24/2020   Precordial chest pain 10/24/2020   Takotsubo cardiomyopathy    Closed fracture of phalanx of foot 08/17/2020   Symptomatic stenosis of right carotid artery 08/08/2020   TIA (transient ischemic attack) 08/06/2020   Anemia 08/06/2020   Encounter for counseling 02/08/2020   Suppression of immune system - subtherapeutic ([place]) 07/14/2019   History of TIA (transient ischemic attack) 05/10/2019   Non-ST elevated myocardial infarction ([place]) 02/25/2019   NSTEMI (non-ST elevated myocardial infarction) ([place]) 02/25/2019   Elevated troponin level not due to acute coronary syndrome    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   On statin therapy 04/27/2018   Memory loss or impairment 91/69/4503   Complicated migraine 88/82/8003   Hyperlipidemia LDL goal <70 03/12/2018   History of  RPR test, 9/18 felt to be false positive 03/12/2018   Stroke-like episode ([place]) s/p tPA 03/11/2018   Dysuria 10/14/2017   Abnormal finding on urinalysis 10/14/2017   Pharyngitis 10/14/2017   Closed nondisplaced fracture of base of fifth metacarpal bone of right hand 05/27/2017   Decreased range of motion of right ankle 05/27/2017   Pain and swelling of right wrist 05/27/2017   Age-related osteoporosis without current pathological fracture 02/20/2017   ASCUS of cervix with negative high risk HPV 01/29/2017   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   Tobacco abuse 01/22/2017   Tobacco abuse counseling 01/22/2017   Neoplastic malignant related fatigue 01/22/2017   Vitamin D insufficiency 01/22/2017   Estrogen deficiency 01/14/2017   H/O cardiac catheterization- in prep for lung ca sx 01/06/2017   Hypothyroidism 01/06/2017   Previous back surgeries 01/06/2017   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   Chronic diastolic congestive heart failure (HCC)    Chronic obstructive pulmonary disease (HCC)    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   Antineoplastic chemotherapy induced anemia 05/05/2015   History of epistaxis 03/30/2015   Raynaud's disease 01/19/2015   Malignant neoplasm of left lung ([place]) 12/30/2014   Lung cancer ([place]) 12/30/2014   Malignant neoplasm of overlapping sites of left lung ([place]) 12/06/2014   Nonintractable episodic headache 12/06/2014   Severe mitral regurgitation 11/15/2014   Scleroderma ([place]) 10/03/2014   Asthmatic bronchitis , chronic ([place]) 10/03/2014   Multiple pulmonary nodules 10/03/2014   Current Cigarette smoker 10/03/2014    Past Surgical History:  Procedure Laterality Date   BACK SURGERY     x 3  Disectomy   BREAST BIOPSY Left    CARDIAC CATHETERIZATION N/A 03/08/2016   Procedure: Right/Left Heart Cath and Coronary Angiography;  Surgeon: Sherren Mocha, MD;  Location: [place] CV LAB;  Service: Cardiovascular;  Laterality: N/A;   CLAVICLE SURGERY Left 2013   plate to left collar bone   ENDARTERECTOMY Right 08/09/2020   Procedure: RIGHT CAROTID ENDARTERECTOMY;  Surgeon: Waynetta Sandy, MD;  Location: [facility];  Service: Vascular;  Laterality: Right;   LAPAROSCOPY     ? reason-age 89    LEFT HEART CATH AND CORONARY ANGIOGRAPHY N/A 02/25/2019   Procedure: LEFT HEART CATH AND CORONARY ANGIOGRAPHY;  Surgeon: Jettie Booze, MD;  Location:  [place] [facility];  Service: Cardiovascular;  Laterality: N/A;   LEFT HEART CATH AND CORONARY ANGIOGRAPHY N/A 10/24/2020   Procedure: LEFT HEART CATH AND CORONARY ANGIOGRAPHY;  Surgeon: Jettie Booze, MD;  Location: [place] [facility] CV LAB;  Service: Cardiovascular;  Laterality: N/A;   LUNG CANCER SURGERY     MITRAL VALVE REPAIR Right 06/27/2016   Procedure: MINIMALLY INVASIVE MITRAL VALVE REPLACEMENT;  Surgeon: Rexene Alberts, MD;  Location: [facility];  Service: Open Heart Surgery;  Laterality: Right;   TEE WITHOUT CARDIOVERSION N/A 02/22/2016   Procedure: TRANSESOPHAGEAL ECHOCARDIOGRAM (TEE);  Surgeon: Dorothy Spark, MD;  Location: [facility];  Service: Cardiovascular;  Laterality: N/A;   TEE WITHOUT CARDIOVERSION N/A 06/27/2016   Procedure: TRANSESOPHAGEAL ECHOCARDIOGRAM (TEE);  Surgeon: Rexene Alberts, MD;  Location: [facility];  Service: Open Heart Surgery;  Laterality: N/A;   TONSILLECTOMY     VIDEO ASSISTED THORACOSCOPY (VATS)/WEDGE RESECTION Left 12/30/2014   Bronchoscopy, Mediastinoscopy, Left VATS for Wedge resection LUL x2 adn LLL x1 - Dr. Elenor Quinones at [place] Left 2017  injected with botox   wrist surgery Left 2015   plate to wrist      OB History     Gravida  3   Para  2   Term  2   Preterm      AB  1   Living  2      SAB      IAB      Ectopic      Multiple      Live Births  2           Family History  Problem Relation Age of Onset   Mitral valve prolapse Mother    Dementia Father    Prostate cancer Father    Mitral valve prolapse Sister    Mitral valve prolapse Brother    Healthy Son    Healthy Daughter    Colon cancer Neg Hx    Colon polyps Neg Hx    Rectal cancer Neg Hx    Esophageal cancer Neg Hx    Stomach cancer Neg Hx    Heart attack Neg Hx     Social History   Tobacco Use   Smoking status: Some Days    Packs/day: 0.75    Years: 24.00    Pack years: 18.00    Types: Cigarettes    Start date: 05/21/1983    Smokeless tobacco: Never   Tobacco comments:    Currently smoking 3cigs per day as of 02/15/21  Vaping Use   Vaping Use: Never used  Substance Use Topics   Alcohol use: Yes    Alcohol/week: 2.0 standard drinks    Types: 1 Shots of liquor, 1 Standard drinks or equivalent per week    Comment: occ   Drug use: No    Home Medications Prior to Admission medications   Medication Sig Start Date End Date Taking? Authorizing Provider  Cholecalciferol (VITAMIN D) 125 MCG (5000 UT) CAPS Take 5,000 Units by mouth daily.   Yes [provider]  clopidogrel (PLAVIX) 75 MG tablet Take 1 tablet (75 mg total) by mouth daily. 10/29/20  Yes Kroeger, Lorelee Cover., PA-C  ferrous sulfate 325 (65 FE) MG tablet Take 1 tablet (325 mg total) by mouth daily. 10/29/20 10/29/21 Yes Kroeger, Lorelee Cover., PA-C  levothyroxine (SYNTHROID) 175 MCG tablet TAKE 1 TABLET BY MOUTH EVERY DAY BEFORE BREAKFAST 01/11/21  Yes Abonza, Maritza, PA-C  MELATONIN PO Take 1 tablet by mouth at bedtime as needed (sleep).   Yes [provider]  Multiple Vitamins-Minerals (MULTIVITAMIN WITH MINERALS) tablet Take 1 tablet by mouth daily.   Yes [provider]  rosuvastatin (CRESTOR) 20 MG tablet Take 1 tablet (20 mg total) by mouth daily. Patient taking differently: Take 20 mg by mouth every evening. 12/01/20  Yes Abonza, Maritza, PA-C  Turmeric 500 MG CAPS Take 500 mg by mouth daily.   Yes [provider]  vitamin B-12 (CYANOCOBALAMIN) 1000 MCG tablet Take 1 tablet (1,000 mcg total) by mouth daily. 08/10/20  Yes Geradine Girt, DO  acetaminophen (TYLENOL) 500 MG tablet Take 1,000 mg by mouth every 6 (six) hours as needed for mild pain or headache.    [provider]  EPINEPHrine (EPIPEN 2-PAK) 0.3 mg/0.3 mL IJ SOAJ injection Inject 0.3 mg into the muscle as needed for anaphylaxis. 12/13/20   Ronnell Freshwater, NP  nicotine (NICODERM CQ - DOSED IN MG/24 HR) 7 mg/24hr patch Place 1 patch (7 mg total) onto the  skin daily. 10/29/20  Kroeger, Daleen Snook M., PA-C  TRELEGY ELLIPTA 100-62.5-25 MCG/INH AEPB Inhale 1 puff into the lungs daily. 02/26/21   [provider]    Allergies    Penicillins and Bee venom  Review of Systems   Review of Systems  Constitutional:  Negative for fever.  Musculoskeletal:  Positive for arthralgias and joint swelling.  Skin:  Positive for color change. Negative for wound.  All other systems reviewed and are negative.  Physical Exam Updated Vital Signs BP 122/62   Pulse 70   Temp 97.7 F (36.5 C)   Resp 18   Ht 5' 6.5" (1.689 m)   Wt 64.5 kg   SpO2 98%   BMI 22.61 kg/m   Physical Exam Vitals and nursing note reviewed.  Constitutional:      Appearance: She is well-developed.  HENT:     Head: Normocephalic and atraumatic.     Right Ear: External ear normal.     Left Ear: External ear normal.     Nose: Nose normal.  Eyes:     General:        Right eye: No discharge.        Left eye: No discharge.  Cardiovascular:     Rate and Rhythm: Normal rate and regular rhythm.     Pulses:          Dorsalis pedis pulses are 2+ on the right side.  Pulmonary:     Effort: Pulmonary effort is normal.  Abdominal:     General: There is no distension.  Musculoskeletal:     Right foot: Swelling and tenderness present.       Feet:  Feet:     Right foot:     Skin integrity: Erythema present.     Toenail Condition: Fungal disease present.    Comments: See picture. Diffusely swollen great toe with point tenderness to dorsal distal toe (but not where the questionable xray finding is) Skin:    General: Skin is warm and dry.  Neurological:     Mental Status: She is alert.  Psychiatric:        Mood and Affect: Mood is not anxious.     ED Results / Procedures / Treatments   Labs (all labs ordered are listed, but only abnormal results are displayed) Labs Reviewed  BASIC METABOLIC PANEL - Abnormal; Notable for the following components:      Result Value    Sodium 133 (*)    Potassium 3.4 (*)    Chloride 94 (*)    All other components within normal limits  CBC WITH DIFFERENTIAL/PLATELET  SEDIMENTATION RATE  C-REACTIVE PROTEIN    EKG None  Radiology DG Toe Great Right  Result Date: 04/07/2021 CLINICAL DATA:  Patient complaining of right big toe swelling and pain for 2 weeks. No apparent injury. EXAM: RIGHT GREAT TOE COMPARISON:  None. FINDINGS: There is no evidence of fracture or dislocation. There is mild lucency at the anterior base of the proximal phalanx best seen on lateral view which is nonspecific though early erosive lesion not excluded. Regional soft tissues are unremarkable. IMPRESSION: 1. No acute osseous abnormality in the right great toe. 2. Mild lucency at the anterior base of the proximal phalanx is nonspecific though could represent early erosive lesion. Electronically Signed   By: Audie Pinto M.D.   On: 04/07/2021 11:56    Procedures Procedures   Medications Ordered in ED Medications  acetaminophen (TYLENOL) tablet 650 mg (650 mg Oral Given 04/07/21 1349)  ED Course  I have reviewed the triage vital signs and the nursing notes.  Pertinent labs & imaging results that were available during my care of the patient were reviewed by me and considered in my medical decision making (see chart for details).   MDM Rules/Calculators/A&P                           Patient is well appearing. Unfortunately we don't have MRI here and she was referred here.  Given her well appearance, I discussed with Dr. Earleen Newport of podiatry.  He has reviewed the images are provided as well as the x-ray.  At this point given no emergent MRI available, he recommends CT with/without contrast (which radiologist changed to just without) and labs.  If she is well-appearing and CT is okay, messaged him back and she could probably be discharged to follow-up early next week. CT currently pending. Care to Dr. Matilde Sprang. Final Clinical Impression(s) / ED  Diagnoses Final diagnoses:  None    Rx / DC Orders ED Discharge Orders     None        Sherwood Gambler, MD 04/07/21 1520

## 2021-04-09 ENCOUNTER — Telehealth (HOSPITAL_COMMUNITY): Payer: Self-pay | Admitting: Physician Assistant

## 2021-04-09 ENCOUNTER — Encounter: Payer: Self-pay | Admitting: Podiatry

## 2021-04-09 ENCOUNTER — Other Ambulatory Visit: Payer: Self-pay

## 2021-04-09 ENCOUNTER — Encounter (HOSPITAL_COMMUNITY): Payer: Medicare Other

## 2021-04-09 ENCOUNTER — Ambulatory Visit (INDEPENDENT_AMBULATORY_CARE_PROVIDER_SITE_OTHER): Payer: Medicare Other | Admitting: Podiatry

## 2021-04-09 DIAGNOSIS — L03031 Cellulitis of right toe: Secondary | ICD-10-CM | POA: Diagnosis not present

## 2021-04-09 NOTE — Progress Notes (Signed)
  Subjective:  Patient ID: Joy Williams, female    DOB: 02/17/1951,   MRN: [id number]  No chief complaint on file.   70 y.o. female presents as follow-up from emergency room for cellulitis of right great toe. Relates her dog may have stepped on her toe and since has had pain swelling and redness in the toe. Was seen in urgent care and then the Emergency room on Saturday. Was placed on antibiotics and relates improvement. CT was negative for osteo . Denies any other pedal complaints. Denies n/v/f/c.   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 [facility] (had to have botox injection)   COPD (chronic obstructive pulmonary disease) ([place])    Family history of adverse reaction to anesthesia    Mother- very sensitive to medication   Heart murmur    MVP   Hypothyroidism    Lung cancer ([place]) 12/30/2014   Synchronous primaries:  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, each treated by wedge resection with post-op adjuvant [facility]Rx at [facility]   MVP (mitral valve prolapse) 2018   s/p Minimally-Invasive Mitral Valve Replacement w/  [facility] Mitral bovine bioprosthetic tissue valve (size 34mm, model # 7300TFX, serial # [id number])   PONV (postoperative nausea and vomiting)    Raynaud's syndrome    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   31 mm [facility] mitral bovine bioprosthetic tissue valve placed via right mini thoracotomy approach   Severe mitral regurgitation 11/15/2014   Shortness of breath dyspnea    with exertion   STD (sexually transmitted disease)    STEMI (ST elevation myocardial infarction) ([place]) 10/24/2020   Telangiectasia     Objective:  Physical  Exam: Vascular: DP/PT pulses 2/4 bilateral. CFT <3 seconds. Normal hair growth on digits. No edema.  Skin. No lacerations or abrasions bilateral feet. Edema and erythema noted to right great toe. No incurvation of the right great toenail noted. Erythema is more proximal to the nail bed. Has improved.  Musculoskeletal: MMT 5/5 bilateral lower extremities in DF, PF, Inversion and Eversion. Deceased ROM in DF of ankle joint.  Neurological: Sensation intact to light touch.   Assessment:   1. Cellulitis of right toe      Plan:  Patient was evaluated and treated and all questions answered. Cellulitis right big toe -Discussed finiding of CT with patient and no osteomyeliits present. Discussed treatment options continuing antibiotics vs removing nail and antibiotics. At this time feel continuin with abx for next week and re-evaluating will be a good plan for her. She has had improvement on the antibiotics.  -Continue abs.   -Discussed if any worsening redness, pain, fever or chills to call or may need to report to the emergency room. Patient expressed understanding.     Return in about 1 week (around 04/16/2021) for cellulitis check .   Lorenda Peck, DPM

## 2021-04-11 ENCOUNTER — Encounter (HOSPITAL_COMMUNITY): Payer: Medicare Other

## 2021-04-11 ENCOUNTER — Telehealth (HOSPITAL_COMMUNITY): Payer: Self-pay | Admitting: Physician Assistant

## 2021-04-11 NOTE — Progress Notes (Signed)
Discharge Progress Report  Patient Details  Name: Joy Williams MRN: [id number] Date of Birth: Sep 29, 1950 Referring Provider:   Flowsheet Row CARDIAC REHAB PHASE II ORIENTATION from 02/13/2021 in [place]  Referring Provider Gwyndolyn Kaufman, MD        Number of Visits: 17  Reason for Discharge:  Patient reached a stable level of exercise. Patient has met program and personal goals.  Smoking History:  Social History   Tobacco Use  Smoking Status Some Days   Packs/day: 0.75   Years: 24.00   Pack years: 18.00   Types: Cigarettes   Start date: 05/21/1983  Smokeless Tobacco Never  Tobacco Comments   Currently smoking 3cigs per day as of 02/15/21    Diagnosis:  Takotsubo cardiomyopathy  Non-ST elevated myocardial infarction ([place]) 02/2019  ADL UCSD:   Initial Exercise Prescription:  Initial Exercise Prescription - 02/13/21 1100       Date of Initial Exercise RX and Referring Provider   Date 02/13/21    Referring Provider Gwyndolyn Kaufman, MD    Expected Discharge Date 02/09/21      NuStep   Level 2    SPM 85    Minutes 15    METs 2.2      Track   Laps 13    Minutes 15    METs 2.51      Prescription Details   Frequency (times per week) 3    Duration Progress to 30 minutes of continuous aerobic without signs/symptoms of physical distress      Intensity   THRR 40-80% of Max Heartrate 60-121    Ratings of Perceived Exertion 11-13    Perceived Dyspnea 0-4      Progression   Progression Continue progressive overload as per policy without signs/symptoms or physical distress.      Resistance Training   Training Prescription Yes    Weight 2 lbs    Reps 10-15             Discharge Exercise Prescription (Final Exercise Prescription Changes):  Exercise Prescription Changes - 04/06/21 1032       Response to Exercise   Blood Pressure (Admit) 122/82    Blood Pressure (Exercise) 108/62    Blood Pressure (Exit)  94/62    Heart Rate (Admit) 70 bpm    Heart Rate (Exercise) 99 bpm    Heart Rate (Exit) 79 bpm    Rating of Perceived Exertion (Exercise) 11    Symptoms None    Duration Continue with 30 min of aerobic exercise without signs/symptoms of physical distress.    Intensity THRR unchanged      Progression   Progression Continue to progress workloads to maintain intensity without signs/symptoms of physical distress.    Average METs 2.8      Resistance Training   Training Prescription Yes    Weight 3 lbs    Reps 10-15    Time 10 Minutes      Interval Training   Interval Training No      NuStep   Level 3    SPM 90    Minutes 15    METs 2.6      Track   Laps 16    Minutes 13    METs 3.14      Home Exercise Plan   Plans to continue exercise at Home (comment)    Frequency Add 2 additional days to program exercise sessions.    Initial Home Exercises Provided  03/07/21             Functional Capacity:  6 Minute Walk     Row Name 02/13/21 0853 04/04/21 1045       6 Minute Walk   Phase Initial Discharge    Distance 1450 feet 1745 feet    Distance % Change -- 20.34 %    Distance Feet Change -- 295 ft    Walk Time 6 minutes 6 minutes    # of Rest Breaks 0 0    MPH 2.75 3.3    METS 3.35 3.85    RPE 11 11    Perceived Dyspnea  0 0    VO2 Peak 11.72 13.48    Symptoms Yes (comment) No    Comments Bilateral Hip pain 2/10 --    Resting HR 88 bpm 69 bpm    Resting BP 126/82 148/82    Resting Oxygen Saturation  98 % 99 %    Exercise Oxygen Saturation  during 6 min walk 98 % 100 %    Max Ex. HR 94 bpm 88 bpm    Max Ex. BP 134/65 142/78    2 Minute Post BP 126/76 112/68             Psychological, QOL, Others - Outcomes: PHQ 2/9: Depression screen [facility] 2/9 03/06/2021 02/13/2021 12/19/2020 11/30/2020 08/17/2020  Decreased Interest 0 0 0 0 0  Down, Depressed, Hopeless 0 0 0 0 0  PHQ - 2 Score 0 0 0 0 0  Altered sleeping 0 - 1 0 0  Tired, decreased energy 0 - 0 0 0   Change in appetite 0 - 0 0 0  Feeling bad or failure about yourself  0 - 0 0 0  Trouble concentrating 0 - 0 0 0  Moving slowly or fidgety/restless 0 - 0 0 0  Suicidal thoughts 0 - 0 0 0  PHQ-9 Score 0 - 1 0 0  Difficult doing work/chores - - Somewhat difficult - -  Some recent data might be hidden    Quality of Life:  Quality of Life - 02/13/21 1002       Quality of Life   Select Quality of Life      Quality of Life Scores   Health/Function Pre 20.97 %    Socioeconomic Pre 22.8 %    Psych/Spiritual Pre 24.86 %    Family Pre 24.9 %    GLOBAL Pre 22.56 %             Personal Goals: Goals established at orientation with interventions provided to work toward goal.  Personal Goals and Risk Factors at Admission - 02/13/21 1004       Core Components/Risk Factors/Patient Goals on Admission    Weight Management Weight Maintenance    Tobacco Cessation Yes    Number of packs per day 2-3 cigarettes per day    Intervention Assist the participant in steps to quit. Provide individualized education and counseling about committing to Tobacco Cessation, relapse prevention, and pharmacological support that can be provided by physician.;Advice worker, assist with locating and accessing local/national Quit Smoking programs, and support quit date choice.    Expected Outcomes Short Term: Will demonstrate readiness to quit, by selecting a quit date.;Short Term: Will quit all tobacco product use, adhering to prevention of relapse plan.;Long Term: Complete abstinence from all tobacco products for at least 12 months from quit date.    Improve shortness of breath with ADL's Yes  Intervention Provide education, individualized exercise plan and daily activity instruction to help decrease symptoms of SOB with activities of daily living.    Expected Outcomes Short Term: Improve cardiorespiratory fitness to achieve a reduction of symptoms when performing ADLs    Heart Failure Yes     Intervention Provide a combined exercise and nutrition program that is supplemented with education, support and counseling about heart failure. Directed toward relieving symptoms such as shortness of breath, decreased exercise tolerance, and extremity edema.    Expected Outcomes Improve functional capacity of life;Short term: Attendance in program 2-3 days a week with increased exercise capacity. Reported lower sodium intake. Reported increased fruit and vegetable intake. Reports medication compliance.;Short term: Daily weights obtained and reported for increase. Utilizing diuretic protocols set by physician.;Long term: Adoption of self-care skills and reduction of barriers for early signs and symptoms recognition and intervention leading to self-care maintenance.    Hypertension Yes    Intervention Provide education on lifestyle modifcations including regular physical activity/exercise, weight management, moderate sodium restriction and increased consumption of fresh fruit, vegetables, and low fat dairy, alcohol moderation, and smoking cessation.;Monitor prescription use compliance.    Expected Outcomes Short Term: Continued assessment and intervention until BP is < 140/86m HG in hypertensive participants. < 130/853mHG in hypertensive participants with diabetes, heart failure or chronic kidney disease.;Long Term: Maintenance of blood pressure at goal levels.    Lipids Yes    Intervention Provide education and support for participant on nutrition & aerobic/resistive exercise along with prescribed medications to achieve LDL <7070mHDL >16m81m  Expected Outcomes Short Term: Participant states understanding of desired cholesterol values and is compliant with medications prescribed. Participant is following exercise prescription and nutrition guidelines.;Long Term: Cholesterol controlled with medications as prescribed, with individualized exercise RX and with personalized nutrition plan. Value goals: LDL <  70mg7mL > 40 mg.              Personal Goals Discharge:  Goals and Risk Factor Review     Row Name 02/19/21 1545 03/21/21 1725 04/27/21 1333         Core Components/Risk Factors/Patient Goals Review   Personal Goals Review Weight Management/Obesity;Heart Failure;Hypertension;Lipids;Tobacco Cessation Weight Management/Obesity;Heart Failure;Hypertension;Lipids;Tobacco Cessation Weight Management/Obesity;Heart Failure;Hypertension;Lipids;Tobacco Cessation     Review [name] started exercise at cardiac rehab on 02/19/21 and did fair with exercise. [name] did experience some orthostatic hypotenson. the PA was notfied. Enc to hydrate properly DebbiJackelyn Polingbeen doing well with exercise at cardiac rehab. Vital signs have been stable. DebbiJackelyn Polingnot felt dizzy during exercise on her recent sessions [name] did well with exercise at cardiac rehab. Vital signs were stable. [name] did not retrun to exercise at cardiac rehab after having cellulitis in her toe.     Expected Outcomes DebbiJackelyn Poling continue to partcipate in phase 2 cardiac rehab for exercise nutrition and lifestyle modifications. DebbiJackelyn Poling continue to partcipate in phase 2 cardiac rehab for exercise nutrition and lifestyle modifications. DebbiJackelyn Poling continue to  exercise as able follow nutrition and lifestyle modifications upon completion of phase 2 cardiac rehab.              Exercise Goals and Review:  Exercise Goals     Row Name 02/13/21 1031             Exercise Goals   Increase Physical Activity Yes       Intervention Provide advice, education, support and counseling about physical activity/exercise needs.;Develop an individualized exercise prescription for aerobic  and resistive training based on initial evaluation findings, risk stratification, comorbidities and participant's personal goals.       Expected Outcomes Short Term: Attend rehab on a regular basis to increase amount of physical activity.;Long Term: Add in home  exercise to make exercise part of routine and to increase amount of physical activity.;Long Term: Exercising regularly at least 3-5 days a week.       Increase Strength and Stamina Yes       Intervention Provide advice, education, support and counseling about physical activity/exercise needs.;Develop an individualized exercise prescription for aerobic and resistive training based on initial evaluation findings, risk stratification, comorbidities and participant's personal goals.       Expected Outcomes Short Term: Perform resistance training exercises routinely during rehab and add in resistance training at home;Short Term: Increase workloads from initial exercise prescription for resistance, speed, and METs.;Long Term: Improve cardiorespiratory fitness, muscular endurance and strength as measured by increased METs and functional capacity (6MWT)       Able to understand and use rate of perceived exertion (RPE) scale Yes       Intervention Provide education and explanation on how to use RPE scale       Expected Outcomes Short Term: Able to use RPE daily in rehab to express subjective intensity level;Long Term:  Able to use RPE to guide intensity level when exercising independently       Knowledge and understanding of Target Heart Rate Range (THRR) Yes       Intervention Provide education and explanation of THRR including how the numbers were predicted and where they are located for reference       Expected Outcomes Short Term: Able to state/look up THRR;Long Term: Able to use THRR to govern intensity when exercising independently;Short Term: Able to use daily as guideline for intensity in rehab       Understanding of Exercise Prescription Yes       Intervention Provide education, explanation, and written materials on patient's individual exercise prescription       Expected Outcomes Short Term: Able to explain program exercise prescription;Long Term: Able to explain home exercise prescription to exercise  independently                Exercise Goals Re-Evaluation:  Exercise Goals Re-Evaluation     Row Name 02/19/21 1221 03/07/21 1303 03/19/21 1015 04/04/21 1150 04/16/21 1652     Exercise Goal Re-Evaluation   Exercise Goals Review Increase Physical Activity;Increase Strength and Stamina;Knowledge and understanding of Target Heart Rate Range (THRR);Understanding of Exercise Prescription Increase Physical Activity;Increase Strength and Stamina;Able to understand and use rate of perceived exertion (RPE) scale;Knowledge and understanding of Target Heart Rate Range (THRR);Able to check pulse independently;Understanding of Exercise Prescription Increase Physical Activity;Increase Strength and Stamina;Able to understand and use rate of perceived exertion (RPE) scale;Knowledge and understanding of Target Heart Rate Range (THRR);Able to check pulse independently;Understanding of Exercise Prescription Increase Physical Activity;Increase Strength and Stamina;Able to understand and use rate of perceived exertion (RPE) scale;Knowledge and understanding of Target Heart Rate Range (THRR);Able to check pulse independently;Understanding of Exercise Prescription Increase Physical Activity;Increase Strength and Stamina;Able to understand and use rate of perceived exertion (RPE) scale;Knowledge and understanding of Target Heart Rate Range (THRR);Able to check pulse independently;Understanding of Exercise Prescription   Comments Pt's first day in the the [facility]2 program. Pt understnads the exercise Rx, THRR and RPE scale. Reviewed home exercise Rx with patient today. Encouraged to start walking at home 2 x/week. Pt can  break walks up into 3 - 10 min or 2 15 min bouts. Reviewed METs and goals. Pt is making progress toward goals. She is walking better and she completed 22 laps in 15 minutes today which is the most to date. Patient scheduled to complete the cardiac rehab program next week. Functional capacity increased 20% as  measured by 6MWT, strength increased 2% as measured by grip strength test. Patient is looking into where she might continue exercise either walking at home or at a fitness center. Will give patient information on local programs at next exercise session. Patient completed the cardiac rehab program on 04/06/2021 and progressed well achieving 2.9 METs with exercise.  Patient will continue exercise either walking at home or at a fitness center.   Expected Outcomes Will continue to montior patient and adjust exercise workloads as tolerated Pt will begin to walk at home. Will continue to montior and progress exercise workloads as tolerated. Patient will continue aerobic exercise routine to help maintain health and fitness gains. Patient will continue aerobic exercise routine to help maintain health and fitness gains.            Nutrition & Weight - Outcomes:  Pre Biometrics - 02/13/21 0957       Pre Biometrics   Waist Circumference 37 inches    Hip Circumference 39 inches    Waist to Hip Ratio 0.95 %    Triceps Skinfold 23 mm    % Body Fat 36.8 %    Grip Strength 24 kg    Flexibility 12.25 in    Single Leg Stand 5.56 seconds             Post Biometrics - 04/06/21 1032        Post  Biometrics   Height 5' 5.75" (1.67 m)    Waist Circumference 31.5 inches    Hip Circumference 38.75 inches    Waist to Hip Ratio 0.81 %    BMI (Calculated) 22.77    Triceps Skinfold 25 mm    % Body Fat 34.6 %    Grip Strength 24.5 kg    Flexibility 13.63 in    Single Leg Stand 11.25 seconds             Nutrition:   Nutrition Discharge:   Education Questionnaire Score:  Knowledge Questionnaire Score - 02/13/21 1003       Knowledge Questionnaire Score   Pre Score 22/28             Goals reviewed with patient; copy given to patient.Pt graduated from cardiac rehab program on 04/06/21  with completion of 17 exercise sessions in Phase II. Pt maintained good attendance and progressed  nicely during his participation in rehab as evidenced by increased MET level. Did not get to review medications or PHQ2-9 as [name] had cellulitis of her great toe.and did not return on her last scheduled week of exercise .  Pt has made significant lifestyle changes and should be commended for her success. Pt feels she has achieved her goals during cardiac rehab. [name] increased her distance on her post exercise walk test by 295 feet. We are proud of [name]'s progress!Harrell Gave RN BSN

## 2021-04-16 ENCOUNTER — Encounter: Payer: Self-pay | Admitting: Podiatry

## 2021-04-16 ENCOUNTER — Other Ambulatory Visit: Payer: Self-pay | Admitting: Podiatry

## 2021-04-16 ENCOUNTER — Telehealth: Payer: Medicare Other | Admitting: Medical-Surgical

## 2021-04-16 ENCOUNTER — Ambulatory Visit (INDEPENDENT_AMBULATORY_CARE_PROVIDER_SITE_OTHER): Payer: Medicare Other | Admitting: Podiatry

## 2021-04-16 ENCOUNTER — Other Ambulatory Visit: Payer: Self-pay

## 2021-04-16 DIAGNOSIS — L03031 Cellulitis of right toe: Secondary | ICD-10-CM

## 2021-04-16 DIAGNOSIS — L600 Ingrowing nail: Secondary | ICD-10-CM | POA: Diagnosis not present

## 2021-04-16 MED ORDER — CEFADROXIL 500 MG PO CAPS: 500.0000 mg | ORAL_CAPSULE | Freq: Two times a day (BID) | ORAL | Status: DC

## 2021-04-16 NOTE — Patient Instructions (Addendum)

## 2021-04-16 NOTE — Progress Notes (Signed)
Subjective:  Patient ID: Joy Williams, female    DOB: 09/26/1950,   MRN: [id number]  Chief Complaint  Patient presents with   Foot Pain    Mainly hurts on the right big toe on the front side and I have finished all the antibiotics and there is little draining      70 y.o. female presents as follow-up for right great toe cellulits. Relates antibiotics have helped but still has soreness to the lateral border of the toe.  Denies any other pedal complaints. Denies n/v/f/c.   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 [facility] (had to have botox injection)   COPD (chronic obstructive pulmonary disease) ([place])    Family history of adverse reaction to anesthesia    Mother- very sensitive to medication   Heart murmur    MVP   Hypothyroidism    Lung cancer ([place]) 12/30/2014   Synchronous primaries:  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, each treated by wedge resection with post-op adjuvant chemoRx at [facility]   MVP (mitral valve prolapse) 2018   s/p Minimally-Invasive Mitral Valve Replacement w/  [facility] Mitral bovine bioprosthetic tissue valve (size 24mm, model # 7300TFX, serial # [id number])   PONV (postoperative nausea and vomiting)    Raynaud's syndrome    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 CFT <3 seconds. Normal hair growth on digits. No edema.  Skin. No lacerations or abrasions bilateral feet. Edema and erythema noted to right great toe. No incurvation of the right great toenail noted. Erythema is more proximal to the nail bed. Has improved.  Musculoskeletal: MMT 5/5 bilateral lower extremities in DF, PF, Inversion and Eversion. Deceased ROM in DF of ankle joint.  Neurological: Sensation intact to light touch.   Assessment:   No diagnosis found.    Plan:  Patient was evaluated and treated and all questions answered. Cellulitis right big toe -Discussed finiding of CT with patient and no osteomyeliits present. Discussed treatment options continuing antibiotics vs removing nail and antibiotics. Has had improvement with antibiotics but still sore to lateral nail plate.  -Patient requesting removal of ingrown nail today. Procedure below.  Discussed procedure and post procedure care and patient expressed understanding.  Will follow-up in 2 weeks for nail check or sooner if any problems arise.    Procedure:  Procedure: partial Nail Avulsion of right hallux lateral nail border.  Surgeon: Lorenda Peck, DPM  Pre-op Dx: Ingrown toenail with infection Post-op: Same  Place of Surgery: Office exam room.  Indications for surgery: Painful and ingrown toenail.    The patient is requesting removal of nail without chemical matrixectomy. Risks and complications were discussed with the patient for which they understand and written consent was  obtained. Under sterile conditions a total of 3 mL of 1:1 mixture 0.5% marcaine plain and 1% lidocaine plain was infiltrated in a hallux block fashion. Once anesthetized, the skin was prepped in sterile fashion. A tourniquet was then applied. Next the latearl aspect of hallux nail border was then sharply excised making sure to remove the entire offending nail border.  Area copiously irrigated. Silvadene was applied. A dry sterile  dressing was applied. After application of the dressing the tourniquet was removed and there is found to be an immediate capillary refill time to the digit. The patient tolerated the procedure well without any complications. Post procedure instructions were discussed the patient for which he verbally understood. Follow-up in two weeks for nail check or sooner if any problems are to arise. Discussed signs/symptoms of infection and directed to call the office immediately should any occur or go directly to the emergency room. In the meantime, encouraged to call the office with any questions, concerns, changes symptoms.     No follow-ups on file.   Lorenda Peck, DPM

## 2021-04-17 ENCOUNTER — Telehealth: Payer: Self-pay | Admitting: Podiatry

## 2021-04-17 ENCOUNTER — Other Ambulatory Visit: Payer: Self-pay | Admitting: Podiatry

## 2021-04-17 DIAGNOSIS — L03031 Cellulitis of right toe: Secondary | ICD-10-CM

## 2021-04-17 MED ORDER — CEFADROXIL 500 MG PO CAPS: 500.0000 mg | ORAL_CAPSULE | Freq: Two times a day (BID) | ORAL | Status: DC

## 2021-04-17 NOTE — Telephone Encounter (Signed)
Patient called back to check on prescription -cefadroxil (DURICEF) capsule 500 mg - she said it was suppose to be sent to [facility]? Please advise

## 2021-04-17 NOTE — Telephone Encounter (Signed)
Called patient back and let her know that medication has been called in

## 2021-04-18 ENCOUNTER — Other Ambulatory Visit: Payer: Self-pay | Admitting: Podiatry

## 2021-04-18 ENCOUNTER — Telehealth: Payer: Self-pay | Admitting: Podiatry

## 2021-04-18 MED ORDER — CEFADROXIL 500 MG PO CAPS
500.0000 mg | ORAL_CAPSULE | Freq: Two times a day (BID) | ORAL | 0 refills | Status: AC
Start: 2021-04-18 — End: 2021-04-25

## 2021-04-18 NOTE — Telephone Encounter (Signed)
Patient did not received medication at her pharmacy yesterday.  Is there anyway Dr. Blenda Mounts can resend cefadroxil (DURICEF) capsule 500 mg

## 2021-04-23 ENCOUNTER — Encounter: Payer: Self-pay | Admitting: Cardiology

## 2021-04-23 ENCOUNTER — Encounter: Payer: Self-pay | Admitting: Internal Medicine

## 2021-04-24 NOTE — Telephone Encounter (Signed)
MR, please see mychart message from pt and advise: [name]  [facility] (supporting Brand Males, MD) 22 hours ago (10:49 AM)   I have been having bouts of high blood pressure which has been very uncomfortable.   I know this can be a side effect of Ofev.  Should I make an appointment with you?  I've also reached out to my cardiologist    Thanks Deb

## 2021-04-24 NOTE — Telephone Encounter (Signed)
This is the 2nd time I am comng across high bP with ofev and both only this year since ofev was approved in 2014.   Plan  - how high was BP pre ofev and how high is it running now?

## 2021-04-30 ENCOUNTER — Ambulatory Visit: Payer: Medicare Other | Admitting: Podiatry

## 2021-04-30 ENCOUNTER — Encounter: Payer: Self-pay | Admitting: Podiatry

## 2021-04-30 ENCOUNTER — Telehealth: Payer: Self-pay | Admitting: Podiatry

## 2021-04-30 NOTE — Telephone Encounter (Signed)
Patient stated that her nail is doing much better and wanted to know if she needed to come in for her nail check.

## 2021-05-01 NOTE — Telephone Encounter (Signed)
Please advise 

## 2021-05-02 ENCOUNTER — Telehealth: Payer: Self-pay

## 2021-05-02 NOTE — Telephone Encounter (Signed)
Call made to conduct screening for transportation needs. Shared information with patient but she stated that she did not need additional resources as she has a vehicle and drives herself.

## 2021-05-15 ENCOUNTER — Other Ambulatory Visit: Payer: Self-pay | Admitting: Medical

## 2021-06-08 ENCOUNTER — Telehealth: Payer: Self-pay | Admitting: Pharmacist

## 2021-06-08 DIAGNOSIS — Z7189 Other specified counseling: Secondary | ICD-10-CM

## 2021-06-08 DIAGNOSIS — Z5181 Encounter for therapeutic drug level monitoring: Secondary | ICD-10-CM

## 2021-06-08 DIAGNOSIS — J849 Interstitial pulmonary disease, unspecified: Secondary | ICD-10-CM

## 2021-06-08 NOTE — Telephone Encounter (Signed)
Error

## 2021-06-08 NOTE — Telephone Encounter (Signed)
Patient reached out regarding Ofev refill. Sent MyChart message reply to determine if she has received renewal application or renewal confirmation for [employer] prior to sending refill to [place]  She is also overdue for [facility] lab monitoring. Future lab order placed today for CBC and CMET.  Knox Saliva, PharmD, MPH, BCPS Clinical Pharmacist (Rheumatology and Pulmonology)

## 2021-06-08 NOTE — Telephone Encounter (Signed)
I'm running low on the Ofev.  How do I get more?  Please advise

## 2021-06-11 NOTE — Telephone Encounter (Signed)
Provider portion of [employer] PAP RENEWAL application for Ofev placed in Dr. Golden Pop folder.  Patient portion placed up front. Patient to stop by clinic on Wednesday, 06/13/21 to have labs completed and has been advised to complete application and bring income documents.  Knox Saliva, PharmD, MPH, BCPS Clinical Pharmacist (Rheumatology and Pulmonology)

## 2021-06-13 ENCOUNTER — Other Ambulatory Visit (HOSPITAL_COMMUNITY): Payer: Self-pay

## 2021-06-14 ENCOUNTER — Other Ambulatory Visit (INDEPENDENT_AMBULATORY_CARE_PROVIDER_SITE_OTHER): Payer: Medicare Other

## 2021-06-14 DIAGNOSIS — Z5181 Encounter for therapeutic drug level monitoring: Secondary | ICD-10-CM | POA: Diagnosis not present

## 2021-06-14 DIAGNOSIS — J849 Interstitial pulmonary disease, unspecified: Secondary | ICD-10-CM

## 2021-06-14 LAB — COMPREHENSIVE METABOLIC PANEL
ALT: 22 U/L (ref 0–35)
AST: 30 U/L (ref 0–37)
Albumin: 4.4 g/dL (ref 3.5–5.2)
Alkaline Phosphatase: 53 U/L (ref 39–117)
BUN: 9 mg/dL (ref 6–23)
CO2: 29 mEq/L (ref 19–32)
Calcium: 9.7 mg/dL (ref 8.4–10.5)
Chloride: 95 mEq/L — ABNORMAL LOW (ref 96–112)
Creatinine, Ser: 0.64 mg/dL (ref 0.40–1.20)
GFR: 89.59 mL/min (ref >60.00)
Glucose, Bld: 75 mg/dL (ref 70–99)
Potassium: 3.8 mEq/L (ref 3.5–5.1)
Sodium: 132 mEq/L — ABNORMAL LOW (ref 135–145)
Total Bilirubin: 0.6 mg/dL (ref 0.2–1.2)
Total Protein: 7.1 g/dL (ref 6.0–8.3)

## 2021-06-14 LAB — CBC WITH DIFFERENTIAL/PLATELET
Basophils Absolute: 0 10*3/uL (ref 0.0–0.1)
Basophils Relative: 0.1 % (ref 0.0–3.0)
Eosinophils Absolute: 0 10*3/uL (ref 0.0–0.7)
Eosinophils Relative: 0.5 % (ref 0.0–5.0)
HCT: 40.2 % (ref 36.0–46.0)
Hemoglobin: 13.2 g/dL (ref 12.0–15.0)
Lymphocytes Relative: 18.4 % (ref 12.0–46.0)
Lymphs Abs: 1.2 10*3/uL (ref 0.7–4.0)
MCHC: 32.7 g/dL (ref 30.0–36.0)
MCV: 87.7 fl (ref 78.0–100.0)
Monocytes Absolute: 0.4 10*3/uL (ref 0.1–1.0)
Monocytes Relative: 6.5 % (ref 3.0–12.0)
Neutro Abs: 4.9 10*3/uL (ref 1.4–7.7)
Neutrophils Relative %: 74.5 % (ref 43.0–77.0)
Platelets: 209 10*3/uL (ref 150.0–400.0)
RBC: 4.59 Mil/uL (ref 3.87–5.11)
RDW: 14.4 % (ref 11.5–15.5)
WBC: 6.6 10*3/uL (ref 4.0–10.5)

## 2021-06-14 NOTE — Telephone Encounter (Signed)
Submitted Patient Assistance Application to [facility] [employer] for OFEV along with provider portion, patient portion, insurance card copy, and income documents. Will update patient when we receive a response.  Fax# [phone] Phone# [phone]  Knox Saliva, PharmD, MPH, BCPS Clinical Pharmacist (Rheumatology and Pulmonology)

## 2021-07-01 NOTE — Progress Notes (Signed)
Cardiology Office Note:    Date:  07/03/2021   ID:  KERSTEN SALMONS, DOB 15-Jul-1950, MRN [id number]  PCP:  Lorrene Reid, PA-C   [facility] Providers Cardiologist:  Freada Bergeron, MD {   Referring MD: Lorrene Reid, PA-C    History of Present Illness:    Joy Williams is a 71 y.o. female with a hx of MVP s/p minimally invasive bioprosthetic MVR, carotid artery disease s/p R CEA, scleroderma, cold agglutinin disease, raynaud's disease, UIP, NSCLC, COPD, hypothyroidism and recent admission for STEMI found to have clean coronaries on cath with diagnosis of Takostubo CM discharged on 10/29/20 and recent readmission for dizziness on 11/03/20 who now presents to clinic for follow-up.  Patient was admitted 02/24/19-02/25/19 with chest discomfort.  High-sensitivity troponin levels were elevated (1471 >> 1625 >> 1246 >> 1150).  There was concern for NSTEMI., however, cath demonstrated no CAD.  TTE demonstrated normal LV function and normally functioning mitral valve prosthesis.  She was followed by Dr. Debara Pickett in the hospital and he felt that her presentation was c/w [facility] (myocardial infarction with non-obstructive coronary arteries) or non-ACS troponin elevation.  The possible causes included myocarditis, coronary spasm, cardioembolic event. Underwent cardiac MRI that showed focal subendocardial LGE into mid inferolateral wall consistent with infarct seen on territory with concern for a thromboembolic event given clean cath. She was managed medically and worked with cardiac rehab at that time.   In November of 2020, she developed palpitations. 4 week zio placed with brief runs of SVT but no significant arrhythmias or Afib   Re-admitted to [facility] from 08/06/20-08/10/20 where she presented for acute onset dizziness, left arm incoordination and HA. CT head negative for acute abnormalities. MRI brain without acute infarct but showed multiple scattered remote lacunar infarcts. 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 TTE with EF 65 to 70%, NWMA, mild LVH, moderate left atrial size dilated, mild MVR, no source of emboli noted. She underwent right CEA on 08/09/20 with [name]. She was continued on ASA, plavix and statin.  Re-admitted again from 10/24/20-10/29/20 where she presented with chest pain and nausea found to have STE in I and aVL for which a STEMI was called. She was taken to the cath lab which showed 25% pLAD stenosis, otherwise normal coronary arteries, and EF 25-35% with LV gram c/w takotsubo cardiomyopathy. She was started on plavix. TTE showed EF 25-30%, global hypokinesis, G1DD, moderately elevated PA pressures, moderate LAE, and normal structure/function of mitral valve s/p repair/replacement. GDMT was limited due to hypotension and she was discharged on 10/29/20.  Re-presented to the ER on 11/03/20 with dizziness and orthostatic symptoms. Trop 41-->44 which was down from prior admission. Symptoms thought to be due to orthostasis and farxiga discontinued.  Saw [facility] on 11/06/20 where she was doing better. Dizziness improved. Bedside echo with improved LVEF.  She was seen 11/15/2020 where she was feeling much improved since last admission.  Repeat echo 11/22/2020 with LVEF returned to normal 60 to 65%, mild LVH, trivial central MR with no stenosis.  Last saw Laurann [facility] where she was doing well.   Today, the patient feels well. No chest pain, SOB, orthopnea, or PND. No lightheadedness, dizziness or syncope. Has started Ofev for her scleroderma which has helped some.     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 [facility] (had to have botox injection)   COPD (chronic obstructive pulmonary disease) ([place])    Family history of adverse reaction to anesthesia    Mother- very sensitive to medication   Heart murmur    MVP   Hypothyroidism    Lung cancer ([place]) 12/30/2014   Synchronous primaries:  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, each treated by wedge resection with post-op adjuvant [facility] at [facility]   MVP (mitral valve prolapse) 2018   s/p Minimally-Invasive Mitral Valve Replacement w/  [facility] Mitral bovine bioprosthetic tissue valve (size 29mm, model # 7300TFX, serial # [id number])   PONV (postoperative nausea and vomiting)    Raynaud's syndrome    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   31 mm [facility] mitral bovine bioprosthetic tissue valve placed via right mini thoracotomy approach   Severe mitral regurgitation 11/15/2014   Shortness of breath dyspnea    with exertion   STD (sexually transmitted disease)    STEMI (ST elevation myocardial infarction) ([place]) 10/24/2020   Telangiectasia     Past Surgical History:  Procedure Laterality Date   BACK SURGERY     x 3  Disectomy   BREAST BIOPSY Left    CARDIAC CATHETERIZATION N/A 03/08/2016   Procedure: Right/Left Heart Cath and Coronary Angiography;  Surgeon: Sherren Mocha, MD;  Location: [place] [facility];  Service: Cardiovascular;  Laterality: N/A;   CLAVICLE SURGERY Left 2013   plate to left collar bone   ENDARTERECTOMY Right 08/09/2020   Procedure: RIGHT CAROTID ENDARTERECTOMY;  Surgeon: Waynetta Sandy, MD;  Location: [place];  Service: Vascular;  Laterality: Right;   LAPAROSCOPY     ? reason-age 5    LEFT HEART CATH AND CORONARY ANGIOGRAPHY N/A 02/25/2019   Procedure: LEFT HEART CATH AND CORONARY ANGIOGRAPHY;  Surgeon: Jettie Booze, MD;  Location: [place] [facility];  Service:  Cardiovascular;  Laterality: N/A;   LEFT HEART CATH AND CORONARY ANGIOGRAPHY N/A 10/24/2020   Procedure: LEFT HEART CATH AND CORONARY ANGIOGRAPHY;  Surgeon: Jettie Booze, MD;  Location: [place] [facility];  Service: Cardiovascular;  Laterality: N/A;   LUNG CANCER SURGERY     MITRAL VALVE REPAIR Right 06/27/2016   Procedure: MINIMALLY INVASIVE MITRAL VALVE REPLACEMENT;  Surgeon: Rexene Alberts, MD;  Location: [place];  Service: Open Heart Surgery;  Laterality: Right;   TEE WITHOUT CARDIOVERSION N/A 02/22/2016   Procedure: TRANSESOPHAGEAL ECHOCARDIOGRAM (TEE);  Surgeon: Dorothy Spark, MD;  Location: [place];  Service: Cardiovascular;  Laterality: N/A;   TEE WITHOUT CARDIOVERSION N/A 06/27/2016   Procedure: TRANSESOPHAGEAL ECHOCARDIOGRAM (TEE);  Surgeon: Rexene Alberts, MD;  Location: [place];  Service: Open Heart Surgery;  Laterality: N/A;   TONSILLECTOMY     VIDEO ASSISTED THORACOSCOPY (VATS)/WEDGE RESECTION Left 12/30/2014   Bronchoscopy, Mediastinoscopy, Left VATS for Wedge resection LUL x2 adn LLL x1 - Dr. Elenor Quinones at [facility] Left 2017   injected with botox   wrist surgery Left 2015   plate to wrist     Current Medications: Current  Meds  Medication Sig   acetaminophen (TYLENOL) 500 MG tablet Take 1,000 mg by mouth every 6 (six) hours as needed for mild pain or headache.   Cholecalciferol (VITAMIN D) 125 MCG (5000 UT) CAPS Take 5,000 Units by mouth daily.   clopidogrel (PLAVIX) 75 MG tablet Take 1 tablet (75 mg total) by mouth daily.   EPINEPHrine (EPIPEN 2-PAK) 0.3 mg/0.3 mL IJ SOAJ injection Inject 0.3 mg into the muscle as needed for anaphylaxis.   ferrous sulfate 325 (65 FE) MG tablet TAKE 1 TABLET BY MOUTH EVERY DAY   levothyroxine (SYNTHROID) 175 MCG tablet TAKE 1 TABLET BY MOUTH EVERY DAY BEFORE BREAKFAST   MELATONIN PO Take 1 tablet by mouth at bedtime as needed (sleep).   Multiple Vitamins-Minerals (MULTIVITAMIN WITH MINERALS) tablet Take 1 tablet by  mouth daily.   nicotine (NICODERM CQ - DOSED IN MG/24 HR) 7 mg/24hr patch Place 1 patch (7 mg total) onto the skin daily.   Nintedanib (OFEV) 150 MG CAPS Take 150 mg by mouth 2 (two) times daily.   rosuvastatin (CRESTOR) 20 MG tablet Take 1 tablet (20 mg total) by mouth daily. (Patient taking differently: Take 20 mg by mouth every evening.)   TRELEGY ELLIPTA 100-62.5-25 MCG/INH AEPB Inhale 1 puff into the lungs daily.   Turmeric 500 MG CAPS Take 500 mg by mouth daily.   vitamin B-12 (CYANOCOBALAMIN) 1000 MCG tablet Take 1 tablet (1,000 mcg total) by mouth daily.     Allergies:   Penicillins and Bee venom   Social History   Socioeconomic History   Marital status: Divorced    Spouse name: Not on file   Number of children: Not on file   Years of education: 16   Highest education level: Not on file  Occupational History   Occupation: Retired  Tobacco Use   Smoking status: Some Days    Packs/day: 0.75    Years: 24.00    Pack years: 18.00    Types: Cigarettes    Start date: 05/21/1983   Smokeless tobacco: Never   Tobacco comments:    Currently smoking 3cigs per day as of 02/15/21  Vaping Use   Vaping Use: Never used  Substance and Sexual Activity   Alcohol use: Yes    Alcohol/week: 2.0 standard drinks    Types: 1 Shots of liquor, 1 Standard drinks or equivalent per week    Comment: occ   Drug use: No   Sexual activity: Not Currently    Partners: Male    Birth control/protection: Post-menopausal  Other Topics Concern   Not on file  Social History Narrative   Not on file   Social Determinants of Health   Financial Resource Strain: Low Risk    Difficulty of Paying Living Expenses: Not very hard  Food Insecurity: No Food Insecurity   Worried About Charity fundraiser in the Last Year: Never true   Arboriculturist in the Last Year: Never true  Transportation Needs: Unmet Transportation Needs   Lack of Transportation (Medical): No   Lack of Transportation (Non-Medical): Yes   Physical Activity: Not on file  Stress: Not on file  Social Connections: Not on file     Family History: The patient's family history includes Dementia in her father; Healthy in her daughter and son; Mitral valve prolapse in her brother, mother, and sister; Prostate cancer in her father. There is no history of Colon cancer, Colon polyps, Rectal cancer, Esophageal cancer, Stomach cancer, or Heart attack.  ROS:   Please see the history of present illness.    Review of Systems  Constitutional:  Negative for chills and fever.  HENT:  Negative for hearing loss.   Eyes:  Negative for blurred vision.  Respiratory:  Negative for shortness of breath.   Cardiovascular:  Negative for chest pain, palpitations, orthopnea, claudication, leg swelling and PND.  Gastrointestinal:  Negative for blood in stool, nausea and vomiting.  Genitourinary:  Negative for dysuria and hematuria.  Musculoskeletal:  Negative for falls.  Neurological:  Negative for dizziness and loss of consciousness.  Endo/Heme/Allergies:  Bruises/bleeds easily.  Psychiatric/Behavioral:  Negative for substance abuse.     EKGs/Labs/Other Studies Reviewed:    The following studies were reviewed today: Echo 11/22/20  1. Left ventricular ejection fraction, by estimation, is 60 to 65%. Left  ventricular ejection fraction by 3D volume is 62 %. The left ventricle has  normal function. There is mild concentric left ventricular hypertrophy.  Left ventricular diastolic  function could not be evaluated.   2. Right ventricular systolic function is normal. The right ventricular  size is normal. There is normal pulmonary artery systolic pressure. The  estimated right ventricular systolic pressure is 38.9 mmHg.   3. Left atrial size was mild to moderately dilated.   4. 31 mm Magna Ease in the mitral position 06/27/2016. MG 2 mmHG @ 65 bpm.  EOA 2.27 cm2. Trivial central MR. No stenosis. Normal prosthesis. Trivial  mitral valve regurgitation.  The mean mitral valve gradient is 2.0 mmHg  with average heart rate of 65 bpm.  There is a 31 mm bioprosthetic valve present in the mitral position.  Procedure Date: 06/27/16.   5. The aortic valve is tricuspid. Aortic valve regurgitation is not  visualized. No aortic stenosis is present.   6. The inferior vena cava is normal in size with greater than 50%  respiratory variability, suggesting right atrial pressure of 3 mmHg.   Comparison(s): Changes from prior study are noted. EF is now 60-65%. WMA  have resolved.  LHC 10/24/20: Prox LAD lesion is 25% stenosed. No significant CAD. The left ventricular systolic function is normal. LV end diastolic pressure is mildly elevated. The left ventricular ejection fraction is 25-35% by visual estimate. There is no aortic valve stenosis. Prior mitral valve surgery.   Despite lateral ST elevation, we only found nonobstructive coronary artery disease.  Left ventriculogram fits with a pattern of Takotsubo cardiomyopathy.  LV function significantly different from her prior catheterization.  She will need aggressive medical therapy for LV dysfunction.   I discussed the findings with her daughter.    10/18/2020: IMPRESSION: 1. There is mild pulmonary fibrosis in a pattern with apical to basal gradient, featuring irregular peripheral interstitial opacity, septal thickening, traction bronchiectasis, and subpleural bronchiolectasis at the lung basis without clear evidence of honeycombing. Fibrotic findings are worsened over time on prior examinations dating back to 11/16/2014. Findings are categorized as probable UIP per consensus guidelines: Diagnosis of Idiopathic Pulmonary Fibrosis: An Official ATS/ERS/JRS/ALAT Clinical Practice Guideline. [place], Iss 5, [phone], Jan 18 2017. 2. Redemonstrated postoperative findings of anterior left upper lobe and dependent left lower lobe wedge resections. No evidence of malignant  recurrence in the chest. 3. Coronary artery disease.   Aortic Atherosclerosis (ICD10-I70.0).   Echocardiogram 10/25/20: 1. Left ventricular ejection fraction, by estimation, is 25 to 30%. The  left ventricle has severely decreased function. The left ventricle  demonstrates global hypokinesis. Left ventricular diastolic parameters  are  consistent with Grade I diastolic  dysfunction (impaired relaxation).   2. Right ventricular systolic function is normal. The right ventricular  size is normal. There is moderately elevated pulmonary artery systolic  pressure. The estimated right ventricular systolic pressure is 16.0 mmHg.   3. Left atrial size was moderately dilated.   4. The mitral valve has been repaired/replaced. No evidence of mitral  valve regurgitation. No evidence of mitral stenosis. Severe mitral annular  calcification. Echo findings are consistent with normal structure and  function of the mitral valve  prosthesis.   5. The aortic valve is normal in structure. Aortic valve regurgitation is  not visualized. No aortic stenosis is present.   6. The inferior vena cava is normal in size with greater than 50%  respiratory variability, suggesting right atrial pressure of 3 mmHg.   Comparison(s): Prior images unable to be directly viewed, comparison made  by report only. The left ventricular function is worsened.  EKG:  No new tracing.  Recent Labs: 10/25/2020: Magnesium 1.9; TSH 0.478 06/14/2021: ALT 22; BUN 9; Creatinine, Ser 0.64; Hemoglobin 13.2; Platelets 209.0; Potassium 3.8; Sodium 132  Recent Lipid Panel    Component Value Date/Time   CHOL 135 10/24/2020 1545   CHOL 150 11/11/2018 0946   CHOL 177 10/28/2014 1025   TRIG 100 10/24/2020 1545   TRIG 115 10/28/2014 1025   HDL 60 10/24/2020 1545   HDL 70 11/11/2018 0946   HDL 70 10/28/2014 1025   CHOLHDL 2.3 10/24/2020 1545   VLDL 20 10/24/2020 1545   LDLCALC 55 10/24/2020 1545   LDLCALC 64 11/11/2018 0946   LDLCALC 84  10/28/2014 1025   LDLDIRECT 63 06/15/2020 1446         Physical Exam:    VS:  BP 122/72    Pulse 75    Ht 5' 6.5" (1.689 m)    Wt 138 lb (62.6 kg)    SpO2 100%    BMI 21.94 kg/m     Wt Readings from Last 3 Encounters:  07/03/21 138 lb (62.6 kg)  04/07/21 142 lb 3.2 oz (64.5 kg)  04/07/21 142 lb 3.2 oz (64.5 kg)     GEN: Comfortable, NAD HEENT: Normal NECK: No JVD; No carotid bruits CARDIAC: RRR, soft systolic murmur. No rubs or gallops RESPIRATORY:  Coarse crackles at the bases (unchanged). Otherwise clear. ABDOMEN: Soft, non-tender, non-distended MUSCULOSKELETAL:  No edema; No deformity  SKIN: Warm and dry NEUROLOGIC:  Alert and oriented x 3 PSYCHIATRIC:  Normal affect   ASSESSMENT:    1. Takotsubo cardiomyopathy   2. S/P mitral valve replacement with bioprosthetic valve   3. Nonobstructive atherosclerosis of coronary artery   4. History of mitral valve replacement   5. NSTEMI (non-ST elevated myocardial infarction) ([place])   6. Orthostatic hypertension   7. Hyperlipidemia LDL goal <70   8. TIA (transient ischemic attack)    PLAN:    In order of problems listed above:  #Non-Ischemic CM:  #Takotsubo cardiomyopathy:  Patient had a recent admission from 10/24/20-10/29/20 where she presented with chest pain, nausea, and vomiting and found to have anterolateral STE on EKG. HsTrop increased from 916>4990. She underwent emergent cardiac catheterization which showed 25% pLAD stenosis, otherwise normal coronary arteries, and EF 25-35% with LV gram c/w takotsubo cardiomyopathy. She was started on plavix for conservative management. TTE showed EF 25-30%, global hypokinesis, G1DD, moderately elevated PA pressures, moderate LAE, and normal structure/function of mitral valve s/p repair/replacement. GDMT has been limited by symptomatic  hypotension. Repeat echo 11/22/2020 with LVEF returned to normal 60 to 65%, mild LVH, trivial central MR with no stenosis. -TTE with improved EF 60-65% -  Unable to tolerate GDMT due to symptomatic hypotension - Continue plavix 75mg  daily (was on previously due to suspected thromboembolic NSTEMI in the past) - Monitor daily weights  #MVP c/b severe mitral regurgitation s/p bioprosthetic MVR in 2018:  Stable gradients on most recent TTE - Continue routine monitoring   #History of NSTEMI (non-ST elevated myocardial infarction)  Presented with chest pain in 2020. Cath without obstructive disease, however, MRI showed focal LGE in the mid inferolateral wall highly suspicious for thromboembolic event. TTE with LVEF was 60 to 65%. Completed cardiac rehab. Had mild troponin elevation with recent hospitalization with peak 140-->130. No anginal symptoms. Suspect demand in the setting of TIA. Given recent negative cath and absence of chest pain, no further work-up needed at this time. -Continue plavix 75mg  daily (favored over ASA given concern for TIA at that time) -Continue crestor 20mg  daily -Off BB due to orthostatsis -Stopped linisopril due to orthostasis  #Orthostatic Hypotension: Improved with cessation of antihypertensives and jardiance -Compression socks -Elevate legs at end of the day -Slow position changes   #Carotid artery disease s/p right CEA 07/2020: followed by [name] outpatient - Continue routine monitoring - Continue plavix 75mg  daily and crestor 20mg  daily as above   #Iron deficiency:  - Continue po iron - Follow-up with PCP for further management.     #UIP:  - Continue home trelegy - Continue to follow with pulmonology   #Hyperlipidemia LDL goal <70 LDL optimal on most recent lab work with LDL 63   -Change to crestor 20mg  daily as above  #Scleroderma: -Follow-up with Rheum as scheduled -Will repeat TTE for monitoring of RV/PA pressures  #Tobacco Abuse: Quit successfully. -Continue nicotine patches    Medication Adjustments/Labs and Tests Ordered: Current medicines are reviewed at length with the patient today.   Concerns regarding medicines are outlined above.  Orders Placed This Encounter  Procedures   ECHOCARDIOGRAM COMPLETE   No orders of the defined types were placed in this encounter.    Patient Instructions  Medication Instructions:   Your physician recommends that you continue on your current medications as directed. Please refer to the Current Medication list given to you today.  *If you need a refill on your cardiac medications before your next appointment, please call your pharmacy*   Testing/Procedures:  Your physician has requested that you have an echocardiogram. Echocardiography is a painless test that uses sound waves to create images of your heart. It provides your doctor with information about the size and shape of your heart and how well your hearts chambers and valves are working. This procedure takes approximately one hour. There are no restrictions for this procedure.  SCHEDULE TO BE DONE IN 5 1/2 MONTHS--NEEDS TO BE DONE PRIOR TO 6 MONTH OFFICE VISIT WITH DR. Johney Frame   Follow-Up: At [facility], you and your health needs are our priority.  As part of our continuing mission to provide you with exceptional heart care, we have created designated Provider Care Teams.  These Care Teams include your primary Cardiologist (physician) and Advanced Practice Providers (APPs -  Physician Assistants and Nurse Practitioners) who all work together to provide you with the care you need, when you need it.  We recommend signing up for the patient portal called "MyChart".  Sign up information is provided on this After Visit Summary.  MyChart  is used to connect with patients for Virtual Visits (Telemedicine).  Patients are able to view lab/test results, encounter notes, upcoming appointments, etc.  Non-urgent messages can be sent to your provider as well.   To learn more about what you can do with MyChart, go to [email or web address].    Your next appointment:   6 month(s)  The  format for your next appointment:   In Person  Provider:   Freada Bergeron, MD WITH AN ECHO PRIOR TO THIS VISIT      Signed, Freada Bergeron, MD  07/03/2021 10:38 AM

## 2021-07-03 ENCOUNTER — Other Ambulatory Visit: Payer: Self-pay

## 2021-07-03 ENCOUNTER — Ambulatory Visit (INDEPENDENT_AMBULATORY_CARE_PROVIDER_SITE_OTHER): Payer: Medicare Other | Admitting: Cardiology

## 2021-07-03 ENCOUNTER — Encounter: Payer: Self-pay | Admitting: Cardiology

## 2021-07-03 VITALS — BP 122/72 | HR 75 | Ht 66.5 in | Wt 138.0 lb

## 2021-07-03 DIAGNOSIS — I10 Essential (primary) hypertension: Secondary | ICD-10-CM | POA: Diagnosis not present

## 2021-07-03 DIAGNOSIS — G459 Transient cerebral ischemic attack, unspecified: Secondary | ICD-10-CM

## 2021-07-03 DIAGNOSIS — E785 Hyperlipidemia, unspecified: Secondary | ICD-10-CM

## 2021-07-03 DIAGNOSIS — I2510 Atherosclerotic heart disease of native coronary artery without angina pectoris: Secondary | ICD-10-CM

## 2021-07-03 DIAGNOSIS — I214 Non-ST elevation (NSTEMI) myocardial infarction: Secondary | ICD-10-CM | POA: Diagnosis not present

## 2021-07-03 DIAGNOSIS — I5181 Takotsubo syndrome: Secondary | ICD-10-CM

## 2021-07-03 DIAGNOSIS — Z953 Presence of xenogenic heart valve: Secondary | ICD-10-CM

## 2021-07-03 DIAGNOSIS — Z952 Presence of prosthetic heart valve: Secondary | ICD-10-CM

## 2021-07-03 NOTE — Patient Instructions (Signed)
Medication Instructions:   Your physician recommends that you continue on your current medications as directed. Please refer to the Current Medication list given to you today.  *If you need a refill on your cardiac medications before your next appointment, please call your pharmacy*   Testing/Procedures:  Your physician has requested that you have an echocardiogram. Echocardiography is a painless test that uses sound waves to create images of your heart. It provides your doctor with information about the size and shape of your heart and how well your hearts chambers and valves are working. This procedure takes approximately one hour. There are no restrictions for this procedure.  SCHEDULE TO BE DONE IN 5 1/2 MONTHS--NEEDS TO BE DONE PRIOR TO 6 MONTH OFFICE VISIT WITH DR. Johney Frame   Follow-Up: At [facility], you and your health needs are our priority.  As part of our continuing mission to provide you with exceptional heart care, we have created designated Provider Care Teams.  These Care Teams include your primary Cardiologist (physician) and Advanced Practice Providers (APPs -  Physician Assistants and Nurse Practitioners) who all work together to provide you with the care you need, when you need it.  We recommend signing up for the patient portal called "MyChart".  Sign up information is provided on this After Visit Summary.  MyChart is used to connect with patients for Virtual Visits (Telemedicine).  Patients are able to view lab/test results, encounter notes, upcoming appointments, etc.  Non-urgent messages can be sent to your provider as well.   To learn more about what you can do with MyChart, go to [email or web address].    Your next appointment:   6 month(s)  The format for your next appointment:   In Person  Provider:   Freada Bergeron, MD WITH AN ECHO PRIOR TO THIS VISIT

## 2021-07-16 ENCOUNTER — Encounter: Payer: Self-pay | Admitting: Internal Medicine

## 2021-07-18 ENCOUNTER — Other Ambulatory Visit: Payer: Self-pay | Admitting: Physician Assistant

## 2021-07-18 DIAGNOSIS — E039 Hypothyroidism, unspecified: Secondary | ICD-10-CM

## 2021-07-18 NOTE — Telephone Encounter (Signed)
Please advise 

## 2021-07-19 NOTE — Telephone Encounter (Signed)
I recommend that she take Imodium on a scheduled basis ?-Either daily 1 or 2 times ?-Or Monday Wednesday Friday ?-Or Tuesday Thursday Saturday ?-She has experiment like this and see what works ?-Definitely avoid sugar and caffeine to the extent possible ?

## 2021-07-23 ENCOUNTER — Other Ambulatory Visit: Payer: Self-pay | Admitting: Cardiology

## 2021-07-23 DIAGNOSIS — I340 Nonrheumatic mitral (valve) insufficiency: Secondary | ICD-10-CM

## 2021-07-26 NOTE — Telephone Encounter (Signed)
Received a verbal confirmation from  [place] regarding an approval for OFEV patient assistance from 06/15/21 to 05/19/22.  Requested fax of approval letter ? ?Phone number: [phone] ? ?Knox Saliva, PharmD, MPH, BCPS ?Clinical Pharmacist (Rheumatology and Pulmonology) ? ?

## 2021-08-10 ENCOUNTER — Telehealth: Payer: Self-pay

## 2021-08-10 NOTE — Telephone Encounter (Signed)
Patient requested to schedule a virtual appointment.  Patient states "she doesn't feel there is any need to physically come into the office."  Please advise. ?

## 2021-08-10 NOTE — Telephone Encounter (Signed)
If patient is being scheduled to be seen at [facility] rheumatology then she does not need to schedule an appointment with [place].  She can follow-up with Dr. Chase Caller along with [facility] rheumatology.

## 2021-08-10 NOTE — Telephone Encounter (Signed)
Patient advised if she is being scheduled to be seen at [facility] rheumatology then she does not need to schedule an appointment with [place].  She can follow-up with Dr. Chase Caller along with [facility] rheumatology. Patient expressed understanding.  ?

## 2021-08-22 ENCOUNTER — Encounter: Payer: Self-pay | Admitting: Internal Medicine

## 2021-08-22 NOTE — Telephone Encounter (Signed)
Mychart message sent by pt: ?[facility] (supporting Brand Males, MD) 33 minutes ago (3:17 PM)  ? ?I am continuing to experience diarrhea with the Ofev.  As you recommended I take Immodium at the onset, which keeps me fine for about 3 days.  Then it starts back.   Am I going to be adding Immodium to my pill regimen every week?  Can't seem to shake off the diarrhea for more than maybe four or five days at best.   ? ? ? ? ?MR, please advise. ?

## 2021-08-24 NOTE — Telephone Encounter (Signed)
Ok seen in Sept and then video visit in Nov and then suppsoed to have 2 followups by  now - nothing scheduled v no show. In between calls for diarrhea. Very hard to manage a drug and side effects without followup at all for this long ? ?Plan ? - schedule followup with APP next week with LFT checkj ? - schedule 4 weeks  spiro/dlco and see me 30 min ?- ofev options - either  ? ?A) stop for 1 week and then start at 1 pill once dialy for one week -> then go to 2 pills ?B) or just stay on scheduled immodium - either 3 x/week or even daily as per diarrha ? ?

## 2021-08-24 NOTE — Telephone Encounter (Signed)
Please advise on patient message about Ofev ?

## 2021-09-03 ENCOUNTER — Ambulatory Visit: Payer: Medicare Other | Admitting: Rheumatology

## 2021-09-07 ENCOUNTER — Other Ambulatory Visit: Payer: Self-pay | Admitting: Physician Assistant

## 2021-09-07 DIAGNOSIS — E039 Hypothyroidism, unspecified: Secondary | ICD-10-CM

## 2021-09-10 ENCOUNTER — Other Ambulatory Visit: Payer: Self-pay | Admitting: *Deleted

## 2021-09-10 DIAGNOSIS — E785 Hyperlipidemia, unspecified: Secondary | ICD-10-CM

## 2021-09-10 MED ORDER — ROSUVASTATIN CALCIUM 20 MG PO TABS: 20.0000 mg | ORAL_TABLET | Freq: Every evening | ORAL | 3 refills | Status: DC

## 2021-09-22 ENCOUNTER — Other Ambulatory Visit: Payer: Self-pay | Admitting: Internal Medicine

## 2021-09-29 ENCOUNTER — Other Ambulatory Visit: Payer: Self-pay | Admitting: Physician Assistant

## 2021-10-02 ENCOUNTER — Telehealth: Payer: Self-pay | Admitting: Internal Medicine

## 2021-10-02 DIAGNOSIS — J849 Interstitial pulmonary disease, unspecified: Secondary | ICD-10-CM

## 2021-10-03 NOTE — Telephone Encounter (Signed)
I called the patient and she reports that you had ordered the OFEV the last time and she was told she had been approved through the end of the year. Please advise on where the OFEV is sent?  ?

## 2021-10-03 NOTE — Telephone Encounter (Signed)
I spoke with patient regarding Ofev refill. She states she is now taking Ofev 150mg  once daily. She states she has re-challenged at 150mg  twice daily multiple days but does not want to be taking anti-diarrheal medicationevery day. ? ?I did review that there is a lower-dose of Ofev available at 100mg  and she'd ideally take that twice daily. Reviewed that this may be worth a trial for her. ? ?She completes the supply she has at home in about 4-5 days. I advised that we can wait until her f/u when labwork is repeated and she discusses dosing of Ofev further. She verbalized understanding. ? ?Will plan to send Ofev refill pending joint decision at [place] on 10/16/21 ? ?Knox Saliva, PharmD, MPH, BCPS, CPP ?Clinical Pharmacist (Rheumatology and Pulmonology) ?

## 2021-10-16 ENCOUNTER — Ambulatory Visit (INDEPENDENT_AMBULATORY_CARE_PROVIDER_SITE_OTHER): Payer: Medicare Other | Admitting: Internal Medicine

## 2021-10-16 ENCOUNTER — Encounter: Payer: Self-pay | Admitting: Internal Medicine

## 2021-10-16 VITALS — BP 122/74 | HR 79 | Temp 98.2°F | Ht 66.5 in | Wt 138.0 lb

## 2021-10-16 DIAGNOSIS — R768 Other specified abnormal immunological findings in serum: Secondary | ICD-10-CM | POA: Diagnosis not present

## 2021-10-16 DIAGNOSIS — K449 Diaphragmatic hernia without obstruction or gangrene: Secondary | ICD-10-CM | POA: Diagnosis not present

## 2021-10-16 DIAGNOSIS — Z87891 Personal history of nicotine dependence: Secondary | ICD-10-CM | POA: Diagnosis not present

## 2021-10-16 DIAGNOSIS — Z7189 Other specified counseling: Secondary | ICD-10-CM | POA: Diagnosis not present

## 2021-10-16 DIAGNOSIS — J849 Interstitial pulmonary disease, unspecified: Secondary | ICD-10-CM

## 2021-10-16 DIAGNOSIS — R0609 Other forms of dyspnea: Secondary | ICD-10-CM | POA: Diagnosis not present

## 2021-10-16 DIAGNOSIS — Z85118 Personal history of other malignant neoplasm of bronchus and lung: Secondary | ICD-10-CM

## 2021-10-16 DIAGNOSIS — Z8679 Personal history of other diseases of the circulatory system: Secondary | ICD-10-CM

## 2021-10-16 DIAGNOSIS — K521 Toxic gastroenteritis and colitis: Secondary | ICD-10-CM | POA: Diagnosis not present

## 2021-10-16 DIAGNOSIS — R7689 Other specified abnormal immunological findings in serum: Secondary | ICD-10-CM

## 2021-10-16 LAB — HEPATIC FUNCTION PANEL
ALT: 20 U/L (ref 0–35)
AST: 28 U/L (ref 0–37)
Albumin: 4.2 g/dL (ref 3.5–5.2)
Alkaline Phosphatase: 52 U/L (ref 39–117)
Bilirubin, Direct: 0.1 mg/dL (ref 0.0–0.3)
Total Bilirubin: 0.4 mg/dL (ref 0.2–1.2)
Total Protein: 6.6 g/dL (ref 6.0–8.3)

## 2021-10-16 LAB — CBC WITH DIFFERENTIAL/PLATELET
Basophils Absolute: 0 10*3/uL (ref 0.0–0.1)
Basophils Relative: 0.3 % (ref 0.0–3.0)
Eosinophils Absolute: 0 10*3/uL (ref 0.0–0.7)
Eosinophils Relative: 0.6 % (ref 0.0–5.0)
HCT: 37.3 % (ref 36.0–46.0)
Hemoglobin: 12.3 g/dL (ref 12.0–15.0)
Lymphocytes Relative: 15.6 % (ref 12.0–46.0)
Lymphs Abs: 1.2 10*3/uL (ref 0.7–4.0)
MCHC: 33 g/dL (ref 30.0–36.0)
MCV: 89.6 fl (ref 78.0–100.0)
Monocytes Absolute: 0.5 10*3/uL (ref 0.1–1.0)
Monocytes Relative: 6.6 % (ref 3.0–12.0)
Neutro Abs: 6 10*3/uL (ref 1.4–7.7)
Neutrophils Relative %: 76.9 % (ref 43.0–77.0)
Platelets: 222 10*3/uL (ref 150.0–400.0)
RBC: 4.16 Mil/uL (ref 3.87–5.11)
RDW: 13 % (ref 11.5–15.5)
WBC: 7.7 10*3/uL (ref 4.0–10.5)

## 2021-10-16 LAB — BASIC METABOLIC PANEL
BUN: 13 mg/dL (ref 6–23)
CO2: 30 mEq/L (ref 19–32)
Calcium: 9.6 mg/dL (ref 8.4–10.5)
Chloride: 96 mEq/L (ref 96–112)
Creatinine, Ser: 0.64 mg/dL (ref 0.40–1.20)
GFR: 89.38 mL/min (ref >60.00)
Glucose, Bld: 84 mg/dL (ref 70–99)
Potassium: 4.4 mEq/L (ref 3.5–5.1)
Sodium: 132 mEq/L — ABNORMAL LOW (ref 135–145)

## 2021-10-16 MED ORDER — OFEV 100 MG PO CAPS: 100.0000 mg | ORAL_CAPSULE | Freq: Two times a day (BID) | ORAL | 5 refills | Status: DC

## 2021-10-16 NOTE — Telephone Encounter (Signed)
LFTs drawn today resulted and are wnl.  Rx for Ofev 100mg  twice daily sent to [place] patient to advise her to call [facility] to schedule shipment. Unable to reach - left VM with details.  Knox Saliva, PharmD, MPH, BCPS, CPP Clinical Pharmacist (Rheumatology and Pulmonology)

## 2021-10-16 NOTE — Progress Notes (Signed)
Subjective:    Patient ID: Joy Williams, female    DOB: 1950/07/13    MRN: [id number]    Brief patient profile:  71  yowf  Quit smoking 02/2019   dx with CREST 2004 in [place] moved to [place] from [place] Nov 2015  self referred to pulmonary clinic 10/03/14  for copd though last set of pfts from 2014 did not show copd    History of Present Illness  10/03/2014 1st [facility] office visit/ W[facility]  On spiriva chronically  Chief Complaint  Patient presents with   Pulmonary Consult    Self referral. Pt moved from [place] to [facility] in Nov 2015. 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  She c/o "smoker's cough"- proc with clear sputum.  Her breathing is overall doing well.    cough is the main problem - can still do 5k racing power walk s stopping ie Not limited by breathing from desired activities  Takes fish oil for heart regurgitation and it "works great"  Cough is daily , def Worse in am's year round, present x > 5 y, worse since in [place]  rec Stop spiriva and start >>> Dulera 100 Take 2 puffs first thing in am and then another 2 puffs about 12 hours later.  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 Surgical resection with wedge resection and mediastinal lymph node sampling 12/2014 Records suggest  pneumonectomy done but clearly this is an error      06/07/2015  Extended f/u ov/Wert re: transition of care  sob s/p L lung resection maint rx dulera 100 2bid  Chief Complaint  Patient presents with   Follow-up    Pt c/o increased DOE since had lung surgery for lung ca in Sept 2016.   doe x  MMRC2 = can't walk a nl pace on a flat grade s sob/ assoc with day > noct cough/ no assoc dysphagia/ has not had any RT  rec For cough > mucinex dm 1200 mg every 12 hours as needed Continue dulera 100 Take 2 puffs first thing in am and then another 2 puffs about 12 hours later.  Work on inhaler technique:  The key is to stop smoking completely before smoking completely stops you!  Try prilosec otc 45m  Take 30-60 min before first meal of the day and Pepcid ac (famotidine) 20 mg one @  bedtime until cough is completely gone for at least a week without the need for cough suppression GERD diet    09/17/2016  f/u ov/Wert re: AB on dulera 100 2bid but insurance no longer covering  Chief Complaint  Patient presents with   Follow-up    Pt states her breathing is doing well. She is requesting refill on Dulera. She states she has AM  cough if she forgets to take the PM dose of Dulera. She does not have an albuterol inhaler.   working toward a 5 k this month s/p  MVR 06/27/16 rec  Work on inhaler technique:  Continue symbicort 80 Take 2 puffs first thing in am and then another 2 puffs about 12 hours  lateer    05/25/2018  f/u ov/Wert re: AB  Was on symb 80 2 bid doing ok  Chief Complaint  Patient presents with   Follow-up    Insurance stopped covering symbicort and so she has been out of med x 4 days. She states her breathing has been "okay".  She does not have a rescue inhaler.   Dyspnea:  Not limited by breathing from desired activities  As long as on symb Cough: not a problem Sleeping: fine bed flat  SABA use: none 02: none  rec Advair 115  Take 2 puffs first thing in am and then another 2 puffs  about 12 hours later.  If you are not satisfied for any reason, please return your drug formulary to pick an alternative   07/09/2018  f/u ov/Wert re:  Copd 0/ AB  Chief Complaint  Patient presents with   Follow-up    discuss inhalers per 2/10 phone note.  advair is not working as well as the [employer].    Dyspnea:  More sob on advair vs symbicort Cough: some worse on advair/ dry hacking  Sleeping: up at least once cough SABA use: never uses  02: none  rec Stop advair and start trelegy one click each am x 2 good drags and use arm and hammer tooth paste and gargle and spit out If not happy with trelegy then call for PA on symbicort The key is to stop smoking completely before smoking completely stops you!     08/05/2019  f/u ov/Wert re: AB /  Quit smoking oct  2020/ planning to  Begin  pulmonary rehab/ last covid 2 weeks prior to [place] Complaint  Patient presents with   Follow-up    F/U after PFT. States her breathing has been stable since last visit. Will be starting pulmonary rehab soon.   Dyspnea:  trelegy has been the best rx yet now = MMRC1 = can walk nl pace, flat grade, can't hurry or go uphills or steps s sob   Cough: not now that quit smoking  Sleeping: better now s cough or sob  SABA use: none  02: none  rec No change rx/ f/u one year   09/12/2020  f/u ov/Wert re:  AB/ Scleroderma assoc UIP - did quit smoking / maint trelelgy  Chief Complaint  Patient presents with   Follow-up    Sob-better, had mini stroke last mth., pnd  Dyspnea:  MMRC1 = can walk nl pace, flat grade, can't hurry or go uphills or steps s sob   Cough: just with pollen Sleeping: no resp problems  SABA use: none  02: none  Covid status:   vax x 3 moderna    No obvious day to day or daytime variability or assoc excess/ purulent sputum or mucus plugs or hemoptysis or cp or chest tightness, subjective wheeze or overt sinus or hb symptoms.   Sleeping  without nocturnal  or early am exacerbation   of respiratory  c/o's or need for noct saba. Also denies any obvious fluctuation of symptoms with weather or environmental changes or other aggravating or alleviating factors except as outlined above  No unusual exposure hx or h/o childhood pna/ asthma or knowledge of premature birth.        OV 10/12/2020  Subjective:  Patient ID: Joy Williams, female , DOB: [birth year] , age 30 y.o. , MRN: [id number] , ADDRESS: [address] [place] [id number] PCP Lorrene Reid, PA-C Patient Care Team: Lorrene Reid, PA-C as PCP - General Freada Bergeron, MD as PCP - Cardiology (Cardiology) Dorothy Spark, MD (Inactive) as Consulting Physician (Cardiology) Tanda Rockers, MD as Consulting Physician (Pulmonary Disease) Hurley Cisco, MD as Consulting Physician (Rheumatology) Lerry Paterson, MD as Referring Physician Lissa Morales, MD as Referring Physician (Internal Medicine) [name], MD as Consulting Physician (Infectious Diseases)  This Provider for this visit: Treatment Team:  Attending Provider: Brand Males, MD    10/12/2020 -   Chief Complaint  Patient presents with   Consult    Pt is being referred to Dr. Chase Caller by Dr. Melvyn Novas.  Pt states that she has an occ cough but denies any other complaints.   Transfer of care from Dr. Christinia Gully to Dr. Chase Caller in the ILD center.  History is gained from talking to the patient and review of the medical records including [place] and visualization of 2016 or 2017 CT scan of the chest.  HPI KALEEA PENNER 71 y.o. -used to live in [place].  She worked in the [employer] for [employer].  In 2016 she retired and came to [place].  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  Her primary care physician thought she might have crest syndrome because she had Raynaud's along with allergic cases and she had edema in her  fingers.  After she moved here apparently she saw a rheumatologist [review of the chart indicates is Dr. Charlestine Night who has since retired] and was told that she might not have scleroderma.  She has been on observation therapy from a rheumatological standpoint.  In fact never seen a rheumatologist.  Then in 2016 she had early stage non-small cell lung cancer for which she underwent wedge resection left upper lobe.  She is followed up with [facility] since then.  She has had annual CT scans of the chest.  Most recent 1 was in May 2021.  She believes and per record review that she is in complete remission and I believe she might be discharged from follow-up there.  She tells me that at baseline she has very mild shortness of breath that is stable as documented below.  She also has amount of cough that is also documented below.  Is not clear that this is necessarily worse.  She is maintained on inhaled corticosteroid and long-acting beta agonist.  She is not sure if this is because of her diagnose of COPD other than the fact that she is on it for respiratory symptoms.  Most recently when she followed up with Dr. Gustavus Bryant review of the records revealed that there was interstitial lung disease findings on the CT scan of the chest in May 2021 at [facility].  In my review of the notes it appears that the pattern might have changed.  Infection was present even in 2016 2017 and currently the pattern might be changed.  She is definitely not feeling it.  Her symptoms are minimal and her walking desaturation test is normal.  Because of the ILD changes she has been referred to the ILD center  CT Chest data 09/23/19 at [facility] (no image avail)  Interface, Rad Results In - 09/23/2019 11:22 AM EDT  Formatting of this note might be different from the original.  CT Chest   Indication:  Non-small cell lung cancer, monitor, C34.92 Malignant neoplasm  of unspecified part of left bronchus or lung (CMS-HCC).   Comparison  Exams:  06/10/2019   Protocol:  Chest CT WO Protocol.  Contiguous 0.625 mm axial images with 5  mm axial, 3 mm coronal, and 3 mm sagittal reconstructions were obtained  from the neck base through the upper abdomen without IV contrast material.  In addition, 3-D maximal intensity projection (MIP) reconstructions were  performed to potentially increase the sensitivity for the detection of  pulmonary nodules.   Findings:   The central airways are patent without endobronchial mass. The patient is  status post left upper lobe wedge resection which is stable in appearance.  Status post left lower lobe wedge resection with a similar appearance of  peripheral consolidation and groundglass lung with traction bronchiectasis  adjacent to the staple line. Redemonstrated findings of lower lobe  predominant pulmonary fibrosis with subpleural reticulation and probable  UIP pattern. Stable sub-6 mm pulmonary nodules and nodular opacities are  similar, index 5 mm pulmonary nodule in the anterior pleura of the right  upper lobe (3-016). New left lower lobe nodule on image 4-300 is favored to  represent endoluminal debris. No effusion or pneumothorax. High density  focus along the right diaphragm may represent pleural calcification.  Scattered calcified and noncalcified mediastinal and hilar lymph nodes,  measuring up to 1.3 cm in the left lower paratracheal region   Status post mitral valve are clear with mild cardiomegaly and coronary  artery calcifications in the LAD. No pericardial effusion.   There is a small hiatal hernia no aggressive osseous lesions.   The thyroid gland is unremarkable. No axillary adenopathy or chest wall  abnormality.   Impression:   Stable versus slightly increased in subpleural consolidation in the left  lower lobe adjacent to the surgical staple line. Given curvilinear  appearance on sagittal imaging, findings are favored to represent confluent  fibrosis over  malignancy. Consider additional follow-up chest CT in 3-6  months.   Additional nodules/nodular opacities are similar. Attention on follow-up.   Electronically Reviewed by:  Cheryl Flash, MD, [facility]  Electronically Reviewed on:  09/23/2019 11:14 AM    No results found.   Dec 30 2014 removed LUL lung tissue with 2 tumors/ and L vc paralyzed ever since - last chemo 05/06/15   Last [facility] onc note:  Date of diagnosis: 12/2014 Diagnosis: NSCLC (Synchronous primaries) Histology: Adenocarcinoma Mutation status: EGFR pending, ALK, ROS1 negative Stage: Adenocarcinoma: T1aN0M0 Squamous T2aN0M0 Prior therapy: 1. Surgical resection with wedge resection and mediastinal lymph node s   OV 02/15/2021  Subjective:  Patient ID: Joy Williams, female , DOB: 06-09-1950 , age 37 y.o. , MRN: [id number] , ADDRESS: [address] [place] [id number] PCP Lorrene Reid, PA-C Patient Care Team: Lorrene Reid, PA-C as PCP - General Freada Bergeron, MD as PCP - Cardiology (Cardiology) Tanda Rockers, MD as Consulting Physician (Pulmonary Disease) Hurley Cisco, MD as Consulting Physician (Rheumatology) Lerry Paterson, MD as Referring Physician Lissa Morales, MD as Referring Physician (Internal Medicine) [name], MD as Consulting Physician (Infectious Diseases) [name], Rounding, MD as Rounding Team (Cardiology)  This Provider for this visit: Treatment Team:  Attending Provider: Brand Males, MD    02/15/2021 -  Chief Complaint  Patient presents with   Follow-up    PFT performed today. Pt states she has been doing okay since last visit and states she is about the same.   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 -presents for follow-up.  Last seen in May 2022.  At the time initiated ILD work-up.  Her ANA is strongly positive.  She has history of Raynaud's.  So we sent her to rheumatology.  She saw Dr Estanislado Pandy only recently.  First note reviewed.  No specific connective tissue disease identified. AVISE was sent out and they texted me today saying the results were negative.  Patient continues to overall feel the same.  She had pulmonary function test that shows decline over time.  Approximately 6 %/year over the last 18 months.  This is when FVC and DLCO are averaged.  She is definitely not feeling the difference.  Of note she had a Takotsubo cardiomyopathy in June 2022.  By July 2022 her echo normalized.  She will consult cardiac rehabilitation soon.  She continues to smoke but has reduced.  She nondistended importance of quitting smoking.  She will have a high-dose flu shot today.  We discussed COVID booster by Valent.       IMPRESSION: 1. There is mild pulmonary fibrosis in a pattern with apical to basal gradient, featuring irregular peripheral interstitial opacity, septal thickening, traction bronchiectasis, and subpleural bronchiolectasis at the lung basis without clear evidence of honeycombing. Fibrotic findings are worsened over time on prior examinations dating back to 11/16/2014. Findings are categorized as probable UIP per consensus guidelines: Diagnosis of Idiopathic Pulmonary Fibrosis: An Official ATS/ERS/JRS/ALAT Clinical Practice Guideline. [place], Iss 5, [phone], Jan 18 2017. 2. Redemonstrated postoperative findings of anterior left upper lobe and dependent left lower lobe wedge resections. No evidence of malignant recurrence in the chest. 3. Coronary artery disease.   Aortic Atherosclerosis (ICD10-I70.0).      Electronically Signed   By: Eddie Candle M.D.   OnResults for MERCY, LEPPLA (MRN [id number]) as of 10/20/2020 16:40  Ref. Range 10/12/2020 16:37  Anti Nuclear Antibody (ANA) Latest Ref Range: NEGATIVE  POSITIVE (A)  ANA Pattern 1 Unknown Nuclear, Nucleolar (A)  ANA Titer 1 Latest Units: titer 8:2,641 (H)  Cyclic Citrullin Peptide Ab Latest Units: UNITS <16  ds DNA Ab Latest Units: IU/mL <1  Myeloperoxidase Abs Latest Units: AI <1.0  Serine Protease 3 Latest Units: AI <1.0  RA Latex Turbid. Latest Ref Range: <14 IU/mL <14  SSA (Ro) (ENA) Antibody, IgG Latest Ref Range: <1.0 NEG AI <1.0 NEG  SSB (La) (ENA) Antibody, IgG Latest Ref Range: <1.0 NEG AI <1.0 NEG  Scleroderma (Scl-70) (ENA) Antibody, IgG Latest Ref Range: <1.0 NEG AI <1.0 NEG      OV 03/26/2021  Subjective:  Patient ID: Joy Williams, female , DOB: 07/12/1950 , age 71 y.o. , MRN: [id number] , ADDRESS: [address] [place] [phone] PCP Lorrene Reid, PA-C Patient Care Team: Lorrene Reid, PA-C as PCP - General Freada Bergeron, MD as PCP - Cardiology (Cardiology) Tanda Rockers, MD as Consulting Physician (  Pulmonary Disease) Hurley Cisco, MD as Consulting Physician (Rheumatology) Lerry Paterson, MD as Referring Physician Lissa Morales, MD as Referring Physician (Internal Medicine) [name], MD as Consulting Physician (Infectious Diseases) [name], Rounding, MD as Rounding Team (Cardiology)  This Provider for this visit: Treatment Team:  Attending Provider: Brand Males, MD 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    HPI Joy VIRTUE89y.o. - strted ofev 03/22/21. No diarrhea. Occ mild headache. Noticing mild change in appetite which she is keeping track of it. Went to rehab today and wanted to do video visit instead of face to face.  We discussed participation in clinical research.  She is very interested.  She will start off with doing the ILD-Pro registry.  I will have the research team send her a consent form.  If there is any interventional trial in the future then we will contact her about it.  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  Results for RELLAMAY, FORS(MRN [id number] as of 03/26/2021 14:11  Ref. Range 11/30/2020 11:10  AST Latest Ref Range: 0 - 40 IU/L 23  ALT Latest Ref Range: 0 - 32 IU/L 12       OV 10/16/2021  Subjective:  Patient ID: Joy Williams female , DOB: 9Jul 02, 1952, age 71y.o. , MRN: [id number], ADDRESS: [address] [id number] ALorrene Reid PA-C Patient Care Team: ALorrene Reid PA-C as PCP - General PFreada Bergeron MD as PCP - Cardiology (Cardiology) WTanda Rockers MD as Consulting Physician (Pulmonary Disease) THurley Cisco MD as Consulting Physician (Rheumatology) DLerry Paterson MD as Referring Physician SLissa Morales MD as Referring Physician (Internal Medicine) SCarlyle Basques MD as Consulting Physician (Infectious Diseases) [name], Rounding, MD as Rounding Team (Cardiology)  This Provider for this visit: Treatment Team:  Attending Provider: RBrand Males MD  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 Stopped may 2023 -> change to low dose 10013mid June 2023 due  to diarrhe  Follow-up ongoing smoking  History of left upper lobe non-small cell lung cancer resection 2016-in remission as of September 2022  - last CT June 2022  Small hiatal hernia on CT scan chest September 2022 monitor for acid reflux symptoms  Baseline telangiectasias on the face.   10/16/2021 -   Chief Complaint  Patient presents with   Follow-up    Patient has been having side effects from the OFEBrooksvilledication which is the reason for today's visit. Pt states she has had to cut her OFEV down to taking 1 pill a day versus the recommended 2 pills daily.     HPI DebTU SHIMMEL 21o. -returns for follow-up.  Clinically feels stable although symptom scores are worse compared to 1 year ago.  The main reason is that she is having severe diarrhea on full dose nintedanib.  She reduced it  to 150 mg once daily and the diarrhea improved.  Now for the last 4 days she is stopped taking nintedanib because she ran out.  She tried several measures to control the diarrhea including taking Imodium all the time but she does not want to do it.  At baseline she does eat spicy food.  She also takes 1 Stevia a day along with 1 cup of coffee but there is no excessive sweetener use or dessert use.  She does not want to change her diet because she feels at baseline she was fine.  Symptom wise she is stable but how she feels but over the course of 1 year his shortness of breath appears to be worse.  Her last CT scan of the chest was in June 2022 Last pulmonary function test was in September 2022.  She has upcoming vacation to the [employer]   She is interested in ILD-Pro registry study but she missed the window because she did not reply to Research officer, political party phone calls.  However she remains interested.  SYMPTOM SCALE - ILD 10/12/2020  10/16/2021 138#  O2 use ra ra  Shortness of Breath 0 -> 5 scale with 5 being worst (score 6 If unable to do)   At rest 0 1  Simple tasks - showers, clothes change,  eating, shaving 0 1  Household (dishes, doing bed, laundry) 1 2  Shopping 2 2  Walking level at own pace 1 1  Walking up Stairs 2 3  Total (30-36) Dyspnea Score 6 10  How bad is your cough? 2.5 3  How bad is your fatigue 2.5 2  How bad is nausea 0 0  How bad is vomiting?  0 0  How bad is diarrhea? 0 0 afer stopping all ofev  How bad is anxiety? 1 1  How bad is depression 0 0       Simple office walk 185 feet x  3 laps goal with forehead probe 10/12/2020   O2 used ra  Number laps completed 3  Comments about pace avg  Resting Pulse Ox/HR 100% and 72/min  Final Pulse Ox/HR 100% and 95/min  Desaturated </= 88% no  Desaturated <= 3% points no  Got Tachycardic >/= 90/min yes  Symptoms at end of test No complaints  Miscellaneous comments x     PFT     Latest Ref Rng & Units 02/15/2021    2:49 PM 08/05/2019   10:55 AM 03/12/2016   10:26 AM 11/14/2014   11:05 AM  PFT Results  FVC-Pre L 2.35   2.52   2.67   2.97    FVC-Predicted Pre % 71   74   76   84    FVC-Post L 2.41   2.52   2.61   3.00    FVC-Predicted Post % 72   74   75   85    Pre FEV1/FVC % % 78   81   78   78    Post FEV1/FCV % % 81   82   80   78    FEV1-Pre L 1.84   2.04   2.09   2.33    FEV1-Predicted Pre % 73   79   78   86    FEV1-Post L 1.96   2.06   2.09   2.33    DLCO uncorrected ml/min/mmHg 12.17   13.77   13.52   14.61    DLCO UNC% % 57  64   48   52    DLCO corrected ml/min/mmHg 12.17   13.77   14.73     DLCO COR %Predicted % 57   64   53     DLVA Predicted % 86   87   84   65    TLC L 3.83   4.22   4.18   4.88    TLC % Predicted % 70   77   77   89    RV % Predicted % 64   74   71   88         has a past medical history of Allergy, Asthma, Carotid artery disease ([place]) (2019), Chronic diastolic congestive heart failure ([place]), 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   reports that she has been smoking cigarettes. She started smoking about 38 years ago. She has a 24.00 pack-year smoking history. She has never used smokeless tobacco.  Past Surgical History:  Procedure Laterality Date   BACK SURGERY     x 3  Disectomy   BREAST BIOPSY Left    CARDIAC CATHETERIZATION N/A 03/08/2016   Procedure: Right/Left Heart Cath and Coronary Angiography;  Surgeon: Sherren Mocha, MD;  Location: [facility];  Service: Cardiovascular;  Laterality: N/A;   CLAVICLE SURGERY Left 2013   plate to left collar bone   ENDARTERECTOMY Right 08/09/2020   Procedure: RIGHT CAROTID ENDARTERECTOMY;  Surgeon: Waynetta Sandy, MD;  Location: [facility];  Service: Vascular;  Laterality: Right;   LAPAROSCOPY     ? reason-age 71    LEFT HEART CATH AND CORONARY ANGIOGRAPHY N/A 02/25/2019   Procedure: LEFT HEART CATH AND CORONARY ANGIOGRAPHY;  Surgeon: Jettie Booze, MD;  Location: [facility];  Service: Cardiovascular;  Laterality: N/A;   LEFT HEART CATH AND CORONARY ANGIOGRAPHY N/A 10/24/2020   Procedure: LEFT HEART CATH AND CORONARY ANGIOGRAPHY;  Surgeon: Jettie Booze, MD;  Location: [facility];  Service: Cardiovascular;  Laterality: N/A;   LUNG CANCER SURGERY     MITRAL VALVE REPAIR Right 06/27/2016   Procedure: MINIMALLY INVASIVE MITRAL VALVE REPLACEMENT;  Surgeon: Rexene Alberts, MD;  Location: [facility];  Service: Open Heart Surgery;  Laterality: Right;   TEE WITHOUT CARDIOVERSION N/A 02/22/2016   Procedure: TRANSESOPHAGEAL  ECHOCARDIOGRAM (TEE);  Surgeon: Dorothy Spark, MD;  Location: [facility];  Service: Cardiovascular;  Laterality: N/A;   TEE WITHOUT CARDIOVERSION N/A 06/27/2016   Procedure: TRANSESOPHAGEAL ECHOCARDIOGRAM (TEE);  Surgeon: Rexene Alberts, MD;  Location: [facility];  Service: Open Heart Surgery;  Laterality: N/A;   TONSILLECTOMY     VIDEO ASSISTED THORACOSCOPY (VATS)/WEDGE RESECTION Left 12/30/2014   Bronchoscopy, Mediastinoscopy, Left VATS for Wedge resection LUL x2 adn LLL x1 - Dr. Elenor Quinones at [facility] Left 2017   injected with botox   wrist surgery Left 2015   plate to wrist     Allergies  Allergen Reactions   Penicillins Other (See Comments)    Unknown, occurred as a child   Bee Venom Swelling    Severe  swelling at the sting site.    Immunization History  Administered Date(s) Administered   Fluad Quad(high Dose 65+) 02/18/2019, 02/15/2021   H1N1 04/13/2008   Influenza Split 02/18/2015   Influenza, High Dose Seasonal PF 02/20/2017, 04/27/2018   Influenza,inj,Quad PF,6+ Mos 03/16/2015   Influenza-Unspecified 04/12/2008, 02/18/2011   Moderna Sars-Covid-2 Vaccination 06/21/2019, 07/18/2019, 01/25/2020   Pneumococcal Conjugate-13 01/22/2017   Tdap 09/26/2015   Zoster Recombinat (Shingrix) 05/10/2018, 03/03/2019    Family History  Problem Relation Age of Onset   Mitral valve prolapse Mother    Dementia Father    Prostate cancer Father    Mitral valve prolapse Sister    Mitral valve prolapse Brother    Healthy Son    Healthy Daughter    Colon cancer Neg Hx    Colon polyps Neg Hx    Rectal cancer Neg Hx    Esophageal cancer Neg Hx    Stomach cancer Neg Hx    Heart attack Neg Hx      Current Outpatient Medications:    acetaminophen (TYLENOL) 500 MG tablet, Take 1,000 mg by mouth every 6 (six) hours as needed for mild pain or headache., Disp: , Rfl:    Cholecalciferol (VITAMIN D) 125 MCG (5000 UT) CAPS, Take 5,000 Units by mouth daily., Disp: , Rfl:     clopidogrel (PLAVIX) 75 MG tablet, Take 1 tablet (75 mg total) by mouth daily., Disp: 90 tablet, Rfl: 3   EPINEPHrine (EPIPEN 2-PAK) 0.3 mg/0.3 mL IJ SOAJ injection, Inject 0.3 mg into the muscle as needed for anaphylaxis., Disp: 1 each, Rfl: 1   ferrous sulfate 325 (65 FE) MG tablet, TAKE 1 TABLET BY MOUTH EVERY DAY, Disp: 90 tablet, Rfl: 2   Fluticasone-Umeclidin-Vilant (TRELEGY ELLIPTA) 100-62.5-25 MCG/ACT AEPB, INHALE 1 PUFF BY MOUTH EVERY DAY, Disp: 60 each, Rfl: 5   levothyroxine (SYNTHROID) 175 MCG tablet, TAKE 1 TABLET BY MOUTH EVERY DAY BEFORE BREAKFAST, Disp: 90 tablet, Rfl: 0   MELATONIN PO, Take 1 tablet by mouth at bedtime as needed (sleep)., Disp: , Rfl:    Multiple Vitamins-Minerals (MULTIVITAMIN WITH MINERALS) tablet, Take 1 tablet by mouth daily., Disp: , Rfl:    Nintedanib (OFEV) 150 MG CAPS, Take 150 mg by mouth 2 (two) times daily., Disp: , Rfl:    rosuvastatin (CRESTOR) 20 MG tablet, Take 1 tablet (20 mg total) by mouth every evening., Disp: 90 tablet, Rfl: 3   Turmeric 500 MG CAPS, Take 500 mg by mouth daily., Disp: , Rfl:    vitamin B-12 (CYANOCOBALAMIN) 1000 MCG tablet, Take 1 tablet (1,000 mcg total) by mouth daily., Disp: , Rfl:       Objective:   Vitals:   10/16/21 0915  BP: 122/74  Pulse: 79  Temp: 98.2 F (36.8 C)  TempSrc: Oral  SpO2: 99%  Weight: 138 lb (62.6 kg)  Height: 5' 6.5" (1.689 m)    Estimated body mass index is 21.94 kg/m as calculated from the following:   Height as of this encounter: 5' 6.5" (1.689 m).   Weight as of this encounter: 138 lb (62.6 kg).  '@WEIGHTCHANGE' @  [employer]   10/16/21 0915  Weight: 138 lb (62.6 kg)     Physical Exam   General: No distress.  Telangiectasias on the face Neuro: Alert and Oriented x 3. GCS 15. Speech normal Psych: Pleasant Resp:  Barrel Chest - no.  Wheeze - no, Crackles - yes, No overt respiratory distress CVS: Normal heart sounds. Murmurs - no Ext: Stigmata of  Connective Tissue Disease -  no HEENT: Normal upper airway. PEERL +. No post nasal drip        Assessment:       ICD-10-CM   1. ILD (interstitial lung disease) ([place])  J84.9     2. History of Raynaud's syndrome  Z86.79     3. ANA positive  R76.8     4. Hiatal hernia  K44.9     5. DOE (dyspnea on exertion)  R06.09     6. History of smoking 25-50 pack years  Z87.891     7. History of lung cancer  Z85.118     8. Encounter for medication counseling  Z71.89     9. Diarrhea due to drug  K52.1          Plan:     Patient Instructions     ICD-10-CM   1. ILD (interstitial lung disease) ([place])  J84.9     2. History of Raynaud's syndrome  Z86.79     3. ANA positive  R76.8     4. Hiatal hernia  K44.9     5. DOE (dyspnea on exertion)  R06.00     6. History of smoking 25-50 pack years  Z87.891     7. 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 RamChase Callerce-to-face in 8-12 weeks for 30-minute encounter  - symptom score and walk test at next visit  She has complex medical condition that requires intensive therapeutic monitoring  SIGNATURE    Dr. MurBrand Males.D., F.C.C.P,  Pulmonary and Critical Care Medicine Staff Physician, [facility] - Interstitial Lung Disease  Program  Pulmonary [id number]: [phone] no answer or between  15:00h - 7:00h: call [phone] Telephone: [phone]  9:58 AM 10/16/2021

## 2021-10-16 NOTE — Telephone Encounter (Signed)
Spoke with patient - provided her with phone number for [facility].  Knox Saliva, PharmD, MPH, BCPS, CPP Clinical Pharmacist (Rheumatology and Pulmonology)

## 2021-10-16 NOTE — Telephone Encounter (Signed)
Pt saw MR for an appt today 5/30 and plan is to have pt switch to OFEV 100mg . Routing to pharmacy team.

## 2021-10-16 NOTE — Patient Instructions (Addendum)
    ICD-10-CM   1. ILD (interstitial lung disease) ([place])  J84.9     2. History of Raynaud's syndrome  Z86.79     3. ANA positive  R76.8     4. Hiatal hernia  K44.9     5. DOE (dyspnea on exertion)  R06.00     6. History of smoking 25-50 pack years  Z87.891     7. 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 Chase Caller face-to-face in 8-12 weeks for 30-minute encounter  - symptom score and walk test at next visit

## 2021-10-16 NOTE — Addendum Note (Signed)
Addended by: Lorretta Harp on: 10/16/2021 10:28 AM   Modules accepted: Orders

## 2021-11-23 ENCOUNTER — Other Ambulatory Visit: Payer: Self-pay | Admitting: Physician Assistant

## 2021-11-23 DIAGNOSIS — E039 Hypothyroidism, unspecified: Secondary | ICD-10-CM

## 2021-12-04 ENCOUNTER — Ambulatory Visit (HOSPITAL_COMMUNITY): Payer: Medicare Other | Attending: Cardiology

## 2021-12-04 DIAGNOSIS — R0609 Other forms of dyspnea: Secondary | ICD-10-CM | POA: Diagnosis not present

## 2021-12-04 DIAGNOSIS — Z953 Presence of xenogenic heart valve: Secondary | ICD-10-CM | POA: Insufficient documentation

## 2021-12-04 LAB — ECHOCARDIOGRAM COMPLETE
Area-P 1/2: 3.27 cm2
MV VTI: 1.82 cm2
S' Lateral: 2.6 cm

## 2021-12-05 ENCOUNTER — Other Ambulatory Visit: Payer: Self-pay

## 2021-12-05 MED ORDER — CLOPIDOGREL BISULFATE 75 MG PO TABS: 75.0000 mg | ORAL_TABLET | Freq: Every day | ORAL | 2 refills | Status: DC

## 2022-01-07 ENCOUNTER — Telehealth: Payer: Self-pay | Admitting: Internal Medicine

## 2022-01-07 NOTE — Telephone Encounter (Signed)
I met   Joy Williams today through [facility]     SIGNATURE    Dr. Brand Males, M.D., F.C.C.P,  Pulmonary and Critical Care Medicine Staff Physician, [facility] Director - Interstitial Lung Disease  Program  Medical Director - [place] [facility] [place] at [place], [place], [address]  NPI Number:  NPI #[id number] [employer] Number: [id number]  Pager: [phone], If no answer  -[place] or [facility] [place] Telephone (clinical office): [phone] Telephone (research): [phone]  10:41 AM 01/07/2022

## 2022-01-09 ENCOUNTER — Encounter: Payer: Self-pay | Admitting: Internal Medicine

## 2022-01-09 DIAGNOSIS — J849 Interstitial pulmonary disease, unspecified: Secondary | ICD-10-CM

## 2022-01-10 MED ORDER — OFEV 100 MG PO CAPS: 100.0000 mg | ORAL_CAPSULE | Freq: Two times a day (BID) | ORAL | 0 refills | Status: DC

## 2022-01-10 NOTE — Telephone Encounter (Signed)
Refill sent for OFEV to [facility] for [facility]: [phone] (for one month only as patient has f/u with Dr. Chase Caller on 01/24/22 and will be due for LFT recheck  Dose: 100 mg twice daily  Last OV: 10/16/21 Provider: Dr. Chase Caller  Next OV: 01/24/22  Knox Saliva, PharmD, MPH, BCPS Clinical Pharmacist (Rheumatology and Pulmonology)

## 2022-01-18 ENCOUNTER — Ambulatory Visit (HOSPITAL_BASED_OUTPATIENT_CLINIC_OR_DEPARTMENT_OTHER)
Admission: RE | Admit: 2022-01-18 | Discharge: 2022-01-18 | Disposition: A | Discharge status: Home / Self Care | Payer: Medicare Other | Source: Ambulatory Visit | Attending: Physician Assistant | Admitting: Physician Assistant

## 2022-01-18 DIAGNOSIS — J849 Interstitial pulmonary disease, unspecified: Secondary | ICD-10-CM | POA: Diagnosis not present

## 2022-01-18 DIAGNOSIS — R918 Other nonspecific abnormal finding of lung field: Secondary | ICD-10-CM | POA: Diagnosis not present

## 2022-01-18 DIAGNOSIS — J479 Bronchiectasis, uncomplicated: Secondary | ICD-10-CM | POA: Diagnosis not present

## 2022-01-18 DIAGNOSIS — Z85118 Personal history of other malignant neoplasm of bronchus and lung: Secondary | ICD-10-CM

## 2022-01-24 ENCOUNTER — Ambulatory Visit: Payer: Medicare Other | Admitting: Internal Medicine

## 2022-01-24 ENCOUNTER — Encounter: Payer: Self-pay | Admitting: Pharmacist

## 2022-01-24 DIAGNOSIS — J849 Interstitial pulmonary disease, unspecified: Secondary | ICD-10-CM

## 2022-01-24 DIAGNOSIS — Z7189 Other specified counseling: Secondary | ICD-10-CM

## 2022-02-04 ENCOUNTER — Other Ambulatory Visit (HOSPITAL_COMMUNITY): Payer: Medicare Other

## 2022-02-06 ENCOUNTER — Other Ambulatory Visit (INDEPENDENT_AMBULATORY_CARE_PROVIDER_SITE_OTHER): Payer: Medicare Other

## 2022-02-06 DIAGNOSIS — Z7189 Other specified counseling: Secondary | ICD-10-CM

## 2022-02-06 DIAGNOSIS — J849 Interstitial pulmonary disease, unspecified: Secondary | ICD-10-CM

## 2022-02-06 LAB — COMPREHENSIVE METABOLIC PANEL
ALT: 25 U/L (ref 0–35)
AST: 29 U/L (ref 0–37)
Albumin: 4.1 g/dL (ref 3.5–5.2)
Alkaline Phosphatase: 52 U/L (ref 39–117)
BUN: 12 mg/dL (ref 6–23)
CO2: 29 mEq/L (ref 19–32)
Calcium: 9.7 mg/dL (ref 8.4–10.5)
Chloride: 93 mEq/L — ABNORMAL LOW (ref 96–112)
Creatinine, Ser: 0.64 mg/dL (ref 0.40–1.20)
GFR: 89.18 mL/min (ref >60.00)
Glucose, Bld: 75 mg/dL (ref 70–99)
Potassium: 3.9 mEq/L (ref 3.5–5.1)
Sodium: 129 mEq/L — ABNORMAL LOW (ref 135–145)
Total Bilirubin: 0.5 mg/dL (ref 0.2–1.2)
Total Protein: 7.1 g/dL (ref 6.0–8.3)

## 2022-02-08 MED ORDER — OFEV 100 MG PO CAPS: 100.0000 mg | ORAL_CAPSULE | Freq: Two times a day (BID) | ORAL | 5 refills | Status: DC

## 2022-02-10 NOTE — Progress Notes (Deleted)
Cardiology Office Note:    Date:  02/10/2022   ID:  MADESYN AST, DOB 1951-01-11, MRN [id number]  PCP:  Lorrene Reid, PA-C   [facility] Providers Cardiologist:  Freada Bergeron, MD {   Referring MD: Lorrene Reid, PA-C    History of Present Illness:    Joy Williams is a 71 y.o. female with a hx of MVP s/p minimally invasive bioprosthetic MVR, carotid artery disease s/p R CEA, scleroderma, cold agglutinin disease, raynaud's disease, UIP, NSCLC, COPD, hypothyroidism and recent admission for STEMI found to have clean coronaries on cath with diagnosis of Takostubo CM discharged on 10/29/20 and recent readmission for dizziness on 11/03/20 who now presents to clinic for follow-up.  Patient was admitted 02/24/19-02/25/19 with chest discomfort.  High-sensitivity troponin levels were elevated (1471 >> 1625 >> 1246 >> 1150).  There was concern for NSTEMI., however, cath demonstrated no CAD.  TTE demonstrated normal LV function and normally functioning mitral valve prosthesis.  She was followed by Dr. Debara Pickett in the hospital and he felt that her presentation was c/w [facility] (myocardial infarction with non-obstructive coronary arteries) or non-ACS troponin elevation.  The possible causes included myocarditis, coronary spasm, cardioembolic event. Underwent cardiac MRI that showed focal subendocardial LGE into mid inferolateral wall consistent with infarct seen on territory with concern for a thromboembolic event given clean cath. She was managed medically and worked with cardiac rehab at that time.   In November of 2020, she developed palpitations. 4 week zio placed with brief runs of SVT but no significant arrhythmias or Afib   Re-admitted to [facility] from 08/06/20-08/10/20 where she presented for acute onset dizziness, left arm incoordination and HA. CT head negative for acute abnormalities. MRI brain without acute infarct but showed multiple scattered remote lacunar infarcts. 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 TTE with EF 65 to 70%, NWMA, mild LVH, moderate left atrial size dilated, mild MVR, no source of emboli noted. She underwent right CEA on 08/09/20 with [name]. She was continued on ASA, plavix and statin.  Re-admitted again from 10/24/20-10/29/20 where she presented with chest pain and nausea found to have STE in I and aVL for which a STEMI was called. She was taken to the cath lab which showed 25% pLAD stenosis, otherwise normal coronary arteries, and EF 25-35% with LV gram c/w takotsubo cardiomyopathy. She was started on plavix. TTE showed EF 25-30%, global hypokinesis, G1DD, moderately elevated PA pressures, moderate LAE, and normal structure/function of mitral valve s/p repair/replacement. GDMT was limited due to hypotension and she was discharged on 10/29/20.  Re-presented to the ER on 11/03/20 with dizziness and orthostatic symptoms. Trop 41-->44 which was down from prior admission. Symptoms thought to be due to orthostasis and farxiga discontinued.  Saw [facility] on 11/06/20 where she was doing better. Dizziness improved. Bedside echo with improved LVEF.  She was seen 11/15/2020 where she was feeling much improved since last admission.  Repeat echo 11/22/2020 with LVEF returned to normal 60 to 65%, mild LVH, trivial central MR with no stenosis.  Last seen in clinic on 07/03/21 where she felt well from a CV standpoint. TTE 12/04/21 with LVEF 60-65%, normal RV, moderate LAE, normal functioning MV prothesis with mean gradient 2.67mmHg, trivial MR.  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   MVP (mitral valve prolapse) 2018   s/p Minimally-Invasive Mitral Valve Replacement w/  [facility] Mitral bovine bioprosthetic tissue valve (size 55mm, model # 7300TFX, serial # [id number])   PONV (postoperative nausea and vomiting)    Raynaud's syndrome    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   31 mm [facility] mitral bovine bioprosthetic tissue valve placed via right mini thoracotomy approach   Severe mitral regurgitation 11/15/2014   Shortness of breath dyspnea    with exertion   STD (sexually transmitted disease)    STEMI (ST elevation myocardial infarction) ([place]) 10/24/2020   Telangiectasia     Past Surgical History:  Procedure Laterality Date   BACK SURGERY     x 3  Disectomy   BREAST BIOPSY Left    CARDIAC CATHETERIZATION N/A 03/08/2016   Procedure: Right/Left Heart Cath and Coronary Angiography;  Surgeon: Sherren Mocha, MD;  Location: [place] [facility];  Service: Cardiovascular;  Laterality: N/A;   CLAVICLE SURGERY Left 2013   plate to left collar bone   ENDARTERECTOMY Right 08/09/2020   Procedure: RIGHT CAROTID ENDARTERECTOMY;  Surgeon: Waynetta Sandy, MD;  Location: [place];  Service: Vascular;  Laterality: Right;   LAPAROSCOPY     ? reason-age 59    LEFT HEART CATH AND CORONARY ANGIOGRAPHY N/A 02/25/2019   Procedure: LEFT HEART CATH AND CORONARY ANGIOGRAPHY;  Surgeon: Jettie Booze, MD;  Location: [place] [facility];  Service:  Cardiovascular;  Laterality: N/A;   LEFT HEART CATH AND CORONARY ANGIOGRAPHY N/A 10/24/2020   Procedure: LEFT HEART CATH AND CORONARY ANGIOGRAPHY;  Surgeon: Jettie Booze, MD;  Location: [place] [facility];  Service: Cardiovascular;  Laterality: N/A;   LUNG CANCER SURGERY     MITRAL VALVE REPAIR Right 06/27/2016   Procedure: MINIMALLY INVASIVE MITRAL VALVE REPLACEMENT;  Surgeon: Rexene Alberts, MD;  Location: [place];  Service: Open Heart Surgery;  Laterality: Right;   TEE WITHOUT CARDIOVERSION N/A 02/22/2016   Procedure: TRANSESOPHAGEAL ECHOCARDIOGRAM (TEE);  Surgeon: Dorothy Spark, MD;  Location: [place];  Service: Cardiovascular;  Laterality: N/A;   TEE WITHOUT CARDIOVERSION N/A 06/27/2016   Procedure: TRANSESOPHAGEAL ECHOCARDIOGRAM (TEE);  Surgeon: Rexene Alberts, MD;  Location: [place];  Service: Open Heart Surgery;  Laterality: N/A;   TONSILLECTOMY     VIDEO ASSISTED THORACOSCOPY (VATS)/WEDGE RESECTION Left 12/30/2014   Bronchoscopy, Mediastinoscopy, Left VATS for Wedge resection LUL x2 adn LLL x1 - Dr. Elenor Quinones at [place] Left 2017   injected with botox   wrist surgery Left 2015   plate to wrist     Current Medications: No outpatient medications have been  marked as taking for the 02/13/22 encounter (Appointment) with Freada Bergeron, MD.     Allergies:   Penicillins and Bee venom   Social History   Socioeconomic History   Marital status: Divorced    Spouse name: Not on file   Number of children: Not on file   Years of education: 16   Highest education level: Not on file  Occupational History   Occupation: Retired  Tobacco Use   Smoking status: Some Days    Packs/day: 1.00    Years: 24.00    Total pack years: 24.00    Types: Cigarettes    Start date: 05/21/1983   Smokeless tobacco: Never   Tobacco comments:    Currently smoking 5 cigs a day as of 10/16/21 ep  Vaping Use   Vaping Use: Never used  Substance and Sexual Activity   Alcohol  use: Yes    Alcohol/week: 2.0 standard drinks of alcohol    Types: 1 Shots of liquor, 1 Standard drinks or equivalent per week    Comment: occ   Drug use: No   Sexual activity: Not Currently    Partners: Male    Birth control/protection: Post-menopausal  Other Topics Concern   Not on file  Social History Narrative   Not on file   Social Determinants of Health   Financial Resource Strain: Low Risk  (10/26/2020)   Overall Financial Resource Strain (CARDIA)    Difficulty of Paying Living Expenses: Not very hard  Food Insecurity: No Food Insecurity (10/26/2020)   Hunger Vital Sign    Worried About Running Out of Food in the Last Year: Never true    Ran Out of Food in the Last Year: Never true  Transportation Needs: Unmet Transportation Needs (10/26/2020)   PRAPARE - Hydrologist (Medical): No    Lack of Transportation (Non-Medical): Yes  Physical Activity: Not on file  Stress: Not on file  Social Connections: Not on file     Family History: The patient's family history includes Dementia in her father; Healthy in her daughter and son; Mitral valve prolapse in her brother, mother, and sister; Prostate cancer in her father. There is no history of Colon cancer, Colon polyps, Rectal cancer, Esophageal cancer, Stomach cancer, or Heart attack.  ROS:   Please see the history of present illness.    Review of Systems  Constitutional:  Negative for chills and fever.  HENT:  Negative for hearing loss.   Eyes:  Negative for blurred vision.  Respiratory:  Negative for shortness of breath.   Cardiovascular:  Negative for chest pain, palpitations, orthopnea, claudication, leg swelling and PND.  Gastrointestinal:  Negative for blood in stool, nausea and vomiting.  Genitourinary:  Negative for dysuria and hematuria.  Musculoskeletal:  Negative for falls.  Neurological:  Negative for dizziness and loss of consciousness.  Endo/Heme/Allergies:  Bruises/bleeds easily.   Psychiatric/Behavioral:  Negative for substance abuse.      EKGs/Labs/Other Studies Reviewed:    The following studies were reviewed today: TTE 2021-12-21: IMPRESSIONS     1. Left ventricular ejection fraction, by estimation, is 60 to 65%. Left  ventricular ejection fraction by 3D volume is 61%. The left ventricle has  normal function. The left ventricle has no regional wall motion  abnormalities. Diastolic function  indeterminant due to MVR. The average left ventricular global longitudinal  strain is -17.1 %. The global longitudinal strain is abnormal and mildly  reduced.   2. Right ventricular systolic  function is normal. The right ventricular  size is normal.   3. Left atrial size was moderately dilated.   4. The mitral valve has been repaired/replaced. There is a 31 mm Edwards  Magna bovine bioprosthetic tissue valve present in the mitral position.  Echo findings are consistent with normal structure and function of the  mitral valve prosthesis. Mean  gradient 2.49mmHg at HR 63bpm. Trivial central mitral regurgitation.   5. The aortic valve is tricuspid. There is mild calcification of the  aortic valve. There is mild thickening of the aortic valve. Aortic valve  regurgitation is not visualized. Aortic valve sclerosis/calcification is  present, without any evidence of  aortic stenosis.   6. The inferior vena cava is normal in size with greater than 50%  respiratory variability, suggesting right atrial pressure of 3 mmHg.   Comparison(s): No significant change from prior study.  Echo 11/22/20  1. Left ventricular ejection fraction, by estimation, is 60 to 65%. Left  ventricular ejection fraction by 3D volume is 62 %. The left ventricle has  normal function. There is mild concentric left ventricular hypertrophy.  Left ventricular diastolic  function could not be evaluated.   2. Right ventricular systolic function is normal. The right ventricular  size is normal. There is normal  pulmonary artery systolic pressure. The  estimated right ventricular systolic pressure is 86.7 mmHg.   3. Left atrial size was mild to moderately dilated.   4. 31 mm Magna Ease in the mitral position 06/27/2016. MG 2 mmHG @ 65 bpm.  EOA 2.27 cm2. Trivial central MR. No stenosis. Normal prosthesis. Trivial  mitral valve regurgitation. The mean mitral valve gradient is 2.0 mmHg  with average heart rate of 65 bpm.  There is a 31 mm bioprosthetic valve present in the mitral position.  Procedure Date: 06/27/16.   5. The aortic valve is tricuspid. Aortic valve regurgitation is not  visualized. No aortic stenosis is present.   6. The inferior vena cava is normal in size with greater than 50%  respiratory variability, suggesting right atrial pressure of 3 mmHg.   Comparison(s): Changes from prior study are noted. EF is now 60-65%. WMA  have resolved.  LHC 10/24/20: Prox LAD lesion is 25% stenosed. No significant CAD. The left ventricular systolic function is normal. LV end diastolic pressure is mildly elevated. The left ventricular ejection fraction is 25-35% by visual estimate. There is no aortic valve stenosis. Prior mitral valve surgery.   Despite lateral ST elevation, we only found nonobstructive coronary artery disease.  Left ventriculogram fits with a pattern of Takotsubo cardiomyopathy.  LV function significantly different from her prior catheterization.  She will need aggressive medical therapy for LV dysfunction.   I discussed the findings with her daughter.    10/18/2020: IMPRESSION: 1. There is mild pulmonary fibrosis in a pattern with apical to basal gradient, featuring irregular peripheral interstitial opacity, septal thickening, traction bronchiectasis, and subpleural bronchiolectasis at the lung basis without clear evidence of honeycombing. Fibrotic findings are worsened over time on prior examinations dating back to 11/16/2014. Findings are categorized as probable UIP per  consensus guidelines: Diagnosis of Idiopathic Pulmonary Fibrosis: An Official ATS/ERS/JRS/ALAT Clinical Practice Guideline. [place], Iss 5, [phone], Jan 18 2017. 2. Redemonstrated postoperative findings of anterior left upper lobe and dependent left lower lobe wedge resections. No evidence of malignant recurrence in the chest. 3. Coronary artery disease.   Aortic Atherosclerosis (ICD10-I70.0).   Echocardiogram 10/25/20: 1. Left ventricular  ejection fraction, by estimation, is 25 to 30%. The  left ventricle has severely decreased function. The left ventricle  demonstrates global hypokinesis. Left ventricular diastolic parameters are  consistent with Grade I diastolic  dysfunction (impaired relaxation).   2. Right ventricular systolic function is normal. The right ventricular  size is normal. There is moderately elevated pulmonary artery systolic  pressure. The estimated right ventricular systolic pressure is 06.2 mmHg.   3. Left atrial size was moderately dilated.   4. The mitral valve has been repaired/replaced. No evidence of mitral  valve regurgitation. No evidence of mitral stenosis. Severe mitral annular  calcification. Echo findings are consistent with normal structure and  function of the mitral valve  prosthesis.   5. The aortic valve is normal in structure. Aortic valve regurgitation is  not visualized. No aortic stenosis is present.   6. The inferior vena cava is normal in size with greater than 50%  respiratory variability, suggesting right atrial pressure of 3 mmHg.   Comparison(s): Prior images unable to be directly viewed, comparison made  by report only. The left ventricular function is worsened.  EKG:  No new tracing.  Recent Labs: 10/16/2021: Hemoglobin 12.3; Platelets 222.0 02/06/2022: ALT 25; BUN 12; Creatinine, Ser 0.64; Potassium 3.9; Sodium 129  Recent Lipid Panel    Component Value Date/Time   CHOL 135 10/24/2020 1545   CHOL 150  11/11/2018 0946   CHOL 177 10/28/2014 1025   TRIG 100 10/24/2020 1545   TRIG 115 10/28/2014 1025   HDL 60 10/24/2020 1545   HDL 70 11/11/2018 0946   HDL 70 10/28/2014 1025   CHOLHDL 2.3 10/24/2020 1545   VLDL 20 10/24/2020 1545   LDLCALC 55 10/24/2020 1545   LDLCALC 64 11/11/2018 0946   LDLCALC 84 10/28/2014 1025   LDLDIRECT 63 06/15/2020 1446         Physical Exam:    VS:  There were no vitals taken for this visit.    Wt Readings from Last 3 Encounters:  10/16/21 138 lb (62.6 kg)  07/03/21 138 lb (62.6 kg)  04/07/21 142 lb 3.2 oz (64.5 kg)     GEN: Comfortable, NAD HEENT: Normal NECK: No JVD; No carotid bruits CARDIAC: RRR, soft systolic murmur. No rubs or gallops RESPIRATORY:  Coarse crackles at the bases (unchanged). Otherwise clear. ABDOMEN: Soft, non-tender, non-distended MUSCULOSKELETAL:  No edema; No deformity  SKIN: Warm and dry NEUROLOGIC:  Alert and oriented x 3 PSYCHIATRIC:  Normal affect   ASSESSMENT:    No diagnosis found.  PLAN:    In order of problems listed above:  #Non-Ischemic CM:  #Takotsubo cardiomyopathy:  Patient had a recent admission from 10/24/20-10/29/20 where she presented with chest pain, nausea, and vomiting and found to have anterolateral STE on EKG. HsTrop increased from 916>4990. She underwent emergent cardiac catheterization which showed 25% pLAD stenosis, otherwise normal coronary arteries, and EF 25-35% with LV gram c/w takotsubo cardiomyopathy. She was started on plavix for conservative management. TTE showed EF 25-30%, global hypokinesis, G1DD, moderately elevated PA pressures, moderate LAE, and normal structure/function of mitral valve s/p repair/replacement. GDMT has been limited by symptomatic hypotension. Repeat echo 11/22/2020 with LVEF returned to normal 60 to 65%, mild LVH, trivial central MR with no stenosis which has been stable on repeat TTE 11/2021 -TTE with improved EF 60-65% -Unable to tolerate GDMT due to symptomatic  hypotension - Continue plavix 75mg  daily (was on previously due to suspected thromboembolic NSTEMI in the past) - Monitor daily weights  #  MVP c/b severe mitral regurgitation s/p bioprosthetic MVR in 2018:  Stable gradients on most recent TTE 11/2021 with mean gradient 2.81mmHg with trivial central MR.  - Continue routine monitoring   #History of NSTEMI (non-ST elevated myocardial infarction)  Presented with chest pain in 2020. Cath without obstructive disease, however, MRI showed focal LGE in the mid inferolateral wall highly suspicious for thromboembolic event. TTE with LVEF was 60 to 65%. Completed cardiac rehab. Had mild troponin elevation with recent hospitalization with peak 140-->130. No anginal symptoms. Suspect demand in the setting of TIA. Given recent negative cath and absence of chest pain, no further work-up needed at this time. -Continue plavix 75mg  daily (favored over ASA given concern for TIA at that time) -Continue crestor 20mg  daily -Off BB due to orthostatsis -Stopped linisopril due to orthostasis  #Orthostatic Hypotension: Improved with cessation of antihypertensives and jardiance -Compression socks -Elevate legs at end of the day -Slow position changes   #Carotid artery disease s/p right CEA 07/2020: followed by [name] as an outpatient - Continue routine monitoring - Continue plavix 75mg  daily and crestor 20mg  daily as above   #Iron deficiency:  - Continue po iron - Follow-up with PCP for further management.     #UIP:  - Continue home trelegy - Continue to follow with pulmonology   #Hyperlipidemia LDL goal <70 LDL optimal on most recent lab work with LDL 63   -Continue 20mg  daily as above  #Scleroderma: -Follow-up with Rheum as scheduled -RV normal on TTE, PASP not obtained due to insufficient TR signal  #Tobacco Abuse: Quit successfully. -Continue nicotine patches    Medication Adjustments/Labs and Tests Ordered: Current medicines are reviewed at  length with the patient today.  Concerns regarding medicines are outlined above.  No orders of the defined types were placed in this encounter.  No orders of the defined types were placed in this encounter.    There are no Patient Instructions on file for this visit.    Signed, Freada Bergeron, MD  02/10/2022 1:13 PM

## 2022-02-13 ENCOUNTER — Ambulatory Visit: Payer: Medicare Other | Admitting: Cardiology

## 2022-02-18 DIAGNOSIS — Z23 Encounter for immunization: Secondary | ICD-10-CM | POA: Diagnosis not present

## 2022-02-20 ENCOUNTER — Other Ambulatory Visit: Payer: Self-pay | Admitting: *Deleted

## 2022-02-20 MED ORDER — FERROUS SULFATE 325 (65 FE) MG PO TABS: 325.0000 mg | ORAL_TABLET | Freq: Every day | ORAL | 3 refills | Status: DC

## 2022-03-10 ENCOUNTER — Telehealth: Payer: Self-pay | Admitting: Student

## 2022-03-10 NOTE — Telephone Encounter (Signed)
   The patient called the after-hours line reporting that she has been lightheaded and dizzy today. By review of notes, she has a history of orthostatic hypotension and has not been on antihypertensives given this.  She did check her BP today and it was elevated at 153/88 and 156/85. She did have [place] food last night.   She has not been wearing compression stockings and I did recommend that she utilize these. Also recommended that she increase hydration. She has not been checking her blood pressure routinely until today and I did recommend that she check her BP approximately twice per day so we can have a trend of her readings. She does have scheduled follow-up with Dr. Johney Frame in 2 weeks and was encouraged to bring a BP log at that time. We reviewed warning signs to monitor for which would prompt emergent evaluation in the interim. She voiced understanding of this and was appreciative of the return call.   Signed, Erma Heritage, PA-C 03/10/2022, 12:27 PM

## 2022-03-17 NOTE — Progress Notes (Unsigned)
Cardiology Office Note:    Date:  03/17/2022   ID:  [name], DOB 1951/05/06, MRN [id number]  PCP:  Joy Reid, PA-C   [facility] Providers Cardiologist:  Freada Bergeron, MD {   Referring MD: Joy Reid, PA-C    History of Present Illness:    Joy Williams is a 71 y.o. female with a hx of MVP s/p minimally invasive bioprosthetic MVR, carotid artery disease s/p R CEA, scleroderma, cold agglutinin disease, raynaud's disease, UIP, NSCLC, COPD, hypothyroidism and recent admission for STEMI found to have clean coronaries on cath with diagnosis of Takostubo CM discharged on 10/29/20 and recent readmission for dizziness on 11/03/20 who now presents to clinic for follow-up.  Patient was admitted 02/24/19-02/25/19 with chest discomfort.  High-sensitivity troponin levels were elevated (1471 >> 1625 >> 1246 >> 1150).  There was concern for NSTEMI., however, cath demonstrated no CAD.  TTE demonstrated normal LV function and normally functioning mitral valve prosthesis.  She was followed by Dr. Debara Pickett in the hospital and he felt that her presentation was c/w [facility] (myocardial infarction with non-obstructive coronary arteries) or non-ACS troponin elevation.  The possible causes included myocarditis, coronary spasm, cardioembolic event. Underwent cardiac MRI that showed focal subendocardial LGE into mid inferolateral wall consistent with infarct seen on territory with concern for a thromboembolic event given clean cath. She was managed medically and worked with cardiac rehab at that time.   In November of 2020, she developed palpitations. 4 week zio placed with brief runs of SVT but no significant arrhythmias or Afib   Re-admitted to [facility] from 08/06/20-08/10/20 where she presented for acute onset dizziness, left arm incoordination and HA. CT head negative for acute abnormalities. MRI brain without acute infarct but showed multiple scattered remote lacunar infarcts. 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 TTE with EF 65 to 70%, NWMA, mild LVH, moderate left atrial size dilated, mild MVR, no source of emboli noted. She underwent right CEA on 08/09/20 with [name]. She was continued on ASA, plavix and statin.  Re-admitted again from 10/24/20-10/29/20 where she presented with chest pain and nausea found to have STE in I and aVL for which a STEMI was called. She was taken to the cath lab which showed 25% pLAD stenosis, otherwise normal coronary arteries, and EF 25-35% with LV gram c/w takotsubo cardiomyopathy. She was started on plavix. TTE showed EF 25-30%, global hypokinesis, G1DD, moderately elevated PA pressures, moderate LAE, and normal structure/function of mitral valve s/p repair/replacement. GDMT was limited due to hypotension and she was discharged on 10/29/20.  Re-presented to the ER on 11/03/20 with dizziness and orthostatic symptoms. Trop 41-->44 which was down from prior admission. Symptoms thought to be due to orthostasis and farxiga discontinued.  Saw [facility] on 11/06/20 where she was doing better. Dizziness improved. Bedside echo with improved LVEF.  She was seen 11/15/2020 where she was feeling much improved since last admission.  Repeat echo 11/22/2020 with LVEF returned to normal 60 to 65%, mild LVH, trivial central MR with no stenosis.  Last seen in clinic on 06/2021 where she was doing well from a CV standpoint. TTE 12/04/21 with LVEF 60-65%, Normal RV, moderate LAE, normal functioning MV prosthesis with mean gradient 2.45mmHG at HR 63bpm.  Has been followed by Pulmonary (Dr. 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, each treated by wedge resection with post-op adjuvant chemoRx at [facility]   MVP (mitral valve prolapse) 2018   s/p Minimally-Invasive Mitral Valve Replacement w/  [facility] Mitral bovine bioprosthetic tissue valve (size 4mm, model # 7300TFX, serial # [id number])   PONV (postoperative nausea and vomiting)    Raynaud's syndrome    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   31 mm [facility] mitral bovine bioprosthetic tissue valve placed via right mini thoracotomy approach   Severe mitral regurgitation 11/15/2014   Shortness of breath dyspnea    with exertion   STD (sexually transmitted disease)    STEMI (ST elevation myocardial infarction) ([place]) 10/24/2020   Telangiectasia     Past Surgical History:  Procedure Laterality Date   BACK SURGERY     x 3  Disectomy   BREAST BIOPSY Left    CARDIAC CATHETERIZATION N/A 03/08/2016   Procedure: Right/Left Heart Cath and Coronary Angiography;  Surgeon: Sherren Mocha, MD;  Location: [place] [facility];  Service: Cardiovascular;  Laterality: N/A;   CLAVICLE SURGERY Left 2013   plate to left collar bone   ENDARTERECTOMY Right 08/09/2020   Procedure: RIGHT CAROTID ENDARTERECTOMY;  Surgeon: Waynetta Sandy, MD;  Location: [place];  Service: Vascular;  Laterality: Right;   LAPAROSCOPY     ? reason-age 77    LEFT HEART CATH AND CORONARY ANGIOGRAPHY N/A 02/25/2019   Procedure: LEFT HEART CATH  AND CORONARY ANGIOGRAPHY;  Surgeon: Jettie Booze, MD;  Location: [place] [facility];  Service: Cardiovascular;  Laterality: N/A;   LEFT HEART CATH AND CORONARY ANGIOGRAPHY N/A 10/24/2020   Procedure: LEFT HEART CATH AND CORONARY ANGIOGRAPHY;  Surgeon: Jettie Booze, MD;  Location: [place] [facility];  Service: Cardiovascular;  Laterality: N/A;   LUNG CANCER SURGERY     MITRAL VALVE REPAIR Right 06/27/2016   Procedure: MINIMALLY INVASIVE MITRAL VALVE REPLACEMENT;  Surgeon: Rexene Alberts, MD;  Location: [place];  Service: Open Heart Surgery;  Laterality: Right;   TEE WITHOUT CARDIOVERSION N/A 02/22/2016   Procedure: TRANSESOPHAGEAL ECHOCARDIOGRAM (TEE);  Surgeon: Dorothy Spark, MD;  Location: [place];  Service: Cardiovascular;  Laterality: N/A;   TEE WITHOUT CARDIOVERSION N/A 06/27/2016   Procedure: TRANSESOPHAGEAL ECHOCARDIOGRAM (TEE);  Surgeon: Rexene Alberts, MD;  Location: [place];  Service: Open Heart Surgery;  Laterality: N/A;   TONSILLECTOMY     VIDEO ASSISTED THORACOSCOPY (VATS)/WEDGE RESECTION Left 12/30/2014   Bronchoscopy, Mediastinoscopy, Left VATS for Wedge resection LUL x2 adn LLL x1 - Dr. Elenor Quinones at [place] Left 2017   injected with botox  wrist surgery Left 2015   plate to wrist     Current Medications: No outpatient medications have been marked as taking for the 03/19/22 encounter (Appointment) with Freada Bergeron, MD.     Allergies:   Penicillins and Bee venom   Social History   Socioeconomic History   Marital status: Divorced    Spouse name: Not on file   Number of children: Not on file   Years of education: 16   Highest education level: Not on file  Occupational History   Occupation: Retired  Tobacco Use   Smoking status: Some Days    Packs/day: 1.00    Years: 24.00    Total pack years: 24.00    Types: Cigarettes    Start date: 05/21/1983   Smokeless tobacco: Never   Tobacco comments:    Currently smoking 5 cigs a  day as of 10/16/21 ep  Vaping Use   Vaping Use: Never used  Substance and Sexual Activity   Alcohol use: Yes    Alcohol/week: 2.0 standard drinks of alcohol    Types: 1 Shots of liquor, 1 Standard drinks or equivalent per week    Comment: occ   Drug use: No   Sexual activity: Not Currently    Partners: Male    Birth control/protection: Post-menopausal  Other Topics Concern   Not on file  Social History Narrative   Not on file   Social Determinants of Health   Financial Resource Strain: Low Risk  (10/26/2020)   Overall Financial Resource Strain (CARDIA)    Difficulty of Paying Living Expenses: Not very hard  Food Insecurity: No Food Insecurity (10/26/2020)   Hunger Vital Sign    Worried About Running Out of Food in the Last Year: Never true    Ran Out of Food in the Last Year: Never true  Transportation Needs: Unmet Transportation Needs (10/26/2020)   PRAPARE - Hydrologist (Medical): No    Lack of Transportation (Non-Medical): Yes  Physical Activity: Not on file  Stress: Not on file  Social Connections: Not on file     Family History: The patient's family history includes Dementia in her father; Healthy in her daughter and son; Mitral valve prolapse in her brother, mother, and sister; Prostate cancer in her father. There is no history of Colon cancer, Colon polyps, Rectal cancer, Esophageal cancer, Stomach cancer, or Heart attack.  ROS:   Please see the history of present illness.    Review of Systems  Constitutional:  Negative for chills and fever.  HENT:  Negative for hearing loss.   Eyes:  Negative for blurred vision.  Respiratory:  Negative for shortness of breath.   Cardiovascular:  Negative for chest pain, palpitations, orthopnea, claudication, leg swelling and PND.  Gastrointestinal:  Negative for blood in stool, nausea and vomiting.  Genitourinary:  Negative for dysuria and hematuria.  Musculoskeletal:  Negative for falls.  Neurological:   Negative for dizziness and loss of consciousness.  Endo/Heme/Allergies:  Bruises/bleeds easily.  Psychiatric/Behavioral:  Negative for substance abuse.      EKGs/Labs/Other Studies Reviewed:    The following studies were reviewed today: Echo 11/22/20  1. Left ventricular ejection fraction, by estimation, is 60 to 65%. Left  ventricular ejection fraction by 3D volume is 62 %. The left ventricle has  normal function. There is mild concentric left ventricular hypertrophy.  Left ventricular diastolic  function could not be evaluated.   2. Right ventricular systolic function is normal. The  right ventricular  size is normal. There is normal pulmonary artery systolic pressure. The  estimated right ventricular systolic pressure is 38.4 mmHg.   3. Left atrial size was mild to moderately dilated.   4. 31 mm Magna Ease in the mitral position 06/27/2016. MG 2 mmHG @ 65 bpm.  EOA 2.27 cm2. Trivial central MR. No stenosis. Normal prosthesis. Trivial  mitral valve regurgitation. The mean mitral valve gradient is 2.0 mmHg  with average heart rate of 65 bpm.  There is a 31 mm bioprosthetic valve present in the mitral position.  Procedure Date: 06/27/16.   5. The aortic valve is tricuspid. Aortic valve regurgitation is not  visualized. No aortic stenosis is present.   6. The inferior vena cava is normal in size with greater than 50%  respiratory variability, suggesting right atrial pressure of 3 mmHg.   Comparison(s): Changes from prior study are noted. EF is now 60-65%. WMA  have resolved.  LHC 10/24/20: Prox LAD lesion is 25% stenosed. No significant CAD. The left ventricular systolic function is normal. LV end diastolic pressure is mildly elevated. The left ventricular ejection fraction is 25-35% by visual estimate. There is no aortic valve stenosis. Prior mitral valve surgery.   Despite lateral ST elevation, we only found nonobstructive coronary artery disease.  Left ventriculogram fits with a  pattern of Takotsubo cardiomyopathy.  LV function significantly different from her prior catheterization.  She will need aggressive medical therapy for LV dysfunction.   I discussed the findings with her daughter.    10/18/2020: IMPRESSION: 1. There is mild pulmonary fibrosis in a pattern with apical to basal gradient, featuring irregular peripheral interstitial opacity, septal thickening, traction bronchiectasis, and subpleural bronchiolectasis at the lung basis without clear evidence of honeycombing. Fibrotic findings are worsened over time on prior examinations dating back to 11/16/2014. Findings are categorized as probable UIP per consensus guidelines: Diagnosis of Idiopathic Pulmonary Fibrosis: An Official ATS/ERS/JRS/ALAT Clinical Practice Guideline. [place], Iss 5, [phone], Jan 18 2017. 2. Redemonstrated postoperative findings of anterior left upper lobe and dependent left lower lobe wedge resections. No evidence of malignant recurrence in the chest. 3. Coronary artery disease.   Aortic Atherosclerosis (ICD10-I70.0).   Echocardiogram 10/25/20: 1. Left ventricular ejection fraction, by estimation, is 25 to 30%. The  left ventricle has severely decreased function. The left ventricle  demonstrates global hypokinesis. Left ventricular diastolic parameters are  consistent with Grade I diastolic  dysfunction (impaired relaxation).   2. Right ventricular systolic function is normal. The right ventricular  size is normal. There is moderately elevated pulmonary artery systolic  pressure. The estimated right ventricular systolic pressure is 01.7 mmHg.   3. Left atrial size was moderately dilated.   4. The mitral valve has been repaired/replaced. No evidence of mitral  valve regurgitation. No evidence of mitral stenosis. Severe mitral annular  calcification. Echo findings are consistent with normal structure and  function of the mitral valve  prosthesis.    5. The aortic valve is normal in structure. Aortic valve regurgitation is  not visualized. No aortic stenosis is present.   6. The inferior vena cava is normal in size with greater than 50%  respiratory variability, suggesting right atrial pressure of 3 mmHg.   Comparison(s): Prior images unable to be directly viewed, comparison made  by report only. The left ventricular function is worsened.  EKG:  No new tracing.  Recent Labs: 10/16/2021: Hemoglobin 12.3; Platelets 222.0 02/06/2022: ALT 25; BUN 12;  Creatinine, Ser 0.64; Potassium 3.9; Sodium 129  Recent Lipid Panel    Component Value Date/Time   CHOL 135 10/24/2020 1545   CHOL 150 11/11/2018 0946   CHOL 177 10/28/2014 1025   TRIG 100 10/24/2020 1545   TRIG 115 10/28/2014 1025   HDL 60 10/24/2020 1545   HDL 70 11/11/2018 0946   HDL 70 10/28/2014 1025   CHOLHDL 2.3 10/24/2020 1545   VLDL 20 10/24/2020 1545   LDLCALC 55 10/24/2020 1545   LDLCALC 64 11/11/2018 0946   LDLCALC 84 10/28/2014 1025   LDLDIRECT 63 06/15/2020 1446         Physical Exam:    VS:  There were no vitals taken for this visit.    Wt Readings from Last 3 Encounters:  10/16/21 138 lb (62.6 kg)  07/03/21 138 lb (62.6 kg)  04/07/21 142 lb 3.2 oz (64.5 kg)     GEN: Comfortable, NAD HEENT: Normal NECK: No JVD; No carotid bruits CARDIAC: RRR, soft systolic murmur. No rubs or gallops RESPIRATORY:  Coarse crackles at the bases (unchanged). Otherwise clear. ABDOMEN: Soft, non-tender, non-distended MUSCULOSKELETAL:  No edema; No deformity  SKIN: Warm and dry NEUROLOGIC:  Alert and oriented x 3 PSYCHIATRIC:  Normal affect   ASSESSMENT:    No diagnosis found.  PLAN:    In order of problems listed above:  #Non-Ischemic CM:  #Takotsubo cardiomyopathy:  Patient had a recent admission from 10/24/20-10/29/20 where she presented with chest pain, nausea, and vomiting and found to have anterolateral STE on EKG. HsTrop increased from 916>4990. She underwent  emergent cardiac catheterization which showed 25% pLAD stenosis, otherwise normal coronary arteries, and EF 25-35% with LV gram c/w takotsubo cardiomyopathy. She was started on plavix for conservative management. TTE showed EF 25-30%, global hypokinesis, G1DD, moderately elevated PA pressures, moderate LAE, and normal structure/function of mitral valve s/p repair/replacement. GDMT has been limited by symptomatic hypotension. Repeat echo 11/22/2020 with LVEF returned to normal 60 to 65%, mild LVH, trivial central MR with no stenosis. -TTE with improved EF 60-65% - Unable to tolerate GDMT due to symptomatic hypotension - Continue plavix 75mg  daily (was on previously due to suspected thromboembolic NSTEMI in the past) - Monitor daily weights  #MVP c/b severe mitral regurgitation s/p bioprosthetic MVR in 2018:  Stable gradients on most recent TTE - Continue routine monitoring   #History of NSTEMI (non-ST elevated myocardial infarction)  Presented with chest pain in 2020. Cath without obstructive disease, however, MRI showed focal LGE in the mid inferolateral wall highly suspicious for thromboembolic event. TTE with LVEF was 60 to 65%. Completed cardiac rehab. Had mild troponin elevation with recent hospitalization with peak 140-->130. No anginal symptoms. Suspect demand in the setting of TIA. Given recent negative cath and absence of chest pain, no further work-up needed at this time. -Continue plavix 75mg  daily (favored over ASA given concern for TIA at that time) -Continue crestor 20mg  daily -Off BB due to orthostatsis -Stopped linisopril due to orthostasis  #Orthostatic Hypotension: Improved with cessation of antihypertensives and jardiance -Compression socks -Elevate legs at end of the day -Slow position changes   #Carotid artery disease s/p right CEA 07/2020: followed by [name] outpatient - Continue routine monitoring - Continue plavix 75mg  daily and crestor 20mg  daily as above   #Iron  deficiency:  - Continue po iron - Follow-up with PCP for further management.     #UIP:  - Continue home trelegy - Continue to follow with pulmonology   #Hyperlipidemia LDL  goal <70 LDL optimal on most recent lab work with LDL 63   -Change to crestor 20mg  daily as above  #Scleroderma with CREST: #Pulmonary Fibrosis: Most recent TTE with normal RV. Unable to assess PASP due to inadequate TR jet. -Follows with Pulm and Rheum -Continue yearly monitoring of TTEs for RV and PASP  #Tobacco Abuse: Quit successfully. -Continue nicotine patches    Medication Adjustments/Labs and Tests Ordered: Current medicines are reviewed at length with the patient today.  Concerns regarding medicines are outlined above.  No orders of the defined types were placed in this encounter.  No orders of the defined types were placed in this encounter.    There are no Patient Instructions on file for this visit.    Signed, Freada Bergeron, MD  03/17/2022 7:21 PM

## 2022-03-18 ENCOUNTER — Encounter: Payer: Self-pay | Admitting: Internal Medicine

## 2022-03-18 ENCOUNTER — Ambulatory Visit (INDEPENDENT_AMBULATORY_CARE_PROVIDER_SITE_OTHER): Payer: Medicare Other | Admitting: Internal Medicine

## 2022-03-18 VITALS — BP 118/62 | HR 74 | Temp 98.0°F | Ht 66.0 in | Wt 130.6 lb

## 2022-03-18 DIAGNOSIS — Z7189 Other specified counseling: Secondary | ICD-10-CM

## 2022-03-18 DIAGNOSIS — J849 Interstitial pulmonary disease, unspecified: Secondary | ICD-10-CM | POA: Diagnosis not present

## 2022-03-18 DIAGNOSIS — F17210 Nicotine dependence, cigarettes, uncomplicated: Secondary | ICD-10-CM | POA: Diagnosis not present

## 2022-03-18 DIAGNOSIS — R768 Other specified abnormal immunological findings in serum: Secondary | ICD-10-CM

## 2022-03-18 DIAGNOSIS — E871 Hypo-osmolality and hyponatremia: Secondary | ICD-10-CM | POA: Diagnosis not present

## 2022-03-18 DIAGNOSIS — Z8679 Personal history of other diseases of the circulatory system: Secondary | ICD-10-CM

## 2022-03-18 DIAGNOSIS — R0602 Shortness of breath: Secondary | ICD-10-CM

## 2022-03-18 DIAGNOSIS — Z87891 Personal history of nicotine dependence: Secondary | ICD-10-CM

## 2022-03-18 DIAGNOSIS — K449 Diaphragmatic hernia without obstruction or gangrene: Secondary | ICD-10-CM | POA: Diagnosis not present

## 2022-03-18 DIAGNOSIS — Z85118 Personal history of other malignant neoplasm of bronchus and lung: Secondary | ICD-10-CM | POA: Diagnosis not present

## 2022-03-18 LAB — PULMONARY FUNCTION TEST
DL/VA % pred: 83 %
DL/VA: 3.4 ml/min/mmHg/L
DLCO cor % pred: 66 %
DLCO cor: 13.7 ml/min/mmHg
DLCO unc % pred: 66 %
DLCO unc: 13.7 ml/min/mmHg
FEF 25-75 Pre: 1.88 L/sec
FEF2575-%Pred-Pre: 95 %
FEV1-%Pred-Pre: 82 %
FEV1-Pre: 2 L
FEV1FVC-%Pred-Pre: 106 %
FEV6-%Pred-Pre: 80 %
FEV6-Pre: 2.48 L
FEV6FVC-%Pred-Pre: 104 %
FVC-%Pred-Pre: 77 %
FVC-Pre: 2.48 L
Pre FEV1/FVC ratio: 81 %
Pre FEV6/FVC Ratio: 100 %

## 2022-03-18 MED ORDER — VARENICLINE TARTRATE (STARTER) 0.5 MG X 11 & 1 MG X 42 PO TBPK: ORAL_TABLET | ORAL | 0 refills | Status: DC

## 2022-03-18 NOTE — Patient Instructions (Signed)
Spirometry and DLCO Performed Today.  

## 2022-03-18 NOTE — Progress Notes (Signed)
OV 10/12/2020  Subjective:  Patient ID: Joy [facility], female , DOB: Sep 29, 1950 , age 71 y.o. , MRN: [id number] , ADDRESS: [address] [place] [address] PCP Lorrene Reid, PA-C Patient Care Team: Lorrene Reid, PA-C as PCP - General Freada Bergeron, MD as PCP - Cardiology (Cardiology) Dorothy Spark, MD (Inactive) as Consulting Physician (Cardiology) Tanda Rockers, MD as Consulting Physician (Pulmonary Disease) Hurley Cisco, MD as Consulting Physician (Rheumatology) Lerry Paterson, MD as Referring Physician Lissa Morales, MD as Referring Physician (Internal Medicine) [name], MD as Consulting Physician (Infectious Diseases)  This Provider for this visit: Treatment Team:  Attending Provider: Brand Males, MD    10/12/2020 -   Chief Complaint  Patient presents with   Consult    Pt is being referred to Dr. Chase Caller by Dr. Melvyn Novas.  Pt states that she has an occ cough but denies any other complaints.   Transfer of care from Dr. Christinia Gully to Dr. Chase Caller in the ILD center.  History is gained from talking to the patient and review of the medical records including [place] and visualization of 2016 or 2017 CT scan of the chest.  HPI Joy Williams 71 y.o. -used to live in [place].  She worked in the [employer] for [employer].  In 2016 she retired and came to [place].  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  Her primary care physician thought she might have crest syndrome because she had Raynaud's along with allergic cases and she had edema in her fingers.  After she moved here apparently she saw a rheumatologist [review of the chart indicates is Dr. Charlestine Night who has since retired] and was told that she might not have scleroderma.  She has been on observation therapy from a rheumatological standpoint.  In fact never seen a rheumatologist.   Then in 2016 she had early stage non-small cell lung cancer for which she underwent wedge resection left upper lobe.  She is followed up with [facility] since then.  She has had annual CT scans of the chest.  Most recent 1 was in May 2021.  She believes and per record review that she is in complete remission and I believe she might be discharged from follow-up there.  She tells me that at baseline she has very mild shortness of breath that is stable as documented below.  She also has amount of cough that is also documented below.  Is not clear that this is necessarily worse.  She is maintained on inhaled corticosteroid and long-acting beta agonist.  She is not sure if this is because of her diagnose of COPD other than the fact that she is on it for respiratory symptoms.  Most recently when she followed up with Dr. Gustavus Bryant review of the records revealed that there was interstitial lung disease findings on the CT scan of the chest in May 2021 at [facility].  In my review of the notes it appears that the pattern might have changed.  Infection was present even in 2016 2017 and currently the pattern might be changed.  She is definitely not feeling it.  Her symptoms are minimal and her walking desaturation test is normal.  Because of the ILD changes she has been referred to the ILD center    CT Chest data 09/23/19 at [place] (no image avail)  Interface, Rad Results In - 09/23/2019 11:22  AM EDT  Formatting of this note might be different from the original.  CT Chest   Indication:  Non-small cell lung cancer, monitor, C34.92 Malignant neoplasm  of unspecified part of left bronchus or lung (CMS-HCC).   Comparison Exams:  06/10/2019   Protocol:  Chest CT WO Protocol.  Contiguous 0.625 mm axial images with 5  mm axial, 3 mm coronal, and 3 mm sagittal reconstructions were obtained  from the neck base through the upper abdomen without IV contrast material.  In addition, 3-D maximal intensity projection  (MIP) reconstructions were  performed to potentially increase the sensitivity for the detection of  pulmonary nodules.   Findings:   The central airways are patent without endobronchial mass. The patient is  status post left upper lobe wedge resection which is stable in appearance.  Status post left lower lobe wedge resection with a similar appearance of  peripheral consolidation and groundglass lung with traction bronchiectasis  adjacent to the staple line. Redemonstrated findings of lower lobe  predominant pulmonary fibrosis with subpleural reticulation and probable  UIP pattern. Stable sub-6 mm pulmonary nodules and nodular opacities are  similar, index 5 mm pulmonary nodule in the anterior pleura of the right  upper lobe ([id number]). New left lower lobe nodule on image 4-300 is favored to  represent endoluminal debris. No effusion or pneumothorax. High density  focus along the right diaphragm may represent pleural calcification.  Scattered calcified and noncalcified mediastinal and hilar lymph nodes,  measuring up to 1.3 cm in the left lower paratracheal region   Status post mitral valve are clear with mild cardiomegaly and coronary  artery calcifications in the LAD. No pericardial effusion.   There is a small hiatal hernia no aggressive osseous lesions.   The thyroid gland is unremarkable. No axillary adenopathy or chest wall  abnormality.   Impression:   Stable versus slightly increased in subpleural consolidation in the left  lower lobe adjacent to the surgical staple line. Given curvilinear  appearance on sagittal imaging, findings are favored to represent confluent  fibrosis over malignancy. Consider additional follow-up chest CT in 3-6  months.   Additional nodules/nodular opacities are similar. Attention on follow-up.   Electronically Reviewed by:  Cheryl Flash, MD, [facility]  Electronically Reviewed on:  09/23/2019 11:14 AM    No results found.   Dec 30 2014 removed LUL lung tissue with 2 tumors/ and L vc paralyzed ever since - last chemo 05/06/15   Last [facility] onc note:  Date of diagnosis: 12/2014 Diagnosis: NSCLC (Synchronous primaries) Histology: Adenocarcinoma Mutation status: EGFR pending, ALK, ROS1 negative Stage: Adenocarcinoma: T1aN0M0 Squamous T2aN0M0 Prior therapy: 1. Surgical resection with wedge resection and mediastinal lymph node s   OV 02/15/2021  Subjective:  Patient ID: Joy [facility], female , DOB: Aug 15, 1950 , age 64 y.o. , MRN: [id number] , ADDRESS: [address] [place] [phone] PCP Lorrene Reid, PA-C Patient Care Team: Lorrene Reid, PA-C as PCP - General Freada Bergeron, MD as PCP - Cardiology (Cardiology) Tanda Rockers, MD as Consulting Physician (Pulmonary Disease) Hurley Cisco, MD as Consulting Physician (Rheumatology) Lerry Paterson, MD as Referring Physician Lissa Morales, MD as Referring Physician (Internal Medicine) [name], MD as Consulting Physician (Infectious Diseases) [name], Rounding, MD as Rounding Team (Cardiology)  This Provider for this visit: Treatment Team:  Attending Provider: Brand Males, MD    02/15/2021 -   Chief Complaint  Patient presents with   Follow-up    PFT performed today.  Pt states she has been doing okay since last visit and states she is about the same.   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 -presents for follow-up.  Last seen in May 2022.  At the time initiated ILD work-up.  Her ANA is strongly positive.  She has history of Raynaud's.   So we sent her to rheumatology.  She saw Dr Estanislado Pandy only recently.  First note reviewed.  No specific connective tissue disease identified. AVISE was sent out and they texted me today saying the results were negative.  Patient continues to overall feel the same.  She had pulmonary function test that shows decline over time.  Approximately 6 %/year over the last 18 months.  This is when FVC and DLCO are averaged.  She is definitely not feeling the difference.  Of note she had a Takotsubo cardiomyopathy in June 2022.  By July 2022 her echo normalized.  She will consult cardiac rehabilitation soon.  She continues to smoke but has reduced.  She nondistended importance of quitting smoking.  She will have a high-dose flu shot today.  We discussed COVID booster by Valent.       IMPRESSION: 1. There is mild pulmonary fibrosis in a pattern with apical to basal gradient, featuring irregular peripheral interstitial opacity, septal thickening, traction bronchiectasis, and subpleural bronchiolectasis at the lung basis without clear evidence of honeycombing. Fibrotic findings are worsened over time on prior examinations dating back to 11/16/2014. Findings are categorized as probable UIP per consensus guidelines: Diagnosis of Idiopathic Pulmonary Fibrosis: An Official ATS/ERS/JRS/ALAT Clinical Practice Guideline. [place], Iss 5, [phone], Jan 18 2017. 2. Redemonstrated postoperative findings of anterior left upper lobe and dependent left lower lobe wedge resections. No evidence of malignant recurrence in the chest. 3. Coronary artery disease.   Aortic Atherosclerosis (ICD10-I70.0).     Electronically Signed   By: Eddie Candle M.D.   OnResults for KAMALJIT, HIZER (MRN [id number]) as of 10/20/2020 16:40  Ref. Range 10/12/2020 16:37  Anti Nuclear Antibody (ANA) Latest Ref Range: NEGATIVE  POSITIVE (A)  ANA Pattern 1 Unknown Nuclear, Nucleolar (A)  ANA Titer 1 Latest Units:  titer 4:7,829 (H)  Cyclic Citrullin Peptide Ab Latest Units: UNITS <16  ds DNA Ab Latest Units: IU/mL <1  Myeloperoxidase Abs Latest Units: AI <1.0  Serine Protease 3 Latest Units: AI <1.0  RA Latex Turbid. Latest Ref Range: <14 IU/mL <14  SSA (Ro) (ENA) Antibody, IgG Latest Ref Range: <1.0 NEG AI <1.0 NEG  SSB (La) (ENA) Antibody, IgG Latest Ref Range: <1.0 NEG AI <1.0 NEG  Scleroderma (Scl-70) (ENA) Antibody, IgG Latest Ref Range: <1.0 NEG AI <1.0 NEG      OV 03/26/2021  Subjective:  Patient ID: Joy Williams, female , DOB: 02/01/1951 , age 86 y.o. , MRN: [id number] , ADDRESS: [address] [place] [phone] PCP Lorrene Reid, PA-C Patient Care Team: Lorrene Reid, PA-C as PCP - General Freada Bergeron, MD as PCP - Cardiology (Cardiology) Tanda Rockers, MD as Consulting Physician (Pulmonary Disease) Hurley Cisco, MD as Consulting Physician (Rheumatology) Lerry Paterson, MD as Referring Physician  Lissa Morales, MD as Referring Physician (Internal Medicine) [name], MD as Consulting Physician (Infectious Diseases) [name], Michae Kava, MD as Rounding Team (Cardiology)  This Provider for this visit: Treatment Team:  Attending Provider: Brand Males, MD   03/26/2021 -  non-IPF ILD (IPAF) progressive phenotype.    HPI LAURENA VALKO 71 y.o. - strted ofev 03/22/21. No diarrhea. Occ mild headache. Noticing mild change in appetite which she is keeping track of it. Went to rehab today and wanted to do video visit instead of face to face.  We discussed participation in clinical research.  She is very interested.  She will start off with doing the ILD-Pro registry.  I will have the research team send her a consent form.  If there is any interventional trial in the future then we will contact her about it.  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  And this included  CT Chest data  No results  found.  Results for ALAYNA, MABE (MRN [id number]) as of 03/26/2021 14:11  Ref. Range 11/30/2020 11:10  AST Latest Ref Range: 0 - 40 IU/L 23  ALT Latest Ref Range: 0 - 32 IU/L 12       OV 10/16/2021  Subjective:  Patient ID: Joy Williams, female , DOB: [birth year] , age 74 y.o. , MRN: [id number] , ADDRESS: [address] [place] [phone] PCP Lorrene Reid, PA-C Patient Care Team: Lorrene Reid, PA-C as PCP - General Freada Bergeron, MD as PCP - Cardiology (Cardiology) Tanda Rockers, MD as Consulting Physician (Pulmonary Disease) Hurley Cisco, MD as Consulting Physician (Rheumatology) Lerry Paterson, MD as Referring Physician Lissa Morales, MD as Referring Physician (Internal Medicine) [name], MD as Consulting Physician (Infectious Diseases) [name], Rounding, MD as Rounding Team (Cardiology)  This Provider for this visit: Treatment Team:  Attending Provider: Brand Males, MD     10/16/2021 -   Chief Complaint  Patient presents with   Follow-up    Patient has been having side effects from the [place] medication which is the reason for today's visit. Pt states she has had to cut her OFEV down to taking 1 pill a day versus the recommended 2 pills daily.     HPI URVI IMES 71 y.o. -returns for follow-up.  Clinically feels stable although symptom scores are worse compared to 1 year ago.  The main reason is that she is having severe diarrhea on full dose nintedanib.  She reduced it to 150 mg once daily and the diarrhea improved.  Now for the last 4 days she is stopped taking nintedanib because she ran out.  She tried several measures to control the diarrhea including taking Imodium all the time but she does not want to do it.  At baseline she does eat spicy food.  She also takes 1 Stevia a day along with 1 cup of coffee but there is no excessive sweetener use or dessert use.  She does not want to change her diet because she  feels at baseline she was fine.  Symptom wise she is stable but how she feels but over the course of 1 year his shortness of breath appears to be worse.  Her last CT scan of the chest was in June 2022 Last pulmonary function test was in September 2022.  She has upcoming vacation to the [employer]   She is interested in ILD-Pro registry study but she missed the window because she did not reply  to research coordinator phone calls.  However she remains interested.   PFT   OV 03/18/2022  Subjective:  Patient ID: Joy Williams, female , DOB: 1951-05-07 , age 34 y.o. , MRN: [id number] , ADDRESS: [address] [place] [address] PCP Lorrene Reid, PA-C Patient Care Team: Lorrene Reid, PA-C as PCP - General Freada Bergeron, MD as PCP - Cardiology (Cardiology) Tanda Rockers, MD as Consulting Physician (Pulmonary Disease) Hurley Cisco, MD as Consulting Physician (Rheumatology) Lerry Paterson, MD as Referring Physician Lissa Morales, MD as Referring Physician (Internal Medicine) [name], MD as Consulting Physician (Infectious Diseases) [name], Rounding, MD as Rounding Team (Cardiology)  This Provider for this visit: Treatment Team:  Attending Provider: Brand Males, MD    03/18/2022 -   Chief Complaint  Patient presents with   Follow-up    PFT performed today.  Pt states she has been doing okay since last visit. States she has had some congestion and also problems with coughing in the mornings.    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 "   - start ofev 141m 03/22/21 -> changed to low-dose in May 2023 due to GI side effects  Follow-up ongoing smoking  History of left upper lobe non-small cell lung cancer resection 2016  - T2aN0,  2.8 cm, adenocarcinoma ( + visceral invasion - Rx [facility] = Adjuvant chemotherapy - 4 cycles under the direction of Dr. SAniceto Boss -in remission as of September 2023  Evidence of histoplasma in 2016 hyalinized granuloma during left upper lobe wedge resection  Small hiatal hernia on CT scan chest September 2022 monitor for acid reflux symptoms  Other significant cardiac history -follows Dr. HGwyndolyn Kaufman -June 2022 hospitalization for Takotsubo cardiomyopathy with ejection fraction 25-35%.   -History of intolerance to GDMT treatment.  On Plavix  -Mitral valve prolapse status post severe mitral regurgitation status post bioprosthetic mitral valve replacement 2018  -Carotid artery disease status post right CEA March 2022  History of iron deficiency anemia  Age-related osteoporosis   HPI DLIEN LYMAN89y.o. -returns for follow-up.  She is a mother of SAbelino Derrickwho I got a note to the [employer]  Since her last visit she is doing well overall.  She is tolerating the low-dose of nintedanib well.  She had recent liver function tests monitoring but her sodium is low at 129 on February 06, 2022.  Her baseline is always between 130-132 on the lower side but this is even further lower.  She is not on any hydrochlorothiazide.  She is not on Lasix currently.  She continues to smoke.  Her symptoms are stable although she is reporting worsening cough there is no wheezing the Trelegy does help but despite this during the night and early morning she is having worsening cough.  She did have a CT scan of the chest that shows progression over time including from last year.  Her most recent  pulmonary function test is slightly improved from a year ago but FVC is slightly  down compared to a year and a half 2 years ago.  According to radiology ILD is progressive even compared to a year ago.  Definitely more progressive over time.  Overall it appears the nintedanib might be helping her.   In terms of her smoking we discussed this.  She is interested in quitting.  She has never tried Chantix in the past.  We did discuss dreams and other side effects from it.  She denies any suicidal ideations.  We discussed how to quit smoking with the help of Chantix.  We at least went 3 minutes quitting smoking   Her lung cancer is in remission.   Based on CT chest September 2023    SYMPTOM SCALE - ILD 10/12/2020  10/16/2021 138# 03/18/2022   O2 use ra ra ra  Shortness of Breath 0 -> 5 scale with 5 being worst (score 6 If unable to do)    At rest 0 1 0  Simple tasks - showers, clothes change, eating, shaving 0 1 0  Household (dishes, doing bed, laundry) 1 2 0  Shopping 2 2 0  Walking level at own pace 1 1 0  Walking up Stairs _0 Total (30-36) Dyspnea Score _1 How bad is your cough? 2.5 3 3.5  How bad is your fatigue 2._2 How bad is nausea 0 0 0  How bad is vomiting?  0 0 0  How bad is diarrhea? 0 0 afer stopping all ofev 2  How bad is anxiety? 1 1 0  How bad is depression 0 0 0       Simple office walk 185 feet x  3 laps goal with forehead probe 10/12/2020   O2 used ra  Number laps completed 3  Comments about pace avg  Resting Pulse Ox/HR 100% and 72/min  Final Pulse Ox/HR 100% and 95/min  Desaturated </= 88% no  Desaturated <= 3% points no  Got Tachycardic >/= 90/min yes  Symptoms at end of test No complaints  Miscellaneous comments x   CT Chest data - HRCT Sept 2023  IMPRESSION: 1. Moderate pulmonary fibrosis in a pattern with apical to basal gradient, featuring irregular peripheral interstitial opacity, septal thickening, traction bronchiectasis, and  subpleural bronchiolectasis at the lung bases, now with some evidence of honeycombing, slightly worsened compared to prior examination dated 10/18/2020 and more clearly worsened over a longer period of time dating back to 2016. Findings are consistent with UIP per consensus guidelines: Diagnosis of Idiopathic Pulmonary Fibrosis: An Official ATS/ERS/JRS/ALAT Clinical Practice Guideline. [place], Iss 5, [phone], Jan 18 2017. 2. Status post anterior left upper lobe and posterior left lower lobe wedge resections. No evidence of malignant recurrence in the chest. 3. Coronary artery disease.   Aortic Atherosclerosis (ICD10-I70.0).     Electronically Signed   By: Delanna Ahmadi M.D.   On: 01/20/2022 21:21  No results found.    PFT     Latest Ref Rng & Units 03/18/2022    1:47 PM 02/15/2021    2:49 PM 08/05/2019   10:55 AM 03/12/2016   10:26 AM 11/14/2014   11:05 AM  PFT Results  FVC-Pre L 2.48  P 2.35  2.52  2.67  2.97   FVC-Predicted Pre % 77  P 71  74  76  84   FVC-Post L  2.41  2.52  2.61  3.00   FVC-Predicted Post %  72  74  75  85   Pre FEV1/FVC % % 81  P 78  81  78  78   Post FEV1/FCV % %  81  82  80  78   FEV1-Pre L 2.00  P 1.84  2.04  2.09  2.33   FEV1-Predicted Pre % 82  P 73  79  78  86   FEV1-Post L  1.96  2.06  2.09  2.33   DLCO uncorrected ml/min/mmHg 13.70  P 12.17  13.77  13.52  14.61   DLCO UNC% % 66  P 57  64  48  52   DLCO corrected ml/min/mmHg 13.70  P 12.17  13.77  14.73    DLCO COR %Predicted % 66  P 57  64  53    DLVA Predicted % 83  P 86  87  84  65   TLC L  3.83  4.22  4.18  4.88   TLC % Predicted %  70  77  77  89   RV % Predicted %  64  74  71  88     P Preliminary result       has a past medical history of Allergy, Asthma, Carotid artery disease ([place]) (2019), Chronic diastolic congestive heart failure (HCC), 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   reports that she has been smoking cigarettes. She started smoking about 38 years ago. She has a 24.00 pack-year smoking history. She has never used smokeless tobacco.  Past Surgical History:  Procedure Laterality Date   BACK SURGERY     x 3  Disectomy   BREAST BIOPSY Left    CARDIAC CATHETERIZATION N/A 03/08/2016   Procedure: Right/Left Heart Cath and Coronary Angiography;  Surgeon: Sherren Mocha, MD;  Location: [facility];  Service: Cardiovascular;  Laterality: N/A;   CLAVICLE SURGERY Left 2013   plate to left collar bone   ENDARTERECTOMY Right 08/09/2020   Procedure: RIGHT CAROTID ENDARTERECTOMY;  Surgeon: Waynetta Sandy, MD;  Location: [facility];  Service: Vascular;  Laterality: Right;   LAPAROSCOPY     ? reason-age 14    LEFT HEART CATH AND CORONARY ANGIOGRAPHY N/A 02/25/2019   Procedure: LEFT HEART CATH AND CORONARY ANGIOGRAPHY;  Surgeon: Jettie Booze, MD;  Location: [facility];  Service: Cardiovascular;  Laterality: N/A;   LEFT HEART CATH AND CORONARY ANGIOGRAPHY N/A 10/24/2020   Procedure: LEFT HEART CATH AND CORONARY ANGIOGRAPHY;  Surgeon: Jettie Booze, MD;  Location: [facility];  Service: Cardiovascular;  Laterality: N/A;   LUNG CANCER SURGERY     MITRAL VALVE REPAIR Right 06/27/2016   Procedure: MINIMALLY INVASIVE MITRAL VALVE REPLACEMENT;  Surgeon: Rexene Alberts, MD;  Location: [facility];  Service: Open Heart Surgery;  Laterality: Right;   TEE WITHOUT  CARDIOVERSION N/A 02/22/2016   Procedure: TRANSESOPHAGEAL ECHOCARDIOGRAM (TEE);  Surgeon: Dorothy Spark, MD;  Location: [facility];  Service: Cardiovascular;  Laterality: N/A;   TEE WITHOUT CARDIOVERSION N/A 06/27/2016   Procedure: TRANSESOPHAGEAL ECHOCARDIOGRAM (TEE);  Surgeon: Rexene Alberts, MD;  Location: [facility];  Service: Open Heart Surgery;  Laterality: N/A;   TONSILLECTOMY     VIDEO ASSISTED THORACOSCOPY (VATS)/WEDGE RESECTION Left 12/30/2014   Bronchoscopy, Mediastinoscopy, Left VATS for Wedge resection LUL x2 adn LLL x1 - Dr. Elenor Quinones at [facility] Left 2017   injected with botox   wrist surgery Left 2015   plate to wrist     Allergies  Allergen Reactions   Penicillins Other (See Comments)    Unknown, occurred as a child   Bee Venom Swelling    Severe swelling at the sting site.    Immunization History  Administered Date(s) Administered   Fluad Quad(high Dose 65+) 02/18/2019, 02/15/2021   H1N1 04/13/2008   Influenza Split 02/18/2015   Influenza, High Dose Seasonal PF 02/20/2017, 04/27/2018, 03/04/2022   Influenza,inj,Quad PF,6+ Mos 03/16/2015   Influenza-Unspecified 04/12/2008, 02/18/2011   Moderna Sars-Covid-2 Vaccination 06/21/2019, 07/18/2019, 01/25/2020   PFIZER(Purple Top)SARS-COV-2 Vaccination 03/04/2022   Pneumococcal Conjugate-13 01/22/2017   Tdap 09/26/2015   Zoster Recombinat (Shingrix) 05/10/2018, 03/03/2019    Family History  Problem Relation Age of Onset   Mitral valve prolapse Mother    Dementia Father    Prostate cancer Father    Mitral valve prolapse Sister    Mitral valve prolapse Brother    Healthy Son    Healthy Daughter    Colon cancer Neg Hx    Colon polyps Neg Hx    Rectal cancer Neg Hx    Esophageal cancer Neg Hx    Stomach cancer Neg Hx    Heart attack Neg Hx      Current Outpatient Medications:    acetaminophen (TYLENOL) 500 MG tablet, Take 1,000 mg by mouth every 6 (six) hours as needed for mild pain or  headache., Disp: , Rfl:    Cholecalciferol (VITAMIN D) 125 MCG (5000 UT) CAPS, Take 5,000 Units by mouth daily., Disp: , Rfl:    clopidogrel (PLAVIX) 75 MG tablet, Take 1 tablet (75 mg total) by mouth daily., Disp: 90 tablet, Rfl: 2   EPINEPHrine (EPIPEN 2-PAK) 0.3 mg/0.3 mL IJ SOAJ injection, Inject 0.3 mg into the muscle as needed for anaphylaxis., Disp: 1 each, Rfl: 1   ferrous sulfate 325 (65 FE) MG tablet, Take 1 tablet (325 mg total) by mouth daily., Disp: 90 tablet, Rfl: 3   Fluticasone-Umeclidin-Vilant (TRELEGY ELLIPTA) 100-62.5-25 MCG/ACT AEPB, INHALE 1 PUFF BY MOUTH EVERY DAY, Disp: 60 each, Rfl: 5   levothyroxine (SYNTHROID) 175 MCG tablet, TAKE 1 TABLET BY MOUTH EVERY DAY BEFORE BREAKFAST, Disp: 90 tablet, Rfl: 0   MELATONIN PO, Take 1 tablet by mouth at bedtime as needed (sleep)., Disp: , Rfl:    Multiple Vitamins-Minerals (MULTIVITAMIN WITH MINERALS) tablet, Take 1 tablet by mouth daily., Disp: , Rfl:    Nintedanib (OFEV) 100 MG CAPS, Take 1 capsule (100 mg total) by mouth 2 (two) times daily., Disp: 60 capsule, Rfl: 5   rosuvastatin (CRESTOR) 20 MG tablet, Take 1 tablet (20 mg total) by mouth every evening., Disp: 90 tablet, Rfl: 3   Turmeric 500 MG CAPS, Take 500 mg by mouth daily., Disp: , Rfl:    Varenicline Tartrate, Starter, (CHANTIX STARTING MONTH PAK) 0.5 MG X 11 & 1 MG X 42 TBPK, Days 1-3: 0.66m daily,days 4-7: 0.568mtwice daily,day 8 for next 11 weeks: 73m19mwice daily, Disp: 53 each, Rfl: 0   vitamin B-12 (CYANOCOBALAMIN) 1000 MCG tablet, Take 1 tablet (1,000 mcg total) by mouth daily., Disp: , Rfl:  Objective:   Vitals:   03/18/22 1448  BP: 118/62  Pulse: 74  Temp: 98 F (36.7 C)  TempSrc: Oral  SpO2: 97%  Weight: 130 lb 9.6 oz (59.2 kg)  Height: _0  (1.676 m)    Estimated body mass index is 21.08 kg/m as calculated from the following:   Height as of this encounter: _1  (1.676 m).   Weight as of this encounter: 130 lb 9.6 oz (59.2  kg).  _2 @  Filed Weights   03/18/22 1448  Weight: 130 lb 9.6 oz (59.2 kg)     Physical Exam    General: No distress. Looks well. LEAN.  Neuro: Alert and Oriented x 3. GCS 15. Speech normal Psych: Pleasant Resp:  Barrel Chest - no.  Wheeze - no, Crackles - YES BASE, No overt respiratory distress CVS: Normal heart sounds. 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 TELENGECTASIAS ON FACE HEENT: Normal upper airway. PEERL +. No post nasal drip        Assessment:       ICD-10-CM   1. ILD (interstitial lung disease) ([place])  J84.9 CBC with Differential/Platelet    Hepatic function panel    B Nat Peptide    Basic metabolic panel    Pulmonary function test    2. History of Raynaud's syndrome  Z86.79     3. ANA positive  R76.8     4. Hiatal hernia  K44.9     5. Encounter for medication counseling  Z71.89     6. History of smoking 25-50 pack years  Z87.891     7. History of lung cancer  Z85.118     8. Hyponatremia  E87.1     9. Shortness of breath  R06.02 B Nat Peptide         Plan:     Patient Instructions     ICD-10-CM   1. ILD (interstitial lung disease) ([place])  J84.9     2. History of Raynaud's syndrome  Z86.79     3. ANA positive  R76.8     4. Hiatal hernia  K44.9     5. Encounter for medication counseling  Z71.89     6. History of smoking 25-50 pack years  Z87.891     7. History of lung cancer  Z85.118     8. 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 -Continue acid reflux control as before -Do spirometry and DLCO in 12 weeks - 16 weeks - meet [facility] -to take consent form if you qualify for ILD-Pro registry  -Reschedule a separate visit if you qualify  #Hyponatremia September 2023   -Check chemistry panel today  - #SMoking  - glad you want to  quit and I understand you are using expired samples -  start chantix as directed  - Days 1-3: 0.5 mg once daily  - Days 4-7: 0.5 mg twice daily  - Maintenance (? Day 8):  1 mg twice daily for 11 weeks   - if you have bad dream stop medication and call us  - take the medicaiton as instructed and after food - quit smoking  1 week after starting chantix      Followup - 4 week video visti with app to discuss quit smoking  -12-16 weeks do spirometry and DLCO   -Return to see Dr. Chase Caller face-to-face in 8-12 weeks for 30-minute encounter  - symptom score and walk test at next visit  High complex medical condition requiring high risk prescription with intensive therapeutic monitoring requirement   SIGNATURE    Dr. Brand Males, M.D., F.C.C.P,  Pulmonary and Critical Care Medicine Staff Physician, [place] Director - Interstitial Lung Disease  Program  [facility] [place], [place], [address]  Pager: [phone], If no answer or between  15:00h - 7:00h: call [phone] Telephone: [phone]  3:52 PM 03/18/2022

## 2022-03-18 NOTE — Patient Instructions (Addendum)
ICD-10-CM   1. ILD (interstitial lung disease) ([place])  J84.9     2. History of Raynaud's syndrome  Z86.79     3. ANA positive  R76.8     4. Hiatal hernia  K44.9     5. Encounter for medication counseling  Z71.89     6. History of smoking 25-50 pack years  Z87.891     7. History of lung cancer  Z85.118     8. 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 -Continue acid reflux control as before -Do spirometry and DLCO in 12 weeks - 16 weeks - meet [facility] -to take consent form if you qualify for ILD-Pro registry  -Reschedule a separate visit if you qualify  #Hyponatremia September 2023   -Check chemistry panel today  - #SMoking  - glad you want to quit and I understand you are using expired samples -  start chantix as directed  - Days 1-3: 0.5 mg once daily  - Days 4-7: 0.5 mg twice daily  - Maintenance (? Day 8):  1 mg twice daily for 11 weeks   - if you have bad dream stop medication and call us  - take the medicaiton as instructed and after food - quit smoking  1 week after starting chantix      Followup - 4 week video visti with app to discuss quit smoking  -12-16 weeks do spirometry and DLCO   -Return to see Dr. Chase Caller face-to-face in 8-12 weeks for 30-minute encounter  - symptom score and walk test at next  visit

## 2022-03-18 NOTE — Progress Notes (Signed)
Spirometry and DLCO Performed Today.  

## 2022-03-19 ENCOUNTER — Telehealth: Payer: Self-pay | Admitting: Cardiology

## 2022-03-19 ENCOUNTER — Ambulatory Visit: Payer: Medicare Other | Attending: Cardiology | Admitting: Cardiology

## 2022-03-19 ENCOUNTER — Encounter: Payer: Self-pay | Admitting: Cardiology

## 2022-03-19 VITALS — BP 116/76 | HR 73 | Ht 66.0 in | Wt 131.6 lb

## 2022-03-19 DIAGNOSIS — E785 Hyperlipidemia, unspecified: Secondary | ICD-10-CM | POA: Insufficient documentation

## 2022-03-19 DIAGNOSIS — I10 Essential (primary) hypertension: Secondary | ICD-10-CM | POA: Insufficient documentation

## 2022-03-19 DIAGNOSIS — Z953 Presence of xenogenic heart valve: Secondary | ICD-10-CM | POA: Insufficient documentation

## 2022-03-19 DIAGNOSIS — I6523 Occlusion and stenosis of bilateral carotid arteries: Secondary | ICD-10-CM | POA: Diagnosis not present

## 2022-03-19 DIAGNOSIS — I5181 Takotsubo syndrome: Secondary | ICD-10-CM | POA: Diagnosis not present

## 2022-03-19 DIAGNOSIS — I5022 Chronic systolic (congestive) heart failure: Secondary | ICD-10-CM | POA: Diagnosis not present

## 2022-03-19 DIAGNOSIS — Z79899 Other long term (current) drug therapy: Secondary | ICD-10-CM | POA: Insufficient documentation

## 2022-03-19 DIAGNOSIS — I340 Nonrheumatic mitral (valve) insufficiency: Secondary | ICD-10-CM | POA: Insufficient documentation

## 2022-03-19 DIAGNOSIS — I2510 Atherosclerotic heart disease of native coronary artery without angina pectoris: Secondary | ICD-10-CM | POA: Insufficient documentation

## 2022-03-19 DIAGNOSIS — G459 Transient cerebral ischemic attack, unspecified: Secondary | ICD-10-CM | POA: Insufficient documentation

## 2022-03-19 LAB — CBC WITH DIFFERENTIAL/PLATELET
Basophils Absolute: 0 10*3/uL (ref 0.0–0.1)
Basophils Relative: 0.3 % (ref 0.0–3.0)
Eosinophils Absolute: 0.1 10*3/uL (ref 0.0–0.7)
Eosinophils Relative: 0.7 % (ref 0.0–5.0)
HCT: 39.3 % (ref 36.0–46.0)
Hemoglobin: 13.1 g/dL (ref 12.0–15.0)
Lymphocytes Relative: 14.7 % (ref 12.0–46.0)
Lymphs Abs: 1.4 10*3/uL (ref 0.7–4.0)
MCHC: 33.4 g/dL (ref 30.0–36.0)
MCV: 90.8 fl (ref 78.0–100.0)
Monocytes Absolute: 0.4 10*3/uL (ref 0.1–1.0)
Monocytes Relative: 4.1 % (ref 3.0–12.0)
Neutro Abs: 7.6 10*3/uL (ref 1.4–7.7)
Neutrophils Relative %: 80.2 % — ABNORMAL HIGH (ref 43.0–77.0)
Platelets: 219 10*3/uL (ref 150.0–400.0)
RBC: 4.33 Mil/uL (ref 3.87–5.11)
RDW: 13.5 % (ref 11.5–15.5)
WBC: 9.5 10*3/uL (ref 4.0–10.5)

## 2022-03-19 LAB — BASIC METABOLIC PANEL
BUN: 15 mg/dL (ref 6–23)
CO2: 28 mEq/L (ref 19–32)
Calcium: 9.5 mg/dL (ref 8.4–10.5)
Chloride: 96 mEq/L (ref 96–112)
Creatinine, Ser: 0.6 mg/dL (ref 0.40–1.20)
GFR: 90.51 mL/min (ref >60.00)
Glucose, Bld: 71 mg/dL (ref 70–99)
Potassium: 4.2 mEq/L (ref 3.5–5.1)
Sodium: 132 mEq/L — ABNORMAL LOW (ref 135–145)

## 2022-03-19 LAB — HEPATIC FUNCTION PANEL
ALT: 27 U/L (ref 0–35)
AST: 37 U/L (ref 0–37)
Albumin: 4.5 g/dL (ref 3.5–5.2)
Alkaline Phosphatase: 48 U/L (ref 39–117)
Bilirubin, Direct: 0 mg/dL (ref 0.0–0.3)
Total Bilirubin: 0.5 mg/dL (ref 0.2–1.2)
Total Protein: 7.5 g/dL (ref 6.0–8.3)

## 2022-03-19 LAB — BRAIN NATRIURETIC PEPTIDE: Pro B Natriuretic peptide (BNP): 109 pg/mL — ABNORMAL HIGH (ref 0.0–100.0)

## 2022-03-19 MED ORDER — CLARITHROMYCIN 500 MG PO TABS: ORAL_TABLET | ORAL | 6 refills | Status: DC

## 2022-03-19 MED ORDER — HYDRALAZINE HCL 25 MG PO TABS: 25.0000 mg | ORAL_TABLET | Freq: Every day | ORAL | 3 refills | Status: DC | PRN

## 2022-03-19 NOTE — Telephone Encounter (Signed)
Pt has an appt today at 2:00pm. Pt wanted to discuss pre medication needed for dental cleaning this week. She states her dentist usually prescribes it for her but she has a new dentist and they wont prescribe her any because she's new there.

## 2022-03-19 NOTE — Telephone Encounter (Signed)
SBE prophylaxis provided to the pt today at her OV.   We sent in clarithromycin 500 mg take one tablet by mouth 1 hour prior to dental cleaning/procedure.   Rx sent to the pts pharmacy on file and she was aware of this.  Pt verbalized understanding and agrees with this plan.

## 2022-03-19 NOTE — Progress Notes (Signed)
Cardiology Office Note:    Date:  03/19/2022   ID:  Joy Williams, DOB May 22, 1950, MRN [id number]  PCP:  Lorrene Reid, PA-C   [facility] Providers Cardiologist:  Freada Bergeron, MD {   Referring MD: Lorrene Reid, PA-C    History of Present Illness:    Joy Williams is a 71 y.o. female with a hx of MVP s/p minimally invasive bioprosthetic MVR, carotid artery disease s/p R CEA, scleroderma, cold agglutinin disease, raynaud's disease, UIP, NSCLC, COPD, hypothyroidism and recent admission for STEMI found to have clean coronaries on cath with diagnosis of Takostubo CM discharged on 10/29/20 and recent readmission for dizziness on 11/03/20 who now presents to clinic for follow-up.  Patient was admitted 02/24/19-02/25/19 with chest discomfort.  High-sensitivity troponin levels were elevated (1471 >> 1625 >> 1246 >> 1150).  There was concern for NSTEMI., however, cath demonstrated no CAD.  TTE demonstrated normal LV function and normally functioning mitral valve prosthesis.  She was followed by Dr. Debara Pickett in the hospital and he felt that her presentation was c/w [facility] (myocardial infarction with non-obstructive coronary arteries) or non-ACS troponin elevation.  The possible causes included myocarditis, coronary spasm, cardioembolic event. Underwent cardiac MRI that showed focal subendocardial LGE into mid inferolateral wall consistent with infarct seen on territory with concern for a thromboembolic event given clean cath. She was managed medically and worked with cardiac rehab at that time.   In November of 2020, she developed palpitations. 4 week zio placed with brief runs of SVT but no significant arrhythmias or Afib   Re-admitted to [facility] from 08/06/20-08/10/20 where she presented for acute onset dizziness, left arm incoordination and HA. CT head negative for acute abnormalities. MRI brain without acute infarct but showed multiple scattered remote lacunar infarcts. 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 TTE with EF 65 to 70%, NWMA, mild LVH, moderate left atrial size dilated, mild MVR, no source of emboli noted. She underwent right CEA on 08/09/20 with [name]. She was continued on ASA, plavix and statin.  Re-admitted again from 10/24/20-10/29/20 where she presented with chest pain and nausea found to have STE in I and aVL for which a STEMI was called. She was taken to the cath lab which showed 25% pLAD stenosis, otherwise normal coronary arteries, and EF 25-35% with LV gram c/w takotsubo cardiomyopathy. She was started on plavix. TTE showed EF 25-30%, global hypokinesis, G1DD, moderately elevated PA pressures, moderate LAE, and normal structure/function of mitral valve s/p repair/replacement. GDMT was limited due to hypotension and she was discharged on 10/29/20.  Re-presented to the ER on 11/03/20 with dizziness and orthostatic symptoms. Trop 41-->44 which was down from prior admission. Symptoms thought to be due to orthostasis and farxiga discontinued.  Saw [facility] on 11/06/20 where she was doing better. Dizziness improved. Bedside echo with improved LVEF.  She was seen 11/15/2020 where she was feeling much improved since last admission.  Repeat echo 11/22/2020 with LVEF returned to normal 60 to 65%, mild LVH, trivial central MR with no stenosis.  Last seen in clinic on 06/2021 where she was doing well from a CV standpoint. TTE 12/04/21 with LVEF 60-65%, Normal RV, moderate LAE, normal functioning MV prosthesis with mean gradient 2.70mmHG at HR 63bpm.  Has been followed by Pulmonary (Dr. 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 She has been having episodes of high blood pressure around once a month which have since  resolved. Her worst episode of high blood pressure was up to 166/80 which occurred after a high sodium meal the night before. She felt lightheaded which didn't improve for almost two hours. Her blood pressure today in clinic is better at 116/76.   She has very [employer] and enjoys eating a lot of different foods which are often high in sodium. She believes this drives up her blood pressure.  She has lost weight recently due to diarrhea resulting from Ofev medication.  Otherwise, she is doing well from a CV standpoint. She denies any palpitations, chest pain, shortness of breath, or peripheral edema. No headaches, syncope, orthopnea, or PND.  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, each treated by wedge resection with post-op adjuvant chemoRx at [facility]   MVP (mitral valve prolapse) 2018   s/p Minimally-Invasive Mitral Valve Replacement w/  [facility] Mitral bovine bioprosthetic tissue valve (size 29mm, model # 7300TFX, serial # [id number])   PONV (postoperative nausea and vomiting)    Raynaud's syndrome    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   31 mm [facility] mitral bovine bioprosthetic tissue valve placed via right mini thoracotomy approach   Severe mitral regurgitation 11/15/2014   Shortness of breath dyspnea    with exertion    STD (sexually transmitted disease)    STEMI (ST elevation myocardial infarction) ([place]) 10/24/2020   Telangiectasia     Past Surgical History:  Procedure Laterality Date   BACK SURGERY     x 3  Disectomy   BREAST BIOPSY Left    CARDIAC CATHETERIZATION N/A 03/08/2016   Procedure: Right/Left Heart Cath and Coronary Angiography;  Surgeon: Sherren Mocha, MD;  Location: [place] [facility];  Service: Cardiovascular;  Laterality: N/A;   CLAVICLE SURGERY Left 2013   plate to left collar bone   ENDARTERECTOMY Right 08/09/2020   Procedure: RIGHT CAROTID ENDARTERECTOMY;  Surgeon: Waynetta Sandy, MD;  Location: [place];  Service: Vascular;  Laterality: Right;   LAPAROSCOPY     ? reason-age 24    LEFT HEART CATH AND CORONARY ANGIOGRAPHY N/A 02/25/2019   Procedure: LEFT HEART CATH AND CORONARY ANGIOGRAPHY;  Surgeon: Jettie Booze, MD;  Location: [place] [facility];  Service: Cardiovascular;  Laterality: N/A;   LEFT HEART CATH AND CORONARY ANGIOGRAPHY N/A 10/24/2020   Procedure: LEFT HEART CATH AND CORONARY ANGIOGRAPHY;  Surgeon: Jettie Booze, MD;  Location: [place] [facility];  Service: Cardiovascular;  Laterality: N/A;   LUNG CANCER SURGERY     MITRAL  VALVE REPAIR Right 06/27/2016   Procedure: MINIMALLY INVASIVE MITRAL VALVE REPLACEMENT;  Surgeon: Rexene Alberts, MD;  Location: [place];  Service: Open Heart Surgery;  Laterality: Right;   TEE WITHOUT CARDIOVERSION N/A 02/22/2016   Procedure: TRANSESOPHAGEAL ECHOCARDIOGRAM (TEE);  Surgeon: Dorothy Spark, MD;  Location: [place];  Service: Cardiovascular;  Laterality: N/A;   TEE WITHOUT CARDIOVERSION N/A 06/27/2016   Procedure: TRANSESOPHAGEAL ECHOCARDIOGRAM (TEE);  Surgeon: Rexene Alberts, MD;  Location: [place];  Service: Open Heart Surgery;  Laterality: N/A;   TONSILLECTOMY     VIDEO ASSISTED THORACOSCOPY (VATS)/WEDGE RESECTION Left 12/30/2014   Bronchoscopy, Mediastinoscopy, Left VATS for Wedge resection LUL x2 adn LLL x1 - Dr.  Elenor Quinones at [place] Left 2017   injected with botox   wrist surgery Left 2015   plate to wrist     Current Medications: Current Meds  Medication Sig   acetaminophen (TYLENOL) 500 MG tablet Take 1,000 mg by mouth every 6 (six) hours as needed for mild pain or headache.   Cholecalciferol (VITAMIN D) 125 MCG (5000 UT) CAPS Take 5,000 Units by mouth daily.   clarithromycin (BIAXIN) 500 MG tablet Take 1 tablet (500 mg total) orally one hour before dental cleaning/dental procedure.   clopidogrel (PLAVIX) 75 MG tablet Take 1 tablet (75 mg total) by mouth daily.   EPINEPHrine (EPIPEN 2-PAK) 0.3 mg/0.3 mL IJ SOAJ injection Inject 0.3 mg into the muscle as needed for anaphylaxis.   ferrous sulfate 325 (65 FE) MG tablet Take 1 tablet (325 mg total) by mouth daily.   Fluticasone-Umeclidin-Vilant (TRELEGY ELLIPTA) 100-62.5-25 MCG/ACT AEPB INHALE 1 PUFF BY MOUTH EVERY DAY   hydrALAZINE (APRESOLINE) 25 MG tablet Take 1 tablet (25 mg total) by mouth daily as needed (for systolic ZO>109).   levothyroxine (SYNTHROID) 175 MCG tablet TAKE 1 TABLET BY MOUTH EVERY DAY BEFORE BREAKFAST   MELATONIN PO Take 1 tablet by mouth at bedtime as needed (sleep).   Multiple Vitamins-Minerals (MULTIVITAMIN WITH MINERALS) tablet Take 1 tablet by mouth daily.   Nintedanib (OFEV) 100 MG CAPS Take 1 capsule (100 mg total) by mouth 2 (two) times daily.   rosuvastatin (CRESTOR) 20 MG tablet Take 1 tablet (20 mg total) by mouth every evening.   Turmeric 500 MG CAPS Take 500 mg by mouth daily.   Varenicline Tartrate, Starter, (CHANTIX STARTING MONTH PAK) 0.5 MG X 11 & 1 MG X 42 TBPK Days 1-3: 0.5mg  daily,days 4-7: 0.5mg  twice daily,day 8 for next 11 weeks: 1mg  twice daily   vitamin B-12 (CYANOCOBALAMIN) 1000 MCG tablet Take 1 tablet (1,000 mcg total) by mouth daily.     Allergies:   Penicillins and Bee venom   Social History   Socioeconomic History   Marital status: Divorced    Spouse name: Not on file    Number of children: Not on file   Years of education: 16   Highest education level: Not on file  Occupational History   Occupation: Retired  Tobacco Use   Smoking status: Some Days    Packs/day: 1.00    Years: 24.00    Total pack years: 24.00    Types: Cigarettes    Start date: 05/21/1983   Smokeless tobacco: Never   Tobacco comments:    Currently smoking 5 cigs a day as of 03/18/22 ep  Vaping Use   Vaping Use: Never used  Substance and Sexual Activity   Alcohol use: Yes    Alcohol/week: 2.0 standard drinks of  alcohol    Types: 1 Shots of liquor, 1 Standard drinks or equivalent per week    Comment: occ   Drug use: No   Sexual activity: Not Currently    Partners: Male    Birth control/protection: Post-menopausal  Other Topics Concern   Not on file  Social History Narrative   Not on file   Social Determinants of Health   Financial Resource Strain: Low Risk  (10/26/2020)   Overall Financial Resource Strain (CARDIA)    Difficulty of Paying Living Expenses: Not very hard  Food Insecurity: No Food Insecurity (10/26/2020)   Hunger Vital Sign    Worried About Running Out of Food in the Last Year: Never true    Ran Out of Food in the Last Year: Never true  Transportation Needs: Unmet Transportation Needs (10/26/2020)   PRAPARE - Hydrologist (Medical): No    Lack of Transportation (Non-Medical): Yes  Physical Activity: Not on file  Stress: Not on file  Social Connections: Not on file     Family History: The patient's family history includes Dementia in her father; Healthy in her daughter and son; Mitral valve prolapse in her brother, mother, and sister; Prostate cancer in her father. There is no history of Colon cancer, Colon polyps, Rectal cancer, Esophageal cancer, Stomach cancer, or Heart attack.  ROS:   Please see the history of present illness.    Review of Systems  Constitutional:  Positive for weight loss. Negative for chills and fever.  HENT:   Negative for hearing loss.   Eyes:  Negative for blurred vision.  Respiratory:  Negative for shortness of breath.   Cardiovascular:  Negative for chest pain, palpitations, orthopnea, claudication, leg swelling and PND.  Gastrointestinal:  Positive for diarrhea. Negative for blood in stool, nausea and vomiting.  Genitourinary:  Negative for dysuria and hematuria.  Musculoskeletal:  Negative for falls.  Neurological:  Negative for loss of consciousness.       (+) Lightheadedness  Endo/Heme/Allergies:  Bruises/bleeds easily.  Psychiatric/Behavioral:  Negative for substance abuse.      EKGs/Labs/Other Studies Reviewed:    The following studies were reviewed today: Echo 11/22/20  1. Left ventricular ejection fraction, by estimation, is 60 to 65%. Left  ventricular ejection fraction by 3D volume is 62 %. The left ventricle has  normal function. There is mild concentric left ventricular hypertrophy.  Left ventricular diastolic  function could not be evaluated.   2. Right ventricular systolic function is normal. The right ventricular  size is normal. There is normal pulmonary artery systolic pressure. The  estimated right ventricular systolic pressure is 13.0 mmHg.   3. Left atrial size was mild to moderately dilated.   4. 31 mm Magna Ease in the mitral position 06/27/2016. MG 2 mmHG @ 65 bpm.  EOA 2.27 cm2. Trivial central MR. No stenosis. Normal prosthesis. Trivial  mitral valve regurgitation. The mean mitral valve gradient is 2.0 mmHg  with average heart rate of 65 bpm.  There is a 31 mm bioprosthetic valve present in the mitral position.  Procedure Date: 06/27/16.   5. The aortic valve is tricuspid. Aortic valve regurgitation is not  visualized. No aortic stenosis is present.   6. The inferior vena cava is normal in size with greater than 50%  respiratory variability, suggesting right atrial pressure of 3 mmHg.   Comparison(s): Changes from prior study are noted. EF is now 60-65%. WMA   have resolved.  LHC 10/24/20: Prox  LAD lesion is 25% stenosed. No significant CAD. The left ventricular systolic function is normal. LV end diastolic pressure is mildly elevated. The left ventricular ejection fraction is 25-35% by visual estimate. There is no aortic valve stenosis. Prior mitral valve surgery.   Despite lateral ST elevation, we only found nonobstructive coronary artery disease.  Left ventriculogram fits with a pattern of Takotsubo cardiomyopathy.  LV function significantly different from her prior catheterization.  She will need aggressive medical therapy for LV dysfunction.   I discussed the findings with her daughter.    10/18/2020: IMPRESSION: 1. There is mild pulmonary fibrosis in a pattern with apical to basal gradient, featuring irregular peripheral interstitial opacity, septal thickening, traction bronchiectasis, and subpleural bronchiolectasis at the lung basis without clear evidence of honeycombing. Fibrotic findings are worsened over time on prior examinations dating back to 11/16/2014. Findings are categorized as probable UIP per consensus guidelines: Diagnosis of Idiopathic Pulmonary Fibrosis: An Official ATS/ERS/JRS/ALAT Clinical Practice Guideline. [place], Iss 5, [id number], Jan 18 2017. 2. Redemonstrated postoperative findings of anterior left upper lobe and dependent left lower lobe wedge resections. No evidence of malignant recurrence in the chest. 3. Coronary artery disease.   Aortic Atherosclerosis (ICD10-I70.0).   Echocardiogram 10/25/20: 1. Left ventricular ejection fraction, by estimation, is 25 to 30%. The  left ventricle has severely decreased function. The left ventricle  demonstrates global hypokinesis. Left ventricular diastolic parameters are  consistent with Grade I diastolic  dysfunction (impaired relaxation).   2. Right ventricular systolic function is normal. The right ventricular  size is normal. There is  moderately elevated pulmonary artery systolic  pressure. The estimated right ventricular systolic pressure is 60.6 mmHg.   3. Left atrial size was moderately dilated.   4. The mitral valve has been repaired/replaced. No evidence of mitral  valve regurgitation. No evidence of mitral stenosis. Severe mitral annular  calcification. Echo findings are consistent with normal structure and  function of the mitral valve  prosthesis.   5. The aortic valve is normal in structure. Aortic valve regurgitation is  not visualized. No aortic stenosis is present.   6. The inferior vena cava is normal in size with greater than 50%  respiratory variability, suggesting right atrial pressure of 3 mmHg.   Comparison(s): Prior images unable to be directly viewed, comparison made  by report only. The left ventricular function is worsened.  EKG:  The EKG is personally reviewed 03/19/22: NSR 73bpm  Recent Labs: 03/18/2022: ALT 27; BUN 15; Creatinine, Ser 0.60; Hemoglobin 13.1; Platelets 219.0; Potassium 4.2; Pro B Natriuretic peptide (BNP) 109.0; Sodium 132  Recent Lipid Panel    Component Value Date/Time   CHOL 135 10/24/2020 1545   CHOL 150 11/11/2018 0946   CHOL 177 10/28/2014 1025   TRIG 100 10/24/2020 1545   TRIG 115 10/28/2014 1025   HDL 60 10/24/2020 1545   HDL 70 11/11/2018 0946   HDL 70 10/28/2014 1025   CHOLHDL 2.3 10/24/2020 1545   VLDL 20 10/24/2020 1545   LDLCALC 55 10/24/2020 1545   LDLCALC 64 11/11/2018 0946   LDLCALC 84 10/28/2014 1025   LDLDIRECT 63 06/15/2020 1446         Physical Exam:    VS:  BP 116/76   Pulse 73   Ht 5\' 6"  (1.676 m)   Wt 131 lb 9.6 oz (59.7 kg)   BMI 21.24 kg/m     Wt Readings from Last 3 Encounters:  03/19/22 131 lb 9.6  oz (59.7 kg)  03/18/22 130 lb 9.6 oz (59.2 kg)  10/16/21 138 lb (62.6 kg)     GEN: Comfortable, NAD HEENT: Normal NECK: No JVD; No carotid bruits CARDIAC: RRR,  soft systolic murmur. No rubs or gallops RESPIRATORY:   Coarse  crackles at the bases (unchanged). Otherwise clear. ABDOMEN: Soft, non-tender, non-distended MUSCULOSKELETAL:  No edema; No deformity  SKIN: Warm and dry NEUROLOGIC:  Alert and oriented x 3 PSYCHIATRIC:  Normal affect   ASSESSMENT:    1. Chronic systolic heart failure ([place])   2. Medication management   3. Mitral valve insufficiency, unspecified etiology   4. S/P mitral valve replacement with bioprosthetic valve   5. Hyperlipidemia LDL goal <70   6. Nonobstructive atherosclerosis of coronary artery   7. Takotsubo cardiomyopathy   8. TIA (transient ischemic attack)   9. Bilateral carotid artery stenosis   10. Orthostatic hypertension    PLAN:    In order of problems listed above:  #Non-Ischemic CM:  #Takotsubo cardiomyopathy:  Patient had a recent admission from 10/24/20-10/29/20 where she presented with chest pain, nausea, and vomiting and found to have anterolateral STE on EKG. HsTrop increased from 916>4990. She underwent emergent cardiac catheterization which showed 25% pLAD stenosis, otherwise normal coronary arteries, and EF 25-35% with LV gram c/w takotsubo cardiomyopathy. She was started on plavix for conservative management. TTE showed EF 25-30%, global hypokinesis, G1DD, moderately elevated PA pressures, moderate LAE, and normal structure/function of mitral valve s/p repair/replacement. GDMT has been limited by symptomatic hypotension. Repeat echo 11/22/2020 with LVEF returned to normal 60 to 65%, mild LVH, trivial central MR with no stenosis. -TTE with improved EF 60-65% - Unable to tolerate GDMT due to symptomatic hypotension - Continue plavix 75mg  daily (was on previously due to suspected thromboembolic NSTEMI in the past) - Monitor daily weights  #MVP c/b severe mitral regurgitation s/p bioprosthetic MVR in 2018:  Stable gradients on most recent TTE - Continue routine monitoring   #History of NSTEMI (non-ST elevated myocardial infarction)  Presented with chest pain in  2020. Cath without obstructive disease, however, MRI showed focal LGE in the mid inferolateral wall highly suspicious for thromboembolic event. TTE with LVEF was 60 to 65%. Completed cardiac rehab. Had mild troponin elevation with recent hospitalization with peak 140-->130. No anginal symptoms. Suspect demand in the setting of TIA. Given recent negative cath and absence of chest pain, no further work-up needed at this time. -Continue plavix 75mg  daily (favored over ASA given concern for TIA at that time) -Continue crestor 20mg  daily -Off BB due to orthostatsis -Stopped linisopril due to orthostasis  #Episodic HTN: Likely driven by increased salt intake. Due to orthostatic hypotension, she was unable to tolerate BP meds. Will give prn hydralazine as needed. Discussed low Na diet.  -Start hydralazine 25mg  prn for SBP>150 -Discussed low Na diet at length -Cannot tolerate scheduled HTN med due to orthostasis.  #Orthostatic Hypotension: Improved with cessation of antihypertensives and jardiance. -Compression socks -Elevate legs at end of the day -Slow position changes   #Carotid artery disease s/p right CEA 07/2020: followed by [name] outpatient - Continue routine monitoring - Continue plavix 75mg  daily and crestor 20mg  daily as above   #Iron deficiency:  - Continue po iron - Follow-up with PCP for further management.     #UIP:  - Continue home trelegy - Continue to follow with pulmonology   #Hyperlipidemia LDL goal <70 LDL optimal on most recent lab work with LDL 63   -Change to  crestor 20mg  daily as above  #Scleroderma with CREST: #Pulmonary Fibrosis: Most recent TTE with normal RV. Unable to assess PASP due to inadequate TR jet. -Follows with Pulm and Rheum -Continue yearly monitoring of TTEs for RV and PASP  #Tobacco Abuse: Quit successfully. -Continue nicotine patches  Follow Up: 6 Months.  Medication Adjustments/Labs and Tests Ordered: Current medicines are reviewed  at length with the patient today.  Concerns regarding medicines are outlined above.  Orders Placed This Encounter  Procedures   Lipid Profile   EKG 12-Lead   Meds ordered this encounter  Medications   clarithromycin (BIAXIN) 500 MG tablet    Sig: Take 1 tablet (500 mg total) orally one hour before dental cleaning/dental procedure.    Dispense:  6 tablet    Refill:  6    SBE prophylaxis   hydrALAZINE (APRESOLINE) 25 MG tablet    Sig: Take 1 tablet (25 mg total) by mouth daily as needed (for systolic PP>295).    Dispense:  30 tablet    Refill:  3   Patient Instructions  Medication Instructions:   START  prophylactic antibiotic before dental work and other surgeries. Prescription given for Clarithromycin 500 mg orally one hour before procedure.  START TAKING HYDRALAZINE 25 MG BY MOUTH DAILY AS NEEDED FOR SYSTOLIC (TOP NUMBER) BP OF 150 OR GREATER  *If you need a refill on your cardiac medications before your next appointment, please call your pharmacy*   Lab Work:  NEXT MONDAY 03/25/22 HERE IN THE OFFICE--CHECK LIPIDS AT THAT TIME--PLEASE COME FASTING  If you have labs (blood work) drawn today and your tests are completely normal, you will receive your results only by: [place] (if you have MyChart) OR A paper copy in the mail If you have any lab test that is abnormal or we need to change your treatment, we will call you to review the results.   Follow-Up: At [facility], you and your health needs are our priority.  As part of our continuing mission to provide you with exceptional heart care, we have created designated Provider Care Teams.  These Care Teams include your primary Cardiologist (physician) and Advanced Practice Providers (APPs -  Physician Assistants and Nurse Practitioners) who all work together to provide you with the care you need, when you need it.  We recommend signing up for the patient portal called "MyChart".  Sign up information is provided  on this After Visit Summary.  MyChart is used to connect with patients for Virtual Visits (Telemedicine).  Patients are able to view lab/test results, encounter notes, upcoming appointments, etc.  Non-urgent messages can be sent to your provider as well.   To learn more about what you can do with MyChart, go to [email or web address].    Your next appointment:   6 month(s)  The format for your next appointment:   In Person  Provider:   Freada Bergeron, MD      Important Information About Sugar            I,Coren O'Brien,acting as a scribe for Freada Bergeron, MD.,have documented all relevant documentation on the behalf of Freada Bergeron, MD,as directed by  Freada Bergeron, MD while in the presence of Freada Bergeron, MD.  I, Freada Bergeron, MD, have reviewed all documentation for this visit. The documentation on 03/19/22 for the exam, diagnosis, procedures, and orders are all accurate and complete.   Signed, Freada Bergeron, MD  03/19/2022 2:48 PM

## 2022-03-19 NOTE — Patient Instructions (Addendum)
Medication Instructions:   START  prophylactic antibiotic before dental work and other surgeries. Prescription given for Clarithromycin 500 mg orally one hour before procedure.  START TAKING HYDRALAZINE 25 MG BY MOUTH DAILY AS NEEDED FOR SYSTOLIC (TOP NUMBER) BP OF 150 OR GREATER  *If you need a refill on your cardiac medications before your next appointment, please call your pharmacy*   Lab Work:  NEXT MONDAY 03/25/22 HERE IN THE OFFICE--CHECK LIPIDS AT THAT TIME--PLEASE COME FASTING  If you have labs (blood work) drawn today and your tests are completely normal, you will receive your results only by: [place] (if you have MyChart) OR A paper copy in the mail If you have any lab test that is abnormal or we need to change your treatment, we will call you to review the results.   Follow-Up: At [facility], you and your health needs are our priority.  As part of our continuing mission to provide you with exceptional heart care, we have created designated Provider Care Teams.  These Care Teams include your primary Cardiologist (physician) and Advanced Practice Providers (APPs -  Physician Assistants and Nurse Practitioners) who all work together to provide you with the care you need, when you need it.  We recommend signing up for the patient portal called "MyChart".  Sign up information is provided on this After Visit Summary.  MyChart is used to connect with patients for Virtual Visits (Telemedicine).  Patients are able to view lab/test results, encounter notes, upcoming appointments, etc.  Non-urgent messages can be sent to your provider as well.   To learn more about what you can do with MyChart, go to [email or web address].    Your next appointment:   6 month(s)  The format for your next appointment:   In Person  Provider:   Freada Bergeron, MD      Important Information About Sugar

## 2022-03-19 NOTE — Telephone Encounter (Signed)
We will send in SBE prophylaxis to the pts pharmacy, when we see her later today in clinic at 2 pm.    Pt will be getting dental work done soon, and will require SBE prophylaxis prior to that appt, for history of s/p bioprosthetic MVR.

## 2022-03-22 ENCOUNTER — Other Ambulatory Visit: Payer: Self-pay | Admitting: Internal Medicine

## 2022-03-25 ENCOUNTER — Other Ambulatory Visit: Payer: Medicare Other

## 2022-04-04 ENCOUNTER — Other Ambulatory Visit: Payer: Self-pay | Admitting: Physician Assistant

## 2022-04-04 DIAGNOSIS — E039 Hypothyroidism, unspecified: Secondary | ICD-10-CM

## 2022-04-04 NOTE — Telephone Encounter (Signed)
Last OV 10/

## 2022-04-04 NOTE — Telephone Encounter (Signed)
Last OV 03/06/21. Needs appointment

## 2022-04-15 ENCOUNTER — Ambulatory Visit: Payer: Medicare Other | Admitting: Adult Health

## 2022-04-28 ENCOUNTER — Encounter: Payer: Self-pay | Admitting: Internal Medicine

## 2022-04-29 NOTE — Telephone Encounter (Signed)
Mychart message sent by pt: [facility] (supporting Brand Males, MD)Yesterday (7:41 AM)    Could I get Devki's assistance in completing paperwork for [place] Patient Assistance Program for the [place] prescription please.      Routing to pharmacy team.

## 2022-05-01 ENCOUNTER — Telehealth: Payer: Self-pay | Admitting: Internal Medicine

## 2022-05-02 NOTE — Telephone Encounter (Signed)
Received signed [employer] re-enrollment forms from patient. She requested copy, so made copies and mailed back to her today (without completed prescribe form as this is a formal rx).  Placed patient's portion with income doc in PAP pending info folder in pharmacy office.  Provider portion placed in Dr. Golden Pop mailbox for signature  Knox Saliva, PharmD, MPH, BCPS, CPP Clinical Pharmacist (Rheumatology and Pulmonology)

## 2022-05-06 ENCOUNTER — Encounter: Payer: Medicare Other | Admitting: Internal Medicine

## 2022-05-06 DIAGNOSIS — J849 Interstitial pulmonary disease, unspecified: Secondary | ICD-10-CM

## 2022-05-06 DIAGNOSIS — Z006 Encounter for examination for normal comparison and control in clinical research program: Secondary | ICD-10-CM

## 2022-05-06 NOTE — Telephone Encounter (Signed)
Submitted Patient Assistance Application to [facility] for [place] along with provider portion, PA and income documents. Will update patient when we receive a response.  Fax# [phone] Phone# [phone]

## 2022-05-09 NOTE — Research (Signed)
Title: Chronic Fibrosing Interstitial Lung Disease with Progressive Phenotype Prospective Outcomes (ILD-PRO) Registry    Protocol #: IPF-PRO-SUB, Clinical Trials # [id number], Sponsor: [employer]   Protocol Version Amendment 4 dated 12Sep2019  and confirmed current on  Consent Version for today's visit date of  Is [place] IRB Approved Version 23 Apr 2018 Revised 23 Apr 2018   Objectives:  Describe current approaches to diagnosis and treatment of chronic fibrosing ILDs with progressive phenotype  Describe the natural history of chronic fibrosing ILDs with progressive phenotype  Assess quality of life from self-administered participant reported questionnaires for each disease group  Describe participant interactions with the healthcare system, describe treatment practices across multiple institutions for each disease group  Collect biological samples linked to well characterized chronic fibrosing ILDs with progressive phenotype to identify disease biomarkers  Collect data and biological samples that will support future research studies.                                            Key Inclusion Criteria: Willing and able to provide informed consent  Age ? 30 years  Diagnosis of a non-IPF ILD of any duration, including, but not limited to Idiopathic Non-Specific Interstitial Pneumonia (INSIP), Unclassifiable Idiopathic Interstitial Pneumonias (IIPs), Interstitial Pneumonia with Autoimmune Features (IPAF), Autoimmune ILDs such as Rheumatoid Arthritis (RA-ILD) and Systemic Sclerosis (SSC-ILD), Chronic Hypersensitivity Pneumonitis (HP), Sarcoidosis or Exposure-related ILDs such as asbestosis.  Chronic fibrosing ILD defined by reticular abnormality with traction bronchiectasis with or without honeycombing confirmed by chest HRCT scan and/or lung biopsy.  Progressive phenotype as defined by fulfilling at least one of the criteria below of fibrotic changes (progression set point)  within the last 24 months regardless of treatment considered appropriate in individual ILDs:  decline in FVC % predicted (% pred) based on >10% relative decline  decline in FVC % pred based on ? 5 - <10% relative decline in FVC combined with worsening of respiratory symptoms as assessed by the site investigator  decline in FVC % pred based on ? 5 - <10% relative decline in FVC combined with increasing extent of fibrotic changes on chest imaging (HRCT scan) as assessed by the site investigator  decline in DLCO % pred based on ? 10% relative decline  worsening of respiratory symptoms as well as increasing extent of fibrotic changes on chest imaging (HRCT scan) as assessed by the site investigator independent of FVC change.     Key Exclusion Criteria: Malignancy, treated or untreated, other than skin or early stage prostate cancer, within the past 5 years  Currently listed for lung transplantation at the time of enrollment  Currently enrolled in a clinical trial at the time of enrollment in this registry       LATE ENTRY: Tourist information centre manager / Research RN note : This visit for Joy Williams  Subject [id number] with DOB: [birth year] on 18/Dec/2023  for the above protocol is Visit/Encounter Baseline/Enrollment and is for purpose of research.    Subject expressed continued interest and consent in continuing as a study subject. Subject confirmed that there was no change in contact information (e.g. address, telephone, email). Subject thanked for participation in research and contribution to science.     During this visit on 18/Dec/2023  , the subject completed the blood work and questionnaires per the above referenced protocol. Please refer to the subject's paper source binder  for further details.   Signed by Loma Newton Research Assistant [facility]  [place], [place] 21/Dec/2023  1:38pm

## 2022-05-15 ENCOUNTER — Other Ambulatory Visit: Payer: Self-pay | Admitting: Nurse Practitioner

## 2022-05-15 DIAGNOSIS — E785 Hyperlipidemia, unspecified: Secondary | ICD-10-CM

## 2022-05-15 DIAGNOSIS — E039 Hypothyroidism, unspecified: Secondary | ICD-10-CM

## 2022-05-15 DIAGNOSIS — Z0000 Encounter for general adult medical examination without abnormal findings: Secondary | ICD-10-CM

## 2022-05-27 NOTE — Telephone Encounter (Signed)
Received a fax from  [facility] regarding an approval for [place] patient assistance from 05/27/2022 to 05/20/2023. Approval letter sent to scan center.

## 2022-05-30 ENCOUNTER — Other Ambulatory Visit: Payer: Medicare Other

## 2022-05-30 DIAGNOSIS — E785 Hyperlipidemia, unspecified: Secondary | ICD-10-CM | POA: Diagnosis not present

## 2022-05-30 DIAGNOSIS — Z0000 Encounter for general adult medical examination without abnormal findings: Secondary | ICD-10-CM | POA: Diagnosis not present

## 2022-05-30 DIAGNOSIS — E039 Hypothyroidism, unspecified: Secondary | ICD-10-CM | POA: Diagnosis not present

## 2022-05-31 LAB — T3: T3, Total: 92 ng/dL (ref 71–180)

## 2022-05-31 LAB — LIPID PANEL
Chol/HDL Ratio: 2.1 ratio (ref 0.0–4.4)
Cholesterol, Total: 138 mg/dL (ref 100–199)
HDL: 66 mg/dL (ref >39)
LDL Chol Calc (NIH): 54 mg/dL (ref 0–99)
Triglycerides: 98 mg/dL (ref 0–149)
VLDL Cholesterol Cal: 18 mg/dL (ref 5–40)

## 2022-05-31 LAB — TSH: TSH: 3.16 u[IU]/mL (ref 0.450–4.500)

## 2022-05-31 LAB — T4, FREE: Free T4: 1.52 ng/dL (ref 0.82–1.77)

## 2022-06-05 ENCOUNTER — Ambulatory Visit (INDEPENDENT_AMBULATORY_CARE_PROVIDER_SITE_OTHER): Payer: Medicare Other | Admitting: Nurse Practitioner

## 2022-06-05 DIAGNOSIS — B9689 Other specified bacterial agents as the cause of diseases classified elsewhere: Secondary | ICD-10-CM

## 2022-06-05 DIAGNOSIS — E785 Hyperlipidemia, unspecified: Secondary | ICD-10-CM

## 2022-06-05 DIAGNOSIS — E039 Hypothyroidism, unspecified: Secondary | ICD-10-CM | POA: Diagnosis not present

## 2022-06-05 DIAGNOSIS — J208 Acute bronchitis due to other specified organisms: Secondary | ICD-10-CM | POA: Diagnosis not present

## 2022-06-05 MED ORDER — AZITHROMYCIN 250 MG PO TABS: ORAL_TABLET | ORAL | 0 refills | Status: DC

## 2022-06-05 MED ORDER — LEVOTHYROXINE SODIUM 175 MCG PO TABS: ORAL_TABLET | ORAL | 1 refills | Status: DC

## 2022-06-05 NOTE — Progress Notes (Signed)
Established patient visit   Patient: Joy Williams   DOB: 1950/09/29   72 y.o. Female  MRN: [id number] Visit Date: 06/05/2022  Virtual Visit via Telephone Note  I connected with Joy Williams on 07/11/22 at  1:30 PM EST by telephone and verified that I am speaking with the correct person using two identifiers.  Location: Patient: home Provider: [facility]     I discussed the limitations, risks, security and privacy concerns of performing an evaluation and management service by telephone and the availability of in person appointments. I also discussed with the patient that there may be a patient responsible charge related to this service. The patient expressed understanding and agreed to proceed.   Chief Complaint  Patient presents with   Follow-up   Hyperlipidemia   Hypothyroidism   Subjective    HPI  Follow up  -labs --new thyroid panel was normal.  --currently  on levothyroxine was 175 mcg daily   Upper respiratory infection -cough -headache -nasal congestion  -head congestion. -low-grade fever -fatigue -tested negative for COVID 19  Has been taking OTC mucinex.     Medications: Outpatient Medications Prior to Visit  Medication Sig   acetaminophen (TYLENOL) 500 MG tablet Take 1,000 mg by mouth every 6 (six) hours as needed for mild pain or headache.   Cholecalciferol (VITAMIN D) 125 MCG (5000 UT) CAPS Take 5,000 Units by mouth daily.   clopidogrel (PLAVIX) 75 MG tablet Take 1 tablet (75 mg total) by mouth daily.   EPINEPHrine (EPIPEN 2-PAK) 0.3 mg/0.3 mL IJ SOAJ injection Inject 0.3 mg into the muscle as needed for anaphylaxis.   ferrous sulfate 325 (65 FE) MG tablet Take 1 tablet (325 mg total) by mouth daily.   MELATONIN PO Take 1 tablet by mouth at bedtime as needed (sleep).   Multiple Vitamins-Minerals (MULTIVITAMIN WITH MINERALS) tablet Take 1 tablet by mouth daily.   Nintedanib (OFEV) 100 MG CAPS Take 1 capsule (100 mg  total) by mouth 2 (two) times daily.   rosuvastatin (CRESTOR) 20 MG tablet Take 1 tablet (20 mg total) by mouth every evening.   TRELEGY ELLIPTA 100-62.5-25 MCG/ACT AEPB INHALE 1 PUFF BY MOUTH EVERY DAY   Turmeric 500 MG CAPS Take 500 mg by mouth daily.   Varenicline Tartrate, Starter, (CHANTIX STARTING MONTH PAK) 0.5 MG X 11 & 1 MG X 42 TBPK Days 1-3: 0.5mg  daily,days 4-7: 0.5mg  twice daily,day 8 for next 11 weeks: 1mg  twice daily   vitamin B-12 (CYANOCOBALAMIN) 1000 MCG tablet Take 1 tablet (1,000 mcg total) by mouth daily.   [DISCONTINUED] clarithromycin (BIAXIN) 500 MG tablet Take 1 tablet (500 mg total) orally one hour before dental cleaning/dental procedure.   [DISCONTINUED] hydrALAZINE (APRESOLINE) 25 MG tablet Take 1 tablet (25 mg total) by mouth daily as needed (for systolic WU>981).   [DISCONTINUED] levothyroxine (SYNTHROID) 175 MCG tablet TAKE 1 TABLET BY MOUTH EVERY DAY BEFORE BREAKFAST   No facility-administered medications prior to visit.    Review of Systems  Constitutional:  Positive for fatigue. Negative for activity change, appetite change, chills and fever.  HENT:  Positive for congestion, postnasal drip, rhinorrhea, sinus pressure and sinus pain. Negative for sneezing and sore throat.   Eyes: Negative.   Respiratory:  Positive for cough. Negative for chest tightness, shortness of breath and wheezing.   Cardiovascular:  Negative for chest pain and palpitations.  Gastrointestinal:  Negative for abdominal pain, constipation, diarrhea, nausea and vomiting.  Endocrine: Negative for  cold intolerance, heat intolerance, polydipsia and polyuria.  Genitourinary:  Negative for dyspareunia, dysuria, flank pain, frequency and urgency.  Musculoskeletal:  Negative for arthralgias, back pain and myalgias.  Skin:  Negative for rash.  Allergic/Immunologic: Positive for environmental allergies.  Neurological:  Positive for headaches. Negative for dizziness and weakness.  Hematological:   Negative for adenopathy.  Psychiatric/Behavioral:  The patient is not nervous/anxious.     Last CBC Lab Results  Component Value Date   WBC 9.5 03/18/2022   HGB 13.1 03/18/2022   HCT 39.3 03/18/2022   MCV 90.8 03/18/2022   MCH 27.9 04/07/2021   RDW 13.5 03/18/2022   PLT 219.0 40/98/1191   Last metabolic panel Lab Results  Component Value Date   GLUCOSE 71 03/18/2022   NA 132 (L) 03/18/2022   K 4.2 03/18/2022   CL 96 03/18/2022   CO2 28 03/18/2022   BUN 15 03/18/2022   CREATININE 0.60 03/18/2022   GFRNONAA >60 04/07/2021   CALCIUM 9.5 03/18/2022   PROT 7.5 03/18/2022   ALBUMIN 4.5 03/18/2022   LABGLOB 2.5 11/30/2020   AGRATIO 1.8 11/30/2020   BILITOT 0.5 03/18/2022   ALKPHOS 48 03/18/2022   AST 37 03/18/2022   ALT 27 03/18/2022   ANIONGAP 9 04/07/2021   Last lipids Lab Results  Component Value Date   CHOL 138 05/30/2022   HDL 66 05/30/2022   LDLCALC 54 05/30/2022   LDLDIRECT 63 06/15/2020   TRIG 98 05/30/2022   CHOLHDL 2.1 05/30/2022   Last hemoglobin A1c Lab Results  Component Value Date   HGBA1C 5.5 10/24/2020   Last thyroid functions Lab Results  Component Value Date   TSH 3.160 05/30/2022   T3TOTAL 92 05/30/2022   Last vitamin D Lab Results  Component Value Date   VD25OH 57.9 06/15/2020   Last vitamin B12 and Folate Lab Results  Component Value Date   VITAMINB12 243 08/07/2020   FOLATE 14.1 08/07/2020       Objective      BP Readings from Last 3 Encounters:  03/19/22 116/76  03/18/22 118/62  10/16/21 122/74    Wt Readings from Last 3 Encounters:  03/19/22 131 lb 9.6 oz (59.7 kg)  03/18/22 130 lb 9.6 oz (59.2 kg)  10/16/21 138 lb (62.6 kg)     The patient is alert and oriented. She is pleasant and answers all questions appropriately. Breathing is non-labored. She is in no acute distress at this time.  She is nasally congested    Assessment & Plan    1. Hypothyroidism, unspecified type Thyroid panel stable. Continue  levothyroxine 175 mcg daily - levothyroxine (SYNTHROID) 175 MCG tablet; Take 1 tablet po QAM  Dispense: 90 tablet; Refill: 1  2. Acute bacterial bronchitis Start z-pack. Take as directed for 5 days. Rest and increase fluids. Continue using OTC medication to control symptoms.   - azithromycin (ZITHROMAX) 250 MG tablet; z-pack - take as directed for 5 days  Dispense: 6 tablet; Refill: 0  3. Hyperlipidemia LDL goal <70 Most recent lipid panel stable. Continue crestor as prescribed.     Problem List Items Addressed This Visit       Respiratory   Acute bacterial bronchitis   Relevant Medications   azithromycin (ZITHROMAX) 250 MG tablet     Endocrine   Hypothyroidism - Primary (Chronic)   Relevant Medications   levothyroxine (SYNTHROID) 175 MCG tablet     Other   Hyperlipidemia LDL goal <70     Return in about 3 months (  around 09/04/2022) for medicare wellness, FBW a week prior to visit.     I discussed the assessment and treatment plan with the patient. The patient was provided an opportunity to ask questions and all were answered. The patient agreed with the plan and demonstrated an understanding of the instructions.   The patient was advised to call back or seek an in-person evaluation if the symptoms worsen or if the condition fails to improve as anticipated.  I provided 10 minutes of non-face-to-face time during this encounter.   Carlean Jews, NP    Carlean Jews, NP  [facility] at [facility] [phone] (phone) [phone] (fax)  [facility]

## 2022-06-17 ENCOUNTER — Other Ambulatory Visit: Payer: Self-pay | Admitting: *Deleted

## 2022-06-17 DIAGNOSIS — E785 Hyperlipidemia, unspecified: Secondary | ICD-10-CM

## 2022-06-17 DIAGNOSIS — I2510 Atherosclerotic heart disease of native coronary artery without angina pectoris: Secondary | ICD-10-CM

## 2022-06-17 DIAGNOSIS — I340 Nonrheumatic mitral (valve) insufficiency: Secondary | ICD-10-CM

## 2022-06-17 DIAGNOSIS — Z79899 Other long term (current) drug therapy: Secondary | ICD-10-CM

## 2022-06-17 DIAGNOSIS — Z953 Presence of xenogenic heart valve: Secondary | ICD-10-CM

## 2022-06-19 ENCOUNTER — Other Ambulatory Visit: Payer: Self-pay

## 2022-06-19 DIAGNOSIS — I340 Nonrheumatic mitral (valve) insufficiency: Secondary | ICD-10-CM

## 2022-06-19 DIAGNOSIS — I2510 Atherosclerotic heart disease of native coronary artery without angina pectoris: Secondary | ICD-10-CM

## 2022-06-19 DIAGNOSIS — Z953 Presence of xenogenic heart valve: Secondary | ICD-10-CM

## 2022-06-19 DIAGNOSIS — Z79899 Other long term (current) drug therapy: Secondary | ICD-10-CM

## 2022-06-19 DIAGNOSIS — E785 Hyperlipidemia, unspecified: Secondary | ICD-10-CM

## 2022-06-19 MED ORDER — HYDRALAZINE HCL 25 MG PO TABS: 25.0000 mg | ORAL_TABLET | Freq: Every day | ORAL | 9 refills | Status: DC | PRN

## 2022-06-20 IMAGING — MR MR HEAD W/O CM
12 of 13 series · 44 of 48 positions shown · non-contrast
Comparison: Prior CTs from 08/06/2020 as well as previous MRI from
03/12/2018.

CLINICAL DATA: Initial evaluation for neuro deficit, stroke
suspected.

EXAM:
MRI HEAD WITHOUT CONTRAST
TECHNIQUE: Multiplanar, multiecho pulse sequences of the brain and surrounding
structures were obtained without intravenous contrast.

[Series 5: DWI · axial · 3.0mm · 0.88mm/px · z∈[-90,+54]mm · 8 of 100 slices shown (1 of 4)]
[im 1/100]
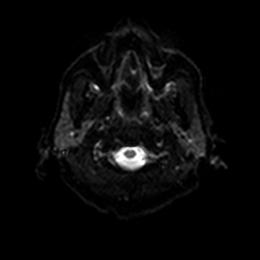
[im 15/100]
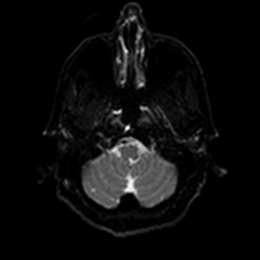
[im 29/100]
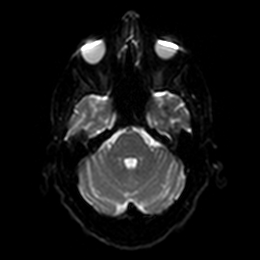
[im 43/100]
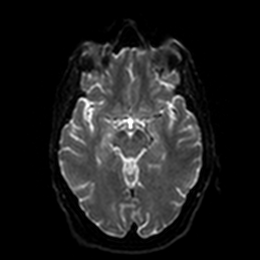
[im 57/100]
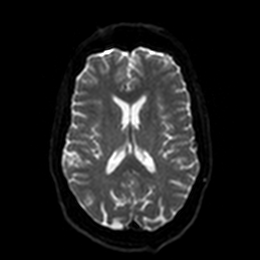
[im 71/100]
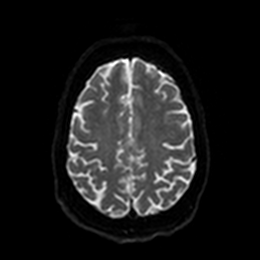
[im 85/100]
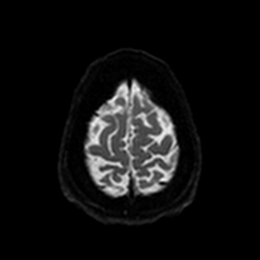
[im 100/100]
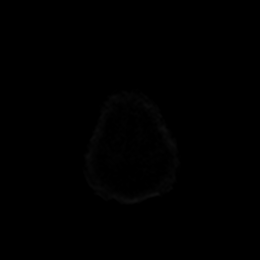

[Series 6: DWI · axial · 3.0mm · 0.88mm/px · z∈[-90,+54]mm · 4 of 49 slices shown (2 of 4)]
[im 1/49]
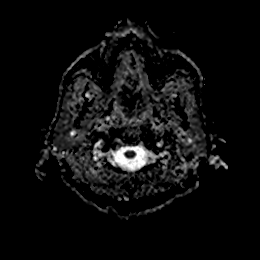
[im 17/49]
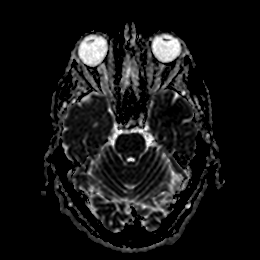
[im 33/49]
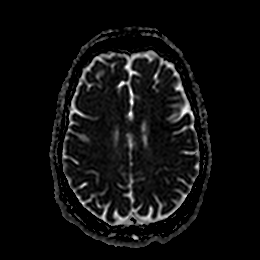
[im 49/49]
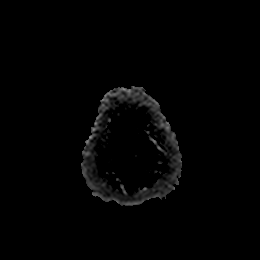

[Series 7: DWI · coronal · 4.0mm · 0.88mm/px · 5 of 70 slices shown (3 of 4)]
[im 1/70]
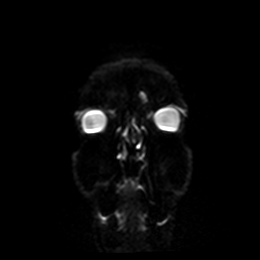
[im 18/70]
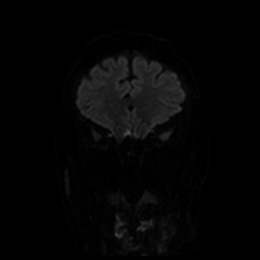
[im 35/70]
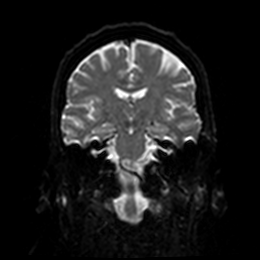
[im 52/70]
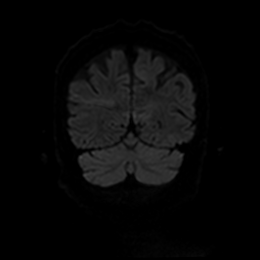
[im 70/70]
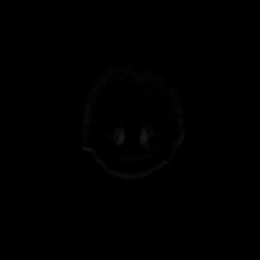

[Series 8: DWI · coronal · 4.0mm · 0.88mm/px · 3 of 35 slices shown (4 of 4)]
[im 1/35]
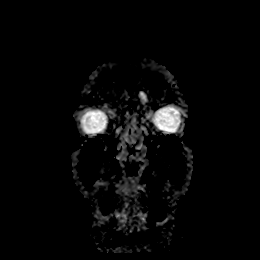
[im 18/35]
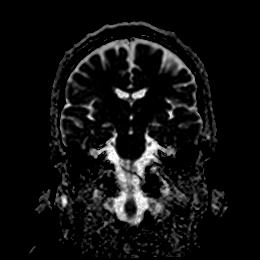
[im 35/35]
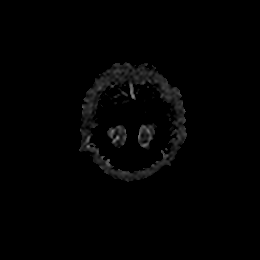

[Series 9: T1 · sagittal · 5.0mm · 0.75mm/px · 2 of 25 slices shown]
[im 1/25]
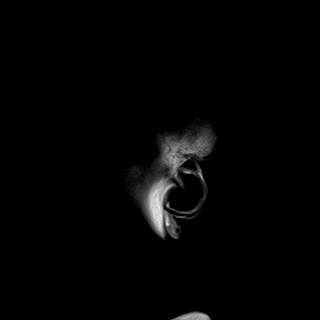
[im 25/25]
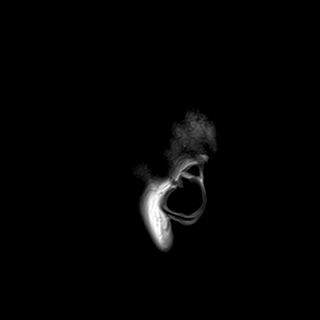

[Series 10: T2 · axial · 5.0mm · 0.72mm/px · z∈[-88,+52]mm · 2 of 25 slices shown (1 of 2)]
[im 1/25]
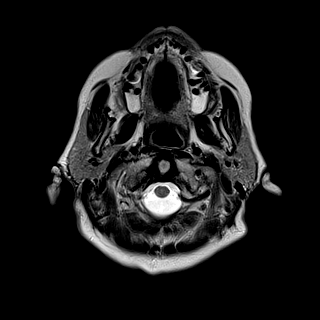
[im 25/25]
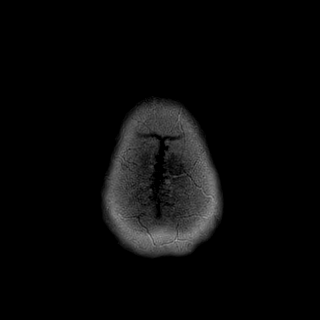

[Series 11: FLAIR · axial · 5.0mm · 0.45mm/px · z∈[-90,+50]mm · 2 of 25 slices shown]
[im 1/25]
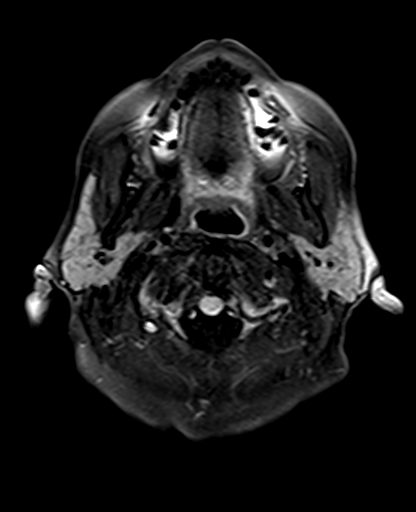
[im 25/25]
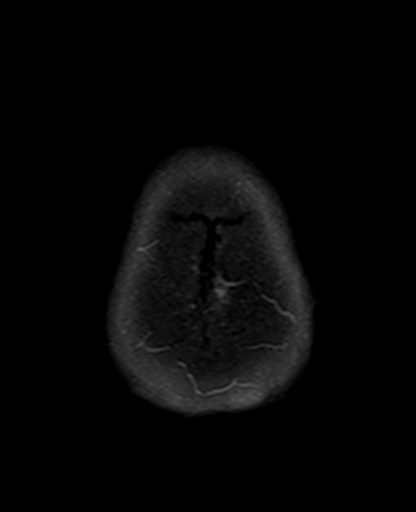

[Series 12: mag_images · axial · 3.0mm · 0.90mm/px · z∈[-99,+61]mm · 4 of 56 slices shown]
[im 1/56]
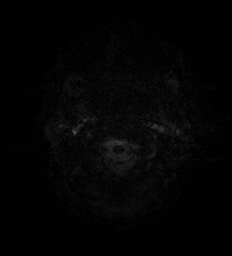
[im 19/56]
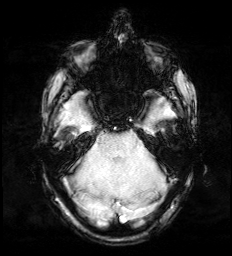
[im 37/56]
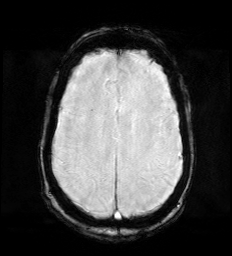
[im 56/56]
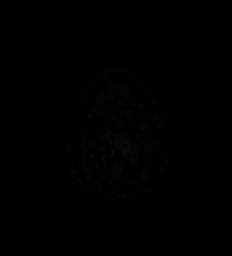

[Series 13: pha_images · axial · 3.0mm · 0.90mm/px · z∈[-99,+58]mm · 4 of 55 slices shown]
[im 1/55]
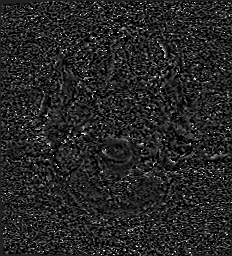
[im 19/55]
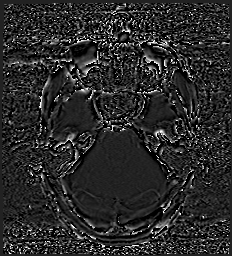
[im 37/55]
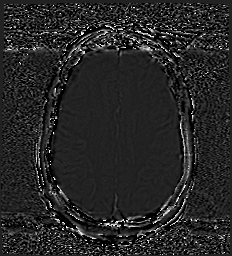
[im 55/55]
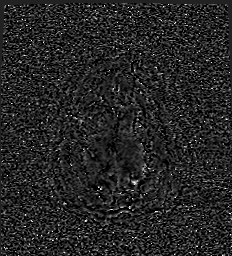

[Series 14: swi_images · axial · 3.0mm · 0.90mm/px · z∈[-99,+61]mm · 4 of 56 slices shown]
[im 1/56]
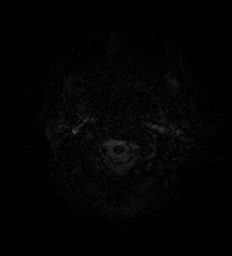
[im 19/56]
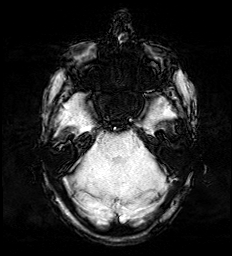
[im 37/56]
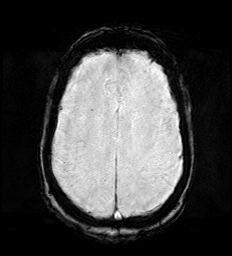
[im 56/56]
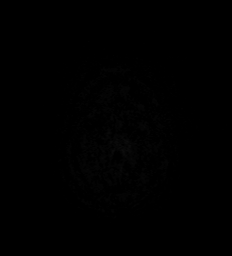

[Series 15: mip_images(sw) · axial · 24.0mm · 0.90mm/px · z∈[-89,+51]mm · 4 of 49 slices shown]
[im 1/49]
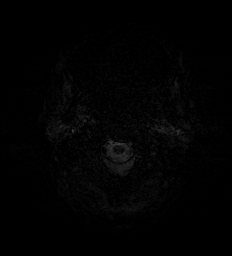
[im 17/49]
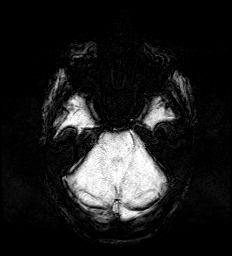
[im 33/49]
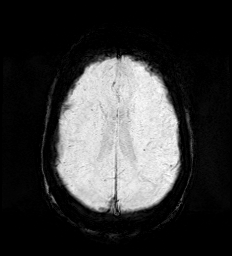
[im 49/49]
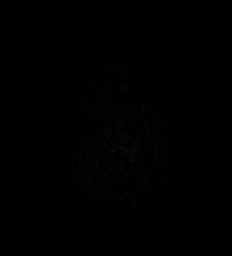

[Series 17: T2 · coronal · 5.0mm · 0.34mm/px · 2 of 30 slices shown (2 of 2)]
[im 1/30]
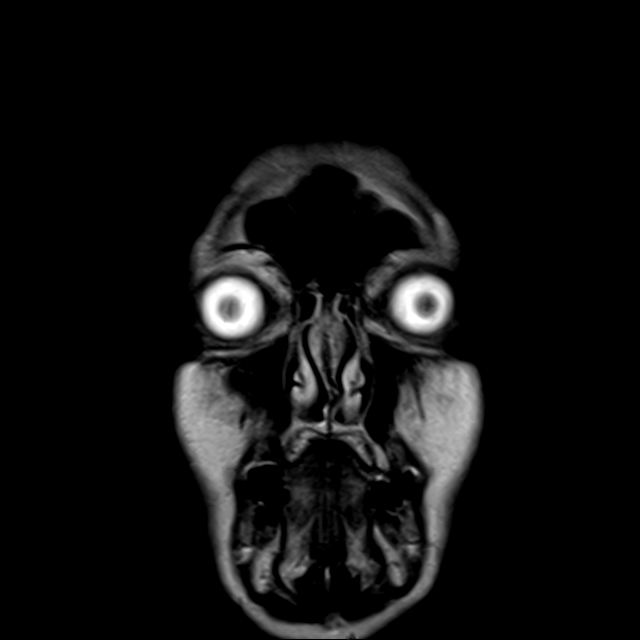
[im 30/30]
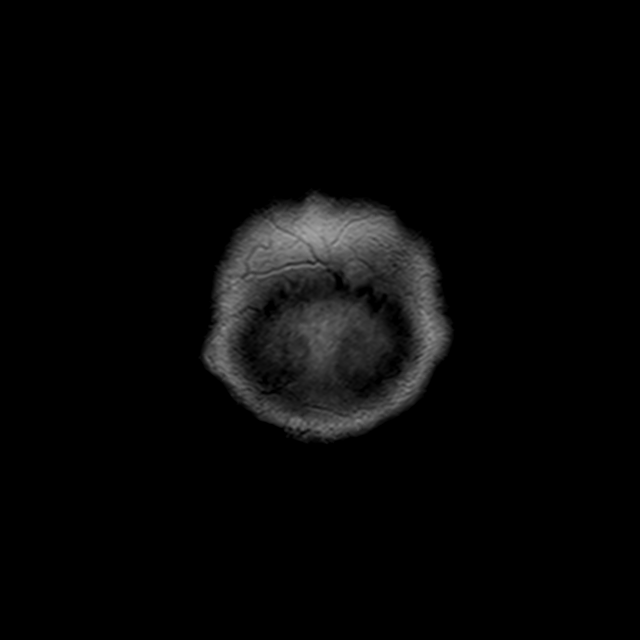

[44 of 48 positions shown; findings below may reference images not displayed]

FINDINGS: Brain: Cerebral volume within normal limits for age. Multiple
scattered remote lacunar type infarcts seen about the bilateral
cerebellar hemispheres and left thalamus. Additional tiny remote
lacunar infarct at the right caudate. Overall, appearance has
progressed as compared to previous MRI from 5269.

No abnormal foci of restricted diffusion to suggest acute or
subacute ischemia. Gray-white matter differentiation otherwise
maintained. No areas of remote cortical infarction. No acute
intracranial hemorrhage. Few punctate foci of susceptibility
artifact noted, consistent with small chronic micro hemorrhages,
suspected to be hypertensive in nature.

No mass lesion, midline shift or mass effect. No hydrocephalus or
extra-axial fluid collection. Pituitary gland and suprasellar region
normal. Midline structures intact.

Vascular: Major intracranial vascular flow voids are maintained.

Skull and upper cervical spine: Craniocervical junction within
normal limits. Bone marrow signal intensity normal. No scalp soft
tissue abnormality.

Sinuses/Orbits: Globes and orbital soft tissues demonstrate no acute
finding. Paranasal sinuses are largely clear. No mastoid effusion.
Inner ear structures grossly normal.

Other: None.
IMPRESSION: 1. No acute intracranial abnormality.
2. Multiple scattered remote lacunar infarcts involving the
bilateral cerebellar hemispheres, left thalamus, and right caudate.
Overall, appearance has progressed as compared to previous MRI from

## 2022-06-25 ENCOUNTER — Telehealth: Payer: Self-pay | Admitting: Cardiology

## 2022-06-25 ENCOUNTER — Encounter: Payer: Self-pay | Admitting: Cardiology

## 2022-06-25 ENCOUNTER — Telehealth: Payer: Medicare Other | Admitting: Physician Assistant

## 2022-06-25 NOTE — Telephone Encounter (Signed)
See mychart message below with the pt from this morning, about BP concerns.   Advisement provided and mychart message also sent to Dr. Johney Frame to further review and advise on, upon return to the office.     You got it.  She very well may respond to this message sometime today, but she is out of the state attending a cardiology conference, so I like to be transparent with our patients about this, so that they know that a response isn't guaranteed.  I am glad you have the as needed hydralazine to go to if the number rises.  Keep [place] posted.    Thanks Karlene Einstein   This MyChart message has not been read.       06/25/22  8:36 AM Thora Lance, RN routed this conversation to [place]  to [place] Triage (supporting Freada Bergeron, MD)      06/25/22  8:31 AM Readings were 157/89 at 8:00am, then 148/77 at 8:15.   Will take Hydralazine if it goes above 150 again.  Also going to take tylenol.   Thanks for the response.    Deb Me  to Freada Bergeron, MD      06/25/22  8:23 AM Please advise Me  to SINIYA LICHTY      06/25/22  8:22 AM So unfortunately, Dr. Johney Frame is away this week.  Looks like your pressures are stable though.  Make sure you avoid any high amounts of salt in your diet.  Also avoid high amounts and fats and sugars in your diet, for this will also increase your BP.  Avoid caffeine as well.   Also please continue your medications and remember you can take your hydralazine as needed if your BP > 150 (top number).   If you feel you are reaching to use this on a daily basis, then let us know, for Dr. Johney Frame may advise to increase this medication for you to take as a scheduled medication.  Continue monitoring your numbers at home (most accurate pressure is 1 -1 1/2 hours after you take your medications).  Your BP goal is to be 150 and less per her last office visit with you.    Wear compression stockings during the day and make sure you stay plenty  hydrated with water.  Also you can take tylenol as needed for headache.    Seek immediate assistance with the ER if you experience the below symptoms:   You have recurrent headaches or feel dizzy. You have swelling in your ankles. You have trouble with your vision. Get help right away if: You have sudden, severe chest, back, or abdominal pain or discomfort. You have shortness of breath. You have a sudden, severe headache        I will still send this message to Dr. Johney Frame for further advisement when she returns, but I think this is a good starting point.   Keep [place] posted on your numbers as you continue monitoring them.  They really are very stable and the above recommendations should help you out.   Thanks, Mathea Frieling Dr. Jacolyn Reedy Nurse  Last read by [name] at  8:24 AM on 06/25/2022.       06/25/22  8:10 AM Thora Lance, RN routed this conversation to [place]  to [place] Triage (supporting Freada Bergeron, MD)      06/25/22  7:36 AM Morning.   I  have had high blood pressure yesterday and today that's very uncomfortable.   Yesterday it was 146/85 with dizziness and major headache.   Came down to 136/64.  This morning it's 142/80.  No headache but feeling off.  Can I talk to you or your nurses?  Maybe come in to see you?   Thanks [employer]

## 2022-06-25 NOTE — Progress Notes (Signed)
Patient was sent by earlier provider to Cardiologist for follow-up first giving her history and current issues. Per EMR she has reached out to Cardiology and been given acute and follow-up instructions.  No charge.

## 2022-06-25 NOTE — Telephone Encounter (Signed)
Pt c/o BP issue: STAT if pt c/o blurred vision, one-sided weakness or slurred speech  1. What are your last 5 BP readings?  157/89 142/80 136/64 146/85 140/72   2. Are you having any other symptoms (ex. Dizziness, headache, blurred vision, passed out)? Patient states she has headache and is dizzy  3. What is your BP issue? High blood pressure  Patient also sent a message via MyChart this morning.

## 2022-07-11 ENCOUNTER — Encounter: Payer: Self-pay | Admitting: Nurse Practitioner

## 2022-07-11 ENCOUNTER — Other Ambulatory Visit: Payer: Self-pay

## 2022-07-11 DIAGNOSIS — B9689 Other specified bacterial agents as the cause of diseases classified elsewhere: Secondary | ICD-10-CM | POA: Insufficient documentation

## 2022-07-11 MED ORDER — CLOPIDOGREL BISULFATE 75 MG PO TABS: 75.0000 mg | ORAL_TABLET | Freq: Every day | ORAL | 2 refills | Status: DC

## 2022-08-01 ENCOUNTER — Encounter: Payer: Self-pay | Admitting: *Deleted

## 2022-08-15 ENCOUNTER — Other Ambulatory Visit: Payer: Self-pay | Admitting: Internal Medicine

## 2022-08-15 DIAGNOSIS — E785 Hyperlipidemia, unspecified: Secondary | ICD-10-CM

## 2022-08-22 ENCOUNTER — Other Ambulatory Visit: Payer: Self-pay | Admitting: Cardiology

## 2022-08-22 ENCOUNTER — Encounter: Payer: Self-pay | Admitting: Cardiology

## 2022-08-22 DIAGNOSIS — E785 Hyperlipidemia, unspecified: Secondary | ICD-10-CM

## 2022-08-22 MED ORDER — ROSUVASTATIN CALCIUM 20 MG PO TABS: 20.0000 mg | ORAL_TABLET | Freq: Every evening | ORAL | 0 refills | Status: DC

## 2022-08-22 NOTE — Progress Notes (Signed)
Per OV note on 10/23 by [place]:  #Carotid artery disease s/p right CEA 07/2020: followed by [name] outpatient - Continue routine monitoring - Continue plavix 75mg  daily and crestor 20mg  daily as above  Scheduled pt for 12m follow-up. Labs completed and in chart from PCP 05/30/22 show LDL of 54, TGS=98, and total chol=138. AST/ALT was not repeated since October, but was normal at that time. Sent in 3 months to carry patient to newly scheduled July appt.

## 2022-08-25 ENCOUNTER — Encounter: Payer: Self-pay | Admitting: Cardiology

## 2022-08-26 ENCOUNTER — Other Ambulatory Visit: Payer: Self-pay

## 2022-08-26 DIAGNOSIS — I5021 Acute systolic (congestive) heart failure: Secondary | ICD-10-CM

## 2022-08-26 DIAGNOSIS — E039 Hypothyroidism, unspecified: Secondary | ICD-10-CM

## 2022-08-26 DIAGNOSIS — E785 Hyperlipidemia, unspecified: Secondary | ICD-10-CM

## 2022-08-28 ENCOUNTER — Other Ambulatory Visit: Payer: Medicare Other

## 2022-08-28 DIAGNOSIS — I5021 Acute systolic (congestive) heart failure: Secondary | ICD-10-CM

## 2022-08-28 DIAGNOSIS — Z79899 Other long term (current) drug therapy: Secondary | ICD-10-CM

## 2022-08-28 DIAGNOSIS — I2510 Atherosclerotic heart disease of native coronary artery without angina pectoris: Secondary | ICD-10-CM

## 2022-08-28 DIAGNOSIS — E039 Hypothyroidism, unspecified: Secondary | ICD-10-CM

## 2022-08-28 DIAGNOSIS — E785 Hyperlipidemia, unspecified: Secondary | ICD-10-CM | POA: Diagnosis not present

## 2022-08-28 DIAGNOSIS — I340 Nonrheumatic mitral (valve) insufficiency: Secondary | ICD-10-CM

## 2022-08-28 DIAGNOSIS — Z953 Presence of xenogenic heart valve: Secondary | ICD-10-CM

## 2022-08-29 LAB — CBC WITH DIFFERENTIAL/PLATELET
Basophils Absolute: 0 10*3/uL (ref 0.0–0.2)
Basos: 0 %
EOS (ABSOLUTE): 0.1 10*3/uL (ref 0.0–0.4)
Eos: 1 %
Hematocrit: 41.4 % (ref 34.0–46.6)
Hemoglobin: 13.6 g/dL (ref 11.1–15.9)
Immature Grans (Abs): 0.1 10*3/uL (ref 0.0–0.1)
Immature Granulocytes: 1 %
Lymphocytes Absolute: 1.1 10*3/uL (ref 0.7–3.1)
Lymphs: 12 %
MCH: 29.7 pg (ref 26.6–33.0)
MCHC: 32.9 g/dL (ref 31.5–35.7)
MCV: 90 fL (ref 79–97)
Monocytes Absolute: 0.5 10*3/uL (ref 0.1–0.9)
Monocytes: 5 %
Neutrophils Absolute: 7.4 10*3/uL — ABNORMAL HIGH (ref 1.4–7.0)
Neutrophils: 81 %
Platelets: 216 10*3/uL (ref 150–450)
RBC: 4.58 x10E6/uL (ref 3.77–5.28)
RDW: 12.3 % (ref 11.7–15.4)
WBC: 9.1 10*3/uL (ref 3.4–10.8)

## 2022-08-29 LAB — LIPID PANEL
Chol/HDL Ratio: 2.1 ratio (ref 0.0–4.4)
Cholesterol, Total: 142 mg/dL (ref 100–199)
HDL: 67 mg/dL (ref >39)
LDL Chol Calc (NIH): 55 mg/dL (ref 0–99)
Triglycerides: 112 mg/dL (ref 0–149)
VLDL Cholesterol Cal: 20 mg/dL (ref 5–40)

## 2022-08-29 LAB — COMPREHENSIVE METABOLIC PANEL
ALT: 26 IU/L (ref 0–32)
AST: 33 IU/L (ref 0–40)
Albumin/Globulin Ratio: 1.8 (ref 1.2–2.2)
Albumin: 4.4 g/dL (ref 3.8–4.8)
Alkaline Phosphatase: 59 IU/L (ref 44–121)
BUN/Creatinine Ratio: 18 (ref 12–28)
BUN: 12 mg/dL (ref 8–27)
Bilirubin Total: 0.4 mg/dL (ref 0.0–1.2)
CO2: 25 mmol/L (ref 20–29)
Calcium: 9.8 mg/dL (ref 8.7–10.3)
Chloride: 94 mmol/L — ABNORMAL LOW (ref 96–106)
Creatinine, Ser: 0.68 mg/dL (ref 0.57–1.00)
Globulin, Total: 2.5 g/dL (ref 1.5–4.5)
Glucose: 75 mg/dL (ref 70–99)
Potassium: 4.5 mmol/L (ref 3.5–5.2)
Sodium: 134 mmol/L (ref 134–144)
Total Protein: 6.9 g/dL (ref 6.0–8.5)
eGFR: 93 mL/min/{1.73_m2} (ref >59)

## 2022-08-29 LAB — TSH: TSH: 4.97 u[IU]/mL — ABNORMAL HIGH (ref 0.450–4.500)

## 2022-08-30 ENCOUNTER — Encounter: Payer: Self-pay | Admitting: Family Medicine

## 2022-09-03 ENCOUNTER — Ambulatory Visit: Payer: Medicare Other | Admitting: Family Medicine

## 2022-09-04 ENCOUNTER — Ambulatory Visit (INDEPENDENT_AMBULATORY_CARE_PROVIDER_SITE_OTHER): Payer: Medicare Other | Admitting: Family Medicine

## 2022-09-04 ENCOUNTER — Encounter: Payer: Self-pay | Admitting: Family Medicine

## 2022-09-04 VITALS — BP 100/64 | HR 68 | Resp 18 | Ht 66.0 in | Wt 130.0 lb

## 2022-09-04 DIAGNOSIS — E039 Hypothyroidism, unspecified: Secondary | ICD-10-CM

## 2022-09-04 DIAGNOSIS — Z0000 Encounter for general adult medical examination without abnormal findings: Secondary | ICD-10-CM | POA: Diagnosis not present

## 2022-09-04 DIAGNOSIS — Z1211 Encounter for screening for malignant neoplasm of colon: Secondary | ICD-10-CM

## 2022-09-04 NOTE — Patient Instructions (Signed)
Go to the pharmacy and let them know that you need the PCV 20 (Prevnar 20) pneumonia vaccine.  I have sent in another Cologuard for you and it will be mailed to your house.  Opening the box can be the most challenging step, but I have faith that you can do it!  Enjoy your walks with your dog, it was nice to meet you today!

## 2022-09-04 NOTE — Assessment & Plan Note (Signed)
TSH on 08/28/2022 was elevated at 4.970.  T4 was not drawn at that time, will check today and adjust levothyroxine as needed.  Unless otherwise indicated by labs, continue levothyroxine 175 mcg daily.  Will continue to monitor.

## 2022-09-04 NOTE — Progress Notes (Signed)
Subjective:   Joy Williams is a 72 y.o. female who presents for Medicare Annual (Subsequent) preventive examination.  Review of Systems    Review of Systems  All other systems reviewed and are negative.   Cardiac Risk Factors include: none     Objective:    Today's Vitals   09/04/22 1319 09/04/22 1323  BP: 100/64   Pulse: 68   Resp: 18   SpO2: 98%   Weight: 130 lb (59 kg)   Height: 5\' 6"  (1.676 m)   PainSc: 0-No pain 0-No pain   Body mass index is 20.98 kg/m.     09/04/2022    1:27 PM 04/07/2021    1:11 PM 11/03/2020    1:03 PM 10/25/2020    1:00 AM 08/07/2020    3:09 PM 08/06/2020    4:57 PM 02/24/2019    8:56 PM  Advanced Directives  Does Patient Have a Medical Advance Directive? No No No No Yes No Yes  Type of Agricultural consultant;Living will  Healthcare Power of [place];Living will  Does patient want to make changes to medical advance directive?     No - Patient declined    Copy of Healthcare Power of Attorney in Chart?     Yes - validated most recent copy scanned in chart (See row information)  No - copy requested, Physician notified  Would patient like information on creating a medical advance directive? No - Patient declined  No - Patient declined No - Patient declined No - Patient declined  No - Patient declined    Current Medications (verified) Outpatient Encounter Medications as of 09/04/2022  Medication Sig   acetaminophen (TYLENOL) 500 MG tablet Take 1,000 mg by mouth every 6 (six) hours as needed for mild pain or headache.   azithromycin (ZITHROMAX) 250 MG tablet z-pack - take as directed for 5 days   Cholecalciferol (VITAMIN D) 125 MCG (5000 UT) CAPS Take 5,000 Units by mouth daily.   clopidogrel (PLAVIX) 75 MG tablet Take 1 tablet (75 mg total) by mouth daily.   EPINEPHrine (EPIPEN 2-PAK) 0.3 mg/0.3 mL IJ SOAJ injection Inject 0.3 mg into the muscle as needed for anaphylaxis.   ferrous sulfate 325 (65 FE) MG tablet Take  1 tablet (325 mg total) by mouth daily.   hydrALAZINE (APRESOLINE) 25 MG tablet Take 1 tablet (25 mg total) by mouth daily as needed (for systolic BP>150).   levothyroxine (SYNTHROID) 175 MCG tablet Take 1 tablet po QAM   MELATONIN PO Take 1 tablet by mouth at bedtime as needed (sleep).   Multiple Vitamins-Minerals (MULTIVITAMIN WITH MINERALS) tablet Take 1 tablet by mouth daily.   Nintedanib (OFEV) 100 MG CAPS Take 1 capsule (100 mg total) by mouth 2 (two) times daily.   rosuvastatin (CRESTOR) 20 MG tablet Take 1 tablet (20 mg total) by mouth every evening.   TRELEGY ELLIPTA 100-62.5-25 MCG/ACT AEPB INHALE 1 PUFF BY MOUTH EVERY DAY   Turmeric 500 MG CAPS Take 500 mg by mouth daily.   Varenicline Tartrate, Starter, (CHANTIX STARTING MONTH PAK) 0.5 MG X 11 & 1 MG X 42 TBPK Days 1-3: 0.5mg  daily,days 4-7: 0.5mg  twice daily,day 8 for next 11 weeks: 1mg  twice daily   vitamin B-12 (CYANOCOBALAMIN) 1000 MCG tablet Take 1 tablet (1,000 mcg total) by mouth daily.   No facility-administered encounter medications on file as of 09/04/2022.    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 [facility] (had to have botox injection)   COPD (chronic obstructive pulmonary disease)    Family history of adverse reaction to anesthesia    Mother- very sensitive to medication   Heart murmur    MVP   Hypothyroidism    Lung cancer 12/30/2014   Synchronous primaries:  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, each treated by wedge resection with post-op adjuvant chemoRx at [facility]   MVP (mitral valve prolapse) 2018   s/p Minimally-Invasive Mitral Valve Replacement w/  [facility] Mitral bovine  bioprosthetic tissue valve (size 31mm, model # 7300TFX, serial # [id number])   PONV (postoperative nausea and vomiting)    Raynaud's syndrome    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   31 mm [facility] mitral bovine bioprosthetic tissue valve placed via right mini thoracotomy approach   Severe mitral regurgitation 11/15/2014   Shortness of breath dyspnea    with exertion   STD (sexually transmitted disease)    STEMI (ST elevation myocardial infarction) 10/24/2020   Telangiectasia    Past Surgical History:  Procedure Laterality Date   BACK SURGERY     x 3  Disectomy   BREAST BIOPSY Left    CARDIAC CATHETERIZATION N/A 03/08/2016   Procedure: Right/Left Heart Cath and Coronary Angiography;  Surgeon: Tonny Bollman, MD;  Location: [facility] INVASIVE CV LAB;  Service: Cardiovascular;  Laterality: N/A;   CLAVICLE SURGERY Left 2013   plate to left collar bone   ENDARTERECTOMY Right 08/09/2020   Procedure: RIGHT CAROTID ENDARTERECTOMY;  Surgeon: Maeola Harman, MD;  Location: [facility] OR;  Service: Vascular;  Laterality: Right;   LAPAROSCOPY     ? reason-age 53    LEFT HEART CATH AND CORONARY ANGIOGRAPHY N/A 02/25/2019   Procedure: LEFT HEART CATH AND CORONARY ANGIOGRAPHY;  Surgeon: Corky Crafts, MD;  Location: [facility] INVASIVE CV LAB;  Service: Cardiovascular;  Laterality: N/A;   LEFT HEART CATH AND CORONARY ANGIOGRAPHY N/A 10/24/2020   Procedure: LEFT HEART CATH AND CORONARY ANGIOGRAPHY;  Surgeon: Corky Crafts, MD;  Location: [facility] INVASIVE CV LAB;  Service: Cardiovascular;  Laterality: N/A;   LUNG CANCER SURGERY     MITRAL VALVE REPAIR Right 06/27/2016   Procedure: MINIMALLY INVASIVE MITRAL VALVE REPLACEMENT;  Surgeon: Purcell Nails, MD;  Location: [facility] OR;  Service: Open Heart Surgery;  Laterality: Right;   TEE WITHOUT CARDIOVERSION N/A 02/22/2016   Procedure: TRANSESOPHAGEAL ECHOCARDIOGRAM (TEE);  Surgeon: Lars Masson, MD;  Location: [facility]  ENDOSCOPY;  Service: Cardiovascular;  Laterality: N/A;   TEE WITHOUT CARDIOVERSION N/A 06/27/2016   Procedure: TRANSESOPHAGEAL ECHOCARDIOGRAM (TEE);  Surgeon: Purcell Nails, MD;  Location: [facility] OR;  Service: Open Heart Surgery;  Laterality: N/A;   TONSILLECTOMY     VIDEO ASSISTED THORACOSCOPY (VATS)/WEDGE RESECTION Left 12/30/2014   Bronchoscopy, Mediastinoscopy, Left VATS for Wedge resection LUL x2 adn LLL x1 - Dr. Ewing Schlein at [facility]   VOCAL CORD INJECTION Left 2017   injected with botox   wrist surgery Left 2015   plate to wrist    Family History  Problem Relation Age of Onset  Mitral valve prolapse Mother    Dementia Father    Prostate cancer Father    Mitral valve prolapse Sister    Mitral valve prolapse Brother    Healthy Son    Healthy Daughter    Colon cancer Neg Hx    Colon polyps Neg Hx    Rectal cancer Neg Hx    Esophageal cancer Neg Hx    Stomach cancer Neg Hx    Heart attack Neg Hx    Social History   Socioeconomic History   Marital status: Divorced    Spouse name: Not on file   Number of children: Not on file   Years of education: 16   Highest education level: Not on file  Occupational History   Occupation: Retired  Tobacco Use   Smoking status: Some Days    Packs/day: 1.00    Years: 24.00    Additional pack years: 0.00    Total pack years: 24.00    Types: Cigarettes    Start date: 05/21/1983    Passive exposure: Never   Smokeless tobacco: Never   Tobacco comments:    Currently smoking 5 cigs a day as of 03/18/22 ep  Vaping Use   Vaping Use: Never used  Substance and Sexual Activity   Alcohol use: Yes    Alcohol/week: 2.0 standard drinks of alcohol    Types: 1 Shots of liquor, 1 Standard drinks or equivalent per week    Comment: occ   Drug use: No   Sexual activity: Not Currently    Partners: Male    Birth control/protection: Post-menopausal  Other Topics Concern   Not on file  Social History Narrative   Not on file   Social Determinants of  Health   Financial Resource Strain: Low Risk  (09/04/2022)   Overall Financial Resource Strain (CARDIA)    Difficulty of Paying Living Expenses: Not hard at all  Food Insecurity: No Food Insecurity (09/04/2022)   Hunger Vital Sign    Worried About Running Out of Food in the Last Year: Never true    Ran Out of Food in the Last Year: Never true  Transportation Needs: No Transportation Needs (09/04/2022)   PRAPARE - Administrator, Civil Service (Medical): No    Lack of Transportation (Non-Medical): No  Physical Activity: Insufficiently Active (09/04/2022)   Exercise Vital Sign    Days of Exercise per Week: 4 days    Minutes of Exercise per Session: 30 min  Stress: No Stress Concern Present (09/04/2022)   [employer] of Occupational Health - Occupational Stress Questionnaire    Feeling of Stress : Not at all  Social Connections: Moderately Integrated (09/04/2022)   Social Connection and Isolation Panel [NHANES]    Frequency of Communication with Friends and Family: More than three times a week    Frequency of Social Gatherings with Friends and Family: Twice a week    Attends Religious Services: More than 4 times per year    Active Member of [employer] or Organizations: Yes    Attends Engineer, structural: More than 4 times per year    Marital Status: Divorced    Tobacco Counseling Ready to quit: Not Answered Counseling given: Not Answered Tobacco comments: Currently smoking 5 cigs a day as of 03/18/22 ep   Clinical Intake:  Pre-visit preparation completed: No  Pain : No/denies pain Pain Score: 0-No pain     BMI - recorded: 20.98 Nutritional Status: BMI of 19-24  Normal Nutritional  Risks: None Diabetes: No  How often do you need to have someone help you when you read instructions, pamphlets, or other written materials from your doctor or pharmacy?: 1 - Never  Diabetic? No  Interpreter Needed?: No  Information entered by :: Jacquelyne Balint,  RMA   Activities of Daily Living    09/04/2022    1:26 PM 09/03/2022    4:44 PM  In your present state of health, do you have any difficulty performing the following activities:  Hearing? 0   Vision? 0 0  Difficulty concentrating or making decisions? 0 0  Walking or climbing stairs? 0 0  Dressing or bathing? 0 0  Doing errands, shopping? 0 0  Preparing Food and eating ? N N  Using the Toilet? N N  In the past six months, have you accidently leaked urine? N N  Do you have problems with loss of bowel control? N N  Managing your Medications? N N  Managing your Finances? N N  Housekeeping or managing your Housekeeping? N N    Patient Care Team: Melida Quitter, PA as PCP - General (Family Medicine) [name], MD as PCP - Cardiology (Cardiology) [name], MD as Consulting Physician (Pulmonary Disease) [name], MD as Consulting Physician (Rheumatology) [name], MD as Referring Physician [name], MD as Referring Physician (Internal Medicine) [name], MD as Consulting Physician (Infectious Diseases) [name], Rounding, MD as Rounding Team (Cardiology)  Indicate any recent Medical Services you may have received from other than [facility] providers in the past year (date may be approximate).     Assessment:   This is a routine wellness examination for Joy Williams.  Hearing/Vision screen No results found.  Dietary issues and exercise activities discussed: Current Exercise Habits: Home exercise routine, Time (Minutes): 30, Frequency (Times/Week): 4, Weekly Exercise (Minutes/Week): 120, Intensity: Moderate, Exercise limited by: None identified   Goals Addressed               This Visit's Progress     Patient Stated (pt-stated)        Patient states that she would like to walk more with her dog.      Depression Screen    09/04/2022    1:22 PM 03/06/2021    2:23 PM 02/13/2021    8:46 AM 12/19/2020    9:45 AM 11/30/2020    10:29 AM 08/17/2020    3:19 PM 06/15/2020    2:27 PM  PHQ 2/9 Scores  PHQ - 2 Score 0 0 0 0 0 0 0  PHQ- 9 Score  0  1 0 0 0    Fall Risk    09/04/2022    1:25 PM 09/03/2022    4:44 PM 03/06/2021    2:22 PM 02/13/2021    9:55 AM 12/19/2020    9:45 AM  Fall Risk   Falls in the past year? 0 0 0 0 0  Number falls in past yr: 0 0 0 0 0  Injury with Fall? 0 0 0 0 0  Risk for fall due to : No Fall Risks  History of fall(s);Impaired balance/gait Impaired balance/gait;Other (Comment) No Fall Risks  Risk for fall due to: Comment    H/O of dizziness with change in position   Follow up   Falls evaluation completed Falls evaluation completed Falls evaluation completed    FALL RISK PREVENTION PERTAINING TO THE HOME:  Any stairs in or around the home? Yes  If so, are there any  without handrails? Yes  Home free of loose throw rugs in walkways, pet beds, electrical cords, etc? Yes  Adequate lighting in your home to reduce risk of falls? Yes   ASSISTIVE DEVICES UTILIZED TO PREVENT FALLS:  Life alert? No  Use of a cane, walker or w/c? No  Grab bars in the bathroom? No  Shower chair or bench in shower? No  Elevated toilet seat or a handicapped toilet? No   TIMED UP AND GO:  Was the test performed? Yes .  Length of time to ambulate 10 feet: 10-15 sec.   Gait steady and fast without use of assistive device  Cognitive Function:        09/04/2022    1:25 PM 12/19/2020    9:29 AM 12/15/2019    1:30 PM 04/27/2018   10:58 AM 01/22/2017    3:46 PM  6CIT Screen  What Year? 0 points 0 points 0 points 0 points 0 points  What month? 0 points 0 points 0 points 0 points 0 points  What time? 0 points 0 points 0 points 0 points 0 points  Count back from 20 0 points 0 points 0 points 0 points 0 points  Months in reverse 0 points 0 points 0 points 0 points 0 points  Repeat phrase 0 points 2 points 2 points 2 points 0 points  Total Score 0 points 2 points 2 points 2 points 0 points     Immunizations Immunization History  Administered Date(s) Administered   Fluad Quad(high Dose 65+) 02/18/2019, 02/15/2021   H1N1 04/13/2008   Influenza Split 02/18/2015   Influenza, High Dose Seasonal PF 02/20/2017, 04/27/2018, 03/04/2022   Influenza,inj,Quad PF,6+ Mos 03/16/2015   Influenza-Unspecified 04/12/2008, 02/18/2011   Moderna Sars-Covid-2 Vaccination 06/21/2019, 07/18/2019, 01/25/2020   PFIZER(Purple Top)SARS-COV-2 Vaccination 03/04/2022   Pneumococcal Conjugate-13 01/22/2017   Tdap 09/26/2015   Zoster Recombinat (Shingrix) 05/10/2018, 03/03/2019    Pneumococcal vaccine status: Up to date  Flu Vaccine status: Up to date  Pneumococcal vaccine status: Due, Education has been provided regarding the importance of this vaccine. Advised may receive this vaccine at local pharmacy or Health Dept. Aware to provide a copy of the vaccination record if obtained from local pharmacy or Health Dept. Verbalized acceptance and understanding.  Covid-19 vaccine status: Information provided on how to obtain vaccines.   Qualifies for Shingles Vaccine? Yes   Zostavax completed No   Shingrix Completed?: Yes  Screening Tests Health Maintenance  Topic Date Due   COLONOSCOPY (Pts 45-4yrs Insurance coverage will need to be confirmed)  Never done   Pneumonia Vaccine 23+ Years old (2 of 2 - PPSV23 or PCV20) 03/19/2017   COVID-19 Vaccine (5 - 2023-24 season) 04/29/2022   INFLUENZA VACCINE  12/19/2022   MAMMOGRAM  02/23/2023   Medicare Annual Wellness (AWV)  09/04/2023   DTaP/Tdap/Td (2 - Td or Tdap) 09/25/2025   DEXA SCAN  Completed   Hepatitis C Screening  Completed   Zoster Vaccines- Shingrix  Completed   HPV VACCINES  Aged Out    Health Maintenance  Health Maintenance Due  Topic Date Due   COLONOSCOPY (Pts 45-27yrs Insurance coverage will need to be confirmed)  Never done   Pneumonia Vaccine 60+ Years old (2 of 2 - PPSV23 or PCV20) 03/19/2017   COVID-19 Vaccine (5 - 2023-24  season) 04/29/2022   Colorectal cancer screening: Never completed before.  Placed order for Cologuard per patient's request, will need to repeat in 3 years if normal.  Mammogram status: Completed  02/22/2021. Repeat every year  Bone Density status: Completed 02/27/2021. Results reflect: Bone density results: OSTEOPENIA. Repeat every 2 years.  Lung Cancer Screening: (Low Dose CT Chest recommended if Age 55-80 years, 30 pack-year currently smoking OR have quit w/in 15years.) does not qualify.     Additional Screening:  Hepatitis C Screening: does qualify; Completed 01/22/2017  Vision Screening: Recommended annual ophthalmology exams for early detection of glaucoma and other disorders of the eye. Is the patient up to date with their annual eye exam?  No  Who is the provider or what is the name of the office in which the patient attends annual eye exams?  If pt is not established with a provider, would they like to be referred to a provider to establish care?  Patient will schedule .   Dental Screening: Recommended annual dental exams for proper oral hygiene  Community Resource Referral / Chronic Care Management: CRR required this visit?  No   CCM required this visit?  No      Plan:    We discussed preventative care including need for colorectal cancer screening and pneumonia vaccine.  Patient prefers Cologuard but has struggled in the past to get over the mental block of providing a sample.  We discussed strategies to do so and I ordered another kit to be mailed to her house.  Patient states that she will try to complete it in the next couple of months.  Provided printed patient education on PCV 20 and patient plans to go to her local pharmacy to have this completed.  Encounter for Medicare annual wellness exam  Hypothyroidism, unspecified type Assessment & Plan: TSH on 08/28/2022 was elevated at 4.970.  T4 was not drawn at that time, will check today and adjust levothyroxine as  needed.  Unless otherwise indicated by labs, continue levothyroxine 175 mcg daily.  Will continue to monitor.  Orders: -     T4, free; Future  Screening for colorectal cancer -     Cologuard   I have personally reviewed and noted the following in the patient's chart:   Medical and social history Use of alcohol, tobacco or illicit drugs  Current medications and supplements including opioid prescriptions. Patient is not currently taking opioid prescriptions. Functional ability and status Nutritional status Physical activity Advanced directives List of other physicians Hospitalizations, surgeries, and ER visits in previous 12 months Vitals Screenings to include cognitive, depression, and falls Referrals and appointments  In addition, I have reviewed and discussed with patient certain preventive protocols, quality metrics, and best practice recommendations. A written personalized care plan for preventive services as well as general preventive health recommendations were provided to patient.    Return in 6 months (on 03/06/2023) for follow-up for HLD and hypothyroidism; fasting blood work 1 week before.   Melida Quitter, PA   09/04/2022   Nurse Notes: Face to face 25 min

## 2022-09-05 LAB — T4, FREE: Free T4: 1.54 ng/dL (ref 0.82–1.77)

## 2022-09-06 IMAGING — DX DG CHEST 1V PORT
1 series · 1 of 1 positions shown · non-contrast
Comparison: 02/24/2019

CLINICAL DATA: Chest pain

EXAM:
PORTABLE CHEST 1 VIEW

[chest]
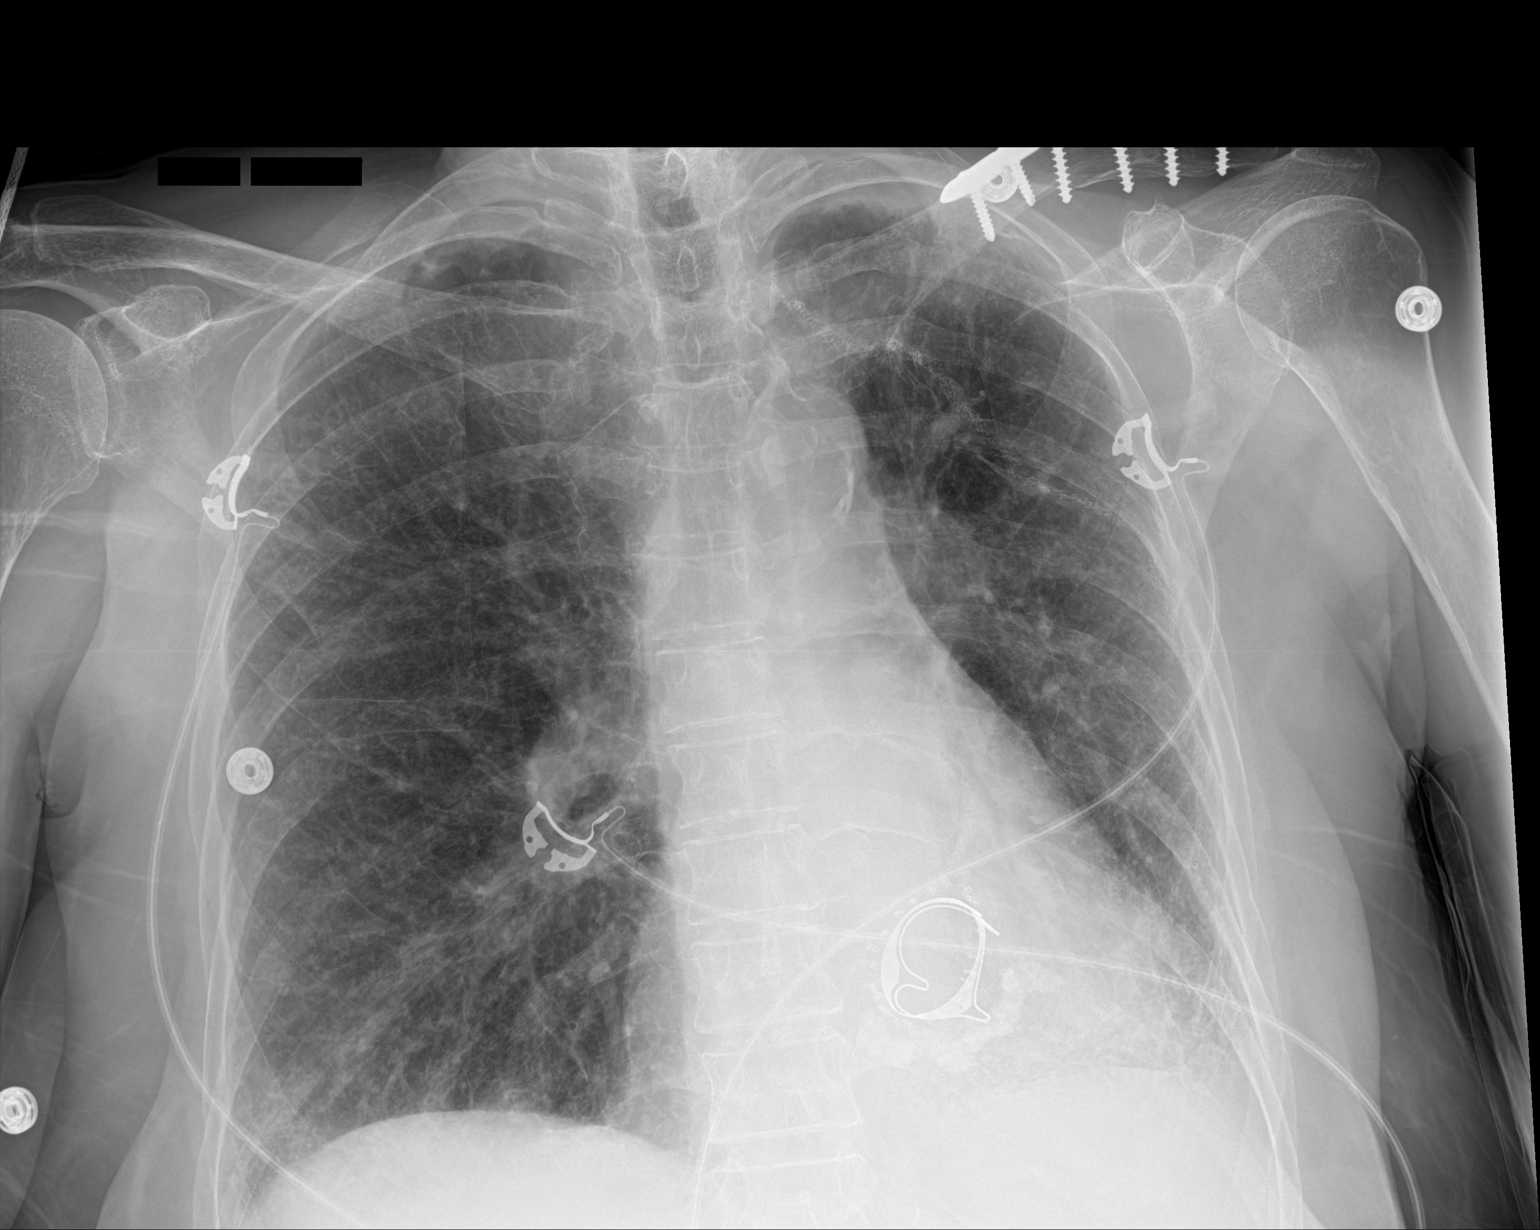

[1 of 1 positions shown; findings below may reference images not displayed]

FINDINGS: Partial left lung resection with staple line again noted at the left
apex. Mild bibasilar interstitial infiltrate is again seen, stable
since prior examination, more severe within the left lung base in
keeping with bibasilar pulmonary fibrosis. Chronic thickening of the
pulmonary interstitium relates to mild underlying interstitial lung
disease. No superimposed confluent pulmonary infiltrate. No
pneumothorax or pleural effusion. Mitral valve replacement has been
performed. Cardiac size is within normal limits. The pulmonary
vascularity is normal. Remote left clavicle ORIF with
instrumentation has been performed. No acute bone abnormality.
IMPRESSION: No radiographic evidence of acute cardiopulmonary disease.

Stable bibasilar pulmonary fibrotic change.

## 2022-09-12 DIAGNOSIS — M25552 Pain in left hip: Secondary | ICD-10-CM | POA: Diagnosis not present

## 2022-09-23 ENCOUNTER — Encounter: Payer: Self-pay | Admitting: Internal Medicine

## 2022-09-24 DIAGNOSIS — M25552 Pain in left hip: Secondary | ICD-10-CM | POA: Diagnosis not present

## 2022-09-24 NOTE — Telephone Encounter (Signed)
Joy Williams   -Last seen by me in October 2023.  After that she was supposed to have a full week video visit with nurse practitioner for quit smoking and then come back to see me in 12-16 weeks with spirometry.  However none of this happened.  Thi is  risk particularly with medication such as nintedanib.  So not sure what happened  Plan - Stop nintedanib -Give her an appointment to do spirometry and DLCO 0 return to see me anytime in the next 4 weeks -Should not take nintedanib till she sees me       Latest Ref Rng & Units 03/18/2022    1:47 PM 02/15/2021    2:49 PM 08/05/2019   10:55 AM 03/12/2016   10:26 AM 11/14/2014   11:05 AM  PFT Results  FVC-Pre L 2.48  2.35  2.52  2.67  2.97   FVC-Predicted Pre % 77  71  74  76  84   FVC-Post L  2.41  2.52  2.61  3.00   FVC-Predicted Post %  72  74  75  85   Pre FEV1/FVC % % 81  78  81  78  78   Post FEV1/FCV % %  81  82  80  78   FEV1-Pre L 2.00  1.84  2.04  2.09  2.33   FEV1-Predicted Pre % 82  73  79  78  86   FEV1-Post L  1.96  2.06  2.09  2.33   DLCO uncorrected ml/min/mmHg 13.70  12.17  13.77  13.52  14.61   DLCO UNC% % 66  57  64  48  52   DLCO corrected ml/min/mmHg 13.70  12.17  13.77  14.73    DLCO COR %Predicted % 66  57  64  53    DLVA Predicted % 83  86  87  84  65   TLC L  3.83  4.22  4.18  4.88   TLC % Predicted %  70  77  77  89   RV % Predicted %  64  74  71  88

## 2022-09-25 ENCOUNTER — Other Ambulatory Visit: Payer: Self-pay | Admitting: Internal Medicine

## 2022-10-04 DIAGNOSIS — M7062 Trochanteric bursitis, left hip: Secondary | ICD-10-CM | POA: Diagnosis not present

## 2022-10-04 DIAGNOSIS — M1612 Unilateral primary osteoarthritis, left hip: Secondary | ICD-10-CM | POA: Diagnosis not present

## 2022-10-28 DIAGNOSIS — A6000 Herpesviral infection of urogenital system, unspecified: Secondary | ICD-10-CM | POA: Diagnosis not present

## 2022-10-28 DIAGNOSIS — N83202 Unspecified ovarian cyst, left side: Secondary | ICD-10-CM | POA: Diagnosis not present

## 2022-11-06 ENCOUNTER — Ambulatory Visit (INDEPENDENT_AMBULATORY_CARE_PROVIDER_SITE_OTHER): Payer: Medicare Other | Admitting: Family Medicine

## 2022-11-06 ENCOUNTER — Encounter: Payer: Self-pay | Admitting: Family Medicine

## 2022-11-06 VITALS — BP 140/75 | HR 63 | Resp 18 | Ht 66.0 in | Wt 131.0 lb

## 2022-11-06 DIAGNOSIS — L989 Disorder of the skin and subcutaneous tissue, unspecified: Secondary | ICD-10-CM

## 2022-11-06 NOTE — Progress Notes (Signed)
   Acute Office Visit  Subjective:     Patient ID: Joy Williams, female    DOB: 11-May-1951, 72 y.o.   MRN: [id number]  Chief Complaint  Patient presents with   Skin Problem    Right upper back     HPI Patient is in today for a skin tag on her right upper back that has become irritated.  The spot is right along her bra line and is constantly getting caught.  She only first noticed this for about 3 weeks ago and it has been irritating since then.  As far she knows, the spot did not exist prior.  ROS Negative unless otherwise noted in HPI    Objective:    BP (!) 140/75 (BP Location: Left Arm, Patient Position: Sitting, Cuff Size: Normal)   Pulse 63   Resp 18   Ht 5\' 6"  (1.676 m)   Wt 131 lb (59.4 kg)   SpO2 100%   BMI 21.14 kg/m   Physical Exam Constitutional:      General: She is not in acute distress.    Appearance: Normal appearance.  HENT:     Head: Normocephalic and atraumatic.  Pulmonary:     Effort: Pulmonary effort is normal. No respiratory distress.  Musculoskeletal:     Cervical back: Normal range of motion.  Skin:    General: Skin is warm and dry.     Coloration: Skin is not cyanotic, jaundiced or pale.          Comments: Soft, tan, raised 0.5 x 0.5 cm  lesion. Nontender, no bleeding or petechiae, no discharge.   Neurological:     General: No focal deficit present.     Mental Status: She is alert and oriented to person, place, and time. Mental status is at baseline.  Psychiatric:        Mood and Affect: Mood normal.        Thought Content: Thought content normal.        Judgment: Judgment normal.      Assessment & Plan:  Skin lesion of back -     Ambulatory referral to Dermatology  Skin lesion has the appearance of a skin tag.  No crusting, not vesicular, no discharge.  Since it is a new spot, I do want to be sure that there is no risk of malignancy and recommend skin biopsy.  Our office is too small to keep the supplies on hand to send skin  biopsies to the lab, which I discussed with the patient.  She will need to go to dermatology to have the skin biopsy performed.  Patient verbalized understanding and is agreeable to this plan.  She also states that she is going to do Cologuard before she sees me at her next appointment on 03/06/2023.  Return if symptoms worsen or fail to improve.  Melida Quitter, PA

## 2022-11-12 DIAGNOSIS — N83209 Unspecified ovarian cyst, unspecified side: Secondary | ICD-10-CM | POA: Diagnosis not present

## 2022-11-13 NOTE — Progress Notes (Unsigned)
Cardiology Office Note:    Date:  11/18/2022   ID:  Joy Williams, DOB 02-16-1951, MRN [id number]  PCP:  Melida Quitter, PA   [facility] Providers Cardiologist:  [name], MD {   Referring MD: No ref. provider found    History of Present Illness:    Joy Williams is a 72 y.o. female with a hx of MVP s/p minimally invasive bioprosthetic MVR, carotid artery disease s/p R CEA, scleroderma, cold agglutinin disease, raynaud's disease, UIP, NSCLC, COPD, hypothyroidism and recent admission for STEMI found to have clean coronaries on cath with diagnosis of Takostubo CM discharged on 10/29/20 and recent readmission for dizziness on 11/03/20 who now presents to clinic for follow-up.  Patient was admitted 02/24/19-02/25/19 with chest discomfort.  High-sensitivity troponin levels were elevated (1471 >> 1625 >> 1246 >> 1150).  There was concern for NSTEMI., however, cath demonstrated no CAD.  TTE demonstrated normal LV function and normally functioning mitral valve prosthesis.  She was followed by Dr. Rennis Golden in the hospital and he felt that her presentation was c/w [facility] (myocardial infarction with non-obstructive coronary arteries) or non-ACS troponin elevation.  The possible causes included myocarditis, coronary spasm, cardioembolic event. Underwent cardiac MRI that showed focal subendocardial LGE into mid inferolateral wall consistent with infarct seen on territory with concern for a thromboembolic event given clean cath. She was managed medically and worked with cardiac rehab at that time.   In November of 2020, she developed palpitations. 4 week zio placed with brief runs of SVT but no significant arrhythmias or Afib   Re-admitted to [facility] from 08/06/20-08/10/20 where she presented for acute onset dizziness, left arm incoordination and HA. CT head negative for acute abnormalities. MRI brain without acute infarct but showed multiple scattered remote lacunar infarcts. 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 TTE with EF 65 to 70%, NWMA, mild LVH, moderate left atrial size dilated, mild MVR, no source of emboli noted. She underwent right CEA on 08/09/20 with Dr. Randie Heinz. She was continued on ASA, plavix and statin.  Re-admitted again from 10/24/20-10/29/20 where she presented with chest pain and nausea found to have STE in I and aVL for which a STEMI was called. She was taken to the cath lab which showed 25% pLAD stenosis, otherwise normal coronary arteries, and EF 25-35% with LV gram c/w takotsubo cardiomyopathy. She was started on plavix. TTE showed EF 25-30%, global hypokinesis, G1DD, moderately elevated PA pressures, moderate LAE, and normal structure/function of mitral valve s/p repair/replacement. GDMT was limited due to hypotension and she was discharged on 10/29/20.  Re-presented to the ER on 11/03/20 with dizziness and orthostatic symptoms. Trop 41-->44 which was down from prior admission. Symptoms thought to be due to orthostasis and farxiga discontinued.  Saw [facility] on 11/06/20 where she was doing better. Dizziness improved. Bedside echo with improved LVEF.  She was seen 11/15/2020 where she was feeling much improved since last admission.  Repeat echo 11/22/2020 with LVEF returned to normal 60 to 65%, mild LVH, trivial central MR with no stenosis.  Last seen in clinic on 06/2021 where she was doing well from a CV standpoint. TTE 12/04/21 with LVEF 60-65%, Normal RV, moderate LAE, normal functioning MV prosthesis with mean gradient 2.41mmHG at HR 63bpm.  Has been followed by Pulmonary (Dr. 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  Was last seen in clinic 02/2022 where she was doing well from a CV standpoint.  Today, the patient overall feels well.  No chest pain, SOB,  orthopnea or PND. Participating in water aerobics 2x/week. Blood pressure is controlled. Tolerating medications as prescribed.    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 [facility] (had to have botox injection)   COPD (chronic obstructive pulmonary disease) (HCC)    Family history of adverse reaction to anesthesia    Mother- very sensitive to medication   Heart murmur    MVP   Hypothyroidism    Lung cancer (HCC) 12/30/2014   Synchronous primaries:  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, each treated by wedge resection with post-op adjuvant chemoRx [facility]   MVP (mitral valve prolapse) 2018   s/p Minimally-Invasive Mitral Valve Replacement w/  [facility] Mitral bovine bioprosthetic tissue valve (size 31mm, model # 7300TFX, serial # [id number])   PONV (postoperative nausea and vomiting)    Raynaud's syndrome    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   31 mm [facility] mitral bovine bioprosthetic tissue valve placed via right mini thoracotomy approach   Severe mitral regurgitation 11/15/2014   Shortness of breath dyspnea    with exertion   STD (sexually transmitted disease)    STEMI (ST elevation myocardial infarction) (HCC) 10/24/2020   Telangiectasia     Past Surgical History:  Procedure Laterality Date   BACK SURGERY     x 3  Disectomy   BREAST BIOPSY Left    CARDIAC CATHETERIZATION N/A 03/08/2016   Procedure: Right/Left Heart Cath and Coronary Angiography;  Surgeon: Tonny Bollman, MD;  Location: [facility] INVASIVE CV LAB;  Service: Cardiovascular;  Laterality: N/A;   CLAVICLE SURGERY Left 2013   plate to left collar bone   ENDARTERECTOMY Right 08/09/2020    Procedure: RIGHT CAROTID ENDARTERECTOMY;  Surgeon: Maeola Harman, MD;  Location: [facility] OR;  Service: Vascular;  Laterality: Right;   LAPAROSCOPY     ? reason-age 88    LEFT HEART CATH AND CORONARY ANGIOGRAPHY N/A 02/25/2019   Procedure: LEFT HEART CATH AND CORONARY ANGIOGRAPHY;  Surgeon: Corky Crafts, MD;  Location: [facility] INVASIVE CV LAB;  Service: Cardiovascular;  Laterality: N/A;   LEFT HEART CATH AND CORONARY ANGIOGRAPHY N/A 10/24/2020   Procedure: LEFT HEART CATH AND CORONARY ANGIOGRAPHY;  Surgeon: Corky Crafts, MD;  Location: [facility] INVASIVE CV LAB;  Service: Cardiovascular;  Laterality: N/A;   LUNG CANCER SURGERY     MITRAL VALVE REPAIR Right 06/27/2016   Procedure: MINIMALLY INVASIVE MITRAL VALVE REPLACEMENT;  Surgeon: Purcell Nails, MD;  Location: [facility] OR;  Service: Open Heart Surgery;  Laterality: Right;   TEE WITHOUT CARDIOVERSION N/A 02/22/2016   Procedure: TRANSESOPHAGEAL ECHOCARDIOGRAM (TEE);  Surgeon: Lars Masson, MD;  Location: [facility] ENDOSCOPY;  Service: Cardiovascular;  Laterality: N/A;   TEE WITHOUT CARDIOVERSION N/A 06/27/2016   Procedure: TRANSESOPHAGEAL ECHOCARDIOGRAM (TEE);  Surgeon: Purcell Nails, MD;  Location: [facility] OR;  Service: Open Heart Surgery;  Laterality: N/A;  TONSILLECTOMY     VIDEO ASSISTED THORACOSCOPY (VATS)/WEDGE RESECTION Left 12/30/2014   Bronchoscopy, Mediastinoscopy, Left VATS for Wedge resection LUL x2 adn LLL x1 - Dr. Ewing Schlein at [facility]   VOCAL CORD INJECTION Left 2017   injected with botox   wrist surgery Left 2015   plate to wrist     Current Medications: Current Meds  Medication Sig   acetaminophen (TYLENOL) 500 MG tablet Take 1,000 mg by mouth every 6 (six) hours as needed for mild pain or headache.   azithromycin (ZITHROMAX) 250 MG tablet z-pack - take as directed for 5 days   Cholecalciferol (VITAMIN D) 125 MCG (5000 UT) CAPS Take 5,000 Units by mouth daily.   clopidogrel (PLAVIX) 75 MG tablet Take 1 tablet (75 mg total) by  mouth daily.   EPINEPHrine (EPIPEN 2-PAK) 0.3 mg/0.3 mL IJ SOAJ injection Inject 0.3 mg into the muscle as needed for anaphylaxis.   ferrous sulfate 325 (65 FE) MG tablet Take 1 tablet (325 mg total) by mouth daily.   hydrALAZINE (APRESOLINE) 25 MG tablet Take 1 tablet (25 mg total) by mouth daily as needed (for systolic BP>150).   levothyroxine (SYNTHROID) 175 MCG tablet Take 1 tablet po QAM   MELATONIN PO Take 1 tablet by mouth at bedtime as needed (sleep).   Multiple Vitamins-Minerals (MULTIVITAMIN WITH MINERALS) tablet Take 1 tablet by mouth daily.   TRELEGY ELLIPTA 100-62.5-25 MCG/ACT AEPB INHALE 1 PUFF BY MOUTH EVERY DAY   Turmeric 500 MG CAPS Take 500 mg by mouth daily.   vitamin B-12 (CYANOCOBALAMIN) 1000 MCG tablet Take 1 tablet (1,000 mcg total) by mouth daily.   [DISCONTINUED] rosuvastatin (CRESTOR) 20 MG tablet Take 1 tablet (20 mg total) by mouth every evening.     Allergies:   Penicillins and Bee venom   Social History   Socioeconomic History   Marital status: Divorced    Spouse name: Not on file   Number of children: Not on file   Years of education: 16   Highest education level: Not on file  Occupational History   Occupation: Retired  Tobacco Use   Smoking status: Some Days    Packs/day: 1.00    Years: 24.00    Additional pack years: 0.00    Total pack years: 24.00    Types: Cigarettes    Start date: 05/21/1983    Passive exposure: Never   Smokeless tobacco: Never   Tobacco comments:    Currently smoking 5 cigs a day as of 03/18/22 ep  Vaping Use   Vaping Use: Never used  Substance and Sexual Activity   Alcohol use: Yes    Alcohol/week: 2.0 standard drinks of alcohol    Types: 1 Shots of liquor, 1 Standard drinks or equivalent per week    Comment: occ   Drug use: No   Sexual activity: Not Currently    Partners: Male    Birth control/protection: Post-menopausal  Other Topics Concern   Not on file  Social History Narrative   Not on file   Social  Determinants of Health   Financial Resource Strain: Low Risk  (09/04/2022)   Overall Financial Resource Strain (CARDIA)    Difficulty of Paying Living Expenses: Not hard at all  Food Insecurity: No Food Insecurity (09/04/2022)   Hunger Vital Sign    Worried About Running Out of Food in the Last Year: Never true    Ran Out of Food in the Last Year: Never true  Transportation Needs: No Transportation Needs (09/04/2022)  PRAPARE - Administrator, Civil Service (Medical): No    Lack of Transportation (Non-Medical): No  Physical Activity: Insufficiently Active (09/04/2022)   Exercise Vital Sign    Days of Exercise per Week: 4 days    Minutes of Exercise per Session: 30 min  Stress: No Stress Concern Present (09/04/2022)   [employer] of Occupational Health - Occupational Stress Questionnaire    Feeling of Stress : Not at all  Social Connections: Moderately Integrated (09/04/2022)   Social Connection and Isolation Panel [NHANES]    Frequency of Communication with Friends and Family: More than three times a week    Frequency of Social Gatherings with Friends and Family: Twice a week    Attends Religious Services: More than 4 times per year    Active Member of [employer] or Organizations: Yes    Attends Engineer, structural: More than 4 times per year    Marital Status: Divorced     Family History: The patient's family history includes Dementia in her father; Healthy in her daughter and son; Mitral valve prolapse in her brother, mother, and sister; Prostate cancer in her father. There is no history of Colon cancer, Colon polyps, Rectal cancer, Esophageal cancer, Stomach cancer, or Heart attack.  ROS:   Please see the history of present illness.       EKGs/Labs/Other Studies Reviewed:    The following studies were reviewed today: Cardiac Studies & Procedures   CARDIAC CATHETERIZATION  CARDIAC CATHETERIZATION 10/24/2020  Narrative  Prox LAD lesion is 25% stenosed.  No significant CAD.  The left ventricular systolic function is normal.  LV end diastolic pressure is mildly elevated.  The left ventricular ejection fraction is 25-35% by visual estimate.  There is no aortic valve stenosis.  Prior mitral valve surgery.  Despite lateral ST elevation, we only found nonobstructive coronary artery disease.  Left ventriculogram fits with a pattern of Takotsubo cardiomyopathy.  LV function significantly different from her prior catheterization.  She will need aggressive medical therapy for LV dysfunction.  I discussed the findings with her daughter.  Findings Coronary Findings Diagnostic  Dominance: Right  Left Anterior Descending Vessel is angiographically normal. The LAD is a large vessel with no obstruction. The vessel wraps around the LV apex. The first diagonal is a large vessel with no disease noted. Vessel is angiographically normal. Prox LAD lesion is 25% stenosed.  Left Circumflex Vessel is angiographically normal. The circumflex is widely patent and smooth throughout. There is no obstructive disease. The first OM is tiny. The second OM supplies twin branches to the posterolateral wall.  Right Coronary Artery Vessel is angiographically normal. Large, dominant vessel. The vessel is smooth throughout with no evidence of atherosclerotic disease. The PDA and PLA branches are patent.  Intervention  No interventions have been documented.   CARDIAC CATHETERIZATION  CARDIAC CATHETERIZATION 02/25/2019  Narrative  The left ventricular systolic function is normal.  LV end diastolic pressure is normal.  The left ventricular ejection fraction is 55-65% by visual estimate.  There is no aortic valve stenosis.  No angiographically apparent coronary artery disease.  No angiographically apparent coronary artery disease.  Further imaging per Dr. Rennis Golden.  Results communicated to her daughter, Judeth Cornfield.  Findings Coronary Findings Diagnostic   Dominance: Right  Left Anterior Descending Vessel is angiographically normal. The LAD is a large vessel with no obstruction. The vessel wraps around the LV apex. The first diagonal is a large vessel with no disease noted. Vessel is  angiographically normal.  Left Circumflex Vessel is angiographically normal. The circumflex is widely patent and smooth throughout. There is no obstructive disease. The first OM is tiny. The second OM supplies twin branches to the posterolateral wall.  Right Coronary Artery Vessel is angiographically normal. Large, dominant vessel. The vessel is smooth throughout with no evidence of atherosclerotic disease. The PDA and PLA branches are patent.  Intervention  No interventions have been documented.     ECHOCARDIOGRAM  ECHOCARDIOGRAM COMPLETE 12/04/2021  Narrative ECHOCARDIOGRAM REPORT    Patient Name:   Joy Williams Date of Exam: 12/04/2021 Medical Rec #:  [id number]        Height:       66.5 in Accession #:    [id number]       Weight:       138.0 lb Date of Birth:  12/23/1950        BSA:          1.718 m Patient Age:    70 years         BP:           122/74 mmHg Patient Gender: F                HR:           66 bpm. Exam Location:  [address]  Procedure: 2D Echo, 3D Echo, Cardiac Doppler, Color Doppler and Strain Analysis  Indications:    I05.9 Mitral Valve Disorder  History:        Patient has prior history of Echocardiogram examinations, most recent 11/22/2020. STEMI, s/p Minimally-Invasive Mitral Valve Replacement w/ [facility] Mitral bovine bioprosthetic tissue valve (size 31mm, model # 7300TFX, serial # [id number]), COPD; Signs/Symptoms:Murmur.  Mitral Valve: 31 mm Edwards Magna bovine bioprosthetic tissue valve valve is present in the mitral position.  Sonographer:    Daphine Deutscher RDCS Referring Phys: [id number] Giovanni Biby E Caison [place]  IMPRESSIONS   1. Left ventricular ejection fraction, by estimation, is 60 to 65%. Left  ventricular ejection fraction by 3D volume is 61%. The left ventricle has normal function. The left ventricle has no regional wall motion abnormalities. Diastolic function indeterminant due to MVR. The average left ventricular global longitudinal strain is -17.1 %. The global longitudinal strain is abnormal and mildly reduced. 2. Right ventricular systolic function is normal. The right ventricular size is normal. 3. Left atrial size was moderately dilated. 4. The mitral valve has been repaired/replaced. There is a 31 mm Edwards Magna bovine bioprosthetic tissue valve present in the mitral position. Echo findings are consistent with normal structure and function of the mitral valve prosthesis. Mean gradient 2.55mmHg at HR 63bpm. Trivial central mitral regurgitation. 5. The aortic valve is tricuspid. There is mild calcification of the aortic valve. There is mild thickening of the aortic valve. Aortic valve regurgitation is not visualized. Aortic valve sclerosis/calcification is present, without any evidence of aortic stenosis. 6. The inferior vena cava is normal in size with greater than 50% respiratory variability, suggesting right atrial pressure of 3 mmHg.  Comparison(s): No significant change from prior study.  FINDINGS Left Ventricle: Left ventricular ejection fraction, by estimation, is 60 to 65%. Left ventricular ejection fraction by 3D volume is 61 %. The left ventricle has normal function. The left ventricle has no regional wall motion abnormalities. The average left ventricular global longitudinal strain is -17.1 %. The global longitudinal strain is abnormal. The left ventricular internal cavity size was normal in size. There is no left ventricular hypertrophy.  Abnormal (paradoxical) septal motion consistent with post-operative status. Diastolic function indeterminant due to MVR.  Right Ventricle: The right ventricular size is normal. No increase in right ventricular wall thickness. Right  ventricular systolic function is normal.  Left Atrium: Left atrial size was moderately dilated.  Right Atrium: Right atrial size was normal in size.  Pericardium: There is no evidence of pericardial effusion.  Mitral Valve: The mitral valve has been repaired/replaced. Trivial mitral valve regurgitation. There is a 31 mm Edwards Magna bovine bioprosthetic tissue valve present in the mitral position. Echo findings are consistent with normal structure and function of the mitral valve prosthesis. MV peak gradient, 5.7 mmHg. The mean mitral valve gradient is 2.3 mmHg.  Tricuspid Valve: The tricuspid valve is normal in structure. Tricuspid valve regurgitation is trivial.  Aortic Valve: The aortic valve is tricuspid. There is mild calcification of the aortic valve. There is mild thickening of the aortic valve. Aortic valve regurgitation is not visualized. Aortic valve sclerosis/calcification is present, without any evidence of aortic stenosis.  Pulmonic Valve: The pulmonic valve was normal in structure. Pulmonic valve regurgitation is trivial.  Aorta: The aortic root and ascending aorta are structurally normal, with no evidence of dilitation.  Venous: The inferior vena cava is normal in size with greater than 50% respiratory variability, suggesting right atrial pressure of 3 mmHg.  IAS/Shunts: The atrial septum is grossly normal.   LEFT VENTRICLE PLAX 2D LVIDd:         3.80 cm         Diastology LVIDs:         2.60 cm         LV e' medial:    4.90 cm/s LV PW:         0.90 cm         LV E/e' medial:  19.4 LV IVS:        0.90 cm         LV e' lateral:   8.16 cm/s LVOT diam:     1.80 cm         LV E/e' lateral: 11.6 LV SV:         65 LV SV Index:   38              2D LVOT Area:     2.54 cm        Longitudinal Strain 2D Strain GLS  -16.3 % (A2C): 2D Strain GLS  -18.4 % (A3C): 2D Strain GLS  -16.6 % (A4C): 2D Strain GLS  -17.1 % Avg:  3D Volume EF LV 3D EF:     Left ventricul ar ejection fraction by 3D volume is 61 %.  3D Volume EF: 3D EF:        61 % LV EDV:       104 ml LV ESV:       40 ml LV SV:        63 ml  RIGHT VENTRICLE             IVC RV Basal diam:  3.00 cm     IVC diam: 0.80 cm RV S prime:     11.37 cm/s TAPSE (M-mode): 1.7 cm  LEFT ATRIUM             Index        RIGHT ATRIUM          Index LA diam:        4.40 cm 2.56 cm/m  RA Area:     9.98 cm LA Vol (A2C):   54.1 ml 31.50 ml/m  RA Volume:   21.20 ml 12.34 ml/m LA Vol (A4C):   58.6 ml 34.12 ml/m LA Biplane Vol: 60.6 ml 35.28 ml/m AORTIC VALVE LVOT Vmax:   117.50 cm/s LVOT Vmean:  76.700 cm/s LVOT VTI:    0.254 m  AORTA Ao Root diam: 3.30 cm Ao Asc diam:  3.40 cm  MITRAL VALVE               TRICUSPID VALVE MV Area (PHT): 3.27 cm    TR Peak grad:   22.3 mmHg MV Area VTI:   1.82 cm    TR Vmax:        236.00 cm/s MV Peak grad:  5.7 mmHg MV Mean grad:  2.3 mmHg    SHUNTS MV Vmax:       1.19 m/s    Systemic VTI:  0.25 m MV Vmean:      70.7 cm/s   Systemic Diam: 1.80 cm MV Decel Time: 232 msec MV E velocity: 95.00 cm/s MV A velocity: 75.10 cm/s MV E/A ratio:  1.26  Laurance Flatten MD Electronically signed by Laurance Flatten MD Signature Date/Time: 12/04/2021/1:25:02 PM    Final   TEE  ECHO TEE 06/27/2016  Interpretation Summary  Left ventricle: Normal cavity size and wall thickness. LV systolic function is normal with an EF of 60-65%. There are no obvious wall motion abnormalities. No thrombus present. No mass present.  Septum: No Patent Foramen Ovale present. The interatrial septum bows to the left consistent with high right atrial pressure.  Left atrium: Patent foramen ovale not present.  Aortic valve: The valve is trileaflet. No stenosis. Trace regurgitation. No AV vegetation.  Mitral valve: Leaflets appear myxomatous. Severe leaflet thickening is present. Moderate leaflet calcification is present. Moderate mitral annular calcification.  Mild stenosis. Severe regurgitation. There is prolapse of the medial segment (A3) and the middle segment (A2) of the anterior mitral leaflet. There is prolapse of the medial scallop and the middle scallop of the posterior mitral leaflet.  Right ventricle: Normal cavity size, wall thickness and ejection fraction.  Tricuspid valve: Trace regurgitation. The tricuspid valve regurgitation jet is central.  Pulmonic valve: Trace regurgitation.  Mitral valve: There is a bioprosthetic mitral valve.   MONITORS  LONG TERM MONITOR (3-14 DAYS) 05/17/2019  Narrative  Sinus bradycardia to sinus tachycardia.  No atrial fibrillation.  3 runs of nonsustained ventricular tachycardia's with longest lasting 7 beats.  15 very short runs of SVTs, most probably atrial tachycardia.  Patient had a min HR of 53 bpm, max HR of 182 bpm, and avg HR of 73 bpm. Predominant underlying rhythm was Sinus Rhythm.  There is no evidence for atrial fibrillation. 3 Ventricular Tachycardia runs, the longest lasting 7 beats with an avg rate of 105 bpm.    CARDIAC MRI  MR CARDIAC MORPHOLOGY W WO CONTRAST 03/18/2019  Narrative CLINICAL DATA:  Elevated troponin, normal coronaries.  EXAM: CARDIAC MRI  TECHNIQUE: The patient was scanned on a 1.5 Tesla GE magnet. A dedicated cardiac coil was used. Functional imaging was done using Fiesta sequences. 2,3, and 4 chamber views were done to assess for RWMA's. Modified Simpson's rule using a short axis stack was used to calculate an ejection fraction on a dedicated work Research officer, trade [employer]. The patient received 7 cc of Gadavist. After 10 minutes inversion recovery sequences were used to assess for infiltration and scar tissue.  FINDINGS: Limited images of the lung fields showed mild basal atelectasis.  Normal left ventricular size and wall thickness. Mid inferolateral mild hypokinesis, the remainder of the wall segments thickened normally. EF 73%. Normal  right ventricular size and systolic function, EF 51%. Mild left atrial enlargement. Normal right atrial size. There was a bioprosthetic mitral valve present. Unable to comment on mitral regurgitation due to artifact from valve. The aortic valve was poorly visualized due to artifact from the bioprosthetic mitral valve.  On delayed enhancement imaging, there was focal subendocardial 51-75% wall thickness late gadolinium enhancement in the mid inferolateral wall.  Measurements:  LVEDV 81 mL  LVSV 59 mL  LVEF 73%  RVEDV 93 mL  RVSV 48 mL  RVEF 51%  IMPRESSION: 1. Normal LV size and systolic function, EF 73%. Mid inferolateral wall mild hypokinesis.  2.  Normal RV size and systolic function, EF 51%.  3. Bioprosthetic mitral valve, unable to comment on this due to artifact.  4. Delayed enhancement pattern is suggestive of focal infarct (subendocardial LGE) in left circumflex territory (?OM territory).  Dalton Mclean   Electronically Signed By: Marca Ancona M.D. On: 03/18/2019 15:06          EKG:  No new tracing  Recent Labs: 03/18/2022: Pro B Natriuretic peptide (BNP) 109.0 08/28/2022: ALT 26; BUN 12; Creatinine, Ser 0.68; Hemoglobin 13.6; Platelets 216; Potassium 4.5; Sodium 134; TSH 4.970  Recent Lipid Panel    Component Value Date/Time   CHOL 142 08/28/2022 0831   CHOL 177 10/28/2014 1025   TRIG 112 08/28/2022 0831   TRIG 115 10/28/2014 1025   HDL 67 08/28/2022 0831   HDL 70 10/28/2014 1025   CHOLHDL 2.1 08/28/2022 0831   CHOLHDL 2.3 10/24/2020 1545   VLDL 20 10/24/2020 1545   LDLCALC 55 08/28/2022 0831   LDLCALC 84 10/28/2014 1025   LDLDIRECT 63 06/15/2020 1446         Physical Exam:    VS:  BP 110/66   Pulse 68   Ht 5\' 6"  (1.676 m)   Wt 131 lb (59.4 kg)   SpO2 99%   BMI 21.14 kg/m     Wt Readings from Last 3 Encounters:  11/18/22 131 lb (59.4 kg)  11/06/22 131 lb (59.4 kg)  09/04/22 130 lb (59 kg)     GEN: Comfortable, NAD HEENT:  Normal NECK: No JVD; No carotid bruits CARDIAC: RRR,  1/6 systolic murmur RESPIRATORY:   Coarse crackles at the bases (unchanged). Otherwise clear. ABDOMEN: Soft, non-tender, non-distended MUSCULOSKELETAL:  No edema; No deformity  SKIN: Warm and dry NEUROLOGIC:  Alert and oriented x 3 PSYCHIATRIC:  Normal affect   ASSESSMENT:    1. Chronic systolic heart failure (HCC)   2. Hyperlipidemia LDL goal <70   3. S/P mitral valve replacement with bioprosthetic valve   4. Nonobstructive atherosclerosis of coronary artery   5. Takotsubo cardiomyopathy   6. TIA (transient ischemic attack)   7. Bilateral carotid artery stenosis   8. Orthostatic hypertension   9. History of mitral valve replacement   10. Mitral valve insufficiency, unspecified etiology    PLAN:    In order of problems listed above:  #Non-Ischemic CM:  #Takotsubo cardiomyopathy:  Patient had a recent admission from 10/24/20-10/29/20 where she presented with chest pain, nausea, and vomiting and found to have anterolateral STE on EKG. HsTrop increased from [id number]>4990. She underwent emergent cardiac catheterization which showed 25% pLAD stenosis, otherwise normal coronary arteries, and EF 25-35% with LV gram  c/w takotsubo cardiomyopathy. She was started on plavix for conservative management. TTE showed EF 25-30%, global hypokinesis, G1DD, moderately elevated PA pressures, moderate LAE, and normal structure/function of mitral valve s/p repair/replacement. GDMT has been limited by symptomatic hypotension. Repeat echo 11/22/2020 with LVEF returned to normal 60 to 65%, mild LVH, trivial central MR with no stenosis. -TTE with improved EF 60-65% - Unable to tolerate GDMT due to symptomatic hypotension - Continue plavix 75mg  daily (was on previously due to suspected thromboembolic NSTEMI in the past) - Monitor daily weights  #MVP c/b severe mitral regurgitation s/p bioprosthetic MVR in 2018:  Stable gradients on most recent TTE with mean  gradient 2.9mmHg. Trivial MR. - Continue routine monitoring with serial echoes   #History of NSTEMI (non-ST elevated myocardial infarction)  Presented with chest pain in 2020. Cath without obstructive disease, however, MRI showed focal LGE in the mid inferolateral wall highly suspicious for thromboembolic event. TTE with LVEF was 60 to 65%. Completed cardiac rehab. Had mild troponin elevation with recent hospitalization with peak 140-->130. No anginal symptoms. Suspect demand in the setting of TIA. Given recent negative cath and absence of chest pain, no further work-up needed at this time. -Continue plavix 75mg  daily (favored over ASA given concern for TIA at that time) -Continue crestor 20mg  daily -Off BB due to orthostatsis -Stopped linisopril due to orthostasis  #Episodic HTN: Likely driven by increased salt intake. Due to orthostatic hypotension, she was unable to tolerate BP meds. Will give prn hydralazine as needed. Discussed low Na diet and only use hydralazine if SBP persistently above 150s. -Start hydralazine 25mg  prn for SBP>150 -Discussed low Na diet at length -Cannot tolerate scheduled HTN med due to orthostasis.  #Orthostatic Hypotension: Improved with cessation of antihypertensives and jardiance. -Compression socks -Elevate legs at end of the day -Slow position changes   #Carotid artery disease s/p right CEA 07/2020: followed by Dr. Randie Heinz outpatient - Repeat carotid ultrasound for monitoring - She will schedule with Dr. Randie Heinz for follow-up - Continue plavix 75mg  daily and crestor 20mg  daily as above   #Iron deficiency:  - Continue po iron - Follow-up with PCP for further management.     #UIP:  - Continue home trelegy - Continue to follow with pulmonology   #Hyperlipidemia LDL goal <70 LDL optimal on most recent lab work with LDL 63   -Continue crestor 20mg  daily  #Scleroderma with CREST: #Pulmonary Fibrosis: Most recent TTE with normal RV. Unable to assess PASP  due to inadequate TR jet. -Follows with Pulm and Rheum -Continue yearly monitoring of TTEs for RV and PASP  #Tobacco Abuse: Quit successfully. -Continue nicotine patches  Follow Up: 6 Months.  Medication Adjustments/Labs and Tests Ordered: Current medicines are reviewed at length with the patient today.  Concerns regarding medicines are outlined above.  Orders Placed This Encounter  Procedures   ECHOCARDIOGRAM COMPLETE   VAS US CAROTID   Meds ordered this encounter  Medications   rosuvastatin (CRESTOR) 20 MG tablet    Sig: Take 1 tablet (20 mg total) by mouth every evening.    Dispense:  90 tablet    Refill:  3    Keep upcoming appt on 11/18/22   Patient Instructions  Medication Instructions:  Your physician recommends that you continue on your current medications as directed. Please refer to the Current Medication list given to you today.  *If you need a refill on your cardiac medications before your next appointment, please call your pharmacy*    Testing/Procedures: Your  physician has requested that you have an echocardiogram. Echocardiography is a painless test that uses sound waves to create images of your heart. It provides your doctor with information about the size and shape of your heart and how well your heart's chambers and valves are working. This procedure takes approximately one hour. There are no restrictions for this procedure. Please do NOT wear cologne, perfume, aftershave, or lotions (deodorant is allowed). Please arrive 15 minutes prior to your appointment time.  Your physician has requested that you have a carotid duplex. This test is an ultrasound of the carotid arteries in your neck. It looks at blood flow through these arteries that supply the brain with blood. Allow one hour for this exam. There are no restrictions or special instructions.      Follow-Up: At [facility], you and your health needs are our priority.  As part of our continuing  mission to provide you with exceptional heart care, we have created designated Provider Care Teams.  These Care Teams include your primary Cardiologist (physician) and Advanced Practice Providers (APPs -  Physician Assistants and Nurse Practitioners) who all work together to provide you with the care you need, when you need it.    Your next appointment:   6 month(s)  Provider:   Dr. Jacques Navy      Signed, [name], MD  11/18/2022 12:13 PM

## 2022-11-18 ENCOUNTER — Ambulatory Visit: Payer: Medicare Other | Attending: Cardiology | Admitting: Cardiology

## 2022-11-18 ENCOUNTER — Encounter: Payer: Self-pay | Admitting: Cardiology

## 2022-11-18 VITALS — BP 110/66 | HR 68 | Ht 66.0 in | Wt 131.0 lb

## 2022-11-18 DIAGNOSIS — N83202 Unspecified ovarian cyst, left side: Secondary | ICD-10-CM | POA: Diagnosis not present

## 2022-11-18 DIAGNOSIS — E785 Hyperlipidemia, unspecified: Secondary | ICD-10-CM | POA: Diagnosis not present

## 2022-11-18 DIAGNOSIS — I2510 Atherosclerotic heart disease of native coronary artery without angina pectoris: Secondary | ICD-10-CM | POA: Insufficient documentation

## 2022-11-18 DIAGNOSIS — I10 Essential (primary) hypertension: Secondary | ICD-10-CM | POA: Diagnosis not present

## 2022-11-18 DIAGNOSIS — Z952 Presence of prosthetic heart valve: Secondary | ICD-10-CM | POA: Insufficient documentation

## 2022-11-18 DIAGNOSIS — G459 Transient cerebral ischemic attack, unspecified: Secondary | ICD-10-CM | POA: Insufficient documentation

## 2022-11-18 DIAGNOSIS — I340 Nonrheumatic mitral (valve) insufficiency: Secondary | ICD-10-CM | POA: Insufficient documentation

## 2022-11-18 DIAGNOSIS — Z953 Presence of xenogenic heart valve: Secondary | ICD-10-CM | POA: Diagnosis not present

## 2022-11-18 DIAGNOSIS — N83209 Unspecified ovarian cyst, unspecified side: Secondary | ICD-10-CM | POA: Diagnosis not present

## 2022-11-18 DIAGNOSIS — I5181 Takotsubo syndrome: Secondary | ICD-10-CM | POA: Insufficient documentation

## 2022-11-18 DIAGNOSIS — I6523 Occlusion and stenosis of bilateral carotid arteries: Secondary | ICD-10-CM | POA: Diagnosis not present

## 2022-11-18 DIAGNOSIS — I5022 Chronic systolic (congestive) heart failure: Secondary | ICD-10-CM | POA: Diagnosis not present

## 2022-11-18 MED ORDER — ROSUVASTATIN CALCIUM 20 MG PO TABS
20.0000 mg | ORAL_TABLET | Freq: Every evening | ORAL | 3 refills | Status: DC
Start: 2022-11-18 — End: 2023-12-04

## 2022-11-18 NOTE — Patient Instructions (Signed)
Medication Instructions:  Your physician recommends that you continue on your current medications as directed. Please refer to the Current Medication list given to you today.  *If you need a refill on your cardiac medications before your next appointment, please call your pharmacy*    Testing/Procedures: Your physician has requested that you have an echocardiogram. Echocardiography is a painless test that uses sound waves to create images of your heart. It provides your doctor with information about the size and shape of your heart and how well your heart's chambers and valves are working. This procedure takes approximately one hour. There are no restrictions for this procedure. Please do NOT wear cologne, perfume, aftershave, or lotions (deodorant is allowed). Please arrive 15 minutes prior to your appointment time.  Your physician has requested that you have a carotid duplex. This test is an ultrasound of the carotid arteries in your neck. It looks at blood flow through these arteries that supply the brain with blood. Allow one hour for this exam. There are no restrictions or special instructions.      Follow-Up: At [facility], you and your health needs are our priority.  As part of our continuing mission to provide you with exceptional heart care, we have created designated Provider Care Teams.  These Care Teams include your primary Cardiologist (physician) and Advanced Practice Providers (APPs -  Physician Assistants and Nurse Practitioners) who all work together to provide you with the care you need, when you need it.    Your next appointment:   6 month(s)  Provider:   Dr. Jacques Navy

## 2022-11-19 ENCOUNTER — Encounter: Payer: Self-pay | Admitting: Internal Medicine

## 2022-11-19 ENCOUNTER — Ambulatory Visit (INDEPENDENT_AMBULATORY_CARE_PROVIDER_SITE_OTHER): Payer: Medicare Other | Admitting: Internal Medicine

## 2022-11-19 ENCOUNTER — Encounter: Payer: Medicare Other | Admitting: *Deleted

## 2022-11-19 VITALS — BP 112/74 | HR 71 | Ht 66.5 in | Wt 132.2 lb

## 2022-11-19 DIAGNOSIS — R053 Chronic cough: Secondary | ICD-10-CM | POA: Diagnosis not present

## 2022-11-19 DIAGNOSIS — J849 Interstitial pulmonary disease, unspecified: Secondary | ICD-10-CM

## 2022-11-19 DIAGNOSIS — F17210 Nicotine dependence, cigarettes, uncomplicated: Secondary | ICD-10-CM

## 2022-11-19 DIAGNOSIS — R768 Other specified abnormal immunological findings in serum: Secondary | ICD-10-CM | POA: Diagnosis not present

## 2022-11-19 DIAGNOSIS — Z006 Encounter for examination for normal comparison and control in clinical research program: Secondary | ICD-10-CM

## 2022-11-19 DIAGNOSIS — K521 Toxic gastroenteritis and colitis: Secondary | ICD-10-CM

## 2022-11-19 DIAGNOSIS — Z8679 Personal history of other diseases of the circulatory system: Secondary | ICD-10-CM | POA: Diagnosis not present

## 2022-11-19 DIAGNOSIS — R0609 Other forms of dyspnea: Secondary | ICD-10-CM | POA: Diagnosis not present

## 2022-11-19 DIAGNOSIS — Z85118 Personal history of other malignant neoplasm of bronchus and lung: Secondary | ICD-10-CM

## 2022-11-19 LAB — PULMONARY FUNCTION TEST
DL/VA % pred: 82 %
DL/VA: 3.37 ml/min/mmHg/L
DLCO cor % pred: 53 %
DLCO cor: 11.03 ml/min/mmHg
DLCO unc % pred: 53 %
DLCO unc: 11.03 ml/min/mmHg
FEF 25-75 Pre: 1.6 L/sec
FEF2575-%Pred-Pre: 81 %
FEV1-%Pred-Pre: 75 %
FEV1-Pre: 1.82 L
FEV1FVC-%Pred-Pre: 103 %
FEV6-%Pred-Pre: 75 %
FEV6-Pre: 2.32 L
FEV6FVC-%Pred-Pre: 104 %
FVC-%Pred-Pre: 72 %
FVC-Pre: 2.33 L
Pre FEV1/FVC ratio: 78 %
Pre FEV6/FVC Ratio: 100 %

## 2022-11-19 NOTE — Patient Instructions (Signed)
Spiro/DLCO performed today.  

## 2022-11-19 NOTE — Progress Notes (Signed)
OV 10/12/2020  Subjective:  Patient ID: Joy Williams, female , DOB: 1951-01-02 , age 72 y.o. , MRN: [id number] , ADDRESS: [address] [place] [phone] PCP Mayer Masker, PA-C Patient Care Team: Mayer Masker, PA-C as PCP - General [name], MD as PCP - Cardiology (Cardiology) Lars Masson, MD (Inactive) as Consulting Physician (Cardiology) [name], MD as Consulting Physician (Pulmonary Disease) [name], MD as Consulting Physician (Rheumatology) [name], MD as Referring Physician [name], MD as Referring Physician (Internal Medicine) [name], MD as Consulting Physician (Infectious Diseases)  This Provider for this visit: Treatment Team:  Attending Provider: Kalman Shan, MD    10/12/2020 -   Chief Complaint  Patient presents with   Consult    Pt is being referred to Dr. Marchelle Gearing by Dr. Sherene Sires.  Pt states that she has an occ cough but denies any other complaints.   Transfer of care from Dr. Sandrea Hughs to Dr. Marchelle Gearing in the ILD center.  History is gained from talking to the patient and review of the medical records including [address] and visualization of 2016 or 2017 CT scan of the chest.  HPI Joy Williams 72 y.o. -used to live in [place].  She worked in the [employer] for [employer].  In 2016 she retired and came to [place].  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  Her primary care physician thought she might have crest syndrome because she had Raynaud's along with allergic cases and she had edema in her fingers.  After she moved here apparently she saw a rheumatologist [review of the chart indicates is Dr. Kellie Simmering who has since retired] and was told that she might not have scleroderma.  She has been on observation therapy from a rheumatological standpoint.  In fact never seen a rheumatologist.   Then in 2016 she had early stage non-small cell lung cancer for which she underwent wedge resection left upper lobe.  She is followed up with [facility] since then.  She has had annual CT scans of the chest.  Most recent 1 was in May 2021.  She believes and per record review that she is in complete remission and I believe she might be discharged from follow-up there.  She tells me that at baseline she has very mild shortness of breath that is stable as documented below.  She also has amount of cough that is also documented below.  Is not clear that this is necessarily worse.  She is maintained on inhaled corticosteroid and long-acting beta agonist.  She is not sure if this is because of her diagnose of COPD other than the fact that she is on it for respiratory symptoms.  Most recently when she followed up with Dr. Thurston Hole review of the records revealed that there was interstitial lung disease findings on the CT scan of the chest in May 2021 at [facility].  In my review of the notes it appears that the pattern might have changed.  Infection was present even in 2016 2017 and currently the pattern might be changed.  She is definitely not feeling it.  Her symptoms are minimal and her walking desaturation test is normal.  Because of the ILD changes she has been referred to the ILD center    CT Chest [facility] 09/23/19 at [facility] (no image avail)  Interface, Rad Results In -  09/23/2019 11:22 AM EDT  Formatting of this note might be different from the original.  CT Chest   Indication:  Non-small cell lung cancer, monitor, C34.92 Malignant neoplasm  of unspecified part of left bronchus or lung (CMS-HCC).   Comparison Exams:  06/10/2019   Protocol:  Chest CT WO Protocol.  Contiguous 0.625 mm axial images with 5  mm axial, 3 mm coronal, and 3 mm sagittal reconstructions were obtained  from the neck base through the upper abdomen without IV contrast material.  In addition, 3-D maximal intensity projection  (MIP) reconstructions were  performed to potentially increase the sensitivity for the detection of  pulmonary nodules.   Findings:   The central airways are patent without endobronchial mass. The patient is  status post left upper lobe wedge resection which is stable in appearance.  Status post left lower lobe wedge resection with a similar appearance of  peripheral consolidation and groundglass lung with traction bronchiectasis  adjacent to the staple line. Redemonstrated findings of lower lobe  predominant pulmonary fibrosis with subpleural reticulation and probable  UIP pattern. Stable sub-6 mm pulmonary nodules and nodular opacities are  similar, index 5 mm pulmonary nodule in the anterior pleura of the right  upper lobe (1-610). New left lower lobe nodule on image 4-300 is favored to  represent endoluminal debris. No effusion or pneumothorax. High density  focus along the right diaphragm may represent pleural calcification.  Scattered calcified and noncalcified mediastinal and hilar lymph nodes,  measuring up to 1.3 cm in the left lower paratracheal region   Status post mitral valve are clear with mild cardiomegaly and coronary  artery calcifications in the LAD. No pericardial effusion.   There is a small hiatal hernia no aggressive osseous lesions.   The thyroid gland is unremarkable. No axillary adenopathy or chest wall  abnormality.   Impression:   Stable versus slightly increased in subpleural consolidation in the left  lower lobe adjacent to the surgical staple line. Given curvilinear  appearance on sagittal imaging, findings are favored to represent confluent  fibrosis over malignancy. Consider additional follow-up chest CT in 3-6  months.   Additional nodules/nodular opacities are similar. Attention on follow-up.   Electronically Reviewed by:  Charmayne Sheer, MD, [facility]  Electronically Reviewed on:  09/23/2019 11:14 AM    No results found.   Dec 30 2014 removed LUL lung tissue with 2 tumors/ and L vc paralyzed ever since - last chemo 05/06/15   Last [facility] onc note:  Date of diagnosis: 12/2014 Diagnosis: NSCLC (Synchronous primaries) Histology: Adenocarcinoma Mutation status: EGFR pending, ALK, ROS1 negative Stage: Adenocarcinoma: T1aN0M0 Squamous T2aN0M0 Prior therapy: 1. Surgical resection with wedge resection and mediastinal lymph node s   OV 02/15/2021  Subjective:  Patient ID: Joy Williams, female , DOB: [birth year] , age 66 y.o. , MRN: [id number] , ADDRESS: [address] [place] [phone] PCP Mayer Masker, PA-C Patient Care Team: Mayer Masker, PA-C as PCP - General [name], MD as PCP - Cardiology (Cardiology) [name], MD as Consulting Physician (Pulmonary Disease) [name], MD as Consulting Physician (Rheumatology) [name], MD as Referring Physician [name], MD as Referring Physician (Internal Medicine) [name], MD as Consulting Physician (Infectious Diseases) [name], Rounding, MD as Rounding Team (Cardiology)  This Provider for this visit: Treatment Team:  Attending Provider: Kalman Shan, MD    02/15/2021 -   Chief Complaint  Patient presents with   Follow-up    PFT  performed today. Pt states she has been doing okay since last visit and states she is about the same.    HPI MASHAYLA DILONE 72 y.o. -presents for follow-up.  Last seen in May 2022.  At the time initiated ILD work-up.  Her ANA is strongly positive.  She has history of Raynaud's.  So we sent her to rheumatology.  She saw Dr Corliss Skains only recently.  First note reviewed.  No specific connective tissue disease identified. AVISE was sent out and they texted me today saying the results were negative.  Patient continues to overall feel the same.  She had pulmonary function test that shows decline over time.  Approximately 6 %/year over the last 18 months.  This is when  FVC and DLCO are averaged.  She is definitely not feeling the difference.  Of note she had a Takotsubo cardiomyopathy in June 2022.  By July 2022 her echo normalized.  She will consult cardiac rehabilitation soon.  She continues to smoke but has reduced.  She nondistended importance of quitting smoking.  She will have a high-dose flu shot today.  We discussed COVID booster by Valent.       IMPRESSION: 1. There is mild pulmonary fibrosis in a pattern with apical to basal gradient, featuring irregular peripheral interstitial opacity, septal thickening, traction bronchiectasis, and subpleural bronchiolectasis at the lung basis without clear evidence of honeycombing. Fibrotic findings are worsened over time on prior examinations dating back to 11/16/2014. Findings are categorized as probable UIP per consensus guidelines: Diagnosis of Idiopathic Pulmonary Fibrosis: An Official ATS/ERS/JRS/ALAT Clinical Practice Guideline. Am Rosezetta Schlatter Crit Care Med Vol 198, Iss 5, [phone], Jan 18 2017. 2. Redemonstrated postoperative findings of anterior left upper lobe and dependent left lower lobe wedge resections. No evidence of malignant recurrence in the chest. 3. Coronary artery disease.   Aortic Atherosclerosis (ICD10-I70.0).     Electronically Signed   By: Lauralyn Primes M.D.   OnResults for GRAISYN, LEMASTER (MRN [id number]) as of 10/20/2020 16:40  Ref. Range 10/12/2020 16:37  Anti Nuclear Antibody (ANA) Latest Ref Range: NEGATIVE  POSITIVE (A)  ANA Pattern 1 Unknown Nuclear, Nucleolar (A)  ANA Titer 1 Latest Units: titer 1:1,280 (H)  Cyclic Citrullin Peptide Ab Latest Units: UNITS <16  ds DNA Ab Latest Units: IU/mL <1  Myeloperoxidase Abs Latest Units: AI <1.0  Serine Protease 3 Latest Units: AI <1.0  RA Latex Turbid. Latest Ref Range: <14 IU/mL <14  SSA (Ro) (ENA) Antibody, IgG Latest Ref Range: <1.0 NEG AI <1.0 NEG  SSB (La) (ENA) Antibody, IgG Latest Ref Range: <1.0 NEG AI <1.0 NEG   Scleroderma (Scl-70) (ENA) Antibody, IgG Latest Ref Range: <1.0 NEG AI <1.0 NEG      OV 03/26/2021  Subjective:  Patient ID: Joy Williams, female , DOB: [birth year] , age 16 y.o. , MRN: [id number] , ADDRESS: [address] [place] [phone] PCP Mayer Masker, PA-C Patient Care Team: Mayer Masker, PA-C as PCP - General [name], MD as PCP - Cardiology (Cardiology) [name], MD as Consulting Physician (Pulmonary Disease) [name], MD as Consulting Physician (Rheumatology) [name], MD as Referring Physician [name], MD as Referring Physician (Internal Medicine) [name], MD as Consulting Physician (Infectious Diseases) [name], Rounding, MD as Rounding Team (Cardiology)  This Provider for this visit: Treatment Team:  Attending Provider: Kalman Shan, MD   03/26/2021 -  non-IPF ILD (IPAF) progressive phenotype.    HPI LEN QUINONES 72 y.o. -  strted ofev 03/22/21. No diarrhea. Occ mild headache. Noticing mild change in appetite which she is keeping track of it. Went to rehab today and wanted to do video visit instead of face to face.  We discussed participation in clinical research.  She is very interested.  She will start off with doing the ILD-Pro registry.  I will have the research team send her a consent form.  If there is any interventional trial in the future then we will contact her about it.  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  And this included  CT Chest data  No results found.  Results for NASYA, KOSS (MRN [id number]) as of 03/26/2021 14:11  Ref. Range 11/30/2020 11:10  AST Latest Ref Range: 0 - 40 IU/L 23  ALT Latest Ref Range: 0 - 32 IU/L 12       OV 10/16/2021  Subjective:  Patient ID: Joy Williams, female , DOB: 07/24/1950 , age 39 y.o. , MRN: [id number] , ADDRESS: [address] [place] [phone] PCP Mayer Masker,  PA-C Patient Care Team: Mayer Masker, PA-C as PCP - General [name], MD as PCP - Cardiology (Cardiology) [name], MD as Consulting Physician (Pulmonary Disease) [name], MD as Consulting Physician (Rheumatology) [name], MD as Referring Physician [name], MD as Referring Physician (Internal Medicine) [name], MD as Consulting Physician (Infectious Diseases) [name], Rounding, MD as Rounding Team (Cardiology)  This Provider for this visit: Treatment Team:  Attending Provider: Kalman Shan, MD     10/16/2021 -   Chief Complaint  Patient presents with   Follow-up    Patient has been having side effects from the OFEV medication which is the reason for today's visit. Pt states she has had to cut her OFEV down to taking 1 pill a day versus the recommended 2 pills daily.     HPI VIRIDIANA HOLLERN 72 y.o. -returns for follow-up.  Clinically feels stable although symptom scores are worse compared to 1 year ago.  The main reason is that she is having severe diarrhea on full dose nintedanib.  She reduced it to 150 mg once daily and the diarrhea improved.  Now for the last 4 days she is stopped taking nintedanib because she ran out.  She tried several measures to control the diarrhea including taking Imodium all the time but she does not want to do it.  At baseline she does eat spicy food.  She also takes 1 Stevia a day along with 1 cup of coffee but there is no excessive sweetener use or dessert use.  She does not want to change her diet because she feels at baseline she was fine.  Symptom wise she is stable but how she feels but over the course of 1 year his shortness of breath appears to be worse.  Her last CT scan of the chest was in June 2022 Last pulmonary function test was in September 2022.  She has upcoming vacation to the [employer]   She is interested in ILD-Pro registry study but she missed the window because she  did not reply to Designer, industrial/product phone calls.  However she remains interested.   PFT   OV 03/18/2022  Subjective:  Patient ID: Joy Williams, female , DOB: 1950/10/19 , age 4 y.o. , MRN: [id number] , ADDRESS: [address] [place] [phone] PCP Mayer Masker, PA-C Patient Care Team: Mayer Masker, PA-C as PCP -  General [name], MD as PCP - Cardiology (Cardiology) [name], MD as Consulting Physician (Pulmonary Disease) [name], MD as Consulting Physician (Rheumatology) [name], MD as Referring Physician [name], MD as Referring Physician (Internal Medicine) [name], MD as Consulting Physician (Infectious Diseases) [name], Rounding, MD as Rounding Team (Cardiology)  This Provider for this visit: Treatment Team:  Attending Provider: Kalman Shan, MD    03/18/2022 -   Chief Complaint  Patient presents with   Follow-up    PFT performed today.  Pt states she has been doing okay since last visit. States she has had some congestion and also problems with coughing in the mornings.    HPI NALIJAH NASTRI 72 y.o. -returns for follow-up.  She is a mother of Francee Piccolo who I got a note to the [employer].  Since her last visit she is doing well overall.  She is tolerating the low-dose of nintedanib well.  She had recent liver function tests monitoring but her sodium is low at 129 on February 06, 2022.  Her baseline is always between 130-132 on the lower side but this is even further lower.  She is not on any hydrochlorothiazide.  She is not on Lasix currently.  She continues to smoke.  Her symptoms are stable although she is reporting worsening cough there is no wheezing the Trelegy does help but despite this during the night and early morning she is having worsening cough.  She did have a CT scan of the chest that shows progression over time including from last year.  Her  most recent pulmonary function test is slightly improved from a year ago but FVC is slightly down compared to a year and a half 2 years ago.  According to radiology ILD is progressive even compared to a year ago.  Definitely more progressive over time.  Overall it appears the nintedanib might be helping her.   In terms of her smoking we discussed this.  She is interested in quitting.  She has never tried Chantix in the past.  We did discuss dreams and other side effects from it.  She denies any suicidal ideations.  We discussed how to quit smoking with the help of Chantix.  We at least went 3 minutes quitting smoking   Her lung cancer is in remission.   Based on CT chest September 2023    CT Chest data - HRCT Sept 2023  IMPRESSION: 1. Moderate pulmonary fibrosis in a pattern with apical to basal gradient, featuring irregular peripheral interstitial opacity, septal thickening, traction bronchiectasis, and subpleural bronchiolectasis at the lung bases, now with some evidence of honeycombing, slightly worsened compared to prior examination dated 10/18/2020 and more clearly worsened over a longer period of time dating back to 2016. Findings are consistent with UIP per consensus guidelines: Diagnosis of Idiopathic Pulmonary Fibrosis: An Official ATS/ERS/JRS/ALAT Clinical Practice Guideline. Am Rosezetta Schlatter Crit Care Med Vol 198, Iss 5, [phone], Jan 18 2017. 2. Status post anterior left upper lobe and posterior left lower lobe wedge resections. No evidence of malignant recurrence in the chest. 3. Coronary artery disease.   Aortic Atherosclerosis (ICD10-I70.0).     Electronically Signed   By: Jearld Lesch M.D.   On: 01/20/2022 21:21   OV 11/19/2022  Subjective:  Patient ID: Joy Williams, female , DOB: 1950-11-06 , age 89 y.o. , MRN: [id number] , ADDRESS: [address] [place] [phone] PCP Melida Quitter, PA Patient Care  Team: Melida Quitter, PA as PCP - General  (Family Medicine) [name], MD as PCP - Cardiology (Cardiology) [name], MD as Consulting Physician (Pulmonary Disease) [name], MD as Consulting Physician (Rheumatology) [name], MD as Referring Physician [name], MD as Referring Physician (Internal Medicine) [name], MD as Consulting Physician (Infectious Diseases) [name], Rounding, MD as Rounding Team (Cardiology)  This Provider for this visit: Treatment Team:  Attending Provider: Kalman Shan, MD    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 "   - start ofev 150mg  03/22/21 -> changed to low-dose in May 2023 due to GI side effects-> stopped May 2024 due to side effects including diarrhea and weight loss  Follow-up ongoing smoking  History of left upper lobe non-small cell lung cancer resection 2016  - T2aN0,  2.8 cm, adenocarcinoma ( + visceral invasion - Rx [facility] = Adjuvant chemotherapy - 4 cycles under the direction of Dr. Rexene Alberts  -in remission as of September 2023  Evidence of histoplasma in 2016 hyalinized granuloma during left upper lobe wedge resection  Small hiatal hernia on CT scan chest September 2022 monitor for acid reflux symptoms  Other significant cardiac history -follows Dr. Laurance Flatten  -June 2022 hospitalization for Takotsubo cardiomyopathy with ejection fraction 25-35%.   -History of intolerance to GDMT treatment.  On Plavix   - ECHO improved 60% ef - July  2023  -Mitral valve prolapse status post severe mitral regurgitation status post bioprosthetic mitral valve replacement 2018  -Carotid artery disease status post right CEA March 2022  History of iron deficiency anemia  Age-related osteoporosis  11/19/2022 -   Chief Complaint  Patient presents with   Follow-up    Breathing has been ok  PFT:11/19/22 ACT 20      HPI SARASWATI SCHEMPP 72 y.o. -returns for follow-up.  She completed her ILD-Pro registry research visit today.  She tells me that in May 2024 she did call us with significant side effects from her nintedanib even at the low-dose protocol of 100 mg twice daily.  She had weight loss and diarrhea.  So we advised her to stop it.  She had already stopped it by then.  Currently she is feeling good her weight is gone up.  And her quality of life is better.  However she tells me that coinciding with this either because of stopping the nintedanib or because of the pollen according to her subjective report she is coughing more than usual.  She is also having some discolored sputum.  However her shortness of breath is around the same.  However on objective level overall her symptoms appear to be the same even compared to before.  We discussed about getting allergy workup today but took a shared decision making against this.  Access hypoxemia test did not show any desaturations but a pulmonary function test is suggesting decline.  Other issues - She did have questions about the ILD-Pro registry.  She is continue to participate in this but she wondered about blood  test results from that.  I did indicate to her those were research labs and I do not have access to what is being tested on that.  -Chronic systolic heart failure: A year ago her echo normalized.  Most recently yesterday she saw Dr. Laurance Flatten her cardiologist and was asked to continue Plavix and monitor her daily weights  -Lab review: August 28, 2022: Creatinine normal and hemoglobin  normal   -Lung cancer recurrence surveillance: She will be due for his annual CT scan of the chest in September 2024 [also can be done for early progression]   -Smoking: She continues to smoke.   SYMPTOM SCALE - ILD 10/12/2020  10/16/2021 138# 03/18/2022  11/19/2022   O2 use ra ra ra a2  Shortness of Breath 0 -> 5 scale with 5 being worst (score 6 If unable to do)   0  At rest 0 1 0 2  Simple tasks - showers, clothes change, eating, shaving 0 1 0 0  Household (dishes, doing bed, laundry) 1 2 0 0  Shopping 2 2 0 1  Walking level at own pace 1 1 0 1  Walking up Stairs 2 3 2 3   Total (30-36) Dyspnea Score 6 10 2 7   How bad is your cough? 2.5 3 3.5 3.5  How bad is your fatigue 2.5 2 2 2   How bad is nausea 0 0 0 0  How bad is vomiting?  0 0 0 0  How bad is diarrhea? 0 0 afer stopping all ofev 2 0  How bad is anxiety? 1 1 0 00  How bad is depression 0 0 0 0       Simple office walk 185 feet x  3 laps goal with forehead probe 10/12/2020  11/19/2022   O2 used ra ra  Number laps completed 3 Sit and stand x 10  Comments about pace avg   Resting Pulse Ox/HR 100% and 72/min 99% and HR 66  Final Pulse Ox/HR 100% and 95/min 96% and HR 73  Desaturated </= 88% no   Desaturated <= 3% points no   Got Tachycardic >/= 90/min yes   Symptoms at end of test No complaints No complaitns  Miscellaneous comments x     PFT     Latest Ref Rng & Units 11/19/2022    1:11 PM 03/18/2022    1:47 PM 02/15/2021    2:49 PM 08/05/2019   10:55 AM 03/12/2016   10:26 AM 11/14/2014   11:05 AM  PFT Results  FVC-Pre L 2.33  P 2.48  2.35  2.52  2.67  2.97   FVC-Predicted Pre % 72  P 77  71  74  76  84   FVC-Post L   2.41  2.52  2.61  3.00   FVC-Predicted Post %   72  74  75  85   Pre FEV1/FVC % % 78  P 81  78  81  78  78   Post FEV1/FCV % %   81  82  80  78   FEV1-Pre L 1.82  P 2.00  1.84  2.04  2.09  2.33   FEV1-Predicted Pre % 75  P 82  73  79  78  86   FEV1-Post L   1.96  2.06  2.09  2.33   DLCO  uncorrected ml/min/mmHg 11.03  P 13.70  12.17  13.77  13.52  14.61   DLCO UNC% % 53  P 66  57  64  48  52   DLCO corrected ml/min/mmHg 11.03  P 13.70  12.17  13.77  14.73    DLCO COR %Predicted % 53  P 66  57  64  53    DLVA Predicted % 82  P 83  86  87  84  65   TLC L   3.83  4.22  4.18  4.88   TLC % Predicted %   70  77  77  89   RV % Predicted %   64  74  71  88     P Preliminary result       has a past medical history of Allergy, Asthma, Carotid artery disease (HCC) (2019), Chronic diastolic congestive heart failure (HCC), 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   reports that she has been smoking cigarettes. She started smoking about 39 years ago. She has a 24.00 pack-year smoking history. She has never been exposed to tobacco smoke. She has never used smokeless tobacco.  Past Surgical History:  Procedure Laterality Date   BACK SURGERY     x 3  Disectomy   BREAST BIOPSY Left    CARDIAC CATHETERIZATION N/A 03/08/2016   Procedure: Right/Left Heart Cath and Coronary Angiography;  Surgeon: Tonny Bollman, MD;  Location: [facility] INVASIVE CV LAB;  Service: Cardiovascular;  Laterality: N/A;   CLAVICLE SURGERY Left 2013   plate to left collar bone   ENDARTERECTOMY Right 08/09/2020   Procedure: RIGHT CAROTID ENDARTERECTOMY;  Surgeon: Maeola Harman, MD;  Location: [facility] OR;  Service: Vascular;  Laterality: Right;   LAPAROSCOPY     ?  reason-age 73    LEFT HEART CATH AND CORONARY ANGIOGRAPHY N/A 02/25/2019   Procedure: LEFT HEART CATH AND CORONARY ANGIOGRAPHY;  Surgeon: Corky Crafts, MD;  Location: [facility] INVASIVE CV LAB;  Service: Cardiovascular;  Laterality: N/A;   LEFT HEART CATH AND CORONARY ANGIOGRAPHY N/A 10/24/2020   Procedure: LEFT HEART CATH AND CORONARY ANGIOGRAPHY;  Surgeon: Corky Crafts, MD;  Location: [facility] INVASIVE CV LAB;  Service: Cardiovascular;  Laterality: N/A;   LUNG CANCER SURGERY     MITRAL VALVE REPAIR Right 06/27/2016   Procedure: MINIMALLY INVASIVE MITRAL VALVE REPLACEMENT;  Surgeon: Purcell Nails, MD;  Location: [facility] OR;  Service: Open Heart Surgery;  Laterality: Right;   TEE WITHOUT CARDIOVERSION N/A 02/22/2016   Procedure: TRANSESOPHAGEAL ECHOCARDIOGRAM (TEE);  Surgeon: Lars Masson, MD;  Location: [facility] ENDOSCOPY;  Service: Cardiovascular;  Laterality: N/A;   TEE WITHOUT CARDIOVERSION N/A 06/27/2016   Procedure: TRANSESOPHAGEAL ECHOCARDIOGRAM (TEE);  Surgeon: Purcell Nails, MD;  Location: [facility] OR;  Service: Open Heart Surgery;  Laterality: N/A;   TONSILLECTOMY     VIDEO ASSISTED THORACOSCOPY (VATS)/WEDGE RESECTION Left 12/30/2014   Bronchoscopy, Mediastinoscopy, Left VATS for Wedge resection LUL x2 adn LLL x1 - Dr. Ewing Schlein at [facility]   VOCAL CORD INJECTION Left 2017   injected with botox   wrist surgery Left 2015   plate to wrist     Allergies  Allergen Reactions   Penicillins Other (See Comments) and Hives    Unknown, occurred as  a child  Unknown, occurred as a child, Has patient had a PCN reaction causing immediate rash, facial/tongue/throat swelling, SOB or lightheadedness with hypotension: Yes, Has patient had a PCN reaction causing severe rash involving mucus membranes or skin necrosis: No, Has patient had a PCN reaction that required hospitalization , Has patient had a PCN reaction occurring within the last 10 years: No, If all of the above answers are "NO", then may proceed with  Cephalosporin use., No   Bee Venom Swelling    Severe swelling at the sting site.    Immunization History  Administered Date(s) Administered   Fluad Quad(high Dose 65+) 02/18/2019, 02/15/2021   H1N1 04/13/2008   Influenza Split 02/18/2015   Influenza, High Dose Seasonal PF 02/20/2017, 04/27/2018, 03/04/2022   Influenza,inj,Quad PF,6+ Mos 03/16/2015   Influenza-Unspecified 04/12/2008, 02/18/2011   Moderna Sars-Covid-2 Vaccination 06/21/2019, 07/18/2019, 01/25/2020   PFIZER(Purple Top)SARS-COV-2 Vaccination 03/04/2022   Pneumococcal Conjugate-13 01/22/2017   Tdap 09/26/2015   Zoster Recombinant(Shingrix) 05/10/2018, 03/03/2019    Family History  Problem Relation Age of Onset   Mitral valve prolapse Mother    Dementia Father    Prostate cancer Father    Mitral valve prolapse Sister    Mitral valve prolapse Brother    Healthy Son    Healthy Daughter    Colon cancer Neg Hx    Colon polyps Neg Hx    Rectal cancer Neg Hx    Esophageal cancer Neg Hx    Stomach cancer Neg Hx    Heart attack Neg Hx      Current Outpatient Medications:    acetaminophen (TYLENOL) 500 MG tablet, Take 1,000 mg by mouth every 6 (six) hours as needed for mild pain or headache., Disp: , Rfl:    Cholecalciferol (VITAMIN D) 125 MCG (5000 UT) CAPS, Take 5,000 Units by mouth daily., Disp: , Rfl:    clopidogrel (PLAVIX) 75 MG tablet, Take 1 tablet (75 mg total) by mouth daily., Disp: 90 tablet, Rfl: 2   EPINEPHrine (EPIPEN 2-PAK) 0.3 mg/0.3 mL IJ SOAJ injection, Inject 0.3 mg into the muscle as needed for anaphylaxis., Disp: 1 each, Rfl: 1   ferrous sulfate 325 (65 FE) MG tablet, Take 1 tablet (325 mg total) by mouth daily., Disp: 90 tablet, Rfl: 3   hydrALAZINE (APRESOLINE) 25 MG tablet, Take 1 tablet (25 mg total) by mouth daily as needed (for systolic BP>150)., Disp: 30 tablet, Rfl: 9   levothyroxine (SYNTHROID) 175 MCG tablet, Take 1 tablet po QAM, Disp: 90 tablet, Rfl: 1   MELATONIN PO, Take 1 tablet by  mouth at bedtime as needed (sleep)., Disp: , Rfl:    Multiple Vitamins-Minerals (MULTIVITAMIN WITH MINERALS) tablet, Take 1 tablet by mouth daily., Disp: , Rfl:    rosuvastatin (CRESTOR) 20 MG tablet, Take 1 tablet (20 mg total) by mouth every evening., Disp: 90 tablet, Rfl: 3   TRELEGY ELLIPTA 100-62.5-25 MCG/ACT AEPB, INHALE 1 PUFF BY MOUTH EVERY DAY, Disp: 60 each, Rfl: 5   Turmeric 500 MG CAPS, Take 500 mg by mouth daily., Disp: , Rfl:    vitamin B-12 (CYANOCOBALAMIN) 1000 MCG tablet, Take 1 tablet (1,000 mcg total) by mouth daily., Disp: , Rfl:    azithromycin (ZITHROMAX) 250 MG tablet, z-pack - take as directed for 5 days (Patient not taking: Reported on 11/19/2022), Disp: 6 tablet, Rfl: 0      Objective:   Vitals:   11/19/22 1435  BP: 112/74  Pulse: 71  SpO2: 96%  Weight: 132 lb  3.2 oz (60 kg)  Height: 5' 6.5" (1.689 m)    Estimated body mass index is 21.02 kg/m as calculated from the following:   Height as of this encounter: 5' 6.5" (1.689 m).   Weight as of this encounter: 132 lb 3.2 oz (60 kg).  @WEIGHTCHANGE @  [employer]   11/19/22 1435  Weight: 132 lb 3.2 oz (60 kg)     Physical Exam   General: No distress.  She has significant telangiectasias O2 at rest: no Cane present: no Sitting in wheel chair: no Frail: no Obese: no Neuro: Alert and Oriented x 3. GCS 15. Speech normal Psych: Pleasant Resp:  Barrel Chest - no.  Wheeze - no, Crackles - yes, No overt respiratory distress CVS: Normal heart sounds. Murmurs - no Ext: Stigmata of Connective Tissue Disease - no HEENT: Normal upper airway. PEERL +. No post nasal drip        Assessment:       ICD-10-CM   1. ILD (interstitial lung disease) (HCC)  J84.9 Pulmonary function test    CT Chest High Resolution    2. History of Raynaud's syndrome  Z86.79     3. ANA positive  R76.8     4. Diarrhea due to drug  K52.1     5. History of lung cancer  Z85.118     6. Chronic cough  R05.3     7. DOE  (dyspnea on exertion)  R06.09 Pulmonary function test    8. Current Cigarette smoker  F17.210 Pulmonary function test         Plan:     Patient Instructions     ICD-10-CM   1. ILD (interstitial lung disease) (HCC)  J84.9     2. History of Raynaud's syndrome  Z86.79     3. ANA positive  R76.8     4. Diarrhea due to drug  K52.1     5. History of lung cancer  Z85.118     6. Chronic cough  R05.3     7. DOE (dyspnea on exertion)  R06.09       Concern based on breathing test and also worsening of cough that the pulmonary fibrosis could be worse although the breathing test itself could be part of her normal up/down variation and the cough itself could be because of seasonal allergies.  It is reassuring that your shortness of breath levels and the excess hypoxemia test and the vital capacity and a breathing test are stable  Too bad that Ofev caused significant side effects including diarrhea and this had to be stopped early May 2024  Appreciate continued participation in ILD-Pro registry  Ongoing smoking is contributing to cough and at risk for recurrence of lung cancer  Plan -CMA to list Ofev as allergy  - Close follow-up required -Do spirometry and DLCO repeat in 2-1/2 to 3 months -Do high-resolution CT chest in 2-1/2 to 3 months -Continue participation ILD-Pro registry -Holding off on short course prednisone or blood allergy testing given your cough  - shared decision making  Followup - 10-12 week seeks do spirometry and DLCO and CT scan of the chest   - symptom score and sit/stand test at follow-up  -30-minute visit  - if progression + -> consider immune modulartos +/- clinical trial  Return in about 11 weeks (around 02/04/2023) for 30 min visit, after HRCT chest, after Cleda Daub and DLCO, with Dr Marchelle Gearing, Face to Face Visit.   ( Level 05 visit E&M 2024: Estb >=  40 min   in  visit type: on-site physical face to visit  in total care time and counseling or/and  coordination of care by this undersigned MD - Dr Kalman Shan. This includes one or more of the following on this same day 11/19/2022: pre-charting, chart review, note writing, documentation discussion of test results, diagnostic or treatment recommendations, prognosis, risks and benefits of management options, instructions, education, compliance or risk-factor reduction. It excludes time spent by the CMA or office staff in the care of the patient. Actual time 40 min)   SIGNATURE    Dr. Kalman Shan, M.D., F.C.C.P,  Pulmonary and Critical Care Medicine Staff Physician, [facility] Center Director - Interstitial Lung Disease  Program  Pulmonary Fibrosis [facility] at [facility] [place], [place], [address]  Pager: [phone], If no answer or between  15:00h - 7:00h: call [phone] Telephone: [phone]  5:31 PM 11/19/2022   Moderate Complexity MDM OFFICE  2021 E/M guidelines, first released in 2021, with minor revisions added in 2023 and 2024 Must meet the requirements for 2 out of 3 dimensions to qualify.    Number and complexity of problems addressed Amount and/or complexity of data reviewed Risk of complications and/or morbidity  One or more chronic illness with mild exacerbation, OR progression, OR  side effects of treatment  Two or more stable chronic illnesses  One undiagnosed new problem with uncertain prognosis  One acute illness with systemic symptoms   One Acute complicated injury Must meet the requirements for 1 of 3 of the categories)  Category 1: Tests and documents, historian  Any combination of 3 of the following:  Assessment requiring an independent historian  Review of prior external note(s) from each unique source  Review of results of each unique test  Ordering of each unique test    Category 2: Interpretation of tests   Independent interpretation of a test performed by another physician/other qualified  health care professional (not separately reported)  Category 3: Discuss management/tests  Discussion of management or test interpretation with external physician/other qualified health care professional/appropriate source (not separately reported) Moderate risk of morbidity from additional diagnostic testing or treatment Examples only:  Prescription drug management  Decision regarding minor surgery with identfied patient or procedure risk factors  Decision regarding elective major surgery without identified patient or procedure risk factors  Diagnosis or treatment significantly limited by social determinants of health             HIGh Complexity  OFFICE   2021 E/M guidelines, first released in 2021, with minor revisions added in 2023. Must meet the requirements for 2 out of 3 dimensions to qualify.    Number and complexity of problems addressed Amount and/or complexity of data reviewed Risk of complications and/or morbidity  Severe exacerbation of chronic illness  Acute or chronic illnesses that may pose a threat to life or bodily function, e.g., multiple trauma, acute MI, pulmonary embolus, severe respiratory distress, progressive rheumatoid arthritis, psychiatric illness with potential threat to self or others, peritonitis, acute renal failure, abrupt change in neurological status Must meet the requirements for 2 of 3 of the categories)  Category 1: Tests and documents, historian  Any combination of 3 of the following:  Assessment requiring an independent historian  Review of prior external note(s) from each unique source  Review of results of each unique test  Ordering of each unique test    Category 2: Interpretation of  tests    Independent interpretation of a test performed by another physician/other qualified health care professional (not separately reported)  Category 3: Discuss management/tests  Discussion of management or test interpretation with  external physician/other qualified health care professional/appropriate source (not separately reported)  HIGH risk of morbidity from additional diagnostic testing or treatment Examples only:  Drug therapy requiring intensive monitoring for toxicity  Decision for elective major surgery with identified pateint or procedure risk factors  Decision regarding hospitalization or escalation of level of care  Decision for DNR or to de-escalate care   Parenteral controlled  substances            LEGEND - Independent interpretation involves the interpretation of a test for which there is a CPT code, and an interpretation or report is customary. When a review and interpretation of a test is performed and documented by the provider, but not separately reported (billed), then this would represent an independent interpretation. This report does not need to conform to the usual standards of a complete report of the test. This does not include interpretation of tests that do not have formal reports such as a complete blood count with differential and blood cultures. Examples would include reviewing a chest radiograph and documenting in the medical record an interpretation, but not separately reporting (billing) the interpretation of the chest radiograph.   An appropriate source includes professionals who are not health care professionals but may be involved in the management of the patient, such as a Clinical research associate, upper officer, case manager or teacher, and does not include discussion with family or informal caregivers.    - SDOH: SDOH are the conditions in the environments where people are born, live, learn, work, play, worship, and age that affect a wide range of health, functioning, and quality-of-life outcomes and risks. (e.g., housing, food insecurity, transportation, etc.). SDOH-related Z codes ranging from Z55-Z65 are the ICD-10-CM diagnosis codes used to document SDOH data Z55 - Problems  related to education and literacy Z56 - Problems related to employment and unemployment Z57 - Occupational exposure to risk factors Z58 - Problems related to physical environment Z59 - Problems related to housing and economic circumstances [phone] - Problems related to social environment [phone] - Problems related to upbringing [phone] - Other problems related to primary support group, including family circumstances Z60 - Problems related to certain psychosocial circumstances Z65 - Problems related to other psychosocial circumstances

## 2022-11-19 NOTE — Progress Notes (Signed)
Spiro/DLCO performed today.  

## 2022-11-19 NOTE — Patient Instructions (Addendum)
ICD-10-CM   1. ILD (interstitial lung disease) (HCC)  J84.9     2. History of Raynaud's syndrome  Z86.79     3. ANA positive  R76.8     4. Diarrhea due to drug  K52.1     5. History of lung cancer  Z85.118     6. Chronic cough  R05.3     7. DOE (dyspnea on exertion)  R06.09       Concern based on breathing test and also worsening of cough that the pulmonary fibrosis could be worse although the breathing test itself could be part of her normal up/down variation and the cough itself could be because of seasonal allergies.  It is reassuring that your shortness of breath levels and the excess hypoxemia test and the vital capacity and a breathing test are stable  Too bad that Ofev caused significant side effects including diarrhea and this had to be stopped early May 2024  Appreciate continued participation in ILD-Pro registry  Ongoing smoking is contributing to cough and at risk for recurrence of lung cancer  Plan -CMA to list Ofev as allergy  - Close follow-up required -Do spirometry and DLCO repeat in 2-1/2 to 3 months -Do high-resolution CT chest in 2-1/2 to 3 months -Continue participation ILD-Pro registry -Holding off on short course prednisone or blood allergy testing given your cough  - shared decision making  Followup - 10-12 week seeks do spirometry and DLCO and CT scan of the chest   - symptom score and sit/stand test at follow-up  -30-minute visit  - if progression + -> consider immune modulartos +/- clinical trial  Return in about 11 weeks (around 02/04/2023) for 30 min visit, after HRCT chest, after Cleda Daub and DLCO, with Dr Marchelle Gearing, Face to Face Visit.

## 2022-11-19 NOTE — Research (Signed)
Title: Chronic Fibrosing Interstitial Lung Disease with Progressive Phenotype Prospective Outcomes (ILD-PRO) Registry    Protocol #: IPF-PRO-SUB, Clinical Trials # [id number], Sponsor: [employer]   Protocol Version: Protocol Amendment 4 (Version Date: 29 January 2018)   IB: N/A   ICF:  [employer] Approved Version 23 Apr 2018 Revised 17Oct2023 (**version for ILD-PRO subjects, not for IPF-Pro subjects**)   IPF-Pro: Addendum Consent To Participate In A Research Registry; [employer] Approved Version 10 Mar 2018 Revised 10 Mar 2018 (*only for IPF-PRO subjects needing to have DNA redrawn*) Lab Manual: v2.0.0 dated 06Dec2021  Objectives:  Describe current approaches to diagnosis and treatment of chronic fibrosing ILDs with progressive phenotype  Describe the natural history of chronic fibrosing ILDs with progressive phenotype  Assess quality of life from self-administered participant reported questionnaires for each disease group  Describe participant interactions with the healthcare system, describe treatment practices across multiple institutions for each disease group  Collect biological samples linked to well characterized chronic fibrosing ILDs with progressive phenotype to identify disease biomarkers  Collect data and biological samples that will support future research studies.                                            Key Inclusion Criteria: Willing and able to provide informed consent  Age ? 30 years  Diagnosis of a non-IPF ILD of any duration, including, but not limited to Idiopathic Non-Specific Interstitial Pneumonia (INSIP), Unclassifiable Idiopathic Interstitial Pneumonias (IIPs), Interstitial Pneumonia with Autoimmune Features (IPAF), Autoimmune ILDs such as Rheumatoid Arthritis (RA-ILD) and Systemic Sclerosis (SSC-ILD), Chronic Hypersensitivity Pneumonitis (HP), Sarcoidosis or Exposure-related ILDs such as asbestosis.  Chronic fibrosing ILD defined by  reticular abnormality with traction bronchiectasis with or without honeycombing confirmed by chest HRCT scan and/or lung biopsy.  Progressive phenotype as defined by fulfilling at least one of the criteria below of fibrotic changes (progression set point) within the last 24 months regardless of treatment considered appropriate in individual ILDs:  decline in FVC % predicted (% pred) based on >10% relative decline  decline in FVC % pred based on ? 5 - <10% relative decline in FVC combined with worsening of respiratory symptoms as assessed by the site investigator  decline in FVC % pred based on ? 5 - <10% relative decline in FVC combined with increasing extent of fibrotic changes on chest imaging (HRCT scan) as assessed by the site investigator  decline in DLCO % pred based on ? 10% relative decline  worsening of respiratory symptoms as well as increasing extent of fibrotic changes on chest imaging (HRCT scan) as assessed by the site investigator independent of FVC change.     Key Exclusion Criteria: Malignancy, treated or untreated, other than skin or early stage prostate cancer, within the past 5 years  Currently listed for lung transplantation at the time of enrollment  Currently enrolled in a clinical trial at the time of enrollment in this registry       Clinical Research Coordinator / Research RN note : This visit for Joy Williams  Subject [id number] with DOB:19-Jan-1951  on 19 November 2022 for the above protocol is Visit/Encounter 1 and is for purpose of research.    Subject expressed continued interest and consent in continuing as a study subject. Subject confirmed that there was no change in contact information (e.g. address, telephone, email). Subject thanked for participation in  research and contribution to science.     During this visit on 19 November 2022 , the subject completed the blood work and questionnaires per the above referenced protocol. Please refer to the subject's paper source binder  for further details.   Signed by Neita Garnet Clinical Research Coordinator [facility]  [place], [place] 19 November 2022,2:50 PM

## 2022-11-19 NOTE — Progress Notes (Signed)
Title: Chronic Fibrosing Interstitial Lung Disease with Progressive Phenotype Prospective Outcomes (ILD-PRO) Registry    Protocol #: IPF-PRO-SUB, Clinical Trials # [id number], Sponsor: [employer]   Protocol Version: Protocol Amendment 4 (Version Date: 29 January 2018)   IB: N/A   ICF:  [employer] [facility] Approved Version 23 Apr 2018 Revised 17Oct2023 (**version for ILD-PRO subjects, not for IPF-Pro subjects**)   IPF-Pro: Addendum Consent To Participate In A Research Registry; [employer] Approved Version 10 Mar 2018 Revised 10 Mar 2018 (*only for IPF-PRO subjects needing to have DNA redrawn*) Lab Manual: v2.0.0 dated 06Dec2021  Objectives:  Describe current approaches to diagnosis and treatment of chronic fibrosing ILDs with progressive phenotype  Describe the natural history of chronic fibrosing ILDs with progressive phenotype  Assess quality of life from self-administered participant reported questionnaires for each disease group  Describe participant interactions with the healthcare system, describe treatment practices across multiple institutions for each disease group  Collect biological samples linked to well characterized chronic fibrosing ILDs with progressive phenotype to identify disease biomarkers  Collect data and biological samples that will support future research studies.                                            Key Inclusion Criteria: Willing and able to provide informed consent  Age ? 30 years  Diagnosis of a non-IPF ILD of any duration, including, but not limited to Idiopathic Non-Specific Interstitial Pneumonia (INSIP), Unclassifiable Idiopathic Interstitial Pneumonias (IIPs), Interstitial Pneumonia with Autoimmune Features (IPAF), Autoimmune ILDs such as Rheumatoid Arthritis (RA-ILD) and Systemic Sclerosis (SSC-ILD), Chronic Hypersensitivity Pneumonitis (HP), Sarcoidosis or Exposure-related ILDs such as asbestosis.  Chronic fibrosing ILD defined by  reticular abnormality with traction bronchiectasis with or without honeycombing confirmed by chest HRCT scan and/or lung biopsy.  Progressive phenotype as defined by fulfilling at least one of the criteria below of fibrotic changes (progression set point) within the last 24 months regardless of treatment considered appropriate in individual ILDs:  decline in FVC % predicted (% pred) based on >10% relative decline  decline in FVC % pred based on ? 5 - <10% relative decline in FVC combined with worsening of respiratory symptoms as assessed by the site investigator  decline in FVC % pred based on ? 5 - <10% relative decline in FVC combined with increasing extent of fibrotic changes on chest imaging (HRCT scan) as assessed by the site investigator  decline in DLCO % pred based on ? 10% relative decline  worsening of respiratory symptoms as well as increasing extent of fibrotic changes on chest imaging (HRCT scan) as assessed by the site investigator independent of FVC change.     Key Exclusion Criteria: Malignancy, treated or untreated, other than skin or early stage prostate cancer, within the past 5 years  Currently listed for lung transplantation at the time of enrollment  Currently enrolled in a clinical trial at the time of enrollment in this registry       Clinical Research Coordinator / Research RN note : This visit for Joy Williams  Subject [id number] with DOB:08/01/1950  on 19 November 2022 for the above protocol is Visit/Encounter 1 and is for purpose of research.    Subject expressed continued interest and consent in continuing as a study subject. Subject confirmed that there was no change in contact information (e.g. address, telephone, email). Subject thanked for participation in   research and contribution to science.     During this visit on 19 November 2022 , the subject completed the blood work and questionnaires per the above referenced protocol. Please refer to the subject's paper source binder  for further details.   Signed by   Clinical Research Coordinator [facility]  [place], [place] 19 November 2022,2:50 PM      

## 2022-11-24 ENCOUNTER — Other Ambulatory Visit: Payer: Self-pay | Admitting: Nurse Practitioner

## 2022-11-24 DIAGNOSIS — E039 Hypothyroidism, unspecified: Secondary | ICD-10-CM

## 2022-12-11 ENCOUNTER — Encounter: Payer: Self-pay | Admitting: Dermatology

## 2022-12-11 ENCOUNTER — Ambulatory Visit (INDEPENDENT_AMBULATORY_CARE_PROVIDER_SITE_OTHER): Payer: Medicare Other | Admitting: Dermatology

## 2022-12-11 VITALS — BP 96/51 | HR 56

## 2022-12-11 DIAGNOSIS — L820 Inflamed seborrheic keratosis: Secondary | ICD-10-CM | POA: Diagnosis not present

## 2022-12-11 DIAGNOSIS — D229 Melanocytic nevi, unspecified: Secondary | ICD-10-CM

## 2022-12-11 NOTE — Patient Instructions (Addendum)
Hello Joy Williams,  Thank you for visiting my office today. I appreciate your commitment to addressing your skin concerns and am glad we could discuss the best treatment options for your comfort and health.  Here are the key instructions and recommendations from today's visit:  - Treatment for Seborrheic Keratosis: We proceeded with the liquid nitrogen freezing treatment to remove the benign growth on your back.   - Post-Treatment Care: Expect some cold sensation and mild burning for the next 24 hours. Keep the area covered with a band-aid.   - Daily Care: Shower daily, and before each shower, remove the band-aid, apply Vaseline, and then cover with a new band-aid after drying.   - Activity: You can continue with water aerobics as planned. Use a larger band-aid for better protection during your activity.  - Follow-Up: If the keratosis does not fall off within three weeks, please return to the clinic for a possible reapplication of the treatment.  - Sun Protection: Given the changes observed in your mole and your tan, it is crucial to use sunscreen. We have provided you with some sunscreen samples to use.  Should you have any questions or if there is anything further I can assist you with, please do not hesitate to contact our office.    Cryotherapy Aftercare  Wash gently with soap and water everyday.   Apply Vaseline and Band-Aid daily until healed.   Due to recent changes in healthcare laws, you may see results of your pathology and/or laboratory studies on MyChart before the doctors have had a chance to review them. We understand that in some cases there may be results that are confusing or concerning to you. Please understand that not all results are received at the same time and often the doctors may need to interpret multiple results in order to provide you with the best plan of care or course of treatment. Therefore, we ask that you please give [place] 2 business days to thoroughly review all  your results before contacting the office for clarification. Should we see a critical lab result, you will be contacted sooner.   If You Need Anything After Your Visit  If you have any questions or concerns for your doctor, please call our main line at [phone] If no one answers, please leave a voicemail as directed and we will return your call as soon as possible. Messages left after 4 pm will be answered the following business day.   You may also send [place] a message via MyChart. We typically respond to MyChart messages within 1-2 business days.  For prescription refills, please ask your pharmacy to contact our office. Our fax number is [phone].  If you have an urgent issue when the clinic is closed that cannot wait until the next business day, you can page your doctor at the number below.    Please note that while we do our best to be available for urgent issues outside of office hours, we are not available 24/7.   If you have an urgent issue and are unable to reach [place], you may choose to seek medical care at your doctor's office, retail clinic, urgent care center, or emergency room.  If you have a medical emergency, please immediately call 911 or go to the emergency department. In the event of inclement weather, please call our main line at [phone] for an update on the status of any delays or closures.  Dermatology Medication Tips: Please keep the boxes that topical medications come in  in order to help keep track of the instructions about where and how to use these. Pharmacies typically print the medication instructions only on the boxes and not directly on the medication tubes.   If your medication is too expensive, please contact our office at [phone] or send [place] a message through MyChart.   We are unable to tell what your co-pay for medications will be in advance as this is different depending on your insurance coverage. However, we may be able to find a substitute  medication at lower cost or fill out paperwork to get insurance to cover a needed medication.   If a prior authorization is required to get your medication covered by your insurance company, please allow [place] 1-2 business days to complete this process.  Drug prices often vary depending on where the prescription is filled and some pharmacies may offer cheaper prices.  The website www.goodrx.com contains coupons for medications through different pharmacies. The prices here do not account for what the cost may be with help from insurance (it may be cheaper with your insurance), but the website can give you the price if you did not use any insurance.  - You can print the associated coupon and take it with your prescription to the pharmacy.  - You may also stop by our office during regular business hours and pick up a GoodRx coupon card.  - If you need your prescription sent electronically to a different pharmacy, notify our office through [facility] or by phone at [phone]

## 2022-12-11 NOTE — Progress Notes (Signed)
   New Patient Visit   Subjective  Joy Williams is a 72 y.o. female who presents for the following: skin tag on the back   Patient states she has skin tag located on the back that she would like to have examined. Patient reports the areas have been there for at least a month. She reports the areas is bothersome. She states that the spot is "in the way" because it is on bra line and gets irritated. Patient reports spot has not previously been treated however she had a small part cut off by step daughter and she picked a small piece off because it scabbed.Patient has no Hx of skin cancer. Patient has a  family history of skin cancer(s).   Pt does not use sunscreen.  The following portions of the chart were reviewed this encounter and updated as appropriate: medications, allergies, medical history  Review of Systems:  No other skin or systemic complaints except as noted in HPI or Assessment and Plan.  Objective  Well appearing patient in no apparent distress; mood and affect are within normal limits.   A focused examination was performed of the following areas: back  Relevant exam findings are noted in the Assessment and Plan.  Exam: Uniform brown macule scattered on forearms   Right Lower Back Irritated,Tender textured brown waxy stuck on papule           Assessment & Plan   1. Seborrheic Keratosis on the Back - Assessment: Irritated seborrheic keratosis located on the bra line. - Plan: Cryotherapy with liquid nitrogen performed. Instruct patient to keep the area covered with a band-aid, shower daily, remove the band-aid before showering, apply Vaseline, and cover with a new band-aid. Advised to continue water aerobics with the area covered. Follow up in three weeks if the keratosis does not fall off for possible refreezing.  2. Melanocytic Nevi - Assessment: A regular mole noted on the patient's skin. - Plan: Advised patient to monitor the mole for any changes and to  wear sunscreen to protect the skin. Provided sunscreen samples.  Follow-up as needed: - Return in three weeks if the seborrheic keratosis does not fall off after cryotherapy treatment.   Procedure Note  Inflamed seborrheic keratosis Right Lower Back  Destruction of lesion - Right Lower Back Complexity: simple   Destruction method: cryotherapy   Informed consent: discussed and consent obtained   Timeout:  patient name, date of birth, surgical site, and procedure verified Lesion destroyed using liquid nitrogen: Yes   Region frozen until ice ball extended beyond lesion: Yes   Outcome: patient tolerated procedure well with no complications   Post-procedure details: wound care instructions given     Samples of sunscreen given   Return if symptoms worsen or fail to improve.  Owens Shark, CMA, am acting as scribe for [employer], DO.   Documentation: I have reviewed the above documentation for accuracy and completeness, and I agree with the above.  Langston Reusing, DO

## 2022-12-12 ENCOUNTER — Ambulatory Visit (HOSPITAL_COMMUNITY)
Admission: RE | Admit: 2022-12-12 | Discharge: 2022-12-12 | Disposition: A | Discharge status: Home / Self Care | Payer: Medicare Other | Source: Ambulatory Visit | Attending: Cardiology | Admitting: Cardiology

## 2022-12-12 DIAGNOSIS — I6523 Occlusion and stenosis of bilateral carotid arteries: Secondary | ICD-10-CM | POA: Insufficient documentation

## 2022-12-12 DIAGNOSIS — Z953 Presence of xenogenic heart valve: Secondary | ICD-10-CM | POA: Diagnosis not present

## 2022-12-12 DIAGNOSIS — E785 Hyperlipidemia, unspecified: Secondary | ICD-10-CM | POA: Insufficient documentation

## 2022-12-13 ENCOUNTER — Telehealth: Payer: Self-pay | Admitting: *Deleted

## 2022-12-13 ENCOUNTER — Ambulatory Visit
Admission: RE | Admit: 2022-12-13 | Discharge: 2022-12-13 | Disposition: A | Discharge status: Home / Self Care | Payer: Medicare Other | Source: Ambulatory Visit | Attending: Internal Medicine | Admitting: Internal Medicine

## 2022-12-13 DIAGNOSIS — R0609 Other forms of dyspnea: Secondary | ICD-10-CM | POA: Diagnosis not present

## 2022-12-13 DIAGNOSIS — J849 Interstitial pulmonary disease, unspecified: Secondary | ICD-10-CM

## 2022-12-13 DIAGNOSIS — J8410 Pulmonary fibrosis, unspecified: Secondary | ICD-10-CM | POA: Diagnosis not present

## 2022-12-13 DIAGNOSIS — R918 Other nonspecific abnormal finding of lung field: Secondary | ICD-10-CM | POA: Diagnosis not present

## 2022-12-13 DIAGNOSIS — I700 Atherosclerosis of aorta: Secondary | ICD-10-CM | POA: Diagnosis not present

## 2022-12-13 DIAGNOSIS — I6523 Occlusion and stenosis of bilateral carotid arteries: Secondary | ICD-10-CM

## 2022-12-13 NOTE — Telephone Encounter (Signed)
The patient has been notified of the result and verbalized understanding.  All questions (if any) were answered.  Pt aware she will need a repeat carotid US done in one year for surveillance.   She is aware that I will go ahead and place the order and have the PV Scheduler reach out to her closer to that time frame, to arrange that appt.   Pt verbalized understanding and agrees with this plan.

## 2022-12-13 NOTE — Telephone Encounter (Signed)
-----   Message from [name] sent at 12/13/2022 12:41 PM EDT ----- Carotids show mild disease bilaterally. Will continue current meds and annual carotids for monitoring.

## 2022-12-16 ENCOUNTER — Ambulatory Visit (HOSPITAL_COMMUNITY): Payer: Medicare Other

## 2022-12-16 ENCOUNTER — Encounter: Payer: Self-pay | Admitting: Internal Medicine

## 2022-12-16 DIAGNOSIS — C3482 Malignant neoplasm of overlapping sites of left bronchus and lung: Secondary | ICD-10-CM

## 2022-12-16 DIAGNOSIS — R918 Other nonspecific abnormal finding of lung field: Secondary | ICD-10-CM

## 2022-12-20 ENCOUNTER — Ambulatory Visit
Admission: RE | Admit: 2022-12-20 | Discharge: 2022-12-20 | Disposition: A | Discharge status: Home / Self Care | Payer: Medicare Other | Source: Ambulatory Visit | Attending: Family Medicine | Admitting: Family Medicine

## 2022-12-20 ENCOUNTER — Encounter: Payer: Self-pay | Admitting: Family Medicine

## 2022-12-20 ENCOUNTER — Ambulatory Visit (INDEPENDENT_AMBULATORY_CARE_PROVIDER_SITE_OTHER): Payer: Medicare Other | Admitting: Family Medicine

## 2022-12-20 VITALS — BP 113/73 | HR 75 | Resp 18 | Ht 66.5 in | Wt 130.0 lb

## 2022-12-20 DIAGNOSIS — E039 Hypothyroidism, unspecified: Secondary | ICD-10-CM | POA: Diagnosis not present

## 2022-12-20 DIAGNOSIS — R590 Localized enlarged lymph nodes: Secondary | ICD-10-CM

## 2022-12-20 DIAGNOSIS — I77819 Aortic ectasia, unspecified site: Secondary | ICD-10-CM | POA: Diagnosis not present

## 2022-12-20 NOTE — Progress Notes (Signed)
Acute Office Visit  Subjective:     Patient ID: ALEESE Williams, female    DOB: May 28, 1950, 72 y.o.   MRN: [id number]  Chief Complaint  Patient presents with   Lymphadenopathy    HPI Patient is in today for a mass on the right side of her neck.  She was seen by her dentist on 12/17/2022 for routine dental appointment.  During the extraoral and intraoral cancer exam, her dentist palpated a fixed lump on the right side of her neck.  She noted that it approximated an existing scar in the area and may be residual scar tissue, but it is a new finding on physical exam that she would like to workup.  She endorses some intermittent discomfort on the right side of her neck but has not been able to correlate it to any particular triggers.  She denies hoarseness, dysphagia.  Review of Systems  Constitutional:  Negative for chills, fever, malaise/fatigue and weight loss.  HENT:  Negative for congestion and ear pain.   Respiratory:  Negative for shortness of breath.   Cardiovascular:  Negative for chest pain and palpitations.  Gastrointestinal:  Negative for abdominal pain and nausea.  Musculoskeletal:  Negative for neck pain.  Neurological:  Negative for dizziness, speech change and headaches.     Objective:    BP 113/73 (BP Location: Left Arm, Patient Position: Sitting, Cuff Size: Normal)   Pulse 75   Resp 18   Ht 5' 6.5" (1.689 m)   Wt 130 lb (59 kg)   SpO2 99%   BMI 20.67 kg/m   Physical Exam Constitutional:      General: She is not in acute distress.    Appearance: Normal appearance.  HENT:     Head: Normocephalic and atraumatic.  Neck:     Thyroid: No thyromegaly.     Vascular: No carotid bruit or JVD.     Trachea: Trachea normal.      Comments: Fixed ~1.5 cm firm mass on the right side of the neck barely posterior to SCM.  Mildly TTP.  No erythema or ecchymosis. Cardiovascular:     Rate and Rhythm: Normal rate and regular rhythm.     Heart sounds: No murmur heard.    No  friction rub. No gallop.  Pulmonary:     Effort: Pulmonary effort is normal. No respiratory distress.     Breath sounds: No wheezing, rhonchi or rales.  Musculoskeletal:     Cervical back: Normal range of motion and neck supple.  Skin:    General: Skin is warm and dry.  Neurological:     Mental Status: She is alert and oriented to person, place, and time.      Assessment & Plan:  LAD (lymphadenopathy) of right cervical region -     CBC with Differential/Platelet; Future -     Comprehensive metabolic panel; Future -     US SOFT TISSUE HEAD & NECK (NON-THYROID); Future  Hypothyroidism, unspecified type -     TSH; Future -     T4, free; Future -     US SOFT TISSUE HEAD & NECK (NON-THYROID); Future  Patient recently had ultrasound of carotid arteries bilaterally.  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  Only the carotid vessels were evaluated, no other soft tissue structures of the neck.  She does also have a history of hypothyroidism.  Starting workup with CBC, CMP, thyroid labs.  Also ordering a stat neck ultrasound for better visualization of the tissue making up the mass.  Once results return for labs and imaging, we will decide whether she is able to continue monitoring with PCP or require a referral to ENT for further evaluation and management.  Return in about 2 months (around 02/19/2023) for follow-up for swollen lymph node, if going to ENT after imaging can cancel this appt.  Joy Quitter, PA

## 2022-12-20 NOTE — Patient Instructions (Signed)
I reviewed the most recent scan of your carotid arteries that was done on 12/12/2022.  They were only looking at the structures of the arteries, they did not look at any of the other tissue of the neck including lymph nodes, muscles, thyroid, etc.  I would recommend repeating an ultrasound of the entire neck to include an more detailed look at all of those structures including the new spot on the right side.

## 2022-12-21 ENCOUNTER — Telehealth: Payer: Self-pay | Admitting: Pulmonary Disease

## 2022-12-21 NOTE — Telephone Encounter (Signed)
Results given to patient 9:12 PM 12/21/2022

## 2022-12-21 NOTE — Telephone Encounter (Addendum)
    REsults given on phone to patient 9:14 PM 12/21/2022 - There is concern of a mass near the area of prior lung cancer surgery. Concern is always lung cancer recurrence. However, can be scar tisse as well esp in fibrosis patients. Sh ei    Plan  - Get PET scan ASAP   - give app to see Icard afte PET scan  I have discussed t his with her   US SOFT TISSUE HEAD & NECK (NON-THYROID)  Result Date: 12/20/2022 CLINICAL DATA:  Right palpable region EXAM: ULTRASOUND OF HEAD/NECK SOFT TISSUES TECHNIQUE: Ultrasound examination of the head and neck soft tissues was performed in the area of clinical concern. COMPARISON:  CTA neck 08/06/2020 FINDINGS: Ectatic atheromatous right carotid artery containing eccentric partially calcified plaque corresponds to the palpable region of patient concern. No mass, cyst, abscess, adenopathy, shadowing calcification, suggestion of foreign body, or additional pathologic findings on ultrasound in the region of concern. Limited contralateral images are unremarkable. IMPRESSION: Atheromatous ectatic right carotid artery. No other pathologic findings. Electronically Signed   By: Corlis Leak M.D.   On: 12/20/2022 15:33      IMPRESSION: 1. Masslike soft tissue measuring 3.0 x 1.6 cm in the posterior left lower lobe encasing a wedge resection suture line is increased from 1.6 x 0.8 cm on 01/18/2022 CT, raising concern for recurrent malignancy. Recommend PET-CT at this time for further evaluation. 2. 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 Findings are consistent with UIP per consensus guidelines: Diagnosis of Idiopathic Pulmonary Fibrosis: An Official ATS/ERS/JRS/ALAT Clinical Practice Guideline. Am Rosezetta Schlatter Crit Care Med Vol 198, Iss 5, [phone], Jan 18 2017. 3. Two-vessel coronary atherosclerosis. 4. Small hiatal hernia. 5.  Aortic  Atherosclerosis (ICD10-I70.0).   These results will be called to the ordering clinician or representative by the Radiologist Assistant, and communication documented in the PACS or [employer].     Electronically Signed   By: Delbert Phenix M.D.   On: 12/21/2022 15:32

## 2022-12-21 NOTE — Telephone Encounter (Signed)
PCCM Elink Call Coverage  Joy Williams contacted urgent line due to anxiety related to her CT Chest from 12/13/22 that resulted tonight.  We reviewed scan results together including soft tissue mass measuring 3.0x1.6 cm in the posterior LLL, previously 1.6 x 0.8cm. Background consistent with IPF, unchanged.  She wanted to know how soon she could get a PET scan. I provided reassurance that I would send a message and contact her primary pulmonologist regarding these results and he would contact her early next week during office hours. She is aware that he is working nights this and may not respond immediately on Monday.  She expressed appreciation for the call.

## 2022-12-23 ENCOUNTER — Telehealth: Payer: Self-pay | Admitting: Internal Medicine

## 2022-12-23 DIAGNOSIS — E039 Hypothyroidism, unspecified: Secondary | ICD-10-CM | POA: Diagnosis not present

## 2022-12-23 DIAGNOSIS — R590 Localized enlarged lymph nodes: Secondary | ICD-10-CM | POA: Diagnosis not present

## 2022-12-23 NOTE — Telephone Encounter (Signed)
PET ordered STAT- msg to PCC's to schedule this ASAP and nodule spot with Icard after.

## 2022-12-23 NOTE — Telephone Encounter (Signed)
Received a call report from [facility] Radiology in regards to the patient's recent CT scan. Below is a copy of the impression:    "IMPRESSION: 1. Masslike soft tissue measuring 3.0 x 1.6 cm in the posterior left lower lobe encasing a wedge resection suture line is increased from 1.6 x 0.8 cm on 01/18/2022 CT, raising concern for recurrent malignancy. Recommend PET-CT at this time for further evaluation. 2. 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 Findings are consistent with UIP per consensus guidelines: Diagnosis of Idiopathic Pulmonary Fibrosis: An Official ATS/ERS/JRS/ALAT Clinical Practice Guideline. Am Rosezetta Schlatter Crit Care Med Vol 198, Iss 5, [phone], Jan 18 2017. 3. Two-vessel coronary atherosclerosis. 4. Small hiatal hernia. 5.  Aortic Atherosclerosis (ICD10-I70.0)."  Per patient's chart, she is already scheduled for a PET scan on 01/07/23.   Will route to Dr. Marchelle Gearing as a FYI.

## 2022-12-23 NOTE — Telephone Encounter (Signed)
PET scan and Icard appt scheduled already

## 2023-01-02 ENCOUNTER — Encounter: Payer: Self-pay | Admitting: Family Medicine

## 2023-01-03 ENCOUNTER — Ambulatory Visit (HOSPITAL_COMMUNITY): Payer: Medicare Other | Attending: Cardiology

## 2023-01-03 DIAGNOSIS — I6523 Occlusion and stenosis of bilateral carotid arteries: Secondary | ICD-10-CM | POA: Diagnosis not present

## 2023-01-03 DIAGNOSIS — Z953 Presence of xenogenic heart valve: Secondary | ICD-10-CM | POA: Diagnosis not present

## 2023-01-03 DIAGNOSIS — E785 Hyperlipidemia, unspecified: Secondary | ICD-10-CM | POA: Diagnosis not present

## 2023-01-03 LAB — ECHOCARDIOGRAM COMPLETE
Area-P 1/2: 3.12 cm2
MV VTI: 1.76 cm2
S' Lateral: 3 cm

## 2023-01-07 ENCOUNTER — Ambulatory Visit (HOSPITAL_COMMUNITY)
Admission: RE | Admit: 2023-01-07 | Discharge: 2023-01-07 | Disposition: A | Discharge status: Home / Self Care | Payer: Medicare Other | Source: Ambulatory Visit | Attending: Internal Medicine | Admitting: Internal Medicine

## 2023-01-07 DIAGNOSIS — C3432 Malignant neoplasm of lower lobe, left bronchus or lung: Secondary | ICD-10-CM | POA: Diagnosis not present

## 2023-01-07 DIAGNOSIS — C3482 Malignant neoplasm of overlapping sites of left bronchus and lung: Secondary | ICD-10-CM | POA: Diagnosis not present

## 2023-01-07 DIAGNOSIS — R918 Other nonspecific abnormal finding of lung field: Secondary | ICD-10-CM | POA: Diagnosis not present

## 2023-01-07 LAB — GLUCOSE, CAPILLARY: Glucose-Capillary: 92 mg/dL (ref 70–99)

## 2023-01-07 MED ORDER — FLUDEOXYGLUCOSE F - 18 (FDG) INJECTION
6.5000 | Freq: Once | INTRAVENOUS | Status: AC
  Administered 2023-01-07: 6.5 via INTRAVENOUS

## 2023-01-08 ENCOUNTER — Telehealth: Payer: Self-pay | Admitting: Internal Medicine

## 2023-01-08 NOTE — Progress Notes (Signed)
Cardiology Office Note:    Date:  01/09/2023   ID:  AALAYSIA BARTHELEMY, DOB 1951-01-20, MRN [id number]  PCP:  Melida Quitter, PA   [facility] Providers Cardiologist:  Parke Poisson, MD {   Referring MD: Melida Quitter, PA    History of Present Illness:    Joy Williams is a 72 y.o. female with a hx of MVP s/p minimally invasive bioprosthetic MVR, carotid artery disease s/p R CEA, scleroderma, cold agglutinin disease, raynaud's disease, UIP, NSCLC, COPD, hypothyroidism and recent admission for STEMI found to have clean coronaries on cath with diagnosis of Takostubo CM discharged on 10/29/20 and recent readmission for dizziness on 11/03/20 who now presents to clinic for follow-up.  Patient was admitted 02/24/19-02/25/19 with chest discomfort.  High-sensitivity troponin levels were elevated (1471 >> 1625 >> 1246 >> 1150).  There was concern for NSTEMI., however, cath demonstrated no CAD.  TTE demonstrated normal LV function and normally functioning mitral valve prosthesis.  She was followed by Dr. Rennis Golden in the hospital and he felt that her presentation was c/w [facility] (myocardial infarction with non-obstructive coronary arteries) or non-ACS troponin elevation.  The possible causes included myocarditis, coronary spasm, cardioembolic event. Underwent cardiac MRI that showed focal subendocardial LGE into mid inferolateral wall consistent with infarct seen on territory with concern for a thromboembolic event given clean cath. She was managed medically and worked with cardiac rehab at that time.   In November of 2020, she developed palpitations. 4 week zio placed with brief runs of SVT but no significant arrhythmias or Afib   Re-admitted to [facility] from 08/06/20-08/10/20 where she presented for acute onset dizziness, left arm incoordination and HA. CT head negative for acute abnormalities. MRI brain without acute infarct but showed multiple scattered remote lacunar infarcts. 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 TTE with EF 65 to 70%, NWMA, mild LVH, moderate left atrial size dilated, mild MVR, no source of emboli noted. She underwent right CEA on 08/09/20 with Dr. Randie Heinz. She was continued on ASA, plavix and statin.  Re-admitted again from 10/24/20-10/29/20 where she presented with chest pain and nausea found to have STE in I and aVL for which a STEMI was called. She was taken to the cath lab which showed 25% pLAD stenosis, otherwise normal coronary arteries, and EF 25-35% with LV gram c/w takotsubo cardiomyopathy. She was started on plavix. TTE showed EF 25-30%, global hypokinesis, G1DD, moderately elevated PA pressures, moderate LAE, and normal structure/function of mitral valve s/p repair/replacement. GDMT was limited due to hypotension and she was discharged on 10/29/20.  Re-presented to the ER on 11/03/20 with dizziness and orthostatic symptoms. Trop 41-->44 which was down from prior admission. Symptoms thought to be due to orthostasis and farxiga discontinued.  Saw [facility] on 11/06/20 where she was doing better. Dizziness improved. Bedside echo with improved LVEF.  She was seen 11/15/2020 where she was feeling much improved since last admission.  Repeat echo 11/22/2020 with LVEF returned to normal 60 to 65%, mild LVH, trivial central MR with no stenosis.  Seen 06/2021 where she was doing well from a CV standpoint. TTE 12/04/21 with LVEF 60-65%, Normal RV, moderate LAE, normal functioning MV prosthesis with mean gradient 2.8mmHG at HR 63bpm.  Has been followed by Pulmonary (Dr. 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  Was last seen in clinic 02/2022 where she was doing well from a CV standpoint.  Presents today after her body PET scan for clarification of involvement of prior  vascular surgery.  I am assuming care from Dr. Shari Prows.  PET scan was ordered in the context of soft tissue left lower lobe finding on chest CT obtained in the workup of her pulmonary fibrosis, also in the context of prior lung cancer with wedge resection.  PET scan felt to represent posttreatment changes or possibly infectious or inflammatory etiology for left lower lobe findings, however in addition and a mobile mass was identified right anterior cervical chain by exam performed by her dentist, and given its proximity to the cranial aspect of her prior right CEA incision, she feared this was related to her prior surgery.  I have done my best today to interpret the findings of her noncardiac PET scan in the context of her noncardiac prior vascular surgery.  I did encourage her to follow-up these findings with her vascular surgeon if concerns remain.  PET scan demonstrated hypermetabolic subcutaneous nodules along the anterior aspect of the base of the ears bilaterally and anterior to the maxillary sinuses bilaterally.  It is my impression that this is unlikely to be related to her prior vascular surgery but given that this is outside the scope of my expertise, I would recommend that she follow this up with her care team.  She did have vascular carotid ultrasounds performed which showed mild residual disease and no concern for masslike lesions documented.  She feels stable from a cardiovascular standpoint with no chest pain and no significant shortness of breath.  Majority of the visit spent discussing noncardiac testing, though we did have an opportunity to review her echocardiogram which was overall reassuring.   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 [facility] (had to have botox injection)   COPD (chronic obstructive pulmonary disease) (HCC)    Family history of adverse reaction to anesthesia    Mother- very sensitive to medication   Heart murmur    MVP   Hypothyroidism    Lung cancer (HCC) 12/30/2014   Synchronous primaries:  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, each treated by wedge resection with post-op adjuvant chemoRx at [facility]   MVP (mitral valve prolapse) 2018   s/p Minimally-Invasive Mitral Valve Replacement w/  [facility] Mitral bovine bioprosthetic tissue valve (size 31mm, model # 7300TFX, serial # [id number])   PONV (postoperative nausea and vomiting)    Raynaud's syndrome    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   31 mm [facility] mitral bovine bioprosthetic tissue valve placed via right mini thoracotomy approach   Severe mitral regurgitation 11/15/2014   Shortness of breath dyspnea    with exertion   STD (sexually transmitted disease)    STEMI (ST elevation myocardial infarction) (HCC) 10/24/2020   Telangiectasia     Past Surgical History:  Procedure Laterality Date   BACK SURGERY     x 3  Disectomy   BREAST BIOPSY Left  CARDIAC CATHETERIZATION N/A 03/08/2016   Procedure: Right/Left Heart Cath and Coronary Angiography;  Surgeon: Tonny Bollman, MD;  Location: [facility] INVASIVE CV LAB;  Service: Cardiovascular;  Laterality: N/A;   CLAVICLE SURGERY Left 2013   plate to left collar bone   ENDARTERECTOMY Right 08/09/2020   Procedure: RIGHT CAROTID ENDARTERECTOMY;  Surgeon: Maeola Harman, MD;  Location: [facility] OR;  Service: Vascular;  Laterality: Right;   LAPAROSCOPY     ? reason-age 40    LEFT HEART CATH AND CORONARY ANGIOGRAPHY N/A 02/25/2019   Procedure: LEFT HEART CATH AND CORONARY ANGIOGRAPHY;  Surgeon: Corky Crafts, MD;  Location: [facility] INVASIVE CV LAB;  Service: Cardiovascular;  Laterality: N/A;   LEFT HEART CATH AND  CORONARY ANGIOGRAPHY N/A 10/24/2020   Procedure: LEFT HEART CATH AND CORONARY ANGIOGRAPHY;  Surgeon: Corky Crafts, MD;  Location: [facility] INVASIVE CV LAB;  Service: Cardiovascular;  Laterality: N/A;   LUNG CANCER SURGERY     MITRAL VALVE REPAIR Right 06/27/2016   Procedure: MINIMALLY INVASIVE MITRAL VALVE REPLACEMENT;  Surgeon: Purcell Nails, MD;  Location: [facility] OR;  Service: Open Heart Surgery;  Laterality: Right;   TEE WITHOUT CARDIOVERSION N/A 02/22/2016   Procedure: TRANSESOPHAGEAL ECHOCARDIOGRAM (TEE);  Surgeon: Lars Masson, MD;  Location: [facility] ENDOSCOPY;  Service: Cardiovascular;  Laterality: N/A;   TEE WITHOUT CARDIOVERSION N/A 06/27/2016   Procedure: TRANSESOPHAGEAL ECHOCARDIOGRAM (TEE);  Surgeon: Purcell Nails, MD;  Location: [facility] OR;  Service: Open Heart Surgery;  Laterality: N/A;   TONSILLECTOMY     VIDEO ASSISTED THORACOSCOPY (VATS)/WEDGE RESECTION Left 12/30/2014   Bronchoscopy, Mediastinoscopy, Left VATS for Wedge resection LUL x2 adn LLL x1 - Dr. Ewing Schlein at [facility]   VOCAL CORD INJECTION Left 2017   injected with botox   wrist surgery Left 2015   plate to wrist     Current Medications: Current Meds  Medication Sig   acetaminophen (TYLENOL) 500 MG tablet Take 1,000 mg by mouth every 6 (six) hours as needed for mild pain or headache.   Cholecalciferol (VITAMIN D) 125 MCG (5000 UT) CAPS Take 5,000 Units by mouth daily.   clopidogrel (PLAVIX) 75 MG tablet Take 1 tablet (75 mg total) by mouth daily.   EPINEPHrine (EPIPEN 2-PAK) 0.3 mg/0.3 mL IJ SOAJ injection Inject 0.3 mg into the muscle as needed for anaphylaxis.   ferrous sulfate 325 (65 FE) MG tablet Take 1 tablet (325 mg total) by mouth daily.   hydrALAZINE (APRESOLINE) 25 MG tablet Take 1 tablet (25 mg total) by mouth daily as needed (for systolic BP>150).   levothyroxine (SYNTHROID) 175 MCG tablet TAKE 1 TABLET BY MOUTH EVERY DAY IN THE MORNING   MELATONIN PO Take 1 tablet by mouth at bedtime as needed (sleep).   Multiple  Vitamins-Minerals (MULTIVITAMIN WITH MINERALS) tablet Take 1 tablet by mouth daily.   rosuvastatin (CRESTOR) 20 MG tablet Take 1 tablet (20 mg total) by mouth every evening.   TRELEGY ELLIPTA 100-62.5-25 MCG/ACT AEPB INHALE 1 PUFF BY MOUTH EVERY DAY   Turmeric 500 MG CAPS Take 500 mg by mouth daily.   vitamin B-12 (CYANOCOBALAMIN) 1000 MCG tablet Take 1 tablet (1,000 mcg total) by mouth daily.     Allergies:   Penicillins and Bee venom   Social History   Socioeconomic History   Marital status: Divorced    Spouse name: Not on file   Number of children: Not on file   Years of education: 16   Highest education level: Not on file  Occupational  History   Occupation: Retired  Tobacco Use   Smoking status: Some Days    Current packs/day: 1.00    Average packs/day: 1 pack/day for 39.6 years (39.6 ttl pk-yrs)    Types: Cigarettes    Start date: 05/21/1983    Passive exposure: Never   Smokeless tobacco: Never   Tobacco comments:    Currently smoking 5 cigs a day as of 11/19/22 BT CMA  Vaping Use   Vaping status: Never Used  Substance and Sexual Activity   Alcohol use: Yes    Alcohol/week: 2.0 standard drinks of alcohol    Types: 1 Shots of liquor, 1 Standard drinks or equivalent per week    Comment: occ   Drug use: No   Sexual activity: Not Currently    Partners: Male    Birth control/protection: Post-menopausal  Other Topics Concern   Not on file  Social History Narrative   Not on file   Social Determinants of Health   Financial Resource Strain: Low Risk  (09/04/2022)   Overall Financial Resource Strain (CARDIA)    Difficulty of Paying Living Expenses: Not hard at all  Food Insecurity: No Food Insecurity (09/04/2022)   Hunger Vital Sign    Worried About Running Out of Food in the Last Year: Never true    Ran Out of Food in the Last Year: Never true  Transportation Needs: No Transportation Needs (09/04/2022)   PRAPARE - Administrator, Civil Service (Medical): No     Lack of Transportation (Non-Medical): No  Physical Activity: Insufficiently Active (09/04/2022)   Exercise Vital Sign    Days of Exercise per Week: 4 days    Minutes of Exercise per Session: 30 min  Stress: No Stress Concern Present (09/04/2022)   [employer] of Occupational Health - Occupational Stress Questionnaire    Feeling of Stress : Not at all  Social Connections: Moderately Integrated (09/04/2022)   Social Connection and Isolation Panel [NHANES]    Frequency of Communication with Friends and Family: More than three times a week    Frequency of Social Gatherings with Friends and Family: Twice a week    Attends Religious Services: More than 4 times per year    Active Member of [employer] or Organizations: Yes    Attends Engineer, structural: More than 4 times per year    Marital Status: Divorced     Family History: The patient's family history includes Dementia in her father; Healthy in her daughter and son; Mitral valve prolapse in her brother, mother, and sister; Prostate cancer in her father. There is no history of Colon cancer, Colon polyps, Rectal cancer, Esophageal cancer, Stomach cancer, or Heart attack.  ROS:   Please see the history of present illness.       EKGs/Labs/Other Studies Reviewed:    The following studies were reviewed today: Cardiac Studies & Procedures   CARDIAC CATHETERIZATION  CARDIAC CATHETERIZATION 10/24/2020  Narrative  Prox LAD lesion is 25% stenosed. No significant CAD.  The left ventricular systolic function is normal.  LV end diastolic pressure is mildly elevated.  The left ventricular ejection fraction is 25-35% by visual estimate.  There is no aortic valve stenosis.  Prior mitral valve surgery.  Despite lateral ST elevation, we only found nonobstructive coronary artery disease.  Left ventriculogram fits with a pattern of Takotsubo cardiomyopathy.  LV function significantly different from her prior catheterization.  She will  need aggressive medical therapy for LV dysfunction.  I discussed the  findings with her daughter.  Findings Coronary Findings Diagnostic  Dominance: Right  Left Anterior Descending Vessel is angiographically normal. The LAD is a large vessel with no obstruction. The vessel wraps around the LV apex. The first diagonal is a large vessel with no disease noted. Vessel is angiographically normal. Prox LAD lesion is 25% stenosed.  Left Circumflex Vessel is angiographically normal. The circumflex is widely patent and smooth throughout. There is no obstructive disease. The first OM is tiny. The second OM supplies twin branches to the posterolateral wall.  Right Coronary Artery Vessel is angiographically normal. Large, dominant vessel. The vessel is smooth throughout with no evidence of atherosclerotic disease. The PDA and PLA branches are patent.  Intervention  No interventions have been documented.   CARDIAC CATHETERIZATION  CARDIAC CATHETERIZATION 02/25/2019  Narrative  The left ventricular systolic function is normal.  LV end diastolic pressure is normal.  The left ventricular ejection fraction is 55-65% by visual estimate.  There is no aortic valve stenosis.  No angiographically apparent coronary artery disease.  No angiographically apparent coronary artery disease.  Further imaging per Dr. Rennis Golden.  Results communicated to her daughter, Judeth Cornfield.  Findings Coronary Findings Diagnostic  Dominance: Right  Left Anterior Descending Vessel is angiographically normal. The LAD is a large vessel with no obstruction. The vessel wraps around the LV apex. The first diagonal is a large vessel with no disease noted. Vessel is angiographically normal.  Left Circumflex Vessel is angiographically normal. The circumflex is widely patent and smooth throughout. There is no obstructive disease. The first OM is tiny. The second OM supplies twin branches to the posterolateral wall.  Right  Coronary Artery Vessel is angiographically normal. Large, dominant vessel. The vessel is smooth throughout with no evidence of atherosclerotic disease. The PDA and PLA branches are patent.  Intervention  No interventions have been documented.     ECHOCARDIOGRAM  ECHOCARDIOGRAM COMPLETE 01/03/2023  Narrative ECHOCARDIOGRAM REPORT    Patient Name:   Joy Williams Even Date of Exam: 01/03/2023 Medical Rec #:  [id number]        Height:       66.5 in Accession #:    [id number]       Weight:       130.0 lb Date of Birth:  1951-01-04        BSA:          1.675 m Patient Age:    71 years         BP:           113/73 mmHg Patient Gender: F                HR:           63 bpm. Exam Location:  [address]  Procedure: 2D Echo, Cardiac Doppler, Color Doppler, Strain Analysis and 3D Echo  Indications:    S/p Mitral Valve Replacement Z95.2  History:        Patient has prior history of Echocardiogram examinations, most recent 12/04/2021. CAD and Previous Myocardial Infarction, COPD; Risk Factors:Dyslipidemia.  Mitral Valve: 31 mm Edwards Magna Bovine Tissue Valve bioprosthetic valve valve is present in the mitral position. Procedure Date: 06/27/2016.  Sonographer:    Thurman Coyer RDCS Referring Phys: [id number] HEATHER E [place]  IMPRESSIONS   1. Left ventricular ejection fraction, by estimation, is 55 to 60%. The left ventricle has normal function. The left ventricle has no regional wall motion abnormalities. There is mild concentric left ventricular hypertrophy. Left  ventricular diastolic parameters are indeterminate. 2. Right ventricular systolic function is normal. The right ventricular size is normal. Estimated PA systolic pressure 32 mmHg. 3. Left atrial size was moderately dilated. 4. Possible PFO. 5. Bioprosthetic mitral valve with mean gradient 3 mmHg, no significant stenosis. No significant regurgitation. 6. The aortic valve is tricuspid. There is moderate calcification of  the aortic valve. Aortic valve regurgitation is not visualized. Aortic valve sclerosis/calcification is present, without any evidence of aortic stenosis. 7. The inferior vena cava is normal in size with greater than 50% respiratory variability, suggesting right atrial pressure of 3 mmHg.  FINDINGS Left Ventricle: Left ventricular ejection fraction, by estimation, is 55 to 60%. The left ventricle has normal function. The left ventricle has no regional wall motion abnormalities. The left ventricular internal cavity size was normal in size. There is mild concentric left ventricular hypertrophy. Left ventricular diastolic parameters are indeterminate.  Right Ventricle: The right ventricular size is normal. No increase in right ventricular wall thickness. Right ventricular systolic function is normal. There is normal pulmonary artery systolic pressure. The tricuspid regurgitant velocity is 2.69 m/s, and with an assumed right atrial pressure of 3 mmHg, the estimated right ventricular systolic pressure is 31.9 mmHg.  Left Atrium: Left atrial size was moderately dilated.  Right Atrium: Right atrial size was normal in size.  Pericardium: There is no evidence of pericardial effusion.  Mitral Valve: Bioprosthetic mitral valve. Mean gradient 3 mmHg, no significant stenosis. No significant regurgitation. The mitral valve has been repaired/replaced. No evidence of mitral valve regurgitation. There is a 31 mm Edwards Magna Bovine Tissue Valve bioprosthetic valve present in the mitral position. Procedure Date: 06/27/2016. No evidence of mitral valve stenosis. MV peak gradient, 7.1 mmHg. The mean mitral valve gradient is 3.0 mmHg.  Tricuspid Valve: The tricuspid valve is normal in structure. Tricuspid valve regurgitation is trivial.  Aortic Valve: The aortic valve is tricuspid. There is moderate calcification of the aortic valve. Aortic valve regurgitation is not visualized. Aortic valve sclerosis/calcification  is present, without any evidence of aortic stenosis.  Pulmonic Valve: The pulmonic valve was normal in structure. Pulmonic valve regurgitation is trivial.  Aorta: The aortic root is normal in size and structure.  Venous: The inferior vena cava is normal in size with greater than 50% respiratory variability, suggesting right atrial pressure of 3 mmHg.  IAS/Shunts: Possible PFO.   LEFT VENTRICLE PLAX 2D LVIDd:         4.80 cm   Diastology LVIDs:         3.00 cm   LV e' medial:    4.60 cm/s LV PW:         1.10 cm   LV E/e' medial:  23.9 LV IVS:        1.10 cm   LV e' lateral:   6.12 cm/s LVOT diam:     2.00 cm   LV E/e' lateral: 18.0 LV SV:         71 LV SV Index:   43 LVOT Area:     3.14 cm  3D Volume EF: 3D EF:        61 % LV EDV:       106 ml LV ESV:       42 ml LV SV:        64 ml  RIGHT VENTRICLE RV Basal diam:  3.20 cm RV Mid diam:    3.20 cm RV S prime:     10.10 cm/s TAPSE (M-mode):  1.8 cm  LEFT ATRIUM             Index        RIGHT ATRIUM           Index LA diam:        4.20 cm 2.51 cm/m   RA Area:     16.10 cm LA Vol (A2C):   67.7 ml 40.43 ml/m  RA Volume:   39.30 ml  23.47 ml/m LA Vol (A4C):   76.4 ml 45.62 ml/m LA Biplane Vol: 73.8 ml 44.07 ml/m AORTIC VALVE LVOT Vmax:   119.00 cm/s LVOT Vmean:  68.900 cm/s LVOT VTI:    0.227 m  AORTA Ao Root diam: 3.10 cm Ao Asc diam:  3.50 cm  MITRAL VALVE                TRICUSPID VALVE MV Area (PHT): 3.12 cm     TR Peak grad:   28.9 mmHg MV Area VTI:   1.76 cm     TR Vmax:        269.00 cm/s MV Peak grad:  7.1 mmHg MV Mean grad:  3.0 mmHg     SHUNTS MV Vmax:       1.33 m/s     Systemic VTI:  0.23 m MV Vmean:      76.0 cm/s    Systemic Diam: 2.00 cm MV Decel Time: 243 msec MV E velocity: 110.00 cm/s MV A velocity: 83.20 cm/s MV E/A ratio:  1.32  Dalton McleanMD Electronically signed by Wilfred Lacy Signature Date/Time: 01/03/2023/6:21:04 PM    Final   TEE  ECHO TEE 06/27/2016  Interpretation  Summary  Left ventricle: Normal cavity size and wall thickness. LV systolic function is normal with an EF of 60-65%. There are no obvious wall motion abnormalities. No thrombus present. No mass present.  Septum: No Patent Foramen Ovale present. The interatrial septum bows to the left consistent with high right atrial pressure.  Left atrium: Patent foramen ovale not present.  Aortic valve: The valve is trileaflet. No stenosis. Trace regurgitation. No AV vegetation.  Mitral valve: Leaflets appear myxomatous. Severe leaflet thickening is present. Moderate leaflet calcification is present. Moderate mitral annular calcification. Mild stenosis. Severe regurgitation. There is prolapse of the medial segment (A3) and the middle segment (A2) of the anterior mitral leaflet. There is prolapse of the medial scallop and the middle scallop of the posterior mitral leaflet.  Right ventricle: Normal cavity size, wall thickness and ejection fraction.  Tricuspid valve: Trace regurgitation. The tricuspid valve regurgitation jet is central.  Pulmonic valve: Trace regurgitation.  Mitral valve: There is a bioprosthetic mitral valve.   MONITORS  LONG TERM MONITOR (3-14 DAYS) 05/10/2019  Narrative  Sinus bradycardia to sinus tachycardia.  No atrial fibrillation.  3 runs of nonsustained ventricular tachycardia's with longest lasting 7 beats.  15 very short runs of SVTs, most probably atrial tachycardia.  Patient had a min HR of 53 bpm, max HR of 182 bpm, and avg HR of 73 bpm. Predominant underlying rhythm was Sinus Rhythm.  There is no evidence for atrial fibrillation. 3 Ventricular Tachycardia runs, the longest lasting 7 beats with an avg rate of 105 bpm.    CARDIAC MRI  MR CARDIAC MORPHOLOGY W WO CONTRAST 03/17/2019  Narrative CLINICAL DATA:  Elevated troponin, normal coronaries.  EXAM: CARDIAC MRI  TECHNIQUE: The patient was scanned on a 1.5 Tesla GE magnet. A dedicated cardiac coil was  used. Functional imaging was done using  Fiesta sequences. 2,3, and 4 chamber views were done to assess for RWMA's. Modified Simpson's rule using a short axis stack was used to calculate an ejection fraction on a dedicated work Research officer, trade union. The patient received 7 cc of Gadavist. After 10 minutes inversion recovery sequences were used to assess for infiltration and scar tissue.  FINDINGS: Limited images of the lung fields showed mild basal atelectasis.  Normal left ventricular size and wall thickness. Mid inferolateral mild hypokinesis, the remainder of the wall segments thickened normally. EF 73%. Normal right ventricular size and systolic function, EF 51%. Mild left atrial enlargement. Normal right atrial size. There was a bioprosthetic mitral valve present. Unable to comment on mitral regurgitation due to artifact from valve. The aortic valve was poorly visualized due to artifact from the bioprosthetic mitral valve.  On delayed enhancement imaging, there was focal subendocardial 51-75% wall thickness late gadolinium enhancement in the mid inferolateral wall.  Measurements:  LVEDV 81 mL  LVSV 59 mL  LVEF 73%  RVEDV 93 mL  RVSV 48 mL  RVEF 51%  IMPRESSION: 1. Normal LV size and systolic function, EF 73%. Mid inferolateral wall mild hypokinesis.  2.  Normal RV size and systolic function, EF 51%.  3. Bioprosthetic mitral valve, unable to comment on this due to artifact.  4. Delayed enhancement pattern is suggestive of focal infarct (subendocardial LGE) in left circumflex territory (?OM territory).  Dalton Mclean   Electronically Signed By: Marca Ancona M.D. On: 03/18/2019 15:06          EKG:  No new tracing  Recent Labs: 03/18/2022: Pro B Natriuretic peptide (BNP) 109.0 12/23/2022: ALT 20; BUN 13; Creatinine, Ser 0.65; Hemoglobin 12.9; Platelets 216; Potassium 4.7; Sodium 129; TSH 0.534  Recent Lipid Panel    Component Value Date/Time    CHOL 142 08/28/2022 0831   CHOL 177 10/28/2014 1025   TRIG 112 08/28/2022 0831   TRIG 115 10/28/2014 1025   HDL 67 08/28/2022 0831   HDL 70 10/28/2014 1025   CHOLHDL 2.1 08/28/2022 0831   CHOLHDL 2.3 10/24/2020 1545   VLDL 20 10/24/2020 1545   LDLCALC 55 08/28/2022 0831   LDLCALC 84 10/28/2014 1025   LDLDIRECT 63 06/15/2020 1446         Physical Exam:    VS:  BP (!) 164/82 (BP Location: Left Arm, Patient Position: Sitting, Cuff Size: Normal)   Pulse 73   Ht 5' 6.5" (1.689 m)   Wt 129 lb 9.6 oz (58.8 kg)   SpO2 100%   BMI 20.60 kg/m     Wt Readings from Last 3 Encounters:  01/09/23 129 lb 9.6 oz (58.8 kg)  12/20/22 130 lb (59 kg)  11/19/22 132 lb 3.2 oz (60 kg)     Constitutional: No acute distress Eyes: sclera non-icteric, normal conjunctiva and lids ENMT: normal dentition, moist mucous membranes.  Right preauricular there are 2 small firm masses. Cardiovascular: regular rhythm, normal rate, 1/6 systolic murmur.  The prior right carotid artery incision appears well-healed.  At the more cranial aspect of this incision there appears to be a subcutaneous firm nodule, but does not appear to be related to the incision.  It does appear to be in the distribution of the anterior cervical chain. Respiratory: bibasilar crackles GI : normal bowel sounds, soft and nontender. No distention.   MSK: extremities warm, well perfused. No edema.  NEURO: grossly nonfocal exam, moves all extremities. PSYCH: alert and oriented x 3, normal mood and affect.  ASSESSMENT:    1. Bilateral carotid artery stenosis   2. Hyperlipidemia LDL goal <70   3. S/P mitral valve replacement with bioprosthetic valve   4. Nonobstructive atherosclerosis of coronary artery   5. Takotsubo cardiomyopathy   6. TIA (transient ischemic attack)   7. Orthostatic hypertension   8. Chronic systolic heart failure (HCC)   9. History of mitral valve replacement   10. Mitral valve insufficiency, unspecified  etiology     PLAN:    # Hypermetabolic subcutaneous nodules anterior to the ears -These lesions in addition to the one along the right side of the neck do not clearly seem associated with her prior right CEA.  She does have follow-up with vascular surgery and this can be investigated in further detail.  We discussed that at her follow-up visit with Dr. Marchelle Gearing PET results will be reviewed in the context of which it was ordered.  Recent imaging and echocardiogram do not demonstrate a change in cardiovascular status.  #Non-Ischemic CM:  #Takotsubo cardiomyopathy:  Patient had a recent admission from 10/24/20-10/29/20 where she presented with chest pain, nausea, and vomiting and found to have anterolateral STE on EKG. HsTrop increased from 916>4990. She underwent emergent cardiac catheterization which showed 25% pLAD stenosis, otherwise normal coronary arteries, and EF 25-35% with LV gram c/w takotsubo cardiomyopathy. She was started on plavix for conservative management. TTE showed EF 25-30%, global hypokinesis, G1DD, moderately elevated PA pressures, moderate LAE, and normal structure/function of mitral valve s/p repair/replacement. GDMT has been limited by symptomatic hypotension. Repeat echo 11/22/2020 with LVEF returned to normal 60 to 65%, mild LVH, trivial central MR with no stenosis. -TTE with improved EF 60-65% - Unable to tolerate GDMT due to symptomatic hypotension - Continue plavix 75mg  daily (was on previously due to suspected thromboembolic NSTEMI in the past) - Monitor daily weights  #MVP c/b severe mitral regurgitation s/p bioprosthetic MVR in 2018:  Stable gradients on most recent TTE with mean gradient 2.42mmHg. Trivial MR. - Continue routine monitoring with serial echoes   #History of NSTEMI (non-ST elevated myocardial infarction)  Presented with chest pain in 2020. Cath without obstructive disease, however, MRI showed focal LGE in the mid inferolateral wall highly suspicious for  thromboembolic event. TTE with LVEF was 60 to 65%. Completed cardiac rehab. Had mild troponin elevation with recent hospitalization with peak 140-->130. No anginal symptoms. Suspect demand in the setting of TIA. Given recent negative cath and absence of chest pain, no further work-up needed at this time. -Continue plavix 75mg  daily (favored over ASA given concern for TIA at that time) -Continue crestor 20mg  daily -Off BB due to orthostatsis -Stopped linisopril due to orthostasis  #Episodic HTN: Likely driven by increased salt intake. Due to orthostatic hypotension, she was unable to tolerate BP meds. Will give prn hydralazine as needed. Discussed low Na diet and only use hydralazine if SBP persistently above 150s. -Start hydralazine 25mg  prn for SBP>150 -Discussed low Na diet at length -Cannot tolerate scheduled HTN med due to orthostasis.  #Orthostatic Hypotension: Improved with cessation of antihypertensives and jardiance. -Compression socks -Elevate legs at end of the day -Slow position changes   #Carotid artery disease s/p right CEA 07/2020: followed by Dr. Randie Heinz outpatient -Carotid ultrasound with mild residual disease bilaterally. - She will schedule with Dr. Randie Heinz for follow-up - Continue plavix 75mg  daily and crestor 20mg  daily as above   #Iron deficiency:  - Continue po iron - Follow-up with PCP for further management.     #UIP:  -  Continue to follow with pulmonology   #Hyperlipidemia LDL goal <70 LDL optimal on most recent lab work with LDL 63   -Continue crestor 20mg  daily  #Scleroderma with CREST: #Pulmonary Fibrosis: Most recent TTE with normal RV and normal RVSP. -Follows with Pulm and Rheum -Continue yearly monitoring of TTEs for RV and PASP  #Tobacco Abuse: Quit successfully. -Continue nicotine patches   Medication Adjustments/Labs and Tests Ordered: Current medicines are reviewed at length with the patient today.  Concerns regarding medicines are outlined  above.  No orders of the defined types were placed in this encounter.  No orders of the defined types were placed in this encounter.  Patient Instructions  Medication Instructions:  Your physician recommends that you continue on your current medications as directed. Please refer to the Current Medication list given to you today.  *If you need a refill on your cardiac medications before your next appointment, please call your pharmacy*   Follow-Up: At [facility], you and your health needs are our priority.  As part of our continuing mission to provide you with exceptional heart care, we have created designated Provider Care Teams.  These Care Teams include your primary Cardiologist (physician) and Advanced Practice Providers (APPs -  Physician Assistants and Nurse Practitioners) who all work together to provide you with the care you need, when you need it.  We recommend signing up for the patient portal called "MyChart".  Sign up information is provided on this After Visit Summary.  MyChart is used to connect with patients for Virtual Visits (Telemedicine).  Patients are able to view lab/test results, encounter notes, upcoming appointments, etc.  Non-urgent messages can be sent to your provider as well.   To learn more about what you can do with MyChart, go to [email or web address].    Your next appointment:   12 month(s)  Provider:   Parke Poisson, MD    Total time of encounter: 30 minutes total time of encounter, including 20 minutes spent in face-to-face patient care on the date of this encounter. This time includes coordination of care and counseling regarding above mentioned problem list. Remainder of non-face-to-face time involved reviewing chart documents/testing relevant to the patient encounter and documentation in the medical record. I have independently reviewed documentation from referring provider.   Weston Brass, MD, [facility]  [facility]

## 2023-01-08 NOTE — Telephone Encounter (Signed)
Pt recd her PET Scan Results via MyChart and will like someone to go over them with her.

## 2023-01-08 NOTE — Telephone Encounter (Signed)
PET CT was ordered by pulmonologist. Advised patient he needs to review the test. She will give his office a call

## 2023-01-08 NOTE — Telephone Encounter (Signed)
  The patient recently had a PET CT and is concerned about her carotid. She was previously under the care of Dr. Shari Prows, who referred her to Dr. Jacques Navy. She would like to know if Dr. Jacques Navy can review her PET CT results and provide recommendations regarding her carotid

## 2023-01-08 NOTE — Telephone Encounter (Signed)
Newnam, Lucie Leather, LPN Pt added on for tomorrow at 9am with Dr. Jacques Navy

## 2023-01-09 ENCOUNTER — Encounter: Payer: Self-pay | Admitting: Internal Medicine

## 2023-01-09 ENCOUNTER — Ambulatory Visit: Payer: Medicare Other | Attending: Internal Medicine | Admitting: Internal Medicine

## 2023-01-09 VITALS — BP 164/82 | HR 73 | Ht 66.5 in | Wt 129.6 lb

## 2023-01-09 DIAGNOSIS — Z952 Presence of prosthetic heart valve: Secondary | ICD-10-CM | POA: Diagnosis not present

## 2023-01-09 DIAGNOSIS — I6523 Occlusion and stenosis of bilateral carotid arteries: Secondary | ICD-10-CM | POA: Diagnosis not present

## 2023-01-09 DIAGNOSIS — G459 Transient cerebral ischemic attack, unspecified: Secondary | ICD-10-CM

## 2023-01-09 DIAGNOSIS — I2510 Atherosclerotic heart disease of native coronary artery without angina pectoris: Secondary | ICD-10-CM | POA: Diagnosis not present

## 2023-01-09 DIAGNOSIS — I340 Nonrheumatic mitral (valve) insufficiency: Secondary | ICD-10-CM

## 2023-01-09 DIAGNOSIS — I5022 Chronic systolic (congestive) heart failure: Secondary | ICD-10-CM

## 2023-01-09 DIAGNOSIS — I10 Essential (primary) hypertension: Secondary | ICD-10-CM

## 2023-01-09 DIAGNOSIS — E785 Hyperlipidemia, unspecified: Secondary | ICD-10-CM

## 2023-01-09 DIAGNOSIS — Z953 Presence of xenogenic heart valve: Secondary | ICD-10-CM | POA: Diagnosis not present

## 2023-01-09 DIAGNOSIS — I5181 Takotsubo syndrome: Secondary | ICD-10-CM | POA: Diagnosis not present

## 2023-01-09 NOTE — Patient Instructions (Signed)
Medication Instructions:  Your physician recommends that you continue on your current medications as directed. Please refer to the Current Medication list given to you today.  *If you need a refill on your cardiac medications before your next appointment, please call your pharmacy*   Follow-Up: At [facility], you and your health needs are our priority.  As part of our continuing mission to provide you with exceptional heart care, we have created designated Provider Care Teams.  These Care Teams include your primary Cardiologist (physician) and Advanced Practice Providers (APPs -  Physician Assistants and Nurse Practitioners) who all work together to provide you with the care you need, when you need it.  We recommend signing up for the patient portal called "MyChart".  Sign up information is provided on this After Visit Summary.  MyChart is used to connect with patients for Virtual Visits (Telemedicine).  Patients are able to view lab/test results, encounter notes, upcoming appointments, etc.  Non-urgent messages can be sent to your provider as well.   To learn more about what you can do with MyChart, go to [email or web address].    Your next appointment:   12 month(s)  Provider:   Gayatri A Acharya, MD    

## 2023-01-10 NOTE — Progress Notes (Signed)
Joy Williams, this is DR Marchelle Gearing. . Not big areas of lighting up on PET scan other than in the ear. Please keep appt with Dr Tonia Brooms. He will likkely just follow along . Ask him about the ear too. Thanks, Dr R  [phone]   IMPRESSION: 1. Ill-defined airspace consolidation in the left lower lobe demonstrates mild hypermetabolism. Favor post treatment changes or inflammatory or infectious process but close follow-up is suggested. 2. No mediastinal or hilar lymphadenopathy. 3. No findings for abdominal/pelvic metastatic disease or osseous metastatic disease. 4. Hypermetabolic subcutaneous nodules along the anterior aspect of the base of the ears bilaterally and anterior to the maxillary sinuses bilaterally. Recommend correlation with physical exam, clinical findings and biopsy if possible.   Electronically Signed   By: Rudie Meyer M.D.   On: 01/07/2023 19:11

## 2023-01-13 ENCOUNTER — Ambulatory Visit: Payer: Medicare Other | Admitting: Pulmonary Disease

## 2023-01-13 ENCOUNTER — Encounter: Payer: Self-pay | Admitting: Pulmonary Disease

## 2023-01-13 VITALS — BP 102/60 | HR 71 | Ht 66.5 in | Wt 130.6 lb

## 2023-01-13 DIAGNOSIS — Z8679 Personal history of other diseases of the circulatory system: Secondary | ICD-10-CM

## 2023-01-13 DIAGNOSIS — R911 Solitary pulmonary nodule: Secondary | ICD-10-CM | POA: Diagnosis not present

## 2023-01-13 DIAGNOSIS — M341 CR(E)ST syndrome: Secondary | ICD-10-CM | POA: Diagnosis not present

## 2023-01-13 DIAGNOSIS — Z85118 Personal history of other malignant neoplasm of bronchus and lung: Secondary | ICD-10-CM

## 2023-01-13 MED ORDER — BUPROPION HCL ER (SR) 150 MG PO TB12: 150.0000 mg | ORAL_TABLET | Freq: Two times a day (BID) | ORAL | 5 refills | Status: DC

## 2023-01-13 NOTE — Progress Notes (Signed)
Synopsis: Referred in Aug 2024 for lung nodule by Melida Quitter, PA  Subjective:   PATIENT ID: Joy Williams GENDER: female DOB: [birth year], MRN: [id number]  Chief Complaint  Patient presents with   Consult    Consult on PET scan, lung nodule.    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  She has had wedge resections in the past.  Now presents with follow-up CT imaging that shows a area within the lower lobe that has slowly been getting bigger with low-level metabolic uptake.  She does have other multiple medical comorbidities stroke, MI, valve replacement currently on Plavix.    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 [facility] (had to have botox injection)   COPD (chronic obstructive pulmonary disease) (HCC)    Family history of adverse reaction to anesthesia    Mother- very sensitive to medication   Heart murmur    MVP   Hypothyroidism    Lung cancer (HCC) 12/30/2014   Synchronous primaries:  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, each treated by wedge resection with [facility]-op adjuvant [facility] at [facility]   MVP (mitral valve prolapse) 2018   s/p Minimally-Invasive Mitral Valve Replacement w/  [facility] Mitral bovine bioprosthetic tissue valve (size 31mm, model # 7300TFX, serial # [id number])   PONV (postoperative nausea and vomiting)    Raynaud's syndrome    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   31 mm Edwards Magna mitral bovine bioprosthetic tissue valve placed via  right mini thoracotomy approach   Severe mitral regurgitation 11/15/2014   Shortness of breath dyspnea    with exertion   STD (sexually transmitted disease)    STEMI (ST elevation myocardial infarction) (HCC) 10/24/2020   Telangiectasia      Family History  Problem Relation Age of Onset   Mitral valve prolapse Mother    Dementia Father    Prostate cancer Father    Mitral valve prolapse Sister    Mitral valve prolapse Brother    Healthy Son    Healthy Daughter    Colon cancer Neg Hx    Colon polyps Neg Hx    Rectal cancer Neg Hx    Esophageal cancer Neg Hx    Stomach cancer Neg Hx    Heart attack Neg Hx      Past Surgical History:  Procedure Laterality Date   BACK SURGERY     x 3  Disectomy   BREAST BIOPSY Left    CARDIAC CATHETERIZATION N/A 03/08/2016   Procedure: Right/Left Heart Cath and Coronary Angiography;  Surgeon: Tonny Bollman, MD;  Location: [facility] INVASIVE CV LAB;  Service: Cardiovascular;  Laterality: N/A;   CLAVICLE SURGERY Left 2013   plate to left collar bone   ENDARTERECTOMY Right 08/09/2020   Procedure: RIGHT CAROTID ENDARTERECTOMY;  Surgeon: Maeola Harman, MD;  Location: [facility] OR;  Service: Vascular;  Laterality: Right;   LAPAROSCOPY     ?  reason-age 33    LEFT HEART CATH AND CORONARY ANGIOGRAPHY N/A 02/25/2019   Procedure: LEFT HEART CATH AND CORONARY ANGIOGRAPHY;  Surgeon: Corky Crafts, MD;  Location: [facility] INVASIVE CV LAB;  Service: Cardiovascular;  Laterality: N/A;   LEFT HEART CATH AND CORONARY ANGIOGRAPHY N/A 10/24/2020   Procedure: LEFT HEART CATH AND CORONARY ANGIOGRAPHY;  Surgeon: Corky Crafts, MD;  Location: [facility] INVASIVE CV LAB;  Service: Cardiovascular;  Laterality: N/A;   LUNG CANCER SURGERY     MITRAL VALVE REPAIR Right 06/27/2016   Procedure: MINIMALLY INVASIVE MITRAL VALVE REPLACEMENT;  Surgeon: Purcell Nails, MD;  Location: [facility] OR;  Service: Open Heart Surgery;  Laterality: Right;   TEE WITHOUT CARDIOVERSION N/A 02/22/2016    Procedure: TRANSESOPHAGEAL ECHOCARDIOGRAM (TEE);  Surgeon: Lars Masson, MD;  Location: [facility] ENDOSCOPY;  Service: Cardiovascular;  Laterality: N/A;   TEE WITHOUT CARDIOVERSION N/A 06/27/2016   Procedure: TRANSESOPHAGEAL ECHOCARDIOGRAM (TEE);  Surgeon: Purcell Nails, MD;  Location: [facility] OR;  Service: Open Heart Surgery;  Laterality: N/A;   TONSILLECTOMY     VIDEO ASSISTED THORACOSCOPY (VATS)/WEDGE RESECTION Left 12/30/2014   Bronchoscopy, Mediastinoscopy, Left VATS for Wedge resection LUL x2 adn LLL x1 - Dr. Ewing Schlein at [facility]   VOCAL CORD INJECTION Left 2017   injected with botox   wrist surgery Left 2015   plate to wrist     Social History   Socioeconomic History   Marital status: Divorced    Spouse name: Not on file   Number of children: Not on file   Years of education: 16   Highest education level: Not on file  Occupational History   Occupation: Retired  Tobacco Use   Smoking status: Some Days    Current packs/day: 1.00    Average packs/day: 1 pack/day for 39.7 years (39.7 ttl pk-yrs)    Types: Cigarettes    Start date: 05/21/1983    Passive exposure: Never   Smokeless tobacco: Never   Tobacco comments:    Currently smoking 5 cigs a day as of 01/13/23 Tay  Vaping Use   Vaping status: Never Used  Substance and Sexual Activity   Alcohol use: Yes    Alcohol/week: 2.0 standard drinks of alcohol    Types: 1 Shots of liquor, 1 Standard drinks or equivalent per week    Comment: occ   Drug use: No   Sexual activity: Not Currently    Partners: Male    Birth control/protection: Post-menopausal  Other Topics Concern   Not on file  Social History Narrative   Not on file   Social Determinants of Health   Financial Resource Strain: Low Risk  (09/04/2022)   Overall Financial Resource Strain (CARDIA)    Difficulty of Paying Living Expenses: Not hard at all  Food Insecurity: No Food Insecurity (09/04/2022)   Hunger Vital Sign    Worried About Running Out of Food in the Last Year:  Never true    Ran Out of Food in the Last Year: Never true  Transportation Needs: No Transportation Needs (09/04/2022)   PRAPARE - Administrator, Civil Service (Medical): No    Lack of Transportation (Non-Medical): No  Physical Activity: Insufficiently Active (09/04/2022)   Exercise Vital Sign    Days of Exercise per Week: 4 days    Minutes of Exercise per Session: 30 min  Stress: No Stress Concern Present (09/04/2022)   [employer] of Occupational Health - Occupational Stress Questionnaire    Feeling of Stress :  Not at all  Social Connections: Moderately Integrated (09/04/2022)   Social Connection and Isolation Panel [NHANES]    Frequency of Communication with Friends and Family: More than three times a week    Frequency of Social Gatherings with Friends and Family: Twice a week    Attends Religious Services: More than 4 times per year    Active Member of [employer] or Organizations: Yes    Attends Engineer, structural: More than 4 times per year    Marital Status: Divorced  Catering manager Violence: Not on file     Allergies  Allergen Reactions   Penicillins Other (See Comments) and Hives    Unknown, occurred as a child  Unknown, occurred as a child, Has patient had a PCN reaction causing immediate rash, facial/tongue/throat swelling, SOB or lightheadedness with hypotension: Yes, Has patient had a PCN reaction causing severe rash involving mucus membranes or skin necrosis: No, Has patient had a PCN reaction that required hospitalization , Has patient had a PCN reaction occurring within the last 10 years: No, If all of the above answers are "NO", then may proceed with Cephalosporin use., No   Bee Venom Swelling    Severe swelling at the sting site.     Outpatient Medications Prior to Visit  Medication Sig Dispense Refill   acetaminophen (TYLENOL) 500 MG tablet Take 1,000 mg by mouth every 6 (six) hours as needed for mild pain or headache.     Cholecalciferol  (VITAMIN D) 125 MCG (5000 UT) CAPS Take 5,000 Units by mouth daily.     clopidogrel (PLAVIX) 75 MG tablet Take 1 tablet (75 mg total) by mouth daily. 90 tablet 2   EPINEPHrine (EPIPEN 2-PAK) 0.3 mg/0.3 mL IJ SOAJ injection Inject 0.3 mg into the muscle as needed for anaphylaxis. 1 each 1   ferrous sulfate 325 (65 FE) MG tablet Take 1 tablet (325 mg total) by mouth daily. 90 tablet 3   hydrALAZINE (APRESOLINE) 25 MG tablet Take 1 tablet (25 mg total) by mouth daily as needed (for systolic BP>150). 30 tablet 9   levothyroxine (SYNTHROID) 175 MCG tablet TAKE 1 TABLET BY MOUTH EVERY DAY IN THE MORNING 90 tablet 1   MELATONIN PO Take 1 tablet by mouth at bedtime as needed (sleep).     Multiple Vitamins-Minerals (MULTIVITAMIN WITH MINERALS) tablet Take 1 tablet by mouth daily.     rosuvastatin (CRESTOR) 20 MG tablet Take 1 tablet (20 mg total) by mouth every evening. 90 tablet 3   TRELEGY ELLIPTA 100-62.5-25 MCG/ACT AEPB INHALE 1 PUFF BY MOUTH EVERY DAY 60 each 5   Turmeric 500 MG CAPS Take 500 mg by mouth daily.     vitamin B-12 (CYANOCOBALAMIN) 1000 MCG tablet Take 1 tablet (1,000 mcg total) by mouth daily.     No facility-administered medications prior to visit.    Review of Systems  Constitutional:  Negative for chills, fever, malaise/fatigue and weight loss.  HENT:  Negative for hearing loss, sore throat and tinnitus.   Eyes:  Negative for blurred vision and double vision.  Respiratory:  Positive for shortness of breath. Negative for cough, hemoptysis, sputum production, wheezing and stridor.   Cardiovascular:  Negative for chest pain, palpitations, orthopnea, leg swelling and PND.  Gastrointestinal:  Negative for abdominal pain, constipation, diarrhea, heartburn, nausea and vomiting.  Genitourinary:  Negative for dysuria, hematuria and urgency.  Musculoskeletal:  Negative for joint pain and myalgias.  Skin:  Negative for itching and rash.  Neurological:  Negative  for dizziness, tingling,  weakness and headaches.  Endo/Heme/Allergies:  Negative for environmental allergies. Does not bruise/bleed easily.  Psychiatric/Behavioral:  Negative for depression. The patient is not nervous/anxious and does not have insomnia.   All other systems reviewed and are negative.    Objective:  Physical Exam Vitals reviewed.  Constitutional:      General: She is not in acute distress.    Appearance: She is well-developed.  HENT:     Head: Normocephalic and atraumatic.  Eyes:     General: No scleral icterus.    Conjunctiva/sclera: Conjunctivae normal.     Pupils: Pupils are equal, round, and reactive to light.  Neck:     Vascular: No JVD.     Trachea: No tracheal deviation.  Cardiovascular:     Rate and Rhythm: Normal rate and regular rhythm.     Heart sounds: Normal heart sounds. No murmur heard. Pulmonary:     Effort: Pulmonary effort is normal. No tachypnea, accessory muscle usage or respiratory distress.     Breath sounds: No stridor. No wheezing, rhonchi or rales.     Comments: Bilateral inspiratory crackles Abdominal:     General: There is no distension.     Palpations: Abdomen is soft.     Tenderness: There is no abdominal tenderness.  Musculoskeletal:        General: No tenderness.     Cervical back: Neck supple.  Lymphadenopathy:     Cervical: No cervical adenopathy.  Skin:    General: Skin is warm and dry.     Capillary Refill: Capillary refill takes less than 2 seconds.     Findings: No rash.  Neurological:     Mental Status: She is alert and oriented to person, place, and time.  Psychiatric:        Behavior: Behavior normal.      Vitals:   01/13/23 1424  BP: 102/60  Pulse: 71  SpO2: 98%  Weight: 130 lb 9.6 oz (59.2 kg)  Height: 5' 6.5" (1.689 m)   98% on RA BMI Readings from Last 3 Encounters:  01/13/23 20.76 kg/m  01/09/23 20.60 kg/m  12/20/22 20.67 kg/m   Wt Readings from Last 3 Encounters:  01/13/23 130 lb 9.6 oz (59.2 kg)  01/09/23 129 lb  9.6 oz (58.8 kg)  12/20/22 130 lb (59 kg)     CBC    Component Value Date/Time   WBC 8.4 12/23/2022 1101   WBC 9.5 03/18/2022 1613   RBC 4.70 12/23/2022 1101   RBC 4.33 03/18/2022 1613   HGB 12.9 12/23/2022 1101   HCT 40.4 12/23/2022 1101   PLT 216 12/23/2022 1101   MCV 86 12/23/2022 1101   MCH 27.4 12/23/2022 1101   MCH 27.9 04/07/2021 1345   MCHC 31.9 12/23/2022 1101   MCHC 33.4 03/18/2022 1613   RDW 11.7 12/23/2022 1101   LYMPHSABS 1.2 12/23/2022 1101   MONOABS 0.4 03/18/2022 1613   EOSABS 0.0 12/23/2022 1101   BASOSABS 0.0 12/23/2022 1101     Chest Imaging:  01/07/2023 nuclear medicine PET imaging: Left lower lobe consolidated lesion that has low-level hypermetabolic uptake which slowly been increasing in size over the past several years.  Reviewed several serial CT imaging today in the office. The patient's images have been independently reviewed by me.    Pulmonary Functions Testing Results:    Latest Ref Rng & Units 11/19/2022    1:11 PM 03/18/2022    1:47 PM 02/15/2021    2:49 PM 08/05/2019  10:55 AM 03/12/2016   10:26 AM 11/14/2014   11:05 AM  PFT Results  FVC-Pre L 2.33  2.48  2.35  2.52  2.67  2.97   FVC-Predicted Pre % 72  77  71  74  76  84   FVC-Post L   2.41  2.52  2.61  3.00   FVC-Predicted Post %   72  74  75  85   Pre FEV1/FVC % % 78  81  78  81  78  78   Post FEV1/FCV % %   81  82  80  78   FEV1-Pre L 1.82  2.00  1.84  2.04  2.09  2.33   FEV1-Predicted Pre % 75  82  73  79  78  86   FEV1-Post L   1.96  2.06  2.09  2.33   DLCO uncorrected ml/min/mmHg 11.03  13.70  12.17  13.77  13.52  14.61   DLCO UNC% % 53  66  57  64  48  52   DLCO corrected ml/min/mmHg 11.03  13.70  12.17  13.77  14.73    DLCO COR %Predicted % 53  66  57  64  53    DLVA Predicted % 82  83  86  87  84  65   TLC L   3.83  4.22  4.18  4.88   TLC % Predicted %   70  77  77  89   RV % Predicted %   64  74  71  88     FeNO:   Pathology:   Echocardiogram:   Heart  Catheterization:     Assessment & Plan:     ICD-10-CM   1. Lung nodule  R91.1 Ambulatory referral to Cardiothoracic Surgery    CT Super D Chest Wo Contrast    2. History of lung cancer  Z85.118     3. History of Raynaud's syndrome  Z86.79     4. CREST (calcinosis, Raynaud's phenomenon, esophageal dysfunction, sclerodactyly, telangiectasia) (HCC)  M34.1       Discussion:  This is a 72 year old female, complex medical history to include lung cancer Raynaud's, crest syndrome had prior to wedge resections with adenocarcinoma and squamous cell carcinoma.  She has a longstanding history of tobacco use has this lower lobe lesion that is slowly been getting more solid in appearance for the past several years.  Plan: I am unsure what to do with this lower lobe lesion. It could very well represent a malignancy. She is high risk for a procedure in general but also undergoing radiation treatments with her history of interstitial lung disease and scleroderma is also not ideal either. I am going to send her over to talk with one of our thoracic surgeons for the consideration of a repeat wedge resection because it is likely that we are dealing with a separate malignancy.  I guess recurrence could be an option as well. We could also consider bronchoscopy and biopsy of the lesion but if the treatment is ultimately going to be resection in this area is nonfunctional lung anyways maybe we could bypass biopsy and move directly to removal. Will discuss this with our thoracic surgery team. We appreciate the multidisciplinary approach.   Current Outpatient Medications:    acetaminophen (TYLENOL) 500 MG tablet, Take 1,000 mg by mouth every 6 (six) hours as needed for mild pain or headache., Disp: , Rfl:    buPROPion (WELLBUTRIN SR) 150  MG 12 hr tablet, Take 1 tablet (150 mg total) by mouth 2 (two) times daily., Disp: 60 tablet, Rfl: 5   Cholecalciferol (VITAMIN D) 125 MCG (5000 UT) CAPS, Take 5,000 Units by  mouth daily., Disp: , Rfl:    clopidogrel (PLAVIX) 75 MG tablet, Take 1 tablet (75 mg total) by mouth daily., Disp: 90 tablet, Rfl: 2   EPINEPHrine (EPIPEN 2-PAK) 0.3 mg/0.3 mL IJ SOAJ injection, Inject 0.3 mg into the muscle as needed for anaphylaxis., Disp: 1 each, Rfl: 1   ferrous sulfate 325 (65 FE) MG tablet, Take 1 tablet (325 mg total) by mouth daily., Disp: 90 tablet, Rfl: 3   hydrALAZINE (APRESOLINE) 25 MG tablet, Take 1 tablet (25 mg total) by mouth daily as needed (for systolic BP>150)., Disp: 30 tablet, Rfl: 9   levothyroxine (SYNTHROID) 175 MCG tablet, TAKE 1 TABLET BY MOUTH EVERY DAY IN THE MORNING, Disp: 90 tablet, Rfl: 1   MELATONIN PO, Take 1 tablet by mouth at bedtime as needed (sleep)., Disp: , Rfl:    Multiple Vitamins-Minerals (MULTIVITAMIN WITH MINERALS) tablet, Take 1 tablet by mouth daily., Disp: , Rfl:    rosuvastatin (CRESTOR) 20 MG tablet, Take 1 tablet (20 mg total) by mouth every evening., Disp: 90 tablet, Rfl: 3   TRELEGY ELLIPTA 100-62.5-25 MCG/ACT AEPB, INHALE 1 PUFF BY MOUTH EVERY DAY, Disp: 60 each, Rfl: 5   Turmeric 500 MG CAPS, Take 500 mg by mouth daily., Disp: , Rfl:    vitamin B-12 (CYANOCOBALAMIN) 1000 MCG tablet, Take 1 tablet (1,000 mcg total) by mouth daily., Disp: , Rfl:     Josephine Igo, DO [facility] 01/13/2023 3:22 PM

## 2023-01-13 NOTE — Patient Instructions (Addendum)
Thank you for visiting Dr. Tonia Brooms at [facility]. Today we recommend the following:  Orders Placed This Encounter  Procedures   CT Super D Chest Wo Contrast   Ambulatory referral to Cardiothoracic Surgery   Return After consult with [facility].  At minimum we will get a repeat ct chest in 6 months     Please do your part to reduce the spread of COVID-19.

## 2023-01-16 NOTE — Telephone Encounter (Signed)
Spoke with patient.  She was seen and PET scan explained.

## 2023-01-27 ENCOUNTER — Institutional Professional Consult (permissible substitution) (INDEPENDENT_AMBULATORY_CARE_PROVIDER_SITE_OTHER): Payer: Medicare Other | Admitting: Thoracic Surgery (Cardiothoracic Vascular Surgery)

## 2023-01-27 VITALS — BP 97/63 | HR 74 | Resp 20 | Ht 66.5 in | Wt 130.0 lb

## 2023-01-27 DIAGNOSIS — R911 Solitary pulmonary nodule: Secondary | ICD-10-CM | POA: Diagnosis not present

## 2023-01-27 NOTE — Progress Notes (Signed)
PCP is Melida Quitter, PA Referring Provider is Josephine Igo, DO  Chief Complaint  Patient presents with   Lung Lesion    Surgical consult, Chest CT 12/13/22/ PET Scan 01/07/23/ PFT's 11/19/22    HPI: Mrs. Vasudevan is for consultation regarding a soft tissue density in her left lower lobe.  Mairin Brower is a 72 year old woman with a complex medical history.  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  I saw her in 2016 for left upper lobe lung nodules.  She went to [facility] and had left VATS and upper and lower lobe wedge resections.  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  She was followed by them until about 2021.  She was developing some soft tissue density around the staple line in the left lower lobe.  More recently she has been followed by Dr. Marchelle Gearing for ILD.  CT showed continued increase in soft tissue density.  She was referred to Dr. Tonia Brooms.  PET/CT showed mild uptake in that area.  She says her breathing is "pretty good" currently.  She does have a chronic cough.  Still smokes occasionally.  She does get short of breath with exertion.  That has been stable.  Zubrod Score: At the time of surgery this patient's most appropriate activity status/level should be described as: []     0    Normal activity, no symptoms []     1    Restricted in physical strenuous activity but ambulatory, able to do out light work [x]     2    Ambulatory and capable of self care, unable to do work activities, up and about >50 % of waking hours                              []     3    Only limited self care, in bed greater than 50% of waking hours []     4    Completely disabled, no self care,  confined to bed or chair []     5    Moribund   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 [facility] (had to have botox injection)   COPD (chronic obstructive pulmonary disease) (HCC)    Family history of adverse reaction to anesthesia    Mother- very sensitive to medication   Heart murmur    MVP   Hypothyroidism    Lung cancer (HCC) 12/30/2014   Synchronous primaries:  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, each treated by wedge resection with post-op adjuvant chemoRx at [facility]   MVP (mitral valve prolapse) 2018   s/p Minimally-Invasive Mitral Valve Replacement w/  [facility] Mitral bovine bioprosthetic tissue valve (size 31mm, model # 7300TFX, serial # [id number])   PONV (postoperative nausea and vomiting)    Raynaud's syndrome    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 06/27/2016   31 mm [facility] mitral bovine bioprosthetic tissue valve placed via right mini thoracotomy approach   Severe mitral regurgitation 11/15/2014   Shortness of breath dyspnea    with exertion   STD (sexually transmitted disease)    STEMI (ST elevation myocardial infarction) (HCC) 10/24/2020   Telangiectasia     Past Surgical History:  Procedure Laterality Date   BACK SURGERY     x 3  Disectomy   BREAST BIOPSY Left    CARDIAC CATHETERIZATION N/A 03/08/2016   Procedure: Right/Left Heart Cath and Coronary Angiography;  Surgeon: Tonny Bollman, MD;  Location[facility] INVASIVE CV LAB;  Service: Cardiovascular;  Laterality: N/A;   CLAVICLE SURGERY Left 2013   plate to left collar bone   ENDARTERECTOMY Right 08/09/2020   Procedure: RIGHT CAROTID ENDARTERECTOMY;  Surgeon: Maeola Harman,  MD;  Location: [facility] OR;  Service: Vascular;  Laterality: Right;   LAPAROSCOPY     ? reason-age 19    LEFT HEART CATH AND CORONARY ANGIOGRAPHY N/A 02/25/2019   Procedure: LEFT HEART CATH AND CORONARY ANGIOGRAPHY;  Surgeon: Corky Crafts, MD;  Location: [facility] INVASIVE CV LAB;  Service: Cardiovascular;  Laterality: N/A;   LEFT HEART CATH AND CORONARY ANGIOGRAPHY N/A 10/24/2020   Procedure: LEFT HEART CATH AND CORONARY ANGIOGRAPHY;  Surgeon: Corky Crafts, MD;  Location: [facility] INVASIVE CV LAB;  Service: Cardiovascular;  Laterality: N/A;   LUNG CANCER SURGERY     MITRAL VALVE REPAIR Right 06/27/2016   Procedure: MINIMALLY INVASIVE MITRAL VALVE REPLACEMENT;  Surgeon: Purcell Nails, MD;  Location: [facility] OR;  Service: Open Heart Surgery;  Laterality: Right;   TEE WITHOUT CARDIOVERSION N/A 02/22/2016   Procedure: TRANSESOPHAGEAL ECHOCARDIOGRAM (TEE);  Surgeon: Lars Masson, MD;  Location: [facility] ENDOSCOPY;  Service: Cardiovascular;  Laterality: N/A;   TEE WITHOUT CARDIOVERSION N/A 06/27/2016   Procedure: TRANSESOPHAGEAL ECHOCARDIOGRAM (TEE);  Surgeon: Purcell Nails, MD;  Location: [facility] OR;  Service: Open Heart Surgery;  Laterality: N/A;   TONSILLECTOMY     VIDEO ASSISTED THORACOSCOPY (VATS)/WEDGE RESECTION Left 12/30/2014   Bronchoscopy, Mediastinoscopy, Left VATS for Wedge resection LUL x2 adn LLL x1 - Dr. Ewing Schlein at [facility]   VOCAL CORD INJECTION Left 2017   injected with botox   wrist surgery Left 2015   plate to wrist     Family History  Problem Relation Age of Onset   Mitral valve prolapse Mother    Dementia Father    Prostate cancer Father    Mitral valve prolapse Sister    Mitral valve prolapse Brother    Healthy Son    Healthy Daughter    Colon cancer Neg Hx    Colon polyps Neg Hx    Rectal cancer Neg Hx    Esophageal cancer Neg Hx    Stomach cancer Neg Hx    Heart attack Neg Hx     Social History Social History   Tobacco Use   Smoking status: Some Days    Current packs/day:  1.00    Average packs/day: 1 pack/day for 39.7 years (39.7 ttl pk-yrs)    Types: Cigarettes    Start date: 05/21/1983    Passive exposure: Never   Smokeless tobacco: Never   Tobacco comments:    Currently smoking 5 cigs a day as of 01/13/23 Tay  Vaping Use   Vaping status: Never Used  Substance Use Topics   Alcohol use: Yes    Alcohol/week: 2.0 standard drinks  of alcohol    Types: 1 Shots of liquor, 1 Standard drinks or equivalent per week    Comment: occ   Drug use: No    Current Outpatient Medications  Medication Sig Dispense Refill   acetaminophen (TYLENOL) 500 MG tablet Take 1,000 mg by mouth every 6 (six) hours as needed for mild pain or headache.     buPROPion (WELLBUTRIN SR) 150 MG 12 hr tablet Take 1 tablet (150 mg total) by mouth 2 (two) times daily. 60 tablet 5   Cholecalciferol (VITAMIN D) 125 MCG (5000 UT) CAPS Take 5,000 Units by mouth daily.     clopidogrel (PLAVIX) 75 MG tablet Take 1 tablet (75 mg total) by mouth daily. 90 tablet 2   EPINEPHrine (EPIPEN 2-PAK) 0.3 mg/0.3 mL IJ SOAJ injection Inject 0.3 mg into the muscle as needed for anaphylaxis. 1 each 1   ferrous sulfate 325 (65 FE) MG tablet Take 1 tablet (325 mg total) by mouth daily. 90 tablet 3   hydrALAZINE (APRESOLINE) 25 MG tablet Take 1 tablet (25 mg total) by mouth daily as needed (for systolic BP>150). 30 tablet 9   levothyroxine (SYNTHROID) 175 MCG tablet TAKE 1 TABLET BY MOUTH EVERY DAY IN THE MORNING 90 tablet 1   MELATONIN PO Take 1 tablet by mouth at bedtime as needed (sleep).     Multiple Vitamins-Minerals (MULTIVITAMIN WITH MINERALS) tablet Take 1 tablet by mouth daily.     rosuvastatin (CRESTOR) 20 MG tablet Take 1 tablet (20 mg total) by mouth every evening. 90 tablet 3   TRELEGY ELLIPTA 100-62.5-25 MCG/ACT AEPB INHALE 1 PUFF BY MOUTH EVERY DAY 60 each 5   Turmeric 500 MG CAPS Take 500 mg by mouth daily.     vitamin B-12 (CYANOCOBALAMIN) 1000 MCG tablet Take 1 tablet (1,000 mcg total) by mouth  daily.     No current facility-administered medications for this visit.    Allergies  Allergen Reactions   Penicillins Other (See Comments) and Hives    Unknown, occurred as a child  Unknown, occurred as a child, Has patient had a PCN reaction causing immediate rash, facial/tongue/throat swelling, SOB or lightheadedness with hypotension: Yes, Has patient had a PCN reaction causing severe rash involving mucus membranes or skin necrosis: No, Has patient had a PCN reaction that required hospitalization , Has patient had a PCN reaction occurring within the last 10 years: No, If all of the above answers are "NO", then may proceed with Cephalosporin use., No   Bee Venom Swelling    Severe swelling at the sting site.    Review of Systems  Constitutional:  Positive for activity change. Negative for unexpected weight change.  HENT:  Positive for voice change.   Respiratory:  Positive for cough and shortness of breath.   Cardiovascular:  Negative for chest pain and leg swelling.  All other systems reviewed and are negative.   BP 97/63   Pulse 74   Resp 20   Ht 5' 6.5" (1.689 m)   Wt 130 lb (59 kg)   SpO2 97% Comment: RA  BMI 20.67 kg/m  Physical Exam Vitals reviewed.  Constitutional:      General: She is not in acute distress.    Appearance: Normal appearance.  HENT:     Head: Normocephalic and atraumatic.  Eyes:     General: No scleral icterus.    Extraocular Movements: Extraocular movements intact.  Cardiovascular:     Rate and Rhythm: Normal rate and regular rhythm.  Heart sounds: No murmur heard. Pulmonary:     Effort: Pulmonary effort is normal.     Comments: Crackles at bases, no wheezes Abdominal:     General: There is no distension.     Palpations: Abdomen is soft.  Lymphadenopathy:     Cervical: No cervical adenopathy.  Skin:    General: Skin is warm and dry.  Neurological:     General: No focal deficit present.     Mental Status: She is alert and oriented to  person, place, and time.     Cranial Nerves: No cranial nerve deficit.     Diagnostic Tests: NUCLEAR MEDICINE PET SKULL BASE TO THIGH   TECHNIQUE: 6.4 mCi F-18 FDG was injected intravenously. Full-ring PET imaging was performed from the skull base to thigh after the radiotracer. CT data was obtained and used for attenuation correction and anatomic localization.   Fasting blood glucose: 92 mg/dl   COMPARISON:  Multiple previous imaging studies. The most recent CT scan is 12/13/2022. The most recent PET-CT is from 2017   FINDINGS: Mediastinal blood pool activity: SUV max 1.93   Liver activity: SUV max NA   NECK: There is an elongated subcutaneous nodule along the anterior aspect of the base of the right ear anterior to the parotid gland. This measures 14 mm and is markedly hypermetabolic with SUV max of 10.34. There is a similar but smaller lesion on the left side measuring 6 mm with SUV max of 6.02. There is also irregular nodular subcutaneous tissue anterior to the maxillary sinuses bilaterally which demonstrates hypermetabolism. SUV max is 9.41. Not exclude a subcutaneous neoplastic process. Recommend correlation with physical exam, clinical findings and biopsy if possible.   No cervical lymphadenopathy.   Incidental CT findings: Bilateral carotid artery calcifications.   CHEST: Ill-defined airspace consolidation in the left lower lobe as demonstrated on the prior chest CT. This area demonstrates mild hypermetabolism with SUV max 3.46. Favor post treatment changes or inflammatory or infectious process as opposed to recurrent tumor but close follow-up is suggested.   No enlarged or hypermetabolic mediastinal or hilar lymph nodes. No hypermetabolic breast masses, supraclavicular or axillary adenopathy.   Incidental CT findings: Stable underlying severe emphysematous changes and pulmonary scarring. Stable left upper lobe surgical scarring changes.   ABDOMEN/PELVIS:  No abnormal hypermetabolic activity within the liver, pancreas, adrenal glands, or spleen. No hypermetabolic lymph nodes in the abdomen or pelvis.   Incidental CT findings: Stable vascular disease. Stable colonic diverticulosis. Stable left adnexal cyst measuring 4 cm. No hypermetabolism. No change since 2017.   SKELETON: No findings for osseous metastatic disease.   Incidental CT findings: None.   IMPRESSION: 1. Ill-defined airspace consolidation in the left lower lobe demonstrates mild hypermetabolism. Favor post treatment changes or inflammatory or infectious process but close follow-up is suggested. 2. No mediastinal or hilar lymphadenopathy. 3. No findings for abdominal/pelvic metastatic disease or osseous metastatic disease. 4. Hypermetabolic subcutaneous nodules along the anterior aspect of the base of the ears bilaterally and anterior to the maxillary sinuses bilaterally. Recommend correlation with physical exam, clinical findings and biopsy if possible.     Electronically Signed   By: Rudie Meyer M.D.   On: 01/07/2023 19:11 I personally reviewed the CT and PET/CT images.  There is soft tissue density in the posterior left lower lobe around her previous staple line.  Extensive honeycombing at the base of the left lower lobe.  Mild hypermetabolism.  Impression: Maahi Bayha is a 72 year old woman with a  complex medical history.  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  She had a left VATS with left upper and lower lobe wedge resections at [facility] in 2016.  The left upper lobe nodule was a T2a, N0, 4.9 cm squamous cell carcinoma.  The lower lobe nodule was a T1, N0, 2.8 cm adenocarcinoma.  She  was treated with postoperative chemoradiation.  She has had a slowly enlarging soft tissue density around the staple line in her posterior left lower lobe.  This has been going on for years dating back even to her last notes at [facility] in 2021.  More recently she had a PET/CT which showed some mild uptake in that area.  Differential diagnosis is recurrent cancer versus postradiation change versus a chronic organizing pneumonia.  Unfortunately this is in a central location and is not amenable to repeat wedge resection particularly the redo setting and would require a lobectomy for resection.  I do not think she is a candidate for lobectomy even though her pulmonary function testing is not prohibitive, she has numerous comorbidities that make her a poor candidate for surgery.  Options include continued radiographic follow-up versus navigational bronchoscopy versus CT-guided biopsy.   Plan: Follow-up with Dr. Tonia Brooms  I spent 40 minutes in review of records, images, and in consultation with Mrs. Gradilla today. Loreli Slot, MD [facility] [phone]

## 2023-01-28 DIAGNOSIS — Z23 Encounter for immunization: Secondary | ICD-10-CM | POA: Diagnosis not present

## 2023-02-04 ENCOUNTER — Encounter: Payer: Self-pay | Admitting: Internal Medicine

## 2023-02-07 DIAGNOSIS — N83202 Unspecified ovarian cyst, left side: Secondary | ICD-10-CM | POA: Diagnosis not present

## 2023-02-08 ENCOUNTER — Other Ambulatory Visit: Payer: Self-pay | Admitting: Pulmonary Disease

## 2023-02-11 ENCOUNTER — Other Ambulatory Visit: Payer: Self-pay

## 2023-02-11 MED ORDER — FERROUS SULFATE 325 (65 FE) MG PO TABS: 325.0000 mg | ORAL_TABLET | Freq: Every day | ORAL | 3 refills | Status: AC

## 2023-02-18 ENCOUNTER — Ambulatory Visit: Payer: Medicare Other

## 2023-02-18 DIAGNOSIS — F17210 Nicotine dependence, cigarettes, uncomplicated: Secondary | ICD-10-CM

## 2023-02-18 DIAGNOSIS — J849 Interstitial pulmonary disease, unspecified: Secondary | ICD-10-CM

## 2023-02-18 DIAGNOSIS — N83202 Unspecified ovarian cyst, left side: Secondary | ICD-10-CM | POA: Diagnosis not present

## 2023-02-18 DIAGNOSIS — R0609 Other forms of dyspnea: Secondary | ICD-10-CM

## 2023-02-18 LAB — PULMONARY FUNCTION TEST
DL/VA % pred: 81 %
DL/VA: 3.32 ml/min/mmHg/L
DLCO cor % pred: 57 %
DLCO cor: 11.95 ml/min/mmHg
DLCO unc % pred: 56 %
DLCO unc: 11.76 ml/min/mmHg
FEF 25-75 Pre: 1.63 L/s
FEF2575-%Pred-Pre: 85 %
FEV1-%Pred-Pre: 75 %
FEV1-Pre: 1.81 L
FEV1FVC-%Pred-Pre: 105 %
FEV6-%Pred-Pre: 74 %
FEV6-Pre: 2.26 L
FEV6FVC-%Pred-Pre: 104 %
FVC-%Pred-Pre: 71 %
FVC-Pre: 2.26 L
Pre FEV1/FVC ratio: 80 %
Pre FEV6/FVC Ratio: 100 %

## 2023-02-18 NOTE — Progress Notes (Signed)
Spirometry/DLCO performed today. 

## 2023-02-18 NOTE — Patient Instructions (Signed)
Spirometry/DLCO performed today. 

## 2023-02-26 ENCOUNTER — Other Ambulatory Visit: Payer: Medicare Other

## 2023-02-26 ENCOUNTER — Other Ambulatory Visit: Payer: Self-pay

## 2023-02-26 DIAGNOSIS — E039 Hypothyroidism, unspecified: Secondary | ICD-10-CM

## 2023-03-03 DIAGNOSIS — E039 Hypothyroidism, unspecified: Secondary | ICD-10-CM | POA: Diagnosis not present

## 2023-03-04 ENCOUNTER — Encounter: Payer: Self-pay | Admitting: Internal Medicine

## 2023-03-04 ENCOUNTER — Other Ambulatory Visit: Payer: Self-pay | Admitting: Family Medicine

## 2023-03-04 ENCOUNTER — Ambulatory Visit (INDEPENDENT_AMBULATORY_CARE_PROVIDER_SITE_OTHER): Payer: Medicare Other | Admitting: Internal Medicine

## 2023-03-04 VITALS — BP 120/62 | HR 74 | Temp 98.3°F | Ht 66.5 in | Wt 128.6 lb

## 2023-03-04 DIAGNOSIS — F17210 Nicotine dependence, cigarettes, uncomplicated: Secondary | ICD-10-CM

## 2023-03-04 DIAGNOSIS — Z720 Tobacco use: Secondary | ICD-10-CM | POA: Diagnosis not present

## 2023-03-04 DIAGNOSIS — J849 Interstitial pulmonary disease, unspecified: Secondary | ICD-10-CM | POA: Diagnosis not present

## 2023-03-04 DIAGNOSIS — R768 Other specified abnormal immunological findings in serum: Secondary | ICD-10-CM | POA: Diagnosis not present

## 2023-03-04 DIAGNOSIS — Z85118 Personal history of other malignant neoplasm of bronchus and lung: Secondary | ICD-10-CM

## 2023-03-04 DIAGNOSIS — E039 Hypothyroidism, unspecified: Secondary | ICD-10-CM

## 2023-03-04 DIAGNOSIS — Z8679 Personal history of other diseases of the circulatory system: Secondary | ICD-10-CM | POA: Diagnosis not present

## 2023-03-04 DIAGNOSIS — R911 Solitary pulmonary nodule: Secondary | ICD-10-CM

## 2023-03-04 LAB — TSH: TSH: 0.094 u[IU]/mL — ABNORMAL LOW (ref 0.450–4.500)

## 2023-03-04 LAB — T4, FREE: Free T4: 2.51 ng/dL — ABNORMAL HIGH (ref 0.82–1.77)

## 2023-03-04 MED ORDER — BUPROPION HCL ER (SR) 150 MG PO TB12: ORAL_TABLET | ORAL | 0 refills | Status: DC

## 2023-03-04 MED ORDER — LEVOTHYROXINE SODIUM 150 MCG PO TABS
150.0000 ug | ORAL_TABLET | Freq: Every day | ORAL | 3 refills | Status: DC
Start: 2023-03-04 — End: 2024-03-15

## 2023-03-04 NOTE — Patient Instructions (Addendum)
ICD-10-CM   1. ILD (interstitial lung disease) (HCC)  J84.9     2. History of Raynaud's syndrome  Z86.79     3. ANA positive  R76.8     4. History of lung cancer  Z85.118     5. Nodule of lower lobe of left lung  R91.1     6. Current Cigarette smoker  F17.210     7. Smoking trying to quit  Z72.0       ILD (interstitial lung disease) (HCC) History of Raynaud's syndrome ANA positive  - progressive phenotype  -currently/recently stable -Nintedanib caused significant GI side effects  Plan - supportive care without anti-fibrotics or immune modulator - Do spirometry and DLCO in February 2025   -Certainly if there is progression then we can consider other medications  -Currently focus on quitting smoking and pulmonary nodule  History of lung cancer Nodule of lower lobe of left lung  -According to Dr. Dorris Fetch there is a slow-growing nodule in the left lower lobe along the suture line from previous lung cancer surgery.  This is not amenable to local wedge resection  Plan - Refer back to [facility] where original surgery was done to get a second opinion -CT scan of the chest without contrast [high-resolution] in February 2025  -Certainly can cancel if [employer] is already doing CT scans of new  Current Cigarette smoker Smoking trying to quit  PLAN   STart wellbutrin: Initial: 150 mg once daily for 3 days; increase to 150 mg twice daily treatment which you should continue for 12 weeks  Also, 2 WEEKS after your start wellbutrin quit smoking    Followup -February 2025 seeks do spirometry and DLCO and CT scan of the chest   - symptom score and sit/stand test at follow-up  -30-minute visit  - if progression + -> consider immune modulartos +/- clinical trial

## 2023-03-04 NOTE — Progress Notes (Signed)
General Joy Sprague, MD as PCP - Cardiology (Cardiology) Joy Cowden, MD as Consulting Physician (Pulmonary Disease) Joy Drain, MD as Consulting Physician (Rheumatology) Joy Dago, MD as Referring Physician Joy Cashing, MD as Referring Physician (Internal Medicine) Joy Munson, MD as Consulting Physician (Infectious Diseases) [name], Rounding, MD as Rounding Team (Cardiology)  This Provider for this visit: Treatment Team:  Attending Provider: Kalman Shan, MD    03/18/2022 -   Chief Complaint  Patient presents with   Follow-up    PFT performed today.  Pt states she has been doing okay since last visit. States she has had some congestion and also problems with coughing in the mornings.    Joy Williams 72 y.o. -returns for follow-up.  She is a mother of Joy Williams who I got a note to the [employer].  Since her last visit she is doing well overall.  She is tolerating the low-dose of nintedanib well.  She had recent liver function tests monitoring but her sodium is low at 129 on February 06, 2022.  Her baseline is always between 130-132 on the lower side but this is even further lower.  She is not on any hydrochlorothiazide.  She is not on Lasix currently.  She continues to smoke.  Her symptoms are stable although she is reporting worsening cough there is no wheezing the Trelegy does help but despite this during the night and early morning she is having worsening cough.  She did have a CT scan of the chest that shows progression over time including  from last year.  Her most recent pulmonary function test is slightly improved from a year ago but FVC is slightly down compared to a year and a half 2 years ago.  According to radiology ILD is progressive even compared to a year ago.  Definitely more progressive over time.  Overall it appears the nintedanib might be helping her.   In terms of her smoking we discussed this.  She is interested in quitting.  She has never tried Chantix in the past.  We did discuss dreams and other side effects from it.  She denies any suicidal ideations.  We discussed how to quit smoking with the help of Chantix.  We at least went 3 minutes quitting smoking   Her lung cancer is in remission.   Based on CT chest September 2023    CT Chest data - HRCT Sept 2023  IMPRESSION: 1. Moderate pulmonary fibrosis in a pattern with apical to basal gradient, featuring irregular peripheral interstitial opacity, septal thickening, traction bronchiectasis, and subpleural bronchiolectasis at the lung bases, now with some evidence of honeycombing, slightly worsened compared to prior examination dated 10/18/2020 and more clearly worsened over a longer period of time dating back to 2016. Findings are consistent with UIP per consensus guidelines: Diagnosis of Idiopathic Pulmonary Fibrosis: An Official ATS/ERS/JRS/ALAT Clinical Practice Guideline. Joy Williams Vol 198, Iss 5, [phone], Jan 18 2017. 2. Status post anterior left upper lobe and posterior left lower lobe wedge resections. No evidence of malignant recurrence in the chest. 3. Coronary artery disease.   Aortic Atherosclerosis (ICD10-I70.0).     Electronically Signed   By: Joy Williams M.D.   On: 01/20/2022 21:21   OV 11/19/2022  Subjective:  Patient ID: Joy Williams, female , DOB: [birth year] , age 75 y.o. , MRN: [id number] , ADDRESS: [address] [place] [id number] PCP Joy Quitter, PA Patient Care  performed today. Pt states she has been doing okay since last visit and states she is about the same.    Joy Joy Williams 72 y.o. -presents for follow-up.  Last seen in May 2022.  At the time initiated ILD work-up.  Her ANA is strongly positive.  She has history of Raynaud's.  So we sent her to rheumatology.  She saw Dr Joy Williams only recently.  First note reviewed.  No specific connective tissue disease identified. AVISE was sent out and they texted me today saying the results were negative.  Patient continues to overall feel the same.  She had pulmonary function test that shows decline over time.  Approximately 6 %/year over the last 18  months.  This is when FVC and DLCO are averaged.  She is definitely not feeling the difference.  Of note she had a Takotsubo cardiomyopathy in June 2022.  By July 2022 her echo normalized.  She will consult cardiac rehabilitation soon.  She continues to smoke but has reduced.  She nondistended importance of quitting smoking.  She will have a high-dose flu shot today.  We discussed COVID booster by Valent.       IMPRESSION: 1. There is mild pulmonary fibrosis in a pattern with apical to basal gradient, featuring irregular peripheral interstitial opacity, septal thickening, traction bronchiectasis, and subpleural bronchiolectasis at the lung basis without clear evidence of honeycombing. Fibrotic findings are worsened over time on prior examinations dating back to 11/16/2014. Findings are categorized as probable UIP per consensus guidelines: Diagnosis of Idiopathic Pulmonary Fibrosis: An Official ATS/ERS/JRS/ALAT Clinical Practice Guideline. Joy Williams Vol 198, Iss 5, [phone], Jan 18 2017. 2. Redemonstrated postoperative findings of anterior left upper lobe and dependent left lower lobe wedge resections. No evidence of malignant recurrence in the chest. 3. Coronary artery disease.   Aortic Atherosclerosis (ICD10-I70.0).     Electronically Signed   By: Joy Williams M.D.   OnResults for BRONWEN, PENDERGRAFT (MRN [id number]) as of 10/20/2020 16:40  Ref. Range 10/12/2020 16:37  Anti Nuclear Antibody (ANA) Latest Ref Range: NEGATIVE  POSITIVE (A)  ANA Pattern 1 Unknown Nuclear, Nucleolar (A)  ANA Titer 1 Latest Units: titer 1:1,280 (H)  Cyclic Citrullin Peptide Ab Latest Units: UNITS <16  ds DNA Ab Latest Units: IU/mL <1  Myeloperoxidase Abs Latest Units: AI <1.0  Serine Protease 3 Latest Units: AI <1.0  RA Latex Turbid. Latest Ref Range: <14 IU/mL <14  SSA (Ro) (ENA) Antibody, IgG Latest Ref Range: <1.0 NEG AI <1.0 NEG  SSB (La) (ENA) Antibody, IgG Latest Ref Range: <1.0 NEG  AI <1.0 NEG  Scleroderma (Scl-70) (ENA) Antibody, IgG Latest Ref Range: <1.0 NEG AI <1.0 NEG      OV 03/26/2021  Subjective:  Patient ID: Joy Williams, female , DOB: [birth year] , age 51 y.o. , MRN: [id number] , ADDRESS: [address] [place] [phone] PCP Mayer Masker, PA-C Patient Care Team: Mayer Masker, PA-C as PCP - General Joy Sprague, MD as PCP - Cardiology (Cardiology) Joy Cowden, MD as Consulting Physician (Pulmonary Disease) Joy Drain, MD as Consulting Physician (Rheumatology) Joy Dago, MD as Referring Physician Joy Cashing, MD as Referring Physician (Internal Medicine) Joy Munson, MD as Consulting Physician (Infectious Diseases) [name], Rounding, MD as Rounding Team (Cardiology)  This Provider for this visit: Treatment Team:  Attending Provider: Kalman Shan, MD   03/26/2021 -  non-IPF ILD (IPAF) progressive phenotype.    Joy Joy Williams 72 y.o. -  performed today. Pt states she has been doing okay since last visit and states she is about the same.    Joy Joy Williams 72 y.o. -presents for follow-up.  Last seen in May 2022.  At the time initiated ILD work-up.  Her ANA is strongly positive.  She has history of Raynaud's.  So we sent her to rheumatology.  She saw Dr Joy Williams only recently.  First note reviewed.  No specific connective tissue disease identified. AVISE was sent out and they texted me today saying the results were negative.  Patient continues to overall feel the same.  She had pulmonary function test that shows decline over time.  Approximately 6 %/year over the last 18  months.  This is when FVC and DLCO are averaged.  She is definitely not feeling the difference.  Of note she had a Takotsubo cardiomyopathy in June 2022.  By July 2022 her echo normalized.  She will consult cardiac rehabilitation soon.  She continues to smoke but has reduced.  She nondistended importance of quitting smoking.  She will have a high-dose flu shot today.  We discussed COVID booster by Valent.       IMPRESSION: 1. There is mild pulmonary fibrosis in a pattern with apical to basal gradient, featuring irregular peripheral interstitial opacity, septal thickening, traction bronchiectasis, and subpleural bronchiolectasis at the lung basis without clear evidence of honeycombing. Fibrotic findings are worsened over time on prior examinations dating back to 11/16/2014. Findings are categorized as probable UIP per consensus guidelines: Diagnosis of Idiopathic Pulmonary Fibrosis: An Official ATS/ERS/JRS/ALAT Clinical Practice Guideline. Joy Williams Vol 198, Iss 5, [phone], Jan 18 2017. 2. Redemonstrated postoperative findings of anterior left upper lobe and dependent left lower lobe wedge resections. No evidence of malignant recurrence in the chest. 3. Coronary artery disease.   Aortic Atherosclerosis (ICD10-I70.0).     Electronically Signed   By: Joy Williams M.D.   OnResults for BRONWEN, PENDERGRAFT (MRN [id number]) as of 10/20/2020 16:40  Ref. Range 10/12/2020 16:37  Anti Nuclear Antibody (ANA) Latest Ref Range: NEGATIVE  POSITIVE (A)  ANA Pattern 1 Unknown Nuclear, Nucleolar (A)  ANA Titer 1 Latest Units: titer 1:1,280 (H)  Cyclic Citrullin Peptide Ab Latest Units: UNITS <16  ds DNA Ab Latest Units: IU/mL <1  Myeloperoxidase Abs Latest Units: AI <1.0  Serine Protease 3 Latest Units: AI <1.0  RA Latex Turbid. Latest Ref Range: <14 IU/mL <14  SSA (Ro) (ENA) Antibody, IgG Latest Ref Range: <1.0 NEG AI <1.0 NEG  SSB (La) (ENA) Antibody, IgG Latest Ref Range: <1.0 NEG  AI <1.0 NEG  Scleroderma (Scl-70) (ENA) Antibody, IgG Latest Ref Range: <1.0 NEG AI <1.0 NEG      OV 03/26/2021  Subjective:  Patient ID: Joy Williams, female , DOB: [birth year] , age 51 y.o. , MRN: [id number] , ADDRESS: [address] [place] [phone] PCP Mayer Masker, PA-C Patient Care Team: Mayer Masker, PA-C as PCP - General Joy Sprague, MD as PCP - Cardiology (Cardiology) Joy Cowden, MD as Consulting Physician (Pulmonary Disease) Joy Drain, MD as Consulting Physician (Rheumatology) Joy Dago, MD as Referring Physician Joy Cashing, MD as Referring Physician (Internal Medicine) Joy Munson, MD as Consulting Physician (Infectious Diseases) [name], Rounding, MD as Rounding Team (Cardiology)  This Provider for this visit: Treatment Team:  Attending Provider: Kalman Shan, MD   03/26/2021 -  non-IPF ILD (IPAF) progressive phenotype.    Joy Joy Williams 72 y.o. -  OV 10/12/2020  Subjective:  Patient ID: Joy Williams, female , DOB: 05/29/1950 , age 28 y.o. , MRN: [id number] , ADDRESS: [address] [place] [phone] PCP Mayer Masker, PA-C Patient Care Team: Mayer Masker, PA-C as PCP - General Joy Sprague, MD as PCP - Cardiology (Cardiology) Lars Masson, MD (Inactive) as Consulting Physician (Cardiology) Joy Cowden, MD as Consulting Physician (Pulmonary Disease) Joy Drain, MD as Consulting Physician (Rheumatology) Joy Dago, MD as Referring Physician Joy Cashing, MD as Referring Physician (Internal Medicine) Joy Munson, MD as Consulting Physician (Infectious Diseases)  This Provider for this visit: Treatment Team:  Attending Provider: Kalman Shan, MD    10/12/2020 -   Chief Complaint  Patient presents with   Consult    Pt is being referred to Dr. Marchelle Gearing by Dr. Sherene Sires.  Pt states that she has an occ cough but denies any other complaints.   Transfer of care from Dr. Sandrea Hughs to Dr. Marchelle Gearing in the ILD center.  History is gained from talking to the patient and review of the medical records including [address] and visualization of 2016 or 2017 CT scan of the chest.  Joy Joy Williams 72 y.o. -used to live in [place].  She worked in the [employer] for [employer].  In 2016 she retired and came to [place].  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  Her primary care physician thought she might have crest syndrome because she had Raynaud's along with allergic cases and she had edema in her fingers.  After she moved here apparently she saw a rheumatologist [review of the chart indicates is Dr. Kellie Simmering who has since retired] and was told that she might not have scleroderma.  She has been on observation therapy from a rheumatological standpoint.  In fact never seen a  rheumatologist.  Then in 2016 she had early stage non-small cell lung cancer for which she underwent wedge resection left upper lobe.  She is followed up with [facility] since then.  She has had annual CT scans of the chest.  Most recent 1 was in May 2021.  She believes and per record review that she is in complete remission and I believe she might be discharged from follow-up there.  She tells me that at baseline she has very mild shortness of breath that is stable as documented below.  She also has amount of cough that is also documented below.  Is not clear that this is necessarily worse.  She is maintained on inhaled corticosteroid and long-acting beta agonist.  She is not sure if this is because of her diagnose of COPD other than the fact that she is on it for respiratory symptoms.  Most recently when she followed up with Dr. Thurston Hole review of the records revealed that there was interstitial lung disease findings on the CT scan of the chest in May 2021 at [facility].  In my review of the notes it appears that the pattern might have changed.  Infection was present even in 2016 2017 and currently the pattern might be changed.  She is definitely not feeling it.  Her symptoms are minimal and her walking desaturation test is normal.  Because of the ILD changes she has been referred to the ILD center    CT Chest [facility] 09/23/19 at [facility] (no image avail)  Interface, Rad Results In -  OV 10/12/2020  Subjective:  Patient ID: Joy [place], female , DOB: 05/29/1950 , age 28 y.o. , MRN: [id number] , ADDRESS: [address] [place] [phone] PCP Mayer Masker, PA-C Patient Care Team: Mayer Masker, PA-C as PCP - General Joy Sprague, MD as PCP - Cardiology (Cardiology) Lars Masson, MD (Inactive) as Consulting Physician (Cardiology) Joy Cowden, MD as Consulting Physician (Pulmonary Disease) Joy Drain, MD as Consulting Physician (Rheumatology) Joy Dago, MD as Referring Physician Joy Cashing, MD as Referring Physician (Internal Medicine) Joy Munson, MD as Consulting Physician (Infectious Diseases)  This Provider for this visit: Treatment Team:  Attending Provider: Kalman Shan, MD    10/12/2020 -   Chief Complaint  Patient presents with   Consult    Pt is being referred to Dr. Marchelle Gearing by Dr. Sherene Sires.  Pt states that she has an occ cough but denies any other complaints.   Transfer of care from Dr. Sandrea Hughs to Dr. Marchelle Gearing in the ILD center.  History is gained from talking to the patient and review of the medical records including [address] and visualization of 2016 or 2017 CT scan of the chest.  Joy Joy [place] 72 y.o. -used to live in [place].  She worked in the [employer] for [employer].  In 2016 she retired and came to [place].  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  Her primary care physician thought she might have crest syndrome because she had Raynaud's along with allergic cases and she had edema in her fingers.  After she moved here apparently she saw a rheumatologist [review of the chart indicates is Dr. Kellie Simmering who has since retired] and was told that she might not have scleroderma.  She has been on observation therapy from a rheumatological standpoint.  In fact never seen a  rheumatologist.  Then in 2016 she had early stage non-small cell lung cancer for which she underwent wedge resection left upper lobe.  She is followed up with [facility] since then.  She has had annual CT scans of the chest.  Most recent 1 was in May 2021.  She believes and per record review that she is in complete remission and I believe she might be discharged from follow-up there.  She tells me that at baseline she has very mild shortness of breath that is stable as documented below.  She also has amount of cough that is also documented below.  Is not clear that this is necessarily worse.  She is maintained on inhaled corticosteroid and long-acting beta agonist.  She is not sure if this is because of her diagnose of COPD other than the fact that she is on it for respiratory symptoms.  Most recently when she followed up with Dr. Thurston Hole review of the records revealed that there was interstitial lung disease findings on the CT scan of the chest in May 2021 at [facility].  In my review of the notes it appears that the pattern might have changed.  Infection was present even in 2016 2017 and currently the pattern might be changed.  She is definitely not feeling it.  Her symptoms are minimal and her walking desaturation test is normal.  Because of the ILD changes she has been referred to the ILD center    CT Chest [facility] 09/23/19 at [facility] (no image avail)  Interface, Rad Results In -  performed today. Pt states she has been doing okay since last visit and states she is about the same.    Joy Joy Williams 72 y.o. -presents for follow-up.  Last seen in May 2022.  At the time initiated ILD work-up.  Her ANA is strongly positive.  She has history of Raynaud's.  So we sent her to rheumatology.  She saw Dr Joy Williams only recently.  First note reviewed.  No specific connective tissue disease identified. AVISE was sent out and they texted me today saying the results were negative.  Patient continues to overall feel the same.  She had pulmonary function test that shows decline over time.  Approximately 6 %/year over the last 18  months.  This is when FVC and DLCO are averaged.  She is definitely not feeling the difference.  Of note she had a Takotsubo cardiomyopathy in June 2022.  By July 2022 her echo normalized.  She will consult cardiac rehabilitation soon.  She continues to smoke but has reduced.  She nondistended importance of quitting smoking.  She will have a high-dose flu shot today.  We discussed COVID booster by Valent.       IMPRESSION: 1. There is mild pulmonary fibrosis in a pattern with apical to basal gradient, featuring irregular peripheral interstitial opacity, septal thickening, traction bronchiectasis, and subpleural bronchiolectasis at the lung basis without clear evidence of honeycombing. Fibrotic findings are worsened over time on prior examinations dating back to 11/16/2014. Findings are categorized as probable UIP per consensus guidelines: Diagnosis of Idiopathic Pulmonary Fibrosis: An Official ATS/ERS/JRS/ALAT Clinical Practice Guideline. Joy Williams Vol 198, Iss 5, [phone], Jan 18 2017. 2. Redemonstrated postoperative findings of anterior left upper lobe and dependent left lower lobe wedge resections. No evidence of malignant recurrence in the chest. 3. Coronary artery disease.   Aortic Atherosclerosis (ICD10-I70.0).     Electronically Signed   By: Joy Williams M.D.   OnResults for BRONWEN, PENDERGRAFT (MRN [id number]) as of 10/20/2020 16:40  Ref. Range 10/12/2020 16:37  Anti Nuclear Antibody (ANA) Latest Ref Range: NEGATIVE  POSITIVE (A)  ANA Pattern 1 Unknown Nuclear, Nucleolar (A)  ANA Titer 1 Latest Units: titer 1:1,280 (H)  Cyclic Citrullin Peptide Ab Latest Units: UNITS <16  ds DNA Ab Latest Units: IU/mL <1  Myeloperoxidase Abs Latest Units: AI <1.0  Serine Protease 3 Latest Units: AI <1.0  RA Latex Turbid. Latest Ref Range: <14 IU/mL <14  SSA (Ro) (ENA) Antibody, IgG Latest Ref Range: <1.0 NEG AI <1.0 NEG  SSB (La) (ENA) Antibody, IgG Latest Ref Range: <1.0 NEG  AI <1.0 NEG  Scleroderma (Scl-70) (ENA) Antibody, IgG Latest Ref Range: <1.0 NEG AI <1.0 NEG      OV 03/26/2021  Subjective:  Patient ID: Joy [place], female , DOB: [birth year] , age 51 y.o. , MRN: [id number] , ADDRESS: [address] [place] [phone] PCP Mayer Masker, PA-C Patient Care Team: Mayer Masker, PA-C as PCP - General Joy Sprague, MD as PCP - Cardiology (Cardiology) Joy Cowden, MD as Consulting Physician (Pulmonary Disease) Joy Drain, MD as Consulting Physician (Rheumatology) Joy Dago, MD as Referring Physician Joy Cashing, MD as Referring Physician (Internal Medicine) Joy Munson, MD as Consulting Physician (Infectious Diseases) [name], Rounding, MD as Rounding Team (Cardiology)  This Provider for this visit: Treatment Team:  Attending Provider: Kalman Shan, MD   03/26/2021 -  non-IPF ILD (IPAF) progressive phenotype.    Joy Joy Williams 72 y.o. -  Team: Joy Quitter,  PA as PCP - General (Family Medicine) Joy Sprague, MD as PCP - Cardiology (Cardiology) Joy Cowden, MD as Consulting Physician (Pulmonary Disease) Joy Drain, MD as Consulting Physician (Rheumatology) Joy Dago, MD as Referring Physician Joy Cashing, MD as Referring Physician (Internal Medicine) Joy Munson, MD as Consulting Physician (Infectious Diseases) [name], Rounding, MD as Rounding Team (Cardiology)  This Provider for this visit: Treatment Team:  Attending Provider: Kalman Shan, MD   11/19/2022 -   Chief Complaint  Patient presents with   Follow-up    Breathing has been ok  PFT:11/19/22 ACT 20      Joy Joy Williams 72 y.o. -returns for follow-up.  She completed her ILD-Pro registry research visit today.  She tells me that in May 2024 she did call us with significant side effects from her nintedanib even at the low-dose protocol of 100 mg twice daily.  She had weight loss and diarrhea.  So we advised her to stop it.  She had already stopped it by then.  Currently she is feeling good her weight is gone up.  And her quality of life is better.  However she tells me that coinciding with this either because of stopping the nintedanib or because of the pollen according to her subjective report she is coughing more than usual.  She is also having some discolored sputum.  However her shortness of breath is around the same.  However on objective level overall her symptoms appear to be the same even compared to before.  We discussed about getting allergy workup today but took a shared decision making against this.  Access hypoxemia test did not show any desaturations but a pulmonary function test is suggesting decline.  Other issues - She did have questions about the ILD-Pro registry.  She is continue to participate in this but she wondered about blood test results from that.  I did indicate to her those were research labs and I do not have  access to what is being tested on that.  -Chronic systolic heart failure: A year ago her echo normalized.  Most recently yesterday she saw Dr. Laurance Flatten her cardiologist and was asked to continue Plavix and monitor her daily weights  -Lab review: August 28, 2022: Creatinine normal and hemoglobin normal   -Lung cancer recurrence surveillance: She will be due for his annual CT scan of the chest in September 2024 [also can be done for early progression]   -Smoking: She continues to smoke.   OV 03/04/2023  Subjective:  Patient ID: Joy [place], female , DOB: Mar 27, 1951 , age 85 y.o. , MRN: [id number] , ADDRESS: [address] [place]  [phone] PCP Joy Quitter, PA Patient Care Team: Joy Quitter, PA as PCP - General (Family Medicine) Parke Poisson, MD as PCP - Cardiology (Cardiology) Joy Cowden, MD as Consulting Physician (Pulmonary Disease) Joy Drain, MD as Consulting Physician (Rheumatology) Joy Dago, MD as Referring Physician Joy Cashing, MD as Referring Physician (Internal Medicine) Joy Munson, MD as Consulting Physician (Infectious Diseases) [name], Rounding, MD as Rounding Team (Cardiology)  This Provider for this visit: Treatment Team:  Attending Provider: Kalman Shan, MD    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  performed today. Pt states she has been doing okay since last visit and states she is about the same.    Joy Joy Williams 72 y.o. -presents for follow-up.  Last seen in May 2022.  At the time initiated ILD work-up.  Her ANA is strongly positive.  She has history of Raynaud's.  So we sent her to rheumatology.  She saw Dr Joy Williams only recently.  First note reviewed.  No specific connective tissue disease identified. AVISE was sent out and they texted me today saying the results were negative.  Patient continues to overall feel the same.  She had pulmonary function test that shows decline over time.  Approximately 6 %/year over the last 18  months.  This is when FVC and DLCO are averaged.  She is definitely not feeling the difference.  Of note she had a Takotsubo cardiomyopathy in June 2022.  By July 2022 her echo normalized.  She will consult cardiac rehabilitation soon.  She continues to smoke but has reduced.  She nondistended importance of quitting smoking.  She will have a high-dose flu shot today.  We discussed COVID booster by Valent.       IMPRESSION: 1. There is mild pulmonary fibrosis in a pattern with apical to basal gradient, featuring irregular peripheral interstitial opacity, septal thickening, traction bronchiectasis, and subpleural bronchiolectasis at the lung basis without clear evidence of honeycombing. Fibrotic findings are worsened over time on prior examinations dating back to 11/16/2014. Findings are categorized as probable UIP per consensus guidelines: Diagnosis of Idiopathic Pulmonary Fibrosis: An Official ATS/ERS/JRS/ALAT Clinical Practice Guideline. Joy Williams Vol 198, Iss 5, [phone], Jan 18 2017. 2. Redemonstrated postoperative findings of anterior left upper lobe and dependent left lower lobe wedge resections. No evidence of malignant recurrence in the chest. 3. Coronary artery disease.   Aortic Atherosclerosis (ICD10-I70.0).     Electronically Signed   By: Joy Williams M.D.   OnResults for BRONWEN, PENDERGRAFT (MRN [id number]) as of 10/20/2020 16:40  Ref. Range 10/12/2020 16:37  Anti Nuclear Antibody (ANA) Latest Ref Range: NEGATIVE  POSITIVE (A)  ANA Pattern 1 Unknown Nuclear, Nucleolar (A)  ANA Titer 1 Latest Units: titer 1:1,280 (H)  Cyclic Citrullin Peptide Ab Latest Units: UNITS <16  ds DNA Ab Latest Units: IU/mL <1  Myeloperoxidase Abs Latest Units: AI <1.0  Serine Protease 3 Latest Units: AI <1.0  RA Latex Turbid. Latest Ref Range: <14 IU/mL <14  SSA (Ro) (ENA) Antibody, IgG Latest Ref Range: <1.0 NEG AI <1.0 NEG  SSB (La) (ENA) Antibody, IgG Latest Ref Range: <1.0 NEG  AI <1.0 NEG  Scleroderma (Scl-70) (ENA) Antibody, IgG Latest Ref Range: <1.0 NEG AI <1.0 NEG      OV 03/26/2021  Subjective:  Patient ID: Joy Williams, female , DOB: [birth year] , age 51 y.o. , MRN: [id number] , ADDRESS: [address] [place] [id number] PCP Mayer Masker, PA-C Patient Care Team: Mayer Masker, PA-C as PCP - General Joy Sprague, MD as PCP - Cardiology (Cardiology) Joy Cowden, MD as Consulting Physician (Pulmonary Disease) Joy Drain, MD as Consulting Physician (Rheumatology) Joy Dago, MD as Referring Physician Joy Cashing, MD as Referring Physician (Internal Medicine) Joy Munson, MD as Consulting Physician (Infectious Diseases) [name], Rounding, MD as Rounding Team (Cardiology)  This Provider for this visit: Treatment Team:  Attending Provider: Kalman Shan, MD   03/26/2021 -  non-IPF ILD (IPAF) progressive phenotype.    Joy Joy Williams 72 y.o. -  Team: Joy Quitter,  PA as PCP - General (Family Medicine) Joy Sprague, MD as PCP - Cardiology (Cardiology) Joy Cowden, MD as Consulting Physician (Pulmonary Disease) Joy Drain, MD as Consulting Physician (Rheumatology) Joy Dago, MD as Referring Physician Joy Cashing, MD as Referring Physician (Internal Medicine) Joy Munson, MD as Consulting Physician (Infectious Diseases) [name], Rounding, MD as Rounding Team (Cardiology)  This Provider for this visit: Treatment Team:  Attending Provider: Kalman Shan, MD   11/19/2022 -   Chief Complaint  Patient presents with   Follow-up    Breathing has been ok  PFT:11/19/22 ACT 20      Joy Joy Williams 72 y.o. -returns for follow-up.  She completed her ILD-Pro registry research visit today.  She tells me that in May 2024 she did call us with significant side effects from her nintedanib even at the low-dose protocol of 100 mg twice daily.  She had weight loss and diarrhea.  So we advised her to stop it.  She had already stopped it by then.  Currently she is feeling good her weight is gone up.  And her quality of life is better.  However she tells me that coinciding with this either because of stopping the nintedanib or because of the pollen according to her subjective report she is coughing more than usual.  She is also having some discolored sputum.  However her shortness of breath is around the same.  However on objective level overall her symptoms appear to be the same even compared to before.  We discussed about getting allergy workup today but took a shared decision making against this.  Access hypoxemia test did not show any desaturations but a pulmonary function test is suggesting decline.  Other issues - She did have questions about the ILD-Pro registry.  She is continue to participate in this but she wondered about blood test results from that.  I did indicate to her those were research labs and I do not have  access to what is being tested on that.  -Chronic systolic heart failure: A year ago her echo normalized.  Most recently yesterday she saw Dr. Laurance Flatten her cardiologist and was asked to continue Plavix and monitor her daily weights  -Lab review: August 28, 2022: Creatinine normal and hemoglobin normal   -Lung cancer recurrence surveillance: She will be due for his annual CT scan of the chest in September 2024 [also can be done for early progression]   -Smoking: She continues to smoke.   OV 03/04/2023  Subjective:  Patient ID: Joy Williams, female , DOB: Mar 27, 1951 , age 85 y.o. , MRN: [id number] , ADDRESS: [address] [place]  [id number] PCP Joy Quitter, PA Patient Care Team: Joy Quitter, PA as PCP - General (Family Medicine) Parke Poisson, MD as PCP - Cardiology (Cardiology) Joy Cowden, MD as Consulting Physician (Pulmonary Disease) Joy Drain, MD as Consulting Physician (Rheumatology) Joy Dago, MD as Referring Physician Joy Cashing, MD as Referring Physician (Internal Medicine) Joy Munson, MD as Consulting Physician (Infectious Diseases) [name], Rounding, MD as Rounding Team (Cardiology)  This Provider for this visit: Treatment Team:  Attending Provider: Kalman Shan, MD    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  09/23/2019 11:22 Joy EDT  Formatting of this note might be different from the original.  CT Chest   Indication:  Non-small cell lung cancer, monitor, C34.92 Malignant neoplasm  of unspecified part of left bronchus or lung (CMS-HCC).   Comparison Exams:  06/10/2019   Protocol:  Chest CT WO Protocol.  Contiguous 0.625 mm axial images with 5  mm axial, 3 mm coronal, and 3 mm sagittal reconstructions were obtained  from the neck base through the upper abdomen without IV contrast material.  In addition, 3-D maximal  intensity projection (MIP) reconstructions were  performed to potentially increase the sensitivity for the detection of  pulmonary nodules.   Findings:   The central airways are patent without endobronchial mass. The patient is  status post left upper lobe wedge resection which is stable in appearance.  Status post left lower lobe wedge resection with a similar appearance of  peripheral consolidation and groundglass lung with traction bronchiectasis  adjacent to the staple line. Redemonstrated findings of lower lobe  predominant pulmonary fibrosis with subpleural reticulation and probable  UIP pattern. Stable sub-6 mm pulmonary nodules and nodular opacities are  similar, index 5 mm pulmonary nodule in the anterior pleura of the right  upper lobe (1-191). New left lower lobe nodule on image 4-300 is favored to  represent endoluminal debris. No effusion or pneumothorax. High density  focus along the right diaphragm may represent pleural calcification.  Scattered calcified and noncalcified mediastinal and hilar lymph nodes,  measuring up to 1.3 cm in the left lower paratracheal region   Status post mitral valve are clear with mild cardiomegaly and coronary  artery calcifications in the LAD. No pericardial effusion.   There is a small hiatal hernia no aggressive osseous lesions.   The thyroid gland is unremarkable. No axillary adenopathy or chest wall  abnormality.   Impression:   Stable versus slightly increased in subpleural consolidation in the left  lower lobe adjacent to the surgical staple line. Given curvilinear  appearance on sagittal imaging, findings are favored to represent confluent  fibrosis over malignancy. Consider additional follow-up chest CT in 3-6  months.   Additional nodules/nodular opacities are similar. Attention on follow-up.   Electronically Reviewed by:  Charmayne Sheer, MD, [facility]  Electronically Reviewed on:  09/23/2019 11:14 Joy    No results  found.   Dec 30 2014 removed LUL lung tissue with 2 tumors/ and L vc paralyzed ever since - last chemo 05/06/15   Last [facility] onc note:  Date of diagnosis: 12/2014 Diagnosis: NSCLC (Synchronous primaries) Histology: Adenocarcinoma Mutation status: EGFR pending, ALK, ROS1 negative Stage: Adenocarcinoma: T1aN0M0 Squamous T2aN0M0 Prior therapy: 1. Surgical resection with wedge resection and mediastinal lymph node s   OV 02/15/2021  Subjective:  Patient ID: Joy Williams, female , DOB: [birth year] , age 50 y.o. , MRN: [id number] , ADDRESS: [address] [place] [id number] PCP Mayer Masker, PA-C Patient Care Team: Mayer Masker, PA-C as PCP - General Joy Sprague, MD as PCP - Cardiology (Cardiology) Joy Cowden, MD as Consulting Physician (Pulmonary Disease) Joy Drain, MD as Consulting Physician (Rheumatology) Joy Dago, MD as Referring Physician Joy Cashing, MD as Referring Physician (Internal Medicine) Joy Munson, MD as Consulting Physician (Infectious Diseases) [name], Rounding, MD as Rounding Team (Cardiology)  This Provider for this visit: Treatment Team:  Attending Provider: Kalman Shan, MD    02/15/2021 -   Chief Complaint  Patient presents with   Follow-up    PFT  OV 10/12/2020  Subjective:  Patient ID: Joy Williams, female , DOB: 05/29/1950 , age 28 y.o. , MRN: [id number] , ADDRESS: [address] [place] [phone] PCP Mayer Masker, PA-C Patient Care Team: Mayer Masker, PA-C as PCP - General Joy Sprague, MD as PCP - Cardiology (Cardiology) Lars Masson, MD (Inactive) as Consulting Physician (Cardiology) Joy Cowden, MD as Consulting Physician (Pulmonary Disease) Joy Drain, MD as Consulting Physician (Rheumatology) Joy Dago, MD as Referring Physician Joy Cashing, MD as Referring Physician (Internal Medicine) Joy Munson, MD as Consulting Physician (Infectious Diseases)  This Provider for this visit: Treatment Team:  Attending Provider: Kalman Shan, MD    10/12/2020 -   Chief Complaint  Patient presents with   Consult    Pt is being referred to Dr. Marchelle Gearing by Dr. Sherene Sires.  Pt states that she has an occ cough but denies any other complaints.   Transfer of care from Dr. Sandrea Hughs to Dr. Marchelle Gearing in the ILD center.  History is gained from talking to the patient and review of the medical records including [address] and visualization of 2016 or 2017 CT scan of the chest.  Joy Joy Williams 72 y.o. -used to live in [place].  She worked in the [employer] for [employer].  In 2016 she retired and came to [place].  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  Her primary care physician thought she might have crest syndrome because she had Raynaud's along with allergic cases and she had edema in her fingers.  After she moved here apparently she saw a rheumatologist [review of the chart indicates is Dr. Kellie Simmering who has since retired] and was told that she might not have scleroderma.  She has been on observation therapy from a rheumatological standpoint.  In fact never seen a  rheumatologist.  Then in 2016 she had early stage non-small cell lung cancer for which she underwent wedge resection left upper lobe.  She is followed up with [facility] since then.  She has had annual CT scans of the chest.  Most recent 1 was in May 2021.  She believes and per record review that she is in complete remission and I believe she might be discharged from follow-up there.  She tells me that at baseline she has very mild shortness of breath that is stable as documented below.  She also has amount of cough that is also documented below.  Is not clear that this is necessarily worse.  She is maintained on inhaled corticosteroid and long-acting beta agonist.  She is not sure if this is because of her diagnose of COPD other than the fact that she is on it for respiratory symptoms.  Most recently when she followed up with Dr. Thurston Hole review of the records revealed that there was interstitial lung disease findings on the CT scan of the chest in May 2021 at [facility].  In my review of the notes it appears that the pattern might have changed.  Infection was present even in 2016 2017 and currently the pattern might be changed.  She is definitely not feeling it.  Her symptoms are minimal and her walking desaturation test is normal.  Because of the ILD changes she has been referred to the ILD center    CT [facility] (no image avail)  Interface, Rad Results In -  performed today. Pt states she has been doing okay since last visit and states she is about the same.    Joy Joy Williams 72 y.o. -presents for follow-up.  Last seen in May 2022.  At the time initiated ILD work-up.  Her ANA is strongly positive.  She has history of Raynaud's.  So we sent her to rheumatology.  She saw Dr Joy Williams only recently.  First note reviewed.  No specific connective tissue disease identified. AVISE was sent out and they texted me today saying the results were negative.  Patient continues to overall feel the same.  She had pulmonary function test that shows decline over time.  Approximately 6 %/year over the last 18  months.  This is when FVC and DLCO are averaged.  She is definitely not feeling the difference.  Of note she had a Takotsubo cardiomyopathy in June 2022.  By July 2022 her echo normalized.  She will consult cardiac rehabilitation soon.  She continues to smoke but has reduced.  She nondistended importance of quitting smoking.  She will have a high-dose flu shot today.  We discussed COVID booster by Valent.       IMPRESSION: 1. There is mild pulmonary fibrosis in a pattern with apical to basal gradient, featuring irregular peripheral interstitial opacity, septal thickening, traction bronchiectasis, and subpleural bronchiolectasis at the lung basis without clear evidence of honeycombing. Fibrotic findings are worsened over time on prior examinations dating back to 11/16/2014. Findings are categorized as probable UIP per consensus guidelines: Diagnosis of Idiopathic Pulmonary Fibrosis: An Official ATS/ERS/JRS/ALAT Clinical Practice Guideline. Joy Williams Vol 198, Iss 5, [phone], Jan 18 2017. 2. Redemonstrated postoperative findings of anterior left upper lobe and dependent left lower lobe wedge resections. No evidence of malignant recurrence in the chest. 3. Coronary artery disease.   Aortic Atherosclerosis (ICD10-I70.0).     Electronically Signed   By: Joy Williams M.D.   OnResults for BRONWEN, PENDERGRAFT (MRN [id number]) as of 10/20/2020 16:40  Ref. Range 10/12/2020 16:37  Anti Nuclear Antibody (ANA) Latest Ref Range: NEGATIVE  POSITIVE (A)  ANA Pattern 1 Unknown Nuclear, Nucleolar (A)  ANA Titer 1 Latest Units: titer 1:1,280 (H)  Cyclic Citrullin Peptide Ab Latest Units: UNITS <16  ds DNA Ab Latest Units: IU/mL <1  Myeloperoxidase Abs Latest Units: AI <1.0  Serine Protease 3 Latest Units: AI <1.0  RA Latex Turbid. Latest Ref Range: <14 IU/mL <14  SSA (Ro) (ENA) Antibody, IgG Latest Ref Range: <1.0 NEG AI <1.0 NEG  SSB (La) (ENA) Antibody, IgG Latest Ref Range: <1.0 NEG  AI <1.0 NEG  Scleroderma (Scl-70) (ENA) Antibody, IgG Latest Ref Range: <1.0 NEG AI <1.0 NEG      OV 03/26/2021  Subjective:  Patient ID: Joy Williams, female , DOB: [birth year] , age 51 y.o. , MRN: [id number] , ADDRESS: [address] [place] [phone] PCP Mayer Masker, PA-C Patient Care Team: Mayer Masker, PA-C as PCP - General Joy Sprague, MD as PCP - Cardiology (Cardiology) Joy Cowden, MD as Consulting Physician (Pulmonary Disease) Joy Drain, MD as Consulting Physician (Rheumatology) Joy Dago, MD as Referring Physician Joy Cashing, MD as Referring Physician (Internal Medicine) Joy Munson, MD as Consulting Physician (Infectious Diseases) [name], Rounding, MD as Rounding Team (Cardiology)  This Provider for this visit: Treatment Team:  Attending Provider: Kalman Shan, MD   03/26/2021 -  non-IPF ILD (IPAF) progressive phenotype.    Joy Joy Williams 72 y.o. -  OV 10/12/2020  Subjective:  Patient ID: Joy Williams, female , DOB: 05/29/1950 , age 28 y.o. , MRN: [id number] , ADDRESS: [address] [place] [phone] PCP Mayer Masker, PA-C Patient Care Team: Mayer Masker, PA-C as PCP - General Joy Sprague, MD as PCP - Cardiology (Cardiology) Lars Masson, MD (Inactive) as Consulting Physician (Cardiology) Joy Cowden, MD as Consulting Physician (Pulmonary Disease) Joy Drain, MD as Consulting Physician (Rheumatology) Joy Dago, MD as Referring Physician Joy Cashing, MD as Referring Physician (Internal Medicine) Joy Munson, MD as Consulting Physician (Infectious Diseases)  This Provider for this visit: Treatment Team:  Attending Provider: Kalman Shan, MD    10/12/2020 -   Chief Complaint  Patient presents with   Consult    Pt is being referred to Dr. Marchelle Gearing by Dr. Sherene Sires.  Pt states that she has an occ cough but denies any other complaints.   Transfer of care from Dr. Sandrea Hughs to Dr. Marchelle Gearing in the ILD center.  History is gained from talking to the patient and review of the medical records including [address] and visualization of 2016 or 2017 CT scan of the chest.  Joy Joy Williams 72 y.o. -used to live in [place].  She worked in the [employer] for [employer].  In 2016 she retired and came to [place].  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  Her primary care physician thought she might have crest syndrome because she had Raynaud's along with allergic cases and she had edema in her fingers.  After she moved here apparently she saw a rheumatologist [review of the chart indicates is Dr. Kellie Simmering who has since retired] and was told that she might not have scleroderma.  She has been on observation therapy from a rheumatological standpoint.  In fact never seen a  rheumatologist.  Then in 2016 she had early stage non-small cell lung cancer for which she underwent wedge resection left upper lobe.  She is followed up with [facility] since then.  She has had annual CT scans of the chest.  Most recent 1 was in May 2021.  She believes and per record review that she is in complete remission and I believe she might be discharged from follow-up there.  She tells me that at baseline she has very mild shortness of breath that is stable as documented below.  She also has amount of cough that is also documented below.  Is not clear that this is necessarily worse.  She is maintained on inhaled corticosteroid and long-acting beta agonist.  She is not sure if this is because of her diagnose of COPD other than the fact that she is on it for respiratory symptoms.  Most recently when she followed up with Dr. Thurston Hole review of the records revealed that there was interstitial lung disease findings on the CT scan of the chest in May 2021 at [facility].  In my review of the notes it appears that the pattern might have changed.  Infection was present even in 2016 2017 and currently the pattern might be changed.  She is definitely not feeling it.  Her symptoms are minimal and her walking desaturation test is normal.  Because of the ILD changes she has been referred to the ILD center    CT Chest [facility] (no image avail)  Interface, Rad Results In -

## 2023-03-06 ENCOUNTER — Ambulatory Visit: Payer: Medicare Other | Admitting: Family Medicine

## 2023-03-12 ENCOUNTER — Encounter: Payer: Self-pay | Admitting: Internal Medicine

## 2023-03-12 DIAGNOSIS — Z85118 Personal history of other malignant neoplasm of bronchus and lung: Secondary | ICD-10-CM

## 2023-03-12 NOTE — Telephone Encounter (Signed)
I do not see a referral to [facility] in her chart.

## 2023-03-13 NOTE — Telephone Encounter (Signed)
Stop wellbutrin   [facility] referral for concern for lung cancer recurrence - refer t horacic surgery

## 2023-03-19 ENCOUNTER — Telehealth: Payer: Self-pay | Admitting: Internal Medicine

## 2023-03-19 NOTE — Telephone Encounter (Signed)
[facility] would like final report of PFT results. [facility] phone number is [phone] option 1. Fax number is [phone].

## 2023-03-24 NOTE — Telephone Encounter (Signed)
Called and informed pt I will be sending over to [facility]. I have faxed this and received confirmation

## 2023-04-03 ENCOUNTER — Telehealth: Payer: Medicare Other | Admitting: Family Medicine

## 2023-04-03 ENCOUNTER — Encounter: Payer: Self-pay | Admitting: Family Medicine

## 2023-04-03 DIAGNOSIS — E039 Hypothyroidism, unspecified: Secondary | ICD-10-CM

## 2023-04-03 NOTE — Progress Notes (Signed)
Virtual Visit via Video Note  I connected with Seleste Jakeway Admire on 04/03/23 at  1:30 PM EST by a video enabled telemedicine application and verified that I am speaking with the correct person using two identifiers.  Patient Location: Home Provider Location: Office/clinic  I discussed the limitations, risks, security, and privacy concerns of performing an evaluation and management service by video and the availability of in person appointments. I also discussed with the patient that there may be a patient responsible charge related to this service. The patient expressed understanding and agreed to proceed.    Subjective   Patient ID: TIAIRA CROSTHWAITE, female    DOB: [birth year]  Age: 72 y.o. MRN: [id number]  No chief complaint on file.   HPI ROZALYNN ENSLOW is a 72 y.o. female presenting today for follow up of hypothyroidism.  Labs on 03/03/2023 indicated low TSH and elevated T4, levothyroxine dose reduced to 150 mcg daily. Denies fatigue, weight changes, heat/cold intolerance, skin/hair changes, bowel changes, CVS symptoms.  Overall patient is doing well and has had no issues.   ROS Negative unless otherwise noted in HPI  Outpatient Medications Prior to Visit  Medication Sig   acetaminophen (TYLENOL) 500 MG tablet Take 1,000 mg by mouth every 6 (six) hours as needed for mild pain or headache.   buPROPion (WELLBUTRIN SR) 150 MG 12 hr tablet TAKE 1 TABLET BY MOUTH TWICE A DAY   buPROPion (WELLBUTRIN SR) 150 MG 12 hr tablet Take 150mg  once a day for 3 days, increase to 150mg  twice a day   Cholecalciferol (VITAMIN D) 125 MCG (5000 UT) CAPS Take 5,000 Units by mouth daily.   clopidogrel (PLAVIX) 75 MG tablet Take 1 tablet (75 mg total) by mouth daily.   EPINEPHrine (EPIPEN 2-PAK) 0.3 mg/0.3 mL IJ SOAJ injection Inject 0.3 mg into the muscle as needed for anaphylaxis.   ferrous sulfate 325 (65 FE) MG tablet Take 1 tablet (325 mg total) by mouth daily.   fluticasone (FLONASE) 50 MCG/ACT  nasal spray Place 1 spray into both nostrils daily.   hydrALAZINE (APRESOLINE) 25 MG tablet Take 1 tablet (25 mg total) by mouth daily as needed (for systolic BP>150).   levothyroxine (SYNTHROID) 150 MCG tablet Take 1 tablet (150 mcg total) by mouth daily.   MELATONIN PO Take 1 tablet by mouth at bedtime as needed (sleep).   Multiple Vitamins-Minerals (MULTIVITAMIN WITH MINERALS) tablet Take 1 tablet by mouth daily.   rosuvastatin (CRESTOR) 20 MG tablet Take 1 tablet (20 mg total) by mouth every evening.   TRELEGY ELLIPTA 100-62.5-25 MCG/ACT AEPB INHALE 1 PUFF BY MOUTH EVERY DAY   Turmeric 500 MG CAPS Take 500 mg by mouth daily.   vitamin B-12 (CYANOCOBALAMIN) 1000 MCG tablet Take 1 tablet (1,000 mcg total) by mouth daily.   No facility-administered medications prior to visit.     Objective:     Physical Exam General: Speaking clearly in complete sentences without any shortness of breath.  Alert and oriented x3.  Normal judgment. No apparent acute distress.   Assessment & Plan:  Hypothyroidism, unspecified type Assessment & Plan: TSH 0.094 and T42.51 On 03/03/2023.  Decrease levothyroxine dose to 150 mcg daily at that time.  2 weeks from today, recheck thyroid panel to ensure dosing adjustment has resulted in appropriate lab results.  Orders: -     TSH; Future -     T4, free; Future    Return in about 2 weeks (around 04/17/2023) for labs at [facility]  facility.   I discussed the assessment and treatment plan with the patient. The patient was provided an opportunity to ask questions, and all were answered. The patient agreed with the plan and demonstrated an understanding of the instructions.   The patient was advised to call back or seek an in-person evaluation if the symptoms worsen or if the condition fails to improve as anticipated.  The above assessment and management plan was discussed with the patient. The patient verbalized understanding of and has agreed to the management  plan.   Melida Quitter, PA

## 2023-04-03 NOTE — Assessment & Plan Note (Signed)
TSH 0.094 and T42.51 On 03/03/2023.  Decrease levothyroxine dose to 150 mcg daily at that time.  2 weeks from today, recheck thyroid panel to ensure dosing adjustment has resulted in appropriate lab results.

## 2023-04-04 ENCOUNTER — Encounter: Payer: Self-pay | Admitting: Internal Medicine

## 2023-04-04 DIAGNOSIS — Z85118 Personal history of other malignant neoplasm of bronchus and lung: Secondary | ICD-10-CM

## 2023-04-04 NOTE — Telephone Encounter (Signed)
Mychart sent by pt:  [facility] (supporting Kalman Shan, MD)32 minutes ago (2:43 PM)    Dr Marchelle Gearing.   Dr. Derinda Late office called from [facility] to set up an appointment with him on Thursday December 12th.  The problem is they can't find a time slot to get a CT Scan done before the 12th and [facility] needs a current scan.  They are asking if you can order a CT Scan at [facility] so that they have a current scan for their review.  Is that possible?   Thanks Deb     Dr. Marchelle Gearing, please advise.

## 2023-04-05 ENCOUNTER — Other Ambulatory Visit: Payer: Self-pay | Admitting: Internal Medicine

## 2023-04-07 ENCOUNTER — Encounter: Payer: Self-pay | Admitting: Internal Medicine

## 2023-04-07 NOTE — Telephone Encounter (Signed)
I thought I okayed CT scan of the chest with contrast because of lung cancer evaluation.  Will need a chemistry check

## 2023-04-07 NOTE — Telephone Encounter (Signed)
She wil lneed one with contrast. Please check bmet as well for CT  Allergies  Allergen Reactions   Penicillins Other (See Comments) and Hives    Unknown, occurred as a child  Unknown, occurred as a child, Has patient had a PCN reaction causing immediate rash, facial/tongue/throat swelling, SOB or lightheadedness with hypotension: Yes, Has patient had a PCN reaction causing severe rash involving mucus membranes or skin necrosis: No, Has patient had a PCN reaction that required hospitalization , Has patient had a PCN reaction occurring within the last 10 years: No, If all of the above answers are "NO", then may proceed with Cephalosporin use., No   Bee Venom Swelling    Severe swelling at the sting site.   Ofev [Nintedanib] Diarrhea

## 2023-04-08 ENCOUNTER — Other Ambulatory Visit: Payer: Self-pay

## 2023-04-08 MED ORDER — TRELEGY ELLIPTA 100-62.5-25 MCG/ACT IN AEPB: INHALATION_SPRAY | RESPIRATORY_TRACT | 5 refills | Status: DC

## 2023-04-08 MED ORDER — CLOPIDOGREL BISULFATE 75 MG PO TABS: 75.0000 mg | ORAL_TABLET | Freq: Every day | ORAL | 2 refills | Status: DC

## 2023-04-08 NOTE — Telephone Encounter (Signed)
PT calling because Trelegy has not been filled . [facility]. She is out. Also, we will make appt for PFT BA and 30 min FU.  Her # is [phone] if you have quest.

## 2023-04-08 NOTE — Telephone Encounter (Signed)
Also, PT states she is waiting on [facility] to order a CT scan for her 2nd opinion visit at [facility] sched Dec 12th.

## 2023-04-09 NOTE — Telephone Encounter (Signed)
She needs to ge the CT chest atleast 1-2 weeks efore her appt at [facility]

## 2023-04-23 ENCOUNTER — Ambulatory Visit (HOSPITAL_COMMUNITY)
Admission: RE | Admit: 2023-04-23 | Discharge: 2023-04-23 | Disposition: A | Discharge status: Home / Self Care | Payer: Medicare Other | Source: Ambulatory Visit | Attending: Internal Medicine | Admitting: Internal Medicine

## 2023-04-23 DIAGNOSIS — J432 Centrilobular emphysema: Secondary | ICD-10-CM | POA: Diagnosis not present

## 2023-04-23 DIAGNOSIS — Z85118 Personal history of other malignant neoplasm of bronchus and lung: Secondary | ICD-10-CM | POA: Insufficient documentation

## 2023-04-23 DIAGNOSIS — I700 Atherosclerosis of aorta: Secondary | ICD-10-CM | POA: Diagnosis not present

## 2023-04-23 DIAGNOSIS — C3490 Malignant neoplasm of unspecified part of unspecified bronchus or lung: Secondary | ICD-10-CM | POA: Diagnosis not present

## 2023-04-23 MED ORDER — IOHEXOL 300 MG/ML  SOLN
75.0000 mL | Freq: Once | INTRAMUSCULAR | Status: AC | PRN
Start: 2023-04-23 — End: 2023-04-23
  Administered 2023-04-23: 75 mL via INTRAVENOUS

## 2023-04-24 DIAGNOSIS — N83202 Unspecified ovarian cyst, left side: Secondary | ICD-10-CM | POA: Diagnosis not present

## 2023-04-24 DIAGNOSIS — R102 Pelvic and perineal pain: Secondary | ICD-10-CM | POA: Diagnosis not present

## 2023-04-29 ENCOUNTER — Telehealth: Payer: Self-pay | Admitting: Internal Medicine

## 2023-04-29 DIAGNOSIS — J849 Interstitial pulmonary disease, unspecified: Secondary | ICD-10-CM

## 2023-04-29 NOTE — Telephone Encounter (Signed)
  I think she wants/needs a CD ROM of this CT to take to [facility]. Please checkw ith her  Thanks    SIGNATURE    Dr. Kalman Shan, M.D., F.C.C.P,  Pulmonary and Critical Care Medicine Staff Physician, [facility] Director - Interstitial Lung Disease  Program  Pulmonary Fibrosis [facility] at [facility] [place], [place], [address]   Pager: [phone], If no answer  -> Check AMION or Try [phone] Telephone (clinical office): [phone] Telephone (research): [phone]  12:08 PM 04/29/2023    rrative & Impression  CLINICAL DATA:  Lung cancer, chemotherapy complete. * Tracking Code: BO *   EXAM: CT CHEST WITH CONTRAST   TECHNIQUE: Multidetector CT imaging of the chest was performed during intravenous contrast administration.   RADIATION DOSE REDUCTION: This exam was performed according to the departmental dose-optimization program which includes automated exposure control, adjustment of the mA and/or kV according to patient size and/or use of iterative reconstruction technique.   CONTRAST:  75mL OMNIPAQUE IOHEXOL 300 MG/ML  SOLN   COMPARISON:  PET 01/07/2023.  CT chest 12/13/2022 and 10/18/2020.   FINDINGS: Cardiovascular: Atherosclerotic calcification of the aorta and coronary arteries. Heart is enlarged. Mitral valve replacement. No pericardial effusion.   Mediastinum/Nodes: Noncalcified mediastinal lymph nodes measure up to 12 mm in the low right paratracheal station, stable. No hilar or axillary adenopathy. Esophagus is grossly unremarkable.   Lungs/Pleura: Centrilobular emphysema. Peripheral and basilar predominant subpleural reticulation, ground-glass and traction bronchiectasis/bronchiolectasis. Pleural calcifications in the right hemithorax. Postoperative scarring in the left upper lobe. Additional postoperative scarring in the posterior left lower lobe with associated consolidation, measuring 1.8 x 3.0 cm  (7/99), stable from 12/13/2022, but enlarged from 0.8 x 1.5 cm on 10/18/2020. Mild associated hypermetabolism on PET 01/07/2023. No pleural fluid. Airway is unremarkable.   Upper Abdomen: Slight thickening of the left adrenal gland. No specific follow-up necessary. Small hiatal hernia. Visualized portions of the liver, adrenal glands, kidneys, spleen, pancreas, stomach and bowel are otherwise grossly unremarkable. No upper abdominal adenopathy.   Musculoskeletal: Degenerative changes in the spine. No worrisome lytic or sclerotic lesions.   IMPRESSION: 1. Subpleural nodular consolidation associated with a suture line in the posterior left lower lobe, stable from 12/13/2022 but enlarged from 10/18/2020 and mildly hypermetabolic on PET. Disease recurrence cannot be excluded. 2. 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 3. Aortic atherosclerosis (ICD10-I70.0). Coronary artery calcification. 4.  Emphysema (ICD10-J43.9).     Electronically Signed   By: Leanna Battles M.D.   On: 04/28/2023 13:06

## 2023-05-01 DIAGNOSIS — J449 Chronic obstructive pulmonary disease, unspecified: Secondary | ICD-10-CM | POA: Diagnosis not present

## 2023-05-01 DIAGNOSIS — C3482 Malignant neoplasm of overlapping sites of left bronchus and lung: Secondary | ICD-10-CM | POA: Diagnosis not present

## 2023-05-01 DIAGNOSIS — R911 Solitary pulmonary nodule: Secondary | ICD-10-CM | POA: Diagnosis not present

## 2023-05-02 ENCOUNTER — Telehealth (HOSPITAL_COMMUNITY): Payer: Self-pay

## 2023-05-05 ENCOUNTER — Telehealth (HOSPITAL_COMMUNITY): Payer: Self-pay

## 2023-05-05 NOTE — Telephone Encounter (Signed)
Pt insurance is active and benefits verified through Medicare a/b Co-pay 0, DED $350/$0 met, out of pocket $6,500/$175.18 met, co-insurance 30%. no pre-authorization required.  2ndary insurance is active and benefits verified through [employer]. Co-pay 0, DED $350/0 met, out of pocket $6,500/$175.18 met, co-insurance 30%. No pre-authorization required, [place]/[employer] 05/05/2023@1 :28, REF# [id number]

## 2023-05-05 NOTE — Telephone Encounter (Signed)
Office referral recv'ed, printed and given to RN for review. 

## 2023-05-08 ENCOUNTER — Telehealth (HOSPITAL_COMMUNITY): Payer: Self-pay

## 2023-05-08 ENCOUNTER — Encounter: Payer: Self-pay | Admitting: Internal Medicine

## 2023-05-08 NOTE — Telephone Encounter (Signed)
Called and spoke with pt in regards to PR, pt stated she would like to hold off on PR at this time due to other health issues and scheduling.   Closed referral

## 2023-05-09 NOTE — Telephone Encounter (Signed)
Spoke with the pt  [facility] has seen her images- no disc needed   She wanted me to let MR know that she is scheduled at [facility] on 05/26/23 for CT Lung Biopsy  You should be able to view [facility] Williams records in Care Everywhere

## 2023-05-16 NOTE — Telephone Encounter (Signed)
I asked for a followup in feb 2025 with spiro/dlco but this can now be in march 2025. This will give her time to do the things at [facility]. Please give ILD 30 min visit . SPiro/dlco is ordered

## 2023-05-22 ENCOUNTER — Telehealth (HOSPITAL_BASED_OUTPATIENT_CLINIC_OR_DEPARTMENT_OTHER): Payer: Self-pay | Admitting: Internal Medicine

## 2023-05-23 ENCOUNTER — Encounter: Payer: Self-pay | Admitting: Internal Medicine

## 2023-05-26 ENCOUNTER — Telehealth: Payer: Self-pay | Admitting: Internal Medicine

## 2023-05-26 NOTE — Telephone Encounter (Signed)
 The patient had a ct scan w/ contrast in December and is scheduled for another Ct scan at [facility] in [place]. Is the current CT order for February still necessary? If it's not, could it please it be removed? Thanks!

## 2023-05-27 NOTE — Telephone Encounter (Signed)
 Pls cancel any CT order between now and her visit with me in [place] 2025

## 2023-05-31 NOTE — Addendum Note (Signed)
 Addended by: Wyvonne Lenz on: 05/31/2023 04:15 PM   Modules accepted: Orders

## 2023-05-31 NOTE — Telephone Encounter (Signed)
CT orders have been cancelled.

## 2023-06-05 DIAGNOSIS — C3482 Malignant neoplasm of overlapping sites of left bronchus and lung: Secondary | ICD-10-CM | POA: Diagnosis not present

## 2023-06-05 DIAGNOSIS — R911 Solitary pulmonary nodule: Secondary | ICD-10-CM | POA: Diagnosis not present

## 2023-06-05 DIAGNOSIS — C3492 Malignant neoplasm of unspecified part of left bronchus or lung: Secondary | ICD-10-CM | POA: Diagnosis not present

## 2023-06-05 DIAGNOSIS — C3432 Malignant neoplasm of lower lobe, left bronchus or lung: Secondary | ICD-10-CM | POA: Diagnosis not present

## 2023-06-05 DIAGNOSIS — R918 Other nonspecific abnormal finding of lung field: Secondary | ICD-10-CM | POA: Diagnosis not present

## 2023-06-05 DIAGNOSIS — Z5181 Encounter for therapeutic drug level monitoring: Secondary | ICD-10-CM | POA: Diagnosis not present

## 2023-06-05 DIAGNOSIS — Z952 Presence of prosthetic heart valve: Secondary | ICD-10-CM | POA: Diagnosis not present

## 2023-06-10 ENCOUNTER — Telehealth (HOSPITAL_COMMUNITY): Payer: Self-pay

## 2023-06-10 NOTE — Telephone Encounter (Signed)
Pt called and stated she is now interested in PR, will pass her information from December to RN for review.

## 2023-06-12 DIAGNOSIS — F17209 Nicotine dependence, unspecified, with unspecified nicotine-induced disorders: Secondary | ICD-10-CM | POA: Diagnosis not present

## 2023-06-12 DIAGNOSIS — C3482 Malignant neoplasm of overlapping sites of left bronchus and lung: Secondary | ICD-10-CM | POA: Diagnosis not present

## 2023-06-13 ENCOUNTER — Telehealth (HOSPITAL_COMMUNITY): Payer: Self-pay

## 2023-06-13 NOTE — Telephone Encounter (Signed)
Pt insurance is active and benefits verified through Medicare A/B. Co-pay $0.00, DED $257.00/$0.00 met, out of pocket $0.00/$0.00 met, co-insurance 20%. No pre-authorization required. 06/13/23 @ 10:29AM

## 2023-06-13 NOTE — Telephone Encounter (Signed)
Pt called in regards to scheduling for PR. Patient will come in for orientation on 06/16/23 @ 10:30AM and will attend the 10:15AM exercise class.   Pensions consultant.

## 2023-06-16 ENCOUNTER — Inpatient Hospital Stay (HOSPITAL_COMMUNITY): Admission: RE | Admit: 2023-06-16 | Payer: Medicare Other | Source: Ambulatory Visit

## 2023-06-16 ENCOUNTER — Encounter (HOSPITAL_COMMUNITY): Payer: Self-pay

## 2023-06-16 NOTE — Progress Notes (Signed)
Pt came in for her Pulmonary rehab orientation and 6 minute walk test today. She stated she has to meet with her pulmonologist to go over her lung biopsy results on 07/03/23. We recommended pt wait to start the program after she meets with her pulmonologist. We will reschedule her orientation after she is cleared by her pulmonologist.

## 2023-06-17 NOTE — Telephone Encounter (Signed)
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    ase Report Fine Needle Aspirate                              Case: [id number]                               Authorizing Provider:  Madlyn Frankel, Collected:           06/05/2023 1123                                    PA                                                                         Ordering Location:     [facility]              Received:            06/05/2023 1323             Pathologist:           Buelah Manis, MD                                                   Specimen:    Lung, Left                                                                            Specimen Source A   Lung, left  Diagnostic Interpretation A DIAGNOSTIC OF MALIGNANCY.  Electronically signed by Buelah Manis, MD on 06/06/2023 at 12:44 PM     Atypical mucinous epithelium, consistent with a recurrent well-differentiated mucinous adenocarcinoma.   Comment: Immunohistochemistry will be performed to confirm the pathologic process. The results will be reported in an addendum.  Addendum   The immunoprofile is non-specific for a site of origin with staining only for CK7. Given imaging showing a mass like abnormality at the left lower lobe wedge resection margin with PET activity, a recurrent lung primary remains the most likely process.   The following immunohistochemistry was performed after review of the clinical history and morphology to further characterize the pathologic process. The results are as follows:   A1-13    TTF-1                 Stains entrapped benign pulmonary epithelium A1-14    P40  Stains entrapped benign pulmonary epithelium A1-15    CK7                   Positive A1-16    CK20                 Negative A1-17    PAX8                  Negative A1-18    SATB2               Negative A1-19    CDX-2                Negative A1-20NAPSINAStains entrapped benign pulmonary epithelium  Addendum electronically signed by Buelah Manis, MD on 06/12/2023 at 12:17 PM  Procedure Notes   CT-guided Needle Core Biopsy of left lung performed by Dr. Edger House, Radiologist.  Clinical Information   Patient with a history of lung cancer (Adeno-LLL, squamous cell -LUL) s/p wedge resections presents with mass like soft tissue at the LLL wedge resection margin with PET-activity worrisome for recurrence.   Material Submitted   A1) 10 mls clear formalin received. 2 air dried smear(s) received. 1 core biopsy received.   A2) 10 mls clear formalin received. 1 core biopsy received.

## 2023-06-17 NOTE — Telephone Encounter (Signed)
Results are in Care Everywhere under the Lab tab.

## 2023-06-20 ENCOUNTER — Telehealth: Payer: Self-pay | Admitting: Internal Medicine

## 2023-06-20 DIAGNOSIS — C801 Malignant (primary) neoplasm, unspecified: Secondary | ICD-10-CM

## 2023-06-20 DIAGNOSIS — Z85118 Personal history of other malignant neoplasm of bronchus and lung: Secondary | ICD-10-CM

## 2023-06-20 NOTE — Telephone Encounter (Signed)
I called and spoke with pt. Pt was advised of MR note. I informed her that I would have the referrals made and sent and she could expect a phone call to have these scheduled. Pt verbalized understanding. NFN.   Referrals sent.

## 2023-06-20 NOTE — Telephone Encounter (Signed)
PT had a lung biopsy that showed cancer. She asked me to make an appt w/a [facility] oncologist. I adv she'd need a referral. Please call PT to advise @ [phone]

## 2023-06-20 NOTE — Telephone Encounter (Signed)
Okay I am glad that [facility] was able to do a biopsy and get an answer.  However I am disappointed to hear that she had a negative experience at the bedside with the doctor at [facility].  Yes I did review the results and it is lung cancer recurrence  Plan - Refer to Dr. Si Gaul in thoracic oncology -Also radiation oncology [there are many good doctors there including Dr. Kathrynn Running and Dr. Mitzi Hansen and Dr. Roselind Messier) -she can see anyone of them

## 2023-06-20 NOTE — Telephone Encounter (Signed)
I called and spoke with the pt. Pt states she had a lung biopsy that showed cancer. Pt states she would like a referral to oncologists for this. She had a biopsy done at [facility] bc she was not happy with the thoracic surgeon. Pt states the report showed some cancer. Pt stated MR has the results of the biopsy. Pt states she does not have a specific oncologist and does not know of any. Pt states [facility] informed her of radiology oncology. Pt would like to know her options and a referral sent to where Dr Marchelle Gearing would advise her to go. Please advise.

## 2023-06-24 ENCOUNTER — Ambulatory Visit (HOSPITAL_COMMUNITY): Payer: Medicare Other

## 2023-06-24 NOTE — Progress Notes (Signed)
  [facility] [phone]   [phone]  CONSULT NOTE  REFERRING PHYSICIAN: Dr. Charis   REASON FOR CONSULTATION:  Non-small cell lung cancer, mucinous adenocarcinoma  HPI Joy Williams is a 73 y.o. female with a past medical history significant for possible Raynaud's/scleroderma, NSTEMI (2020/2022), right carotid artery stenosis, Takotsubo cardiomyopathy, mitral regurgitation (s/p valve replacement), diastolic heart failure, COPD, hypothyroidism, osteopenia, vitamin D  deficiency, hyperlipidemia, and TIA/stroke (2019) is referred to the clinic for recurrent lung cancer.  The patient was diagnosed with synchronous primary lung cancers in 2016.  She was previously followed by [facility]. She underwent surgical resection.  The pathology of a left lower lobe wedge resection showed adenocarcinoma which measured 2.8 cm and staged as T2a N0 (visceral pleural involvement).  She had left upper wedge resection showing non-small cell lung cancer, squamous cell carcinoma measuring 4.9 cm staged as T2b, N0.   She subsequently underwent 4 cycles of adjuvant chemotherapy under the care of medical oncologist Dr. Annell with carboplatin and gemcitabine  Per chart review, she was ALK, MET, RET and ROS 1 negative. Her PDL1 expression was <1%. She does have KRAS G12C. The EGFR status is negative.  She had been undergoing surveillance imaging.   She first presented for surveillance CT chest scan on 12/13/22, which demonstrated an increased size of mass-like soft tissue measuring up to 3.0 cm in the posterior left lower lobe encasing a wedge resection suture line. PET/CT scan on 01/07/23 demonstrated mild hypermetabolic activity in left lower lobe consolidation.   She had repeat CT chest on 04/23/23. This re-demonstrated stable subpleural nodular consolidation associated with the suture line in the posterior left lower lobe compared to 12/13/22 but enlarging since 10/18/2020. Given  that this was mildly hypermetabolic, disease recurrence could not be excluded. Her scan also showed pulmonary fibrosis.   Therefore, the patient underwent CT guided biopsy at [facility] on 06/05/23. The final pathology showed recurrent well differentiated adenocarcinoma.   The patient was interested in hearing her nonsurgical options.  She is reluctant to consider surgery, although she does have PFTs scheduled at [facility] tomorrow and follow-up with her surgeon next week on 07/03/2023.  The patient met with radiation oncology earlier today who discussed being a candidate for 5 fractions of radiation to the area of local recurrence.  They also recommended completing the staging workup with a brain MRI and repeat PET scan.  However the patient is scheduled for a MRI at [facility] and she is going to cancel that so this can be ordered/performed locally.   Today, she is feeling fairly well. At baseline, she endorses exertional dyspnea and chronic cough. She denies chest pain, hemoptysis, fever, night sweats, or visual disturbances. She sometimes has a mild temporal headache that resolved spontaneously without any intervention.   [name] participates in swim classes during the summer and tries to get 4000-6000 steps daily.    He is also being considered for pulmonary rehab but was waiting to see what her schedule would look like with all of her doctors visits.    Her family history consists of a mother who had Alzheimer's and mitral valve regurgitation.  Her father had dementia and prostate cancer.  She has a brother and a sister who also have mitral valve regurgitation.  The patient used to work in IT.  She is divorced.  She has 2 children.  She quit smoking approximately 1 week ago.  She did have periods where she quit smoking for several years.  She estimates she smoked a total of 20 years.  A pack of cigarettes would last her 3 to 4 days.  Has an alcoholic beverage approximately once every 3 months.  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   Complication of anesthesia    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 [facility] (had to have botox injection)   COPD (chronic obstructive pulmonary disease) (HCC)    Family history of adverse reaction to anesthesia    Mother- very sensitive to medication   Heart murmur    MVP   Hypothyroidism    Lung cancer (HCC) 12/30/2014   Synchronous primaries:  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, each treated by wedge resection with post-op adjuvant chemoRx at [facility]   MVP (mitral valve prolapse) 2018   s/p Minimally-Invasive Mitral Valve Replacement w/  [facility] Mitral bovine bioprosthetic tissue valve (size 31mm, model # 7300TFX, serial # [id number])   PONV (postoperative nausea and vomiting)    Raynaud's syndrome    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   31 mm [facility] mitral bovine bioprosthetic tissue valve placed via right mini thoracotomy approach   Severe mitral regurgitation 11/15/2014   Shortness of breath dyspnea    with exertion   STD (sexually transmitted disease)    STEMI (ST elevation myocardial infarction) (HCC) 10/24/2020   Telangiectasia     Past Surgical History:  Procedure Laterality Date   BACK SURGERY     x 3  Disectomy   BREAST BIOPSY Left    CARDIAC CATHETERIZATION N/A 03/08/2016   Procedure: Right/Left Heart Cath and Coronary Angiography;  Surgeon: Ozell Fell, MD;  Location: [facility] INVASIVE CV LAB;  Service: Cardiovascular;  Laterality: N/A;   CLAVICLE SURGERY Left 2013   plate to left collar bone   ENDARTERECTOMY Right 08/09/2020   Procedure: RIGHT CAROTID ENDARTERECTOMY;  Surgeon: Sheree Penne Bruckner, MD;  Location: [facility] OR;  Service: Vascular;  Laterality:  Right;   LAPAROSCOPY     ? reason-age 62    LEFT HEART CATH AND CORONARY ANGIOGRAPHY N/A 02/25/2019   Procedure: LEFT HEART CATH AND CORONARY ANGIOGRAPHY;  Surgeon: Dann Candyce RAMAN, MD;  Location: [facility] INVASIVE CV LAB;  Service: Cardiovascular;  Laterality: N/A;   LEFT HEART CATH AND CORONARY ANGIOGRAPHY N/A 10/24/2020   Procedure: LEFT HEART CATH AND CORONARY ANGIOGRAPHY;  Surgeon: Dann Candyce RAMAN, MD;  Location: [facility] INVASIVE CV LAB;  Service: Cardiovascular;  Laterality: N/A;   LUNG CANCER SURGERY     MITRAL VALVE REPAIR Right 06/27/2016   Procedure: MINIMALLY INVASIVE MITRAL VALVE REPLACEMENT;  Surgeon: Sudie VEAR Laine, MD;  Location: [facility] OR;  Service: Open Heart Surgery;  Laterality: Right;   TEE WITHOUT CARDIOVERSION N/A 02/22/2016   Procedure: TRANSESOPHAGEAL ECHOCARDIOGRAM (TEE);  Surgeon: Leim VEAR Moose, MD;  Location: [facility] ENDOSCOPY;  Service: Cardiovascular;  Laterality: N/A;   TEE WITHOUT CARDIOVERSION N/A 06/27/2016   Procedure: TRANSESOPHAGEAL ECHOCARDIOGRAM (TEE);  Surgeon: Sudie VEAR Laine, MD;  Location: [facility] OR;  Service: Open Heart Surgery;  Laterality: N/A;   TONSILLECTOMY     VIDEO ASSISTED THORACOSCOPY (VATS)/WEDGE RESECTION Left 12/30/2014   Bronchoscopy, Mediastinoscopy, Left VATS for Wedge resection LUL x2  adn LLL x1 - Dr. Medora at [facility]   VOCAL CORD INJECTION Left 2017   injected with botox   wrist surgery Left 2015   plate to wrist     Family History  Problem Relation Age of Onset   Mitral valve prolapse Mother    Dementia Father    Prostate cancer Father    Mitral valve prolapse Sister    Mitral valve prolapse Brother    Healthy Son    Healthy Daughter    Colon cancer Neg Hx    Colon polyps Neg Hx    Rectal cancer Neg Hx    Esophageal cancer Neg Hx    Stomach cancer Neg Hx    Heart attack Neg Hx     Social History Social History   Tobacco Use   Smoking status: Former    Current packs/day: 1.00    Average packs/day: 1 pack/day for 40.1 years (40.1  ttl pk-yrs)    Types: Cigarettes    Start date: 05/21/1983    Passive exposure: Never   Smokeless tobacco: Never   Tobacco comments:    Currently smoking 5 cigs a day as of 01/13/23 Tay  Vaping Use   Vaping status: Never Used  Substance Use Topics   Alcohol use: Yes    Alcohol/week: 2.0 standard drinks of alcohol    Types: 1 Shots of liquor, 1 Standard drinks or equivalent per week    Comment: occ   Drug use: No    Allergies  Allergen Reactions   Penicillins Other (See Comments) and Hives    Unknown, occurred as a child  Unknown, occurred as a child, Has patient had a PCN reaction causing immediate rash, facial/tongue/throat swelling, SOB or lightheadedness with hypotension: Yes, Has patient had a PCN reaction causing severe rash involving mucus membranes or skin necrosis: No, Has patient had a PCN reaction that required hospitalization , Has patient had a PCN reaction occurring within the last 10 years: No, If all of the above answers are NO, then may proceed with Cephalosporin use., No   Bee Venom Swelling    Severe swelling at the sting site.   Ofev  [Nintedanib] Diarrhea    Current Outpatient Medications  Medication Sig Dispense Refill   acetaminophen  (TYLENOL ) 500 MG tablet Take 1,000 mg by mouth every 6 (six) hours as needed for mild pain or headache.     Cholecalciferol (VITAMIN D ) 125 MCG (5000 UT) CAPS Take 5,000 Units by mouth daily.     clopidogrel  (PLAVIX ) 75 MG tablet Take 1 tablet (75 mg total) by mouth daily. 90 tablet 2   EPINEPHrine  (EPIPEN  2-PAK) 0.3 mg/0.3 mL IJ SOAJ injection Inject 0.3 mg into the muscle as needed for anaphylaxis. 1 each 1   EPINEPHrine  0.3 mg/0.3 mL IJ SOAJ injection Inject 0.3 mg into the muscle as needed.     ferrous sulfate  325 (65 FE) MG tablet Take 1 tablet (325 mg total) by mouth daily. (Patient taking differently: Take 325 mg by mouth daily. OTC) 90 tablet 3   Fexofenadine HCl (MUCINEX  ALLERGY PO) Take 1 tablet by mouth daily.      fluticasone  (FLONASE ) 50 MCG/ACT nasal spray Place 1 spray into both nostrils daily.     Fluticasone -Umeclidin-Vilant (TRELEGY ELLIPTA ) 100-62.5-25 MCG/ACT AEPB INHALE 1 PUFF BY MOUTH EVERY DAY 60 each 5   hydrALAZINE  (APRESOLINE ) 25 MG tablet Take 1 tablet (25 mg total) by mouth daily as needed (for systolic BP>150). 30 tablet 9   levothyroxine  (SYNTHROID ) 150  MCG tablet Take 1 tablet (150 mcg total) by mouth daily. 90 tablet 3   MELATONIN PO Take 1 tablet by mouth at bedtime as needed (sleep).     Multiple Vitamins-Minerals (MULTIVITAMIN WITH MINERALS) tablet Take 1 tablet by mouth daily.     nitroGLYCERIN  (NITROSTAT ) 0.4 MG SL tablet Place 0.4 mg under the tongue every 5 (five) minutes as needed.     rosuvastatin  (CRESTOR ) 20 MG tablet Take 1 tablet (20 mg total) by mouth every evening. 90 tablet 3   rosuvastatin  (CRESTOR ) 20 MG tablet Take 1 tablet by mouth daily.     Turmeric 500 MG CAPS Take 500 mg by mouth daily.     vitamin B-12 (CYANOCOBALAMIN ) 1000 MCG tablet Take 1 tablet (1,000 mcg total) by mouth daily.     buPROPion  (WELLBUTRIN  SR) 150 MG 12 hr tablet TAKE 1 TABLET BY MOUTH TWICE A DAY (Patient not taking: Reported on 06/26/2023) 180 tablet 2   buPROPion  (WELLBUTRIN  SR) 150 MG 12 hr tablet Take 150mg  once a day for 3 days, increase to 150mg  twice a day (Patient not taking: Reported on 06/26/2023) 60 tablet 0   nicotine  polacrilex (NICORETTE) 2 MG gum Take 2 mg by mouth as needed. (Patient not taking: Reported on 06/26/2023)     Zinc 30 MG TABS Take 30 mg by mouth. (Patient not taking: Reported on 06/26/2023)     No current facility-administered medications for this visit.    REVIEW OF SYSTEMS:   Review of Systems  Constitutional: Negative for appetite change, chills, fatigue, fever and unexpected weight change.  HENT: Negative for mouth sores, nosebleeds, sore throat and trouble swallowing.   Eyes: Negative for eye problems and icterus.  Respiratory: Positive for dyspnea on exertion and  chronic cough.  Negative for  hemoptysis and wheezing.   Cardiovascular: Negative for chest pain and leg swelling.  Gastrointestinal: Negative for abdominal pain, constipation, diarrhea, nausea and vomiting.  Genitourinary: Negative for bladder incontinence, difficulty urinating, dysuria, frequency and hematuria.   Musculoskeletal: Negative for back pain, gait problem, neck pain and neck stiffness.  Skin: Positive for telangiectasia on face and hands.   Neurological: Negative for dizziness, extremity weakness, gait problem, headaches, light-headedness and seizures.  Hematological: Negative for adenopathy. Does not bruise/bleed easily.  Psychiatric/Behavioral: Negative for confusion, depression and sleep disturbance. The patient is not nervous/anxious.     PHYSICAL EXAMINATION:  Blood pressure (!) 142/72, pulse 62, temperature 98 F (36.7 C), temperature source Temporal, resp. rate 13, weight 135 lb (61.2 kg), SpO2 100%.  ECOG PERFORMANCE STATUS: 1  Physical Exam  Constitutional: Oriented to person, place, and time and well-developed, well-nourished, and in no distress.  HENT:  Head: Normocephalic and atraumatic.  Mouth/Throat: Oropharynx is clear and moist. No oropharyngeal exudate.  Eyes: Conjunctivae are normal. Right eye exhibits no discharge. Left eye exhibits no discharge. No scleral icterus.  Neck: Normal range of motion. Neck supple.  Cardiovascular: Normal rate, regular rhythm, normal heart sounds and intact distal pulses.   Pulmonary/Chest: Effort normal. Mild crackles in left lower lung field. No respiratory distress. No wheezes. No rales.  Abdominal: Soft. Bowel sounds are normal. Exhibits no distension and no mass. There is no tenderness.  Musculoskeletal: Normal range of motion. Exhibits no edema.  Lymphadenopathy:    Right palpable subcutaneous lesion (previously imaged in 2024 and found to be calcified plaque)  Neurological: Alert and oriented to person, place, and time.  Exhibits normal muscle tone. Gait normal. Coordination normal.  Skin: Skin is  warm and dry. Positive for telangiectasia on face and palm of hands. No rash noted. Not diaphoretic. No erythema. No pallor.  Psychiatric: Mood, memory and judgment normal.  Vitals reviewed.  LABORATORY DATA: Lab Results  Component Value Date   WBC 7.5 06/26/2023   HGB 14.0 06/26/2023   HCT 43.1 06/26/2023   MCV 88.1 06/26/2023   PLT 187 06/26/2023      Chemistry      Component Value Date/Time   NA 131 (L) 06/26/2023 1253   NA 129 (L) 12/23/2022 1101   K 4.1 06/26/2023 1253   CL 94 (L) 06/26/2023 1253   CO2 32 06/26/2023 1253   BUN 16 06/26/2023 1253   BUN 13 12/23/2022 1101   CREATININE 0.64 06/26/2023 1253   GLU 71 09/24/2016 0000      Component Value Date/Time   CALCIUM  9.9 06/26/2023 1253   ALKPHOS 70 06/26/2023 1253   AST 36 06/26/2023 1253   ALT 29 06/26/2023 1253   BILITOT 0.5 06/26/2023 1253       RADIOGRAPHIC STUDIES: No results found.  ASSESSMENT: This is a very pleasant 73 year old female with recurrent adenocarcinoma. She was initially diagnosed with two synchronous primary lung malignancies in 2016. She underwent surgical resection. She had LLL non-small cell lung cancer, adenocarcinoma measuring 2.8 cm. She also had LUL mass s/p resection showing non-small cell lung cancer, sqmaous cell carcinoma measuring 2.9 cm. She underwent 4 cycles of chemotherapy with carboplatin and gemcitabine in 2016 under the care of Dr. Annell at [facility].   Molecular studies from 2016 show PDL1 <1%. She is positive for KRASG12C but is negative for other actionable mutations.   She had subpleural nodular consolidation along the suture line that was biopsied on 06/05/23 which showed recurrent adenocarcinoma.   She is here to establish care. She was seen with Dr. Sherrod today.  Dr. Sherrod had a lengthly discussion with the patient today about her current condition and treatment options.   As long  as her brain MRI and PET scan do not show any evidence of progressive disease, Dr. Sherrod discussed that treatment for the local recurrence would be either surgery versus radiation.  The patient is not interested in surgery at this time and is leaning towards proceeding with radiation she discussed with Dr. Dodie today.  She is going to cancel her imaging studies at [facility] so she can have those performed locally to avoid the long distance travel.  Either myself or radiation oncology will order her PET scan and brain MRI on Monday, 06/30/2023 to allow time for her to cancel her other scans.  She will keep her appointment with her surgeon next week at [facility] and have PFTs performed ensure she is getting their input.   We are happy to follow the patient here locally with routine imaging. We will see her in 3-4 months with a repeat CT scan of the chest. We will see her in the office 1 week later to review the results.   She will continue to follow with pulmonary medicine for her COPD.   The patient voices understanding of current disease status and treatment options and is in agreement with the current care plan.  All questions were answered. The patient knows to call the clinic with any problems, questions or concerns. We can certainly see the patient much sooner if necessary.  Thank you so much for allowing me to participate in the care of TRANA RESSLER. I will continue to follow up  the patient with you and assist in her care.   Disclaimer: This note was dictated with voice recognition software. Similar sounding words can inadvertently be transcribed and may not be corrected upon review.   Berthold Glace L Miabella Shannahan June 26, 2023, 2:18 PM  ADDENDUM: Hematology/Oncology Attending: I had a face-to-face encounter with the patient today.  I reviewed her records, lab, imaging studies and recommended her care plan.  This is a very pleasant 73 years old white female with multiple medical  problem including history of NSTEMI, right carotid artery stenosis, Takotsubo cardiomyopathy, mitral regurgitation, diastolic heart failure, COPD, hypothyroidism, dyslipidemia, TIA as well as history of 2 synchronous primary lung cancers diagnosed in August 2016 as stage IIA (T2b, N0, M0) non-small cell lung cancer, squamous cell carcinoma involving the left upper lobe as well as stage Ib (T2a, N0, M0) non-small cell lung cancer, adenocarcinoma involving the left lower lobe.  She is status post wedge resection of this 2 lesions from the left upper lobe as well as the left lower lobe by Dr. Medora at [facility].  She also received 4 cycle of adjuvant systemic chemotherapy by Dr. Valentino at [facility].  She has been on observation since that time.  Recently she was seen by her pulmonologist Dr. Geronimo for evaluation of her COPD and she had CT scan of the chest on 12/13/2022 that showed masslike soft tissue measuring 3.0 x 1.6 cm in the posterior left lower lobe encasing the wedge resection suture line and increased from the previous imaging studies on 01/18/2022 raising concern for recurrent malignancy.  A PET scan was performed on January 07, 2023 and that showed ill-defined airspace consolidation in the left lower lobe with mild hypermetabolism favoring post treatment changes or inflammatory/infectious process but close follow-up was suggested.  Repeat CT scan of the chest with contrast on April 23, 2023 showed subpleural nodular consolidation associated with the suture line in the posterior left lower lobe stable from December 13, 2022 but enlargement compared to 10/18/2020 with mild hypermetabolic activity on the previous PET scan suspicious for disease recurrence.  On June 05, 2023 the patient underwent CT-guided core needle biopsy of the left lower lobe lung mass by interventional radiology at [facility].  The final cytology showed atypical mucinous epithelium consistent with recurrent  well-differentiated mucinous adenocarcinoma.  Her previous tumor was tested for molecular studies and it was positive for KRAS G12C mutation. The patient has an appointment with Dr. Medora next week for evaluation of surgical resection but she is leaning more towards radiotherapy.  She was seen by Dr. Dewey earlier today. I had a lengthy discussion with the patient and her daughter today about her current condition and treatment options. I explained to the patient that surgical resection is a curative option but with her current COPD and previous wedge resection from the left upper lobe and left lower lobe she may not be a candidate for reresection and the alternative option will be curative radiotherapy with SBRT as recommended by Dr. Dewey. She will need completing staging workup with PET scan as well as MRI of the brain which has already been ordered at [facility]. I will see the patient back for follow-up visit in 4 months for evaluation with repeat CT scan of the chest for restaging of her disease. The patient and her daughter are in agreement with the current plan. She was advised to call immediately if she has any other concerning symptoms in the interval. The total time spent in  the appointment was 60 minutes. Disclaimer: This note was dictated with voice recognition software. Similar sounding words can inadvertently be transcribed and may be missed upon review. Sherrod MARLA Sherrod, MD

## 2023-06-25 ENCOUNTER — Other Ambulatory Visit: Payer: Self-pay | Admitting: *Deleted

## 2023-06-25 DIAGNOSIS — C3482 Malignant neoplasm of overlapping sites of left bronchus and lung: Secondary | ICD-10-CM

## 2023-06-25 NOTE — Progress Notes (Signed)
 Thoracic Location of Tumor / Histology: Recurrent Left Lung Cancer  Patient presented   Biopsies of Left Lung    Past/Anticipated interventions by cardiothoracic surgery, if any:  Dr. Medora- [facility] -Left Lower/Upper Lobe Lung Wedge Resection 2016   Past/Anticipated interventions by medical oncology, if any:  Cassie Heilingoetter / Dr. Sherrod 06/26/2023 1:00 pm    Tobacco/Marijuana/Snuff/ETOH use: Former Smoker.  Signs/Symptoms Weight changes, if any: She reports she has gained some weight over the past couple of months since she quit smoking. Respiratory complaints, if any: She reports SOB with increased activities or walking up stairs. Hemoptysis, if any: She reports occasional non-productive cough. Pain issues, if any: Denies chest pain, pressure, or tightness.    SAFETY ISSUES: Prior radiation? no Pacemaker/ICD?  No Possible current pregnancy? Postmenopausal Is the patient on methotrexate? No  Current Complaints / other details:

## 2023-06-26 ENCOUNTER — Ambulatory Visit (HOSPITAL_COMMUNITY): Payer: Medicare Other

## 2023-06-26 ENCOUNTER — Encounter: Payer: Self-pay | Admitting: Family Medicine

## 2023-06-26 ENCOUNTER — Inpatient Hospital Stay: Payer: Medicare Other | Admitting: Physician Assistant

## 2023-06-26 ENCOUNTER — Inpatient Hospital Stay: Payer: Medicare Other | Attending: Internal Medicine

## 2023-06-26 ENCOUNTER — Encounter: Payer: Self-pay | Admitting: Radiation Oncology

## 2023-06-26 ENCOUNTER — Ambulatory Visit
Admission: RE | Admit: 2023-06-26 | Discharge: 2023-06-26 | Disposition: A | Discharge status: Home / Self Care | Payer: Medicare Other | Source: Ambulatory Visit | Attending: Radiation Oncology | Admitting: Radiation Oncology

## 2023-06-26 ENCOUNTER — Telehealth: Payer: Self-pay | Admitting: Internal Medicine

## 2023-06-26 ENCOUNTER — Telehealth: Payer: Self-pay | Admitting: *Deleted

## 2023-06-26 VITALS — BP 142/72 | HR 62 | Temp 98.0°F | Resp 13 | Wt 135.0 lb

## 2023-06-26 VITALS — BP 134/73 | HR 72 | Temp 97.3°F | Resp 18 | Ht 66.5 in | Wt 135.4 lb

## 2023-06-26 DIAGNOSIS — Z79899 Other long term (current) drug therapy: Secondary | ICD-10-CM | POA: Diagnosis not present

## 2023-06-26 DIAGNOSIS — Z79890 Hormone replacement therapy (postmenopausal): Secondary | ICD-10-CM | POA: Diagnosis not present

## 2023-06-26 DIAGNOSIS — M341 CR(E)ST syndrome: Secondary | ICD-10-CM | POA: Insufficient documentation

## 2023-06-26 DIAGNOSIS — E039 Hypothyroidism, unspecified: Secondary | ICD-10-CM | POA: Insufficient documentation

## 2023-06-26 DIAGNOSIS — Z8042 Family history of malignant neoplasm of prostate: Secondary | ICD-10-CM | POA: Insufficient documentation

## 2023-06-26 DIAGNOSIS — Z85118 Personal history of other malignant neoplasm of bronchus and lung: Secondary | ICD-10-CM | POA: Insufficient documentation

## 2023-06-26 DIAGNOSIS — I7300 Raynaud's syndrome without gangrene: Secondary | ICD-10-CM | POA: Diagnosis not present

## 2023-06-26 DIAGNOSIS — Z8673 Personal history of transient ischemic attack (TIA), and cerebral infarction without residual deficits: Secondary | ICD-10-CM | POA: Diagnosis not present

## 2023-06-26 DIAGNOSIS — C3482 Malignant neoplasm of overlapping sites of left bronchus and lung: Secondary | ICD-10-CM

## 2023-06-26 DIAGNOSIS — Z87891 Personal history of nicotine dependence: Secondary | ICD-10-CM | POA: Insufficient documentation

## 2023-06-26 DIAGNOSIS — Z7902 Long term (current) use of antithrombotics/antiplatelets: Secondary | ICD-10-CM | POA: Insufficient documentation

## 2023-06-26 DIAGNOSIS — M349 Systemic sclerosis, unspecified: Secondary | ICD-10-CM | POA: Diagnosis not present

## 2023-06-26 DIAGNOSIS — R011 Cardiac murmur, unspecified: Secondary | ICD-10-CM | POA: Insufficient documentation

## 2023-06-26 DIAGNOSIS — I341 Nonrheumatic mitral (valve) prolapse: Secondary | ICD-10-CM | POA: Diagnosis not present

## 2023-06-26 DIAGNOSIS — I252 Old myocardial infarction: Secondary | ICD-10-CM | POA: Insufficient documentation

## 2023-06-26 DIAGNOSIS — I5032 Chronic diastolic (congestive) heart failure: Secondary | ICD-10-CM | POA: Diagnosis not present

## 2023-06-26 DIAGNOSIS — J8410 Pulmonary fibrosis, unspecified: Secondary | ICD-10-CM | POA: Diagnosis not present

## 2023-06-26 DIAGNOSIS — I340 Nonrheumatic mitral (valve) insufficiency: Secondary | ICD-10-CM | POA: Diagnosis not present

## 2023-06-26 DIAGNOSIS — J4489 Other specified chronic obstructive pulmonary disease: Secondary | ICD-10-CM | POA: Insufficient documentation

## 2023-06-26 DIAGNOSIS — I6521 Occlusion and stenosis of right carotid artery: Secondary | ICD-10-CM | POA: Insufficient documentation

## 2023-06-26 DIAGNOSIS — C801 Malignant (primary) neoplasm, unspecified: Secondary | ICD-10-CM

## 2023-06-26 DIAGNOSIS — I5181 Takotsubo syndrome: Secondary | ICD-10-CM | POA: Insufficient documentation

## 2023-06-26 DIAGNOSIS — E785 Hyperlipidemia, unspecified: Secondary | ICD-10-CM | POA: Insufficient documentation

## 2023-06-26 LAB — CMP (CANCER CENTER ONLY)
ALT: 29 U/L (ref 0–44)
AST: 36 U/L (ref 15–41)
Albumin: 4.6 g/dL (ref 3.5–5.0)
Alkaline Phosphatase: 70 U/L (ref 38–126)
Anion gap: 5 (ref 5–15)
BUN: 16 mg/dL (ref 8–23)
CO2: 32 mmol/L (ref 22–32)
Calcium: 9.9 mg/dL (ref 8.9–10.3)
Chloride: 94 mmol/L — ABNORMAL LOW (ref 98–111)
Creatinine: 0.64 mg/dL (ref 0.44–1.00)
GFR, Estimated: >60 mL/min (ref >60)
Glucose, Bld: 84 mg/dL (ref 70–99)
Potassium: 4.1 mmol/L (ref 3.5–5.1)
Sodium: 131 mmol/L — ABNORMAL LOW (ref 135–145)
Total Bilirubin: 0.5 mg/dL (ref 0.0–1.2)
Total Protein: 7.8 g/dL (ref 6.5–8.1)

## 2023-06-26 LAB — CBC WITH DIFFERENTIAL (CANCER CENTER ONLY)
Abs Immature Granulocytes: 0.06 10*3/uL (ref 0.00–0.07)
Basophils Absolute: 0 10*3/uL (ref 0.0–0.1)
Basophils Relative: 0 %
Eosinophils Absolute: 0 10*3/uL (ref 0.0–0.5)
Eosinophils Relative: 1 %
HCT: 43.1 % (ref 36.0–46.0)
Hemoglobin: 14 g/dL (ref 12.0–15.0)
Immature Granulocytes: 1 %
Lymphocytes Relative: 17 %
Lymphs Abs: 1.3 10*3/uL (ref 0.7–4.0)
MCH: 28.6 pg (ref 26.0–34.0)
MCHC: 32.5 g/dL (ref 30.0–36.0)
MCV: 88.1 fL (ref 80.0–100.0)
Monocytes Absolute: 0.4 10*3/uL (ref 0.1–1.0)
Monocytes Relative: 6 %
Neutro Abs: 5.7 10*3/uL (ref 1.7–7.7)
Neutrophils Relative %: 75 %
Platelet Count: 187 10*3/uL (ref 150–400)
RBC: 4.89 MIL/uL (ref 3.87–5.11)
RDW: 12.8 % (ref 11.5–15.5)
WBC Count: 7.5 10*3/uL (ref 4.0–10.5)
nRBC: 0 % (ref 0.0–0.2)

## 2023-06-26 NOTE — Telephone Encounter (Signed)
 Dr Marguerita Shih for oncology and DRs Moody/Kinard/Manning for rad onc

## 2023-06-26 NOTE — Progress Notes (Signed)
 Radiation Oncology         [phone] ________________________________  Name: Joy Williams        MRN: [id number]  Date of Service: 06/26/2023 DOB: 01-Apr-1951  RR:Zidumnf, Joesph LABOR, PA  Geronimo Amel, MD     REFERRING PHYSICIAN: Geronimo Amel, MD   DIAGNOSIS: The encounter diagnosis was Malignant neoplasm of overlapping sites of left lung [facility]).   HISTORY OF PRESENT ILLNESS: Joy Williams is a 73 y.o. female seen at the request of Dr. Geronimo for a diagnosis of locally recurrent lung cancer.  The patient has aHistory of synchronous primary cancers in the left lung involving the left lower lobe and left upper lobe.  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  She received adjuvant chemotherapy with Dr. Annell and has been undergoing surveillance scans.  She has had pulmonary fibrosis, CREST syndrome and scleroderma clinically diagnosed however has had multiple workups that have not confirmed this with serum testing.  She does not take immunomodulative therapy.  Imaging with pulmonary in [place] on 12/13/2022 started to show concerns for an increase in masslike soft tissue along the posterior left lower lobe encasing a wedge resection suture line this had increased from scans in September 2023 where that area measured 1.6 cm.  No additional significant nodules were appreciated and postsurgical changes were also noted in the upper lobe of the left lung.  She proceeded with PET scan on 01/07/2023, there was hypermetabolic activity posterior to her ears bilaterally and anterior to the maxillary sinuses.  She also had airspace consolidation in the left lower lobe with mild hypermetabolic activity with the SUV being 3.46.  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  She is seen today to discuss treatment options locally in the chest.  She will also be seeing her surgeon Dr. Medora at [facility] on 07/03/23. She has an MRI scheduled of the brain on 07/23/23.     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 [facility] (had to have botox injection)   COPD (chronic obstructive pulmonary disease) (HCC)    Family history of adverse reaction to anesthesia    Mother- very sensitive to medication   Heart murmur    MVP   Hypothyroidism    Lung cancer (HCC) 12/30/2014   Synchronous primaries:  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, each treated by wedge resection with post-op adjuvant chemoRx at [facility]   MVP (mitral valve prolapse) 2018   s/p Minimally-Invasive Mitral Valve Replacement w/  [facility] Mitral bovine bioprosthetic tissue valve (size 31mm, model # 7300TFX, serial # [id number])   PONV (postoperative nausea and  vomiting)    Raynaud's syndrome    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   31 mm [facility] mitral bovine bioprosthetic tissue valve placed via right mini thoracotomy approach   Severe mitral regurgitation 11/15/2014   Shortness of breath dyspnea    with exertion   STD (sexually transmitted disease)    STEMI (ST elevation myocardial infarction) (HCC) 10/24/2020   Telangiectasia        PAST SURGICAL  HISTORY: Past Surgical History:  Procedure Laterality Date   BACK SURGERY     x 3  Disectomy   BREAST BIOPSY Left    CARDIAC CATHETERIZATION N/A 03/08/2016   Procedure: Right/Left Heart Cath and Coronary Angiography;  Surgeon: Ozell Fell, MD;  Location: [facility] INVASIVE CV LAB;  Service: Cardiovascular;  Laterality: N/A;   CLAVICLE SURGERY Left 2013   plate to left collar bone   ENDARTERECTOMY Right 08/09/2020   Procedure: RIGHT CAROTID ENDARTERECTOMY;  Surgeon: Sheree Penne Bruckner, MD;  Location: [facility] OR;  Service: Vascular;  Laterality: Right;   LAPAROSCOPY     ? reason-age 4    LEFT HEART CATH AND CORONARY ANGIOGRAPHY N/A 02/25/2019   Procedure: LEFT HEART CATH AND CORONARY ANGIOGRAPHY;  Surgeon: Dann Candyce RAMAN, MD;  Location: [facility] INVASIVE CV LAB;  Service: Cardiovascular;  Laterality: N/A;   LEFT HEART CATH AND CORONARY ANGIOGRAPHY N/A 10/24/2020   Procedure: LEFT HEART CATH AND CORONARY ANGIOGRAPHY;  Surgeon: Dann Candyce RAMAN, MD;  Location: [facility] INVASIVE CV LAB;  Service: Cardiovascular;  Laterality: N/A;   LUNG CANCER SURGERY     MITRAL VALVE REPAIR Right 06/27/2016   Procedure: MINIMALLY INVASIVE MITRAL VALVE REPLACEMENT;  Surgeon: Sudie VEAR Laine, MD;  Location: [facility] OR;  Service: Open Heart Surgery;  Laterality: Right;   TEE WITHOUT CARDIOVERSION N/A 02/22/2016   Procedure: TRANSESOPHAGEAL ECHOCARDIOGRAM (TEE);  Surgeon: Leim VEAR Moose, MD;  Location: [facility] ENDOSCOPY;  Service: Cardiovascular;  Laterality: N/A;   TEE WITHOUT CARDIOVERSION N/A 06/27/2016   Procedure: TRANSESOPHAGEAL ECHOCARDIOGRAM (TEE);  Surgeon: Sudie VEAR Laine, MD;  Location: [facility] OR;  Service: Open Heart Surgery;  Laterality: N/A;   TONSILLECTOMY     VIDEO ASSISTED THORACOSCOPY (VATS)/WEDGE RESECTION Left 12/30/2014   Bronchoscopy, Mediastinoscopy, Left VATS for Wedge resection LUL x2 adn LLL x1 - Dr. Medora at [facility]   VOCAL CORD INJECTION Left 2017   injected with botox   wrist surgery Left 2015   plate to  wrist      FAMILY HISTORY:  Family History  Problem Relation Age of Onset   Mitral valve prolapse Mother    Dementia Father    Prostate cancer Father    Mitral valve prolapse Sister    Mitral valve prolapse Brother    Healthy Son    Healthy Daughter    Colon cancer Neg Hx    Colon polyps Neg Hx    Rectal cancer Neg Hx    Esophageal cancer Neg Hx    Stomach cancer Neg Hx    Heart attack Neg Hx      SOCIAL HISTORY:  reports that she has quit smoking. Her smoking use included cigarettes. She started smoking about 40 years ago. She has a 40.1 pack-year smoking history. She has never been exposed to tobacco smoke. She has never used smokeless tobacco. She reports current alcohol use of about 2.0 standard drinks of alcohol per week. She reports that she does not use drugs.   ALLERGIES: Penicillins, Bee venom, and Ofev  [nintedanib]  MEDICATIONS:  Current Outpatient Medications  Medication Sig Dispense Refill   acetaminophen  (TYLENOL ) 500 MG tablet Take 1,000 mg by mouth every 6 (six) hours as needed for mild pain or headache.     buPROPion  (WELLBUTRIN  SR) 150 MG 12 hr tablet TAKE 1 TABLET BY MOUTH TWICE A DAY 180 tablet 2   buPROPion  (WELLBUTRIN  SR) 150 MG 12 hr tablet Take 150mg  once a day for 3 days, increase to 150mg  twice a day 60 tablet 0   Cholecalciferol (VITAMIN D ) 125 MCG (5000 UT) CAPS Take 5,000 Units by mouth daily.     clopidogrel  (PLAVIX ) 75 MG tablet Take 1 tablet (75 mg total) by mouth daily. 90 tablet 2   EPINEPHrine  (EPIPEN  2-PAK) 0.3 mg/0.3 mL IJ SOAJ injection Inject 0.3 mg into the muscle as needed for anaphylaxis. 1 each 1   EPINEPHrine  0.3 mg/0.3 mL IJ SOAJ injection Inject 0.3 mg into the muscle as needed.     ferrous sulfate  325 (65 FE) MG tablet Take 1 tablet (325 mg total) by mouth daily. 90 tablet 3   Fexofenadine HCl (MUCINEX  ALLERGY PO) Take 1 tablet by mouth daily.     fluticasone  (FLONASE ) 50 MCG/ACT nasal spray Place 1 spray into both nostrils  daily.     Fluticasone -Umeclidin-Vilant (TRELEGY ELLIPTA ) 100-62.5-25 MCG/ACT AEPB INHALE 1 PUFF BY MOUTH EVERY DAY 60 each 5   hydrALAZINE  (APRESOLINE ) 25 MG tablet Take 1 tablet (25 mg total) by mouth daily as needed (for systolic BP>150). 30 tablet 9   levothyroxine  (SYNTHROID ) 150 MCG tablet Take 1 tablet (150 mcg total) by mouth daily. 90 tablet 3   MELATONIN PO Take 1 tablet by mouth at bedtime as needed (sleep).     Multiple Vitamins-Minerals (MULTIVITAMIN WITH MINERALS) tablet Take 1 tablet by mouth daily.     nicotine  polacrilex (NICORETTE) 2 MG gum Take 2 mg by mouth as needed.     nitroGLYCERIN  (NITROSTAT ) 0.4 MG SL tablet Place 0.4 mg under the tongue every 5 (five) minutes as needed.     rosuvastatin  (CRESTOR ) 20 MG tablet Take 1 tablet (20 mg total) by mouth every evening. 90 tablet 3   rosuvastatin  (CRESTOR ) 20 MG tablet Take 1 tablet by mouth daily.     Turmeric 500 MG CAPS Take 500 mg by mouth daily.     vitamin B-12 (CYANOCOBALAMIN ) 1000 MCG tablet Take 1 tablet (1,000 mcg total) by mouth daily.     Zinc 30 MG TABS Take 30 mg by mouth.     No current facility-administered medications for this encounter.     REVIEW OF SYSTEMS: On review of systems, the patient reports that she enjoys being physically active walking between 4-6K steps a day and doing water-based exercise at an indoor pool.  She does get short of breath with exertion but this does not seem to keep her from activities that she enjoys.  She has gained a few pounds in the last few months since she quit smoking and has an occasional nonproductive cough.  No other complaints are verbalized.    PHYSICAL EXAM:  Wt Readings from Last 3 Encounters:  06/26/23 135 lb 6 oz (61.4 kg)  03/04/23 128 lb 9.6 oz (58.3 kg)  01/27/23 130 lb (59 kg)   Temp Readings from Last 3 Encounters:  06/26/23 (!) 97.3 F (36.3 C) (Temporal)  03/04/23 98.3 F (36.8 C) (Temporal)  03/18/22 98 F (36.7 C) (Oral)   BP Readings from  Last 3 Encounters:  06/26/23 134/73  03/04/23 120/62  01/27/23 97/63   Pulse Readings from Last 3 Encounters:  06/26/23 72  03/04/23 74  01/27/23 74   Pain Assessment Pain Score: 0-No pain/10  In general this is a well appearing caucasian female in no acute distress. She's alert and oriented x4 and appropriate throughout the examination. Cardiopulmonary assessment is negative for acute distress and she exhibits normal effort.     ECOG = 1  0 - Asymptomatic (Fully active, able to carry on all predisease activities without restriction)  1 - Symptomatic but completely ambulatory (Restricted in physically strenuous activity but ambulatory and able to carry out work of a light or sedentary nature. For example, light housework, office work)  2 - Symptomatic, <50% in bed during the day (Ambulatory and capable of all self care but unable to carry out any work activities. Up and about more than 50% of waking hours)  3 - Symptomatic, >50% in bed, but not bedbound (Capable of only limited self-care, confined to bed or chair 50% or more of waking hours)  4 - Bedbound (Completely disabled. Cannot carry on any self-care. Totally confined to bed or chair)  5 - Death   Raylene MM, Creech RH, Tormey DC, et al. [phone]). Toxicity and response criteria of the [facility]. Am. DOROTHA Bridges. Oncol. 5 (6): 649-55    LABORATORY DATA:  Lab Results  Component Value Date   WBC 8.4 12/23/2022   HGB 12.9 12/23/2022   HCT 40.4 12/23/2022   MCV 86 12/23/2022   PLT 216 12/23/2022   Lab Results  Component Value Date   NA 129 (L) 12/23/2022   K 4.7 12/23/2022   CL 90 (L) 12/23/2022   CO2 24 12/23/2022   Lab Results  Component Value Date   ALT 20 12/23/2022   AST 31 12/23/2022   ALKPHOS 71 12/23/2022   BILITOT 0.4 12/23/2022      RADIOGRAPHY: No results found.     IMPRESSION/PLAN: 1. Locally Recurrent Stage I, pT2aN0M0 Mucinous Adenocarcinoma of the LLL. Dr. Dewey  discusses the pathology findings and reviews the nature of locally recurrent lung cancer. Dr. Dewey recommends restaging imaging with CT scan of the C/A/P to rule out any further concerns for disease. She is also due for an MRI brain, but would like to cancel this at [facility]. Once it's cancelled we will order this as well in the [facility] system. Dr. Dewey reviews that the standard of care for early stage lung cancer is for surgical resection. However for patients who are not medical candidates to undergo surgery, or who choose to forgo surgery, stereotactic body radiotherapy (SBRT) is an appropriate alternative. Her situation is not quite the same as though this was just diagnosed, rather, it appears that since her other imaging to date does not indicate a concern for metastatic recurrence, that local therapies including surgery or SBRT could be considered. The patient is hopeful to avoid further surgery given her lung capacity at baseline is already diminished from prior surgery and pulmonary fibrosis. We discussed the risks, benefits, short, and long term effects of radiotherapy, as well as the curative intent, and the patient is interested in proceeding. She is also specifically aware of the concerns related to her pulmonary fibrosis and connective tissue disorders which could pose more brisk side effects during therapy and long term fibrotic changes following treatment. Dr. Dewey discusses the delivery and logistics of radiotherapy and anticipates a course of 5 fractions of SBRT which she is motivated to  proceed with. She still would like to see Dr. Medora at [facility] to formally meet. We will follow up with the results of her CT scan and MRI brain, and proceed with SBRT provided there is no evidence of further disease outside of the left lung base. The patient will be contacted to coordinate treatment planning by our simulation department. She will also see Dr. Sherrod as well today and will establish for future  surveillance.  2. 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  3. History of CREST Syndrome/Scleroderma. While the patient has gone to great lengths to try to formally document her connective tissue disorders, it is clear she has clinical symptoms but her lab work has also been in consistent with the differential diagnosis. She is not taking any immunomodulating therapy at this time and is aware we'd recommend avoiding methotrexate which thankfully she does not take. She is aware that the risks of local fibrosis and skin reaction from radiation may be more prominent given these conditions, and she was encouraged to report her symptoms to us  during therapy and fibrosis of the lower lung as well is a long term risk she may have a higher probability of given her connective tissue disorder. 4. History of pulmonary fibrosis. As per #3, she is aware of the long term risks of radiotherapy, however the location of her tumor is more favorable, given the already existing fibrotic dense changes of her lung in that area.   In a visit lasting 75 minutes, greater than 50% of the time was spent face to face discussing the patient's condition, in preparation for the discussion, and coordinating the patient's care.   The above documentation reflects my direct findings during this shared patient visit. Please see the separate note by Dr. Dewey on this date for the remainder of the patient's plan of care.    Donald KYM Husband, [facility]   **Disclaimer: This note was dictated with voice recognition software. Similar sounding words can inadvertently be transcribed and this note may contain transcription errors which may not have been corrected upon publication of note.**

## 2023-06-26 NOTE — Telephone Encounter (Signed)
 Called patient to inform of CT for 06-27-23- arrival time- 4:45 pm @ [facility], no restrictions to scan, spoke with patient and she is aware of this scan and the instructions

## 2023-06-27 ENCOUNTER — Ambulatory Visit (HOSPITAL_COMMUNITY)
Admission: RE | Admit: 2023-06-27 | Discharge: 2023-06-27 | Disposition: A | Discharge status: Home / Self Care | Payer: Medicare Other | Source: Ambulatory Visit | Attending: Radiation Oncology | Admitting: Radiation Oncology

## 2023-06-27 ENCOUNTER — Other Ambulatory Visit: Payer: Self-pay | Admitting: Radiation Oncology

## 2023-06-27 DIAGNOSIS — C3482 Malignant neoplasm of overlapping sites of left bronchus and lung: Secondary | ICD-10-CM

## 2023-06-27 DIAGNOSIS — K449 Diaphragmatic hernia without obstruction or gangrene: Secondary | ICD-10-CM | POA: Diagnosis not present

## 2023-06-27 DIAGNOSIS — J8410 Pulmonary fibrosis, unspecified: Secondary | ICD-10-CM | POA: Diagnosis not present

## 2023-06-27 DIAGNOSIS — F17209 Nicotine dependence, unspecified, with unspecified nicotine-induced disorders: Secondary | ICD-10-CM | POA: Diagnosis not present

## 2023-06-27 DIAGNOSIS — C3432 Malignant neoplasm of lower lobe, left bronchus or lung: Secondary | ICD-10-CM | POA: Diagnosis not present

## 2023-06-27 MED ORDER — IOHEXOL 300 MG/ML  SOLN
100.0000 mL | Freq: Once | INTRAMUSCULAR | Status: AC | PRN
Start: 2023-06-27 — End: 2023-06-27
  Administered 2023-06-27: 100 mL via INTRAVENOUS

## 2023-06-27 NOTE — Telephone Encounter (Signed)
 I called and spoke with pt. I informed pt that [facility] sent referrals for Oncology and Radiation oncology and someone would call to schedule these. Pt verbalized understanding. NFN

## 2023-06-28 ENCOUNTER — Other Ambulatory Visit: Payer: Self-pay | Admitting: Radiation Oncology

## 2023-06-28 DIAGNOSIS — C3490 Malignant neoplasm of unspecified part of unspecified bronchus or lung: Secondary | ICD-10-CM

## 2023-06-30 NOTE — Telephone Encounter (Signed)
 Routing to MR as [employer]

## 2023-07-01 ENCOUNTER — Telehealth: Payer: Self-pay | Admitting: Internal Medicine

## 2023-07-01 ENCOUNTER — Ambulatory Visit (HOSPITAL_COMMUNITY): Payer: Medicare Other

## 2023-07-01 NOTE — Telephone Encounter (Signed)
Apreciate it

## 2023-07-02 ENCOUNTER — Ambulatory Visit (HOSPITAL_COMMUNITY)
Admission: RE | Admit: 2023-07-02 | Discharge: 2023-07-02 | Disposition: A | Discharge status: Home / Self Care | Payer: Medicare Other | Source: Ambulatory Visit | Attending: Radiation Oncology | Admitting: Radiation Oncology

## 2023-07-02 ENCOUNTER — Encounter: Payer: Medicare Other | Admitting: Internal Medicine

## 2023-07-02 DIAGNOSIS — Z006 Encounter for examination for normal comparison and control in clinical research program: Secondary | ICD-10-CM

## 2023-07-02 DIAGNOSIS — J849 Interstitial pulmonary disease, unspecified: Secondary | ICD-10-CM

## 2023-07-02 DIAGNOSIS — C3490 Malignant neoplasm of unspecified part of unspecified bronchus or lung: Secondary | ICD-10-CM | POA: Insufficient documentation

## 2023-07-02 DIAGNOSIS — I6782 Cerebral ischemia: Secondary | ICD-10-CM | POA: Diagnosis not present

## 2023-07-02 MED ORDER — GADOBUTROL 1 MMOL/ML IV SOLN
6.0000 mL | Freq: Once | INTRAVENOUS | Status: AC | PRN
  Administered 2023-07-02: 6 mL via INTRAVENOUS

## 2023-07-02 NOTE — Research (Signed)
 Title: Chronic Fibrosing Interstitial Lung Disease with Progressive Phenotype Prospective Outcomes (ILD-PRO) Registry    Protocol #: IPF-PRO-SUB, Clinical Trials # [id number], Sponsor: [facility] [employer]   Protocol Version Amendment 6 dated 30Apr2024  and confirmed current on 10Sep2024 Consent Version for today's visit date of  Is [employer] IRB Approved Version 22May2024 Revised 29Jul2024   Objectives:  Describe current approaches to diagnosis and treatment of chronic fibrosing ILDs with progressive phenotype  Describe the natural history of chronic fibrosing ILDs with progressive phenotype  Assess quality of life from self-administered participant reported questionnaires for each disease group  Describe participant interactions with the healthcare system, describe treatment practices across multiple institutions for each disease group  Collect biological samples linked to well characterized chronic fibrosing ILDs with progressive phenotype to identify disease biomarkers  Collect data and biological samples that will support future research studies.                                            Key Inclusion Criteria: Willing and able to provide informed consent  Age >= 30 years  Diagnosis of a non-IPF ILD of any duration, including, but not limited to Idiopathic Non-Specific Interstitial Pneumonia (INSIP), Unclassifiable Idiopathic Interstitial Pneumonias (IIPs), Interstitial Pneumonia with Autoimmune Features (IPAF), Autoimmune ILDs such as Rheumatoid Arthritis (RA-ILD) and Systemic Sclerosis (SSC-ILD), Chronic Hypersensitivity Pneumonitis (HP), Sarcoidosis or Exposure-related ILDs such as asbestosis.  Chronic fibrosing ILD defined by reticular abnormality with traction bronchiectasis with or without honeycombing confirmed by chest HRCT scan and/or lung biopsy.  Progressive phenotype as defined by fulfilling at least one of the criteria below of fibrotic changes (progression set  point) within the last 24 months regardless of treatment considered appropriate in individual ILDs:  decline in FVC % predicted (% pred) based on >10% relative decline  decline in FVC % pred based on >= 5 - <10% relative decline in FVC combined with worsening of respiratory symptoms as assessed by the site investigator  decline in FVC % pred based on >= 5 - <10% relative decline in FVC combined with increasing extent of fibrotic changes on chest imaging (HRCT scan) as assessed by the site investigator  decline in DLCO % pred based on >= 10% relative decline  worsening of respiratory symptoms as well as increasing extent of fibrotic changes on chest imaging (HRCT scan) as assessed by the site investigator independent of FVC change.     Key Exclusion Criteria: Malignancy, treated or untreated, other than skin or early stage prostate cancer, within the past 5 years  Currently listed for lung transplantation at the time of enrollment  Currently enrolled in a clinical trial at the time of enrollment in this registry    Clinical Research Coordinator / Research RN note : This visit for  Subject [id number] with DOB: 1950/09/23 on 12Feb2025 for the above protocol is Visit/Encounter 2 and is for purpose of research.    Subject expressed continued interest and consent in continuing as a study subject. Subject confirmed that there was no change in contact information (e.g. address, telephone, email). Subject thanked for participation in research and contribution to science.     During this visit on, the subject completed the blood work and questionnaires per the above referenced protocol. Please refer to the subject's paper source binder for further details.   Signed by Despina Hick  Clinical Research Coordinator [facility]  [place], [place]

## 2023-07-03 ENCOUNTER — Telehealth: Payer: Self-pay | Admitting: Medical Oncology

## 2023-07-03 ENCOUNTER — Encounter: Payer: Self-pay | Admitting: Internal Medicine

## 2023-07-03 ENCOUNTER — Ambulatory Visit (HOSPITAL_COMMUNITY): Payer: Medicare Other

## 2023-07-03 DIAGNOSIS — C3482 Malignant neoplasm of overlapping sites of left bronchus and lung: Secondary | ICD-10-CM | POA: Diagnosis not present

## 2023-07-03 DIAGNOSIS — J849 Interstitial pulmonary disease, unspecified: Secondary | ICD-10-CM | POA: Diagnosis not present

## 2023-07-03 DIAGNOSIS — Z87891 Personal history of nicotine dependence: Secondary | ICD-10-CM | POA: Diagnosis not present

## 2023-07-03 DIAGNOSIS — R0602 Shortness of breath: Secondary | ICD-10-CM | POA: Diagnosis not present

## 2023-07-03 NOTE — Telephone Encounter (Addendum)
Pt decided to to proceed with thoracic surgery w Dr. Ralene Cork . She cancelled her xrt appts with [facility] @ Dr. Mitzi Hansen.   Joy Williams will call back after her surgery for an appt.with  Dr Arbutus Ped.

## 2023-07-03 NOTE — Telephone Encounter (Signed)
FYI

## 2023-07-04 ENCOUNTER — Ambulatory Visit: Payer: Medicare Other | Admitting: Radiation Oncology

## 2023-07-04 ENCOUNTER — Other Ambulatory Visit: Payer: Self-pay | Admitting: Physician Assistant

## 2023-07-04 DIAGNOSIS — C3482 Malignant neoplasm of overlapping sites of left bronchus and lung: Secondary | ICD-10-CM

## 2023-07-06 ENCOUNTER — Encounter: Payer: Self-pay | Admitting: Radiation Oncology

## 2023-07-07 ENCOUNTER — Encounter (HOSPITAL_COMMUNITY)
Admission: RE | Admit: 2023-07-07 | Discharge: 2023-07-07 | Disposition: A | Discharge status: Home / Self Care | Payer: Medicare Other | Source: Ambulatory Visit | Attending: Internal Medicine | Admitting: Internal Medicine

## 2023-07-07 ENCOUNTER — Telehealth: Payer: Self-pay | Admitting: Internal Medicine

## 2023-07-07 ENCOUNTER — Encounter (HOSPITAL_COMMUNITY): Payer: Self-pay

## 2023-07-07 VITALS — BP 146/82 | HR 67

## 2023-07-07 DIAGNOSIS — J449 Chronic obstructive pulmonary disease, unspecified: Secondary | ICD-10-CM | POA: Diagnosis not present

## 2023-07-07 NOTE — Telephone Encounter (Signed)
Patient had a PFT done in [facility] so the one scheduled with [place] is no longer needed. She would also like to know if she should still keep her appointment with [facility] since she has lung surgery with [facility] on the 14th of March. Please advise [phone].

## 2023-07-07 NOTE — Progress Notes (Signed)
Joy Williams 73 y.o. female Pulmonary Rehab Orientation Note This patient who was referred to Pulmonary Rehab by Dr. Ewing Schlein with the diagnosis of COPD 1 arrived today in Cardiac and Pulmonary Rehab. She  arrived ambulatory with normal gait. She  does not carry portable oxygen.   Color good, skin warm and dry. Patient is oriented to time and place. Patient's medical history, psychosocial health, and medications reviewed. Psychosocial assessment reveals patient lives with alone. Joy Williams is currently retired. Patient hobbies include  walking her dog . Patient reports her stress level is low. Areas of stress/anxiety include health. Patient does not exhibit signs of depression. Signs of depression include PHQ2/9 score 0/1. Joy Williams shows good  coping skills with positive outlook on life. Offered emotional support and reassurance. Will continue to monitor and evaluate progress toward psychosocial goal(s) of stress relief with exercise.   Physical assessment performed by Karlene Lineman RN. Please see their orientation physical assessment note. Joy Williams reports she does take medications as prescribed.   Patient states she follows a regular  diet. The patient reports no specific efforts to gain or lose weight. However she recently quit smoking therefore is eating more. Patient's weight will be monitored closely.   Demonstration and practice of PLB using pulse oximeter. Joy Williams able to return demonstration satisfactorily. Safety and hand hygiene in the exercise area reviewed with patient. Joy Williams voices understanding of the information reviewed. Department expectations discussed with patient and achievable goals were set. The patient shows enthusiasm about attending the program and we look forward to working with Joy Williams. Joy Williams completed a 6 min walk test today and is scheduled to begin exercise on 07/08/23 at 1 pm.   [id number] Joy Williams, BS, ACSM-CEP

## 2023-07-07 NOTE — Progress Notes (Signed)
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)  Patient's Home Medications on Admission:   Current Outpatient Medications:    acetaminophen (TYLENOL) 500 MG tablet, Take 1,000 mg by mouth every 6 (six) hours as needed for mild pain or headache., Disp: , Rfl:    Cholecalciferol (VITAMIN D) 125 MCG (5000 UT) CAPS, Take 5,000 Units by mouth daily., Disp: , Rfl:    clopidogrel (PLAVIX) 75 MG tablet, Take 1 tablet (75 mg total) by mouth daily., Disp: 90 tablet, Rfl: 2   EPINEPHrine (EPIPEN 2-PAK) 0.3 mg/0.3 mL IJ SOAJ injection, Inject 0.3 mg into the muscle as needed for anaphylaxis., Disp: 1 each, Rfl: 1   EPINEPHrine 0.3 mg/0.3 mL IJ SOAJ injection, Inject 0.3 mg into the muscle as needed., Disp: , Rfl:    ferrous sulfate 325 (65 FE) MG tablet, Take 1 tablet (325 mg total) by mouth daily. (Patient taking differently: Take 325 mg by mouth daily. OTC), Disp: 90 tablet, Rfl: 3   Fexofenadine HCl (MUCINEX ALLERGY PO), Take 1 tablet by mouth daily., Disp: , Rfl:    Fluticasone-Umeclidin-Vilant (TRELEGY ELLIPTA) 100-62.5-25 MCG/ACT AEPB, INHALE 1 PUFF BY MOUTH EVERY DAY, Disp: 60 each, Rfl: 5   hydrALAZINE (APRESOLINE) 25 MG tablet, Take 1 tablet (25 mg total) by mouth daily as needed (for systolic BP>150)., Disp: 30 tablet, Rfl: 9   levothyroxine (SYNTHROID) 150 MCG tablet, Take 1 tablet (150 mcg total) by mouth daily., Disp: 90 tablet, Rfl: 3   MELATONIN PO, Take 1 tablet by mouth at bedtime as needed  (sleep)., Disp: , Rfl:    Multiple Vitamins-Minerals (MULTIVITAMIN WITH MINERALS) tablet, Take 1 tablet by mouth daily., Disp: , Rfl:    nicotine polacrilex (NICORETTE) 2 MG gum, Take 2 mg by mouth as needed., Disp: , Rfl:    rosuvastatin (CRESTOR) 20 MG tablet, Take 1 tablet by mouth daily., Disp: , Rfl:    Turmeric 500 MG CAPS, Take 500 mg by mouth daily., Disp: , Rfl:    vitamin B-12 (CYANOCOBALAMIN) 1000 MCG tablet, Take 1 tablet (1,000 mcg total) by mouth daily., Disp: , Rfl:    buPROPion (WELLBUTRIN SR) 150 MG 12 hr tablet, TAKE 1 TABLET BY MOUTH TWICE A DAY (Patient not taking: Reported on 07/07/2023), Disp: 180 tablet, Rfl: 2   buPROPion (WELLBUTRIN SR) 150 MG 12 hr tablet, Take 150mg  once a day for 3 days, increase to 150mg  twice a day (Patient not taking: Reported on 07/07/2023), Disp: 60 tablet, Rfl: 0   fluticasone (FLONASE) 50 MCG/ACT nasal spray, Place 1 spray into both nostrils daily. (Patient not taking: Reported on 07/07/2023), Disp: , Rfl:    nitroGLYCERIN (NITROSTAT) 0.4 MG SL tablet, Place 0.4 mg under the tongue every 5 (five) minutes as needed. (Patient not taking: Reported on 07/07/2023), Disp: , Rfl:    rosuvastatin (CRESTOR) 20 MG tablet, Take 1 tablet (20 mg total) by mouth every evening. (Patient not taking: Reported on 07/07/2023), Disp: 90 tablet, Rfl: 3   Zinc 30  MG TABS, Take 30 mg by mouth. 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 [facility] (had to have botox injection)   COPD (chronic obstructive pulmonary disease) (HCC)    Family history of adverse reaction to anesthesia    Mother- very sensitive to medication    Heart murmur    MVP   Hypothyroidism    Lung cancer (HCC) 12/30/2014   Synchronous primaries:  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, each treated by wedge resection with post-op adjuvant chemoRx at [facility]   MVP (mitral valve prolapse) 2018   s/p Minimally-Invasive Mitral Valve Replacement w/  [facility] Mitral bovine bioprosthetic tissue valve (size 31mm, model # 7300TFX, serial # [id number])   PONV (postoperative nausea and vomiting)    Raynaud's syndrome    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   31 mm Edwards Magna mitral bovine bioprosthetic tissue valve placed via right mini thoracotomy approach   Severe mitral regurgitation 11/15/2014   Shortness of breath dyspnea    with exertion   STD (sexually transmitted disease)    STEMI (ST elevation myocardial infarction) (HCC) 10/24/2020   Telangiectasia     Tobacco Use: Social History   Tobacco Use  Smoking Status Former   Current packs/day: 0.00   Average packs/day: 1 pack/day for 40.1 years (40.1 ttl pk-yrs)   Types: Cigarettes   Start date: 05/21/1983   Quit date: 06/12/2023   Years since quitting: 0.0   Passive exposure: Never  Smokeless Tobacco Never  Tobacco Comments   Quit 06/12/23    Labs: Review Flowsheet  More data exists      Latest Ref Rng & Units 06/15/2020 08/07/2020 10/24/2020 05/30/2022 08/28/2022  Labs for ITP Cardiac and Pulmonary Rehab  Cholestrol 100 - 199 mg/dL - 660  630  160  109   LDL (calc) 0 - 99 mg/dL - 50  55  54  55   Direct LDL 0 - 99 mg/dL 63  - - - -  HDL-C >32 mg/dL - 46  60  66  67   Trlycerides 0 - 149 mg/dL - 355  732  98  202   Hemoglobin A1c 4.8 - 5.6 % - 5.6  5.5  - -  TCO2 22 - 32 mmol/L - - 24  - -    Capillary Blood Glucose: Lab Results  Component Value Date   GLUCAP 92 01/07/2023   GLUCAP 94 11/03/2020   GLUCAP 85 11/03/2020   GLUCAP 81 03/11/2018   GLUCAP 86 07/03/2016     Pulmonary Assessment Scores:  Pulmonary  Assessment Scores     Row Name 07/07/23 1021         ADL UCSD   ADL Phase Entry     SOB Score total 6       CAT Score   CAT Score 11       mMRC Score   mMRC Score 2             UCSD: Self-administered rating of dyspnea associated with  activities of daily living (ADLs) 6-point scale (0 = "not at all" to 5 = "maximal or unable to do because of breathlessness")  Scoring Scores range from 0 to 120.  Minimally important difference is 5 units  CAT: CAT can identify the health impairment of COPD patients and is better correlated with disease progression.  CAT has a scoring range of zero to 40. The CAT score is classified into four groups of low (less than 10), medium (10 - 20), high (21-30) and very high (31-40) based on the impact level of disease on health status. A CAT score over 10 suggests significant symptoms.  A worsening CAT score could be explained by an exacerbation, poor medication adherence, poor inhaler technique, or progression of COPD or comorbid conditions.  CAT MCID is 2 points  mMRC: mMRC (Modified Medical Research Council) Dyspnea Scale is used to assess the degree of baseline functional disability in patients of respiratory disease due to dyspnea. No minimal important difference is established. A decrease in score of 1 point or greater is considered a positive change.   Pulmonary Function Assessment:   Exercise Target Goals: Exercise Program Goal: Individual exercise prescription set using results from initial 6 min walk test and THRR while considering  patient's activity barriers and safety.   Exercise Prescription Goal: Initial exercise prescription builds to 30-45 minutes a day of aerobic activity, 2-3 days per week.  Home exercise guidelines will be given to patient during program as part of exercise prescription that the participant will acknowledge.  Activity Barriers & Risk Stratification:  Activity Barriers & Cardiac Risk Stratification - 07/07/23  1022       Activity Barriers & Cardiac Risk Stratification   Activity Barriers Deconditioning;Muscular Weakness;Shortness of Breath    Cardiac Risk Stratification High             6 Minute Walk:  6 Minute Walk     Row Name 07/07/23 1109         6 Minute Walk   Phase Initial     Distance 1300 feet     Walk Time 6 minutes     # of Rest Breaks 0     MPH 2.46     METS 3.1     RPE 9     Perceived Dyspnea  0     VO2 Peak 10.85     Symptoms No     Resting HR 67 bpm     Resting BP 146/82     Resting Oxygen Saturation  100 %     Exercise Oxygen Saturation  during 6 min walk 95 %     Max Ex. HR 84 bpm     Max Ex. BP 160/80     2 Minute Post BP 170/80       Interval HR   1 Minute HR 71     2 Minute HR 78     3 Minute HR 80     4 Minute HR 79     5 Minute HR 82     6 Minute HR 84     2 Minute Post HR 68     Interval Heart Rate? Yes       Interval Oxygen   Interval Oxygen? Yes     Baseline Oxygen Saturation % 100 %     1 Minute Oxygen Saturation % 97 %     1 Minute Liters of Oxygen 0 L     2 Minute Oxygen Saturation %  95 %     2 Minute Liters of Oxygen 0 L     3 Minute Oxygen Saturation % 98 %     3 Minute Liters of Oxygen 0 L     4 Minute Oxygen Saturation % 98 %     4 Minute Liters of Oxygen 0 L     5 Minute Oxygen Saturation % 99 %     5 Minute Liters of Oxygen 0 L     6 Minute Oxygen Saturation % 98 %     6 Minute Liters of Oxygen 0 L     2 Minute Post Oxygen Saturation % 99 %     2 Minute Post Liters of Oxygen 0 L              Oxygen Initial Assessment:  Oxygen Initial Assessment - 07/07/23 1020       Home Oxygen   Home Oxygen Device None    Sleep Oxygen Prescription None    Home Exercise Oxygen Prescription None    Home Resting Oxygen Prescription None    Compliance with Home Oxygen Use Yes      Initial 6 min Walk   Oxygen Used None      Program Oxygen Prescription   Program Oxygen Prescription None      Intervention   Short Term  Goals To learn and exhibit compliance with exercise, home and travel O2 prescription;To learn and understand importance of maintaining oxygen saturations>88%;To learn and demonstrate proper use of respiratory medications;To learn and understand importance of monitoring SPO2 with pulse oximeter and demonstrate accurate use of the pulse oximeter.;To learn and demonstrate proper pursed lip breathing techniques or other breathing techniques. ;Other    Long  Term Goals Exhibits compliance with exercise, home  and travel O2 prescription;Verbalizes importance of monitoring SPO2 with pulse oximeter and return demonstration;Maintenance of O2 saturations>88%;Exhibits proper breathing techniques, such as pursed lip breathing or other method taught during program session;Compliance with respiratory medication;Demonstrates proper use of MDI's;Other             Oxygen Re-Evaluation:   Oxygen Discharge (Final Oxygen Re-Evaluation):   Initial Exercise Prescription:  Initial Exercise Prescription - 07/07/23 1100       Date of Initial Exercise RX and Referring Provider   Date 07/07/23    Referring Provider D'amico    Expected Discharge Date 07/29/23      Treadmill   MPH 2.3    Grade 0    Minutes 15    METs 3      Bike   Level 2    Minutes 15    METs 3      Prescription Details   Frequency (times per week) 2    Duration Progress to 30 minutes of continuous aerobic without signs/symptoms of physical distress      Intensity   THRR 40-80% of Max Heartrate 59-132    Ratings of Perceived Exertion 11-13    Perceived Dyspnea 0-4      Progression   Progression Continue to progress workloads to maintain intensity without signs/symptoms of physical distress.      Resistance Training   Training Prescription Yes    Weight blue bands    Reps 10-15             Perform Capillary Blood Glucose checks as needed.  Exercise Prescription Changes:   Exercise Comments:   Exercise Goals and  Review:   Exercise Goals     Row Name  07/07/23 1022             Exercise Goals   Increase Physical Activity Yes       Intervention Provide advice, education, support and counseling about physical activity/exercise needs.;Develop an individualized exercise prescription for aerobic and resistive training based on initial evaluation findings, risk stratification, comorbidities and participant's personal goals.       Expected Outcomes Short Term: Attend rehab on a regular basis to increase amount of physical activity.;Long Term: Add in home exercise to make exercise part of routine and to increase amount of physical activity.;Long Term: Exercising regularly at least 3-5 days a week.       Increase Strength and Stamina Yes       Intervention Provide advice, education, support and counseling about physical activity/exercise needs.;Develop an individualized exercise prescription for aerobic and resistive training based on initial evaluation findings, risk stratification, comorbidities and participant's personal goals.       Expected Outcomes Short Term: Increase workloads from initial exercise prescription for resistance, speed, and METs.;Short Term: Perform resistance training exercises routinely during rehab and add in resistance training at home;Long Term: Improve cardiorespiratory fitness, muscular endurance and strength as measured by increased METs and functional capacity ( )       Able to understand and use rate of perceived exertion (RPE) scale Yes       Intervention Provide education and explanation on how to use RPE scale       Expected Outcomes Short Term: Able to use RPE daily in rehab to express subjective intensity level;Long Term:  Able to use RPE to guide intensity level when exercising independently       Able to understand and use Dyspnea scale Yes       Intervention Provide education and explanation on how to use Dyspnea scale       Expected Outcomes Short Term: Able to use  Dyspnea scale daily in rehab to express subjective sense of shortness of breath during exertion;Long Term: Able to use Dyspnea scale to guide intensity level when exercising independently       Knowledge and understanding of Target Heart Rate Range (THRR) Yes       Intervention Provide education and explanation of THRR including how the numbers were predicted and where they are located for reference       Expected Outcomes Short Term: Able to state/look up THRR;Long Term: Able to use THRR to govern intensity when exercising independently;Short Term: Able to use daily as guideline for intensity in rehab       Understanding of Exercise Prescription Yes       Intervention Provide education, explanation, and written materials on patient's individual exercise prescription       Expected Outcomes Short Term: Able to explain program exercise prescription;Long Term: Able to explain home exercise prescription to exercise independently                Exercise Goals Re-Evaluation :   Discharge Exercise Prescription (Final Exercise Prescription Changes):   Nutrition:  Target Goals: Understanding of nutrition guidelines, daily intake of sodium 1500mg , cholesterol 200mg , calories 30% from fat and 7% or less from saturated fats, daily to have 5 or more servings of fruits and vegetables.  Biometrics:  Pre Biometrics - 07/07/23 1112       Pre Biometrics   Grip Strength 24 kg              Nutrition Therapy Plan and Nutrition Goals:   Nutrition Assessments:  MEDIFICTS Score Key: >=70 Need to make dietary changes  40-70 Heart Healthy Diet <= 40 Therapeutic Level Cholesterol Diet   Picture Your Plate Scores: <16 Unhealthy dietary pattern with much room for improvement. 41-50 Dietary pattern unlikely to meet recommendations for good health and room for improvement. 51-60 More healthful dietary pattern, with some room for improvement.  >60 Healthy dietary pattern, although there may be  some specific behaviors that could be improved.    Nutrition Goals Re-Evaluation:   Nutrition Goals Discharge (Final Nutrition Goals Re-Evaluation):   Psychosocial: Target Goals: Acknowledge presence or absence of significant depression and/or stress, maximize coping skills, provide positive support system. Participant is able to verbalize types and ability to use techniques and skills needed for reducing stress and depression.  Initial Review & Psychosocial Screening:  Initial Psych Review & Screening - 07/07/23 1006       Initial Review   Current issues with None Identified      Family Dynamics   Good Support System? Yes    Comments son and daughter      Barriers   Psychosocial barriers to participate in program The patient should benefit from training in stress management and relaxation.      Screening Interventions   Interventions Encouraged to exercise    Expected Outcomes Short Term goal: Utilizing psychosocial counselor, staff and physician to assist with identification of specific Stressors or current issues interfering with healing process. Setting desired goal for each stressor or current issue identified.;Long Term Goal: Stressors or current issues are controlled or eliminated.;Short Term goal: Identification and review with participant of any Quality of Life or Depression concerns found by scoring the questionnaire.;Long Term goal: The participant improves quality of Life and PHQ9 Scores as seen by post scores and/or verbalization of changes             Quality of Life Scores:  Scores of 19 and below usually indicate a poorer quality of life in these areas.  A difference of  2-3 points is a clinically meaningful difference.  A difference of 2-3 points in the total score of the Quality of Life Index has been associated with significant improvement in overall quality of life, self-image, physical symptoms, and general health in studies assessing change in quality of  life.  PHQ-9: Review Flowsheet  More data exists      07/07/2023 09/04/2022 03/06/2021 02/13/2021 12/19/2020  Depression screen PHQ 2/9  Decreased Interest 0 0 0 0 0  Down, Depressed, Hopeless 0 0 0 0 0  PHQ - 2 Score 0 0 0 0 0  Altered sleeping 0 - 0 - 1  Tired, decreased energy 1 - 0 - 0  Change in appetite 0 - 0 - 0  Feeling bad or failure about yourself  0 - 0 - 0  Trouble concentrating 0 - 0 - 0  Moving slowly or fidgety/restless 0 - 0 - 0  Suicidal thoughts - - 0 - 0  PHQ-9 Score 1 - 0 - 1  Difficult doing work/chores Not difficult at all - - - Somewhat difficult   Interpretation of Total Score  Total Score Depression Severity:  1-4 = Minimal depression, 5-9 = Mild depression, 10-14 = Moderate depression, 15-19 = Moderately severe depression, 20-27 = Severe depression   Psychosocial Evaluation and Intervention:  Psychosocial Evaluation - 07/07/23 1010       Psychosocial Evaluation & Interventions   Interventions Relaxation education;Encouraged to exercise with the program and follow exercise prescription  Expected Outcomes For pt to participate in PR before her lung surgery    Continue Psychosocial Services  Follow up required by staff             Psychosocial Re-Evaluation:   Psychosocial Discharge (Final Psychosocial Re-Evaluation):   Education: Education Goals: Education classes will be provided on a weekly basis, covering required topics. Participant will state understanding/return demonstration of topics presented.  Learning Barriers/Preferences:  Learning Barriers/Preferences - 07/07/23 1012       Learning Barriers/Preferences   Learning Barriers Hearing;Reading    Learning Preferences Written Material;Skilled Demonstration             Education Topics: Know Your Numbers Group instruction that is supported by a PowerPoint presentation. Instructor discusses importance of knowing and understanding resting, exercise, and post-exercise oxygen  saturation, heart rate, and blood pressure. Oxygen saturation, heart rate, blood pressure, rating of perceived exertion, and dyspnea are reviewed along with a normal range for these values.    Exercise for the Pulmonary Patient Group instruction that is supported by a PowerPoint presentation. Instructor discusses benefits of exercise, core components of exercise, frequency, duration, and intensity of an exercise routine, importance of utilizing pulse oximetry during exercise, safety while exercising, and options of places to exercise outside of rehab.    MET Level  Group instruction provided by PowerPoint, verbal discussion, and written material to support subject matter. Instructor reviews what METs are and how to increase METs.    Pulmonary Medications Verbally interactive group education provided by instructor with focus on inhaled medications and proper administration.   Anatomy and Physiology of the Respiratory System Group instruction provided by PowerPoint, verbal discussion, and written material to support subject matter. Instructor reviews respiratory cycle and anatomical components of the respiratory system and their functions. Instructor also reviews differences in obstructive and restrictive respiratory diseases with examples of each.    Oxygen Safety Group instruction provided by PowerPoint, verbal discussion, and written material to support subject matter. There is an overview of "What is Oxygen" and "Why do we need it".  Instructor also reviews how to create a safe environment for oxygen use, the importance of using oxygen as prescribed, and the risks of noncompliance. There is a brief discussion on traveling with oxygen and resources the patient may utilize.   Oxygen Use Group instruction provided by PowerPoint, verbal discussion, and written material to discuss how supplemental oxygen is prescribed and different types of oxygen supply systems. Resources for more information  are provided.    Breathing Techniques Group instruction that is supported by demonstration and informational handouts. Instructor discusses the benefits of pursed lip and diaphragmatic breathing and detailed demonstration on how to perform both.     Risk Factor Reduction Group instruction that is supported by a PowerPoint presentation. Instructor discusses the definition of a risk factor, different risk factors for pulmonary disease, and how the heart and lungs work together.   Pulmonary Diseases Group instruction provided by PowerPoint, verbal discussion, and written material to support subject matter. Instructor gives an overview of the different type of pulmonary diseases. There is also a discussion on risk factors and symptoms as well as ways to manage the diseases.   Stress and Energy Conservation Group instruction provided by PowerPoint, verbal discussion, and written material to support subject matter. Instructor gives an overview of stress and the impact it can have on the body. Instructor also reviews ways to reduce stress. There is also a discussion on energy conservation and ways to  conserve energy throughout the day.   Warning Signs and Symptoms Group instruction provided by PowerPoint, verbal discussion, and written material to support subject matter. Instructor reviews warning signs and symptoms of stroke, heart attack, cold and flu. Instructor also reviews ways to prevent the spread of infection.   Other Education Group or individual verbal, written, or video instructions that support the educational goals of the pulmonary rehab program.    Knowledge Questionnaire Score:  Knowledge Questionnaire Score - 07/07/23 1012       Knowledge Questionnaire Score   Pre Score 11/18             Core Components/Risk Factors/Patient Goals at Admission:  Personal Goals and Risk Factors at Admission - 07/07/23 1016       Core Components/Risk Factors/Patient Goals on  Admission   Tobacco Cessation Yes    Number of packs per day quit 06/12/23, stay quit    Improve shortness of breath with ADL's Yes    Intervention Provide education, individualized exercise plan and daily activity instruction to help decrease symptoms of SOB with activities of daily living.    Expected Outcomes Short Term: Improve cardiorespiratory fitness to achieve a reduction of symptoms when performing ADLs;Long Term: Be able to perform more ADLs without symptoms or delay the onset of symptoms             Core Components/Risk Factors/Patient Goals Review:    Core Components/Risk Factors/Patient Goals at Discharge (Final Review):    ITP Comments: Pt will exercise 7 sessions before her lung resection. She hopes to be referred to program again after her surgery.   Comments: Dr. Mechele Collin is Medical Director for Pulmonary Rehab at [facility].

## 2023-07-07 NOTE — Progress Notes (Signed)
Pulmonary Rehab Orientation Physical Assessment Note  Physical assessment reveals  Pt who is familiar to this staff from previous participations is alert and oriented x 3.  Heart rate is normal, breath sounds diminished on the right mid to lower lobes, clear RUL; rhonchi heard left lung. Reports non-productive cough and that she has stopped smoking. She is congratulated for this!  Bowel sounds present  Pt denies abdominal discomfort, nausea, vomiting or diarrhea. Grip strength equal, strong. 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  Distal pulses palpable with no swelling to lower extremities. Alanson Aly, BSN Cardiac and Emergency planning/management officer

## 2023-07-08 ENCOUNTER — Ambulatory Visit (HOSPITAL_COMMUNITY): Payer: Medicare Other

## 2023-07-08 ENCOUNTER — Encounter (HOSPITAL_COMMUNITY)
Admission: RE | Admit: 2023-07-08 | Discharge: 2023-07-08 | Disposition: A | Discharge status: Home / Self Care | Payer: Medicare Other | Source: Ambulatory Visit | Attending: Pulmonary Disease | Admitting: Pulmonary Disease

## 2023-07-08 DIAGNOSIS — J449 Chronic obstructive pulmonary disease, unspecified: Secondary | ICD-10-CM

## 2023-07-08 NOTE — Telephone Encounter (Signed)
Dear Ms Beadles  Thank you for your kind words. You will do fine. Cheering you on My regards to [place]. YOu are in good hands  MR

## 2023-07-09 NOTE — Telephone Encounter (Signed)
Patient made aware. Patient will cx next appt.

## 2023-07-09 NOTE — Progress Notes (Signed)
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)  Patient's Home Medications on Admission:   Current Outpatient Medications:    acetaminophen (TYLENOL) 500 MG tablet, Take 1,000 mg by mouth every 6 (six) hours as needed for mild pain or headache., Disp: , Rfl:    buPROPion (WELLBUTRIN SR) 150 MG 12 hr tablet, TAKE 1 TABLET BY MOUTH TWICE A DAY (Patient not taking: Reported on 07/07/2023), Disp: 180 tablet, Rfl: 2   buPROPion (WELLBUTRIN SR) 150 MG 12 hr tablet, Take 150mg  once a day for 3 days, increase to 150mg  twice a day (Patient not taking: Reported on 07/07/2023), Disp: 60 tablet, Rfl: 0   Cholecalciferol (VITAMIN D) 125 MCG (5000 UT) CAPS, Take 5,000 Units by mouth daily., Disp: , Rfl:    clopidogrel (PLAVIX) 75 MG tablet, Take 1 tablet (75 mg total) by mouth daily., Disp: 90 tablet, Rfl: 2   EPINEPHrine (EPIPEN 2-PAK) 0.3 mg/0.3 mL IJ SOAJ injection, Inject 0.3 mg into the muscle as needed for anaphylaxis., Disp: 1 each, Rfl: 1   EPINEPHrine 0.3 mg/0.3 mL IJ SOAJ injection, Inject 0.3 mg into the muscle as needed., Disp: , Rfl:    ferrous sulfate 325 (65 FE) MG tablet, Take 1 tablet (325 mg total) by mouth daily. (Patient taking differently: Take 325 mg by mouth daily. OTC), Disp: 90 tablet, Rfl: 3   Fexofenadine HCl (MUCINEX ALLERGY PO), Take 1 tablet by mouth daily., Disp: , Rfl:    fluticasone (FLONASE) 50 MCG/ACT nasal spray, Place 1 spray into both nostrils daily. (Patient not taking:  Reported on 07/07/2023), Disp: , Rfl:    Fluticasone-Umeclidin-Vilant (TRELEGY ELLIPTA) 100-62.5-25 MCG/ACT AEPB, INHALE 1 PUFF BY MOUTH EVERY DAY, Disp: 60 each, Rfl: 5   hydrALAZINE (APRESOLINE) 25 MG tablet, Take 1 tablet (25 mg total) by mouth daily as needed (for systolic BP>150)., Disp: 30 tablet, Rfl: 9   levothyroxine (SYNTHROID) 150 MCG tablet, Take 1 tablet (150 mcg total) by mouth daily., Disp: 90 tablet, Rfl: 3   MELATONIN PO, Take 1 tablet by mouth at bedtime as needed (sleep)., Disp: , Rfl:    Multiple Vitamins-Minerals (MULTIVITAMIN WITH MINERALS) tablet, Take 1 tablet by mouth daily., Disp: , Rfl:    nicotine polacrilex (NICORETTE) 2 MG gum, Take 2 mg by mouth as needed., Disp: , Rfl:    nitroGLYCERIN (NITROSTAT) 0.4 MG SL tablet, Place 0.4 mg under the tongue every 5 (five) minutes as needed. (Patient not taking: Reported on 07/07/2023), Disp: , Rfl:    rosuvastatin (CRESTOR) 20 MG tablet, Take 1 tablet (20 mg total) by mouth every evening. (Patient not taking: Reported on 07/07/2023), Disp: 90 tablet, Rfl: 3   rosuvastatin (CRESTOR) 20 MG tablet, Take 1 tablet by mouth daily., Disp: , Rfl:    Turmeric 500 MG CAPS, Take 500 mg by mouth daily., Disp: , Rfl:    vitamin B-12 (CYANOCOBALAMIN) 1000 MCG tablet, Take 1 tablet (1,000 mcg total) by mouth daily., Disp: , Rfl:    Zinc 30  MG TABS, Take 30 mg by mouth. 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 [facility] (had to have botox injection)   COPD (chronic obstructive pulmonary disease) (HCC)    Family history of adverse reaction to anesthesia    Mother- very sensitive to  medication   Heart murmur    MVP   Hypothyroidism    Lung cancer (HCC) 12/30/2014   Synchronous primaries:  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, each treated by wedge resection with post-op adjuvant chemoRx at [facility]   MVP (mitral valve prolapse) 2018   s/p Minimally-Invasive Mitral Valve Replacement w/  [facility] 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   31 mm Edwards Magna mitral bovine bioprosthetic tissue valve placed via right mini thoracotomy approach   Severe mitral regurgitation 11/15/2014   Shortness of breath dyspnea    with exertion   STD (sexually transmitted disease)    STEMI (ST elevation myocardial infarction) (HCC) 10/24/2020   Telangiectasia     Tobacco Use: Social History   Tobacco Use  Smoking Status Former   Current packs/day: 0.00   Average packs/day: 1 pack/day for 40.1 years (40.1 ttl pk-yrs)   Types: Cigarettes   Start date: 05/21/1983   Quit date: 06/12/2023   Years since quitting: 0.0   Passive exposure: Never  Smokeless Tobacco Never  Tobacco Comments   Quit 06/12/23    Labs: Review Flowsheet  More data exists      Latest Ref Rng & Units 06/15/2020 08/07/2020 10/24/2020 05/30/2022 08/28/2022  Labs for ITP Cardiac and Pulmonary Rehab  Cholestrol 100 - 199 mg/dL - 147  829  562  130   LDL (calc) 0 - 99 mg/dL - 50  55  54  55   Direct LDL 0 - 99 mg/dL 63  - - - -  HDL-C >86 mg/dL - 46  60  66  67   Trlycerides 0 - 149 mg/dL - 578  469  98  629   Hemoglobin A1c 4.8 - 5.6 % - 5.6  5.5  - -  TCO2 22 - 32 mmol/L - - 24  - -    Capillary Blood Glucose: Lab Results  Component Value Date   GLUCAP 92 01/07/2023   GLUCAP 94 11/03/2020   GLUCAP 85 11/03/2020   GLUCAP 81 03/11/2018   GLUCAP 86 07/03/2016     Pulmonary Assessment Scores:   Pulmonary Assessment Scores     Row Name 07/07/23 1021         ADL UCSD   ADL Phase Entry     SOB Score total 6       CAT Score   CAT Score 11       mMRC Score   mMRC Score 2             UCSD: Self-administered rating of dyspnea associated with  activities of daily living (ADLs) 6-point scale (0 = "not at all" to 5 = "maximal or unable to do because of breathlessness")  Scoring Scores range from 0 to 120.  Minimally important difference is 5 units  CAT: CAT can identify the health impairment of COPD patients and is better correlated with disease progression.  CAT has a scoring range of zero to 40. The CAT score is classified into four groups of low (less than 10), medium (10 - 20), high (21-30) and very high (31-40) based on the impact level of disease on health status. A CAT score over 10 suggests significant symptoms.  A worsening CAT score could be explained by an exacerbation, poor medication adherence, poor inhaler technique, or progression of COPD or comorbid conditions.  CAT MCID is 2 points  mMRC: mMRC (Modified Medical Research Council) Dyspnea Scale is used to assess the degree of baseline functional disability in patients of respiratory disease due to dyspnea. No minimal important difference is established. A decrease in score of 1 point or greater is considered a positive change.   Pulmonary Function Assessment:   Exercise Target Goals: Exercise Program Goal: Individual exercise prescription set using results from initial 6 min walk test and THRR while considering  patient's activity barriers and safety.   Exercise Prescription Goal: Initial exercise prescription builds to 30-45 minutes a day of aerobic activity, 2-3 days per week.  Home exercise guidelines will be given to patient during program as part of exercise prescription that the participant will acknowledge.  Activity Barriers & Risk Stratification:  Activity Barriers & Cardiac Risk Stratification -  07/07/23 1022       Activity Barriers & Cardiac Risk Stratification   Activity Barriers Deconditioning;Muscular Weakness;Shortness of Breath    Cardiac Risk Stratification High             6 Minute Walk:  6 Minute Walk     Row Name 07/07/23 1109         6 Minute Walk   Phase Initial     Distance 1300 feet     Walk Time 6 minutes     # of Rest Breaks 0     MPH 2.46     METS 3.1     RPE 9     Perceived Dyspnea  0     VO2 Peak 10.85     Symptoms No     Resting HR 67 bpm     Resting BP 146/82     Resting Oxygen Saturation  100 %     Exercise Oxygen Saturation  during 6 min walk 95 %     Max Ex. HR 84 bpm     Max Ex. BP 160/80     2 Minute Post BP 170/80       Interval HR   1 Minute HR 71     2 Minute HR 78     3 Minute HR 80     4 Minute HR 79     5 Minute HR 82     6 Minute HR 84     2 Minute Post HR 68     Interval Heart Rate? Yes       Interval Oxygen   Interval Oxygen? Yes     Baseline Oxygen Saturation % 100 %     1 Minute Oxygen Saturation % 97 %     1 Minute Liters of Oxygen 0 L     2 Minute Oxygen Saturation %  95 %     2 Minute Liters of Oxygen 0 L     3 Minute Oxygen Saturation % 98 %     3 Minute Liters of Oxygen 0 L     4 Minute Oxygen Saturation % 98 %     4 Minute Liters of Oxygen 0 L     5 Minute Oxygen Saturation % 99 %     5 Minute Liters of Oxygen 0 L     6 Minute Oxygen Saturation % 98 %     6 Minute Liters of Oxygen 0 L     2 Minute Post Oxygen Saturation % 99 %     2 Minute Post Liters of Oxygen 0 L              Oxygen Initial Assessment:  Oxygen Initial Assessment - 07/07/23 1020       Home Oxygen   Home Oxygen Device None    Sleep Oxygen Prescription None    Home Exercise Oxygen Prescription None    Home Resting Oxygen Prescription None    Compliance with Home Oxygen Use Yes      Initial 6 min Walk   Oxygen Used None      Program Oxygen Prescription   Program Oxygen Prescription None      Intervention   Short  Term Goals To learn and exhibit compliance with exercise, home and travel O2 prescription;To learn and understand importance of maintaining oxygen saturations>88%;To learn and demonstrate proper use of respiratory medications;To learn and understand importance of monitoring SPO2 with pulse oximeter and demonstrate accurate use of the pulse oximeter.;To learn and demonstrate proper pursed lip breathing techniques or other breathing techniques. ;Other    Long  Term Goals Exhibits compliance with exercise, home  and travel O2 prescription;Verbalizes importance of monitoring SPO2 with pulse oximeter and return demonstration;Maintenance of O2 saturations>88%;Exhibits proper breathing techniques, such as pursed lip breathing or other method taught during program session;Compliance with respiratory medication;Demonstrates proper use of MDI's;Other             Oxygen Re-Evaluation:   Oxygen Discharge (Final Oxygen Re-Evaluation):   Initial Exercise Prescription:  Initial Exercise Prescription - 07/07/23 1100       Date of Initial Exercise RX and Referring Provider   Date 07/07/23    Referring Provider D'amico    Expected Discharge Date 07/29/23      Treadmill   MPH 2.3    Grade 0    Minutes 15    METs 3      Bike   Level 2    Minutes 15    METs 3      Prescription Details   Frequency (times per week) 2    Duration Progress to 30 minutes of continuous aerobic without signs/symptoms of physical distress      Intensity   THRR 40-80% of Max Heartrate 59-132    Ratings of Perceived Exertion 11-13    Perceived Dyspnea 0-4      Progression   Progression Continue to progress workloads to maintain intensity without signs/symptoms of physical distress.      Resistance Training   Training Prescription Yes    Weight blue bands    Reps 10-15             Perform Capillary Blood Glucose checks as needed.  Exercise Prescription Changes:   Exercise Comments:   Exercise  Comments     Row Name 07/08/23 1540  Exercise Comments Pt completed first day of exercise. She walked on the treadmill for 15 min, speed 1.7 mph, METs 2.2. She then exercised on the scifit bike for 15 min, level 1, METs 2.6. tolerated well. Performed warm up and cool down without restrictions. Discussed METs with good understanding.                Exercise Goals and Review:   Exercise Goals     Row Name 07/07/23 1022             Exercise Goals   Increase Physical Activity Yes       Intervention Provide advice, education, support and counseling about physical activity/exercise needs.;Develop an individualized exercise prescription for aerobic and resistive training based on initial evaluation findings, risk stratification, comorbidities and participant's personal goals.       Expected Outcomes Short Term: Attend rehab on a regular basis to increase amount of physical activity.;Long Term: Add in home exercise to make exercise part of routine and to increase amount of physical activity.;Long Term: Exercising regularly at least 3-5 days a week.       Increase Strength and Stamina Yes       Intervention Provide advice, education, support and counseling about physical activity/exercise needs.;Develop an individualized exercise prescription for aerobic and resistive training based on initial evaluation findings, risk stratification, comorbidities and participant's personal goals.       Expected Outcomes Short Term: Increase workloads from initial exercise prescription for resistance, speed, and METs.;Short Term: Perform resistance training exercises routinely during rehab and add in resistance training at home;Long Term: Improve cardiorespiratory fitness, muscular endurance and strength as measured by increased METs and functional capacity ( )       Able to understand and use rate of perceived exertion (RPE) scale Yes       Intervention Provide education and explanation on how  to use RPE scale       Expected Outcomes Short Term: Able to use RPE daily in rehab to express subjective intensity level;Long Term:  Able to use RPE to guide intensity level when exercising independently       Able to understand and use Dyspnea scale Yes       Intervention Provide education and explanation on how to use Dyspnea scale       Expected Outcomes Short Term: Able to use Dyspnea scale daily in rehab to express subjective sense of shortness of breath during exertion;Long Term: Able to use Dyspnea scale to guide intensity level when exercising independently       Knowledge and understanding of Target Heart Rate Range (THRR) Yes       Intervention Provide education and explanation of THRR including how the numbers were predicted and where they are located for reference       Expected Outcomes Short Term: Able to state/look up THRR;Long Term: Able to use THRR to govern intensity when exercising independently;Short Term: Able to use daily as guideline for intensity in rehab       Understanding of Exercise Prescription Yes       Intervention Provide education, explanation, and written materials on patient's individual exercise prescription       Expected Outcomes Short Term: Able to explain program exercise prescription;Long Term: Able to explain home exercise prescription to exercise independently                Exercise Goals Re-Evaluation :  Exercise Goals Re-Evaluation     Row Name 07/08/23 [phone]  Exercise Goal Re-Evaluation   Exercise Goals Review Increase Physical Activity;Able to understand and use Dyspnea scale;Understanding of Exercise Prescription;Increase Strength and Stamina;Knowledge and understanding of Target Heart Rate Range (THRR);Able to understand and use rate of perceived exertion (RPE) scale       Comments Pt to begin exercise 07/08/23. Will progress as tolerated.       Expected Outcomes Through exercise at rehab and home, the patient will decrease  shortness of breath with daily activities and feel confident in carrying out an exercise regimen at home                Discharge Exercise Prescription (Final Exercise Prescription Changes):   Nutrition:  Target Goals: Understanding of nutrition guidelines, daily intake of sodium 1500mg , cholesterol 200mg , calories 30% from fat and 7% or less from saturated fats, daily to have 5 or more servings of fruits and vegetables.  Biometrics:  Pre Biometrics - 07/07/23 1112       Pre Biometrics   Grip Strength 24 kg              Nutrition Therapy Plan and Nutrition Goals:   Nutrition Assessments:  MEDIFICTS Score Key: >=70 Need to make dietary changes  40-70 Heart Healthy Diet <= 40 Therapeutic Level Cholesterol Diet   Picture Your Plate Scores: <16 Unhealthy dietary pattern with much room for improvement. 41-50 Dietary pattern unlikely to meet recommendations for good health and room for improvement. 51-60 More healthful dietary pattern, with some room for improvement.  >60 Healthy dietary pattern, although there may be some specific behaviors that could be improved.    Nutrition Goals Re-Evaluation:   Nutrition Goals Discharge (Final Nutrition Goals Re-Evaluation):   Psychosocial: Target Goals: Acknowledge presence or absence of significant depression and/or stress, maximize coping skills, provide positive support system. Participant is able to verbalize types and ability to use techniques and skills needed for reducing stress and depression.  Initial Review & Psychosocial Screening:  Initial Psych Review & Screening - 07/07/23 1006       Initial Review   Current issues with None Identified      Family Dynamics   Good Support System? Yes    Comments son and daughter      Barriers   Psychosocial barriers to participate in program The patient should benefit from training in stress management and relaxation.      Screening Interventions   Interventions  Encouraged to exercise    Expected Outcomes Short Term goal: Utilizing psychosocial counselor, staff and physician to assist with identification of specific Stressors or current issues interfering with healing process. Setting desired goal for each stressor or current issue identified.;Long Term Goal: Stressors or current issues are controlled or eliminated.;Short Term goal: Identification and review with participant of any Quality of Life or Depression concerns found by scoring the questionnaire.;Long Term goal: The participant improves quality of Life and PHQ9 Scores as seen by post scores and/or verbalization of changes             Quality of Life Scores:  Scores of 19 and below usually indicate a poorer quality of life in these areas.  A difference of  2-3 points is a clinically meaningful difference.  A difference of 2-3 points in the total score of the Quality of Life Index has been associated with significant improvement in overall quality of life, self-image, physical symptoms, and general health in studies assessing change in quality of life.  PHQ-9: Review Flowsheet  More data exists  07/07/2023 09/04/2022 03/06/2021 02/13/2021 12/19/2020  Depression screen PHQ 2/9  Decreased Interest 0 0 0 0 0  Down, Depressed, Hopeless 0 0 0 0 0  PHQ - 2 Score 0 0 0 0 0  Altered sleeping 0 - 0 - 1  Tired, decreased energy 1 - 0 - 0  Change in appetite 0 - 0 - 0  Feeling bad or failure about yourself  0 - 0 - 0  Trouble concentrating 0 - 0 - 0  Moving slowly or fidgety/restless 0 - 0 - 0  Suicidal thoughts - - 0 - 0  PHQ-9 Score 1 - 0 - 1  Difficult doing work/chores Not difficult at all - - - Somewhat difficult   Interpretation of Total Score  Total Score Depression Severity:  1-4 = Minimal depression, 5-9 = Mild depression, 10-14 = Moderate depression, 15-19 = Moderately severe depression, 20-27 = Severe depression   Psychosocial Evaluation and Intervention:  Psychosocial Evaluation -  07/07/23 1010       Psychosocial Evaluation & Interventions   Interventions Relaxation education;Encouraged to exercise with the program and follow exercise prescription    Expected Outcomes For pt to participate in PR before her lung surgery    Continue Psychosocial Services  Follow up required by staff             Psychosocial Re-Evaluation:  Psychosocial Re-Evaluation     Row Name 07/08/23 0925             Psychosocial Re-Evaluation   Current issues with None Identified       Comments No changes since orientation. Margel is scheduled to start the program next week.       Expected Outcomes For Avrielle to participate in PR before her lung surgery, for pt to not have any barriers or concerns       Interventions Encouraged to attend Pulmonary Rehabilitation for the exercise       Continue Psychosocial Services  No Follow up required                Psychosocial Discharge (Final Psychosocial Re-Evaluation):  Psychosocial Re-Evaluation - 07/08/23 1610       Psychosocial Re-Evaluation   Current issues with None Identified    Comments No changes since orientation. Nemiah is scheduled to start the program next week.    Expected Outcomes For Recia to participate in PR before her lung surgery, for pt to not have any barriers or concerns    Interventions Encouraged to attend Pulmonary Rehabilitation for the exercise    Continue Psychosocial Services  No Follow up required             Education: Education Goals: Education classes will be provided on a weekly basis, covering required topics. Participant will state understanding/return demonstration of topics presented.  Learning Barriers/Preferences:  Learning Barriers/Preferences - 07/07/23 1012       Learning Barriers/Preferences   Learning Barriers Hearing;Reading    Learning Preferences Written Material;Skilled Demonstration             Education Topics: Know Your Numbers Group instruction that is  supported by a PowerPoint presentation. Instructor discusses importance of knowing and understanding resting, exercise, and post-exercise oxygen saturation, heart rate, and blood pressure. Oxygen saturation, heart rate, blood pressure, rating of perceived exertion, and dyspnea are reviewed along with a normal range for these values.    Exercise for the Pulmonary Patient Group instruction that is supported by a PowerPoint presentation. Instructor discusses benefits of  exercise, core components of exercise, frequency, duration, and intensity of an exercise routine, importance of utilizing pulse oximetry during exercise, safety while exercising, and options of places to exercise outside of rehab.    MET Level  Group instruction provided by PowerPoint, verbal discussion, and written material to support subject matter. Instructor reviews what METs are and how to increase METs.    Pulmonary Medications Verbally interactive group education provided by instructor with focus on inhaled medications and proper administration.   Anatomy and Physiology of the Respiratory System Group instruction provided by PowerPoint, verbal discussion, and written material to support subject matter. Instructor reviews respiratory cycle and anatomical components of the respiratory system and their functions. Instructor also reviews differences in obstructive and restrictive respiratory diseases with examples of each.    Oxygen Safety Group instruction provided by PowerPoint, verbal discussion, and written material to support subject matter. There is an overview of "What is Oxygen" and "Why do we need it".  Instructor also reviews how to create a safe environment for oxygen use, the importance of using oxygen as prescribed, and the risks of noncompliance. There is a brief discussion on traveling with oxygen and resources the patient may utilize.   Oxygen Use Group instruction provided by PowerPoint, verbal discussion, and  written material to discuss how supplemental oxygen is prescribed and different types of oxygen supply systems. Resources for more information are provided.    Breathing Techniques Group instruction that is supported by demonstration and informational handouts. Instructor discusses the benefits of pursed lip and diaphragmatic breathing and detailed demonstration on how to perform both.     Risk Factor Reduction Group instruction that is supported by a PowerPoint presentation. Instructor discusses the definition of a risk factor, different risk factors for pulmonary disease, and how the heart and lungs work together.   Pulmonary Diseases Group instruction provided by PowerPoint, verbal discussion, and written material to support subject matter. Instructor gives an overview of the different type of pulmonary diseases. There is also a discussion on risk factors and symptoms as well as ways to manage the diseases.   Stress and Energy Conservation Group instruction provided by PowerPoint, verbal discussion, and written material to support subject matter. Instructor gives an overview of stress and the impact it can have on the body. Instructor also reviews ways to reduce stress. There is also a discussion on energy conservation and ways to conserve energy throughout the day.   Warning Signs and Symptoms Group instruction provided by PowerPoint, verbal discussion, and written material to support subject matter. Instructor reviews warning signs and symptoms of stroke, heart attack, cold and flu. Instructor also reviews ways to prevent the spread of infection.   Other Education Group or individual verbal, written, or video instructions that support the educational goals of the pulmonary rehab program.    Knowledge Questionnaire Score:  Knowledge Questionnaire Score - 07/07/23 1012       Knowledge Questionnaire Score   Pre Score 11/18             Core Components/Risk Factors/Patient  Goals at Admission:  Personal Goals and Risk Factors at Admission - 07/07/23 1016       Core Components/Risk Factors/Patient Goals on Admission   Tobacco Cessation Yes    Number of packs per day quit 06/12/23, stay quit    Improve shortness of breath with ADL's Yes    Intervention Provide education, individualized exercise plan and daily activity instruction to help decrease symptoms of SOB with activities of  daily living.    Expected Outcomes Short Term: Improve cardiorespiratory fitness to achieve a reduction of symptoms when performing ADLs;Long Term: Be able to perform more ADLs without symptoms or delay the onset of symptoms             Core Components/Risk Factors/Patient Goals Review:   Goals and Risk Factor Review     Row Name 07/08/23 0932             Core Components/Risk Factors/Patient Goals Review   Personal Goals Review Improve shortness of breath with ADL's;Develop more efficient breathing techniques such as purse lipped breathing and diaphragmatic breathing and practicing self-pacing with activity.       Review Unable to assess, Alya is scheduled to start the program today       Expected Outcomes For Areona to improve her shortness of breath with ADLs and develop more efficient breathing techniques such as purse lipped breathing and diaphragmatic breathing; and practicing self-pacing with activity                Core Components/Risk Factors/Patient Goals at Discharge (Final Review):   Goals and Risk Factor Review - 07/08/23 0932       Core Components/Risk Factors/Patient Goals Review   Personal Goals Review Improve shortness of breath with ADL's;Develop more efficient breathing techniques such as purse lipped breathing and diaphragmatic breathing and practicing self-pacing with activity.    Review Unable to assess, Kendal is scheduled to start the program today    Expected Outcomes For Veva to improve her shortness of breath with ADLs and develop more  efficient breathing techniques such as purse lipped breathing and diaphragmatic breathing; and practicing self-pacing with activity             ITP Comments: Pt is making expected progress toward Pulmonary Rehab goals after completing 1 session(s). Recommend continued exercise, life style modification, education, and utilization of breathing techniques to increase stamina and strength, while also decreasing shortness of breath with exertion.  Dr. Mechele Collin is Medical Director for Pulmonary Rehab at [facility].

## 2023-07-10 ENCOUNTER — Ambulatory Visit (HOSPITAL_COMMUNITY): Payer: Medicare Other

## 2023-07-10 ENCOUNTER — Encounter (HOSPITAL_COMMUNITY): Payer: Medicare Other

## 2023-07-15 ENCOUNTER — Ambulatory Visit: Payer: Medicare Other | Admitting: Radiation Oncology

## 2023-07-15 ENCOUNTER — Ambulatory Visit (HOSPITAL_COMMUNITY): Payer: Medicare Other

## 2023-07-15 ENCOUNTER — Encounter (HOSPITAL_COMMUNITY)
Admission: RE | Admit: 2023-07-15 | Discharge: 2023-07-15 | Disposition: A | Discharge status: Home / Self Care | Payer: Medicare Other | Source: Ambulatory Visit | Attending: Pulmonary Disease | Admitting: Pulmonary Disease

## 2023-07-15 DIAGNOSIS — J449 Chronic obstructive pulmonary disease, unspecified: Secondary | ICD-10-CM

## 2023-07-15 NOTE — Progress Notes (Signed)
 Daily Session Note  Patient Details  Name: Joy Williams MRN: [id number] Date of Birth: Sep 25, 1950 Referring Provider:   Doristine Devoid Pulmonary Rehab Walk Test from 07/07/2023 in [facility] for Heart, Vascular, & Lung Health  Referring Provider [place]       Encounter Date: 07/15/2023  Check In:  Session Check In - 07/15/23 1326       Check-In   Supervising physician immediately available to respond to emergencies [facility] MD immediately available    Physician(s) Robin Searing, NP    Location [facility]    Staff Present Essie Hart, RN, Doris Cheadle, MS, ACSM-CEP, Exercise Physiologist;Gideon Burstein Chester Holstein, MS, Exercise Physiologist    Virtual Visit No    Medication changes reported     No    Fall or balance concerns reported    No    Tobacco Cessation Use Decreased   quit 06/12/23   Current number of cigarettes/nicotine per day     0    Warm-up and Cool-down Performed as group-led instruction   orientation only   Resistance Training Performed Yes    VAD Patient? No    PAD/SET Patient? No      Pain Assessment   Currently in Pain? No/denies    Multiple Pain Sites No             Capillary Blood Glucose: No results found for this or any previous visit (from the past 24 hours).    Social History   Tobacco Use  Smoking Status Former   Current packs/day: 0.00   Average packs/day: 1 pack/day for 40.1 years (40.1 ttl pk-yrs)   Types: Cigarettes   Start date: 05/21/1983   Quit date: 06/12/2023   Years since quitting: 0.0   Passive exposure: Never  Smokeless Tobacco Never  Tobacco Comments   Quit 06/12/23    Goals Met:  Proper associated with RPD/PD & O2 Sat Independence with exercise equipment Exercise tolerated well No report of concerns or symptoms today Strength training completed today  Goals Unmet:  Not Applicable  Comments: Service time is from 1312 to 1432.    Dr. Mechele Collin is Medical Director  for Pulmonary Rehab at [facility].

## 2023-07-16 ENCOUNTER — Ambulatory Visit: Payer: Medicare Other | Admitting: Radiation Oncology

## 2023-07-17 ENCOUNTER — Ambulatory Visit: Payer: Medicare Other | Admitting: Radiation Oncology

## 2023-07-17 ENCOUNTER — Encounter (HOSPITAL_COMMUNITY)
Admission: RE | Admit: 2023-07-17 | Discharge: 2023-07-17 | Disposition: A | Discharge status: Home / Self Care | Payer: Medicare Other | Source: Ambulatory Visit | Attending: Pulmonary Disease | Admitting: Pulmonary Disease

## 2023-07-17 ENCOUNTER — Ambulatory Visit (HOSPITAL_COMMUNITY): Payer: Medicare Other

## 2023-07-17 DIAGNOSIS — J449 Chronic obstructive pulmonary disease, unspecified: Secondary | ICD-10-CM | POA: Diagnosis not present

## 2023-07-17 DIAGNOSIS — R102 Pelvic and perineal pain: Secondary | ICD-10-CM | POA: Diagnosis not present

## 2023-07-17 DIAGNOSIS — N83202 Unspecified ovarian cyst, left side: Secondary | ICD-10-CM | POA: Diagnosis not present

## 2023-07-17 NOTE — Progress Notes (Signed)
 Daily Session Note  Patient Details  Name: Joy Williams MRN: [id number] Date of Birth: 03-23-1951 Referring Provider:   Doristine Devoid Pulmonary Rehab Walk Test from 07/07/2023 in [facility] for Heart, Vascular, & Lung Health  Referring Provider D'[place]ico       Encounter Date: 07/17/2023  Check In:  Session Check In - 07/17/23 1339       Check-In   Supervising physician immediately available to respond to emergencies [facility] MD immediately available    Physician(s) Bernadene Person, NP    Location [facility]    Staff Present Essie Hart, RN, Doris Cheadle, MS, ACSM-CEP, Exercise Physiologist;Tamu Golz Erin Sons BS, ACSM-CEP, Exercise Physiologist    Virtual Visit No    Medication changes reported     No    Fall or balance concerns reported    No    Tobacco Cessation Use Decreased   quit 06/12/23   Current number of cigarettes/nicotine per day     0    Warm-up and Cool-down Performed as group-led instruction    Resistance Training Performed Yes    VAD Patient? No    PAD/SET Patient? No      Pain Assessment   Currently in Pain? No/denies    Pain Score 0-No pain    Multiple Pain Sites No             Capillary Blood Glucose: No results found for this or any previous visit (from the past 24 hours).    Social History   Tobacco Use  Smoking Status Former   Current packs/day: 0.00   Average packs/day: 1 pack/day for 40.1 years (40.1 ttl pk-yrs)   Types: Cigarettes   Start date: 05/21/1983   Quit date: 06/12/2023   Years since quitting: 0.0   Passive exposure: Never  Smokeless Tobacco Never  Tobacco Comments   Quit 06/12/23    Goals Met:  Proper associated with RPD/PD & O2 Sat Independence with exercise equipment Exercise tolerated well No report of concerns or symptoms today Strength training completed today  Goals Unmet:  Not Applicable  Comments: Service time is from 1315 to 1459.    Dr. Mechele Collin is  Medical Director for Pulmonary Rehab at [facility].

## 2023-07-21 ENCOUNTER — Ambulatory Visit: Payer: Medicare Other | Admitting: Radiation Oncology

## 2023-07-22 ENCOUNTER — Ambulatory Visit: Payer: Medicare Other | Admitting: Radiation Oncology

## 2023-07-22 ENCOUNTER — Ambulatory Visit (HOSPITAL_COMMUNITY): Payer: Medicare Other

## 2023-07-22 ENCOUNTER — Encounter (HOSPITAL_COMMUNITY)
Admission: RE | Admit: 2023-07-22 | Discharge: 2023-07-22 | Disposition: A | Discharge status: Home / Self Care | Payer: Medicare Other | Source: Ambulatory Visit | Attending: Pulmonary Disease | Admitting: Pulmonary Disease

## 2023-07-22 VITALS — Wt 136.7 lb

## 2023-07-22 DIAGNOSIS — J449 Chronic obstructive pulmonary disease, unspecified: Secondary | ICD-10-CM | POA: Diagnosis not present

## 2023-07-22 NOTE — Progress Notes (Signed)
 Daily Session Note  Patient Details  Name: Joy Williams MRN: [id number] Date of Birth: 03-19-1951 Referring Provider:   Doristine Devoid Pulmonary Rehab Walk Test from 07/07/2023 in [facility] for Heart, Vascular, & Lung Health  Referring Provider D'amico       Encounter Date: 07/22/2023  Check In:  Session Check In - 07/22/23 1352       Check-In   Supervising physician immediately available to respond to emergencies [facility] MD immediately available    Physician(s) Bernadene Person, NP    Location [facility]    Staff Present Essie Hart, RN, Doris Cheadle, MS, ACSM-CEP, Exercise Physiologist;Casey Erin Sons BS, ACSM-CEP, Exercise Physiologist    Virtual Visit No    Medication changes reported     No    Fall or balance concerns reported    No    Tobacco Cessation Use Decreased   quit 06/12/23   Current number of cigarettes/nicotine per day     0    Warm-up and Cool-down Performed as group-led instruction    Resistance Training Performed Yes    VAD Patient? No    PAD/SET Patient? No      Pain Assessment   Currently in Pain? No/denies    Pain Score 0-No pain    Multiple Pain Sites No             Capillary Blood Glucose: No results found for this or any previous visit (from the past 24 hours).   Exercise Prescription Changes - 07/22/23 1500       Response to Exercise   Blood Pressure (Admit) 102/60    Blood Pressure (Exercise) 122/70    Blood Pressure (Exit) 92/56    Heart Rate (Admit) 65 bpm    Heart Rate (Exercise) 82 bpm    Heart Rate (Exit) 71 bpm    Oxygen Saturation (Admit) 98 %    Oxygen Saturation (Exercise) 96 %    Oxygen Saturation (Exit) 96 %    Rating of Perceived Exertion (Exercise) 11    Perceived Dyspnea (Exercise) 0.5    Duration Continue with 30 min of aerobic exercise without signs/symptoms of physical distress.    Intensity THRR unchanged      Progression   Progression Continue to progress  workloads to maintain intensity without signs/symptoms of physical distress.      Resistance Training   Training Prescription Yes    Weight blue bands    Reps 10-15    Time 10 Minutes      Treadmill   MPH 2.5    Grade 1.5    Minutes 215    METs 2.8      Bike   Level 2.5    Minutes 15    METs 3.3             Social History   Tobacco Use  Smoking Status Former   Current packs/day: 0.00   Average packs/day: 1 pack/day for 40.1 years (40.1 ttl pk-yrs)   Types: Cigarettes   Start date: 05/21/1983   Quit date: 06/12/2023   Years since quitting: 0.1   Passive exposure: Never  Smokeless Tobacco Never  Tobacco Comments   Quit 06/12/23    Goals Met:  Proper associated with RPD/PD & O2 Sat Exercise tolerated well No report of concerns or symptoms today Strength training completed today  Goals Unmet:  Not Applicable  Comments: Service time is from 1304 to 1441.    Dr. Erskine Squibb  Everardo All is Wellsite geologist for Pulmonary Rehab at [facility].

## 2023-07-23 ENCOUNTER — Ambulatory Visit: Payer: Medicare Other | Admitting: Radiation Oncology

## 2023-07-24 ENCOUNTER — Encounter (HOSPITAL_COMMUNITY)
Admission: RE | Admit: 2023-07-24 | Discharge: 2023-07-24 | Disposition: A | Discharge status: Home / Self Care | Source: Ambulatory Visit | Attending: Pulmonary Disease | Admitting: Pulmonary Disease

## 2023-07-24 ENCOUNTER — Ambulatory Visit: Payer: Medicare Other | Admitting: Internal Medicine

## 2023-07-24 ENCOUNTER — Encounter (HOSPITAL_COMMUNITY): Payer: Medicare Other

## 2023-07-24 DIAGNOSIS — J449 Chronic obstructive pulmonary disease, unspecified: Secondary | ICD-10-CM

## 2023-07-24 NOTE — Progress Notes (Signed)
 Daily Session Note  Patient Details  Name: GENNETTE SHADIX MRN: [id number] Date of Birth: 07-19-1950 Referring Provider:   Doristine Devoid Pulmonary Rehab Walk Test from 07/07/2023 in [facility] for Heart, Vascular, & Lung Health  Referring Provider D'[place]ico       Encounter Date: 07/24/2023  Check In:  Session Check In - 07/24/23 1513       Check-In   Supervising physician immediately available to respond to emergencies [facility] MD immediately available    Physician(s) Robin Searing, NP    Location [facility]    Staff Present Essie Hart, RN, Doris Cheadle, MS, ACSM-CEP, Exercise Physiologist;Nathalee Smarr Erin Sons BS, ACSM-CEP, Exercise Physiologist    Virtual Visit No    Medication changes reported     No    Fall or balance concerns reported    No    Tobacco Cessation Use Decreased   quit 06/12/23   Current number of cigarettes/nicotine per day     0    Warm-up and Cool-down Performed as group-led instruction    Resistance Training Performed Yes    VAD Patient? No    PAD/SET Patient? No      Pain Assessment   Currently in Pain? No/denies    Pain Score 0-No pain    Multiple Pain Sites No             Capillary Blood Glucose: No results found for this or any previous visit (from the past 24 hours).    Social History   Tobacco Use  Smoking Status Former   Current packs/day: 0.00   Average packs/day: 1 pack/day for 40.1 years (40.1 ttl pk-yrs)   Types: Cigarettes   Start date: 05/21/1983   Quit date: 06/12/2023   Years since quitting: 0.1   Passive exposure: Never  Smokeless Tobacco Never  Tobacco Comments   Quit 06/12/23    Goals Met:  Proper associated with RPD/PD & O2 Sat Independence with exercise equipment Exercise tolerated well No report of concerns or symptoms today Strength training completed today  Goals Unmet:  Not Applicable  Comments: Service time is from 1318 to 1453.    Dr. Mechele Collin is  Medical Director for Pulmonary Rehab at [facility].

## 2023-07-25 ENCOUNTER — Ambulatory Visit: Payer: Medicare Other | Admitting: Radiation Oncology

## 2023-07-29 ENCOUNTER — Encounter (HOSPITAL_COMMUNITY)
Admission: RE | Admit: 2023-07-29 | Discharge: 2023-07-29 | Disposition: A | Discharge status: Home / Self Care | Payer: Medicare Other | Source: Ambulatory Visit | Attending: Pulmonary Disease

## 2023-07-29 ENCOUNTER — Ambulatory Visit (HOSPITAL_COMMUNITY): Payer: Medicare Other

## 2023-07-29 DIAGNOSIS — J449 Chronic obstructive pulmonary disease, unspecified: Secondary | ICD-10-CM | POA: Diagnosis not present

## 2023-07-29 NOTE — Progress Notes (Signed)
 Daily Session Note  Patient Details  Name: Joy Williams MRN: [id number] Date of Birth: Feb 16, 1951 Referring Provider:   Doristine Devoid Pulmonary Rehab Walk Test from 07/07/2023 in [facility] for Heart, Vascular, & Lung Health  Referring Provider D'[place]ico       Encounter Date: 07/29/2023  Check In:  Session Check In - 07/29/23 1323       Check-In   Supervising physician immediately available to respond to emergencies [facility] MD immediately available    Physician(s) Tereso Newcomer, PA    Location [facility]    Staff Present Raford Pitcher, MS, ACSM-CEP, Exercise Physiologist;Tanis Burnley Erin Sons BS, ACSM-CEP, Exercise Physiologist    Virtual Visit No    Medication changes reported     No    Fall or balance concerns reported    No    Tobacco Cessation Use Decreased   quit 06/12/23   Current number of cigarettes/nicotine per day     0    Warm-up and Cool-down Performed as group-led instruction    Resistance Training Performed Yes    VAD Patient? No    PAD/SET Patient? No      Pain Assessment   Currently in Pain? No/denies    Multiple Pain Sites No             Capillary Blood Glucose: No results found for this or any previous visit (from the past 24 hours).    Social History   Tobacco Use  Smoking Status Former   Current packs/day: 0.00   Average packs/day: 1 pack/day for 40.1 years (40.1 ttl pk-yrs)   Types: Cigarettes   Start date: 05/21/1983   Quit date: 06/12/2023   Years since quitting: 0.1   Passive exposure: Never  Smokeless Tobacco Never  Tobacco Comments   Quit 06/12/23    Goals Met:  Proper associated with RPD/PD & O2 Sat Independence with exercise equipment Exercise tolerated well No report of concerns or symptoms today Strength training completed today  Goals Unmet:  Not Applicable  Comments: Service time is from 1320 to 1442.    Dr. Mechele Collin is Medical Director for Pulmonary Rehab at [facility].

## 2023-07-30 NOTE — Progress Notes (Signed)
 Discharge Progress Report  Patient Details  Name: Joy Williams MRN: [id number] Date of Birth: 03-May-1951 Referring Provider:   Doristine Devoid Pulmonary Rehab Walk Test from 07/07/2023 in [facility]  Referring Provider D'amico        Number of Visits: 6  Reason for Discharge:  Early Exit:  Pt has upcoming surgery  Smoking History:  Social History   Tobacco Use  Smoking Status Former   Current packs/day: 0.00   Average packs/day: 1 pack/day for 40.1 years (40.1 ttl pk-yrs)   Types: Cigarettes   Start date: 05/21/1983   Quit date: 06/12/2023   Years since quitting: 0.1   Passive exposure: Never  Smokeless Tobacco Never  Tobacco Comments   Quit 06/12/23    Diagnosis:  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)  ADL UCSD:  Pulmonary Assessment Scores     Row Name 07/07/23 1021 07/24/23 1537 07/29/23 1531     ADL UCSD   ADL Phase Entry Exit Exit   SOB Score total 6 7 --     CAT Score   CAT Score 11 12 --     mMRC Score   mMRC Score 2 -- 2            Initial Exercise Prescription:  Initial Exercise Prescription - 07/07/23 1100       Date of Initial Exercise RX and Referring Provider   Date 07/07/23    Referring Provider D'amico    Expected Discharge Date 07/29/23      Treadmill   MPH 2.3    Grade 0    Minutes 15    METs 3      Bike   Level 2    Minutes 15    METs 3      Prescription Details   Frequency (times per week) 2    Duration Progress to 30 minutes of continuous aerobic without signs/symptoms of physical distress      Intensity   THRR 40-80% of Max Heartrate 59-132    Ratings of Perceived Exertion 11-13    Perceived Dyspnea 0-4      Progression   Progression Continue to progress workloads to maintain intensity without signs/symptoms of physical distress.      Resistance Training   Training Prescription Yes    Weight blue bands    Reps 10-15              Discharge Exercise Prescription (Final Exercise Prescription Changes):  Exercise Prescription Changes - 07/22/23 1500       Response to Exercise   Blood Pressure (Admit) 102/60    Blood Pressure (Exercise) 122/70    Blood Pressure (Exit) 92/56    Heart Rate (Admit) 65 bpm    Heart Rate (Exercise) 82 bpm    Heart Rate (Exit) 71 bpm    Oxygen Saturation (Admit) 98 %    Oxygen Saturation (Exercise) 96 %    Oxygen Saturation (Exit) 96 %    Rating of Perceived Exertion (Exercise) 11    Perceived Dyspnea (Exercise) 0.5    Duration Continue with 30 min of aerobic exercise without signs/symptoms of physical distress.    Intensity THRR unchanged      Progression   Progression Continue to progress workloads to maintain intensity without signs/symptoms of physical distress.      Resistance Training   Training Prescription Yes    Weight blue bands    Reps 10-15  Time 10 Minutes      Treadmill   MPH 2.5    Grade 1.5    Minutes 215    METs 2.8      Bike   Level 2.5    Minutes 15    METs 3.3             Functional Capacity:  6 Minute Walk     Row Name 07/07/23 1109 07/29/23 1518       6 Minute Walk   Phase Initial Initial    Distance 1300 feet 1460 feet    Distance % Change -- 12.6 %    Distance Feet Change -- 160 ft    Walk Time 6 minutes 6 minutes    # of Rest Breaks 0 0    MPH 2.46 2.77    METS 3.1 3.17    RPE 9 11    Perceived Dyspnea  0 1    VO2 Peak 10.85 11.09    Symptoms No No    Resting HR 67 bpm 64 bpm    Resting BP 146/82 120/60    Resting Oxygen Saturation  100 % 91 %    Exercise Oxygen Saturation  during 6 min walk 95 % 91 %    Max Ex. HR 84 bpm 93 bpm    Max Ex. BP 160/80 116/74    2 Minute Post BP 170/80 114/70      Interval HR   1 Minute HR 71 74    2 Minute HR 78 85    3 Minute HR 80 86    4 Minute HR 79 85    5 Minute HR 82 92    6 Minute HR 84 93    2 Minute Post HR 68 77    Interval Heart Rate? Yes --      Interval Oxygen    Interval Oxygen? Yes --    Baseline Oxygen Saturation % 100 % 95 %    1 Minute Oxygen Saturation % 97 % 91 %    1 Minute Liters of Oxygen 0 L 0 L    2 Minute Oxygen Saturation % 95 % 93 %    2 Minute Liters of Oxygen 0 L 0 L    3 Minute Oxygen Saturation % 98 % 92 %    3 Minute Liters of Oxygen 0 L 0 L    4 Minute Oxygen Saturation % 98 % 96 %    4 Minute Liters of Oxygen 0 L 0 L    5 Minute Oxygen Saturation % 99 % 94 %    5 Minute Liters of Oxygen 0 L 0 L    6 Minute Oxygen Saturation % 98 % 96 %    6 Minute Liters of Oxygen 0 L 0 L    2 Minute Post Oxygen Saturation % 99 % 97 %    2 Minute Post Liters of Oxygen 0 L 0 L             Psychological, QOL, Others - Outcomes: PHQ 2/9:    07/24/2023    3:34 PM 07/07/2023   10:06 AM 09/04/2022    1:22 PM 03/06/2021    2:23 PM 02/13/2021    8:46 AM  Depression screen PHQ 2/9  Decreased Interest 0 0 0 0 0  Down, Depressed, Hopeless 0 0 0 0 0  PHQ - 2 Score 0 0 0 0 0  Altered sleeping 0 0  0  Tired, decreased energy 1 1  0   Change in appetite 0 0  0   Feeling bad or failure about yourself  0 0  0   Trouble concentrating 0 0  0   Moving slowly or fidgety/restless 0 0  0   Suicidal thoughts    0   PHQ-9 Score 1 1  0   Difficult doing work/chores Not difficult at all Not difficult at all       Quality of Life:   Personal Goals: Goals established at orientation with interventions provided to work toward goal.  Personal Goals and Risk Factors at Admission - 07/07/23 1016       Core Components/Risk Factors/Patient Goals on Admission   Tobacco Cessation Yes    Number of packs per day quit 06/12/23, stay quit    Improve shortness of breath with ADL's Yes    Intervention Provide education, individualized exercise plan and daily activity instruction to help decrease symptoms of SOB with activities of daily living.    Expected Outcomes Short Term: Improve cardiorespiratory fitness to achieve a reduction of symptoms when  performing ADLs;Long Term: Be able to perform more ADLs without symptoms or delay the onset of symptoms              Personal Goals Discharge:  Goals and Risk Factor Review     Row Name 07/08/23 0932 07/30/23 0818           Core Components/Risk Factors/Patient Goals Review   Personal Goals Review Improve shortness of breath with ADL's;Develop more efficient breathing techniques such as purse lipped breathing and diaphragmatic breathing and practicing self-pacing with activity. --      Review Unable to assess, Trini is scheduled to start the program today Itha graduated from the PR program on 07/29/23. She only attended 6 sessions due to upcoming surgery. Goal not met for improving shortness of breath with ADL's. Goal met for developing more efficient breathing techniques such as purse lipped breathing and diaphragmatic breathing; and practicing self-pacing with activity. Deoborah was able to demonstrate purse lip breathing when exercising. She was also able to pace herself when she would get SOB.      Expected Outcomes For Scottie to improve her shortness of breath with ADLs and develop more efficient breathing techniques such as purse lipped breathing and diaphragmatic breathing; and practicing self-pacing with activity Patient will continue to benefit from ongoing nutrition, exercise, and lifestyle modification               Exercise Goals and Review:  Exercise Goals     Row Name 07/07/23 1022             Exercise Goals   Increase Physical Activity Yes       Intervention Provide advice, education, support and counseling about physical activity/exercise needs.;Develop an individualized exercise prescription for aerobic and resistive training based on initial evaluation findings, risk stratification, comorbidities and participant's personal goals.       Expected Outcomes Short Term: Attend rehab on a regular basis to increase amount of physical activity.;Long Term: Add in  home exercise to make exercise part of routine and to increase amount of physical activity.;Long Term: Exercising regularly at least 3-5 days a week.       Increase Strength and Stamina Yes       Intervention Provide advice, education, support and counseling about physical activity/exercise needs.;Develop an individualized exercise prescription for aerobic and resistive training based on initial evaluation findings, risk  stratification, comorbidities and participant's personal goals.       Expected Outcomes Short Term: Increase workloads from initial exercise prescription for resistance, speed, and METs.;Short Term: Perform resistance training exercises routinely during rehab and add in resistance training at home;Long Term: Improve cardiorespiratory fitness, muscular endurance and strength as measured by increased METs and functional capacity ( )       Able to understand and use rate of perceived exertion (RPE) scale Yes       Intervention Provide education and explanation on how to use RPE scale       Expected Outcomes Short Term: Able to use RPE daily in rehab to express subjective intensity level;Long Term:  Able to use RPE to guide intensity level when exercising independently       Able to understand and use Dyspnea scale Yes       Intervention Provide education and explanation on how to use Dyspnea scale       Expected Outcomes Short Term: Able to use Dyspnea scale daily in rehab to express subjective sense of shortness of breath during exertion;Long Term: Able to use Dyspnea scale to guide intensity level when exercising independently       Knowledge and understanding of Target Heart Rate Range (THRR) Yes       Intervention Provide education and explanation of THRR including how the numbers were predicted and where they are located for reference       Expected Outcomes Short Term: Able to state/look up THRR;Long Term: Able to use THRR to govern intensity when exercising independently;Short  Term: Able to use daily as guideline for intensity in rehab       Understanding of Exercise Prescription Yes       Intervention Provide education, explanation, and written materials on patient's individual exercise prescription       Expected Outcomes Short Term: Able to explain program exercise prescription;Long Term: Able to explain home exercise prescription to exercise independently                Exercise Goals Re-Evaluation:  Exercise Goals Re-Evaluation     Row Name 07/08/23 0744 07/29/23 1532           Exercise Goal Re-Evaluation   Exercise Goals Review Increase Physical Activity;Able to understand and use Dyspnea scale;Understanding of Exercise Prescription;Increase Strength and Stamina;Knowledge and understanding of Target Heart Rate Range (THRR);Able to understand and use rate of perceived exertion (RPE) scale Increase Physical Activity;Able to understand and use Dyspnea scale;Understanding of Exercise Prescription;Increase Strength and Stamina;Knowledge and understanding of Target Heart Rate Range (THRR);Able to understand and use rate of perceived exertion (RPE) scale      Comments Pt to begin exercise 07/08/23. Will progress as tolerated. Pt completed 6 exercise sessions. She needed to graduate early due to the need for lung resection. She plans to get another referral for PR after she recovers. She exercised on the treadmill, speed 2.5, incline 1.5, METs 3.1. She then exercises on the scifit bike for 15 min level 2.5, METs 3.5. Tolerated well. Increased her 6 min walk test by 12.6 %, 160 ft. Her SOB, CAT, and MMRC scores did not change with the short duration. Encouraged pt to continue walking at home.      Expected Outcomes Through exercise at rehab and home, the patient will decrease shortness of breath with daily activities and feel confident in carrying out an exercise regimen at home Through exercise at rehab and home, the patient will decrease shortness of breath  with daily  activities and feel confident in carrying out an exercise regimen at home               Nutrition & Weight - Outcomes:  Pre Biometrics - 07/07/23 1112       Pre Biometrics   Grip Strength 24 kg             Post Biometrics - 07/29/23 1531        Post  Biometrics   Grip Strength 24 kg             Nutrition:   Nutrition Discharge:  Nutrition Assessments - 07/24/23 1542       MEDFICTS Scores   Post Score 64   Pt didn't return pre-form            Education Questionnaire Score:  Knowledge Questionnaire Score - 07/24/23 1536       Knowledge Questionnaire Score   Pre Score 11/18    Post Score 16/18             Goals reviewed with patient; copy given to patient.

## 2023-07-31 ENCOUNTER — Encounter (HOSPITAL_COMMUNITY): Payer: Medicare Other

## 2023-07-31 ENCOUNTER — Ambulatory Visit (HOSPITAL_COMMUNITY): Payer: Medicare Other

## 2023-07-31 DIAGNOSIS — J939 Pneumothorax, unspecified: Secondary | ICD-10-CM | POA: Diagnosis not present

## 2023-07-31 DIAGNOSIS — J439 Emphysema, unspecified: Secondary | ICD-10-CM | POA: Diagnosis not present

## 2023-07-31 DIAGNOSIS — Z880 Allergy status to penicillin: Secondary | ICD-10-CM | POA: Diagnosis not present

## 2023-07-31 DIAGNOSIS — Z4682 Encounter for fitting and adjustment of non-vascular catheter: Secondary | ICD-10-CM | POA: Diagnosis not present

## 2023-07-31 DIAGNOSIS — I358 Other nonrheumatic aortic valve disorders: Secondary | ICD-10-CM | POA: Diagnosis present

## 2023-07-31 DIAGNOSIS — J984 Other disorders of lung: Secondary | ICD-10-CM | POA: Diagnosis present

## 2023-07-31 DIAGNOSIS — C3432 Malignant neoplasm of lower lobe, left bronchus or lung: Secondary | ICD-10-CM | POA: Diagnosis not present

## 2023-07-31 DIAGNOSIS — I252 Old myocardial infarction: Secondary | ICD-10-CM | POA: Diagnosis not present

## 2023-07-31 DIAGNOSIS — Z9221 Personal history of antineoplastic chemotherapy: Secondary | ICD-10-CM | POA: Diagnosis not present

## 2023-07-31 DIAGNOSIS — Z7982 Long term (current) use of aspirin: Secondary | ICD-10-CM | POA: Diagnosis not present

## 2023-07-31 DIAGNOSIS — E871 Hypo-osmolality and hyponatremia: Secondary | ICD-10-CM | POA: Diagnosis not present

## 2023-07-31 DIAGNOSIS — J90 Pleural effusion, not elsewhere classified: Secondary | ICD-10-CM | POA: Diagnosis not present

## 2023-07-31 DIAGNOSIS — J8410 Pulmonary fibrosis, unspecified: Secondary | ICD-10-CM | POA: Diagnosis present

## 2023-07-31 DIAGNOSIS — Z8673 Personal history of transient ischemic attack (TIA), and cerebral infarction without residual deficits: Secondary | ICD-10-CM | POA: Diagnosis not present

## 2023-07-31 DIAGNOSIS — Z87891 Personal history of nicotine dependence: Secondary | ICD-10-CM | POA: Diagnosis not present

## 2023-07-31 DIAGNOSIS — I2510 Atherosclerotic heart disease of native coronary artery without angina pectoris: Secondary | ICD-10-CM | POA: Diagnosis present

## 2023-07-31 DIAGNOSIS — E039 Hypothyroidism, unspecified: Secondary | ICD-10-CM | POA: Diagnosis present

## 2023-07-31 DIAGNOSIS — C3482 Malignant neoplasm of overlapping sites of left bronchus and lung: Secondary | ICD-10-CM | POA: Diagnosis present

## 2023-07-31 DIAGNOSIS — Z902 Acquired absence of lung [part of]: Secondary | ICD-10-CM | POA: Diagnosis not present

## 2023-07-31 DIAGNOSIS — R112 Nausea with vomiting, unspecified: Secondary | ICD-10-CM | POA: Diagnosis not present

## 2023-07-31 DIAGNOSIS — J948 Other specified pleural conditions: Secondary | ICD-10-CM | POA: Diagnosis not present

## 2023-07-31 DIAGNOSIS — C3490 Malignant neoplasm of unspecified part of unspecified bronchus or lung: Secondary | ICD-10-CM | POA: Diagnosis not present

## 2023-07-31 DIAGNOSIS — Z01818 Encounter for other preprocedural examination: Secondary | ICD-10-CM | POA: Diagnosis not present

## 2023-07-31 DIAGNOSIS — Z953 Presence of xenogenic heart valve: Secondary | ICD-10-CM | POA: Diagnosis not present

## 2023-07-31 DIAGNOSIS — Z8679 Personal history of other diseases of the circulatory system: Secondary | ICD-10-CM | POA: Diagnosis not present

## 2023-07-31 DIAGNOSIS — R768 Other specified abnormal immunological findings in serum: Secondary | ICD-10-CM | POA: Diagnosis not present

## 2023-07-31 DIAGNOSIS — Z952 Presence of prosthetic heart valve: Secondary | ICD-10-CM | POA: Diagnosis not present

## 2023-07-31 DIAGNOSIS — I5032 Chronic diastolic (congestive) heart failure: Secondary | ICD-10-CM | POA: Diagnosis present

## 2023-07-31 DIAGNOSIS — M341 CR(E)ST syndrome: Secondary | ICD-10-CM | POA: Diagnosis present

## 2023-07-31 DIAGNOSIS — J449 Chronic obstructive pulmonary disease, unspecified: Secondary | ICD-10-CM | POA: Diagnosis present

## 2023-07-31 DIAGNOSIS — Z7902 Long term (current) use of antithrombotics/antiplatelets: Secondary | ICD-10-CM | POA: Diagnosis not present

## 2023-07-31 DIAGNOSIS — J9382 Other air leak: Secondary | ICD-10-CM | POA: Diagnosis not present

## 2023-07-31 DIAGNOSIS — Z9889 Other specified postprocedural states: Secondary | ICD-10-CM | POA: Diagnosis not present

## 2023-07-31 DIAGNOSIS — I7300 Raynaud's syndrome without gangrene: Secondary | ICD-10-CM | POA: Diagnosis present

## 2023-07-31 DIAGNOSIS — K5900 Constipation, unspecified: Secondary | ICD-10-CM | POA: Diagnosis not present

## 2023-08-01 DIAGNOSIS — C3482 Malignant neoplasm of overlapping sites of left bronchus and lung: Secondary | ICD-10-CM | POA: Diagnosis present

## 2023-08-01 DIAGNOSIS — Z953 Presence of xenogenic heart valve: Secondary | ICD-10-CM | POA: Diagnosis not present

## 2023-08-01 DIAGNOSIS — C3432 Malignant neoplasm of lower lobe, left bronchus or lung: Secondary | ICD-10-CM | POA: Diagnosis not present

## 2023-08-01 DIAGNOSIS — J449 Chronic obstructive pulmonary disease, unspecified: Secondary | ICD-10-CM | POA: Diagnosis present

## 2023-08-01 DIAGNOSIS — J939 Pneumothorax, unspecified: Secondary | ICD-10-CM | POA: Diagnosis not present

## 2023-08-01 DIAGNOSIS — J948 Other specified pleural conditions: Secondary | ICD-10-CM | POA: Diagnosis not present

## 2023-08-01 DIAGNOSIS — Z8679 Personal history of other diseases of the circulatory system: Secondary | ICD-10-CM | POA: Diagnosis not present

## 2023-08-01 DIAGNOSIS — J9382 Other air leak: Secondary | ICD-10-CM | POA: Diagnosis not present

## 2023-08-01 DIAGNOSIS — M341 CR(E)ST syndrome: Secondary | ICD-10-CM | POA: Diagnosis present

## 2023-08-01 DIAGNOSIS — J439 Emphysema, unspecified: Secondary | ICD-10-CM | POA: Diagnosis not present

## 2023-08-01 DIAGNOSIS — J984 Other disorders of lung: Secondary | ICD-10-CM | POA: Diagnosis present

## 2023-08-01 DIAGNOSIS — J8410 Pulmonary fibrosis, unspecified: Secondary | ICD-10-CM | POA: Diagnosis present

## 2023-08-01 DIAGNOSIS — Z7982 Long term (current) use of aspirin: Secondary | ICD-10-CM | POA: Diagnosis not present

## 2023-08-01 DIAGNOSIS — I358 Other nonrheumatic aortic valve disorders: Secondary | ICD-10-CM | POA: Diagnosis present

## 2023-08-01 DIAGNOSIS — Z87891 Personal history of nicotine dependence: Secondary | ICD-10-CM | POA: Diagnosis not present

## 2023-08-01 DIAGNOSIS — Z952 Presence of prosthetic heart valve: Secondary | ICD-10-CM | POA: Diagnosis not present

## 2023-08-01 DIAGNOSIS — I2510 Atherosclerotic heart disease of native coronary artery without angina pectoris: Secondary | ICD-10-CM | POA: Diagnosis present

## 2023-08-01 DIAGNOSIS — Z4682 Encounter for fitting and adjustment of non-vascular catheter: Secondary | ICD-10-CM | POA: Diagnosis not present

## 2023-08-01 DIAGNOSIS — J90 Pleural effusion, not elsewhere classified: Secondary | ICD-10-CM | POA: Diagnosis not present

## 2023-08-01 DIAGNOSIS — Z7902 Long term (current) use of antithrombotics/antiplatelets: Secondary | ICD-10-CM | POA: Diagnosis not present

## 2023-08-01 DIAGNOSIS — Z902 Acquired absence of lung [part of]: Secondary | ICD-10-CM | POA: Diagnosis not present

## 2023-08-01 DIAGNOSIS — Z880 Allergy status to penicillin: Secondary | ICD-10-CM | POA: Diagnosis not present

## 2023-08-01 DIAGNOSIS — K5900 Constipation, unspecified: Secondary | ICD-10-CM | POA: Diagnosis not present

## 2023-08-01 DIAGNOSIS — I252 Old myocardial infarction: Secondary | ICD-10-CM | POA: Diagnosis not present

## 2023-08-01 DIAGNOSIS — Z9221 Personal history of antineoplastic chemotherapy: Secondary | ICD-10-CM | POA: Diagnosis not present

## 2023-08-01 DIAGNOSIS — E039 Hypothyroidism, unspecified: Secondary | ICD-10-CM | POA: Diagnosis present

## 2023-08-01 DIAGNOSIS — I7300 Raynaud's syndrome without gangrene: Secondary | ICD-10-CM | POA: Diagnosis present

## 2023-08-01 DIAGNOSIS — E871 Hypo-osmolality and hyponatremia: Secondary | ICD-10-CM | POA: Diagnosis not present

## 2023-08-01 DIAGNOSIS — C3490 Malignant neoplasm of unspecified part of unspecified bronchus or lung: Secondary | ICD-10-CM | POA: Diagnosis not present

## 2023-08-01 DIAGNOSIS — R112 Nausea with vomiting, unspecified: Secondary | ICD-10-CM | POA: Diagnosis not present

## 2023-08-01 DIAGNOSIS — Z8673 Personal history of transient ischemic attack (TIA), and cerebral infarction without residual deficits: Secondary | ICD-10-CM | POA: Diagnosis not present

## 2023-08-01 DIAGNOSIS — I5032 Chronic diastolic (congestive) heart failure: Secondary | ICD-10-CM | POA: Diagnosis present

## 2023-08-05 ENCOUNTER — Ambulatory Visit (HOSPITAL_COMMUNITY): Payer: Medicare Other

## 2023-08-05 ENCOUNTER — Encounter (HOSPITAL_COMMUNITY): Payer: Medicare Other

## 2023-08-07 ENCOUNTER — Ambulatory Visit (HOSPITAL_COMMUNITY): Payer: Medicare Other

## 2023-08-07 ENCOUNTER — Encounter (HOSPITAL_COMMUNITY): Payer: Medicare Other

## 2023-08-09 DIAGNOSIS — J8410 Pulmonary fibrosis, unspecified: Secondary | ICD-10-CM | POA: Diagnosis not present

## 2023-08-10 DIAGNOSIS — J8410 Pulmonary fibrosis, unspecified: Secondary | ICD-10-CM | POA: Diagnosis not present

## 2023-08-12 ENCOUNTER — Encounter (HOSPITAL_COMMUNITY): Payer: Medicare Other

## 2023-08-12 ENCOUNTER — Ambulatory Visit (HOSPITAL_COMMUNITY): Payer: Medicare Other

## 2023-08-13 ENCOUNTER — Telehealth: Payer: Self-pay

## 2023-08-13 NOTE — Transitions of Care (Post Inpatient/ED Visit) (Signed)
 08/13/2023  Name: Joy Williams MRN: [id number] DOB: 1951-02-13  Today's TOC FU Call Status: Today's TOC FU Call Status:: Successful TOC FU Call Completed TOC FU Call Complete Date: 08/13/23 Patient's Name and Date of Birth confirmed.  Transition Care Management Follow-up Telephone Call Date of Discharge: 08/12/23 Discharge Facility: Other ([facility]) Name of Other ([facility]) Discharge Facility: [facility] Type of Discharge: Inpatient Admission Primary Inpatient Discharge Diagnosis:: Lung Cancer How have you been since you were released from the hospital?: Better (Reports happy to be home.  Feeling stonger. Repotrs cancer free.) Any questions or concerns?: No  Items Reviewed: Did you receive and understand the discharge instructions provided?: Yes Medications obtained,verified, and reconciled?: Yes (Medications Reviewed) Any new allergies since your discharge?: No Dietary orders reviewed?: Yes Type of Diet Ordered:: regular diet Do you have support at home?: Yes People in Home: child(ren), adult Name of Support/Comfort Primary Source: Judeth Cornfield  Medications Reviewed Today: Medications Reviewed Today     Reviewed by Earlie Server, RN (Registered Nurse) on 08/13/23 at 1210  Med List Status: <None>   Medication Order Taking? Sig Documenting Provider Last Dose Status Informant  acetaminophen (TYLENOL) 325 MG tablet [id number] Yes Take 650 mg by mouth every 6 (six) hours as needed. [provider] Taking Active   acetaminophen (TYLENOL) 500 MG tablet [id number] No Take 1,000 mg by mouth every 6 (six) hours as needed for mild pain or headache.  Patient not taking: Reported on 08/13/2023   [provider] Not Taking Active Self  buPROPion [facility] SR) 150 MG 12 hr tablet [id number] No TAKE 1 TABLET BY MOUTH TWICE A DAY  Patient not taking: Reported on 06/26/2023   Josephine Igo, DO Not Taking Active   buPROPion [facility] SR) 150 MG 12 hr tablet [id number]  No Take 150mg  once a day for 3 days, increase to 150mg  twice a day  Patient not taking: Reported on 06/26/2023   Kalman Shan, MD Not Taking Active   Cholecalciferol (VITAMIN D) 125 MCG (5000 UT) CAPS [id number] Yes Take 5,000 Units by mouth daily. [provider] Taking Active Self  clopidogrel (PLAVIX) 75 MG tablet [id number] Yes Take 1 tablet (75 mg total) by mouth daily. Parke Poisson, MD Taking Active   EPINEPHrine (EPIPEN 2-PAK) 0.3 mg/0.3 mL IJ SOAJ injection [id number] No Inject 0.3 mg into the muscle as needed for anaphylaxis.  Patient not taking: Reported on 08/13/2023   Carlean Jews, NP Not Taking Active Self  EPINEPHrine 0.3 mg/0.3 mL IJ SOAJ injection [id number] No Inject 0.3 mg into the muscle as needed.  Patient not taking: Reported on 08/13/2023   [provider] Not Taking Active   ferrous sulfate 325 (65 FE) MG tablet [id number] Yes Take 1 tablet (325 mg total) by mouth daily.  Patient taking differently: Take 325 mg by mouth daily. OTC   Parke Poisson, MD Taking Active   Fexofenadine HCl [facility] ALLERGY PO) [id number] No Take 1 tablet by mouth daily.  Patient not taking: Reported on 08/13/2023   [provider] Not Taking Active   fluticasone (FLONASE) 50 MCG/ACT nasal spray [id number] No Place 1 spray into both nostrils daily.  Patient not taking: Reported on 08/13/2023   [provider] Not Taking Active   Fluticasone-Umeclidin-Vilant (TRELEGY ELLIPTA) 100-62.5-25 MCG/ACT AEPB [id number] Yes INHALE 1 PUFF BY MOUTH EVERY DAY Kalman Shan, MD Taking Active   Fluticasone-Umeclidin-Vilant (TRELEGY ELLIPTA) 100-62.5-25 MCG/ACT AEPB [id number] Yes Inhale 1 puff into the lungs daily. [provider] Taking Active   gabapentin (NEURONTIN) 100 MG capsule [id number] Yes Take 100 mg by mouth 2 (two) times daily. [provider] Taking Active   hydrALAZINE (APRESOLINE) 25 MG tablet [id number] No Take 1 tablet (25 mg total)  by mouth daily as needed (for systolic BP>150).  Patient not taking: Reported on 08/13/2023   [name], MD Not Taking Active   ibuprofen (ADVIL) 400 MG tablet [id number] Yes Take 400 mg by mouth every 6 (six) hours as needed. [provider] Taking Active   levothyroxine (SYNTHROID) 150 MCG tablet [id number] Yes Take 1 tablet (150 mcg total) by mouth daily. Melida Quitter, PA Taking Active   MELATONIN PO [id number] Yes Take 1 tablet by mouth at bedtime as needed (sleep). [provider] Taking Active Self           Med Note Ernest Mallick, MINDY L   Wed Nov 15, 2020  1:13 PM)    methocarbamol (ROBAXIN) 500 MG tablet [id number] Yes Take 500 mg by mouth every 8 (eight) hours as needed. [provider] Taking Active   Multiple Vitamins-Minerals (MULTIVITAMIN WITH MINERALS) tablet [id number] Yes Take 1 tablet by mouth daily. [provider] Taking Active Self  nicotine polacrilex (NICORETTE) 2 MG gum [id number] No Take 2 mg by mouth as needed.  Patient not taking: Reported on 08/13/2023   [provider] Not Taking Active   nitroGLYCERIN (NITROSTAT) 0.4 MG SL tablet [id number] No Place 0.4 mg under the tongue every 5 (five) minutes as needed.  Patient not taking: Reported on 08/13/2023   [provider] Not Taking Active   polyethylene glycol (MIRALAX / GLYCOLAX) 17 g packet [id number] Yes Take 17 g by mouth daily as needed for mild constipation. [provider] Taking Active   rosuvastatin (CRESTOR) 20 MG tablet [id number] Yes Take 1 tablet (20 mg total) by mouth every evening. [name], MD Taking Active   rosuvastatin (CRESTOR) 20 MG tablet [id number] Yes Take 1 tablet by mouth daily. [provider] Taking Active   senna-docusate (SENOKOT-S) 8.6-50 MG tablet [id number] Yes Take 2 tablets by mouth. [provider] Taking Active   Turmeric 500 MG CAPS [id number] Yes Take 500 mg by mouth daily. [provider] Taking Active Self  vitamin B-12 (CYANOCOBALAMIN) 1000 MCG tablet [id number] Yes Take 1 tablet (1,000 mcg total) by mouth daily. Joseph Art, DO Taking Active Self  Zinc 30 MG TABS [id number] Yes Take 30 mg by mouth. [provider] Taking Active             Home Care and Equipment/Supplies: Were Home Health Services Ordered?: Yes Name of Home Health Agency:: [facility] Has Agency set up a time to come to your home?: No EMR reviewed for Home Health Orders:  (spoke with Marylene Land at [facility] who confirms that they have the orders and someone will reach out to patient later today, patient informed.) Any new equipment or medical supplies ordered?: Yes (rollator) Name of Medical supply agency?: [facility] Were you able to get the equipment/medical supplies?: Yes Do you have any questions related to the use of the equipment/supplies?: No  Functional Questionnaire: Do you need assistance with bathing/showering or dressing?: No Do you need assistance with meal preparation?: No Do you need assistance with eating?: No Do you have difficulty maintaining continence: No Do you need assistance with getting out of bed/getting out of a chair/moving?: No Do you have difficulty managing or taking your medications?: No  Follow  up appointments reviewed: PCP Follow-up appointment confirmed?: Yes MD Provider Line Number:[phone] Given: No Date of PCP follow-up appointment?: 08/26/23 Follow-up Provider: Dr. Constance Goltz Specialist [facility] Follow-up appointment confirmed?: Yes Date of Specialist follow-up appointment?: 08/21/23 Follow-Up Specialty Provider:: [facility] Do you need transportation to your follow-up appointment?: No Do you understand care options if your condition(s) worsen?: Yes-patient verbalized understanding  SDOH Interventions Today    Flowsheet Row Most Recent Value  SDOH Interventions   Food Insecurity Interventions Intervention Not Indicated  Housing Interventions Intervention Not  Indicated  Transportation Interventions Intervention Not Indicated  Utilities Interventions Intervention Not Indicated      Interventions Today    Flowsheet Row Most Recent Value  Chronic Disease   Chronic disease during today's visit Other  [lung surgery]  General Interventions   General Interventions Discussed/Reviewed General Interventions Discussed, General Interventions Reviewed, Durable Medical Equipment (DME), Communication with  Ivy Lynn, home health, and MD office]  Communication with PCP/Specialists, Pharmacists  Exercise Interventions   Exercise Discussed/Reviewed Exercise Reviewed  Education Interventions   Education Provided Provided Education  [patient planning to call surgeon to inquire about dressing changes. Discharge instruction state no dressing but patient has been keeping covered. No drainage from incision confirmed.]  Provided Verbal Education On Nutrition, Medication, Exercise, When to see the doctor  Nutrition Interventions   Nutrition Discussed/Reviewed Nutrition Discussed  Pharmacy Interventions   Pharmacy Dicussed/Reviewed Medications and their functions  [call to pharmacy about tramadol]      TOC Interventions Today    Flowsheet Row Most Recent Value  TOC Interventions   TOC Interventions Discussed/Reviewed TOC Interventions Discussed, TOC Interventions Reviewed, Contacted Home Health RN/OT/PT, Arranged PCP follow up less than 12 days/Care Guide scheduled, S/S of infection, Post op wound/incision care       Patient reports that she is doing well. Has good family support.  Care guides unable to get a follow up appointment for follow up until May.  I spoke with Morrie Sheldon at MD office and patient was able to get scheduled for 08/26/2023.  Patient informed.  Placed call to [facility] and confirmed order was received and patient will get call later today.  Placed call to [facility] and spoke with pharmacist who states that tramadol was denied by insurance  but prescription is being filled at cash price for 13.99. Patient informed.   Reviewed 30 day TOC program and patient declined. Provided my contact information if patient needs to call me back.   Lonia Chimera, RN, BSN, CEN [employer]- Transition of Care Team.  [facility] [phone]

## 2023-08-14 ENCOUNTER — Telehealth: Payer: Self-pay

## 2023-08-14 ENCOUNTER — Ambulatory Visit (HOSPITAL_COMMUNITY): Payer: Medicare Other

## 2023-08-14 ENCOUNTER — Encounter (HOSPITAL_COMMUNITY): Payer: Medicare Other

## 2023-08-14 ENCOUNTER — Telehealth: Payer: Self-pay | Admitting: *Deleted

## 2023-08-14 NOTE — Patient Outreach (Signed)
 Care Coordination   Follow Up Visit Note   08/14/2023 Name: Joy Williams MRN: [id number] DOB: [birth year]  Joy Williams is a 73 y.o. year old female who sees Melida Quitter, [place] for primary care. I spoke with  Jordan Likes by phone today.  What matters to the patients health and wellness today?  Patient called PCP office trying to reach me. Reports she needed my help because she could not take tramadol.  Reports that she has spoken with her MD and medications will be changed to oxycodone.  Patient had already problem solved and denies any additional needs.  Patient was again provided my contact information.     SDOH assessments and interventions completed:  No     Care Coordination Interventions:  No, not indicated   Follow up plan: No further intervention required.   Encounter Outcome:  Patient Visit Completed   Lonia Chimera, RN, BSN, CEN Population Health- Transition of Care Team.  [facility] [phone]

## 2023-08-14 NOTE — Telephone Encounter (Signed)
 Copied from CRM [id number]. Topic: General - Other >> Aug 14, 2023 12:10 PM Geroge Baseman wrote: Reason for CRM: Patient is wanting to contact her case manager amanda and does not have her information, she wanted to follow up about medication ASAP. Please advise on how she can contact amanda, thank you

## 2023-08-17 ENCOUNTER — Emergency Department (HOSPITAL_COMMUNITY)

## 2023-08-17 ENCOUNTER — Inpatient Hospital Stay (HOSPITAL_COMMUNITY)
Admission: EM | Admit: 2023-08-17 | Discharge: 2023-08-19 | DRG: 641 | Disposition: A | Discharge status: Home / Self Care | Attending: Internal Medicine | Admitting: Internal Medicine

## 2023-08-17 ENCOUNTER — Other Ambulatory Visit: Payer: Self-pay

## 2023-08-17 ENCOUNTER — Inpatient Hospital Stay (HOSPITAL_COMMUNITY)

## 2023-08-17 DIAGNOSIS — Z1152 Encounter for screening for COVID-19: Secondary | ICD-10-CM

## 2023-08-17 DIAGNOSIS — R531 Weakness: Principal | ICD-10-CM | POA: Diagnosis present

## 2023-08-17 DIAGNOSIS — Z888 Allergy status to other drugs, medicaments and biological substances status: Secondary | ICD-10-CM | POA: Diagnosis not present

## 2023-08-17 DIAGNOSIS — E860 Dehydration: Secondary | ICD-10-CM | POA: Diagnosis present

## 2023-08-17 DIAGNOSIS — Z953 Presence of xenogenic heart valve: Secondary | ICD-10-CM

## 2023-08-17 DIAGNOSIS — E876 Hypokalemia: Secondary | ICD-10-CM | POA: Diagnosis present

## 2023-08-17 DIAGNOSIS — E039 Hypothyroidism, unspecified: Secondary | ICD-10-CM | POA: Diagnosis present

## 2023-08-17 DIAGNOSIS — Z91030 Bee allergy status: Secondary | ICD-10-CM

## 2023-08-17 DIAGNOSIS — E785 Hyperlipidemia, unspecified: Secondary | ICD-10-CM | POA: Diagnosis present

## 2023-08-17 DIAGNOSIS — Z7951 Long term (current) use of inhaled steroids: Secondary | ICD-10-CM | POA: Diagnosis not present

## 2023-08-17 DIAGNOSIS — I5032 Chronic diastolic (congestive) heart failure: Secondary | ICD-10-CM | POA: Diagnosis not present

## 2023-08-17 DIAGNOSIS — I252 Old myocardial infarction: Secondary | ICD-10-CM | POA: Diagnosis not present

## 2023-08-17 DIAGNOSIS — R112 Nausea with vomiting, unspecified: Secondary | ICD-10-CM | POA: Diagnosis not present

## 2023-08-17 DIAGNOSIS — K5900 Constipation, unspecified: Secondary | ICD-10-CM | POA: Diagnosis present

## 2023-08-17 DIAGNOSIS — Z818 Family history of other mental and behavioral disorders: Secondary | ICD-10-CM | POA: Diagnosis not present

## 2023-08-17 DIAGNOSIS — Z8249 Family history of ischemic heart disease and other diseases of the circulatory system: Secondary | ICD-10-CM | POA: Diagnosis not present

## 2023-08-17 DIAGNOSIS — R110 Nausea: Secondary | ICD-10-CM | POA: Diagnosis present

## 2023-08-17 DIAGNOSIS — Z79890 Hormone replacement therapy (postmenopausal): Secondary | ICD-10-CM

## 2023-08-17 DIAGNOSIS — Z880 Allergy status to penicillin: Secondary | ICD-10-CM

## 2023-08-17 DIAGNOSIS — T797XXA Traumatic subcutaneous emphysema, initial encounter: Secondary | ICD-10-CM | POA: Diagnosis present

## 2023-08-17 DIAGNOSIS — Z7902 Long term (current) use of antithrombotics/antiplatelets: Secondary | ICD-10-CM

## 2023-08-17 DIAGNOSIS — J939 Pneumothorax, unspecified: Secondary | ICD-10-CM | POA: Diagnosis present

## 2023-08-17 DIAGNOSIS — Z8042 Family history of malignant neoplasm of prostate: Secondary | ICD-10-CM

## 2023-08-17 DIAGNOSIS — E871 Hypo-osmolality and hyponatremia: Principal | ICD-10-CM | POA: Diagnosis present

## 2023-08-17 DIAGNOSIS — R42 Dizziness and giddiness: Secondary | ICD-10-CM | POA: Diagnosis not present

## 2023-08-17 DIAGNOSIS — E861 Hypovolemia: Secondary | ICD-10-CM | POA: Diagnosis not present

## 2023-08-17 DIAGNOSIS — R627 Adult failure to thrive: Secondary | ICD-10-CM | POA: Diagnosis not present

## 2023-08-17 DIAGNOSIS — J449 Chronic obstructive pulmonary disease, unspecified: Secondary | ICD-10-CM | POA: Diagnosis present

## 2023-08-17 DIAGNOSIS — R9431 Abnormal electrocardiogram [ECG] [EKG]: Secondary | ICD-10-CM | POA: Diagnosis present

## 2023-08-17 DIAGNOSIS — Z87891 Personal history of nicotine dependence: Secondary | ICD-10-CM

## 2023-08-17 DIAGNOSIS — R6251 Failure to thrive (child): Secondary | ICD-10-CM

## 2023-08-17 DIAGNOSIS — C3490 Malignant neoplasm of unspecified part of unspecified bronchus or lung: Secondary | ICD-10-CM | POA: Diagnosis not present

## 2023-08-17 DIAGNOSIS — R1111 Vomiting without nausea: Secondary | ICD-10-CM | POA: Diagnosis not present

## 2023-08-17 DIAGNOSIS — R233 Spontaneous ecchymoses: Secondary | ICD-10-CM | POA: Diagnosis present

## 2023-08-17 DIAGNOSIS — Z85118 Personal history of other malignant neoplasm of bronchus and lung: Secondary | ICD-10-CM

## 2023-08-17 DIAGNOSIS — Z79899 Other long term (current) drug therapy: Secondary | ICD-10-CM

## 2023-08-17 LAB — URINALYSIS, ROUTINE W REFLEX MICROSCOPIC
Bilirubin Urine: NEGATIVE
Glucose, UA: NEGATIVE mg/dL
Hgb urine dipstick: NEGATIVE
Ketones, ur: NEGATIVE mg/dL
Leukocytes,Ua: NEGATIVE
Nitrite: NEGATIVE
Protein, ur: NEGATIVE mg/dL
Specific Gravity, Urine: 1.011 (ref 1.005–1.030)
pH: 7 (ref 5.0–8.0)

## 2023-08-17 LAB — CBC WITH DIFFERENTIAL/PLATELET
Abs Immature Granulocytes: 0.32 10*3/uL — ABNORMAL HIGH (ref 0.00–0.07)
Basophils Absolute: 0 10*3/uL (ref 0.0–0.1)
Basophils Relative: 0 %
Eosinophils Absolute: 0.2 10*3/uL (ref 0.0–0.5)
Eosinophils Relative: 2 %
HCT: 31 % — ABNORMAL LOW (ref 36.0–46.0)
Hemoglobin: 10 g/dL — ABNORMAL LOW (ref 12.0–15.0)
Immature Granulocytes: 3 %
Lymphocytes Relative: 9 %
Lymphs Abs: 1 10*3/uL (ref 0.7–4.0)
MCH: 28.8 pg (ref 26.0–34.0)
MCHC: 32.3 g/dL (ref 30.0–36.0)
MCV: 89.3 fL (ref 80.0–100.0)
Monocytes Absolute: 0.8 10*3/uL (ref 0.1–1.0)
Monocytes Relative: 7 %
Neutro Abs: 8.7 10*3/uL — ABNORMAL HIGH (ref 1.7–7.7)
Neutrophils Relative %: 79 %
Platelets: 287 10*3/uL (ref 150–400)
RBC: 3.47 MIL/uL — ABNORMAL LOW (ref 3.87–5.11)
RDW: 13.2 % (ref 11.5–15.5)
WBC: 11 10*3/uL — ABNORMAL HIGH (ref 4.0–10.5)
nRBC: 0 % (ref 0.0–0.2)

## 2023-08-17 LAB — I-STAT CHEM 8, ED
BUN: 6 mg/dL — ABNORMAL LOW (ref 8–23)
Calcium, Ion: 1.17 mmol/L (ref 1.15–1.40)
Chloride: 82 mmol/L — ABNORMAL LOW (ref 98–111)
Creatinine, Ser: 0.6 mg/dL (ref 0.44–1.00)
Glucose, Bld: 99 mg/dL (ref 70–99)
HCT: 32 % — ABNORMAL LOW (ref 36.0–46.0)
Hemoglobin: 10.9 g/dL — ABNORMAL LOW (ref 12.0–15.0)
Potassium: 3.4 mmol/L — ABNORMAL LOW (ref 3.5–5.1)
Sodium: 122 mmol/L — ABNORMAL LOW (ref 135–145)
TCO2: 29 mmol/L (ref 22–32)

## 2023-08-17 LAB — COMPREHENSIVE METABOLIC PANEL WITH GFR
ALT: 33 U/L (ref 0–44)
AST: 31 U/L (ref 15–41)
Albumin: 3.3 g/dL — ABNORMAL LOW (ref 3.5–5.0)
Alkaline Phosphatase: 58 U/L (ref 38–126)
Anion gap: 9 (ref 5–15)
BUN: 8 mg/dL (ref 8–23)
CO2: 29 mmol/L (ref 22–32)
Calcium: 9 mg/dL (ref 8.9–10.3)
Chloride: 87 mmol/L — ABNORMAL LOW (ref 98–111)
Creatinine, Ser: 0.5 mg/dL (ref 0.44–1.00)
GFR, Estimated: >60 mL/min (ref >60)
Glucose, Bld: 104 mg/dL — ABNORMAL HIGH (ref 70–99)
Potassium: 3.2 mmol/L — ABNORMAL LOW (ref 3.5–5.1)
Sodium: 125 mmol/L — ABNORMAL LOW (ref 135–145)
Total Bilirubin: 0.7 mg/dL (ref 0.0–1.2)
Total Protein: 6.2 g/dL — ABNORMAL LOW (ref 6.5–8.1)

## 2023-08-17 LAB — LIPASE, BLOOD: Lipase: 28 U/L (ref 11–51)

## 2023-08-17 LAB — BASIC METABOLIC PANEL WITH GFR
Anion gap: 11 (ref 5–15)
BUN: 8 mg/dL (ref 8–23)
CO2: 28 mmol/L (ref 22–32)
Calcium: 8.7 mg/dL — ABNORMAL LOW (ref 8.9–10.3)
Chloride: 88 mmol/L — ABNORMAL LOW (ref 98–111)
Creatinine, Ser: 0.49 mg/dL (ref 0.44–1.00)
GFR, Estimated: >60 mL/min (ref >60)
Glucose, Bld: 103 mg/dL — ABNORMAL HIGH (ref 70–99)
Potassium: 3.2 mmol/L — ABNORMAL LOW (ref 3.5–5.1)
Sodium: 127 mmol/L — ABNORMAL LOW (ref 135–145)

## 2023-08-17 LAB — TROPONIN I (HIGH SENSITIVITY)
Troponin I (High Sensitivity): 17 ng/L (ref <18)
Troponin I (High Sensitivity): 17 ng/L (ref <18)

## 2023-08-17 LAB — RESP PANEL BY RT-PCR (RSV, FLU A&B, COVID)  RVPGX2
Influenza A by PCR: NEGATIVE
Influenza B by PCR: NEGATIVE
Resp Syncytial Virus by PCR: NEGATIVE
SARS Coronavirus 2 by RT PCR: NEGATIVE

## 2023-08-17 LAB — I-STAT CG4 LACTIC ACID, ED: Lactic Acid, Venous: 0.6 mmol/L (ref 0.5–1.9)

## 2023-08-17 MED ORDER — OXYCODONE HCL 5 MG PO TABS: 2.5000 mg | ORAL_TABLET | ORAL | Status: DC | PRN

## 2023-08-17 MED ORDER — ENOXAPARIN SODIUM 40 MG/0.4ML IJ SOSY
40.0000 mg | PREFILLED_SYRINGE | INTRAMUSCULAR | Status: DC
  Administered 2023-08-17 – 2023-08-18 (×2): 40 mg via SUBCUTANEOUS
  Filled 2023-08-17 (×2): qty 0.4

## 2023-08-17 MED ORDER — SODIUM CHLORIDE 0.9 % IV SOLN
INTRAVENOUS | Status: AC
Start: 2023-08-17 — End: 2023-08-18

## 2023-08-17 MED ORDER — LACTATED RINGERS IV BOLUS
1000.0000 mL | Freq: Once | INTRAVENOUS | Status: AC
  Administered 2023-08-17: 1000 mL via INTRAVENOUS

## 2023-08-17 MED ORDER — LEVOTHYROXINE SODIUM 150 MCG PO TABS
150.0000 ug | ORAL_TABLET | Freq: Every day | ORAL | Status: DC
  Administered 2023-08-18 – 2023-08-19 (×2): 150 ug via ORAL
  Filled 2023-08-17 (×2): qty 1

## 2023-08-17 MED ORDER — TRIMETHOBENZAMIDE HCL 100 MG/ML IM SOLN
200.0000 mg | Freq: Once | INTRAMUSCULAR | Status: AC
  Administered 2023-08-17: 200 mg via INTRAMUSCULAR
  Filled 2023-08-17: qty 2

## 2023-08-17 MED ORDER — FLUTICASONE FUROATE-VILANTEROL 100-25 MCG/ACT IN AEPB
1.0000 | INHALATION_SPRAY | Freq: Every day | RESPIRATORY_TRACT | Status: DC
  Administered 2023-08-18 – 2023-08-19 (×2): 1 via RESPIRATORY_TRACT
  Filled 2023-08-17: qty 28

## 2023-08-17 MED ORDER — OXYCODONE HCL 5 MG PO TABS
2.5000 mg | ORAL_TABLET | Freq: Four times a day (QID) | ORAL | Status: DC | PRN
  Filled 2023-08-17: qty 1

## 2023-08-17 MED ORDER — SENNA-DOCUSATE SODIUM 8.6-50 MG PO TABS: 1.0000 | ORAL_TABLET | Freq: Every day | ORAL | Status: DC

## 2023-08-17 MED ORDER — METHOCARBAMOL 500 MG PO TABS
500.0000 mg | ORAL_TABLET | Freq: Four times a day (QID) | ORAL | Status: DC
  Administered 2023-08-17 – 2023-08-19 (×7): 500 mg via ORAL
  Filled 2023-08-17 (×7): qty 1

## 2023-08-17 MED ORDER — UMECLIDINIUM BROMIDE 62.5 MCG/ACT IN AEPB
1.0000 | INHALATION_SPRAY | Freq: Every day | RESPIRATORY_TRACT | Status: DC
  Administered 2023-08-18 – 2023-08-19 (×2): 1 via RESPIRATORY_TRACT
  Filled 2023-08-17: qty 7

## 2023-08-17 MED ORDER — ACETAMINOPHEN 650 MG RE SUPP: 650.0000 mg | Freq: Four times a day (QID) | RECTAL | Status: DC | PRN

## 2023-08-17 MED ORDER — SENNOSIDES-DOCUSATE SODIUM 8.6-50 MG PO TABS: 1.0000 | ORAL_TABLET | Freq: Every day | ORAL | Status: DC

## 2023-08-17 MED ORDER — POLYETHYLENE GLYCOL 3350 17 G PO PACK
17.0000 g | PACK | Freq: Two times a day (BID) | ORAL | Status: DC
  Administered 2023-08-17 – 2023-08-18 (×3): 17 g via ORAL
  Filled 2023-08-17 (×3): qty 1

## 2023-08-17 MED ORDER — POTASSIUM CHLORIDE CRYS ER 20 MEQ PO TBCR
40.0000 meq | EXTENDED_RELEASE_TABLET | Freq: Once | ORAL | Status: AC
  Administered 2023-08-17: 40 meq via ORAL
  Filled 2023-08-17: qty 2

## 2023-08-17 MED ORDER — VITAMIN B-12 1000 MCG PO TABS
1000.0000 ug | ORAL_TABLET | Freq: Every day | ORAL | Status: DC
  Administered 2023-08-17 – 2023-08-19 (×3): 1000 ug via ORAL
  Filled 2023-08-17 (×3): qty 1

## 2023-08-17 MED ORDER — ROSUVASTATIN CALCIUM 10 MG PO TABS
20.0000 mg | ORAL_TABLET | Freq: Every evening | ORAL | Status: DC
  Administered 2023-08-17 – 2023-08-18 (×2): 20 mg via ORAL
  Filled 2023-08-17 (×2): qty 2

## 2023-08-17 MED ORDER — FERROUS SULFATE 325 (65 FE) MG PO TABS
325.0000 mg | ORAL_TABLET | Freq: Every day | ORAL | Status: DC
  Administered 2023-08-18 – 2023-08-19 (×2): 325 mg via ORAL
  Filled 2023-08-17 (×2): qty 1

## 2023-08-17 MED ORDER — FENTANYL CITRATE PF 50 MCG/ML IJ SOSY
50.0000 ug | PREFILLED_SYRINGE | Freq: Once | INTRAMUSCULAR | Status: AC
  Administered 2023-08-17: 50 ug via INTRAVENOUS
  Filled 2023-08-17: qty 1

## 2023-08-17 MED ORDER — CLOPIDOGREL BISULFATE 75 MG PO TABS
75.0000 mg | ORAL_TABLET | Freq: Every day | ORAL | Status: DC
  Administered 2023-08-17 – 2023-08-19 (×3): 75 mg via ORAL
  Filled 2023-08-17 (×3): qty 1

## 2023-08-17 MED ORDER — ACETAMINOPHEN 325 MG PO TABS
650.0000 mg | ORAL_TABLET | Freq: Four times a day (QID) | ORAL | Status: DC | PRN
  Administered 2023-08-17 – 2023-08-18 (×2): 650 mg via ORAL
  Filled 2023-08-17 (×2): qty 2

## 2023-08-17 MED ORDER — FLUTICASONE PROPIONATE 50 MCG/ACT NA SUSP
1.0000 | Freq: Every day | NASAL | Status: DC
  Administered 2023-08-18 – 2023-08-19 (×2): 1 via NASAL
  Filled 2023-08-17: qty 16

## 2023-08-17 MED ORDER — MORPHINE SULFATE (PF) 2 MG/ML IV SOLN
1.0000 mg | INTRAVENOUS | Status: DC | PRN
  Administered 2023-08-17 – 2023-08-18 (×4): 1 mg via INTRAVENOUS
  Filled 2023-08-17 (×4): qty 1

## 2023-08-17 MED ORDER — OXYCODONE HCL 5 MG PO TABS: 5.0000 mg | ORAL_TABLET | Freq: Four times a day (QID) | ORAL | Status: DC | PRN

## 2023-08-17 MED ORDER — SENNOSIDES-DOCUSATE SODIUM 8.6-50 MG PO TABS
2.0000 | ORAL_TABLET | Freq: Two times a day (BID) | ORAL | Status: DC
  Administered 2023-08-18 – 2023-08-19 (×3): 2 via ORAL
  Filled 2023-08-17 (×3): qty 2

## 2023-08-17 MED ORDER — GABAPENTIN 100 MG PO CAPS
100.0000 mg | ORAL_CAPSULE | Freq: Two times a day (BID) | ORAL | Status: DC
  Administered 2023-08-17 – 2023-08-18 (×3): 100 mg via ORAL
  Filled 2023-08-17 (×3): qty 1

## 2023-08-17 MED ORDER — ONDANSETRON HCL 4 MG/2ML IJ SOLN
4.0000 mg | Freq: Once | INTRAMUSCULAR | Status: DC
  Filled 2023-08-17: qty 2

## 2023-08-17 NOTE — ED Provider Notes (Incomplete)
  EMERGENCY DEPARTMENT AT [facility] Provider Note   CSN: [id number] Arrival date & time: 08/17/23  1035     History {Add pertinent medical, surgical, social history, OB history to HPI:1} No chief complaint on file.   Joy Williams is a 73 y.o. 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  She was discharged home on 08/12/2023.  She reports that since that time she has been extremely fatigued, nauseous, no appetite.  She lives alone with her dog but has had some intermittent help from her son.  She called EMS today due to severe fatigue and weakness.  She is feeling lightheaded.  Notably the patient has petechiae involving face arms chest palms and feet. She states that this is chronic and she does not have any abnormal bleeding otherwise   HPI     Home Medications Prior to Admission medications   Medication Sig Start Date End Date Taking? Authorizing Provider  ondansetron (ZOFRAN-ODT) 4 MG disintegrating tablet Take 4 mg by mouth every 8 (eight) hours as needed for nausea or vomiting. 08/14/23 08/21/23 Yes [provider]  oxyCODONE (OXY IR/ROXICODONE) 5 MG immediate release tablet Take 5 mg by mouth every 6 (six) hours as needed for severe pain (pain score 7-10). 08/14/23 08/19/23 Yes [provider]  acetaminophen (TYLENOL) 325 MG tablet Take 650 mg by mouth every 6 (six) hours as needed. 08/06/23 08/20/23  [provider]  acetaminophen (TYLENOL) 500 MG tablet Take 1,000 mg by mouth every 6 (six) hours as needed for mild pain or headache. Patient not taking: Reported on 08/13/2023    [provider]  buPROPion (WELLBUTRIN SR) 150 MG 12 hr tablet TAKE 1 TABLET BY MOUTH TWICE A DAY Patient not taking: Reported on 06/26/2023 02/10/23   Josephine Igo, DO  buPROPion  [facility] SR) 150 MG 12 hr tablet Take 150mg  once a day for 3 days, increase to 150mg  twice a day Patient not taking: Reported on 06/26/2023 03/04/23   Kalman Shan, MD  Cholecalciferol (VITAMIN D) 125 MCG (5000 UT) CAPS Take 5,000 Units by mouth daily.    [provider]  clopidogrel (PLAVIX) 75 MG tablet Take 1 tablet (75 mg total) by mouth daily. 04/08/23   Parke Poisson, MD  EPINEPHrine (EPIPEN 2-PAK) 0.3 mg/0.3 mL IJ SOAJ injection Inject 0.3 mg into the muscle as needed for anaphylaxis. Patient not taking: Reported on 08/13/2023 12/13/20   Carlean Jews, NP  EPINEPHrine 0.3 mg/0.3 mL IJ SOAJ injection Inject 0.3 mg into the muscle as needed. Patient not taking: Reported on 08/13/2023 12/13/20   [provider]  ferrous sulfate 325 (65 FE) MG tablet Take 1 tablet (325 mg total) by mouth daily. Patient taking differently: Take 325 mg by mouth daily. OTC 02/11/23   Parke Poisson, MD  Fexofenadine HCl [facility] PO) Take 1 tablet by mouth daily. Patient not taking: Reported on 08/13/2023    [provider]  fluticasone (FLONASE) 50 MCG/ACT nasal spray Place 1 spray into both nostrils daily. Patient not taking: Reported on 08/13/2023    [provider]  Fluticasone-Umeclidin-Vilant (TRELEGY ELLIPTA) 100-62.5-25 MCG/ACT AEPB INHALE 1 PUFF BY MOUTH EVERY DAY 04/08/23   Kalman Shan, MD  Fluticasone-Umeclidin-Vilant (TRELEGY ELLIPTA) 100-62.5-25 MCG/ACT AEPB Inhale 1 puff into the lungs daily.    [provider]  gabapentin (NEURONTIN) 100 MG capsule Take 100 mg by mouth 2 (two) times daily. 08/06/23 08/20/23  [provider]  hydrALAZINE (APRESOLINE) 25 MG tablet Take 1 tablet (25 mg total) by mouth daily as needed (for systolic BP>150). Patient not taking: Reported on 08/13/2023 06/19/22   [name], MD  ibuprofen (ADVIL) 400 MG tablet Take 400 mg by mouth every 6 (six) hours as needed. 08/06/23 08/20/23  [provider]  levothyroxine (SYNTHROID) 150 MCG tablet Take 1 tablet (150 mcg total) by mouth daily. 03/04/23   Saralyn Pilar A, PA  MELATONIN PO Take 1 tablet by mouth at bedtime as needed (sleep).    [provider]  Multiple Vitamins-Minerals (MULTIVITAMIN WITH MINERALS) tablet Take 1 tablet by mouth daily.    [provider]  nicotine polacrilex (NICORETTE) 2 MG gum Take 2 mg by mouth as needed. Patient not taking: Reported on 08/13/2023 06/12/23   [provider]  nitroGLYCERIN (NITROSTAT) 0.4 MG SL tablet Place 0.4 mg under the tongue every 5 (five) minutes as needed. Patient not taking: Reported on 08/13/2023 03/01/19   [provider]  polyethylene glycol (MIRALAX / GLYCOLAX) 17 g packet Take 17 g by mouth daily as needed for mild constipation. 08/06/23   [provider]  rosuvastatin (CRESTOR) 20 MG tablet Take 1 tablet (20 mg total) by mouth every evening. 11/18/22   [name], MD  rosuvastatin (CRESTOR) 20 MG tablet Take 1 tablet by mouth daily.    [provider]  senna-docusate (SENOKOT-S) 8.6-50 MG tablet Take 2 tablets by mouth. 08/06/23   [provider]  traMADol (ULTRAM) 50 MG tablet Take 50 mg by mouth every 8 (eight) hours as needed for moderate pain (pain score 4-6). Take a half tablet three times per daily as needed for pain for 7 days 08/12/23 08/19/23  [provider]  Turmeric 500 MG CAPS Take 500 mg by mouth daily.    [provider]  vitamin B-12 (CYANOCOBALAMIN) 1000 MCG tablet Take 1 tablet (1,000 mcg total) by mouth daily. 08/10/20   Joseph Art, DO  Zinc 30 MG TABS Take 30 mg by mouth.    [provider]      Allergies    Penicillins, Bee venom, and Ofev [nintedanib]    Review of Systems   Review of Systems  Physical Exam Updated Vital Signs BP (!) 148/81   Pulse 80   Temp 98 F (36.7 C) (Oral)   Resp 18   Ht 5' 6.5" (1.689 m)   Wt 61.2 kg   SpO2 98%   BMI  21.46 kg/m  Physical Exam Vitals and nursing note reviewed.  Constitutional:      General: She is not in acute distress.    Appearance: She is well-developed. She is not diaphoretic.  HENT:     Head: Normocephalic and atraumatic.     Right Ear: External ear normal.     Left Ear: External ear normal.     Nose: Nose normal.     Mouth/Throat:     Mouth: Mucous membranes are moist.  Eyes:     General: No scleral icterus.    Conjunctiva/sclera: Conjunctivae normal.  Cardiovascular:     Rate and Rhythm: Normal rate and regular rhythm.     Heart sounds: Normal heart sounds. No murmur heard.    No friction rub. No gallop.  Pulmonary:     Effort: Pulmonary effort is normal. No respiratory distress.  Breath sounds: Normal breath sounds.  Abdominal:     General: Bowel sounds are normal. There is no distension.     Palpations: Abdomen is soft. There is no mass.     Tenderness: There is no abdominal tenderness. There is no guarding.  Musculoskeletal:     Cervical back: Normal range of motion.  Skin:    General: Skin is warm and dry.     Comments: Small purpuric lesions over the face, no mucosal involvement in the mouth or eyes.  These are also present over the upper chest bilateral palms and feet.  Neurological:     Mental Status: She is alert and oriented to person, place, and time.  Psychiatric:        Behavior: Behavior normal.     ED Results / Procedures / Treatments   Labs (all labs ordered are listed, but only abnormal results are displayed) Labs Reviewed  CBC WITH DIFFERENTIAL/PLATELET - Abnormal; Notable for the following components:      Result Value   WBC 11.0 (*)    RBC 3.47 (*)    Hemoglobin 10.0 (*)    HCT 31.0 (*)    Neutro Abs 8.7 (*)    Abs Immature Granulocytes 0.32 (*)    All other components within normal limits  COMPREHENSIVE METABOLIC PANEL WITH GFR - Abnormal; Notable for the following components:   Sodium 125 (*)    Potassium 3.2 (*)    Chloride 87  (*)    Glucose, Bld 104 (*)    Total Protein 6.2 (*)    Albumin 3.3 (*)    All other components within normal limits  I-STAT CHEM 8, ED - Abnormal; Notable for the following components:   Sodium 122 (*)    Potassium 3.4 (*)    Chloride 82 (*)    BUN 6 (*)    Hemoglobin 10.9 (*)    HCT 32.0 (*)    All other components within normal limits  RESP PANEL BY RT-PCR (RSV, FLU A&B, COVID)  RVPGX2  URINALYSIS, ROUTINE W REFLEX MICROSCOPIC  LIPASE, BLOOD  CBC WITH DIFFERENTIAL/PLATELET  I-STAT CG4 LACTIC ACID, ED  I-STAT CG4 LACTIC ACID, ED  TROPONIN I (HIGH SENSITIVITY)  TROPONIN I (HIGH SENSITIVITY)    EKG EKG Interpretation Date/Time:  Sunday August 17 2023 10:53:18 EDT Ventricular Rate:  77 PR Interval:  190 QRS Duration:  101 QT Interval:  443 QTC Calculation: 502 R Axis:   50  Text Interpretation: Sinus rhythm Multiple premature complexes, vent & supraven Prolonged QT interval Confirmed by Lorre Nick ([id number]) on 08/17/2023 11:30:16 AM   Radiology DG Chest Port 1 View Result Date: 08/17/2023 CLINICAL DATA:  Nausea, vomiting, and weakness for the past 2 days. History of lung cancer. EXAM: PORTABLE CHEST 1 VIEW COMPARISON:  CT chest dated June 27, 2023. Chest x-ray dated November 03, 2020. FINDINGS: Stable cardiomediastinal silhouette status post mitral valve replacement. Normal pulmonary vascularity. Postsurgical changes in the left lung again noted. Increasing density at the left lung base with possible small left pleural effusion. The right lung is clear. New small left apical pneumothorax. New extensive subcutaneous emphysema in the left chest and lower neck. No acute osseous abnormality. IMPRESSION: 1. New small left apical pneumothorax with extensive subcutaneous emphysema in the left chest and lower neck. 2. Increasing density at the left lung base with possible small left pleural effusion, atelectasis versus pneumonia. Electronically Signed   By: Obie Dredge M.D.   On:  08/17/2023 11:23  Procedures Procedures  {Document cardiac monitor, telemetry assessment procedure when appropriate:1}  Medications Ordered in ED Medications  trimethobenzamide (TIGAN) injection 200 mg (0 mg Intramuscular Hold 08/17/23 1332)  lactated ringers bolus 1,000 mL (1,000 mLs Intravenous New Bag/Given 08/17/23 1228)    ED Course/ Medical Decision Making/ A&P Clinical Course as of 08/17/23 1528  Sun Aug 17, 2023  1147 DG Chest [place] 1 View I visualized and interpreted patient's portable chest x-ray.  She has a small apical pneumothorax with worsening pleural effusion on the left lower lobe.  Review of outpatient records show that on 322 patient had a very similar x-ray that showed subcutaneous emphysema and a small left periapical pneumothorax. [AH]  1238 I-Stat CG4 Lactic Acid [AH]  1238 Urinalysis, Routine w reflex microscopic -Urine, Clean Catch [AH]  1256 Sodium(!): 122 [AH]  1256 Potassium(!): 3.4 [AH]  1257 Sodium(!): 122 Review of outpatient labs shows chronic hyponatremia.  A little bit lower today than normal [AH]  1258 Hemoglobin(!): 10.9 Hemoglobin 10.9.  It was 11.7 on 08/06/2023. [AH]    Clinical Course User Index [AH] Arthor Captain, PA-C   {   Click here for ABCD2, HEART and other calculatorsREFRESH Note before signing :1}                              Medical Decision Making Amount and/or Complexity of Data Reviewed Labs: ordered. Decision-making details documented in ED Course. Radiology: ordered. Decision-making details documented in ED Course.  Risk Prescription drug management. Decision regarding hospitalization.   This patient presents to the ED for concern of weakness, this involves an extensive number of treatment options, and is a complaint that carries with it a high risk of complications and morbidity.   The differential diagnosis of weakness includes but is not limited to neurologic causes (GBS, myasthenia gravis, CVA, MS, ALS, transverse  myelitis, spinal cord injury, CVA, botulism, ) and other causes: ACS, Arrhythmia, syncope, orthostatic hypotension, sepsis, hypoglycemia, electrolyte disturbance, hypothyroidism, respiratory failure, symptomatic anemia, dehydration, heat injury, polypharmacy, malignancy.   Co morbidities:   .recurrent lung cancer, recent lobectomy, ILD  Social Determinants of Health:  Lives alone  Additional history:  {Additional history obtained from patient {External records from outside source obtained and reviewed including outside imaging results, labs   Lab Tests:  I Ordered, and personally interpreted labs.  The pertinent results include:   As per ED course  Imaging Studies:  As per the ED course  Cardiac Monitoring/ECG:  The patient was maintained on a cardiac monitor.  I personally viewed and interpreted the cardiac monitored which showed an underlying rhythm of:   As per the ED course  Medicines ordered and prescription drug management:  I ordered medication including  Medications  trimethobenzamide (TIGAN) injection 200 mg (0 mg Intramuscular Hold 08/17/23 1332)  lactated ringers bolus 1,000 mL (1,000 mLs Intravenous New Bag/Given 08/17/23 1228)   for dehydration, nausea Reevaluation of the patient after these medicines showed that the patient improved I have reviewed the patients home medicines and have made adjustments as needed  Test Considered:    Critical Interventions:    Consultations Obtained: ***  Problem List / ED Course:     ICD-10-CM   1. Generalized weakness  R53.1     2. Failure to thrive (child)  R62.51     3. Nausea  R11.0     4. Prolonged Q-T interval on ECG  R94.31  MDM: ***   Dispostion:  After consideration of the diagnostic results and the patients response to treatment, I feel that the patent would benefit from ***.   {Document critical care time when appropriate:1} {Document review of labs and clinical decision tools ie  heart score, Chads2Vasc2 etc:1}  {Document your independent review of radiology images, and any outside records:1} {Document your discussion with family members, caretakers, and with consultants:1} {Document social determinants of health affecting pt's care:1} {Document your decision making why or why not admission, treatments were needed:1} Final Clinical Impression(s) / ED Diagnoses Final diagnoses:  Generalized weakness  Failure to thrive (child)  Nausea  Prolonged Q-T interval on ECG    Rx / DC Orders ED Discharge Orders     None

## 2023-08-17 NOTE — ED Triage Notes (Signed)
 Patient BIB EMS Two weeks ago lower left lobectomy at [facility] Tuesday  Since then, N/V generalized weakness  No LOC, experiencing dizziness No concern for infection  VS WNL w/ EMS

## 2023-08-17 NOTE — H&P (Signed)
 History and Physical  TARIAH TRANSUE WUJ:[id number] DOB: 09/22/1950 DOA: 08/17/2023  PCP: Melida Quitter, PA   Chief Complaint: Weakness, vomiting  HPI: TOLEEN Williams is a 73 y.o. female with medical history significant for chronic hyponatremia, chronic diastolic congestive heart failure, COPD on room air, hypothyroidism, bovine mitral valve replacement who underwent recent left lower lobe lobectomy at [facility] on 3/14 and is now being admitted to the hospital with hypovolemic hyponatremia and vomiting.  History is provided by the patient, as well as her daughter with whom we spoke over the phone.  She was discharged from [facility] on 3/25, since that time she has been having nausea and pain which has been difficult to control.  She was discharged with tramadol, however she did not like how this made her feel, so she was sent to prescription for oxycodone.  The oxycodone seem to be too strong for her, made her very loopy and so she went back to taking tramadol.  She estimates she has only taken maybe 3 doses in total of tramadol since discharge from the hospital, because she did not like how it made her feel.  As such, she has had uncontrolled pain, and having difficulty sleeping.  She also has been very obstipated which was also an issue while she was at [facility].  Since discharge from the hospital, she has just been taking Senokot once daily and has only had 1 bowel movement in the last 6 days.  She has been trying to eat, but it just makes her very nauseous.  She denies any fevers, chills, blood in her stool or emesis.  No significant cough, or any chest pain.  Review of Systems: Please see HPI for pertinent positives and negatives. A complete 10 system review of systems are otherwise negative.  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 [facility] (had to have botox injection)   COPD (chronic obstructive pulmonary disease) (HCC)    Family history of adverse reaction to anesthesia    Mother- very sensitive to medication   Heart murmur    MVP   Hypothyroidism    Lung cancer (HCC) 12/30/2014   Synchronous primaries:  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, each treated by wedge resection with post-op adjuvant chemoRx at [facility]   MVP (mitral valve prolapse) 2018   s/p Minimally-Invasive Mitral Valve Replacement w/  [facility] Mitral bovine bioprosthetic tissue valve (size 31mm, model # 7300TFX, serial # [id number])   PONV (postoperative nausea and vomiting)    Raynaud's syndrome    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   31 mm [facility] mitral bovine bioprosthetic tissue valve placed via right mini thoracotomy approach   Severe mitral regurgitation 11/15/2014   Shortness of breath dyspnea    with exertion   STD (sexually transmitted disease)    STEMI (ST elevation myocardial infarction) (HCC) 10/24/2020   Telangiectasia    Past Surgical History:  Procedure Laterality Date   BACK SURGERY     x 3  Disectomy   BREAST BIOPSY Left    CARDIAC CATHETERIZATION N/A  03/08/2016   Procedure: Right/Left Heart Cath and Coronary Angiography;  Surgeon: Tonny Bollman, MD;  Location: [facility] INVASIVE CV LAB;  Service: Cardiovascular;  Laterality: N/A;   CLAVICLE SURGERY Left 2013   plate to left collar bone   ENDARTERECTOMY Right 08/09/2020   Procedure: RIGHT CAROTID ENDARTERECTOMY;  Surgeon: Maeola Harman, MD;  Location: [facility] OR;  Service: Vascular;  Laterality: Right;   LAPAROSCOPY     ? reason-age 12    LEFT HEART CATH AND CORONARY ANGIOGRAPHY N/A 02/25/2019   Procedure: LEFT HEART CATH AND CORONARY ANGIOGRAPHY;  Surgeon:  Corky Crafts, MD;  Location: [facility] INVASIVE CV LAB;  Service: Cardiovascular;  Laterality: N/A;   LEFT HEART CATH AND CORONARY ANGIOGRAPHY N/A 10/24/2020   Procedure: LEFT HEART CATH AND CORONARY ANGIOGRAPHY;  Surgeon: Corky Crafts, MD;  Location: [facility] INVASIVE CV LAB;  Service: Cardiovascular;  Laterality: N/A;   LUNG CANCER SURGERY     MITRAL VALVE REPAIR Right 06/27/2016   Procedure: MINIMALLY INVASIVE MITRAL VALVE REPLACEMENT;  Surgeon: Purcell Nails, MD;  Location: [facility] OR;  Service: Open Heart Surgery;  Laterality: Right;   TEE WITHOUT CARDIOVERSION N/A 02/22/2016   Procedure: TRANSESOPHAGEAL ECHOCARDIOGRAM (TEE);  Surgeon: Lars Masson, MD;  Location: [facility] ENDOSCOPY;  Service: Cardiovascular;  Laterality: N/A;   TEE WITHOUT CARDIOVERSION N/A 06/27/2016   Procedure: TRANSESOPHAGEAL ECHOCARDIOGRAM (TEE);  Surgeon: Purcell Nails, MD;  Location: [facility] OR;  Service: Open Heart Surgery;  Laterality: N/A;   TONSILLECTOMY     VIDEO ASSISTED THORACOSCOPY (VATS)/WEDGE RESECTION Left 12/30/2014   Bronchoscopy, Mediastinoscopy, Left VATS for Wedge resection LUL x2 adn LLL x1 - Dr. Ewing Schlein at [facility]   VOCAL CORD INJECTION Left 2017   injected with botox   wrist surgery Left 2015   plate to wrist    Social History:  reports that she quit smoking about 2 months ago. Her smoking use included cigarettes. She started smoking about 40 years ago. She has a 40.1 pack-year smoking history. She has never been exposed to tobacco smoke. She has never used smokeless tobacco. She reports current alcohol use of about 2.0 standard drinks of alcohol per week. She reports that she does not use drugs.  Allergies  Allergen Reactions   Penicillins Other (See Comments) and Hives    Unknown, occurred as a child  Unknown, occurred as a child, Has patient had a PCN reaction causing immediate rash, facial/tongue/throat swelling, SOB or lightheadedness with hypotension: Yes, Has patient had a PCN reaction causing severe  rash involving mucus membranes or skin necrosis: No, Has patient had a PCN reaction that required hospitalization , Has patient had a PCN reaction occurring within the last 10 years: No, If all of the above answers are "NO", then may proceed with Cephalosporin use., No   Bee Venom Swelling    Severe swelling at the sting site.   Ofev [Nintedanib] Diarrhea    Family History  Problem Relation Age of Onset   Mitral valve prolapse Mother    Dementia Father    Prostate cancer Father    Mitral valve prolapse Sister    Mitral valve prolapse Brother    Healthy Son    Healthy Daughter    Colon cancer Neg Hx    Colon polyps Neg Hx    Rectal cancer Neg Hx    Esophageal cancer Neg Hx    Stomach cancer Neg Hx    Heart attack Neg Hx      Prior to Admission medications  Medication Sig Start Date End Date Taking? Authorizing Provider  acetaminophen (TYLENOL) 500 MG tablet Take 1,000 mg by mouth every 6 (six) hours as needed for mild pain (pain score 1-3) or headache.   Yes [provider]  Cholecalciferol (VITAMIN D) 125 MCG (5000 UT) CAPS Take 5,000 Units by mouth daily.   Yes [provider]  clopidogrel (PLAVIX) 75 MG tablet Take 1 tablet (75 mg total) by mouth daily. 04/08/23  Yes Parke Poisson, MD  ferrous sulfate 325 (65 FE) MG tablet Take 1 tablet (325 mg total) by mouth daily. Patient taking differently: Take 325 mg by mouth daily. OTC 02/11/23  Yes Parke Poisson, MD  fluticasone (FLONASE) 50 MCG/ACT nasal spray Place 1 spray into both nostrils daily.   Yes [provider]  Fluticasone-Umeclidin-Vilant (TRELEGY ELLIPTA) 100-62.5-25 MCG/ACT AEPB INHALE 1 PUFF BY MOUTH EVERY DAY 04/08/23  Yes Kalman Shan, MD  gabapentin (NEURONTIN) 100 MG capsule Take 100 mg by mouth 2 (two) times daily. 08/06/23 08/20/23 Yes [provider]  hydrALAZINE (APRESOLINE) 25 MG tablet Take 1 tablet (25 mg total) by mouth daily as needed (for systolic BP>150). 06/19/22   Yes [name], MD  ibuprofen (ADVIL) 200 MG tablet Take 400 mg by mouth every 6 (six) hours as needed for moderate pain (pain score 4-6).   Yes [provider]  levothyroxine (SYNTHROID) 150 MCG tablet Take 1 tablet (150 mcg total) by mouth daily. 03/04/23  Yes Saralyn Pilar A, PA  MELATONIN PO Take 1 tablet by mouth at bedtime as needed (sleep).   Yes [provider]  methocarbamol (ROBAXIN) 500 MG tablet Take 500 mg by mouth 4 (four) times daily.   Yes [provider]  Multiple Vitamins-Minerals (MULTIVITAMIN WITH MINERALS) tablet Take 1 tablet by mouth daily.   Yes [provider]  nitroGLYCERIN (NITROSTAT) 0.4 MG SL tablet Place 0.4 mg under the tongue every 5 (five) minutes as needed. 03/01/19  Yes [provider]  ondansetron (ZOFRAN-ODT) 4 MG disintegrating tablet Take 4 mg by mouth every 8 (eight) hours as needed for nausea or vomiting. 08/14/23 08/21/23 Yes [provider]  oxyCODONE (OXY IR/ROXICODONE) 5 MG immediate release tablet Take 5 mg by mouth every 6 (six) hours as needed for severe pain (pain score 7-10). 08/14/23 08/19/23 Yes [provider]  rosuvastatin (CRESTOR) 20 MG tablet Take 1 tablet (20 mg total) by mouth every evening. 11/18/22  Yes [name], MD  sennosides-docusate sodium (SENOKOT-S) 8.6-50 MG tablet Take 1 tablet by mouth daily.   Yes [provider]  traMADol (ULTRAM) 50 MG tablet Take 50 mg by mouth every 8 (eight) hours as needed for moderate pain (pain score 4-6). Take a half tablet three times per daily as needed for pain for 7 days 08/12/23 08/19/23 Yes [provider]  Turmeric 500 MG CAPS Take 500 mg by mouth daily.   Yes [provider]  vitamin B-12 (CYANOCOBALAMIN) 1000 MCG tablet Take 1 tablet (1,000 mcg total) by mouth daily. 08/10/20  Yes Marlin Canary U, DO  Zinc 30 MG TABS Take 30 mg by mouth.   Yes [provider]  buPROPion (WELLBUTRIN SR)  150 MG 12 hr tablet TAKE 1 TABLET BY MOUTH TWICE A DAY Patient not taking: Reported on 06/26/2023 02/10/23   Icard, Rachel Bo, DO  buPROPion (WELLBUTRIN SR) 150 MG 12 hr tablet Take 150mg  once a day for 3 days, increase to 150mg  twice a day Patient not taking:  Reported on 06/26/2023 03/04/23   Kalman Shan, MD  EPINEPHrine (EPIPEN 2-PAK) 0.3 mg/0.3 mL IJ SOAJ injection Inject 0.3 mg into the muscle as needed for anaphylaxis. Patient not taking: Reported on 08/13/2023 12/13/20   Carlean Jews, NP  EPINEPHrine 0.3 mg/0.3 mL IJ SOAJ injection Inject 0.3 mg into the muscle as needed. Patient not taking: Reported on 08/13/2023 12/13/20   [provider]  Fluticasone-Umeclidin-Vilant (TRELEGY ELLIPTA) 100-62.5-25 MCG/ACT AEPB Inhale 1 puff into the lungs daily. Patient not taking: Reported on 08/17/2023    [provider]    Physical Exam: BP (!) 148/81   Pulse 80   Temp 98 F (36.7 C) (Oral)   Resp 18   Ht 5' 6.5" (1.689 m)   Wt 61.2 kg   SpO2 98%   BMI 21.46 kg/m  General:  Alert, oriented, calm, in no acute distress, she looks quite tired, and is obviously volume depleted.  Her oropharynx is very dry, her lips are cracked. Cardiovascular: RRR, no murmurs or rubs, no peripheral edema  Respiratory: clear to auscultation bilaterally, no wheezes, no crackles  Abdomen: soft, nontender, very slightly distended, normal bowel tones heard  Skin: dry, no rashes  Musculoskeletal: no joint effusions, normal range of motion  Psychiatric: appropriate affect, normal speech  Neurologic: extraocular muscles intact, clear speech, moving all extremities with intact sensorium         Labs on Admission:  Basic Metabolic Panel: Recent Labs  Lab 08/17/23 1236 08/17/23 1430  NA 122* 125*  K 3.4* 3.2*  CL 82* 87*  CO2  --  29  GLUCOSE 99 104*  BUN 6* 8  CREATININE 0.60 0.50  CALCIUM  --  9.0   Liver Function Tests: Recent Labs  Lab 08/17/23 1430  AST 31  ALT 33  ALKPHOS 58   BILITOT 0.7  PROT 6.2*  ALBUMIN 3.3*   Recent Labs  Lab 08/17/23 1430  LIPASE 28   No results for input(s): "AMMONIA" in the last 168 hours. CBC: Recent Labs  Lab 08/17/23 1236 08/17/23 1300  WBC  --  11.0*  NEUTROABS  --  8.7*  HGB 10.9* 10.0*  HCT 32.0* 31.0*  MCV  --  89.3  PLT  --  287   Cardiac Enzymes: No results for input(s): "CKTOTAL", "CKMB", "CKMBINDEX", "TROPONINI" in the last 168 hours. BNP (last 3 results) No results for input(s): "BNP" in the last 8760 hours.  ProBNP (last 3 results) No results for input(s): "PROBNP" in the last 8760 hours.  CBG: No results for input(s): "GLUCAP" in the last 168 hours.  Radiological Exams on Admission: DG Chest Port 1 View Result Date: 08/17/2023 CLINICAL DATA:  Nausea, vomiting, and weakness for the past 2 days. History of lung cancer. EXAM: PORTABLE CHEST 1 VIEW COMPARISON:  CT chest dated June 27, 2023. Chest x-ray dated November 03, 2020. FINDINGS: Stable cardiomediastinal silhouette status post mitral valve replacement. Normal pulmonary vascularity. Postsurgical changes in the left lung again noted. Increasing density at the left lung base with possible small left pleural effusion. The right lung is clear. New small left apical pneumothorax. New extensive subcutaneous emphysema in the left chest and lower neck. No acute osseous abnormality. IMPRESSION: 1. New small left apical pneumothorax with extensive subcutaneous emphysema in the left chest and lower neck. 2. Increasing density at the left lung base with possible small left pleural effusion, atelectasis versus pneumonia. Electronically Signed   By: Obie Dredge M.D.   On: 08/17/2023 11:23  Assessment/Plan Joy Williams is a 73 y.o. female with medical history significant for acute on chronic hyponatremia, chronic diastolic congestive heart failure, COPD on room air, hypothyroidism, bovine mitral valve replacement who underwent recent left lower lobe lobectomy at  [facility] on 3/14 and is now being admitted to the hospital with hypovolemic hyponatremia and vomiting in the setting of severe constipation.  Hypovolemic hyponatremia-I suspect volume depletion, dehydration and hyponatremia are contributing to her weakness.  May be a result of significant constipation which is leading to nausea/vomiting and anorexia.  It seems that while she was at [facility], sodium level ranged from 125-130. -Inpatient admission -IV normal saline infusion -Monitor sodium levels every 8 hours  Nausea-I suspect this is due to significant constipation, which has been a problem for her since her surgery.  Abdomen is slightly distended, but soft and nontender. -Check KUB to rule out obstruction or other complication -Scheduled Senokot, MiraLAX -May benefit from enema if no result  Status post recent lobectomy at [facility], with known small stable left apical pneumothorax with subcutaneous emphysema.  Pain control has been difficult for her, will attempt to treat her pain with Tylenol and very low-dose p.o. oxycodone as needed.  Generalized weakness-I suspect this is mainly due to significant dehydration, poor p.o. intake, etc. -PT/OT consult  Chronic diastolic congestive heart failure-no evidence of acute exacerbation  Carotid arterial disease-continue Plavix  COPD-stable on room air without any evidence of acute exacerbation, will continue Trelegy equivalent while in the hospital  Hyperlipidemia-Crestor  DVT prophylaxis: Lovenox     Code Status: Full Code  Consults called: None  Admission status: The appropriate patient status for this patient is INPATIENT. Inpatient status is judged to be reasonable and necessary in order to provide the required intensity of service to ensure the patient's safety. The patient's presenting symptoms, physical exam findings, and initial radiographic and laboratory data in the context of their chronic comorbidities is felt to place  them at high risk for further clinical deterioration. Furthermore, it is not anticipated that the patient will be medically stable for discharge from the hospital within 2 midnights of admission.    I certify that at the point of admission it is my clinical judgment that the patient will require inpatient hospital care spanning beyond 2 midnights from the point of admission due to high intensity of service, high risk for further deterioration and high frequency of surveillance required  Time spent: 59 minutes  Jozef Eisenbeis Sharlette Dense MD [facility] Pager [phone]  If 7PM-7AM, please contact night-coverage [email or web address] Password [facility]  08/17/2023, 4:33 PM

## 2023-08-17 NOTE — ED Notes (Signed)
 IV no longer flushing or pulling back. Patient c/o pain at IV site. Fluids paused, IV saline locked. IV team order placed for insertion of new IV and recollection of labs

## 2023-08-17 NOTE — ED Provider Notes (Signed)
 I provided a substantive portion of the care of this patient.  I personally made/approved the management plan for this patient and take responsibility for the patient management.  EKG Interpretation Date/Time:  Sunday August 17 2023 10:53:18 EDT Ventricular Rate:  77 PR Interval:  190 QRS Duration:  101 QT Interval:  443 QTC Calculation: 502 R Axis:   50  Text Interpretation: Sinus rhythm Multiple premature complexes, vent & supraven Prolonged QT interval Confirmed by Lorre Nick ([id number]) on 08/17/2023 11:30:16 AM    Patient is EKG shows normal sinus rhythm.  Chest x-ray does show evidence of small apical pneumothorax with some subcu emphysema.  This is similar to what she had just recently after she had a catheter removed from her left hemothorax.  Patient complains of having diffuse weakness.  She has evidence of hyponatremia here.  Will give IV fluids and admit   Lorre Nick, MD 08/17/23 1323

## 2023-08-18 DIAGNOSIS — E871 Hypo-osmolality and hyponatremia: Secondary | ICD-10-CM | POA: Diagnosis not present

## 2023-08-18 LAB — TSH: TSH: 11.104 u[IU]/mL — ABNORMAL HIGH (ref 0.350–4.500)

## 2023-08-18 LAB — BASIC METABOLIC PANEL WITH GFR
Anion gap: 11 (ref 5–15)
Anion gap: 11 (ref 5–15)
Anion gap: 8 (ref 5–15)
BUN: 7 mg/dL — ABNORMAL LOW (ref 8–23)
BUN: 6 mg/dL — ABNORMAL LOW (ref 8–23)
BUN: 6 mg/dL — ABNORMAL LOW (ref 8–23)
CO2: 23 mmol/L (ref 22–32)
CO2: 26 mmol/L (ref 22–32)
CO2: 24 mmol/L (ref 22–32)
Calcium: 9 mg/dL (ref 8.9–10.3)
Calcium: 8.8 mg/dL — ABNORMAL LOW (ref 8.9–10.3)
Calcium: 8.1 mg/dL — ABNORMAL LOW (ref 8.9–10.3)
Chloride: 90 mmol/L — ABNORMAL LOW (ref 98–111)
Chloride: 90 mmol/L — ABNORMAL LOW (ref 98–111)
Chloride: 92 mmol/L — ABNORMAL LOW (ref 98–111)
Creatinine, Ser: 0.51 mg/dL (ref 0.44–1.00)
Creatinine, Ser: 0.45 mg/dL (ref 0.44–1.00)
Creatinine, Ser: 0.52 mg/dL (ref 0.44–1.00)
GFR, Estimated: >60 mL/min (ref >60)
GFR, Estimated: >60 mL/min (ref >60)
GFR, Estimated: >60 mL/min (ref >60)
Glucose, Bld: 99 mg/dL (ref 70–99)
Glucose, Bld: 99 mg/dL (ref 70–99)
Glucose, Bld: 102 mg/dL — ABNORMAL HIGH (ref 70–99)
Potassium: 4 mmol/L (ref 3.5–5.1)
Potassium: 3.5 mmol/L (ref 3.5–5.1)
Potassium: 3.5 mmol/L (ref 3.5–5.1)
Sodium: 124 mmol/L — ABNORMAL LOW (ref 135–145)
Sodium: 127 mmol/L — ABNORMAL LOW (ref 135–145)
Sodium: 124 mmol/L — ABNORMAL LOW (ref 135–145)

## 2023-08-18 LAB — MAGNESIUM: Magnesium: 1.8 mg/dL (ref 1.7–2.4)

## 2023-08-18 MED ORDER — LIDOCAINE 5 % EX PTCH
1.0000 | MEDICATED_PATCH | CUTANEOUS | Status: DC
  Administered 2023-08-18 – 2023-08-19 (×2): 1 via TRANSDERMAL
  Filled 2023-08-18 (×2): qty 1

## 2023-08-18 MED ORDER — SODIUM CHLORIDE 1 G PO TABS
1.0000 g | ORAL_TABLET | Freq: Three times a day (TID) | ORAL | Status: DC
  Administered 2023-08-18 – 2023-08-19 (×5): 1 g via ORAL
  Filled 2023-08-18 (×5): qty 1

## 2023-08-18 MED ORDER — MORPHINE SULFATE 15 MG PO TABS
7.5000 mg | ORAL_TABLET | ORAL | Status: DC | PRN
  Administered 2023-08-18 – 2023-08-19 (×4): 7.5 mg via ORAL
  Filled 2023-08-18 (×4): qty 1

## 2023-08-18 MED ORDER — SODIUM CHLORIDE 0.9 % IV SOLN: INTRAVENOUS | Status: DC

## 2023-08-18 MED ORDER — LACTULOSE 10 GM/15ML PO SOLN
30.0000 g | Freq: Two times a day (BID) | ORAL | Status: DC
Start: 2023-08-18 — End: 2023-08-19
  Administered 2023-08-18 – 2023-08-19 (×3): 30 g via ORAL
  Filled 2023-08-18 (×3): qty 45

## 2023-08-18 NOTE — TOC Initial Note (Signed)
 Transition of Care [facility]) - Initial/Assessment Note    Patient Details  Name: Joy Williams MRN: [id number] Date of Birth: Aug 26, 1950  Transition of Care [facility]) CM/SW Contact:    Otelia Santee, LCSW Phone Number: 08/18/2023, 1:50 PM  Clinical Narrative:                 Pt from home alone. Pt currently active with [facility] for HHPT. HH orders will need to be placed prior to discharge. Pt has rollator at home and denies further DME needs. TOC will continue to follow.   Expected Discharge Plan: Home w Home Health Services Barriers to Discharge: No Barriers Identified   Patient Goals and CMS Choice Patient states their goals for this hospitalization and ongoing recovery are:: To return home CMS Medicare.gov Compare Post Acute Care list provided to:: Patient Choice offered to / list presented to : Patient [place] ownership interest in [facility].provided to::  (NA)    Expected Discharge Plan and Services In-house Referral: Clinical Social Work Discharge Planning Services: NA Post Acute Care Choice: Home Health Living arrangements for the past 2 months: Single Family Home                 DME Arranged: N/A DME Agency: NA                  Prior Living Arrangements/Services Living arrangements for the past 2 months: Single Family Home Lives with:: Self Patient language and need for interpreter reviewed:: Yes Do you feel safe going back to the place where you live?: Yes      Need for Family Participation in Patient Care: No (Comment) Care giver support system in place?: Yes (comment) Current home services: DME, Home OT Criminal Activity/Legal Involvement Pertinent to Current Situation/Hospitalization: No - Comment as needed  Activities of Daily Living   ADL Screening (condition at time of admission) Independently performs ADLs?: Yes (appropriate for developmental age) Is the patient deaf or have difficulty hearing?: No Does the patient have difficulty  seeing, even when wearing glasses/contacts?: No Does the patient have difficulty concentrating, remembering, or making decisions?: No  Permission Sought/Granted Permission sought to share information with : Facility Industrial/product designer granted to share information with : Yes, Verbal Permission Granted     Permission granted to share info w AGENCY: [facility]        Emotional Assessment Appearance:: Appears stated age Attitude/Demeanor/Rapport: Engaged Affect (typically observed): Accepting Orientation: : Oriented to Self, Oriented to Place, Oriented to Situation, Oriented to  Time Alcohol / Substance Use: Not Applicable Psych Involvement: No (comment)  Admission diagnosis:  Hyponatremia [E87.1] Nausea [R11.0] Prolonged Q-T interval on ECG [R94.31] Failure to thrive (child) [R62.51] Generalized weakness [R53.1] Patient Active Problem List   Diagnosis Date Noted   Hyponatremia 08/17/2023   Bee sting allergy 12/13/2020   Chronic diastolic congestive heart failure (HCC) 10/25/2020   STEMI (ST elevation myocardial infarction) (HCC) 10/24/2020   Takotsubo cardiomyopathy    Symptomatic stenosis of right carotid artery 08/08/2020   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) 07/14/2019   History of TIA (transient ischemic attack) 05/10/2019   NSTEMI (non-ST elevated myocardial infarction) (HCC) 02/25/2019   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   Memory loss or impairment 04/27/2018   Hyperlipidemia LDL goal <70 03/12/2018   Age-related osteoporosis without current pathological fracture 02/20/2017   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   Tobacco  abuse 01/22/2017   Vitamin D insufficiency 01/22/2017   Estrogen deficiency 01/14/2017   Hypothyroidism 01/06/2017   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 [place] [address] Phone: [phone] Fax: [phone]     Social Drivers of Health (SDOH) Social History: SDOH Screenings   Food Insecurity: No Food Insecurity (08/17/2023)  Housing: Low Risk  (08/17/2023)  Transportation Needs: No Transportation Needs (08/17/2023)  Utilities: Not At Risk (08/17/2023)  Alcohol Screen: Low Risk  (09/04/2022)  Depression (PHQ2-9): Low Risk  (07/24/2023)  Financial Resource Strain: Low Risk  (08/01/2023)   [facility]  Physical Activity: Insufficiently Active (09/04/2022)  Social Connections: Moderately Integrated (09/04/2022)  Stress: No Stress Concern Present (09/04/2022)  Tobacco Use: Medium Risk (07/31/2023)   Received from [facility]   SDOH Interventions: Housing Interventions: Intervention Not Indicated Transportation Interventions: Intervention Not Indicated Utilities Interventions: Intervention Not Indicated   Readmission Risk Interventions    08/18/2023    1:46 PM  Readmission Risk Prevention Plan  Post Dischage Appt Complete  Medication Screening Complete  Transportation Screening Complete

## 2023-08-18 NOTE — Evaluation (Signed)
 Occupational Therapy Evaluation Patient Details Name: Joy Williams MRN: [id number] DOB: 1950-12-07 Today's Date: 08/18/2023   History of Present Illness   Joy Williams is a 73 y.o. female s/p L lower lobe lobectomy at [facility] on 3/14; admitted 3/20 for hypovolemic hyponatremia and vomiting. PMH: chronic hyponatremia, chronic diastolic congestive heart failure, COPD, hypothyroidism, lung cancer, STEMI, bovine mitral valve replacement     Clinical Impressions PTA, patient had just returned home from [facility] with home therapy just about to get started when patient was re-hospitalized. Before 08/01/23, patient lives alone and was independent with all aspects of A/IADL's and mobility including driving and caring for her small dog. Family lives nearby. Currently, patient presents with functional status outlined below with pain, generalized weakness, balance and activity tolerance deficits impacting BADL's, transfers and mobility. Patient will require skilled OT while in Acute care hospital setting and [facility] services with family support upon discharge home.      If plan is discharge home, recommend the following:   A little help with walking and/or transfers;A little help with bathing/dressing/bathroom;Assistance with cooking/housework;Direct supervision/assist for medications management;Direct supervision/assist for financial management;Assist for transportation;Help with stairs or ramp for entrance     Functional Status Assessment   Patient has had a recent decline in their functional status and demonstrates the ability to make significant improvements in function in a reasonable and predictable amount of time.     Equipment Recommendations   None recommended by OT      Precautions/Restrictions   Precautions Precautions: Fall Restrictions Weight Bearing Restrictions Per Provider Order: No     Mobility Bed Mobility Overal bed mobility: Needs Assistance Bed Mobility:  Supine to Sit, Sit to Supine     Supine to sit: HOB elevated, Used rails, Supervision Sit to supine: Supervision, HOB elevated, Used rails   General bed mobility comments: Returned to supine following ADL's and toileting with supervision and bed features    Transfers Overall transfer level: Needs assistance Equipment used: 1 person hand held assist Transfers: Sit to/from Stand, Bed to chair/wheelchair/BSC             General transfer comment: ambulated from bed to and from bathroom with HHA      Balance Overall balance assessment: Mild deficits observed, not formally tested                                         ADL either performed or assessed with clinical judgement   ADL Overall ADL's : Needs assistance/impaired Eating/Feeding: Bed level;Set up   Grooming: Wash/dry hands;Wash/dry face;Oral care;Applying deodorant;Set up;Sitting (EOB)   Upper Body Bathing: Minimal assistance;Sitting Upper Body Bathing Details (indicate cue type and reason): fatigues easily with need for constant rest breaks throughout Lower Body Bathing: Minimal assistance;Sitting/lateral leans;Sit to/from stand   Upper Body Dressing : Minimal assistance;Sitting   Lower Body Dressing: Minimal assistance;Sitting/lateral leans;Sit to/from stand   Toilet Transfer: Minimal assistance;Ambulation;Grab bars   Toileting- Clothing Manipulation and Hygiene: Contact guard assist;Sitting/lateral lean;Sit to/from stand       Functional mobility during ADLs: Minimal assistance General ADL Comments: requires constant rest breaks, pain and poor activity tolerance limited tasks     Vision Baseline Vision/History: 1 Wears glasses;0 No visual deficits Ability to See in Adequate Light: 0 Adequate Patient Visual Report: No change from baseline Vision Assessment?: No apparent visual deficits;Wears glasses for reading  Perception Perception: Within Functional Limits       Praxis Praxis:  WFL       Pertinent Vitals/Pain Pain Assessment Pain Assessment: 0-10 Pain Score: 3  Pain Location: abdomen, L flank Pain Descriptors / Indicators: Cramping, Aching, Guarding, Grimacing, Tender Pain Intervention(s): Limited activity within patient's tolerance, Monitored during session, Repositioned, Heat applied, Premedicated before session     Extremity/Trunk Assessment Upper Extremity Assessment Upper Extremity Assessment: Generalized weakness;Right hand dominant   Lower Extremity Assessment Lower Extremity Assessment: Generalized weakness   Cervical / Trunk Assessment Cervical / Trunk Assessment: Normal   Communication Communication Communication: No apparent difficulties Factors Affecting Communication: Hearing impaired (B hearing aides)   Cognition Arousal: Alert Behavior During Therapy: WFL for tasks assessed/performed                                 Following commands: Intact       Cueing  General Comments   Cueing Techniques: Verbal cues  pain and weakness limits tolerance to BADL's and mobility           Home Living Family/patient expects to be discharged to:: Private residence Living Arrangements: Alone Available Help at Discharge: Family;Friend(s);Available PRN/intermittently Type of Home: House Home Access: Stairs to enter [employer] of Steps: 3 Entrance Stairs-Rails: None Home Layout: One level     Bathroom Shower/Tub: Walk-in shower;Door   [employer] Toilet: Standard Bathroom Accessibility: Yes How Accessible: Accessible via wheelchair;Accessible via walker Home Equipment: Rollator (4 wheels);Hand held shower head;Shower seat   Additional Comments: Home PT and OT were just about to start after d/c [facility]      Prior Functioning/Environment Prior Level of Function : Independent/Modified Independent;Needs assist             Mobility Comments: ind without AD; has been using rollator around the home for limited  distances since recent hospitalization ADLs Comments: ind at baseline;  "slow" and family completing household chores since recent hospitalization    OT Problem List: Decreased strength;Decreased activity tolerance;Impaired balance (sitting and/or standing);Cardiopulmonary status limiting activity;Pain   OT Treatment/Interventions: Self-care/ADL training;Therapeutic exercise;Energy conservation;DME and/or AE instruction;Therapeutic activities;Patient/family education;Balance training      OT Goals(Current goals can be found in the care plan section)   Acute Rehab OT Goals Patient Stated Goal: to get stronger OT Goal Formulation: With patient Time For Goal Achievement: 09/01/23 Potential to Achieve Goals: Good ADL Goals Pt Will Perform Lower Body Bathing: with set-up Pt Will Perform Lower Body Dressing: with set-up Pt Will Transfer to Toilet: with supervision;ambulating Additional ADL Goal #1: Patient will be independent to identify 3/3 ECT strategies with ADL's and mobility   OT Frequency:  Min 2X/week       AM-PAC OT "6 Clicks" Daily Activity     Outcome Measure Help from another person eating meals?: A Lot Help from another person taking care of personal grooming?: A Little Help from another person toileting, which includes using toliet, bedpan, or urinal?: A Little Help from another person bathing (including washing, rinsing, drying)?: A Little Help from another person to put on and taking off regular upper body clothing?: A Little Help from another person to put on and taking off regular lower body clothing?: A Little 6 Click Score: 17   End of Session Equipment Utilized During Treatment: Gait belt Nurse Communication: Mobility status  Activity Tolerance: Patient limited by fatigue;Patient limited by pain Patient left: in bed;with call bell/phone within  reach;with bed alarm set  OT Visit Diagnosis: Unsteadiness on feet (R26.81);Muscle weakness (generalized)  (M62.81);Pain Pain - Right/Left: Left Pain - part of body:  (flank and back)                Time: 1326-1405 OT Time Calculation (min): 39 min Charges:  OT General Charges $OT Visit: 1 Visit OT Evaluation $OT Eval Moderate Complexity: 1 Mod OT Treatments $Therapeutic Activity: 23-37 mins Kevona Lupinacci OT/L Acute Rehabilitation Department  [phone]  08/18/2023, 4:08 PM

## 2023-08-18 NOTE — Evaluation (Signed)
 Physical Therapy Evaluation Patient Details Name: Joy Williams MRN: [id number] DOB: Jul 30, 1950 Today's Date: 08/18/2023  History of Present Illness  Joy Williams is a 73 y.o. female s/p L lower lobe lobectomy at [facility] on 3/14; admitted 3/20 for hypovolemic hyponatremia and vomiting. PMH: chronic hyponatremia, chronic diastolic congestive heart failure, COPD, hypothyroidism, lung cancer, STEMI, bovine mitral valve replacement  Clinical Impression  Pt admitted with above diagnosis. At baseline, pt reports ind without AD, lives along with good family support. Pt reports since surgery 3/14, has been slower, using rollator walker, family has been completing household chores. On eval, pt limited significantly by pain, comes to sitting EOB with supv, using bedrails and elevated HOB, no physical assistance. Pt ind repositioning in bed to comfort. Pt unable to tolerate standing or ambulating on eval due to pain, reports ambulated to restroom earlier with nursing. Anticipate HHPT at d/c with family support. Pt currently with functional limitations due to the deficits listed below (see PT Problem List). Pt will benefit from acute skilled PT to increase their independence and safety with mobility to allow discharge.           If plan is discharge home, recommend the following: A little help with walking and/or transfers;A little help with bathing/dressing/bathroom;Assistance with cooking/housework;Assist for transportation;Help with stairs or ramp for entrance   Can travel by private vehicle        Equipment Recommendations None recommended by PT  Recommendations for Other Services       Functional Status Assessment Patient has had a recent decline in their functional status and demonstrates the ability to make significant improvements in function in a reasonable and predictable amount of time.     Precautions / Restrictions Precautions Precautions: Fall Restrictions Weight Bearing  Restrictions Per Provider Order: No      Mobility  Bed Mobility Overal bed mobility: Needs Assistance Bed Mobility: Supine to Sit, Sit to Supine     Supine to sit: HOB elevated, Used rails, Supervision Sit to supine: Supervision, HOB elevated, Used rails   General bed mobility comments: supv for supine<>sit with HOB elevated and use of bedrail, increased time and effort, limited by pain sitting EOB and has to return to supine    Transfers                        Ambulation/Gait                  Stairs            Wheelchair Mobility     Tilt Bed    Modified Rankin (Stroke Patients Only)       Balance                                             Pertinent Vitals/Pain Pain Assessment Pain Assessment: 0-10 Pain Score: 5  Pain Location: abdomen, L flank Pain Descriptors / Indicators: Grimacing, Guarding, Crying Pain Intervention(s): Limited activity within patient's tolerance, Monitored during session, Premedicated before session, Repositioned, Patient requesting pain meds-RN notified    Home Living Family/patient expects to be discharged to:: Private residence Living Arrangements: Alone Available Help at Discharge: Family;Friend(s);Available PRN/intermittently Type of Home: House Home Access: Stairs to enter Entrance Stairs-Rails: None Entrance Stairs-Number of Steps: 3   Home Layout: One level Home Equipment: Rollator (4 wheels) Additional  Comments: pt reports had HHPT ordered after d/c from [facility]    Prior Function Prior Level of Function : Independent/Modified Independent;Needs assist             Mobility Comments: ind without AD; has been using rollator around the home for limited distances since recent hospitalization ADLs Comments: ind at baseline;  "slow" and family completing household chores since recent hospitalization     Extremity/Trunk Assessment   Upper Extremity Assessment Upper Extremity  Assessment: Defer to OT evaluation    Lower Extremity Assessment Lower Extremity Assessment: Generalized weakness    Cervical / Trunk Assessment Cervical / Trunk Assessment: Normal  Communication   Communication Communication: No apparent difficulties    Cognition Arousal: Alert Behavior During Therapy: WFL for tasks assessed/performed   PT - Cognitive impairments: No apparent impairments                       PT - Cognition Comments: pt tearful regarding pain and current functional status Following commands: Intact       Cueing Cueing Techniques: Verbal cues     General Comments      Exercises     Assessment/Plan    PT Assessment Patient needs continued PT services  PT Problem List Decreased strength;Decreased activity tolerance;Decreased balance;Decreased mobility;Pain       PT Treatment Interventions DME instruction;Gait training;Stair training;Functional mobility training;Therapeutic activities;Therapeutic exercise;Balance training;Patient/family education    PT Goals (Current goals can be found in the Care Plan section)  Acute Rehab PT Goals Patient Stated Goal: less pain PT Goal Formulation: With patient Time For Goal Achievement: 09/01/23 Potential to Achieve Goals: Good    Frequency Min 3X/week     Co-evaluation               AM-PAC PT "6 Clicks" Mobility  Outcome Measure Help needed turning from your back to your side while in a flat bed without using bedrails?: A Little Help needed moving from lying on your back to sitting on the side of a flat bed without using bedrails?: A Little Help needed moving to and from a bed to a chair (including a wheelchair)?: A Little Help needed standing up from a chair using your arms (e.g., wheelchair or bedside chair)?: A Little Help needed to walk in hospital room?: A Little Help needed climbing 3-5 steps with a railing? : A Lot 6 Click Score: 17    End of Session   Activity Tolerance:  Patient limited by pain Patient left: in bed;with call bell/phone within reach;with bed alarm set Nurse Communication: Mobility status;Patient requests pain meds PT Visit Diagnosis: Other abnormalities of gait and mobility (R26.89);Pain;Muscle weakness (generalized) (M62.81) Pain - Right/Left: Left Pain - part of body:  (abdomen/flank)    Time: 1610-9604 PT Time Calculation (min) (ACUTE ONLY): 15 min   Charges:   PT Evaluation $PT Eval Low Complexity: 1 Low   PT General Charges $$ ACUTE PT VISIT: 1 Visit          Tori Keylen Eckenrode PT, DPT 08/18/23, 11:04 AM

## 2023-08-18 NOTE — Progress Notes (Signed)
 PROGRESS NOTE  Joy Williams:[id number] DOB: Aug 05, 1950 DOA: 08/17/2023 PCP: Melida Quitter, PA   LOS: 1 day   Brief Narrative / Interim history: 73 year old female with chronic hyponatremia, chronic diastolic CHF, COPD on room air, hypothyroidism, bovine mitral valve replacement, who recently underwent left lower lobe lobectomy at [facility] for early cancer, surgery was on 3/14, comes into the hospital with persistent poor p.o. intake, weakness, uncontrolled pain, nausea, vomiting.  She was apparently hospitalized for 12 days, eventually discharged home but was there for only 4 days before seeking care again.  She reports ongoing weakness as well as left-sided chest pain at the site of the surgery as well as where she had the chest tube.  She took some tramadol as well as oxycodone following her hospitalization, however the medications made her feel very loopy.  She has been using over-the-counter lidocaine patch with some relief.  She also reports severe constipation with only 1 bowel movement over the last week.  Subjective / 24h Interval events: Continues to feel weak this morning, no significant nausea.  Complains of left-sided chest pain, has been using 1 mg of morphine with some relief  Assesement and Plan: Principal problem Hyponatremia -this is likely multifactorial with a chronic component to it, recent lung issues, poor with low solute intake.  In addition, she tells me she is drinking "a lot of water", suggesting she may have an excess of free water. -Advised patient to limit the amount of free water that she drinks and focus on soups, Gatorade, will also place on salt tabs and continue isotonic IV fluids.   Active problems Nausea, vomiting -likely in the setting of significant constipation, this has been a problem for her since surgery.  KUB without ileus or obstruction.  Placed on aggressive bowel regimen with Senokot, MiraLAX as well as lactulose  Lung cancer status  post recent lobectomy at [facility] -she has a known left apical pneumothorax, small, also subcutaneous emphysema.  Continue pain control, add lidocaine patch, continue IV morphine at a very low dose.  She tells me she is cancer free now.  Outpatient management  Generalized weakness-in the setting of recent prolonged hospitalization.  PT consult  Diastolic CHF-looks dry now  Carotid artery disease-continue Plavix  COPD-on room air, stable, no wheezing  Hyperlipidemia-continue statin  Hypothyroidism-continue Synthroid.  Check a TSH due to hyponatremia   Scheduled Meds:  clopidogrel  75 mg Oral Daily   cyanocobalamin  1,000 mcg Oral Daily   enoxaparin (LOVENOX) injection  40 mg Subcutaneous Q24H   ferrous sulfate  325 mg Oral Daily   fluticasone  1 spray Each Nare Daily   fluticasone furoate-vilanterol  1 puff Inhalation Daily   And   umeclidinium bromide  1 puff Inhalation Daily   gabapentin  100 mg Oral BID   lactulose  30 g Oral BID   levothyroxine  150 mcg Oral Daily   lidocaine  1 patch Transdermal Q24H   methocarbamol  500 mg Oral QID   polyethylene glycol  17 g Oral BID   rosuvastatin  20 mg Oral QPM   senna-docusate  2 tablet Oral BID   sodium chloride  1 g Oral TID WC   Continuous Infusions: PRN Meds:.acetaminophen **OR** acetaminophen, morphine injection  Current Outpatient Medications  Medication Instructions   acetaminophen (TYLENOL) 1,000 mg, Every 6 hours PRN   buPROPion (WELLBUTRIN SR) 150 MG 12 hr tablet Take 150mg  once a day for 3 days, increase to 150mg  twice a  day   buPROPion (WELLBUTRIN SR) 150 mg, Oral, 2 times daily   clopidogrel (PLAVIX) 75 mg, Oral, Daily   cyanocobalamin (VITAMIN B12) 1,000 mcg, Oral, Daily   EPINEPHrine (EPI-PEN) 0.3 mg, As needed   EPINEPHrine (EPIPEN 2-PAK) 0.3 mg, Intramuscular, As needed   ferrous sulfate 325 mg, Oral, Daily   fluticasone (FLONASE) 50 MCG/ACT nasal spray 1 spray, Each Nare, Daily   Fluticasone-Umeclidin-Vilant  (TRELEGY ELLIPTA) 100-62.5-25 MCG/ACT AEPB INHALE 1 PUFF BY MOUTH EVERY DAY   Fluticasone-Umeclidin-Vilant (TRELEGY ELLIPTA) 100-62.5-25 MCG/ACT AEPB 1 puff, Daily   gabapentin (NEURONTIN) 100 mg, Oral, 2 times daily   hydrALAZINE (APRESOLINE) 25 mg, Oral, Daily PRN   ibuprofen (ADVIL) 400 mg, Oral, Every 6 hours PRN   levothyroxine (SYNTHROID) 150 mcg, Oral, Daily   MELATONIN PO 1 tablet, At bedtime PRN   methocarbamol (ROBAXIN) 500 mg, Oral, 4 times daily   Multiple Vitamins-Minerals (MULTIVITAMIN WITH MINERALS) tablet 1 tablet, Daily   nitroGLYCERIN (NITROSTAT) 0.4 mg, Sublingual, Every 5 min PRN   ondansetron (ZOFRAN-ODT) 4 mg, Oral, Every 8 hours PRN   oxyCODONE (OXY IR/ROXICODONE) 5 mg, Oral, Every 6 hours PRN   rosuvastatin (CRESTOR) 20 mg, Oral, Every evening   sennosides-docusate sodium (SENOKOT-S) 8.6-50 MG tablet 1 tablet, Oral, Daily   traMADol (ULTRAM) 50 mg, Oral, Every 8 hours PRN, Take a half tablet three times per daily as needed for pain for 7 days   Turmeric 500 mg, Daily   Vitamin D 5,000 Units, Daily   Zinc 30 mg, Oral    Diet Orders (From admission, onward)     Start     Ordered   08/17/23 1631  Diet regular Room service appropriate? Yes; Fluid consistency: Thin  Diet effective now       Question Answer Comment  Room service appropriate? Yes   Fluid consistency: Thin      08/17/23 1631            DVT prophylaxis: enoxaparin (LOVENOX) injection 40 mg Start: 08/17/23 2200   Lab Results  Component Value Date   PLT 287 08/17/2023      Code Status: Full Code  Family Communication: no family at bedside   Status is: Inpatient Remains inpatient appropriate because: severity of illness  Level of care: Med-Surg  Consultants:  none  Objective: Vitals:   08/17/23 2140 08/18/23 0111 08/18/23 0452 08/18/23 0900  BP: (!) 148/66 (!) 151/68 (!) 144/76   Pulse: 81 77 77   Resp: 17 17 17    Temp: 98.7 F (37.1 C) 98.3 F (36.8 C) 98.3 F (36.8 C)    TempSrc: Oral     SpO2: 97% 100% 97% 98%  Weight:      Height:        Intake/Output Summary (Last 24 hours) at 08/18/2023 4098 Last data filed at 08/17/2023 1800 Gross per 24 hour  Intake 120 ml  Output --  Net 120 ml   Wt Readings from Last 3 Encounters:  08/17/23 63.3 kg  07/22/23 62 kg  06/26/23 61.4 kg    Examination:  Constitutional: NAD Eyes: no scleral icterus ENMT: Mucous membranes are moist.  Neck: normal, supple Respiratory: clear to auscultation bilaterally, no wheezing, no crackles. Normal respiratory effort.  Cardiovascular: Regular rate and rhythm, no murmurs / rubs / gallops. No LE edema.  Abdomen: non distended, no tenderness. Bowel sounds positive.  Musculoskeletal: no clubbing / cyanosis.    Data Reviewed: I have independently reviewed following labs and imaging studies  CBC Recent Labs  Lab 08/17/23 1236 08/17/23 1300  WBC  --  11.0*  HGB 10.9* 10.0*  HCT 32.0* 31.0*  PLT  --  287  MCV  --  89.3  MCH  --  28.8  MCHC  --  32.3  RDW  --  13.2  LYMPHSABS  --  1.0  MONOABS  --  0.8  EOSABS  --  0.2  BASOSABS  --  0.0    Recent Labs  Lab 08/17/23 1231 08/17/23 1236 08/17/23 1430 08/17/23 1855 08/18/23 0511 08/18/23 0517 08/18/23 0520  NA  --  122* 125* 127*  --   --  124*  K  --  3.4* 3.2* 3.2*  --   --  4.0  CL  --  82* 87* 88*  --   --  90*  CO2  --   --  29 28  --   --  23  GLUCOSE  --  99 104* 103*  --   --  99  BUN  --  6* 8 8  --   --  7*  CREATININE  --  0.60 0.50 0.49  --   --  0.51  CALCIUM  --   --  9.0 8.7*  --   --  9.0  AST  --   --  31  --   --   --   --   ALT  --   --  33  --   --   --   --   ALKPHOS  --   --  58  --   --   --   --   BILITOT  --   --  0.7  --   --   --   --   ALBUMIN  --   --  3.3*  --   --   --   --   MG  --   --   --   --   --  1.8  --   LATICACIDVEN 0.6  --   --   --   --   --   --   TSH  --   --   --   --  11.104*  --   --      ------------------------------------------------------------------------------------------------------------------ No results for input(s): "CHOL", "HDL", "LDLCALC", "TRIG", "CHOLHDL", "LDLDIRECT" in the last 72 hours.  Lab Results  Component Value Date   HGBA1C 5.5 10/24/2020   ------------------------------------------------------------------------------------------------------------------ Recent Labs    08/18/23 0511  TSH 11.104*    Cardiac Enzymes No results for input(s): "CKMB", "TROPONINI", "MYOGLOBIN" in the last 168 hours.  Invalid input(s): "CK" ------------------------------------------------------------------------------------------------------------------ No results found for: "BNP"  CBG: No results for input(s): "GLUCAP" in the last 168 hours.  Recent Results (from the past 240 hours)  Resp panel by RT-PCR (RSV, Flu A&B, Covid) Anterior Nasal Swab     Status: None   Collection Time: 08/17/23 11:50 AM   Specimen: Anterior Nasal Swab  Result Value Ref Range Status   SARS Coronavirus 2 by RT PCR NEGATIVE NEGATIVE Final    Comment: (NOTE) SARS-CoV-2 target nucleic acids are NOT DETECTED.  The SARS-CoV-2 RNA is generally detectable in upper respiratory specimens during the acute phase of infection. The lowest concentration of SARS-CoV-2 viral copies this assay can detect is 138 copies/mL. A negative result does not preclude SARS-Cov-2 infection and should not be used as the sole basis for treatment or other patient management decisions. A negative result  may occur with  improper specimen collection/handling, submission of specimen other than nasopharyngeal swab, presence of viral mutation(s) within the areas targeted by this assay, and inadequate number of viral copies(<138 copies/mL). A negative result must be combined with clinical observations, patient history, and epidemiological information. The expected result is Negative.  Fact Sheet for Patients:   BloggerCourse.com  Fact Sheet for Healthcare Providers:  SeriousBroker.it  This test is no t yet approved or cleared by the [place] FDA and  has been authorized for detection and/or diagnosis of SARS-CoV-2 by FDA under an Emergency Use Authorization (EUA). This EUA will remain  in effect (meaning this test can be used) for the duration of the COVID-19 declaration under Section 564(b)(1) of the Act, 21 U.S.C.section 360bbb-3(b)(1), unless the authorization is terminated  or revoked sooner.       Influenza A by PCR NEGATIVE NEGATIVE Final   Influenza B by PCR NEGATIVE NEGATIVE Final    Comment: (NOTE) The Xpert Xpress SARS-CoV-2/FLU/RSV plus assay is intended as an aid in the diagnosis of influenza from Nasopharyngeal swab specimens and should not be used as a sole basis for treatment. Nasal washings and aspirates are unacceptable for Xpert Xpress SARS-CoV-2/FLU/RSV testing.  Fact Sheet for Patients: BloggerCourse.com  Fact Sheet for Healthcare Providers: SeriousBroker.it  This test is not yet approved or cleared by the [place] FDA and has been authorized for detection and/or diagnosis of SARS-CoV-2 by FDA under an Emergency Use Authorization (EUA). This EUA will remain in effect (meaning this test can be used) for the duration of the COVID-19 declaration under Section 564(b)(1) of the Act, 21 U.S.C. section 360bbb-3(b)(1), unless the authorization is terminated or revoked.     Resp Syncytial Virus by PCR NEGATIVE NEGATIVE Final    Comment: (NOTE) Fact Sheet for Patients: BloggerCourse.com  Fact Sheet for Healthcare Providers: SeriousBroker.it  This test is not yet approved or cleared by the [place] FDA and has been authorized for detection and/or diagnosis of SARS-CoV-2 by FDA under an Emergency Use  Authorization (EUA). This EUA will remain in effect (meaning this test can be used) for the duration of the COVID-19 declaration under Section 564(b)(1) of the Act, 21 U.S.C. section 360bbb-3(b)(1), unless the authorization is terminated or revoked.  Performed at [facility], [address], [place], [place] [address]      Radiology Studies: DG Abd 1 View Result Date: 08/17/2023 CLINICAL DATA:  Nausea and vomiting after left lower lobectomy 2 weeks ago. EXAM: ABDOMEN - 1 VIEW COMPARISON:  None Available. FINDINGS: The bowel gas pattern is normal. Mild amount of stool is noted in the colon. No radio-opaque calculi are seen. Soft tissue gas is noted in left lateral abdominal wall most likely postoperative in etiology. IMPRESSION: No abnormal bowel dilatation is noted. Soft tissue gas is noted in left lateral abdominal wall most likely related to recent thoracic surgery. Electronically Signed   By: Lupita Raider M.D.   On: 08/17/2023 17:16   DG Chest Port 1 View Result Date: 08/17/2023 CLINICAL DATA:  Nausea, vomiting, and weakness for the past 2 days. History of lung cancer. EXAM: PORTABLE CHEST 1 VIEW COMPARISON:  CT chest dated June 27, 2023. Chest x-ray dated November 03, 2020. FINDINGS: Stable cardiomediastinal silhouette status post mitral valve replacement. Normal pulmonary vascularity. Postsurgical changes in the left lung again noted. Increasing density at the left lung base with possible small left pleural effusion. The right lung is clear. New small left apical pneumothorax.  New extensive subcutaneous emphysema in the left chest and lower neck. No acute osseous abnormality. IMPRESSION: 1. New small left apical pneumothorax with extensive subcutaneous emphysema in the left chest and lower neck. 2. Increasing density at the left lung base with possible small left pleural effusion, atelectasis versus pneumonia. Electronically Signed   By: Obie Dredge M.D.   On:  08/17/2023 11:23     Pamella Pert, MD, PhD [facility]  Between 7 am - 7 pm I am available, please contact me via Amion (for emergencies) or Securechat (non urgent messages)  Between 7 pm - 7 am I am not available, please contact night coverage MD/APP via Amion

## 2023-08-19 ENCOUNTER — Ambulatory Visit (HOSPITAL_COMMUNITY): Payer: Medicare Other

## 2023-08-19 ENCOUNTER — Encounter (HOSPITAL_COMMUNITY): Payer: Self-pay | Admitting: Internal Medicine

## 2023-08-19 ENCOUNTER — Other Ambulatory Visit (HOSPITAL_COMMUNITY): Payer: Self-pay

## 2023-08-19 ENCOUNTER — Encounter (HOSPITAL_COMMUNITY): Payer: Medicare Other

## 2023-08-19 DIAGNOSIS — E871 Hypo-osmolality and hyponatremia: Secondary | ICD-10-CM | POA: Diagnosis not present

## 2023-08-19 LAB — CBC
HCT: 29 % — ABNORMAL LOW (ref 36.0–46.0)
Hemoglobin: 9.1 g/dL — ABNORMAL LOW (ref 12.0–15.0)
MCH: 28.6 pg (ref 26.0–34.0)
MCHC: 31.4 g/dL (ref 30.0–36.0)
MCV: 91.2 fL (ref 80.0–100.0)
Platelets: 249 10*3/uL (ref 150–400)
RBC: 3.18 MIL/uL — ABNORMAL LOW (ref 3.87–5.11)
RDW: 13.3 % (ref 11.5–15.5)
WBC: 7.3 10*3/uL (ref 4.0–10.5)
nRBC: 0 % (ref 0.0–0.2)

## 2023-08-19 LAB — COMPREHENSIVE METABOLIC PANEL WITH GFR
ALT: 22 U/L (ref 0–44)
AST: 26 U/L (ref 15–41)
Albumin: 2.8 g/dL — ABNORMAL LOW (ref 3.5–5.0)
Alkaline Phosphatase: 44 U/L (ref 38–126)
Anion gap: 9 (ref 5–15)
BUN: 6 mg/dL — ABNORMAL LOW (ref 8–23)
CO2: 24 mmol/L (ref 22–32)
Calcium: 8.4 mg/dL — ABNORMAL LOW (ref 8.9–10.3)
Chloride: 95 mmol/L — ABNORMAL LOW (ref 98–111)
Creatinine, Ser: 0.5 mg/dL (ref 0.44–1.00)
GFR, Estimated: >60 mL/min (ref >60)
Glucose, Bld: 93 mg/dL (ref 70–99)
Potassium: 3.3 mmol/L — ABNORMAL LOW (ref 3.5–5.1)
Sodium: 128 mmol/L — ABNORMAL LOW (ref 135–145)
Total Bilirubin: 0.2 mg/dL (ref 0.0–1.2)
Total Protein: 5.4 g/dL — ABNORMAL LOW (ref 6.5–8.1)

## 2023-08-19 LAB — PHOSPHORUS: Phosphorus: 3.6 mg/dL (ref 2.5–4.6)

## 2023-08-19 LAB — MAGNESIUM: Magnesium: 1.8 mg/dL (ref 1.7–2.4)

## 2023-08-19 MED ORDER — GLYCERIN (LAXATIVE) 2 G RE SUPP
1.0000 | Freq: Every day | RECTAL | Status: DC | PRN
  Filled 2023-08-19: qty 1

## 2023-08-19 MED ORDER — GABAPENTIN 300 MG PO CAPS
300.0000 mg | ORAL_CAPSULE | Freq: Two times a day (BID) | ORAL | Status: DC
  Administered 2023-08-19: 300 mg via ORAL
  Filled 2023-08-19: qty 1

## 2023-08-19 MED ORDER — SODIUM CHLORIDE 1 G PO TABS
1.0000 g | ORAL_TABLET | Freq: Two times a day (BID) | ORAL | 0 refills | Status: AC
  Filled 2023-08-19: qty 14, 7d supply, fill #0

## 2023-08-19 MED ORDER — LACTULOSE 10 GM/15ML PO SOLN
30.0000 g | Freq: Two times a day (BID) | ORAL | 0 refills | Status: DC | PRN
  Filled 2023-08-19: qty 946, 11d supply, fill #0

## 2023-08-19 MED ORDER — POTASSIUM CHLORIDE CRYS ER 20 MEQ PO TBCR
40.0000 meq | EXTENDED_RELEASE_TABLET | Freq: Once | ORAL | Status: AC
  Administered 2023-08-19: 40 meq via ORAL
  Filled 2023-08-19: qty 2

## 2023-08-19 MED ORDER — METHOCARBAMOL 500 MG PO TABS
500.0000 mg | ORAL_TABLET | Freq: Four times a day (QID) | ORAL | 0 refills | Status: DC | PRN
  Filled 2023-08-19: qty 30, 8d supply, fill #0

## 2023-08-19 MED ORDER — LIDOCAINE 5 % EX PTCH
1.0000 | MEDICATED_PATCH | CUTANEOUS | 0 refills | Status: AC
Start: 2023-08-20 — End: ?
  Filled 2023-08-19: qty 30, 30d supply, fill #0

## 2023-08-19 MED ORDER — MORPHINE SULFATE 15 MG PO TABS
7.5000 mg | ORAL_TABLET | Freq: Four times a day (QID) | ORAL | 0 refills | Status: DC | PRN
  Filled 2023-08-19: qty 30, 8d supply, fill #0

## 2023-08-19 MED ORDER — GABAPENTIN 300 MG PO CAPS
300.0000 mg | ORAL_CAPSULE | Freq: Two times a day (BID) | ORAL | 0 refills | Status: DC
  Filled 2023-08-19: qty 60, 30d supply, fill #0

## 2023-08-19 NOTE — Progress Notes (Signed)
 Patient refused cost of Lidocaine patches, that insurance rejected, stated she would get them over the counter.  [facility] outpatient pharmacy made aware. Sharrell Ku RN

## 2023-08-19 NOTE — Discharge Summary (Addendum)
 Physician Discharge Summary  Joy Williams:[id number] DOB: [birth year] DOA: 08/17/2023  PCP: Melida Quitter, PA  Admit date: 08/17/2023 Discharge date: 08/19/2023  Admitted From: home Disposition:  home  Recommendations for Outpatient Follow-up:  Follow up with PCP in 1-2 weeks Please obtain BMP/CBC in one week Follow up with [facility] as scheduled Please recheck TSH as an outpatient in 3 to 4 weeks  Home Health: PT Equipment/Devices: none  Discharge Condition: stable CODE STATUS: Full code Diet Orders (From admission, onward)     Start     Ordered   08/17/23 1631  Diet regular Room service appropriate? Yes; Fluid consistency: Thin  Diet effective now       Question Answer Comment  Room service appropriate? Yes   Fluid consistency: Thin      08/17/23 1631            HPI: Per admitting MD, Joy Williams is a 73 y.o. female with medical history significant for chronic hyponatremia, chronic diastolic congestive heart failure, COPD on room air, hypothyroidism, bovine mitral valve replacement who underwent recent left lower lobe lobectomy at [facility] on 3/14 and is now being admitted to the hospital with hypovolemic hyponatremia and vomiting.  History is provided by the patient, as well as her daughter with whom we spoke over the phone.  She was discharged from [facility] on 3/25, since that time she has been having nausea and pain which has been difficult to control.  She was discharged with tramadol, however she did not like how this made her feel, so she was sent to prescription for oxycodone.  The oxycodone seem to be too strong for her, made her very loopy and so she went back to taking tramadol.  She estimates she has only taken maybe 3 doses in total of tramadol since discharge from the hospital, because she did not like how it made her feel.  As such, she has had uncontrolled pain, and having difficulty sleeping.  She also has been  very obstipated which was also an issue while she was at [facility].  Since discharge from the hospital, she has just been taking Senokot once daily and has only had 1 bowel movement in the last 6 days.  She has been trying to eat, but it just makes her very nauseous.  She denies any fevers, chills, blood in her stool or emesis.  No significant cough, or any chest pain.   Hospital Course / Discharge diagnoses:  Principal problem Hyponatremia -this is likely multifactorial with a chronic component to it, recent lung issues, poor with low solute intake.  In addition, she tells me she is drinking "a lot of water", suggesting she may have an excess of free water.  Patient was placed on normal saline, salt tablets given poor solute intake and she was counseled to restrict free water as much as possible.  She has improved, sodium has stabilized around 128.  Sodium has been in the upper 120s in the last few months as well as when she was hospitalized at [facility] and this may be her current baseline.  Active problems Nausea, vomiting -likely in the setting of significant constipation, this has been a problem for her since surgery.  KUB without ileus or obstruction.  She was placed on aggressive bowel regimen with MiraLAX, Senokot, lactulose with excellent results, she has been having good bowel movements, nausea, vomiting all resolved and she is tolerating a regular diet  Lung cancer  status post recent lobectomy at [facility] -she has a known left apical pneumothorax, small, also subcutaneous emphysema.  She was unable to tolerate oxycodone at [facility] because it made her "woozy", she responded well to low-dose morphine with good pain control and no side effects.  Generalized weakness-in the setting of recent prolonged hospitalization.  PT consulted, recommended home health PT  Diastolic CHF-she looks euvolemic Carotid artery disease-continue Plavix COPD-on room air, stable, no wheezing Hyperlipidemia-continue  statin Hypothyroidism-continue Synthroid.  TSH elevated at 11, but difficult to interpret in the setting of acute illness and recent hospitalization/surgery.  In addition, patient reports taking her Synthroid with all her medications, I discussed about taking Synthroid by itself, at least half an hour before taking any other medications.  Recommend rechecking TSH in 3 to 4 weeks as an outpatient  Sepsis ruled out  Discharge Instructions   Allergies as of 08/19/2023       Reactions   Penicillins Other (See Comments), Hives   Unknown, occurred as a child Unknown, occurred as a child, Has patient had a PCN reaction causing immediate rash, facial/tongue/throat swelling, SOB or lightheadedness with hypotension: Yes, Has patient had a PCN reaction causing severe rash involving mucus membranes or skin necrosis: No, Has patient had a PCN reaction that required hospitalization , Has patient had a PCN reaction occurring within the last 10 years: No, If all of the above answers are "NO", then may proceed with Cephalosporin use., No   Bee Venom Swelling   Severe swelling at the sting site.   Ofev [nintedanib] Diarrhea        Medication List     STOP taking these medications    buPROPion 150 MG 12 hr tablet Commonly known as: Wellbutrin SR   oxyCODONE 5 MG immediate release tablet Commonly known as: Oxy IR/ROXICODONE   traMADol 50 MG tablet Commonly known as: ULTRAM       TAKE these medications    acetaminophen 500 MG tablet Commonly known as: TYLENOL Take 1,000 mg by mouth every 6 (six) hours as needed for mild pain (pain score 1-3) or headache.   clopidogrel 75 MG tablet Commonly known as: PLAVIX Take 1 tablet (75 mg total) by mouth daily.   cyanocobalamin 1000 MCG tablet Commonly known as: VITAMIN B12 Take 1 tablet (1,000 mcg total) by mouth daily.   EPINEPHrine 0.3 mg/0.3 mL Soaj injection Commonly known as: EPI-PEN Inject 0.3 mg into the muscle as needed. What changed:  Another medication with the same name was removed. Continue taking this medication, and follow the directions you see here.   ferrous sulfate 325 (65 FE) MG tablet Take 1 tablet (325 mg total) by mouth daily. What changed: additional instructions   fluticasone 50 MCG/ACT nasal spray Commonly known as: FLONASE Place 1 spray into both nostrils daily.   gabapentin 300 MG capsule Commonly known as: NEURONTIN Take 1 capsule (300 mg total) by mouth 2 (two) times daily. What changed:  medication strength how much to take   hydrALAZINE 25 MG tablet Commonly known as: APRESOLINE Take 1 tablet (25 mg total) by mouth daily as needed (for systolic BP>150).   ibuprofen 200 MG tablet Commonly known as: ADVIL Take 400 mg by mouth every 6 (six) hours as needed for moderate pain (pain score 4-6).   lactulose 10 GM/15ML solution Commonly known as: CHRONULAC Take 45 mLs (30 g total) by mouth 2 (two) times daily as needed for moderate constipation or severe constipation.   levothyroxine 150 MCG tablet  Commonly known as: SYNTHROID Take 1 tablet (150 mcg total) by mouth daily.   lidocaine 5 % Commonly known as: LIDODERM Place 1 patch onto the skin daily. Remove & Discard patch within 12 hours or as directed by MD Start taking on: August 20, 2023   MELATONIN PO Take 1 tablet by mouth at bedtime as needed (sleep).   methocarbamol 500 MG tablet Commonly known as: ROBAXIN Take 1 tablet (500 mg total) by mouth every 6 (six) hours as needed for muscle spasms. What changed:  when to take this reasons to take this   morphine 15 MG tablet Commonly known as: MSIR Take 0.5-1 tablets (7.5-15 mg total) by mouth every 6 (six) hours as needed (breakthrough pain).   multivitamin with minerals tablet Take 1 tablet by mouth daily.   nitroGLYCERIN 0.4 MG SL tablet Commonly known as: NITROSTAT Place 0.4 mg under the tongue every 5 (five) minutes as needed.   ondansetron 4 MG disintegrating  tablet Commonly known as: ZOFRAN-ODT Take 4 mg by mouth every 8 (eight) hours as needed for nausea or vomiting.   rosuvastatin 20 MG tablet Commonly known as: Crestor Take 1 tablet (20 mg total) by mouth every evening.   sennosides-docusate sodium 8.6-50 MG tablet Commonly known as: SENOKOT-S Take 1 tablet by mouth daily.   sodium chloride 1 g tablet Take 1 tablet (1 g total) by mouth 2 (two) times daily with a meal for 7 days.   Trelegy Ellipta 100-62.5-25 MCG/ACT Aepb Generic drug: Fluticasone-Umeclidin-Vilant Inhale 1 puff into the lungs daily.   Trelegy Ellipta 100-62.5-25 MCG/ACT Aepb Generic drug: Fluticasone-Umeclidin-Vilant INHALE 1 PUFF BY MOUTH EVERY DAY   Turmeric 500 MG Caps Take 500 mg by mouth daily.   Vitamin D 125 MCG (5000 UT) Caps Take 5,000 Units by mouth daily.   Zinc 30 MG Tabs Take 30 mg by mouth.        Follow-up Information     [facility], [facility] Follow up.   Specialty: Home Health Services Why: Frances Furbish will follow up with you at discharge to provide home health services Contact information: [address] [place] [address] [phone]                 Consultations: none  Procedures/Studies:  DG Abd 1 View Result Date: 08/17/2023 CLINICAL DATA:  Nausea and vomiting after left lower lobectomy 2 weeks ago. EXAM: ABDOMEN - 1 VIEW COMPARISON:  None Available. FINDINGS: The bowel gas pattern is normal. Mild amount of stool is noted in the colon. No radio-opaque calculi are seen. Soft tissue gas is noted in left lateral abdominal wall most likely postoperative in etiology. IMPRESSION: No abnormal bowel dilatation is noted. Soft tissue gas is noted in left lateral abdominal wall most likely related to recent thoracic surgery. Electronically Signed   By: Lupita Raider M.D.   On: 08/17/2023 17:16   DG Chest Port 1 View Result Date: 08/17/2023 CLINICAL DATA:  Nausea, vomiting, and weakness for the past 2 days. History  of lung cancer. EXAM: PORTABLE CHEST 1 VIEW COMPARISON:  CT chest dated June 27, 2023. Chest x-ray dated November 03, 2020. FINDINGS: Stable cardiomediastinal silhouette status post mitral valve replacement. Normal pulmonary vascularity. Postsurgical changes in the left lung again noted. Increasing density at the left lung base with possible small left pleural effusion. The right lung is clear. New small left apical pneumothorax. New extensive subcutaneous emphysema in the left chest and lower neck. No acute osseous abnormality.  IMPRESSION: 1. New small left apical pneumothorax with extensive subcutaneous emphysema in the left chest and lower neck. 2. Increasing density at the left lung base with possible small left pleural effusion, atelectasis versus pneumonia. Electronically Signed   By: Obie Dredge M.D.   On: 08/17/2023 11:23     Subjective: - no chest pain, shortness of breath, no abdominal pain, nausea or vomiting.   Discharge Exam: BP 131/69 (BP Location: Left Arm)   Pulse 73   Temp 97.6 F (36.4 C) (Oral)   Resp 17   Ht 5' 6.5" (1.689 m)   Wt 63.3 kg   SpO2 100%   BMI 22.19 kg/m   General: Pt is alert, awake, not in acute distress Cardiovascular: RRR, S1/S2 +, no rubs, no gallops Respiratory: CTA bilaterally, no wheezing, no rhonchi Abdominal: Soft, NT, ND, bowel sounds + Extremities: no edema, no cyanosis  The results of significant diagnostics from this hospitalization (including imaging, microbiology, ancillary and laboratory) are listed below for reference.     Microbiology: Recent Results (from the past 240 hours)  Resp panel by RT-PCR (RSV, Flu A&B, Covid) Anterior Nasal Swab     Status: None   Collection Time: 08/17/23 11:50 AM   Specimen: Anterior Nasal Swab  Result Value Ref Range Status   SARS Coronavirus 2 by RT PCR NEGATIVE NEGATIVE Final    Comment: (NOTE) SARS-CoV-2 target nucleic acids are NOT DETECTED.  The SARS-CoV-2 RNA is generally detectable in  upper respiratory specimens during the acute phase of infection. The lowest concentration of SARS-CoV-2 viral copies this assay can detect is 138 copies/mL. A negative result does not preclude SARS-Cov-2 infection and should not be used as the sole basis for treatment or other patient management decisions. A negative result may occur with  improper specimen collection/handling, submission of specimen other than nasopharyngeal swab, presence of viral mutation(s) within the areas targeted by this assay, and inadequate number of viral copies(<138 copies/mL). A negative result must be combined with clinical observations, patient history, and epidemiological information. The expected result is Negative.  Fact Sheet for Patients:  BloggerCourse.com  Fact Sheet for Healthcare Providers:  SeriousBroker.it  This test is no t yet approved or cleared by the [place] FDA and  has been authorized for detection and/or diagnosis of SARS-CoV-2 by FDA under an Emergency Use Authorization (EUA). This EUA will remain  in effect (meaning this test can be used) for the duration of the COVID-19 declaration under Section 564(b)(1) of the Act, 21 U.S.C.section 360bbb-3(b)(1), unless the authorization is terminated  or revoked sooner.       Influenza A by PCR NEGATIVE NEGATIVE Final   Influenza B by PCR NEGATIVE NEGATIVE Final    Comment: (NOTE) The Xpert Xpress SARS-CoV-2/FLU/RSV plus assay is intended as an aid in the diagnosis of influenza from Nasopharyngeal swab specimens and should not be used as a sole basis for treatment. Nasal washings and aspirates are unacceptable for Xpert Xpress SARS-CoV-2/FLU/RSV testing.  Fact Sheet for Patients: BloggerCourse.com  Fact Sheet for Healthcare Providers: SeriousBroker.it  This test is not yet approved or cleared by the [place] FDA and has been  authorized for detection and/or diagnosis of SARS-CoV-2 by FDA under an Emergency Use Authorization (EUA). This EUA will remain in effect (meaning this test can be used) for the duration of the COVID-19 declaration under Section 564(b)(1) of the Act, 21 U.S.C. section 360bbb-3(b)(1), unless the authorization is terminated or revoked.     Resp Syncytial Virus by  PCR NEGATIVE NEGATIVE Final    Comment: (NOTE) Fact Sheet for Patients: BloggerCourse.com  Fact Sheet for Healthcare Providers: SeriousBroker.it  This test is not yet approved or cleared by the [place] FDA and has been authorized for detection and/or diagnosis of SARS-CoV-2 by FDA under an Emergency Use Authorization (EUA). This EUA will remain in effect (meaning this test can be used) for the duration of the COVID-19 declaration under Section 564(b)(1) of the Act, 21 U.S.C. section 360bbb-3(b)(1), unless the authorization is terminated or revoked.  Performed at [facility], [address], [place], [place] [address]      Labs: Basic Metabolic Panel: Recent Labs  Lab 08/17/23 1855 08/18/23 0517 08/18/23 0520 08/18/23 1027 08/18/23 1953 08/19/23 0540  NA 127*  --  124* 127* 124* 128*  K 3.2*  --  4.0 3.5 3.5 3.3*  CL 88*  --  90* 90* 92* 95*  CO2 28  --  23 26 24 24   GLUCOSE 103*  --  99 99 102* 93  BUN 8  --  7* 6* 6* 6*  CREATININE 0.49  --  0.51 0.45 0.52 0.50  CALCIUM 8.7*  --  9.0 8.8* 8.1* 8.4*  MG  --  1.8  --   --   --  1.8  PHOS  --   --   --   --   --  3.6   Liver Function Tests: Recent Labs  Lab 08/17/23 1430 08/19/23 0540  AST 31 26  ALT 33 22  ALKPHOS 58 44  BILITOT 0.7 0.2  PROT 6.2* 5.4*  ALBUMIN 3.3* 2.8*   CBC: Recent Labs  Lab 08/17/23 1236 08/17/23 1300 08/19/23 0540  WBC  --  11.0* 7.3  NEUTROABS  --  8.7*  --   HGB 10.9* 10.0* 9.1*  HCT 32.0* 31.0* 29.0*  MCV  --  89.3 91.2  PLT  --  287 249    CBG: No results for input(s): "GLUCAP" in the last 168 hours. Hgb A1c No results for input(s): "HGBA1C" in the last 72 hours. Lipid Profile No results for input(s): "CHOL", "HDL", "LDLCALC", "TRIG", "CHOLHDL", "LDLDIRECT" in the last 72 hours. Thyroid function studies Recent Labs    08/18/23 0511  TSH 11.104*   Urinalysis    Component Value Date/Time   COLORURINE YELLOW 08/17/2023 1142   APPEARANCEUR CLEAR 08/17/2023 1142   LABSPEC 1.011 08/17/2023 1142   PHURINE 7.0 08/17/2023 1142   GLUCOSEU NEGATIVE 08/17/2023 1142   HGBUR NEGATIVE 08/17/2023 1142   BILIRUBINUR NEGATIVE 08/17/2023 1142   BILIRUBINUR negative 10/14/2017 1020   KETONESUR NEGATIVE 08/17/2023 1142   PROTEINUR NEGATIVE 08/17/2023 1142   UROBILINOGEN 0.2 10/14/2017 1020   NITRITE NEGATIVE 08/17/2023 1142   LEUKOCYTESUR NEGATIVE 08/17/2023 1142    FURTHER DISCHARGE INSTRUCTIONS:   Get Medicines reviewed and adjusted: Please take all your medications with you for your next visit with your Primary MD   Laboratory/radiological data: Please request your Primary MD to go over all hospital tests and procedure/radiological results at the follow up, please ask your Primary MD to get all Hospital records sent to his/her office.   In some cases, they will be blood work, cultures and biopsy results pending at the time of your discharge. Please request that your primary care M.D. goes through all the records of your hospital data and follows up on these results.   Also Note the following: If you experience worsening of your admission symptoms, develop shortness of breath,  life threatening emergency, suicidal or homicidal thoughts you must seek medical attention immediately by calling 911 or calling your MD immediately  if symptoms less severe.   You must read complete instructions/literature along with all the possible adverse reactions/side effects for all the Medicines you take and that have been prescribed to you.  Take any new Medicines after you have completely understood and accpet all the possible adverse reactions/side effects.    Do not drive when taking Pain medications or sleeping medications (Benzodaizepines)   Do not take more than prescribed Pain, Sleep and Anxiety Medications. It is not advisable to combine anxiety,sleep and pain medications without talking with your primary care practitioner   Special Instructions: If you have smoked or chewed Tobacco  in the last 2 yrs please stop smoking, stop any regular Alcohol  and or any Recreational drug use.   Wear Seat belts while driving.   Please note: You were cared for by a hospitalist during your hospital stay. Once you are discharged, your primary care physician will handle any further medical issues. Please note that NO REFILLS for any discharge medications will be authorized once you are discharged, as it is imperative that you return to your primary care physician (or establish a relationship with a primary care physician if you do not have one) for your post hospital discharge needs so that they can reassess your need for medications and monitor your lab values.  Time coordinating discharge: 40 minutes  SIGNED:  Pamella Pert, MD, PhD 08/19/2023, 2:20 PM

## 2023-08-19 NOTE — TOC Progression Note (Signed)
 Transition of Care [facility]) - Progression Note    Patient Details  Name: Joy Williams MRN: [id number] Date of Birth: Oct 13, 1950  Transition of Care [facility]) CM/SW Contact  Otelia Santee, LCSW Phone Number: 08/19/2023, 10:44 AM  Clinical Narrative:    OT added to pt's discharge HH services. Orders for HHPT/OT will need to be placed prior to discharge.    Expected Discharge Plan: Home w Home Health Services Barriers to Discharge: No Barriers Identified  Expected Discharge Plan and Services In-house Referral: Clinical Social Work Discharge Planning Services: NA Post Acute Care Choice: Home Health Living arrangements for the past 2 months: Single Family Home                 DME Arranged: N/A DME Agency: NA                   Social Determinants of Health (SDOH) Interventions SDOH Screenings   Food Insecurity: No Food Insecurity (08/17/2023)  Housing: Low Risk  (08/17/2023)  Transportation Needs: No Transportation Needs (08/17/2023)  Utilities: Not At Risk (08/17/2023)  Alcohol Screen: Low Risk  (09/04/2022)  Depression (PHQ2-9): Low Risk  (07/24/2023)  Financial Resource Strain: Low Risk  (08/01/2023)   Received from [facility]  Physical Activity: Insufficiently Active (09/04/2022)  Social Connections: Moderately Integrated (08/18/2023)  Stress: No Stress Concern Present (09/04/2022)  Tobacco Use: Medium Risk (07/31/2023)   Received from [facility]    Readmission Risk Interventions    08/18/2023    1:46 PM  Readmission Risk Prevention Plan  Post Dischage Appt Complete  Medication Screening Complete  Transportation Screening Complete

## 2023-08-19 NOTE — Progress Notes (Signed)
 Discharge instructions given to patient and daughter questions answered. Discharge meds delivered to bedside D [facility]

## 2023-08-19 NOTE — Progress Notes (Signed)
 Mobility Specialist - Progress Note   08/19/23 1155  Mobility  Activity Ambulated independently in hallway;Ambulated independently to bathroom  Level of Assistance Independent  Assistive Device None  Distance Ambulated (ft) 500 ft  Activity Response Tolerated well  Mobility Referral Yes  Mobility visit 1 Mobility  Mobility Specialist Start Time (ACUTE ONLY) 1133  Mobility Specialist Stop Time (ACUTE ONLY) 1152  Mobility Specialist Time Calculation (min) (ACUTE ONLY) 19 min   Pt received in bed and agreeable to mobility. No complaints during session. Pt to recliner for meal after session with all needs met.    [facility]

## 2023-08-20 ENCOUNTER — Telehealth: Payer: Self-pay

## 2023-08-20 NOTE — Transitions of Care (Post Inpatient/ED Visit) (Signed)
   08/20/2023  Name: Joy Williams MRN: [id number] DOB: 01/04/1951  Today's TOC FU Call Status: Today's TOC FU Call Status:: Unsuccessful Call (1st Attempt) Unsuccessful Call (1st Attempt) Date: 08/20/23  Attempted to reach the patient regarding the most recent Inpatient/ED visit. Patient was called in an Outreach attempt to offer VBCI  30-day TOC program. Pt is eligible for program due to potential risk for readmission and/or high utilization. Unfortunately, I was not able to speak with the patient in regards to recent hospital discharge  I was unable to leave a message per recording call could not be completed verified phone number and attempted    Follow Up Plan: Additional outreach attempts will be made to reach the patient to complete the Transitions of Care (Post Inpatient/ED visit) call.   Susa Loffler , BSN, RN [facility]   VBCI-Population Health RN Care Manager Direct Dial [phone]  Fax: [phone] Website: [email or web address]

## 2023-08-21 ENCOUNTER — Encounter (HOSPITAL_COMMUNITY): Payer: Medicare Other

## 2023-08-21 ENCOUNTER — Ambulatory Visit (HOSPITAL_COMMUNITY): Payer: Medicare Other

## 2023-08-21 ENCOUNTER — Telehealth: Payer: Self-pay

## 2023-08-21 DIAGNOSIS — Z952 Presence of prosthetic heart valve: Secondary | ICD-10-CM | POA: Diagnosis not present

## 2023-08-21 DIAGNOSIS — Z79899 Other long term (current) drug therapy: Secondary | ICD-10-CM | POA: Diagnosis not present

## 2023-08-21 DIAGNOSIS — Z87891 Personal history of nicotine dependence: Secondary | ICD-10-CM | POA: Diagnosis not present

## 2023-08-21 DIAGNOSIS — J939 Pneumothorax, unspecified: Secondary | ICD-10-CM | POA: Diagnosis not present

## 2023-08-21 DIAGNOSIS — C3482 Malignant neoplasm of overlapping sites of left bronchus and lung: Secondary | ICD-10-CM | POA: Diagnosis not present

## 2023-08-21 NOTE — Transitions of Care (Post Inpatient/ED Visit) (Signed)
 08/21/2023  Name: Joy Williams MRN: [id number] DOB: 12-11-1950  Today's TOC FU Call Status: Today's TOC FU Call Status:: Successful TOC FU Call Completed TOC FU Call Complete Date: 08/21/23 Patient's Name and Date of Birth confirmed.  Transition Care Management Follow-up Telephone Call Discharge Facility: [facility]) How have you been since you were released from the hospital?: Better (Reports doing ok. Patient reports pain is managed.) Any questions or concerns?: No  Items Reviewed: Did you receive and understand the discharge instructions provided?: Yes Medications obtained,verified, and reconciled?: Yes (Medications Reviewed) Any new allergies since your discharge?: No Dietary orders reviewed?: Yes Type of Diet Ordered:: regular diet. free water restriction Do you have support at home?: Yes People in Home: child(ren), adult Name of Support/Comfort Primary Source: stephanie daughter  Medications Reviewed Today: Medications Reviewed Today     Reviewed by Earlie Server, RN (Registered Nurse) on 08/21/23 at 1127  Med List Status: <None>   Medication Order Taking? Sig Documenting Provider Last Dose Status Informant  acetaminophen (TYLENOL) 500 MG tablet [id number] Yes Take 1,000 mg by mouth every 6 (six) hours as needed for mild pain (pain score 1-3) or headache. [provider] Taking Active Self  Cholecalciferol (VITAMIN D) 125 MCG (5000 UT) CAPS [id number] Yes Take 5,000 Units by mouth daily. [provider] Taking Active Self  clopidogrel (PLAVIX) 75 MG tablet [id number] Yes Take 1 tablet (75 mg total) by mouth daily. Parke Poisson, MD Taking Active Self  EPINEPHrine 0.3 mg/0.3 mL IJ SOAJ injection [id number] No Inject 0.3 mg into the muscle as needed.  Patient not taking: Reported on 08/13/2023   [provider] Not Taking Active Self  ferrous sulfate 325 (65 FE) MG tablet [id number] Yes Take 1 tablet (325 mg total) by mouth daily.  Patient  taking differently: Take 325 mg by mouth daily. OTC   Parke Poisson, MD Taking Active Self  fluticasone (FLONASE) 50 MCG/ACT nasal spray [id number] Yes Place 1 spray into both nostrils daily. [provider] Taking Active Self  Fluticasone-Umeclidin-Vilant (TRELEGY ELLIPTA) 100-62.5-25 MCG/ACT AEPB [id number] Yes INHALE 1 PUFF BY MOUTH EVERY DAY Kalman Shan, MD Taking Active Self  Fluticasone-Umeclidin-Vilant (TRELEGY ELLIPTA) 100-62.5-25 MCG/ACT AEPB [id number] Yes Inhale 1 puff into the lungs daily. [provider] Taking Active Self  gabapentin (NEURONTIN) 300 MG capsule [id number] Yes Take 1 capsule (300 mg total) by mouth 2 (two) times daily. Leatha Gilding, MD Taking Active   hydrALAZINE (APRESOLINE) 25 MG tablet [id number] Yes Take 1 tablet (25 mg total) by mouth daily as needed (for systolic BP>150). [name], MD Taking Active Self  ibuprofen (ADVIL) 200 MG tablet [id number] Yes Take 400 mg by mouth every 6 (six) hours as needed for moderate pain (pain score 4-6). [provider] Taking Active Self  lactulose (CHRONULAC) 10 GM/15ML solution [id number] Yes Take 45 mLs (30 g total) by mouth 2 (two) times daily as needed for moderate constipation or severe constipation. Leatha Gilding, MD Taking Active   levothyroxine (SYNTHROID) 150 MCG tablet [id number] Yes Take 1 tablet (150 mcg total) by mouth daily. Melida Quitter, PA Taking Active Self  lidocaine (LIDODERM) 5 % [id number] Yes Place 1 patch onto the skin daily. Remove & Discard patch within 12 hours or as directed by MD Leatha Gilding, MD Taking Active   MELATONIN PO [id number] Yes Take 1 tablet by mouth at bedtime as needed (sleep). [provider] Taking Active Self  Med Note Ernest Mallick, MINDY L   Wed Nov 15, 2020  1:13 PM)    methocarbamol (ROBAXIN) 500 MG tablet [id number] Yes Take 1 tablet (500 mg total) by mouth every 6 (six) hours as needed for muscle spasms. Leatha Gilding, MD Taking Active   morphine (MSIR) 15 MG tablet [id number] Yes Take 0.5-1 tablets (7.5-15 mg total) by mouth every 6 (six) hours as needed (breakthrough pain). Leatha Gilding, MD Taking Active   Multiple Vitamins-Minerals (MULTIVITAMIN WITH MINERALS) tablet [id number] Yes Take 1 tablet by mouth daily. [provider] Taking Active Self  nitroGLYCERIN (NITROSTAT) 0.4 MG SL tablet [id number] No Place 0.4 mg under the tongue every 5 (five) minutes as needed.  Patient not taking: Reported on 08/21/2023   [provider] Not Taking Active Self           Med Note Jomarie Longs, REGEENA   Sun Aug 17, 2023  3:36 PM) Unk last dose  ondansetron (ZOFRAN-ODT) 4 MG disintegrating tablet [id number] No Take 4 mg by mouth every 8 (eight) hours as needed for nausea or vomiting.  Patient not taking: Reported on 08/21/2023   [provider] Not Taking Active Self           Med Note Lenor Derrick   Sun Aug 17, 2023  3:36 PM) Not started  rosuvastatin (CRESTOR) 20 MG tablet [id number] Yes Take 1 tablet (20 mg total) by mouth every evening. [name], MD Taking Active Self  sennosides-docusate sodium (SENOKOT-S) 8.6-50 MG tablet [id number] Yes Take 1 tablet by mouth daily. [provider] Taking Active Self  sodium chloride 1 g tablet [id number] Yes Take 1 tablet (1 g total) by mouth 2 (two) times daily with a meal for 7 days. Leatha Gilding, MD Taking Active   Turmeric 500 MG CAPS [id number] Yes Take 500 mg by mouth daily. [provider] Taking Active Self  vitamin B-12 (CYANOCOBALAMIN) 1000 MCG tablet [id number] Yes Take 1 tablet (1,000 mcg total) by mouth daily. Joseph Art, DO Taking Active Self  Zinc 30 MG TABS [id number] Yes Take 30 mg by mouth. [provider] Taking Active Self            Home Care and Equipment/Supplies: Were Home Health Services Ordered?: Yes [facility] of [facility] Health Agency:: [facility] -  was previously scheduled when  patient readmitted. (Spoke with Irving Burton and she will call patient later today. patient informed.) Has Agency set up a time to come to your home?: No Any new equipment or medical supplies ordered?: No  Functional Questionnaire: Do you need assistance with bathing/showering or dressing?: No Do you need assistance with meal preparation?: No Do you need assistance with eating?: No Do you have difficulty maintaining continence: No Do you need assistance with getting out of bed/getting out of a chair/moving?: No Do you have difficulty managing or taking your medications?: No  Follow up appointments reviewed: PCP Follow-up appointment confirmed?: Yes MD Provider Line Number:[phone] Given: Yes Date of PCP follow-up appointment?: 08/26/23 Follow-up Provider: [facility] Follow-up appointment confirmed?: Yes Date of Specialist follow-up appointment?: 08/21/23 Follow-Up Specialty Provider:: [facility] Do you need transportation to your follow-up appointment?: No Do you understand care options if your condition(s) worsen?: Yes-patient verbalized understanding  SDOH Interventions Today    Flowsheet Row Most Recent Value  SDOH Interventions   Food Insecurity Interventions Intervention Not Indicated  Housing Interventions Intervention Not Indicated  Transportation Interventions Intervention Not Indicated  Utilities Interventions Intervention Not Indicated  Interventions Today    Flowsheet Row Most Recent Value  Chronic Disease   Chronic disease during today's visit Other  [hyponatremia]  General Interventions   General Interventions Discussed/Reviewed Doctor Visits  Doctor Visits Discussed/Reviewed Doctor Visits Discussed, PCP, Specialist  PCP/Specialist Visits Compliance with follow-up visit  Education Interventions   Education Provided Provided Printed Education  Provided Verbal Education On Nutrition, Medication  Nutrition Interventions   Nutrition  Discussed/Reviewed Nutrition Discussed, Fluid intake  [reviewed fluid restriction]      TOC Interventions Today    Flowsheet Row Most Recent Value  TOC Interventions   TOC Interventions Discussed/Reviewed TOC Interventions Discussed, TOC Interventions Reviewed, Contacted Home Health RN/OT/PT      Placed call to patient who is on her way to [facility] for follow up with surgeon. Patient reports that she is doing better. Reports drinking gatorade vs water. States taking all medications as prescribed including morphine.  Reports she is no longer constipated. Bowels are moving well. States her only concern is getting PT started.  Placed call to [facility] and spoke with Irving Burton who will call patient later today for scheduling. Patient informed.  No additional questions noted.  Lonia Chimera, RN, BSN, CEN [employer]- Transition of Care Team.  [facility] [phone]

## 2023-08-23 DIAGNOSIS — I2510 Atherosclerotic heart disease of native coronary artery without angina pectoris: Secondary | ICD-10-CM | POA: Diagnosis not present

## 2023-08-23 DIAGNOSIS — D6481 Anemia due to antineoplastic chemotherapy: Secondary | ICD-10-CM | POA: Diagnosis not present

## 2023-08-23 DIAGNOSIS — M341 CR(E)ST syndrome: Secondary | ICD-10-CM | POA: Diagnosis not present

## 2023-08-23 DIAGNOSIS — D630 Anemia in neoplastic disease: Secondary | ICD-10-CM | POA: Diagnosis not present

## 2023-08-23 DIAGNOSIS — T451X5D Adverse effect of antineoplastic and immunosuppressive drugs, subsequent encounter: Secondary | ICD-10-CM | POA: Diagnosis not present

## 2023-08-23 DIAGNOSIS — Z953 Presence of xenogenic heart valve: Secondary | ICD-10-CM | POA: Diagnosis not present

## 2023-08-23 DIAGNOSIS — I110 Hypertensive heart disease with heart failure: Secondary | ICD-10-CM | POA: Diagnosis not present

## 2023-08-23 DIAGNOSIS — E785 Hyperlipidemia, unspecified: Secondary | ICD-10-CM | POA: Diagnosis not present

## 2023-08-23 DIAGNOSIS — Z7951 Long term (current) use of inhaled steroids: Secondary | ICD-10-CM | POA: Diagnosis not present

## 2023-08-23 DIAGNOSIS — I252 Old myocardial infarction: Secondary | ICD-10-CM | POA: Diagnosis not present

## 2023-08-23 DIAGNOSIS — Z7902 Long term (current) use of antithrombotics/antiplatelets: Secondary | ICD-10-CM | POA: Diagnosis not present

## 2023-08-23 DIAGNOSIS — E039 Hypothyroidism, unspecified: Secondary | ICD-10-CM | POA: Diagnosis not present

## 2023-08-23 DIAGNOSIS — J4489 Other specified chronic obstructive pulmonary disease: Secondary | ICD-10-CM | POA: Diagnosis not present

## 2023-08-23 DIAGNOSIS — Z79899 Other long term (current) drug therapy: Secondary | ICD-10-CM | POA: Diagnosis not present

## 2023-08-23 DIAGNOSIS — Z483 Aftercare following surgery for neoplasm: Secondary | ICD-10-CM | POA: Diagnosis not present

## 2023-08-23 DIAGNOSIS — I5032 Chronic diastolic (congestive) heart failure: Secondary | ICD-10-CM | POA: Diagnosis not present

## 2023-08-23 DIAGNOSIS — Z9181 History of falling: Secondary | ICD-10-CM | POA: Diagnosis not present

## 2023-08-23 DIAGNOSIS — Z87891 Personal history of nicotine dependence: Secondary | ICD-10-CM | POA: Diagnosis not present

## 2023-08-23 DIAGNOSIS — J8410 Pulmonary fibrosis, unspecified: Secondary | ICD-10-CM | POA: Diagnosis not present

## 2023-08-23 DIAGNOSIS — Z8673 Personal history of transient ischemic attack (TIA), and cerebral infarction without residual deficits: Secondary | ICD-10-CM | POA: Diagnosis not present

## 2023-08-23 DIAGNOSIS — C3482 Malignant neoplasm of overlapping sites of left bronchus and lung: Secondary | ICD-10-CM | POA: Diagnosis not present

## 2023-08-23 DIAGNOSIS — Z902 Acquired absence of lung [part of]: Secondary | ICD-10-CM | POA: Diagnosis not present

## 2023-08-26 ENCOUNTER — Ambulatory Visit (INDEPENDENT_AMBULATORY_CARE_PROVIDER_SITE_OTHER): Admitting: Family Medicine

## 2023-08-26 ENCOUNTER — Ambulatory Visit (HOSPITAL_COMMUNITY): Payer: Medicare Other

## 2023-08-26 ENCOUNTER — Encounter: Payer: Self-pay | Admitting: Family Medicine

## 2023-08-26 ENCOUNTER — Encounter (HOSPITAL_COMMUNITY): Payer: Medicare Other

## 2023-08-26 VITALS — BP 111/69 | HR 71 | Ht 66.5 in | Wt 138.1 lb

## 2023-08-26 DIAGNOSIS — C3492 Malignant neoplasm of unspecified part of left bronchus or lung: Secondary | ICD-10-CM

## 2023-08-26 DIAGNOSIS — E039 Hypothyroidism, unspecified: Secondary | ICD-10-CM | POA: Diagnosis not present

## 2023-08-26 DIAGNOSIS — Z91030 Bee allergy status: Secondary | ICD-10-CM

## 2023-08-26 DIAGNOSIS — E871 Hypo-osmolality and hyponatremia: Secondary | ICD-10-CM | POA: Diagnosis not present

## 2023-08-26 MED ORDER — NITROGLYCERIN 0.4 MG SL SUBL: 0.4000 mg | SUBLINGUAL_TABLET | SUBLINGUAL | 1 refills | Status: AC | PRN

## 2023-08-26 MED ORDER — EPINEPHRINE 0.3 MG/0.3ML IJ SOAJ: 0.3000 mg | INTRAMUSCULAR | 1 refills | Status: AC | PRN

## 2023-08-26 NOTE — Patient Instructions (Signed)
 It was nice to see you today,  We addressed the following topics today: -We will need to recheck your CBC and CMP labs in the next 1 to 2 weeks. - Your TSH will need to be checked in about 3 to 4 weeks - We will follow-up with you in 4 months and recheck TSH again prior to that visit - I have sent in refills of your epinephrine pen and your nitroglycerin tablets.  Have a great day,  Frederic Jericho, MD

## 2023-08-26 NOTE — Progress Notes (Signed)
   Established Patient Office Visit  Subjective   Patient ID: Joy Williams, female    DOB: 1950-06-11  Age: 73 y.o. MRN: [id number]  Chief Complaint  Patient presents with   Hospitalization Follow-up    HPI  Patient here today for follow-up of hospitalization for hyponatremia.  She recently had a left lower lobectomy at [facility] on 3/14.  After discharge from their was home for a few days before coming to the emergency department for vomiting and was found to have hyponatremia and was admitted for hyponatremia treatment.  She is doing better since returning home from this hospitalization.  Overall she feels like her energy level is "low".  Home health PT and OT has been set up and is coming this week.  We discussed her elevated TSH level in the hospital, needing to wait a few more weeks before checking it again due to the effect acute illness has on thyroid levels.  Patient's pain is better with the morphine, gabapentin and Robaxin.  She did not tolerate oxycodone or tramadol previously.  She has a upcoming oncology appointment in 4 weeks.  She saw the surgeon last week who performed her lobectomy.     The ASCVD Risk score (Arnett DK, et al., 2019) failed to calculate for the following reasons:   Risk score cannot be calculated because patient has a medical history suggesting prior/existing ASCVD  Health Maintenance Due  Topic Date Due   Colonoscopy  Never done   Pneumonia Vaccine 103+ Years old (2 of 2 - PPSV23) 03/19/2017   MAMMOGRAM  02/23/2023   COVID-19 Vaccine (6 - Moderna risk 2024-25 season) 07/28/2023   Medicare Annual Wellness (AWV)  09/04/2023      Objective:     BP 111/69   Pulse 71   Ht 5' 6.5" (1.689 m)   Wt 138 lb 1.9 oz (62.7 kg)   SpO2 (!) 69%   BMI 21.96 kg/m    Physical Exam General: Alert, oriented.  Frail-appearing female accompanied by daughter. CV: Regular rate and rhythm Pulmonary: Lungs clear bilaterally   No results found for  any visits on 08/26/23.      Assessment & Plan:   Hyponatremia Assessment & Plan: Will check CMP, serum osmolality.  Also check urine sodium and osmolality as well.  Orders: -     Comprehensive metabolic panel with GFR; Future -     Osmolality; Future -     CBC with Differential/Platelet; Future -     Magnesium; Future -     Osmolality, urine; Future -     Sodium, urine, random; Future  Bee sting allergy Assessment & Plan: Sending in a new EpiPen to replace expired EpiPen   Malignant neoplasm of left lung, unspecified part of lung [facility]) Assessment & Plan: Recent left lower lobectomy performed at [facility].  Has upcoming appointment with her oncologist in the next month.  Continue gabapentin, morphine, Robaxin for pain relief   Other orders -     Nitroglycerin; Place 1 tablet (0.4 mg total) under the tongue every 5 (five) minutes as needed.  Dispense: 10 tablet; Refill: 1 -     EPINEPHrine; Inject 0.3 mg into the muscle as needed.  Dispense: 1 each; Refill: 1     Return in about 4 months (around 12/26/2023) for thyroid, lung ca.    Laneta Pintos, MD

## 2023-08-27 DIAGNOSIS — J4489 Other specified chronic obstructive pulmonary disease: Secondary | ICD-10-CM | POA: Diagnosis not present

## 2023-08-27 DIAGNOSIS — Z483 Aftercare following surgery for neoplasm: Secondary | ICD-10-CM | POA: Diagnosis not present

## 2023-08-27 DIAGNOSIS — T451X5D Adverse effect of antineoplastic and immunosuppressive drugs, subsequent encounter: Secondary | ICD-10-CM | POA: Diagnosis not present

## 2023-08-27 DIAGNOSIS — C3482 Malignant neoplasm of overlapping sites of left bronchus and lung: Secondary | ICD-10-CM | POA: Diagnosis not present

## 2023-08-27 DIAGNOSIS — D6481 Anemia due to antineoplastic chemotherapy: Secondary | ICD-10-CM | POA: Diagnosis not present

## 2023-08-27 DIAGNOSIS — D630 Anemia in neoplastic disease: Secondary | ICD-10-CM | POA: Diagnosis not present

## 2023-08-28 ENCOUNTER — Encounter (HOSPITAL_COMMUNITY): Payer: Medicare Other

## 2023-08-28 ENCOUNTER — Ambulatory Visit (HOSPITAL_COMMUNITY): Payer: Medicare Other

## 2023-08-29 DIAGNOSIS — T451X5D Adverse effect of antineoplastic and immunosuppressive drugs, subsequent encounter: Secondary | ICD-10-CM | POA: Diagnosis not present

## 2023-08-29 DIAGNOSIS — D6481 Anemia due to antineoplastic chemotherapy: Secondary | ICD-10-CM | POA: Diagnosis not present

## 2023-08-29 DIAGNOSIS — D630 Anemia in neoplastic disease: Secondary | ICD-10-CM | POA: Diagnosis not present

## 2023-08-29 DIAGNOSIS — C3482 Malignant neoplasm of overlapping sites of left bronchus and lung: Secondary | ICD-10-CM | POA: Diagnosis not present

## 2023-08-29 DIAGNOSIS — J4489 Other specified chronic obstructive pulmonary disease: Secondary | ICD-10-CM | POA: Diagnosis not present

## 2023-08-29 DIAGNOSIS — Z483 Aftercare following surgery for neoplasm: Secondary | ICD-10-CM | POA: Diagnosis not present

## 2023-08-30 NOTE — Assessment & Plan Note (Signed)
 Recent left lower lobectomy performed at [facility].  Has upcoming appointment with her oncologist in the next month.  Continue gabapentin, morphine, Robaxin for pain relief

## 2023-08-30 NOTE — Assessment & Plan Note (Signed)
 Will check CMP, serum osmolality.  Also check urine sodium and osmolality as well.

## 2023-08-30 NOTE — Assessment & Plan Note (Signed)
 Sending in a new EpiPen to replace expired EpiPen

## 2023-09-01 NOTE — Addendum Note (Signed)
 Addended by: ZIMMERMAN RUMPLE, Gentri Guardado D on: 09/01/2023 05:06 PM   Modules accepted: Orders

## 2023-09-02 ENCOUNTER — Ambulatory Visit (HOSPITAL_COMMUNITY): Payer: Medicare Other

## 2023-09-02 ENCOUNTER — Encounter (HOSPITAL_COMMUNITY): Payer: Medicare Other

## 2023-09-03 ENCOUNTER — Other Ambulatory Visit

## 2023-09-03 DIAGNOSIS — E039 Hypothyroidism, unspecified: Secondary | ICD-10-CM | POA: Diagnosis not present

## 2023-09-03 DIAGNOSIS — E871 Hypo-osmolality and hyponatremia: Secondary | ICD-10-CM | POA: Diagnosis not present

## 2023-09-04 ENCOUNTER — Encounter (HOSPITAL_COMMUNITY): Payer: Medicare Other

## 2023-09-04 ENCOUNTER — Ambulatory Visit (HOSPITAL_COMMUNITY): Payer: Medicare Other

## 2023-09-04 LAB — CBC WITH DIFFERENTIAL/PLATELET
Basophils Absolute: 0 10*3/uL (ref 0.0–0.2)
Basos: 0 %
EOS (ABSOLUTE): 0.1 10*3/uL (ref 0.0–0.4)
Eos: 1 %
Hematocrit: 31.7 % — ABNORMAL LOW (ref 34.0–46.6)
Hemoglobin: 10.4 g/dL — ABNORMAL LOW (ref 11.1–15.9)
Immature Grans (Abs): 0.1 10*3/uL (ref 0.0–0.1)
Immature Granulocytes: 1 %
Lymphocytes Absolute: 0.8 10*3/uL (ref 0.7–3.1)
Lymphs: 11 %
MCH: 29.3 pg (ref 26.6–33.0)
MCHC: 32.8 g/dL (ref 31.5–35.7)
MCV: 89 fL (ref 79–97)
Monocytes Absolute: 0.5 10*3/uL (ref 0.1–0.9)
Monocytes: 7 %
Neutrophils Absolute: 6 10*3/uL (ref 1.4–7.0)
Neutrophils: 80 %
Platelets: 308 10*3/uL (ref 150–450)
RBC: 3.55 x10E6/uL — ABNORMAL LOW (ref 3.77–5.28)
RDW: 12.4 % (ref 11.7–15.4)
WBC: 7.5 10*3/uL (ref 3.4–10.8)

## 2023-09-04 LAB — COMPREHENSIVE METABOLIC PANEL WITH GFR
ALT: 17 IU/L (ref 0–32)
AST: 25 IU/L (ref 0–40)
Albumin: 4 g/dL (ref 3.8–4.8)
Alkaline Phosphatase: 98 IU/L (ref 44–121)
BUN/Creatinine Ratio: 24 (ref 12–28)
BUN: 14 mg/dL (ref 8–27)
Bilirubin Total: <0.2 mg/dL (ref 0.0–1.2)
CO2: 25 mmol/L (ref 20–29)
Calcium: 9.2 mg/dL (ref 8.7–10.3)
Chloride: 94 mmol/L — ABNORMAL LOW (ref 96–106)
Creatinine, Ser: 0.58 mg/dL (ref 0.57–1.00)
Globulin, Total: 2.5 g/dL (ref 1.5–4.5)
Glucose: 71 mg/dL (ref 70–99)
Potassium: 4.3 mmol/L (ref 3.5–5.2)
Sodium: 135 mmol/L (ref 134–144)
Total Protein: 6.5 g/dL (ref 6.0–8.5)
eGFR: 96 mL/min/{1.73_m2} (ref >59)

## 2023-09-04 LAB — OSMOLALITY: Osmolality Meas: 269 mosm/kg — ABNORMAL LOW (ref 280–301)

## 2023-09-04 LAB — TSH: TSH: 6.24 u[IU]/mL — ABNORMAL HIGH (ref 0.450–4.500)

## 2023-09-04 LAB — OSMOLALITY, URINE: Osmolality, Ur: 423 mosm/kg

## 2023-09-04 LAB — SODIUM, URINE, RANDOM: Sodium, Ur: 91 mmol/L

## 2023-09-04 LAB — MAGNESIUM: Magnesium: 2 mg/dL (ref 1.6–2.3)

## 2023-09-09 ENCOUNTER — Ambulatory Visit (HOSPITAL_COMMUNITY): Payer: Medicare Other

## 2023-09-09 ENCOUNTER — Telehealth (HOSPITAL_COMMUNITY): Payer: Self-pay

## 2023-09-09 ENCOUNTER — Encounter (HOSPITAL_COMMUNITY): Payer: Medicare Other

## 2023-09-09 DIAGNOSIS — J4489 Other specified chronic obstructive pulmonary disease: Secondary | ICD-10-CM | POA: Diagnosis not present

## 2023-09-09 DIAGNOSIS — Z483 Aftercare following surgery for neoplasm: Secondary | ICD-10-CM | POA: Diagnosis not present

## 2023-09-09 DIAGNOSIS — T451X5D Adverse effect of antineoplastic and immunosuppressive drugs, subsequent encounter: Secondary | ICD-10-CM | POA: Diagnosis not present

## 2023-09-09 DIAGNOSIS — D630 Anemia in neoplastic disease: Secondary | ICD-10-CM | POA: Diagnosis not present

## 2023-09-09 DIAGNOSIS — D6481 Anemia due to antineoplastic chemotherapy: Secondary | ICD-10-CM | POA: Diagnosis not present

## 2023-09-09 DIAGNOSIS — C3482 Malignant neoplasm of overlapping sites of left bronchus and lung: Secondary | ICD-10-CM | POA: Diagnosis not present

## 2023-09-09 NOTE — Telephone Encounter (Signed)
 Received a message from [name] from [facility] wanting to have patient attend [facility] again, wanting to know if a new referral was required. Left message returning their call informing them we do need a new referral.  Joy Williams called shortly after stating she wants to attend [facility] again. Informed patient we will call her when we have her new referral ready to get her scheduled.

## 2023-09-11 ENCOUNTER — Other Ambulatory Visit (HOSPITAL_COMMUNITY): Payer: Self-pay

## 2023-09-11 ENCOUNTER — Encounter (HOSPITAL_COMMUNITY): Payer: Medicare Other

## 2023-09-11 ENCOUNTER — Ambulatory Visit (HOSPITAL_COMMUNITY): Payer: Medicare Other

## 2023-09-12 ENCOUNTER — Telehealth (HOSPITAL_COMMUNITY): Payer: Self-pay

## 2023-09-12 NOTE — Telephone Encounter (Signed)
 Pt insurance is active and benefits verified through Medicaid a/b Co-pay 0, DED $257/$257 met, out of pocket 0/0 met, co-insurance 20%. no pre-authorization required. Passport, 09/12/2023@10 Sharri Dee, REF# [id number]

## 2023-09-12 NOTE — Telephone Encounter (Signed)
 Called patient to see if she was interested in participating in the [facility]. Reviewed follow up appointment with pt about restrictions and also having referrals for PT and OT. Clarification confirmed and pt expressed interest in scheduling. Patient will come in for orientation on 4/30 and will attend the 1015 exercise class.  MyChart message sent.

## 2023-09-12 NOTE — Telephone Encounter (Signed)
 RN LVM for patient to review medical history prior to schedule for Pulm Rehab. Awaiting call back.

## 2023-09-13 ENCOUNTER — Other Ambulatory Visit (HOSPITAL_COMMUNITY): Payer: Self-pay

## 2023-09-15 ENCOUNTER — Other Ambulatory Visit: Payer: Self-pay | Admitting: Family Medicine

## 2023-09-15 NOTE — Telephone Encounter (Signed)
 Copied from CRM [id number]. Topic: Clinical - Medication Refill >> Sep 15, 2023  8:09 AM Baldemar Lev wrote: Most Recent Primary Care Visit:  Provider: [facility]  Department: [facility]  Visit Type: LAB VISIT  Date: 09/03/2023  Medication: gabapentin  (NEURONTIN ) 300 MG capsule methocarbamol  (ROBAXIN ) 500 MG tablet   Has the patient contacted their pharmacy? Yes (Agent: If no, request that the patient contact the pharmacy for the refill. If patient does not wish to contact the pharmacy document the reason why and proceed with request.) (Agent: If yes, when and what did the pharmacy advise?)  Is this the correct pharmacy for this prescription? Yes If no, delete pharmacy and type the correct one.  This is the patient's preferred pharmacy:   [facility] [address] [place] - [address] [place] [address] Phone: [phone] Fax: [phone]  Has the prescription been filled recently? Yes  Is the patient out of the medication? Yes  Has the patient been seen for an appointment in the last year OR does the patient have an upcoming appointment? Yes  Can we respond through MyChart? Yes  Agent: Please be advised that Rx refills may take up to 3 business days. We ask that you follow-up with your pharmacy.

## 2023-09-16 ENCOUNTER — Ambulatory Visit: Payer: Self-pay

## 2023-09-16 ENCOUNTER — Encounter (HOSPITAL_COMMUNITY): Payer: Medicare Other

## 2023-09-16 ENCOUNTER — Other Ambulatory Visit: Payer: Self-pay | Admitting: Family Medicine

## 2023-09-16 MED ORDER — GABAPENTIN 300 MG PO CAPS: 300.0000 mg | ORAL_CAPSULE | Freq: Two times a day (BID) | ORAL | 2 refills | Status: DC

## 2023-09-16 NOTE — Telephone Encounter (Signed)
 Please see note below in regard to patient request.   Copied from CRM [id number]. Topic: Clinical - Medication Question >> Sep 15, 2023  4:17 PM Tiffany S wrote: Reason for CRM: Patient is asking if prescription can be expedited due to her being out of medication.   Patient is asking what nerve block medication can be called in now until gabapentin  is approved please follow up with patient

## 2023-09-17 ENCOUNTER — Encounter (HOSPITAL_COMMUNITY): Payer: Self-pay

## 2023-09-17 ENCOUNTER — Encounter (HOSPITAL_COMMUNITY)
Admission: RE | Admit: 2023-09-17 | Discharge: 2023-09-17 | Disposition: A | Discharge status: Home / Self Care | Source: Ambulatory Visit | Attending: Pulmonary Disease | Admitting: Pulmonary Disease

## 2023-09-17 VITALS — BP 126/82 | HR 71 | Wt 137.1 lb

## 2023-09-17 DIAGNOSIS — J449 Chronic obstructive pulmonary disease, unspecified: Secondary | ICD-10-CM | POA: Insufficient documentation

## 2023-09-17 MED ORDER — METHOCARBAMOL 500 MG PO TABS: 500.0000 mg | ORAL_TABLET | Freq: Four times a day (QID) | ORAL | 2 refills | Status: DC | PRN

## 2023-09-17 NOTE — Progress Notes (Signed)
 Pulmonary Individual Treatment Plan  Patient Details  Name: Joy Williams MRN: [id number] Date of Birth: Mar 11, 1951 Referring Provider:   Gattis Kass Pulmonary Rehab Walk Test from 09/17/2023 in [facility]  Referring Provider [place]       Initial Encounter Date:  Flowsheet Row Pulmonary Rehab Walk Test from 09/17/2023 in [facility]  Date 09/17/23       Visit Diagnosis: Chronic obstructive pulmonary disease, unspecified COPD type (HCC)  Patient's Home Medications on Admission:   Current Outpatient Medications:    acetaminophen  (TYLENOL ) 500 MG tablet, Take 1,000 mg by mouth every 6 (six) hours as needed for mild pain (pain score 1-3) or headache., Disp: , Rfl:    Cholecalciferol (VITAMIN D ) 125 MCG (5000 UT) CAPS, Take 5,000 Units by mouth daily., Disp: , Rfl:    clopidogrel  (PLAVIX ) 75 MG tablet, Take 1 tablet (75 mg total) by mouth daily., Disp: 90 tablet, Rfl: 2   EPINEPHrine  0.3 mg/0.3 mL IJ SOAJ injection, Inject 0.3 mg into the muscle as needed., Disp: 1 each, Rfl: 1   ferrous sulfate  325 (65 FE) MG tablet, Take 1 tablet (325 mg total) by mouth daily. (Patient taking differently: Take 325 mg by mouth daily. OTC), Disp: 90 tablet, Rfl: 3   fluticasone  (FLONASE ) 50 MCG/ACT nasal spray, Place 1 spray into both nostrils daily., Disp: , Rfl:    Fluticasone -Umeclidin-Vilant (TRELEGY ELLIPTA ) 100-62.5-25 MCG/ACT AEPB, INHALE 1 PUFF BY MOUTH EVERY DAY, Disp: 60 each, Rfl: 5   Fluticasone -Umeclidin-Vilant (TRELEGY ELLIPTA ) 100-62.5-25 MCG/ACT AEPB, Inhale 1 puff into the lungs daily., Disp: , Rfl:    gabapentin  (NEURONTIN ) 300 MG capsule, Take 1 capsule (300 mg total) by mouth 2 (two) times daily., Disp: 60 capsule, Rfl: 2   hydrALAZINE  (APRESOLINE ) 25 MG tablet, Take 1 tablet (25 mg total) by mouth daily as needed (for systolic BP>150)., Disp: 30 tablet, Rfl: 9    ibuprofen (ADVIL) 200 MG tablet, Take 400 mg by mouth every 6 (six) hours as needed for moderate pain (pain score 4-6)., Disp: , Rfl:    lactulose  (CHRONULAC ) 10 GM/15ML solution, Take 45 mLs (30 g total) by mouth 2 (two) times daily as needed for moderate constipation or severe constipation., Disp: 946 mL, Rfl: 0   levothyroxine  (SYNTHROID ) 150 MCG tablet, Take 1 tablet (150 mcg total) by mouth daily., Disp: 90 tablet, Rfl: 3   lidocaine  (LIDODERM ) 5 %, Place 1 patch onto the skin daily. Remove & Discard patch within 12 hours or as directed by MD, Disp: 30 patch, Rfl: 0   MELATONIN PO, Take 1 tablet by mouth at bedtime as needed (sleep)., Disp: , Rfl:    methocarbamol  (ROBAXIN ) 500 MG tablet, Take 1 tablet (500 mg total) by mouth every 6 (six) hours as needed for muscle spasms., Disp: 30 tablet, Rfl: 2   morphine  (MSIR) 15 MG tablet, Take 0.5-1 tablets (7.5-15 mg total) by mouth every 6 (six) hours as needed (breakthrough pain)., Disp: 30 tablet, Rfl: 0   Multiple Vitamins-Minerals (MULTIVITAMIN WITH MINERALS) tablet, Take 1 tablet by mouth daily., Disp: , Rfl:    nitroGLYCERIN  (NITROSTAT ) 0.4 MG SL tablet, Place 1 tablet (0.4 mg total) under the tongue every 5 (five) minutes as needed., Disp: 10 tablet, Rfl: 1   rosuvastatin  (CRESTOR ) 20 MG tablet, Take 1 tablet (20 mg total) by mouth every evening., Disp: 90 tablet, Rfl: 3   sennosides-docusate sodium  (  SENOKOT-S) 8.6-50 MG tablet, Take 1 tablet by mouth daily., Disp: , Rfl:    Turmeric 500 MG CAPS, Take 500 mg by mouth daily., Disp: , Rfl:    vitamin B-12 (CYANOCOBALAMIN ) 1000 MCG tablet, Take 1 tablet (1,000 mcg total) by mouth daily., Disp: , Rfl:    Zinc 30 MG TABS, Take 30 mg by mouth., Disp: , Rfl:   Past Medical History: Past Medical History:  Diagnosis Date   Allergy    Asthma    pt denies this, but is on Dulera    Carotid artery disease (HCC) 2019   1-39% stenosis by Dopplers    Chronic diastolic congestive heart failure (HCC)     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   Complication of anesthesia    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 [facility] (had to have botox injection)   COPD (chronic obstructive pulmonary disease) (HCC)    Family history of adverse reaction to anesthesia    Mother- very sensitive to medication   Heart murmur    MVP   Hypothyroidism    Lung cancer (HCC) 12/30/2014   Synchronous primaries:  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, each treated by wedge resection with post-op adjuvant chemoRx at [facility]   MVP (mitral valve prolapse) 2018   s/p Minimally-Invasive Mitral Valve Replacement w/  [facility] Mitral bovine bioprosthetic tissue valve (size 31mm, model # 7300TFX, serial # [id number])   PONV (postoperative nausea and vomiting)    Raynaud's syndrome    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   31 mm Edwards Magna mitral bovine bioprosthetic tissue valve placed via right mini thoracotomy approach   Severe mitral regurgitation 11/15/2014   Shortness of breath dyspnea    with exertion   STD (sexually transmitted disease)    STEMI (ST elevation myocardial infarction) (HCC) 10/24/2020   Telangiectasia     Tobacco Use: Social History   Tobacco Use  Smoking Status Former   Current packs/day: 0.00   Average packs/day: 1 pack/day for 40.1 years (40.1 ttl pk-yrs)   Types: Cigarettes   Start date: 05/21/1983   Quit date: 06/12/2023   Years since quitting: 0.2   Passive exposure: Never  Smokeless Tobacco Never  Tobacco Comments   Quit 06/12/23    Labs: Review Flowsheet  More data exists      Latest Ref Rng & Units 08/07/2020 10/24/2020 05/30/2022 08/28/2022 08/17/2023  Labs for ITP Cardiac and Pulmonary Rehab  Cholestrol 100 - 199 mg/dL 454  098  119  147  -  LDL (calc) 0 - 99 mg/dL 50  55  54  55  -  HDL-C >39 mg/dL 46  60  66  67  -  Trlycerides 0 - 149 mg/dL 829  562  98  130  -  Hemoglobin A1c 4.8 - 5.6 % 5.6  5.5  - - -  TCO2 22 - 32 mmol/L  - 24  - - 29     Capillary Blood Glucose: Lab Results  Component Value Date   GLUCAP 92 01/07/2023   GLUCAP 94 11/03/2020   GLUCAP 85 11/03/2020   GLUCAP 81 03/11/2018   GLUCAP 86 07/03/2016     Pulmonary Assessment Scores:  Pulmonary Assessment Scores     Row Name 07/07/23 1021 07/24/23 1537 07/29/23 1531     ADL UCSD   ADL Phase Entry Exit Exit   SOB Score total 6 7 --     CAT Score  CAT Score 11 12 --     mMRC Score   mMRC Score 2 -- 2    Row Name 09/17/23 1127         ADL UCSD   ADL Phase Entry     SOB Score total 31       CAT Score   CAT Score 7       mMRC Score   mMRC Score 2             UCSD: Self-administered rating of dyspnea associated with activities of daily living (ADLs) 6-point scale (0 = "not at all" to 5 = "maximal or unable to do because of breathlessness")  Scoring Scores range from 0 to 120.  Minimally important difference is 5 units  CAT: CAT can identify the health impairment of COPD patients and is better correlated with disease progression.  CAT has a scoring range of zero to 40. The CAT score is classified into four groups of low (less than 10), medium (10 - 20), high (21-30) and very high (31-40) based on the impact level of disease on health status. A CAT score over 10 suggests significant symptoms.  A worsening CAT score could be explained by an exacerbation, poor medication adherence, poor inhaler technique, or progression of COPD or comorbid conditions.  CAT MCID is 2 points  mMRC: mMRC (Modified Medical Research Council) Dyspnea Scale is used to assess the degree of baseline functional disability in patients of respiratory disease due to dyspnea. No minimal important difference is established. A decrease in score of 1 point or greater is considered a positive change.   Pulmonary Function Assessment:  Pulmonary Function Assessment - 09/17/23 1109       Breath   Bilateral Breath Sounds Rales    Shortness of Breath  Yes;Limiting activity;Fear of Shortness of Breath             Exercise Target Goals: Exercise Program Goal: Individual exercise prescription set using results from initial 6 min walk test and THRR while considering  patient's activity barriers and safety.   Exercise Prescription Goal: Initial exercise prescription builds to 30-45 minutes a day of aerobic activity, 2-3 days per week.  Home exercise guidelines will be given to patient during program as part of exercise prescription that the participant will acknowledge.  Activity Barriers & Risk Stratification:  Activity Barriers & Cardiac Risk Stratification - 09/17/23 1135       Activity Barriers & Cardiac Risk Stratification   Activity Barriers Deconditioning;Muscular Weakness;Shortness of Breath;Incisional Pain    Cardiac Risk Stratification Moderate             6 Minute Walk:  6 Minute Walk     Row Name 07/07/23 1109 07/29/23 1518 09/17/23 1052     6 Minute Walk   Phase Initial Initial Initial   Distance 1300 feet 1460 feet 790 feet   Distance % Change -- 12.6 % --   Distance Feet Change -- 160 ft --   Walk Time 6 minutes 6 minutes 6 minutes   # of Rest Breaks 0 0 2  1:50-2:00, 4:10-4:34   MPH 2.46 2.77 0.6   METS 3.1 3.17 1.95   RPE 9 11 13    Perceived Dyspnea  0 1 1   VO2 Peak 10.85 11.09 6.84   Symptoms No No No   Resting HR 67 bpm 64 bpm 71 bpm   Resting BP 146/82 120/60 126/82   Resting Oxygen Saturation  100 % 91 %  100 %   Exercise Oxygen Saturation  during 6 min walk 95 % 91 % 95 %   Max Ex. HR 84 bpm 93 bpm 95 bpm   Max Ex. BP 160/80 116/74 134/70   2 Minute Post BP 170/80 114/70 120/70     Interval HR   1 Minute HR 71 74 82   2 Minute HR 78 85 86   3 Minute HR 80 86 90   4 Minute HR 79 85 95   5 Minute HR 82 92 95   6 Minute HR 84 93 93   2 Minute Post HR 68 77 85   Interval Heart Rate? Yes -- Yes     Interval Oxygen   Interval Oxygen? Yes -- Yes   Baseline Oxygen Saturation % 100 % 95 %  100 %   1 Minute Oxygen Saturation % 97 % 91 % 96 %   1 Minute Liters of Oxygen 0 L 0 L 0 L   2 Minute Oxygen Saturation % 95 % 93 % 98 %   2 Minute Liters of Oxygen 0 L 0 L 0 L   3 Minute Oxygen Saturation % 98 % 92 % 97 %   3 Minute Liters of Oxygen 0 L 0 L 0 L   4 Minute Oxygen Saturation % 98 % 96 % 95 %   4 Minute Liters of Oxygen 0 L 0 L 0 L   5 Minute Oxygen Saturation % 99 % 94 % 96 %   5 Minute Liters of Oxygen 0 L 0 L 0 L   6 Minute Oxygen Saturation % 98 % 96 % 94 %   6 Minute Liters of Oxygen 0 L 0 L 0 L   2 Minute Post Oxygen Saturation % 99 % 97 % 100 %   2 Minute Post Liters of Oxygen 0 L 0 L 0 L            Oxygen Initial Assessment:  Oxygen Initial Assessment - 09/17/23 1109       Home Oxygen   Home Oxygen Device None    Sleep Oxygen Prescription None    Home Exercise Oxygen Prescription None    Home Resting Oxygen Prescription None      Initial 6 min Walk   Oxygen Used None      Program Oxygen Prescription   Program Oxygen Prescription None      Intervention   Short Term Goals To learn and understand importance of maintaining oxygen saturations>88%;To learn and demonstrate proper use of respiratory medications;To learn and understand importance of monitoring SPO2 with pulse oximeter and demonstrate accurate use of the pulse oximeter.;To learn and demonstrate proper pursed lip breathing techniques or other breathing techniques.     Long  Term Goals Maintenance of O2 saturations>88%;Compliance with respiratory medication;Verbalizes importance of monitoring SPO2 with pulse oximeter and return demonstration;Exhibits proper breathing techniques, such as pursed lip breathing or other method taught during program session;Demonstrates proper use of MDI's             Oxygen Re-Evaluation:   Oxygen Discharge (Final Oxygen Re-Evaluation):   Initial Exercise Prescription:  Initial Exercise Prescription - 09/17/23 1000       Date of Initial Exercise RX  and Referring Provider   Date 09/17/23    Referring Provider D'amico    Expected Discharge Date 12/11/23      NuStep   Level 1    SPM 60    Minutes 15  METs 1.5      Track   Minutes 15    METs 1.95      Prescription Details   Frequency (times per week) 2    Duration Progress to 30 minutes of continuous aerobic without signs/symptoms of physical distress      Intensity   THRR 40-80% of Max Heartrate 59-132    Ratings of Perceived Exertion 11-13    Perceived Dyspnea 0-4      Progression   Progression Continue to progress workloads to maintain intensity without signs/symptoms of physical distress.      Resistance Training   Training Prescription Yes    Weight red bands    Reps 10-15             Perform Capillary Blood Glucose checks as needed.  Exercise Prescription Changes:   Exercise Prescription Changes     Row Name 07/22/23 1500             Response to Exercise   Blood Pressure (Admit) 102/60       Blood Pressure (Exercise) 122/70       Blood Pressure (Exit) 92/56       Heart Rate (Admit) 65 bpm       Heart Rate (Exercise) 82 bpm       Heart Rate (Exit) 71 bpm       Oxygen Saturation (Admit) 98 %       Oxygen Saturation (Exercise) 96 %       Oxygen Saturation (Exit) 96 %       Rating of Perceived Exertion (Exercise) 11       Perceived Dyspnea (Exercise) 0.5       Duration Continue with 30 min of aerobic exercise without signs/symptoms of physical distress.       Intensity THRR unchanged         Progression   Progression Continue to progress workloads to maintain intensity without signs/symptoms of physical distress.         Resistance Training   Training Prescription Yes       Weight blue bands       Reps 10-15       Time 10 Minutes         Treadmill   MPH 2.5       Grade 1.5       Minutes 215       METs 2.8         Bike   Level 2.5       Minutes 15       METs 3.3                Exercise Comments:   Exercise Comments      Row Name 07/08/23 1540           Exercise Comments Pt completed first day of exercise. She walked on the treadmill for 15 min, speed 1.7 mph, METs 2.2. She then exercised on the scifit bike for 15 min, level 1, METs 2.6. tolerated well. Performed warm up and cool down without restrictions. Discussed METs with good understanding.                Exercise Goals and Review:   Exercise Goals     Row Name 07/07/23 1022 09/17/23 1056           Exercise Goals   Increase Physical Activity Yes Yes      Intervention Provide advice, education, support and counseling about physical activity/exercise  needs.;Develop an individualized exercise prescription for aerobic and resistive training based on initial evaluation findings, risk stratification, comorbidities and participant's personal goals. Provide advice, education, support and counseling about physical activity/exercise needs.;Develop an individualized exercise prescription for aerobic and resistive training based on initial evaluation findings, risk stratification, comorbidities and participant's personal goals.      Expected Outcomes Short Term: Attend rehab on a regular basis to increase amount of physical activity.;Long Term: Add in home exercise to make exercise part of routine and to increase amount of physical activity.;Long Term: Exercising regularly at least 3-5 days a week. Short Term: Attend rehab on a regular basis to increase amount of physical activity.;Long Term: Add in home exercise to make exercise part of routine and to increase amount of physical activity.;Long Term: Exercising regularly at least 3-5 days a week.      Increase Strength and Stamina Yes Yes      Intervention Provide advice, education, support and counseling about physical activity/exercise needs.;Develop an individualized exercise prescription for aerobic and resistive training based on initial evaluation findings, risk stratification, comorbidities and  participant's personal goals. Provide advice, education, support and counseling about physical activity/exercise needs.;Develop an individualized exercise prescription for aerobic and resistive training based on initial evaluation findings, risk stratification, comorbidities and participant's personal goals.      Expected Outcomes Short Term: Increase workloads from initial exercise prescription for resistance, speed, and METs.;Short Term: Perform resistance training exercises routinely during rehab and add in resistance training at home;Long Term: Improve cardiorespiratory fitness, muscular endurance and strength as measured by increased METs and functional capacity ( ) Short Term: Increase workloads from initial exercise prescription for resistance, speed, and METs.;Short Term: Perform resistance training exercises routinely during rehab and add in resistance training at home;Long Term: Improve cardiorespiratory fitness, muscular endurance and strength as measured by increased METs and functional capacity ( )      Able to understand and use rate of perceived exertion (RPE) scale Yes Yes      Intervention Provide education and explanation on how to use RPE scale Provide education and explanation on how to use RPE scale      Expected Outcomes Short Term: Able to use RPE daily in rehab to express subjective intensity level;Long Term:  Able to use RPE to guide intensity level when exercising independently Short Term: Able to use RPE daily in rehab to express subjective intensity level;Long Term:  Able to use RPE to guide intensity level when exercising independently      Able to understand and use Dyspnea scale Yes Yes      Intervention Provide education and explanation on how to use Dyspnea scale Provide education and explanation on how to use Dyspnea scale      Expected Outcomes Short Term: Able to use Dyspnea scale daily in rehab to express subjective sense of shortness of breath during exertion;Long  Term: Able to use Dyspnea scale to guide intensity level when exercising independently Short Term: Able to use Dyspnea scale daily in rehab to express subjective sense of shortness of breath during exertion;Long Term: Able to use Dyspnea scale to guide intensity level when exercising independently      Knowledge and understanding of Target Heart Rate Range (THRR) Yes Yes      Intervention Provide education and explanation of THRR including how the numbers were predicted and where they are located for reference Provide education and explanation of THRR including how the numbers were predicted and where they are located for reference  Expected Outcomes Short Term: Able to state/look up THRR;Long Term: Able to use THRR to govern intensity when exercising independently;Short Term: Able to use daily as guideline for intensity in rehab Short Term: Able to state/look up THRR;Long Term: Able to use THRR to govern intensity when exercising independently;Short Term: Able to use daily as guideline for intensity in rehab      Able to check pulse independently -- Yes      Intervention -- Provide education and demonstration on how to check pulse in carotid and radial arteries.;Review the importance of being able to check your own pulse for safety during independent exercise      Expected Outcomes -- Short Term: Able to explain why pulse checking is important during independent exercise;Long Term: Able to check pulse independently and accurately      Understanding of Exercise Prescription Yes Yes      Intervention Provide education, explanation, and written materials on patient's individual exercise prescription Provide education, explanation, and written materials on patient's individual exercise prescription      Expected Outcomes Short Term: Able to explain program exercise prescription;Long Term: Able to explain home exercise prescription to exercise independently Short Term: Able to explain program exercise  prescription;Long Term: Able to explain home exercise prescription to exercise independently               Exercise Goals Re-Evaluation :  Exercise Goals Re-Evaluation     Row Name 07/08/23 0744 07/29/23 1532           Exercise Goal Re-Evaluation   Exercise Goals Review Increase Physical Activity;Able to understand and use Dyspnea scale;Understanding of Exercise Prescription;Increase Strength and Stamina;Knowledge and understanding of Target Heart Rate Range (THRR);Able to understand and use rate of perceived exertion (RPE) scale Increase Physical Activity;Able to understand and use Dyspnea scale;Understanding of Exercise Prescription;Increase Strength and Stamina;Knowledge and understanding of Target Heart Rate Range (THRR);Able to understand and use rate of perceived exertion (RPE) scale      Comments Pt to begin exercise 07/08/23. Will progress as tolerated. Pt completed 6 exercise sessions. She needed to graduate early due to the need for lung resection. She plans to get another referral for PR after she recovers. She exercised on the treadmill, speed 2.5, incline 1.5, METs 3.1. She then exercises on the scifit bike for 15 min level 2.5, METs 3.5. Tolerated well. Increased her 6 min walk test by 12.6 %, 160 ft. Her SOB, CAT, and MMRC scores did not change with the short duration. Encouraged pt to continue walking at home.      Expected Outcomes Through exercise at rehab and home, the patient will decrease shortness of breath with daily activities and feel confident in carrying out an exercise regimen at home Through exercise at rehab and home, the patient will decrease shortness of breath with daily activities and feel confident in carrying out an exercise regimen at home               Discharge Exercise Prescription (Final Exercise Prescription Changes):  Exercise Prescription Changes - 07/22/23 1500       Response to Exercise   Blood Pressure (Admit) 102/60    Blood Pressure  (Exercise) 122/70    Blood Pressure (Exit) 92/56    Heart Rate (Admit) 65 bpm    Heart Rate (Exercise) 82 bpm    Heart Rate (Exit) 71 bpm    Oxygen Saturation (Admit) 98 %    Oxygen Saturation (Exercise) 96 %    Oxygen  Saturation (Exit) 96 %    Rating of Perceived Exertion (Exercise) 11    Perceived Dyspnea (Exercise) 0.5    Duration Continue with 30 min of aerobic exercise without signs/symptoms of physical distress.    Intensity THRR unchanged      Progression   Progression Continue to progress workloads to maintain intensity without signs/symptoms of physical distress.      Resistance Training   Training Prescription Yes    Weight blue bands    Reps 10-15    Time 10 Minutes      Treadmill   MPH 2.5    Grade 1.5    Minutes 215    METs 2.8      Bike   Level 2.5    Minutes 15    METs 3.3             Nutrition:  Target Goals: Understanding of nutrition guidelines, daily intake of sodium 1500mg , cholesterol 200mg , calories 30% from fat and 7% or less from saturated fats, daily to have 5 or more servings of fruits and vegetables.  Biometrics:  Pre Biometrics - 09/17/23 1049       Pre Biometrics   Grip Strength 22 kg             Post Biometrics - 07/29/23 1531        Post  Biometrics   Grip Strength 24 kg             Nutrition Therapy Plan and Nutrition Goals:   Nutrition Assessments:  Nutrition Assessments - 07/24/23 1542       MEDFICTS Scores   Post Score 64   Pt didn't return pre-form           MEDIFICTS Score Key: >=70 Need to make dietary changes  40-70 Heart Healthy Diet <= 40 Therapeutic Level Cholesterol Diet  Flowsheet Row PULMONARY REHAB OTHER RESPIRATORY from 07/24/2023 in [facility]  Picture Your Plate Total Score on Discharge 64      Picture Your Plate Scores: <91 Unhealthy dietary pattern with much room for improvement. 41-50 Dietary pattern unlikely to  meet recommendations for good health and room for improvement. 51-60 More healthful dietary pattern, with some room for improvement.  >60 Healthy dietary pattern, although there may be some specific behaviors that could be improved.    Nutrition Goals Re-Evaluation:   Nutrition Goals Discharge (Final Nutrition Goals Re-Evaluation):   Psychosocial: Target Goals: Acknowledge presence or absence of significant depression and/or stress, maximize coping skills, provide positive support system. Participant is able to verbalize types and ability to use techniques and skills needed for reducing stress and depression.  Initial Review & Psychosocial Screening:  Initial Psych Review & Screening - 09/17/23 1107       Initial Review   Current issues with Current Stress Concerns    Source of Stress Concerns Chronic Illness;Unable to participate in former interests or hobbies    Comments Carol recently had lung surgery. She is dealing with the stress of not being able to breath like she used to. She hopes exercise will help her stress.      Family Dynamics   Good Support System? Yes    Comments son and daughter      Barriers   Psychosocial barriers to participate in program The patient should benefit from training in stress management and relaxation.      Screening Interventions   Interventions Encouraged to exercise  Expected Outcomes Short Term goal: Utilizing psychosocial counselor, staff and physician to assist with identification of specific Stressors or current issues interfering with healing process. Setting desired goal for each stressor or current issue identified.;Long Term Goal: Stressors or current issues are controlled or eliminated.;Short Term goal: Identification and review with participant of any Quality of Life or Depression concerns found by scoring the questionnaire.;Long Term goal: The participant improves quality of Life and PHQ9 Scores as seen by post scores and/or  verbalization of changes             Quality of Life Scores:  Scores of 19 and below usually indicate a poorer quality of life in these areas.  A difference of  2-3 points is a clinically meaningful difference.  A difference of 2-3 points in the total score of the Quality of Life Index has been associated with significant improvement in overall quality of life, self-image, physical symptoms, and general health in studies assessing change in quality of life.  PHQ-9: Review Flowsheet  More data exists      09/17/2023 08/26/2023 07/24/2023 07/07/2023 09/04/2022  Depression screen PHQ 2/9  Decreased Interest 0 0 0 0 0  Down, Depressed, Hopeless 0 0 0 0 0  PHQ - 2 Score 0 0 0 0 0  Altered sleeping 0 1 0 0 -  Tired, decreased energy 2 1 1 1  -  Change in appetite 0 0 0 0 -  Feeling bad or failure about yourself  0 0 0 0 -  Trouble concentrating 0 0 0 0 -  Moving slowly or fidgety/restless 1 0 0 0 -  Suicidal thoughts 0 0 - - -  PHQ-9 Score 3 2 1 1  -  Difficult doing work/chores Somewhat difficult - Not difficult at all Not difficult at all -   Interpretation of Total Score  Total Score Depression Severity:  1-4 = Minimal depression, 5-9 = Mild depression, 10-14 = Moderate depression, 15-19 = Moderately severe depression, 20-27 = Severe depression   Psychosocial Evaluation and Intervention:  Psychosocial Evaluation - 09/17/23 1131       Psychosocial Evaluation & Interventions   Interventions Stress management education;Encouraged to exercise with the program and follow exercise prescription    Comments Pachia recently had lung surgery. She is dealing with the stress of not being able to breath like she used to. She hopes exercise will help her stress. Staff will educate Amdrea on ways tp reduce her stress.    Expected Outcomes For Zaidyn to have less stress during PR    Continue Psychosocial Services  No Follow up required             Psychosocial Re-Evaluation:  Psychosocial  Re-Evaluation     Row Name 07/08/23 0925 07/30/23 3086           Psychosocial Re-Evaluation   Current issues with None Identified None Identified      Comments No changes since orientation. Oreatha is scheduled to start the program next week. Tineka graduated on 07/29/23 from the PR program. No new psychosocial barriers or concerns at the time of graduation.      Expected Outcomes For Caydance to participate in PR before her lung surgery, for pt to not have any barriers or concerns For Gabriela to apply what she learned in PR class      Interventions Encouraged to attend Pulmonary Rehabilitation for the exercise --      Continue Psychosocial Services  No Follow up required --  Psychosocial Discharge (Final Psychosocial Re-Evaluation):  Psychosocial Re-Evaluation - 07/30/23 0812       Psychosocial Re-Evaluation   Current issues with None Identified    Comments Jessical graduated on 07/29/23 from the PR program. No new psychosocial barriers or concerns at the time of graduation.    Expected Outcomes For Kylie to apply what she learned in PR class             Education: Education Goals: Education classes will be provided on a weekly basis, covering required topics. Participant will state understanding/return demonstration of topics presented.  Learning Barriers/Preferences:  Learning Barriers/Preferences - 09/17/23 1108       Learning Barriers/Preferences   Learning Barriers Hearing;Reading    Learning Preferences Written Material;Skilled Demonstration             Education Topics: Know Your Numbers Group instruction that is supported by a PowerPoint presentation. Instructor discusses importance of knowing and understanding resting, exercise, and post-exercise oxygen saturation, heart rate, and blood pressure. Oxygen saturation, heart rate, blood pressure, rating of perceived exertion, and dyspnea are reviewed along with a normal range for these values.     Exercise for the Pulmonary Patient Group instruction that is supported by a PowerPoint presentation. Instructor discusses benefits of exercise, core components of exercise, frequency, duration, and intensity of an exercise routine, importance of utilizing pulse oximetry during exercise, safety while exercising, and options of places to exercise outside of rehab.    MET Level  Group instruction provided by PowerPoint, verbal discussion, and written material to support subject matter. Instructor reviews what METs are and how to increase METs.    Pulmonary Medications Verbally interactive group education provided by instructor with focus on inhaled medications and proper administration.   Anatomy and Physiology of the Respiratory System Group instruction provided by PowerPoint, verbal discussion, and written material to support subject matter. Instructor reviews respiratory cycle and anatomical components of the respiratory system and their functions. Instructor also reviews differences in obstructive and restrictive respiratory diseases with examples of each.  Flowsheet Row PULMONARY REHAB OTHER RESPIRATORY from 07/24/2023 in [facility]  Date 07/24/23  Educator RT  Instruction Review Code 1- Verbalizes Understanding       Oxygen Safety Group instruction provided by PowerPoint, verbal discussion, and written material to support subject matter. There is an overview of "What is Oxygen" and "Why do we need it".  Instructor also reviews how to create a safe environment for oxygen use, the importance of using oxygen as prescribed, and the risks of noncompliance. There is a brief discussion on traveling with oxygen and resources the patient may utilize.   Oxygen Use Group instruction provided by PowerPoint, verbal discussion, and written material to discuss how supplemental oxygen is prescribed and different types of oxygen supply  systems. Resources for more information are provided.    Breathing Techniques Group instruction that is supported by demonstration and informational handouts. Instructor discusses the benefits of pursed lip and diaphragmatic breathing and detailed demonstration on how to perform both.     Risk Factor Reduction Group instruction that is supported by a PowerPoint presentation. Instructor discusses the definition of a risk factor, different risk factors for pulmonary disease, and how the heart and lungs work together.   Pulmonary Diseases Group instruction provided by PowerPoint, verbal discussion, and written material to support subject matter. Instructor gives an overview of the different type of pulmonary diseases. There is also a discussion  on risk factors and symptoms as well as ways to manage the diseases. Flowsheet Row PULMONARY REHAB OTHER RESPIRATORY from 07/17/2023 in [facility]  Date 07/17/23  Educator RT  Instruction Review Code 1- Verbalizes Understanding       Stress and Energy Conservation Group instruction provided by PowerPoint, verbal discussion, and written material to support subject matter. Instructor gives an overview of stress and the impact it can have on the body. Instructor also reviews ways to reduce stress. There is also a discussion on energy conservation and ways to conserve energy throughout the day.   Warning Signs and Symptoms Group instruction provided by PowerPoint, verbal discussion, and written material to support subject matter. Instructor reviews warning signs and symptoms of stroke, heart attack, cold and flu. Instructor also reviews ways to prevent the spread of infection.   Other Education Group or individual verbal, written, or video instructions that support the educational goals of the pulmonary rehab program.    Knowledge Questionnaire Score:  Knowledge Questionnaire Score -  09/17/23 1142       Knowledge Questionnaire Score   Pre Score 16/18             Core Components/Risk Factors/Patient Goals at Admission:  Personal Goals and Risk Factors at Admission - 09/17/23 1108       Core Components/Risk Factors/Patient Goals on Admission    Weight Management Yes;Weight Maintenance    Intervention Weight Management: Develop a combined nutrition and exercise program designed to reach desired caloric intake, while maintaining appropriate intake of nutrient and fiber, sodium and fats, and appropriate energy expenditure required for the weight goal.;Weight Management: Provide education and appropriate resources to help participant work on and attain dietary goals.;Weight Management/Obesity: Establish reasonable short term and long term weight goals.;Obesity: Provide education and appropriate resources to help participant work on and attain dietary goals.    Admit Weight 137 lb 2 oz (62.2 kg)    Expected Outcomes Short Term: Continue to assess and modify interventions until short term weight is achieved;Long Term: Adherence to nutrition and physical activity/exercise program aimed toward attainment of established weight goal;Weight Maintenance: Understanding of the daily nutrition guidelines, which includes 25-35% calories from fat, 7% or less cal from saturated fats, less than 200mg  cholesterol, less than 1.5gm of sodium, & 5 or more servings of fruits and vegetables daily;Understanding recommendations for meals to include 15-35% energy as protein, 25-35% energy from fat, 35-60% energy from carbohydrates, less than 200mg  of dietary cholesterol, 20-35 gm of total fiber daily;Understanding of distribution of calorie intake throughout the day with the consumption of 4-5 meals/snacks    Improve shortness of breath with ADL's Yes    Intervention Provide education, individualized exercise plan and daily activity instruction to help decrease symptoms of SOB with activities of daily  living.    Expected Outcomes Short Term: Improve cardiorespiratory fitness to achieve a reduction of symptoms when performing ADLs;Long Term: Be able to perform more ADLs without symptoms or delay the onset of symptoms             Core Components/Risk Factors/Patient Goals Review:   Goals and Risk Factor Review     Row Name 07/08/23 0932 07/30/23 0818           Core Components/Risk Factors/Patient Goals Review   Personal Goals Review Improve shortness of breath with ADL's;Develop more efficient breathing techniques such as purse lipped breathing and diaphragmatic breathing and practicing self-pacing with activity. --  Review Unable to assess, Narjis is scheduled to start the program today Concettina graduated from the PR program on 07/29/23. She only attended 6 sessions due to upcoming surgery. Goal not met for improving shortness of breath with ADL's. Goal met for developing more efficient breathing techniques such as purse lipped breathing and diaphragmatic breathing; and practicing self-pacing with activity. Deoborah was able to demonstrate purse lip breathing when exercising. She was also able to pace herself when she would get SOB.      Expected Outcomes For Kimberlin to improve her shortness of breath with ADLs and develop more efficient breathing techniques such as purse lipped breathing and diaphragmatic breathing; and practicing self-pacing with activity Patient will continue to benefit from ongoing nutrition, exercise, and lifestyle modification               Core Components/Risk Factors/Patient Goals at Discharge (Final Review):   Goals and Risk Factor Review - 07/30/23 0818       Core Components/Risk Factors/Patient Goals Review   Review Enijah graduated from the PR program on 07/29/23. She only attended 6 sessions due to upcoming surgery. Goal not met for improving shortness of breath with ADL's. Goal met for developing more efficient breathing techniques such as purse  lipped breathing and diaphragmatic breathing; and practicing self-pacing with activity. Deoborah was able to demonstrate purse lip breathing when exercising. She was also able to pace herself when she would get SOB.    Expected Outcomes Patient will continue to benefit from ongoing nutrition, exercise, and lifestyle modification             ITP Comments:   Comments: Dr. Genetta Kenning is Medical Director for Pulmonary Rehab at [facility].

## 2023-09-17 NOTE — Progress Notes (Signed)
 Joy Williams 74 y.o. female  Initial Psychosocial Assessment  Pt psychosocial assessment reveals pt lives alone. Pt is currently retired. Pt hobbies include gardening. Pt reports her stress level is moderate. Areas of stress/anxiety include health.  Pt does not exhibit signs of depression. Pt shows good  coping skills with positive outlook . Offered emotional support and reassurance. Monitor and evaluate progress toward psychosocial goal(s).  Goal(s): Improved management of stress Improved coping skills Help patient work toward returning to meaningful activities that improve patient's QOL and are attainable with patient's lung disease   09/17/2023 11:39 AM

## 2023-09-17 NOTE — Progress Notes (Signed)
 Joy Williams 73 y.o. female Pulmonary Rehab Orientation Note This patient who was referred to Pulmonary Rehab by Dr. Arbie Knock with the diagnosis of COPD arrived today in Cardiac and Pulmonary Rehab. She  arrived ambulatory with normal gait. She  does not carry portable oxygen. Per patient, Joy Williams uses oxygen never. Color good, skin warm and dry. Patient is oriented to time and place. Patient's medical history, psychosocial health, and medications reviewed. Psychosocial assessment reveals patient lives with alone. November is currently retired. Patient hobbies include gardening. Patient reports her stress level is moderate. Areas of stress/anxiety include health. Patient does not exhibit signs of depression.  PHQ2/9 score 0/3. Joy Williams shows good  coping skills with positive outlook on life. Offered emotional support and reassurance. Will continue to monitor. Physical assessment performed by Nurse pick: Willard Harman RN. Please see their orientation physical assessment note. Joy Williams reports she does take medications as prescribed. Patient states she follows a regular  diet. The patient reports no specific efforts to gain or lose weight.. Patient's weight will be monitored closely. Demonstration and practice of PLB using pulse oximeter. Joy Williams able to return demonstration satisfactorily. Safety and hand hygiene in the exercise area reviewed with patient. Joy Williams voices understanding of the information reviewed. Department expectations discussed with patient and achievable goals were set. The patient shows enthusiasm about attending the program and we look forward to working with Joy Williams. Joy Williams completed a 6 min walk test today and is scheduled to begin exercise on 09/23/23 @1015 .   [id number] Joy Williams, BSRT

## 2023-09-18 ENCOUNTER — Encounter (HOSPITAL_COMMUNITY): Payer: Medicare Other

## 2023-09-18 DIAGNOSIS — C3482 Malignant neoplasm of overlapping sites of left bronchus and lung: Secondary | ICD-10-CM | POA: Diagnosis not present

## 2023-09-19 ENCOUNTER — Other Ambulatory Visit: Payer: Self-pay | Admitting: *Deleted

## 2023-09-19 DIAGNOSIS — E039 Hypothyroidism, unspecified: Secondary | ICD-10-CM

## 2023-09-22 ENCOUNTER — Other Ambulatory Visit

## 2023-09-22 DIAGNOSIS — E039 Hypothyroidism, unspecified: Secondary | ICD-10-CM

## 2023-09-23 ENCOUNTER — Other Ambulatory Visit

## 2023-09-23 ENCOUNTER — Encounter (HOSPITAL_COMMUNITY)
Admission: RE | Admit: 2023-09-23 | Discharge: 2023-09-23 | Disposition: A | Discharge status: Home / Self Care | Source: Ambulatory Visit | Attending: Pulmonary Disease | Admitting: Pulmonary Disease

## 2023-09-23 ENCOUNTER — Encounter (HOSPITAL_COMMUNITY): Payer: Medicare Other

## 2023-09-23 DIAGNOSIS — J449 Chronic obstructive pulmonary disease, unspecified: Secondary | ICD-10-CM | POA: Diagnosis not present

## 2023-09-23 NOTE — Progress Notes (Signed)
 Daily Session Note  Patient Details  Name: Joy Williams MRN: [id number] Date of Birth: 1951/03/26 Referring Provider:   Gattis Kass Pulmonary Rehab Walk Test from 09/17/2023 in [facility], [facility]  Referring Provider [place]       Encounter Date: 09/23/2023  Check In:  Session Check In - 09/23/23 1153       Check-In   Supervising physician immediately available to respond to emergencies [facility] MD immediately available    Physician(s) Theresia Flasher, NP    Location [facility]    Staff Present Willard Harman, RN, BSN;Chequita Mofield Felipe Horton, Emilio Harder, MS, ACSM-CEP, Exercise Physiologist;Samantha Belarus, RD, LDN;Randi Regis Captain BS, ACSM-CEP, Exercise Physiologist;Johnny Alexia Angelucci, MS, Exercise Physiologist    Virtual Visit No    Medication changes reported     No    Fall or balance concerns reported    No    Tobacco Cessation No Change    Current number of cigarettes/nicotine  per day     0    Warm-up and Cool-down Performed as group-led instruction    Resistance Training Performed Yes    VAD Patient? No    PAD/SET Patient? No      Pain Assessment   Currently in Pain? No/denies    Multiple Pain Sites No             Capillary Blood Glucose: No results found for this or any previous visit (from the past 24 hours).    Social History   Tobacco Use  Smoking Status Former   Current packs/day: 0.00   Average packs/day: 1 pack/day for 40.1 years (40.1 ttl pk-yrs)   Types: Cigarettes   Start date: 05/21/1983   Quit date: 06/12/2023   Years since quitting: 0.2   Passive exposure: Never  Smokeless Tobacco Never  Tobacco Comments   Quit 06/12/23    Goals Met:  Proper associated with RPD/PD & O2 Sat Independence with exercise equipment Exercise tolerated well No report of concerns or symptoms today Strength training completed today  Goals Unmet:  Not Applicable  Comments: Service time is from 1021 to  1135.    Dr. Genetta Kenning is Medical Director for Pulmonary Rehab at [facility].

## 2023-09-25 ENCOUNTER — Encounter (HOSPITAL_COMMUNITY): Payer: Medicare Other

## 2023-09-25 ENCOUNTER — Encounter: Payer: Self-pay | Admitting: Family Medicine

## 2023-09-25 ENCOUNTER — Encounter (HOSPITAL_COMMUNITY)
Admission: RE | Admit: 2023-09-25 | Discharge: 2023-09-25 | Disposition: A | Discharge status: Home / Self Care | Source: Ambulatory Visit | Attending: Pulmonary Disease | Admitting: Pulmonary Disease

## 2023-09-25 DIAGNOSIS — J449 Chronic obstructive pulmonary disease, unspecified: Secondary | ICD-10-CM

## 2023-09-25 NOTE — Progress Notes (Signed)
 Daily Session Note  Patient Details  Name: Joy Williams MRN: [id number] Date of Birth: Jun 12, 1950 Referring Provider:   Gattis Kass Pulmonary Rehab Walk Test from 09/17/2023 in [facility] for Heart, Vascular, & Lung Health  Referring Provider [place]       Encounter Date: 09/25/2023  Check In:  Session Check In - 09/25/23 1030       Check-In   Supervising physician immediately available to respond to emergencies [facility] MD immediately available    Physician(s) Levin Reamer, NP    Location [facility]    Staff Present Willard Harman, RN, BSN;Estes Lehner, Emilio Harder, MS, ACSM-CEP, Exercise Physiologist;Samantha Belarus, RD, LDN;Randi Regis Captain BS, ACSM-CEP, Exercise Physiologist;David [place], MS, ACSM-CEP, CCRP, Exercise Physiologist    Virtual Visit No    Medication changes reported     No    Fall or balance concerns reported    No    Tobacco Cessation No Change    Warm-up and Cool-down Performed as group-led instruction    Resistance Training Performed Yes    VAD Patient? No    PAD/SET Patient? No      Pain Assessment   Currently in Pain? Yes    Pain Score 3     Pain Orientation Left    Pain Type Surgical pain;Neuropathic pain    Pain Onset 1 to 4 weeks ago             Capillary Blood Glucose: No results found for this or any previous visit (from the past 24 hours).    Social History   Tobacco Use  Smoking Status Former   Current packs/day: 0.00   Average packs/day: 1 pack/day for 40.1 years (40.1 ttl pk-yrs)   Types: Cigarettes   Start date: 05/21/1983   Quit date: 06/12/2023   Years since quitting: 0.2   Passive exposure: Never  Smokeless Tobacco Never  Tobacco Comments   Quit 06/12/23    Goals Met:  Proper associated with RPD/PD & O2 Sat Independence with exercise equipment Exercise tolerated well No report of concerns or symptoms today Strength training completed today  Goals Unmet:  Not  Applicable  Comments: Service time is from 1020 to 1138.    Dr. Genetta Kenning is Medical Director for Pulmonary Rehab at [facility].

## 2023-09-30 ENCOUNTER — Encounter (HOSPITAL_COMMUNITY)
Admission: RE | Admit: 2023-09-30 | Discharge: 2023-09-30 | Disposition: A | Discharge status: Home / Self Care | Source: Ambulatory Visit | Attending: Pulmonary Disease | Admitting: Pulmonary Disease

## 2023-09-30 VITALS — Wt 141.1 lb

## 2023-09-30 DIAGNOSIS — J449 Chronic obstructive pulmonary disease, unspecified: Secondary | ICD-10-CM

## 2023-10-01 NOTE — Progress Notes (Signed)
 Pulmonary Individual Treatment Plan  Patient Details  Name: Joy Williams MRN: [id number] Date of Birth: [birth year] Referring Provider:   Gattis Kass Pulmonary Rehab Walk Test from 09/17/2023 in [facility]  Referring Provider D'[place]       Initial Encounter Date:  Flowsheet Row Pulmonary Rehab Walk Test from 09/17/2023 in [facility]  Date 09/17/23       Visit Diagnosis: Chronic obstructive pulmonary disease, unspecified COPD type (HCC)  Patient's Home Medications on Admission:   Current Outpatient Medications:    acetaminophen  (TYLENOL ) 500 MG tablet, Take 1,000 mg by mouth every 6 (six) hours as needed for mild pain (pain score 1-3) or headache., Disp: , Rfl:    Cholecalciferol (VITAMIN D ) 125 MCG (5000 UT) CAPS, Take 5,000 Units by mouth daily., Disp: , Rfl:    clopidogrel  (PLAVIX ) 75 MG tablet, Take 1 tablet (75 mg total) by mouth daily., Disp: 90 tablet, Rfl: 2   EPINEPHrine  0.3 mg/0.3 mL IJ SOAJ injection, Inject 0.3 mg into the muscle as needed., Disp: 1 each, Rfl: 1   ferrous sulfate  325 (65 FE) MG tablet, Take 1 tablet (325 mg total) by mouth daily. (Patient taking differently: Take 325 mg by mouth daily. OTC), Disp: 90 tablet, Rfl: 3   fluticasone  (FLONASE ) 50 MCG/ACT nasal spray, Place 1 spray into both nostrils daily., Disp: , Rfl:    Fluticasone -Umeclidin-Vilant (TRELEGY ELLIPTA ) 100-62.5-25 MCG/ACT AEPB, INHALE 1 PUFF BY MOUTH EVERY DAY, Disp: 60 each, Rfl: 5   Fluticasone -Umeclidin-Vilant (TRELEGY ELLIPTA ) 100-62.5-25 MCG/ACT AEPB, Inhale 1 puff into the lungs daily., Disp: , Rfl:    gabapentin  (NEURONTIN ) 300 MG capsule, Take 1 capsule (300 mg total) by mouth 2 (two) times daily., Disp: 60 capsule, Rfl: 2   hydrALAZINE  (APRESOLINE ) 25 MG tablet, Take 1 tablet (25 mg total) by mouth daily as needed (for systolic BP>150)., Disp: 30 tablet, Rfl: 9    ibuprofen (ADVIL) 200 MG tablet, Take 400 mg by mouth every 6 (six) hours as needed for moderate pain (pain score 4-6)., Disp: , Rfl:    lactulose  (CHRONULAC ) 10 GM/15ML solution, Take 45 mLs (30 g total) by mouth 2 (two) times daily as needed for moderate constipation or severe constipation., Disp: 946 mL, Rfl: 0   levothyroxine  (SYNTHROID ) 150 MCG tablet, Take 1 tablet (150 mcg total) by mouth daily., Disp: 90 tablet, Rfl: 3   lidocaine  (LIDODERM ) 5 %, Place 1 patch onto the skin daily. Remove & Discard patch within 12 hours or as directed by MD, Disp: 30 patch, Rfl: 0   MELATONIN PO, Take 1 tablet by mouth at bedtime as needed (sleep)., Disp: , Rfl:    methocarbamol  (ROBAXIN ) 500 MG tablet, Take 1 tablet (500 mg total) by mouth every 6 (six) hours as needed for muscle spasms., Disp: 30 tablet, Rfl: 2   morphine  (MSIR) 15 MG tablet, Take 0.5-1 tablets (7.5-15 mg total) by mouth every 6 (six) hours as needed (breakthrough pain)., Disp: 30 tablet, Rfl: 0   Multiple Vitamins-Minerals (MULTIVITAMIN WITH MINERALS) tablet, Take 1 tablet by mouth daily., Disp: , Rfl:    nitroGLYCERIN  (NITROSTAT ) 0.4 MG SL tablet, Place 1 tablet (0.4 mg total) under the tongue every 5 (five) minutes as needed., Disp: 10 tablet, Rfl: 1   rosuvastatin  (CRESTOR ) 20 MG tablet, Take 1 tablet (20 mg total) by mouth every evening., Disp: 90 tablet, Rfl: 3   sennosides-docusate sodium  (  SENOKOT-S) 8.6-50 MG tablet, Take 1 tablet by mouth daily., Disp: , Rfl:    Turmeric 500 MG CAPS, Take 500 mg by mouth daily., Disp: , Rfl:    vitamin B-12 (CYANOCOBALAMIN ) 1000 MCG tablet, Take 1 tablet (1,000 mcg total) by mouth daily., Disp: , Rfl:    Zinc 30 MG TABS, Take 30 mg by mouth., Disp: , Rfl:   Past Medical History: Past Medical History:  Diagnosis Date   Allergy    Asthma    pt denies this, but is on Dulera    Carotid artery disease (HCC) 2019   1-39% stenosis by Dopplers    Chronic diastolic congestive heart failure (HCC)     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   Complication of anesthesia    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   31 mm Edwards Magna mitral bovine bioprosthetic tissue valve placed via right mini thoracotomy approach   Severe mitral regurgitation 11/15/2014   Shortness of breath dyspnea    with exertion   STD (sexually transmitted disease)    STEMI (ST elevation myocardial infarction) (HCC) 10/24/2020   Telangiectasia     Tobacco Use: Social History   Tobacco Use  Smoking Status Former   Current packs/day: 0.00   Average packs/day: 1 pack/day for 40.1 years (40.1 ttl pk-yrs)   Types: Cigarettes   Start date: 05/21/1983   Quit date: 06/12/2023   Years since quitting: 0.3   Passive exposure: Never  Smokeless Tobacco Never  Tobacco Comments   Quit 06/12/23    Labs: Review Flowsheet  More data exists      Latest Ref Rng & Units 08/07/2020 10/24/2020 05/30/2022 08/28/2022 08/17/2023  Labs for ITP Cardiac and Pulmonary Rehab  Cholestrol 100 - 199 mg/dL 147  829  562  130  -  LDL (calc) 0 - 99 mg/dL 50  55  54  55  -  HDL-C >39 mg/dL 46  60  66  67  -  Trlycerides 0 - 149 mg/dL 865  784  98  696  -  Hemoglobin A1c 4.8 - 5.6 % 5.6  5.5  - - -  TCO2 22 - 32 mmol/L  - 24  - - 29     Capillary Blood Glucose: Lab Results  Component Value Date   GLUCAP 92 01/07/2023   GLUCAP 94 11/03/2020   GLUCAP 85 11/03/2020   GLUCAP 81 03/11/2018   GLUCAP 86 07/03/2016     Pulmonary Assessment Scores:  Pulmonary Assessment Scores     Row Name 07/07/23 1021 07/24/23 1537 07/29/23 1531     ADL UCSD   ADL Phase Entry Exit Exit   SOB Score total 6 7 --     CAT Score  CAT Score 11 12 --     mMRC Score   mMRC Score 2 -- 2    Row Name 09/17/23 1127         ADL UCSD   ADL Phase Entry     SOB Score total 31       CAT Score   CAT Score 7       mMRC Score   mMRC Score 2             UCSD: Self-administered rating of dyspnea associated with activities of daily living (ADLs) 6-point scale (0 = "not at all" to 5 = "maximal or unable to do because of breathlessness")  Scoring Scores range from 0 to 120.  Minimally important difference is 5 units  CAT: CAT can identify the health impairment of COPD patients and is better correlated with disease progression.  CAT has a scoring range of zero to 40. The CAT score is classified into four groups of low (less than 10), medium (10 - 20), high (21-30) and very high (31-40) based on the impact level of disease on health status. A CAT score over 10 suggests significant symptoms.  A worsening CAT score could be explained by an exacerbation, poor medication adherence, poor inhaler technique, or progression of COPD or comorbid conditions.  CAT MCID is 2 points  mMRC: mMRC (Modified Medical Research Council) Dyspnea Scale is used to assess the degree of baseline functional disability in patients of respiratory disease due to dyspnea. No minimal important difference is established. A decrease in score of 1 point or greater is considered a positive change.   Pulmonary Function Assessment:  Pulmonary Function Assessment - 09/17/23 1109       Breath   Bilateral Breath Sounds Rales    Shortness of Breath  Yes;Limiting activity;Fear of Shortness of Breath             Exercise Target Goals: Exercise Program Goal: Individual exercise prescription set using results from initial 6 min walk test and THRR while considering  patient's activity barriers and safety.   Exercise Prescription Goal: Initial exercise prescription builds to 30-45 minutes a day of aerobic activity, 2-3 days per week.  Home exercise guidelines will be given to patient during program as part of exercise prescription that the participant will acknowledge.  Activity Barriers & Risk Stratification:  Activity Barriers & Cardiac Risk Stratification - 09/17/23 1135       Activity Barriers & Cardiac Risk Stratification   Activity Barriers Deconditioning;Muscular Weakness;Shortness of Breath;Incisional Pain    Cardiac Risk Stratification Moderate             6 Minute Walk:  6 Minute Walk     Row Name 07/07/23 1109 07/29/23 1518 09/17/23 1052     6 Minute Walk   Phase Initial Initial Initial   Distance 1300 feet 1460 feet 790 feet   Distance % Change -- 12.6 % --   Distance Feet Change -- 160 ft --   Walk Time 6 minutes 6 minutes 6 minutes   # of Rest Breaks 0 0 2  1:50-2:00, 4:10-4:34   MPH 2.46 2.77 0.6   METS 3.1 3.17 1.95   RPE 9 11 13    Perceived Dyspnea  0 1 1   VO2 Peak 10.85 11.09 6.84   Symptoms No No No   Resting HR 67 bpm 64 bpm 71 bpm   Resting BP 146/82 120/60 126/82   Resting Oxygen Saturation  100 % 91 %  100 %   Exercise Oxygen Saturation  during 6 min walk 95 % 91 % 95 %   Max Ex. HR 84 bpm 93 bpm 95 bpm   Max Ex. BP 160/80 116/74 134/70   2 Minute Post BP 170/80 114/70 120/70     Interval HR   1 Minute HR 71 74 82   2 Minute HR 78 85 86   3 Minute HR 80 86 90   4 Minute HR 79 85 95   5 Minute HR 82 92 95   6 Minute HR 84 93 93   2 Minute Post HR 68 77 85   Interval Heart Rate? Yes -- Yes     Interval Oxygen   Interval Oxygen? Yes -- Yes   Baseline Oxygen Saturation % 100 % 95 %  100 %   1 Minute Oxygen Saturation % 97 % 91 % 96 %   1 Minute Liters of Oxygen 0 L 0 L 0 L   2 Minute Oxygen Saturation % 95 % 93 % 98 %   2 Minute Liters of Oxygen 0 L 0 L 0 L   3 Minute Oxygen Saturation % 98 % 92 % 97 %   3 Minute Liters of Oxygen 0 L 0 L 0 L   4 Minute Oxygen Saturation % 98 % 96 % 95 %   4 Minute Liters of Oxygen 0 L 0 L 0 L   5 Minute Oxygen Saturation % 99 % 94 % 96 %   5 Minute Liters of Oxygen 0 L 0 L 0 L   6 Minute Oxygen Saturation % 98 % 96 % 94 %   6 Minute Liters of Oxygen 0 L 0 L 0 L   2 Minute Post Oxygen Saturation % 99 % 97 % 100 %   2 Minute Post Liters of Oxygen 0 L 0 L 0 L            Oxygen Initial Assessment:  Oxygen Initial Assessment - 09/17/23 1109       Home Oxygen   Home Oxygen Device None    Sleep Oxygen Prescription None    Home Exercise Oxygen Prescription None    Home Resting Oxygen Prescription None      Initial 6 min Walk   Oxygen Used None      Program Oxygen Prescription   Program Oxygen Prescription None      Intervention   Short Term Goals To learn and understand importance of maintaining oxygen saturations>88%;To learn and demonstrate proper use of respiratory medications;To learn and understand importance of monitoring SPO2 with pulse oximeter and demonstrate accurate use of the pulse oximeter.;To learn and demonstrate proper pursed lip breathing techniques or other breathing techniques.     Long  Term Goals Maintenance of O2 saturations>88%;Compliance with respiratory medication;Verbalizes importance of monitoring SPO2 with pulse oximeter and return demonstration;Exhibits proper breathing techniques, such as pursed lip breathing or other method taught during program session;Demonstrates proper use of MDI's             Oxygen Re-Evaluation:  Oxygen Re-Evaluation     Row Name 09/26/23 0935             Program Oxygen Prescription   Program Oxygen Prescription None         Home Oxygen   Home Oxygen  Device None       Sleep Oxygen Prescription None       Home Exercise Oxygen Prescription None  Home Resting Oxygen Prescription None         Goals/Expected Outcomes   Short Term Goals To learn and understand importance of maintaining oxygen saturations>88%;To learn and demonstrate proper use of respiratory medications;To learn and understand importance of monitoring SPO2 with pulse oximeter and demonstrate accurate use of the pulse oximeter.;To learn and demonstrate proper pursed lip breathing techniques or other breathing techniques.        Long  Term Goals Maintenance of O2 saturations>88%;Compliance with respiratory medication;Verbalizes importance of monitoring SPO2 with pulse oximeter and return demonstration;Exhibits proper breathing techniques, such as pursed lip breathing or other method taught during program session;Demonstrates proper use of MDI's       Goals/Expected Outcomes Compliance and understanding of oxygen saturation monitoring and breathing techniques to decrease shortness of breath.                Oxygen Discharge (Final Oxygen Re-Evaluation):  Oxygen Re-Evaluation - 09/26/23 0935       Program Oxygen Prescription   Program Oxygen Prescription None      Home Oxygen   Home Oxygen Device None    Sleep Oxygen Prescription None    Home Exercise Oxygen Prescription None    Home Resting Oxygen Prescription None      Goals/Expected Outcomes   Short Term Goals To learn and understand importance of maintaining oxygen saturations>88%;To learn and demonstrate proper use of respiratory medications;To learn and understand importance of monitoring SPO2 with pulse oximeter and demonstrate accurate use of the pulse oximeter.;To learn and demonstrate proper pursed lip breathing techniques or other breathing techniques.     Long  Term Goals Maintenance of O2 saturations>88%;Compliance with respiratory medication;Verbalizes importance of monitoring SPO2 with pulse oximeter and  return demonstration;Exhibits proper breathing techniques, such as pursed lip breathing or other method taught during program session;Demonstrates proper use of MDI's    Goals/Expected Outcomes Compliance and understanding of oxygen saturation monitoring and breathing techniques to decrease shortness of breath.             Initial Exercise Prescription:  Initial Exercise Prescription - 09/17/23 1000       Date of Initial Exercise RX and Referring Provider   Date 09/17/23    Referring Provider D'amico    Expected Discharge Date 12/11/23      NuStep   Level 1    SPM 60    Minutes 15    METs 1.5      Track   Minutes 15    METs 1.95      Prescription Details   Frequency (times per week) 2    Duration Progress to 30 minutes of continuous aerobic without signs/symptoms of physical distress      Intensity   THRR 40-80% of Max Heartrate 59-132    Ratings of Perceived Exertion 11-13    Perceived Dyspnea 0-4      Progression   Progression Continue to progress workloads to maintain intensity without signs/symptoms of physical distress.      Resistance Training   Training Prescription Yes    Weight red bands    Reps 10-15             Perform Capillary Blood Glucose checks as needed.  Exercise Prescription Changes:   Exercise Prescription Changes     Row Name 07/22/23 1500 09/30/23 1100           Response to Exercise   Blood Pressure (Admit) 102/60 128/76      Blood Pressure (Exercise) 122/70  130/68      Blood Pressure (Exit) 92/56 112/62      Heart Rate (Admit) 65 bpm 65 bpm      Heart Rate (Exercise) 82 bpm 88 bpm      Heart Rate (Exit) 71 bpm 67 bpm      Oxygen Saturation (Admit) 98 % 100 %      Oxygen Saturation (Exercise) 96 % 93 %      Oxygen Saturation (Exit) 96 % 97 %      Rating of Perceived Exertion (Exercise) 11 11      Perceived Dyspnea (Exercise) 0.5 1      Duration Continue with 30 min of aerobic exercise without signs/symptoms of physical  distress. Continue with 30 min of aerobic exercise without signs/symptoms of physical distress.      Intensity THRR unchanged THRR unchanged        Progression   Progression Continue to progress workloads to maintain intensity without signs/symptoms of physical distress. Continue to progress workloads to maintain intensity without signs/symptoms of physical distress.        Resistance Training   Training Prescription Yes Yes      Weight blue bands red bands      Reps 10-15 10-15      Time 10 Minutes 10 Minutes        Treadmill   MPH 2.5 --      Grade 1.5 --      Minutes 215 --      METs 2.8 --        Bike   Level 2.5 --      Minutes 15 --      METs 3.3 --        NuStep   Level -- 1      SPM -- 77      Minutes -- 15      METs -- 1.9        Track   Laps -- 8      Minutes -- 15      METs -- 2.23               Exercise Comments:   Exercise Comments     Row Name 07/08/23 1540 09/23/23 1204         Exercise Comments Pt completed first day of exercise. She walked on the treadmill for 15 min, speed 1.7 mph, METs 2.2. She then exercised on the scifit bike for 15 min, level 1, METs 2.6. tolerated well. Performed warm up and cool down without restrictions. Discussed METs with good understanding. Pt completed first day of group exercise. She walked the track for 15 min with rest stops, METs 2.54. She then exercised on the recumbent stepper for 15 min, level 1, METs 1.7. Pt performed warm up and cool down with intermittent rests. She understands METs.               Exercise Goals and Review:   Exercise Goals     Row Name 07/07/23 1022 09/17/23 1056           Exercise Goals   Increase Physical Activity Yes Yes      Intervention Provide advice, education, support and counseling about physical activity/exercise needs.;Develop an individualized exercise prescription for aerobic and resistive training based on initial evaluation findings, risk stratification,  comorbidities and participant's personal goals. Provide advice, education, support and counseling about physical activity/exercise needs.;Develop an individualized exercise prescription for aerobic and resistive training based on  initial evaluation findings, risk stratification, comorbidities and participant's personal goals.      Expected Outcomes Short Term: Attend rehab on a regular basis to increase amount of physical activity.;Long Term: Add in home exercise to make exercise part of routine and to increase amount of physical activity.;Long Term: Exercising regularly at least 3-5 days a week. Short Term: Attend rehab on a regular basis to increase amount of physical activity.;Long Term: Add in home exercise to make exercise part of routine and to increase amount of physical activity.;Long Term: Exercising regularly at least 3-5 days a week.      Increase Strength and Stamina Yes Yes      Intervention Provide advice, education, support and counseling about physical activity/exercise needs.;Develop an individualized exercise prescription for aerobic and resistive training based on initial evaluation findings, risk stratification, comorbidities and participant's personal goals. Provide advice, education, support and counseling about physical activity/exercise needs.;Develop an individualized exercise prescription for aerobic and resistive training based on initial evaluation findings, risk stratification, comorbidities and participant's personal goals.      Expected Outcomes Short Term: Increase workloads from initial exercise prescription for resistance, speed, and METs.;Short Term: Perform resistance training exercises routinely during rehab and add in resistance training at home;Long Term: Improve cardiorespiratory fitness, muscular endurance and strength as measured by increased METs and functional capacity ( ) Short Term: Increase workloads from initial exercise prescription for resistance, speed, and  METs.;Short Term: Perform resistance training exercises routinely during rehab and add in resistance training at home;Long Term: Improve cardiorespiratory fitness, muscular endurance and strength as measured by increased METs and functional capacity ( )      Able to understand and use rate of perceived exertion (RPE) scale Yes Yes      Intervention Provide education and explanation on how to use RPE scale Provide education and explanation on how to use RPE scale      Expected Outcomes Short Term: Able to use RPE daily in rehab to express subjective intensity level;Long Term:  Able to use RPE to guide intensity level when exercising independently Short Term: Able to use RPE daily in rehab to express subjective intensity level;Long Term:  Able to use RPE to guide intensity level when exercising independently      Able to understand and use Dyspnea scale Yes Yes      Intervention Provide education and explanation on how to use Dyspnea scale Provide education and explanation on how to use Dyspnea scale      Expected Outcomes Short Term: Able to use Dyspnea scale daily in rehab to express subjective sense of shortness of breath during exertion;Long Term: Able to use Dyspnea scale to guide intensity level when exercising independently Short Term: Able to use Dyspnea scale daily in rehab to express subjective sense of shortness of breath during exertion;Long Term: Able to use Dyspnea scale to guide intensity level when exercising independently      Knowledge and understanding of Target Heart Rate Range (THRR) Yes Yes      Intervention Provide education and explanation of THRR including how the numbers were predicted and where they are located for reference Provide education and explanation of THRR including how the numbers were predicted and where they are located for reference      Expected Outcomes Short Term: Able to state/look up THRR;Long Term: Able to use THRR to govern intensity when exercising  independently;Short Term: Able to use daily as guideline for intensity in rehab Short Term: Able to state/look up THRR;Long Term: Able  to use THRR to govern intensity when exercising independently;Short Term: Able to use daily as guideline for intensity in rehab      Able to check pulse independently -- Yes      Intervention -- Provide education and demonstration on how to check pulse in carotid and radial arteries.;Review the importance of being able to check your own pulse for safety during independent exercise      Expected Outcomes -- Short Term: Able to explain why pulse checking is important during independent exercise;Long Term: Able to check pulse independently and accurately      Understanding of Exercise Prescription Yes Yes      Intervention Provide education, explanation, and written materials on patient's individual exercise prescription Provide education, explanation, and written materials on patient's individual exercise prescription      Expected Outcomes Short Term: Able to explain program exercise prescription;Long Term: Able to explain home exercise prescription to exercise independently Short Term: Able to explain program exercise prescription;Long Term: Able to explain home exercise prescription to exercise independently               Exercise Goals Re-Evaluation :  Exercise Goals Re-Evaluation     Row Name 07/08/23 0744 07/29/23 1532 09/26/23 0932         Exercise Goal Re-Evaluation   Exercise Goals Review Increase Physical Activity;Able to understand and use Dyspnea scale;Understanding of Exercise Prescription;Increase Strength and Stamina;Knowledge and understanding of Target Heart Rate Range (THRR);Able to understand and use rate of perceived exertion (RPE) scale Increase Physical Activity;Able to understand and use Dyspnea scale;Understanding of Exercise Prescription;Increase Strength and Stamina;Knowledge and understanding of Target Heart Rate Range (THRR);Able to  understand and use rate of perceived exertion (RPE) scale Increase Physical Activity;Able to understand and use Dyspnea scale;Understanding of Exercise Prescription;Increase Strength and Stamina;Knowledge and understanding of Target Heart Rate Range (THRR);Able to understand and use rate of perceived exertion (RPE) scale     Comments Pt to begin exercise 07/08/23. Will progress as tolerated. Pt completed 6 exercise sessions. She needed to graduate early due to the need for lung resection. She plans to get another referral for PR after she recovers. She exercised on the treadmill, speed 2.5, incline 1.5, METs 3.1. She then exercises on the scifit bike for 15 min level 2.5, METs 3.5. Tolerated well. Increased her 6 min walk test by 12.6 %, 160 ft. Her SOB, CAT, and MMRC scores did not change with the short duration. Encouraged pt to continue walking at home. Quatisha has completed 2 exercise sessions. Pt returned to PR after surgery. Remell exercises for 15 min on the track and Nustep. She averages 2.69 METs on the track and 2.0 METs at level 1 on the Nustep. She performs the warmup and cooldown standing/seated due to her post-surgery pain. Chinyere is very limited by her pain. She tries to exercise through the pain. Will continue to monitor and progress as able.     Expected Outcomes Through exercise at rehab and home, the patient will decrease shortness of breath with daily activities and feel confident in carrying out an exercise regimen at home Through exercise at rehab and home, the patient will decrease shortness of breath with daily activities and feel confident in carrying out an exercise regimen at home Through exercise at rehab and home, the patient will decrease shortness of breath with daily activities and feel confident in carrying out an exercise regimen at home  Discharge Exercise Prescription (Final Exercise Prescription Changes):  Exercise Prescription Changes - 09/30/23 1100        Response to Exercise   Blood Pressure (Admit) 128/76    Blood Pressure (Exercise) 130/68    Blood Pressure (Exit) 112/62    Heart Rate (Admit) 65 bpm    Heart Rate (Exercise) 88 bpm    Heart Rate (Exit) 67 bpm    Oxygen Saturation (Admit) 100 %    Oxygen Saturation (Exercise) 93 %    Oxygen Saturation (Exit) 97 %    Rating of Perceived Exertion (Exercise) 11    Perceived Dyspnea (Exercise) 1    Duration Continue with 30 min of aerobic exercise without signs/symptoms of physical distress.    Intensity THRR unchanged      Progression   Progression Continue to progress workloads to maintain intensity without signs/symptoms of physical distress.      Resistance Training   Training Prescription Yes    Weight red bands    Reps 10-15    Time 10 Minutes      NuStep   Level 1    SPM 77    Minutes 15    METs 1.9      Track   Laps 8    Minutes 15    METs 2.23             Nutrition:  Target Goals: Understanding of nutrition guidelines, daily intake of sodium 1500mg , cholesterol 200mg , calories 30% from fat and 7% or less from saturated fats, daily to have 5 or more servings of fruits and vegetables.  Biometrics:  Pre Biometrics - 09/17/23 1049       Pre Biometrics   Grip Strength 22 kg             Post Biometrics - 07/29/23 1531        Post  Biometrics   Grip Strength 24 kg             Nutrition Therapy Plan and Nutrition Goals:  Nutrition Therapy & Goals - 09/25/23 1115       Nutrition Therapy   Diet Heart Healthy diet    Drug/Food Interactions Statins/Certain Fruits      Personal Nutrition Goals   Nutrition Goal Patient to maintain weight while enrolled in pulmonary rehab.    Personal Goal #2 Patient to improve diet quality by using the plate method as a guide for meal planning to include lean protein/plant protein, fruits, vegetables, whole grains, nonfat dairy as part of a well-balanced diet.    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 She has previously participated in cardiac rehab in 2018. Lipids are at goal. Patient will benefit from participation in pulmonary rehab for nutrition, exercise, and lifestyle modification.      Intervention Plan   Intervention Prescribe, educate and counsel regarding individualized specific dietary modifications aiming towards targeted core components such as weight, hypertension, lipid management, diabetes, heart failure and other comorbidities.;Nutrition handout(s) given to patient.    Expected Outcomes Short Term Goal: Understand basic principles of dietary content, such as calories, fat, sodium, cholesterol and nutrients.;Long Term Goal: Adherence to prescribed nutrition plan.             Nutrition Assessments:  Nutrition Assessments - 07/24/23 1542       MEDFICTS Scores   Post Score 64   Pt didn't return pre-form  MEDIFICTS Score Key: >=70 Need to make dietary changes  40-70 Heart Healthy Diet <= 40 Therapeutic Level Cholesterol Diet  Flowsheet Row PULMONARY REHAB OTHER RESPIRATORY from 07/24/2023 in [facility]  Picture Your Plate Total Score on Discharge 64      Picture Your Plate Scores: <93 Unhealthy dietary pattern with much room for improvement. 41-50 Dietary pattern unlikely to meet recommendations for good health and room for improvement. 51-60 More healthful dietary pattern, with some room for improvement.  >60 Healthy dietary pattern, although there may be some specific behaviors that could be improved.    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 She has previously participated in cardiac rehab in 2018. Lipids are at goal. Patient will benefit from participation in pulmonary rehab for nutrition, exercise, and lifestyle modification.                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 She has previously participated in cardiac rehab in 2018. Lipids are at goal. Patient will benefit from participation in pulmonary rehab for nutrition, exercise, and lifestyle modification.             Psychosocial: Target Goals: Acknowledge presence or absence of significant depression and/or stress, maximize coping skills, provide positive support system. Participant is able to verbalize types and ability to use techniques and skills needed for reducing stress and depression.  Initial Review & Psychosocial Screening:  Initial Psych Review & Screening - 09/17/23 1107       Initial Review   Current issues with Current Stress Concerns    Source of Stress Concerns Chronic Illness;Unable to participate in former interests or hobbies    Comments Lillias recently had lung surgery. She is dealing with the stress of not being able to breath like she used to. She hopes exercise will help her stress.      Family Dynamics   Good Support System? Yes    Comments son and daughter      Barriers   Psychosocial barriers to participate in program The patient should benefit from training in stress management and relaxation.      Screening Interventions   Interventions Encouraged to exercise     Expected Outcomes Short Term goal: Utilizing psychosocial counselor, staff and physician to assist with identification of specific Stressors or current issues interfering with healing process. Setting desired goal for each stressor or current issue identified.;Long Term Goal: Stressors or current issues are controlled or eliminated.;Short Term goal: Identification and review with participant of any Quality of Life or Depression concerns found by scoring the questionnaire.;Long Term goal: The participant improves quality of Life and PHQ9 Scores as seen by  post scores and/or verbalization of changes             Quality of Life Scores:  Scores of 19 and below usually indicate a poorer quality of life in these areas.  A difference of  2-3 points is a clinically meaningful difference.  A difference of 2-3 points in the total score of the Quality of Life Index has been associated with significant improvement in overall quality of life, self-image, physical symptoms, and general health in studies assessing change in quality of life.  PHQ-9: Review Flowsheet  More data exists      09/17/2023 08/26/2023 07/24/2023 07/07/2023 09/04/2022  Depression screen PHQ 2/9  Decreased Interest 0 0 0 0 0  Down, Depressed, Hopeless 0 0 0 0 0  PHQ - 2 Score 0 0 0 0 0  Altered sleeping 0 1 0 0 -  Tired, decreased energy 2 1 1 1  -  Change in appetite 0 0 0 0 -  Feeling bad or failure about yourself  0 0 0 0 -  Trouble concentrating 0 0 0 0 -  Moving slowly or fidgety/restless 1 0 0 0 -  Suicidal thoughts 0 0 - - -  PHQ-9 Score 3 2 1 1  -  Difficult doing work/chores Somewhat difficult - Not difficult at all Not difficult at all -   Interpretation of Total Score  Total Score Depression Severity:  1-4 = Minimal depression, 5-9 = Mild depression, 10-14 = Moderate depression, 15-19 = Moderately severe depression, 20-27 = Severe depression   Psychosocial Evaluation and Intervention:  Psychosocial Evaluation - 09/17/23  1131       Psychosocial Evaluation & Interventions   Interventions Stress management education;Encouraged to exercise with the program and follow exercise prescription    Comments Loy recently had lung surgery. She is dealing with the stress of not being able to breath like she used to. She hopes exercise will help her stress. Staff will educate Parris on ways tp reduce her stress.    Expected Outcomes For Sreenika to have less stress during PR    Continue Psychosocial Services  No Follow up required             Psychosocial Re-Evaluation:  Psychosocial Re-Evaluation     Row Name 07/08/23 0925 07/30/23 9147 09/24/23 1344         Psychosocial Re-Evaluation   Current issues with None Identified None Identified Current Stress Concerns     Comments No changes since orientation. Carry is scheduled to start the program next week. Natividad graduated on 07/29/23 from the PR program. No new psychosocial barriers or concerns at the time of graduation. Stephania has attended one class so far. She is still dealing with pain from her surgery which causes her to feel stressed. No new stressors or concerns at this time.     Expected Outcomes For Stephannie to participate in PR before her lung surgery, for pt to not have any barriers or concerns For Brittinay to apply what she learned in PR class For Darnise to participate in PR with les pain and stress     Interventions Encouraged to attend Pulmonary Rehabilitation for the exercise -- --     Continue Psychosocial Services  No Follow up required -- No Follow up required              Psychosocial Discharge (Final Psychosocial Re-Evaluation):  Psychosocial Re-Evaluation - 09/24/23 1344       Psychosocial Re-Evaluation   Current issues with  Current Stress Concerns    Comments Darl has attended one class so far. She is still dealing with pain from her surgery which causes her to feel stressed. No new stressors or concerns at this time.     Expected Outcomes For Idora to participate in PR with les pain and stress    Continue Psychosocial Services  No Follow up required             Education: Education Goals: Education classes will be provided on a weekly basis, covering required topics. Participant will state understanding/return demonstration of topics presented.  Learning Barriers/Preferences:  Learning Barriers/Preferences - 09/17/23 1108       Learning Barriers/Preferences   Learning Barriers Hearing;Reading    Learning Preferences Written Material;Skilled Demonstration             Education Topics: Know Your Numbers Group instruction that is supported by a PowerPoint presentation. Instructor discusses importance of knowing and understanding resting, exercise, and post-exercise oxygen saturation, heart rate, and blood pressure. Oxygen saturation, heart rate, blood pressure, rating of perceived exertion, and dyspnea are reviewed along with a normal range for these values.    Exercise for the Pulmonary Patient Group instruction that is supported by a PowerPoint presentation. Instructor discusses benefits of exercise, core components of exercise, frequency, duration, and intensity of an exercise routine, importance of utilizing pulse oximetry during exercise, safety while exercising, and options of places to exercise outside of rehab.    MET Level  Group instruction provided by PowerPoint, verbal discussion, and written material to support subject matter. Instructor reviews what METs are and how to increase METs.    Pulmonary Medications Verbally interactive group education provided by instructor with focus on inhaled medications and proper administration.   Anatomy and Physiology of the Respiratory System Group instruction provided by PowerPoint, verbal discussion, and written material to support subject matter. Instructor reviews respiratory cycle and anatomical components of the respiratory system and  their functions. Instructor also reviews differences in obstructive and restrictive respiratory diseases with examples of each.  Flowsheet Row PULMONARY REHAB OTHER RESPIRATORY from 07/24/2023 in [facility] for Heart, Vascular, & Lung Health  Date 07/24/23  Educator RT  Instruction Review Code 1- Verbalizes Understanding       Oxygen Safety Group instruction provided by PowerPoint, verbal discussion, and written material to support subject matter. There is an overview of "What is Oxygen" and "Why do we need it".  Instructor also reviews how to create a safe environment for oxygen use, the importance of using oxygen as prescribed, and the risks of noncompliance. There is a brief discussion on traveling with oxygen and resources the patient may utilize.   Oxygen Use Group instruction provided by PowerPoint, verbal discussion, and written material to discuss how supplemental oxygen is prescribed and different types of oxygen supply systems. Resources for more information are provided.    Breathing Techniques Group instruction that is supported by demonstration and informational handouts. Instructor discusses the benefits of pursed lip and diaphragmatic breathing and detailed demonstration on how to perform both.     Risk Factor Reduction Group instruction that is supported by a PowerPoint presentation. Instructor discusses the definition of a risk factor, different risk factors for pulmonary disease, and how the heart and lungs work together.   Pulmonary Diseases Group instruction provided by PowerPoint, verbal discussion, and written material to support subject matter. Instructor gives an overview of the different type of pulmonary diseases. There is also a discussion  on risk factors and symptoms as well as ways to manage the diseases. Flowsheet Row PULMONARY REHAB OTHER RESPIRATORY from 07/17/2023 in [facility] for Heart, Vascular, & Lung  Health  Date 07/17/23  Educator RT  Instruction Review Code 1- Verbalizes Understanding       Stress and Energy Conservation Group instruction provided by PowerPoint, verbal discussion, and written material to support subject matter. Instructor gives an overview of stress and the impact it can have on the body. Instructor also reviews ways to reduce stress. There is also a discussion on energy conservation and ways to conserve energy throughout the day.   Warning Signs and Symptoms Group instruction provided by PowerPoint, verbal discussion, and written material to support subject matter. Instructor reviews warning signs and symptoms of stroke, heart attack, cold and flu. Instructor also reviews ways to prevent the spread of infection. Flowsheet Row PULMONARY REHAB OTHER RESPIRATORY from 09/25/2023 in [facility] for Heart, Vascular, & Lung Health  Date 09/25/23  Educator RN  Instruction Review Code 1- Verbalizes Understanding       Other Education Group or individual verbal, written, or video instructions that support the educational goals of the pulmonary rehab program.    Knowledge Questionnaire Score:  Knowledge Questionnaire Score - 09/17/23 1142       Knowledge Questionnaire Score   Pre Score 16/18             Core Components/Risk Factors/Patient Goals at Admission:  Personal Goals and Risk Factors at Admission - 09/17/23 1108       Core Components/Risk Factors/Patient Goals on Admission    Weight Management Yes;Weight Maintenance    Intervention Weight Management: Develop a combined nutrition and exercise program designed to reach desired caloric intake, while maintaining appropriate intake of nutrient and fiber, sodium and fats, and appropriate energy expenditure required for the weight goal.;Weight Management: Provide education and appropriate resources to help participant work on and attain dietary goals.;Weight Management/Obesity:  Establish reasonable short term and long term weight goals.;Obesity: Provide education and appropriate resources to help participant work on and attain dietary goals.    Admit Weight 137 lb 2 oz (62.2 kg)    Expected Outcomes Short Term: Continue to assess and modify interventions until short term weight is achieved;Long Term: Adherence to nutrition and physical activity/exercise program aimed toward attainment of established weight goal;Weight Maintenance: Understanding of the daily nutrition guidelines, which includes 25-35% calories from fat, 7% or less cal from saturated fats, less than 200mg  cholesterol, less than 1.5gm of sodium, & 5 or more servings of fruits and vegetables daily;Understanding recommendations for meals to include 15-35% energy as protein, 25-35% energy from fat, 35-60% energy from carbohydrates, less than 200mg  of dietary cholesterol, 20-35 gm of total fiber daily;Understanding of distribution of calorie intake throughout the day with the consumption of 4-5 meals/snacks    Improve shortness of breath with ADL's Yes    Intervention Provide education, individualized exercise plan and daily activity instruction to help decrease symptoms of SOB with activities of daily living.    Expected Outcomes Short Term: Improve cardiorespiratory fitness to achieve a reduction of symptoms when performing ADLs;Long Term: Be able to perform more ADLs without symptoms or delay the onset of symptoms             Core Components/Risk Factors/Patient Goals Review:   Goals and Risk Factor Review     Row Name 07/08/23 0932 07/30/23 0818 09/24/23 1347  Core Components/Risk Factors/Patient Goals Review   Personal Goals Review Improve shortness of breath with ADL's;Develop more efficient breathing techniques such as purse lipped breathing and diaphragmatic breathing and practicing self-pacing with activity. -- Weight Management/Obesity;Improve shortness of breath with ADL's;Develop more  efficient breathing techniques such as purse lipped breathing and diaphragmatic breathing and practicing self-pacing with activity.     Review Unable to assess, Melvis is scheduled to start the program today Kimana graduated from the PR program on 07/29/23. She only attended 6 sessions due to upcoming surgery. Goal not met for improving shortness of breath with ADL's. Goal met for developing more efficient breathing techniques such as purse lipped breathing and diaphragmatic breathing; and practicing self-pacing with activity. Deoborah was able to demonstrate purse lip breathing when exercising. She was also able to pace herself when she would get SOB. Monthly review of patient's Core Components/Risk Factors/Patient Goals are as follows: Goal progressing for improving shortness of breath with ADL's. Venora is currently exercising on RA to maintain sats >88%. She is currently exercising on the track and the Nustep. Goal progressing for developing more efficient breathing techniques such as purse lipped breathing and diaphragmatic breathing; and practicing self-pacing with activity. Goal progressing for maintaining weight. Luciel is working with staff dietitician on ways to maintain her weight while in the program.     Expected Outcomes For Melantha to improve her shortness of breath with ADLs and develop more efficient breathing techniques such as purse lipped breathing and diaphragmatic breathing; and practicing self-pacing with activity Patient will continue to benefit from ongoing nutrition, exercise, and lifestyle modification To improve shortness of breath with ADL's, develop more efficient breathing techniques such as purse lipped breathing and diaphragmatic breathing; and practicing self-pacing with activity and maintain her weight.              Core Components/Risk Factors/Patient Goals at Discharge (Final Review):   Goals and Risk Factor Review - 09/24/23 1347       Core Components/Risk  Factors/Patient Goals Review   Personal Goals Review Weight Management/Obesity;Improve shortness of breath with ADL's;Develop more efficient breathing techniques such as purse lipped breathing and diaphragmatic breathing and practicing self-pacing with activity.    Review Monthly review of patient's Core Components/Risk Factors/Patient Goals are as follows: Goal progressing for improving shortness of breath with ADL's. Ayaa is currently exercising on RA to maintain sats >88%. She is currently exercising on the track and the Nustep. Goal progressing for developing more efficient breathing techniques such as purse lipped breathing and diaphragmatic breathing; and practicing self-pacing with activity. Goal progressing for maintaining weight. Jemeka is working with staff dietitician on ways to maintain her weight while in the program.    Expected Outcomes To improve shortness of breath with ADL's, develop more efficient breathing techniques such as purse lipped breathing and diaphragmatic breathing; and practicing self-pacing with activity and maintain her weight.             ITP Comments:Pt is making expected progress toward Pulmonary Rehab goals after completing 3 session(s). Recommend continued exercise, life style modification, education, and utilization of breathing techniques to increase stamina and strength, while also decreasing shortness of breath with exertion.  Dr. Genetta Kenning is Medical Director for Pulmonary Rehab at [facility].     Comments:

## 2023-10-02 ENCOUNTER — Telehealth (HOSPITAL_COMMUNITY): Payer: Self-pay | Admitting: *Deleted

## 2023-10-02 ENCOUNTER — Encounter (HOSPITAL_COMMUNITY): Admission: RE | Admit: 2023-10-02 | Source: Ambulatory Visit

## 2023-10-07 ENCOUNTER — Telehealth (HOSPITAL_COMMUNITY): Payer: Self-pay | Admitting: *Deleted

## 2023-10-07 ENCOUNTER — Encounter (HOSPITAL_COMMUNITY)
Admission: RE | Admit: 2023-10-07 | Discharge: 2023-10-07 | Disposition: A | Discharge status: Home / Self Care | Source: Ambulatory Visit | Attending: Pulmonary Disease | Admitting: Pulmonary Disease

## 2023-10-07 DIAGNOSIS — J449 Chronic obstructive pulmonary disease, unspecified: Secondary | ICD-10-CM | POA: Diagnosis not present

## 2023-10-07 NOTE — Progress Notes (Signed)
 Daily Session Note  Patient Details  Name: Joy Williams MRN: [id number] Date of Birth: 1951/01/30 Referring Provider:   Gattis Kass Pulmonary Rehab Walk Test from 09/17/2023 in [facility] for Heart, Vascular, & Lung Health  Referring Provider [place]       Encounter Date: 10/07/2023  Check In:  Session Check In - 10/07/23 1139       Check-In   Supervising physician immediately available to respond to emergencies [facility] MD immediately available    Physician(s) Palmer Bobo, NP    Location [facility]    Staff Present Willard Harman, RN, BSN;Casey Felipe Horton, Emilio Harder, MS, ACSM-CEP, Exercise Physiologist;Samantha Belarus, RD, LDN;Naudia Crosley Regis Captain BS, ACSM-CEP, Exercise Physiologist    Virtual Visit No    Medication changes reported     No    Fall or balance concerns reported    No    Tobacco Cessation No Change    Warm-up and Cool-down Performed as group-led instruction    Resistance Training Performed Yes    VAD Patient? No    PAD/SET Patient? No      Pain Assessment   Currently in Pain? No/denies             Capillary Blood Glucose: No results found for this or any previous visit (from the past 24 hours).    Social History   Tobacco Use  Smoking Status Former   Current packs/day: 0.00   Average packs/day: 1 pack/day for 40.1 years (40.1 ttl pk-yrs)   Types: Cigarettes   Start date: 05/21/1983   Quit date: 06/12/2023   Years since quitting: 0.3   Passive exposure: Never  Smokeless Tobacco Never  Tobacco Comments   Quit 06/12/23    Goals Met:  Independence with exercise equipment Exercise tolerated well No report of concerns or symptoms today Strength training completed today  Goals Unmet:  Not Applicable  Comments: Service time is from 1010 to 1130.    Dr. Genetta Kenning is Medical Director for Pulmonary Rehab at [facility].

## 2023-10-07 NOTE — Telephone Encounter (Signed)
 Called pt to inform that the elevator is nonfunctional today and she will need to take stairs if she chooses to come. She agreed, sts that will not be a problem.  German Koller BS, ACSM-CEP 10/07/2023 9:18 AM\

## 2023-10-09 ENCOUNTER — Other Ambulatory Visit: Payer: Self-pay | Admitting: Family Medicine

## 2023-10-09 ENCOUNTER — Encounter (HOSPITAL_COMMUNITY)
Admission: RE | Admit: 2023-10-09 | Discharge: 2023-10-09 | Disposition: A | Discharge status: Home / Self Care | Source: Ambulatory Visit | Attending: Pulmonary Disease | Admitting: Pulmonary Disease

## 2023-10-09 DIAGNOSIS — J449 Chronic obstructive pulmonary disease, unspecified: Secondary | ICD-10-CM | POA: Diagnosis not present

## 2023-10-09 MED ORDER — MORPHINE SULFATE 15 MG PO TABS: 7.5000 mg | ORAL_TABLET | Freq: Four times a day (QID) | ORAL | 0 refills | Status: DC | PRN

## 2023-10-09 NOTE — Progress Notes (Signed)
 Daily Session Note  Patient Details  Name: Joy Williams MRN: [id number] Date of Birth: Aug 11, 1950 Referring Provider:   Gattis Kass Pulmonary Rehab Walk Test from 09/17/2023 in [facility], [facility], [facility]  Referring Provider [place]       Encounter Date: 10/09/2023  Check In:  Session Check In - 10/09/23 1045       Check-In   Supervising physician immediately available to respond to emergencies [facility] MD immediately available    Physician(s) Charles Connor, NP    Location [facility]    Staff Present Willard Harman, RN, BSN;Nicolis Boody Felipe Horton, Emilio Harder, MS, ACSM-CEP, Exercise Physiologist;Samantha Belarus, RD, LDN;Randi [facility], ACSM-CEP, Exercise Physiologist    Virtual Visit No    Medication changes reported     No    Fall or balance concerns reported    No    Tobacco Cessation No Change    Warm-up and Cool-down Performed as group-led instruction    Resistance Training Performed Yes    VAD Patient? No    PAD/SET Patient? No      Pain Assessment   Currently in Pain? No/denies    Pain Score 0-No pain    Multiple Pain Sites No             Capillary Blood Glucose: No results found for this or any previous visit (from the past 24 hours).    Social History   Tobacco Use  Smoking Status Former   Current packs/day: 0.00   Average packs/day: 1 pack/day for 40.1 years (40.1 ttl pk-yrs)   Types: Cigarettes   Start date: 05/21/1983   Quit date: 06/12/2023   Years since quitting: 0.3   Passive exposure: Never  Smokeless Tobacco Never  Tobacco Comments   Quit 06/12/23    Goals Met:  Proper associated with RPD/PD & O2 Sat Independence with exercise equipment Exercise tolerated well No report of concerns or symptoms today Strength training completed today  Goals Unmet:  Not Applicable  Comments: Service time is from 1009 to 1140.    Dr. Genetta Kenning is Medical Director for Pulmonary Rehab at [facility].

## 2023-10-09 NOTE — Telephone Encounter (Signed)
 Copied from CRM [id number]. Topic: Clinical - Medication Refill >> Oct 09, 2023  9:01 AM Lynnie Saucier S wrote: Medication: morphine  (MSIR) 15 MG tablet  Has the patient contacted their pharmacy? No (Agent: If no, request that the patient contact the pharmacy for the refill. If patient does not wish to contact the pharmacy document the reason why and proceed with request.) (Agent: If yes, when and what did the pharmacy advise?)  This is the patient's preferred pharmacy:  [id number] [place], [place] - [address]  [address] Phone: [phone] Fax: [phone]  Is this the correct pharmacy for this prescription? Yes If no, delete pharmacy and type the correct one.   Has the prescription been filled recently? No  Is the patient out of the medication? No  Has the patient been seen for an appointment in the last year OR does the patient have an upcoming appointment? Yes  Can we respond through MyChart? No  Agent: Please be advised that Rx refills may take up to 3 business days. We ask that you follow-up with your pharmacy.

## 2023-10-12 ENCOUNTER — Other Ambulatory Visit: Payer: Self-pay | Admitting: Internal Medicine

## 2023-10-14 ENCOUNTER — Encounter (HOSPITAL_COMMUNITY)
Admission: RE | Admit: 2023-10-14 | Discharge: 2023-10-14 | Disposition: A | Discharge status: Home / Self Care | Source: Ambulatory Visit | Attending: Pulmonary Disease

## 2023-10-14 VITALS — Wt 139.8 lb

## 2023-10-14 DIAGNOSIS — J449 Chronic obstructive pulmonary disease, unspecified: Secondary | ICD-10-CM

## 2023-10-14 NOTE — Progress Notes (Signed)
 Daily Session Note  Patient Details  Name: Joy Williams MRN: [id number] Date of Birth: 05/01/1951 Referring Provider:   Gattis Kass Pulmonary Rehab Walk Test from 09/17/2023 in [facility] for Heart, Vascular, & Lung Health  Referring Provider [place]       Encounter Date: 10/14/2023  Check In:  Session Check In - 10/14/23 1041       Check-In   Supervising physician immediately available to respond to emergencies [facility] immediately available    Physician(s) Charles Connor, NP    [facility]    Staff Present Willard Harman, RN, Shasta Deist, MS, ACSM-CEP, Exercise Physiologist;Samantha Belarus, RD, LDN;Kirin Pastorino [facility], ACSM-CEP, Exercise Physiologist    Virtual Visit No    Medication changes reported     No    Fall or balance concerns reported    No    Tobacco Cessation No Change    Warm-up and Cool-down Performed as group-led instruction    Resistance Training Performed Yes    VAD Patient? No    PAD/SET Patient? No      Pain Assessment   Currently in Pain? Yes    Pain Score 3     Pain Location Chest    Pain Orientation Right             Capillary Blood Glucose: No results found for this or any previous visit (from the past 24 hours).   Exercise Prescription Changes - 10/14/23 1200       Response to Exercise   Blood Pressure (Admit) 140/84    Blood Pressure (Exercise) 150/70    Blood Pressure (Exit) 130/80    Heart Rate (Admit) 70 bpm    Heart Rate (Exercise) 96 bpm    Heart Rate (Exit) 73 bpm    Oxygen Saturation (Admit) 94 %    Oxygen Saturation (Exercise) 95 %    Oxygen Saturation (Exit) 97 %    Rating of Perceived Exertion (Exercise) 13    Perceived Dyspnea (Exercise) 2    Duration Continue with 30 min of aerobic exercise without signs/symptoms of physical distress.    Intensity THRR unchanged      Progression   Progression Continue to progress workloads to maintain intensity without signs/symptoms  of physical distress.    Average METs 1.6      Resistance Training   Training Prescription Yes    Weight red bands    Reps 10-15    Time 10 Minutes      NuStep   Level 2    SPM 68    Minutes 15    METs 1.6      Track   Laps 11    Minutes 15    METs 2.69             Social History   Tobacco Use  Smoking Status Former   Current packs/day: 0.00   Average packs/day: 1 pack/day for 40.1 years (40.1 ttl pk-yrs)   Types: Cigarettes   Start date: 05/21/1983   Quit date: 06/12/2023   Years since quitting: 0.3   Passive exposure: Never  Smokeless Tobacco Never  Tobacco Comments   Quit 06/12/23    Goals Met:  Independence with exercise equipment Exercise tolerated well No report of concerns or symptoms today Strength training completed today  Goals Unmet:  Not Applicable  Comments: Service time is from 1006 to 1128.    Dr. Genetta Kenning is Medical Director for Pulmonary Rehab at  [facility].

## 2023-10-16 ENCOUNTER — Encounter (HOSPITAL_COMMUNITY)
Admission: RE | Admit: 2023-10-16 | Discharge: 2023-10-16 | Disposition: A | Discharge status: Home / Self Care | Source: Ambulatory Visit | Attending: Pulmonary Disease

## 2023-10-16 DIAGNOSIS — J449 Chronic obstructive pulmonary disease, unspecified: Secondary | ICD-10-CM

## 2023-10-16 NOTE — Progress Notes (Signed)
 Daily Session Note  Patient Details  Name: Joy Williams MRN: [id number] Date of Birth: 1950/11/10 Referring Provider:   Gattis Kass Pulmonary Rehab Walk Test from 09/17/2023 in [facility] for [facility], Vascular, & Lung Health  Referring Provider [place]       Encounter Date: 10/16/2023  Check In:  Session Check In - 10/16/23 1023       Check-In   Supervising physician immediately available to respond to emergencies [facility] immediately available    Physician(s) Marlana Silvan, NP    [facility]    Staff Present Willard Harman, RN, Shasta Deist, MS, ACSM-CEP, Exercise Physiologist;Samantha Belarus, RD, LDN;Rocquel Askren Regis Captain BS, ACSM-CEP, Exercise Physiologist;Casey Felipe Horton, RT    Virtual Visit No    Medication changes reported     No    Fall or balance concerns reported    No    Tobacco Cessation No Change    Warm-up and Cool-down Performed as group-led instruction    Resistance Training Performed Yes    VAD Patient? No    PAD/SET Patient? No      Pain Assessment   Currently in Pain? Yes    Pain Score 3     Pain Location Chest    Pain Type Surgical pain             Capillary Blood Glucose: No results found for this or any previous visit (from the past 24 hours).    Social History   Tobacco Use  Smoking Status Former   Current packs/day: 0.00   Average packs/day: 1 pack/day for 40.1 years (40.1 ttl pk-yrs)   Types: Cigarettes   Start date: 05/21/1983   Quit date: 06/12/2023   Years since quitting: 0.3   Passive exposure: Never  Smokeless Tobacco Never  Tobacco Comments   Quit 06/12/23    Goals Met:  Independence with exercise equipment Exercise tolerated well No report of concerns or symptoms today Strength training completed today  Goals Unmet:  Not Applicable  Comments: Service time is from 1008 to 1137.    Dr. Genetta Kenning is Medical Director for Pulmonary Rehab at [facility].

## 2023-10-20 ENCOUNTER — Encounter: Payer: Self-pay | Admitting: Internal Medicine

## 2023-10-21 ENCOUNTER — Encounter (HOSPITAL_COMMUNITY)
Admission: RE | Admit: 2023-10-21 | Discharge: 2023-10-21 | Disposition: A | Discharge status: Home / Self Care | Source: Ambulatory Visit | Attending: Pulmonary Disease | Admitting: Pulmonary Disease

## 2023-10-21 DIAGNOSIS — J449 Chronic obstructive pulmonary disease, unspecified: Secondary | ICD-10-CM | POA: Insufficient documentation

## 2023-10-21 NOTE — Progress Notes (Signed)
 Daily Session Note  Patient Details  Name: Joy Williams MRN: [id number] Date of Birth: 1951-01-29 Referring Provider:   Gattis Kass Pulmonary Rehab Walk Test from 09/17/2023 in [facility], Vascular, & [facility]  Referring Provider [place]       Encounter Date: 10/21/2023  Check In:  Session Check In - 10/21/23 1057       Check-In   Supervising physician immediately available to respond to emergencies [facility] MD immediately available    Physician(s) Koren Persons, NP    [facility]    Staff Present Atlas Lea, MS, ACSM-CEP, Exercise Physiologist;Randi Rochelle Chu, ACSM-CEP, Exercise Physiologist;Shacola Schussler Vernadine Golas Belarus, RD, LDN    Virtual Visit No    Medication changes reported     No    Fall or balance concerns reported    No    Tobacco Cessation No Change    Warm-up and Cool-down Performed as group-led instruction    Resistance Training Performed Yes    VAD Patient? No    PAD/SET Patient? No      Pain Assessment   Currently in Pain? No/denies    Multiple Pain Sites No             Capillary Blood Glucose: No results found for this or any previous visit (from the past 24 hours).    Social History   Tobacco Use  Smoking Status Former   Current packs/day: 0.00   Average packs/day: 1 pack/day for 40.1 years (40.1 ttl pk-yrs)   Types: Cigarettes   Start date: 05/21/1983   Quit date: 06/12/2023   Years since quitting: 0.3   Passive exposure: Never  Smokeless Tobacco Never  Tobacco Comments   Quit 06/12/23    Goals Met:  Proper associated with RPD/PD & O2 Sat Independence with exercise equipment Exercise tolerated well No report of concerns or symptoms today Strength training completed today  Goals Unmet:  Not Applicable  Comments: Service time is from 1007 to 1130.    Dr. Genetta Kenning is Medical Director for Pulmonary Rehab at [facility].

## 2023-10-23 ENCOUNTER — Encounter (HOSPITAL_COMMUNITY)

## 2023-10-24 ENCOUNTER — Inpatient Hospital Stay: Payer: No Typology Code available for payment source | Attending: Physician Assistant

## 2023-10-28 ENCOUNTER — Encounter (HOSPITAL_COMMUNITY)
Admission: RE | Admit: 2023-10-28 | Discharge: 2023-10-28 | Disposition: A | Discharge status: Home / Self Care | Source: Ambulatory Visit | Attending: Pulmonary Disease | Admitting: Pulmonary Disease

## 2023-10-28 ENCOUNTER — Other Ambulatory Visit: Payer: Self-pay | Admitting: *Deleted

## 2023-10-28 VITALS — Wt 138.2 lb

## 2023-10-28 DIAGNOSIS — J449 Chronic obstructive pulmonary disease, unspecified: Secondary | ICD-10-CM | POA: Diagnosis not present

## 2023-10-28 DIAGNOSIS — I6523 Occlusion and stenosis of bilateral carotid arteries: Secondary | ICD-10-CM

## 2023-10-28 NOTE — Progress Notes (Signed)
 Daily Session Note  Patient Details  Name: Joy Williams MRN: [id number] Date of Birth: [birth year] Referring Provider:   Gattis Kass Pulmonary Rehab Walk Test from 09/17/2023 in [facility] for Heart, Vascular, & Lung Health  Referring Provider [place]       Encounter Date: 10/28/2023  Check In:  Session Check In - 10/28/23 1106       Check-In   Supervising physician immediately available to respond to emergencies [facility] immediately available    Physician(s) Palmer Bobo, NP    [facility]    Staff Present Atlas Lea, MS, ACSM-CEP, Exercise Physiologist;Randi Rochelle Chu, ACSM-CEP, Exercise Physiologist;Epiphany Seltzer Vernadine Golas Belarus, RD, Toy Freund, RN, BSN    Virtual Visit No    Medication changes reported     No    Fall or balance concerns reported    No    Tobacco Cessation No Change    Warm-up and Cool-down Performed as group-led instruction    Resistance Training Performed Yes    VAD Patient? No    PAD/SET Patient? No      Pain Assessment   Currently in Pain? No/denies    Pain Score 0-No pain    Multiple Pain Sites No             Capillary Blood Glucose: No results found for this or any previous visit (from the past 24 hours).   Exercise Prescription Changes - 10/28/23 1200       Response to Exercise   Blood Pressure (Admit) 104/70    Blood Pressure (Exercise) 120/60    Blood Pressure (Exit) 96/72    Heart Rate (Admit) 73 bpm    Heart Rate (Exercise) 85 bpm    Heart Rate (Exit) 84 bpm    Oxygen Saturation (Admit) 96 %    Oxygen Saturation (Exercise) 98 %    Oxygen Saturation (Exit) 97 %    Rating of Perceived Exertion (Exercise) 11    Perceived Dyspnea (Exercise) 1    Duration Continue with 30 min of aerobic exercise without signs/symptoms of physical distress.    Intensity THRR unchanged      Progression   Progression Continue to progress workloads to maintain intensity without  signs/symptoms of physical distress.      Resistance Training   Training Prescription Yes    Weight blue bands    Reps 10-15    Time 10 Minutes      NuStep   Level 3    SPM 73    Minutes 15    METs 2.1      Track   Laps 12    Minutes 15    METs 2.85             Social History   Tobacco Use  Smoking Status Former   Current packs/day: 0.00   Average packs/day: 1 pack/day for 40.1 years (40.1 ttl pk-yrs)   Types: Cigarettes   Start date: 05/21/1983   Quit date: 06/12/2023   Years since quitting: 0.3   Passive exposure: Never  Smokeless Tobacco Never  Tobacco Comments   Quit 06/12/23    Goals Met:  Proper associated with RPD/PD & O2 Sat Independence with exercise equipment Exercise tolerated well No report of concerns or symptoms today Strength training completed today  Goals Unmet:  Not Applicable  Comments: Service time is from 1012 to 1142.    Dr. Genetta Kenning is Medical Director for Pulmonary Rehab at [facility].

## 2023-10-28 NOTE — Progress Notes (Signed)
 Former Dr. Ardell Beauvais pt and is now established and has seen Dr. Chancy Comber, as her new Cardiologist.   Dr. Ardell Beauvais previously placed a yearly carotid US  order on the pt, and order has since expired.   Pt will be getting her yearly carotids done soon, and will need an updated order placed in the system, and under Dr. Chancy Comber.    Updated carotid US  order placed in the system under Dr. Chancy Comber.    Will make PV Scheduler aware of this.

## 2023-10-28 NOTE — Progress Notes (Signed)
 I reached out to the pt to assist with scheduling her follow up Chest CT prior to her follow up appt with C.Heilingoetter, Onc PA on 6/18. Pt wasn't sure why she needed a follow up appt and CT scan now that she had had surgery. I explained to the pt that she will still need monitoring with regular CT scans. Pt wished to speak to her surgeons at [facility] before scheduling her CT. I sent the pt a MyChart message with the number for Central Scheduling so she can schedule the CT at her convenience. I also included my direct contact information in the event she has any questions or concerns.

## 2023-10-29 NOTE — Progress Notes (Signed)
 Pulmonary Individual Treatment Plan  Patient Details  Name: Joy Williams MRN: [id number] Date of Birth: 1950-06-15 Referring Provider:   Gattis Kass Pulmonary Rehab Walk Test from 09/17/2023 in [facility]  Referring Provider [place]       Initial Encounter Date:  Flowsheet Row Pulmonary Rehab Walk Test from 09/17/2023 in [facility]  Date 09/17/23       Visit Diagnosis: Chronic obstructive pulmonary disease, unspecified COPD type (HCC)  Patient's Home Medications on Admission:   Current Outpatient Medications:    acetaminophen  (TYLENOL ) 500 MG tablet, Take 1,000 mg by mouth every 6 (six) hours as needed for mild pain (pain score 1-3) or headache., Disp: , Rfl:    Cholecalciferol (VITAMIN D ) 125 MCG (5000 UT) CAPS, Take 5,000 Units by mouth daily., Disp: , Rfl:    clopidogrel  (PLAVIX ) 75 MG tablet, Take 1 tablet (75 mg total) by mouth daily., Disp: 90 tablet, Rfl: 2   EPINEPHrine  0.3 mg/0.3 mL IJ SOAJ injection, Inject 0.3 mg into the muscle as needed., Disp: 1 each, Rfl: 1   ferrous sulfate  325 (65 FE) MG tablet, Take 1 tablet (325 mg total) by mouth daily. (Patient taking differently: Take 325 mg by mouth daily. OTC), Disp: 90 tablet, Rfl: 3   fluticasone  (FLONASE ) 50 MCG/ACT nasal spray, Place 1 spray into both nostrils daily., Disp: , Rfl:    Fluticasone -Umeclidin-Vilant (TRELEGY ELLIPTA ) 100-62.5-25 MCG/ACT AEPB, Inhale 1 puff into the lungs daily., Disp: , Rfl:    gabapentin  (NEURONTIN ) 300 MG capsule, Take 1 capsule (300 mg total) by mouth 2 (two) times daily., Disp: 60 capsule, Rfl: 2   hydrALAZINE  (APRESOLINE ) 25 MG tablet, Take 1 tablet (25 mg total) by mouth daily as needed (for systolic BP>150)., Disp: 30 tablet, Rfl: 9   ibuprofen (ADVIL) 200 MG tablet, Take 400 mg by mouth every 6 (six) hours as needed for moderate pain (pain score 4-6)., Disp: , Rfl:     lactulose  (CHRONULAC ) 10 GM/15ML solution, Take 45 mLs (30 g total) by mouth 2 (two) times daily as needed for moderate constipation or severe constipation., Disp: 946 mL, Rfl: 0   levothyroxine  (SYNTHROID ) 150 MCG tablet, Take 1 tablet (150 mcg total) by mouth daily., Disp: 90 tablet, Rfl: 3   lidocaine  (LIDODERM ) 5 %, Place 1 patch onto the skin daily. Remove & Discard patch within 12 hours or as directed by MD, Disp: 30 patch, Rfl: 0   MELATONIN PO, Take 1 tablet by mouth at bedtime as needed (sleep)., Disp: , Rfl:    methocarbamol  (ROBAXIN ) 500 MG tablet, Take 1 tablet (500 mg total) by mouth every 6 (six) hours as needed for muscle spasms., Disp: 30 tablet, Rfl: 2   morphine  (MSIR) 15 MG tablet, Take 0.5-1 tablets (7.5-15 mg total) by mouth every 6 (six) hours as needed (breakthrough pain)., Disp: 30 tablet, Rfl: 0   Multiple Vitamins-Minerals (MULTIVITAMIN WITH MINERALS) tablet, Take 1 tablet by mouth daily., Disp: , Rfl:    nitroGLYCERIN  (NITROSTAT ) 0.4 MG SL tablet, Place 1 tablet (0.4 mg total) under the tongue every 5 (five) minutes as needed., Disp: 10 tablet, Rfl: 1   rosuvastatin  (CRESTOR ) 20 MG tablet, Take 1 tablet (20 mg total) by mouth every evening., Disp: 90 tablet, Rfl: 3   sennosides-docusate sodium  (SENOKOT-S) 8.6-50 MG tablet, Take 1 tablet by mouth daily., Disp: , Rfl:    TRELEGY ELLIPTA  100-62.5-25 MCG/ACT  AEPB, INHALE 1 PUFF BY MOUTH EVERY DAY, Disp: 60 each, Rfl: 5   Turmeric 500 MG CAPS, Take 500 mg by mouth daily., Disp: , Rfl:    vitamin B-12 (CYANOCOBALAMIN ) 1000 MCG tablet, Take 1 tablet (1,000 mcg total) by mouth daily., Disp: , Rfl:    Zinc 30 MG TABS, Take 30 mg by mouth., Disp: , Rfl:   Past Medical History: Past Medical History:  Diagnosis Date   Allergy    Asthma    pt denies this, but is on Dulera    Carotid artery disease (HCC) 2019   1-39% stenosis by Dopplers    Chronic diastolic congestive heart failure (HCC)    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   Complication of anesthesia    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 [facility] (had to have botox injection)   COPD (chronic obstructive pulmonary disease) (HCC)    Family history of adverse reaction to anesthesia    Mother- very sensitive to medication   Heart murmur    MVP   Hypothyroidism    Lung cancer (HCC) 12/30/2014   Synchronous primaries:  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, each treated by wedge resection with post-op adjuvant chemoRx at [facility]   MVP (mitral valve prolapse) 2018   s/p Minimally-Invasive Mitral Valve Replacement w/  [facility] Mitral bovine bioprosthetic tissue valve (size 31mm, model # 7300TFX, serial # [id number])   PONV (postoperative nausea and vomiting)    Raynaud's syndrome    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   31 mm Edwards Magna mitral bovine bioprosthetic tissue valve placed via right mini thoracotomy approach   Severe mitral regurgitation 11/15/2014   Shortness of breath dyspnea    with exertion   STD (sexually transmitted disease)    STEMI (ST elevation myocardial infarction) (HCC) 10/24/2020   Telangiectasia     Tobacco Use: Social History   Tobacco Use  Smoking Status Former   Current packs/day: 0.00   Average packs/day: 1 pack/day for 40.1 years (40.1 ttl pk-yrs)   Types: Cigarettes   Start date: 05/21/1983   Quit date: 06/12/2023   Years since quitting: 0.3   Passive exposure: Never  Smokeless Tobacco Never  Tobacco Comments   Quit 06/12/23    Labs: Review Flowsheet  More data exists      Latest Ref Rng & Units 08/07/2020 10/24/2020 05/30/2022 08/28/2022 08/17/2023  Labs for ITP Cardiac and Pulmonary Rehab  Cholestrol 100 - 199 mg/dL 413  244  010  272  -  LDL (calc) 0 - 99 mg/dL 50  55  54  55  -  HDL-C >39 mg/dL 46  60  66  67  -  Trlycerides 0 - 149 mg/dL 536  644  98  034  -  Hemoglobin A1c 4.8 - 5.6 % 5.6  5.5  - - -  TCO2 22 - 32 mmol/L - 24  - - 29     Capillary  Blood Glucose: Lab Results  Component Value Date   GLUCAP 92 01/07/2023   GLUCAP 94 11/03/2020   GLUCAP 85 11/03/2020   GLUCAP 81 03/11/2018   GLUCAP 86 07/03/2016     Pulmonary Assessment Scores:  Pulmonary Assessment Scores     Row Name 07/07/23 1021 07/24/23 1537 07/29/23 1531     ADL UCSD   ADL Phase Entry Exit Exit   SOB Score total 6 7 --     CAT Score  CAT Score 11 12 --     mMRC Score   mMRC Score 2 -- 2    Row Name 09/17/23 1127         ADL UCSD   ADL Phase Entry     SOB Score total 31       CAT Score   CAT Score 7       mMRC Score   mMRC Score 2             UCSD: Self-administered rating of dyspnea associated with activities of daily living (ADLs) 6-point scale (0 = not at all to 5 = maximal or unable to do because of breathlessness)  Scoring Scores range from 0 to 120.  Minimally important difference is 5 units  CAT: CAT can identify the health impairment of COPD patients and is better correlated with disease progression.  CAT has a scoring range of zero to 40. The CAT score is classified into four groups of low (less than 10), medium (10 - 20), high (21-30) and very high (31-40) based on the impact level of disease on health status. A CAT score over 10 suggests significant symptoms.  A worsening CAT score could be explained by an exacerbation, poor medication adherence, poor inhaler technique, or progression of COPD or comorbid conditions.  CAT MCID is 2 points  mMRC: mMRC (Modified Medical Research Council) Dyspnea Scale is used to assess the degree of baseline functional disability in patients of respiratory disease due to dyspnea. No minimal important difference is established. A decrease in score of 1 point or greater is considered a positive change.   Pulmonary Function Assessment:  Pulmonary Function Assessment - 09/17/23 1109       Breath   Bilateral Breath Sounds Rales    Shortness of Breath Yes;Limiting activity;Fear of  Shortness of Breath             Exercise Target Goals: Exercise Program Goal: Individual exercise prescription set using results from initial 6 min walk test and THRR while considering  patient's activity barriers and safety.   Exercise Prescription Goal: Initial exercise prescription builds to 30-45 minutes a day of aerobic activity, 2-3 days per week.  Home exercise guidelines will be given to patient during program as part of exercise prescription that the participant will acknowledge.  Activity Barriers & Risk Stratification:  Activity Barriers & Cardiac Risk Stratification - 09/17/23 1135       Activity Barriers & Cardiac Risk Stratification   Activity Barriers Deconditioning;Muscular Weakness;Shortness of Breath;Incisional Pain    Cardiac Risk Stratification Moderate             6 Minute Walk:  6 Minute Walk     Row Name 07/07/23 1109 07/29/23 1518 09/17/23 1052     6 Minute Walk   Phase Initial Initial Initial   Distance 1300 feet 1460 feet 790 feet   Distance % Change -- 12.6 % --   Distance Feet Change -- 160 ft --   Walk Time 6 minutes 6 minutes 6 minutes   # of Rest Breaks 0 0 2  1:50-2:00, 4:10-4:34   MPH 2.46 2.77 0.6   METS 3.1 3.17 1.95   RPE 9 11 13    Perceived Dyspnea  0 1 1   VO2 Peak 10.85 11.09 6.84   Symptoms No No No   Resting HR 67 bpm 64 bpm 71 bpm   Resting BP 146/82 120/60 126/82   Resting Oxygen Saturation  100 % 91 %  100 %   Exercise Oxygen Saturation  during 6 min walk 95 % 91 % 95 %   Max Ex. HR 84 bpm 93 bpm 95 bpm   Max Ex. BP 160/80 116/74 134/70   2 Minute Post BP 170/80 114/70 120/70     Interval HR   1 Minute HR 71 74 82   2 Minute HR 78 85 86   3 Minute HR 80 86 90   4 Minute HR 79 85 95   5 Minute HR 82 92 95   6 Minute HR 84 93 93   2 Minute Post HR 68 77 85   Interval Heart Rate? Yes -- Yes     Interval Oxygen   Interval Oxygen? Yes -- Yes   Baseline Oxygen Saturation % 100 % 95 % 100 %   1 Minute Oxygen  Saturation % 97 % 91 % 96 %   1 Minute Liters of Oxygen 0 L 0 L 0 L   2 Minute Oxygen Saturation % 95 % 93 % 98 %   2 Minute Liters of Oxygen 0 L 0 L 0 L   3 Minute Oxygen Saturation % 98 % 92 % 97 %   3 Minute Liters of Oxygen 0 L 0 L 0 L   4 Minute Oxygen Saturation % 98 % 96 % 95 %   4 Minute Liters of Oxygen 0 L 0 L 0 L   5 Minute Oxygen Saturation % 99 % 94 % 96 %   5 Minute Liters of Oxygen 0 L 0 L 0 L   6 Minute Oxygen Saturation % 98 % 96 % 94 %   6 Minute Liters of Oxygen 0 L 0 L 0 L   2 Minute Post Oxygen Saturation % 99 % 97 % 100 %   2 Minute Post Liters of Oxygen 0 L 0 L 0 L            Oxygen Initial Assessment:  Oxygen Initial Assessment - 09/17/23 1109       Home Oxygen   Home Oxygen Device None    Sleep Oxygen Prescription None    Home Exercise Oxygen Prescription None    Home Resting Oxygen Prescription None      Initial 6 min Walk   Oxygen Used None      Program Oxygen Prescription   Program Oxygen Prescription None      Intervention   Short Term Goals To learn and understand importance of maintaining oxygen saturations>88%;To learn and demonstrate proper use of respiratory medications;To learn and understand importance of monitoring SPO2 with pulse oximeter and demonstrate accurate use of the pulse oximeter.;To learn and demonstrate proper pursed lip breathing techniques or other breathing techniques.     Long  Term Goals Maintenance of O2 saturations>88%;Compliance with respiratory medication;Verbalizes importance of monitoring SPO2 with pulse oximeter and return demonstration;Exhibits proper breathing techniques, such as pursed lip breathing or other method taught during program session;Demonstrates proper use of MDI's             Oxygen Re-Evaluation:  Oxygen Re-Evaluation     Row Name 09/26/23 0935 10/24/23 0916           Program Oxygen Prescription   Program Oxygen Prescription None None        Home Oxygen   Home Oxygen Device None  None      Sleep Oxygen Prescription None None      Home Exercise Oxygen Prescription None None  Home Resting Oxygen Prescription None None        Goals/Expected Outcomes   Short Term Goals To learn and understand importance of maintaining oxygen saturations>88%;To learn and demonstrate proper use of respiratory medications;To learn and understand importance of monitoring SPO2 with pulse oximeter and demonstrate accurate use of the pulse oximeter.;To learn and demonstrate proper pursed lip breathing techniques or other breathing techniques.  To learn and understand importance of maintaining oxygen saturations>88%;To learn and demonstrate proper use of respiratory medications;To learn and understand importance of monitoring SPO2 with pulse oximeter and demonstrate accurate use of the pulse oximeter.;To learn and demonstrate proper pursed lip breathing techniques or other breathing techniques.       Long  Term Goals Maintenance of O2 saturations>88%;Compliance with respiratory medication;Verbalizes importance of monitoring SPO2 with pulse oximeter and return demonstration;Exhibits proper breathing techniques, such as pursed lip breathing or other method taught during program session;Demonstrates proper use of MDI's Maintenance of O2 saturations>88%;Compliance with respiratory medication;Verbalizes importance of monitoring SPO2 with pulse oximeter and return demonstration;Exhibits proper breathing techniques, such as pursed lip breathing or other method taught during program session;Demonstrates proper use of MDI's      Goals/Expected Outcomes Compliance and understanding of oxygen saturation monitoring and breathing techniques to decrease shortness of breath. Compliance and understanding of oxygen saturation monitoring and breathing techniques to decrease shortness of breath.               Oxygen Discharge (Final Oxygen Re-Evaluation):  Oxygen Re-Evaluation - 10/24/23 0916       Program Oxygen  Prescription   Program Oxygen Prescription None      Home Oxygen   Home Oxygen Device None    Sleep Oxygen Prescription None    Home Exercise Oxygen Prescription None    Home Resting Oxygen Prescription None      Goals/Expected Outcomes   Short Term Goals To learn and understand importance of maintaining oxygen saturations>88%;To learn and demonstrate proper use of respiratory medications;To learn and understand importance of monitoring SPO2 with pulse oximeter and demonstrate accurate use of the pulse oximeter.;To learn and demonstrate proper pursed lip breathing techniques or other breathing techniques.     Long  Term Goals Maintenance of O2 saturations>88%;Compliance with respiratory medication;Verbalizes importance of monitoring SPO2 with pulse oximeter and return demonstration;Exhibits proper breathing techniques, such as pursed lip breathing or other method taught during program session;Demonstrates proper use of MDI's    Goals/Expected Outcomes Compliance and understanding of oxygen saturation monitoring and breathing techniques to decrease shortness of breath.             Initial Exercise Prescription:  Initial Exercise Prescription - 09/17/23 1000       Date of Initial Exercise RX and Referring Provider   Date 09/17/23    Referring Provider D'amico    Expected Discharge Date 12/11/23      NuStep   Level 1    SPM 60    Minutes 15    METs 1.5      Track   Minutes 15    METs 1.95      Prescription Details   Frequency (times per week) 2    Duration Progress to 30 minutes of continuous aerobic without signs/symptoms of physical distress      Intensity   THRR 40-80% of Max Heartrate 59-132    Ratings of Perceived Exertion 11-13    Perceived Dyspnea 0-4      Progression   Progression Continue to progress workloads to maintain  intensity without signs/symptoms of physical distress.      Resistance Training   Training Prescription Yes    Weight red bands    Reps  10-15             Perform Capillary Blood Glucose checks as needed.  Exercise Prescription Changes:   Exercise Prescription Changes     Row Name 07/22/23 1500 09/30/23 1100 10/14/23 1200 10/28/23 1200       Response to Exercise   Blood Pressure (Admit) 102/60 128/76 140/84 104/70    Blood Pressure (Exercise) 122/70 130/68 150/70 120/60    Blood Pressure (Exit) 92/56 112/62 130/80 96/72    Heart Rate (Admit) 65 bpm 65 bpm 70 bpm 73 bpm    Heart Rate (Exercise) 82 bpm 88 bpm 96 bpm 85 bpm    Heart Rate (Exit) 71 bpm 67 bpm 73 bpm 84 bpm    Oxygen Saturation (Admit) 98 % 100 % 94 % 96 %    Oxygen Saturation (Exercise) 96 % 93 % 95 % 98 %    Oxygen Saturation (Exit) 96 % 97 % 97 % 97 %    Rating of Perceived Exertion (Exercise) 11 11 13 11     Perceived Dyspnea (Exercise) 0.5 1 2 1     Duration Continue with 30 min of aerobic exercise without signs/symptoms of physical distress. Continue with 30 min of aerobic exercise without signs/symptoms of physical distress. Continue with 30 min of aerobic exercise without signs/symptoms of physical distress. Continue with 30 min of aerobic exercise without signs/symptoms of physical distress.    Intensity THRR unchanged THRR unchanged THRR unchanged THRR unchanged      Progression   Progression Continue to progress workloads to maintain intensity without signs/symptoms of physical distress. Continue to progress workloads to maintain intensity without signs/symptoms of physical distress. Continue to progress workloads to maintain intensity without signs/symptoms of physical distress. Continue to progress workloads to maintain intensity without signs/symptoms of physical distress.    Average METs -- -- 1.6 --      Resistance Training   Training Prescription Yes Yes Yes Yes    Weight blue bands red bands red bands blue bands    Reps 10-15 10-15 10-15 10-15    Time 10 Minutes 10 Minutes 10 Minutes 10 Minutes      Treadmill   MPH 2.5 -- -- --     Grade 1.5 -- -- --    Minutes 215 -- -- --    METs 2.8 -- -- --      Bike   Level 2.5 -- -- --    Minutes 15 -- -- --    METs 3.3 -- -- --      NuStep   Level -- 1 2 3     SPM -- 77 68 73    Minutes -- 15 15 15     METs -- 1.9 1.6 2.1      Track   Laps -- 8 11 12     Minutes -- 15 15 15     METs -- 2.23 2.69 2.85             Exercise Comments:   Exercise Comments     Row Name 07/08/23 1540 09/23/23 1204         Exercise Comments Pt completed first day of exercise. She walked on the treadmill for 15 min, speed 1.7 mph, METs 2.2. She then exercised on the scifit bike for 15 min, level 1, METs 2.6. tolerated well. Performed  warm up and cool down without restrictions. Discussed METs with good understanding. Pt completed first day of group exercise. She walked the track for 15 min with rest stops, METs 2.54. She then exercised on the recumbent stepper for 15 min, level 1, METs 1.7. Pt performed warm up and cool down with intermittent rests. She understands METs.               Exercise Goals and Review:   Exercise Goals     Row Name 07/07/23 1022 09/17/23 1056           Exercise Goals   Increase Physical Activity Yes Yes      Intervention Provide advice, education, support and counseling about physical activity/exercise needs.;Develop an individualized exercise prescription for aerobic and resistive training based on initial evaluation findings, risk stratification, comorbidities and participant's personal goals. Provide advice, education, support and counseling about physical activity/exercise needs.;Develop an individualized exercise prescription for aerobic and resistive training based on initial evaluation findings, risk stratification, comorbidities and participant's personal goals.      Expected Outcomes Short Term: Attend rehab on a regular basis to increase amount of physical activity.;Long Term: Add in home exercise to make exercise part of routine and to  increase amount of physical activity.;Long Term: Exercising regularly at least 3-5 days a week. Short Term: Attend rehab on a regular basis to increase amount of physical activity.;Long Term: Add in home exercise to make exercise part of routine and to increase amount of physical activity.;Long Term: Exercising regularly at least 3-5 days a week.      Increase Strength and Stamina Yes Yes      Intervention Provide advice, education, support and counseling about physical activity/exercise needs.;Develop an individualized exercise prescription for aerobic and resistive training based on initial evaluation findings, risk stratification, comorbidities and participant's personal goals. Provide advice, education, support and counseling about physical activity/exercise needs.;Develop an individualized exercise prescription for aerobic and resistive training based on initial evaluation findings, risk stratification, comorbidities and participant's personal goals.      Expected Outcomes Short Term: Increase workloads from initial exercise prescription for resistance, speed, and METs.;Short Term: Perform resistance training exercises routinely during rehab and add in resistance training at home;Long Term: Improve cardiorespiratory fitness, muscular endurance and strength as measured by increased METs and functional capacity ( ) Short Term: Increase workloads from initial exercise prescription for resistance, speed, and METs.;Short Term: Perform resistance training exercises routinely during rehab and add in resistance training at home;Long Term: Improve cardiorespiratory fitness, muscular endurance and strength as measured by increased METs and functional capacity ( )      Able to understand and use rate of perceived exertion (RPE) scale Yes Yes      Intervention Provide education and explanation on how to use RPE scale Provide education and explanation on how to use RPE scale      Expected Outcomes Short Term:  Able to use RPE daily in rehab to express subjective intensity level;Long Term:  Able to use RPE to guide intensity level when exercising independently Short Term: Able to use RPE daily in rehab to express subjective intensity level;Long Term:  Able to use RPE to guide intensity level when exercising independently      Able to understand and use Dyspnea scale Yes Yes      Intervention Provide education and explanation on how to use Dyspnea scale Provide education and explanation on how to use Dyspnea scale      Expected Outcomes Short Term: Able to  use Dyspnea scale daily in rehab to express subjective sense of shortness of breath during exertion;Long Term: Able to use Dyspnea scale to guide intensity level when exercising independently Short Term: Able to use Dyspnea scale daily in rehab to express subjective sense of shortness of breath during exertion;Long Term: Able to use Dyspnea scale to guide intensity level when exercising independently      Knowledge and understanding of Target Heart Rate Range (THRR) Yes Yes      Intervention Provide education and explanation of THRR including how the numbers were predicted and where they are located for reference Provide education and explanation of THRR including how the numbers were predicted and where they are located for reference      Expected Outcomes Short Term: Able to state/look up THRR;Long Term: Able to use THRR to govern intensity when exercising independently;Short Term: Able to use daily as guideline for intensity in rehab Short Term: Able to state/look up THRR;Long Term: Able to use THRR to govern intensity when exercising independently;Short Term: Able to use daily as guideline for intensity in rehab      Able to check pulse independently -- Yes      Intervention -- Provide education and demonstration on how to check pulse in carotid and radial arteries.;Review the importance of being able to check your own pulse for safety during independent  exercise      Expected Outcomes -- Short Term: Able to explain why pulse checking is important during independent exercise;Long Term: Able to check pulse independently and accurately      Understanding of Exercise Prescription Yes Yes      Intervention Provide education, explanation, and written materials on patient's individual exercise prescription Provide education, explanation, and written materials on patient's individual exercise prescription      Expected Outcomes Short Term: Able to explain program exercise prescription;Long Term: Able to explain home exercise prescription to exercise independently Short Term: Able to explain program exercise prescription;Long Term: Able to explain home exercise prescription to exercise independently               Exercise Goals Re-Evaluation :  Exercise Goals Re-Evaluation     Row Name 07/08/23 0744 07/29/23 1532 09/26/23 0932 10/24/23 0914       Exercise Goal Re-Evaluation   Exercise Goals Review Increase Physical Activity;Able to understand and use Dyspnea scale;Understanding of Exercise Prescription;Increase Strength and Stamina;Knowledge and understanding of Target Heart Rate Range (THRR);Able to understand and use rate of perceived exertion (RPE) scale Increase Physical Activity;Able to understand and use Dyspnea scale;Understanding of Exercise Prescription;Increase Strength and Stamina;Knowledge and understanding of Target Heart Rate Range (THRR);Able to understand and use rate of perceived exertion (RPE) scale Increase Physical Activity;Able to understand and use Dyspnea scale;Understanding of Exercise Prescription;Increase Strength and Stamina;Knowledge and understanding of Target Heart Rate Range (THRR);Able to understand and use rate of perceived exertion (RPE) scale Increase Physical Activity;Able to understand and use Dyspnea scale;Understanding of Exercise Prescription;Increase Strength and Stamina;Knowledge and understanding of Target Heart  Rate Range (THRR);Able to understand and use rate of perceived exertion (RPE) scale    Comments Pt to begin exercise 07/08/23. Will progress as tolerated. Pt completed 6 exercise sessions. She needed to graduate early due to the need for lung resection. She plans to get another referral for PR after she recovers. She exercised on the treadmill, speed 2.5, incline 1.5, METs 3.1. She then exercises on the scifit bike for 15 min level 2.5, METs 3.5. Tolerated well. Increased her  6 min walk test by 12.6 %, 160 ft. Her SOB, CAT, and MMRC scores did not change with the short duration. Encouraged pt to continue walking at home. Anny has completed 2 exercise sessions. Pt returned to PR after surgery. Golden exercises for 15 min on the track and Nustep. She averages 2.69 METs on the track and 2.0 METs at level 1 on the Nustep. She performs the warmup and cooldown standing/seated due to her post-surgery pain. Aveen is very limited by her pain. She tries to exercise through the pain. Will continue to monitor and progress as able. Krystin has completed 8 exercise sessions. She exercises for 15 min on the track and Nustep. Ralph averages 2.54 METs on the track and 2 METs at level 2 on the Nustep. She performs the warmup and cooldown standing/ seated dependent on her post-op pain. Joelly is still limited by her pain. It is has been difficult to progress her. Analeigha remains motivated to exercise despite her pain. Will continue to monitor and progress as able.    Expected Outcomes Through exercise at rehab and home, the patient will decrease shortness of breath with daily activities and feel confident in carrying out an exercise regimen at home Through exercise at rehab and home, the patient will decrease shortness of breath with daily activities and feel confident in carrying out an exercise regimen at home Through exercise at rehab and home, the patient will decrease shortness of breath with daily activities and feel  confident in carrying out an exercise regimen at home Through exercise at rehab and home, the patient will decrease shortness of breath with daily activities and feel confident in carrying out an exercise regimen at home             Discharge Exercise Prescription (Final Exercise Prescription Changes):  Exercise Prescription Changes - 10/28/23 1200       Response to Exercise   Blood Pressure (Admit) 104/70    Blood Pressure (Exercise) 120/60    Blood Pressure (Exit) 96/72    Heart Rate (Admit) 73 bpm    Heart Rate (Exercise) 85 bpm    Heart Rate (Exit) 84 bpm    Oxygen Saturation (Admit) 96 %    Oxygen Saturation (Exercise) 98 %    Oxygen Saturation (Exit) 97 %    Rating of Perceived Exertion (Exercise) 11    Perceived Dyspnea (Exercise) 1    Duration Continue with 30 min of aerobic exercise without signs/symptoms of physical distress.    Intensity THRR unchanged      Progression   Progression Continue to progress workloads to maintain intensity without signs/symptoms of physical distress.      Resistance Training   Training Prescription Yes    Weight blue bands    Reps 10-15    Time 10 Minutes      NuStep   Level 3    SPM 73    Minutes 15    METs 2.1      Track   Laps 12    Minutes 15    METs 2.85             Nutrition:  Target Goals: Understanding of nutrition guidelines, daily intake of sodium 1500mg , cholesterol 200mg , calories 30% from fat and 7% or less from saturated fats, daily to have 5 or more servings of fruits and vegetables.  Biometrics:  Pre Biometrics - 09/17/23 1049       Pre Biometrics   Grip Strength 22 kg  Post Biometrics - 07/29/23 1531        Post  Biometrics   Grip Strength 24 kg             Nutrition Therapy Plan and Nutrition Goals:  Nutrition Therapy & Goals - 10/28/23 1108       Nutrition Therapy   Diet Heart Healthy diet    Drug/Food Interactions Statins/Certain Fruits      Personal  Nutrition Goals   Nutrition Goal Patient to maintain weight while enrolled in pulmonary rehab.   goal in progress.   Personal Goal #2 Patient to improve diet quality by using the plate method as a guide for meal planning to include lean protein/plant protein, fruits, vegetables, whole grains, nonfat dairy as part of a well-balanced diet.   goal in progress.   Comments Goals in progress. 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 She has previously participated in cardiac rehab in 2018. Lipids are at goal. She is up 1.1# since starting with our program. Patient will benefit from participation in pulmonary rehab for nutrition, exercise, and lifestyle modification.      Intervention Plan   Intervention Prescribe, educate and counsel regarding individualized specific dietary modifications aiming towards targeted core components such as weight, hypertension, lipid management, diabetes, heart failure and other comorbidities.;Nutrition handout(s) given to patient.    Expected Outcomes Short Term Goal: Understand basic principles of dietary content, such as calories, fat, sodium, cholesterol and nutrients.;Long Term Goal: Adherence to prescribed nutrition plan.             Nutrition Assessments:  Nutrition Assessments - 07/24/23 1542       MEDFICTS Scores   Post Score 64   Pt didn't return pre-form           MEDIFICTS Score Key: >=70 Need to make dietary changes  40-70 Heart Healthy Diet <= 40 Therapeutic Level Cholesterol Diet  Flowsheet Row PULMONARY REHAB OTHER RESPIRATORY from 07/24/2023 in [facility]  Picture Your Plate Total Score on Discharge 64      Picture Your Plate Scores: <19 Unhealthy dietary pattern with much room for improvement. 41-50 Dietary pattern unlikely to meet recommendations for good  health and room for improvement. 51-60 More healthful dietary pattern, with some room for improvement.  >60 Healthy dietary pattern, although there may be some specific behaviors that could be improved.    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 She has previously participated in cardiac rehab in 2018. Lipids are at goal. Patient will benefit from participation in pulmonary rehab for nutrition, exercise, and lifestyle modification. Goals in progress. 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 She has previously participated in cardiac rehab in 2018. Lipids are at goal. She is up  1.1# since starting with our program. Patient will benefit from participation in pulmonary rehab for nutrition, exercise, and lifestyle modification.               Nutrition Goals Discharge (Final Nutrition Goals Re-Evaluation):  Nutrition Goals Re-Evaluation - 10/28/23 1108       Goals   Current Weight 138 lb 3.7 oz (62.7 kg)    Comment no new labs; most recent labs  Lipids WNL, LDL 54    Expected Outcome Goals in progress. 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 She has previously participated in cardiac rehab in 2018. Lipids are at goal. She is up 1.1# since starting with our program. Patient will benefit from participation in pulmonary rehab for nutrition,  exercise, and lifestyle modification.             Psychosocial: Target Goals: Acknowledge presence or absence of significant depression and/or stress, maximize coping skills, provide positive support system. Participant is able to verbalize types and ability to use techniques and skills needed for reducing stress and depression.  Initial Review & Psychosocial Screening:  Initial Psych Review & Screening - 09/17/23 1107       Initial Review   Current issues with Current Stress Concerns    Source of Stress Concerns Chronic Illness;Unable to participate in former interests or hobbies    Comments Madisin recently had lung surgery. She is dealing with the stress of not being able to breath like she used to. She hopes exercise will help her stress.      Family Dynamics   Good Support System? Yes    Comments son and daughter      Barriers   Psychosocial barriers to participate in program The patient should benefit from training in stress management and relaxation.      Screening Interventions   Interventions Encouraged to exercise    Expected Outcomes Short Term goal: Utilizing psychosocial counselor, staff and physician to assist with identification of specific Stressors or current issues interfering with healing process. Setting desired goal for each stressor or current issue identified.;Long Term Goal: Stressors or current issues are controlled or eliminated.;Short Term goal: Identification and review with participant of any Quality of Life or Depression concerns found by scoring the questionnaire.;Long Term goal: The participant improves quality of Life and PHQ9 Scores as seen by post scores and/or verbalization of changes             Quality of Life Scores:  Scores of 19 and below usually indicate a poorer quality of life in these areas.  A difference of  2-3 points is a clinically meaningful difference.  A difference of 2-3 points in the total score of the Quality of Life Index  has been associated with significant improvement in overall quality of life, self-image, physical symptoms, and general health in studies assessing change in quality of life.  PHQ-9: Review Flowsheet  More data exists      09/17/2023 08/26/2023 07/24/2023 07/07/2023 09/04/2022  Depression screen PHQ 2/9  Decreased Interest 0 0 0 0 0  Down, Depressed, Hopeless 0 0 0 0 0  PHQ - 2 Score 0 0 0 0 0  Altered sleeping 0 1 0 0 -  Tired, decreased energy 2 1 1 1  -  Change in appetite 0 0 0 0 -  Feeling bad or failure about yourself  0 0 0 0 -  Trouble concentrating 0 0 0 0 -  Moving slowly or fidgety/restless 1 0 0 0 -  Suicidal thoughts 0 0 - - -  PHQ-9 Score 3 2 1 1  -  Difficult doing work/chores Somewhat difficult - Not difficult at all Not difficult at all -   Interpretation of Total Score  Total Score Depression Severity:  1-4 = Minimal depression, 5-9 = Mild depression, 10-14 = Moderate depression, 15-19 = Moderately severe depression, 20-27 = Severe depression   Psychosocial Evaluation and Intervention:  Psychosocial Evaluation - 09/17/23 1131       Psychosocial Evaluation & Interventions   Interventions Stress management education;Encouraged to exercise with the program and follow exercise prescription    Comments Mckynzi recently had lung surgery. She is dealing with the stress of not being able to breath like she used to. She hopes exercise will help her stress. Staff will educate Serene on ways tp reduce her stress.    Expected Outcomes For Fern to have less stress during PR    Continue Psychosocial Services  No Follow up required             Psychosocial Re-Evaluation:  Psychosocial Re-Evaluation     Row Name 07/08/23 0925 07/30/23 7829 09/24/23 1344 10/22/23 1128       Psychosocial Re-Evaluation   Current issues with None Identified None Identified Current Stress Concerns Current Stress Concerns    Comments No changes since orientation. Monnie is scheduled to start  the program next week. Walida graduated on 07/29/23 from the PR program. No new psychosocial barriers or concerns at the time of graduation. Malasha has attended one class so far. She is still dealing with pain from her surgery which causes her to feel stressed. No new stressors or concerns at this time. Remell is doing great in the program. She continues to have pain from her surgery, but states she feels it is getting better. She denies any new stressors at this time.    Expected Outcomes For Kaysha to participate in PR before her lung surgery, for pt to not have any barriers or concerns For Ailee to apply what she learned in PR class For Jazmen to participate in PR with les pain and stress For Kamesha to participate in PR with les pain and stress    Interventions Encouraged to attend Pulmonary Rehabilitation for the exercise -- -- Encouraged to attend Pulmonary Rehabilitation for the exercise    Continue Psychosocial Services  No Follow up required -- No Follow up required No Follow up required             Psychosocial Discharge (Final Psychosocial Re-Evaluation):  Psychosocial Re-Evaluation - 10/22/23 1128       Psychosocial Re-Evaluation   Current issues with Current Stress Concerns    Comments Royetta is doing great in the program. She continues to have pain from her surgery, but states she feels it is getting better. She denies any new stressors at this time.    Expected Outcomes For Tajia to participate in PR with les pain and stress    Interventions Encouraged to attend Pulmonary Rehabilitation for the exercise    Continue Psychosocial Services  No Follow up required             Education: Education Goals: Education classes will be provided on a weekly basis, covering required topics. Participant will state understanding/return demonstration of topics presented.  Learning Barriers/Preferences:  Learning Barriers/Preferences - 09/17/23 1108       Learning  Barriers/Preferences   Learning Barriers Hearing;Reading    Learning Preferences Written Material;Skilled Demonstration             Education Topics: Know Your Numbers Group instruction that is supported by a PowerPoint presentation. Instructor discusses importance of knowing and understanding resting, exercise, and post-exercise oxygen saturation, heart rate, and blood pressure. Oxygen saturation, heart rate, blood pressure, rating of perceived exertion, and dyspnea are reviewed along with a normal range for these values.    Exercise for the Pulmonary Patient Group instruction that is supported by a PowerPoint presentation. Instructor discusses benefits of exercise, core components of exercise, frequency, duration, and intensity of an exercise routine, importance of utilizing pulse oximetry during exercise, safety while exercising, and options of places to exercise outside of rehab.    MET Level  Group instruction provided by PowerPoint, verbal discussion, and written material to support subject matter. Instructor reviews what METs are and how to increase METs.    Pulmonary Medications Verbally interactive group education provided by instructor with focus on inhaled medications and proper administration.   Anatomy and Physiology of the Respiratory System Group instruction provided by PowerPoint, verbal discussion, and written material to support subject matter. Instructor reviews respiratory cycle and anatomical components of the respiratory system and their functions. Instructor also reviews differences in obstructive and restrictive respiratory diseases with examples of each.  Flowsheet Row PULMONARY REHAB OTHER RESPIRATORY from 07/24/2023 in [facility]  Date 07/24/23  Educator RT  Instruction Review Code 1- Verbalizes Understanding       Oxygen Safety Group instruction provided by PowerPoint, verbal discussion, and  written material to support subject matter. There is an overview of "What is Oxygen" and "Why do we need it".  Instructor also reviews how to create a safe environment for oxygen use, the importance of using oxygen as prescribed, and the risks of noncompliance. There is a brief discussion on traveling with oxygen and resources the patient may utilize.   Oxygen Use Group instruction provided by PowerPoint, verbal discussion, and written material to discuss how supplemental oxygen is prescribed and different types of oxygen supply systems. Resources for more information are provided.    Breathing Techniques Group instruction that is supported by demonstration and informational handouts. Instructor discusses the benefits of pursed lip and diaphragmatic breathing and detailed demonstration on how to perform both.     Risk Factor Reduction Group instruction that is supported by a PowerPoint presentation. Instructor discusses the definition of a risk factor, different risk factors for pulmonary disease, and how the heart and lungs work together.   Pulmonary Diseases Group instruction provided by PowerPoint, verbal discussion, and written material to support subject matter. Instructor gives an overview of the different type of pulmonary diseases. There is also a discussion on risk factors and symptoms as well as ways to manage the diseases. Flowsheet Row PULMONARY REHAB OTHER RESPIRATORY from 10/16/2023 in [facility]  Date 10/16/23  Educator RT  Instruction Review Code 1- Verbalizes Understanding       Stress and Energy Conservation Group instruction provided by PowerPoint, verbal discussion, and written material to support subject matter. Instructor gives an overview of stress and the impact it can have on the body. Instructor also reviews ways to reduce stress. There is also a discussion on energy conservation and ways to conserve energy  throughout the day.   Warning Signs and Symptoms Group  instruction provided by PowerPoint, verbal discussion, and written material to support subject matter. Instructor reviews warning signs and symptoms of stroke, heart attack, cold and flu. Instructor also reviews ways to prevent the spread of infection. Flowsheet Row PULMONARY REHAB OTHER RESPIRATORY from 09/25/2023 in [facility]  Date 09/25/23  Educator RN  Instruction Review Code 1- Verbalizes Understanding       Other Education Group or individual verbal, written, or video instructions that support the educational goals of the pulmonary rehab program.    Knowledge Questionnaire Score:  Knowledge Questionnaire Score - 09/17/23 1142       Knowledge Questionnaire Score   Pre Score 16/18             Core Components/Risk Factors/Patient Goals at Admission:  Personal Goals and Risk Factors at Admission - 09/17/23 1108       Core Components/Risk Factors/Patient Goals on Admission    Weight Management Yes;Weight Maintenance    Intervention Weight Management: Develop a combined nutrition and exercise program designed to reach desired caloric intake, while maintaining appropriate intake of nutrient and fiber, sodium and fats, and appropriate energy expenditure required for the weight goal.;Weight Management: Provide education and appropriate resources to help participant work on and attain dietary goals.;Weight Management/Obesity: Establish reasonable short term and long term weight goals.;Obesity: Provide education and appropriate resources to help participant work on and attain dietary goals.    Admit Weight 137 lb 2 oz (62.2 kg)    Expected Outcomes Short Term: Continue to assess and modify interventions until short term weight is achieved;Long Term: Adherence to nutrition and physical activity/exercise program aimed toward attainment of established weight goal;Weight  Maintenance: Understanding of the daily nutrition guidelines, which includes 25-35% calories from fat, 7% or less cal from saturated fats, less than 200mg  cholesterol, less than 1.5gm of sodium, & 5 or more servings of fruits and vegetables daily;Understanding recommendations for meals to include 15-35% energy as protein, 25-35% energy from fat, 35-60% energy from carbohydrates, less than 200mg  of dietary cholesterol, 20-35 gm of total fiber daily;Understanding of distribution of calorie intake throughout the day with the consumption of 4-5 meals/snacks    Improve shortness of breath with ADL's Yes    Intervention Provide education, individualized exercise plan and daily activity instruction to help decrease symptoms of SOB with activities of daily living.    Expected Outcomes Short Term: Improve cardiorespiratory fitness to achieve a reduction of symptoms when performing ADLs;Long Term: Be able to perform more ADLs without symptoms or delay the onset of symptoms             Core Components/Risk Factors/Patient Goals Review:   Goals and Risk Factor Review     Row Name 07/08/23 0932 07/30/23 0818 09/24/23 1347 10/22/23 1130       Core Components/Risk Factors/Patient Goals Review   Personal Goals Review Improve shortness of breath with ADL's;Develop more efficient breathing techniques such as purse lipped breathing and diaphragmatic breathing and practicing self-pacing with activity. -- Weight Management/Obesity;Improve shortness of breath with ADL's;Develop more efficient breathing techniques such as purse lipped breathing and diaphragmatic breathing and practicing self-pacing with activity. Weight Management/Obesity;Improve shortness of breath with ADL's;Develop more efficient breathing techniques such as purse lipped breathing and diaphragmatic breathing and practicing self-pacing with activity.    Review Unable to assess, Aliyyah is scheduled to start the program today Leilanni graduated from the  PR program on 07/29/23. She only attended 6 sessions due  to upcoming surgery. Goal not met for improving shortness of breath with ADL's. Goal met for developing more efficient breathing techniques such as purse lipped breathing and diaphragmatic breathing; and practicing self-pacing with activity. Deoborah was able to demonstrate purse lip breathing when exercising. She was also able to pace herself when she would get SOB. Monthly review of patient's Core Components/Risk Factors/Patient Goals are as follows: Goal progressing for improving shortness of breath with ADL's. Yuridia is currently exercising on RA to maintain sats >88%. She is currently exercising on the track and the Nustep. Goal progressing for developing more efficient breathing techniques such as purse lipped breathing and diaphragmatic breathing; and practicing self-pacing with activity. Goal progressing for maintaining weight. Isebella is working with staff dietitician on ways to maintain her weight while in the program. Monthly review of patient's Core Components/Risk Factors/Patient Goals are as follows: Goal progressing for improving shortness of breath with ADL's. Keeley is currently exercising on RA to maintain sats >88%. She is currently exercising on the track and the Nustep. Goal progressing for developing more efficient breathing techniques such as purse lipped breathing and diaphragmatic breathing; and practicing self-pacing with activity. Goal progressing for maintaining weight. We will continue to monitor her progress throughout the program.    Expected Outcomes For Amyrah to improve her shortness of breath with ADLs and develop more efficient breathing techniques such as purse lipped breathing and diaphragmatic breathing; and practicing self-pacing with activity Patient will continue to benefit from ongoing nutrition, exercise, and lifestyle modification To improve shortness of breath with ADL's, develop more efficient breathing  techniques such as purse lipped breathing and diaphragmatic breathing; and practicing self-pacing with activity and maintain her weight. To improve shortness of breath with ADL's, develop more efficient breathing techniques such as purse lipped breathing and diaphragmatic breathing; and practicing self-pacing with activity and maintain her weight.             Core Components/Risk Factors/Patient Goals at Discharge (Final Review):   Goals and Risk Factor Review - 10/22/23 1130       Core Components/Risk Factors/Patient Goals Review   Personal Goals Review Weight Management/Obesity;Improve shortness of breath with ADL's;Develop more efficient breathing techniques such as purse lipped breathing and diaphragmatic breathing and practicing self-pacing with activity.    Review Monthly review of patient's Core Components/Risk Factors/Patient Goals are as follows: Goal progressing for improving shortness of breath with ADL's. Rozina is currently exercising on RA to maintain sats >88%. She is currently exercising on the track and the Nustep. Goal progressing for developing more efficient breathing techniques such as purse lipped breathing and diaphragmatic breathing; and practicing self-pacing with activity. Goal progressing for maintaining weight. We will continue to monitor her progress throughout the program.    Expected Outcomes To improve shortness of breath with ADL's, develop more efficient breathing techniques such as purse lipped breathing and diaphragmatic breathing; and practicing self-pacing with activity and maintain her weight.             ITP Comments:Pt is making expected progress toward Pulmonary Rehab goals after completing 9 session(s). Recommend continued exercise, life style modification, education, and utilization of breathing techniques to increase stamina and strength, while also decreasing shortness of breath with exertion.  Dr. Genetta Kenning is Medical Director for Pulmonary  Rehab at [facility].

## 2023-10-30 ENCOUNTER — Encounter (HOSPITAL_COMMUNITY)

## 2023-10-30 NOTE — Progress Notes (Signed)
 I attempted to reach the pt to follow up on a discussion from 6/9. The pt has her follow up appt with C.Heilingoetter, Onc PA on 6/18, however pt has yet to schedule her CT scan. Pt did not answer mobile/home number, I left a VM requesting she call me back with an update.

## 2023-11-03 ENCOUNTER — Telehealth: Payer: Self-pay | Admitting: Physician Assistant

## 2023-11-03 NOTE — Telephone Encounter (Signed)
 Patient stated she wanted to cancel appointment due to waiting for release of care from [facility]. She will call back to reschedule at a later time.

## 2023-11-04 ENCOUNTER — Encounter (HOSPITAL_COMMUNITY)
Admission: RE | Admit: 2023-11-04 | Discharge: 2023-11-04 | Disposition: A | Discharge status: Home / Self Care | Source: Ambulatory Visit | Attending: Pulmonary Disease | Admitting: Pulmonary Disease

## 2023-11-04 DIAGNOSIS — J449 Chronic obstructive pulmonary disease, unspecified: Secondary | ICD-10-CM | POA: Diagnosis not present

## 2023-11-04 NOTE — Progress Notes (Signed)
 Daily Session Note  Patient Details  Name: Joy Williams MRN: [id number] Date of Birth: 1951/05/12 Referring Provider:   Gattis Kass Pulmonary Rehab Walk Test from 09/17/2023 in [facility], V[facility], & [facility]  Referring Provider D'amico    Encounter Date: 11/04/2023  Check In:  Session Check In - 11/04/23 1051       Check-In   Supervising physician immediately available to respond to emergencies [facility] MD immediately available    Physician(s) Morey Ar, NP    Location [facility]    Staff Present Atlas Lea, MS, ACSM-CEP, Exercise Physiologist;Terrence Pizana Rochelle Chu, ACSM-CEP, Exercise Physiologist;Casey Carmen Chol, RN, BSN    Virtual Visit No    Medication changes reported     No    Fall or balance concerns reported    No    Tobacco Cessation No Change    Warm-up and Cool-down Performed as group-led instruction    Resistance Training Performed Yes    VAD Patient? No    PAD/SET Patient? No      Pain Assessment   Currently in Pain? No/denies          Capillary Blood Glucose: No results found for this or any previous visit (from the past 24 hours).    Social History   Tobacco Use  Smoking Status Former   Current packs/day: 0.00   Average packs/day: 1 pack/day for 40.1 years (40.1 ttl pk-yrs)   Types: Cigarettes   Start date: 05/21/1983   Quit date: 06/12/2023   Years since quitting: 0.3   Passive exposure: Never  Smokeless Tobacco Never  Tobacco Comments   Quit 06/12/23    Goals Met:  Independence with exercise equipment Exercise tolerated well No report of concerns or symptoms today Strength training completed today  Goals Unmet:  Not Applicable  Comments: Service time is from 1012 to 1140.    Dr. Genetta Kenning is Medical Director for Pulmonary Rehab at [facility].

## 2023-11-05 ENCOUNTER — Inpatient Hospital Stay: Payer: No Typology Code available for payment source | Admitting: Physician Assistant

## 2023-11-06 ENCOUNTER — Encounter (HOSPITAL_COMMUNITY): Admission: RE | Admit: 2023-11-06 | Source: Ambulatory Visit

## 2023-11-11 ENCOUNTER — Encounter (HOSPITAL_COMMUNITY)
Admission: RE | Admit: 2023-11-11 | Discharge: 2023-11-11 | Disposition: A | Discharge status: Home / Self Care | Source: Ambulatory Visit | Attending: Pulmonary Disease

## 2023-11-11 VITALS — Wt 140.0 lb

## 2023-11-11 DIAGNOSIS — J449 Chronic obstructive pulmonary disease, unspecified: Secondary | ICD-10-CM

## 2023-11-11 NOTE — Progress Notes (Signed)
 Daily Session Note  Patient Details  Name: Joy Williams MRN: [id number] Date of Birth: 06/27/1950 Referring Provider:   Conrad Ports Pulmonary Rehab Walk Test from 09/17/2023 in [facility] for Heart, Vascular, & Lung Health  Referring Provider D'amico    Encounter Date: 11/11/2023  Check In:  Session Check In - 11/11/23 1034       Check-In   Supervising physician immediately available to respond to emergencies [facility] MD immediately available    Physician(s) Josefa Beauvais, NP    [facility]    Staff Present Johnnie Moats, MS, ACSM-CEP, Exercise Physiologist;Shaheen Star Midge HECKLE, ACSM-CEP, Exercise Physiologist;Casey Claudene Candia Levin, RN, BSN    Virtual Visit No    Medication changes reported     No    Fall or balance concerns reported    No    Tobacco Cessation No Change    Warm-up and Cool-down Performed as group-led instruction    Resistance Training Performed Yes    VAD Patient? No    PAD/SET Patient? No      Pain Assessment   Currently in Pain? No/denies    Pain Score 0-No pain    Multiple Pain Sites No          Capillary Blood Glucose: No results found for this or any previous visit (from the past 24 hours).   Exercise Prescription Changes - 11/11/23 1200       Response to Exercise   Blood Pressure (Admit) 114/68    Blood Pressure (Exercise) 130/70    Blood Pressure (Exit) 106/60    Heart Rate (Admit) 71 bpm    Heart Rate (Exercise) 93 bpm    Heart Rate (Exit) 64 bpm    Oxygen Saturation (Admit) 98 %    Oxygen Saturation (Exercise) 95 %    Oxygen Saturation (Exit) 95 %    Rating of Perceived Exertion (Exercise) 11    Perceived Dyspnea (Exercise) 1    Duration Continue with 30 min of aerobic exercise without signs/symptoms of physical distress.    Intensity THRR unchanged      Progression   Progression Continue to progress workloads to maintain intensity without signs/symptoms of physical distress.       Resistance Training   Training Prescription Yes    Weight blue bands    Reps 10-15    Time 10 Minutes      Treadmill   MPH 1.8    Grade 1    Minutes 15    METs 2.5      NuStep   Level 3    SPM 79    Minutes 15    METs 2.1          Social History   Tobacco Use  Smoking Status Former   Current packs/day: 0.00   Average packs/day: 1 pack/day for 40.1 years (40.1 ttl pk-yrs)   Types: Cigarettes   Start date: 05/21/1983   Quit date: 06/12/2023   Years since quitting: 0.4   Passive exposure: Never  Smokeless Tobacco Never  Tobacco Comments   Quit 06/12/23    Goals Met:  Independence with exercise equipment Exercise tolerated well No report of concerns or symptoms today Strength training completed today  Goals Unmet:  Not Applicable  Comments: Service time is from 1011 to 1140.    Dr. Slater Staff is Medical Director for Pulmonary Rehab at [facility].

## 2023-11-13 ENCOUNTER — Encounter (HOSPITAL_COMMUNITY)
Admission: RE | Admit: 2023-11-13 | Discharge: 2023-11-13 | Disposition: A | Discharge status: Home / Self Care | Source: Ambulatory Visit | Attending: Pulmonary Disease | Admitting: Pulmonary Disease

## 2023-11-13 DIAGNOSIS — J449 Chronic obstructive pulmonary disease, unspecified: Secondary | ICD-10-CM | POA: Diagnosis not present

## 2023-11-13 NOTE — Progress Notes (Signed)
 Daily Session Note  Patient Details  Name: Joy Williams MRN: [id number] Date of Birth: 10/19/1950 Referring Provider:   Conrad Ports Pulmonary Rehab Walk Test from 09/17/2023 in [facility] Heart, Vascular, & Lung Health  Referring Provider D'amico    Encounter Date: 11/13/2023  Check In:  Session Check In - 11/13/23 1035       Check-In   Supervising physician immediately available to respond to emergencies [facility] MD immediately available    Physician(s) Rosabel Mose, NP    [facility]    Staff Present Johnnie Moats, MS, ACSM-CEP, Exercise Physiologist;Randi Midge HECKLE, ACSM-CEP, Exercise Physiologist;Casey Claudene Candia Levin, RN, BSN    Virtual Visit No    Medication changes reported     No    Fall or balance concerns reported    No    Tobacco Cessation No Change    Warm-up and Cool-down Performed as group-led instruction    Resistance Training Performed Yes    VAD Patient? No    PAD/SET Patient? No      Pain Assessment   Currently in Pain? No/denies          Capillary Blood Glucose: No results found for this or any previous visit (from the past 24 hours).    Social History   Tobacco Use  Smoking Status Former   Current packs/day: 0.00   Average packs/day: 1 pack/day for 40.1 years (40.1 ttl pk-yrs)   Types: Cigarettes   Start date: 05/21/1983   Quit date: 06/12/2023   Years since quitting: 0.4   Passive exposure: Never  Smokeless Tobacco Never  Tobacco Comments   Quit 06/12/23    Goals Met:  Exercise tolerated well No report of concerns or symptoms today Strength training completed today  Goals Unmet:  Not Applicable  Comments: Service time is from 1009 to 1145    Dr. Slater Staff is Medical Director for Pulmonary Rehab at [facility].

## 2023-11-14 ENCOUNTER — Encounter: Payer: Self-pay | Admitting: Internal Medicine

## 2023-11-14 NOTE — Telephone Encounter (Signed)
 Naproxen has some antiplatelet effects, so when taken with clopidogrel  we may see an increase in bleeding or bruising.  As long as patient is aware to monitor for signs of bleeding and reach out to MD or go to ER with any injuries/accidents that might cause bleed, she should be okay.  Is this going to be a long term pain issue, or should it improve with time and she would be able to come off the naproxen?

## 2023-11-14 NOTE — Telephone Encounter (Signed)
 Called pt regarding medication concerns. She had a Left Lung Lobectomy at [facility] in March 2025 and is on pain management regimen per providers at [facility].   Recently, Ibuprofen was stopped and Naproxen 220 mg BID was prescribed. She uses the Naproxen for break thru pain, she states. They would like to increase the Naproxen to 440 mg BID, but the Triage nurse at [facility] is concerned about this due to her being on Plavix  and this is why she is contacting Cardiology. She is also on Gabapentin  300 mg BID and Tylenol  1,000 mg BID. She is not using Morphine  and she has not used Lidocaine  patch in several weeks.

## 2023-11-18 ENCOUNTER — Encounter (HOSPITAL_COMMUNITY)
Admission: RE | Admit: 2023-11-18 | Discharge: 2023-11-18 | Disposition: A | Discharge status: Home / Self Care | Source: Ambulatory Visit | Attending: Pulmonary Disease | Admitting: Pulmonary Disease

## 2023-11-18 ENCOUNTER — Encounter: Payer: Self-pay | Admitting: *Deleted

## 2023-11-18 ENCOUNTER — Other Ambulatory Visit (HOSPITAL_COMMUNITY): Payer: Self-pay

## 2023-11-18 VITALS — Wt 142.6 lb

## 2023-11-18 DIAGNOSIS — J449 Chronic obstructive pulmonary disease, unspecified: Secondary | ICD-10-CM | POA: Diagnosis not present

## 2023-11-18 NOTE — Telephone Encounter (Signed)
 Since the Trelegy is a triple therapy medication, are there specific medications you would like test claims for? Additionally, the higher co-pay could be in response to a deductible that may need to be met. At the moment, I am unable to check on this as it is currently filled at her pharmacy for pick up.

## 2023-11-18 NOTE — Telephone Encounter (Signed)
 Called and spoke to pt. She has been taking the pain medications since March/April 2025, so for longer than three months now. She was on Ibuprofen initially, but has found that Naproxen relieves her pain much better. She states that per the doctors at [facility], this will be a long term pain issue going forward.   The pharmacist's advice/recommendations were given to the patient, who verbalized understanding. Advised her to log in to her MyChart and respond/ask questions and we would forward it to the Pharmacist Karey Mink).   From a cardiology standpoint patient feels she is doing well and has no concerns. Will follow up (1 yr f/u) with Dr. Loni on 02/20/2024. No other concerns.

## 2023-11-18 NOTE — Progress Notes (Signed)
 Daily Session Note  Patient Details  Name: Joy Williams MRN: [id number] Date of Birth: 12/29/1950 Referring Provider:   Conrad Ports Pulmonary Rehab Walk Test from 09/17/2023 in [facility] for Heart, Vascular, & Lung Health  Referring Provider D'amico    Encounter Date: 11/18/2023  Check In:  Session Check In - 11/18/23 1052       Check-In   Supervising physician immediately available to respond to emergencies [facility] MD immediately available    Physician(s) Jackee Alberts, NP    [facility]    Staff Present Johnnie Moats, MS, ACSM-CEP, Exercise Physiologist;Randi Midge HECKLE, ACSM-CEP, Exercise Physiologist;Casey Claudene Candia Levin, RN, BSN    Virtual Visit No    Medication changes reported     No    Fall or balance concerns reported    No    Tobacco Cessation No Change    Warm-up and Cool-down Performed as group-led instruction    Resistance Training Performed Yes    VAD Patient? No    PAD/SET Patient? No      Pain Assessment   Currently in Pain? No/denies    Multiple Pain Sites No          Capillary Blood Glucose: No results found for this or any previous visit (from the past 24 hours).    Social History   Tobacco Use  Smoking Status Former   Current packs/day: 0.00   Average packs/day: 1 pack/day for 40.1 years (40.1 ttl pk-yrs)   Types: Cigarettes   Start date: 05/21/1983   Quit date: 06/12/2023   Years since quitting: 0.4   Passive exposure: Never  Smokeless Tobacco Never  Tobacco Comments   Quit 06/12/23    Goals Met:  Independence with exercise equipment Exercise tolerated well No report of concerns or symptoms today Strength training completed today  Goals Unmet:  Not Applicable  Comments: Service time is from 1010 to 1135    Dr. Slater Staff is Medical Director for Pulmonary Rehab at [facility].

## 2023-11-19 ENCOUNTER — Telehealth: Payer: Self-pay | Admitting: Internal Medicine

## 2023-11-19 NOTE — Telephone Encounter (Signed)
Made appt. NFN

## 2023-11-19 NOTE — Telephone Encounter (Signed)
 Joy Williams Not seen me in a while. WEnt to [facility] for lung cancer surgery  Plan  Give first avail with spiro and dlco x 30 min      Latest Ref Rng & Units 02/18/2023    9:55 AM 11/19/2022    1:11 PM 03/18/2022    1:47 PM 02/15/2021    2:49 PM 08/05/2019   10:55 AM 03/12/2016   10:26 AM 11/14/2014   11:05 AM  PFT Results  FVC-Pre L 2.26  2.33  2.48  2.35  2.52  2.67  2.97   FVC-Predicted Pre % 71  72  77  71  74  76  84   FVC-Post L    2.41  2.52  2.61  3.00   FVC-Predicted Post %    72  74  75  85   Pre FEV1/FVC % % 80  78  81  78  81  78  78   Post FEV1/FCV % %    81  82  80  78   FEV1-Pre L 1.81  1.82  2.00  1.84  2.04  2.09  2.33   FEV1-Predicted Pre % 75  75  82  73  79  78  86   FEV1-Post L    1.96  2.06  2.09  2.33   DLCO uncorrected ml/min/mmHg 11.76  11.03  13.70  12.17  13.77  13.52  14.61   DLCO UNC% % 56  53  66  57  64  48  52   DLCO corrected ml/min/mmHg 11.95  11.03  13.70  12.17  13.77  14.73    DLCO COR %Predicted % 57  53  66  57  64  53    DLVA Predicted % 81  82  83  86  87  84  65   TLC L    3.83  4.22  4.18  4.88   TLC % Predicted %    70  77  77  89   RV % Predicted %    64  74  71  88

## 2023-11-19 NOTE — Telephone Encounter (Signed)
 Hello- Would you kindly check again on this medication and adv Sonny to call PT. Pt states has not heard back from us  about this. Thanks.

## 2023-11-20 ENCOUNTER — Encounter (HOSPITAL_COMMUNITY)
Admission: RE | Admit: 2023-11-20 | Discharge: 2023-11-20 | Disposition: A | Discharge status: Home / Self Care | Source: Ambulatory Visit | Attending: Pulmonary Disease | Admitting: Pulmonary Disease

## 2023-11-20 DIAGNOSIS — J449 Chronic obstructive pulmonary disease, unspecified: Secondary | ICD-10-CM

## 2023-11-20 NOTE — Progress Notes (Signed)
 Daily Session Note  Patient Details  Name: Joy Williams MRN: [id number] Date of Birth: Oct 18, 1950 Referring Provider:   Conrad Ports Pulmonary Rehab Walk Test from 09/17/2023 in [facility] for Heart, Vascular, & Lung Health  Referring Provider D'amico    Encounter Date: 11/20/2023  Check In:  Session Check In - 11/20/23 1035       Check-In   Supervising physician immediately available to respond to emergencies [facility] MD immediately available    Physician(s) Lum Louis, NP    Location [facility]    Staff Present Johnnie Moats, MS, ACSM-CEP, Exercise Physiologist;Randi Midge HECKLE, ACSM-CEP, Exercise Physiologist;Mary Harvy, RN, BSN;Other    Virtual Visit No    Medication changes reported     No    Fall or balance concerns reported    No    Tobacco Cessation No Change    Warm-up and Cool-down Performed as group-led instruction    Resistance Training Performed Yes    VAD Patient? No    PAD/SET Patient? No      Pain Assessment   Currently in Pain? No/denies    Pain Score 0-No pain    Multiple Pain Sites No          Capillary Blood Glucose: No results found for this or any previous visit (from the past 24 hours).    Social History   Tobacco Use  Smoking Status Former   Current packs/day: 0.00   Average packs/day: 1 pack/day for 40.1 years (40.1 ttl pk-yrs)   Types: Cigarettes   Start date: 05/21/1983   Quit date: 06/12/2023   Years since quitting: 0.4   Passive exposure: Never  Smokeless Tobacco Never  Tobacco Comments   Quit 06/12/23    Goals Met:  Proper associated with RPD/PD & O2 Sat Exercise tolerated well No report of concerns or symptoms today Strength training completed today  Goals Unmet:  Not Applicable  Comments: Service time is from 1008 to 1144.    Dr. Slater Staff is Medical Director for Pulmonary Rehab at [facility].

## 2023-11-21 ENCOUNTER — Other Ambulatory Visit: Payer: Self-pay

## 2023-11-21 ENCOUNTER — Emergency Department (HOSPITAL_COMMUNITY)

## 2023-11-21 ENCOUNTER — Emergency Department (HOSPITAL_COMMUNITY)
Admission: EM | Admit: 2023-11-21 | Discharge: 2023-11-21 | Disposition: A | Discharge status: Home / Self Care | Attending: Emergency Medicine | Admitting: Emergency Medicine

## 2023-11-21 ENCOUNTER — Encounter (HOSPITAL_COMMUNITY): Payer: Self-pay

## 2023-11-21 DIAGNOSIS — W19XXXA Unspecified fall, initial encounter: Secondary | ICD-10-CM | POA: Diagnosis not present

## 2023-11-21 DIAGNOSIS — S8002XA Contusion of left knee, initial encounter: Secondary | ICD-10-CM

## 2023-11-21 DIAGNOSIS — M25562 Pain in left knee: Secondary | ICD-10-CM | POA: Diagnosis not present

## 2023-11-21 DIAGNOSIS — S8700XA Crushing injury of unspecified knee, initial encounter: Secondary | ICD-10-CM | POA: Diagnosis not present

## 2023-11-21 DIAGNOSIS — M25569 Pain in unspecified knee: Secondary | ICD-10-CM | POA: Diagnosis not present

## 2023-11-21 DIAGNOSIS — I509 Heart failure, unspecified: Secondary | ICD-10-CM | POA: Insufficient documentation

## 2023-11-21 DIAGNOSIS — S8992XA Unspecified injury of left lower leg, initial encounter: Secondary | ICD-10-CM | POA: Diagnosis present

## 2023-11-21 DIAGNOSIS — W108XXA Fall (on) (from) other stairs and steps, initial encounter: Secondary | ICD-10-CM | POA: Insufficient documentation

## 2023-11-21 DIAGNOSIS — S81012A Laceration without foreign body, left knee, initial encounter: Secondary | ICD-10-CM | POA: Insufficient documentation

## 2023-11-21 DIAGNOSIS — Z7902 Long term (current) use of antithrombotics/antiplatelets: Secondary | ICD-10-CM | POA: Insufficient documentation

## 2023-11-21 DIAGNOSIS — T797XXA Traumatic subcutaneous emphysema, initial encounter: Secondary | ICD-10-CM | POA: Diagnosis not present

## 2023-11-21 DIAGNOSIS — Z85118 Personal history of other malignant neoplasm of bronchus and lung: Secondary | ICD-10-CM | POA: Insufficient documentation

## 2023-11-21 MED ORDER — OXYCODONE HCL 5 MG PO TABS: 5.0000 mg | ORAL_TABLET | ORAL | 0 refills | Status: DC | PRN

## 2023-11-21 MED ORDER — LIDOCAINE-EPINEPHRINE 2 %-1:100000 IJ SOLN
20.0000 mL | Freq: Once | INTRAMUSCULAR | Status: AC
  Administered 2023-11-21: 20 mL
  Filled 2023-11-21: qty 1

## 2023-11-21 MED ORDER — LIDOCAINE-EPINEPHRINE-TETRACAINE (LET) TOPICAL GEL
3.0000 mL | Freq: Once | TOPICAL | Status: AC
  Administered 2023-11-21: 3 mL via TOPICAL
  Filled 2023-11-21: qty 3

## 2023-11-21 MED ORDER — OXYCODONE-ACETAMINOPHEN 5-325 MG PO TABS
1.0000 | ORAL_TABLET | Freq: Once | ORAL | Status: AC
  Administered 2023-11-21: 1 via ORAL
  Filled 2023-11-21: qty 1

## 2023-11-21 MED ORDER — CEPHALEXIN 500 MG PO CAPS: 500.0000 mg | ORAL_CAPSULE | Freq: Four times a day (QID) | ORAL | 0 refills | Status: DC

## 2023-11-21 MED ORDER — ONDANSETRON 4 MG PO TBDP
4.0000 mg | ORAL_TABLET | Freq: Once | ORAL | Status: AC
  Administered 2023-11-21: 4 mg via ORAL
  Filled 2023-11-21: qty 1

## 2023-11-21 NOTE — ED Triage Notes (Addendum)
 Patient BIB [facility] from the pool. Fell on the stairs (last step) and has left knee pain. No LOC. Did not hit her head. Cannot bear weight on leg. Laceration across left knee cap. Takes plavix . Unknown when last tetanus was.

## 2023-11-21 NOTE — ED Provider Notes (Signed)
 [place] EMERGENCY DEPARTMENT AT [facility] Provider Note   CSN: [id number] Arrival date & time: 11/21/23  1316     Patient presents with: Joy Williams is a 73 y.o. female.  {Add pertinent medical, surgical, social history, OB history to [id number]}  Fall       Prior to Admission medications   Medication Sig Start Date End Date Taking? Authorizing Provider  acetaminophen  (TYLENOL ) 500 MG tablet Take 1,000 mg by mouth every 6 (six) hours as needed for mild pain (pain score 1-3) or headache.    [provider]  Cholecalciferol (VITAMIN D ) 125 MCG (5000 UT) CAPS Take 5,000 Units by mouth daily.    [provider]  clopidogrel  (PLAVIX ) 75 MG tablet Take 1 tablet (75 mg total) by mouth daily. 04/08/23   Acharya, Gayatri A, MD  EPINEPHrine  0.3 mg/0.3 mL IJ SOAJ injection Inject 0.3 mg into the muscle as needed. 08/26/23   Chandra Toribio POUR, MD  ferrous sulfate  325 (65 FE) MG tablet Take 1 tablet (325 mg total) by mouth daily. Patient taking differently: Take 325 mg by mouth daily. OTC 02/11/23   Acharya, Gayatri A, MD  fluticasone  (FLONASE ) 50 MCG/ACT nasal spray Place 1 spray into both nostrils daily.    [provider]  Fluticasone -Umeclidin-Vilant (TRELEGY ELLIPTA ) 100-62.5-25 MCG/ACT AEPB Inhale 1 puff into the lungs daily.    [provider]  gabapentin  (NEURONTIN ) 300 MG capsule Take 1 capsule (300 mg total) by mouth 2 (two) times daily. 09/16/23   Chandra Toribio POUR, MD  hydrALAZINE  (APRESOLINE ) 25 MG tablet Take 1 tablet (25 mg total) by mouth daily as needed (for systolic BP>150). 06/19/22   Pemberton, Heather E, MD  ibuprofen (ADVIL) 200 MG tablet Take 400 mg by mouth every 6 (six) hours as needed for moderate pain (pain score 4-6).    [provider]  lactulose  (CHRONULAC ) 10 GM/15ML solution Take 45 mLs (30 g total) by mouth 2 (two) times daily as needed for moderate constipation or severe constipation. 08/19/23    Gherghe, Costin M, MD  levothyroxine  (SYNTHROID ) 150 MCG tablet Take 1 tablet (150 mcg total) by mouth daily. 03/04/23   Wallace Joesph LABOR, PA  lidocaine  (LIDODERM ) 5 % Place 1 patch onto the skin daily. Remove & Discard patch within 12 hours or as directed by MD 08/20/23   Gherghe, Costin M, MD  MELATONIN PO Take 1 tablet by mouth at bedtime as needed (sleep).    [provider]  methocarbamol  (ROBAXIN ) 500 MG tablet Take 1 tablet (500 mg total) by mouth every 6 (six) hours as needed for muscle spasms. 09/17/23   Chandra Toribio POUR, MD  morphine  (MSIR) 15 MG tablet Take 0.5-1 tablets (7.5-15 mg total) by mouth every 6 (six) hours as needed (breakthrough pain). 10/09/23   Chandra Toribio POUR, MD  Multiple Vitamins-Minerals (MULTIVITAMIN WITH MINERALS) tablet Take 1 tablet by mouth daily.    [provider]  nitroGLYCERIN  (NITROSTAT ) 0.4 MG SL tablet Place 1 tablet (0.4 mg total) under the tongue every 5 (five) minutes as needed. 08/26/23   Chandra Toribio POUR, MD  rosuvastatin  (CRESTOR ) 20 MG tablet Take 1 tablet (20 mg total) by mouth every evening. 11/18/22   Hobart Powell BRAVO, MD  sennosides-docusate sodium  (SENOKOT-S) 8.6-50 MG tablet Take 1 tablet by mouth daily.    [provider]  TRELEGY ELLIPTA  100-62.5-25 MCG/ACT AEPB INHALE 1 PUFF BY MOUTH EVERY DAY 10/13/23   Geronimo Amel, MD  Turmeric 500 MG CAPS Take 500 mg by mouth daily.    [provider]  vitamin B-12 (CYANOCOBALAMIN ) 1000 MCG tablet Take 1 tablet (1,000 mcg total) by mouth daily. 08/10/20   Vann, Jessica U, DO  Zinc 30 MG TABS Take 30 mg by mouth.    [provider]    Allergies: Penicillins, Bee venom, and Ofev  [nintedanib]    Review of Systems  Updated Vital Signs BP (!) 146/81   Pulse 69   Temp 97.9 F (36.6 C) (Oral)   Resp 19   Ht 5' 6.5 (1.689 m)   Wt 63.5 kg   SpO2 100%   BMI 22.26 kg/m   Physical Exam  (all labs ordered are listed, but only abnormal results are  displayed) Labs Reviewed - No data to display  EKG: None  Radiology: DG Knee Complete 4 Views Left Result Date: 11/21/2023 CLINICAL DATA:  Fall down steps with left knee pain and laceration. EXAM: LEFT KNEE - COMPLETE 4+ VIEW COMPARISON:  Radiographs earlier the same date. FINDINGS: The bones appear mildly demineralized. No evidence of acute fracture or dislocation. The joint spaces are relatively preserved. Meniscal chondrocalcinosis again noted. Anterior soft tissue injury with skin laceration and soft tissue emphysema. No large joint effusion, definite intra-articular air or foreign body. IMPRESSION: Anterior soft tissue injury without evidence of acute fracture or dislocation. No definite intra-articular air or significant joint effusion identified to suggest joint violation. That could be more definitively evaluated with CT as clinically warranted. Electronically Signed   By: Elsie Perone M.D.   On: 11/21/2023 14:18   DG Knee Left Port Result Date: 11/21/2023 CLINICAL DATA:  Fall.  Knee pain EXAM: PORTABLE LEFT KNEE - 1-2 VIEW COMPARISON:  None Available. FINDINGS: Soft tissue laceration superficial to the patella. No evidence of patella fracture or dislocation. Linear lucency within the patella on AP view favored vascular channels. No femoral or tibial fracture. IMPRESSION: 1. Soft tissue laceration 2. No fracture or dislocation. Electronically Signed   By: Jackquline Boxer M.D.   On: 11/21/2023 13:49    {Document cardiac monitor, telemetry assessment procedure when appropriate:32947} Procedures   Medications Ordered in the ED  oxyCODONE -acetaminophen  (PERCOCET/ROXICET) 5-325 MG per tablet 1 tablet (1 tablet Oral Given 11/21/23 1355)  ondansetron  (ZOFRAN -ODT) disintegrating tablet 4 mg (4 mg Oral Given 11/21/23 1355)      {Click here for ABCD2, HEART and other calculators REFRESH Note before signing:1}                              Medical Decision Making Amount and/or Complexity of Data  Reviewed Radiology: ordered.  Risk Prescription drug management.   ***  {Document critical care time when appropriate  Document review of labs and clinical decision tools ie CHADS2VASC2, etc  Document your independent review of radiology images and any outside records  Document your discussion with family members, caretakers and with consultants  Document social determinants of health affecting pt's care  Document your decision making why or why not admission, treatments were needed:32947:::1}   Final diagnoses:  None    ED Discharge Orders     None

## 2023-11-21 NOTE — Progress Notes (Signed)
 Orthopedic Tech Progress Note Patient Details:  Joy Williams 23-Oct-1950 [id number]  Ortho Devices Type of Ortho Device: Knee Immobilizer Ortho Device/Splint Location: LLE Ortho Device/Splint Interventions: Ordered, Application, Adjustment   Post Interventions Patient Tolerated: Well, Difficulty with ambulation Instructions Provided: Poper ambulation with device  Issued crutches with crutch training for mobility.  Pt able to demonstrate use but needs supervision for safety.  After discussion with patient and son, son agrees that at home patient will be safer with use of the rollator and intermittent use of brakes during mobility.  He will provide supervision as needed.  Lavalle Skoda OTR/L 11/21/2023, 7:26 PM

## 2023-11-21 NOTE — Progress Notes (Deleted)
 Orthopedic Tech Progress Note Patient Details:  Joy Williams [birth year] [id number]  Ortho Devices Type of Ortho Device: Knee Immobilizer Ortho Device/Splint Location: LLE Ortho Device/Splint Interventions: Ordered, Application, Adjustment   Post Interventions Patient Tolerated: Well Instructions Provided: Adjustment of device  [name] OTR/L 11/21/2023, 6:52 PM

## 2023-11-21 NOTE — Discharge Instructions (Addendum)
 You were seen in the Emergency Department today for evaluation of your laceration. I am glad we were able to repair this for you. Please make sure that you are remembering to keep your wound clean daily with Dial soap and water and daily bandage changes. I recommend keeping the wound covered for the next 48-72 hours and then to your comfort afterwards. Do not expose the wound to any dishwater, pools, lakes, oceans, [employer], dirt or grime. Keeping the wound clean and away from contamination can help ensure good wound healing and help to prevent infections. You will need to return in 14 days for suture removal/evaluation. This can be down at your primary care office, urgent care, or the ER. Perferably, I would like for you to be evaluated by orthopedics at this time and have them look at the area to see if the sutures needs to be left in longer.  I am sending you home with some narcotic pain medication to take for breakthrough pain if your daily regimen is not giving you adequate relief. Please make sure to take your antibiotic as prescribed and complete the entirety of the course.  Additionally, have included more information on the RICE method.  The contusion on your knee/swelling is likely what is causing some ear pain and pressure.  Please make sure you leave on your brace.  Keeping your leg elevated above your heart and resting and staying off it is going to help you a lot with the pain and swelling.  If you have any concerns, new or worsening symptoms, please return to the nearest ER for re-evaluation.   Contact a doctor if: You got a tetanus shot and you have any of these problems where the needle went in: Swelling. Very bad pain. Redness. Bleeding. A wound that was closed breaks open. You have a fever. You have any of these signs of infection in your wound: More redness, swelling, or pain. Fluid or blood. Warmth. Pus or a bad smell. You see something coming out of the wound, such as wood or  glass. Medicine does not make your pain go away. You notice a change in the color of your skin near your wound. You need to change the bandage often. You have a new rash. You lose feeling (have numbness) around the wound. Get help right away if: You have very bad swelling around the wound. Your pain suddenly gets worse and is very bad. You have painful lumps near the wound or on skin anywhere on your body. You have a red streak going away from your wound. The wound is on your hand or foot, and: You cannot move a finger or toe. Your fingers or toes look pale or bluish.

## 2023-11-24 ENCOUNTER — Other Ambulatory Visit: Payer: Self-pay | Admitting: Family Medicine

## 2023-11-24 ENCOUNTER — Telehealth: Payer: Self-pay

## 2023-11-24 ENCOUNTER — Telehealth (HOSPITAL_COMMUNITY): Payer: Self-pay

## 2023-11-24 ENCOUNTER — Other Ambulatory Visit: Admitting: Family Medicine

## 2023-11-24 ENCOUNTER — Encounter: Payer: Self-pay | Admitting: Family Medicine

## 2023-11-24 MED ORDER — OXYCODONE HCL 5 MG PO CAPS: 5.0000 mg | ORAL_CAPSULE | ORAL | 0 refills | Status: AC | PRN

## 2023-11-24 NOTE — Telephone Encounter (Signed)
 Copied from CRM [id number]. Topic: General - Other >> Nov 24, 2023  9:52 AM Emylou G wrote: Reason for CRM: July 4rth fell - 42 stitches in knee last week .SABRA need f/u from hospital..and refill for Oxycodone .  Patient adv leaving Sunday on vacation until the 20th?  Pls advise on sooner appt.

## 2023-11-24 NOTE — Telephone Encounter (Signed)
 Copied from CRM [id number]. Topic: Clinical - Medication Refill >> Nov 24, 2023  9:52 AM Emylou G wrote: Medication: oxyCODONE  (ROXICODONE ) 5 MG immediate release tablet  I sent CRM to find sooner appt to be seen due to hospital f/u from 42 stitches ( due to going on vacation Sunday until the 20th )  Has the patient contacted their pharmacy? Yes (Agent: If no, request that the patient contact the pharmacy for the refill. If patient does not wish to contact the pharmacy document the reason why and proceed with request.) (Agent: If yes, when and what did the pharmacy advise?)  This is the patient's preferred pharmacy:  [id number] [place] [place] - [address] [place] [address] Phone: [phone] Fax: [phone]   Is this the correct pharmacy for this prescription? Yes If no, delete pharmacy and type the correct one.   Has the prescription been filled recently? No  Is the patient out of the medication? Yes  Has the patient been seen for an appointment in the last year OR does the patient have an upcoming appointment? Yes  Can we respond through MyChart? Yes  Agent: Please be advised that Rx refills may take up to 3 business days. We ask that you follow-up with your pharmacy.

## 2023-11-24 NOTE — Telephone Encounter (Signed)
 Patient called stating she fell on 7/04 and received 42 stitches in her knee. 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 I have marked her as MED HOLD in the pulmonary rehab schedule.

## 2023-11-25 ENCOUNTER — Encounter (HOSPITAL_COMMUNITY)

## 2023-11-25 ENCOUNTER — Other Ambulatory Visit: Payer: Self-pay | Admitting: Family Medicine

## 2023-11-25 MED ORDER — LACTULOSE 10 GM/15ML PO SOLN: 30.0000 g | Freq: Two times a day (BID) | ORAL | 1 refills | Status: DC | PRN

## 2023-11-26 ENCOUNTER — Telehealth (HOSPITAL_COMMUNITY): Payer: Self-pay

## 2023-11-26 NOTE — Progress Notes (Signed)
 Discharge Progress Report  Patient Details  Name: Joy Williams MRN: [id number] Date of Birth: 02-Jan-1951 Referring Provider:   [facility] Walk Test from 09/17/2023 in [facility]  Referring Provider [place]     Number of Visits: 14  Reason for Discharge:  Early Exit:  injury  Smoking History:  Social History   Tobacco Use  Smoking Status Former   Current packs/day: 0.00   Average packs/day: 1 pack/day for 40.1 years (40.1 ttl pk-yrs)   Types: Cigarettes   Start date: 05/21/1983   Quit date: 06/12/2023   Years since quitting: 0.4   Passive exposure: Never  Smokeless Tobacco Never  Tobacco Comments   Quit 06/12/23    Diagnosis:  Chronic obstructive pulmonary disease, unspecified COPD type (HCC)  ADL [facility]:  Pulmonary Assessment Scores     Row Name 07/07/23 1021 07/24/23 1537 07/29/23 1531     ADL [facility]SD   ADL Phase Entry Exit Exit   SOB Score total 6 7 --     CAT Score   CAT Score 11 12 --     mMRC Score   mMRC Score 2 -- 2    Row Name 09/17/23 1127         ADL [facility]SD   ADL Phase Entry     SOB Score total 31       CAT Score   CAT Score 7       mMRC Score   mMRC Score 2        Initial Exercise Prescription:  Initial Exercise Prescription - 09/17/23 1000       Date of Initial Exercise RX and Referring Provider   Date 09/17/23    Referring Provider D'amico    Expected Discharge Date 12/11/23      NuStep   Level 1    SPM 60    Minutes 15    METs 1.5      Track   Minutes 15    METs 1.95      Prescription Details   Frequency (times per week) 2    Duration Progress to 30 minutes of continuous aerobic without signs/symptoms of physical distress      Intensity   THRR 40-80% of Max Heartrate 59-132    Ratings of Perceived Exertion 11-13    Perceived Dyspnea 0-4      Progression   Progression Continue to progress workloads to maintain intensity without  signs/symptoms of physical distress.      Resistance Training   Training Prescription Yes    Weight red bands    Reps 10-15          Discharge Exercise Prescription (Final Exercise Prescription Changes):  Exercise Prescription Changes - 11/20/23 1155       Response to Exercise   Blood Pressure (Admit) 112/70    Blood Pressure (Exit) 104/58    Heart Rate (Admit) 69 bpm    Heart Rate (Exercise) 95 bpm    Heart Rate (Exit) 72 bpm    Oxygen Saturation (Admit) 99 %    Oxygen Saturation (Exercise) 93 %    Oxygen Saturation (Exit) 94 %    Rating of Perceived Exertion (Exercise) 11    Perceived Dyspnea (Exercise) 1    Duration Continue with 30 min of aerobic exercise without signs/symptoms of physical distress.    Intensity THRR unchanged      Progression   Progression Continue to progress workloads to  maintain intensity without signs/symptoms of physical distress.      Resistance Training   Training Prescription Yes    Weight blue bands    Reps 10-15    Time 10 Minutes      Treadmill   MPH 1.9    Grade 1.5    Minutes 15    METs 2.7      NuStep   Level 4    SPM 61    Minutes 15    METs 1.8          Functional Capacity:  6 Minute Walk     Row Name 07/07/23 1109 07/29/23 1518 09/17/23 1052     6 Minute Walk   Phase Initial Initial Initial   Distance 1300 feet 1460 feet 790 feet   Distance % Change -- 12.6 % --   Distance Feet Change -- 160 ft --   Walk Time 6 minutes 6 minutes 6 minutes   # of Rest Breaks 0 0 2  1:50-2:00, 4:10-4:34   MPH 2.46 2.77 0.6   METS 3.1 3.17 1.95   RPE 9 11 13    Perceived Dyspnea  0 1 1   VO2 Peak 10.85 11.09 6.84   Symptoms No No No   Resting HR 67 bpm 64 bpm 71 bpm   Resting BP 146/82 120/60 126/82   Resting Oxygen Saturation  100 % 91 % 100 %   Exercise Oxygen Saturation  during 6 min walk 95 % 91 % 95 %   Max Ex. HR 84 bpm 93 bpm 95 bpm   Max Ex. BP 160/80 116/74 134/70   2 Minute Post BP 170/80 114/70 120/70      Interval HR   1 Minute HR 71 74 82   2 Minute HR 78 85 86   3 Minute HR 80 86 90   4 Minute HR 79 85 95   5 Minute HR 82 92 95   6 Minute HR 84 93 93   2 Minute Post HR 68 77 85   Interval Heart Rate? Yes -- Yes     Interval Oxygen   Interval Oxygen? Yes -- Yes   Baseline Oxygen Saturation % 100 % 95 % 100 %   1 Minute Oxygen Saturation % 97 % 91 % 96 %   1 Minute Liters of Oxygen 0 L 0 L 0 L   2 Minute Oxygen Saturation % 95 % 93 % 98 %   2 Minute Liters of Oxygen 0 L 0 L 0 L   3 Minute Oxygen Saturation % 98 % 92 % 97 %   3 Minute Liters of Oxygen 0 L 0 L 0 L   4 Minute Oxygen Saturation % 98 % 96 % 95 %   4 Minute Liters of Oxygen 0 L 0 L 0 L   5 Minute Oxygen Saturation % 99 % 94 % 96 %   5 Minute Liters of Oxygen 0 L 0 L 0 L   6 Minute Oxygen Saturation % 98 % 96 % 94 %   6 Minute Liters of Oxygen 0 L 0 L 0 L   2 Minute Post Oxygen Saturation % 99 % 97 % 100 %   2 Minute Post Liters of Oxygen 0 L 0 L 0 L      Psychological, QOL, Others - Outcomes: PHQ 2/9:    09/17/2023   10:55 AM 08/26/2023    3:37 PM 07/24/2023    3:34 PM  07/07/2023   10:06 AM 09/04/2022    1:22 PM  Depression screen PHQ 2/9  Decreased Interest 0 0 0 0 0  Down, Depressed, Hopeless 0 0 0 0 0  PHQ - 2 Score 0 0 0 0 0  Altered sleeping 0 1 0 0   Tired, decreased energy 2 1 1 1    Change in appetite 0 0 0 0   Feeling bad or failure about yourself  0 0 0 0   Trouble concentrating 0 0 0 0   Moving slowly or fidgety/restless 1 0 0 0   Suicidal thoughts 0 0     PHQ-9 Score 3 2 1 1    Difficult doing work/chores Somewhat difficult  Not difficult at all Not difficult at all     Quality of Life:   Personal Goals: Goals established at orientation with interventions provided to work toward goal.  Personal Goals and Risk Factors at Admission - 09/17/23 1108       Core Components/Risk Factors/Patient Goals on Admission    Weight Management Yes;Weight Maintenance    Intervention Weight Management:  Develop a combined nutrition and exercise program designed to reach desired caloric intake, while maintaining appropriate intake of nutrient and fiber, sodium and fats, and appropriate energy expenditure required for the weight goal.;Weight Management: Provide education and appropriate resources to help participant work on and attain dietary goals.;Weight Management/Obesity: Establish reasonable short term and long term weight goals.;Obesity: Provide education and appropriate resources to help participant work on and attain dietary goals.    Admit Weight 137 lb 2 oz (62.2 kg)    Expected Outcomes Short Term: Continue to assess and modify interventions until short term weight is achieved;Long Term: Adherence to nutrition and physical activity/exercise program aimed toward attainment of established weight goal;Weight Maintenance: Understanding of the daily nutrition guidelines, which includes 25-35% calories from fat, 7% or less cal from saturated fats, less than 200mg  cholesterol, less than 1.5gm of sodium, & 5 or more servings of fruits and vegetables daily;Understanding recommendations for meals to include 15-35% energy as protein, 25-35% energy from fat, 35-60% energy from carbohydrates, less than 200mg  of dietary cholesterol, 20-35 gm of total fiber daily;Understanding of distribution of calorie intake throughout the day with the consumption of 4-5 meals/snacks    Improve shortness of breath with ADL's Yes    Intervention Provide education, individualized exercise plan and daily activity instruction to help decrease symptoms of SOB with activities of daily living.    Expected Outcomes Short Term: Improve cardiorespiratory fitness to achieve a reduction of symptoms when performing ADLs;Long Term: Be able to perform more ADLs without symptoms or delay the onset of symptoms           Personal Goals Discharge:  Goals and Risk Factor Review     Row Name 07/08/23 0932 07/30/23 0818 09/24/23 1347 10/22/23  1130 11/14/23 1533     Core Components/Risk Factors/Patient Goals Review   Personal Goals Review Improve shortness of breath with ADL's;Develop more efficient breathing techniques such as purse lipped breathing and diaphragmatic breathing and practicing self-pacing with activity. -- Weight Management/Obesity;Improve shortness of breath with ADL's;Develop more efficient breathing techniques such as purse lipped breathing and diaphragmatic breathing and practicing self-pacing with activity. Weight Management/Obesity;Improve shortness of breath with ADL's;Develop more efficient breathing techniques such as purse lipped breathing and diaphragmatic breathing and practicing self-pacing with activity. Weight Management/Obesity;Improve shortness of breath with ADL's;Develop more efficient breathing techniques such as purse lipped breathing and diaphragmatic breathing and practicing  self-pacing with activity.   Review Unable to assess, Joy Williams is scheduled to start the program today Joy Williams graduated from the PR program on 07/29/23. She only attended 6 sessions due to upcoming surgery. Goal not met for improving shortness of breath with ADL's. Goal met for developing more efficient breathing techniques such as purse lipped breathing and diaphragmatic breathing; and practicing self-pacing with activity. Joy Williams was able to demonstrate purse lip breathing when exercising. She was also able to pace herself when she would get SOB. Monthly review of patient's Core Components/Risk Factors/Patient Goals are as follows: Goal progressing for improving shortness of breath with ADL's. Joy Williams is currently exercising on RA to maintain sats >88%. She is currently exercising on the track and the Nustep. Goal progressing for developing more efficient breathing techniques such as purse lipped breathing and diaphragmatic breathing; and practicing self-pacing with activity. Goal progressing for maintaining weight. Joy Williams is working with  staff dietitician on ways to maintain her weight while in the program. Monthly review of patient's Core Components/Risk Factors/Patient Goals are as follows: Goal progressing for improving shortness of breath with ADL's. Joy Williams is currently exercising on RA to maintain sats >88%. She is currently exercising on the track and the Nustep. Goal progressing for developing more efficient breathing techniques such as purse lipped breathing and diaphragmatic breathing; and practicing self-pacing with activity. Goal progressing for maintaining weight. We will continue to monitor her progress throughout the program. Monthly review of patient's Core Components/Risk Factors/Patient Goals are as follows: Goal progressing for improving shortness of breath with ADL's. Joy Williams is currently exercising on RA to maintain sats >88%. She is currently exercising on the treadmill and the Nustep. Goal met for developing more efficient breathing techniques such as purse lipped breathing and diaphragmatic breathing; and practicing self-pacing with activity. Joy Williams is able to demonstrate PLB when she gets SOB. She also knows how to self pace herself based on her RPE/dyspnea scores. She has been practicing diaphragmatic breathing at home.  Goal progressing for maintaining weight. We will continue to monitor her progress throughout the program.   Expected Outcomes For Joy Williams to improve her shortness of breath with ADLs and develop more efficient breathing techniques such as purse lipped breathing and diaphragmatic breathing; and practicing self-pacing with activity Patient will continue to benefit from ongoing nutrition, exercise, and lifestyle modification To improve shortness of breath with ADL's, develop more efficient breathing techniques such as purse lipped breathing and diaphragmatic breathing; and practicing self-pacing with activity and maintain her weight. To improve shortness of breath with ADL's, develop more efficient breathing  techniques such as purse lipped breathing and diaphragmatic breathing; and practicing self-pacing with activity and maintain her weight. To improve shortness of breath with ADL's, and maintain her weight.    Row Name 11/26/23 1233             Core Components/Risk Factors/Patient Goals Review   Personal Goals Review Weight Management/Obesity;Improve shortness of breath with ADL's       Review Joy Williams withdrew from the [facility] program on 7/9 completing 14 sessions. Discharge review of patient's Core Components/Risk Factors/Patient Goals are as follows: Goal met for improving shortness of breath with ADL's. Joy Williams exercised on RA to maintain sats >88%. She exercised on the treadmill and the Nustep. Goal met for weight gain/maintenance. Joy Williams gained ~5.5# while in the program. She worked with our dietician on ways to increase calorie intake.       Expected Outcomes Pt will show progress toward meeting expected goals and  outcomes post PR          Exercise Goals and Review:  Exercise Goals     Row Name 07/07/23 1022 09/17/23 1056           Exercise Goals   Increase Physical Activity Yes Yes      Intervention Provide advice, education, support and counseling about physical activity/exercise needs.;Develop an individualized exercise prescription for aerobic and resistive training based on initial evaluation findings, risk stratification, comorbidities and participant's personal goals. Provide advice, education, support and counseling about physical activity/exercise needs.;Develop an individualized exercise prescription for aerobic and resistive training based on initial evaluation findings, risk stratification, comorbidities and participant's personal goals.      Expected Outcomes Short Term: Attend rehab on a regular basis to increase amount of physical activity.;Long Term: Add in home exercise to make exercise part of routine and to increase amount of physical activity.;Long Term: Exercising  regularly at least 3-5 days a week. Short Term: Attend rehab on a regular basis to increase amount of physical activity.;Long Term: Add in home exercise to make exercise part of routine and to increase amount of physical activity.;Long Term: Exercising regularly at least 3-5 days a week.      Increase Strength and Stamina Yes Yes      Intervention Provide advice, education, support and counseling about physical activity/exercise needs.;Develop an individualized exercise prescription for aerobic and resistive training based on initial evaluation findings, risk stratification, comorbidities and participant's personal goals. Provide advice, education, support and counseling about physical activity/exercise needs.;Develop an individualized exercise prescription for aerobic and resistive training based on initial evaluation findings, risk stratification, comorbidities and participant's personal goals.      Expected Outcomes Short Term: Increase workloads from initial exercise prescription for resistance, speed, and METs.;Short Term: Perform resistance training exercises routinely during rehab and add in resistance training at home;Long Term: Improve cardiorespiratory fitness, muscular endurance and strength as measured by increased METs and functional capacity ( ) Short Term: Increase workloads from initial exercise prescription for resistance, speed, and METs.;Short Term: Perform resistance training exercises routinely during rehab and add in resistance training at home;Long Term: Improve cardiorespiratory fitness, muscular endurance and strength as measured by increased METs and functional capacity ( )      Able to understand and use rate of perceived exertion (RPE) scale Yes Yes      Intervention Provide education and explanation on how to use RPE scale Provide education and explanation on how to use RPE scale      Expected Outcomes Short Term: Able to use RPE daily in rehab to express subjective intensity  level;Long Term:  Able to use RPE to guide intensity level when exercising independently Short Term: Able to use RPE daily in rehab to express subjective intensity level;Long Term:  Able to use RPE to guide intensity level when exercising independently      Able to understand and use Dyspnea scale Yes Yes      Intervention Provide education and explanation on how to use Dyspnea scale Provide education and explanation on how to use Dyspnea scale      Expected Outcomes Short Term: Able to use Dyspnea scale daily in rehab to express subjective sense of shortness of breath during exertion;Long Term: Able to use Dyspnea scale to guide intensity level when exercising independently Short Term: Able to use Dyspnea scale daily in rehab to express subjective sense of shortness of breath during exertion;Long Term: Able to use Dyspnea scale to guide intensity level when exercising  independently      Knowledge and understanding of Target Heart Rate Range (THRR) Yes Yes      Intervention Provide education and explanation of THRR including how the numbers were predicted and where they are located for reference Provide education and explanation of THRR including how the numbers were predicted and where they are located for reference      Expected Outcomes Short Term: Able to state/look up THRR;Long Term: Able to use THRR to govern intensity when exercising independently;Short Term: Able to use daily as guideline for intensity in rehab Short Term: Able to state/look up THRR;Long Term: Able to use THRR to govern intensity when exercising independently;Short Term: Able to use daily as guideline for intensity in rehab      Able to check pulse independently -- Yes      Intervention -- Provide education and demonstration on how to check pulse in carotid and radial arteries.;Review the importance of being able to check your own pulse for safety during independent exercise      Expected Outcomes -- Short Term: Able to explain why  pulse checking is important during independent exercise;Long Term: Able to check pulse independently and accurately      Understanding of Exercise Prescription Yes Yes      Intervention Provide education, explanation, and written materials on patient's individual exercise prescription Provide education, explanation, and written materials on patient's individual exercise prescription      Expected Outcomes Short Term: Able to explain program exercise prescription;Long Term: Able to explain home exercise prescription to exercise independently Short Term: Able to explain program exercise prescription;Long Term: Able to explain home exercise prescription to exercise independently         Exercise Goals Re-Evaluation:  Exercise Goals Re-Evaluation     Row Name 07/08/23 0744 07/29/23 1532 09/26/23 0932 10/24/23 0914 11/24/23 0956     Exercise Goal Re-Evaluation   Exercise Goals Review Increase Physical Activity;Able to understand and use Dyspnea scale;Understanding of Exercise Prescription;Increase Strength and Stamina;Knowledge and understanding of Target Heart Rate Range (THRR);Able to understand and use rate of perceived exertion (RPE) scale Increase Physical Activity;Able to understand and use Dyspnea scale;Understanding of Exercise Prescription;Increase Strength and Stamina;Knowledge and understanding of Target Heart Rate Range (THRR);Able to understand and use rate of perceived exertion (RPE) scale Increase Physical Activity;Able to understand and use Dyspnea scale;Understanding of Exercise Prescription;Increase Strength and Stamina;Knowledge and understanding of Target Heart Rate Range (THRR);Able to understand and use rate of perceived exertion (RPE) scale Increase Physical Activity;Able to understand and use Dyspnea scale;Understanding of Exercise Prescription;Increase Strength and Stamina;Knowledge and understanding of Target Heart Rate Range (THRR);Able to understand and use rate of perceived  exertion (RPE) scale Increase Physical Activity;Able to understand and use Dyspnea scale;Understanding of Exercise Prescription;Increase Strength and Stamina;Knowledge and understanding of Target Heart Rate Range (THRR);Able to understand and use rate of perceived exertion (RPE) scale   Comments Pt to begin exercise 07/08/23. Will progress as tolerated. Pt completed 6 exercise sessions. She needed to graduate early due to the need for lung resection. She plans to get another referral for PR after she recovers. She exercised on the treadmill, speed 2.5, incline 1.5, METs 3.1. She then exercises on the scifit bike for 15 min level 2.5, METs 3.5. Tolerated well. Increased her 6 min walk test by 12.6 %, 160 ft. Her SOB, CAT, and MMRC scores did not change with the short duration. Encouraged pt to continue walking at home. [place] has completed 2 exercise sessions. Pt  returned to PR after surgery. Joy Williams exercises for 15 min on the track and Nustep. She averages 2.69 METs on the track and 2.0 METs at level 1 on the Nustep. She performs the warmup and cooldown standing/seated due to her post-surgery pain. Joy Williams is very limited by her pain. She tries to exercise through the pain. Will continue to monitor and progress as able. Joy Williams has completed 8 exercise sessions. She exercises for 15 min on the track and Nustep. Joy Williams averages 2.54 METs on the track and 2 METs at level 2 on the Nustep. She performs the warmup and cooldown standing/ seated dependent on her post-op pain. Joy Williams is still limited by her pain. It is has been difficult to progress her. Joy Williams remains motivated to exercise despite her pain. Will continue to monitor and progress as able. Joy Williams has completed 14 exercise sessions. She exercises for 15 min on the treadmill and Nustep. Joy Williams averages 2.7 METs at 1.9 mph and 1.5% incline on the treadmill and 1.8 METs at level 3-4 on the Nustep. She performs the warmup and cooldown standing/ seated  dependent on her post-op pain. Joy Williams has progressed to walking the treadmill as she tolerates it well. She has recently had a fall which affect attendance for the next sessions. Will continue to monitor and progress as able.   Expected Outcomes Through exercise at rehab and home, the patient will decrease shortness of breath with daily activities and feel confident in carrying out an exercise regimen at home Through exercise at rehab and home, the patient will decrease shortness of breath with daily activities and feel confident in carrying out an exercise regimen at home Through exercise at rehab and home, the patient will decrease shortness of breath with daily activities and feel confident in carrying out an exercise regimen at home Through exercise at rehab and home, the patient will decrease shortness of breath with daily activities and feel confident in carrying out an exercise regimen at home Through exercise at rehab and home, the patient will decrease shortness of breath with daily activities and feel confident in carrying out an exercise regimen at home    Row Name 11/26/23 0950             Exercise Goal Re-Evaluation   Exercise Goals Review Increase Physical Activity;Able to understand and use Dyspnea scale;Understanding of Exercise Prescription;Increase Strength and Stamina;Knowledge and understanding of Target Heart Rate Range (THRR);Able to understand and use rate of perceived exertion (RPE) scale       Comments Joy Williams has completed 14 exercise sessions. Peak METs were 2.7 on the treadmill and 1.8 on the Nustep. Pt is being discharged due to injury.       Expected Outcomes Through exercise at rehab and home, the patient will decrease shortness of breath with daily activities and feel confident in carrying out an exercise regimen at home          Nutrition & Weight - Outcomes:  Pre Biometrics - 09/17/23 1049       Pre Biometrics   Grip Strength 22 kg          Post Biometrics  - 07/29/23 1531        Post  Biometrics   Grip Strength 24 kg          Nutrition:  Nutrition Therapy & Goals - 11/25/23 1052       Nutrition Therapy   Diet Heart Healthy diet    Drug/Food Interactions Statins/Certain Fruits  Personal Nutrition Goals   Nutrition Goal Patient to maintain weight/ identify strategies for weight gain while enrolled in pulmonary rehab.   goal in progress.   Personal Goal #2 Patient to improve diet quality by using the plate method as a guide for meal planning to include lean protein/plant protein, fruits, vegetables, whole grains, nonfat dairy as part of a well-balanced diet.   goal in progress.   Comments Goals in progress. 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 She has previously participated in cardiac rehab in 2018. Lipids are at goal. She is up 5.5# since starting with our program.. She is currently on medical hold due to recent fall. Patient will benefit from participation in pulmonary rehab for nutrition, exercise, and lifestyle modification as she is able to return.      Intervention Plan   Intervention Prescribe, educate and counsel regarding individualized specific dietary modifications aiming towards targeted core components such as weight, hypertension, lipid management, diabetes, heart failure and other comorbidities.;Nutrition handout(s) given to patient.    Expected Outcomes Short Term Goal: Understand basic principles of dietary content, such as calories, fat, sodium, cholesterol and nutrients.;Long Term Goal: Adherence to prescribed nutrition plan.          Nutrition Discharge:  Nutrition Assessments - 07/24/23 1542       MEDFICTS Scores   Post Score 64   Pt didn't return pre-form         Education Questionnaire Score:  Knowledge Questionnaire Score -  09/17/23 1142       Knowledge Questionnaire Score   Pre Score 16/18         Joy Williams had to withdraw from the program on 11/26/23 due to a fall over the 4th of July. She cut her knee, had to have stitches and needs to f/u with ortho. Since being unable to bend her knee, she is discharged from the program. At the time of discharge Joy Williams denies any new psy/soc barriers or concerns. She is disappointment about leaving the program and is concerned about deconditioning during her recovery. She stated that she is going to do physical therapy when she is cleared by her orthopedic MD.   Discharge review of patient's Core Components/Risk Factors/Patient Goals are as follows: Goal met for improving shortness of breath with ADL's. Joy Williams exercised on RA to maintain sats >88%. She exercised on the treadmill and the Nustep. Goal met for weight gain/maintenance. Joy Williams gained ~5.5# while in the program. She worked with our dietician on ways to increase calorie intake.

## 2023-11-26 NOTE — Telephone Encounter (Signed)
 Called Joy Williams back after she left [facility] yesterday. We discussed her medical hold in [place]. I mentioned discharging from [facility] to focus on healing her knee and possible PT. She agreed with my recommendations. Pt voices understanding of discharge and how to get knew referral. Will discharge pt.

## 2023-11-26 NOTE — Progress Notes (Signed)
 Pulmonary Individual Treatment Plan  Patient Details  Name: Joy Williams MRN: [id number] Date of Birth: [birth year] Referring Provider:   Conrad Ports Pulmonary Rehab Walk Test from 09/17/2023 in [facility]  Referring Provider D'amico    Initial Encounter Date:  Flowsheet Row Pulmonary Rehab Walk Test from 09/17/2023 in [facility]  Date 09/17/23    Visit Diagnosis: Chronic obstructive pulmonary disease, unspecified COPD type (HCC)  Patient's Home Medications on Admission:   Current Outpatient Medications:    acetaminophen  (TYLENOL ) 500 MG tablet, Take 1,000 mg by mouth every 6 (six) hours as needed for mild pain (pain score 1-3) or headache., Disp: , Rfl:    cephALEXin  (KEFLEX ) 500 MG capsule, Take 1 capsule (500 mg total) by mouth 4 (four) times daily., Disp: 20 capsule, Rfl: 0   Cholecalciferol (VITAMIN D ) 125 MCG (5000 UT) CAPS, Take 5,000 Units by mouth daily., Disp: , Rfl:    clopidogrel  (PLAVIX ) 75 MG tablet, Take 1 tablet (75 mg total) by mouth daily., Disp: 90 tablet, Rfl: 2   EPINEPHrine  0.3 mg/0.3 mL IJ SOAJ injection, Inject 0.3 mg into the muscle as needed., Disp: 1 each, Rfl: 1   ferrous sulfate  325 (65 FE) MG tablet, Take 1 tablet (325 mg total) by mouth daily. (Patient taking differently: Take 325 mg by mouth daily. OTC), Disp: 90 tablet, Rfl: 3   fluticasone  (FLONASE ) 50 MCG/ACT nasal spray, Place 1 spray into both nostrils daily., Disp: , Rfl:    Fluticasone -Umeclidin-Vilant (TRELEGY ELLIPTA ) 100-62.5-25 MCG/ACT AEPB, Inhale 1 puff into the lungs daily., Disp: , Rfl:    gabapentin  (NEURONTIN ) 300 MG capsule, Take 1 capsule (300 mg total) by mouth 2 (two) times daily., Disp: 60 capsule, Rfl: 2   hydrALAZINE  (APRESOLINE ) 25 MG tablet, Take 1 tablet (25 mg total) by mouth daily as needed (for systolic BP>150)., Disp: 30 tablet, Rfl: 9   ibuprofen (ADVIL)  200 MG tablet, Take 400 mg by mouth every 6 (six) hours as needed for moderate pain (pain score 4-6)., Disp: , Rfl:    lactulose  (CHRONULAC ) 10 GM/15ML solution, Take 45 mLs (30 g total) by mouth 2 (two) times daily as needed for moderate constipation or severe constipation., Disp: 946 mL, Rfl: 1   levothyroxine  (SYNTHROID ) 150 MCG tablet, Take 1 tablet (150 mcg total) by mouth daily., Disp: 90 tablet, Rfl: 3   lidocaine  (LIDODERM ) 5 %, Place 1 patch onto the skin daily. Remove & Discard patch within 12 hours or as directed by MD, Disp: 30 patch, Rfl: 0   MELATONIN PO, Take 1 tablet by mouth at bedtime as needed (sleep)., Disp: , Rfl:    methocarbamol  (ROBAXIN ) 500 MG tablet, Take 1 tablet (500 mg total) by mouth every 6 (six) hours as needed for muscle spasms., Disp: 30 tablet, Rfl: 2   Multiple Vitamins-Minerals (MULTIVITAMIN WITH MINERALS) tablet, Take 1 tablet by mouth daily., Disp: , Rfl:    nitroGLYCERIN  (NITROSTAT ) 0.4 MG SL tablet, Place 1 tablet (0.4 mg total) under the tongue every 5 (five) minutes as needed., Disp: 10 tablet, Rfl: 1   oxycodone  (OXY-IR) 5 MG capsule, Take 1 capsule (5 mg total) by mouth every 4 (four) hours as needed for up to 7 days (breakthrough pain)., Disp: 30 capsule, Rfl: 0   rosuvastatin  (CRESTOR ) 20 MG tablet, Take 1 tablet (20 mg total) by mouth every evening., Disp: 90 tablet, Rfl: 3  sennosides-docusate sodium  (SENOKOT-S) 8.6-50 MG tablet, Take 1 tablet by mouth daily., Disp: , Rfl:    TRELEGY ELLIPTA  100-62.5-25 MCG/ACT AEPB, INHALE 1 PUFF BY MOUTH EVERY DAY, Disp: 60 each, Rfl: 5   Turmeric 500 MG CAPS, Take 500 mg by mouth daily., Disp: , Rfl:    vitamin B-12 (CYANOCOBALAMIN ) 1000 MCG tablet, Take 1 tablet (1,000 mcg total) by mouth daily., Disp: , Rfl:    Zinc 30 MG TABS, Take 30 mg by mouth., Disp: , Rfl:   Past Medical History: Past Medical History:  Diagnosis Date   Allergy    Asthma    pt denies this, but is on Dulera    Carotid artery disease  (HCC) 2019   1-39% stenosis by Dopplers    Chronic diastolic congestive heart failure (HCC)    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   Complication of anesthesia    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 [facility] (had to have botox injection)   COPD (chronic obstructive pulmonary disease) (HCC)    Family history of adverse reaction to anesthesia    Mother- very sensitive to medication   Heart murmur    MVP   Hypothyroidism    Lung cancer (HCC) 12/30/2014   Synchronous primaries:  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, each treated by wedge resection with post-op adjuvant chemoRx at [facility]   MVP (mitral valve prolapse) 2018   s/p Minimally-Invasive Mitral Valve Replacement w/  [facility] Mitral bovine bioprosthetic tissue valve (size 31mm, model # 7300TFX, serial # [id number])   PONV (postoperative nausea and vomiting)    Raynaud's syndrome    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   31 mm Edwards Magna mitral bovine bioprosthetic tissue valve placed via right mini thoracotomy approach   Severe mitral regurgitation 11/15/2014   Shortness of breath dyspnea    with exertion   STD (sexually transmitted disease)    STEMI (ST elevation myocardial infarction) (HCC) 10/24/2020   Telangiectasia     Tobacco Use: Social History   Tobacco Use  Smoking Status Former   Current packs/day: 0.00   Average packs/day: 1 pack/day for 40.1 years (40.1 ttl pk-yrs)   Types: Cigarettes   Start date: 05/21/1983   Quit date: 06/12/2023   Years since quitting: 0.4   Passive exposure: Never  Smokeless Tobacco Never  Tobacco Comments   Quit 06/12/23    Labs: Review Flowsheet  More data exists      Latest Ref Rng & Units 08/07/2020 10/24/2020 05/30/2022 08/28/2022 08/17/2023  Labs for ITP Cardiac and Pulmonary Rehab  Cholestrol 100 - 199 mg/dL 883  864  861  857  -  LDL (calc) 0 - 99 mg/dL 50  55  54  55  -  HDL-C >39 mg/dL 46  60  66  67  -  Trlycerides 0 -  149 mg/dL 897  899  98  887  -  Hemoglobin A1c 4.8 - 5.6 % 5.6  5.5  - - -  TCO2 22 - 32 mmol/L - 24  - - 29     Capillary Blood Glucose: Lab Results  Component Value Date   GLUCAP 92 01/07/2023   GLUCAP 94 11/03/2020   GLUCAP 85 11/03/2020   GLUCAP 81 03/11/2018   GLUCAP 86 07/03/2016     Pulmonary Assessment Scores:  Pulmonary Assessment Scores     Row Name 07/07/23 1021 07/24/23 1537 07/29/23 1531     ADL UCSD  ADL Phase Entry Exit Exit   SOB Score total 6 7 --     CAT Score   CAT Score 11 12 --     mMRC Score   mMRC Score 2 -- 2    Row Name 09/17/23 1127         ADL UCSD   ADL Phase Entry     SOB Score total 31       CAT Score   CAT Score 7       mMRC Score   mMRC Score 2       UCSD: Self-administered rating of dyspnea associated with activities of daily living (ADLs) 6-point scale (0 = not at all to 5 = maximal or unable to do because of breathlessness)  Scoring Scores range from 0 to 120.  Minimally important difference is 5 units  CAT: CAT can identify the health impairment of COPD patients and is better correlated with disease progression.  CAT has a scoring range of zero to 40. The CAT score is classified into four groups of low (less than 10), medium (10 - 20), high (21-30) and very high (31-40) based on the impact level of disease on health status. A CAT score over 10 suggests significant symptoms.  A worsening CAT score could be explained by an exacerbation, poor medication adherence, poor inhaler technique, or progression of COPD or comorbid conditions.  CAT MCID is 2 points  mMRC: mMRC (Modified Medical Research Council) Dyspnea Scale is used to assess the degree of baseline functional disability in patients of respiratory disease due to dyspnea. No minimal important difference is established. A decrease in score of 1 point or greater is considered a positive change.   Pulmonary Function Assessment:  Pulmonary Function Assessment -  09/17/23 1109       Breath   Bilateral Breath Sounds Rales    Shortness of Breath Yes;Limiting activity;Fear of Shortness of Breath          Exercise Target Goals: Exercise Program Goal: Individual exercise prescription set using results from initial 6 min walk test and THRR while considering  patient's activity barriers and safety.   Exercise Prescription Goal: Initial exercise prescription builds to 30-45 minutes a day of aerobic activity, 2-3 days per week.  Home exercise guidelines will be given to patient during program as part of exercise prescription that the participant will acknowledge.  Activity Barriers & Risk Stratification:  Activity Barriers & Cardiac Risk Stratification - 09/17/23 1135       Activity Barriers & Cardiac Risk Stratification   Activity Barriers Deconditioning;Muscular Weakness;Shortness of Breath;Incisional Pain    Cardiac Risk Stratification Moderate          6 Minute Walk:  6 Minute Walk     Row Name 07/07/23 1109 07/29/23 1518 09/17/23 1052     6 Minute Walk   Phase Initial Initial Initial   Distance 1300 feet 1460 feet 790 feet   Distance % Change -- 12.6 % --   Distance Feet Change -- 160 ft --   Walk Time 6 minutes 6 minutes 6 minutes   # of Rest Breaks 0 0 2  1:50-2:00, 4:10-4:34   MPH 2.46 2.77 0.6   METS 3.1 3.17 1.95   RPE 9 11 13    Perceived Dyspnea  0 1 1   VO2 Peak 10.85 11.09 6.84   Symptoms No No No   Resting HR 67 bpm 64 bpm 71 bpm   Resting BP 146/82 120/60 126/82  Resting Oxygen Saturation  100 % 91 % 100 %   Exercise Oxygen Saturation  during 6 min walk 95 % 91 % 95 %   Max Ex. HR 84 bpm 93 bpm 95 bpm   Max Ex. BP 160/80 116/74 134/70   2 Minute Post BP 170/80 114/70 120/70     Interval HR   1 Minute HR 71 74 82   2 Minute HR 78 85 86   3 Minute HR 80 86 90   4 Minute HR 79 85 95   5 Minute HR 82 92 95   6 Minute HR 84 93 93   2 Minute Post HR 68 77 85   Interval Heart Rate? Yes -- Yes     Interval  Oxygen   Interval Oxygen? Yes -- Yes   Baseline Oxygen Saturation % 100 % 95 % 100 %   1 Minute Oxygen Saturation % 97 % 91 % 96 %   1 Minute Liters of Oxygen 0 L 0 L 0 L   2 Minute Oxygen Saturation % 95 % 93 % 98 %   2 Minute Liters of Oxygen 0 L 0 L 0 L   3 Minute Oxygen Saturation % 98 % 92 % 97 %   3 Minute Liters of Oxygen 0 L 0 L 0 L   4 Minute Oxygen Saturation % 98 % 96 % 95 %   4 Minute Liters of Oxygen 0 L 0 L 0 L   5 Minute Oxygen Saturation % 99 % 94 % 96 %   5 Minute Liters of Oxygen 0 L 0 L 0 L   6 Minute Oxygen Saturation % 98 % 96 % 94 %   6 Minute Liters of Oxygen 0 L 0 L 0 L   2 Minute Post Oxygen Saturation % 99 % 97 % 100 %   2 Minute Post Liters of Oxygen 0 L 0 L 0 L      Oxygen Initial Assessment:  Oxygen Initial Assessment - 09/17/23 1109       Home Oxygen   Home Oxygen Device None    Sleep Oxygen Prescription None    Home Exercise Oxygen Prescription None    Home Resting Oxygen Prescription None      Initial 6 min Walk   Oxygen Used None      Program Oxygen Prescription   Program Oxygen Prescription None      Intervention   Short Term Goals To learn and understand importance of maintaining oxygen saturations>88%;To learn and demonstrate proper use of respiratory medications;To learn and understand importance of monitoring SPO2 with pulse oximeter and demonstrate accurate use of the pulse oximeter.;To learn and demonstrate proper pursed lip breathing techniques or other breathing techniques.     Long  Term Goals Maintenance of O2 saturations>88%;Compliance with respiratory medication;Verbalizes importance of monitoring SPO2 with pulse oximeter and return demonstration;Exhibits proper breathing techniques, such as pursed lip breathing or other method taught during program session;Demonstrates proper use of MDI's          Oxygen Re-Evaluation:  Oxygen Re-Evaluation     Row Name 09/26/23 0935 10/24/23 0916 11/24/23 0958         Program Oxygen  Prescription   Program Oxygen Prescription None None None       Home Oxygen   Home Oxygen Device None None None     Sleep Oxygen Prescription None None None     Home Exercise Oxygen Prescription None None None  Home Resting Oxygen Prescription None None None       Goals/Expected Outcomes   Short Term Goals To learn and understand importance of maintaining oxygen saturations>88%;To learn and demonstrate proper use of respiratory medications;To learn and understand importance of monitoring SPO2 with pulse oximeter and demonstrate accurate use of the pulse oximeter.;To learn and demonstrate proper pursed lip breathing techniques or other breathing techniques.  To learn and understand importance of maintaining oxygen saturations>88%;To learn and demonstrate proper use of respiratory medications;To learn and understand importance of monitoring SPO2 with pulse oximeter and demonstrate accurate use of the pulse oximeter.;To learn and demonstrate proper pursed lip breathing techniques or other breathing techniques.  To learn and understand importance of maintaining oxygen saturations>88%;To learn and demonstrate proper use of respiratory medications;To learn and understand importance of monitoring SPO2 with pulse oximeter and demonstrate accurate use of the pulse oximeter.;To learn and demonstrate proper pursed lip breathing techniques or other breathing techniques.      Long  Term Goals Maintenance of O2 saturations>88%;Compliance with respiratory medication;Verbalizes importance of monitoring SPO2 with pulse oximeter and return demonstration;Exhibits proper breathing techniques, such as pursed lip breathing or other method taught during program session;Demonstrates proper use of MDI's Maintenance of O2 saturations>88%;Compliance with respiratory medication;Verbalizes importance of monitoring SPO2 with pulse oximeter and return demonstration;Exhibits proper breathing techniques, such as pursed lip breathing  or other method taught during program session;Demonstrates proper use of MDI's Maintenance of O2 saturations>88%;Compliance with respiratory medication;Verbalizes importance of monitoring SPO2 with pulse oximeter and return demonstration;Exhibits proper breathing techniques, such as pursed lip breathing or other method taught during program session;Demonstrates proper use of MDI's     Goals/Expected Outcomes Compliance and understanding of oxygen saturation monitoring and breathing techniques to decrease shortness of breath. Compliance and understanding of oxygen saturation monitoring and breathing techniques to decrease shortness of breath. Compliance and understanding of oxygen saturation monitoring and breathing techniques to decrease shortness of breath.        Oxygen Discharge (Final Oxygen Re-Evaluation):  Oxygen Re-Evaluation - 11/24/23 0958       Program Oxygen Prescription   Program Oxygen Prescription None      Home Oxygen   Home Oxygen Device None    Sleep Oxygen Prescription None    Home Exercise Oxygen Prescription None    Home Resting Oxygen Prescription None      Goals/Expected Outcomes   Short Term Goals To learn and understand importance of maintaining oxygen saturations>88%;To learn and demonstrate proper use of respiratory medications;To learn and understand importance of monitoring SPO2 with pulse oximeter and demonstrate accurate use of the pulse oximeter.;To learn and demonstrate proper pursed lip breathing techniques or other breathing techniques.     Long  Term Goals Maintenance of O2 saturations>88%;Compliance with respiratory medication;Verbalizes importance of monitoring SPO2 with pulse oximeter and return demonstration;Exhibits proper breathing techniques, such as pursed lip breathing or other method taught during program session;Demonstrates proper use of MDI's    Goals/Expected Outcomes Compliance and understanding of oxygen saturation monitoring and breathing  techniques to decrease shortness of breath.          Initial Exercise Prescription:  Initial Exercise Prescription - 09/17/23 1000       Date of Initial Exercise RX and Referring Provider   Date 09/17/23    Referring Provider D'amico    Expected Discharge Date 12/11/23      NuStep   Level 1    SPM 60    Minutes 15    METs 1.5  Track   Minutes 15    METs 1.95      Prescription Details   Frequency (times per week) 2    Duration Progress to 30 minutes of continuous aerobic without signs/symptoms of physical distress      Intensity   THRR 40-80% of Max Heartrate 59-132    Ratings of Perceived Exertion 11-13    Perceived Dyspnea 0-4      Progression   Progression Continue to progress workloads to maintain intensity without signs/symptoms of physical distress.      Resistance Training   Training Prescription Yes    Weight red bands    Reps 10-15          Perform Capillary Blood Glucose checks as needed.  Exercise Prescription Changes:   Exercise Prescription Changes     Row Name 07/22/23 1500 09/30/23 1100 10/14/23 1200 10/28/23 1200 11/11/23 1200     Response to Exercise   Blood Pressure (Admit) 102/60 128/76 140/84 104/70 114/68   Blood Pressure (Exercise) 122/70 130/68 150/70 120/60 130/70   Blood Pressure (Exit) 92/56 112/62 130/80 96/72 106/60   Heart Rate (Admit) 65 bpm 65 bpm 70 bpm 73 bpm 71 bpm   Heart Rate (Exercise) 82 bpm 88 bpm 96 bpm 85 bpm 93 bpm   Heart Rate (Exit) 71 bpm 67 bpm 73 bpm 84 bpm 64 bpm   Oxygen Saturation (Admit) 98 % 100 % 94 % 96 % 98 %   Oxygen Saturation (Exercise) 96 % 93 % 95 % 98 % 95 %   Oxygen Saturation (Exit) 96 % 97 % 97 % 97 % 95 %   Rating of Perceived Exertion (Exercise) 11 11 13 11 11    Perceived Dyspnea (Exercise) 0.5 1 2 1 1    Duration Continue with 30 min of aerobic exercise without signs/symptoms of physical distress. Continue with 30 min of aerobic exercise without signs/symptoms of physical distress.  Continue with 30 min of aerobic exercise without signs/symptoms of physical distress. Continue with 30 min of aerobic exercise without signs/symptoms of physical distress. Continue with 30 min of aerobic exercise without signs/symptoms of physical distress.   Intensity THRR unchanged THRR unchanged THRR unchanged THRR unchanged THRR unchanged     Progression   Progression Continue to progress workloads to maintain intensity without signs/symptoms of physical distress. Continue to progress workloads to maintain intensity without signs/symptoms of physical distress. Continue to progress workloads to maintain intensity without signs/symptoms of physical distress. Continue to progress workloads to maintain intensity without signs/symptoms of physical distress. Continue to progress workloads to maintain intensity without signs/symptoms of physical distress.   Average METs -- -- 1.6 -- --     Resistance Training   Training Prescription Yes Yes Yes Yes Yes   Weight blue bands red bands red bands blue bands blue bands   Reps 10-15 10-15 10-15 10-15 10-15   Time 10 Minutes 10 Minutes 10 Minutes 10 Minutes 10 Minutes     Treadmill   MPH 2.5 -- -- -- 1.8   Grade 1.5 -- -- -- 1   Minutes 215 -- -- -- 15   METs 2.8 -- -- -- 2.5     Bike   Level 2.5 -- -- -- --   Minutes 15 -- -- -- --   METs 3.3 -- -- -- --     NuStep   Level -- 1 2 3 3    SPM -- 77 68 73 79   Minutes -- 15 15 15  15  METs -- 1.9 1.6 2.1 2.1     Track   Laps -- 8 11 12  --   Minutes -- 15 15 15  --   METs -- 2.23 2.69 2.85 --    Row Name 11/20/23 1155             Response to Exercise   Blood Pressure (Admit) 112/70       Blood Pressure (Exit) 104/58       Heart Rate (Admit) 69 bpm       Heart Rate (Exercise) 95 bpm       Heart Rate (Exit) 72 bpm       Oxygen Saturation (Admit) 99 %       Oxygen Saturation (Exercise) 93 %       Oxygen Saturation (Exit) 94 %       Rating of Perceived Exertion (Exercise) 11        Perceived Dyspnea (Exercise) 1       Duration Continue with 30 min of aerobic exercise without signs/symptoms of physical distress.       Intensity THRR unchanged         Progression   Progression Continue to progress workloads to maintain intensity without signs/symptoms of physical distress.         Resistance Training   Training Prescription Yes       Weight blue bands       Reps 10-15       Time 10 Minutes         Treadmill   MPH 1.9       Grade 1.5       Minutes 15       METs 2.7         NuStep   Level 4       SPM 61       Minutes 15       METs 1.8          Exercise Comments:   Exercise Comments     Row Name 07/08/23 1540 09/23/23 1204         Exercise Comments Pt completed first day of exercise. She walked on the treadmill for 15 min, speed 1.7 mph, METs 2.2. She then exercised on the scifit bike for 15 min, level 1, METs 2.6. tolerated well. Performed warm up and cool down without restrictions. Discussed METs with good understanding. Pt completed first day of group exercise. She walked the track for 15 min with rest stops, METs 2.54. She then exercised on the recumbent stepper for 15 min, level 1, METs 1.7. Pt performed warm up and cool down with intermittent rests. She understands METs.         Exercise Goals and Review:   Exercise Goals     Row Name 07/07/23 1022 09/17/23 1056           Exercise Goals   Increase Physical Activity Yes Yes      Intervention Provide advice, education, support and counseling about physical activity/exercise needs.;Develop an individualized exercise prescription for aerobic and resistive training based on initial evaluation findings, risk stratification, comorbidities and participant's personal goals. Provide advice, education, support and counseling about physical activity/exercise needs.;Develop an individualized exercise prescription for aerobic and resistive training based on initial evaluation findings, risk stratification,  comorbidities and participant's personal goals.      Expected Outcomes Short Term: Attend rehab on a regular basis to increase amount of physical activity.;Long Term: Add in home exercise to make  exercise part of routine and to increase amount of physical activity.;Long Term: Exercising regularly at least 3-5 days a week. Short Term: Attend rehab on a regular basis to increase amount of physical activity.;Long Term: Add in home exercise to make exercise part of routine and to increase amount of physical activity.;Long Term: Exercising regularly at least 3-5 days a week.      Increase Strength and Stamina Yes Yes      Intervention Provide advice, education, support and counseling about physical activity/exercise needs.;Develop an individualized exercise prescription for aerobic and resistive training based on initial evaluation findings, risk stratification, comorbidities and participant's personal goals. Provide advice, education, support and counseling about physical activity/exercise needs.;Develop an individualized exercise prescription for aerobic and resistive training based on initial evaluation findings, risk stratification, comorbidities and participant's personal goals.      Expected Outcomes Short Term: Increase workloads from initial exercise prescription for resistance, speed, and METs.;Short Term: Perform resistance training exercises routinely during rehab and add in resistance training at home;Long Term: Improve cardiorespiratory fitness, muscular endurance and strength as measured by increased METs and functional capacity ( ) Short Term: Increase workloads from initial exercise prescription for resistance, speed, and METs.;Short Term: Perform resistance training exercises routinely during rehab and add in resistance training at home;Long Term: Improve cardiorespiratory fitness, muscular endurance and strength as measured by increased METs and functional capacity ( )      Able to understand  and use rate of perceived exertion (RPE) scale Yes Yes      Intervention Provide education and explanation on how to use RPE scale Provide education and explanation on how to use RPE scale      Expected Outcomes Short Term: Able to use RPE daily in rehab to express subjective intensity level;Long Term:  Able to use RPE to guide intensity level when exercising independently Short Term: Able to use RPE daily in rehab to express subjective intensity level;Long Term:  Able to use RPE to guide intensity level when exercising independently      Able to understand and use Dyspnea scale Yes Yes      Intervention Provide education and explanation on how to use Dyspnea scale Provide education and explanation on how to use Dyspnea scale      Expected Outcomes Short Term: Able to use Dyspnea scale daily in rehab to express subjective sense of shortness of breath during exertion;Long Term: Able to use Dyspnea scale to guide intensity level when exercising independently Short Term: Able to use Dyspnea scale daily in rehab to express subjective sense of shortness of breath during exertion;Long Term: Able to use Dyspnea scale to guide intensity level when exercising independently      Knowledge and understanding of Target Heart Rate Range (THRR) Yes Yes      Intervention Provide education and explanation of THRR including how the numbers were predicted and where they are located for reference Provide education and explanation of THRR including how the numbers were predicted and where they are located for reference      Expected Outcomes Short Term: Able to state/look up THRR;Long Term: Able to use THRR to govern intensity when exercising independently;Short Term: Able to use daily as guideline for intensity in rehab Short Term: Able to state/look up THRR;Long Term: Able to use THRR to govern intensity when exercising independently;Short Term: Able to use daily as guideline for intensity in rehab      Able to check pulse  independently -- Yes      Intervention --  Provide education and demonstration on how to check pulse in carotid and radial arteries.;Review the importance of being able to check your own pulse for safety during independent exercise      Expected Outcomes -- Short Term: Able to explain why pulse checking is important during independent exercise;Long Term: Able to check pulse independently and accurately      Understanding of Exercise Prescription Yes Yes      Intervention Provide education, explanation, and written materials on patient's individual exercise prescription Provide education, explanation, and written materials on patient's individual exercise prescription      Expected Outcomes Short Term: Able to explain program exercise prescription;Long Term: Able to explain home exercise prescription to exercise independently Short Term: Able to explain program exercise prescription;Long Term: Able to explain home exercise prescription to exercise independently         Exercise Goals Re-Evaluation :  Exercise Goals Re-Evaluation     Row Name 07/08/23 0744 07/29/23 1532 09/26/23 0932 10/24/23 0914 11/24/23 0956     Exercise Goal Re-Evaluation   Exercise Goals Review Increase Physical Activity;Able to understand and use Dyspnea scale;Understanding of Exercise Prescription;Increase Strength and Stamina;Knowledge and understanding of Target Heart Rate Range (THRR);Able to understand and use rate of perceived exertion (RPE) scale Increase Physical Activity;Able to understand and use Dyspnea scale;Understanding of Exercise Prescription;Increase Strength and Stamina;Knowledge and understanding of Target Heart Rate Range (THRR);Able to understand and use rate of perceived exertion (RPE) scale Increase Physical Activity;Able to understand and use Dyspnea scale;Understanding of Exercise Prescription;Increase Strength and Stamina;Knowledge and understanding of Target Heart Rate Range (THRR);Able to understand and  use rate of perceived exertion (RPE) scale Increase Physical Activity;Able to understand and use Dyspnea scale;Understanding of Exercise Prescription;Increase Strength and Stamina;Knowledge and understanding of Target Heart Rate Range (THRR);Able to understand and use rate of perceived exertion (RPE) scale Increase Physical Activity;Able to understand and use Dyspnea scale;Understanding of Exercise Prescription;Increase Strength and Stamina;Knowledge and understanding of Target Heart Rate Range (THRR);Able to understand and use rate of perceived exertion (RPE) scale   Comments Pt to begin exercise 07/08/23. Will progress as tolerated. Pt completed 6 exercise sessions. She needed to graduate early due to the need for lung resection. She plans to get another referral for PR after she recovers. She exercised on the treadmill, speed 2.5, incline 1.5, METs 3.1. She then exercises on the scifit bike for 15 min level 2.5, METs 3.5. Tolerated well. Increased her 6 min walk test by 12.6 %, 160 ft. Her SOB, CAT, and MMRC scores did not change with the short duration. Encouraged pt to continue walking at home. Joy Williams has completed 2 exercise sessions. Pt returned to PR after surgery. Joy Williams exercises for 15 min on the track and Nustep. She averages 2.69 METs on the track and 2.0 METs at level 1 on the Nustep. She performs the warmup and cooldown standing/seated due to her post-surgery pain. Joy Williams is very limited by her pain. She tries to exercise through the pain. Will continue to monitor and progress as able. Joy Williams has completed 8 exercise sessions. She exercises for 15 min on the track and Nustep. Joy Williams averages 2.54 METs on the track and 2 METs at level 2 on the Nustep. She performs the warmup and cooldown standing/ seated dependent on her post-op pain. Joy Williams is still limited by her pain. It is has been difficult to progress her. Joy Williams remains motivated to exercise despite her pain. Will continue to monitor and  progress as able. Joy Williams has completed  14 exercise sessions. She exercises for 15 min on the treadmill and Nustep. Analyce averages 2.7 METs at 1.9 mph and 1.5% incline on the treadmill and 1.8 METs at level 3-4 on the Nustep. She performs the warmup and cooldown standing/ seated dependent on her post-op pain. Joy Williams has progressed to walking the treadmill as she tolerates it well. She has recently had a fall which affect attendance for the next sessions. Will continue to monitor and progress as able.   Expected Outcomes Through exercise at rehab and home, the patient will decrease shortness of breath with daily activities and feel confident in carrying out an exercise regimen at home Through exercise at rehab and home, the patient will decrease shortness of breath with daily activities and feel confident in carrying out an exercise regimen at home Through exercise at rehab and home, the patient will decrease shortness of breath with daily activities and feel confident in carrying out an exercise regimen at home Through exercise at rehab and home, the patient will decrease shortness of breath with daily activities and feel confident in carrying out an exercise regimen at home Through exercise at rehab and home, the patient will decrease shortness of breath with daily activities and feel confident in carrying out an exercise regimen at home      Discharge Exercise Prescription (Final Exercise Prescription Changes):  Exercise Prescription Changes - 11/20/23 1155       Response to Exercise   Blood Pressure (Admit) 112/70    Blood Pressure (Exit) 104/58    Heart Rate (Admit) 69 bpm    Heart Rate (Exercise) 95 bpm    Heart Rate (Exit) 72 bpm    Oxygen Saturation (Admit) 99 %    Oxygen Saturation (Exercise) 93 %    Oxygen Saturation (Exit) 94 %    Rating of Perceived Exertion (Exercise) 11    Perceived Dyspnea (Exercise) 1    Duration Continue with 30 min of aerobic exercise without signs/symptoms of  physical distress.    Intensity THRR unchanged      Progression   Progression Continue to progress workloads to maintain intensity without signs/symptoms of physical distress.      Resistance Training   Training Prescription Yes    Weight blue bands    Reps 10-15    Time 10 Minutes      Treadmill   MPH 1.9    Grade 1.5    Minutes 15    METs 2.7      NuStep   Level 4    SPM 61    Minutes 15    METs 1.8          Nutrition:  Target Goals: Understanding of nutrition guidelines, daily intake of sodium 1500mg , cholesterol 200mg , calories 30% from fat and 7% or less from saturated fats, daily to have 5 or more servings of fruits and vegetables.  Biometrics:  Pre Biometrics - 09/17/23 1049       Pre Biometrics   Grip Strength 22 kg          Post Biometrics - 07/29/23 1531        Post  Biometrics   Grip Strength 24 kg          Nutrition Therapy Plan and Nutrition Goals:  Nutrition Therapy & Goals - 11/25/23 1052       Nutrition Therapy   Diet Heart Healthy diet    Drug/Food Interactions Statins/Certain Fruits      Personal Nutrition Goals  Nutrition Goal Patient to maintain weight/ identify strategies for weight gain while enrolled in pulmonary rehab.   goal in progress.   Personal Goal #2 Patient to improve diet quality by using the plate method as a guide for meal planning to include lean protein/plant protein, fruits, vegetables, whole grains, nonfat dairy as part of a well-balanced diet.   goal in progress.   Comments Goals in progress. 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 She has previously participated in cardiac rehab in 2018. Lipids are at goal. She is up 5.5# since starting with our program.. She is currently on medical hold due to recent fall. Patient will benefit from  participation in pulmonary rehab for nutrition, exercise, and lifestyle modification as she is able to return.      Intervention Plan   Intervention Prescribe, educate and counsel regarding individualized specific dietary modifications aiming towards targeted core components such as weight, hypertension, lipid management, diabetes, heart failure and other comorbidities.;Nutrition handout(s) given to patient.    Expected Outcomes Short Term Goal: Understand basic principles of dietary content, such as calories, fat, sodium, cholesterol and nutrients.;Long Term Goal: Adherence to prescribed nutrition plan.          Nutrition Assessments:  Nutrition Assessments - 07/24/23 1542       MEDFICTS Scores   Post Score 64   Pt didn't return pre-form        MEDIFICTS Score Key: >=70 Need to make dietary changes  40-70 Heart Healthy Diet <= 40 Therapeutic Level Cholesterol Diet  Flowsheet Row PULMONARY REHAB OTHER RESPIRATORY from 07/24/2023 in [facility]  Picture Your Plate Total Score on Discharge 64   Picture Your Plate Scores: <59 Unhealthy dietary pattern with much room for improvement. 41-50 Dietary pattern unlikely to meet recommendations for good health and room for improvement. 51-60 More healthful dietary pattern, with some room for improvement.  >60 Healthy dietary pattern, although there may be some specific behaviors that could be improved.    Nutrition Goals Re-Evaluation:  Nutrition Goals Re-Evaluation     Row Name 09/25/23 1115 10/28/23 1108 11/25/23 1052         Goals   Current Weight 137 lb 2 oz (62.2 kg) 138 lb 3.7 oz (62.7 kg) 142 lb 10.2 oz (64.7 kg)     Comment Lipids WNL, LDL 54 no new labs; most recent labs  Lipids WNL, LDL 54 no new labs; most recent labs Lipids WNL, LDL 54     Expected Outcome Patient has medical history of TIA, s/p mitral valve replacement, COPD, malignant neoplasm of overlapping  sites of left lung, NSTEMI, chronic diastolic heart failure. She has previously participated in cardiac rehab in 2018. Lipids are at goal. Patient will benefit from participation in pulmonary rehab for nutrition, exercise, and lifestyle modification. Goals in progress. 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 She has previously participated in cardiac rehab in 2018. Lipids are at goal. She is up 1.1# since starting with our program. Patient will benefit from participation in pulmonary rehab for nutrition, exercise, and lifestyle modification. Goals in progress. 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 She has previously participated in cardiac rehab in 2018. Lipids are at goal. She is up 5.5# since starting with our program.. She is currently on medical hold due to recent fall. Patient will benefit from participation in pulmonary rehab for nutrition, exercise, and lifestyle modification as she is able to return.        Nutrition Goals Discharge (Final Nutrition Goals Re-Evaluation):  Nutrition Goals Re-Evaluation - 11/25/23 1052       Goals   Current Weight 142 lb 10.2 oz (64.7 kg)    Comment no new labs; most recent labs Lipids WNL, LDL 54    Expected Outcome Goals in progress. 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 She has previously participated in cardiac rehab in 2018. Lipids are at goal. She is up 5.5# since starting with our program.. She is currently on medical hold due to recent fall. Patient will benefit from participation in  pulmonary rehab for nutrition, exercise, and lifestyle modification as she is able to return.          Psychosocial: Target Goals: Acknowledge presence or absence of significant depression and/or stress, maximize coping skills, provide positive support system. Participant is able to verbalize types and ability to use techniques and skills needed for reducing stress and depression.  Initial Review & Psychosocial Screening:  Initial Psych Review & Screening - 09/17/23 1107       Initial Review   Current issues with Current Stress Concerns    Source of Stress Concerns Chronic Illness;Unable to participate in former interests or hobbies    Comments Joy Williams recently had lung surgery. She is dealing with the stress of not being able to breath like she used to. She hopes exercise will help her stress.      Family Dynamics   Good Support System? Yes    Comments son and daughter      Barriers   Psychosocial barriers to participate in program The patient should benefit from training in stress management and relaxation.      Screening Interventions   Interventions Encouraged to exercise    Expected Outcomes Short Term goal: Utilizing psychosocial counselor, staff and physician to assist with identification of specific Stressors or current issues interfering with healing process. Setting desired goal for each stressor or current issue identified.;Long Term Goal: Stressors or current issues are controlled or eliminated.;Short Term goal: Identification and review with participant of any Quality of Life or Depression concerns found by scoring the questionnaire.;Long Term goal: The participant improves quality of Life and PHQ9 Scores as seen by post scores and/or verbalization of changes          Quality of Life Scores:  Scores of 19 and below usually indicate a poorer quality of life in these areas.  A difference of  2-3 points is a clinically meaningful difference.  A difference of 2-3 points in  the total score of the Quality of Life Index has been associated with significant improvement in overall quality of life, self-image, physical symptoms, and general health in studies assessing change in quality of life.  PHQ-9: Review Flowsheet  More data exists      09/17/2023 08/26/2023 07/24/2023 07/07/2023 09/04/2022  Depression screen PHQ 2/9  Decreased Interest 0 0 0 0 0  Down, Depressed,  Hopeless 0 0 0 0 0  PHQ - 2 Score 0 0 0 0 0  Altered sleeping 0 1 0 0 -  Tired, decreased energy 2 1 1 1  -  Change in appetite 0 0 0 0 -  Feeling bad or failure about yourself  0 0 0 0 -  Trouble concentrating 0 0 0 0 -  Moving slowly or fidgety/restless 1 0 0 0 -  Suicidal thoughts 0 0 - - -  PHQ-9 Score 3 2 1 1  -  Difficult doing work/chores Somewhat difficult - Not difficult at all Not difficult at all -   Interpretation of Total Score  Total Score Depression Severity:  1-4 = Minimal depression, 5-9 = Mild depression, 10-14 = Moderate depression, 15-19 = Moderately severe depression, 20-27 = Severe depression   Psychosocial Evaluation and Intervention:  Psychosocial Evaluation - 09/17/23 1131       Psychosocial Evaluation & Interventions   Interventions Stress management education;Encouraged to exercise with the program and follow exercise prescription    Comments Baeleigh recently had lung surgery. She is dealing with the stress of not being able to breath like she used to. She hopes exercise will help her stress. Staff will educate Despina on ways tp reduce her stress.    Expected Outcomes For Reality to have less stress during PR    Continue Psychosocial Services  No Follow up required          Psychosocial Re-Evaluation:  Psychosocial Re-Evaluation     Row Name 07/08/23 [phone] 07/30/23 9187 09/24/23 1344 10/22/23 1128 11/14/23 1532     Psychosocial Re-Evaluation   Current issues with None Identified None Identified Current Stress Concerns Current Stress Concerns Current Stress  Concerns   Comments No changes since orientation. Mirha is scheduled to start the program next week. Kaneshia graduated on 07/29/23 from the PR program. No new psychosocial barriers or concerns at the time of graduation. Margaretann has attended one class so far. She is still dealing with pain from her surgery which causes her to feel stressed. No new stressors or concerns at this time. Naija is doing great in the program. She continues to have pain from her surgery, but states she feels it is getting better. She denies any new stressors at this time. Clarise denies any new psychosocial barriers or concerns at this time. Her pain from her surgery is getting better day by day. Her stress level is low at this time.   Expected Outcomes For Morningstar to participate in PR before her lung surgery, for pt to not have any barriers or concerns For Kayline to apply what she learned in PR class For Inesha to participate in PR with les pain and stress For Helmi to participate in PR with les pain and stress For Lucendia to participate in PR with les pain and stress   Interventions Encouraged to attend Pulmonary Rehabilitation for the exercise -- -- Encouraged to attend Pulmonary Rehabilitation for the exercise Encouraged to attend Pulmonary Rehabilitation for the exercise   Continue Psychosocial Services  No Follow up required -- No Follow up required No Follow up required No Follow up required      Psychosocial Discharge (Final Psychosocial Re-Evaluation):  Psychosocial Re-Evaluation - 11/14/23 1532       Psychosocial Re-Evaluation   Current issues with Current Stress Concerns    Comments Shevonne denies any new psychosocial barriers or concerns at this time. Her pain from her surgery is getting better day by day. Her stress  level is low at this time.    Expected Outcomes For Alizee to participate in PR with les pain and stress    Interventions Encouraged to attend Pulmonary Rehabilitation for the exercise     Continue Psychosocial Services  No Follow up required          Education: Education Goals: Education classes will be provided on a weekly basis, covering required topics. Participant will state understanding/return demonstration of topics presented.  Learning Barriers/Preferences:  Learning Barriers/Preferences - 09/17/23 1108       Learning Barriers/Preferences   Learning Barriers Hearing;Reading    Learning Preferences Written Material;Skilled Demonstration          Education Topics: Know Your Numbers Group instruction that is supported by a PowerPoint presentation. Instructor discusses importance of knowing and understanding resting, exercise, and post-exercise oxygen saturation, heart rate, and blood pressure. Oxygen saturation, heart rate, blood pressure, rating of perceived exertion, and dyspnea are reviewed along with a normal range for these values.  Flowsheet Row PULMONARY REHAB OTHER RESPIRATORY from 11/13/2023 in [facility]  Date 11/13/23  Educator EP  Instruction Review Code 1- Verbalizes Understanding    Exercise for the Pulmonary Patient Group instruction that is supported by a PowerPoint presentation. Instructor discusses benefits of exercise, core components of exercise, frequency, duration, and intensity of an exercise routine, importance of utilizing pulse oximetry during exercise, safety while exercising, and options of places to exercise outside of rehab.    MET Level  Group instruction provided by PowerPoint, verbal discussion, and written material to support subject matter. Instructor reviews what METs are and how to increase METs.    Pulmonary Medications Verbally interactive group education provided by instructor with focus on inhaled medications and proper administration.   Anatomy and Physiology of the Respiratory System Group instruction provided by PowerPoint, verbal discussion, and  written material to support subject matter. Instructor reviews respiratory cycle and anatomical components of the respiratory system and their functions. Instructor also reviews differences in obstructive and restrictive respiratory diseases with examples of each.  Flowsheet Row PULMONARY REHAB OTHER RESPIRATORY from 07/24/2023 in [facility] Heart, Vascular, & Lung Health  Date 07/24/23  Educator RT  Instruction Review Code 1- Verbalizes Understanding    Oxygen Safety Group instruction provided by PowerPoint, verbal discussion, and written material to support subject matter. There is an overview of "What is Oxygen" and "Why do we need it".  Instructor also reviews how to create a safe environment for oxygen use, the importance of using oxygen as prescribed, and the risks of noncompliance. There is a brief discussion on traveling with oxygen and resources the patient may utilize. Flowsheet Row PULMONARY REHAB OTHER RESPIRATORY from 11/20/2023 in [facility]  Date 11/20/23  Educator RN  Instruction Review Code 1- Verbalizes Understanding    Oxygen Use Group instruction provided by PowerPoint, verbal discussion, and written material to discuss how supplemental oxygen is prescribed and different types of oxygen supply systems. Resources for more information are provided.    Breathing Techniques Group instruction that is supported by demonstration and informational handouts. Instructor discusses the benefits of pursed lip and diaphragmatic breathing and detailed demonstration on how to perform both.     Risk Factor Reduction Group instruction that is supported by a PowerPoint presentation. Instructor discusses the definition of a risk factor, different risk factors for pulmonary disease, and how  the heart and lungs work together.   Pulmonary Diseases Group instruction provided by PowerPoint, verbal discussion,  and written material to support subject matter. Instructor gives an overview of the different type of pulmonary diseases. There is also a discussion on risk factors and symptoms as well as ways to manage the diseases. Flowsheet Row PULMONARY REHAB OTHER RESPIRATORY from 10/16/2023 in [facility], [facility]  Date 10/16/23  Educator RT  Instruction Review Code 1- Verbalizes Understanding    Stress and Energy Conservation Group instruction provided by PowerPoint, verbal discussion, and written material to support subject matter. Instructor gives an overview of stress and the impact it can have on the body. Instructor also reviews ways to reduce stress. There is also a discussion on energy conservation and ways to conserve energy throughout the day.   Warning Signs and Symptoms Group instruction provided by PowerPoint, verbal discussion, and written material to support subject matter. Instructor reviews warning signs and symptoms of stroke, heart attack, cold and flu. Instructor also reviews ways to prevent the spread of infection. Flowsheet Row PULMONARY REHAB OTHER RESPIRATORY from 09/25/2023 in [facility] for Heart, Vascular, & Lung Health  Date 09/25/23  Educator RN  Instruction Review Code 1- Verbalizes Understanding    Other Education Group or individual verbal, written, or video instructions that support the educational goals of the pulmonary rehab program.    Knowledge Questionnaire Score:  Knowledge Questionnaire Score - 09/17/23 1142       Knowledge Questionnaire Score   Pre Score 16/18          Core Components/Risk Factors/Patient Goals at Admission:  Personal Goals and Risk Factors at Admission - 09/17/23 1108       Core Components/Risk Factors/Patient Goals on Admission    Weight Management Yes;Weight Maintenance    Intervention Weight Management: Develop a combined nutrition and exercise program  designed to reach desired caloric intake, while maintaining appropriate intake of nutrient and fiber, sodium and fats, and appropriate energy expenditure required for the weight goal.;Weight Management: Provide education and appropriate resources to help participant work on and attain dietary goals.;Weight Management/Obesity: Establish reasonable short term and long term weight goals.;Obesity: Provide education and appropriate resources to help participant work on and attain dietary goals.    Admit Weight 137 lb 2 oz (62.2 kg)    Expected Outcomes Short Term: Continue to assess and modify interventions until short term weight is achieved;Long Term: Adherence to nutrition and physical activity/exercise program aimed toward attainment of established weight goal;Weight Maintenance: Understanding of the daily nutrition guidelines, which includes 25-35% calories from fat, 7% or less cal from saturated fats, less than 200mg  cholesterol, less than 1.5gm of sodium, & 5 or more servings of fruits and vegetables daily;Understanding recommendations for meals to include 15-35% energy as protein, 25-35% energy from fat, 35-60% energy from carbohydrates, less than 200mg  of dietary cholesterol, 20-35 gm of total fiber daily;Understanding of distribution of calorie intake throughout the day with the consumption of 4-5 meals/snacks    Improve shortness of breath with ADL's Yes    Intervention Provide education, individualized exercise plan and daily activity instruction to help decrease symptoms of SOB with activities of daily living.    Expected Outcomes Short Term: Improve cardiorespiratory fitness to achieve a reduction of symptoms when performing ADLs;Long Term: Be able to perform more ADLs without symptoms or delay the onset of symptoms  Core Components/Risk Factors/Patient Goals Review:   Goals and Risk Factor Review     Row Name 07/08/23 0932 07/30/23 0818 09/24/23 1347 10/22/23 1130 11/14/23 1533      Core Components/Risk Factors/Patient Goals Review   Personal Goals Review Improve shortness of breath with ADL's;Develop more efficient breathing techniques such as purse lipped breathing and diaphragmatic breathing and practicing self-pacing with activity. -- Weight Management/Obesity;Improve shortness of breath with ADL's;Develop more efficient breathing techniques such as purse lipped breathing and diaphragmatic breathing and practicing self-pacing with activity. Weight Management/Obesity;Improve shortness of breath with ADL's;Develop more efficient breathing techniques such as purse lipped breathing and diaphragmatic breathing and practicing self-pacing with activity. Weight Management/Obesity;Improve shortness of breath with ADL's;Develop more efficient breathing techniques such as purse lipped breathing and diaphragmatic breathing and practicing self-pacing with activity.   Review Unable to assess, Kirstyn is scheduled to start the program today Maylea graduated from the PR program on 07/29/23. She only attended 6 sessions due to upcoming surgery. Goal not met for improving shortness of breath with ADL's. Goal met for developing more efficient breathing techniques such as purse lipped breathing and diaphragmatic breathing; and practicing self-pacing with activity. Deoborah was able to demonstrate purse lip breathing when exercising. She was also able to pace herself when she would get SOB. Monthly review of patient's Core Components/Risk Factors/Patient Goals are as follows: Goal progressing for improving shortness of breath with ADL's. Faron is currently exercising on RA to maintain sats >88%. She is currently exercising on the track and the Nustep. Goal progressing for developing more efficient breathing techniques such as purse lipped breathing and diaphragmatic breathing; and practicing self-pacing with activity. Goal progressing for maintaining weight. Joslyn is working with staff dietitician on ways  to maintain her weight while in the program. Monthly review of patient's Core Components/Risk Factors/Patient Goals are as follows: Goal progressing for improving shortness of breath with ADL's. Donnabelle is currently exercising on RA to maintain sats >88%. She is currently exercising on the track and the Nustep. Goal progressing for developing more efficient breathing techniques such as purse lipped breathing and diaphragmatic breathing; and practicing self-pacing with activity. Goal progressing for maintaining weight. We will continue to monitor her progress throughout the program. Monthly review of patient's Core Components/Risk Factors/Patient Goals are as follows: Goal progressing for improving shortness of breath with ADL's. Georga is currently exercising on RA to maintain sats >88%. She is currently exercising on the treadmill and the Nustep. Goal met for developing more efficient breathing techniques such as purse lipped breathing and diaphragmatic breathing; and practicing self-pacing with activity. Henessy is able to demonstrate PLB when she gets SOB. She also knows how to self pace herself based on her RPE/dyspnea scores. She has been practicing diaphragmatic breathing at home.  Goal progressing for maintaining weight. We will continue to monitor her progress throughout the program.   Expected Outcomes For Atalie to improve her shortness of breath with ADLs and develop more efficient breathing techniques such as purse lipped breathing and diaphragmatic breathing; and practicing self-pacing with activity Patient will continue to benefit from ongoing nutrition, exercise, and lifestyle modification To improve shortness of breath with ADL's, develop more efficient breathing techniques such as purse lipped breathing and diaphragmatic breathing; and practicing self-pacing with activity and maintain her weight. To improve shortness of breath with ADL's, develop more efficient breathing techniques such as purse  lipped breathing and diaphragmatic breathing; and practicing self-pacing with activity and maintain her weight. To improve shortness of breath with  ADL's, and maintain her weight.      Core Components/Risk Factors/Patient Goals at Discharge (Final Review):   Goals and Risk Factor Review - 11/14/23 1533       Core Components/Risk Factors/Patient Goals Review   Personal Goals Review Weight Management/Obesity;Improve shortness of breath with ADL's;Develop more efficient breathing techniques such as purse lipped breathing and diaphragmatic breathing and practicing self-pacing with activity.    Review Monthly review of patient's Core Components/Risk Factors/Patient Goals are as follows: Goal progressing for improving shortness of breath with ADL's. Maciah is currently exercising on RA to maintain sats >88%. She is currently exercising on the treadmill and the Nustep. Goal met for developing more efficient breathing techniques such as purse lipped breathing and diaphragmatic breathing; and practicing self-pacing with activity. Keayra is able to demonstrate PLB when she gets SOB. She also knows how to self pace herself based on her RPE/dyspnea scores. She has been practicing diaphragmatic breathing at home.  Goal progressing for maintaining weight. We will continue to monitor her progress throughout the program.    Expected Outcomes To improve shortness of breath with ADL's, and maintain her weight.          ITP Comments:Pt is making expected progress toward Pulmonary Rehab goals after completing 14 session(s). Recommend continued exercise, life style modification, education, and utilization of breathing techniques to increase stamina and strength, while also decreasing shortness of breath with exertion.  Dr. Slater Staff is Medical Director for Pulmonary Rehab at [facility].

## 2023-11-27 ENCOUNTER — Encounter (HOSPITAL_COMMUNITY)

## 2023-11-27 ENCOUNTER — Other Ambulatory Visit (HOSPITAL_COMMUNITY): Payer: Self-pay

## 2023-11-27 NOTE — Telephone Encounter (Signed)
 Medication is still filled at patients pharmacy-typically these higher prices are due to a deductible needing to be met.

## 2023-11-28 ENCOUNTER — Other Ambulatory Visit: Payer: Self-pay | Admitting: Family Medicine

## 2023-12-02 ENCOUNTER — Encounter (HOSPITAL_COMMUNITY)

## 2023-12-04 ENCOUNTER — Other Ambulatory Visit: Payer: Self-pay

## 2023-12-04 ENCOUNTER — Encounter (HOSPITAL_COMMUNITY)

## 2023-12-04 DIAGNOSIS — E785 Hyperlipidemia, unspecified: Secondary | ICD-10-CM

## 2023-12-04 DIAGNOSIS — I6523 Occlusion and stenosis of bilateral carotid arteries: Secondary | ICD-10-CM

## 2023-12-04 DIAGNOSIS — Z953 Presence of xenogenic heart valve: Secondary | ICD-10-CM

## 2023-12-04 MED ORDER — ROSUVASTATIN CALCIUM 20 MG PO TABS: 20.0000 mg | ORAL_TABLET | Freq: Every evening | ORAL | 0 refills | Status: AC

## 2023-12-08 DIAGNOSIS — S81012A Laceration without foreign body, left knee, initial encounter: Secondary | ICD-10-CM | POA: Diagnosis not present

## 2023-12-09 ENCOUNTER — Encounter (HOSPITAL_COMMUNITY)

## 2023-12-11 ENCOUNTER — Other Ambulatory Visit: Payer: Self-pay | Admitting: Family Medicine

## 2023-12-11 ENCOUNTER — Encounter (HOSPITAL_COMMUNITY)

## 2023-12-11 ENCOUNTER — Other Ambulatory Visit: Payer: Self-pay

## 2023-12-11 DIAGNOSIS — E039 Hypothyroidism, unspecified: Secondary | ICD-10-CM

## 2023-12-11 DIAGNOSIS — E785 Hyperlipidemia, unspecified: Secondary | ICD-10-CM

## 2023-12-11 DIAGNOSIS — E871 Hypo-osmolality and hyponatremia: Secondary | ICD-10-CM

## 2023-12-11 DIAGNOSIS — I10 Essential (primary) hypertension: Secondary | ICD-10-CM

## 2023-12-11 DIAGNOSIS — Z1321 Encounter for screening for nutritional disorder: Secondary | ICD-10-CM

## 2023-12-15 ENCOUNTER — Ambulatory Visit (HOSPITAL_COMMUNITY)
Admission: RE | Admit: 2023-12-15 | Discharge: 2023-12-15 | Disposition: A | Discharge status: Home / Self Care | Source: Ambulatory Visit | Attending: Internal Medicine | Admitting: Internal Medicine

## 2023-12-15 DIAGNOSIS — I6523 Occlusion and stenosis of bilateral carotid arteries: Secondary | ICD-10-CM | POA: Insufficient documentation

## 2023-12-16 ENCOUNTER — Ambulatory Visit: Payer: Self-pay | Admitting: Internal Medicine

## 2023-12-17 ENCOUNTER — Encounter: Admitting: Internal Medicine

## 2023-12-17 DIAGNOSIS — J849 Interstitial pulmonary disease, unspecified: Secondary | ICD-10-CM

## 2023-12-17 DIAGNOSIS — S81012D Laceration without foreign body, left knee, subsequent encounter: Secondary | ICD-10-CM | POA: Diagnosis not present

## 2023-12-17 DIAGNOSIS — Z006 Encounter for examination for normal comparison and control in clinical research program: Secondary | ICD-10-CM

## 2023-12-18 NOTE — Research (Signed)
 Title: Chronic Fibrosing Interstitial Lung Disease with Progressive Phenotype Prospective Outcomes (ILD-PRO) Registry   Protocol #: IPF-PRO-SUB, Clinical Trials # [id number], Sponsor: [employer] Protocol Amendment 6 Version dated 30Apr2024  [facility] Clinical Research Coordinator note:   This visit for Subject Joy Williams with DOB: 05-Nov-1950 on 12/18/2023 for the above protocol is Visit/Encounter # 3  and is for purpose of research.  THe Subject ID for the study is [id number]  [name]  [facility]) expressed continued interest and consent in continuing as a study subject. Subject confirmed that there was  no  (NO/YES) change in contact information (e.g. address, telephone, email). Subject thanked for participation in research and contribution to science.  [place] Arts development officer conducted the whole visit. Further details in the binder.  The Subject was informed that the PI Dorethia Cave continues to have oversight of the subject's visits and course through relevant discussions, reviews, and also specifically of this visit by routing of this note to the PI.    Signed by  Mansfield Pottier  Clinical Research Coordinator / Nurse / Specialist [facility]  [place],  Signature Time/Date: 10:00 AM 12/18/2023

## 2023-12-19 ENCOUNTER — Other Ambulatory Visit

## 2023-12-19 ENCOUNTER — Telehealth: Payer: Self-pay

## 2023-12-19 DIAGNOSIS — E039 Hypothyroidism, unspecified: Secondary | ICD-10-CM

## 2023-12-19 DIAGNOSIS — E871 Hypo-osmolality and hyponatremia: Secondary | ICD-10-CM

## 2023-12-19 DIAGNOSIS — I10 Essential (primary) hypertension: Secondary | ICD-10-CM

## 2023-12-19 DIAGNOSIS — Z1321 Encounter for screening for nutritional disorder: Secondary | ICD-10-CM

## 2023-12-19 DIAGNOSIS — E785 Hyperlipidemia, unspecified: Secondary | ICD-10-CM

## 2023-12-19 NOTE — Telephone Encounter (Signed)
 Copied from CRM [id number]. Topic: Clinical - Request for Lab/Test Order >> Dec 19, 2023  8:04 AM Joy Williams wrote: Reason for CRM: Pt wants to have her lab orders so she can take them at a lab corp, please advise. Wants to discuss options for retrieval. Please advise   Best contact: [phone]

## 2023-12-22 NOTE — Addendum Note (Signed)
 Addended by: ZIMMERMAN RUMPLE, Witney Huie D on: 12/22/2023 03:10 PM   Modules accepted: Orders

## 2023-12-22 NOTE — Telephone Encounter (Signed)
 Called and LVM/ Lab req is ready for pick up.

## 2023-12-24 DIAGNOSIS — M25562 Pain in left knee: Secondary | ICD-10-CM | POA: Diagnosis not present

## 2023-12-25 DIAGNOSIS — E039 Hypothyroidism, unspecified: Secondary | ICD-10-CM | POA: Diagnosis not present

## 2023-12-25 DIAGNOSIS — E785 Hyperlipidemia, unspecified: Secondary | ICD-10-CM | POA: Diagnosis not present

## 2023-12-25 DIAGNOSIS — Z1321 Encounter for screening for nutritional disorder: Secondary | ICD-10-CM | POA: Diagnosis not present

## 2023-12-25 DIAGNOSIS — I10 Essential (primary) hypertension: Secondary | ICD-10-CM | POA: Diagnosis not present

## 2023-12-25 DIAGNOSIS — E871 Hypo-osmolality and hyponatremia: Secondary | ICD-10-CM | POA: Diagnosis not present

## 2023-12-26 ENCOUNTER — Encounter: Payer: Self-pay | Admitting: Family Medicine

## 2023-12-26 ENCOUNTER — Ambulatory Visit (INDEPENDENT_AMBULATORY_CARE_PROVIDER_SITE_OTHER): Admitting: Family Medicine

## 2023-12-26 VITALS — BP 125/81 | HR 68 | Ht 66.5 in | Wt 138.4 lb

## 2023-12-26 DIAGNOSIS — E871 Hypo-osmolality and hyponatremia: Secondary | ICD-10-CM | POA: Diagnosis not present

## 2023-12-26 DIAGNOSIS — E039 Hypothyroidism, unspecified: Secondary | ICD-10-CM | POA: Diagnosis not present

## 2023-12-26 DIAGNOSIS — Z0000 Encounter for general adult medical examination without abnormal findings: Secondary | ICD-10-CM | POA: Diagnosis not present

## 2023-12-26 DIAGNOSIS — Z1231 Encounter for screening mammogram for malignant neoplasm of breast: Secondary | ICD-10-CM

## 2023-12-26 LAB — CBC WITH DIFFERENTIAL/PLATELET
Basophils Absolute: 0 x10E3/uL (ref 0.0–0.2)
Basos: 0 %
EOS (ABSOLUTE): 0.1 x10E3/uL (ref 0.0–0.4)
Eos: 1 %
Hematocrit: 37 % (ref 34.0–46.6)
Hemoglobin: 11.6 g/dL (ref 11.1–15.9)
Immature Grans (Abs): 0.1 x10E3/uL (ref 0.0–0.1)
Immature Granulocytes: 1 %
Lymphocytes Absolute: 0.8 x10E3/uL (ref 0.7–3.1)
Lymphs: 11 %
MCH: 26.7 pg (ref 26.6–33.0)
MCHC: 31.4 g/dL — ABNORMAL LOW (ref 31.5–35.7)
MCV: 85 fL (ref 79–97)
Monocytes Absolute: 0.4 x10E3/uL (ref 0.1–0.9)
Monocytes: 5 %
Neutrophils Absolute: 6.3 x10E3/uL (ref 1.4–7.0)
Neutrophils: 82 %
Platelets: 256 x10E3/uL (ref 150–450)
RBC: 4.35 x10E6/uL (ref 3.77–5.28)
RDW: 13.9 % (ref 11.7–15.4)
WBC: 7.6 x10E3/uL (ref 3.4–10.8)

## 2023-12-26 LAB — COMPREHENSIVE METABOLIC PANEL WITH GFR
ALT: 16 IU/L (ref 0–32)
AST: 30 IU/L (ref 0–40)
Albumin: 4.5 g/dL (ref 3.8–4.8)
Alkaline Phosphatase: 101 IU/L (ref 44–121)
BUN/Creatinine Ratio: 19 (ref 12–28)
BUN: 13 mg/dL (ref 8–27)
Bilirubin Total: 0.4 mg/dL (ref 0.0–1.2)
CO2: 23 mmol/L (ref 20–29)
Calcium: 9.6 mg/dL (ref 8.7–10.3)
Chloride: 93 mmol/L — ABNORMAL LOW (ref 96–106)
Creatinine, Ser: 0.67 mg/dL (ref 0.57–1.00)
Globulin, Total: 2.6 g/dL (ref 1.5–4.5)
Glucose: 72 mg/dL (ref 70–99)
Potassium: 4.4 mmol/L (ref 3.5–5.2)
Sodium: 132 mmol/L — ABNORMAL LOW (ref 134–144)
Total Protein: 7.1 g/dL (ref 6.0–8.5)
eGFR: 93 mL/min/1.73 (ref >59)

## 2023-12-26 LAB — LIPID PANEL
Chol/HDL Ratio: 2.3 ratio (ref 0.0–4.4)
Cholesterol, Total: 149 mg/dL (ref 100–199)
HDL: 66 mg/dL (ref >39)
LDL Chol Calc (NIH): 64 mg/dL (ref 0–99)
Triglycerides: 106 mg/dL (ref 0–149)
VLDL Cholesterol Cal: 19 mg/dL (ref 5–40)

## 2023-12-26 LAB — TSH: TSH: 6.9 u[IU]/mL — ABNORMAL HIGH (ref 0.450–4.500)

## 2023-12-26 LAB — HEMOGLOBIN A1C
Est. average glucose Bld gHb Est-mCnc: 105 mg/dL
Hgb A1c MFr Bld: 5.3 % (ref 4.8–5.6)

## 2023-12-26 LAB — VITAMIN D 25 HYDROXY (VIT D DEFICIENCY, FRACTURES): Vit D, 25-Hydroxy: 49.4 ng/mL (ref 30.0–100.0)

## 2023-12-26 NOTE — Assessment & Plan Note (Signed)
-   Long-standing history of asymptomatic, mild hyponatremia. No clear etiology identified. No medications are thought to be contributing. Prior significantly low level in March 2025 has since improved. - Continue to monitor. - Recheck sodium level with thyroid  labs in 6-8 weeks. - If sodium drops into the 120s, will consider trial of sodium chloride  tablets.

## 2023-12-26 NOTE — Patient Instructions (Signed)
 It was nice to see you today,  We addressed the following topics today: --I would like you to continue taking your 150 mcg thyroid  medicine on most days.  2 days a week I would like you to replace that with 175 mcg.  I would like these 2 days to be spaced apart, for example Friday and Tuesday or Saturday and Wednesday. - In 6 weeks we will recheck it along with your sodium level.  Have a great day,  Rolan Slain, MD

## 2023-12-26 NOTE — Assessment & Plan Note (Signed)
-   History of hypothyroidism with fluctuating TSH levels. Currently on levothyroxine  150 mcg daily with a recent TSH that is slightly elevated. Patient has a supply of 175 mcg tablets at home. - Take levothyroxine  175 mcg twice per week (e.g., Tuesday and Friday) and 150 mcg on the other five days. - Recheck TSH in 6-8 weeks.

## 2023-12-26 NOTE — Assessment & Plan Note (Signed)
-   Due for screening mammogram. - [facility]. Discussed interval of every 1-2 years is acceptable. - Medicare Annual Wellness Visit is due. Will schedule up front.

## 2023-12-26 NOTE — Progress Notes (Signed)
 Established Patient Office Visit  Subjective   Patient ID: Joy Williams, female    DOB: 09-Sep-1950  Age: 73 y.o. MRN: [id number]  Chief Complaint  Patient presents with   Medical Management of Chronic Issues    HPI  Subjective - Left knee injury: Reports a simple fall, landing directly on the knee. Sustained a linear laceration requiring 42 stitches. Seen at [facility]. Reports persistent swelling and pain around the calf, concerning for a possible pulled muscle, which will be evaluated by orthopedics. Hematoma is improving with the use of cold packs. Attended physical therapy and received exercises, which will be started after the next orthopedic follow-up. Ambulating without a cane, driving, and walking the dog. - Health Maintenance: Due for a mammogram. Plans [facility]. - Hypothyroidism: Reports oscillating between levothyroxine  175 mcg and 150 mcg. TSH is now slightly elevated on 150 mcg daily. - Hyponatremia: Aware of chronically low sodium levels. Has not used salt tablets previously. Reports a history of low sodium noted during a lobectomy and with other medical events (strokes, heart attacks, mitral valve replacement), but it was not significantly addressed until recently at [facility]. Denies symptoms related to current sodium level. 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 Managed by [facility].  Medications Reports taking levothyroxine , currently 150 mcg daily. Has a supply of 175 mcg tablets at home. Takes gabapentin  300 mg as needed for phantom lung pain, sometimes not daily. If pain persists, uses Tylenol . Has oxycodone  and morphine  from prior prescriptions but is not taking them.  PMH, PSH, Social Hx PMH: Hypothyroidism, chronic hyponatremia, strokes, heart attacks. PSH: Mitral valve replacement, lobectomy. 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  ROS MSK: Reports left knee swelling  and calf pain. Endocrine: Denies symptoms of hypothyroidism. Constitutional: Denies symptoms from hyponatremia. Neuro: Reports intermittent phantom lung pain.    The ASCVD Risk score (Arnett DK, et al., 2019) failed to calculate for the following reasons:   Risk score cannot be calculated because patient has a medical history suggesting prior/existing ASCVD  Health Maintenance Due  Topic Date Due   Colonoscopy  Never done   Pneumococcal Vaccine: 50+ Years (2 of 2 - PPSV23, PCV20, or PCV21) 03/19/2017   MAMMOGRAM  02/23/2023   COVID-19 Vaccine (6 - Moderna risk 2024-25 season) 07/28/2023   Medicare Annual Wellness (AWV)  09/04/2023   INFLUENZA VACCINE  12/19/2023      Objective:     BP 125/81   Pulse 68   Ht 5' 6.5 (1.689 m)   Wt 138 lb 6.4 oz (62.8 kg)   SpO2 95%   BMI 22.00 kg/m    Physical Exam Gen: alert, oriented Cv: rrr, no murmur Pulm: lctab. No wheezes Psych: pleasant affect   No results found for any visits on 12/26/23.      Assessment & Plan:   Hypothyroidism, unspecified type Assessment & Plan: - History of hypothyroidism with fluctuating TSH levels. Currently on levothyroxine  150 mcg daily with a recent TSH that is slightly elevated. Patient has a supply of 175 mcg tablets at home. - Take levothyroxine  175 mcg twice per week (e.g., Tuesday and Friday) and 150 mcg on the other five days. - Recheck TSH in 6-8 weeks.  Orders: -     TSH; Future  Hyponatremia Assessment & Plan: - Long-standing history of asymptomatic, mild hyponatremia. No clear etiology identified. No medications are thought to be contributing. Prior significantly low level in  March 2025 has since improved. - Continue to monitor. - Recheck sodium level with thyroid  labs in 6-8 weeks. - If sodium drops into the 120s, will consider trial of sodium chloride  tablets.  Orders: -     Basic metabolic panel with GFR; Future  Healthcare maintenance Assessment & Plan: - Due for  screening mammogram. - [facility]. Discussed interval of every 1-2 years is acceptable. - Medicare Annual Wellness Visit is due. Will schedule up front.     Encounter for screening mammogram for malignant neoplasm of breast -     3D Screening Mammogram, Left and Right; Future     Return in about 4 months (around 04/26/2024) for thyroid , sodium.    Toribio MARLA Slain, MD

## 2023-12-28 ENCOUNTER — Ambulatory Visit: Payer: Self-pay

## 2024-01-02 DIAGNOSIS — S81012A Laceration without foreign body, left knee, initial encounter: Secondary | ICD-10-CM | POA: Diagnosis not present

## 2024-01-02 DIAGNOSIS — M25562 Pain in left knee: Secondary | ICD-10-CM | POA: Diagnosis not present

## 2024-01-07 DIAGNOSIS — M25562 Pain in left knee: Secondary | ICD-10-CM | POA: Diagnosis not present

## 2024-01-14 DIAGNOSIS — M25562 Pain in left knee: Secondary | ICD-10-CM | POA: Diagnosis not present

## 2024-01-21 DIAGNOSIS — M25562 Pain in left knee: Secondary | ICD-10-CM | POA: Diagnosis not present

## 2024-01-26 ENCOUNTER — Telehealth: Payer: Self-pay

## 2024-01-26 NOTE — Telephone Encounter (Signed)
 [facility] said they carry the Pfizer vaccine.

## 2024-01-26 NOTE — Telephone Encounter (Signed)
 Pt is wanting a Rx for the COVID vaccine.   [facility] [address]

## 2024-01-27 ENCOUNTER — Other Ambulatory Visit: Payer: Self-pay | Admitting: Family Medicine

## 2024-01-27 MED ORDER — COVID-19 MRNA VACCINE (PFIZER) 30 MCG/0.3ML IM SUSP: 0.3000 mL | Freq: Once | INTRAMUSCULAR | 0 refills | Status: AC

## 2024-01-27 NOTE — Telephone Encounter (Signed)
Order has been sent in

## 2024-01-28 NOTE — Progress Notes (Unsigned)
 OV 10/12/2020  Subjective:  Patient ID: Joy Williams, female , DOB: 12/11/1950 , age 73 y.o. , MRN: [id number] , ADDRESS: [address] [place] [id number] PCP Blenda Headings, PA-C Patient Care Team: Blenda Headings, PA-C as PCP - General Hobart Powell BRAVO, MD as PCP - Cardiology (Cardiology) Maranda Leim VEAR, MD (Inactive) as Consulting Physician (Cardiology) Darlean Ozell NOVAK, MD as Consulting Physician (Pulmonary Disease) Everlean Fallow, MD as Consulting Physician (Rheumatology) Medora Ned, MD as Referring Physician Annell Ned, MD as Referring Physician (Internal Medicine) Luiz Channel, MD as Consulting Physician (Infectious Diseases)  This Provider for this visit: Treatment Team:  Attending Provider: Geronimo Amel, MD    10/12/2020 -   Chief Complaint  Patient presents with   Consult    Pt is being referred to Dr. Geronimo by Dr. Darlean.  Pt states that she has an occ cough but denies any other complaints.   Transfer of care from Dr. Ozell Darlean to Dr. Geronimo in the ILD center.  History is gained from talking to the patient and review of the medical records including [employer] and visualization of 2016 or 2017 CT scan of the chest.  HPI Joy Williams 73 y.o. -used to live in [place] .  She worked in the [employer] for [employer].  In 2016 she retired and came to [place].  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  Her primary care physician thought she might have crest syndrome because she had Raynaud's along with allergic cases and she had edema in her fingers.  After she moved here apparently she saw a rheumatologist [review of the chart indicates is Dr. Everlean who has since retired] and was told that she might not have scleroderma.  She has been on observation therapy from a rheumatological standpoint.  In fact never seen a  rheumatologist.  Then in 2016 she had early stage non-small cell lung cancer for which she underwent wedge resection left upper lobe.  She is followed up with [facility] since then.  She has had annual CT scans of the chest.  Most recent 1 was in May 2021.  She believes and per record review that she is in complete remission and I believe she might be discharged from follow-up there.  She tells me that at baseline she has very mild shortness of breath that is stable as documented below.  She also has amount of cough that is also documented below.  Is not clear that this is necessarily worse.  She is maintained on inhaled corticosteroid and long-acting beta agonist.  She is not sure if this is because of her diagnose of COPD other than the fact that she is on it for respiratory symptoms.  Most recently when she followed up with Dr. Chari review of the records revealed that there was interstitial lung disease findings on the CT scan of the chest in May 2021 at [facility].  In my review of the notes it appears that the pattern might have changed.  Infection was present even in 2016 2017 and currently the pattern might be changed.  She is definitely not feeling it.  Her symptoms are minimal and her walking desaturation test is normal.  Because of the ILD changes she has been referred to the ILD center    CT Chest [facility]/23/19 at [facility] (no image avail)  Interface, Rad Results In -  09/23/2019 11:22 AM EDT  Formatting of this note might be different from the original.  CT Chest   Indication:  Non-small cell lung cancer, monitor, C34.92 Malignant neoplasm  of unspecified part of left bronchus or lung (CMS-HCC).   Comparison Exams:  06/10/2019   Protocol:  Chest CT WO Protocol.  Contiguous 0.625 mm axial images with 5  mm axial, 3 mm coronal, and 3 mm sagittal reconstructions were obtained  from the neck base through the upper abdomen without IV contrast material.  In addition, 3-D maximal  intensity projection (MIP) reconstructions were  performed to potentially increase the sensitivity for the detection of  pulmonary nodules.   Findings:   The central airways are patent without endobronchial mass. The patient is  status post left upper lobe wedge resection which is stable in appearance.  Status post left lower lobe wedge resection with a similar appearance of  peripheral consolidation and groundglass lung with traction bronchiectasis  adjacent to the staple line. Redemonstrated findings of lower lobe  predominant pulmonary fibrosis with subpleural reticulation and probable  UIP pattern. Stable sub-6 mm pulmonary nodules and nodular opacities are  similar, index 5 mm pulmonary nodule in the anterior pleura of the right  upper lobe (5-661). New left lower lobe nodule on image 4-300 is favored to  represent endoluminal debris. No effusion or pneumothorax. High density  focus along the right diaphragm may represent pleural calcification.  Scattered calcified and noncalcified mediastinal and hilar lymph nodes,  measuring up to 1.3 cm in the left lower paratracheal region   Status post mitral valve are clear with mild cardiomegaly and coronary  artery calcifications in the LAD. No pericardial effusion.   There is a small hiatal hernia no aggressive osseous lesions.   The thyroid  gland is unremarkable. No axillary adenopathy or chest wall  abnormality.   Impression:   Stable versus slightly increased in subpleural consolidation in the left  lower lobe adjacent to the surgical staple line. Given curvilinear  appearance on sagittal imaging, findings are favored to represent confluent  fibrosis over malignancy. Consider additional follow-up chest CT in 3-6  months.   Additional nodules/nodular opacities are similar. Attention on follow-up.   Electronically Reviewed by:  Truman Malling, MD, [facility]  Electronically Reviewed on:  09/23/2019 11:14 AM    No results  found.   Dec 30 2014 removed LUL lung tissue with 2 tumors/ and L vc paralyzed ever since - last chemo 05/06/15   Last [facility] onc note:  Date of diagnosis: 12/2014 Diagnosis: NSCLC (Synchronous primaries) Histology: Adenocarcinoma Mutation status: EGFR pending, ALK, ROS1 negative Stage: Adenocarcinoma: T1aN0M0 Squamous T2aN0M0 Prior therapy: 1. Surgical resection with wedge resection and mediastinal lymph node s   OV 02/15/2021  Subjective:  Patient ID: Joy Williams, female , DOB: 1951/05/16 , age 89 y.o. , MRN: [id number] , ADDRESS: [address] [place] [phone] PCP Blenda Headings, PA-C Patient Care Team: Blenda Headings, PA-C as PCP - General Hobart Powell BRAVO, MD as PCP - Cardiology (Cardiology) Darlean Ozell NOVAK, MD as Consulting Physician (Pulmonary Disease) Everlean Fallow, MD as Consulting Physician (Rheumatology) Medora Ned, MD as Referring Physician Annell Ned, MD as Referring Physician (Internal Medicine) Luiz Channel, MD as Consulting Physician (Infectious Diseases) [name], Rounding, MD as Rounding Team (Cardiology)  This Provider for this visit: Treatment Team:  Attending Provider: Geronimo Amel, MD    02/15/2021 -   Chief Complaint  Patient presents with   Follow-up    PFT  performed today. Pt states she has been doing okay since last visit and states she is about the same.    HPI DAMYAH GUGEL 73 y.o. -presents for follow-up.  Last seen in May 2022.  At the time initiated ILD work-up.  Her ANA is strongly positive.  She has history of Raynaud's.  So we sent her to rheumatology.  She saw Dr Dolphus only recently.  First note reviewed.  No specific connective tissue disease identified. AVISE was sent out and they texted me today saying the results were negative.  Patient continues to overall feel the same.  She had pulmonary function test that shows decline over time.  Approximately 6 %/year over the last 18  months.  This is when FVC and DLCO are averaged.  She is definitely not feeling the difference.  Of note she had a Takotsubo cardiomyopathy in June 2022.  By July 2022 her echo normalized.  She will consult cardiac rehabilitation soon.  She continues to smoke but has reduced.  She nondistended importance of quitting smoking.  She will have a high-dose flu shot today.  We discussed COVID booster by Valent.       IMPRESSION: 1. There is mild pulmonary fibrosis in a pattern with apical to basal gradient, featuring irregular peripheral interstitial opacity, septal thickening, traction bronchiectasis, and subpleural bronchiolectasis at the lung basis without clear evidence of honeycombing. Fibrotic findings are worsened over time on prior examinations dating back to 11/16/2014. Findings are categorized as probable UIP per consensus guidelines: Diagnosis of Idiopathic Pulmonary Fibrosis: An Official ATS/ERS/JRS/ALAT Clinical Practice Guideline. Am JINNY Honey Crit Care Med Vol 198, Iss 5, [phone], Jan 18 2017. 2. Redemonstrated postoperative findings of anterior left upper lobe and dependent left lower lobe wedge resections. No evidence of malignant recurrence in the chest. 3. Coronary artery disease.   Aortic Atherosclerosis (ICD10-I70.0).     Electronically Signed   By: Marolyn Jaksch M.D.   OnResults for EARLY, ORD (MRN [id number]) as of 10/20/2020 16:40  Ref. Range 10/12/2020 16:37  Anti Nuclear Antibody (ANA) Latest Ref Range: NEGATIVE  POSITIVE (A)  ANA Pattern 1 Unknown Nuclear, Nucleolar (A)  ANA Titer 1 Latest Units: titer 1:1,280 (H)  Cyclic Citrullin Peptide Ab Latest Units: UNITS <16  ds DNA Ab Latest Units: IU/mL <1  Myeloperoxidase Abs Latest Units: AI <1.0  Serine Protease 3 Latest Units: AI <1.0  RA Latex Turbid. Latest Ref Range: <14 IU/mL <14  SSA (Ro) (ENA) Antibody, IgG Latest Ref Range: <1.0 NEG AI <1.0 NEG  SSB (La) (ENA) Antibody, IgG Latest Ref Range: <1.0 NEG  AI <1.0 NEG  Scleroderma (Scl-70) (ENA) Antibody, IgG Latest Ref Range: <1.0 NEG AI <1.0 NEG      OV 03/26/2021  Subjective:  Patient ID: Joy Williams, female , DOB: May 28, 1950 , age 23 y.o. , MRN: [id number] , ADDRESS: [address] [place] [phone] PCP Blenda Headings, PA-C Patient Care Team: Blenda Headings, PA-C as PCP - General Hobart Powell BRAVO, MD as PCP - Cardiology (Cardiology) Darlean Ozell NOVAK, MD as Consulting Physician (Pulmonary Disease) Everlean Fallow, MD as Consulting Physician (Rheumatology) Medora Ned, MD as Referring Physician Annell Ned, MD as Referring Physician (Internal Medicine) Luiz Channel, MD as Consulting Physician (Infectious Diseases) [name], Rounding, MD as Rounding Team (Cardiology)  This Provider for this visit: Treatment Team:  Attending Provider: Geronimo Amel, MD   03/26/2021 -  non-IPF ILD (IPAF) progressive phenotype.    HPI AJIA CHADDERDON 73 y.o. -  strted ofev  03/22/21. No diarrhea. Occ mild headache. Noticing mild change in appetite which she is keeping track of it. Went to rehab today and wanted to do video visit instead of face to face.  We discussed participation in clinical research.  She is very interested.  She will start off with doing the ILD-Pro registry.  I will have the research team send her a consent form.  If there is any interventional trial in the future then we will contact her about it.  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  And this included  CT Chest data  No results found.  Results for ETHELMAE, RINGEL (MRN [id number]) as of 03/26/2021 14:11  Ref. Range 11/30/2020 11:10  AST Latest Ref Range: 0 - 40 IU/L 23  ALT Latest Ref Range: 0 - 32 IU/L 12       OV 10/16/2021  Subjective:  Patient ID: Joy DEL DORCUS, female , DOB: May 27, 1950 , age 5 y.o. , MRN: [id number] , ADDRESS: [address] [place] [id number] PCP  Blenda Headings, PA-C Patient Care Team: Blenda Headings, PA-C as PCP - General Hobart Powell BRAVO, MD as PCP - Cardiology (Cardiology) Darlean Ozell NOVAK, MD as Consulting Physician (Pulmonary Disease) Everlean Fallow, MD as Consulting Physician (Rheumatology) Medora Ned, MD as Referring Physician Annell Ned, MD as Referring Physician (Internal Medicine) Luiz Channel, MD as Consulting Physician (Infectious Diseases) [name], Rounding, MD as Rounding Team (Cardiology)  This Provider for this visit: Treatment Team:  Attending Provider: Geronimo Amel, MD     10/16/2021 -   Chief Complaint  Patient presents with   Follow-up    Patient has been having side effects from the OFEV  medication which is the reason for today's visit. Pt states she has had to cut her OFEV  down to taking 1 pill a day versus the recommended 2 pills daily.     HPI PEACE NOYES 73 y.o. -returns for follow-up.  Clinically feels stable although symptom scores are worse compared to 1 year ago.  The main reason is that she is having severe diarrhea on full dose nintedanib.  She reduced it to 150 mg once daily and the diarrhea improved.  Now for the last 4 days she is stopped taking nintedanib because she ran out.  She tried several measures to control the diarrhea including taking Imodium all the time but she does not want to do it.  At baseline she does eat spicy food.  She also takes 1 Stevia a day along with 1 cup of coffee but there is no excessive sweetener use or dessert use.  She does not want to change her diet because she feels at baseline she was fine.  Symptom wise she is stable but how she feels but over the course of 1 year his shortness of breath appears to be worse.  Her last CT scan of the chest was in June 2022 Last pulmonary function test was in September 2022.  She has upcoming vacation to the [employer]   She is interested in ILD-Pro registry study but she missed the  window because she did not reply to Designer, industrial/product phone calls.  However she remains interested.   PFT   OV 03/18/2022  Subjective:  Patient ID: Joy DEL DORCUS, female , DOB: 03-25-1951 , age 38 y.o. , MRN: [id number] , ADDRESS: [address] [place] [phone] PCP Blenda Headings, PA-C Patient Care Team: Blenda Headings, PA-C as PCP -  General Hobart Powell BRAVO, MD as PCP - Cardiology (Cardiology) Darlean Ozell NOVAK, MD as Consulting Physician (Pulmonary Disease) Everlean Fallow, MD as Consulting Physician (Rheumatology) Medora Ned, MD as Referring Physician Annell Ned, MD as Referring Physician (Internal Medicine) Luiz Channel, MD as Consulting Physician (Infectious Diseases) [name], Rounding, MD as Rounding Team (Cardiology)  This Provider for this visit: Treatment Team:  Attending Provider: Geronimo Amel, MD    03/18/2022 -   Chief Complaint  Patient presents with   Follow-up    PFT performed today.  Pt states she has been doing okay since last visit. States she has had some congestion and also problems with coughing in the mornings.    HPI MAECI KALBFLEISCH 73 y.o. -returns for follow-up.  She is a mother of Corean Ellen who I got a note to the [employer].  Since her last visit she is doing well overall.  She is tolerating the low-dose of nintedanib well.  She had recent liver function tests monitoring but her sodium is low at 129 on February 06, 2022.  Her baseline is always between 130-132 on the lower side but this is even further lower.  She is not on any hydrochlorothiazide.  She is not on Lasix  currently.  She continues to smoke.  Her symptoms are stable although she is reporting worsening cough there is no wheezing the Trelegy does help but despite this during the night and early morning she is having worsening cough.  She did have a CT scan of the chest that shows progression over time including  from last year.  Her most recent pulmonary function test is slightly improved from a year ago but FVC is slightly down compared to a year and a half 2 years ago.  According to radiology ILD is progressive even compared to a year ago.  Definitely more progressive over time.  Overall it appears the nintedanib might be helping her.   In terms of her smoking we discussed this.  She is interested in quitting.  She has never tried Chantix  in the past.  We did discuss dreams and other side effects from it.  She denies any suicidal ideations.  We discussed how to quit smoking with the help of Chantix .  We at least went 3 minutes quitting smoking   Her lung cancer is in remission.   Based on CT chest September 2023    CT Chest data - HRCT Sept 2023  IMPRESSION: 1. Moderate pulmonary fibrosis in a pattern with apical to basal gradient, featuring irregular peripheral interstitial opacity, septal thickening, traction bronchiectasis, and subpleural bronchiolectasis at the lung bases, now with some evidence of honeycombing, slightly worsened compared to prior examination dated 10/18/2020 and more clearly worsened over a longer period of time dating back to 2016. Findings are consistent with UIP per consensus guidelines: Diagnosis of Idiopathic Pulmonary Fibrosis: An Official ATS/ERS/JRS/ALAT Clinical Practice Guideline. Am JINNY Honey Crit Care Med Vol 198, Iss 5, [phone], Jan 18 2017. 2. Status post anterior left upper lobe and posterior left lower lobe wedge resections. No evidence of malignant recurrence in the chest. 3. Coronary artery disease.   Aortic Atherosclerosis (ICD10-I70.0).     Electronically Signed   By: Marolyn JONETTA Jaksch M.D.   On: 01/20/2022 21:21   OV 11/19/2022  Subjective:  Patient ID: Joy Williams, female , DOB: [birth year] , age 76 y.o. , MRN: [id number] , ADDRESS: [address] [place] [phone] PCP Wallace Joesph LABOR, PA Patient Care  Team: Wallace Joesph LABOR,  PA as PCP - General (Family Medicine) Hobart Powell BRAVO, MD as PCP - Cardiology (Cardiology) Darlean Ozell NOVAK, MD as Consulting Physician (Pulmonary Disease) Everlean Fallow, MD as Consulting Physician (Rheumatology) Medora Ned, MD as Referring Physician Annell Ned, MD as Referring Physician (Internal Medicine) Luiz Channel, MD as Consulting Physician (Infectious Diseases) [name], Rounding, MD as Rounding Team (Cardiology)  This Provider for this visit: Treatment Team:  Attending Provider: Geronimo Amel, MD   11/19/2022 -   Chief Complaint  Patient presents with   Follow-up    Breathing has been ok  PFT:11/19/22 ACT 20      HPI Joy Williams 73 y.o. -returns for follow-up.  She completed her ILD-Pro registry research visit today.  She tells me that in May 2024 she did call us  with significant side effects from her nintedanib even at the low-dose protocol of 100 mg twice daily.  She had weight loss and diarrhea.  So we advised her to stop it.  She had already stopped it by then.  Currently she is feeling good her weight is gone up.  And her quality of life is better.  However she tells me that coinciding with this either because of stopping the nintedanib or because of the pollen according to her subjective report she is coughing more than usual.  She is also having some discolored sputum.  However her shortness of breath is around the same.  However on objective level overall her symptoms appear to be the same even compared to before.  We discussed about getting allergy workup today but took a shared decision making against this.  Access hypoxemia test did not show any desaturations but a pulmonary function test is suggesting decline.  Other issues - She did have questions about the ILD-Pro registry.  She is continue to participate in this but she wondered about blood test results from that.  I did indicate to her those were research labs and I do not have  access to what is being tested on that.  -Chronic systolic heart failure: A year ago her echo normalized.  Most recently yesterday she saw Dr. Powell Hobart her cardiologist and was asked to continue Plavix  and monitor her daily weights  -Lab review: August 28, 2022: Creatinine normal and hemoglobin normal   -Lung cancer recurrence surveillance: She will be due for his annual CT scan of the chest in September 2024 [also can be done for early progression]   -Smoking: She continues to smoke.   OV 03/04/2023  Subjective:  Patient ID: Joy Williams, female , DOB: [birth year] , age 68 y.o. , MRN: [id number] , ADDRESS: [address] [place] [phone] PCP Wallace Joesph LABOR, PA Patient Care Team: Wallace Joesph LABOR, PA as PCP - General (Family Medicine) Loni Soyla LABOR, MD as PCP - Cardiology (Cardiology) Darlean Ozell NOVAK, MD as Consulting Physician (Pulmonary Disease) Everlean Fallow, MD as Consulting Physician (Rheumatology) Medora Ned, MD as Referring Physician Annell Ned, MD as Referring Physician (Internal Medicine) Luiz Channel, MD as Consulting Physician (Infectious Diseases) [name], Rounding, MD as Rounding Team (Cardiology)  This Provider for this visit: Treatment Team:  Attending Provider: Geronimo Amel, MD    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    - start ofev  150mg  03/22/21 -> changed to low-dose in May 2023 due to GI side effects-> stopped  May 2024 due to side effects including diarrhea and weight loss  Follow-up ongoing smoking  History of left upper lobe non-small cell lung cancer resection 2016  - T2aN0,  2.8 cm, adenocarcinoma ( + visceral invasion - Rx [facility] = Adjuvant chemotherapy - 4 cycles under the direction of Dr. Annell  -in remission as of September 2023  -3 cm masslike soft tissue in the suture line left lower lobe July 2024 CT [low uptake on August 2024 PET scan]   -  -Seen Dr. Kerrin September 2024.    Evidence of histoplasma in 2016 hyalinized granuloma during left upper lobe wedge resection  Small hiatal hernia on CT scan chest September 2022 monitor for acid reflux symptoms  Other significant cardiac history -follows Dr. Powell Sorrow  -June 2022 hospitalization for Takotsubo cardiomyopathy with ejection fraction 25-35%.   -History of intolerance to GDMT treatment.  On Plavix    - ECHO improved 60% ef - July 2023  -Mitral valve prolapse status post severe mitral regurgitation status post bioprosthetic mitral valve replacement 2018  -Carotid artery disease status post right CEA March 2022  History of iron  deficiency anemia  Age-related osteoporosis   03/04/2023 -   Chief Complaint  Patient presents with   Follow-up    Cough-clear, sob-same     HPI MICHALENE DEBRULER 73 y.o. -presents for follow-up.  Presents with her daughter Lilyrose Tanney.  Patient was last seen in July 2024.  Daughter is an independent historian today.  Terms of  #Interstitial lung disease: She is off antifibrotic's.  She does not have any GI side effects anymore.  She feels her dyspnea is stable.  Symptom score shows slight worsening compared to recent visit but overall seems range bound.  She had pulmonary function test today the FVC is slightly down but again range bound the DLCO is stable.  Overall I would consider her to be stable.  She does have progressive phenotype but compared to recent PFTs she is stable.   She is agreed to monitor the situation.  Will discuss other antifibrotic or immunomodulators depending on further/future progression if this were to happen.  #Smoking: She really wants to quit.  She says she has Wellbutrin  with her but she has not started this.  We went over quit smoking programs.  Spent at least 3 minutes talking about it she smokes around 4 cigarettes/day.  #History of lung cancer and: After I saw her in summer 2024 we did a CT scan of the chest that shows along the suture line in the left lower lobe there is a 3 cm nodule.  I therefore referred her to Dr. Adine Gift who referred her to Dr. Kerrin who she saw in September 2024.  The PET scan uptake is low.  Dr. Kerrin felt that it was not in a good location along the suture line for a wedge resection.  Lobectomy versus sequential follow-up versus navigational bronc should be considered.  Dr. Gift then conferred and is ordered a CT scan of the chest in 6 months to make a decision.  Meanwhile she did indicate that she does not want to go back to  try her thoracic surgery anymore.  I did recommend that she solicit an opinion back at [facility] [facility] where original surgeon's or.  Her daughter and she conferred about this and they are open for this referral and will make this. OV 01/28/2024  Subjective:  Patient ID: Joy Williams, female , DOB: [birth year] , age 54 y.o. , MRN: [id number] , ADDRESS: [address] [place] [address] PCP Chandra Toribio POUR, MD Patient Care Team: Chandra Toribio POUR, MD as PCP - General (Family Medicine) Loni Soyla LABOR, MD as PCP - Cardiology (Cardiology) Darlean Ozell NOVAK, MD as Consulting Physician (Pulmonary Disease) Everlean Fallow, MD as Consulting Physician (Rheumatology) Medora Ned, MD as Referring Physician Annell Ned, MD as Referring Physician (Internal Medicine) Luiz Channel, MD as Consulting Physician (Infectious Diseases) [name], Rounding, MD as  Rounding Team (Cardiology) Prentis Duwaine BROCKS, RN as Oncology Nurse Navigator  This Provider for this visit: Treatment Team:  Attending Provider: Geronimo Amel, MD    01/28/2024 -  No chief complaint on file.    HPI LORE POLKA 73 y.o. -     SYMPTOM SCALE - ILD 10/12/2020  10/16/2021 138# 03/18/2022  11/19/2022  03/04/2023   O2 use ra ra ra a2 ra  Shortness of Breath 0 -> 5 scale with 5 being worst (score 6 If unable to do)   0   At rest 0 1 0 2 1  Simple tasks - showers, clothes change, eating, shaving 0 1 0 0 1  Household (dishes, doing bed, laundry) 1 2 0 0 2  Shopping 2 2 0 1 2  Walking level at own pace 1 1 0 1 1  Walking up Stairs 2 3 2 3 3   Total (30-36) Dyspnea Score 6 10 2 7 10   How bad is your cough? 2.5 3 3.5 3.5 2.5  How bad is your fatigue 2.5 2 2 2  1.5  How bad is nausea 0 0 0 0 0  How bad is vomiting?  0 0 0 0 0  How bad is diarrhea? 0 0 afer stopping all ofev  2 0 0  How bad is anxiety? 1 1 0 00 0  How bad is depression 0 0 0 0 0       Simple office walk 185 feet x  3 laps goal with forehead probe 10/12/2020  11/19/2022   O2 used ra ra  Number laps completed 3 Sit and stand x 10  Comments about pace avg   Resting Pulse Ox/HR 100% and 72/min 99% and HR 66  Final Pulse Ox/HR 100% and 95/min 96% and HR 73  Desaturated </= 88% no   Desaturated <= 3% points no   Got Tachycardic >/= 90/min yes   Symptoms at end of test No complaints No complaitns  Miscellaneous comments x      CT Chest data from date: ****  - personally visualized and independently interpreted : *** - my findings are: ***   PFT     Latest Ref Rng & Units 02/18/2023    9:55 AM 11/19/2022    1:11 PM 03/18/2022    1:47 PM 02/15/2021    2:49 PM 08/05/2019   10:55 AM 03/12/2016   10:26 AM 11/14/2014   11:05 AM  PFT Results  FVC-Pre L 2.26  2.33  2.48  2.35  2.52  2.67  2.97   FVC-Predicted Pre % 71  72  77  71  74  76  84   FVC-Post L  2.41  2.52  2.61  3.00    FVC-Predicted Post %    72  74  75  85   Pre FEV1/FVC % % 80  78  81  78  81  78  78   Post FEV1/FCV % %    81  82  80  78   FEV1-Pre L 1.81  1.82  2.00  1.84  2.04  2.09  2.33   FEV1-Predicted Pre % 75  75  82  73  79  78  86   FEV1-Post L    1.96  2.06  2.09  2.33   DLCO uncorrected ml/min/mmHg 11.76  11.03  13.70  12.17  13.77  13.52  14.61   DLCO UNC% % 56  53  66  57  64  48  52   DLCO corrected ml/min/mmHg 11.95  11.03  13.70  12.17  13.77  14.73    DLCO COR %Predicted % 57  53  66  57  64  53    DLVA Predicted % 81  82  83  86  87  84  65   TLC L    3.83  4.22  4.18  4.88   TLC % Predicted %    70  77  77  89   RV % Predicted %    64  74  71  88        LAB RESULTS last 96 hours No results found.       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   reports that she quit smoking about 7 months ago. Her smoking use included cigarettes. She started smoking about 40 years ago. She has a 40.1 pack-year smoking history. She has never been exposed to tobacco smoke. She has never used smokeless tobacco.  Past Surgical History:  Procedure Laterality Date   BACK SURGERY     x 3  Disectomy   BREAST BIOPSY Left    CARDIAC CATHETERIZATION N/A 03/08/2016   Procedure: Right/Left Heart Cath and Coronary  Angiography;  Surgeon: Ozell Fell, MD;  Location: [facility] INVASIVE CV LAB;  Service: Cardiovascular;  Laterality: N/A;   CLAVICLE SURGERY Left 2013   plate to left collar bone   ENDARTERECTOMY Right 08/09/2020   Procedure: RIGHT CAROTID ENDARTERECTOMY;  Surgeon: Sheree Penne Bruckner, MD;  Location: [facility] OR;  Service: Vascular;  Laterality: Right;   LAPAROSCOPY     ? reason-age 72    LEFT HEART CATH AND CORONARY ANGIOGRAPHY N/A 02/25/2019   Procedure: LEFT HEART CATH AND CORONARY ANGIOGRAPHY;  Surgeon: Dann Candyce RAMAN, MD;  Location: [facility] INVASIVE CV LAB;  Service: Cardiovascular;  Laterality: N/A;   LEFT HEART CATH AND CORONARY ANGIOGRAPHY N/A 10/24/2020   Procedure: LEFT HEART CATH AND CORONARY ANGIOGRAPHY;  Surgeon: Dann Candyce RAMAN, MD;  Location: [facility] INVAS[facility] CV LAB;  Service: Cardiovascular;  Laterality: N/A;   LUNG CANCER SURGERY     MITRAL VALVE REPAIR Right 06/27/2016   Procedure: MINIMALLY INVASIVE  MITRAL VALVE REPLACEMENT;  Surgeon: Sudie VEAR Laine, MD;  Location: [facility] OR;  Service: Open Heart Surgery;  Laterality: Right;   TEE WITHOUT CARDIOVERSION N/A 02/22/2016   Procedure: TRANSESOPHAGEAL ECHOCARDIOGRAM (TEE);  Surgeon: Leim VEAR Moose, MD;  Location: [facility] ENDOSCOPY;  Service: Cardiovascular;  Laterality: N/A;   TEE WITHOUT CARDIOVERSION N/A 06/27/2016   Procedure: TRANSESOPHAGEAL ECHOCARDIOGRAM (TEE);  Surgeon: Sudie VEAR Laine, MD;  Location: [facility] OR;  Service: Open Heart Surgery;  Laterality: N/A;   TONSILLECTOMY     VIDEO ASSISTED THORACOSCOPY (VATS)/WEDGE RESECTION Left 12/30/2014   Bronchoscopy, Mediastinoscopy, Left VATS for Wedge resection LUL x2 adn LLL x1 - Dr. Medora at [facility]   VOCAL CORD INJECTION Left 2017   injected with botox   wrist surgery Left 2015   plate to wrist     Allergies  Allergen Reactions   Penicillins Other (See Comments) and Hives    Unknown, occurred as a child  Unknown, occurred as a child, Has patient had a PCN reaction causing immediate rash,  facial/tongue/throat swelling, SOB or lightheadedness with hypotension: Yes, Has patient had a PCN reaction causing severe rash involving mucus membranes or skin necrosis: No, Has patient had a PCN reaction that required hospitalization , Has patient had a PCN reaction occurring within the last 10 years: No, If all of the above answers are NO, then may proceed with Cephalosporin use., No   Bee Venom Swelling    Severe swelling at the sting site.   Ofev  [Nintedanib] Diarrhea    Immunization History  Administered Date(s) Administered   Fluad Quad(high Dose 65+) 02/18/2019, 02/15/2021   Fluzone Influenza virus vaccine,trivalent (IIV3), split virus 03/06/2012   H1N1 04/13/2008   INFLUENZA, HIGH DOSE SEASONAL PF 02/20/2017, 04/27/2018, 03/04/2022   Influenza Split 02/18/2015   Influenza,inj,Quad PF,6+ Mos 03/16/2015   Influenza-Unspecified 04/12/2008, 02/18/2011, 01/28/2023   Moderna Sars-Covid-2 Vaccination 06/21/2019, 07/18/2019, 01/25/2020   PFIZER(Purple Top)SARS-COV-2 Vaccination 03/04/2022   Pfizer(Comirnaty)Fall Seasonal Vaccine 12 years and older 01/28/2023   Pneumococcal Conjugate-13 01/22/2017   Tdap 09/26/2015   Zoster Recombinant(Shingrix) 05/10/2018, 03/03/2019    Family History  Problem Relation Age of Onset   Mitral valve prolapse Mother    Dementia Father    Prostate cancer Father    Mitral valve prolapse Sister    Mitral valve prolapse Brother    Healthy Son    Healthy Daughter    Colon cancer Neg Hx    Colon polyps Neg Hx    Rectal cancer Neg Hx    Esophageal cancer Neg Hx    Stomach cancer Neg Hx    Heart attack Neg Hx      Current Outpatient Medications:    acetaminophen  (TYLENOL ) 500 MG tablet, Take 1,000 mg by mouth every 6 (six) hours as needed for mild pain (pain score 1-3) or headache., Disp: , Rfl:    cephALEXin  (KEFLEX ) 500 MG capsule, Take 1 capsule (500 mg total) by mouth 4 (four) times daily., Disp: 20 capsule, Rfl: 0   Cholecalciferol (VITAMIN  D) 125 MCG (5000 UT) CAPS, Take 5,000 Units by mouth daily., Disp: , Rfl:    clopidogrel  (PLAVIX ) 75 MG tablet, Take 1 tablet (75 mg total) by mouth daily., Disp: 90 tablet, Rfl: 2   EPINEPHrine  0.3 mg/0.3 mL IJ SOAJ injection, Inject 0.3 mg into the muscle as needed., Disp: 1 each, Rfl: 1   ferrous sulfate  325 (65 FE) MG tablet, Take 1 tablet (325 mg total) by mouth daily. (Patient taking differently: Take 325 mg  by mouth daily. OTC), Disp: 90 tablet, Rfl: 3   fluticasone  (FLONASE ) 50 MCG/ACT nasal spray, Place 1 spray into both nostrils daily., Disp: , Rfl:    Fluticasone -Umeclidin-Vilant (TRELEGY ELLIPTA ) 100-62.5-25 MCG/ACT AEPB, Inhale 1 puff into the lungs daily., Disp: , Rfl:    gabapentin  (NEURONTIN ) 300 MG capsule, TAKE 1 CAPSULE BY MOUTH TWICE A DAY, Disp: 60 capsule, Rfl: 2   hydrALAZINE  (APRESOLINE ) 25 MG tablet, Take 1 tablet (25 mg total) by mouth daily as needed (for systolic BP>150)., Disp: 30 tablet, Rfl: 9   ibuprofen (ADVIL) 200 MG tablet, Take 400 mg by mouth every 6 (six) hours as needed for moderate pain (pain score 4-6)., Disp: , Rfl:    lactulose  (CHRONULAC ) 10 GM/15ML solution, TAKE 45 MLS (30 GRAMS TOTAL) BY MOUTH 2 (TWO) TIMES DAILY AS NEEDED FOR MODERATE CONSTIPATION OR SEVERE CONSTIPATION., Disp: 90 mL, Rfl: 1   levothyroxine  (SYNTHROID ) 150 MCG tablet, Take 1 tablet (150 mcg total) by mouth daily., Disp: 90 tablet, Rfl: 3   lidocaine  (LIDODERM ) 5 %, Place 1 patch onto the skin daily. Remove & Discard patch within 12 hours or as directed by MD, Disp: 30 patch, Rfl: 0   MELATONIN PO, Take 1 tablet by mouth at bedtime as needed (sleep)., Disp: , Rfl:    methocarbamol  (ROBAXIN ) 500 MG tablet, Take 1 tablet (500 mg total) by mouth every 6 (six) hours as needed for muscle spasms., Disp: 30 tablet, Rfl: 2   Multiple Vitamins-Minerals (MULTIVITAMIN WITH MINERALS) tablet, Take 1 tablet by mouth daily., Disp: , Rfl:    nitroGLYCERIN  (NITROSTAT ) 0.4 MG SL tablet, Place 1 tablet (0.4  mg total) under the tongue every 5 (five) minutes as needed., Disp: 10 tablet, Rfl: 1   rosuvastatin  (CRESTOR ) 20 MG tablet, Take 1 tablet (20 mg total) by mouth every evening., Disp: 90 tablet, Rfl: 0   sennosides-docusate sodium  (SENOKOT-S) 8.6-50 MG tablet, Take 1 tablet by mouth daily., Disp: , Rfl:    TRELEGY ELLIPTA  100-62.5-25 MCG/ACT AEPB, INHALE 1 PUFF BY MOUTH EVERY DAY, Disp: 60 each, Rfl: 5   Turmeric 500 MG CAPS, Take 500 mg by mouth daily., Disp: , Rfl:    vitamin B-12 (CYANOCOBALAMIN ) 1000 MCG tablet, Take 1 tablet (1,000 mcg total) by mouth daily., Disp: , Rfl:    Zinc 30 MG TABS, Take 30 mg by mouth., Disp: , Rfl:       Objective:   There were no vitals filed for this visit.  Estimated body mass index is 22 kg/m as calculated from the following:   Height as of 12/26/23: 5' 6.5 (1.689 m).   Weight as of 12/26/23: 138 lb 6.4 oz (62.8 kg).  @WEIGHTCHANGE @  There were no vitals filed for this visit.   Physical Exam   General: No distress. *** O2 at rest: *** Cane present: *** Sitting in wheel chair: *** Frail: *** Obese: *** Neuro: Alert and Oriented x 3. GCS 15. Speech normal Psych: Pleasant Resp:  Barrel Chest - ***.  Wheeze - ***, Crackles - ***, No overt respiratory distress CVS: Normal heart sounds. Murmurs - *** Ext: Stigmata of Connective Tissue Disease - *** HEENT: Normal upper airway. PEERL +. No post nasal drip        Assessment/     Assessment & Plan ILD (interstitial lung disease) (HCC)  History of Raynaud's syndrome  ANA positive    PLAN Patient Instructions     ICD-10-CM   1. ILD (interstitial lung disease) (HCC)  J84.9  2. History of Raynaud's syndrome  Z86.79     3. ANA positive  R76.8     4. History of lung cancer  Z85.118     5. Nodule of lower lobe of left lung  R91.1     6. Current Cigarette smoker  F17.210     7. Smoking trying to quit  Z72.0       ILD (interstitial lung disease) (HCC) History of Raynaud's  syndrome ANA positive  - progressive phenotype  -currently/recently stable -Nintedanib caused significant GI side effects  Plan - supportive care without anti-fibrotics or immune modulator - Do spirometry and DLCO in February 2025   -Certainly if there is progression then we can consider other medications  -Currently focus on quitting smoking and pulmonary nodule  History of lung cancer Nodule of lower lobe of left lung  -According to Dr. Kerrin there is a slow-growing nodule in the left lower lobe along the suture line from previous lung cancer surgery.  This is not amenable to local wedge resection  Plan - Refer back to [facility] where original surgery was done to get a second opinion -CT scan of the chest without contrast [high-resolution] in February 2025  -Certainly can cancel if [employer] is already doing CT scans of new  Current Cigarette smoker Smoking trying to quit  PLAN   STart wellbutrin : Initial: 150 mg once daily for 3 days; increase to 150 mg twice daily treatment which you should continue for 12 weeks  Also, 2 WEEKS after your start wellbutrin  quit smoking    Followup -February 2025 seeks do spirometry and DLCO and CT scan of the chest   - symptom score and sit/stand test at follow-up  -30-minute visit  - if progression + -> consider immune modulartos +/- clinical trial     FOLLOWUP    No follow-ups on file.    SIGNATURE    Dr. Dorethia Cave, M.D., F.C.C.P,  Pulmonary and Critical Care Medicine Staff Physician, [facility] Director - Interstitial Lung Disease  Program  Pulmonary Fibrosis [facility] at [facility] [place], [place], [address]  Pager: [phone], If no answer or between  15:00h - 7:00h: call [phone] Telephone: [phone]  6:14 PM 01/28/2024   Moderate Complexity MDM OFFICE  2021 E/M guidelines, first released in 2021, with minor revisions added in 2023 and  2024 Must meet the requirements for 2 out of 3 dimensions to qualify.    Number and complexity of problems addressed Amount and/or complexity of data reviewed Risk of complications and/or morbidity  One or more chronic illness with mild exacerbation, OR progression, OR  side effects of treatment  Two or more stable chronic illnesses  One undiagnosed new problem with uncertain prognosis  One acute illness with systemic symptoms   One Acute complicated injury Must meet the requirements for 1 of 3 of the categories)  Category 1: Tests and documents, historian  Any combination of 3 of the following:  Assessment requiring an independent historian  Review of prior external note(s) from each unique source  Review of results of each unique test  Ordering of each unique test    Category 2: Interpretation of tests   Independent interpretation of a test performed by another physician/other qualified health care professional (not separately reported)  Category 3: Discuss management/tests  Discussion of management or test interpretation with external physician/other qualified health care professional/appropriate source (not separately reported) Moderate  risk of morbidity from additional diagnostic testing or treatment Examples only:  Prescription drug management  Decision regarding minor surgery with identfied patient or procedure risk factors  Decision regarding elective major surgery without identified patient or procedure risk factors  Diagnosis or treatment significantly limited by social determinants of health             HIGh Complexity  OFFICE   2021 E/M guidelines, first released in 2021, with minor revisions added in 2023. Must meet the requirements for 2 out of 3 dimensions to qualify.    Number and complexity of problems addressed Amount and/or complexity of data reviewed Risk of complications and/or morbidity  Severe exacerbation of chronic  illness  Acute or chronic illnesses that may pose a threat to life or bodily function, e.g., multiple trauma, acute MI, pulmonary embolus, severe respiratory distress, progressive rheumatoid arthritis, psychiatric illness with potential threat to self or others, peritonitis, acute renal failure, abrupt change in neurological status Must meet the requirements for 2 of 3 of the categories)  Category 1: Tests and documents, historian  Any combination of 3 of the following:  Assessment requiring an independent historian  Review of prior external note(s) from each unique source  Review of results of each unique test  Ordering of each unique test    Category 2: Interpretation of tests    Independent interpretation of a test performed by another physician/other qualified health care professional (not separately reported)  Category 3: Discuss management/tests  Discussion of management or test interpretation with external physician/other qualified health care professional/appropriate source (not separately reported)  HIGH risk of morbidity from additional diagnostic testing or treatment Examples only:  Drug therapy requiring intensive monitoring for toxicity  Decision for elective major surgery with identified pateint or procedure risk factors  Decision regarding hospitalization or escalation of level of care  Decision for DNR or to de-escalate care   Parenteral controlled  substances            LEGEND - Independent interpretation involves the interpretation of a test for which there is a CPT code, and an interpretation or report is customary. When a review and interpretation of a test is performed and documented by the provider, but not separately reported (billed), then this would represent an independent interpretation. This report does not need to conform to the usual standards of a complete report of the test. This does not include interpretation of tests that do not  have formal reports such as a complete blood count with differential and blood cultures. Examples would include reviewing a chest radiograph and documenting in the medical record an interpretation, but not separately reporting (billing) the interpretation of the chest radiograph.   An appropriate source includes professionals who are not health care professionals but may be involved in the management of the patient, such as a Clinical research associate, upper officer, case manager or teacher, and does not include discussion with family or informal caregivers.    - SDOH: SDOH are the conditions in the environments where people are born, live, learn, work, play, worship, and age that affect a wide range of health, functioning, and quality-of-life outcomes and risks. (e.g., housing, food insecurity, transportation, etc.). SDOH-related Z codes ranging from Z55-Z65 are the ICD-10-CM diagnosis codes used to document SDOH data Z55 - Problems related to education and literacy Z56 - Problems related to employment and unemployment Z57 - Occupational exposure to risk factors Z58 - Problems related to physical environment Z59 - Problems related to housing and  economic circumstances Z43 - Problems related to social environment [phone] - Problems related to upbringing [phone] - Other problems related to primary support group, including family circumstances Z42 - Problems related to certain psychosocial circumstances Z65 - Problems related to other psychosocial circumstances

## 2024-01-28 NOTE — Patient Instructions (Signed)
ICD-10-CM   1. ILD (interstitial lung disease) (HCC)  J84.9     2. History of Raynaud's syndrome  Z86.79     3. ANA positive  R76.8     4. History of lung cancer  Z85.118     5. Nodule of lower lobe of left lung  R91.1     6. Current Cigarette smoker  F17.210     7. Smoking trying to quit  Z72.0       ILD (interstitial lung disease) (HCC) History of Raynaud's syndrome ANA positive  - progressive phenotype  -currently/recently stable -Nintedanib caused significant GI side effects  Plan - supportive care without anti-fibrotics or immune modulator - Do spirometry and DLCO in February 2025   -Certainly if there is progression then we can consider other medications  -Currently focus on quitting smoking and pulmonary nodule  History of lung cancer Nodule of lower lobe of left lung  -According to Dr. Dorris Fetch there is a slow-growing nodule in the left lower lobe along the suture line from previous lung cancer surgery.  This is not amenable to local wedge resection  Plan - Refer back to [facility] where original surgery was done to get a second opinion -CT scan of the chest without contrast [high-resolution] in February 2025  -Certainly can cancel if [employer] is already doing CT scans of new  Current Cigarette smoker Smoking trying to quit  PLAN   STart wellbutrin: Initial: 150 mg once daily for 3 days; increase to 150 mg twice daily treatment which you should continue for 12 weeks  Also, 2 WEEKS after your start wellbutrin quit smoking    Followup -February 2025 seeks do spirometry and DLCO and CT scan of the chest   - symptom score and sit/stand test at follow-up  -30-minute visit  - if progression + -> consider immune modulartos +/- clinical trial

## 2024-01-29 ENCOUNTER — Encounter: Payer: Self-pay | Admitting: Internal Medicine

## 2024-01-29 ENCOUNTER — Ambulatory Visit: Admitting: Internal Medicine

## 2024-01-29 ENCOUNTER — Ambulatory Visit (INDEPENDENT_AMBULATORY_CARE_PROVIDER_SITE_OTHER): Admitting: Internal Medicine

## 2024-01-29 VITALS — BP 120/74 | HR 68 | Ht 65.0 in | Wt 141.0 lb

## 2024-01-29 DIAGNOSIS — R768 Other specified abnormal immunological findings in serum: Secondary | ICD-10-CM | POA: Diagnosis not present

## 2024-01-29 DIAGNOSIS — Z8679 Personal history of other diseases of the circulatory system: Secondary | ICD-10-CM

## 2024-01-29 DIAGNOSIS — F17210 Nicotine dependence, cigarettes, uncomplicated: Secondary | ICD-10-CM

## 2024-01-29 DIAGNOSIS — J849 Interstitial pulmonary disease, unspecified: Secondary | ICD-10-CM | POA: Diagnosis not present

## 2024-01-29 DIAGNOSIS — Z720 Tobacco use: Secondary | ICD-10-CM

## 2024-01-29 DIAGNOSIS — R911 Solitary pulmonary nodule: Secondary | ICD-10-CM

## 2024-01-29 DIAGNOSIS — Z85118 Personal history of other malignant neoplasm of bronchus and lung: Secondary | ICD-10-CM

## 2024-01-29 LAB — PULMONARY FUNCTION TEST
DL/VA % pred: 91 %
DL/VA: 3.73 ml/min/mmHg/L
DLCO cor % pred: 51 %
DLCO cor: 10.38 ml/min/mmHg
DLCO unc % pred: 48 %
DLCO unc: 9.76 ml/min/mmHg
FEF 25-75 Pre: 1.28 L/s
FEF2575-%Pred-Pre: 68 %
FEV1-%Pred-Pre: 65 %
FEV1-Pre: 1.5 L
FEV1FVC-%Pred-Pre: 101 %
FEV6-%Pred-Pre: 67 %
FEV6-Pre: 1.95 L
FEV6FVC-%Pred-Pre: 104 %
FVC-%Pred-Pre: 64 %
FVC-Pre: 1.95 L
Pre FEV1/FVC ratio: 77 %
Pre FEV6/FVC Ratio: 100 %

## 2024-01-29 MED ORDER — TRELEGY ELLIPTA 100-62.5-25 MCG/ACT IN AEPB: 1.0000 | INHALATION_SPRAY | Freq: Every day | RESPIRATORY_TRACT | Status: AC

## 2024-01-29 NOTE — Progress Notes (Signed)
 Spirometry and diffusion capacity performed today.

## 2024-01-29 NOTE — Patient Instructions (Signed)
 Spirometry and diffusion capacity performed today.

## 2024-02-04 DIAGNOSIS — S81012D Laceration without foreign body, left knee, subsequent encounter: Secondary | ICD-10-CM | POA: Diagnosis not present

## 2024-02-04 DIAGNOSIS — M25562 Pain in left knee: Secondary | ICD-10-CM | POA: Diagnosis not present

## 2024-02-06 ENCOUNTER — Encounter: Payer: Self-pay | Admitting: Internal Medicine

## 2024-02-06 ENCOUNTER — Ambulatory Visit

## 2024-02-06 ENCOUNTER — Other Ambulatory Visit: Payer: Self-pay | Admitting: Family Medicine

## 2024-02-06 MED ORDER — NIRMATRELVIR/RITONAVIR (PAXLOVID)TABLET: 3.0000 | ORAL_TABLET | Freq: Two times a day (BID) | ORAL | 0 refills | Status: AC

## 2024-02-06 NOTE — Progress Notes (Signed)
 Attempted to call patient but phone number was not going through. Contacted [place], who contacted her mother through a 3-way phone call. Discussed pausing her rosuvastatin  while she is taking Paxlovid . Reviewed kidney function, without concern. Rx sent to pharmacy on file. Discussed when to seek emergency care.   Evalene Arts, FNP-C [facility]  [address], [place], [place] [address] [phone]

## 2024-02-07 ENCOUNTER — Telehealth: Admitting: Family Medicine

## 2024-02-07 DIAGNOSIS — U071 COVID-19: Secondary | ICD-10-CM | POA: Diagnosis not present

## 2024-02-07 NOTE — Patient Instructions (Signed)
 COVID-19: What to Know COVID-19 is an infection caused by a virus called SARS-CoV-2. This type of virus is called a coronavirus. People with COVID-19 may: Have few to no symptoms. Have mild to moderate symptoms that affect their lungs and breathing. Get very sick. What are the causes?  COVID-19 is caused by a virus. This virus may be in the air as droplets or on surfaces. It can spread from an infected person when they cough, sneeze, speak, sing, or breathe. You may become infected if: You breathe in the infected droplets in the air. You touch an object that has the virus on it. What increases the risk? You are at risk of getting COVID-19 if you have been around someone with the infection. You may be more likely to get very sick if: You are 73 years old or older. You have certain medical conditions, such as: Heart disease. Diabetes. Long-term respiratory disease. Cancer. Pregnancy. You are immunocompromised. This means your body can't fight infections easily. You have a disability that makes it hard for you to move around, you have trouble moving, or you can't move at all. What are the signs or symptoms? People may have different symptoms from COVID-19. The symptoms can also be mild to very bad. They often show up in 5-6 days after being infected. But, they can take up to 14 days to appear. Common symptoms are: Cough. Feeling tired. New loss of taste or smell. Fever. Less common symptoms are: Sore throat. Headache. Body or muscle aches. Diarrhea. A skin rash or fingers or toes that are a different color than usual. Red or irritated eyes. Sometimes, COVID-19 does not cause symptoms. How is this diagnosed? COVID-19 can be diagnosed with tests done in the lab or at home. Fluid from your nose, mouth, or lungs will be used to check for the virus. How is this treated? Treatment for COVID-19 depends on how sick you are. Mild symptoms can be treated at home with rest, fluids, and  over-the-counter medicines. very bad symptoms may be treated in a hospital intensive care unit (ICU). If you have symptoms and are at risk of getting very sick, you may be given a medicine that fights viruses. This medicine is called an antiviral. How is this prevented? To protect yourself from COVID-19: Know your risk factors. Get vaccinated. If your body can't fight infections easily, talk to your provider about treatment to help prevent COVID-19. Stay at least about 3 feet (1 meter) away from other people. Wear mask that fits well when: You can't stay at a distance from people. You're in a place with not a lot of air flow. Try to be in open spaces with good air flow when you are in public. Wash your hands often or use an alcohol-based hand sanitizer. Cover your nose and mouth when you cough or sneeze. If you think you have COVID-19 or have been around someone who has it, stay home and away from other people as told by your provider or health officials. Where to find more information To learn more: Go to [employer].no Click Health Topics. Type COVID-19 in the search box. Go to VisitDestination.com.br Click Health Topics. Then click All Topics. Type COVID-19 in the search box. Get help right away if: You have trouble breathing or get short of breath. You have pain or pressure in your chest. You're feeling confused. These symptoms may be an emergency. Get help right away. Call 911. Do not wait to see if the symptoms will go away.  Do not drive yourself to the hospital. This information is not intended to replace advice given to you by your health care provider. Make sure you discuss any questions you have with your health care provider. Document Revised: 02/06/2023 Document Reviewed: 01/29/2023 Elsevier Patient Education  2025 [employer].

## 2024-02-07 NOTE — Progress Notes (Signed)
 Virtual Visit Consent   Joy Williams, you are scheduled for a virtual visit with a [facility] provider today. Just as with appointments in the office, your consent must be obtained to participate. Your consent will be active for this visit and any virtual visit you may have with one of our providers in the next 365 days. If you have a MyChart account, a copy of this consent can be sent to you electronically.  As this is a virtual visit, video technology does not allow for your provider to perform a traditional examination. This may limit your provider's ability to fully assess your condition. If your provider identifies any concerns that need to be evaluated in person or the need to arrange testing (such as labs, EKG, etc.), we will make arrangements to do so. Although advances in technology are sophisticated, we cannot ensure that it will always work on either your end or our end. If the connection with a video visit is poor, the visit may have to be switched to a telephone visit. With either a video or telephone visit, we are not always able to ensure that we have a secure connection.  By engaging in this virtual visit, you consent to the provision of healthcare and authorize for your insurance to be billed (if applicable) for the services provided during this visit. Depending on your insurance coverage, you may receive a charge related to this service.  I need to obtain your verbal consent now. Are you willing to proceed with your visit today? Joy Williams has provided verbal consent on 02/07/2024 for a virtual visit (video or telephone). Loa Lamp, FNP  Date: 02/07/2024 10:38 AM   Virtual Visit via Video Note   I, Loa Lamp, connected with  Joy Williams  ([id number], [birth year]) on 02/07/24 at 10:30 AM EDT by a video-enabled telemedicine application and verified that I am speaking with the correct person using two identifiers.  Location: Patient: Virtual Visit Location Patient:  Home Provider: Virtual Visit Location Provider: Home Office   I discussed the limitations of evaluation and management by telemedicine and the availability of in person appointments. The patient expressed understanding and agreed to proceed.    History of Present Illness: Joy Williams is a 73 y.o. who identifies as a female who was assigned female at birth, and is being seen today for head congestion,headache, fatigue, cough, tested positive yesterday. Started paxlovid  yesterday Sx for 2 days. No fever. Vaccine last Friday. Out of town last weekend. No wheezing or sob. She would like to discuss this.   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   Memory loss or impairment 04/27/2018   Hyperlipidemia LDL goal <70 03/12/2018   Age-related osteoporosis without current pathological fracture 02/20/2017   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   Tobacco abuse 01/22/2017   Vitamin D  insufficiency 01/22/2017   Estrogen deficiency 01/14/2017   Hypothyroidism 01/06/2017  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   Chronic obstructive pulmonary disease (HCC)    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   Raynaud's disease 01/19/2015   Malignant neoplasm of left lung (HCC) 12/30/2014   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) 12/06/2014   Severe mitral regurgitation 11/15/2014   Scleroderma (HCC) 10/03/2014   Asthmatic bronchitis , chronic (HCC) 10/03/2014   Multiple pulmonary nodules 10/03/2014   Current Cigarette smoker 10/03/2014    Allergies:  Allergies  Allergen Reactions   Penicillins Other (See Comments) and Hives    Unknown, occurred as a child  Unknown, occurred as a child, Has patient had a PCN reaction causing immediate rash, facial/tongue/throat swelling, SOB or lightheadedness with hypotension: Yes, Has patient had a PCN reaction causing severe rash involving mucus membranes or skin necrosis: No, Has patient had a PCN reaction that required hospitalization , Has patient had a PCN reaction occurring within the last 10 years: No, If all of the above answers are NO, then may proceed with Cephalosporin use., No   Bee Venom Swelling    Severe swelling at the sting site.   Ofev  [Nintedanib] Diarrhea   Medications:  Current Outpatient Medications:    acetaminophen  (TYLENOL ) 500 MG tablet, Take 1,000 mg by mouth every 6 (six) hours as needed for mild pain (pain score 1-3) or headache., Disp: , Rfl:    cephALEXin  (KEFLEX ) 500 MG capsule, Take 1 capsule (500 mg total) by mouth 4 (four) times daily. (Patient taking differently: Take 1 capsule (500 mg total) by mouth 4 (four) times daily.), Disp: 20 capsule, Rfl: 0   Cholecalciferol (VITAMIN D ) 125 MCG (5000 UT) CAPS, Take 5,000 Units by mouth daily., Disp: , Rfl:    clopidogrel  (PLAVIX ) 75 MG tablet, Take 1 tablet (75 mg total) by mouth daily., Disp: 90 tablet, Rfl: 2   EPINEPHrine  0.3 mg/0.3 mL IJ SOAJ injection, Inject 0.3 mg into the muscle as needed., Disp: 1 each, Rfl: 1   ferrous sulfate  325 (65 FE) MG tablet, Take 1 tablet (325 mg total) by mouth daily. (Patient taking differently: Take 325 mg by mouth daily. OTC), Disp: 90 tablet, Rfl: 3   fluticasone  (FLONASE ) 50 MCG/ACT nasal spray, Place 1 spray into both nostrils daily.,  Disp: , Rfl:    Fluticasone -Umeclidin-Vilant (TRELEGY ELLIPTA ) 100-62.5-25 MCG/ACT AEPB, Inhale 1 puff into the lungs daily., Disp: , Rfl:    Fluticasone -Umeclidin-Vilant (TRELEGY ELLIPTA ) 100-62.5-25 MCG/ACT AEPB, Inhale 1 puff into the lungs daily., Disp: , Rfl:    gabapentin  (NEURONTIN ) 300 MG capsule, TAKE 1 CAPSULE BY MOUTH TWICE A DAY, Disp: 60 capsule, Rfl: 2   hydrALAZINE  (APRESOLINE ) 25 MG tablet, Take 1 tablet (25 mg total) by mouth daily as needed (for systolic BP>150)., Disp: 30 tablet, Rfl: 9   ibuprofen (ADVIL) 200 MG tablet, Take 400 mg by mouth every 6 (six) hours as needed for moderate pain (pain score 4-6)., Disp: , Rfl:    lactulose  (CHRONULAC ) 10 GM/15ML solution, TAKE 45 MLS (30 GRAMS TOTAL) BY MOUTH 2 (TWO) TIMES DAILY AS NEEDED FOR MODERATE CONSTIPATION OR SEVERE CONSTIPATION., Disp: 90 mL, Rfl: 1   levothyroxine  (SYNTHROID ) 150 MCG tablet, Take 1 tablet (150 mcg total) by mouth daily., Disp: 90 tablet, Rfl: 3   lidocaine  (LIDODERM ) 5 %, Place 1 patch onto the skin daily. Remove & Discard patch within 12 hours or as directed by MD (Patient taking differently: Place 1 patch onto the skin daily. Remove & Discard  patch within 12 hours or as directed by MD), Disp: 30 patch, Rfl: 0   MELATONIN PO, Take 1 tablet by mouth at bedtime as needed (sleep)., Disp: , Rfl:    methocarbamol  (ROBAXIN ) 500 MG tablet, Take 1 tablet (500 mg total) by mouth every 6 (six) hours as needed for muscle spasms., Disp: 30 tablet, Rfl: 2   Multiple Vitamins-Minerals (MULTIVITAMIN WITH MINERALS) tablet, Take 1 tablet by mouth daily., Disp: , Rfl:    nirmatrelvir /ritonavir  (PAXLOVID ) 20 x 150 MG & 10 x 100MG  TABS, Take 3 tablets by mouth 2 (two) times daily for 5 days. (Take nirmatrelvir  150 mg two tablets twice daily for 5 days and ritonavir  100 mg one tablet twice daily for 5 days) Patient GFR is 93, Disp: 30 tablet, Rfl: 0   nitroGLYCERIN  (NITROSTAT ) 0.4 MG SL tablet, Place 1 tablet (0.4 mg total) under the  tongue every 5 (five) minutes as needed., Disp: 10 tablet, Rfl: 1   rosuvastatin  (CRESTOR ) 20 MG tablet, Take 1 tablet (20 mg total) by mouth every evening., Disp: 90 tablet, Rfl: 0   sennosides-docusate sodium  (SENOKOT-S) 8.6-50 MG tablet, Take 1 tablet by mouth daily. (Patient not taking: Reported on 01/29/2024), Disp: , Rfl:    TRELEGY ELLIPTA  100-62.5-25 MCG/ACT AEPB, INHALE 1 PUFF BY MOUTH EVERY DAY, Disp: 60 each, Rfl: 5   Turmeric 500 MG CAPS, Take 500 mg by mouth daily., Disp: , Rfl:    vitamin B-12 (CYANOCOBALAMIN ) 1000 MCG tablet, Take 1 tablet (1,000 mcg total) by mouth daily., Disp: , Rfl:    Zinc 30 MG TABS, Take 30 mg by mouth. (Patient not taking: Reported on 01/29/2024), Disp: , Rfl:   Observations/Objective: Patient is well-developed, well-nourished in no acute distress.  Resting comfortably  at home.  Head is normocephalic, atraumatic.  No labored breathing.  Speech is clear and coherent with logical content.  Patient is alert and oriented at baseline.    Assessment and Plan: 1. COVID (Primary)  Increase fluids humidifier at night, tylenol , mucinex , quarantine, she started paxlovid  yesterday, in no distress, rest, MVI with vit d and zinc. UC if sx worsen.   Follow Up Instructions: I discussed the assessment and treatment plan with the patient. The patient was provided an opportunity to ask questions and all were answered. The patient agreed with the plan and demonstrated an understanding of the instructions.  A copy of instructions were sent to the patient via MyChart unless otherwise noted below.    The patient was advised to call back or seek an in-person evaluation if the symptoms worsen or if the condition fails to improve as anticipated.    Coe Angelos, FNP

## 2024-02-09 ENCOUNTER — Telehealth: Payer: Self-pay | Admitting: *Deleted

## 2024-02-09 NOTE — Telephone Encounter (Signed)
 Called and spoke with patient, she states that she began having symptoms on Thursday 02/05/24 and tested positive on 02/06/24.  She is having head congestion, sweats at HS (taking tylenol ), body aches, HA, cough that started yesterday (02/08/24), mucous in her throat.  She says her taste if off.  She is eating and drinking ok.  She contacted the urgent care and was prescribed Paxlovid .  She is taking Pseudafed/Nyquil depending on what she needs.  She denies any fever, sore throat, increase in sob or drops in oxygen saturation.  She wanted to know what to look out for and when to contact us .  I advised her to call us  is she begins to have SOB worse than what is normal for her, drops in her oxygen saturations (less than 88-90%), if she is unable to eat or drink, develops a fever or any colored mucous or worsening cough.  I advised her to stay hydrated with water, Gatorade, etc.  I let her know to call the office, not to send a mychart message as it can take up to 48 hours for a response and her care could be delayed.  She verbalized understanding.  Nothing further needed.

## 2024-02-13 ENCOUNTER — Other Ambulatory Visit

## 2024-02-13 DIAGNOSIS — E871 Hypo-osmolality and hyponatremia: Secondary | ICD-10-CM

## 2024-02-13 DIAGNOSIS — E039 Hypothyroidism, unspecified: Secondary | ICD-10-CM

## 2024-02-17 ENCOUNTER — Ambulatory Visit
Admission: RE | Admit: 2024-02-17 | Discharge: 2024-02-17 | Disposition: A | Discharge status: Home / Self Care | Source: Ambulatory Visit | Attending: Family Medicine | Admitting: Family Medicine

## 2024-02-17 DIAGNOSIS — Z1231 Encounter for screening mammogram for malignant neoplasm of breast: Secondary | ICD-10-CM | POA: Diagnosis not present

## 2024-02-20 ENCOUNTER — Encounter: Payer: Self-pay | Admitting: Internal Medicine

## 2024-02-20 ENCOUNTER — Ambulatory Visit: Attending: Internal Medicine | Admitting: Internal Medicine

## 2024-02-20 ENCOUNTER — Ambulatory Visit: Admitting: Internal Medicine

## 2024-02-20 VITALS — BP 106/56 | HR 72 | Ht 66.0 in | Wt 143.1 lb

## 2024-02-20 DIAGNOSIS — E785 Hyperlipidemia, unspecified: Secondary | ICD-10-CM

## 2024-02-20 DIAGNOSIS — I340 Nonrheumatic mitral (valve) insufficiency: Secondary | ICD-10-CM

## 2024-02-20 DIAGNOSIS — I10 Essential (primary) hypertension: Secondary | ICD-10-CM

## 2024-02-20 DIAGNOSIS — Z952 Presence of prosthetic heart valve: Secondary | ICD-10-CM | POA: Diagnosis not present

## 2024-02-20 DIAGNOSIS — Z953 Presence of xenogenic heart valve: Secondary | ICD-10-CM | POA: Diagnosis not present

## 2024-02-20 DIAGNOSIS — I5181 Takotsubo syndrome: Secondary | ICD-10-CM | POA: Diagnosis not present

## 2024-02-20 NOTE — Patient Instructions (Signed)
 Medication Instructions:  Your physician recommends that you continue on your current medications as directed. Please refer to the Current Medication list given to you today.  *If you need a refill on your cardiac medications before your next appointment, please call your pharmacy*  Lab Work: NONE If you have labs (blood work) drawn today and your tests are completely normal, you will receive your results only by: MyChart Message (if you have MyChart) OR A paper copy in the mail If you have any lab test that is abnormal or we need to change your treatment, we will call you to review the results.  Testing/Procedures: ECHO Your physician has requested that you have an echocardiogram. Echocardiography is a painless test that uses sound waves to create images of your heart. It provides your doctor with information about the size and shape of your heart and how well your heart's chambers and valves are working. This procedure takes approximately one hour. There are no restrictions for this procedure. Please do NOT wear cologne, perfume, aftershave, or lotions (deodorant is allowed). Please arrive 15 minutes prior to your appointment time.  Please note: We ask at that you not bring children with you during ultrasound (echo/ vascular) testing. Due to room size and safety concerns, children are not allowed in the ultrasound rooms during exams. Our front office staff cannot provide observation of children in our lobby area while testing is being conducted. An adult accompanying a patient to their appointment will only be allowed in the ultrasound room at the discretion of the ultrasound technician under special circumstances. We apologize for any inconvenience.   Follow-Up: At [facility], you and your health needs are our priority.  As part of our continuing mission to provide you with exceptional heart care, our providers are all part of one team.  This team includes your primary Cardiologist  (physician) and Advanced Practice Providers or APPs (Physician Assistants and Nurse Practitioners) who all work together to provide you with the care you need, when you need it.  Your next appointment:   POST ECHO 1 YEAR  Provider:   Dr. Loni  We recommend signing up for the patient portal called MyChart.  Sign up information is provided on this After Visit Summary.  MyChart is used to connect with patients for Virtual Visits (Telemedicine).  Patients are able to view lab/test results, encounter notes, upcoming appointments, etc.  Non-urgent messages can be sent to your provider as well.   To learn more about what you can do with MyChart, go to [email or web address].   Other Instructions none

## 2024-02-20 NOTE — Progress Notes (Signed)
 Cardiology Office Note:  .   Date:  02/20/2024  ID:  Joy Williams, DOB 1950/11/21, MRN [id number] PCP: Chandra Toribio POUR, MD  [facility] Providers Cardiologist:  Soyla DELENA Merck, MD    History of Present Illness: .   Joy Williams is a 73 y.o. female with a hx of MVP s/p minimally invasive bioprosthetic MVR, carotid artery disease s/p R CEA, scleroderma, cold agglutinin disease, raynaud's disease, UIP, NSCLC, COPD, hypothyroidism and recent admission for STEMI found to have clean coronaries on cath with diagnosis of Takostubo CM discharged on 10/29/20 and readmission for dizziness on 11/03/20. Patient was admitted 02/24/19-02/25/19 with chest discomfort.  High-sensitivity troponin levels were elevated (1471 >> 1625 >> 1246 >> 1150).  There was concern for NSTEMI., however, cath demonstrated no CAD.  TTE demonstrated normal LV function and normally functioning mitral valve prosthesis.  She was followed by Dr. Mona in the hospital and he felt that her presentation was c/w [facility] (myocardial infarction with non-obstructive coronary arteries) or non-ACS troponin elevation.  The possible causes included myocarditis, coronary spasm, cardioembolic event. Underwent cardiac MRI that showed focal subendocardial LGE into mid inferolateral wall consistent with infarct seen on territory with concern for a thromboembolic event given clean cath. She was managed medically and worked with cardiac rehab at that time.   In November of 2020, she developed palpitations. 4 week zio placed with brief runs of SVT but no significant arrhythmias or Afib   Re-admitted to [facility] from 08/06/20-08/10/20 where she presented for acute onset dizziness, left arm incoordination and HA. CT head negative for acute abnormalities. MRI brain without acute infarct but showed multiple scattered remote lacunar infarcts. 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 TTE with EF 65 to 70%, NWMA, mild LVH, moderate left atrial size dilated, mild MVR, no source of emboli noted. She underwent right CEA on 08/09/20 with Dr. Sheree. She was continued on ASA, plavix  and statin.   Re-admitted again from 10/24/20-10/29/20 where she presented with chest pain and nausea found to have STE in I and aVL for which a STEMI was called. She was taken to the cath lab which showed 25% pLAD stenosis, otherwise normal coronary arteries, and EF 25-35% with LV gram c/w takotsubo cardiomyopathy. She was started on plavix . TTE showed EF 25-30%, global hypokinesis, G1DD, moderately elevated PA pressures, moderate LAE, and normal structure/function of mitral valve s/p repair/replacement. GDMT was limited due to hypotension and she was discharged on 10/29/20.   Re-presented to the ER on 11/03/20 with dizziness and orthostatic symptoms. Trop 41-->44 which was down from prior admission. Symptoms thought to be due to orthostasis and farxiga discontinued.   Saw Dr. Bensimhon on 11/06/20 where she was doing better. Dizziness improved. Bedside echo with improved LVEF.   She was seen 11/15/2020 where she was feeling much improved since last admission.  Repeat echo 11/22/2020 with LVEF returned to normal 60 to 65%, mild LVH, trivial central MR with no stenosis.   Seen 06/2021 where she was doing well from a CV standpoint. TTE 12/04/21 with LVEF 60-65%, Normal RV, moderate LAE, normal functioning MV prosthesis with mean gradient 2.44mmHG at HR 63bpm.   Has been followed by Pulmonary (Dr. 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  Prior history of lung cancer with  wedge resection.   Discussed the use of AI scribe software for clinical note transcription with the patient, who gave verbal consent to proceed.  History of Present Illness Joy Williams is a 73 year old female  with a history of mitral valve prolapse status post bioprosthetic mitral valve replacement who presents for follow-up.  She underwent a minimally invasive mitral valve replacement with an Edwards Magnum mitral bovine bioprosthetic 31 mm valve in 2018. Regular echocardiograms have been performed, with the most recent in August 2024. She monitors her blood pressure at home, which tends to run low, and manages it by increasing fluid intake. Hydralazine  is used as needed for elevated blood pressure, though rarely.  She has carotid artery disease and underwent a right carotid endarterectomy. She remains on clopidogrel , with recent evaluations indicating stability in the carotid artery.  She experiences occasional shortness of breath, which she attributes to her lung resection. Her medication regimen includes rosuvastatin  20 mg for cholesterol management. She manages low sodium levels by cautiously adding salt to her diet, balancing it to avoid swelling.  Her medical history includes COPD, hypothyroidism, scleroderma, cold agglutinin disease, Raynaud's disease, UIP, ILD, and previously diagnosed STEMI. She was found to have takotsubo cardiomyopathy with clean coronaries on catheterization.    ROS: negative except per HPI above.  Studies Reviewed: SABRA   EKG Interpretation Date/Time:  Friday February 20 2024 10:22:02 EDT Ventricular Rate:  72 PR Interval:  184 QRS Duration:  86 QT Interval:  422 QTC Calculation: 462 R Axis:   44  Text Interpretation: Normal sinus rhythm Nonspecific T wave abnormality Confirmed by Loni Rushing ([id number]) on 02/20/2024 10:47:49 AM    Results LABS Hyponatremia  DIAGNOSTIC Echocardiogram (August 2024): Normal systolic function, normal intracardiac pressures, valves appear normal EKG: Normal Risk Assessment/Calculations:       Physical Exam:   VS:  BP (!) 106/56   Pulse 72   Ht 5' 6 (1.676 m)   Wt 143 lb 1.6 oz (64.9 kg)   SpO2 98%   BMI 23.10 kg/m     Wt Readings from Last 3 Encounters:  02/20/24 143 lb 1.6 oz (64.9 kg)  01/29/24 141 lb (64 kg)  12/26/23 138 lb 6.4 oz (62.8 kg)     Physical Exam GENERAL: Alert, cooperative, well developed, no acute distress. HEENT: Normocephalic, normal oropharynx, moist mucous membranes. CHEST: Clear to auscultation bilaterally, no wheezes, rhonchi, or crackles. CARDIOVASCULAR: Normal heart rate and rhythm, S1 and S2 normal without murmurs. ABDOMEN: Soft, non-tender, non-distended, without organomegaly, normal bowel sounds. EXTREMITIES: No cyanosis or edema. NEUROLOGICAL: Cranial nerves grossly intact, moves all extremities without gross motor or sensory deficit.   ASSESSMENT AND PLAN: .    Assessment and Plan Assessment & Plan Bioprosthetic mitral valve replacement (2018) Stable function with no significant changes. August 2024 echocardiogram showed good cardiac function and well-functioning valve. - Schedule echocardiogram in one year to monitor mitral valve function. If stable, can space out follow up echos.   Carotid artery disease status post right carotid endarterectomy Stable post-right carotid endarterectomy with no significant changes. - Continue clopidogrel  75 mg daily for carotid artery disease management, management and recommendations per Vascular Surgery.  HLD - continue crestor  20 mg daily, lipids optimized  Essential hypertension Blood pressure generally low, not requiring routine medication. Managed with increased fluid intake during hypotensive episodes. - Advise to increase fluid intake during episodes of low blood pressure.        Rushing Loni, MD, FACC

## 2024-02-23 ENCOUNTER — Other Ambulatory Visit

## 2024-02-23 DIAGNOSIS — E871 Hypo-osmolality and hyponatremia: Secondary | ICD-10-CM | POA: Diagnosis not present

## 2024-02-23 DIAGNOSIS — E039 Hypothyroidism, unspecified: Secondary | ICD-10-CM | POA: Diagnosis not present

## 2024-02-24 ENCOUNTER — Encounter: Payer: Self-pay | Admitting: Internal Medicine

## 2024-02-24 ENCOUNTER — Ambulatory Visit: Payer: Self-pay

## 2024-02-24 LAB — BASIC METABOLIC PANEL WITH GFR
BUN/Creatinine Ratio: 14 (ref 12–28)
BUN: 11 mg/dL (ref 8–27)
CO2: 23 mmol/L (ref 20–29)
Calcium: 9.1 mg/dL (ref 8.7–10.3)
Chloride: 91 mmol/L — ABNORMAL LOW (ref 96–106)
Creatinine, Ser: 0.81 mg/dL (ref 0.57–1.00)
Glucose: 63 mg/dL — ABNORMAL LOW (ref 70–99)
Potassium: 4.1 mmol/L (ref 3.5–5.2)
Sodium: 130 mmol/L — ABNORMAL LOW (ref 134–144)
eGFR: 77 mL/min/1.73 (ref >59)

## 2024-02-24 LAB — TSH: TSH: 12.9 u[IU]/mL — ABNORMAL HIGH (ref 0.450–4.500)

## 2024-02-24 NOTE — Progress Notes (Signed)
 Called patient LVM to contact the office if pt call please advised

## 2024-02-25 MED ORDER — TRELEGY ELLIPTA 100-62.5-25 MCG/ACT IN AEPB: INHALATION_SPRAY | RESPIRATORY_TRACT | 3 refills | Status: AC

## 2024-02-25 NOTE — Progress Notes (Signed)
 Called patient LVM to contact the office if pt calls please advised

## 2024-02-26 NOTE — Progress Notes (Signed)
 Called pt she is advised of the change in her medication

## 2024-03-13 ENCOUNTER — Other Ambulatory Visit: Payer: Self-pay | Admitting: Family Medicine

## 2024-03-13 DIAGNOSIS — E039 Hypothyroidism, unspecified: Secondary | ICD-10-CM

## 2024-03-17 DIAGNOSIS — J948 Other specified pleural conditions: Secondary | ICD-10-CM | POA: Diagnosis not present

## 2024-03-17 DIAGNOSIS — Z87891 Personal history of nicotine dependence: Secondary | ICD-10-CM | POA: Diagnosis not present

## 2024-03-17 DIAGNOSIS — C3482 Malignant neoplasm of overlapping sites of left bronchus and lung: Secondary | ICD-10-CM | POA: Diagnosis not present

## 2024-04-13 ENCOUNTER — Other Ambulatory Visit: Payer: Self-pay | Admitting: Family Medicine

## 2024-04-13 DIAGNOSIS — E039 Hypothyroidism, unspecified: Secondary | ICD-10-CM

## 2024-04-13 DIAGNOSIS — E871 Hypo-osmolality and hyponatremia: Secondary | ICD-10-CM

## 2024-04-15 ENCOUNTER — Other Ambulatory Visit: Payer: Self-pay | Admitting: Internal Medicine

## 2024-04-17 ENCOUNTER — Encounter: Payer: Self-pay | Admitting: Internal Medicine

## 2024-04-19 ENCOUNTER — Other Ambulatory Visit

## 2024-04-19 DIAGNOSIS — E039 Hypothyroidism, unspecified: Secondary | ICD-10-CM

## 2024-04-19 DIAGNOSIS — E871 Hypo-osmolality and hyponatremia: Secondary | ICD-10-CM

## 2024-04-19 MED ORDER — CLOPIDOGREL BISULFATE 75 MG PO TABS: 75.0000 mg | ORAL_TABLET | Freq: Every day | ORAL | 3 refills | Status: AC

## 2024-04-26 ENCOUNTER — Ambulatory Visit: Admitting: Family Medicine

## 2024-05-05 LAB — BASIC METABOLIC PANEL WITH GFR
BUN/Creatinine Ratio: 22 (ref 12–28)
BUN: 14 mg/dL (ref 8–27)
CO2: 24 mmol/L (ref 20–29)
Calcium: 9.4 mg/dL (ref 8.7–10.3)
Chloride: 93 mmol/L — ABNORMAL LOW (ref 96–106)
Creatinine, Ser: 0.65 mg/dL (ref 0.57–1.00)
Glucose: 88 mg/dL (ref 70–99)
Potassium: 4.2 mmol/L (ref 3.5–5.2)
Sodium: 131 mmol/L — ABNORMAL LOW (ref 134–144)
eGFR: 93 mL/min/1.73 (ref >59)

## 2024-05-05 LAB — OSMOLALITY: Osmolality Meas: 266 mosm/kg — ABNORMAL LOW (ref 280–301)

## 2024-05-05 LAB — TSH: TSH: 1.88 u[IU]/mL (ref 0.450–4.500)

## 2024-05-07 ENCOUNTER — Other Ambulatory Visit: Payer: Self-pay | Admitting: Nurse Practitioner

## 2024-05-07 DIAGNOSIS — N83202 Unspecified ovarian cyst, left side: Secondary | ICD-10-CM

## 2024-05-17 ENCOUNTER — Ambulatory Visit: Payer: Self-pay | Admitting: Family Medicine

## 2024-05-23 ENCOUNTER — Encounter: Payer: Self-pay | Admitting: Internal Medicine

## 2024-05-23 DIAGNOSIS — J849 Interstitial pulmonary disease, unspecified: Secondary | ICD-10-CM

## 2024-05-24 ENCOUNTER — Ambulatory Visit (INDEPENDENT_AMBULATORY_CARE_PROVIDER_SITE_OTHER): Admitting: Family Medicine

## 2024-05-24 ENCOUNTER — Encounter: Payer: Self-pay | Admitting: Family Medicine

## 2024-05-24 VITALS — BP 132/76 | HR 66 | Ht 66.0 in | Wt 147.1 lb

## 2024-05-24 DIAGNOSIS — D5912 Cold autoimmune hemolytic anemia: Secondary | ICD-10-CM

## 2024-05-24 DIAGNOSIS — E039 Hypothyroidism, unspecified: Secondary | ICD-10-CM | POA: Diagnosis not present

## 2024-05-24 DIAGNOSIS — J849 Interstitial pulmonary disease, unspecified: Secondary | ICD-10-CM | POA: Diagnosis not present

## 2024-05-24 DIAGNOSIS — E871 Hypo-osmolality and hyponatremia: Secondary | ICD-10-CM

## 2024-05-24 DIAGNOSIS — Z23 Encounter for immunization: Secondary | ICD-10-CM | POA: Diagnosis not present

## 2024-05-24 DIAGNOSIS — I252 Old myocardial infarction: Secondary | ICD-10-CM | POA: Diagnosis not present

## 2024-05-24 DIAGNOSIS — E785 Hyperlipidemia, unspecified: Secondary | ICD-10-CM

## 2024-05-24 DIAGNOSIS — J449 Chronic obstructive pulmonary disease, unspecified: Secondary | ICD-10-CM

## 2024-05-24 DIAGNOSIS — Z953 Presence of xenogenic heart valve: Secondary | ICD-10-CM | POA: Diagnosis not present

## 2024-05-24 NOTE — Assessment & Plan Note (Signed)
 Continues to follow with cardiology regularly.  On plavix .

## 2024-05-24 NOTE — Assessment & Plan Note (Addendum)
 Continue plavix  and crestor .  Recheck lipid panel at next visit.

## 2024-05-24 NOTE — Patient Instructions (Signed)
 It was nice to see you today,  We addressed the following topics today: - your labs are stable.  Continue to take the current medications.  - we will recheck your labs before your visit in 6 months   Have a great day,  Rolan Slain, MD

## 2024-05-24 NOTE — Assessment & Plan Note (Signed)
 Discussed importance of continuing crestor  20mg  for secondary prophylaxis of ASCVD.

## 2024-05-24 NOTE — Progress Notes (Signed)
" ° °  Established Patient Office Visit  Subjective   Patient ID: Joy Williams, female    DOB: [birth year]  Age: 74 y.o. MRN: [id number]  Chief Complaint  Patient presents with   Medical Management of Chronic Issues    HPI  Pt here to discuss recent labs and f/u regarding her other chronic issues.   - most recent ct scan at [facility] was not concerning.  She was advised to continue routine f/u with her pulmonologist.   - has seen cardiology recently w/ no changes to her medications.  - continues to take trelegy inhaler and is no longer smoking - continues to take crestor  for secondary prevention of ascvd, plavix , and not currently on any bp medications with well controlled bp.   - taking levothyroxine  for hypothyroid.  Discussed recent normal tsh.  - takes b12 and turmeric.  - discussed her hyponatremia, which is stable and potential causes including pulmonary disease.    The ASCVD Risk score (Arnett DK, et al., 2019) failed to calculate for the following reasons:   Risk score cannot be calculated because patient has a medical history suggesting prior/existing ASCVD   * - Cholesterol units were assumed  Health Maintenance Due  Topic Date Due   Colonoscopy  Never done   Pneumococcal Vaccine: 50+ Years (2 of 2 - PPSV23, PCV20, or PCV21) 03/19/2017   Medicare Annual Wellness (AWV)  09/04/2023      Objective:     BP 132/76   Pulse 66   Ht 5' 6 (1.676 m)   Wt 147 lb 1.9 oz (66.7 kg)   SpO2 99%   BMI 23.75 kg/m    Physical Exam Gen: alert, oriented Cv: rrr, no murmur Pulm: no respiratory distress. No wheezing.  Psych: pleasant affect   No results found for any visits on 05/24/24.      Assessment & Plan:   Chronic obstructive pulmonary disease, unspecified COPD type (HCC) Assessment & Plan: Discussed w/ pt needing to continue trelegy.  She no longer smokes.  Follows with pulm regularly    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HCC) Assessment & Plan: First noted in 2018, pt  is currently not on any treatment for this.  Most recent hgb/hct have been stable/low normal.  If pt receives blood transfusions will need to be pre-warmed.    Hyperlipidemia LDL goal <70 Assessment & Plan: Discussed importance of continuing crestor  20mg  for secondary prophylaxis of ASCVD.    Hyponatremia Assessment & Plan: Mild, chronic, stable w/ low serum osm < 270.  Will repeat at next visit with urine testing. Likely related to her pulmonary disease.  Will continue to monitor.    History of MI (myocardial infarction) Assessment & Plan: Continue plavix  and crestor .  Recheck lipid panel at next visit.    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 Assessment & Plan: Continues to follow with cardiology regularly.  On plavix .    ILD (interstitial lung disease) (HCC) Assessment & Plan: Continue f/u w/ pulm. Not on antifibrotics or immune modulators   Other orders -     Flu vaccine HIGH DOSE PF(Fluzone Trivalent)     Return in about 6 months (around 11/21/2024) for HTN, hld.    Joy MARLA Slain, MD "

## 2024-05-24 NOTE — Assessment & Plan Note (Signed)
 Mild, chronic, stable w/ low serum osm < 270.  Will repeat at next visit with urine testing. Likely related to her pulmonary disease.  Will continue to monitor.

## 2024-05-24 NOTE — Assessment & Plan Note (Signed)
 First noted in 2018, pt is currently not on any treatment for this.  Most recent hgb/hct have been stable/low normal.  If pt receives blood transfusions will need to be pre-warmed.

## 2024-05-24 NOTE — Assessment & Plan Note (Signed)
 Discussed w/ pt needing to continue trelegy.  She no longer smokes.  Follows with pulm regularly

## 2024-05-25 DIAGNOSIS — J849 Interstitial pulmonary disease, unspecified: Secondary | ICD-10-CM | POA: Insufficient documentation

## 2024-05-25 NOTE — Telephone Encounter (Signed)
" °  Okay for [employer].  1 inhaler puff twice daily -please send prescription  Also arrange for spirometry and DLCO in February or March 2026 and ensure follow-up      Latest Ref Rng & Units 01/29/2024    9:23 AM 02/18/2023    9:55 AM 11/19/2022    1:11 PM 03/18/2022    1:47 PM 02/15/2021    2:49 PM 08/05/2019   10:55 AM 03/12/2016   10:26 AM  PFT Results  FVC-Pre L 1.95  2.26  2.33  2.48  2.35  2.52  2.67   FVC-Predicted Pre % 64  71  72  77  71  74  76   FVC-Post L     2.41  2.52  2.61   FVC-Predicted Post %     72  74  75   Pre FEV1/FVC % % 77  80  78  81  78  81  78   Post FEV1/FCV % %     81  82  80   FEV1-Pre L 1.50  1.81  1.82  2.00  1.84  2.04  2.09   FEV1-Predicted Pre % 65  75  75  82  73  79  78   FEV1-Post L     1.96  2.06  2.09   DLCO uncorrected ml/min/mmHg 9.76  11.76  11.03  13.70  12.17  13.77  13.52   DLCO UNC% % 48  56  53  66  57  64  48   DLCO corrected ml/min/mmHg 10.38  11.95  11.03  13.70  12.17  13.77  14.73   DLCO COR %Predicted % 51  57  53  66  57  64  53   DLVA Predicted % 91  81  82  83  86  87  84   TLC L     3.83  4.22  4.18   TLC % Predicted %     70  77  77   RV % Predicted %     64  74  71     "

## 2024-05-25 NOTE — Assessment & Plan Note (Signed)
 Continue f/u w/ pulm. Not on antifibrotics or immune modulators

## 2024-05-25 NOTE — Telephone Encounter (Signed)
 Dr. Geronimo, Please see patient note regarding inhaler and cost.  She is requesting a prescription for Wixela. I have already addressed the issue with her follow up with her and asked her to call the office to schedule follow up in March per your LOV note with Spirometry.  Thank you.

## 2024-05-26 NOTE — Telephone Encounter (Signed)
 Attempted to call patient. Left voicemail for patent to call office to get scheduled for PFT and follow up appointment.

## 2024-06-01 ENCOUNTER — Encounter: Payer: Self-pay | Admitting: Physician Assistant

## 2024-06-10 ENCOUNTER — Encounter: Payer: Self-pay | Admitting: Internal Medicine

## 2024-06-10 ENCOUNTER — Encounter

## 2024-06-29 ENCOUNTER — Encounter

## 2024-07-12 ENCOUNTER — Other Ambulatory Visit

## 2024-07-27 ENCOUNTER — Encounter

## 2024-08-19 ENCOUNTER — Ambulatory Visit

## 2024-09-30 ENCOUNTER — Ambulatory Visit: Admitting: Internal Medicine

## 2024-11-23 ENCOUNTER — Ambulatory Visit: Admitting: Family Medicine
